# Patient Record
Sex: Female | Born: 1935 | Race: White | Hispanic: No | Smoking: Never smoker
Health system: Southern US, Community
[De-identification: ages and names within clinical notes are randomized; demographics above are authoritative.]

## PROBLEM LIST (undated history)

## (undated) DIAGNOSIS — I509 Heart failure, unspecified: Secondary | ICD-10-CM

## (undated) DIAGNOSIS — J45909 Unspecified asthma, uncomplicated: Secondary | ICD-10-CM

## (undated) DIAGNOSIS — M419 Scoliosis, unspecified: Secondary | ICD-10-CM

## (undated) DIAGNOSIS — R42 Dizziness and giddiness: Secondary | ICD-10-CM

## (undated) DIAGNOSIS — I4891 Unspecified atrial fibrillation: Secondary | ICD-10-CM

## (undated) DIAGNOSIS — I639 Cerebral infarction, unspecified: Secondary | ICD-10-CM

## (undated) DIAGNOSIS — I6529 Occlusion and stenosis of unspecified carotid artery: Secondary | ICD-10-CM

## (undated) DIAGNOSIS — D649 Anemia, unspecified: Secondary | ICD-10-CM

## (undated) DIAGNOSIS — F32A Depression, unspecified: Secondary | ICD-10-CM

## (undated) DIAGNOSIS — F0390 Unspecified dementia without behavioral disturbance: Secondary | ICD-10-CM

## (undated) DIAGNOSIS — R2689 Other abnormalities of gait and mobility: Secondary | ICD-10-CM

## (undated) DIAGNOSIS — I350 Nonrheumatic aortic (valve) stenosis: Secondary | ICD-10-CM

## (undated) DIAGNOSIS — I10 Essential (primary) hypertension: Secondary | ICD-10-CM

## (undated) DIAGNOSIS — F329 Major depressive disorder, single episode, unspecified: Secondary | ICD-10-CM

## (undated) DIAGNOSIS — N189 Chronic kidney disease, unspecified: Secondary | ICD-10-CM

## (undated) DIAGNOSIS — F419 Anxiety disorder, unspecified: Secondary | ICD-10-CM

## (undated) DIAGNOSIS — I219 Acute myocardial infarction, unspecified: Secondary | ICD-10-CM

## (undated) DIAGNOSIS — D509 Iron deficiency anemia, unspecified: Secondary | ICD-10-CM

## (undated) DIAGNOSIS — I2510 Atherosclerotic heart disease of native coronary artery without angina pectoris: Secondary | ICD-10-CM

## (undated) DIAGNOSIS — N289 Disorder of kidney and ureter, unspecified: Secondary | ICD-10-CM

## (undated) DIAGNOSIS — M1990 Unspecified osteoarthritis, unspecified site: Secondary | ICD-10-CM

## (undated) DIAGNOSIS — K219 Gastro-esophageal reflux disease without esophagitis: Secondary | ICD-10-CM

## (undated) DIAGNOSIS — H547 Unspecified visual loss: Secondary | ICD-10-CM

## (undated) HISTORY — PX: KNEE SURGERY: SHX244

## (undated) HISTORY — DX: Essential (primary) hypertension: I10

## (undated) HISTORY — DX: Atherosclerotic heart disease of native coronary artery without angina pectoris: I25.10

## (undated) HISTORY — PX: APPENDECTOMY: SHX54

## (undated) HISTORY — DX: Nonrheumatic aortic (valve) stenosis: I35.0

## (undated) HISTORY — DX: Dizziness and giddiness: R42

## (undated) HISTORY — DX: Occlusion and stenosis of unspecified carotid artery: I65.29

## (undated) HISTORY — DX: Chronic kidney disease, unspecified: N18.9

## (undated) HISTORY — PX: CORONARY ANGIOPLASTY: SHX604

## (undated) HISTORY — PX: PACEMAKER INSERTION: SHX728

## (undated) HISTORY — DX: Iron deficiency anemia, unspecified: D50.9

## (undated) HISTORY — DX: Disorder of kidney and ureter, unspecified: N28.9

## (undated) HISTORY — DX: Cerebral infarction, unspecified: I63.9

## (undated) HISTORY — PX: TOTAL ABDOMINAL HYSTERECTOMY: SHX209

## (undated) HISTORY — PX: CHOLECYSTECTOMY: SHX55

## (undated) HISTORY — DX: Anemia, unspecified: D64.9

## (undated) HISTORY — PX: CATARACT EXTRACTION: SUR2

---

## 1998-06-03 ENCOUNTER — Other Ambulatory Visit: Admission: RE | Admit: 1998-06-03 | Discharge: 1998-06-03 | Payer: Self-pay

## 1998-11-21 ENCOUNTER — Inpatient Hospital Stay (HOSPITAL_COMMUNITY): Admission: EM | Admit: 1998-11-21 | Discharge: 1998-11-23 | Payer: Self-pay | Admitting: Emergency Medicine

## 1998-11-21 ENCOUNTER — Encounter: Payer: Self-pay | Admitting: Cardiology

## 1998-12-09 ENCOUNTER — Encounter: Payer: Self-pay | Admitting: Emergency Medicine

## 1998-12-09 ENCOUNTER — Observation Stay (HOSPITAL_COMMUNITY): Admission: EM | Admit: 1998-12-09 | Discharge: 1998-12-10 | Payer: Self-pay | Admitting: Emergency Medicine

## 1998-12-10 ENCOUNTER — Encounter: Payer: Self-pay | Admitting: Cardiology

## 1999-01-10 ENCOUNTER — Encounter: Payer: Self-pay | Admitting: Emergency Medicine

## 1999-01-10 ENCOUNTER — Inpatient Hospital Stay (HOSPITAL_COMMUNITY): Admission: EM | Admit: 1999-01-10 | Discharge: 1999-01-15 | Payer: Self-pay | Admitting: Emergency Medicine

## 1999-01-11 ENCOUNTER — Encounter: Payer: Self-pay | Admitting: Pulmonary Disease

## 1999-06-01 ENCOUNTER — Encounter: Payer: Self-pay | Admitting: Emergency Medicine

## 1999-06-01 ENCOUNTER — Inpatient Hospital Stay (HOSPITAL_COMMUNITY): Admission: EM | Admit: 1999-06-01 | Discharge: 1999-06-06 | Payer: Self-pay | Admitting: Emergency Medicine

## 1999-06-08 ENCOUNTER — Encounter: Payer: Self-pay | Admitting: Emergency Medicine

## 1999-06-08 ENCOUNTER — Inpatient Hospital Stay (HOSPITAL_COMMUNITY): Admission: EM | Admit: 1999-06-08 | Discharge: 1999-06-09 | Payer: Self-pay | Admitting: Emergency Medicine

## 1999-06-09 ENCOUNTER — Encounter: Payer: Self-pay | Admitting: Cardiovascular Disease

## 1999-06-13 ENCOUNTER — Emergency Department (HOSPITAL_COMMUNITY): Admission: EM | Admit: 1999-06-13 | Discharge: 1999-06-13 | Payer: Self-pay | Admitting: Emergency Medicine

## 1999-06-13 ENCOUNTER — Encounter: Payer: Self-pay | Admitting: Emergency Medicine

## 1999-09-14 ENCOUNTER — Encounter: Payer: Self-pay | Admitting: Emergency Medicine

## 1999-09-14 ENCOUNTER — Inpatient Hospital Stay (HOSPITAL_COMMUNITY): Admission: EM | Admit: 1999-09-14 | Discharge: 1999-09-14 | Payer: Self-pay | Admitting: Emergency Medicine

## 2000-02-26 ENCOUNTER — Ambulatory Visit (HOSPITAL_COMMUNITY): Admission: RE | Admit: 2000-02-26 | Discharge: 2000-02-27 | Payer: Self-pay | Admitting: *Deleted

## 2000-02-29 ENCOUNTER — Inpatient Hospital Stay (HOSPITAL_COMMUNITY): Admission: EM | Admit: 2000-02-29 | Discharge: 2000-03-02 | Payer: Self-pay | Admitting: Emergency Medicine

## 2000-02-29 ENCOUNTER — Encounter: Payer: Self-pay | Admitting: Emergency Medicine

## 2000-03-26 ENCOUNTER — Encounter: Payer: Self-pay | Admitting: General Surgery

## 2000-03-30 ENCOUNTER — Encounter (INDEPENDENT_AMBULATORY_CARE_PROVIDER_SITE_OTHER): Payer: Self-pay | Admitting: Specialist

## 2000-03-30 ENCOUNTER — Encounter: Payer: Self-pay | Admitting: General Surgery

## 2000-03-30 ENCOUNTER — Ambulatory Visit (HOSPITAL_COMMUNITY): Admission: RE | Admit: 2000-03-30 | Discharge: 2000-03-31 | Payer: Self-pay | Admitting: General Surgery

## 2000-08-05 ENCOUNTER — Encounter: Payer: Self-pay | Admitting: *Deleted

## 2000-08-05 ENCOUNTER — Inpatient Hospital Stay (HOSPITAL_COMMUNITY): Admission: EM | Admit: 2000-08-05 | Discharge: 2000-08-07 | Payer: Self-pay | Admitting: Podiatry

## 2001-01-15 ENCOUNTER — Inpatient Hospital Stay (HOSPITAL_COMMUNITY): Admission: EM | Admit: 2001-01-15 | Discharge: 2001-01-18 | Payer: Self-pay | Admitting: Emergency Medicine

## 2001-01-15 ENCOUNTER — Encounter: Payer: Self-pay | Admitting: Emergency Medicine

## 2001-01-17 ENCOUNTER — Encounter: Payer: Self-pay | Admitting: *Deleted

## 2001-05-25 ENCOUNTER — Emergency Department (HOSPITAL_COMMUNITY): Admission: EM | Admit: 2001-05-25 | Discharge: 2001-05-25 | Payer: Self-pay | Admitting: *Deleted

## 2001-05-25 ENCOUNTER — Encounter: Payer: Self-pay | Admitting: *Deleted

## 2001-07-19 ENCOUNTER — Ambulatory Visit (HOSPITAL_COMMUNITY): Admission: RE | Admit: 2001-07-19 | Discharge: 2001-07-19 | Payer: Self-pay | Admitting: Gastroenterology

## 2001-07-19 ENCOUNTER — Encounter (INDEPENDENT_AMBULATORY_CARE_PROVIDER_SITE_OTHER): Payer: Self-pay | Admitting: Specialist

## 2001-09-17 ENCOUNTER — Emergency Department (HOSPITAL_COMMUNITY): Admission: EM | Admit: 2001-09-17 | Discharge: 2001-09-17 | Payer: Self-pay | Admitting: Emergency Medicine

## 2001-09-17 ENCOUNTER — Inpatient Hospital Stay (HOSPITAL_COMMUNITY): Admission: AD | Admit: 2001-09-17 | Discharge: 2001-09-23 | Payer: Self-pay | Admitting: Cardiovascular Disease

## 2001-09-17 ENCOUNTER — Encounter: Payer: Self-pay | Admitting: Emergency Medicine

## 2002-01-21 ENCOUNTER — Encounter: Payer: Self-pay | Admitting: *Deleted

## 2002-01-21 ENCOUNTER — Emergency Department (HOSPITAL_COMMUNITY): Admission: EM | Admit: 2002-01-21 | Discharge: 2002-01-21 | Payer: Self-pay | Admitting: *Deleted

## 2002-05-06 ENCOUNTER — Encounter: Payer: Self-pay | Admitting: Emergency Medicine

## 2002-05-06 ENCOUNTER — Inpatient Hospital Stay (HOSPITAL_COMMUNITY): Admission: EM | Admit: 2002-05-06 | Discharge: 2002-05-10 | Payer: Self-pay | Admitting: Emergency Medicine

## 2002-05-07 ENCOUNTER — Encounter: Payer: Self-pay | Admitting: Internal Medicine

## 2002-05-08 ENCOUNTER — Encounter: Payer: Self-pay | Admitting: Internal Medicine

## 2002-05-09 ENCOUNTER — Encounter: Payer: Self-pay | Admitting: Internal Medicine

## 2002-07-04 ENCOUNTER — Ambulatory Visit (HOSPITAL_COMMUNITY): Admission: RE | Admit: 2002-07-04 | Discharge: 2002-07-04 | Payer: Self-pay | Admitting: Internal Medicine

## 2002-07-04 ENCOUNTER — Encounter: Payer: Self-pay | Admitting: Internal Medicine

## 2002-10-14 ENCOUNTER — Encounter: Payer: Self-pay | Admitting: *Deleted

## 2002-10-14 ENCOUNTER — Inpatient Hospital Stay (HOSPITAL_COMMUNITY): Admission: EM | Admit: 2002-10-14 | Discharge: 2002-10-15 | Payer: Self-pay | Admitting: Emergency Medicine

## 2003-09-05 ENCOUNTER — Emergency Department (HOSPITAL_COMMUNITY): Admission: EM | Admit: 2003-09-05 | Discharge: 2003-09-06 | Payer: Self-pay | Admitting: Emergency Medicine

## 2003-09-06 ENCOUNTER — Encounter: Payer: Self-pay | Admitting: Emergency Medicine

## 2003-12-08 HISTORY — PX: COLONOSCOPY: SHX174

## 2004-07-28 ENCOUNTER — Ambulatory Visit (HOSPITAL_COMMUNITY): Admission: RE | Admit: 2004-07-28 | Discharge: 2004-07-28 | Payer: Self-pay | Admitting: Neurology

## 2004-09-12 ENCOUNTER — Ambulatory Visit (HOSPITAL_COMMUNITY): Admission: RE | Admit: 2004-09-12 | Discharge: 2004-09-12 | Payer: Self-pay | Admitting: Gastroenterology

## 2004-10-20 ENCOUNTER — Other Ambulatory Visit: Admission: RE | Admit: 2004-10-20 | Discharge: 2004-10-20 | Payer: Self-pay | Admitting: Family Medicine

## 2004-12-07 HISTORY — PX: CORONARY ARTERY BYPASS GRAFT: SHX141

## 2005-02-25 ENCOUNTER — Ambulatory Visit: Payer: Self-pay | Admitting: Internal Medicine

## 2005-02-25 ENCOUNTER — Inpatient Hospital Stay (HOSPITAL_COMMUNITY): Admission: EM | Admit: 2005-02-25 | Discharge: 2005-02-28 | Payer: Self-pay | Admitting: Emergency Medicine

## 2005-03-09 ENCOUNTER — Emergency Department (HOSPITAL_COMMUNITY): Admission: EM | Admit: 2005-03-09 | Discharge: 2005-03-09 | Payer: Self-pay | Admitting: Emergency Medicine

## 2005-03-13 ENCOUNTER — Inpatient Hospital Stay (HOSPITAL_COMMUNITY): Admission: RE | Admit: 2005-03-13 | Discharge: 2005-03-20 | Payer: Self-pay | Admitting: Cardiothoracic Surgery

## 2005-04-13 ENCOUNTER — Encounter: Admission: RE | Admit: 2005-04-13 | Discharge: 2005-05-05 | Payer: Self-pay | Admitting: Orthopedic Surgery

## 2005-04-13 ENCOUNTER — Ambulatory Visit: Payer: Self-pay | Admitting: *Deleted

## 2005-05-04 ENCOUNTER — Emergency Department (HOSPITAL_COMMUNITY): Admission: EM | Admit: 2005-05-04 | Discharge: 2005-05-04 | Payer: Self-pay | Admitting: Emergency Medicine

## 2005-05-21 ENCOUNTER — Encounter: Admission: RE | Admit: 2005-05-21 | Discharge: 2005-05-21 | Payer: Self-pay | Admitting: Cardiothoracic Surgery

## 2005-06-27 ENCOUNTER — Emergency Department (HOSPITAL_COMMUNITY): Admission: EM | Admit: 2005-06-27 | Discharge: 2005-06-27 | Payer: Self-pay | Admitting: Emergency Medicine

## 2005-08-17 ENCOUNTER — Ambulatory Visit: Payer: Self-pay | Admitting: Cardiology

## 2006-07-28 ENCOUNTER — Ambulatory Visit: Payer: Self-pay | Admitting: Cardiology

## 2006-08-30 ENCOUNTER — Ambulatory Visit: Payer: Self-pay

## 2007-09-21 ENCOUNTER — Ambulatory Visit: Payer: Self-pay | Admitting: Cardiology

## 2007-12-08 ENCOUNTER — Emergency Department (HOSPITAL_COMMUNITY): Admission: EM | Admit: 2007-12-08 | Discharge: 2007-12-08 | Payer: Self-pay | Admitting: Emergency Medicine

## 2008-04-19 ENCOUNTER — Inpatient Hospital Stay (HOSPITAL_COMMUNITY): Admission: EM | Admit: 2008-04-19 | Discharge: 2008-04-20 | Payer: Self-pay | Admitting: Emergency Medicine

## 2008-10-24 ENCOUNTER — Ambulatory Visit: Payer: Self-pay | Admitting: Cardiology

## 2008-11-08 ENCOUNTER — Ambulatory Visit: Payer: Self-pay

## 2008-12-20 ENCOUNTER — Ambulatory Visit (HOSPITAL_COMMUNITY): Admission: RE | Admit: 2008-12-20 | Discharge: 2008-12-20 | Payer: Self-pay | Admitting: Family Medicine

## 2009-01-14 ENCOUNTER — Ambulatory Visit (HOSPITAL_COMMUNITY): Admission: RE | Admit: 2009-01-14 | Discharge: 2009-01-14 | Payer: Self-pay | Admitting: Family Medicine

## 2009-08-14 ENCOUNTER — Encounter (INDEPENDENT_AMBULATORY_CARE_PROVIDER_SITE_OTHER): Payer: Self-pay | Admitting: *Deleted

## 2009-08-15 ENCOUNTER — Encounter
Admission: RE | Admit: 2009-08-15 | Discharge: 2009-08-27 | Payer: Self-pay | Admitting: Physical Medicine & Rehabilitation

## 2009-08-19 ENCOUNTER — Ambulatory Visit: Payer: Self-pay | Admitting: Physical Medicine & Rehabilitation

## 2009-08-22 ENCOUNTER — Ambulatory Visit: Payer: Self-pay | Admitting: Physical Medicine & Rehabilitation

## 2009-10-08 DIAGNOSIS — Z794 Long term (current) use of insulin: Secondary | ICD-10-CM

## 2009-10-08 DIAGNOSIS — I2510 Atherosclerotic heart disease of native coronary artery without angina pectoris: Secondary | ICD-10-CM | POA: Insufficient documentation

## 2009-10-08 DIAGNOSIS — I10 Essential (primary) hypertension: Secondary | ICD-10-CM

## 2009-10-08 DIAGNOSIS — E785 Hyperlipidemia, unspecified: Secondary | ICD-10-CM

## 2009-10-08 DIAGNOSIS — I6529 Occlusion and stenosis of unspecified carotid artery: Secondary | ICD-10-CM

## 2009-10-08 DIAGNOSIS — E119 Type 2 diabetes mellitus without complications: Secondary | ICD-10-CM

## 2009-10-09 ENCOUNTER — Ambulatory Visit: Payer: Self-pay | Admitting: Cardiology

## 2009-11-25 ENCOUNTER — Telehealth (INDEPENDENT_AMBULATORY_CARE_PROVIDER_SITE_OTHER): Payer: Self-pay | Admitting: *Deleted

## 2009-11-26 ENCOUNTER — Encounter: Payer: Self-pay | Admitting: Cardiology

## 2009-11-27 ENCOUNTER — Encounter: Payer: Self-pay | Admitting: Cardiovascular Disease

## 2009-11-27 ENCOUNTER — Ambulatory Visit: Payer: Self-pay

## 2010-01-24 ENCOUNTER — Ambulatory Visit: Payer: Self-pay | Admitting: Oncology

## 2010-01-28 LAB — CBC & DIFF AND RETIC
Basophils Absolute: 0 10*3/uL (ref 0.0–0.1)
Eosinophils Absolute: 0.1 10*3/uL (ref 0.0–0.5)
HGB: 8.6 g/dL — ABNORMAL LOW (ref 11.6–15.9)
Immature Retic Fract: 22.7 % — ABNORMAL HIGH (ref 0.00–10.70)
MCV: 86.3 fL (ref 79.5–101.0)
MONO%: 3.4 % (ref 0.0–14.0)
NEUT#: 7.4 10*3/uL — ABNORMAL HIGH (ref 1.5–6.5)
RBC: 3.14 10*6/uL — ABNORMAL LOW (ref 3.70–5.45)
RDW: 16.2 % — ABNORMAL HIGH (ref 11.2–14.5)
Retic %: 1.3 % (ref 0.50–1.50)
Retic Ct Abs: 40.82 10*3/uL (ref 18.30–72.70)
WBC: 8.8 10*3/uL (ref 3.9–10.3)
lymph#: 1 10*3/uL (ref 0.9–3.3)

## 2010-01-28 LAB — CHCC SMEAR

## 2010-01-28 LAB — MORPHOLOGY: PLT EST: INCREASED

## 2010-01-30 LAB — COMPREHENSIVE METABOLIC PANEL
ALT: 19 U/L (ref 0–35)
AST: 18 U/L (ref 0–37)
Albumin: 2.8 g/dL — ABNORMAL LOW (ref 3.5–5.2)
Alkaline Phosphatase: 57 U/L (ref 39–117)
BUN: 14 mg/dL (ref 6–23)
Creatinine, Ser: 1.01 mg/dL (ref 0.40–1.20)
Potassium: 4.2 mEq/L (ref 3.5–5.3)

## 2010-01-30 LAB — VITAMIN B12: Vitamin B-12: 1272 pg/mL — ABNORMAL HIGH (ref 211–911)

## 2010-01-30 LAB — IRON AND TIBC
TIBC: 212 ug/dL — ABNORMAL LOW (ref 250–470)
UIBC: 186 ug/dL

## 2010-01-30 LAB — FERRITIN: Ferritin: 195 ng/mL (ref 10–291)

## 2010-01-30 LAB — FOLATE RBC: RBC Folate: 1209 ng/mL — ABNORMAL HIGH (ref 180–600)

## 2010-01-30 LAB — IMMUNOFIXATION ELECTROPHORESIS: IgM, Serum: 123 mg/dL (ref 60–263)

## 2010-01-30 LAB — SEDIMENTATION RATE: Sed Rate: 106 mm/hr — ABNORMAL HIGH (ref 0–22)

## 2010-02-03 ENCOUNTER — Encounter (INDEPENDENT_AMBULATORY_CARE_PROVIDER_SITE_OTHER): Payer: Self-pay | Admitting: Internal Medicine

## 2010-02-03 ENCOUNTER — Inpatient Hospital Stay (HOSPITAL_COMMUNITY): Admission: EM | Admit: 2010-02-03 | Discharge: 2010-02-07 | Payer: Self-pay | Admitting: Emergency Medicine

## 2010-02-03 ENCOUNTER — Ambulatory Visit: Payer: Self-pay | Admitting: Cardiology

## 2010-02-04 ENCOUNTER — Ambulatory Visit: Payer: Self-pay | Admitting: Oncology

## 2010-02-21 ENCOUNTER — Telehealth: Payer: Self-pay | Admitting: Cardiology

## 2010-03-10 ENCOUNTER — Ambulatory Visit (HOSPITAL_COMMUNITY): Admission: RE | Admit: 2010-03-10 | Discharge: 2010-03-10 | Payer: Self-pay | Admitting: Ophthalmology

## 2010-03-11 ENCOUNTER — Ambulatory Visit (HOSPITAL_COMMUNITY): Payer: Self-pay | Admitting: Oncology

## 2010-03-11 ENCOUNTER — Encounter (HOSPITAL_COMMUNITY): Admission: RE | Admit: 2010-03-11 | Discharge: 2010-04-10 | Payer: Self-pay | Admitting: Oncology

## 2010-03-27 ENCOUNTER — Encounter: Payer: Self-pay | Admitting: Cardiology

## 2010-04-02 ENCOUNTER — Ambulatory Visit: Payer: Self-pay | Admitting: Cardiology

## 2010-04-02 DIAGNOSIS — I5032 Chronic diastolic (congestive) heart failure: Secondary | ICD-10-CM | POA: Insufficient documentation

## 2010-04-14 ENCOUNTER — Ambulatory Visit (HOSPITAL_COMMUNITY): Admission: RE | Admit: 2010-04-14 | Discharge: 2010-04-14 | Payer: Self-pay | Admitting: Nephrology

## 2010-05-13 ENCOUNTER — Encounter (HOSPITAL_COMMUNITY): Admission: RE | Admit: 2010-05-13 | Discharge: 2010-06-12 | Payer: Self-pay | Admitting: Oncology

## 2010-05-13 ENCOUNTER — Ambulatory Visit (HOSPITAL_COMMUNITY): Payer: Self-pay | Admitting: Oncology

## 2010-05-28 ENCOUNTER — Encounter: Payer: Self-pay | Admitting: Cardiology

## 2010-07-07 ENCOUNTER — Ambulatory Visit (HOSPITAL_COMMUNITY): Admission: RE | Admit: 2010-07-07 | Discharge: 2010-07-07 | Payer: Self-pay | Admitting: Ophthalmology

## 2010-10-15 ENCOUNTER — Ambulatory Visit: Payer: Self-pay | Admitting: Cardiology

## 2010-11-11 ENCOUNTER — Encounter (HOSPITAL_COMMUNITY)
Admission: RE | Admit: 2010-11-11 | Discharge: 2010-12-11 | Payer: Self-pay | Source: Home / Self Care | Attending: Oncology | Admitting: Oncology

## 2010-11-11 ENCOUNTER — Ambulatory Visit (HOSPITAL_COMMUNITY): Payer: Self-pay | Admitting: Oncology

## 2010-11-26 ENCOUNTER — Encounter: Payer: Self-pay | Admitting: Cardiology

## 2010-11-28 ENCOUNTER — Ambulatory Visit: Payer: Self-pay

## 2010-11-28 ENCOUNTER — Encounter: Payer: Self-pay | Admitting: Cardiology

## 2010-12-28 ENCOUNTER — Encounter (HOSPITAL_COMMUNITY): Payer: Self-pay | Admitting: Oncology

## 2011-01-06 NOTE — Miscellaneous (Signed)
  Clinical Lists Changes  Observations: Added new observation of US CAROTID: Essentially stable, mild carotid disease The bilateral mild to distal ICA's are torturous 40-59% RICA stenosis XX123456 LICA stenosis Atypical flow to the right vetebral artery.  F/U 1 year  (11/27/2009 11:22)      Carotid Doppler  Procedure date:  11/27/2009  Findings:      Essentially stable, mild carotid disease The bilateral mild to distal ICA's are torturous 40-59% RICA stenosis [id number] LICA stenosis Atypical flow to the right vetebral artery.  F/U 1 year

## 2011-01-06 NOTE — Assessment & Plan Note (Signed)
Summary: [name]  Medications Added FERROUS FUMARATE 325 (106 FE) MG TABS (FERROUS FUMARATE) 1 by mouth dialy LISINOPRIL 5 MG TABS (LISINOPRIL) 1 by mouth daily DULCOLAX 5 MG TBEC (BISACODYL) 1 by mouth dialy      Allergies Added:   Visit Type:  Follow-up Primary Provider:  [name], NP  CC:  CHF.  History of Present Illness: The patient presents for followup of a hospitalization for management of dyspnea. She was thought to have evidence of heart failure with diastolic dysfunction. She was sent home on diuretics but since has had this discontinued because of renal insufficiency. She is now weighing herself daily and avoiding salt. She reports that she is doing quite well. She is having no acute dyspnea and denies any PND or orthopnea. She is having no palpitations, presyncope or syncope. She has trace lower extremity edema.  Current Medications (verified): 1)  Amlodipine Besylate 5 Mg Tabs (Amlodipine Besylate) .Marland Kitchen.. 1 and 1/2 By Mouth Daily 2)  Humulin 70/30 70-30 % Susp (Insulin Isophane & Regular) .... As Directed 3)  Plavix 75 Mg Tabs (Clopidogrel Bisulfate) .Marland Kitchen.. 1 By Mouth Daily 4)  Aspirin 81 Mg  Tabs (Aspirin) .Marland Kitchen.. 1 By Mouth Daily 5)  Multivitamins   Tabs (Multiple Vitamin) .Marland Kitchen.. 1 By Mouth Daily 6)  Vytorin 10-40 Mg Tabs (Ezetimibe-Simvastatin) .Marland Kitchen.. 1 By Mouth Daily 7)  Metoprolol Tartrate 50 Mg Tabs (Metoprolol Tartrate) .Marland Kitchen.. 1 By Mouth Two Times A Day 8)  Paxil 10 Mg Tabs (Paroxetine Hcl) .Marland Kitchen.. 1 By Mouth Daily 9)  Nitrostat 0.4 Mg Subl (Nitroglycerin) .... As Needed 10)  Ferrous Fumarate 325 (106 Fe) Mg Tabs (Ferrous Fumarate) .Marland Kitchen.. 1 By Mouth Dialy 11)  Lisinopril 5 Mg Tabs (Lisinopril) .Marland Kitchen.. 1 By Mouth Daily 12)  Dulcolax 5 Mg Tbec (Bisacodyl) .Marland Kitchen.. 1 By Mouth Dialy  Allergies (verified): 1)  ! Darvocet  Past History:  Past Medical History: 1. Coronary artery disease status post CABG in April 2006 (LIMA to the       LAD, SVG to circumflex, SVG to  PDA).   2. Well-preserved ejection fraction.   3. Carotid stenosis (40-59% right, 0-39% left, last study in Dec 10)  4. Diabetes mellitus.   5. Hypertension.   6. Dyslipidemia.   7. Heart failure with a well-preserved ejection fraction  Past Surgical History:  Hysterectomy.   Cholecystectomy.   Cataract surgery  Review of Systems       As stated in the HPI and negative for all other systems.   Vital Signs:  Patient profile:   77 year old female Height:      66 inches Weight:      189 pounds BMI:     30.62 Pulse rate:   62 / minute Resp:     16 per minute BP sitting:   128 / 80  (right arm)  Vitals Entered By: Levora Angel, CNA (April 02, 2010 1:44 PM)  Physical Exam  General:  Well developed, well nourished, in no acute distress. Head:  normocephalic and atraumatic Eyes:  PERRLA/EOM intact; conjunctiva and lids normal. Mouth:  Edentulous gums and palate normal. Oral mucosa normal. Neck:  Neck supple, no JVD. No masses, thyromegaly or abnormal cervical nodes. Chest Wall:  well-healed sternotomy scar Heart:  Non-displaced PMI, chest non-tender; regular rate and rhythm, S1, S2 without murmurs, rubs or gallops. Carotid upstroke normal, no bruit. Normal abdominal aortic size, no bruits. Femorals normal pulses, diminished pedal pulses bilaterally. No  varicosities. Abdomen:  Bowel sounds  positive; abdomen soft and non-tender without masses, organomegaly, or hernias noted. No hepatosplenomegaly. Msk:  Back normal, normal gait. Muscle strength and tone normal. Extremities:  trace left pedal edema and trace right pedal edema.   Neurologic:  Alert and oriented x 3. Skin:  Intact without lesions or rashes. Psych:  Normal affect.   EKG  Procedure date:  04/02/2010  Findings:      sinus rhythm, rate 62, axis within normal limits, intervals within normal limits, inferolateral T-wave inversions present on previous.  Impression & Recommendations:  Problem # 1:  CAD  (ICD-414.00) The patient has had no new symptoms. She will continue with risk reduction. Orders: EKG w/ Interpretation (93000)  Problem # 2:  ACUTE DIASTOLIC HEART FAILURE (99991111) The patient seems to be euvolemic. She will continue on the meds as listed with careful daily weights and salt restriction.  Problem # 3:  HYPERTENSION (ICD-401.9) The patient's blood pressure is well controlled. She will continue on the meds as listed.  Patient Instructions: 1)  Your physician recommends that you schedule a follow-up appointment in: 6 MONTHS 2)  Your physician recommends that you continue on your current medications as directed. Please refer to the Current Medication list given to you today. 3)  You have been diagnosed with Congestive Heart Failure or CHF.  CHF is a condition in which a problem with the structure or function of the heart impairs its ability to supply sufficient blood flow to meet the body's needs.  For further information please visit www.cardiosmart.org for detailed information on CHF.

## 2011-01-06 NOTE — Miscellaneous (Signed)
  Clinical Lists Changes  Observations: Added new observation of ECHOINTERP:  - Left ventricle: The cavity size was normal. Wall thickness was     increased increased in a pattern of mild to moderate LVH. Systolic     function was hyperdynamic. The estimated ejection fraction was     75%. Wall motion was normal; there were no regional wall motion     abnormalities.   - Aortic valve: Mildly to moderately calcified annulus. Trileaflet;     mildly thickened leaflets.   - Mitral valve: Moderately calcified annulus.   - Left atrium: The atrium was mildly dilated. (02/03/2010 11:23)      Echocardiogram  Procedure date:  02/03/2010  Findings:       - Left ventricle: The cavity size was normal. Wall thickness was     increased increased in a pattern of mild to moderate LVH. Systolic     function was hyperdynamic. The estimated ejection fraction was     75%. Wall motion was normal; there were no regional wall motion     abnormalities.   - Aortic valve: Mildly to moderately calcified annulus. Trileaflet;     mildly thickened leaflets.   - Mitral valve: Moderately calcified annulus.   - Left atrium: The atrium was mildly dilated.

## 2011-01-06 NOTE — Assessment & Plan Note (Signed)
Summary: [facility]  Medications Added LIPITOR 20 MG TABS (ATORVASTATIN CALCIUM) 1 by mouth daily DICLOFENAC SODIUM 75 MG TBEC (DICLOFENAC SODIUM) as needed      Allergies Added:   Visit Type:  Follow-up Primary Adelheid Hoggard:  [name], NP  CC:  CAD.  History of Present Illness: The patient presents for a six-month followup after hospitalization for heart failure earlier in the year. Since then she has been much more attention to volume and salt. She has had no acute shortness of breath and she denies any PND or orthopnea. She has had no chest pressure, neck or arm discomfort. She had minimal lower extremity edema and her weights have been stable.  Current Medications (verified): 1)  Amlodipine Besylate 5 Mg Tabs (Amlodipine Besylate) .Marland Kitchen.. 1 and 1/2 By Mouth Daily 2)  Humulin 70/30 70-30 % Susp (Insulin Isophane & Regular) .... As Directed 3)  Plavix 75 Mg Tabs (Clopidogrel Bisulfate) .Marland Kitchen.. 1 By Mouth Daily 4)  Aspirin 81 Mg  Tabs (Aspirin) .Marland Kitchen.. 1 By Mouth Daily 5)  Multivitamins   Tabs (Multiple Vitamin) .Marland Kitchen.. 1 By Mouth Daily 6)  Metoprolol Tartrate 50 Mg Tabs (Metoprolol Tartrate) .Marland Kitchen.. 1 By Mouth Two Times A Day 7)  Paxil 10 Mg Tabs (Paroxetine Hcl) .Marland Kitchen.. 1 By Mouth Daily 8)  Nitrostat 0.4 Mg Subl (Nitroglycerin) .... As Needed 9)  Ferrous Fumarate 325 (106 Fe) Mg Tabs (Ferrous Fumarate) .Marland Kitchen.. 1 By Mouth Dialy 10)  Lisinopril 5 Mg Tabs (Lisinopril) .Marland Kitchen.. 1 By Mouth Daily 11)  Dulcolax 5 Mg Tbec (Bisacodyl) .Marland Kitchen.. 1 By Mouth Dialy 12)  Lipitor 20 Mg Tabs (Atorvastatin Calcium) .Marland Kitchen.. 1 By Mouth Daily 13)  Diclofenac Sodium 75 Mg Tbec (Diclofenac Sodium) .... As Needed  Allergies (verified): 1)  ! Darvocet  Past History:  Past Medical History: Reviewed history from 04/02/2010 and no changes required. 1. Coronary artery disease status post CABG in April 2006 (LIMA to the       LAD, SVG to circumflex, SVG to PDA).   2. Well-preserved ejection fraction.   3. Carotid stenosis  (40-59% right, 0-39% left, last study in Dec 10)  4. Diabetes mellitus.   5. Hypertension.   6. Dyslipidemia.   7. Heart failure with a well-preserved ejection fraction  Past Surgical History: Reviewed history from 04/02/2010 and no changes required.  Hysterectomy.   Cholecystectomy.   Cataract surgery  Review of Systems       As stated in the HPI and negative for all other systems.   Vital Signs:  Patient profile:   77 year old female Height:      66 inches Weight:      192 pounds BMI:     31.10 Pulse rate:   55 / minute Resp:     18 per minute BP sitting:   148 / 72  (right arm)  Vitals Entered By: Levora Angel, CNA (October 15, 2010 9:14 AM)  Physical Exam  General:  Well developed, well nourished, in no acute distress. Head:  normocephalic and atraumatic Mouth:  Edentulous gums and palate normal. Oral mucosa normal. Neck:  Neck supple, no JVD. No masses, thyromegaly or abnormal cervical nodes. Chest Wall:  well-healed sternotomy scar Lungs:  Clear bilaterally to auscultation and percussion. Heart:  Non-displaced PMI, chest non-tender; regular rate and rhythm, S1, S2, soft systolic murmur, rubs or gallops. Carotid upstroke normal, no bruit. Normal abdominal aortic size, no bruits. Femorals normal pulses, diminished pedal pulses bilaterally. No  varicosities. Abdomen:  Bowel  sounds positive; abdomen soft and non-tender without masses, organomegaly, or hernias noted. No hepatosplenomegaly. Msk:  Back normal, normal gait. Muscle strength and tone normal. Extremities:  No clubbing or cyanosis. Neurologic:  Alert and oriented x 3. Skin:  Intact without lesions or rashes. Cervical Nodes:  no significant adenopathy Inguinal Nodes:  no significant adenopathy Psych:  Normal affect.   EKG  Procedure date:  10/15/2010  Findings:      Sus rhythm, rate 55, axis within normal limits, intervals within normal limits, inferolateral inversions  Impression &  Recommendations:  Problem # 1:  ACUTE DIASTOLIC HEART FAILURE (99991111) She seems to be euvolemic. She seems to be dissipating better in health management. I will make no changes to her regimen.  Problem # 2:  HYPERTENSION (ICD-401.9) Her blood pressure is slightly elevated today but it has been better controlled at other office appts. I reviewed these readings.  I will not suggest any med changes.  Problem # 3:  CAD (ICD-414.00) I will make no change to her medical regimine.  She will participate in risk reduction per her primary Bijou Easler. Orders: EKG w/ Interpretation (93000)  Patient Instructions: 1)  Your physician recommends that you schedule a follow-up appointment in: 12 months with Dr Percival Spanish in [place] 2)  Your physician recommends that you continue on your current medications as directed. Please refer to the Current Medication list given to you today.

## 2011-01-06 NOTE — Progress Notes (Signed)
Summary: OK TO HOLD PLAVIX  LM TO CB  PFH,RN 11AM 3/23   Phone Note Call from Patient   Caller: Mom Summary of Call: PT HAVING SURGERY AND NEED TO STOP PLAVIX SURGERY IS 03/10/10 (EYE SURGERY) Initial call taken by: Delsa Sale,  February 21, 2010 11:12 AM  Follow-up for Phone Call        pts daughter aware Dr Percival Spanish will be in office on Mon 3/21 to review Kevan Rosebush, RN  February 21, 2010 11:59 AM   Additional Follow-up for Phone Call Additional follow up Details #1::        OK to hold Plavix.    Additional Follow-up for Phone Call Additional follow up Details #2::    pt calling again, request call back Darnell Level  February 25, 2010 4:41 PM   Additional Follow-up for Phone Call Additional follow up Details #3:: Details for Additional Follow-up Action Taken: Attempted to call patient back again...NA at home phone.  pt calling, Darnell Level  February 26, 2010 11:39 AM   Sister Alene aware OK to hold plavix and restart after surgery  Marcene Corning Additional Follow-up by: Georgetta Haber RN

## 2011-01-08 NOTE — Miscellaneous (Signed)
Summary: Orders Update  Clinical Lists Changes  Orders: Added new Test order of Carotid Duplex (Carotid Duplex) - Signed 

## 2011-01-09 NOTE — Cardiovascular Report (Signed)
Summary: Heart Failure Program Summary Report   Heart Failure Program Summary Report   Imported By: Sallee Provencal 06/18/2010 10:57:51  _____________________________________________________________________  External Attachment:    Type:   Image     Comment:   External Document

## 2011-02-16 LAB — IRON AND TIBC
Saturation Ratios: 27 % (ref 20–55)
TIBC: 305 ug/dL (ref 250–470)

## 2011-02-16 LAB — RETICULOCYTES
RBC.: 4.05 MIL/uL (ref 3.87–5.11)
Retic Ct Pct: 0.7 % (ref 0.4–3.1)

## 2011-02-16 LAB — CBC
HCT: 35 % — ABNORMAL LOW (ref 36.0–46.0)
Platelets: 167 10*3/uL (ref 150–400)
RDW: 13.5 % (ref 11.5–15.5)
WBC: 5.5 10*3/uL (ref 4.0–10.5)

## 2011-02-20 LAB — GLUCOSE, CAPILLARY: Glucose-Capillary: 112 mg/dL — ABNORMAL HIGH (ref 70–99)

## 2011-02-21 LAB — BASIC METABOLIC PANEL
BUN: 20 mg/dL (ref 6–23)
CO2: 27 mEq/L (ref 19–32)
Calcium: 9.2 mg/dL (ref 8.4–10.5)
Chloride: 104 mEq/L (ref 96–112)
Creatinine, Ser: 0.92 mg/dL (ref 0.4–1.2)

## 2011-02-23 LAB — CBC
HCT: 33.6 % — ABNORMAL LOW (ref 36.0–46.0)
Hemoglobin: 11.3 g/dL — ABNORMAL LOW (ref 12.0–15.0)
MCHC: 33.6 g/dL (ref 30.0–36.0)
Platelets: 159 10*3/uL (ref 150–400)
RDW: 14.3 % (ref 11.5–15.5)

## 2011-02-25 LAB — BASIC METABOLIC PANEL
BUN: 15 mg/dL (ref 6–23)
CO2: 30 mEq/L (ref 19–32)
CO2: 26 mEq/L (ref 19–32)
Chloride: 97 mEq/L (ref 96–112)
Chloride: 104 mEq/L (ref 96–112)
GFR calc non Af Amer: 53 mL/min — ABNORMAL LOW (ref >60)
Glucose, Bld: 370 mg/dL — ABNORMAL HIGH (ref 70–99)
Glucose, Bld: 92 mg/dL (ref 70–99)
Potassium: 5.3 mEq/L — ABNORMAL HIGH (ref 3.5–5.1)
Potassium: 4.5 mEq/L (ref 3.5–5.1)
Sodium: 137 mEq/L (ref 135–145)

## 2011-02-25 LAB — VITAMIN B12: Vitamin B-12: 1005 pg/mL — ABNORMAL HIGH (ref 211–911)

## 2011-02-25 LAB — CBC
HCT: 27.2 % — ABNORMAL LOW (ref 36.0–46.0)
HCT: 31.4 % — ABNORMAL LOW (ref 36.0–46.0)
Hemoglobin: 10.6 g/dL — ABNORMAL LOW (ref 12.0–15.0)
MCHC: 31.2 g/dL (ref 30.0–36.0)
MCHC: 33.9 g/dL (ref 30.0–36.0)
MCV: 80.2 fL (ref 78.0–100.0)
Platelets: 320 10*3/uL (ref 150–400)
RDW: 16.1 % — ABNORMAL HIGH (ref 11.5–15.5)
RDW: 15.3 % (ref 11.5–15.5)

## 2011-02-25 LAB — POCT CARDIAC MARKERS
CKMB, poc: <1 ng/mL — ABNORMAL LOW (ref 1.0–8.0)
Myoglobin, poc: 76 ng/mL (ref 12–200)

## 2011-02-25 LAB — URINALYSIS, ROUTINE W REFLEX MICROSCOPIC
Ketones, ur: NEGATIVE mg/dL
Nitrite: NEGATIVE
Protein, ur: NEGATIVE mg/dL
pH: 6 (ref 5.0–8.0)

## 2011-02-25 LAB — GLUCOSE, CAPILLARY
Glucose-Capillary: 322 mg/dL — ABNORMAL HIGH (ref 70–99)
Glucose-Capillary: 339 mg/dL — ABNORMAL HIGH (ref 70–99)
Glucose-Capillary: 342 mg/dL — ABNORMAL HIGH (ref 70–99)

## 2011-02-25 LAB — CARDIAC PANEL(CRET KIN+CKTOT+MB+TROPI)
Relative Index: INVALID (ref 0.0–2.5)
Relative Index: INVALID (ref 0.0–2.5)
Troponin I: 0.01 ng/mL (ref 0.00–0.06)
Troponin I: <0.01 ng/mL (ref 0.00–0.06)

## 2011-02-25 LAB — CROSSMATCH

## 2011-02-25 LAB — CK TOTAL AND CKMB (NOT AT ARMC): CK, MB: 1.3 ng/mL (ref 0.3–4.0)

## 2011-02-25 LAB — DIFFERENTIAL
Basophils Absolute: 0 10*3/uL (ref 0.0–0.1)
Basophils Relative: 0 % (ref 0–1)
Eosinophils Absolute: 0.1 10*3/uL (ref 0.0–0.7)
Eosinophils Relative: 0 % (ref 0–5)
Lymphs Abs: 0.9 10*3/uL (ref 0.7–4.0)

## 2011-02-25 LAB — CULTURE, BLOOD (ROUTINE X 2): Culture: NO GROWTH

## 2011-02-25 LAB — ABO/RH: ABO/RH(D): A POS

## 2011-02-25 LAB — IRON AND TIBC
Iron: 11 ug/dL — ABNORMAL LOW (ref 42–135)
UIBC: 212 ug/dL

## 2011-02-25 LAB — FERRITIN: Ferritin: 164 ng/mL (ref 10–291)

## 2011-02-25 LAB — BRAIN NATRIURETIC PEPTIDE: Pro B Natriuretic peptide (BNP): 408 pg/mL — ABNORMAL HIGH (ref 0.0–100.0)

## 2011-02-25 LAB — HEMOGLOBIN A1C
Hgb A1c MFr Bld: 9.8 % — ABNORMAL HIGH (ref 4.6–6.1)
Mean Plasma Glucose: 235 mg/dL

## 2011-02-25 LAB — MAGNESIUM: Magnesium: 2.3 mg/dL (ref 1.5–2.5)

## 2011-02-25 LAB — PHOSPHORUS: Phosphorus: 2.5 mg/dL (ref 2.3–4.6)

## 2011-02-25 LAB — RETICULOCYTES: Retic Count, Absolute: 57.8 10*3/uL (ref 19.0–186.0)

## 2011-02-25 LAB — LIPID PANEL
Total CHOL/HDL Ratio: 2.5 RATIO
VLDL: 25 mg/dL (ref 0–40)

## 2011-02-27 LAB — DIFFERENTIAL
Basophils Absolute: 0 10*3/uL (ref 0.0–0.1)
Basophils Absolute: 0 10*3/uL (ref 0.0–0.1)
Basophils Relative: 0 % (ref 0–1)
Basophils Relative: 1 % (ref 0–1)
Basophils Relative: 1 % (ref 0–1)
Eosinophils Absolute: 0.1 10*3/uL (ref 0.0–0.7)
Eosinophils Absolute: 0.2 10*3/uL (ref 0.0–0.7)
Lymphs Abs: 1.5 10*3/uL (ref 0.7–4.0)
Monocytes Absolute: 0.5 10*3/uL (ref 0.1–1.0)
Monocytes Relative: 6 % (ref 3–12)
Neutro Abs: 6.1 10*3/uL (ref 1.7–7.7)
Neutrophils Relative %: 74 % (ref 43–77)
Neutrophils Relative %: 78 % — ABNORMAL HIGH (ref 43–77)

## 2011-02-27 LAB — BASIC METABOLIC PANEL
BUN: 18 mg/dL (ref 6–23)
BUN: 19 mg/dL (ref 6–23)
BUN: 26 mg/dL — ABNORMAL HIGH (ref 6–23)
CO2: 31 mEq/L (ref 19–32)
Calcium: 9.7 mg/dL (ref 8.4–10.5)
Chloride: 96 mEq/L (ref 96–112)
Chloride: 97 mEq/L (ref 96–112)
Creatinine, Ser: 1.1 mg/dL (ref 0.4–1.2)
Creatinine, Ser: 1.27 mg/dL — ABNORMAL HIGH (ref 0.4–1.2)
GFR calc non Af Amer: 41 mL/min — ABNORMAL LOW (ref >60)
GFR calc non Af Amer: 48 mL/min — ABNORMAL LOW (ref >60)
Glucose, Bld: 166 mg/dL — ABNORMAL HIGH (ref 70–99)
Glucose, Bld: 88 mg/dL (ref 70–99)
Potassium: 4.5 mEq/L (ref 3.5–5.1)
Sodium: 136 mEq/L (ref 135–145)

## 2011-02-27 LAB — CBC
HCT: 26 % — ABNORMAL LOW (ref 36.0–46.0)
HCT: 27.6 % — ABNORMAL LOW (ref 36.0–46.0)
Hemoglobin: 8.8 g/dL — ABNORMAL LOW (ref 12.0–15.0)
Hemoglobin: 9.4 g/dL — ABNORMAL LOW (ref 12.0–15.0)
MCHC: 34.4 g/dL (ref 30.0–36.0)
MCV: 85.8 fL (ref 78.0–100.0)
MCV: 86.1 fL (ref 78.0–100.0)
Platelets: 311 10*3/uL (ref 150–400)
Platelets: 319 10*3/uL (ref 150–400)
RDW: 15.6 % — ABNORMAL HIGH (ref 11.5–15.5)
RDW: 15.8 % — ABNORMAL HIGH (ref 11.5–15.5)
RDW: 16.3 % — ABNORMAL HIGH (ref 11.5–15.5)
WBC: 9.1 10*3/uL (ref 4.0–10.5)
WBC: 9.6 10*3/uL (ref 4.0–10.5)

## 2011-02-27 LAB — IRON AND TIBC
Iron: 43 ug/dL (ref 42–135)
Saturation Ratios: 20 % (ref 20–55)
TIBC: 210 ug/dL — ABNORMAL LOW (ref 250–470)

## 2011-02-27 LAB — GLUCOSE, CAPILLARY
Glucose-Capillary: 114 mg/dL — ABNORMAL HIGH (ref 70–99)
Glucose-Capillary: 211 mg/dL — ABNORMAL HIGH (ref 70–99)
Glucose-Capillary: 291 mg/dL — ABNORMAL HIGH (ref 70–99)
Glucose-Capillary: 79 mg/dL (ref 70–99)

## 2011-02-27 LAB — COMPREHENSIVE METABOLIC PANEL
Alkaline Phosphatase: 50 U/L (ref 39–117)
BUN: 14 mg/dL (ref 6–23)
GFR calc non Af Amer: 38 mL/min — ABNORMAL LOW (ref >60)
Glucose, Bld: 213 mg/dL — ABNORMAL HIGH (ref 70–99)
Potassium: 5 mEq/L (ref 3.5–5.1)
Total Bilirubin: 0.3 mg/dL (ref 0.3–1.2)
Total Protein: 6.8 g/dL (ref 6.0–8.3)

## 2011-02-27 LAB — BRAIN NATRIURETIC PEPTIDE: Pro B Natriuretic peptide (BNP): <30 pg/mL (ref 0.0–100.0)

## 2011-04-21 NOTE — Assessment & Plan Note (Signed)
[facility] OFFICE NOTE   NAME:[name], [name]                      MRN:          [id number]  DATE:10/24/2008                            DOB:          [birth year]    PRIMARY CARE PHYSICIAN:  Ramond Marrow. Quita Skye, NP   REASON FOR PRESENTATION:  Evaluate the patient with coronary disease and  peripheral vascular disease and hypertension.   HISTORY OF PRESENT ILLNESS:  The patient returns for yearly followup.  She has done well from a cardiovascular standpoint since I last saw her.  She was hospitalized in May with urosepsis.  However, she has not had  any acute cardiac problems.  She gets along doing some little chores  surround her house.  She is not particularly active.  However, with this  level of activity she denies any chest discomfort, neck or arm  discomfort.  She has had no palpitation, presyncope, or syncope.  She  denies any PND or orthopnea.  She does have an aching bony-type  discomfort in the place where she actually broke her arm, but does not  sound like an anginal equivalent.   PAST MEDICAL HISTORY:  1. Coronary artery disease status post CABG in April 2006 (LIMA to the      LAD, SVG to circumflex, SVG to PDA).  2. Well-preserved ejection fraction.  3. Carotid stenosis (40-59% right, 0-39% left, last study in September      2007).  4. Diabetes mellitus.  5. Hypertension.  6. Dyslipidemia.  7. Hysterectomy.  8. Cholecystectomy.   ALLERGIES:  DARVOCET.   MEDICATIONS:  1. Humulin.  2. Aspirin 81 mg daily.  3. Multivitamin.  4. Plavix 75 mg daily.  5. Nexium 40 mg daily.  6. Vytorin 10/40 nightly.  7. Metoprolol 50 mg b.i.d.  8. Amlodipine 5 mg daily.  9. Paroxetine 10 mg daily.  10.Diclofenac 75 mg b.i.d.  11.Celebrex 200 mg daily.   REVIEW OF SYSTEMS:  As stated in the HPI and otherwise negative for  other systems.   PHYSICAL EXAMINATION:  VITAL SIGNS:  The patient is in no  distress.  GENERAL:  Blood pressure 148/78, heart rate 61 and regular, body mass  index 33.  HEENT:  Eyes unremarkable, pupils equal, round, and reactive to light,  fundi not visualized, oral mucosa unremarkable.  NECK:  No jugular venous distention at 45 degrees, carotid upstroke  brisk and symmetric, no bruits, no thyromegaly.  LYMPHATICS:  No cervical, axillary, or inguinal adenopathy.  LUNGS:  Clear to auscultation bilaterally.  BACK:  No costovertebral angle tenderness.  CHEST:  Well-healed sternotomy scar.  HEART:  PMI not displaced or sustained, S1 and S2 within normal, no S3,  no S4, no clicks, no rubs, no murmurs.  ABDOMEN:  Obese, positive bowel sounds normal in frequency and pitch, no  bruits, no rebound, no guarding, no midline pulsatile mass, no  organomegaly.  SKIN:  No rashes, no nodules.  EXTREMITIES:  Upper pulse 2+, 2+ posterior tibialis bilaterally, mild  bilateral lower extremity edema, no cyanosis, no clubbing.  NEUROLOGIC:  Oriented to person,  place, and time, cranial nerves II  through XII grossly intact, motor grossly intact.   EKG:  Sinus rhythm, rate 61, axis within normal limits, intervals within  normal limits, no acute ST-T wave changes.   ASSESSMENT AND PLAN:  1. Coronary artery disease.  The patient is having no new symptoms      related to this.  No further cardiovascular testing is suggested.      We will continue aggressive risk reduction.  2. Carotid stenosis.  She is overdue for a carotid Doppler and I will      arrange this.  3. Hypertension.  Her blood pressure is not controlled, and I reviewed      frequent office visits when it is typically above 140/90.  I am      taking the liberty of increasing her amlodipine to 7.5 mg daily.  4. Dyslipidemia, per Mrs. Steadman with a goal LDL less than 70 and      HDL greater than 40.  5. Diabetes.  This is not well controlled.  Dr. [name] works      aggressively on this.  I had a long  discussion with the patient and      her daughter about removing the concentrated sugar and the high      glycemic foods from their house.  Hopefully, they will comply.  6. Followup.  I will see her back in 1 year or sooner if needed.     Minus Breeding, MD, [facility]  Electronically Signed    JH/MedQ  DD: 10/24/2008  DT: 10/25/2008  Job #: [id number]   cc:   Ramond Marrow. Quita Skye, NP

## 2011-04-21 NOTE — Assessment & Plan Note (Signed)
[facility]                            CARDIOLOGY OFFICE NOTE   NAME:Severe, [name]                      MRN:          [id number]  DATE:09/21/2007                            DOB:          June 20, 1931    PRIMARY:  Olivia Mackie, nurse practitioner.   REASON FOR PRESENTATION:  Evaluate patient with coronary disease.   HISTORY OF PRESENT ILLNESS:  The patient returns for followup of the  above.  She has done well since I last saw her.  It has actually been 12  months.  This is the longest she has gone without hospital visit or add-  on visit for chest pain.  She is now 77 years old.  She gets around in  her house and does some yard work and carries some wood.  With this  level of activity she does not have any recurrent or reproducible chest  discomfort.  She says she rarely takes a nitroglycerin.  She has no  shortness of breath, denies any PND or orthopnea.  She has no  palpitations, presyncope or syncope.   PAST MEDICAL HISTORY:  1. Coronary artery disease status post CABG (March 13, 2005, with a      LIMA to the LAD, SVG to circumflex, SVG to PDA), well preserved      ejection fraction.  2. Diabetes.  3. Hypertension.  4. Hyperlipidemia.  5. Hysterectomy.  6. Cholecystectomy.  7. Questionable Parkinson's disease.  8. Bilateral carotid artery stenosis.   ALLERGIES:  DARVOCET.   MEDICATIONS:  1. Paroxetine 10 mg daily.  2. Nexium 40 mg daily.  3. Vytorin 10/40 daily.  4. Lopressor 50 mg b.i.d.  5. Plavix 75 mg daily.  6. Humulin.  7. Aspirin 325 mg daily.  8. Zyrtec.  9. Lisinopril 20 mg b.i.d.  10.Aspirin 81 mg daily.  11.Multivitamin.  12.Celebrex.  13.Amlodipine 5 mg daily.   REVIEW OF SYSTEMS:  As stated in the HPI and otherwise negative for  other systems.   PHYSICAL EXAMINATION:  The patient is in no distress.  Blood pressure  138/68, heart rate 60 and regular, weight 216 pounds, body mass index  37.  HEENT:  Eyelids  unremarkable, pupils equal, round, react to light, fundi  not visualized, oral mucosa unremarkable.  NECK:  No jugular venous distention at 45 degrees, carotid upstroke  brisk and symmetric, no bruits, no thyromegaly.  LYMPHATICS:  No nodules.  LUNGS:  Clear to auscultation bilaterally.  BACK:  No costovertebral angle tenderness.  CHEST:  A well healed sternotomy scar.  HEART:  PMI not displaced or sustained, S1 and S2 within normal limits,  no S3, no S4, no clicks, no rubs, no murmurs.  ABDOMEN:  Obese, positive bowel sounds normal in frequency and pitch, no  bruits, no rebound, no guarding, no midline pulses, no masses or  organomegaly.  SKIN:  No rashes, no nodules.  EXTREMITIES:  Upper pulses 2+, 2+ posterior tibialis bilaterally, mild  bilateral lower extremity edema.  NEURO:  Grossly intact.   EKG:  Sinus rhythm, rate 60, axis within normal limits, intervals within  normal  limits, no acute ST-T wave changes.   ASSESSMENT AND PLAN:  1. Coronary disease.  The patient is actually doing well status post      bypass.  She has had no active symptoms.  She is participating in      secondary risk reduction.  2. Dyslipidemia.  She has an excellent lipid profile and will continue      on the medications as listed.  We did talk about diet.  3. Diabetes.  Her hemoglobin A1c was 8.3.  [name] is not happy      with this and continues to follow it.  4. Hypertension.  Blood pressure is controlled and she will continue      the medications as listed.  5. Obesity.  She understands the need to lose weight with diet and      exercise.  6. Carotid stenosis.  The patient will be in the call-back list as she      had some moderate blockage in the right.  7. Followup.  I will see her back in 12 months or sooner if needed.     Minus Breeding, MD, [facility]  Electronically Signed    JH/MedQ  DD: 09/21/2007  DT: 09/22/2007  Job #: [id number]   cc:   Olivia Mackie

## 2011-04-21 NOTE — H&P (Signed)
NAMEMANDE, KOLASINSKI               ACCOUNT NO.:  [id number]   MEDICAL RECORD NO.:  [id number]          PATIENT TYPE:  OBV   LOCATION:  [id number]                          FACILITY:  [facility]   PHYSICIAN:  Bonnielee Haff, MD     DATE OF BIRTH:  [birth year]   DATE OF ADMISSION:  04/16/2008  DATE OF DISCHARGE:  LH                              HISTORY & PHYSICAL   PRIMARY CARE PHYSICIAN:  The patient's primary medical doctor is Chipper Herb, M.D. at [place].   CARDIOLOGIST:  Minus Breeding, M.D., [facility] with [facility] cardiology.   ADMISSION DIAGNOSES:  1. Abdominal pain likely constipation.  2. Hyperglycemia requiring better control.  3. Insulin-dependent diabetes.  4. Coronary artery disease.  5. Hypertension.  6. Depression.   CHIEF COMPLAINT:  Abdominal pain and dizziness.   HISTORY OF PRESENT ILLNESS:  The patient is a 77 year old Caucasian  female who was in her usual state of health until yesterday evening when  she felt dizzy and  experienced a lot of chills and abdominal pain.  This prompted her to go to a [employer] where her blood pressure was  found to be elevated and the EMS was called.  The patient was given 10  mg labetalol by the EMS and she was brought into the hospital.   In the ED her pressures have been within reasonable limits.  She states  her abdominal pain is located in the lower abdomen, which sometimes  radiates to the back.  She mentions that she has not had such pain in  the past.  The pain is a cramping kind of pain.  It is about a 6/10 in  intensity.  She denies any dysuria.  She mentions that she has problems  with constipation.  She goes without bowel movements for 4-5 days and  then suddenly experiences diarrhea.  She has had a colonoscopy about 4  years ago done at [facility], which revealed polyps and nothing else  beyond that.  She mentions that she had a piece of cake and a doughnut  last night.  She mentions that her blood  sugars are usually less than  200.   MEDICATIONS:  The patient's medications at home include:  1. Paroxetine 10 mg daily.  2. Norvasc 5 mg daily.  3. Nitrostat as needed for chest pain.  4. Aspirin 81 mg daily.  5. Voltaren 75 mg twice a day.  6. Celebrex 200 mg once a day.  7. Plavix 75 mg once a day.  8. Nexium 40 mg once a day.  9. Lisinopril 20 mg once a day.  10.Metoprolol to 50 mg twice a day.  11.Meclizine 25 mg as needed.  12.Humulin N 53 units in the morning and 20 units at nighttime.  13.Multivitamin plus iron daily.  14.Vytorin 10/40 one tablet daily.   ALLERGIES:  The patient is allergic to DARVOCET.  She denies any allergy  to Valium that is listed on her profile.   PAST MEDICAL AND SURGICAL HISTORY:  The past medical history is positive  for:  1. Coronary artery  disease status post CABG.  2. History of insulin-dependent diabetes.  3. Dyslipidemia.  4. Hypertension.  5. TIAs.  6. The patient has had a hysterectomy; and,  7. Cholecystectomy.   SOCIAL HISTORY:  The patient lives with her daughter in [place].  No  smoking, alcohol or illicit drug use.  She states she is able to  ambulate on her own.  She does have decreased vision.   FAMILY HISTORY:  The family history is noncontributory.   REVIEW OF SYSTEMS:  GENERAL:  Positive for weakness and malaise.  HEENT:  Positive for decreased vision that is chronic.  CARDIOVASCULAR:  Unremarkable.  RESPIRATORY:  Unremarkable.  GASTROINTESTINAL:  As in  HPI.  GENITOURINARY:  Unremarkable.  PSYCHIATRIC:  Unremarkable.  NEUROLOGICAL:  Unremarkable.  Other systems are unremarkable.   PHYSICAL EXAMINATION:  VITAL SIGNS: Temperature initially was 100.0 and  subsequently is 98.9, blood pressure 118/40, heart rate 88, respiratory  rate 16, and saturation 96% on room air.  GENERAL APPEARANCE:  On general exam this is an elderly white female in  no distress.  HEENT:  There is no pallor.  No icterus.  Oral mucous membranes  are  moist.  No oral lesions are noted.  NECK:  The neck is soft and supple.  No thyromegaly is appreciated.  LUNGS:  The lungs are clear to auscultation bilaterally.  ABDOMEN:  The abdomen is soft.  There is mild tenderness in the lower  quadrants bilaterally.  No masses appreciated.  Bowel sounds are  present.  No rebound or rigidity is appreciated.  EXTREMITIES:  The extremities show no edema.  Peripheral pulses are  palpable.  NEUROLOGIC:  The patient is alert and oriented x3.  No focal  neurological deficits are present.   LABORATORY DATA:  White count is 8.2, hemoglobin is 10.8 and platelet  count is 130,000 with 96% neutrophils noted.  Sodium 132, potassium 4.8,  chloride 101, bicarb 27, glucose 431, BUN  25, and creatinine 1.  Amylase and lipase are normal.  UA does not suggest infection.  She had  an acute abdominal series, which showed moderate stool throughout the  colon with mild gaseous distention.  No obstruction or free air was  noted.   ASSESSMENT:  1. This is a 77 year old Caucasian female with a past medical history      of diabetes and coronary artery disease who presents with abdominal      pain and was also found to have significant hyperglycemia.  The      etiology for abdominal pain is most likely constipation though we      need to rule out other reasons as well.  She could have      diverticulitis, although her white count is normal.   1. The patient is also hyperglycemic as she has poorly controlled      diabetes.  She mentions she had a piece of cake and a doughnut last      night, which could account for her high blood sugars.  Despite      intravenous insulin her blood sugars are coming down appropriately.      Her blood sugars are still more than 400.   So I think this requires at least observation in the hospital and  inpatient management for at least a few more hours.   PLAN:  1. Abdominal pain.  We will get a CAT scan to rule out other       etiologies for her abdominal pain.  We will start her on a stool      softener and we will also start her on MiraLax, which might help      regulate her bowels.  TSH will be checked.  Stool for occult blood      will be checked.  2. Hyperglycemia in the setting of insulin-dependent diabetes.  We      will continue her with a glucose stabilizer for now; and, once her      blood sugars are controlled we will switch her back to her Humulin      N.  We will also check hemoglobin A-1-C.  3. Her other medical problems including coronary artery disease and      hypertension are all stable.  I am anticipating if the CT is okay      and if the blood sugars come down to reasonable limits she might be      able to go home by this evening.      Bonnielee Haff, MD  Electronically Signed     GK/MEDQ  D:  04/17/2008  T:  04/17/2008  Job:  [id number]   cc:   Chipper Herb, M.D.  Fax: [phone]

## 2011-04-21 NOTE — Group Therapy Note (Signed)
[name], [name]               ACCOUNT NO.:  [id number]   MEDICAL RECORD NO.:  [id number]          PATIENT TYPE:  OBV   LOCATION:  [id number]                          FACILITY:  [facility]   PHYSICIAN:  Salem Caster, DO    DATE OF BIRTH:  1931/06/16   DATE OF PROCEDURE:  04/19/2008  DATE OF DISCHARGE:                                 PROGRESS NOTE   SUBJECTIVE:  Patient has no really major complaints today.  Patient  denies any chest pain, coughing, abdominal pain.   OBJECTIVE:  VITAL SIGNS:  Temperature is 98.0.  Pulse 65.  Respirations  20.  Blood pressure 154/59.  CARDIOVASCULAR:  S1, S2, is regular and normal.  LUNGS:  Clear to auscultation bilaterally.  No rales, rhonchi or wheeze.  ABDOMEN:  Soft, nontender, nondistended.  No rigidity or guarding.  Positive bowel sounds.  EXTREMITIES:  No cyanosis, clubbing or edema.   LABORATORIES:  White count is 4.8, hemoglobin 10.5, hematocrit 30.2,  platelet count 115, sodium 138, potassium 3.7, chloride 105, CO2 is 27,  glucose 117, BUN 13, creatinine 0.76.  Patient did have a positive blood  culture that is growing gram-negative rods.  She did also have a  positive urine culture of Klebsiella pneumonia.   ASSESSMENT AND PLAN:  1. Urosepsis with positive urine and blood cultures.  Patient will be      continued on IV Levaquin at this time.  Seems to be sensitive to      the Levaquin per her urine culture.  No sensitivities to her blood      culture at this time.  2. For her hyperglycemia, seems to be controlled.  Will continue her      current treatment at this time.  3. For abdominal pain, seems to be resolving.  We will continue      current treatment also.  4. Her hypertension seems to be stable.  5. She does have a history of coronary artery disease as well as      dyslipidemia.   Pending sensitivities to her blood culture, we will probably try to  discharge patient within the next 24-48 hours if she continues to be  afebrile and  has no elevation in white blood cell count.      Salem Caster, DO  Electronically Signed     SM/MEDQ  D:  04/19/2008  T:  04/19/2008  Job:  [id number]

## 2011-04-21 NOTE — Discharge Summary (Signed)
[name], ZERVAS               ACCOUNT NO.:  [id number]   MEDICAL RECORD NO.:  [id number]          PATIENT TYPE:  INP   LOCATION:  [id number]                          FACILITY:  [facility]   PHYSICIAN:  Salem Caster, DO    DATE OF BIRTH:  12/26/1930   DATE OF ADMISSION:  04/17/2008  DATE OF DISCHARGE:  05/15/2009LH                               DISCHARGE SUMMARY   DISCHARGE DIAGNOSES:  1. Urosepsis.  2. Type 2 diabetes.  3. Abdominal pain that has resolved.  4. Hypertension.  5. History of coronary artery disease.   BRIEF HOSPITAL COURSE:  This is a 77 year old Caucasian female who  presented with dizziness, chills, and abdominal pain.  The patient  states due to these symptoms, she went to a fire station where her blood  pressure was found to be elevated and EMS was called.  EMS gave her 10  of labetalol.  She was brought to the ER.  Her blood pressures were in  reasonable limits in the emergency room.  She was complaining of  abdominal pain in the lower abdomen that radiated to her back.  She  described it as 6/10 with cramping in nature.  She had no genitourinary  complaints.  The patient was having a bowel movement at that time, but  suddenly experienced diarrhea.  She had a colonoscopy 4 years prior at  [facility], had polyps and no other problems.  The patient was seen in  the emergency room.  Initial temperature was 100.0.  She was given  Tylenol, went down to 98.9.  Blood pressures were in reasonable range.   INITIAL LABS:  She had a white count of 8200, hemoglobin 10.8, platelet  count of 130,000 with 96 neutrophils.  Her sodium 132, potassium 4.8,  chloride 101, bicarb 27, glucose 431, BUN 25, and creatinine of 1.  She  had acute abdominal series, which showed moderate stool with mild  gaseous distention of the colon, no free air.  She subsequently had a CT  of her abdomen and pelvis, which showed no significant abnormalities.  The pelvis did show diverticulosis of the mid  sigmoid portion of the  colon without discreet diverticulitis.  It shows mild stenosis at L4-L5  due to broad-based disk herniation.  She did have a chest x-ray done on  Apr 18, 2008, that showed no acute findings.  The patient was placed on  stool softeners to regulate her bowel movements and stool for occult  bloods were checked.  Due to her hyperglycemia, the patient was placed  on glucose stabilizer and was subsequently switched to insulin sliding  scale.  The patient's sugars have come down to reasonable range.  The  patient had blood and urine cultures performed.  The urine cultures were  positive for Klebsiella pneumonia and she also had one set of positive  blood culture that showed Klebsiella pneumonia.  The patient has been  maintained on IV antibiotics and at this time, the patient remained to  be afebrile and does not have elevated white count.  So, we feel that  she is safe to  be discharged to home at this time.   MEDICATIONS ON DISCHARGE:  1. Paxil 10 mg daily.  2. Norvasc 5 mg.  3. Aspirin 81 mg daily.  4. Voltaren 75 mg twice daily.  5. Celebrex 200 mg daily.  6. Plavix 75 mg daily.  7. Nexium 40 mg daily.  8. Lisinopril 10 mg daily.  9. Metoprolol 50 mg twice a day.  10.Meclizine 25 mg as needed.  11.Humulin 53 units in the morning and 23 units in the p.m.  12.Nitrostat sublingual as needed.  13.Bactrim DS one tablet twice daily for 7 days.   VITALS ON DISCHARGE:  Temperature is 98.9, pulse 78, respirations 20,  and blood pressure 162/86.   LABORATORY DATA:  White count is 4500, hemoglobin 10.7, hematocrit 31.1,  and platelet count 126,000.  Sodium 140, potassium 4.0, chloride 103,  C02 is 30, glucose 121, BUN 11, and creatinine 0.84.   CONDITION ON DISCHARGE:  Stable.   DISPOSITION:  The patient to be discharged to home.   DISCHARGE INSTRUCTIONS:  The patient to maintain a 1800-2000 calorie ADA  diet.  She is to increase her activity slowly.  The patient is  to follow  up with her primary care physician within the next 7 days.  The patient  is to return to the emergency room if she has any severe abdominal pain,  shortness of breath, chest pain, and/or call 911.      Salem Caster, DO  Electronically Signed     SM/MEDQ  D:  04/20/2008  T:  04/21/2008  Job:  [id number]

## 2011-04-21 NOTE — Group Therapy Note (Signed)
[name], [name]               ACCOUNT NO.:  [id number]   MEDICAL RECORD NO.:  [id number]          PATIENT TYPE:  OBV   LOCATION:  [id number]                          FACILITY:  [facility]   PHYSICIAN:  Bonnielee Haff, MD     DATE OF BIRTH:  21-May-1931   DATE OF PROCEDURE:  04/18/2008  DATE OF DISCHARGE:                                 PROGRESS NOTE   SUBJECTIVE:  Patient complaining of a cough, which is dry.  She also  mentioned some coughing, especially with eating and drinking.  Otherwise, she denies any shortness of breath or chest pain.  Her  abdominal pain is almost resolved.  No other symptoms are present at  this time.   OBJECTIVE:  VITAL SIGNS:  The patient's temperature went up to 101.4  last night.  Currently, it is 98.8, heart rate 69, respiratory rate 20,  blood pressure 149/64, saturation 95% on room air.  Her CBG this morning  was 246.  LUNGS:  Actually are clear to auscultation bilaterally.  CARDIOVASCULAR:  S1, S2, is normal, regular.  ABDOMEN:  Soft, nontender, nondistended.  EXTREMITIES:  Do not show any edema.   No labs were ordered for this morning, but they will be ordered now.   ASSESSMENT AND PLAN:  1. Fever.  Etiology is unclear.  She does not really have any focal      symptoms except for a vague complaint of cough.  So, we will go      ahead and get a chest x-ray done and see what that shows.  I will      empirically put her on Levaquin for now.  It should be noted that      when she was admitted she had an acute abdominal series with one      view of the chest, and it did not show any acute findings.  2. Hyperglycemia.  The patient's blood sugars are better.  We have      started her back on her Humulin N regimen.  The dosage may require      some more adjustment.  3. Abdominal pain is thought to be secondary to constipation.  CT did      not show any significant other abnormalities, except for stool, and      some diverticulosis without any diverticulitis.   So, I have started      the patient on MiraLax, and the patient has been having bowel      movements daily.  4. Hypertension, stable.  Continue current medications.  5. History of coronary artery disease is stable.  She has a history of      stroke as well in the past.  6. History of dyslipidemia, also stable.   So, basically today we have followup on chest x-ray results, check on  CBC and her BMET results as well.  We will await a swallow evaluation  for possible aspiration based on her history.  We will ambulate her a  little bit, and we basically have to ensure that she remains afebrile.  If all of the above  tests are within reasonable limits, I think she may  be able to go home tomorrow.      Bonnielee Haff, MD  Electronically Signed     GK/MEDQ  D:  04/18/2008  T:  04/18/2008  Job:  [id number]

## 2011-04-24 NOTE — Op Note (Signed)
[place]  Patient:    [name], [name]                        MRN: [id number] Proc. Date: 03/30/00 Adm. Date:  [id number] Attending:  Erick Blinks CC:         Redge Gainer, M.D.             Minus Breeding, M.D. [facility]                           Operative Report  PREOPERATIVE DIAGNOSIS:  Chronic calculus cholecystitis.  POSTOPERATIVE DIAGNOSIS:  Chronic calculus cholecystitis.  PROCEDURE:  Laparoscopic cholecystectomy with intraoperative cholangiogram.  SURGEON:  Odis Hollingshead, M.D.  ASSISTANT:  Abbott Pao. March Rummage, M.D.  ANESTHESIA:  General, Dr. Ala Dach, M.D.  INDICATIONS:  This 77 year old female has been having recurrent bouts of biliary colic.  She underwent a percutaneous transluminal coronary angioplasty and stent placement back in March and now it is felt to be safe for her to have her laparoscopic cholecystectomy.  DESCRIPTION OF PROCEDURE:  She was placed supine on the operating room table, and general anesthesia was administered.  Her abdomen was sterilely prepped and draped.  A subumbilical incision was made, incising the skin sharply.  The fascia was identified and a 1.0 cm incision made in the midline fascia.  A pursestring suture of #0 Vicryl was placed around the fascial edges.  The peritoneal cavity was entered bluntly and under direct vision.  A Hassan trocar was introduced into the peritoneal cavity and a pneumoperitoneum created by the insufflation of CO2 gas. She subsequently was placed in an appropriate position for the procedure.  An 11.0 mm trocar was placed in the epigastrium through a similar-sized incision and two 5.0 mm trocars placed right abdomen through similar-sized incisions.  The fundus of the gallbladder was grasped.  The gallbladder did not have many adhesions to it. We were able to grasp the fundus and retract it toward her shoulder, and then grasp the infundibulum and retract  it laterally.  Using careful blunt dissection, I identified the cystic artery and the cystic duct.  The cystic artery was clipped twice proximally and once distally and divided.  I then skeletonized the cystic  duct and clipped it at its junction with the gallbladder.  A partial ductotomy as performed and bile was milked back.  Next, a cholangio-catheter was introduced through the anterior abdominal wall and placed into the cystic duct.  A cholangiogram was performed.  Under real time fluoroscopy there was prompt filling of the common bile duct and duodenum.  The common bile duct tapered off with a ong tapering into the duodenum, but there was no obvious obstruction noted during real time fluoroscopy.  I also examined the proximal right and left hepatic ducts which appeared to be nonobstructed.  Following the cholangiogram completion, the cholangio-catheter was removed.  The cystic duct was clipped three times proximally, and then divided sharply.  Using the electrocautery, the gallbladder was dissected free from the liver bed.  The bleeding points in the liver bed were controlled with the cautery.  The gallbladder fossa was then irrigated, and there was no evidence of bile leak or bleeding.  The gallbladder was removed through the subumbilical port and the fascial defect was then closed by tying down the pursestring suture.  The perihepatic area was  irrigated and once again no bleeding or  bile leakage was noted.  Next all trocars were removed and the pneumoperitoneum was released.  The skin incisions were closed with #4-0 monocryl subcuticular stitches, followed by Steri-Strips and sterile dressings.  She tolerated the procedure fairly well without any apparent complications. She was taken to the recovery room in satisfactory condition. DD:  03/30/00 TD:  03/29/00 Job: [id number]

## 2011-04-24 NOTE — Consult Note (Signed)
[place]  Patient:    [name], [name] Visit Number: [id number] MRN: [id number]          Service Type: MED Location: [phone] Attending Physician:  Bosie Clos Dictated by:   Alyson Locket. Love, M.D. Admit Date:  09/17/2001                            Consultation Report  DATE OF BIRTH:  1931/05/12  PATIENTS ADDRESS:  [address] [place], [place].  REASON FOR CONSULTATION:  This 77 year old, right-handed, white widowed female from [place], [place], was admitted on September 17, 2001, for evaluation of chest pain and has been complaining of visual disturbance for which we are asked to see her.  HISTORY OF PRESENT ILLNESS:  [name] has a five-year history of coronary artery disease and is status post multiple stents. She has risk factors of hypertension, diabetes mellitus, and dyslipidemia. She was admitted on September 17, 2001, for chest pain. Three weeks prior to that time, however, she noted "streaks" occurring in her left eye lasting seconds. Initially, she suspected that this may be related to migraine which she has had in the past. She was in her usual state of health until Saturday morning when she noted the onset of left shoulder and right shoulder pain associated at times with left eye blurring. She had bifrontal headache and loss of vision to her left noted that day or the next day with nausea and vomiting. She noted no double vision, slurred speech, falling episodes, etc. Her medications at the time of admission included Paxil 10 mg q.d., Celebrex 200 mg q.d., Nexium 40 mg q.d., Sinemet CR 50/100 b.i.d., lisinopril 20 mg b.i.d., metformin 500 mg in the morning and 1000 mg at night, Imdur 120 mg q.d., Valium 2 mg q.6h. p.r.n., Cardizem CD 240 mg q.d., and Humulin about 30 units q.a.m.  PAST MEDICAL HISTORY:  Significant for coronary artery disease, status post multiple stents,  hypertension, diabetes mellitus, dyslipidemia, and suspected Parkinsons disease. She indicates that she has had TM joint pain in the past which may be responsible for her buccal lingual movements. She has had an MRI study of the brain with MR angiography showing evidence of an acute right calcarine posterior cerebral artery distribution ischemic stroke and a relatively recent, but older than three weeks, right inferior cerebellar stroke in the right posteroinferior cerebellar artery distribution. MR angiography shows slow flow in the right posterior cerebral artery and also in the right vertebral raising the question of intrinsic right vertebral disease. Doppler studies in the hospital have shown evidence of normal flow without evidence of ICA disease, and vertebral flow was reported to be antegrade though the MRA as listed above.  PHYSICAL EXAMINATION:  GENERAL:  Well-developed, pleasant female in no acute distress who was limited in her educational abilities and abilities to respond to command. She was able to give an adequate history.  VITAL SIGNS:  Her blood pressure lying in the right and left arm was 160/80.  NECK:  There was a left carotid and a left supraclavicular bruits heard. No right-sided bruits were heard.  NEUROLOGICAL:  Mental status:  She was alert and oriented x 3. Her cranial nerve examination revealed at times she tended to squint her eyes but no definite blepharospasm or blepharoclonus was noted. She had involuntary movement involving the platysma, the tongue, the lips, and the lower facial  movements bilaterally. She had a left homonymous hemianopsia. The pupils were reactive. She had diabetic neuropathy. The hearing was decreased. Air conduction was greater than bone conduction. Tongue was midline. The uvula was midline. Gags were present. Motor examination revealed good strength in the upper and lower extremities without increased tone, without  cogwheeling, without resting tremor present. Sensory examination was intact to pinprick in the upper extremities and in the lower extremities. Two-point discrimination and graphesthesia were not evaluated today. Deep tendon reflexes were 2+, and plantar responses were downgoing.  LABORATORY DATA:  MRI and MRA are as listed above.  IMPRESSION: 1. Right posterior cerebral artery strokes, new, code 434.01. 2. Right posteroinferior cerebellar artery stroke, older than three weeks,    code 434.01. 3. Buccal lingual dyskinesia, possibly indicative of ______ syndrome, side    effects from Sinemet, or possible other medications, code 781.1. 4. Diabetes mellitus, code 250.60. 5. Hypertension, code 796.2. 6. Coronary artery disease, code 429.2.  PLAN:  Plan at this time is to recommend treatment with aspirin and Plavix therapy. At some point, I would consider the possibility of taking her off of Sinemet, but this should be done at a later date as an outpatient. I am not convinced at this time that she has true Parkinsons disease. Dictated by:   Alyson Locket. Love, M.D. Attending Physician:  Bosie Clos DD:  09/20/01 TD:  09/20/01 Job: [id number]

## 2011-04-24 NOTE — Discharge Summary (Signed)
[name], [name]               ACCOUNT NO.:  [id number]   MEDICAL RECORD NO.:  [id number]          PATIENT TYPE:  INP   LOCATION:  16                         FACILITY:  [facility]   PHYSICIAN:  Champ Mungo. Lovena Le, M.D.  DATE OF BIRTH:  [birth year]   DATE OF ADMISSION:  02/25/2005  DATE OF DISCHARGE:  02/28/2005                                 DISCHARGE SUMMARY   DISCHARGE DIAGNOSIS:  Chest pain with negative cardiac enzymes, status post  cardiac catheterization on February 27, 2005, by Dr. Elta Guadeloupe Pulsipher.   RESULTS:  Normal left ventricular systolic function, severe diffuse three  vessel coronary artery disease.   PAST MEDICAL HISTORY:  1.  Hypertension.  2.  Hyperlipidemia.  3.  Type 2 diabetes.  4.  Family history of coronary artery disease.  5.  CVA approximately three years ago with decreased vision.  6.  Positive history of Parkinson's although patient states that she was      recently told by her neurologist that she does not have Parkinson's but      over numerous years has been taking anti-Parkinson's medication.  7.  Total abdominal hysterectomy.  8.  Appendectomy.  9.  Cholecystectomy.  10. Longstanding coronary artery disease, status post angioplasty and      stenting in the 1990s.   CONSULTATION THIS ADMISSION:  Lanelle Bal, M.D., who recommends with  patient's symptoms and three vessel coronary artery disease, coronary artery  bypass graft is recommended.  Arrangements have been made for surgery early  next week.   DISPOSITION:  1.  Home with Toprol XL 150 mg daily.  2.  Lisinopril 40 mg daily.  3.  Imdur 30 mg daily.  4.  Aspirin 81 mg daily.  5.  Metformin 500 mg.  Patient has been instructed do not resume until      Sunday p.m.  6.  She has also been told to continue her previous medications including      Vytorin, insulin, Paxil, Nexium and Zyrtec.  7.  Patient has also been instructed not to take any Plavix or any      Metoprolol if she has these  medications at home.   PAIN MANAGEMENT:  Tylenol for general discomfort.  Nitroglycerin for chest  pain.   ACTIVITY:  No driving.  No lifting over 10 pounds x1 week.   DIET:  She is to follow a low fat, low salt, low cholesterol diet.   WOUND CARE:  No tub bathing x1 week.   She will call our office for any fever, any pain or swelling from her cath  site.   FOLLOW UP:  As instructed by Dr. Servando Snare for now.  I have given her his  office phone number and also will call Dr. Everrett Coombe office to remind them  patient is for a bypass next week but is being discharged home over the  weekend.   HOSPITAL COURSE:  [name] is a 77 year old Caucasian female with known  history of CAD, status post angioplasty and stent in the 1990s, other  medical history as stated above; who presented to [facility] Room on the date of admission complaining of substernal  chest pain radiating to her left arm and chest. Pain was quite severe.  She  states it was similar to the pain she experienced when she had her stent  many years ago.  Patient was admitted and worked up to rule out myocardial  infarction.  Cardiac enzymes were negative.  She was taken to the cardiac  catheterization lab with results as stated above.  Tolerated the procedure  without any complications.  Consultation with CVTS carried out.  Plan is for  three vessel bypass early next week.  Patient is being discharged home in  the meantime under the care of her family.  Will follow up with Dr. Servando Snare  as instructed.      MB/MEDQ  D:  02/28/2005  T:  03/01/2005  Job:  [id number]   cc:   Lanelle Bal, MD  [address]  [place] [address]  Email: [email or web address]   Chipper Herb, M.D.  [place]  [place] [address]  Fax: [phone]   Minus Breeding, M.D.

## 2011-04-24 NOTE — Discharge Summary (Signed)
**Note Morgan-Identified via Obfuscation** [place]  Patient:    DEMII, [name] Visit Number: [id number] MRN: [id number]          Service Type: MED Location: [phone] Attending Physician:  Bosie Clos Dictated by:   [name], P.A. Admit Date:  09/17/2001 Discharge Date: 09/23/2001   CC:         [name], M.D. [facility]  [name], N.P., [name].  [name], M.D.   Discharge Summary  DISCHARGE DIAGNOSES: 1. Coronary artery disease. 2. Gastroesophageal reflux disease. 3. Parkinsons disease. 4. Degenerative joint disease. 5. Hypotension. 6. Diabetes mellitus. 7. Diabetic retinopathy. 8. Cerebrovascular accident.  HISTORY OF PRESENT ILLNESS:  [name] is a 77 year old female with known coronary artery disease.  She initially presented to the [facility] Emergency Room for evaluation of exertional chest pain.  She subsequently transported to [facility] for further evaluation.  HOSPITAL COURSE:  She was seen and admitted by [name].  [name] noted that [name] had been admitted to [facility] in February 2002, with unstable angina and had declined catheterization at that time.  He also noted that she reported some left-sided facial pain as well as left-sided diplopia. He felt that continued medical therapy was indicated and planned to cycle cardiac enzymes.  He also ordered CT scan of the head to rule out a TIA.  He discontinued the patients Cardizem and started her on Toprol as well as Plavix.  The next day, the patient was again seen by [name].  She had had no further chest pain or shortness of breath and she had also had no further neurological symptoms.  He noticed, however, that her blood pressure was quite labile as high as [id number] systolic and ordered adjustment in her medications. That afternoon, it was noted that her blood pressure had decreased to 123/57. She also stated that overall she  felt much better and that the vision in her left eye was improving.  On October 14, the patient was seen by Dr. Minus Figueroa.  He noted the patient had some vague chest discomfort and a questionable visual field defect.  The patients blood pressure was moderately controlled with systolics between [id number] and 150/60-70.  He plan to taper the nitroglycerin and continue her medical therapy.  He also plan to obtain either a neurology or ophthalmology consult.  The following morning, the patient was seen by [name] again.  Blood pressure was 158/73 and the patient continued to complain of left eye visual disturbances.  Later that day, the patient was seen by [name].  He felt that the patient had had a right posterior cerebral artery stroke which was new.  The patient also had a right posteroinferior cerebellar stroke which he felt was over three weeks old.  He also noticed some buccal lingual dyskinesia which he felt was possibly a side effect of the Sinemet.  He recommended continued treatment with aspirin and Plavix.  In the future, he planned to consider the possibility of discontinuing the Sinemet.  He noted that he was not convinced that she has true Parkinsons disease.  The patient underwent bilateral carotid ultrasound evaluation.  There was no evidence of significant ICA stenosis.  Vertebral artery flow was noted to be antegrade.  On October 16, the patient was seen by [name].  Blood pressure began to creep up again at 160/90.  He recommended an OT/PT consult and ordered a  2D echocardiogram.  The echocardiogram revealed overall left ventricular systolic function to be normal with an ejection fraction estimated being between 55 and 65%.  There was no evidence of left ventricular wall motion abnormalities and left ventricular wall thickness was mildly to moderately increased.  The aortic valve was mild calcified and there was trivial aortic valvular regurgitation.   Left atrial size was at upper limits of normal and there is noted to be some mild right ventricular hypertrophy.  On October 17, the patient was seen by [name].  She had no further chest pain or shortness of breath and had no new complaints.  Blood pressures were ranging [id number] systolic.  On October 18, the patient was seen by [name].  He noted the patients blood pressure was still quite labile.  However, he felt this was to be somewhat expected given her a reason for stroke.  Otherwise, he felt she was stable for discharge.  DISCHARGE MEDICATIONS:  1. Toprol XL 50 mg q.d.  2. Sinemet 25/100 b.i.d.  3. Lisinopril 20 mg b.i.d.  4. Glucophage 500 mg in the morning, 1000 mg at night.  5. Imdur 120 mg q.d.  6. Paxil 10 mg q.d.  7. Celebrex 200 mg q.d.  8. Nexium 40 mg q.d.  9. Enteric coated aspirin 325 mg q.d. 10. Clonidine 0.2 mg q.d. 11. Plavix 75 mg q.d. 12. Humulin N 30 units in the morning. 13. Valium as previously taken.  ACTIVITY:  Resume normal level of activity with family assistance as necessary.  She is to receive home health both OT and PT.  She was not to take Cardizem any longer.  FOLLOWUP:  She is to follow up with [name], N.P. as needed or scheduled.  She is to contact [name] office for appointment in approximately six to eight weeks.  She is to continued [name] office for an appointment in approximately two months.  LABORATORY DATA AND X-RAY FINDINGS:  White count 6.6, hemoglobin 11.5, hematocrit 34.2, platelets 229.  Total cholesterol 215, triglycerides 373, HDL 51, HDL 4.2, LDL 89.  TSH 1.042.  On October 15, the patient had an MRI of the brain without contrast.  This showed an acute right occipital stroke in the territory of the right posterior cerebral artery.  It showed a late subacute stroke in the right cerebellum and  right posterior inferior cerebellar artery territory.  There was also noted to be some moderate  small vessel changes elsewhere in the white matter of the cerebral hemispheres.  The patient then underwent an intracranial MR angiography.  There was noted to be diminished flow in the right vertebral artery suggesting a proximal lesion.  There was also diminished visualization at the distal right posterior cerebral artery branches compared to the left. This is felt to be consistent with infarction in that territory.  Electrocardiogram revealed sinus rhythm at 71, PR interval 152, QRS 96, QTC 423, axis 21. Dictated by:   [name], P.A. Attending Physician:  Bosie Clos DD:  09/23/01 TD:  09/26/01 Job: [id number]

## 2011-04-24 NOTE — Discharge Summary (Signed)
[name], Figueroa               ACCOUNT NO.:  [id number]   MEDICAL RECORD NO.:  [id number]          PATIENT TYPE:  INP   LOCATION:  2039                         FACILITY:  [facility]   PHYSICIAN:  Lanelle Bal, MD    DATE OF BIRTH:  Jul 01, 1931   DATE OF ADMISSION:  03/13/2005  DATE OF DISCHARGE:                                 DISCHARGE SUMMARY   PRIMARY DIAGNOSIS:  Coronary artery disease.   SECONDARY DIAGNOSES:  1.  Impacted humeral head fracture.  Currently has a sling on.  2.  Diabetes mellitus.  3.  History of Parkinson's, although the patient notes that she was recently      told by her neurologist that she did not have Parkinson's, but over      numerous years had been taking anti-Parkinson's medications.  4.  History of cerebrovascular accident approximately three years ago with      decreased vision.  5.  Hypertension.  6.  Hyperlipidemia.   ALLERGIES:  Allergic to DARVOCET, which causes confusion.   IN-HOSPITAL OPERATIONS AND PROCEDURES:  Coronary artery bypass grafting x 3  with left internal mammary artery to left anterior descending coronary  artery, reverse saphenous vein graft to the circumflex coronary artery  distally and reverse saphenous vein graft to the posterior descending  coronary artery with endovein harvest done.   HISTORY OF PRESENT ILLNESS:  [name] is a 77 year old female who has had  off and on chest pain for at least the past six months.  She notes that over  the past month she has had worsening discomfort in her chest that radiates  into her left arm and down into her hand.  It is easily relieved with rest.  She does not take nitroglycerin.  It is associated with some diaphoresis and  some shortness of breath.  She has had five to six previous hospitalizations  for chest pain, including a history of stent placement in March of 2001 in  which she had a cutting balloon angioplasty and stent placement.  Troponins  were not elevated.  She does  have cardiac risk factors for hypertension,  hyperlipidemia and diabetes mellitus type 2 x 10 years.  The patient  underwent cardiac catheterization on February 27, 2005, by [name].  This  showed severe three-vessel coronary artery disease.  We were then consulted.  The patient was evaluated by [name].  He discussed the risks and  benefits of this procedure with the patient.  She acknowledge understanding.  She was scheduled for the following week.  [name] did discharge her  home following her catheterization and prior to her surgery.  During the  time prior to her surgery, the patient fell and suffered an impacted humeral  head fracture.  [name] discussed this with orthopedics and the patient  was placed in a slight for right now.  For details of the patient's past  medical history and physical exam, please see the dictated history and  physical.   HOSPITAL COURSE:  On March 13, 2005, [name] underwent coronary artery  bypass grafting x 3 with  the left internal mammary artery to the left  anterior descending coronary artery, reverse saphenous vein graft to the  circumflex coronary artery distally and reverse saphenous vein graft to the  posterior descending coronary artery with endovein harvest done.  The  patient tolerated the procedure well and was transferred up to the intensive  care unit in stable condition.  The patient was extubated shortly after  surgery.  On postoperative day #1, the patient was seen to be  hemodynamically stable.  She was out of bed and in a chair.  On  postoperative day #2, the patient's chest tube and lines were discontinued.  She was saturating 99% on 2 L.  The patient was out of bed and ambulating,  but slightly unsteady on her feet.  The patient does have a history of  diabetes mellitus, type 2.  Postoperatively, she was placed on Lantus.  On  postoperative day #3, the patient was transferred out to 2000.  She was  slightly  hypertensive with a systolic blood pressure in the 180s, so she was  placed back on her lisinopril.  Physical therapy was consulted on  postoperative day #3 and was able to get the patient out of bed and  ambulating well.  During that time, she did desaturate in the 70s, but  rebounded to 95% four minutes following sitting down.  On postoperative day  #4, the patient's blood glucose levels were not well controlled.  At that  time, diabetic care was consulted.  They evaluated the patient and  recommended placing the patient on Amaryl 2 mg daily.  This was done.  Ms.  Figueroa continued to progress well.  She was ambulating well with  assistance.  She did have slight confusion postoperatively.  The patient was  evaluated for discharge options.  She will be going to stay at her son's  house post discharge.  He will be able to stay with her for 24 hours during  the day.  [name] was discharged to home on postoperative day #7 in  stable condition.  She was saturating 95% O2 saturations.  She was  ambulating well with assistance.  Her incisions were dry, intact and healing  well.  Lungs were clear to auscultation.  She was tachycardic.  She had a  regular rate and rhythm.  A followup appointment was scheduled with Dr.  Roxan Figueroa for Apr 09, 2005, at 1:45 p.m.  The patient will see [name]  on April 02, 2005, at 10:45 a.m.  At that appointment, the patient will  obtain a PA and lateral chest x-ray, which she will bring with her to Dr.  Leonarda Figueroa appointment.  [name] will also need to contact her  primary care doctor to schedule an appointment for management of her  diabetic medications.  She is also to contact the orthopedic physician to  schedule a followup appointment for evaluation of her humeral head fracture.  [name] received instructions on diet, activity level and incisional care.  She was told to wash her incisions with soap and water and to contact  the office if  she develops any drainage or bleeding from any of her incision  sites.  She acknowledged understanding.  She was also instructed on  ambulating three to four times per day and continue doing her breathing  exercises.  Educated on diet to be low-fat, low-salt, carbohydrate-modified,  medium-calorie diet.  The patient acknowledged understanding.  She also  received instructions on use of the Lantus  insulin.  The patient  acknowledged understanding.   DISCHARGE MEDICATIONS:  1.  Aspirin 325 mg p.o. daily.  2.  Lopressor 50 mg p.o. b.i.d.  3.  Lisinopril 20 mg p.o. daily.  4.  Vytorin 10/40 mg daily.  5.  Pyrrolidine 10 mg daily.  6.  Nexium 40 mg daily.  7.  Sinemet 25/100 mg twice a day.  8.  Zyrtec 10 mg daily.  9.  Plavix 75 mg daily.  10. Lasix 40 mg p.o. daily x 7 days.  11. Potassium chloride 20 mEq p.o. daily x 7 days.  12. Tylox one tablet p.o. q.4h. p.r.n. pain.      KMD/MEDQ  D:  03/19/2005  T:  03/19/2005  Job:  [id number]

## 2011-04-24 NOTE — Procedures (Signed)
[facility]  Patient:    [name], [name]                        MRN: [id number] Proc. Date: 07/19/01 Adm. Date:  [id number] Attending:  Rafael Bihari CC:         Clois Dupes, M.D.   Procedure Report  PROCEDURE:  Colonoscopy.  INDICATION FOR PROCEDURE:  Right sided abdominal pain with negative workup to date also this procedure is to satisfy recommended screening for colon cancer.  DESCRIPTION OF PROCEDURE:  The patient was placed in the left lateral decubitus position then placed on the pulse monitor with continuous low flow oxygen delivered by nasal cannula. She was sedated with 50 mg IV Demerol and 6 mg IV Versed for the previous EGD and no further sedation was required for this procedure. The Olympus video colonoscope was inserted into the rectum and advanced to the cecum, confirmed by transillumination at McBurneys point and visualization of the ileocecal valve and appendiceal orifice. The prep was good. Within the cecum, there was seen an 8 mm sessile polyp which was fulgurated by hot biopsy. The ascending, transverse, and proximal descending and colon appeared normal with no further masses, polyps, diverticula or other mucosal abnormalities. In the distal descending and sigmoid colon, there were seen numerous scattered diverticula. The rectum appeared normal and retroflexed view of the anus revealed no obvious internal hemorrhoids. The colonoscope was then withdrawn and the patient returned to the recovery room in stable condition. The patient tolerated the procedure well and there were no immediate complications.  IMPRESSION: 1. Cecal polyp. 2. Sigmoid diverticulosis.  PLAN:  Await biopsy results to determine method and interval for future colon screening. DD:  07/19/01 TD:  07/19/01 Job: [id number]

## 2011-04-24 NOTE — H&P (Signed)
[place]. [facility]  Patient:    [name], [name]                        MRN: [id number] Adm. Date:  [id number] Attending:  Katy Apo CC:         Minus Breeding, M.D. [facility]                         History and Physical  CHIEF COMPLAINT:  Right upper quadrant abdominal pain.  HISTORY OF PRESENT ILLNESS:  Ms. Westenberger is a 77 year old female who has known cholelithiasis and biliary colic.  She is seen by my partner, Dr. Fenton Malling. Lucia Gaskins, and felt that she had an indication for a cholecystectomy; however, had some coronary artery disease.  She underwent an evaluation for that here recently and was found to have multiple lesions on that.  She underwent a PTCA and stent placements.  She has been seen by the cardiologist here in the emergency room and a cardiac source for this discomfort is ruled out, although it is fairly classic biliary colic.  They think that she is clear for surgery.  I have come down to ee her.  She has not had any nausea, although she has had previous episodes.  She denies fever or chills.  This occurred after she ate meatloaf and onions last night.  She has some relief of pain with parenteral narcotics.  PAST MEDICAL HISTORY: 1. Hypertension. 2. Hyperlipidemia. 3. Diabetes mellitus, insulin-dependent now. 4. Coronary artery disease. 5. Cholelithiasis. 6. Parkinsons disease.  PAST SURGICAL HISTORY: 1. Total abdominal hysterectomy. 2. Bilateral salpingo-oophorectomy. 3. Appendectomy, according to her.  ALLERGIES:  None reported.  CURRENT MEDICATIONS:  1. Metoprolol.  2. Lisinopril.  3. Premarin.  4. Carbidopa/levodopa.  5. Cardizem CD.  6. Prevacid.  7. Imdur.  8. Insulin.  9. Coated aspirin. 10. Plavix.  SOCIAL HISTORY:  She denies alcohol or tobacco use.  REVIEW OF SYSTEMS:  Cardiovascular:  As above.  Pulmonary:  She has had pneumonia in the past.  She denies emphysema, tuberculosis, or asthma.  GI:  She  denies peptic ulcer disease, hepatitis, diverticulitis, but does report a hiatal hernia. Hematologic:  She denies any bleeding disorders, transfusions, or deep vein thrombosis.  Neurologic:  She has Parkinsons disease.  Some question of whether  she may have had a stroke.  No seizure disorder.  PHYSICAL EXAMINATION:  GENERAL:  Obese female, who appears to be slightly uncomfortable, holding her right upper quadrant.  VITAL SIGNS:  Temperature 97.1 degrees, blood pressure 131/76, pulse 73.  SKIN:  Warm and dry without jaundice.  HEENT:  Eyes:  Extraocular motions intact.  Sclerae clear.  NECK:  Supple without palpable masses.  CARDIOVASCULAR:  Heart demonstrates a regular rate and rhythm.  CHEST/RESPIRATORY:  Breath sounds equal and clear.  Respirations unlabored.  ABDOMEN:  Soft, obese.  There is mild to moderate right upper quadrant tenderness, but no guarding.  She has a lower midline scar present.  LABORATORY DATA:  Her liver function tests, her white blood cell count, and lipase were within normal limits.  Cardiac enzymes negative.  Chest x-ray shows some mild cardiomegaly with mild vascular congestion.  IMPRESSION:  Recurrent and severe biliary colic in a diabetic with known cholelithiasis.  It is possible that this may be subacute cholecystitis.  She is only getting mild relief from parenteral narcotics.  PLAN:  Laparoscopic cholecystectomy.  I have explained  the procedure and the risks, including, but not limited to bleeding, infection, bile duct injury, bile leak, small intestinal injury, as well as the risks of general anesthetic.  She seems to understand and agrees to proceed. Postoperatively we will get a back on her cardiovascular medications and on a sliding scale insulin. DD:  02/29/00 TD:  02/29/00 Job: [id number]

## 2011-04-24 NOTE — Cardiovascular Report (Signed)
[place]. [facility]  Patient:    [name], [name]                        MRN: [id number] Proc. Date: 08/06/00 Adm. Date:  [id number] Attending:  Minus Breeding CC:         [name], N.P.   Cardiac Catheterization  DATE OF BIRTH:  Mar 06, 1931.  PRIMARY CARE Falen Lehrmann:  [name], N.P.  PROCEDURE:  Left heart catheterization/coronary arteriography.  INDICATION:  Evaluate patient with coronary artery disease and multiple percutaneous interventions including three stents placed in the RCA.  PROCEDURAL NOTE:  Left heart catheterization was performed via the right femoral artery.  The artery was cannulated using an anterior wall puncture.  A #6-French arterial sheath was inserted via the modified Seldinger technique. Preformed Judkins and a pigtail catheter were utilized.  Patient tolerated the procedure well and left the lab in stable condition.  RESULTS Hemodynamics:  LV 188/20, Ao 188/90.  Coronaries:  The left main had 25% midstenosis.  The LAD was a calcified vessel with diffuse proximal and mid 25% and 30% lesions.  It was somewhat narrow in caliber.  There was a mid focal 50% lesion.  A first diagonal was a long but narrow vessel with ostial 40% stenosis.  A second diagonal was small with diffuse disease.  The circumflex had a proximal 25% stenosis in the A-V groove.  An OM-1 had luminal irregularities at the previous PTCA site.  An OM-2 had mid 40% stenosis at a previous PTCA site.  The right coronary artery was a dominant vessel.  It had a mid 80%, followed by a 100% occlusion.  The RV branch had 99% ostial stenosis.  There were left-to-right collaterals predominantly from the circumflex.  Left ventriculogram:  The left ventriculogram was obtained in the RAO and LAO projections.  The EF was 60% with no significant wall motion abnormalities.  CONCLUSION:  Severe single-vessel coronary artery disease with occluded right coronary  artery.  Nonobstructive disease elsewhere.  This appears to be a chronic occlusion, judging by her symptom onset two months ago and the extent of collateralization.  There is good wall motion preserved by the collaterals. Given the length of the occlusion, its chronic appearance and the collaterals, the chance for successful percutaneous revascularization with long-term patency is slim; therefore, she should be managed medically with anti-anginals and aggressive secondary risk factor modification. DD:  08/06/00 TD:  08/06/00 Job: [id number]

## 2011-04-24 NOTE — Discharge Summary (Signed)
[facility]  Patient:    [name], [name] Visit Number: [id number] MRN: [id number]          Service Type: MED Location: 2A [id number] Attending Physician:  Beckey Rutter Dictated by:   Amalia Hailey, M.D. Admit Date:  05/06/2002 Discharge Date: 05/10/2002   CC:         Minus Breeding, M.D., [place], [facility], [place]. [phone]) [facility]   Discharge Summary  CONSULTATIONS THIS ADMISSION:  Jenell Milliner, M.D., cardiology.  DISCHARGE DIAGNOSES:  1. Chest pain felt not to be cardiac with a negative adenosine Cardiolite     during this hospitalization. She will follow up with Dr. Percival Spanish, her     cardiologist, as an outpatient.  2. Hypertension with systolic blood pressures ranging in the 170-180 at times     during this hospitalization. Her Cardizem was increased as were her     clonidine as noted below.  3. Hyperlipidemia with problems with Lipitor in the past and we started her     on 5 mg of Zocor per day during this hospitalization and this can followed     up by her primary care M.D. and Dr. Percival Spanish.  4. Insulin-dependent diabetes with blood sugars well controlled during this     hospitalization.  5. Anemia--started on iron supplements during this hospitalization.  6. A question of Parkinson disease. The patient is on low-dose Sinemet.     Question of how her Parkinson disease was diagnosed. Her Sinemet will be     continued as an outpatient for now.  7. Anxiety on p.r.n. Valium.  8. Depression on Paxil 10 mg per day and this was continued throughout this     hospitalization.  9. Degenerative joint disease on Celebrex and continued throughout this     hospitalization. 10. Left breast pain and questionable nodule but mammogram negative for any     abnormality during this hospitalization.  DISCHARGE MEDICATIONS:  1. Clonidine 0.2 mg one p.o. b.i.d. which is an increased dose.  2.  Cardizem 360 mg one p.o. q.d. which is an increased dose.  3. Zocor 5 mg per day which is a new medication.  4. Nu-Iron tabs 150 mg b.i.d.  5. Lisinopril 20 mg b.i.d.  6. Imdur 120 mg q.d.  7. Celebrex 200 mg q.d.  8. Valium 2 mg q.6 h. p.r.n.  9. Paxil 10 mg per day. 10. Sinemet 25/100 mg one b.i.d. 11. Zyrtec 10 mg each day. 12. Humulin insulin 60 units before breakfast each day. 13. Glucophage 500 mg in the a.m., 1000 mg in the p.m. 14. Antivert 25 mg every 8 hours as needed. 15. Coated Aspirin 325 mg per day.  DISCHARGE INSTRUCTIONS:  FOLLOWUP:  She will follow with Dr. Percival Spanish in two weeks. She will follow up with [name], the family nurse practitioner, on Monday, May 15, 2002.  DIET:  Low salt 1800-calorie ADA.  ACTIVITY:  No strenuous activity and no heavy lifting.  HISTORY AND PHYSICAL:  Please refer to the dictated History and Physical.  LABORATORY AND ACCESSORY DATA:  The patient ruled out for myocardial infarction by enzymes. Her adenosine Cardiolite was negative for any evidence of ischemia. Her EKG showed sinus rhythm with ST depression and T-wave inversion in the lateral leads. She also had some T-wave inversions in her inferior leads along with some very questionable ST depression, but again that is very questionable. She did have a mammogram done this hospitalization  which showed no evidence of any malignancy. Her chest x-ray showed cardiomegaly, no active chest disease. Her KUB showed no evidence of obstruction. She did have some very mild cardiomegaly.  Her CBC showed a white cell count of 4.8, hemoglobin low at 10.9, platelets 239,000 with an MCV of 81.6. Her PT and PTT were normal. Her chemistry panel was totally normal except for a blood sugar of 155 and albumin slightly decreased at 3.3. Her liver function tests were all normal. Her BUN was 14 with a creatinine of 0.9. Her glycosylated hemoglobin A1C was 7.6. Her lipid profile showed a total  cholesterol of 231 with triglycerides at 173, HDL 57, LDL elevated at 139. Her TSH was 2.1, normal. Her total iron was 38 which is low with a percent saturation of 10 which is low, and her ferratin low at 2.  HOSPITAL COURSE: #1 - CHEST PAIN:  On admission, it was unclear of the etiology for her chest pain. She does have a known history of coronary artery disease. She was admitted to telemetry, ruled out for myocardial infarction by enzymes. Cardiology consultation was obtained and they felt that she would be a candidate for nuclear medicine stress test instead of cardiac catheterization at this time. Nuclear medicine stress testing was done on May 09, 2002. Results were available on May 10, 2002 which was negative for any evidence of ischemia. Dr. Verl Blalock felt that she could safely followup with her cardiologist in [place] in two weeks. Beta blocker may be needed in the future but can always be added in the future per Dr. Yolanda Bonine notes.  #2 - HYPERTENSION:  Her blood pressure was elevated during this admission which was found in the 170s to 180s at times. Her Cardizem was increased from 240 mg per day to 360 mg per day and her clonidine was increased from 0.2 mg q.d. to b.i.d. dosing. Will have her follow up with her primary M.D. the first of next week, on Monday, for blood pressure checks.  #3 - DIABETES:  Her blood sugars were relatively stable mostly between the 100-200 range during this hospitalization. Her insulin was continued but her glycosylated hemoglobin was slighted elevated as noted above. She was continued on her Glucophage and these can be titrated as needed as an outpatient.  #4 - ANEMIA:  The patient was noted to have anemia during this hospitalization. She was started on iron supplements as her iron studies were consistent with iron deficiency anemia. She has had a workup for this very recently with an EGD and colonoscopy according to the notes approximately 1-2  years  ago. We did not feel that this needed to be repeated at this time. She can have her hemoglobin followed up by her primary care doctor as an outpatient.  #5 - LEFT BREAST PAIN:  This was noted on admission and Dr. Hillery Jacks felt a questionable nodule on exam. Mammogram was ordered but this mammogram was completely normal. There were no calcifications or masses noted.  #6 - QUESTIONABLE PARKINSON DISEASE:  The patient was on a small dose of Sinemet on admission. She does not seem to have any clinical evidence of Parkinson disease at this time but we decided to continue her Sinemet as an outpatient. I will leave further workup for Parkinson disease to her primary care M.D.  #7 - ANXIETY:  This seemed well controlled throughout this hospitalization.  #8 - HYPERLIPIDEMIA:  The patient did have high cholesterol and high LDL as noted above. She apparently has  had a problem with Lipitor in the past. We decided to initiate Zocor 5 mg per day. This can be followed up by her primary M.D. and cardiologist as an outpatient.  #9 - DEGENERATIVE JOINT DISEASE:  She was maintained on her Celebrex throughout this hospitalization. Dictated by:   Amalia Hailey, M.D. Attending Physician:  Beckey Rutter DD:  05/10/02 TD:  05/12/02 Job: [id number]

## 2011-04-24 NOTE — H&P (Signed)
NAMEJIAN, LEMARR               ACCOUNT NO.:  [id number]   MEDICAL RECORD NO.:  [id number]          PATIENT TYPE:  INP   LOCATION:  57                         FACILITY:  [facility]   PHYSICIAN:  Champ Mungo. Lovena Le, M.D.  DATE OF BIRTH:  18-Jan-1931   DATE OF ADMISSION:  02/25/2005  DATE OF DISCHARGE:                                HISTORY & PHYSICAL   Ms. Huaracha is admitted to the hospital for evaluation of chest pain.   CHIEF COMPLAINT:  I am hurting in my chest.   HISTORY OF PRESENT ILLNESS:  The patient is a 77 year old woman with known  coronary artery disease, status post angioplasty and stenting in the 1990's.  She has longstanding diabetes and hypertension of over 20 years.  She also  has a history of obesity and is status post hysterectomy and status post  cholecystectomy.  She has dyslipidemia.  The patient also has decreased  visual acuity and is status post CVA.  She was in her usual state of health  until this morning when she developed substernal chest pain radiating to the  left arm and chest.  The pain was quite severe.  The pain was similar to the  pain that she experienced when she had her stents placed many years ago.  The pain lasted 2 hours and was relieved with nitrates.  It was associated  with shortness of breath.  There was no nausea and vomiting.  She did note  diaphoresis with the pain.  The pain has now totally resolved.  The patient  also notes some arthritic type arm pain which is not related to her chest  pain.  She is admitted for additional evaluation.   PAST MEDICAL HISTORY:  As previously noted.  She has a history of a stroke  with decreased visual acuity.   SOCIAL HISTORY:  The patient denies tobacco or ethanol use.   FAMILY HISTORY:  Notable for her mother dying in childbirth and a father who  she never knew.  She has 11 siblings, all but three have deceased.  Some  with cancer, some with coronary artery disease.  She has one son who died in  his  35's.   REVIEW OF SYSTEMS:  Notable for decreased visual acuity.  She denies any  hearing problems.  She does have nerve problems.  She denies nausea,  vomiting, diarrhea, or constipation.  She denies polyuria, polydipsia, heat  or cold intolerance, recent skin changes.  She has chest pain as previously  noted.  The rest of her review of systems was negative.   PHYSICAL EXAMINATION:  GENERAL:  She is a pleasant, chronically ill-  appearing obese woman in no distress.  VITAL SIGNS:  Blood pressure was 174/93, pulse was 72 and regular,  respirations were 22.  HEENT:  Normocephalic and atraumatic.  Pupils equal, round, and reactive to  light.  Oropharynx was moist.  Sclerae anicteric.  NECK:  No jugular venous distention.  There was no thyromegaly.  Trachea was  midline.  LUNGS:  Clear bilaterally to auscultation.  HEART:  Regular rate and rhythm with an  S4 gallop.  ABDOMEN:  Obese, nontender, and nondistended.  There was no obvious  organomegaly.  EXTREMITIES:  Decreased pulses in her lower extremities.  NEUROLOGY:  Alert and oriented x3 with cranial nerves intact.  Strength was  4+/5 and symmetric.   EKG demonstrates sinus rhythm with nonspecific lateral T wave abnormality.   IMPRESSION:  1.  Chest pain with typical as well as atypical features for coronary artery      disease.  2.  Longstanding coronary artery disease status post angioplasty and      stenting in the 1990's.  I do not have details of these procedures at      the present time.  3.  Diabetes.  4.  Hypertension.  5.  Obesity.  6.  Cerebrovascular disease.   DISCUSSION:  Ms. Cardile has multiple risk factors for coronary artery  disease and her symptoms are fairly typical for coronary artery disease and  unstable angina.  Will plan to obtain old records.  Will obtain serial  cardiac enzymes and repeat her EKG and likely proceed with a catheterization  in the next day or two.      GWT/MEDQ  D:  02/25/2005  T:   02/26/2005  Job:  [id number]   cc:   Minus Breeding, M.D.

## 2011-04-24 NOTE — Discharge Summary (Signed)
[place]. [facility]  Patient:    [name], [name]                        MRN: [id number] Adm. Date:  [id number] Disc. Date: 02/27/00 Attending:  Allene Dillon Dictator:   Mannie Stabile, P.A. CC:         Olivia Mackie, N.P., of [place]                  Referring Physician Discharge [place]  PROCEDURES:  Multiple PCIs of RCA/OM - February 26, 2000.  REASON FOR ADMISSION:  Please refer to the dictated admission note.  LABORATORY DATA:  (Preadmission) Normal CBC.  Metabolic profile - Elevated glucose at 207.  Cardiac enzymes negative x 2.  HOSPITAL COURSE:  Patient was admitted for diagnostic coronary angiogram following abnormal Persantine Cardiolite, which had been performed March 19.  This was notable for moderate inferior ischemia; EF of 61%.  Patient did have prior cardiac history notable for multiple prior PCIs, December 1999 and June 2000.  Patient underwent elective coronary angiogram, by Dr. Jerilynn Mages. Pulsipher (see catheterization report for full details), and was noted to have three-vessel CAD with normal LVF (EF greater than 65%).  Dr. Vicenta Aly proceeded with multiple PCIs of the RCA, as well as PTCA of the OM-3.  Dr. Vicenta Aly indicated following results of the RCA:  Mid 60% lesion to 20% residual; distal 100% instent to 0% residual; distal 95% after stent to 0% residual; distal 75% at PDA to 25% residual.  Regarding the OM-3 lesion:  The 99% stenosis was reduced to less than 20% residual.  No complications were noted.  Integrilin was infused for 48 hours.  Plavix x 4 weeks.  Of note, Dr. Vicenta Aly recommended that patient proceed with elective cholecystectomy following completion of her Plavix.  DISCHARGE MEDICATIONS: 1. Metoprolol 50 mg b.i.d. 2. Lisinopril 20 mg b.i.d. 3. Premarin 0.625 mg q.d. 4. Carbidopa/levodopa 25/100 mg b.i.d. 5. Cardizem CD 180 mg q.d. 6. Prevacid 30 mg q.d. 7. Imdur 90 mg q.d. 8. Insulin  as previously directed. 9. Coated aspirin 325 mg q.d.  DISCHARGE DIAGNOSES: 1. Coronary artery disease progression.    a. Recent abnormal Persantine Cardiolite.    b. Elective cardiac catheterization February 26, 2000:  Status post multiple       percutaneous coronary interventions of right coronary artery; percutaneous       transluminal coronary angioplasty of third obtuse marginal.    c. Normal left ventricular function.    d. Status post prior percutaneous coronary interventions - December 1999 and  June 2000. 2. Cholelithiasis.    Awaiting elective cholecystectomy. 3. Parkinsons disease. 4. Hypertension. 5. Diabetes mellitus. DD:  02/27/00 TD:  02/27/00 Job: [id number]

## 2011-04-24 NOTE — Consult Note (Signed)
[name], [name] NO.:  [id number]   MEDICAL RECORD NO.:  [id number]          PATIENT TYPE:  INP   LOCATION:  59                         FACILITY:  [facility]   PHYSICIAN:  Lanelle Bal, MD    DATE OF BIRTH:  [birth year]   DATE OF CONSULTATION:  DATE OF DISCHARGE:                                   CONSULTATION   REQUESTING PHYSICIAN:  Junious Silk, M.D.   PRIMARY CARDIOLOGIST:  Minus Breeding, M.D.   PRIMARY CARE PHYSICIAN:  Chipper Herb, M.D., [place].   REASON FOR CONSULTATION:  Heart trouble.   HISTORY OF PRESENT ILLNESS:  Patient is a 77 year old female who has had off-  and-on chest pain for at least the past 5-6 months.  She notes that over the  past month, she has had worsening discomfort in her chest that radiates into  the arm and down into her hand.  Easily relieved with rest.  She does not  take nitroglycerin.  It is associated with some diaphoresis and some  shortness of breath.  She has had 5-6 previous hospitalizations for chest  pain, including a history of stent placement in March, 2001, in which she  had a cutting balloon angioplasty and stent placement.  Troponins were not  elevated on this admission.   Cardiac risk factors include hypertension, hyperlipidemia, type 2 diabetes  x10 years.  She is unsure of her hemoglobin A1C.  She is a nonsmoker.   FAMILY HISTORY:  She did not know her father.  Her mother died in  childbirth.  She has had one brother with coronary artery disease, and her  son recently died with myocardial infarction.  She has had a history of  previous stroke which left her with a residual of decreased vision.  She  denies claudication.  Denies renal insufficiency.   PAST MEDICAL HISTORY:  1.  Stroke approximately three years ago with decreased vision.  2.  Positive history of Parkinson's, although the patient notes that she was      recently told by her neurologist that she did  not have Parkinson's but      over numerous years had been taking antiparkinson's medications.   PAST SURGICAL HISTORY:  1.  Total abdominal hysterectomy.  2.  Appendectomy.  3.  Cholecystectomy.   SOCIAL HISTORY:  Patient is a widow.  Lives with her daughter.   MEDICATIONS ON ADMISSION:  The medicines she continues on include paroxetine  10 mg daily, lisinopril 20 mg b.i.d., Valium 2 mg q.6h. p.r.n., Nexium 40 mg  a day, carbidopa/levo 25/100 p.o. b.i.d., Zyrtec 10 mg a day, Humulin N 63  units daily, metformin 500 q.a.m. and 1000 q.p.m., aspirin 81 mg a day,  Plavix 75 mg daily, Vytorin 10/40 daily.   DRUG ALLERGIES:  She is intolerant of DARVOCET, which causes confusion.   REVIEW OF SYSTEMS:  Positive for chest pain, exertional shortness of breath,  palpitations, and lower extremity edema, left greater than right.  GENERAL:  Patient notes seasonal allergies.  Denies nosebleeds.  Denies hemoptysis.  Denies urinary frequency.  Does have a constant tremor.  Denies  claudication.  Has decreased vision.  Wears dentures.   PHYSICAL EXAMINATION:  VITAL SIGNS:  Patient's blood pressure is noted to be  elevated at 160/90.  Some recordings are as high as 200.  Temp 98, heart  rate 70, respiratory rate 20, O2 sat on room air is 92%.  GENERAL:  Patient is awake and alert.  Neurologically intact.  Able to relay  her history.  HEENT:  Pupils are equal, round and reactive to light.  NECK:  Without carotid bruits.  LUNGS:  Clear bilaterally.  HEART:  Regular rate and rhythm without murmur or gallop.  ABDOMEN:  Benign without palpable masses.  EXTREMITIES:  The lower extremities have mild edema bilaterally with  decreased sensation at the feet.  She has +1 posterior tibial pulses  bilaterally.  No dorsalis pedis pulses are palpable.  She has no palpable  lymph nodes.  There are no ischemic changes in the lower extremities.   Cardiac catheterization films are reviewed and reveal a total  occlusion of  the right coronary artery with collateral filling of the PDPL, which looked  like they are diffusely diseased.  There is an 80% stenosis and a large  first OM.  The distal circumflex are small branches.  She has diffuse  disease throughout the LAD with a long mid 80% lesion.  Overall ejection  fraction is preserved.   With the patient's symptoms and three-vessel coronary artery disease,  coronary artery bypass grafting is recommended.  Patient agrees with  surgery.  Description of the surgery, including risks of death, infection,  stroke, myocardial infarction, bleeding, blood transfusion, are all  discussed with the patient in detail, and she is agreeable with proceeding.  She has been on Plavix and will need to be off Plavix for seven days prior  to surgery.  We will make arrangements for surgery late next week or early  the following week, depending on the schedule.  Please notify us if she is  to be discharged before that time.      EG/MEDQ  D:  02/27/2005  T:  02/27/2005  Job:  [id number]   cc:   Junious Silk, M.D. [facility]   Minus Breeding, M.D.   Chipper Herb, M.D.  [address] [place]  [place] [address]  Fax: [phone]

## 2011-04-24 NOTE — H&P (Signed)
NAME:  [name], [name] NO.:  [id number]   MEDICAL RECORD NO.:  [id number]                   PATIENT TYPE:  INP   LOCATION:  A228                                 FACILITY:  [facility]   PHYSICIAN:  Naomie Dean, M.D.                  DATE OF BIRTH:  10-29-1931   DATE OF ADMISSION:  10/14/2002  DATE OF DISCHARGE:                                HISTORY & PHYSICAL   PRIMARY CARE PHYSICIAN:  Chipper Herb, M.D., [place].   CHIEF COMPLAINT:  My chest was hurting.   HISTORY OF PRESENT ILLNESS:  The patient is a 77 year old lady with a  history of hypertension, diabetes mellitus type 2, coronary artery disease,  status post angioplasty and stent placement.  She was well until last night  when she had an acute onset of left sided chest pain while lying in bed.  She described the pain as a throbbing pain which radiated to her left arm.  It initially was 5/10.  There was no associated nausea, shortness of breath,  palpitations, or dizziness.  She said her pain has since reduced since she  came to the emergency room.  She went to see her primary doctor that  afternoon who requested that she go the emergency room for evaluation.   REVIEW OF SYSTEMS:  GENERAL:  She denies any fever or weakness.  RESPIRATORY:  She says she had a flu-like illness recently and admitted to  shortness of breath on and off.  CARDIOVASCULAR:  She admits to chest pain, denies any palpitations.  GU:  She denies any dysuria.  GI:  She denies any nausea, vomiting, or diarrhea.  CNS:  She denies any weakness, denies any dizziness.   PAST MEDICAL HISTORY:  1. Hypertension.  2. Diabetes mellitus type 2.  3. CVA.  4. Coronary artery disease.  5. Parkinson's disease.  6. Allergic rhinitis.  7. Arthritis.  8. History of cholecystectomy.   MEDICATIONS:  1. Glucophage 500 mg q.12h.  2. Imdur 120 mg q.d.  3. Zyrtec 10 mg q.d.  4. Zestril 20 mg q.d.  5. Zocor 4  mg q.d.  6. Nexium 40 mg q.d.  7. Valium 2 mg q.6.h.  8. Celebrex 200 mg b.i.d.  9. Sinemet 25/100 mg q.d. for Parkinson's disease.   FAMILY HISTORY:  Family history is significant for heart disease and cancer  in her brother.   ALLERGIES:  She says she is allergic to PAIN MEDICATION, especially narcotic  pain medication; she gets confused when she takes these pain medications.   SOCIAL HISTORY:  She is a widow with four grownup children.  She does not  smoke and does not drink alcohol.   PHYSICAL EXAMINATION:  VITAL SIGNS:  Blood pressure 143/60 with heart rate  77.  The patient was afebrile.  GENERAL:  This was an elderly lady not in any apparent  distress.  She had  slow speech but speech was not slurred.  NECK:  Supple with no jugular venous distention.  HEAD, EARS, NOSE, AND THROAT:  She was not pale.  She was an anicteric.  She  had dentures.  Overall, mucosa was moist.  CHEST:  She did not have any tenderness on the anterior chest wall.  Air  entry was adequate bilaterally.  Breath sounds were heard.  There were no  wheezes.  There was no crepitation.  CARDIOVASCULAR:  Heart sounds 1 and 2 were heard with regular rate and  rhythm.  No murmurs or rubs were appreciated.  ABDOMEN:  Obese, soft, nontender.  Bowel sounds were present.  CNS:  She was alert and oriented x 3.  She had no gross focal deficits.  She  complained of poor vision in the left eye; however, the patient was not  tested at this time.  EXTREMITIES:  She has no pedal edema.   LABORATORY DATA:  WBC 6.0, hemoglobin 10.9, hematocrit 33, MCV 79, platelet  count 236, neutrophil count 59%, lymphocyte 5%, monocyte 4%.  Sodium 139,  potassium 4.9, chloride 103, CO2 28, BUN 13, creatinine 0.7, glucose 137,  calcium 9.5.  PT 12.3, PTT 21, INR 1.0.  Cardiac enzymes: CK 73.  Chest x-  ray was pending.  EKG was normal sinus rhythm of 67 beats per minute, normal  electrical axis, normal intervals.  She had some T wave  inversion in V5 and  V6.  She had ____________ on the EKG.   ASSESSMENT:  This is an elderly lady with hypertension, coronary artery  disease, diabetes mellitus, who presented with chest pain.  The patient will  be admitted and acute myocardial infarction will be ruled out with serial  cardiac enzymes and EKG.  She will also be put on telemetry for monitoring.  She will be started on aspirin 325 mg q.d.  Beta blocker will be given 25 mg  q.12h., to be held when heart rate is less than 60 beats per minute.  Her  Imdur will also be continued at 120 mg q.d.  Cardiology evaluation will be  called for further assessment to see whether angioplasty and stent are still  patent.  For the patient's hypertension, she will be continued on Zestril 10  mg q.d.  For her diabetes mellitus type 2, she will be continued on  Glucophage 500 mg q.12h.  She will be put on ___________ coverage.  For her  gastritis, she will be started on Nexium 40 mg q.d.  For her history of  Parkinson's disease, she will be continued on Sinemet 25/100 mg q.d.  For  the low MCV anemia, stool guaiac will be done and anemia workup will be done  at this time.  Further evaluation by GI for possible colonoscopy will also  be entertained.  The patient will be admitted to the telemetry service.  Further workup will depend on the patient's clinical course.                                               Naomie Dean, M.D.    DW/MEDQ  D:  10/14/2002  T:  10/14/2002  Job:  [id number]

## 2011-04-24 NOTE — Consult Note (Signed)
[facility]  Patient:    [name], [name] Visit Number: [id number] MRN: [id number]          Service Type: MED Location: 2A [id number] Attending Physician:  Prentiss Bells Dictated by:   Jenell Milliner, M.D. Proc. Date: 05/08/02 Admit Date:  05/06/2002   CC:         Minus Breeding, M.D. [facility] [place], [place]  Redge Gainer, M.D. - [facility], [place], [place]   Consultation Report  REASON FOR CONSULTATION:  We were asked by Dr. Joaquim Lai DeChurch to evaluate Ms. [name], a 77 year old white female admitted with chest pain.  HISTORY OF PRESENT ILLNESS:  [name] is a somewhat difficult historian. She has known coronary artery disease, and has had positive inferior ischemia noted on a Cardiolite prior to a cardiac catheterization in August 2001.  At that time she had a totally occluded right coronary artery with left to right collaterals.  She had nonobstructive disease otherwise.  The ejection fraction was 60%, and medical therapy was recommended.  She has had previous intervention of the obtuse marginal-I and obtuse marginal-II, and also the right coronary artery.  She is followed rather closely by Dr. Minus Breeding whom I talked with today. For the past two weeks she has had anterior wall chest tightness with exertion.  This is relieved with rest.  She has also had some increased shortness of breath.  She awoke on Friday evening at around midnight with some chest pain that went to her left arm.  She took three sublingual nitroglycerin with some improvement.  She dozed off to sleep, and woke up again the next morning.  It was still not completely gone.  She came to the emergency room that morning.  Her electrocardiogram shows diffuse ST-segment depression inferolaterally which is baseline.  Her cardiac enzymes have been negative.  PAST MEDICAL HISTORY 1. Remarkable for hypertension. 2. Anxiety. 3. History of  cerebrovascular disease. 4. Status post right posterior cerebral artery CVA. 5. Degenerative joint disease. 6. Diabetes mellitus type 2. 7. Hyperlipidemia, currently not on a statin because of a drug reaction,    according to her daughter. 8. Parkinsons disease. 9. Obesity.  She said that she was so anxious about the last cardiac catheterization that she almost had a stroke.  She would like to avoid this if at all possible.  CURRENT MEDICATIONS PRIOR TO ADMISSION  1. Lisinopril 20 mg p.o. b.i.d.  2. Clonidine 0.2 mg q.d.  3. Celebrex 200 mg q.d.  4. Imdur 120 mg q.d.  5. Valium 2 mg q.6h. p.r.n.  6. Glucophage 500 mg q.a.m., 1000 mg q. evening.  7. Paxil 10 mg q.d.  8. Zyrtec 10 mg q.d.  9. Antivert 25 mg p.r.n. 10. Nexium 40 mg q.d. 11. Cardizem CD 240 mg q.d. 12. Sinemet 25/100 mg b.i.d. 13. Humulin 63 units q.a.m. 14. Aspirin 325 mg q.d.  SOCIAL HISTORY:  She lives in [place].  She has been widowed for 15 years. She does not smoke or drink.  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  FAMILY HISTORY:  Noncontributory.  REVIEW OF SYSTEMS:  Noted in the evaluation form by Sharyl Nimrod, P.A.-C.  It is essentially unremarkable, as pertains to her history of present illness.  PHYSICAL EXAMINATION  VITAL SIGNS:  Blood pressure today 149/77, pulse 64 and regular.  She is in sinus rhythm.  Temperature 98.3 degrees, respirations 16 and unlabored.  She weighs 200.7 pounds.  GENERAL:  She is very pleasant.  NEUROLOGIC:  Grossly intact.  NECK:  No jugular venous distention.  Carotid upstrokes are equal bilaterally with a question of a subclavian bruit, versus a murmur in the left upper chest.  I think it is a murmur.  CARDIAC:  A 2/6 systolic murmur along the left sternal border.  She has an S4.  LUNGS:  Clear to auscultation and percussion.  ABDOMEN:  Soft, with good bowel sounds.  EXTREMITIES:  No clubbing, cyanosis, or edema.  Pulses were present.  A chest  x-ray shows cardiomegaly.  No acute cardiopulmonary disease.  Electrocardiogram shows diffuse ST-segment changes with LVH.  LABORATORY DATA:  Unremarkable except for noting that her CPK and troponins were negative x 3.  Her hemoglobin A1c was 7.6, creatinine 0.9.  Total cholesterol 231, HDL 57, LDL 139.  TSH is normal.  IMPRESSION 1. Probable angina by history.  Cardiac enzymes are negative.  The    electrocardiogram is unchanged.  She has known coronary artery disease    with a total right with left to right collaterals. 2. Normal left ventricular function. 3. Hypertension. 4. Diabetes mellitus type 2. 5. Hyperlipidemia, currently not on treatment.  Apparently there has been    a reaction to Lipitor in the past. 6. Obesity. 7. Anxiety disorder. 8. Parkinsons disease. 9. Cerebrovascular disease.  RECOMMENDATION:  I have spoken to Dr. Percival Spanish, her primary cardiologist. Dr. Percival Spanish has a high threshold to catheterize this lady, and she and her daughter do not want to have this done unless necessary.  I certainly agree with this.  PLAN 1. Adenosine Cardiolite tomorrow.  If she has anything outside of an    inferior wall ischemia, a repeat cardiac catheterization will be    recommended.  Otherwise, medical treatment will be continued. 2. Consider statin therapy with another agent.  She will follow up with Dr. Percival Spanish in two weeks in [place], if her Cardiolite is unchanged. Dictated by:   Jenell Milliner, M.D. Attending Physician:  Prentiss Bells DD:  05/08/02 TD:  05/09/02 Job: [id number]

## 2011-04-24 NOTE — H&P (Signed)
[facility]  Patient:    [name], [name]                        MRN: [id number] Adm. Date:  [id number] Attending:  Erick Blinks CC:         Minus Breeding, M.D. [facility]             Redge Gainer, M.D., [place], [place]                         History and Physical  REASON FOR ADMISSION:  Elective cholecystectomy.  HISTORY OF PRESENT ILLNESS:  This is a 77 year old female with known cholelithiasis and biliary colic.  She had been seen and it was felt that she needed cholecystectomy; however, she had a cardiac workup and ended up requiring a PTCA and stent placement.  She presented to the emergency department February 29, 2000, relatively soon after her PTCA and stent placement, and was having abdominal pain that was felt to be similar to biliary colic. She was admitted at that time and treated.  It was felt that she should not have an operation at that time, given that it was so close to her having her stent in.  She subsequently was released from the hospital, has recovered, and is now felt appropriate from a cardiac standpoint, as well as a general surgical standpoint, to proceed with cholecystectomy.  PAST MEDICAL HISTORY:  1. Parkinsons disease.  2. Coronary artery disease.  3. Insulin-dependent diabetes mellitus.  4. Hyperlipidemia.  5. Hypertension.  6. Cholelithiasis.  PAST SURGICAL HISTORY:  1. Total abdominal hysterectomy.  2. Bilateral salpingo-oophorectomy.  3. Appendectomy.  ALLERGIES:  None reported.  CURRENT MEDICATIONS:  1. Metoprolol.  2. Carbidopa/levodopa.  3. Restoril.  4. Plavix.  5. Prevacid.  6. Diltiazem.  7. Premarin.  8. Isosorbide.  9. Cyclobenzaprine p.r.n. 10. Aspirin. 11. Humulin N 60 units q.a.m. and 8 units q.p.m. 12. Nitroglycerin p.r.n.  SOCIAL HISTORY:  No tobacco or alcohol use.  REVIEW OF SYSTEMS:  Remarkable for a questionable stroke in the past.  No current cardiac problems or pulmonary  problems.  PHYSICAL EXAMINATION:  GENERAL:  She is an obese female but in no acute distress.  Very pleasant and cooperative.  VITAL SIGNS:  Temperature 97.6, blood pressure 180/96, pulse 80, respiratory rate 16, height 5 feet 6 inches, weight 210 pounds.  SKIN:  Warm and dry without rashes.  HEENT:  Eyes:  Extraocular motions intact.  Sclerae clear.  NECK:  Supple without masses.  CARDIOVASCULAR:  Heart demonstrates a regular rate and rhythm.  CHEST:  Breath sounds are equal and clear.  Respirations are unlabored.  ABDOMEN:  Soft, obese.  There is some mild right upper quadrant tenderness. There is a lower midline scar present.  LABORATORY DATA:  Normal liver function tests except for slightly low albumin at 3.2.  White blood cell count and hemoglobin within normal range.  INR normal.  Platelet count is 202,000.  IMPRESSION:  Recurrent biliary colic, most likely consistent with chronic calculus cholecystitis.  Ultrasound is remarkable for gallstones and normal common bile duct diameter.  PLAN:  Laparoscopic cholecystectomy with intraoperative cholangiogram.  The procedure and risks have been explained to her.  She seems to understand and agrees to proceed.  She has been offered aspirin and Plavix for 5-7 days. DD:  03/30/00 TD:  03/30/00 Job: [id number]

## 2011-04-24 NOTE — Procedures (Signed)
[facility]  Patient:    [name], [name]                        MRN: [id number] Proc. Date: 07/19/01 Adm. Date:  [id number] Attending:  Rafael Bihari CC:         Clois Dupes, M.D.   Procedure Report  PROCEDURE:  Esophagogastroduodenoscopy with polypectomy.  INDICATIONS FOR PROCEDURE:  Chronic right sided abdominal pain with history of cholecystectomy and no significant response to antipeptic medication.  DESCRIPTION OF PROCEDURE:  The patient was placed in the left lateral decubitus position then placed on the pulse monitor with continuous low flow oxygen delivered by nasal cannula. She was sedated with 50 mg IV Demerol and 6 mg IV Versed. The Olympus video endoscope was advanced under direct vision into the oropharynx and esophagus. The esophagus was straight and of normal caliber with the squamocolumnar line at 38 cm. There was on significant hiatal hernia, ring, stricture or other abnormality of the gastroesophageal junction. The stomach was entered and a small amount of liquid secretions were suctioned from the fundus. Retroflexed view of the cardia was unremarkable. Within the fundus of the stomach, there was seen an 8 mm sessile polyp which was fulgurated by hot biopsy. The remainder of the fundus, body, antrum and pylorus all appeared normal. The duodenum was entered and both the bulb and second portion were well inspected and appeared to be within normal limits. The scope was then withdrawn and the patient returned to the recovery room in stable condition. The patient tolerated the procedure well and there were no immediate complications.  IMPRESSION:  Small gastric polyp otherwise normal endoscopy.  PLAN:  Will proceed with screening colonoscopy as scheduled earlier. DD:  07/19/01 TD:  07/19/01 Job: [id number]

## 2011-04-24 NOTE — Op Note (Signed)
[name], [name]               ACCOUNT NO.:  [id number]   MEDICAL RECORD NO.:  [id number]          PATIENT TYPE:  AMB   LOCATION:  ENDO                         FACILITY:  [facility]   PHYSICIAN:  John C. Amedeo Plenty, M.D.    DATE OF BIRTH:  September 23, 1931   DATE OF PROCEDURE:  09/12/2004  DATE OF DISCHARGE:                                 OPERATIVE REPORT   PROCEDURE:  Colonoscopy.   INDICATION FOR PROCEDURE:  History of adenomatous colon polyps on initial  colonoscopy 3 years ago.   DESCRIPTION OF PROCEDURE:  The patient was placed in the left lateral  decubitus position and placed on the pulse monitor with continuous low-flow  oxygen delivered by nasal cannula.  She was sedated with 62.5 mcg IV  fentanyl and 6 mg IV Versed.  The Olympus video colonoscope was inserted  into the rectum and advanced to the cecum, confirmed by transillumination at  McBurney's point and visualization of the ileocecal valve and appendiceal  orifice.  The prep was good.  The cecum, ascending, transverse, and  descending colon all appeared normal with no masses, polyps, diverticula, or  other mucosal abnormalities.  Within the sigmoid colon, there were seen  several scattered diverticula, no other abnormalities.  The rectum appeared  normal, and retroflexed view of the anus revealed no obvious internal  hemorrhoids.  The scope was then withdrawn, and the patient returned to the  recovery room in stable condition.  She tolerated the procedure well, and  there were no immediate complications.   IMPRESSION:  1.  Diverticulosis.  2.  Otherwise, normal study.   PLAN:  Next colon screening in 5 years by colonoscopy.      [facility]/MEDQ  D:  09/12/2004  T:  09/12/2004  Job:  [id number]   cc:   Chipper Herb, M.D.  [place]  [place] [address]  Fax: [phone]

## 2011-04-24 NOTE — Discharge Summary (Signed)
[place]. [facility]  Patient:    TOMESHA, CORADO                        MRN: [id number] Adm. Date:  [id number] Disc. Date: [id number] Attending:  Allene Dillon Dictator:   Sharyl Nimrod, P.A.C. CC:         Allene Dillon, M.D. [facility]  Dr. Roseanna Rainbow, M.D. [facility])   Referring Physician Discharge [place]  DATE OF BIRTH:  04-12-2031  SUMMARY OF HISTORY:  Ms. Stenger is a 77 year old white female with known coronary artery disease, status post multiple stents to the RCA, angioplasty to the OM1 and OM2.  Her last catheterization was in August of 2001.  This showed a 25% left main, 25-30% LAD, 25% OM1, 40% RCA, 80-100% RCA at the prior stent with left to right collaterals, and EF of 60%.  Medical treatment was recommended.  Since her discharge, she has had two to three episodes of chest discomfort, one which awoke her at 1 a.m.  She described it as severe, radiating into her shoulder and associated with nausea, shortness of breath, lightheadedness, and diaphoresis.  EMS gave her some morphine and sublingual nitroglycerin.  In the ER, IV nitroglycerin relieved most of her discomfort and eventually became pain-free.  Other risk factors include diabetes, hypertension, and hyperlipidemia.  She also has a history of Parkinsons.  LABORATORY DATA:  Fasting lipids on January 16, 2001, showed a total cholesterol of 219, triglycerides 407, HDL 59, and LDL was not calculated. CKs and troponins were negative for myocardial infarction.  The admission sodium was 138, potassium 4.3, BUN 20, creatinine 0.8, glucose 213.  The albumin was slightly low at 2.8.  LFTs were within normal limits.  The hemoglobin was 11.2, hematocrit 33.9, normal indices, platelets 198, and WBC 8.7.  Prior to discharge, the hemoglobin was 11.8, hematocrit 34.5, normal indices, platelets 249, and WBC 6.0.  The chest x-ray did not show any abnormalities.  The abdominal  ultrasound showed status post cholecystectomy, otherwise normal.  The common bile duct was only 3.7 mm in diameter.  The right kidney was 10.5 cm and the left was 11.1 cm.  The EKG showed normal sinus rhythm and nonspecific ST-T wave changes.  HOSPITAL COURSE:  Ms. Winstead was admitted to 5100 and her home medications were continued.  It was felt that she would probably need heart catheterization.  However, enzymes and EKGs were negative for myocardial infarction.  By January 17, 2001, the patient stated that she did not want heart catheterization.  Her IV nitroglycerin was discontinue and Norvasc was added to her medical regimen on January 17, 2001, by Dorris Carnes, M.D., and her ambulation was increased.  It was noted that she had also complained of vague abdominal discomfort.  An ultrasound was performed with the previously mentioned results.  While in the hospital, sugars ranged from the 50s-160s. By January 18, 2001, after Carlena Bjornstad, M.D., reviewed the chart, it was felt that she could be discharged home.  DISCHARGE DIAGNOSES: 1. Unstable angina, continued medical treatment. 2. Hypertension. 3. Hyperlipidemia. 4. Insulin-dependent diabetes. 5. History of Parkinsons.  DISPOSITION:  She was discharged home and asked to continue her home medications.  DISCHARGE MEDICATIONS:  1. Lopressor 50 mg t.i.d.  2. Sinemet 25/100 mg b.i.d.  3. Zestril 20 mg b.i.d.  4. Plavix 75 mg q.d.  5. Coated aspirin 325 mg q.d.  6. Cardizem ER 180 mg q.d.  7. Prevacid 30 mg q.d.  8. Premarin 0.625 mg q.d.  9. Imdur 120 mg q.d. 10. Cyclobenzaprine 10 mg q.8h. as needed for mouth discomfort. 11. Humulin N Insulin 60 units q.a.m. and 8 units q.p.m. 12. She was given a new prescription for Norvasc 5 mg q.d. 13. Sublingual nitroglycerin 0.4 mg p.r.n.  DIET:  She was asked to maintain a low-salt, low-fat, low-cholesterol diet.  FOLLOW-UP:  To record her sugars at home and provide to Dr.  Laurance Flatten.  She was also asked to keep her appointment with a GI specialist that was already arranged in [place], [place], on January 27, 2001.  [place], [place], office will call her at home with a follow-up appointment with Minus Breeding, M.D.  At the time that she follows up with Dr. Percival Spanish, her hyperlipidemia should be addressed with consideration of adding a hypoglycemic agent to her medical regimen. DD:  01/18/01 TD:  01/18/01 Job: [id number]

## 2011-04-24 NOTE — Discharge Summary (Signed)
NAME:  [name], [name] NO.:  [id number]   MEDICAL RECORD NO.:  [id number]                   PATIENT TYPE:  INP   LOCATION:  [id number]                                 FACILITY:  [facility]   PHYSICIAN:  Naomie Dean, M.D.                  DATE OF BIRTH:  02-19-1931   DATE OF ADMISSION:  10/14/2002  DATE OF DISCHARGE:  10/15/2002                                 DISCHARGE SUMMARY   HISTORY OF PRESENT ILLNESS:  This patient is a 77 year old lady with a  history of hypertension, diabetes mellitus, coronary artery disease, status  post angioplasty with stent placement.  She was referred through the  emergency room for admission by the ER MD because of chest pain.   PHYSICAL EXAMINATION:  Physical examination on admission was significant for  an essentially unremarkable exam.   Her blood work done on admission showed a sodium of 159, potassium of 4.9,  chloride of 103, CO2 of 28, BUN of 13, creatinine of 0.7, glucose of 157,  calcium of 9.5.  She had a WBC of 6.0, hemoglobin of 10.9, MCV of 79,  hematocrit of 33, platelets of 236.  Her EKG on admission was significant  for a normal sinus rhythm, normal intervals, normal elliptical axis.  She  had some T-wave inversion in V5 and V6.   HOSPITAL COURSE:  The patient was admitted for the cause of her chest pain  for MI to be ruled out.  MI was ruled out with serial cardiac enzymes.  The  patient was seen on rounds today and she has no new complaints.  Denies any  shortness of breath or dizziness.  Denies any palpitations or chest pain.  At the time of the ________the patient saw cardiology for further  evaluation.  The patient said, that she cannot wait for cardiology  evaluation at this time and would like to go home.  Discussed with the  patient to get an appointment [facility] for further evaluation of  cardiac status.   PHYSICAL EXAMINATION:  Her physical examination today is essentially  unremarkable.   Blood pressure is 165/76, heart rate of 62.   CONDITION ON DISCHARGE:  The patient's discharge condition is satisfactory.  The patient will be discharged back to her home.   DISCHARGE DIAGNOSES:  1. Chest pain.  2. Acute myocardial infarction ruled out.  3. Hypertension.  4. Coronary artery disease, status post stent placement.  5. Diabetes mellitus type II.  6. Parkinsonism.  7. A low MCV, anemia, and also gastritis.   DISCHARGE MEDICATIONS:  1. Metoprolol 25 mg b.i.d.  2. Imdur 120 every day.  3. Zestril 20 mg every day.  4. Glucophage 500 p.o. q.12h.  5. Nexium 40 mg every day.  6. Sinemet 25/100 every day.  7. Nu-Iron 150 b.i.d.  8. Zocor 5 mg every day.  9. Celebrex 200 mg b.i.d.  10.  Clonidine 0.2 mg b.i.d.   DISCHARGE INSTRUCTIONS:  The patient is to follow up with her primary MD and  also to follow up with [facility] as soon as she can for cardiology  evaluation.  Primary MD also to evaluate for low MCV anemia.                                               Naomie Dean, M.D.    DW/MEDQ  D:  10/15/2002  T:  10/16/2002  Job:  [id number]   cc:   Chipper Herb, M.D.  [address] [place]  [place] [address]  Fax: [phone]

## 2011-04-24 NOTE — Cardiovascular Report (Signed)
[name], [name]               ACCOUNT NO.:  [id number]   MEDICAL RECORD NO.:  [id number]          PATIENT TYPE:  INP   LOCATION:  [id number]                         FACILITY:  [facility]   PHYSICIAN:  Junious Silk, M.D. LHCDATE OF BIRTH:  1930-12-24   DATE OF PROCEDURE:  02/27/2005  DATE OF DISCHARGE:                              CARDIAC CATHETERIZATION   PROCEDURE PERFORMED:  Left heart catheterization with coronary angiography  and left ventriculography.   ATTENDING:  Junious Silk, M.D.   INDICATIONS:  The patient is 77 year old woman with history of diabetes and  previous coronary disease.  She has had multiple stents placed in her right  coronary artery.  Last catheterization in 2001 revealed an occlusion of her  distal right coronary which appeared chronic.  She has been managed  medically since then.  She presented to the hospital this time with chest  pain worrisome for unstable angina.  She ruled out myocardial infarction and  was referred for cardiac catheterization.   PROCEDURAL NOTE:  A 6-French sheath was placed in the right femoral artery.  Coronary angiography was performed with standard Judkins 6-French catheters.  Left ventriculography was performed with an angled pigtail catheter.  Contrast was Omnipaque.  There were no complications.   RESULTS:   HEMODYNAMICS:  Left ventricular pressure 180/24.  Aortic pressure 200/88.  There is no aortic valve gradient.   LEFT VENTRICULOGRAM:  Wall motion is normal, ejection fraction estimated at  60%. There is no mitral regurgitation.   CORONARY ARTERIOGRAPHY:  1.  The left main has an ostial 30% stenosis.  2.  Left anterior descending artery is diffusely diseased with a diffuse 50%      stenosis in the proximal mid-vessel. 3.  In the mid-to-distal vessel,      there is a diffuse 80% stenosis.  Further down the distal vessel, there      is a 20%stenosis.  The LAD gives rise to small first and second diagonal  branches and a normal-sized third diagonal branch.  The first diagonal      branch has an 80% stenosis at its ostium.  The second diagonal branch      has a 90% stenosis at its ostium.  3.  Left circumflex is a fairly large vessel giving rise to a small first      obtuse marginal, a large branching second obtuse marginal,  a normal-      sized branching third obtuse marginal and a small fourth obtuse marginal      branch.  There is a 20% stenosis in the mid-circumflex.  The second      obtuse marginal, which is a large vessel, has a very long 80% stenosis      extending beyond its bifurcation point.  4.  The right coronary is a dominant vessel.  There are multiple stents      throughout the proximal, mid and distal vessel.  There is diffuse in-      stent restenosis with a 70% stenosis proximally, a long 90% stenosis in      the mid-vessel and 100%  occlusion of the distal vessel.  The distal      right coronary consisting of a posterior descending artery and      posterolateral branch fills via left-to-right collaterals.   IMPRESSION:  1.  Normal left ventricular systolic function.  2.  Severe diffuse three-vessel coronary artery disease.   PLAN:  The patient will be referred for evaluation for coronary bypass  surgery.      MWP/MEDQ  D:  02/27/2005  T:  02/28/2005  Job:  [id number]   cc:   Olivia Mackie NP   Minus Breeding, M.D.   [facility]

## 2011-04-24 NOTE — H&P (Signed)
[facility]  Patient:    ARBOR, [name] Visit Number: [id number] MRN: [id number]          Service Type: Attending:  Beckey Rutter, M.D. Dictated by:   Beckey Rutter, M.D. Adm. Date:  05/06/02                           History and Physical  INCOMPLETE  DATE OF BIRTH:  1931/05/16  HISTORY OF PRESENT ILLNESS:  The patient is a 77 year old white female, with multiple known cardiovascular risk factors, who has coronary artery disease with last catheterization August 2001 and a history of multiple stents and angioplasties, although full details are unavailable at this time, who presented to the emergency room complaining of left anterior chest pain radiating into her shoulder and arm starting last p.m. which has progressed but has been quite steady.  She had no associated dyspnea, nausea or vomiting, but has had increased diaphoretic episodes over the preceding week, particularly with any activity, although there was not necessarily pain associated with these.  She has had a history of this in the past.  She was last admitted in February 2002 to [place] for the same.  At that time she refused catheterization.  She subsequently was admitted in October 2002 complaining of dizziness and exertional chest pain, and found to have had a CVA.  In any event, she presented today complaining of symptoms as noted above.  In addition she now complains of right upper quadrant pain which, according to the records and her daughter, has been chronic and she has had multiple evaluations of this at least over the past 18 months with no specific etiology being observed.  She has noticed some worsening of abdominal bloating and abdominal complaints in perhaps the last year.  She is followed by Dr. Teena Irani in [place], [place].  The patient is being admitted to the hospital for further evaluation of her chest pain  symptoms.  REVIEW OF SYSTEMS:  Pertinent for no exertional component, no change in her activity level.  She is independent with ADLs.  She has had no falls.  She does note some unsteadiness of gait.  She has some visual field loss secondary to her CVA in October 2002.  Diabetes mellitus control has been unknown.  She was having hypoglycemia with p.m. insulin.  Again, she has been on Metformin for about a year possibly.  No reflux per se.  No nausea or vomiting.  She has been told she has a spot on her liver and has a follow-up ultrasound scheduled in June 2003.  History of colon polyps, colonoscopy and endoscopy in the recent past.  She denies any history of anemia, no transfusion history.  She notes pain in her left breast present for several days.  This is a new problem for her, although she has noted intermittent breast pain in the past.  She had previously been on Premarin.  This was discontinued about six months ago, she states because her tongue was swelling.  She has had hot flashes since that time which wax and wane.  She also notes pain on the top of he           Figueroa head which intermittent and transient.  No bowel complaints.  She uses milk of magnesia every other day.  PAST MEDICAL HISTORY:  1. Diabetes mellitus, control unknown.  2. Status post multiple CVAs with MRI in  October 2002 revealing right     posterior cerebral artery distribution as well as cerebellar infarct.  3. Hyperlipidemia, not on any therapy.  4. Hypertension, "labile."  5. Chronic abdominal complaints, status post ultrasound, CT, MRI,     endoscopy and colonoscopy all within the last one to two years.  6. History of colon polypectomy, no known malignancy.  7. Parkinsons disease, on Sinemet for several years, though no evidence     and there was mention of discontinuing this medication per neurology     in October 2002.  8. Anxiety, for which she takes Valium and Paxil.  9. Degenerative joint disease of the  shoulders, spine, and knees. 10. Gastroesophageal reflux by history.  SOCIAL HISTORY:  She lives with her son and daughter.  She has been a widow x15 years.  No history of alcohol or tobacco use.  The patient is illiterate.  FAMILY HISTORY:  Eleven siblings.  One died of suicide.  Positive for colon cancer and multiple coronary artery disease events.  There are only three children remaining.  She has four children of her own and they all have diabetes.  MEDICATIONS:  1. Lisinopril 20 mg b.i.d.  2. Clonidine 0.2 mg q.d.  3. Celebrex 200 mg q.d.  4. Imdur 120 mg q.d.  5. Valium 2 mg q.6h p.Figueroa.n.  6. Metformin 500 mg q.a.m., 1000 mg q.p.m.  7. Paxil 10 mg q.d.  8. Zyrtec 10 mg q.d.  9. Antivert 25 mg p.Figueroa.n. 10. Nexium 40 mg q.d. 11. Cardizem CD 240 mg q.d. 12. Sinemet 25/100 mg b.i.d. 13. Humulin N 63 units q.a.m. 14. Aspirin 325 mg q.d. 15. Discharge summaries note:     a. Plavix.  (The family is unable to recall though they feel she may be on        Plavix still).     b. Toprol.  ALLERGIES:  She has GI intolerance to:  1. DARVOCET.  2. LEVSIN.  PHYSICAL EXAMINATION:  GENERAL:  Examination reveals an alert and oriented elderly female in no distress, O2 in place.  VITAL SIGNS:  Blood pressure 190/90, pulse is in the 60s and regular, respirations are unlabored at rest.  O2 saturation 99%.  NECK:  Supple.  There is no JVD, adenopathy, thyromegaly, or bruits.  LUNGS:  Clear to auscultation anteriorly and posteriorly.  HEART:  Regular rate and rhythm.  No murmur, gallop, or rub.  ABDOMEN:  Obese, soft, nontender.  Quite tympanitic.  EXTREMITIES:  Without clubbing, cyanosis, or edema.  Dorsalis pedis pulses are present bilaterally though slightly diminished.  Femoral pulses are present.  NEUROLOGIC:  The patient is alert and oriented, moves all extremities x4.  No cogwheeling or tremor noted.  She is able to move about easily in the bed.  ASSESSMENT/PLAN:  1. Chest  pain with known coronary artery disease, with other multiple      complaint at this time it is hard to know if this is cardiac certainly.     She does need to be monitored.  We discussed the options regarding     invasive versus noninvasive assessment given the fact that her chest pain     is an ongoing issue.  She states she would proceed with catheterization if     necessary, although is fearful of complications.  Apparently she had an     episode of hypertension during her last catheterization and this was very     distressing to her.  She has known good  left ventricular function on the     last echocardiogram in October 2002.  No reason to suspect deterioration.     Continue her current medical regimen and will have cardiology evaluate.  2. Hypertension, which is noted to be labile.  Will continue her current     regimen, certainly some tweaking might be reasonable.  3. Diabetes mellitus.  Check hemoglobin A1C, Accu-Cheks, and insulin,     although watch for hypoglycemia given she certainly has some degree of     insulin resistance.  Unclear why she is not on glitazone therapy.  4. Hyperlipidemia, not on treatment.  Again, unclear why.  Information from     primary care will be helpful.  5. Questionable Parkinsons disease.  No evidence of this.  She has not had     any medication today and indeed if she had obvious parkinsonism we should     see some signs of that by now.  She is on such a low dose of Sinemet I     cannot believe it is making a big difference.  Certainly with her     polypharmacy it would be reasonable to hold it and if there is no     difference would discontinue it.  She is on Paxil, which certainly can     exacerbate parkinsonian symptoms although, again, I am not seeing this so     I therefore am not going to discontinue the Paxil.  6. Anxiety by history.  She also has some increased social stressors noted     recently in her Review Of Systems.  Increasing her  Paxil to a therapeutic     dose would be reasonable and using the Valium sparingly.  7. Degenerative joint disease.  She remains on Celebrex.  Unclear if she has     had any evidence of gastritis which may account for some of her upper     quadrant pain.  She is on Nexium.  This should be continued cautiously.  8. Anemia with microcytic indices.  Check iron studies, thyroid, and     certainly a multivitamin with iron would be reasonable given her multiple     chronic diseases.  9. History of chronic constipation, on milk of magnesia.  Question if this     patient has a degree of irritable bowel syndrome.  She certainly fits theDictated by:   Beckey Rutter, M.D. Attending:  Beckey Rutter, M.D. DD:  05/06/02 TD:  05/07/02 Job: [id number]

## 2011-04-24 NOTE — Procedures (Signed)
[name], STAMLER               ACCOUNT NO.:  [id number]   MEDICAL RECORD NO.:  [id number]          PATIENT TYPE:  REC   LOCATION:  [facility]PC                          FACILITY:  [facility]   PHYSICIAN:  Charlett Blake, M.D.DATE OF BIRTH:  1931/02/26   DATE OF PROCEDURE:  08/22/2009  DATE OF DISCHARGE:                               OPERATIVE REPORT   This is a L3-L4 translaminar lumbar epidural steroid injection, right  paramedian approach.   INDICATION:  Lumbar radiculitis as far as lateral disk at L4-5 with L4  distribution radicular symptoms.  Pain is only partially responsive to  medication management other conservative care and interferes with  mobility including ambulation.   Informed consent was obtained after describing risks and benefits of the  procedure with the patient.  These include bleeding, bruising,  infection.  She elects to proceed and has given written consent.  The  patient placed prone on fluoroscopy table.  Betadine prep, sterile  drape, 25-gauge inch and needle was used to anesthetize the skin and  subcutaneous tissue 1% lidocaine x2 mL.  Then, a 18-gauge Tuohy needle  was inserted under fluoroscopic guidance starting at the L3-4 trans-  interlaminar space.  AP and lateral images utilized.  Once the posterior  elements were approximated on lateral imaging, switched to loss-of-  resistance technique using a 5050 air saline mix, possible loss  resistance obtained, and confirmed using Omnipaque 180 x2 mL showing  good epidural spread under live fluoro.  This was followed by injection  of 2 mL of 40 mg/mL Depo-Medrol and 2 mL of 1% MPF lidocaine.  The  patient tolerated the procedure well.  Pre injection pain level 9/10.  Post injection pain level 0/10.  She will see Dr. Arnoldo Morale back.  I will  be happy to see her again should this injection wear off.      Charlett Blake, M.D.  Electronically Signed     AEK/MEDQ  D:  08/22/2009 13:51:53  T:  08/23/2009  02:45:35  Job:  [id number]   cc:   Ophelia Charter, M.D.  Fax: [phone]   Chipper Herb, M.D.  Fax: [phone]

## 2011-04-24 NOTE — Assessment & Plan Note (Signed)
[place] OFFICE NOTE   NAME:[name], [name]                      MRN:          [id number]  DATE:07/28/2006                            DOB:          1931-06-21    PRIMARY:  [name].   REASON FOR PRESENTATION:  Evaluate patient for coronary disease.   HISTORY OF PRESENT ILLNESS:  The patient returns for followup.  She is now  77 years old.  She has done relatively well since I last saw her.  She has  not taken any nitroglycerin, though she occasionally gets some fleeting  chest discomfort.  She does not describe classic substernal chest pressure,  neck discomfort or arm discomfort.  She does not report an palpitations,  presyncope or syncope.  She thinks her breathing is relatively good.  She  denies any PND or orthopnea.  She did have a fall, not a syncopal episode,  recently.  She is following closely with Butch Penny and the pharmacist over at  [facility], though she still does not have good control of her blood  sugar.  Unfortunately, she had gained about 15 pounds since I saw her,  though she claims not to be eating much.   PAST MEDICAL HISTORY:  1. Coronary artery disease status post CABG (March 13, 2005, LIMA to the      LAD, SVG to the circumflex, SVG to the PDA).  Well preserved ejection      fraction.  2. Diabetes.  3. Hypertension.  4. Hyperlipidemia.  5. Hysterectomy.  6. Cholecystectomy.  7. Questionable Parkinson's disease.  8. Bilateral carotid artery stenosis.   REVIEW OF SYSTEMS:  As stated in the HPI, and otherwise negative for other  systems.   PHYSICAL EXAMINATION:  GENERAL:  The patient is in no distress.  VITAL SIGNS:  Blood pressure 162/91, heart rate 60 and regular, weight 215  pounds.  HEENT:  Eyes - unremarkable.  Pupils equal, round and reactive to light.  Fundi not visualized.  Oral mucosa unremarkable.  NECK:  No jugular venous distention.  ________ within  normal limits.  Carotid upstrokes brisk and symmetric.  Bilateral carotid bruits.  No  thyromegaly.  LYMPHATICS:  No cervical, axillary or inguinal adenopathy.  LUNGS:  Clear to auscultation bilaterally.  BACK:  No costovertebral angle tenderness.  CHEST:  Unremarkable, except for a well healed sternotomy scar.  HEART:  PMI not displaced or sustained.  S1 and S2 within normal limits.  No  S3, no S4, no murmurs.  ABDOMEN:  Obese.  Positive bowel sounds.  Normal in frequency and pitch.  No  bruits, rebound, guarding or midline pulse.  No masses, no organomegaly.  SKIN:  No rashes.  No _________.  EXTREMITIES:  There were 2+ upper pulses, 2+ posterior tibialis bilaterally.  Mild bilateral lower extremity edema.  No clubbing, cyanosis, or edema.  NEUROLOGIC:  Grossly intact.   ELECTROCARDIOGRAM:  EKG revealed sinus rhythm, rate 60.  Axis within normal  limits.  Intervals within normal limits.  Lateral and inferior ST  depression, unchanged from previous EKGs.  ASSESSMENT AND PLAN:  1. Coronary disease.  The patient is having no ongoing symptoms.  No      further cardiovascular testing is suggested.  At this point, will      continue with aggressive secondary risk reduction.  2. Carotid bruits/obstruction.  The patient has not had a Doppler in over      a year, and she is due for this.  Will send her down to the office to      get a carotid Doppler.  3. Obesity.  We had a discussion about the need to lose weight with diet      and exercise.  She states she does not eat very much, but I explained      to her that whatever calories she is getting is more than she requires      for the amount she is burning.  She and her daughter will hopefully      work on this.  4. Hypertension.  Blood pressure is slightly elevated.  I will have [name] keep an eye on this.  My suggest would be to consider adding      Norvasc at a low dose if further readings are elevated.  The goal would       be 120/70, given her diabetes.  5. Dyslipidemia.  She has a reasonable lipid profile, though not yet at      goal.  She is having this followed by the lipid clinic at [place]:  I will see her back in 12 months, or sooner if needed.                               Minus Breeding, MD, [facility]    JH/MedQ  DD:  07/28/2006  DT:  07/28/2006  Job #:  [id number]   cc:   Olivia Mackie

## 2011-04-24 NOTE — Op Note (Signed)
NAMEEMMERSYN, ANCTIL NO.:  [id number]   MEDICAL RECORD NO.:  [id number]          PATIENT TYPE:  INP   LOCATION:  2039                         FACILITY:  [facility]   PHYSICIAN:  Lanelle Bal, MD    DATE OF BIRTH:  [birth year]   DATE OF PROCEDURE:  03/13/2005  DATE OF DISCHARGE:                                 OPERATIVE REPORT   REFERRING PHYSICIAN:  Minus Breeding, M.D.   PREOPERATIVE DIAGNOSIS:  Coronary occlusive disease.   POSTOPERATIVE DIAGNOSIS:  Coronary occlusive disease.   PROCEDURE:  Coronary artery bypass grafting x3 with left internal mammary  artery to left anterior descending coronary artery, reverse saphenous vein  graft to the circumflex coronary artery distally and reverse saphenous vein  graft to the posterior descending coronary artery with EndoVein harvest.   SURGEON:  Lanelle Bal, M.D.   FIRST ASSISTANT:  Darlin Coco, PA.   BRIEF HISTORY:  Patient is a 77 year old diabetic female who presents with  increasing anginal symptoms.  Cardiac catheterization revealed significant  three vessel disease with total occlusion of the right coronary artery and  significant disease in the LAD and circumflex system.  Coronary artery  bypass grafting was recommended.  The patient agreed and signed informed  consent.   DESCRIPTION OF PROCEDURE:  With Swan-Ganz and arterial line monitors in  place, the patient underwent general endotracheal anesthesia without  incident.  Skin of the chest and legs was prepped with Betadine and draped  in the usual sterile manner.  Using the [employer] EndoVein Harvesting System,  vein was harvested from the right thigh and was of adequate quality and  caliber.  Median sternotomy was performed and left internal mammary artery  was dissected down as a pedicle graft.  The distal artery was divided and  had good free flow.  Pericardium was opened.  Overall ventricular function  appeared preserved.  Patient was  systemically heparinized.  The ascending  aorta and right atrium were cannulated and aortic root vent cardioplegia  needle was introduced into the ascending aorta.  Patient was placed on  cardiopulmonary 2.4 liters/minute/sq meter.  Sites of anastomosis were  inspected and were dissected out of the epicardium.  Patient body  temperature was cooled to 30 degrees.  Aortic crossclamp was applied, 500 mL  of cold blood potassium cardioplegia was administered with rapid diastolic  arrest of the heart.  Attention was turned first to the circumflex coronary  artery.  Distally the vessel was opened and admitted a 1 mm probe.  Vessel  was probably 1.3 to 1.4 mm in size.  Using a running 7-0 Prolene, distal  anastomosis was performed.  Attention was then turned to the posterior  descending coronary artery which was diffusely diseased vessel admitted a 1  mm probe using running 7-0 Prolene, distal anastomosis was performed.  Attention was then turned to the left anterior descending coronary artery  which was opened and admitted a 1.5 mm probe distally.  Using a running 8-0  Prolene, the left internal mammary artery was anastomosed to the left  anterior descending coronary artery.  With release  of the [employer] on  the mammary the anastomosis was free of bleeding.  The bulldog was placed  back on the mammary.  With crossclamp still in place, two punch aortotomies  were performed in the ascending aorta and each of the two vein grafts were  anastomosed to the ascending aorta.  Air was evacuated from grafts in the  ascending aorta as crossclamp was removed with total crossclamp time of 67  minutes.  Patient spontaneously converted to a sinus rhythm on low dose  dopamine.  She was then ventilated and weaned from cardiopulmonary bypass  without difficulty.  She remained hemodynamically stable and was  decannulated.  Total pump time was 95 minutes.  Sites of anastomosis were  free of bleeding.  Patient  was decannulated in usual fashion.  A left  pleural tube, two mediastinal tubes were left in place.  Pericardium was  loosely reapproximated.  Sternum was closed with #6 stainless steel wires.  Fascia closed with interrupted 0 Vicryl, running 3-0 Vicryl in subcutaneous  tissue and 4-0 subcuticular stitch in the skin edges, dry dressings were  applied.  Sponge and needle counts were reported as correct at completion of  the procedure.  The patient tolerated the procedure without obvious  complications and was transferred to surgical intensive care unit for  further postoperative care.      EG/MEDQ  D:  03/17/2005  T:  03/17/2005  Job:  [id number]   cc:   Minus Breeding, M.D.   Quay Burow, M.D.  Fax: [phone]

## 2011-04-24 NOTE — Cardiovascular Report (Signed)
[place]  Patient:    [name], [name]                        MRN: [id number] Proc. Date: 02/26/00 Adm. Date:  [id number] Attending:  Allene Dillon CC:         Olivia Mackie, RN             Minus Breeding, M.D. [facility]             Cardiac Catheterization Laboratory                        Cardiac Catheterization  PROCEDURES PERFORMED: 1. Left heart catheterization, coronary angiography and left ventriculography. 2. Percutaneous transluminal coronary angioplasty with utilization of a cutting    balloon in the mid to distal right coronary artery followed by stent placement    in the distal right coronary artery with balloon angioplasty of the distal    vessel. 3. Percutaneous transluminal coronary angioplasty of third obtuse marginal branch.  INDICATIONS:  [name] is a 77 year old woman with Parkinsons disease and diabetes mellitus.  She has undergone previous coronary intervention including TCA and stent placement in the mid right coronary artery.  She then had two additional stents placed in her right coronary artery for restenosis.  She also underwent angioplasty and obtuse marginal branch.  She is in need of cholecystectomy. Preoperative Cardiolite scan showed inferior ischemia.  CATHETERIZATION PROCEDURE:  A 6 French sheath was placed in the right femoral artery.  Standard Judkins 6 French catheters were utilized.  Contrast was Omnipaque.  There were no complications.  CATHETERIZATION RESULTS:  HEMODYNAMICS:  Left ventricular pressure 160/22.  Aortic pressure 160/79.  No aortic valve gradient.  LEFT VENTRICULOGRAM:  Wall motion appears normal.  Ejection fraction is greater  than 65%.  No mitral regurgitation.  CORONARY ARTERIOGRAPHY:  (Right dominant).  Left main:  The left main has an ostial 25% stenosis.  Left anterior descending:  The LAD has a proximal 30% stenosis.  The mid vessel has a 40% stenosis and the distal vessel  has a 60% stenosis.  Further down in the apical, LAD has a 50% stenosis.  There is a normal sized first diagonal, small second diagonal which shows a 50% stenosis proximally and then an 80% stenosis id. The third diagonal is also small and diffusely diseased to approximately 80% stenosis throughout.  The fourth diagonal is normal in size.  Left circumflex:  The left circumflex coronary gives rise to a small OM-1, a large branching OM-2, a small OM-3 and a small OM-4.  There is a 40% stenosis in OM-2.  I believe this is at a previous angioplasty site.  OM-3 has a 99% stenosis with TIMI-2 flow into the distal vessel.  Right coronary artery:  The right coronary artery is a large dominant vessel. he very proximal stent has a 30% stenosis within it.  The mid stent has a diffuse 0% restenosis.  The distal stent is 100% occluded with just a very trace flow going beyond the stent.  The distal right coronary artery is filled by left to right collaterals.  After angioplasty of the right coronary artery was performed, there was also found to be a 95% stenosis in the distal vessel after the third stent.  Further down in the distal vessel was a 75% stenosis before the bifurcation of he PDA.  After the bifurcation of the PDA in the continuation  the right coronary artery was a 60% stenosis.  The PDA itself was diffusely diseased with a diffuse 80% stenosis in the mid vessel, 60% distally.  There was a small first posterolateral branch and a large second posterolateral branch which had a diffuse 50% stenosis.  IMPRESSION: 1. Preserved left ventricular systolic function. 2. Diffusely diseased coronary arteries with three-vessel coronary artery disease.    The distal right coronary artery is totally occluded at the distal stent    and fills by left to right collaterals.  The obtuse marginal #3 is    subtotally occluded.  Previous percutaneous transluminal coronary angioplasty    site at  obtuse marginal #2 is patent.  There is moderate disease in the left    anterior descending and significant disease in the small diagonal branches.   PLAN:  The patient is not a good surgical candidate.  After review with Dr. Olevia Perches we opted to proceed with percutaneous intervention.  See below.  PERCUTANEOUS TRANSLUMINAL CORONARY ANGIOPLASTY PROCEDURE:  The patient was enrolled in the Cruise trial and randomized to be treated with Integrilin and heparin. e initially treated the right coronary artery.  We used an 8 [place] guiding catheter with side holes.  We also placed a 6 French venous sheath.  The lesion was successfully crossed with a Hi-Torque Floppy wire.  We then performed initial PTCA with a 2.5 x 20 OpenSail balloon which was inflated to 8 atmospheres within the  distal stent.  After we achieved perfusion in the distal vessel there was a long 95% stenosis distal to the stent and that was dilated with this balloon to 12 atmospheres.  There was then seen to be a 75% stenosis at the bifurcation of the PDA and that was dilated with this balloon at a pressure of 8 atmospheres.  We hen performed intravascular ultrasound, four-vessel sizing.  This showed the reference media to media diameter.  In the proximal, mid and distal vessel was approximately 4 mm.  The lumen diameter in the distal vessel beyond the stent was approximately 2.5 mm.  We then used a 3.5 x 15 mm cutting balloon.  This was advanced into the distal stent and multiple sequential inflations were performed, each to 6 atmospheres.  This balloon was then pulled back to within the distal portion of the mid stent and inflated again sequential to 6, 6 and then 8 atmospheres.  We then pulled it back slightly more proximally and went up again to 8 and then 10 atmospheres.  We then pulled the cutting balloon back into the most proximal stent and inflated it to 10 atmospheres.  There was significant improvement  at that point.  However, there is still residual stenosis beyond the distal stent.  We  therefore placed an additional stent distal to the third stent.  This was a 3.0 x 18 mm NIR Royal which was deployed at 11 atmospheres.  This resulted in an excellent result at that site.  However, just distal to the stent was a long 60% stenosis.  We used the stent delivery balloon and advanced it across this area nd inflated this balloon to 8 atmospheres.  We then pulled this balloon back to within the previously placed distal and mid stents and inflated it to 20, 18 and 18 atmospheres sequentially.  Final angiographic images revealed patency of the right coronary artery.  The mid right coronary had a residual 20% stenosis.  The distal 100% in-stent re-stenosis was reduced to 0%.  The distal  95 beyond the stent was reduced to 0% and the distal 75 at the posterior descending artery takeoff was reduced to 25% stenosis.  There was TIMI-3 flow into the distal vessel.  We then turned our attention at the obtuse marginal branch.  We used a 7 [place]  Voda left 3.5 guiding catheter and a Hi-Torque Floppy wire.  The lesion was treated with a 2.5 x 20 mm Ranger balloon which was inflated to 4 then 8 atmospheres. Angiographic images following this revealed less than 20% residual stenosis and  TIMI-3 flow into the distal vessel.  COMPLICATIONS:  None.  RESULTS: 1. Successful complex angioplasty utilizing the cutting balloon with additional  stent placement in the distal right coronary artery.  The mid right coronary    in-stent re-stenosis was reduced from 60% to residual 20%.  The distal 100%    occlusion of the stent was reduced to 0% residual.  A distal 95% stenosis was    treated with a stent reduced to 0% residual.  A distal 75% stenosis at the    bifurcation of the posterior descending artery was treated with angioplasty    resulting in 25% residual. 2. Successful percutaneous transluminal  coronary angioplasty of obtuse marginal  #3 reducing a 99% stenosis with TIMI-2 flow to less than 20% residual TIMI-3    flow.  PLAN:  Integrilin will be continued for 18 hours.  Plavix will be administered or four weeks.  The patient will then be reevaluated and cleared for her surgery preferably after she completes her course of Plavix.  In the future should the patient have in-stent re-stenosis, would consider the use of intracoronary brachi-therapy. DD:  02/26/00 TD:  02/26/00 Job: [id number]

## 2011-04-24 NOTE — H&P (Signed)
[facility]  Patient:    THIENKIM, SJODIN Visit Number: [id number] MRN: [id number]          Service Type: Attending:  Beckey Rutter, M.D. Dictated by:   Beckey Rutter, M.D. Adm. Date:  05/06/02                           History and Physical  CONTINUATION  10. History of multiple cerebrovascular accidents.  The patient had been on     Plavix in the past as the family recalls the medication.  It is not clear     if she is taking it now.  They will assess. 11. Left breast pain.  I did not mention the breast examination.  I do not     feel any dominant masses or suspicious lumps.  She certainly has some soft     mobile nodularities to the left breast.  Certainly a mammogram would be     reasonable.  Will proceed during the hospital stay. Dictated by:   Beckey Rutter, M.D. Attending:  Beckey Rutter, M.D. DD:  05/06/02 TD:  05/07/02 Job: [id number]

## 2011-05-07 ENCOUNTER — Other Ambulatory Visit: Payer: Self-pay | Admitting: Cardiology

## 2011-05-20 ENCOUNTER — Encounter (HOSPITAL_COMMUNITY): Payer: Self-pay | Admitting: Oncology

## 2011-05-27 ENCOUNTER — Encounter (HOSPITAL_COMMUNITY): Payer: Self-pay | Admitting: Oncology

## 2011-05-27 ENCOUNTER — Other Ambulatory Visit (HOSPITAL_COMMUNITY): Payer: Self-pay | Admitting: Oncology

## 2011-05-27 DIAGNOSIS — D509 Iron deficiency anemia, unspecified: Secondary | ICD-10-CM

## 2011-05-27 HISTORY — DX: Iron deficiency anemia, unspecified: D50.9

## 2011-06-18 ENCOUNTER — Encounter (HOSPITAL_COMMUNITY): Payer: Medicare Other | Attending: Oncology

## 2011-06-18 ENCOUNTER — Other Ambulatory Visit (HOSPITAL_COMMUNITY): Payer: Self-pay | Admitting: Oncology

## 2011-06-18 DIAGNOSIS — D509 Iron deficiency anemia, unspecified: Secondary | ICD-10-CM

## 2011-06-18 LAB — CBC
HCT: 36.5 % (ref 36.0–46.0)
MCH: 29.6 pg (ref 26.0–34.0)
MCV: 88.6 fL (ref 78.0–100.0)
Platelets: 144 10*3/uL — ABNORMAL LOW (ref 150–400)
RDW: 13.8 % (ref 11.5–15.5)

## 2011-06-18 LAB — IRON AND TIBC
Iron: 54 ug/dL (ref 42–135)
Saturation Ratios: 18 % — ABNORMAL LOW (ref 20–55)
TIBC: 293 ug/dL (ref 250–470)

## 2011-06-24 ENCOUNTER — Encounter (HOSPITAL_BASED_OUTPATIENT_CLINIC_OR_DEPARTMENT_OTHER): Payer: Medicare Other | Admitting: Oncology

## 2011-06-24 VITALS — BP 158/70 | HR 91 | Temp 98.5°F | Wt 200.0 lb

## 2011-06-24 DIAGNOSIS — D509 Iron deficiency anemia, unspecified: Secondary | ICD-10-CM

## 2011-06-24 DIAGNOSIS — D696 Thrombocytopenia, unspecified: Secondary | ICD-10-CM

## 2011-06-24 DIAGNOSIS — N289 Disorder of kidney and ureter, unspecified: Secondary | ICD-10-CM

## 2011-06-24 DIAGNOSIS — D638 Anemia in other chronic diseases classified elsewhere: Secondary | ICD-10-CM

## 2011-06-24 NOTE — Patient Instructions (Signed)
[facility]  Discharge Instructions  RECOMMENDATIONS MADE BY THE CONSULTANT AND ANY TEST RESULTS WILL BE SENT TO YOUR REFERRING DOCTOR.   EXAM FINDINGS BY MD TODAY AND SIGNS AND SYMPTOMS TO REPORT TO CLINIC OR PRIMARY MD:  Exam good   SPECIAL INSTRUCTIONS/FOLLOW-UP: Lab work Needed  6 months then see dr.   I acknowledge that I have been informed and understand all the instructions given to me and received a copy. I do not have any more questions at this time, but understand that I may call the Specialty Clinic at [facility] at [phone] during business hours should I have any further questions or need assistance in obtaining follow-up care.    __________________________________________  _____________  __________ Signature of Patient or Authorized Representative            Date                   Time    __________________________________________ Nurse's Signature

## 2011-06-24 NOTE — Progress Notes (Signed)
CC:   [name], NP Chipper Herb, M.D.  DIAGNOSIS: 1. Anemia of chronic disease with a normal hemoglobin. 2. Minimal thrombocytopenia, unclear as to etiology, will watch that. 3. Diabetes mellitus much better controlled. 4. Obesity still weighing in excess of what she should.  Her weight is     up 6 pounds to 200 pounds on a 5 foot 6 inch frame. 5. History of a stroke with mild residual deficits, primarily of     speech. 6. Heart disease with a decreased ejection fraction and no shortness     of breath presently. 7. Hypertension. 8. Mild renal insufficiency. 9. Cholecystectomy. 10.Open heart surgery in 2006. 11.Stent placement in the heart in 2004.  Zarielle's hemoglobin is 12.2 g, white count is normal, platelets are minimally low at 144,000, they were 167,000.  It is not clear that she is on any new drugs.  She is not have any bleeding problems.  So we will see her back in 6 months and check her labs then, but right now she is stable.  Ferritin is also stable at 37; it was 38 six months ago.  They know to call if there are any problems.    ______________________________ Gaston Islam. Tressie Stalker, MD ESN/MEDQ  D:  06/24/2011  T:  06/24/2011  Job:  [id number]

## 2011-06-24 NOTE — Progress Notes (Signed)
This office note has been dictated.

## 2011-07-06 ENCOUNTER — Encounter (HOSPITAL_COMMUNITY): Payer: Self-pay | Admitting: *Deleted

## 2011-07-06 ENCOUNTER — Emergency Department (HOSPITAL_COMMUNITY)
Admission: EM | Admit: 2011-07-06 | Discharge: 2011-07-07 | Disposition: A | Discharge status: Home / Self Care | Payer: Medicare Other | Attending: Emergency Medicine | Admitting: Emergency Medicine

## 2011-07-06 ENCOUNTER — Emergency Department (HOSPITAL_COMMUNITY): Payer: Medicare Other

## 2011-07-06 DIAGNOSIS — R0602 Shortness of breath: Secondary | ICD-10-CM | POA: Insufficient documentation

## 2011-07-06 DIAGNOSIS — R509 Fever, unspecified: Secondary | ICD-10-CM | POA: Insufficient documentation

## 2011-07-06 DIAGNOSIS — R059 Cough, unspecified: Secondary | ICD-10-CM | POA: Insufficient documentation

## 2011-07-06 DIAGNOSIS — R062 Wheezing: Secondary | ICD-10-CM | POA: Insufficient documentation

## 2011-07-06 DIAGNOSIS — R350 Frequency of micturition: Secondary | ICD-10-CM | POA: Insufficient documentation

## 2011-07-06 DIAGNOSIS — R05 Cough: Secondary | ICD-10-CM | POA: Insufficient documentation

## 2011-07-06 DIAGNOSIS — R109 Unspecified abdominal pain: Secondary | ICD-10-CM | POA: Insufficient documentation

## 2011-07-06 DIAGNOSIS — J449 Chronic obstructive pulmonary disease, unspecified: Secondary | ICD-10-CM

## 2011-07-06 DIAGNOSIS — R300 Dysuria: Secondary | ICD-10-CM | POA: Insufficient documentation

## 2011-07-06 DIAGNOSIS — J4489 Other specified chronic obstructive pulmonary disease: Secondary | ICD-10-CM | POA: Insufficient documentation

## 2011-07-06 HISTORY — DX: Heart failure, unspecified: I50.9

## 2011-07-06 MED ORDER — ALBUTEROL SULFATE (5 MG/ML) 0.5% IN NEBU: 5.0000 mg | INHALATION_SOLUTION | Freq: Four times a day (QID) | RESPIRATORY_TRACT | Status: DC | PRN

## 2011-07-06 NOTE — ED Notes (Signed)
Pt also being treated for UTI, pt IDDM and reports she does not check blood sugar daily

## 2011-07-06 NOTE — ED Notes (Signed)
Pt reports increasing cough x 2 days and today has had increasing sob

## 2011-07-06 NOTE — ED Provider Notes (Signed)
History   Chart scribed for Maudry Diego, MD by Caryl Bis; the patient was seen in room APA07/APA07; this patient's care was started at 10:22 PM.    Chief Complaint  Patient presents with  . Shortness of Breath   HPI [name] is a 77 y.o. female who presents to the Emergency Department complaining of cough and wheezing. Pt reports dry cough onset 2 days ago with increasing sob and wheezing since then. + h/o CHF. No LE swelling. No congestion or runny nose. +subjective fever and mild lower abd pain with dysuria, pt is currently on macrodantin for UTI. No cp, diaphoresis, chills, or dizziness. Pt h/o CVA x 3 and has slurred speech that family states is unchanged from baseline.  Past Medical History  Diagnosis Date  . Anemia   . Diabetes mellitus   . Hypertension   . Iron deficiency anemia 05/27/2011  . CHF (congestive heart failure)     Past Surgical History  Procedure Date  . Cholecystectomy   . Coronary angioplasty with stent placement 2004    No family history on file.  History  Substance Use Topics  . Smoking status: Never Smoker   . Smokeless tobacco: Not on file  . Alcohol Use: No    OB History    Grav Para Term Preterm Abortions TAB SAB Ect Mult Living                  Review of Systems  Constitutional: Positive for fever. Negative for diaphoresis and fatigue.  HENT: Negative for congestion, sinus pressure and ear discharge.   Eyes: Negative for discharge.  Respiratory: Positive for cough, shortness of breath and wheezing.   Cardiovascular: Negative for chest pain.  Gastrointestinal: Positive for abdominal pain. Negative for nausea, vomiting and diarrhea.  Genitourinary: Positive for dysuria and frequency. Negative for hematuria.  Musculoskeletal: Negative for back pain.  Skin: Negative for rash.  Neurological: Negative for seizures and headaches.  Hematological: Negative.   Psychiatric/Behavioral: Negative for hallucinations.    Physical  Exam  BP 150/60  Pulse 56  Temp(Src) 98.2 F (36.8 C) (Oral)  Resp 20  Ht 5\' 6"  (1.676 m)  Wt 200 lb (90.719 kg)  BMI 32.28 kg/m2  SpO2 95%  Physical Exam  Constitutional: She is oriented to person, place, and time. She appears well-developed.  HENT:  Head: Normocephalic and atraumatic.  Eyes: Conjunctivae and EOM are normal. No scleral icterus.  Neck: Neck supple. No thyromegaly present.  Cardiovascular: Normal rate and regular rhythm.  Exam reveals no gallop and no friction rub.   No murmur heard. Pulmonary/Chest: No stridor. She has no wheezes. She has no rales. She exhibits no tenderness.  Abdominal: She exhibits no distension. There is tenderness. There is no rebound.       Mild suprapubic tenderness  Musculoskeletal: Normal range of motion. She exhibits no edema.  Lymphadenopathy:    She has no cervical adenopathy.  Neurological: She is oriented to person, place, and time. Coordination normal.       Slurred speech which is at baseline according to family  Skin: No rash noted. No erythema.  Psychiatric: She has a normal mood and affect. Her behavior is normal.    ED Course  Procedures  MDM Results for orders placed in visit on 06/18/11  FERRITIN      Component Value Range   Ferritin 37  10 - 291 (ng/mL)   Results for orders placed in visit on 06/18/11  FERRITIN  Component Value Range   Ferritin 37  10 - 291 (ng/mL)   X-ray copd.  I personally performed the services described in this documentation, which was scribed in my presence. The recorded information has been reviewed and considered. Maudry Diego, MD        Maudry Diego, MD 07/07/11 [phone]

## 2011-07-07 LAB — GLUCOSE, CAPILLARY: Glucose-Capillary: 154 mg/dL — ABNORMAL HIGH (ref 70–99)

## 2011-07-07 MED ORDER — ALBUTEROL SULFATE HFA 108 (90 BASE) MCG/ACT IN AERS: 1.0000 | INHALATION_SPRAY | Freq: Four times a day (QID) | RESPIRATORY_TRACT | Status: DC | PRN

## 2011-08-14 ENCOUNTER — Emergency Department (HOSPITAL_COMMUNITY)
Admission: EM | Admit: 2011-08-14 | Discharge: 2011-08-14 | Disposition: A | Discharge status: Home / Self Care | Payer: Medicare Other | Attending: Emergency Medicine | Admitting: Emergency Medicine

## 2011-08-14 ENCOUNTER — Emergency Department (HOSPITAL_COMMUNITY): Payer: Medicare Other

## 2011-08-14 ENCOUNTER — Encounter (HOSPITAL_COMMUNITY): Payer: Self-pay | Admitting: *Deleted

## 2011-08-14 DIAGNOSIS — I509 Heart failure, unspecified: Secondary | ICD-10-CM | POA: Insufficient documentation

## 2011-08-14 DIAGNOSIS — IMO0002 Reserved for concepts with insufficient information to code with codable children: Secondary | ICD-10-CM | POA: Insufficient documentation

## 2011-08-14 DIAGNOSIS — Z794 Long term (current) use of insulin: Secondary | ICD-10-CM | POA: Insufficient documentation

## 2011-08-14 DIAGNOSIS — S0003XA Contusion of scalp, initial encounter: Secondary | ICD-10-CM | POA: Insufficient documentation

## 2011-08-14 DIAGNOSIS — Z9861 Coronary angioplasty status: Secondary | ICD-10-CM | POA: Insufficient documentation

## 2011-08-14 DIAGNOSIS — I10 Essential (primary) hypertension: Secondary | ICD-10-CM | POA: Insufficient documentation

## 2011-08-14 DIAGNOSIS — E119 Type 2 diabetes mellitus without complications: Secondary | ICD-10-CM | POA: Insufficient documentation

## 2011-08-14 DIAGNOSIS — Z7982 Long term (current) use of aspirin: Secondary | ICD-10-CM | POA: Insufficient documentation

## 2011-08-14 DIAGNOSIS — Z79899 Other long term (current) drug therapy: Secondary | ICD-10-CM | POA: Insufficient documentation

## 2011-08-14 DIAGNOSIS — W1809XA Striking against other object with subsequent fall, initial encounter: Secondary | ICD-10-CM | POA: Insufficient documentation

## 2011-08-14 DIAGNOSIS — S0083XA Contusion of other part of head, initial encounter: Secondary | ICD-10-CM | POA: Insufficient documentation

## 2011-08-14 NOTE — ED Notes (Signed)
Pt walked to nurses station & back. Pt states still a little weak from low blood sugar. Pt given another drink & sandwich at this time.

## 2011-08-14 NOTE — ED Provider Notes (Signed)
History     CSN: [id number] Arrival date & time: 08/14/2011  7:05 PM Pt seen at [facility]  Patient presents with  . Shoulder Injury   HPI Comments: Pt fell earlier this evening while tripping on "roots in the yard" She hit her face, but no LOC but she takes plavix Reports neck pain and right shoulder pain No visual change No new weakness No cp/sob   Patient is a 77 y.o. female presenting with shoulder injury. The history is provided by the patient and a relative.  Shoulder Injury The current episode started 1 to 2 hours ago. Pertinent negatives include no chest pain, no headaches and no shortness of breath. Exacerbated by: movement/palpation. The symptoms are relieved by rest.    Past Medical History  Diagnosis Date  . Anemia   . Diabetes mellitus   . Hypertension   . Iron deficiency anemia 05/27/2011  . CHF (congestive heart failure)     Past Surgical History  Procedure Date  . Cholecystectomy   . Coronary angioplasty with stent placement 2004    History reviewed. No pertinent family history.  History  Substance Use Topics  . Smoking status: Never Smoker   . Smokeless tobacco: Not on file  . Alcohol Use: No    OB History    Grav Para Term Preterm Abortions TAB SAB Ect Mult Living                  Review of Systems  Respiratory: Negative for shortness of breath.   Cardiovascular: Negative for chest pain.  Neurological: Negative for headaches.  All other systems reviewed and are negative.    Physical Exam  BP 146/67  Pulse 52  Temp(Src) 98.6 F (37 C) (Oral)  Resp 20  Ht 5\' 6"  (1.676 m)  Wt 200 lb (90.719 kg)  BMI 32.28 kg/m2  SpO2 93%  Physical Exam  CONSTITUTIONAL: Well developed/well nourished HEAD AND FACE: Normocephalic/atraumatic EYES: EOMI/PERRL ENMT: Mucous membranes moist, no septal hematoma, small abrasion below nose, no nasal deformity, she has no mandibular teeth in place (baseline).  No malocclusion NECK: supple no  meningeal signs SPINE:C-spine tender, no TL tenderness, No bruising/crepitance/stepoffs noted to spine CV: S1/S2 noted, no murmurs/rubs/gallops noted LUNGS: Lungs are clear to auscultation bilaterally, no apparent distress Chest - nontender to palpation ABDOMEN: soft, nontender, no rebound or guarding NEURO: Pt is awake/alert, moves all extremitiesx4, distal neurovascular intact on right UE EXTREMITIES: tenderness to right anterior shoulder and with ROM of right shoulder.  No shoulder deformity She has full ROM of right elbow/wrist without tenderness All other extremities/joints palpated/ranged and nontender Pulses intact on right UE SKIN: warm, color normal PSYCH: no abnormalities of mood noted   ED Course  Procedures  MDM Nursing notes reviewed and considered in documentation xrays reviewed and considered   c-collar ordered prior to imaging  9:37 PM Pt improved Discussed imaging results Ambulatory, felt "jittery" but likely due to drop in glucose No other complaints She is able to abduct/adduct right shoulder, doubt occult dislocation Advised to use sling sparingly and has f/u with PCP next week       Sharyon Cable, MD 08/14/11 2139

## 2011-08-14 NOTE — ED Notes (Signed)
Pt given drink & crackers at this time.

## 2011-08-14 NOTE — ED Notes (Signed)
Pt states she fell on some rocks, complaining of pain to the right arm & shoulder. Abrasion noted under nose.

## 2011-08-25 ENCOUNTER — Ambulatory Visit: Payer: Medicare Other | Attending: Family Medicine | Admitting: Physical Therapy

## 2011-08-25 DIAGNOSIS — R5381 Other malaise: Secondary | ICD-10-CM | POA: Insufficient documentation

## 2011-08-25 DIAGNOSIS — M25519 Pain in unspecified shoulder: Secondary | ICD-10-CM | POA: Insufficient documentation

## 2011-08-25 DIAGNOSIS — M25619 Stiffness of unspecified shoulder, not elsewhere classified: Secondary | ICD-10-CM | POA: Insufficient documentation

## 2011-08-25 DIAGNOSIS — IMO0001 Reserved for inherently not codable concepts without codable children: Secondary | ICD-10-CM | POA: Insufficient documentation

## 2011-08-27 ENCOUNTER — Ambulatory Visit: Payer: Medicare Other | Admitting: Physical Therapy

## 2011-09-01 ENCOUNTER — Ambulatory Visit: Payer: Medicare Other | Admitting: Physical Therapy

## 2011-09-02 LAB — DIFFERENTIAL
Basophils Absolute: 0
Basophils Relative: 0
Eosinophils Absolute: 0.1
Eosinophils Relative: 0
Eosinophils Relative: 1
Eosinophils Relative: 1
Eosinophils Relative: 2
Lymphocytes Relative: 18
Lymphocytes Relative: 3 — ABNORMAL LOW
Lymphocytes Relative: 22
Lymphs Abs: 0.2 — ABNORMAL LOW
Lymphs Abs: 1.1
Monocytes Absolute: 0.1
Monocytes Absolute: 0.1
Monocytes Absolute: 0.1
Monocytes Relative: 1 — ABNORMAL LOW
Monocytes Relative: 1 — ABNORMAL LOW
Monocytes Relative: 3
Neutro Abs: 7.8 — ABNORMAL HIGH
Neutrophils Relative %: 80 — ABNORMAL HIGH

## 2011-09-02 LAB — CBC
HCT: 30.2 — ABNORMAL LOW
HCT: 31.5 — ABNORMAL LOW
HCT: 31.1 — ABNORMAL LOW
Hemoglobin: 10.5 — ABNORMAL LOW
Hemoglobin: 10.8 — ABNORMAL LOW
Hemoglobin: 10.7 — ABNORMAL LOW
MCHC: 34.8
MCV: 84.6
Platelets: 127 — ABNORMAL LOW
Platelets: 130 — ABNORMAL LOW
RBC: 3.57 — ABNORMAL LOW
RBC: 3.64 — ABNORMAL LOW
RBC: 3.73 — ABNORMAL LOW
RDW: 15.1
RDW: 14.5
WBC: 6.2

## 2011-09-02 LAB — URINE CULTURE: Colony Count: >=100000

## 2011-09-02 LAB — OVA AND PARASITE EXAMINATION: Ova and parasites: NONE SEEN

## 2011-09-02 LAB — CULTURE, BLOOD (ROUTINE X 2)
Culture: NO GROWTH
Report Status: 5182009

## 2011-09-02 LAB — BASIC METABOLIC PANEL
BUN: 13
CO2: 27
CO2: 30
Chloride: 103
Chloride: 105
Chloride: 103
GFR calc Af Amer: >60
GFR calc Af Amer: >60
GFR calc non Af Amer: >60
GFR calc non Af Amer: >60
Glucose, Bld: 117 — ABNORMAL HIGH
Glucose, Bld: 121 — ABNORMAL HIGH
Potassium: 4.1
Potassium: 4
Sodium: 138
Sodium: 140

## 2011-09-02 LAB — URINALYSIS, ROUTINE W REFLEX MICROSCOPIC
Bilirubin Urine: NEGATIVE
Glucose, UA: >1000 — AB
Hgb urine dipstick: NEGATIVE
Nitrite: NEGATIVE
Specific Gravity, Urine: 1.025
pH: 5

## 2011-09-02 LAB — HEMOGLOBIN A1C
Hgb A1c MFr Bld: 9.9 — ABNORMAL HIGH
Mean Plasma Glucose: 275

## 2011-09-02 LAB — COMPREHENSIVE METABOLIC PANEL
AST: 23
Albumin: 3 — ABNORMAL LOW
BUN: 25 — ABNORMAL HIGH
Creatinine, Ser: 1
GFR calc Af Amer: >60
Total Protein: 5.8 — ABNORMAL LOW

## 2011-09-02 LAB — URINE MICROSCOPIC-ADD ON

## 2011-09-02 LAB — AMYLASE: Amylase: 57

## 2011-09-02 LAB — TSH: TSH: 1.07

## 2011-09-03 ENCOUNTER — Ambulatory Visit: Payer: Medicare Other | Admitting: Physical Therapy

## 2011-09-08 ENCOUNTER — Ambulatory Visit: Payer: Medicare Other | Attending: Family Medicine | Admitting: Physical Therapy

## 2011-09-08 DIAGNOSIS — M25619 Stiffness of unspecified shoulder, not elsewhere classified: Secondary | ICD-10-CM | POA: Insufficient documentation

## 2011-09-08 DIAGNOSIS — M25519 Pain in unspecified shoulder: Secondary | ICD-10-CM | POA: Insufficient documentation

## 2011-09-08 DIAGNOSIS — R5381 Other malaise: Secondary | ICD-10-CM | POA: Insufficient documentation

## 2011-09-08 DIAGNOSIS — IMO0001 Reserved for inherently not codable concepts without codable children: Secondary | ICD-10-CM | POA: Insufficient documentation

## 2011-09-10 ENCOUNTER — Ambulatory Visit: Payer: Medicare Other | Admitting: Physical Therapy

## 2011-09-15 ENCOUNTER — Ambulatory Visit: Payer: Medicare Other | Admitting: Physical Therapy

## 2011-09-17 ENCOUNTER — Ambulatory Visit: Payer: Medicare Other | Admitting: Physical Therapy

## 2011-09-22 ENCOUNTER — Ambulatory Visit: Payer: Medicare Other | Admitting: Physical Therapy

## 2011-09-24 ENCOUNTER — Ambulatory Visit: Payer: Medicare Other | Admitting: Physical Therapy

## 2011-09-29 ENCOUNTER — Ambulatory Visit: Payer: Medicare Other | Admitting: Physical Therapy

## 2011-10-01 ENCOUNTER — Ambulatory Visit: Payer: Medicare Other | Admitting: Physical Therapy

## 2011-10-06 ENCOUNTER — Ambulatory Visit: Payer: Medicare Other | Admitting: Physical Therapy

## 2011-10-08 ENCOUNTER — Encounter (HOSPITAL_COMMUNITY): Payer: Self-pay | Admitting: *Deleted

## 2011-10-08 ENCOUNTER — Ambulatory Visit: Payer: Medicare Other | Attending: Family Medicine | Admitting: Physical Therapy

## 2011-10-08 ENCOUNTER — Emergency Department (HOSPITAL_COMMUNITY)
Admission: EM | Admit: 2011-10-08 | Discharge: 2011-10-08 | Disposition: A | Discharge status: Home / Self Care | Payer: Medicare Other | Attending: Emergency Medicine | Admitting: Emergency Medicine

## 2011-10-08 DIAGNOSIS — Z79899 Other long term (current) drug therapy: Secondary | ICD-10-CM | POA: Insufficient documentation

## 2011-10-08 DIAGNOSIS — Z9861 Coronary angioplasty status: Secondary | ICD-10-CM | POA: Insufficient documentation

## 2011-10-08 DIAGNOSIS — Z794 Long term (current) use of insulin: Secondary | ICD-10-CM | POA: Insufficient documentation

## 2011-10-08 DIAGNOSIS — I509 Heart failure, unspecified: Secondary | ICD-10-CM | POA: Insufficient documentation

## 2011-10-08 DIAGNOSIS — I10 Essential (primary) hypertension: Secondary | ICD-10-CM | POA: Insufficient documentation

## 2011-10-08 DIAGNOSIS — M25519 Pain in unspecified shoulder: Secondary | ICD-10-CM | POA: Insufficient documentation

## 2011-10-08 DIAGNOSIS — R5381 Other malaise: Secondary | ICD-10-CM | POA: Insufficient documentation

## 2011-10-08 DIAGNOSIS — Z9889 Other specified postprocedural states: Secondary | ICD-10-CM | POA: Insufficient documentation

## 2011-10-08 DIAGNOSIS — H8309 Labyrinthitis, unspecified ear: Secondary | ICD-10-CM | POA: Insufficient documentation

## 2011-10-08 DIAGNOSIS — M25619 Stiffness of unspecified shoulder, not elsewhere classified: Secondary | ICD-10-CM | POA: Insufficient documentation

## 2011-10-08 DIAGNOSIS — H6691 Otitis media, unspecified, right ear: Secondary | ICD-10-CM

## 2011-10-08 DIAGNOSIS — IMO0001 Reserved for inherently not codable concepts without codable children: Secondary | ICD-10-CM | POA: Insufficient documentation

## 2011-10-08 DIAGNOSIS — E119 Type 2 diabetes mellitus without complications: Secondary | ICD-10-CM | POA: Insufficient documentation

## 2011-10-08 DIAGNOSIS — Z7982 Long term (current) use of aspirin: Secondary | ICD-10-CM | POA: Insufficient documentation

## 2011-10-08 DIAGNOSIS — H6690 Otitis media, unspecified, unspecified ear: Secondary | ICD-10-CM | POA: Insufficient documentation

## 2011-10-08 MED ORDER — MECLIZINE HCL 12.5 MG PO TABS
25.0000 mg | ORAL_TABLET | Freq: Once | ORAL | Status: AC
  Administered 2011-10-08: 25 mg via ORAL
  Filled 2011-10-08: qty 2

## 2011-10-08 MED ORDER — AMOXICILLIN-POT CLAVULANATE 875-125 MG PO TABS
1.0000 | ORAL_TABLET | Freq: Once | ORAL | Status: AC
  Administered 2011-10-08: 1 via ORAL
  Filled 2011-10-08: qty 1

## 2011-10-08 MED ORDER — AMOXICILLIN-POT CLAVULANATE 875-125 MG PO TABS: 1.0000 | ORAL_TABLET | Freq: Two times a day (BID) | ORAL | Status: AC

## 2011-10-08 MED ORDER — LORAZEPAM 1 MG PO TABS: 0.5000 mg | ORAL_TABLET | Freq: Three times a day (TID) | ORAL | Status: AC | PRN

## 2011-10-08 MED ORDER — LORAZEPAM 1 MG PO TABS
0.5000 mg | ORAL_TABLET | Freq: Once | ORAL | Status: AC
  Administered 2011-10-08: 0.5 mg via ORAL
  Filled 2011-10-08: qty 1

## 2011-10-08 NOTE — ED Provider Notes (Signed)
History   This chart was scribed for Nat Christen, MD by Carolyne Littles. The patient was seen in room APA06/[facility]06 and the patient's care was started at 2:12PM.   CSN: [id number] Arrival date & time: 10/08/2011  1:31 PM   First MD Initiated Contact with Patient 10/08/11 1400      Chief Complaint  Patient presents with  . Otalgia    right ear  . Dizziness    (Consider location/radiation/quality/duration/timing/severity/associated sxs/prior treatment) HPI SYBEL KOSTIUK is a 77 y.o. female who presents to the Emergency Department complaining of constant moderate non-radiating right ear pain onset several days ago and persistent since with associated dizziness. Denies HA, numbness, tingling, blurred vision, fever. Reports symptoms are aggravated and relieved by nothing. Patient with a h/o anemia, diabetes, hypertension, CHF.   Past Medical History  Diagnosis Date  . Anemia   . Diabetes mellitus   . Hypertension   . Iron deficiency anemia 05/27/2011  . CHF (congestive heart failure)     Past Surgical History  Procedure Date  . Cholecystectomy   . Coronary angioplasty with stent placement 2004    No family history on file.  History  Substance Use Topics  . Smoking status: Never Smoker   . Smokeless tobacco: Not on file  . Alcohol Use: No    OB History    Grav Para Term Preterm Abortions TAB SAB Ect Mult Living                  Review of Systems 10 Systems reviewed and are negative for acute change except as noted in the HPI.  Allergies  Propoxyphene n-acetaminophen  Home Medications   Current Outpatient Rx  Name Route Sig Dispense Refill  . ALBUTEROL SULFATE HFA 108 (90 BASE) MCG/ACT IN AERS Inhalation Inhale 1-2 puffs into the lungs every 6 (six) hours as needed for wheezing. 1 Inhaler 0  . AMLODIPINE BESYLATE 5 MG PO TABS Oral Take 7.5 mg by mouth daily.     . DRY EYES OP Ophthalmic Apply 1 drop to eye daily as needed. For dry eye/ red eye     . ASPIRIN 81 MG  PO TBEC Oral Take 81 mg by mouth at bedtime.     . ATORVASTATIN CALCIUM 20 MG PO TABS Oral Take 20 mg by mouth at bedtime.     Marland Kitchen BUTALBITAL-APAP-CAFFEINE 50-325-40 MG PO TABS Oral Take 1 tablet by mouth 2 (two) times daily as needed. Pain      . CELECOXIB 200 MG PO CAPS Oral Take 200 mg by mouth daily.      Marland Kitchen CLOPIDOGREL BISULFATE 75 MG PO TABS Oral Take 75 mg by mouth daily.      Marland Kitchen ESOMEPRAZOLE MAGNESIUM 40 MG PO CPDR Oral Take 40 mg by mouth daily as needed. For acid reflux    . EZETIMIBE-SIMVASTATIN 10-40 MG PO TABS Oral Take 1 tablet by mouth at bedtime.      . FUROSEMIDE 20 MG PO TABS Oral Take 20 mg by mouth daily as needed. For fluid    . HYDROCHLOROTHIAZIDE 25 MG PO TABS Oral Take 25 mg by mouth daily.      Marland Kitchen HYDROCODONE-ACETAMINOPHEN 5-500 MG PO TABS Oral Take 1 tablet by mouth every 6 (six) hours as needed. For pain      . INSULIN ISOPHANE HUMAN 100 UNIT/ML Rienzi SUSP Subcutaneous Inject 95 Units into the skin daily after breakfast. Check blood sugar levels every day at bedtime. If levels are 200= 5  units, 250= 10 units, if 300= 20 units, If 350 or more= ER    . LISINOPRIL 20 MG PO TABS Oral Take 20 mg by mouth 2 (two) times daily.      Marland Kitchen LOPRESSOR 50 MG PO TABS  TAKE 1 TABLET TWICE DAILY 60 each 5  . MECLIZINE HCL 25 MG PO TABS Oral Take 25 mg by mouth 3 (three) times daily as needed. dizziness     . MULTIVITAMINS PO TABS Oral Take 1 tablet by mouth at bedtime.     Marland Kitchen PAROXETINE HCL 10 MG PO TABS Oral Take 10 mg by mouth every morning.      Marland Kitchen DICLOFENAC SODIUM 25 MG PO TBEC Oral Take 25 mg by mouth as needed. pain    . NITROGLYCERIN 0.4 MG SL SUBL Sublingual Place 0.4 mg under the tongue every 5 (five) minutes as needed.        BP 181/67  Pulse 64  Temp(Src) 98.4 F (36.9 C) (Oral)  Resp 22  Ht 5\' 6"  (1.676 m)  Wt 200 lb (90.719 kg)  BMI 32.28 kg/m2  SpO2 98%  Physical Exam  Nursing note and vitals reviewed. Constitutional: She is oriented to person, place, and time. She  appears well-developed and well-nourished. No distress.  HENT:  Head: Normocephalic and atraumatic.  Right Ear: Tympanic membrane, external ear and ear canal normal.  Left Ear: Tympanic membrane, external ear and ear canal normal.  Eyes: EOM are normal. Pupils are equal, round, and reactive to light.  Neck: Neck supple. No tracheal deviation present.  Cardiovascular: Normal rate.   Pulmonary/Chest: Effort normal. No respiratory distress.  Abdominal: She exhibits no distension.  Musculoskeletal: Normal range of motion. She exhibits no edema.       Limited ROM of LUE c/w patient reported injury to LUE.   Neurological: She is alert and oriented to person, place, and time. No sensory deficit.       Strength normal in bilateral lower extremities.   Skin: Skin is warm and dry.  Psychiatric: She has a normal mood and affect. Her behavior is normal.    ED Course  Procedures (including critical care time)  DIAGNOSTIC STUDIES: Oxygen Saturation is 96% on room air, normal by my interpretation.    COORDINATION OF CARE:    Labs Reviewed - No data to display No results found.   No diagnosis found.    MDM   History and physical most consistent with vertigo. Discussed CT scan with patient and daughter. They elected to defer scan. Will return if worse.  Patient is alert and oriented. Moving all extremities.   CT-Head without Contrast. Abx for possible otitis media. Antivert to treat dizziness.       Nat Christen, MD 10/08/11 1434

## 2011-10-08 NOTE — ED Notes (Signed)
C/o right earache x 1 week; c/o feeling dizzy, "like the room is spinning", onset last night

## 2011-10-20 ENCOUNTER — Ambulatory Visit: Payer: Medicare Other | Admitting: Physical Therapy

## 2011-10-22 ENCOUNTER — Ambulatory Visit: Payer: Medicare Other | Admitting: Physical Therapy

## 2011-10-27 ENCOUNTER — Ambulatory Visit: Payer: Medicare Other | Admitting: Physical Therapy

## 2011-11-03 ENCOUNTER — Ambulatory Visit: Payer: Medicare Other | Admitting: Physical Therapy

## 2011-11-06 ENCOUNTER — Ambulatory Visit: Payer: Medicare Other | Admitting: Physical Therapy

## 2011-11-10 ENCOUNTER — Ambulatory Visit: Payer: Medicare Other | Attending: Family Medicine | Admitting: Physical Therapy

## 2011-11-10 DIAGNOSIS — IMO0001 Reserved for inherently not codable concepts without codable children: Secondary | ICD-10-CM | POA: Insufficient documentation

## 2011-11-10 DIAGNOSIS — M25519 Pain in unspecified shoulder: Secondary | ICD-10-CM | POA: Insufficient documentation

## 2011-11-10 DIAGNOSIS — M25619 Stiffness of unspecified shoulder, not elsewhere classified: Secondary | ICD-10-CM | POA: Insufficient documentation

## 2011-11-10 DIAGNOSIS — R5381 Other malaise: Secondary | ICD-10-CM | POA: Insufficient documentation

## 2011-11-12 ENCOUNTER — Ambulatory Visit: Payer: Medicare Other | Admitting: Physical Therapy

## 2011-11-17 ENCOUNTER — Ambulatory Visit: Payer: Medicare Other | Admitting: Physical Therapy

## 2011-11-19 ENCOUNTER — Ambulatory Visit: Payer: Medicare Other | Admitting: Physical Therapy

## 2011-11-24 ENCOUNTER — Ambulatory Visit: Payer: Medicare Other | Admitting: Physical Therapy

## 2011-11-26 ENCOUNTER — Ambulatory Visit: Payer: Medicare Other | Admitting: *Deleted

## 2011-11-27 ENCOUNTER — Other Ambulatory Visit: Payer: Self-pay

## 2011-11-27 ENCOUNTER — Other Ambulatory Visit: Payer: Self-pay | Admitting: Cardiology

## 2011-11-27 MED ORDER — METOPROLOL TARTRATE 50 MG PO TABS: 50.0000 mg | ORAL_TABLET | Freq: Two times a day (BID) | ORAL | Status: DC

## 2011-12-03 ENCOUNTER — Ambulatory Visit: Payer: Medicare Other | Admitting: Physical Therapy

## 2011-12-07 ENCOUNTER — Other Ambulatory Visit: Payer: Self-pay | Admitting: *Deleted

## 2011-12-07 ENCOUNTER — Encounter (INDEPENDENT_AMBULATORY_CARE_PROVIDER_SITE_OTHER): Payer: Medicare Other | Admitting: *Deleted

## 2011-12-07 DIAGNOSIS — I6529 Occlusion and stenosis of unspecified carotid artery: Secondary | ICD-10-CM

## 2011-12-09 ENCOUNTER — Ambulatory Visit: Payer: Medicare Other | Attending: Family Medicine | Admitting: Physical Therapy

## 2011-12-09 DIAGNOSIS — M25619 Stiffness of unspecified shoulder, not elsewhere classified: Secondary | ICD-10-CM | POA: Insufficient documentation

## 2011-12-09 DIAGNOSIS — IMO0001 Reserved for inherently not codable concepts without codable children: Secondary | ICD-10-CM | POA: Insufficient documentation

## 2011-12-09 DIAGNOSIS — M25519 Pain in unspecified shoulder: Secondary | ICD-10-CM | POA: Insufficient documentation

## 2011-12-09 DIAGNOSIS — R5381 Other malaise: Secondary | ICD-10-CM | POA: Insufficient documentation

## 2011-12-15 ENCOUNTER — Ambulatory Visit: Payer: Medicare Other | Admitting: Physical Therapy

## 2011-12-17 ENCOUNTER — Ambulatory Visit: Payer: Medicare Other | Admitting: Physical Therapy

## 2011-12-17 ENCOUNTER — Telehealth: Payer: Self-pay | Admitting: Cardiology

## 2011-12-17 NOTE — Telephone Encounter (Signed)
New Problem:    Patient called in returning a call about the results of her mother's carotid test. Please call back.

## 2011-12-17 NOTE — Telephone Encounter (Signed)
Reviewed results.  Aware to repeat in 1  year

## 2011-12-17 NOTE — Patient Instructions (Signed)
Results reviewed with pt

## 2011-12-22 ENCOUNTER — Ambulatory Visit: Payer: Medicare Other | Admitting: Physical Therapy

## 2011-12-23 ENCOUNTER — Encounter (HOSPITAL_COMMUNITY): Payer: Medicare Other | Attending: Oncology

## 2011-12-23 DIAGNOSIS — D509 Iron deficiency anemia, unspecified: Secondary | ICD-10-CM | POA: Insufficient documentation

## 2011-12-23 LAB — CBC
HCT: 35.4 % — ABNORMAL LOW (ref 36.0–46.0)
Hemoglobin: 11.4 g/dL — ABNORMAL LOW (ref 12.0–15.0)
MCH: 28.5 pg (ref 26.0–34.0)
MCHC: 32.2 g/dL (ref 30.0–36.0)
MCV: 88.5 fL (ref 78.0–100.0)
RDW: 14.1 % (ref 11.5–15.5)
WBC: 5.1 10*3/uL (ref 4.0–10.5)

## 2011-12-23 NOTE — Progress Notes (Signed)
Labs drawn today for cbc,ferr 

## 2011-12-24 ENCOUNTER — Ambulatory Visit: Payer: Medicare Other | Admitting: Physical Therapy

## 2011-12-25 ENCOUNTER — Other Ambulatory Visit (HOSPITAL_COMMUNITY): Payer: Medicare Other

## 2011-12-25 ENCOUNTER — Encounter (HOSPITAL_BASED_OUTPATIENT_CLINIC_OR_DEPARTMENT_OTHER): Payer: Medicare Other | Admitting: Oncology

## 2011-12-25 DIAGNOSIS — E119 Type 2 diabetes mellitus without complications: Secondary | ICD-10-CM

## 2011-12-25 DIAGNOSIS — D638 Anemia in other chronic diseases classified elsewhere: Secondary | ICD-10-CM | POA: Insufficient documentation

## 2011-12-25 DIAGNOSIS — I519 Heart disease, unspecified: Secondary | ICD-10-CM

## 2011-12-25 DIAGNOSIS — N289 Disorder of kidney and ureter, unspecified: Secondary | ICD-10-CM

## 2011-12-25 NOTE — Progress Notes (Signed)
This office note has been dictated.

## 2011-12-25 NOTE — Progress Notes (Signed)
CC:   Chipper Herb, M.D.  DIAGNOSES: 1. Anemia of chronic disease much improved since controlling her     diabetes. 2. Diabetes mellitus. 3. Obesity, still weighing 196 pounds on a 5 foot 6 inch frame. 4. History of a stroke with residual deficits primarily of the speech. 5. Hypertension. 6. Heart disease with a decreased ejection fraction but denies     shortness of breath presently. 7. Mild renal insufficiency. 8. Cholecystectomy in the past. 9. Open heart surgery in 2006. 10.Stent to her heart in 2004.  [name] is still overweight of course for height but she, I do not think will change that much.  Her blood counts are essentially very, very stable.  Her platelets are rarely low at 143,000 two days ago, hemoglobin varies from 11.3 to 12 and is back down to 11.4.  White count is stable.  Ferritin was 40 the other day.  She did fall and hurt her right shoulder.  She had an x-ray in September.  She also had carotid Dopplers December 31 which showed mild to moderate stenosis only, not in need of therapeutic intervention.  [name]'s platelets were 144,000 back in July, 167,000 in December of 2011 so she does fluctuate mildly.  Her ferritin is very stable.  There is no evidence for iron deficiency as of July 12 labs either.  So we will see her back in 6 more months just with a CBC and ferritin and make sure we keep track on her iron studies but she is asymptomatic, needs to control her weight and sugar a little bit better than she is doing probably but I am not sure will change her eating habits.  I think she has to want to be better.  So will see her in the future.    ______________________________ Gaston Islam. Tressie Stalker, MD ESN/MEDQ  D:  12/25/2011  T:  12/25/2011  Job:  [id number]

## 2011-12-25 NOTE — Patient Instructions (Signed)
[name]  [id number] Dec 25, 1930   [facility]  Discharge Instructions  RECOMMENDATIONS MADE BY THE CONSULTANT AND ANY TEST RESULTS WILL BE SENT TO YOUR REFERRING DOCTOR.   EXAM FINDINGS BY MD TODAY AND SIGNS AND SYMPTOMS TO REPORT TO CLINIC OR PRIMARY MD: You are doing well, will not make any changes.  MEDICATIONS PRESCRIBED: none   INSTRUCTIONS GIVEN AND DISCUSSED: Other :  Report shortness of breath and increased fatique.  SPECIAL INSTRUCTIONS/FOLLOW-UP: Lab work Needed in 6 months and Return to Clinic to see PA a few days later.   I acknowledge that I have been informed and understand all the instructions given to me and received a copy. I do not have any more questions at this time, but understand that I may call the Specialty Clinic at [facility] at [phone] during business hours should I have any further questions or need assistance in obtaining follow-up care.    __________________________________________  _____________  __________ Signature of Patient or Authorized Representative            Date                   Time    __________________________________________ Nurse's Signature

## 2011-12-29 ENCOUNTER — Ambulatory Visit: Payer: Medicare Other | Admitting: Physical Therapy

## 2011-12-31 ENCOUNTER — Ambulatory Visit: Payer: Medicare Other | Admitting: Physical Therapy

## 2012-01-05 ENCOUNTER — Ambulatory Visit: Payer: Medicare Other | Admitting: Physical Therapy

## 2012-01-07 ENCOUNTER — Ambulatory Visit: Payer: Medicare Other | Admitting: Physical Therapy

## 2012-03-01 ENCOUNTER — Other Ambulatory Visit: Payer: Self-pay | Admitting: Cardiology

## 2012-03-04 ENCOUNTER — Other Ambulatory Visit: Payer: Self-pay | Admitting: Cardiology

## 2012-03-07 HISTORY — PX: OTHER SURGICAL HISTORY: SHX169

## 2012-03-07 NOTE — Telephone Encounter (Signed)
..   Requested Prescriptions   Pending Prescriptions Disp Refills  . LOPRESSOR 50 MG tablet [Pharmacy Med Name: LOPRESSOR 50MG  TABLET] 60 each 1    Sig: TAKE (1) TABLET TWICE DAILY.  Patient needs to call office to make an appointment

## 2012-05-04 ENCOUNTER — Encounter: Payer: Self-pay | Admitting: Cardiology

## 2012-05-04 ENCOUNTER — Ambulatory Visit (INDEPENDENT_AMBULATORY_CARE_PROVIDER_SITE_OTHER): Payer: Medicare Other | Admitting: Cardiology

## 2012-05-04 VITALS — BP 140/60 | HR 58 | Ht 66.0 in | Wt 195.0 lb

## 2012-05-04 DIAGNOSIS — I4891 Unspecified atrial fibrillation: Secondary | ICD-10-CM

## 2012-05-04 DIAGNOSIS — E785 Hyperlipidemia, unspecified: Secondary | ICD-10-CM

## 2012-05-04 DIAGNOSIS — I5031 Acute diastolic (congestive) heart failure: Secondary | ICD-10-CM

## 2012-05-04 DIAGNOSIS — E119 Type 2 diabetes mellitus without complications: Secondary | ICD-10-CM

## 2012-05-04 DIAGNOSIS — I2510 Atherosclerotic heart disease of native coronary artery without angina pectoris: Secondary | ICD-10-CM

## 2012-05-04 DIAGNOSIS — I6529 Occlusion and stenosis of unspecified carotid artery: Secondary | ICD-10-CM

## 2012-05-04 DIAGNOSIS — I10 Essential (primary) hypertension: Secondary | ICD-10-CM

## 2012-05-04 NOTE — Assessment & Plan Note (Signed)
Review these records. She has 40-59% bilateral stenosis and will have follow Doppler in December.

## 2012-05-04 NOTE — Assessment & Plan Note (Signed)
Her last LDL in March was 40 with an HDL of 41. She will continue medications as listed.

## 2012-05-04 NOTE — Assessment & Plan Note (Signed)
The patient has no new sypmtoms.  No further cardiovascular testing is indicated.  We will continue with aggressive risk reduction and meds as listed.  

## 2012-05-04 NOTE — Patient Instructions (Addendum)
The current medical regimen is effective;  continue present plan and medications.  Follow up in 1 year with Dr Hochrein.  You will receive a letter in the mail 2 months before you are due.  Please call us when you receive this letter to schedule your follow up appointment.  

## 2012-05-04 NOTE — Assessment & Plan Note (Signed)
Her blood pressure was slightly elevated today but she didn't take her medications. No change in therapy is indicated.

## 2012-05-04 NOTE — Assessment & Plan Note (Signed)
Her hemoglobin A1c was 6.6. She will continue medications as listed.

## 2012-05-04 NOTE — Assessment & Plan Note (Signed)
She seems to be euvolemic.  At this point, no change in therapy is indicated.  We have reviewed salt and fluid restrictions.  No further cardiovascular testing is indicated.   

## 2012-05-04 NOTE — Progress Notes (Signed)
HPI HPI the patient presents for one-year followup. Since I last saw her she has had no new cardiovascular complaints. She doesn't exercise but she remains active in her yard.  Planting a garden she does not get a cardiovascular symptoms. The patient denies any new symptoms such as chest discomfort, neck or arm discomfort. There has been no new shortness of breath, PND or orthopnea. There have been no reported palpitations, presyncope or syncope.   Allergies  Allergen Reactions  . Propoxyphene-Acetaminophen Other (See Comments)    Numbness all over. "floating" sensation.    Current Outpatient Prescriptions  Medication Sig Dispense Refill  . amLODipine (NORVASC) 5 MG tablet Take 7.5 mg by mouth daily.       . Artificial Tear Ointment (DRY EYES OP) Apply 1 drop to eye daily as needed. For dry eye/ red eye       . aspirin 81 MG EC tablet Take 81 mg by mouth at bedtime.       Marland Kitchen atorvastatin (LIPITOR) 20 MG tablet Take 20 mg by mouth at bedtime.       . cephALEXin (KEFLEX) 500 MG capsule Take 500 mg by mouth 3 (three) times daily.      . clopidogrel (PLAVIX) 75 MG tablet Take 75 mg by mouth daily.        Marland Kitchen esomeprazole (NEXIUM) 40 MG capsule Take 40 mg by mouth daily as needed. For acid reflux      . furosemide (LASIX) 20 MG tablet Take 20 mg by mouth daily as needed. For fluid      . HYDROcodone-acetaminophen (VICODIN) 5-500 MG per tablet Take 1 tablet by mouth every 6 (six) hours as needed. For pain        . insulin NPH (HUMULIN N,NOVOLIN N) 100 UNIT/ML injection Inject 95 Units into the skin daily after breakfast. Check blood sugar levels every day at bedtime. If levels are 200= 5 units, 250= 10 units, if 300= 20 units, If 350 or more= ER      . lisinopril (PRINIVIL,ZESTRIL) 20 MG tablet Take 20 mg by mouth 2 (two) times daily.        Marland Kitchen LOPRESSOR 50 MG tablet TAKE (1) TABLET TWICE DAILY.  60 each  1  . LORazepam (ATIVAN) 1 MG tablet Take 1 mg by mouth every 8 (eight) hours. 1/2 TAB      .  meclizine (ANTIVERT) 25 MG tablet Take 25 mg by mouth 3 (three) times daily as needed. dizziness       . multivitamin (THERAGRAN) per tablet Take 1 tablet by mouth at bedtime.       . nitroGLYCERIN (NITROSTAT) 0.4 MG SL tablet Place 0.4 mg under the tongue every 5 (five) minutes as needed.        Marland Kitchen PARoxetine (PAXIL) 10 MG tablet Take 10 mg by mouth every morning.          Past Medical History  Diagnosis Date  . Anemia   . Diabetes mellitus   . Hypertension   . Iron deficiency anemia 05/27/2011  . CHF (congestive heart failure)     EF preserved Echo 2012  . CVA (cerebral infarction)   . Carotid stenosis   . Renal insufficiency     Past Surgical History  Procedure Date  . Cholecystectomy   . Coronary artery bypass graft 2006    ROS:  She has right shoulder pain and leg pain.  She has a "boil" on her back.  Otherwise as stated in  the HPI and negative for all other systems.  PHYSICAL EXAM BP 140/60  Pulse 58  Ht 5\' 6"  (1.676 m)  Wt 195 lb (88.451 kg)  BMI 31.47 kg/m2 GENERAL:  Well appearing HEENT:  Pupils equal round and reactive, fundi not visualized, oral mucosa unremarkable, dentures NECK:  No jugular venous distention, waveform within normal limits, carotid upstroke brisk and symmetric, no bruits, no thyromegaly LYMPHATICS:  No cervical, inguinal adenopathy LUNGS:  Clear to auscultation bilaterally BACK:  No CVA tenderness CHEST:  Well healed sternotomy scar. HEART:  PMI not displaced or sustained,S1 and S2 within normal limits, no S3, no S4, no clicks, no rubs, no murmurs ABD:  Flat, positive bowel sounds normal in frequency in pitch, no bruits, no rebound, no guarding, no midline pulsatile mass, no hepatomegaly, no splenomegaly EXT:  2 plus pulses throughout, no edema, no cyanosis no clubbing  EKG:  Sinus rhythm, rate 58, axis within normal limits, intervals within normal limits, no acute ST-T wave changes, inferolateral ST depressions unchanged from previous  05/04/2012  ASSESSMENT AND PLAN

## 2012-06-07 ENCOUNTER — Other Ambulatory Visit: Payer: Self-pay | Admitting: Cardiology

## 2012-06-07 NOTE — Telephone Encounter (Signed)
..   Requested Prescriptions   Pending Prescriptions Disp Refills  . LOPRESSOR 50 MG tablet [Pharmacy Med Name: LOPRESSOR 50MG  TABLET] 60 each 10    Sig: TAKE (1) TABLET TWICE DAILY.

## 2012-06-23 ENCOUNTER — Encounter (HOSPITAL_COMMUNITY): Payer: Medicare Other | Attending: Oncology

## 2012-06-23 DIAGNOSIS — E119 Type 2 diabetes mellitus without complications: Secondary | ICD-10-CM | POA: Insufficient documentation

## 2012-06-23 DIAGNOSIS — D509 Iron deficiency anemia, unspecified: Secondary | ICD-10-CM | POA: Insufficient documentation

## 2012-06-23 DIAGNOSIS — I509 Heart failure, unspecified: Secondary | ICD-10-CM | POA: Insufficient documentation

## 2012-06-23 DIAGNOSIS — I10 Essential (primary) hypertension: Secondary | ICD-10-CM | POA: Insufficient documentation

## 2012-06-23 DIAGNOSIS — E669 Obesity, unspecified: Secondary | ICD-10-CM | POA: Insufficient documentation

## 2012-06-23 DIAGNOSIS — D638 Anemia in other chronic diseases classified elsewhere: Secondary | ICD-10-CM | POA: Insufficient documentation

## 2012-06-23 DIAGNOSIS — I69928 Other speech and language deficits following unspecified cerebrovascular disease: Secondary | ICD-10-CM | POA: Insufficient documentation

## 2012-06-23 LAB — CBC
HCT: 35.6 % — ABNORMAL LOW (ref 36.0–46.0)
Hemoglobin: 11.9 g/dL — ABNORMAL LOW (ref 12.0–15.0)
MCH: 29.7 pg (ref 26.0–34.0)
MCHC: 33.4 g/dL (ref 30.0–36.0)
MCV: 88.8 fL (ref 78.0–100.0)
Platelets: 174 10*3/uL (ref 150–400)
RBC: 4.01 MIL/uL (ref 3.87–5.11)
RDW: 13.5 % (ref 11.5–15.5)
WBC: 6.5 10*3/uL (ref 4.0–10.5)

## 2012-06-23 LAB — IRON AND TIBC
Iron: 71 ug/dL (ref 42–135)
Saturation Ratios: 24 % (ref 20–55)

## 2012-06-23 LAB — FERRITIN: Ferritin: 42 ng/mL (ref 10–291)

## 2012-06-23 NOTE — Progress Notes (Signed)
Labs drawn today for cbc,ferr,Iron and IBC 

## 2012-06-24 ENCOUNTER — Encounter (HOSPITAL_BASED_OUTPATIENT_CLINIC_OR_DEPARTMENT_OTHER): Payer: Medicare Other | Admitting: Oncology

## 2012-06-24 ENCOUNTER — Encounter (HOSPITAL_COMMUNITY): Payer: Self-pay | Admitting: Oncology

## 2012-06-24 VITALS — BP 115/60 | HR 59 | Temp 98.6°F | Wt 197.1 lb

## 2012-06-24 DIAGNOSIS — D638 Anemia in other chronic diseases classified elsewhere: Secondary | ICD-10-CM

## 2012-06-24 DIAGNOSIS — E119 Type 2 diabetes mellitus without complications: Secondary | ICD-10-CM

## 2012-06-24 NOTE — Patient Instructions (Addendum)
[name]  [id number] 08/09/1931 Dr. Everardo All   [facility]  Discharge Instructions  RECOMMENDATIONS MADE BY THE CONSULTANT AND ANY TEST RESULTS WILL BE SENT TO YOUR REFERRING DOCTOR.   EXAM FINDINGS BY MD TODAY AND SIGNS AND SYMPTOMS TO REPORT TO CLINIC OR PRIMARY MD: you are doing well.  Report increased ice intake, shortness of breath or fatigue.  MEDICATIONS PRESCRIBED: none    SPECIAL INSTRUCTIONS/FOLLOW-UP: Lab work Needed in 6 months and Return to Clinic in 6 months to see MD.   I acknowledge that I have been informed and understand all the instructions given to me and received a copy. I do not have any more questions at this time, but understand that I may call the Specialty Clinic at [facility] at [phone] during business hours should I have any further questions or need assistance in obtaining follow-up care.    __________________________________________  _____________  __________ Signature of Patient or Authorized Representative            Date                   Time    __________________________________________ Nurse's Signature

## 2012-06-24 NOTE — Progress Notes (Signed)
[name], [place] [address]  1. Anemia of chronic disease  CBC, Ferritin    CURRENT THERAPY: Observation  INTERVAL HISTORY: [name] 78 y.o. female returns for  regular  visit for followup of  Anemia of chronic disease much improved since controlling her diabetes.    She is doing very well.  She fell in the spring and has underwent physical therapy for her right shoulder injury from the fall.  She is doing well in that regard.  I personally reviewed and went over laboratory results with the patient.  Her ferritin is stable at 42 and her iron studies are unremarkable with a low normal TIBC indicative of anemia of chronic disease. Her hemoglobin is very good and stable at 11.9.    I provided the patient with some education regarding anemia of chronic disease.  She is understanding of this.  She is accompanied by her daughter.  Past Medical History  Diagnosis Date  . Anemia   . Diabetes mellitus   . Hypertension   . Iron deficiency anemia 05/27/2011  . CHF (congestive heart failure)     EF preserved Echo 2012  . CVA (cerebral infarction)   . Carotid stenosis   . Renal insufficiency   . Furuncle of back, except buttock     has DM; DYSLIPIDEMIA; HYPERTENSION; CAD; ACUTE DIASTOLIC HEART FAILURE; CAROTID STENOSIS; Iron deficiency anemia; Anemia of chronic disease; and Carotid stenosis on her problem list.     is allergic to propoxyphene-acetaminophen.  [name] had no medications administered during this visit.  Past Surgical History  Procedure Date  . Cholecystectomy   . Coronary artery bypass graft 2006  . I & d of furuncle April 2013    Denies any headaches, dizziness, double vision, fevers, chills, night sweats, nausea, vomiting, diarrhea, constipation, chest pain, heart palpitations, shortness of breath, blood in stool, black tarry stool, urinary pain, urinary burning, urinary frequency, hematuria.   PHYSICAL EXAMINATION  ECOG  PERFORMANCE STATUS: 0 - Asymptomatic  Filed Vitals:   06/24/12 0937  BP: 115/60  Pulse: 59  Temp: 98.6 F (37 C)    GENERAL:alert, no distress, well nourished, well developed, comfortable, cooperative and smiling SKIN: skin color, texture, turgor are normal, no rashes or significant lesions HEAD: Normocephalic, No masses, lesions, tenderness or abnormalities EYES: normal, Conjunctiva are pink and non-injected EARS: External ears normal OROPHARYNX:lips, buccal mucosa, and tongue normal and mucous membranes are moist  NECK: supple, trachea midline LYMPH:  no palpable lymphadenopathy BREAST:not examined LUNGS: clear to auscultation  HEART: regular rate & rhythm, no murmurs, no gallops, S1 normal and S2 normal ABDOMEN:abdomen soft, non-tender and normal bowel sounds, obese BACK: Back symmetric, no curvature. EXTREMITIES:less then 2 second capillary refill, no joint deformities, effusion, or inflammation, no skin discoloration, no clubbing, no cyanosis  NEURO: alert & oriented x 3 with fluent speech, no focal motor/sensory deficits, gait normal   LABORATORY DATA: CBC    Component Value Date/Time   WBC 6.5 06/23/2012 0855   WBC 8.8 01/28/2010 1311   RBC 4.01 06/23/2012 0855   RBC 3.14* 01/28/2010 1311   HGB 11.9* 06/23/2012 0855   HGB 8.6* 01/28/2010 1311   HCT 35.6* 06/23/2012 0855   HCT 27.1* 01/28/2010 1311   PLT 174 06/23/2012 0855   PLT 445* 01/28/2010 1311   MCV 88.8 06/23/2012 0855   MCV 86.3 01/28/2010 1311   MCH 29.7 06/23/2012 0855   MCH 27.4 01/28/2010 1311   MCHC  33.4 06/23/2012 0855   MCHC 31.7 01/28/2010 1311   RDW 13.5 06/23/2012 0855   RDW 16.2* 01/28/2010 1311   LYMPHSABS 1.5 02/07/2010 0500   LYMPHSABS 1.0 01/28/2010 1311   MONOABS 0.6 02/07/2010 0500   MONOABS 0.3 01/28/2010 1311   EOSABS 0.2 02/07/2010 0500   EOSABS 0.1 01/28/2010 1311   BASOSABS 0.0 02/07/2010 0500   BASOSABS 0.0 01/28/2010 1311    Lab Results  Component Value Date   IRON 71 06/23/2012   TIBC 298  06/23/2012   FERRITIN 42 06/23/2012       ASSESSMENT:  1. Anemia of chronic disease much improved since controlling her diabetes.  2. Diabetes mellitus.  3. Obesity, still weighing 196 pounds on a 5 foot 6 inch frame.  4. History of a stroke with residual deficits primarily of the speech.  5. Hypertension.  6. Heart disease with a decreased ejection fraction but denies shortness of breath presently.  7. Mild renal insufficiency.  8. Cholecystectomy in the past.  9. Open heart surgery in 2006.  10.Stent to her heart in 2004. 11. Carotid  Dopplers December 07, 2111 which showed mild to moderate stenosis only, not in need of therapeutic intervention.   PLAN:  1. I personally reviewed and went over laboratory results with the patient. 2. Lab work in 6 months: CBC, ferritin 3. Continue good diabetes control. 4. Return in 6 months for follow-up.   All questions were answered. The patient knows to call the clinic with any problems, questions or concerns. We can certainly see the patient much sooner if necessary.  KEFALAS,THOMAS

## 2012-11-28 ENCOUNTER — Encounter (INDEPENDENT_AMBULATORY_CARE_PROVIDER_SITE_OTHER): Payer: Medicaid Other

## 2012-11-28 DIAGNOSIS — I6529 Occlusion and stenosis of unspecified carotid artery: Secondary | ICD-10-CM

## 2012-11-29 ENCOUNTER — Other Ambulatory Visit: Payer: Self-pay | Admitting: Cardiology

## 2012-11-29 MED ORDER — METOPROLOL TARTRATE 50 MG PO TABS: 50.0000 mg | ORAL_TABLET | Freq: Two times a day (BID) | ORAL | Status: DC

## 2012-12-26 ENCOUNTER — Encounter (HOSPITAL_COMMUNITY): Payer: Medicare Other | Attending: Oncology

## 2012-12-26 DIAGNOSIS — D638 Anemia in other chronic diseases classified elsewhere: Secondary | ICD-10-CM | POA: Insufficient documentation

## 2012-12-26 DIAGNOSIS — Z8673 Personal history of transient ischemic attack (TIA), and cerebral infarction without residual deficits: Secondary | ICD-10-CM | POA: Insufficient documentation

## 2012-12-26 DIAGNOSIS — E669 Obesity, unspecified: Secondary | ICD-10-CM | POA: Insufficient documentation

## 2012-12-26 DIAGNOSIS — E119 Type 2 diabetes mellitus without complications: Secondary | ICD-10-CM | POA: Insufficient documentation

## 2012-12-26 DIAGNOSIS — I10 Essential (primary) hypertension: Secondary | ICD-10-CM | POA: Insufficient documentation

## 2012-12-26 LAB — CBC
MCH: 29.5 pg (ref 26.0–34.0)
MCV: 88.2 fL (ref 78.0–100.0)
Platelets: 187 10*3/uL (ref 150–400)
RBC: 4.14 MIL/uL (ref 3.87–5.11)
RDW: 13.2 % (ref 11.5–15.5)
WBC: 8.7 10*3/uL (ref 4.0–10.5)

## 2012-12-26 LAB — FERRITIN: Ferritin: 73 ng/mL (ref 10–291)

## 2012-12-26 NOTE — Progress Notes (Signed)
Labs drawn today for cbc,ferr 

## 2012-12-27 ENCOUNTER — Encounter (HOSPITAL_BASED_OUTPATIENT_CLINIC_OR_DEPARTMENT_OTHER): Payer: Medicare Other | Admitting: Oncology

## 2012-12-27 ENCOUNTER — Encounter (HOSPITAL_COMMUNITY): Payer: Self-pay | Admitting: Oncology

## 2012-12-27 VITALS — BP 129/65 | HR 63 | Temp 98.6°F | Resp 18 | Wt 196.7 lb

## 2012-12-27 DIAGNOSIS — E119 Type 2 diabetes mellitus without complications: Secondary | ICD-10-CM

## 2012-12-27 DIAGNOSIS — D638 Anemia in other chronic diseases classified elsewhere: Secondary | ICD-10-CM

## 2012-12-27 NOTE — Progress Notes (Signed)
Problem number 1 anemia of chronic disease with now normality of her hemoglobin. This was felt to be do to uncontrolled diabetes mellitus. Problem #2 diabetes mellitus still in need of good control by the patient. She does not follow a diabetic diet all the time. Problem #3 obesity still weighing 196 pounds Problem #4 hypertension with history of a stroke with residual deficits primarily of her speech Problem #5 mild renal insufficiency Problem #6 open heart surgery 2006 with a stent placement in 2004 Problem #7 decreased ejection fraction  Her blood counts have done so well since she is at least made an attempt to control her sugar. Nevertheless she has episodes of sugars over 200 on several occasions going to her and her daughter who accompanied her today.  Since her hemoglobin is now in the normal range I do not think we need to follow her again unless there is a problem. We will be happy to see her again in the future but I did talk to her quite extensively about follow a diabetic diet and keeping her sugar under control which makes a tremendous difference with her other medical issues as well as her anemia of chronic disease.

## 2012-12-27 NOTE — Patient Instructions (Addendum)
.  [facility] Discharge Instructions  RECOMMENDATIONS MADE BY THE CONSULTANT AND ANY TEST RESULTS WILL BE SENT TO YOUR REFERRING PHYSICIAN.  EXAM FINDINGS BY THE PHYSICIAN TODAY AND SIGNS OR SYMPTOMS TO REPORT TO CLINIC OR PRIMARY PHYSICIAN: hemoglobin is great Work on your diabetes control  SPECIAL INSTRUCTIONS/FOLLOW-UP: We will see you back only if needed.  Thank you for choosing [facility] to provide your oncology and hematology care.  To afford each patient quality time with our providers, please arrive at least 15 minutes before your scheduled appointment time.  With your help, our goal is to use those 15 minutes to complete the necessary work-up to ensure our physicians have the information they need to help with your evaluation and healthcare recommendations.    Effective January 1st, 2014, we ask that you re-schedule your appointment with our physicians should you arrive 10 or more minutes late for your appointment.  We strive to give you quality time with our providers, and arriving late affects you and other patients whose appointments are after yours.    Again, thank you for choosing [facility].  Our hope is that these requests will decrease the amount of time that you wait before being seen by our physicians.       _____________________________________________________________  Should you have questions after your visit to [facility], please contact our office at [phone] between the hours of 8:30 a.m. and 5:00 p.m.  Voicemails left after 4:30 p.m. will not be returned until the following business day.  For prescription refill requests, have your pharmacy contact our office with your prescription refill request.

## 2013-02-24 ENCOUNTER — Telehealth: Payer: Self-pay | Admitting: General Practice

## 2013-02-24 ENCOUNTER — Encounter: Payer: Self-pay | Admitting: General Practice

## 2013-02-24 ENCOUNTER — Ambulatory Visit (INDEPENDENT_AMBULATORY_CARE_PROVIDER_SITE_OTHER): Payer: Medicare Other | Admitting: General Practice

## 2013-02-24 VITALS — BP 164/65 | HR 55 | Temp 97.2°F | Ht 66.0 in | Wt 202.0 lb

## 2013-02-24 DIAGNOSIS — E559 Vitamin D deficiency, unspecified: Secondary | ICD-10-CM

## 2013-02-24 DIAGNOSIS — K219 Gastro-esophageal reflux disease without esophagitis: Secondary | ICD-10-CM

## 2013-02-24 DIAGNOSIS — E119 Type 2 diabetes mellitus without complications: Secondary | ICD-10-CM

## 2013-02-24 DIAGNOSIS — E785 Hyperlipidemia, unspecified: Secondary | ICD-10-CM

## 2013-02-24 DIAGNOSIS — I10 Essential (primary) hypertension: Secondary | ICD-10-CM

## 2013-02-24 DIAGNOSIS — M2550 Pain in unspecified joint: Secondary | ICD-10-CM

## 2013-02-24 DIAGNOSIS — F329 Major depressive disorder, single episode, unspecified: Secondary | ICD-10-CM

## 2013-02-24 DIAGNOSIS — D649 Anemia, unspecified: Secondary | ICD-10-CM

## 2013-02-24 DIAGNOSIS — R42 Dizziness and giddiness: Secondary | ICD-10-CM

## 2013-02-24 LAB — BASIC METABOLIC PANEL WITH GFR
BUN: 24 mg/dL — ABNORMAL HIGH (ref 6–23)
Chloride: 100 mEq/L (ref 96–112)
Creat: 1.05 mg/dL (ref 0.50–1.10)
GFR, Est African American: 58 mL/min — ABNORMAL LOW
Glucose, Bld: 169 mg/dL — ABNORMAL HIGH (ref 70–99)
Potassium: 4.8 mEq/L (ref 3.5–5.3)

## 2013-02-24 LAB — CBC WITH DIFFERENTIAL/PLATELET
Basophils Absolute: 0 10*3/uL (ref 0.0–0.1)
Basophils Relative: 1 % (ref 0–1)
Eosinophils Absolute: 0.2 10*3/uL (ref 0.0–0.7)
MCH: 28.5 pg (ref 26.0–34.0)
MCHC: 33.1 g/dL (ref 30.0–36.0)
Neutro Abs: 2.6 10*3/uL (ref 1.7–7.7)
Neutrophils Relative %: 56 % (ref 43–77)
Platelets: 165 10*3/uL (ref 150–400)

## 2013-02-24 LAB — POCT GLYCOSYLATED HEMOGLOBIN (HGB A1C): Hemoglobin A1C: 7.6

## 2013-02-24 LAB — HEPATIC FUNCTION PANEL
ALT: 10 U/L (ref 0–35)
AST: 13 U/L (ref 0–37)
Albumin: 3.8 g/dL (ref 3.5–5.2)
Alkaline Phosphatase: 48 U/L (ref 39–117)
Total Bilirubin: 0.5 mg/dL (ref 0.3–1.2)

## 2013-02-24 MED ORDER — LISINOPRIL 5 MG PO TABS: 5.0000 mg | ORAL_TABLET | Freq: Every day | ORAL | Status: DC

## 2013-02-24 MED ORDER — INSULIN NPH (HUMAN) (ISOPHANE) 100 UNIT/ML ~~LOC~~ SUSP: 95.0000 [IU] | Freq: Every day | SUBCUTANEOUS | Status: DC

## 2013-02-24 MED ORDER — AMLODIPINE BESYLATE 5 MG PO TABS: 7.5000 mg | ORAL_TABLET | Freq: Every day | ORAL | Status: DC

## 2013-02-24 MED ORDER — FUROSEMIDE 20 MG PO TABS: 20.0000 mg | ORAL_TABLET | Freq: Every day | ORAL | Status: DC | PRN

## 2013-02-24 MED ORDER — LORAZEPAM 1 MG PO TABS: 1.0000 mg | ORAL_TABLET | Freq: Three times a day (TID) | ORAL | Status: DC

## 2013-02-24 MED ORDER — PAROXETINE HCL 10 MG PO TABS: 10.0000 mg | ORAL_TABLET | ORAL | Status: DC

## 2013-02-24 MED ORDER — HYDROCODONE-ACETAMINOPHEN 5-325 MG PO TABS: 1.0000 | ORAL_TABLET | Freq: Three times a day (TID) | ORAL | Status: DC | PRN

## 2013-02-24 MED ORDER — ESOMEPRAZOLE MAGNESIUM 40 MG PO CPDR: 40.0000 mg | DELAYED_RELEASE_CAPSULE | Freq: Every day | ORAL | Status: DC | PRN

## 2013-02-24 MED ORDER — CLOPIDOGREL BISULFATE 75 MG PO TABS: 75.0000 mg | ORAL_TABLET | Freq: Every day | ORAL | Status: DC

## 2013-02-24 MED ORDER — METOPROLOL TARTRATE 50 MG PO TABS: 50.0000 mg | ORAL_TABLET | Freq: Two times a day (BID) | ORAL | Status: DC

## 2013-02-24 MED ORDER — MECLIZINE HCL 25 MG PO TABS: 25.0000 mg | ORAL_TABLET | Freq: Three times a day (TID) | ORAL | Status: DC | PRN

## 2013-02-24 MED ORDER — ATORVASTATIN CALCIUM 20 MG PO TABS: 20.0000 mg | ORAL_TABLET | Freq: Every day | ORAL | Status: DC

## 2013-02-24 NOTE — Progress Notes (Signed)
  Subjective:    Patient ID: [name], female    DOB: 20-Sep-1931, 79 y.o.   MRN: [id number]  Diabetes Hypoglycemia symptoms include speech difficulty. Pertinent negatives for diabetes include no polydipsia and no polyphagia.  Hyperlipidemia Pertinent negatives include no shortness of breath.   Patient presents today for three month follow up. Hypertension, Diabetes, hyperlipidemia, depression, gerd, and arthritis. Reports being a diabetic for 24-25 years. Patient reports monitoring blood sugars 3 to 4 times a week, range from 100 to 200. Reports 5 episodes of hypoglycemia blood sugar 57-70. Denies ulcerations to feet or broken skin. Reports last eye exam was in August of 2013.  Reports taking blood pressure if feeling dizzy, last blood pressure at home was 180/60.  Reports having CVA x 2. Reports right side weaker than left. Reports mild swelling in legs at times.    Review of Systems  Constitutional: Negative for fever and appetite change.  Eyes:       Reports vision in left eye is less than 20/20. Right eye 20/20 with glasses  Respiratory: Negative for chest tightness and shortness of breath.   Cardiovascular: Positive for leg swelling.       Lower leg swelling  Gastrointestinal: Negative for abdominal pain, diarrhea, blood in stool and abdominal distention.       Reports bowel movement every  1 to 3 days.   Endocrine: Negative for polydipsia and polyphagia.  Genitourinary: Negative for frequency, difficulty urinating and pelvic pain.       Frequency occurs at night.  Musculoskeletal: Positive for back pain.       Chronic back pain, due to arthritis  Skin: Negative for color change and rash.  Neurological: Positive for facial asymmetry and speech difficulty.       Mild Left facial asymmetry and slight expressive aphasia. CVA (one year ago and 10 years ago)  Psychiatric/Behavioral: Negative.         Objective:   Physical Exam  Constitutional: She appears well-developed and  well-nourished.  Eyes: Right eye exhibits no discharge.  Weak EOM  Neck: Normal range of motion.  Cardiovascular: Normal rate and regular rhythm.   Murmur heard. Grade 1 murmur. Non pitting edema noted to bilateral lower extremities.    Pulmonary/Chest: Effort normal and breath sounds normal. No respiratory distress. She exhibits no tenderness.  Abdominal: Soft. Bowel sounds are normal. She exhibits no distension. There is no tenderness.  Musculoskeletal: She exhibits edema and tenderness.  Knees tender and decreased range of motion due arthritis. Edema to right knee Left hand grip weaker than right  Lymphadenopathy:    She has no cervical adenopathy.  Neurological: She is alert. She has normal reflexes. No cranial nerve deficit.  EOM weak, slight left facial asymmetry, and slight expressive aphasia  Skin: Skin is warm and dry. No rash noted.  Psychiatric: She has a normal mood and affect.   Results for orders placed in visit on 02/24/13  POCT GLYCOSYLATED HEMOGLOBIN (HGB A1C)      Result Value Range   Hemoglobin A1C 7.6            Assessment & Plan:  Continue all current medications Labs pending, cmp, lipid panel F/u in 3 months Discussed exercise and diet     Almyra Free, FNP-C

## 2013-02-24 NOTE — Patient Instructions (Addendum)
Depression, Adult Depression refers to feeling sad, low, down in the dumps, blue, gloomy, or empty. In general, there are two kinds of depression: 1. Depression that we all experience from time to time because of upsetting life experiences, including the loss of a job or the ending of a relationship (normal sadness or normal grief). This kind of depression is considered normal, is short lived, and resolves within a few days to 2 weeks. (Depression experienced after the loss of a loved one is called bereavement. Bereavement often lasts longer than 2 weeks but normally gets better with time.) 2. Clinical depression, which lasts longer than normal sadness or normal grief or interferes with your ability to function at home, at work, and in school. It also interferes with your personal relationships. It affects almost every aspect of your life. Clinical depression is an illness. Symptoms of depression also can be caused by conditions other than normal sadness and grief or clinical depression. Examples of these conditions are listed as follows:  Physical illness Some physical illnesses, including underactive thyroid gland (hypothyroidism), severe anemia, specific types of cancer, diabetes, uncontrolled seizures, heart and lung problems, strokes, and chronic pain are commonly associated with symptoms of depression.  Side effects of some prescription medicine In some people, certain types of prescription medicine can cause symptoms of depression.  Substance abuse Abuse of alcohol and illicit drugs can cause symptoms of depression. SYMPTOMS Symptoms of normal sadness and normal grief include the following:  Feeling sad or crying for short periods of time.  Not caring about anything (apathy).  Difficulty sleeping or sleeping too much.  No longer able to enjoy the things you used to enjoy.  Desire to be by oneself all the time (social isolation).  Lack of energy or motivation.  Difficulty  concentrating or remembering.  Change in appetite or weight.  Restlessness or agitation. Symptoms of clinical depression include the same symptoms of normal sadness or normal grief and also the following symptoms:  Feeling sad or crying all the time.  Feelings of guilt or worthlessness.  Feelings of hopelessness or helplessness.  Thoughts of suicide or the desire to harm yourself (suicidal ideation).  Loss of touch with reality (psychotic symptoms). Seeing or hearing things that are not real (hallucinations) or having false beliefs about your life or the people around you (delusions and paranoia). DIAGNOSIS  The diagnosis of clinical depression usually is based on the severity and duration of the symptoms. Your caregiver also will ask you questions about your medical history and substance use to find out if physical illness, use of prescription medicine, or substance abuse is causing your depression. Your caregiver also may order blood tests. TREATMENT  Typically, normal sadness and normal grief do not require treatment. However, sometimes antidepressant medicine is prescribed for bereavement to ease the depressive symptoms until they resolve. The treatment for clinical depression depends on the severity of your symptoms but typically includes antidepressant medicine, counseling with a mental health professional, or a combination of both. Your caregiver will help to determine what treatment is best for you. Depression caused by physical illness usually goes away with appropriate medical treatment of the illness. If prescription medicine is causing depression, talk with your caregiver about stopping the medicine, decreasing the dose, or substituting another medicine. Depression caused by abuse of alcohol or illicit drugs abuse goes away with abstinence from these substances. Some adults need professional help in order to stop drinking or using drugs. SEEK IMMEDIATE CARE IF:  You have   thoughts  about hurting yourself or others.  You lose touch with reality (have psychotic symptoms).  You are taking medicine for depression and have a serious side effect. FOR MORE INFORMATION National Alliance on Mental Illness: www.nami.[employer]: https://carter.com/ Document Released: 11/20/2000 Document Revised: 05/24/2012 Document Reviewed: 02/22/2012 [facility] Patient Information 2013 [place].  Hypertension As your heart beats, it forces blood through your arteries. This force is your blood pressure. If the pressure is too high, it is called hypertension (HTN) or high blood pressure. HTN is dangerous because you may have it and not know it. High blood pressure may mean that your heart has to work harder to pump blood. Your arteries may be narrow or stiff. The extra work puts you at risk for heart disease, stroke, and other problems.  Blood pressure consists of two numbers, a higher number over a lower, 110/72, for example. It is stated as "110 over 72." The ideal is below 120 for the top number (systolic) and under 80 for the bottom (diastolic). Write down your blood pressure today. You should pay close attention to your blood pressure if you have certain conditions such as:  Heart failure.  Prior heart attack.  Diabetes  Chronic kidney disease.  Prior stroke.  Multiple risk factors for heart disease. To see if you have HTN, your blood pressure should be measured while you are seated with your arm held at the level of the heart. It should be measured at least twice. A one-time elevated blood pressure reading (especially in the Emergency Department) does not mean that you need treatment. There may be conditions in which the blood pressure is different between your right and left arms. It is important to see your caregiver soon for a recheck. Most people have essential hypertension which means that there is not a specific cause. This type of high blood  pressure may be lowered by changing lifestyle factors such as:  Stress.  Smoking.  Lack of exercise.  Excessive weight.  Drug/tobacco/alcohol use.  Eating less salt. Most people do not have symptoms from high blood pressure until it has caused damage to the body. Effective treatment can often prevent, delay or reduce that damage. TREATMENT  When a cause has been identified, treatment for high blood pressure is directed at the cause. There are a large number of medications to treat HTN. These fall into several categories, and your caregiver will help you select the medicines that are best for you. Medications may have side effects. You should review side effects with your caregiver. If your blood pressure stays high after you have made lifestyle changes or started on medicines,   Your medication(s) may need to be changed.  Other problems may need to be addressed.  Be certain you understand your prescriptions, and know how and when to take your medicine.  Be sure to follow up with your caregiver within the time frame advised (usually within two weeks) to have your blood pressure rechecked and to review your medications.  If you are taking more than one medicine to lower your blood pressure, make sure you know how and at what times they should be taken. Taking two medicines at the same time can result in blood pressure that is too low. SEEK IMMEDIATE MEDICAL CARE IF:  You develop a severe headache, blurred or changing vision, or confusion.  You have unusual weakness or numbness, or a faint feeling.  You have severe chest or abdominal pain, vomiting, or breathing  problems. MAKE SURE YOU:   Understand these instructions.  Will watch your condition.  Will get help right away if you are not doing well or get worse. Document Released: 11/23/2005 Document Revised: 02/15/2012 Document Reviewed: 07/13/2008 [facility] Patient Information 2013 [place].  Place gastroesophageal  reflux disease patient instructions here.

## 2013-02-24 NOTE — Telephone Encounter (Signed)
Discussed diet and exercise with patient

## 2013-03-01 LAB — NMR LIPOPROFILE WITH LIPIDS
HDL Size: 9.2 nm (ref >=9.2)
Large HDL-P: 7 umol/L (ref >=4.8)
Large VLDL-P: 1 nmol/L (ref <=2.7)
Small LDL Particle Number: 282 nmol/L (ref <=527)
Triglycerides: 127 mg/dL (ref <150)

## 2013-03-02 ENCOUNTER — Telehealth: Payer: Self-pay | Admitting: General Practice

## 2013-03-03 NOTE — Telephone Encounter (Signed)
Spoke with patient.

## 2013-03-08 ENCOUNTER — Other Ambulatory Visit: Payer: Self-pay | Admitting: *Deleted

## 2013-03-08 DIAGNOSIS — D509 Iron deficiency anemia, unspecified: Secondary | ICD-10-CM

## 2013-03-08 MED ORDER — NITROGLYCERIN 0.4 MG SL SUBL: 0.4000 mg | SUBLINGUAL_TABLET | SUBLINGUAL | Status: DC | PRN

## 2013-04-21 ENCOUNTER — Ambulatory Visit (INDEPENDENT_AMBULATORY_CARE_PROVIDER_SITE_OTHER): Payer: Medicare Other | Admitting: General Practice

## 2013-04-21 VITALS — BP 142/65 | HR 58 | Temp 97.2°F | Ht 66.0 in | Wt 201.0 lb

## 2013-04-21 DIAGNOSIS — T148XXA Other injury of unspecified body region, initial encounter: Secondary | ICD-10-CM

## 2013-04-21 NOTE — Progress Notes (Signed)
  Subjective:    [name], female    DOB: [birth year], 79 y.o.   MRN: [id number]  Chest Pain  This is a new problem. The current episode started yesterday. The onset quality is sudden. The problem occurs rarely. The problem has been unchanged. The pain is present in the lateral region. The pain is at a severity of 0/10 (denies pain currently, yesterday rated 3/10). The quality of the pain is described as burning. The pain does not radiate (left chest and side). Pertinent negatives include no abdominal pain, back pain, dizziness, exertional chest pressure, fever, leg pain, nausea, numbness, orthopnea, shortness of breath, syncope or weakness.  Patient reports she was digging holes yesterday to plant flowers and that is when chest/side pain began. Denies sweating, shortness of breath, dizziness upon pain occurring.    Review of Systems  Constitutional: Negative for fever and chills.  HENT: Negative for neck pain and neck stiffness.   Respiratory: Negative for choking, chest tightness and shortness of breath.   Cardiovascular: Positive for chest pain. Negative for orthopnea and syncope.       Currently not having  Gastrointestinal: Negative for nausea and abdominal pain.  Genitourinary: Negative for difficulty urinating.  Musculoskeletal: Negative for back pain.  Neurological: Negative for dizziness, weakness and numbness.  Psychiatric/Behavioral: Negative.        Objective:   Physical Exam  Constitutional: She appears well-developed and well-nourished.  Cardiovascular: Normal rate, regular rhythm and normal heart sounds.   Pulmonary/Chest: Effort normal and breath sounds normal. She exhibits no tenderness.  Musculoskeletal: She exhibits tenderness. She exhibits no edema.  Able to reproduce pain with palpation along left lateral upper side  Neurological: She is alert.  Expressive aphasia   Skin: Skin is warm and dry.  Psychiatric: She has a normal mood and affect.           Assessment & Plan:  Muscle strain EKG-normal sinus rhythm Rest Apply heat to affected area 3 times daily for 15 minutes Aleve as directed for mild discomfort Refrain from strenuous activity until resolved RTO if symptoms worsen Patient verbalized understanding Erby Pian, FNP-C

## 2013-04-21 NOTE — Patient Instructions (Signed)
Muscle Strain °Muscle strain occurs when a muscle is stretched beyond its normal length. A small number of muscle fibers generally are torn. This is especially common in athletes. This happens when a sudden, violent force placed on a muscle stretches it too far. Usually, recovery from muscle strain takes 1 to 2 weeks. Complete healing will take 5 to 6 weeks.  °HOME CARE INSTRUCTIONS  °· While awake, apply ice to the sore muscle for the first 2 days after the injury. °· Put ice in a plastic bag. °· Place a towel between your skin and the bag. °· Leave the ice on for 15 to 20 minutes each hour. °· Do not use the strained muscle for several days, until you no longer have pain. °· You may wrap the injured area with an elastic bandage for comfort. Be careful not to wrap it too tightly. This may interfere with blood circulation or increase swelling. °· Only take over-the-counter or prescription medicines for pain, discomfort, or fever as directed by your caregiver. °SEEK MEDICAL CARE IF:  °You have increasing pain or swelling in the injured area. °MAKE SURE YOU:  °· Understand these instructions. °· Will watch your condition. °· Will get help right away if you are not doing well or get worse. °Document Released: 11/23/2005 Document Revised: 02/15/2012 Document Reviewed: 12/05/2011 °[employer]® Patient Information ©2013 [employer]. ° °

## 2013-05-04 ENCOUNTER — Other Ambulatory Visit: Payer: Self-pay | Admitting: Family Medicine

## 2013-05-19 ENCOUNTER — Other Ambulatory Visit: Payer: Self-pay | Admitting: Urology

## 2013-05-19 ENCOUNTER — Ambulatory Visit (INDEPENDENT_AMBULATORY_CARE_PROVIDER_SITE_OTHER): Payer: Medicare Other | Admitting: Urology

## 2013-05-19 DIAGNOSIS — N200 Calculus of kidney: Secondary | ICD-10-CM

## 2013-05-19 DIAGNOSIS — N3946 Mixed incontinence: Secondary | ICD-10-CM

## 2013-05-19 DIAGNOSIS — N390 Urinary tract infection, site not specified: Secondary | ICD-10-CM

## 2013-05-19 DIAGNOSIS — N952 Postmenopausal atrophic vaginitis: Secondary | ICD-10-CM

## 2013-05-25 ENCOUNTER — Other Ambulatory Visit (HOSPITAL_COMMUNITY): Payer: Self-pay | Admitting: Urology

## 2013-05-25 ENCOUNTER — Ambulatory Visit (HOSPITAL_COMMUNITY)
Admission: RE | Admit: 2013-05-25 | Discharge: 2013-05-25 | Disposition: A | Discharge status: Home / Self Care | Payer: Medicare Other | Source: Ambulatory Visit | Attending: Urology | Admitting: Urology

## 2013-05-25 DIAGNOSIS — N200 Calculus of kidney: Secondary | ICD-10-CM | POA: Insufficient documentation

## 2013-05-26 ENCOUNTER — Ambulatory Visit (INDEPENDENT_AMBULATORY_CARE_PROVIDER_SITE_OTHER): Payer: Medicare Other | Admitting: Urology

## 2013-05-26 DIAGNOSIS — R339 Retention of urine, unspecified: Secondary | ICD-10-CM

## 2013-05-26 DIAGNOSIS — N3946 Mixed incontinence: Secondary | ICD-10-CM

## 2013-05-26 DIAGNOSIS — N318 Other neuromuscular dysfunction of bladder: Secondary | ICD-10-CM

## 2013-05-26 DIAGNOSIS — N390 Urinary tract infection, site not specified: Secondary | ICD-10-CM

## 2013-05-29 ENCOUNTER — Encounter: Payer: Self-pay | Admitting: General Practice

## 2013-05-29 ENCOUNTER — Ambulatory Visit (INDEPENDENT_AMBULATORY_CARE_PROVIDER_SITE_OTHER): Payer: Medicare Other | Admitting: General Practice

## 2013-05-29 ENCOUNTER — Encounter: Payer: Self-pay | Admitting: *Deleted

## 2013-05-29 ENCOUNTER — Ambulatory Visit: Payer: Medicare Other | Admitting: General Practice

## 2013-05-29 VITALS — BP 141/42 | HR 52 | Temp 98.4°F | Ht 64.5 in | Wt 200.0 lb

## 2013-05-29 DIAGNOSIS — E119 Type 2 diabetes mellitus without complications: Secondary | ICD-10-CM

## 2013-05-29 DIAGNOSIS — F411 Generalized anxiety disorder: Secondary | ICD-10-CM

## 2013-05-29 DIAGNOSIS — F329 Major depressive disorder, single episode, unspecified: Secondary | ICD-10-CM

## 2013-05-29 DIAGNOSIS — I10 Essential (primary) hypertension: Secondary | ICD-10-CM

## 2013-05-29 DIAGNOSIS — K219 Gastro-esophageal reflux disease without esophagitis: Secondary | ICD-10-CM

## 2013-05-29 DIAGNOSIS — E785 Hyperlipidemia, unspecified: Secondary | ICD-10-CM

## 2013-05-29 DIAGNOSIS — Z794 Long term (current) use of insulin: Secondary | ICD-10-CM

## 2013-05-29 LAB — POCT CBC
Hemoglobin: 12.4 g/dL (ref 12.2–16.2)
MCH, POC: 29.9 pg (ref 27–31.2)
MCV: 86.8 fL (ref 80–97)
MPV: 8.4 fL (ref 0–99.8)
RBC: 4.2 M/uL (ref 4.04–5.48)

## 2013-05-29 LAB — COMPLETE METABOLIC PANEL WITH GFR
AST: 16 U/L (ref 0–37)
Albumin: 3.7 g/dL (ref 3.5–5.2)
BUN: 19 mg/dL (ref 6–23)
Calcium: 9.5 mg/dL (ref 8.4–10.5)
Chloride: 104 mEq/L (ref 96–112)
Glucose, Bld: 159 mg/dL — ABNORMAL HIGH (ref 70–99)
Potassium: 5.1 mEq/L (ref 3.5–5.3)
Sodium: 139 mEq/L (ref 135–145)
Total Protein: 6.6 g/dL (ref 6.0–8.3)

## 2013-05-29 LAB — POCT URINALYSIS DIPSTICK
Bilirubin, UA: NEGATIVE
Ketones, UA: NEGATIVE
Spec Grav, UA: 1.025
pH, UA: 5

## 2013-05-29 LAB — POCT UA - MICROSCOPIC ONLY
Crystals, Ur, HPF, POC: NEGATIVE
WBC, Ur, HPF, POC: 1.3

## 2013-05-29 NOTE — Progress Notes (Signed)
[name], female   DOB: November 09, 1931, 79 y.o.   MRN: [id number] 02/24/13 signed RX (HYDROcodone-acetaminophen (NORCO/VICODIN) 5-325 MG per tablet)  left at front window not picked up. Rx destroyed.

## 2013-05-29 NOTE — Progress Notes (Signed)
[name], female   DOB: Apr 08, 1931, 79 y.o.   MRN: [id number] signed RX (Ativan) left at front window not picked up. Rx destroyed.

## 2013-05-29 NOTE — Patient Instructions (Signed)

## 2013-05-29 NOTE — Progress Notes (Signed)
  Subjective:    [name], female    DOB: Dec 03, 1931, 79 y.o.   MRN: [id number]  HPI Presents today for follow up of chronic health conditions. She has history of hypertension, hyperlipidemia, gerd, IDDM, GAD, and depression. She reports taking medications as prescribed. She reports checking blood sugars once to twice weekly. Reports checking only when suspected to be high or low. She reports checking blood pressure at home when feeling dizzy. She reports medication for anxiety and depression are effective. Denies mood swings. Reports she is scheduled for a mammogram on next  Monday. Reports eating a healthy diet.     Review of Systems  Constitutional: Negative for fever and chills.  HENT: Negative for neck pain and neck stiffness.   Respiratory: Negative for chest tightness and shortness of breath.   Cardiovascular: Negative for chest pain and palpitations.  Gastrointestinal: Negative for vomiting, abdominal pain, diarrhea and blood in stool.  Genitourinary: Negative for dysuria, hematuria and difficulty urinating.  Neurological: Negative for dizziness, weakness, numbness and headaches.       Objective:   Physical Exam  Constitutional: She is oriented to person, place, and time. She appears well-developed and well-nourished.  HENT:  Head: Normocephalic and atraumatic.  Right Ear: External ear normal.  Left Ear: External ear normal.  Eyes: EOM are normal.  Neck: Normal range of motion. Neck supple.  Cardiovascular: Normal rate, regular rhythm and normal heart sounds.   Pulmonary/Chest: Effort normal and breath sounds normal.  Abdominal: Soft. Bowel sounds are normal.  Neurological: She is alert and oriented to person, place, and time.  Skin: Skin is warm and dry.  Psychiatric: She has a normal mood and affect.          Assessment & Plan:  1. Other and unspecified hyperlipidemia - NMR Lipoprofile with Lipids  2. GERD (gastroesophageal reflux disease)  3.  Essential hypertension, benign - POCT CBC - COMPLETE METABOLIC PANEL WITH GFR - Vitamin D 25 hydroxy  4. IDDM (insulin dependent diabetes mellitus) - POCT glycosylated hemoglobin (Hb A1C)  5. GAD (generalized anxiety disorder)  6. Depression Continue all current medications Labs pending F/u in 3 months Discussed exercise and diet  Patient verbalized understanding Erby Pian, FNP-C

## 2013-05-30 LAB — NMR LIPOPROFILE WITH LIPIDS
HDL Particle Number: 33.7 umol/L (ref >=30.5)
HDL Size: 9.7 nm (ref >=9.2)
Large HDL-P: 8.7 umol/L (ref >=4.8)
Large VLDL-P: 3.6 nmol/L — ABNORMAL HIGH (ref <=2.7)
Triglycerides: 153 mg/dL — ABNORMAL HIGH (ref <150)

## 2013-05-30 LAB — VITAMIN D 25 HYDROXY (VIT D DEFICIENCY, FRACTURES): Vit D, 25-Hydroxy: 35 ng/mL (ref 30–89)

## 2013-06-02 ENCOUNTER — Other Ambulatory Visit: Payer: Self-pay | Admitting: General Practice

## 2013-06-05 ENCOUNTER — Other Ambulatory Visit: Payer: Self-pay | Admitting: *Deleted

## 2013-06-05 DIAGNOSIS — F329 Major depressive disorder, single episode, unspecified: Secondary | ICD-10-CM

## 2013-06-05 MED ORDER — LORAZEPAM 0.5 MG PO TABS: ORAL_TABLET | ORAL | Status: DC

## 2013-06-05 NOTE — Telephone Encounter (Signed)
LAST RF 05/04/13. CALL IN [place] [phone].

## 2013-07-04 ENCOUNTER — Other Ambulatory Visit: Payer: Self-pay | Admitting: General Practice

## 2013-07-07 ENCOUNTER — Other Ambulatory Visit: Payer: Self-pay

## 2013-07-07 MED ORDER — LORAZEPAM 0.5 MG PO TABS: ORAL_TABLET | ORAL | Status: DC

## 2013-07-07 NOTE — Telephone Encounter (Signed)
Please call into pharmacy. thx

## 2013-07-07 NOTE — Telephone Encounter (Signed)
Last seen Mae 05/29/13  Last filled 02/24/13   If approved phone in and route to nurse

## 2013-07-10 NOTE — Telephone Encounter (Signed)
Left refill authorization with pharmacist.

## 2013-07-19 ENCOUNTER — Ambulatory Visit (INDEPENDENT_AMBULATORY_CARE_PROVIDER_SITE_OTHER): Payer: Medicare Other | Admitting: Cardiology

## 2013-07-19 ENCOUNTER — Encounter: Payer: Self-pay | Admitting: Cardiology

## 2013-07-19 VITALS — BP 170/73 | HR 59 | Ht 64.5 in | Wt 204.0 lb

## 2013-07-19 DIAGNOSIS — I10 Essential (primary) hypertension: Secondary | ICD-10-CM

## 2013-07-19 DIAGNOSIS — I2510 Atherosclerotic heart disease of native coronary artery without angina pectoris: Secondary | ICD-10-CM

## 2013-07-19 DIAGNOSIS — I6529 Occlusion and stenosis of unspecified carotid artery: Secondary | ICD-10-CM

## 2013-07-19 DIAGNOSIS — Z951 Presence of aortocoronary bypass graft: Secondary | ICD-10-CM

## 2013-07-19 NOTE — Progress Notes (Signed)
HPI HPI the patient presents for one-year followup. Since I last saw her she has had no new cardiovascular complaints. She still does her yard work and housework. With this she does not get a cardiovascular symptoms. The patient denies any new symptoms such as chest discomfort, neck or arm discomfort. There has been no new shortness of breath, PND or orthopnea. There have been no reported palpitations, presyncope or syncope. He does have aches and pains associated she thinks is arthritis.   Allergies  Allergen Reactions  . Propoxyphene-Acetaminophen Other (See Comments)    Numbness all over. "floating" sensation.    Current Outpatient Prescriptions  Medication Sig Dispense Refill  . amLODipine (NORVASC) 5 MG tablet TAKE 1 & 1/2 TABLETS BY MOUTH ONCE DAILY.  45 tablet  5  . Artificial Tear Ointment (DRY EYES OP) Apply 1 drop to eye daily as needed. For dry eye/ red eye       . aspirin 81 MG EC tablet Take 81 mg by mouth at bedtime.       Marland Kitchen atorvastatin (LIPITOR) 20 MG tablet TAKE ONE TABLET DAILY AT BEDTIME.  30 tablet  5  . clopidogrel (PLAVIX) 75 MG tablet TAKE 1 TABLET ONCE DAILY.  30 tablet  2  . furosemide (LASIX) 20 MG tablet TAKE 1 TABLET ONCE DAILY AS NEEDED FOR FLUID.  30 tablet  5  . HUMULIN N 100 UNIT/ML injection INJECT 92 UNITS SUBCUTANEOUSLY IN THE MORNING AND 40 UNITS IN THE EVENING.  40 mL  0  . lisinopril (PRINIVIL,ZESTRIL) 5 MG tablet TAKE 1 TABLET ONCE DAILY.  30 tablet  4  . LORazepam (ATIVAN) 0.5 MG tablet TAKE 1 TABLET BY MOUTH 3 TIMES DAILY  90 tablet  0  . meclizine (ANTIVERT) 25 MG tablet TAKE (1) TABLET THREE TIMES DAILY AS NEEDED FOR DIZZINESS.  60 tablet  0  . metoprolol (LOPRESSOR) 50 MG tablet Take 1 tablet (50 mg total) by mouth 2 (two) times daily.  60 tablet  11  . Multiple Vitamins-Minerals (MULTIVITAMIN PO) Take 1 tablet by mouth daily. MVI with Iron      . NEXIUM 40 MG capsule TAKE 1 CAPSULE ONCE DAILY FOR ACID REFLUX.  30 capsule  5  . nitroGLYCERIN  (NITROSTAT) 0.4 MG SL tablet Place 1 tablet (0.4 mg total) under the tongue every 5 (five) minutes as needed.  25 tablet  0  . oxybutynin (DITROPAN-XL) 10 MG 24 hr tablet Take 10 mg by mouth daily.      Marland Kitchen PARoxetine (PAXIL) 10 MG tablet TAKE ONE TABLET DAILY IN THE MORNING.  30 tablet  2  . SURE COMFORT INSULIN SYRINGE 30G X 1/2" 1 ML MISC        No current facility-administered medications for this visit.    Past Medical History  Diagnosis Date  . Anemia   . Diabetes mellitus   . Hypertension   . Iron deficiency anemia 05/27/2011  . CHF (congestive heart failure)     EF preserved Echo 2012  . CVA (cerebral infarction)   . Carotid stenosis   . Renal insufficiency   . Furuncle of back, except buttock   . Vertigo     Past Surgical History  Procedure Laterality Date  . Cholecystectomy    . Coronary artery bypass graft  2006  . I & d of furuncle  April 2013    ROS:  As stated in the HPI and negative for all other systems.  PHYSICAL EXAM BP 170/73  Pulse 59  Ht 5' 4.5" (1.638 m)  Wt 204 lb (92.534 kg)  BMI 34.49 kg/m2 GENERAL:  Well appearing HEENT:  Pupils equal round and reactive, fundi not visualized, oral mucosa unremarkable, dentures NECK:  No jugular venous distention, waveform within normal limits, carotid upstroke brisk and symmetric, left bruits, no thyromegaly LYMPHATICS:  No cervical, inguinal adenopathy LUNGS:  Clear to auscultation bilaterally BACK:  No CVA tenderness CHEST:  Well healed sternotomy scar. HEART:  PMI not displaced or sustained,S1 and S2 within normal limits, no S3, no S4, no clicks, no rubs, no murmurs ABD:  Flat, positive bowel sounds normal in frequency in pitch, no bruits, no rebound, no guarding, no midline pulsatile mass, no hepatomegaly, no splenomegaly EXT:  2 plus pulses upper and diminished dorsalis pedis and posterior tibialis bilateral lower extremities, mild bilateral lower extremity ankle edema, no cyanosis no clubbing  EKG:  Sinus  rhythm, rate 61, axis within normal limits, intervals within normal limits, no acute ST-T wave changes, inferolateral ST depressions unchanged from previous 07/19/2013  ASSESSMENT AND PLAN  CAD:  It has been many years since she's had any screening testing done. She has some functional capacity probably around four METS. I would like to screen her with a stress test.  She would not be a walk on a treadmill so she will have aLexiscan Myoview.   CAROTID STENOSIS:  She has 40-59% bilateral stenosis and will have followup in December.  HTN:  She didn't take her blood pressure this morning. I reviewed recent readings and it is not typically this elevated. She will keep an eye on this at home. I will make no changes to her medicines.

## 2013-07-19 NOTE — Patient Instructions (Addendum)
Your physician has requested that you have a lexiscan myoview. For further information please visit [email or web address]. Please follow instruction sheet, as given. 07/31/13 @ 9:30 AT [place] OFFICE.Marland Kitchen

## 2013-07-25 ENCOUNTER — Ambulatory Visit (HOSPITAL_COMMUNITY): Payer: Medicare Other | Attending: Cardiology | Admitting: Radiology

## 2013-07-25 VITALS — BP 144/67 | Ht 66.0 in | Wt 201.0 lb

## 2013-07-25 DIAGNOSIS — R42 Dizziness and giddiness: Secondary | ICD-10-CM | POA: Insufficient documentation

## 2013-07-25 DIAGNOSIS — R0602 Shortness of breath: Secondary | ICD-10-CM

## 2013-07-25 DIAGNOSIS — I6529 Occlusion and stenosis of unspecified carotid artery: Secondary | ICD-10-CM | POA: Insufficient documentation

## 2013-07-25 DIAGNOSIS — R55 Syncope and collapse: Secondary | ICD-10-CM | POA: Insufficient documentation

## 2013-07-25 DIAGNOSIS — Z8673 Personal history of transient ischemic attack (TIA), and cerebral infarction without residual deficits: Secondary | ICD-10-CM | POA: Insufficient documentation

## 2013-07-25 DIAGNOSIS — Z8249 Family history of ischemic heart disease and other diseases of the circulatory system: Secondary | ICD-10-CM | POA: Insufficient documentation

## 2013-07-25 DIAGNOSIS — I10 Essential (primary) hypertension: Secondary | ICD-10-CM | POA: Insufficient documentation

## 2013-07-25 DIAGNOSIS — Z951 Presence of aortocoronary bypass graft: Secondary | ICD-10-CM

## 2013-07-25 DIAGNOSIS — R0989 Other specified symptoms and signs involving the circulatory and respiratory systems: Secondary | ICD-10-CM | POA: Insufficient documentation

## 2013-07-25 DIAGNOSIS — Z794 Long term (current) use of insulin: Secondary | ICD-10-CM | POA: Insufficient documentation

## 2013-07-25 DIAGNOSIS — R0609 Other forms of dyspnea: Secondary | ICD-10-CM | POA: Insufficient documentation

## 2013-07-25 DIAGNOSIS — E119 Type 2 diabetes mellitus without complications: Secondary | ICD-10-CM | POA: Insufficient documentation

## 2013-07-25 DIAGNOSIS — I2510 Atherosclerotic heart disease of native coronary artery without angina pectoris: Secondary | ICD-10-CM

## 2013-07-25 MED ORDER — TECHNETIUM TC 99M SESTAMIBI GENERIC - CARDIOLITE
33.0000 | Freq: Once | INTRAVENOUS | Status: AC | PRN
  Administered 2013-07-25: 33 via INTRAVENOUS

## 2013-07-25 MED ORDER — TECHNETIUM TC 99M SESTAMIBI GENERIC - CARDIOLITE
11.0000 | Freq: Once | INTRAVENOUS | Status: AC | PRN
  Administered 2013-07-25: 11 via INTRAVENOUS

## 2013-07-25 MED ORDER — REGADENOSON 0.4 MG/5ML IV SOLN
0.4000 mg | Freq: Once | INTRAVENOUS | Status: AC
  Administered 2013-07-25: 0.4 mg via INTRAVENOUS

## 2013-07-25 NOTE — Progress Notes (Signed)
[facility] SITE 3 NUCLEAR MED [place], [place] [address] [phone]    Cardiology Nuclear Med Study  [name] is a 79 y.o. female     MRN : [id number]     DOB: 12-09-1930  Procedure Date: 07/25/2013  Nuclear Med Background Indication for Stress Test:  Evaluation for Ischemia, Graft Patency and Stent Patency History:  '90's PTCA of OM/Stent of RCA;'03 MPS:EF=66%,normal;'06 Heart Catheterization: EF=60%,severe 3 vessel disease>CABG;'11 Echo:EF=75%,mild-moderate LVH Cardiac Risk Factors: Carotid Disease, CVA, Family History - CAD, Hypertension, IDDM Type 2, Lipids and TIA  Symptoms:  Dizziness, DOE and Syncope   Nuclear Pre-Procedure Caffeine/Decaff Intake:  None > 12hrs NPO After: 6:30pm   Lungs:  clear O2 Sat: 96% on room air. IV 0.9% NS with Angio Cath:  22g  IV Site: R Antecubital x 1, tolerated well IV Started by:  Irven Baltimore, RN  Chest Size (in):  42 Cup Size: DD  Height: 5\' 6"  (1.676 m)  Weight:  201 lb (91.173 kg)  BMI:  Body mass index is 32.46 kg/(m^2). Tech Comments:  No insulin last night or this am; fasting CBG  was 97 on arrival at 10:30 am. Irven Baltimore, RN    Nuclear Med Study 1 or 2 day study: 1 day  Stress Test Type:  Carlton Adam  Reading MD: Darlin Coco, MD  Order Authorizing Provider:  Minus Breeding, MD  Resting Radionuclide: Technetium 58m Sestamibi  Resting Radionuclide Dose: 11.0 mCi   Stress Radionuclide:  Technetium 61m Sestamibi  Stress Radionuclide Dose: 33.0 mCi           Stress Protocol Rest HR: 54 Stress HR: 71  Rest BP: 144/67 Stress BP: 174/74  Exercise Time (min): n/a METS: n/a   Predicted Max HR: 141 bpm % Max HR: 50.35 bpm Rate Pressure Product: 12354   Dose of Adenosine (mg):  n/a Dose of Lexiscan: 0.4 mg  Dose of Atropine (mg): n/a Dose of Dobutamine: n/a mcg/kg/min (at max HR)  Stress Test Technologist: Matilde Haymaker, RN  Nuclear Technologist:  Charlton Amor, CNMT     Rest  Procedure:  Myocardial perfusion imaging was performed at rest 45 minutes following the intravenous administration of Technetium 79m Sestamibi. Rest ECG: NSR with non-specific ST-T wave changes  Stress Procedure:  The patient received IV Lexiscan 0.4 mg over 15-seconds.  Technetium 4m Sestamibi injected at 30-seconds.  Patient had chest tightness 8/10 in recovery. Relieved late in recovery.Quantitative spect images were obtained after a 45 minute delay. Stress ECG: No significant change from baseline ECG  QPS Raw Data Images:  Normal; no motion artifact; normal heart/lung ratio. Stress Images:  Normal homogeneous uptake in all areas of the myocardium. Rest Images:  Normal homogeneous uptake in all areas of the myocardium. Subtraction (SDS):  No evidence of ischemia. Transient Ischemic Dilatation (Normal <1.22):  NA Lung/Heart Ratio (Normal <0.45):  0.32  Quantitative Gated Spect Images QGS EDV:  90 ml QGS ESV:  38 ml  Impression Exercise Capacity:  Lexiscan with no exercise. BP Response:  Normal blood pressure response. Clinical Symptoms:  Significant chest pain. ECG Impression:  No significant ECG changes with Lexiscan. Comparison with Prior Nuclear Study: No images to compare  Overall Impression:  Normal stress nuclear study.  LV Ejection Fraction: 58%.  LV Wall Motion:  NL LV Function; NL Wall Motion  [employer]

## 2013-07-28 ENCOUNTER — Ambulatory Visit: Payer: Medicare Other | Admitting: Urology

## 2013-07-31 ENCOUNTER — Encounter (HOSPITAL_COMMUNITY): Payer: Medicare Other

## 2013-08-09 ENCOUNTER — Other Ambulatory Visit: Payer: Self-pay | Admitting: *Deleted

## 2013-08-09 MED ORDER — LORAZEPAM 0.5 MG PO TABS: ORAL_TABLET | ORAL | Status: DC

## 2013-08-09 NOTE — Telephone Encounter (Signed)
Please inform script is ready. thx

## 2013-08-09 NOTE — Telephone Encounter (Signed)
LAST RF 07/10/13. CALL IN [place] [phone]. LAST OV 05/29/13.

## 2013-08-11 NOTE — Telephone Encounter (Signed)
Script called to [name]'s and left message for pt.

## 2013-08-29 ENCOUNTER — Encounter: Payer: Self-pay | Admitting: General Practice

## 2013-08-29 ENCOUNTER — Ambulatory Visit (INDEPENDENT_AMBULATORY_CARE_PROVIDER_SITE_OTHER): Payer: Medicare Other | Admitting: General Practice

## 2013-08-29 VITALS — BP 143/66 | HR 56 | Temp 97.0°F | Ht 66.0 in | Wt 203.0 lb

## 2013-08-29 DIAGNOSIS — E119 Type 2 diabetes mellitus without complications: Secondary | ICD-10-CM

## 2013-08-29 DIAGNOSIS — I10 Essential (primary) hypertension: Secondary | ICD-10-CM

## 2013-08-29 DIAGNOSIS — Z09 Encounter for follow-up examination after completed treatment for conditions other than malignant neoplasm: Secondary | ICD-10-CM

## 2013-08-29 LAB — POCT CBC
Granulocyte percent: 72.7 %G (ref 37–80)
HCT, POC: 36.2 % — AB (ref 37.7–47.9)
Hemoglobin: 12.2 g/dL (ref 12.2–16.2)
MCHC: 33.6 g/dL (ref 31.8–35.4)
MPV: 8.5 fL (ref 0–99.8)
POC Granulocyte: 4.7 (ref 2–6.9)
POC LYMPH PERCENT: 23.3 %L (ref 10–50)

## 2013-08-29 LAB — POCT GLYCOSYLATED HEMOGLOBIN (HGB A1C): Hemoglobin A1C: 7.9

## 2013-08-29 MED ORDER — CLOPIDOGREL BISULFATE 75 MG PO TABS: 75.0000 mg | ORAL_TABLET | Freq: Every day | ORAL | Status: DC

## 2013-08-29 MED ORDER — FUROSEMIDE 20 MG PO TABS: 20.0000 mg | ORAL_TABLET | Freq: Every day | ORAL | Status: DC

## 2013-08-29 MED ORDER — AMLODIPINE BESYLATE 5 MG PO TABS: 5.0000 mg | ORAL_TABLET | Freq: Every day | ORAL | Status: DC

## 2013-08-29 MED ORDER — INSULIN NPH (HUMAN) (ISOPHANE) 100 UNIT/ML ~~LOC~~ SUSP: 92.0000 [IU] | Freq: Every day | SUBCUTANEOUS | Status: DC

## 2013-08-29 MED ORDER — ESOMEPRAZOLE MAGNESIUM 40 MG PO CPDR: 40.0000 mg | DELAYED_RELEASE_CAPSULE | Freq: Every day | ORAL | Status: DC

## 2013-08-29 MED ORDER — LORAZEPAM 0.5 MG PO TABS: ORAL_TABLET | ORAL | Status: DC

## 2013-08-29 MED ORDER — PAROXETINE HCL 10 MG PO TABS: 10.0000 mg | ORAL_TABLET | Freq: Every day | ORAL | Status: DC

## 2013-08-29 MED ORDER — ATORVASTATIN CALCIUM 20 MG PO TABS: 20.0000 mg | ORAL_TABLET | Freq: Every day | ORAL | Status: DC

## 2013-08-29 MED ORDER — MECLIZINE HCL 25 MG PO TABS: 25.0000 mg | ORAL_TABLET | Freq: Two times a day (BID) | ORAL | Status: DC | PRN

## 2013-08-29 NOTE — Patient Instructions (Addendum)

## 2013-08-29 NOTE — Progress Notes (Signed)
  Subjective:    [name], female    DOB: 1931-03-31, 79 y.o.   MRN: [id number]  HPI Presents today for follow up of chronic health conditions. She has history of hypertension, hyperlipidemia, gerd, IDDM, GAD, and depression. She reports taking medications as prescribed. She reports checking blood sugars once to twice weekly. Reports checking only when suspected to be high or low. She reports medication for anxiety and depression are effective. Reports she is only taking antianxiety medication, ativan twice daily. Denies mood swings. Reports she is scheduled for a mammogram on next Monday. Reports eating a healthy diet.      Review of Systems  Constitutional: Negative for fever and chills.  HENT: Negative for neck pain and neck stiffness.   Respiratory: Negative for chest tightness and shortness of breath.   Cardiovascular: Negative for chest pain and palpitations.  Gastrointestinal: Negative for vomiting, abdominal pain, diarrhea and blood in stool.  Genitourinary: Negative for dysuria, hematuria and difficulty urinating.  Neurological: Negative for dizziness, weakness, numbness and headaches.       Objective:   Physical Exam  Constitutional: She is oriented to person, place, and time. She appears well-developed and well-nourished.  HENT:  Head: Normocephalic and atraumatic.  Right Ear: External ear normal.  Left Ear: External ear normal.  Eyes: EOM are normal.  Neck: Normal range of motion. Neck supple.  Cardiovascular: Normal rate, regular rhythm and normal heart sounds.   Pulmonary/Chest: Effort normal and breath sounds normal.  Abdominal: Soft. Bowel sounds are normal.  Neurological: She is alert and oriented to person, place, and time.  Skin: Skin is warm and dry.  Psychiatric: She has a normal mood and affect.          Assessment & Plan:  1. Diabetes - POCT glycosylated hemoglobin (Hb A1C)  2. Hypertension - CMP14+EGFR  3. Follow-up exam, 3-6  months since previous exam - POCT CBC  Continue all current medications Labs pending F/u in 3 months Discussed exercise and diet  Patient verbalized understanding Erby Pian, FNP-C

## 2013-08-30 LAB — CMP14+EGFR
ALT: 8 IU/L (ref 0–32)
AST: 13 IU/L (ref 0–40)
Albumin/Globulin Ratio: 1.8 (ref 1.1–2.5)
CO2: 27 mmol/L (ref 18–29)
Calcium: 10 mg/dL (ref 8.6–10.2)
Creatinine, Ser: 1.13 mg/dL — ABNORMAL HIGH (ref 0.57–1.00)
GFR calc non Af Amer: 46 mL/min/{1.73_m2} — ABNORMAL LOW (ref >59)
Globulin, Total: 2.4 g/dL (ref 1.5–4.5)
Glucose: 180 mg/dL — ABNORMAL HIGH (ref 65–99)
Potassium: 5 mmol/L (ref 3.5–5.2)
Sodium: 139 mmol/L (ref 134–144)
Total Protein: 6.6 g/dL (ref 6.0–8.5)

## 2013-09-05 ENCOUNTER — Other Ambulatory Visit: Payer: Self-pay | Admitting: General Practice

## 2013-09-06 ENCOUNTER — Other Ambulatory Visit: Payer: Self-pay | Admitting: Nurse Practitioner

## 2013-09-08 ENCOUNTER — Other Ambulatory Visit: Payer: Self-pay | Admitting: *Deleted

## 2013-09-08 MED ORDER — LORAZEPAM 0.5 MG PO TABS: ORAL_TABLET | ORAL | Status: DC

## 2013-09-08 NOTE — Telephone Encounter (Signed)
Please call into pharmacy

## 2013-09-08 NOTE — Telephone Encounter (Signed)
Last seen 08-29-13, notes say Rx was printed then, but pharmacy says last fill was 08/11/13. If approved route to pool B so it can be called into [employer]

## 2013-09-11 NOTE — Telephone Encounter (Signed)
Called into [facility]

## 2013-09-12 ENCOUNTER — Ambulatory Visit (INDEPENDENT_AMBULATORY_CARE_PROVIDER_SITE_OTHER): Payer: Medicare Other | Admitting: General Practice

## 2013-09-12 VITALS — BP 139/65 | HR 56 | Temp 98.1°F | Ht 66.0 in | Wt 204.0 lb

## 2013-09-12 DIAGNOSIS — L0291 Cutaneous abscess, unspecified: Secondary | ICD-10-CM

## 2013-09-12 MED ORDER — SULFAMETHOXAZOLE-TMP DS 800-160 MG PO TABS: 1.0000 | ORAL_TABLET | Freq: Two times a day (BID) | ORAL | Status: DC

## 2013-09-12 NOTE — Progress Notes (Signed)
  Subjective:    Patient ID: [name], female    DOB: June 05, 1931, 79 y.o.   MRN: [id number]  HPI Patient presents today with complaints of red, sore, bump on back. Her daughter reports a small amount of drainage noted.     Review of Systems  Constitutional: Negative for fever and chills.  Respiratory: Negative for chest tightness and shortness of breath.   Cardiovascular: Negative for chest pain and palpitations.  Skin:       Red, tender, bump to lower mid back  Neurological: Negative for dizziness, weakness and headaches.       Objective:   Physical Exam  Constitutional: She is oriented to person, place, and time. She appears well-developed and well-nourished.  Cardiovascular: Normal rate, regular rhythm and normal heart sounds.   Pulmonary/Chest: Effort normal and breath sounds normal.  Neurological: She is alert and oriented to person, place, and time.  Skin: Skin is warm and dry.  Abscess noted lower mid back, erythematous and warmth noted. 1/2 inch circumference.   Psychiatric: She has a normal mood and affect.          Assessment & Plan:  1. Cellulitis and abscess  - sulfamethoxazole-trimethoprim (BACTRIM DS) 800-160 MG per tablet; Take 1 tablet by mouth 2 (two) times daily.  Dispense: 20 tablet; Refill: 0 - Aerobic culture  2. Encounter for drainage of abscess -verbal consent obtained -cleansed area with betadine -topical anesthetic applied -local anesthetic, xylocaine 1% used, .5cc -cleansed again with sterile gauze and saline -small incision made with # 11 blade -expressed purulent drainage  -cleansed with normal saline -packed with 1/2 inch iodoform -covered with guaze and secured with tape -Monitor for sign of increased infection -RTO on Thursday -Patient verbalized understanding Erby Pian, FNP-C

## 2013-09-12 NOTE — Patient Instructions (Signed)
Abscess An abscess is an infected area that contains a collection of pus and debris.It can occur in almost any part of the body. An abscess is also known as a furuncle or boil. CAUSES  An abscess occurs when tissue gets infected. This can occur from blockage of oil or sweat glands, infection of hair follicles, or a minor injury to the skin. As the body tries to fight the infection, pus collects in the area and creates pressure under the skin. This pressure causes pain. People with weakened immune systems have difficulty fighting infections and get certain abscesses more often.  SYMPTOMS Usually an abscess develops on the skin and becomes a painful mass that is red, warm, and tender. If the abscess forms under the skin, you may feel a moveable soft area under the skin. Some abscesses break open (rupture) on their own, but most will continue to get worse without care. The infection can spread deeper into the body and eventually into the bloodstream, causing you to feel ill.  DIAGNOSIS  Your caregiver will take your medical history and perform a physical exam. A sample of fluid may also be taken from the abscess to determine what is causing your infection. TREATMENT  Your caregiver may prescribe antibiotic medicines to fight the infection. However, taking antibiotics alone usually does not cure an abscess. Your caregiver may need to make a small cut (incision) in the abscess to drain the pus. In some cases, gauze is packed into the abscess to reduce pain and to continue draining the area. HOME CARE INSTRUCTIONS   Only take over-the-counter or prescription medicines for pain, discomfort, or fever as directed by your caregiver.  If you were prescribed antibiotics, take them as directed. Finish them even if you start to feel better.  If gauze is used, follow your caregiver's directions for changing the gauze.  To avoid spreading the infection:  Keep your draining abscess covered with a  bandage.  Wash your hands well.  Do not share personal care items, towels, or whirlpools with others.  Avoid skin contact with others.  Keep your skin and clothes clean around the abscess.  Keep all follow-up appointments as directed by your caregiver. SEEK MEDICAL CARE IF:   You have increased pain, swelling, redness, fluid drainage, or bleeding.  You have muscle aches, chills, or a general ill feeling.  You have a fever. MAKE SURE YOU:   Understand these instructions.  Will watch your condition.  Will get help right away if you are not doing well or get worse. Document Released: 09/02/2005 Document Revised: 05/24/2012 Document Reviewed: 02/05/2012 [employer] Patient Information 2014 [employer].  

## 2013-09-14 ENCOUNTER — Encounter: Payer: Self-pay | Admitting: General Practice

## 2013-09-14 ENCOUNTER — Ambulatory Visit (INDEPENDENT_AMBULATORY_CARE_PROVIDER_SITE_OTHER): Payer: Medicare Other | Admitting: General Practice

## 2013-09-14 ENCOUNTER — Encounter (INDEPENDENT_AMBULATORY_CARE_PROVIDER_SITE_OTHER): Payer: Self-pay

## 2013-09-14 VITALS — BP 135/62 | HR 54 | Temp 97.4°F | Ht 66.0 in | Wt 202.0 lb

## 2013-09-14 DIAGNOSIS — L0291 Cutaneous abscess, unspecified: Secondary | ICD-10-CM

## 2013-09-14 DIAGNOSIS — Z4800 Encounter for change or removal of nonsurgical wound dressing: Secondary | ICD-10-CM

## 2013-09-14 LAB — AEROBIC CULTURE

## 2013-09-14 MED ORDER — CEPHALEXIN 500 MG PO CAPS: 500.0000 mg | ORAL_CAPSULE | Freq: Three times a day (TID) | ORAL | Status: DC

## 2013-09-14 NOTE — Progress Notes (Signed)
  Subjective:    [name], female    DOB: Jun 30, 1931, 79 y.o.   MRN: [id number]  HPI Patient presents today for dressing change to right mid back abscess. She reports taking antibiotics as prescribed and has two day left of septra. Denies drainage from site.     Review of Systems  Constitutional: Negative for fever and chills.  Respiratory: Negative for chest tightness and shortness of breath.   Cardiovascular: Negative for chest pain and palpitations.  Skin:       Red area to right mid back  Neurological: Negative for dizziness, weakness and headaches.       Objective:   Physical Exam  Constitutional: She is oriented to person, place, and time. She appears well-developed and well-nourished.  Cardiovascular: Normal rate, regular rhythm and normal heart sounds.   Pulmonary/Chest: Effort normal and breath sounds normal.  Neurological: She is alert and oriented to person, place, and time.  Skin: Skin is warm and dry.  Wound 1/4 inch deep, with mild erythema noted 1/4 inch diameter. Healthy red tissue. Negative drainage.   Psychiatric: She has a normal mood and affect.          Assessment & Plan:  1. Cellulitis and abscess  - cephALEXin (KEFLEX) 500 MG capsule; Take 1 capsule (500 mg total) by mouth 3 (three) times daily.  Dispense: 30 capsule; Refill: 0  2. Dressing change -cleansed with normal saline  -packed with 1/4 inch iodoform  -covered with large bandaid  -Monitor for sign of increased infection  -RTO on Saturday  -Patient verbalized understanding  Erby Pian, FNP-C

## 2013-09-16 ENCOUNTER — Encounter (INDEPENDENT_AMBULATORY_CARE_PROVIDER_SITE_OTHER): Payer: Self-pay

## 2013-09-16 ENCOUNTER — Telehealth: Payer: Self-pay | Admitting: General Practice

## 2013-09-16 ENCOUNTER — Ambulatory Visit (INDEPENDENT_AMBULATORY_CARE_PROVIDER_SITE_OTHER): Payer: Medicare Other | Admitting: General Practice

## 2013-09-16 VITALS — BP 138/64 | HR 59 | Temp 97.1°F | Wt 201.0 lb

## 2013-09-16 DIAGNOSIS — Z4800 Encounter for change or removal of nonsurgical wound dressing: Secondary | ICD-10-CM

## 2013-09-16 NOTE — Progress Notes (Signed)
  Subjective:    [name], female    DOB: 1931-04-28, 79 y.o.   MRN: [id number]  HPI Patient presents today for dressing change to right mid back. Reports taking antibiotics as prescribed. Denies problems or concerns.     Review of Systems  Constitutional: Negative for fever and chills.  Respiratory: Negative for chest tightness and shortness of breath.   Cardiovascular: Negative for chest pain and palpitations.  Skin:       Pink around area that was drained  Neurological: Negative for dizziness, weakness and headaches.       Objective:   Physical Exam  Constitutional: She is oriented to person, place, and time. She appears well-developed and well-nourished.  Cardiovascular: Normal rate, regular rhythm and normal heart sounds.   Pulmonary/Chest: Effort normal and breath sounds normal.  Neurological: She is alert and oriented to person, place, and time.  Skin: Skin is warm and dry.  Wound 1/8 inch deep, without mild erythema surrounding. Healthy red tissue. Negative drainage.   Psychiatric: She has a normal mood and affect.          Assessment & Plan:  1. Dressing change, 2. Change or removal of wound packing cleansed with normal saline  -packed with 1/8 inch iodoform   -covered with large bandaid  -Patient's daughter to change bandaid on Monday, also remove packing -Monitor for sign of increased infection, discussed  -RTO on Wednesday  -Patient and daughter verbalized understanding  Erby Pian, FNP-C

## 2013-09-20 ENCOUNTER — Ambulatory Visit (INDEPENDENT_AMBULATORY_CARE_PROVIDER_SITE_OTHER): Payer: Medicare Other | Admitting: General Practice

## 2013-09-20 ENCOUNTER — Encounter: Payer: Self-pay | Admitting: General Practice

## 2013-09-20 VITALS — BP 131/61 | HR 56 | Temp 97.9°F

## 2013-09-20 DIAGNOSIS — L0291 Cutaneous abscess, unspecified: Secondary | ICD-10-CM

## 2013-09-20 NOTE — Progress Notes (Signed)
  Subjective:    [name], female    DOB: 1931-04-01, 79 y.o.   MRN: [id number]  HPI Patient presents today for recheck of wound. Patient denies drainage, tenderness or redness. Reports taking medications as prescribed.     Review of Systems  Constitutional: Negative for fever and chills.  Respiratory: Negative for chest tightness and shortness of breath.   Cardiovascular: Negative for chest pain.  Neurological: Negative for dizziness, weakness and headaches.       Objective:   Physical Exam  Constitutional: She is oriented to person, place, and time. She appears well-developed and well-nourished.  Cardiovascular: Regular rhythm and normal heart sounds.   Pulmonary/Chest: Effort normal and breath sounds normal.  Neurological: She is alert and oriented to person, place, and time.  Skin: Skin is warm and dry.  Mid right back wound healed.   Psychiatric: She has a normal mood and affect.          Assessment & Plan:  1. Abscess -wound healed -RTO if symptoms develop -Patient and daughter verbalized understanding Erby Pian, FNP-C

## 2013-09-29 ENCOUNTER — Encounter (INDEPENDENT_AMBULATORY_CARE_PROVIDER_SITE_OTHER): Payer: Self-pay

## 2013-09-29 ENCOUNTER — Ambulatory Visit (INDEPENDENT_AMBULATORY_CARE_PROVIDER_SITE_OTHER): Payer: Medicare Other | Admitting: Urology

## 2013-09-29 DIAGNOSIS — N3946 Mixed incontinence: Secondary | ICD-10-CM

## 2013-09-29 DIAGNOSIS — R339 Retention of urine, unspecified: Secondary | ICD-10-CM

## 2013-09-29 DIAGNOSIS — Z87440 Personal history of urinary (tract) infections: Secondary | ICD-10-CM

## 2013-10-03 ENCOUNTER — Other Ambulatory Visit: Payer: Self-pay | Admitting: Nurse Practitioner

## 2013-10-10 ENCOUNTER — Other Ambulatory Visit: Payer: Self-pay

## 2013-10-10 NOTE — Telephone Encounter (Signed)
Last seen 09/20/13  [name]  If approved route to nurse to phone into [place]  [phone]

## 2013-10-11 ENCOUNTER — Other Ambulatory Visit: Payer: Self-pay

## 2013-10-11 NOTE — Telephone Encounter (Signed)
Last seen 09/20/13  Mae   If approved route to nurse to phone into [facility]  [phone]

## 2013-10-12 MED ORDER — LORAZEPAM 0.5 MG PO TABS: ORAL_TABLET | ORAL | Status: DC

## 2013-10-12 NOTE — Telephone Encounter (Signed)
Please inform script ready

## 2013-10-13 NOTE — Telephone Encounter (Signed)
Called in.

## 2013-11-09 ENCOUNTER — Other Ambulatory Visit: Payer: Self-pay | Admitting: *Deleted

## 2013-11-09 NOTE — Telephone Encounter (Signed)
Patient last seen in office on 10-15 for an acute visit. Last chronic follow up was on 9-23. Please advise. If approved please route to Pool B so nurse can phone in to [place]. [phone]

## 2013-11-10 MED ORDER — LORAZEPAM 0.5 MG PO TABS: ORAL_TABLET | ORAL | Status: DC

## 2013-11-28 ENCOUNTER — Ambulatory Visit: Payer: Medicare Other | Admitting: General Practice

## 2013-12-02 ENCOUNTER — Other Ambulatory Visit: Payer: Self-pay | Admitting: Nurse Practitioner

## 2013-12-02 ENCOUNTER — Other Ambulatory Visit: Payer: Self-pay | Admitting: General Practice

## 2013-12-04 NOTE — Telephone Encounter (Signed)
Last seen 09/20/13  [name]  Last lipid 05/29/13

## 2013-12-11 ENCOUNTER — Ambulatory Visit (INDEPENDENT_AMBULATORY_CARE_PROVIDER_SITE_OTHER): Payer: Medicare Other | Admitting: General Practice

## 2013-12-11 ENCOUNTER — Encounter: Payer: Self-pay | Admitting: General Practice

## 2013-12-11 VITALS — BP 141/71 | HR 58 | Temp 98.1°F | Wt 198.0 lb

## 2013-12-11 DIAGNOSIS — K219 Gastro-esophageal reflux disease without esophagitis: Secondary | ICD-10-CM

## 2013-12-11 DIAGNOSIS — R7989 Other specified abnormal findings of blood chemistry: Secondary | ICD-10-CM

## 2013-12-11 DIAGNOSIS — I10 Essential (primary) hypertension: Secondary | ICD-10-CM

## 2013-12-11 DIAGNOSIS — E785 Hyperlipidemia, unspecified: Secondary | ICD-10-CM

## 2013-12-11 DIAGNOSIS — F329 Major depressive disorder, single episode, unspecified: Secondary | ICD-10-CM

## 2013-12-11 DIAGNOSIS — I2510 Atherosclerotic heart disease of native coronary artery without angina pectoris: Secondary | ICD-10-CM

## 2013-12-11 DIAGNOSIS — F411 Generalized anxiety disorder: Secondary | ICD-10-CM

## 2013-12-11 DIAGNOSIS — F3289 Other specified depressive episodes: Secondary | ICD-10-CM

## 2013-12-11 DIAGNOSIS — F32A Depression, unspecified: Secondary | ICD-10-CM

## 2013-12-11 DIAGNOSIS — E119 Type 2 diabetes mellitus without complications: Secondary | ICD-10-CM

## 2013-12-11 LAB — POCT CBC
GRANULOCYTE PERCENT: 70 % (ref 37–80)
HCT, POC: 40.8 % (ref 37.7–47.9)
Hemoglobin: 12.6 g/dL (ref 12.2–16.2)
Lymph, poc: 1.7 (ref 0.6–3.4)
MCH: 27.2 pg (ref 27–31.2)
MCHC: 30.9 g/dL — AB (ref 31.8–35.4)
MCV: 88.1 fL (ref 80–97)
MPV: 9.5 fL (ref 0–99.8)
POC Granulocyte: 4.3 (ref 2–6.9)
POC LYMPH %: 27.7 % (ref 10–50)
Platelet Count, POC: 177 10*3/uL (ref 142–424)
RBC: 4.6 M/uL (ref 4.04–5.48)
RDW, POC: 14 %
WBC: 6.2 10*3/uL (ref 4.6–10.2)

## 2013-12-11 LAB — POCT GLYCOSYLATED HEMOGLOBIN (HGB A1C): Hemoglobin A1C: 8.8

## 2013-12-11 MED ORDER — FUROSEMIDE 20 MG PO TABS: 20.0000 mg | ORAL_TABLET | Freq: Every day | ORAL | Status: DC

## 2013-12-11 MED ORDER — PAROXETINE HCL 10 MG PO TABS: 10.0000 mg | ORAL_TABLET | Freq: Every day | ORAL | Status: DC

## 2013-12-11 MED ORDER — INSULIN NPH (HUMAN) (ISOPHANE) 100 UNIT/ML ~~LOC~~ SUSP: 92.0000 [IU] | Freq: Every day | SUBCUTANEOUS | Status: DC

## 2013-12-11 MED ORDER — ESOMEPRAZOLE MAGNESIUM 40 MG PO CPDR: DELAYED_RELEASE_CAPSULE | ORAL | Status: DC

## 2013-12-11 MED ORDER — ATORVASTATIN CALCIUM 20 MG PO TABS: 20.0000 mg | ORAL_TABLET | Freq: Every day | ORAL | Status: DC

## 2013-12-11 MED ORDER — AMLODIPINE BESYLATE 5 MG PO TABS: ORAL_TABLET | ORAL | Status: DC

## 2013-12-11 MED ORDER — LISINOPRIL 5 MG PO TABS: ORAL_TABLET | ORAL | Status: DC

## 2013-12-11 MED ORDER — CLOPIDOGREL BISULFATE 75 MG PO TABS: 75.0000 mg | ORAL_TABLET | Freq: Every day | ORAL | Status: DC

## 2013-12-11 MED ORDER — METOPROLOL TARTRATE 50 MG PO TABS: 50.0000 mg | ORAL_TABLET | Freq: Two times a day (BID) | ORAL | Status: DC

## 2013-12-11 MED ORDER — LORAZEPAM 0.5 MG PO TABS: ORAL_TABLET | ORAL | Status: DC

## 2013-12-11 NOTE — Progress Notes (Signed)
Subjective:    [name], female    DOB: 01-01-1931, 80 y.o.   MRN: [id number]  HPI Presents today for follow up of chronic health conditions. History of hypertension, hyperlipidemia, gerd, IDDM, GAD, and depression. She reports taking medications as prescribed. She reports checking blood sugars once to twice weekly. Reports medication for anxiety and depression are effective. Denies mood swings, thought of harming self or others. Eating a healthy diet.      Review of Systems  Constitutional: Negative for fever and chills.  Respiratory: Negative for chest tightness and shortness of breath.   Cardiovascular: Negative for chest pain and palpitations.  Gastrointestinal: Negative for vomiting, abdominal pain, diarrhea and blood in stool.  Genitourinary: Negative for dysuria, hematuria and difficulty urinating.  Musculoskeletal: Negative for neck pain and neck stiffness.  Neurological: Negative for dizziness, weakness, numbness and headaches.       Objective:   Physical Exam  Constitutional: She is oriented to person, place, and time. She appears well-developed and well-nourished.  HENT:  Head: Normocephalic and atraumatic.  Right Ear: External ear normal.  Left Ear: External ear normal.  Eyes: EOM are normal.  Neck: Normal range of motion. Neck supple.  Cardiovascular: Normal rate, regular rhythm and normal heart sounds.   Pulmonary/Chest: Effort normal and breath sounds normal.  Abdominal: Soft. Bowel sounds are normal.  Neurological: She is alert and oriented to person, place, and time.  Skin: Skin is warm and dry.  Psychiatric: She has a normal mood and affect.          Assessment & Plan:  1. Other abnormal blood chemistry  - POCT CBC  2. Type II or unspecified type diabetes mellitus without mention of complication, not stated as uncontrolled  - POCT glycosylated hemoglobin (Hb A1C) - insulin NPH Human (HUMULIN N) 100 UNIT/ML injection; Inject 92  Units into the skin daily before breakfast.  Dispense: 40 mL; Refill: 5  3. Essential hypertension, benign  - CMP14+EGFR - metoprolol (LOPRESSOR) 50 MG tablet; Take 1 tablet (50 mg total) by mouth 2 (two) times daily.  Dispense: 60 tablet; Refill: 11 - lisinopril (PRINIVIL,ZESTRIL) 5 MG tablet; TAKE 1 TABLET ONCE DAILY.  Dispense: 30 tablet; Refill: 3 - furosemide (LASIX) 20 MG tablet; Take 1 tablet (20 mg total) by mouth daily.  Dispense: 30 tablet; Refill: 3 - amLODipine (NORVASC) 5 MG tablet; TAKE 1 & 1/2 TABLETS BY MOUTH ONCE DAILY.  Dispense: 45 tablet; Refill: 3  4. Other and unspecified hyperlipidemia  - NMR, lipoprofile  5. Unspecified essential hypertension   6. Depression  - PARoxetine (PAXIL) 10 MG tablet; Take 1 tablet (10 mg total) by mouth daily.  Dispense: 30 tablet; Refill: 3  7. GERD (gastroesophageal reflux disease)  - esomeprazole (NEXIUM) 40 MG capsule; TAKE 1 CAPSULE ONCE DAILY FOR ACID REFLUX.  Dispense: 30 capsule; Refill: 3  8. GAD (generalized anxiety disorder)  - LORazepam (ATIVAN) 0.5 MG tablet; TAKE 1 TABLET BY MOUTH 2 TIMES DAILY, as needed  Dispense: 60 tablet; Refill: 0  9. Hyperlipidemia  - atorvastatin (LIPITOR) 20 MG tablet; Take 1 tablet (20 mg total) by mouth daily.  Dispense: 30 tablet; Refill: 3  10. CAD (coronary artery disease)  - clopidogrel (PLAVIX) 75 MG tablet; Take 1 tablet (75 mg total) by mouth daily.  Dispense: 30 tablet; Refill: 3 - atorvastatin (LIPITOR) 20 MG tablet; Take 1 tablet (20 mg total) by mouth daily.  Dispense: 30 tablet; Refill: 3 -Continue all current medications  Labs pending F/u in 3 months Discussed benefits of regular exercise and healthy eating Patient verbalized understanding Erby Pian, FNP-C

## 2013-12-11 NOTE — Patient Instructions (Signed)

## 2013-12-12 LAB — SPECIMEN STATUS REPORT

## 2013-12-12 LAB — CMP14+EGFR
ALBUMIN: 4.3 g/dL (ref 3.5–4.7)
ALT: 11 IU/L (ref 0–32)
AST: 15 IU/L (ref 0–40)
Albumin/Globulin Ratio: 1.9 (ref 1.1–2.5)
Alkaline Phosphatase: 60 IU/L (ref 39–117)
BUN/Creatinine Ratio: 18 (ref 11–26)
BUN: 25 mg/dL (ref 8–27)
CO2: 22 mmol/L (ref 18–29)
CREATININE: 1.38 mg/dL — AB (ref 0.57–1.00)
Calcium: 10.3 mg/dL — ABNORMAL HIGH (ref 8.6–10.2)
Chloride: 97 mmol/L (ref 97–108)
GFR calc Af Amer: 42 mL/min/{1.73_m2} — ABNORMAL LOW (ref >59)
GFR calc non Af Amer: 36 mL/min/{1.73_m2} — ABNORMAL LOW (ref >59)
GLOBULIN, TOTAL: 2.3 g/dL (ref 1.5–4.5)
GLUCOSE: 275 mg/dL — AB (ref 65–99)
Potassium: 5.4 mmol/L — ABNORMAL HIGH (ref 3.5–5.2)
Sodium: 137 mmol/L (ref 134–144)
TOTAL PROTEIN: 6.6 g/dL (ref 6.0–8.5)
Total Bilirubin: 0.3 mg/dL (ref 0.0–1.2)

## 2013-12-12 LAB — NMR, LIPOPROFILE
Cholesterol: 177 mg/dL (ref <200)
HDL Cholesterol by NMR: 53 mg/dL (ref >=40)
HDL Particle Number: 39.4 umol/L (ref >=30.5)
LDL PARTICLE NUMBER: 1278 nmol/L — AB (ref <1000)
LDL Size: 19.5 nm — ABNORMAL LOW (ref >20.5)
LDLC SERPL CALC-MCNC: 78 mg/dL (ref <100)
LP-IR SCORE: 56 — AB (ref <=45)
Small LDL Particle Number: 1059 nmol/L — ABNORMAL HIGH (ref <=527)
Triglycerides by NMR: 231 mg/dL — ABNORMAL HIGH (ref <150)

## 2013-12-28 NOTE — Telephone Encounter (Signed)
Patient's appointment was made.

## 2013-12-29 ENCOUNTER — Ambulatory Visit (INDEPENDENT_AMBULATORY_CARE_PROVIDER_SITE_OTHER): Payer: Medicare Other | Admitting: Urology

## 2013-12-29 DIAGNOSIS — N390 Urinary tract infection, site not specified: Secondary | ICD-10-CM

## 2013-12-29 DIAGNOSIS — N3946 Mixed incontinence: Secondary | ICD-10-CM

## 2014-01-01 DIAGNOSIS — Z0289 Encounter for other administrative examinations: Secondary | ICD-10-CM

## 2014-01-10 ENCOUNTER — Other Ambulatory Visit: Payer: Self-pay | Admitting: *Deleted

## 2014-01-10 DIAGNOSIS — F411 Generalized anxiety disorder: Secondary | ICD-10-CM

## 2014-01-10 NOTE — Telephone Encounter (Signed)
Patient last seen in office on 12-11-13. Rx last filled on 12-11-13 for #[id number]. Please advise. If approved please route to Pool B so nurse can phone in to [facility] [phone]

## 2014-01-11 ENCOUNTER — Other Ambulatory Visit: Payer: Self-pay | Admitting: *Deleted

## 2014-01-12 MED ORDER — LORAZEPAM 0.5 MG PO TABS: ORAL_TABLET | ORAL | Status: DC

## 2014-01-12 NOTE — Telephone Encounter (Signed)
Please phone in

## 2014-01-12 NOTE — Telephone Encounter (Signed)
Called to [facility]

## 2014-02-02 ENCOUNTER — Ambulatory Visit: Payer: Medicare Other | Admitting: Urology

## 2014-02-07 ENCOUNTER — Other Ambulatory Visit: Payer: Self-pay

## 2014-02-07 DIAGNOSIS — F411 Generalized anxiety disorder: Secondary | ICD-10-CM

## 2014-02-07 NOTE — Telephone Encounter (Signed)
Last seen 12/11/13  Mae  If approved route to nurse to call into [facility]  [phone]

## 2014-02-09 ENCOUNTER — Other Ambulatory Visit: Payer: Self-pay | Admitting: *Deleted

## 2014-02-09 ENCOUNTER — Other Ambulatory Visit: Payer: Self-pay | Admitting: General Practice

## 2014-02-09 DIAGNOSIS — F411 Generalized anxiety disorder: Secondary | ICD-10-CM

## 2014-02-09 MED ORDER — LORAZEPAM 0.5 MG PO TABS: ORAL_TABLET | ORAL | Status: DC

## 2014-02-09 NOTE — Telephone Encounter (Signed)
Called in.

## 2014-02-09 NOTE — Telephone Encounter (Signed)
Please call into pharmacy

## 2014-02-09 NOTE — Progress Notes (Signed)
Called in.

## 2014-02-16 ENCOUNTER — Encounter: Payer: Self-pay | Admitting: Physician Assistant

## 2014-02-16 ENCOUNTER — Ambulatory Visit (INDEPENDENT_AMBULATORY_CARE_PROVIDER_SITE_OTHER): Payer: Medicare Other

## 2014-02-16 ENCOUNTER — Ambulatory Visit (INDEPENDENT_AMBULATORY_CARE_PROVIDER_SITE_OTHER): Payer: Medicare Other | Admitting: Physician Assistant

## 2014-02-16 VITALS — BP 136/53 | HR 64 | Temp 98.8°F | Ht 66.0 in | Wt 194.2 lb

## 2014-02-16 DIAGNOSIS — R059 Cough, unspecified: Secondary | ICD-10-CM

## 2014-02-16 DIAGNOSIS — R05 Cough: Secondary | ICD-10-CM

## 2014-02-16 MED ORDER — ALBUTEROL SULFATE HFA 108 (90 BASE) MCG/ACT IN AERS: 2.0000 | INHALATION_SPRAY | Freq: Four times a day (QID) | RESPIRATORY_TRACT | Status: DC | PRN

## 2014-02-16 MED ORDER — AZITHROMYCIN 250 MG PO TABS: ORAL_TABLET | ORAL | Status: DC

## 2014-02-16 NOTE — Patient Instructions (Signed)
Use inhaler every 6 hours. Take PLAIN MUCINEX  As directed with plenty of non caffeinated beverages. RTC in 2 weeks or sooner if needed.

## 2014-02-16 NOTE — Progress Notes (Signed)
   Subjective:    [name], female    DOB: 02-May-1931, 80 y.o.   MRN: [id number]  HPI 80 y/o female with comorbid heart failure, CAD, HTN, DM presents with cough x 2 days. Has tried OTC Aleve with no relief. Worse with lying down.     Review of Systems  Constitutional: Negative for fever, chills, diaphoresis, fatigue and unexpected weight change.  HENT: Positive for congestion (nasal), postnasal drip (chronic seasonal allergy symptom), rhinorrhea, sinus pressure and sneezing. Negative for dental problem, ear discharge, ear pain, facial swelling, sore throat and trouble swallowing.   Respiratory: Positive for cough (nonproductive, worse with lying down) and shortness of breath (s/p coughing episodes). Negative for apnea, wheezing and stridor.   Cardiovascular: Positive for chest pain (pleuritic CP in LUL anteriorly) and leg swelling (chronic, non pitting edema). Negative for palpitations.  Neurological: Positive for light-headedness (with coughing episodes).       Objective:   Physical Exam  Nursing note and vitals reviewed. Constitutional: She is oriented to person, place, and time. She appears well-developed and well-nourished.  HENT:  Head: Normocephalic and atraumatic.  Patient has had past strokes which is evident in speech and facial movement. According to patient's daughter, this is baseline for patient.   Cardiovascular: Normal rate, regular rhythm and normal heart sounds.  Exam reveals no gallop and no friction rub.   No murmur heard. Pulmonary/Chest: No respiratory distress. She has no wheezes. She exhibits tenderness (anterior L upper lobe TTP).  Decreased BS bilaterally with mild decreased effort from patient. Cough with attempted deep inspiration.   Xray negative for abnormalities  Neurological: She is alert and oriented to person, place, and time.  Psychiatric: She has a normal mood and affect. Her behavior is normal.          Assessment & Plan:    1. Bronchitis: Tx w/ Albuterol inhaler q 6 hrs to prevent coughing episodes. Mucinex OTC for mucus. Empirically tx w/ azithromycin 250 mg , 2 tabs day 1, 1 tab day 2-5 due to patient's comorbidities and risk for increased infection.  2. Costochondritis: I feel that chest wall pain is secondary to coughing episodes and after discussion with patient and her daughter they agree. Rest, ice and heat are advised.   RTC in 2 wks for reassessment.

## 2014-03-02 ENCOUNTER — Ambulatory Visit: Payer: Medicare Other | Admitting: Physician Assistant

## 2014-03-13 ENCOUNTER — Telehealth: Payer: Self-pay | Admitting: *Deleted

## 2014-03-13 ENCOUNTER — Encounter: Payer: Self-pay | Admitting: General Practice

## 2014-03-13 ENCOUNTER — Ambulatory Visit (INDEPENDENT_AMBULATORY_CARE_PROVIDER_SITE_OTHER): Payer: Medicare Other | Admitting: General Practice

## 2014-03-13 VITALS — BP 178/75 | HR 64 | Temp 97.4°F | Ht 66.0 in | Wt 199.2 lb

## 2014-03-13 DIAGNOSIS — I2510 Atherosclerotic heart disease of native coronary artery without angina pectoris: Secondary | ICD-10-CM

## 2014-03-13 DIAGNOSIS — E109 Type 1 diabetes mellitus without complications: Secondary | ICD-10-CM

## 2014-03-13 DIAGNOSIS — I10 Essential (primary) hypertension: Secondary | ICD-10-CM

## 2014-03-13 DIAGNOSIS — F3289 Other specified depressive episodes: Secondary | ICD-10-CM

## 2014-03-13 DIAGNOSIS — F329 Major depressive disorder, single episode, unspecified: Secondary | ICD-10-CM

## 2014-03-13 DIAGNOSIS — E785 Hyperlipidemia, unspecified: Secondary | ICD-10-CM

## 2014-03-13 DIAGNOSIS — N390 Urinary tract infection, site not specified: Secondary | ICD-10-CM

## 2014-03-13 DIAGNOSIS — F411 Generalized anxiety disorder: Secondary | ICD-10-CM

## 2014-03-13 DIAGNOSIS — R309 Painful micturition, unspecified: Secondary | ICD-10-CM

## 2014-03-13 DIAGNOSIS — F32A Depression, unspecified: Secondary | ICD-10-CM

## 2014-03-13 DIAGNOSIS — K219 Gastro-esophageal reflux disease without esophagitis: Secondary | ICD-10-CM

## 2014-03-13 DIAGNOSIS — R300 Dysuria: Secondary | ICD-10-CM

## 2014-03-13 LAB — POCT UA - MICROSCOPIC ONLY
Bacteria, U Microscopic: NEGATIVE
CASTS, UR, LPF, POC: NEGATIVE
Crystals, Ur, HPF, POC: NEGATIVE
YEAST UA: NEGATIVE

## 2014-03-13 LAB — POCT URINALYSIS DIPSTICK
BILIRUBIN UA: NEGATIVE
Glucose, UA: NEGATIVE
KETONES UA: NEGATIVE
Nitrite, UA: NEGATIVE
Spec Grav, UA: 1.025
Urobilinogen, UA: NEGATIVE
pH, UA: 5

## 2014-03-13 MED ORDER — CLOPIDOGREL BISULFATE 75 MG PO TABS: 75.0000 mg | ORAL_TABLET | Freq: Every day | ORAL | Status: DC

## 2014-03-13 MED ORDER — LORAZEPAM 0.5 MG PO TABS: ORAL_TABLET | ORAL | Status: DC

## 2014-03-13 MED ORDER — ATORVASTATIN CALCIUM 20 MG PO TABS: 20.0000 mg | ORAL_TABLET | Freq: Every day | ORAL | Status: DC

## 2014-03-13 MED ORDER — ESOMEPRAZOLE MAGNESIUM 40 MG PO CPDR: DELAYED_RELEASE_CAPSULE | ORAL | Status: DC

## 2014-03-13 MED ORDER — INSULIN NPH (HUMAN) (ISOPHANE) 100 UNIT/ML ~~LOC~~ SUSP: 92.0000 [IU] | Freq: Every day | SUBCUTANEOUS | Status: DC

## 2014-03-13 MED ORDER — FUROSEMIDE 20 MG PO TABS: 20.0000 mg | ORAL_TABLET | Freq: Every day | ORAL | Status: DC

## 2014-03-13 MED ORDER — LISINOPRIL 5 MG PO TABS: ORAL_TABLET | ORAL | Status: DC

## 2014-03-13 MED ORDER — NITROFURANTOIN MONOHYD MACRO 100 MG PO CAPS: 100.0000 mg | ORAL_CAPSULE | Freq: Two times a day (BID) | ORAL | Status: DC

## 2014-03-13 MED ORDER — PAROXETINE HCL 10 MG PO TABS: 10.0000 mg | ORAL_TABLET | Freq: Every day | ORAL | Status: DC

## 2014-03-13 MED ORDER — METOPROLOL TARTRATE 50 MG PO TABS: 50.0000 mg | ORAL_TABLET | Freq: Two times a day (BID) | ORAL | Status: DC

## 2014-03-13 MED ORDER — AMLODIPINE BESYLATE 5 MG PO TABS: ORAL_TABLET | ORAL | Status: DC

## 2014-03-13 NOTE — Patient Instructions (Signed)

## 2014-03-13 NOTE — Progress Notes (Signed)
Subjective:    [name], female    DOB: [birth year], 80 y.o.   MRN: [id number]  HPI Presents today for follow up of chronic health conditions. History of hypertension, hyperlipidemia, gerd, IDDM, GAD, and depression. Taking medications as prescribed. Checking blood sugars 2-3 times weekly. Medication for anxiety and depression are effective. Denies mood swings, thought of harming self or others. Eating a healthy diet.      Review of Systems  Constitutional: Negative for fever and chills.  Respiratory: Negative for chest tightness and shortness of breath.   Cardiovascular: Negative for chest pain and palpitations.  Gastrointestinal: Negative for vomiting, abdominal pain, diarrhea and blood in stool.  Genitourinary: Negative for dysuria, hematuria and difficulty urinating.  Musculoskeletal: Negative for neck pain and neck stiffness.  Neurological: Negative for dizziness, weakness, numbness and headaches.       Objective:   Physical Exam  Constitutional: She is oriented to person, place, and time. She appears well-developed and well-nourished.  HENT:  Head: Normocephalic and atraumatic.  Right Ear: External ear normal.  Left Ear: External ear normal.  Eyes: EOM are normal.  Neck: Normal range of motion. Neck supple.  Cardiovascular: Normal rate, regular rhythm and normal heart sounds.   Pulmonary/Chest: Effort normal and breath sounds normal.  Abdominal: Soft. Bowel sounds are normal.  Neurological: She is alert and oriented to person, place, and time.  Skin: Skin is warm and dry.  Psychiatric: She has a normal mood and affect.          Assessment & Plan:  1. Pain emptying bladder  - POCT urinalysis dipstick - POCT UA - Microscopic Only  2. Essential hypertension, benign  - CMP14+EGFR - amLODipine (NORVASC) 5 MG tablet; TAKE 1 & 1/2 TABLETS BY MOUTH ONCE DAILY.  Dispense: 45 tablet; Refill: 3 - furosemide (LASIX) 20 MG tablet; Take 1 tablet (20 mg  total) by mouth daily.  Dispense: 30 tablet; Refill: 3 - lisinopril (PRINIVIL,ZESTRIL) 5 MG tablet; TAKE 1 TABLET ONCE DAILY.  Dispense: 30 tablet; Refill: 3 - metoprolol (LOPRESSOR) 50 MG tablet; Take 1 tablet (50 mg total) by mouth 2 (two) times daily.  Dispense: 60 tablet; Refill: 3  3. Hyperlipidemia  - Lipid panel - atorvastatin (LIPITOR) 20 MG tablet; Take 1 tablet (20 mg total) by mouth daily.  Dispense: 30 tablet; Refill: 3  4. CAD (coronary artery disease)  - atorvastatin (LIPITOR) 20 MG tablet; Take 1 tablet (20 mg total) by mouth daily.  Dispense: 30 tablet; Refill: 3 - clopidogrel (PLAVIX) 75 MG tablet; Take 1 tablet (75 mg total) by mouth daily.  Dispense: 30 tablet; Refill: 3  5. GERD (gastroesophageal reflux disease)  - esomeprazole (NEXIUM) 40 MG capsule; TAKE 1 CAPSULE ONCE DAILY FOR ACID REFLUX.  Dispense: 30 capsule; Refill: 3  6. GAD (generalized anxiety disorder)  - LORazepam (ATIVAN) 0.5 MG tablet; TAKE 1 TABLET BY MOUTH 2 TIMES DAILY, as needed  Dispense: 60 tablet; Refill: 1  7. Depression  - PARoxetine (PAXIL) 10 MG tablet; Take 1 tablet (10 mg total) by mouth daily.  Dispense: 30 tablet; Refill: 3  8. DM (diabetes mellitus), type 1  - POCT glycosylated hemoglobin (Hb A1C) - insulin NPH Human (HUMULIN N) 100 UNIT/ML injection; Inject 0.92 mLs (92 Units total) into the skin daily before breakfast.  Dispense: 40 mL; Refill: 5  9. UTI (urinary tract infection)  - nitrofurantoin, macrocrystal-monohydrate, (MACROBID) 100 MG capsule; Take 1 capsule (100 mg total) by mouth 2 (two)  times daily.  Dispense: 20 capsule; Refill: 0 Continue all current medications Labs pending F/u in 3 months Discussed benefits of eating healthy Patient verbalized understanding Erby Pian, FNP-C

## 2014-03-13 NOTE — Telephone Encounter (Signed)
Ativan rx called in (one with one refill).

## 2014-03-19 ENCOUNTER — Encounter: Payer: Self-pay | Admitting: *Deleted

## 2014-03-21 ENCOUNTER — Other Ambulatory Visit: Payer: Self-pay | Admitting: General Practice

## 2014-03-29 ENCOUNTER — Other Ambulatory Visit: Payer: Self-pay | Admitting: General Practice

## 2014-04-03 ENCOUNTER — Other Ambulatory Visit: Payer: Self-pay | Admitting: General Practice

## 2014-04-24 ENCOUNTER — Telehealth: Payer: Self-pay | Admitting: Nurse Practitioner

## 2014-04-24 NOTE — Telephone Encounter (Signed)
appt scheduled for thurs with [facility]

## 2014-04-26 ENCOUNTER — Ambulatory Visit (INDEPENDENT_AMBULATORY_CARE_PROVIDER_SITE_OTHER): Payer: Medicare Other | Admitting: Nurse Practitioner

## 2014-04-26 ENCOUNTER — Encounter: Payer: Self-pay | Admitting: Nurse Practitioner

## 2014-04-26 VITALS — BP 170/66 | HR 60 | Temp 97.0°F | Ht 66.0 in | Wt 197.0 lb

## 2014-04-26 DIAGNOSIS — F03A0 Unspecified dementia, mild, without behavioral disturbance, psychotic disturbance, mood disturbance, and anxiety: Secondary | ICD-10-CM

## 2014-04-26 DIAGNOSIS — F329 Major depressive disorder, single episode, unspecified: Secondary | ICD-10-CM

## 2014-04-26 DIAGNOSIS — N390 Urinary tract infection, site not specified: Secondary | ICD-10-CM

## 2014-04-26 DIAGNOSIS — F0390 Unspecified dementia without behavioral disturbance: Secondary | ICD-10-CM

## 2014-04-26 DIAGNOSIS — F3289 Other specified depressive episodes: Secondary | ICD-10-CM

## 2014-04-26 DIAGNOSIS — I2510 Atherosclerotic heart disease of native coronary artery without angina pectoris: Secondary | ICD-10-CM

## 2014-04-26 DIAGNOSIS — F32A Depression, unspecified: Secondary | ICD-10-CM

## 2014-04-26 LAB — POCT URINALYSIS DIPSTICK
Bilirubin, UA: NEGATIVE
GLUCOSE UA: NEGATIVE
KETONES UA: NEGATIVE
Nitrite, UA: NEGATIVE
SPEC GRAV UA: 1.02
Urobilinogen, UA: NEGATIVE
pH, UA: 5

## 2014-04-26 LAB — POCT UA - MICROSCOPIC ONLY
Casts, Ur, LPF, POC: NEGATIVE
Mucus, UA: NEGATIVE
Yeast, UA: NEGATIVE

## 2014-04-26 MED ORDER — PAROXETINE HCL 20 MG PO TABS: 20.0000 mg | ORAL_TABLET | Freq: Every day | ORAL | Status: DC

## 2014-04-26 MED ORDER — CIPROFLOXACIN HCL 500 MG PO TABS: 500.0000 mg | ORAL_TABLET | Freq: Two times a day (BID) | ORAL | Status: DC

## 2014-04-26 NOTE — Addendum Note (Signed)
Addended by: Pollyann Kennedy F on: 04/26/2014 04:11 PM   Modules accepted: Orders

## 2014-04-26 NOTE — Progress Notes (Signed)
   Subjective:    [name], female    DOB: August 22, 1931, 80 y.o.   MRN: [id number]  HPI Patient brought in by daughter with several complaints: * Urinary frequency and nocturia- Has some urinary incontinence. * Daughter says that she gets anxious a lot- ativan helps but nit sure paxil not helping very much. *daughter thinks she is having trouble with memory- wants her tested.    Review of Systems  Constitutional: Negative.   HENT: Negative.   Respiratory: Negative.   Cardiovascular: Negative.   Genitourinary: Positive for urgency and frequency.  Neurological: Negative.   Psychiatric/Behavioral: Positive for agitation.  All other systems reviewed and are negative.      Objective:   Physical Exam  Constitutional: She is oriented to person, place, and time. She appears well-developed and well-nourished.  Cardiovascular: Normal rate, regular rhythm and normal heart sounds.   Pulmonary/Chest: Effort normal and breath sounds normal.  Abdominal: Soft. Bowel sounds are normal.  Neurological: She is alert and oriented to person, place, and time.  Skin: Skin is warm and dry.  Psychiatric: She has a normal mood and affect. Her behavior is normal. Judgment and thought content normal.   BP 170/66  Pulse 60  Temp(Src) 97 F (36.1 C) (Oral)  Ht 5\' 6"  (1.676 m)  Wt 197 lb (89.359 kg)  BMI 31.81 kg/m2  Mini-mental exam- Score 24/30  Results for orders placed in visit on 04/26/14  POCT UA - MICROSCOPIC ONLY      Result Value Ref Range   WBC, Ur, HPF, POC tntc     RBC, urine, microscopic 10-12     Bacteria, U Microscopic moderate     Mucus, UA negative     Epithelial cells, urine per micros few     Crystals, Ur, HPF, POC nagative     Casts, Ur, LPF, POC negative     Yeast, UA negative    POCT URINALYSIS DIPSTICK      Result Value Ref Range   Color, UA gold     Clarity, UA cloudy     Glucose, UA negative     Bilirubin, UA negative     Ketones, UA negative     Spec Grav, UA 1.020     Blood, UA moderate     pH, UA 5.0     Protein, UA 4+     Urobilinogen, UA negative     Nitrite, UA negative     Leukocytes, UA large (3+)           Assessment & Plan:  1. UTI (urinary tract infection) Force fluids - POCT UA - Microscopic Only - POCT urinalysis dipstick - ciprofloxacin (CIPRO) 500 MG tablet; Take 1 tablet (500 mg total) by mouth 2 (two) times daily.  Dispense: 14 tablet; Refill: 0  2. Depression Stress management Increased paxil to 20 mg daily - PARoxetine (PAXIL) 20 MG tablet; Take 1 tablet (20 mg total) by mouth daily.  Dispense: 30 tablet; Refill: 5  3. Mild dementia Will watch No meds at this time  [place], FNP

## 2014-04-26 NOTE — Patient Instructions (Signed)
Urinary Tract Infection  Urinary tract infections (UTIs) can develop anywhere along your urinary tract. Your urinary tract is your body's drainage system for removing wastes and extra water. Your urinary tract includes two kidneys, two ureters, a bladder, and a urethra. Your kidneys are a pair of bean-shaped organs. Each kidney is about the size of your fist. They are located below your ribs, one on each side of your spine.  CAUSES  Infections are caused by microbes, which are microscopic organisms, including fungi, viruses, and bacteria. These organisms are so small that they can only be seen through a microscope. Bacteria are the microbes that most commonly cause UTIs.  SYMPTOMS   Symptoms of UTIs may vary by age and gender of the patient and by the location of the infection. Symptoms in young women typically include a frequent and intense urge to urinate and a painful, burning feeling in the bladder or urethra during urination. Older women and men are more likely to be tired, shaky, and weak and have muscle aches and abdominal pain. A fever may mean the infection is in your kidneys. Other symptoms of a kidney infection include pain in your back or sides below the ribs, nausea, and vomiting.  DIAGNOSIS  To diagnose a UTI, your caregiver will ask you about your symptoms. Your caregiver also will ask to provide a urine sample. The urine sample will be tested for bacteria and white blood cells. White blood cells are made by your body to help fight infection.  TREATMENT   Typically, UTIs can be treated with medication. Because most UTIs are caused by a bacterial infection, they usually can be treated with the use of antibiotics. The choice of antibiotic and length of treatment depend on your symptoms and the type of bacteria causing your infection.  HOME CARE INSTRUCTIONS   If you were prescribed antibiotics, take them exactly as your caregiver instructs you. Finish the medication even if you feel better after you  have only taken some of the medication.   Drink enough water and fluids to keep your urine clear or pale yellow.   Avoid caffeine, tea, and carbonated beverages. They tend to irritate your bladder.   Empty your bladder often. Avoid holding urine for long periods of time.   Empty your bladder before and after sexual intercourse.   After a bowel movement, women should cleanse from front to back. Use each tissue only once.  SEEK MEDICAL CARE IF:    You have back pain.   You develop a fever.   Your symptoms do not begin to resolve within 3 days.  SEEK IMMEDIATE MEDICAL CARE IF:    You have severe back pain or lower abdominal pain.   You develop chills.   You have nausea or vomiting.   You have continued burning or discomfort with urination.  MAKE SURE YOU:    Understand these instructions.   Will watch your condition.   Will get help right away if you are not doing well or get worse.  Document Released: 09/02/2005 Document Revised: 05/24/2012 Document Reviewed: 01/01/2012  [employer] Patient Information 2014 [employer]

## 2014-04-28 LAB — URINE CULTURE

## 2014-04-30 ENCOUNTER — Other Ambulatory Visit: Payer: Self-pay | Admitting: General Practice

## 2014-05-09 ENCOUNTER — Other Ambulatory Visit: Payer: Self-pay | Admitting: Nurse Practitioner

## 2014-05-10 NOTE — Telephone Encounter (Signed)
Last seen 04/26/14  MMM

## 2014-05-10 NOTE — Telephone Encounter (Signed)
Do not do refill on antibiotics

## 2014-05-15 ENCOUNTER — Ambulatory Visit (INDEPENDENT_AMBULATORY_CARE_PROVIDER_SITE_OTHER): Payer: Medicare Other | Admitting: Family Medicine

## 2014-05-15 VITALS — BP 124/66 | HR 64 | Temp 99.4°F | Wt 198.0 lb

## 2014-05-15 DIAGNOSIS — W57XXXA Bitten or stung by nonvenomous insect and other nonvenomous arthropods, initial encounter: Secondary | ICD-10-CM

## 2014-05-15 DIAGNOSIS — T148 Other injury of unspecified body region: Secondary | ICD-10-CM

## 2014-05-15 MED ORDER — HYDROXYZINE HCL 25 MG PO TABS: 25.0000 mg | ORAL_TABLET | Freq: Three times a day (TID) | ORAL | Status: DC | PRN

## 2014-05-15 MED ORDER — DOXYCYCLINE HYCLATE 100 MG PO TABS: 100.0000 mg | ORAL_TABLET | Freq: Two times a day (BID) | ORAL | Status: DC

## 2014-05-15 NOTE — Progress Notes (Signed)
   Subjective:    [name], female    DOB: 11-Dec-1930, 80 y.o.   MRN: [id number]  HPI  This 80 y.o. female presents for evaluation of tick bite and rash on right leg.  Review of Systems    No chest pain, SOB, HA, dizziness, vision change, N/V, diarrhea, constipation, dysuria, urinary urgency or frequency, myalgias, arthralgias or rash.  Objective:   Physical Exam  Vital signs noted  Well developed well nourished female.  HEENT - Head atraumatic Normocephalic Respiratory - Lungs CTA bilateral Cardiac - RRR S1 and S2 without murmur Skin - Erythema left shin approx 6cm      Assessment & Plan:  Tick bite - Plan: doxycycline (VIBRA-TABS) 100 MG tablet, hydrOXYzine (ATARAX/VISTARIL) 25 MG tablet  Morgan Penner FNP

## 2014-05-30 ENCOUNTER — Other Ambulatory Visit: Payer: Self-pay | Admitting: Family Medicine

## 2014-05-30 ENCOUNTER — Ambulatory Visit (INDEPENDENT_AMBULATORY_CARE_PROVIDER_SITE_OTHER): Payer: Medicare Other | Admitting: Family Medicine

## 2014-05-30 VITALS — BP 129/73 | HR 66 | Temp 97.1°F | Ht 66.0 in | Wt 195.0 lb

## 2014-05-30 DIAGNOSIS — L0293 Carbuncle, unspecified: Secondary | ICD-10-CM

## 2014-05-30 DIAGNOSIS — L0292 Furuncle, unspecified: Secondary | ICD-10-CM

## 2014-05-30 MED ORDER — DOXYCYCLINE HYCLATE 100 MG PO TABS: 100.0000 mg | ORAL_TABLET | Freq: Two times a day (BID) | ORAL | Status: DC

## 2014-05-30 NOTE — Progress Notes (Signed)
   Subjective:    Patient ID: [name], female    DOB: 1931-08-09, 80 y.o.   MRN: [id number]  HPI  C/o boil on back  Review of Systems    No chest pain, SOB, HA, dizziness, vision change, N/V, diarrhea, constipation, dysuria, urinary urgency or frequency, myalgias, arthralgias or rash.  Objective:   Physical Exam  Vital signs noted  Well developed well nourished female.  HEENT - Head atraumatic Normocephalic Respiratory - Lungs CTA bilateral Skin - Abscess on left back  Procedure- Furuncle prepped with betadine and anesthetized with 1% lido w/ epi 2 cc's and then horizontal incision is made and purulent drainage and serous sanguin, and then sebum removed and then sterile pick ups used to undermine any loculations and more serous sanguin drainage and  Sebum removed and then 4x4 dressing applied.     Assessment & Plan:  Furuncle - Plan: doxycycline (VIBRA-TABS) 100 MG tablet, Wound culture Explained to keep dressing on for 24 hours and if not healing or if returns then follow up. Lysbeth Penner FNP

## 2014-06-02 ENCOUNTER — Other Ambulatory Visit: Payer: Self-pay | Admitting: Family Medicine

## 2014-06-02 ENCOUNTER — Other Ambulatory Visit: Payer: Self-pay | Admitting: General Practice

## 2014-06-04 ENCOUNTER — Other Ambulatory Visit: Payer: Self-pay | Admitting: General Practice

## 2014-06-07 LAB — AEROBIC CULTURE

## 2014-06-15 ENCOUNTER — Ambulatory Visit (INDEPENDENT_AMBULATORY_CARE_PROVIDER_SITE_OTHER): Payer: Medicare Other | Admitting: Nurse Practitioner

## 2014-06-15 ENCOUNTER — Ambulatory Visit: Payer: Medicare Other | Admitting: General Practice

## 2014-06-15 ENCOUNTER — Encounter: Payer: Self-pay | Admitting: Nurse Practitioner

## 2014-06-15 VITALS — BP 136/74 | HR 65 | Temp 97.9°F | Ht 66.0 in | Wt 194.0 lb

## 2014-06-15 DIAGNOSIS — F411 Generalized anxiety disorder: Secondary | ICD-10-CM

## 2014-06-15 DIAGNOSIS — E785 Hyperlipidemia, unspecified: Secondary | ICD-10-CM

## 2014-06-15 DIAGNOSIS — E119 Type 2 diabetes mellitus without complications: Secondary | ICD-10-CM

## 2014-06-15 DIAGNOSIS — I2510 Atherosclerotic heart disease of native coronary artery without angina pectoris: Secondary | ICD-10-CM

## 2014-06-15 DIAGNOSIS — I10 Essential (primary) hypertension: Secondary | ICD-10-CM

## 2014-06-15 LAB — POCT UA - MICROALBUMIN: Microalbumin Ur, POC: 50 mg/L

## 2014-06-15 LAB — POCT GLYCOSYLATED HEMOGLOBIN (HGB A1C): Hemoglobin A1C: 9.7

## 2014-06-15 MED ORDER — PAROXETINE HCL 10 MG PO TABS: ORAL_TABLET | ORAL | Status: DC

## 2014-06-15 MED ORDER — CLOPIDOGREL BISULFATE 75 MG PO TABS: ORAL_TABLET | ORAL | Status: DC

## 2014-06-15 MED ORDER — LORAZEPAM 0.5 MG PO TABS: ORAL_TABLET | ORAL | Status: DC

## 2014-06-15 MED ORDER — NITROGLYCERIN 0.4 MG SL SUBL: SUBLINGUAL_TABLET | SUBLINGUAL | Status: DC

## 2014-06-15 MED ORDER — MECLIZINE HCL 25 MG PO TABS: ORAL_TABLET | ORAL | Status: DC

## 2014-06-15 NOTE — Progress Notes (Signed)
Subjective:    [name], female    DOB: [birth year], 80 y.o.   MRN: [id number]  Patient is here today for chronic disease follow up. No change since last visit. No complaint.   Diabetes She presents for her follow-up diabetic visit. Pertinent negatives for diabetes include no blurred vision and no chest pain. Symptoms are stable. Risk factors for coronary artery disease include post-menopausal, hypertension and dyslipidemia. Current diabetic treatment includes diet and insulin injections. She is compliant with treatment some of the time (not taking insulin like she is suppose to.). Her weight is stable. She participates in exercise intermittently. Her home blood glucose trend is fluctuating minimally. Her breakfast blood glucose range is generally 140-180 mg/dl. She sees a podiatrist. Hypertension This is a chronic problem. The current episode started more than 1 year ago. Pertinent negatives include no blurred vision, chest pain, peripheral edema or shortness of breath. Risk factors for coronary artery disease include diabetes mellitus and obesity. There are no compliance problems.   Hyperlipidemia This is a chronic problem. The current episode started more than 1 year ago. The problem is controlled. Recent lipid tests were reviewed and are normal. Pertinent negatives include no chest pain, focal sensory loss, focal weakness, leg pain or shortness of breath. There are no compliance problems.  Risk factors for coronary artery disease include diabetes mellitus, hypertension and post-menopausal.      Review of Systems  Eyes: Negative for blurred vision.  Respiratory: Negative for shortness of breath.   Cardiovascular: Negative for chest pain.  Neurological: Negative for focal weakness.  All other systems reviewed and are negative.      Objective:   Physical Exam  Constitutional: She is oriented to person, place, and time. She appears well-developed and well-nourished.    HENT:  Head: Normocephalic.  Right Ear: Hearing, tympanic membrane, external ear and ear canal normal.  Left Ear: Hearing, tympanic membrane, external ear and ear canal normal.  Nose: Nose normal.  Mouth/Throat: Uvula is midline, oropharynx is clear and moist and mucous membranes are normal.  Eyes: Conjunctivae and EOM are normal. Pupils are equal, round, and reactive to light.  Neck: Normal range of motion. Neck supple. No JVD present. No thyromegaly present.  Cardiovascular: Normal rate, normal heart sounds and intact distal pulses.   No murmur heard. Pulmonary/Chest: Effort normal and breath sounds normal. She has no wheezes. She has no rales.  Abdominal: Soft. Bowel sounds are normal. She exhibits no mass. There is no tenderness.  Musculoskeletal: Normal range of motion.  Neurological: She is alert and oriented to person, place, and time. She has normal reflexes.  Skin: Skin is warm and dry.  Psychiatric: She has a normal mood and affect. Her behavior is normal. Judgment and thought content normal.   BP 136/74  Pulse 65  Temp(Src) 97.9 F (36.6 C) (Oral)  Ht _0  (1.676 m)  Wt 194 lb (87.998 kg)  BMI 31.33 kg/m2  Results for orders placed in visit on 06/15/14  POCT GLYCOSYLATED HEMOGLOBIN (HGB A1C)      Result Value Ref Range   Hemoglobin A1C 9.7            Assessment & Plan:   1. CAD   2. DM   3. HYPERTENSION   4. DYSLIPIDEMIA   5. GAD (generalized anxiety disorder)    Orders Placed This Encounter  Procedures  . CMP14+EGFR  . NMR, lipoprofile  . POCT glycosylated hemoglobin (Hb A1C)   Meds  ordered this encounter  Medications  . clopidogrel (PLAVIX) 75 MG tablet    Sig: TAKE (1) TABLET BY MOUTH ONCE DAILY.    Dispense:  30 tablet    Refill:  5    Order Specific Question:  Supervising Provider    Answer:  Chipper Herb [[id number]]  . LORazepam (ATIVAN) 0.5 MG tablet    Sig: TAKE 1 TABLET BY MOUTH 2 TIMES DAILY, as needed    Dispense:  60 tablet     Refill:  1    Order Specific Question:  Supervising Provider    Answer:  Chipper Herb [[id number]]  . meclizine (ANTIVERT) 25 MG tablet    Sig: TAKE 1 TABLET 2 TIMES A DAY AS NEEDED FOR DIZZINESS.    Dispense:  60 tablet    Refill:  5    Order Specific Question:  Supervising Provider    Answer:  Chipper Herb [[id number]]  . nitroGLYCERIN (NITROSTAT) 0.4 MG SL tablet    Sig: PLACE ONE (1) TABLET UNDER TONGUE EVERY 5 MINUTES UP TO (3) DOSES AS NEEDED FOR CHEST PAIN.    Dispense:  30 tablet    Refill:  0    Order Specific Question:  Supervising Provider    Answer:  Chipper Herb [[id number]]  . PARoxetine (PAXIL) 10 MG tablet    Sig: TAKE (1) TABLET BY MOUTH ONCE DAILY.    Dispense:  30 tablet    Refill:  5    Order Specific Question:  Supervising Provider    Answer:  Chipper Herb [[id number]]   needs appointment with clinical pharmacist to discuss diabetes- may do better on lantus or levemir hemoccult cards given to patient with directions Labs pending Health maintenance reviewed Diet and exercise encouraged Continue all meds Follow up  In 3 months   [place], FNP

## 2014-06-15 NOTE — Patient Instructions (Signed)
Diabetes and Exercise Exercising regularly is important. It is not just about losing weight. It has many health benefits, such as:  Improving your overall fitness, flexibility, and endurance.  Increasing your bone density.  Helping with weight control.  Decreasing your body fat.  Increasing your muscle strength.  Reducing stress and tension.  Improving your overall health. People with diabetes who exercise gain additional benefits because exercise:  Reduces appetite.  Improves the body's use of blood sugar (glucose).  Helps lower or control blood glucose.  Decreases blood pressure.  Helps control blood lipids (such as cholesterol and triglycerides).  Improves the body's use of the hormone insulin by:  Increasing the body's insulin sensitivity.  Reducing the body's insulin needs.  Decreases the risk for heart disease because exercising:  Lowers cholesterol and triglycerides levels.  Increases the levels of good cholesterol (such as high-density lipoproteins [HDL]) in the body.  Lowers blood glucose levels. YOUR ACTIVITY PLAN  Choose an activity that you enjoy and set realistic goals. Your health care provider or diabetes educator can help you make an activity plan that works for you. You can break activities into 2 or 3 sessions throughout the day. Doing so is as good as one long session. Exercise ideas include:  Taking the dog for a walk.  Taking the stairs instead of the elevator.  Dancing to your favorite song.  Doing your favorite exercise with a friend. RECOMMENDATIONS FOR EXERCISING WITH TYPE 1 OR TYPE 2 DIABETES   Check your blood glucose before exercising. If blood glucose levels are greater than 240 mg/dL, check for urine ketones. Do not exercise if ketones are present.  Avoid injecting insulin into areas of the body that are going to be exercised. For example, avoid injecting insulin into:  The arms when playing tennis.  The legs when  jogging.  Keep a record of:  Food intake before and after you exercise.  Expected peak times of insulin action.  Blood glucose levels before and after you exercise.  The type and amount of exercise you have done.  Review your records with your health care provider. Your health care provider will help you to develop guidelines for adjusting food intake and insulin amounts before and after exercising.  If you take insulin or oral hypoglycemic agents, watch for signs and symptoms of hypoglycemia. They include:  Dizziness.  Shaking.  Sweating.  Chills.  Confusion.  Drink plenty of water while you exercise to prevent dehydration or heat stroke. Body water is lost during exercise and must be replaced.  Talk to your health care provider before starting an exercise program to make sure it is safe for you. Remember, almost any type of activity is better than none. Document Released: 02/13/2004 Document Revised: 07/26/2013 Document Reviewed: 05/02/2013 Exit[employer] Patient Information 2015 [employer]. This information is not intended to replace advice given to you by your health care provider. Make sure you discuss any questions you have with your health care provider.  

## 2014-06-15 NOTE — Addendum Note (Signed)
Addended by: Earlene Plater on: 06/15/2014 11:26 AM   Modules accepted: Orders

## 2014-06-16 LAB — NMR, LIPOPROFILE
Cholesterol: 143 mg/dL (ref 100–199)
HDL CHOLESTEROL BY NMR: 53 mg/dL (ref >39)
HDL PARTICLE NUMBER: 34.5 umol/L (ref >=30.5)
LDL Particle Number: 444 nmol/L (ref <1000)
LDL Size: 19.7 nm (ref >20.5)
LDLC SERPL CALC-MCNC: 42 mg/dL (ref 0–99)
LP-IR Score: 49 — ABNORMAL HIGH (ref <=45)
Small LDL Particle Number: 360 nmol/L (ref <=527)
TRIGLYCERIDES BY NMR: 239 mg/dL — AB (ref 0–149)

## 2014-06-16 LAB — CMP14+EGFR
A/G RATIO: 1.5 (ref 1.1–2.5)
ALT: 11 IU/L (ref 0–32)
AST: 14 IU/L (ref 0–40)
Albumin: 3.6 g/dL (ref 3.5–4.7)
Alkaline Phosphatase: 49 IU/L (ref 39–117)
BILIRUBIN TOTAL: 0.2 mg/dL (ref 0.0–1.2)
BUN/Creatinine Ratio: 22 (ref 11–26)
BUN: 22 mg/dL (ref 8–27)
CALCIUM: 9.8 mg/dL (ref 8.7–10.3)
CO2: 25 mmol/L (ref 18–29)
CREATININE: 1.02 mg/dL — AB (ref 0.57–1.00)
Chloride: 97 mmol/L (ref 97–108)
GFR, EST AFRICAN AMERICAN: 60 mL/min/{1.73_m2} (ref >59)
GFR, EST NON AFRICAN AMERICAN: 52 mL/min/{1.73_m2} — AB (ref >59)
Globulin, Total: 2.4 g/dL (ref 1.5–4.5)
Glucose: 175 mg/dL — ABNORMAL HIGH (ref 65–99)
POTASSIUM: 5.3 mmol/L — AB (ref 3.5–5.2)
SODIUM: 141 mmol/L (ref 134–144)
Total Protein: 6 g/dL (ref 6.0–8.5)

## 2014-06-16 LAB — MICROALBUMIN, URINE: Microalbumin, Urine: 103.7 ug/mL — ABNORMAL HIGH (ref 0.0–17.0)

## 2014-06-19 ENCOUNTER — Ambulatory Visit (INDEPENDENT_AMBULATORY_CARE_PROVIDER_SITE_OTHER): Payer: Medicare Other | Admitting: Pharmacist

## 2014-06-19 ENCOUNTER — Encounter: Payer: Self-pay | Admitting: Pharmacist

## 2014-06-19 VITALS — BP 126/62 | HR 68 | Ht 66.0 in | Wt 197.0 lb

## 2014-06-19 DIAGNOSIS — E669 Obesity, unspecified: Secondary | ICD-10-CM

## 2014-06-19 DIAGNOSIS — I2510 Atherosclerotic heart disease of native coronary artery without angina pectoris: Secondary | ICD-10-CM

## 2014-06-19 DIAGNOSIS — I10 Essential (primary) hypertension: Secondary | ICD-10-CM

## 2014-06-19 DIAGNOSIS — IMO0001 Reserved for inherently not codable concepts without codable children: Secondary | ICD-10-CM

## 2014-06-19 DIAGNOSIS — E785 Hyperlipidemia, unspecified: Secondary | ICD-10-CM

## 2014-06-19 DIAGNOSIS — E1165 Type 2 diabetes mellitus with hyperglycemia: Principal | ICD-10-CM

## 2014-06-19 MED ORDER — GLUCOSE BLOOD VI STRP: ORAL_STRIP | Status: DC

## 2014-06-19 MED ORDER — METFORMIN HCL ER 500 MG PO TB24: 500.0000 mg | ORAL_TABLET | Freq: Every day | ORAL | Status: DC

## 2014-06-19 NOTE — Progress Notes (Signed)
Diabetes Follow-Up Visit Chief Complaint:   Chief Complaint  Patient presents with  . Diabetes    uncontrolled     Filed Vitals:   06/19/14 1414  BP: 126/62  Pulse: 68    HPI:  Patient with uncontrolled type 2 DM for 13 years.  Her most recent A1C was 9.7%.  He daughter is with her and states that patient is not taking insulin at the same time every day.  Patient did not bring BG monitor so I only have her recollection that BG readings have been 150 to 250's.  She usually checks BG once daily in the evening.   She does report occasional lows and had a low BG that was not confirmed by BG reading but patient was sweating and felt "gittery" - she ate peanut butter to help increase BG  Current Diabetes Medications:  Insulin NPH 95 units qam and as needed at bedtime if BG elevated - given 10 units and then rechecks in 1 hour, if not below 200 then given 10 more units.  Low fat/carbohydrate diet?  No Nicotine Abuse?  No Medication Compliance?  No Exercise?  No Alcohol Abuse?  No   Exam BMI:  Body mass index is 31.81 kg/(m^2).   Weight changes:  stable General Appearance:  alert, oriented, no acute distress and obese Mood/Affect:  normal   Lab Results  Component Value Date   HGBA1C 9.7 06/15/2014    No results found for this basenameDerl Barrow    Lab Results  Component Value Date   CHOL 143 06/15/2014   HDL 53 06/15/2014   LDLCALC 42 06/15/2014   TRIG 239* 06/15/2014   CHOLHDL 2.5 02/03/2010      Assessment: 1.  Diabetes.  Uncontrolled  2.  Blood Pressure.  At goal today 3.  Lipids.  Tg elevated likely due to uncontrolled DM   Recommendations: 1.  Medication recommendations at this time are as follows:  Add metformn XR 500mg  1 tablet daily with breakfast  Change insluin NPH to 60 units qam with breakfast and 35 units qpm with supper 2.  Reviewed HBG goals:  Fasting 80-130 and 1-2 hour post prandial <180.  Patient is instructed to check BG 2 times per day.    Stressed that more regular checks needed and to bring glucometer to next appt.   3.  BP goal < 140/85. 4.  LDL goal of < 100, HDL > 40 and TG < 150. 5.  Reviewed how to treat hypoglycemia.  Handout given.  SPecifically review type of "quick sugar" she can use and how much to bring BG up when having a low - jelly (no sugar free) 4 oz of regular soda or juice, table sugar, honey 6.  Dietary recommendations:  Reviewd CHO counting and serving sizes   7.  Return to clinic in 3 weeks  Cherre Robins, PharmD, CPP, CDE    Time spent counseling patient:  45 minutes

## 2014-06-19 NOTE — Patient Instructions (Signed)
Start Metformin XR $RemoveBefo'500mg'zvuCOamZKhx$  - take 1 tablet daily with breakfast  Change NHP insulin to 60 units each morning with breakfast and 35 units each evening with supper   Diabetes and Standards of Medical Care  Diabetes is complicated. You may find that your diabetes team includes a dietitian, nurse, diabetes educator, eye doctor, and more. To help everyone know what is going on and to help you get the care you deserve, the following schedule of care was developed to help keep you on track. Below are the tests, exams, vaccines, medicines, education, and plans you will need.  Blood Glucose Goals - check blood glucose twice a day Prior to meals = 80 - 130 Within 2 hours of the start of a meal = less than 180  HbA1c test (goal is less than 7.0% - your last value was 9.7%) This test shows how well you have controlled your glucose over the past 2 3 months. It is used to see if your diabetes management plan needs to be adjusted.   It is performed at least 2 times a year if you are meeting treatment goals.  It is performed 4 times a year if therapy has changed or if you are not meeting treatment goals.   Blood pressure test  This test is performed at every routine medical visit. The goal is less than 140/90 mmHg for most people, but 130/80 mmHg in some cases. Ask your health care provider about your goal. Dental exam  Follow up with the dentist regularly. Eye exam  If you are diagnosed with type 1 diabetes as a child, get an exam upon reaching the age of 29 years or older and have had diabetes for 3 5 years. Yearly eye exams are recommended after that initial eye exam.  If you are diagnosed with type 1 diabetes as an adult, get an exam within 5 years of diagnosis and then yearly.  If you are diagnosed with type 2 diabetes, get an exam as soon as possible after the diagnosis and then yearly. Foot care exam  Visual foot exams are performed at every routine medical visit. The exams check for cuts,  injuries, or other problems with the feet.  A comprehensive foot exam should be done yearly. This includes visual inspection as well as assessing foot pulses and testing for loss of sensation.  Check your feet nightly for cuts, injuries, or other problems with your feet. Tell your health care provider if anything is not healing. Kidney function test (urine microalbumin)  This test is performed once a year.  Type 1 diabetes: The first test is performed 5 years after diagnosis.  Type 2 diabetes: The first test is performed at the time of diagnosis.  A serum creatinine and estimated glomerular filtration rate (eGFR) test is done once a year to assess the level of chronic kidney disease (CKD), if present. Lipid profile (cholesterol, HDL, LDL, triglycerides)  Performed every 5 years for most people.  The goal for LDL is less than 100 mg/dL. If you are at high risk, the goal is less than 70 mg/dL.  The goal for HDL is 40 mg/dL 50 mg/dL for men and 50 mg/dL 60 mg/dL for women. An HDL cholesterol of 60 mg/dL or higher gives some protection against heart disease.  The goal for triglycerides is less than 150 mg/dL. Influenza vaccine, pneumococcal vaccine, and hepatitis B vaccine  The influenza vaccine is recommended yearly.  The pneumococcal vaccine is generally given once in a lifetime. However,  there are some instances when another vaccination is recommended. Check with your health care provider.  The hepatitis B vaccine is also recommended for adults with diabetes. Diabetes self-management education  Education is recommended at diagnosis and ongoing as needed. Treatment plan  Your treatment plan is reviewed at every medical visit. Document Released: 09/20/2009 Document Revised: 07/26/2013 Document Reviewed: 04/25/2013 [facility] Patient Information 2014 [place].

## 2014-06-20 ENCOUNTER — Telehealth: Payer: Self-pay | Admitting: Nurse Practitioner

## 2014-06-20 NOTE — Telephone Encounter (Signed)
Pt aware of lab results 

## 2014-06-20 NOTE — Telephone Encounter (Signed)
Message copied by Cline Crock on Wed Jun 20, 2014 12:22 PM ------      Message from: Chevis Pretty      Created: Tue Jun 19, 2014  8:05 AM       microabbumin elevated- patien ton lisinopril ------

## 2014-06-30 ENCOUNTER — Other Ambulatory Visit: Payer: Self-pay | Admitting: Nurse Practitioner

## 2014-06-30 ENCOUNTER — Other Ambulatory Visit: Payer: Self-pay | Admitting: Family Medicine

## 2014-07-05 ENCOUNTER — Other Ambulatory Visit: Payer: Self-pay | Admitting: Family Medicine

## 2014-07-05 ENCOUNTER — Other Ambulatory Visit: Payer: Self-pay | Admitting: General Practice

## 2014-07-13 ENCOUNTER — Ambulatory Visit (INDEPENDENT_AMBULATORY_CARE_PROVIDER_SITE_OTHER): Payer: Medicare Other | Admitting: Pharmacist

## 2014-07-13 ENCOUNTER — Encounter: Payer: Self-pay | Admitting: Pharmacist

## 2014-07-13 VITALS — BP 126/64 | HR 67 | Ht 66.0 in | Wt 196.0 lb

## 2014-07-13 DIAGNOSIS — M8580 Other specified disorders of bone density and structure, unspecified site: Secondary | ICD-10-CM

## 2014-07-13 DIAGNOSIS — E1165 Type 2 diabetes mellitus with hyperglycemia: Secondary | ICD-10-CM

## 2014-07-13 DIAGNOSIS — Z0000 Encounter for general adult medical examination without abnormal findings: Secondary | ICD-10-CM

## 2014-07-13 NOTE — Progress Notes (Signed)
Subjective:    [name] is a 80 y.o. female who presents for Medicare Initial Wellness Visit and recheck type 2 DM - with insulin use to control.    HPI - patient was last seen 1 month ago for diabetes.  NPH insulin dose was adjusted (she was having high in day and low during sleep) to 60 units qam and 35 units qpm.  Also added metformin XR 519m 1 tablet daily.  HBG readings have improved some - pm 254. 170, 174      Am - 156, 124, 100, 76, 277 Patient reports 1 hypoglycemic episode that occurred around 3am that was in the 50's  Preventive Screening-Counseling & Management  Tobacco History  Smoking status  . Never Smoker   Smokeless tobacco  . Never Used    Current Problems (verified) Patient Active Problem List   Diagnosis Date Noted  . Obesity (BMI 30-39.9) 06/19/2014  . Carotid stenosis   . Anemia of chronic disease 12/25/2011  . Iron deficiency anemia 05/27/2011  . ACUTE DIASTOLIC HEART FAILURE 036/11/2448 . Type II or unspecified type diabetes mellitus without mention of complication, uncontrolled 10/08/2009  . DYSLIPIDEMIA 10/08/2009  . HYPERTENSION 10/08/2009  . CAD 10/08/2009  . CAROTID STENOSIS 10/08/2009    Medications Prior to Visit Current Outpatient Prescriptions on File Prior to Visit  Medication Sig Dispense Refill  . albuterol (PROVENTIL HFA;VENTOLIN HFA) 108 (90 BASE) MCG/ACT inhaler Inhale 2 puffs into the lungs every 6 (six) hours as needed for wheezing or shortness of breath.  1 Inhaler  2  . amLODipine (NORVASC) 5 MG tablet TAKE 1 & 1/2 TABLETS BY MOUTH ONCE DAILY.  45 tablet  3  . Artificial Tear Ointment (DRY EYES OP) Apply 1 drop to eye daily as needed. For dry eye/ red eye       . aspirin 81 MG EC tablet Take 81 mg by mouth at bedtime.       .Marland Kitchenatorvastatin (LIPITOR) 20 MG tablet Take 1 tablet (20 mg total) by mouth daily.  30 tablet  3  . clopidogrel (PLAVIX) 75 MG tablet Take 1 tablet (75 mg total) by mouth daily.  30 tablet  3  . EASY TOUCH  INSULIN SYRINGE 31G X 5/16" 1 ML MISC USE AS DIRECTED.  80 each  0  . esomeprazole (NEXIUM) 40 MG capsule TAKE 1 CAPSULE ONCE DAILY FOR ACID REFLUX.  30 capsule  3  . furosemide (LASIX) 20 MG tablet Take 20 mg by mouth daily as needed.      .Marland Kitchenglucose blood (ACCU-CHEK AVIVA) test strip Use to check blood glucose twice a day.  Dx: 250.03 uncontrolled type 2 dm treated with insulin  200 each  3  . insulin NPH Human (HUMULIN N) 100 UNIT/ML injection Inject 60 units each morning with breakfast and 35 units each evening with supper.  40 mL  5  . lisinopril (PRINIVIL,ZESTRIL) 5 MG tablet TAKE 1 TABLET ONCE DAILY.  30 tablet  3  . LORazepam (ATIVAN) 0.5 MG tablet TAKE 1 TABLET BY MOUTH 2 TIMES DAILY, as needed  60 tablet  1  . meclizine (ANTIVERT) 25 MG tablet TAKE 1 TABLET 2 TIMES A DAY AS NEEDED FOR DIZZINESS.  60 tablet  5  . metFORMIN (GLUCOPHAGE XR) 500 MG 24 hr tablet Take 1 tablet (500 mg total) by mouth daily with breakfast.  30 tablet  1  . metoprolol (LOPRESSOR) 50 MG tablet Take 1 tablet (50 mg total) by mouth  2 (two) times daily.  60 tablet  3  . Multiple Vitamins-Minerals (MULTIVITAMIN PO) Take 1 tablet by mouth daily. MVI with Iron      . nitroGLYCERIN (NITROSTAT) 0.4 MG SL tablet PLACE ONE (1) TABLET UNDER TONGUE EVERY 5 MINUTES UP TO (3) DOSES AS NEEDED FOR CHEST PAIN.  30 tablet  0  . PARoxetine (PAXIL) 20 MG tablet Take 20 mg by mouth daily.      . hydrOXYzine (ATARAX/VISTARIL) 25 MG tablet TAKE (1) TABLET BY MOUTH THREE TIMES DAILY AS NEEDED.  30 tablet  2   No current facility-administered medications on file prior to visit.    Current Medications (verified) Current Outpatient Prescriptions  Medication Sig Dispense Refill  . albuterol (PROVENTIL HFA;VENTOLIN HFA) 108 (90 BASE) MCG/ACT inhaler Inhale 2 puffs into the lungs every 6 (six) hours as needed for wheezing or shortness of breath.  1 Inhaler  2  . amLODipine (NORVASC) 5 MG tablet TAKE 1 & 1/2 TABLETS BY MOUTH ONCE DAILY.  45  tablet  3  . Artificial Tear Ointment (DRY EYES OP) Apply 1 drop to eye daily as needed. For dry eye/ red eye       . aspirin 81 MG EC tablet Take 81 mg by mouth at bedtime.       Marland Kitchen atorvastatin (LIPITOR) 20 MG tablet Take 1 tablet (20 mg total) by mouth daily.  30 tablet  3  . clopidogrel (PLAVIX) 75 MG tablet Take 1 tablet (75 mg total) by mouth daily.  30 tablet  3  . EASY TOUCH INSULIN SYRINGE 31G X 5/16" 1 ML MISC USE AS DIRECTED.  80 each  0  . esomeprazole (NEXIUM) 40 MG capsule TAKE 1 CAPSULE ONCE DAILY FOR ACID REFLUX.  30 capsule  3  . furosemide (LASIX) 20 MG tablet Take 20 mg by mouth daily as needed.      Marland Kitchen glucose blood (ACCU-CHEK AVIVA) test strip Use to check blood glucose twice a day.  Dx: 250.03 uncontrolled type 2 dm treated with insulin  200 each  3  . insulin NPH Human (HUMULIN N) 100 UNIT/ML injection Inject 60 units each morning with breakfast and 35 units each evening with supper.  40 mL  5  . lisinopril (PRINIVIL,ZESTRIL) 5 MG tablet TAKE 1 TABLET ONCE DAILY.  30 tablet  3  . LORazepam (ATIVAN) 0.5 MG tablet TAKE 1 TABLET BY MOUTH 2 TIMES DAILY, as needed  60 tablet  1  . meclizine (ANTIVERT) 25 MG tablet TAKE 1 TABLET 2 TIMES A DAY AS NEEDED FOR DIZZINESS.  60 tablet  5  . metFORMIN (GLUCOPHAGE XR) 500 MG 24 hr tablet Take 1 tablet (500 mg total) by mouth daily with breakfast.  30 tablet  1  . metoprolol (LOPRESSOR) 50 MG tablet Take 1 tablet (50 mg total) by mouth 2 (two) times daily.  60 tablet  3  . Multiple Vitamins-Minerals (MULTIVITAMIN PO) Take 1 tablet by mouth daily. MVI with Iron      . nitroGLYCERIN (NITROSTAT) 0.4 MG SL tablet PLACE ONE (1) TABLET UNDER TONGUE EVERY 5 MINUTES UP TO (3) DOSES AS NEEDED FOR CHEST PAIN.  30 tablet  0  . PARoxetine (PAXIL) 20 MG tablet Take 20 mg by mouth daily.      . hydrOXYzine (ATARAX/VISTARIL) 25 MG tablet TAKE (1) TABLET BY MOUTH THREE TIMES DAILY AS NEEDED.  30 tablet  2   No current facility-administered medications for  this visit.  Allergies (verified) Propoxyphene n-acetaminophen   PAST HISTORY  Family History Family History  Problem Relation Age of Onset  . Cancer Brother     porstate  . Early death Sister   . Heart disease Brother   . Heart disease Brother   . Heart attack Brother   . Heart disease Brother   . Heart attack Brother   . Diabetes Sister   . Heart attack Sister   . Diabetes Sister   . Osteoporosis Sister   . Hypertension Sister     Social History History  Substance Use Topics  . Smoking status: Never Smoker   . Smokeless tobacco: Never Used  . Alcohol Use: No     Are there smokers in your home (other than you)? No  Risk Factors Current exercise habits: The patient does not participate in regular exercise at present.  Dietary issues discussed: limiting CHO - patient has improved greatly in increasing vegetables, and no sodas   Cardiac risk factors: advanced age (older than 15 for men, 7 for women), diabetes mellitus, dyslipidemia, family history of premature cardiovascular disease, hypertension, obesity (BMI >= 30 kg/m2) and sedentary lifestyle.  Depression Screen - attending prayer group twice weekly and participating in local church ministry call "[employer]" (Note: if answer to either of the following is "Yes", a more complete depression screening is indicated)   Over the past 2 weeks, have you felt down, depressed or hopeless? Yes  Over the past 2 weeks, have you felt little interest or pleasure in doing things? No  Have you lost interest or pleasure in daily life? No  Do you often feel hopeless? No  Do you cry easily over simple problems? No  Activities of Daily Living In your present state of health, do you have any difficulty performing the following activities?:  Driving? Yes  Managing money?  No Feeding yourself? No Getting from bed to chair? No  Climbing a flight of stairs? Yes Preparing food and eating?: No Bathing or showering? No Getting  dressed: No Getting to the toilet? No Using the toilet:No Moving around from place to place: No In the past year have you fallen or had a near fall?:Yes   Are you sexually active?  No  Do you have more than one partner?  No  Hearing Difficulties: Yes Do you often ask people to speak up or repeat themselves? Yes Do you experience ringing or noises in your ears? No Do you have difficulty understanding soft or whispered voices? No   Do you feel that you have a problem with memory? No  Do you often misplace items? No  Do you feel safe at home?  Yes  Cognitive Testing  Alert? Yes  Normal Appearance?Yes  Oriented to person? Yes  Place? Yes   Time? Yes  Recall of three objects?  Yes  Can perform simple calculations? Yes  Displays appropriate judgment?Yes  Can read the correct time from a watch face?Yes   Advanced Directives have been discussed with the patient? Yes  List the Names of Other Physician/Practitioners you currently use: 1.  Hocherin - cardiologist 2.  Irving Shows - podiatrist 3.  Leary Roca - eye  Indicate any recent Medical Services you may have received from other than [facility] providers in the past year (date may be approximate).   There is no immunization history on file for this patient.  Screening Tests Health Maintenance  Topic Date Due  . Foot Exam  10/21/1941  . Ophthalmology Exam  10/21/1941  .  Influenza Vaccine  07/07/2014  . Mammogram  09/15/2014 (Originally 06/05/2014)  . Pneumococcal Polysaccharide Vaccine Age 17 And Over  12/16/2014 (Originally 10/20/1998)  . Zostavax  12/16/2014 (Originally 10/20/1993)  . Tetanus/tdap  12/16/2014 (Originally 10/20/1952)  . Hemoglobin A1c  12/16/2014  . Urine Microalbumin  06/16/2015  . Colonoscopy  12/07/2018    All answers were reviewed with the patient and necessary referrals were made:  Cherre Robins, [facility]   07/13/2014   History reviewed: allergies, current medications, past family history, past medical  history, past social history, past surgical history and problem list     Objective:    Body mass index is 31.65 kg/(m^2). BP 126/64  Pulse 67  Ht _0  (1.676 m)  Wt 196 lb (88.905 kg)  BMI 31.65 kg/m2   Assessment:    Initial Medicare Wellness Visit Type 2 DM - insulin treated improving but not at goals      Plan:     During the course of the visit the patient was educated and counseled about appropriate screening and preventive services including:    Pneumococcal vaccine - patient declined  Influenza vaccine  Hepatitis B vaccine  Td vaccine - patient declined  Zostavax - patient declined  Screening mammography -has appt  Screening Pap smear and pelvic exam   Bone densitometry screening - ordered  Colorectal cancer screening  Diabetic food exam performed today and educated about proper foot care  Glaucoma screening - has appt with Dr Iona Hansen in a few weeks  Nutrition counseling - continue to limit CHO serving sizes and increase non starchy vegetables.  Advanced directives: interested in advanced directives - caring connections packet given  Change NPH insulin to 65 units qam and 30 units qpm Orders Placed This Encounter  Procedures  . BMP8+EGFR    Diet review for nutrition referral? Pt declined_  Patient Instructions (the written plan) was given to the patient.  Medicare Attestation I have personally reviewed: The patient's medical and social history Their use of alcohol, tobacco or illicit drugs Their current medications and supplements The patient's functional ability including ADLs,fall risks, home safety risks, cognitive, and hearing and visual impairment Diet and physical activities Evidence for depression or mood disorders  The patient's weight, height, BMI, and BP/HR have been recorded in the chart.  I have made referrals, counseling, and provided education to the patient based on review of the above and I have provided the patient with a  written personalized care plan for preventive services.     Cherre Robins, [facility]   07/13/2014

## 2014-07-13 NOTE — Patient Instructions (Signed)
Health Maintenance Summary    FOOT EXAM        OPHTHALMOLOGY EXAM Overdue 10/21/1941  Has appt with Dr Iona Hansen - 08/2014    INFLUENZA VACCINE Next Due 07/07/2014  Due in Fall    MAMMOGRAM Postponed 09/15/2014 Has appt for 07/2014    PNEUMOCOCCAL POLYSACCHARIDE VACCINE AGE 78 AND OVER Due Now   Patient Declined - Covered 100% by medicare    ZOSTAVAX Due Now    Patient Declined - cost verified today $3.60    TETANUS/TDAP Due Now   Patient Declined - cost verified today $3.60    HEMOGLOBIN A1C Next Due 12/16/2014      URINE MICROALBUMIN Next Due 06/16/2015      COLONOSCOPY Next Due 12/07/2018  Last was 12/07/2008      Change NPH insulin to 65 units with breakfast and 30 units with supper.       Preventive Care for Adults A healthy lifestyle and preventive care can promote health and wellness. Preventive health guidelines for women include the following key practices.  A routine yearly physical is a good way to check with your health care provider about your health and preventive screening. It is a chance to share any concerns and updates on your health and to receive a thorough exam.  Visit your dentist for a routine exam and preventive care every 6 months. Brush your teeth twice a day and floss once a day. Good oral hygiene prevents tooth decay and gum disease.  The frequency of eye exams is based on your age, health, family medical history, use of contact lenses, and other factors. Follow your health care provider's recommendations for frequency of eye exams.  Eat a healthy diet. Foods like vegetables, fruits, whole grains, low-fat dairy products, and lean protein foods contain the nutrients you need without too many calories. Decrease your intake of foods high in solid fats, added sugars, and salt. Eat the right amount of calories for you.Get information about a proper diet from your health care provider, if necessary.  Regular physical exercise is one of the most important things you can do  for your health. Most adults should get at least 150 minutes of moderate-intensity exercise (any activity that increases your heart rate and causes you to sweat) each week. In addition, most adults need muscle-strengthening exercises on 2 or more days a week.  Maintain a healthy weight. The body mass index (BMI) is a screening tool to identify possible weight problems. It provides an estimate of body fat based on height and weight. Your health care provider can find your BMI and can help you achieve or maintain a healthy weight.For adults 20 years and older:  A BMI below 18.5 is considered underweight.  A BMI of 18.5 to 24.9 is normal.  A BMI of 25 to 29.9 is considered overweight.  A BMI of 30 and above is considered obese.  Maintain normal blood lipids and cholesterol levels by exercising and minimizing your intake of saturated fat. Eat a balanced diet with plenty of fruit and vegetables. Blood tests for lipids and cholesterol should begin at age 39 and be repeated every 5 years. If your lipid or cholesterol levels are high, you are over 50, or you are at high risk for heart disease, you may need your cholesterol levels checked more frequently.Ongoing high lipid and cholesterol levels should be treated with medicines if diet and exercise are not working.  If you smoke, find out from your health care provider how  to quit. If you do not use tobacco, do not start.  Lung cancer screening is recommended for adults aged 12-80 years who are at high risk for developing lung cancer because of a history of smoking. A yearly low-dose CT scan of the lungs is recommended for people who have at least a 30-pack-year history of smoking and are a current smoker or have quit within the past 15 years. A pack year of smoking is smoking an average of 1 pack of cigarettes a day for 1 year (for example: 1 pack a day for 30 years or 2 packs a day for 15 years). Yearly screening should continue until the smoker has  stopped smoking for at least 15 years. Yearly screening should be stopped for people who develop a health problem that would prevent them from having lung cancer treatment.  If you are pregnant, do not drink alcohol. If you are breastfeeding, be very cautious about drinking alcohol. If you are not pregnant and choose to drink alcohol, do not have more than 1 drink per day. One drink is considered to be 12 ounces (355 mL) of beer, 5 ounces (148 mL) of wine, or 1.5 ounces (44 mL) of liquor.  Avoid use of street drugs. Do not share needles with anyone. Ask for help if you need support or instructions about stopping the use of drugs.  High blood pressure causes heart disease and increases the risk of stroke. Your blood pressure should be checked at least every 1 to 2 years. Ongoing high blood pressure should be treated with medicines if weight loss and exercise do not work.  If you are 75-77 years old, ask your health care provider if you should take aspirin to prevent strokes.  Diabetes screening involves taking a blood sample to check your fasting blood sugar level. This should be done once every 3 years, after age 59, if you are within normal weight and without risk factors for diabetes. Testing should be considered at a younger age or be carried out more frequently if you are overweight and have at least 1 risk factor for diabetes.  Breast cancer screening is essential preventive care for women. You should practice "breast self-awareness." This means understanding the normal appearance and feel of your breasts and may include breast self-examination. Any changes detected, no matter how small, should be reported to a health care provider. Women in their 33s and 30s should have a clinical breast exam (CBE) by a health care provider as part of a regular health exam every 1 to 3 years. After age 79, women should have a CBE every year. Starting at age 49, women should consider having a mammogram (breast X-ray  test) every year. Women who have a family history of breast cancer should talk to their health care provider about genetic screening. Women at a high risk of breast cancer should talk to their health care providers about having an MRI and a mammogram every year.  Breast cancer gene (BRCA)-related cancer risk assessment is recommended for women who have family members with BRCA-related cancers. BRCA-related cancers include breast, ovarian, tubal, and peritoneal cancers. Having family members with these cancers may be associated with an increased risk for harmful changes (mutations) in the breast cancer genes BRCA1 and BRCA2. Results of the assessment will determine the need for genetic counseling and BRCA1 and BRCA2 testing.  Routine pelvic exams to screen for cancer are no longer recommended for nonpregnant women who are considered low risk for cancer of the  pelvic organs (ovaries, uterus, and vagina) and who do not have symptoms. Ask your health care provider if a screening pelvic exam is right for you.  If you have had past treatment for cervical cancer or a condition that could lead to cancer, you need Pap tests and screening for cancer for at least 20 years after your treatment. If Pap tests have been discontinued, your risk factors (such as having a new sexual partner) need to be reassessed to determine if screening should be resumed. Some women have medical problems that increase the chance of getting cervical cancer. In these cases, your health care provider may recommend more frequent screening and Pap tests.  The HPV test is an additional test that may be used for cervical cancer screening. The HPV test looks for the virus that can cause the cell changes on the cervix. The cells collected during the Pap test can be tested for HPV. The HPV test could be used to screen women aged 42 years and older, and should be used in women of any age who have unclear Pap test results. After the age of 51, women  should have HPV testing at the same frequency as a Pap test.  Colorectal cancer can be detected and often prevented. Most routine colorectal cancer screening begins at the age of 58 years and continues through age 27 years. However, your health care provider may recommend screening at an earlier age if you have risk factors for colon cancer. On a yearly basis, your health care provider may provide home test kits to check for hidden blood in the stool. Use of a small camera at the end of a tube, to directly examine the colon (sigmoidoscopy or colonoscopy), can detect the earliest forms of colorectal cancer. Talk to your health care provider about this at age 79, when routine screening begins. Direct exam of the colon should be repeated every 5-10 years through age 52 years, unless early forms of pre-cancerous polyps or small growths are found.  People who are at an increased risk for hepatitis B should be screened for this virus. You are considered at high risk for hepatitis B if:  You were born in a country where hepatitis B occurs often. Talk with your health care provider about which countries are considered high risk.  Your parents were born in a high-risk country and you have not received a shot to protect against hepatitis B (hepatitis B vaccine).  You have HIV or AIDS.  You use needles to inject street drugs.  You live with, or have sex with, someone who has hepatitis B.  You get hemodialysis treatment.  You take certain medicines for conditions like cancer, organ transplantation, and autoimmune conditions.  Hepatitis C blood testing is recommended for all people born from [birth year] through 1965 and any individual with known risks for hepatitis C.  Practice safe sex. Use condoms and avoid high-risk sexual practices to reduce the spread of sexually transmitted infections (STIs). STIs include gonorrhea, chlamydia, syphilis, trichomonas, herpes, HPV, and human immunodeficiency virus (HIV).  Herpes, HIV, and HPV are viral illnesses that have no cure. They can result in disability, cancer, and death.  You should be screened for sexually transmitted illnesses (STIs) including gonorrhea and chlamydia if:  You are sexually active and are younger than 24 years.  You are older than 24 years and your health care provider tells you that you are at risk for this type of infection.  Your sexual activity has changed since you  were last screened and you are at an increased risk for chlamydia or gonorrhea. Ask your health care provider if you are at risk.  If you are at risk of being infected with HIV, it is recommended that you take a prescription medicine daily to prevent HIV infection. This is called preexposure prophylaxis (PrEP). You are considered at risk if:  You are a heterosexual woman, are sexually active, and are at increased risk for HIV infection.  You take drugs by injection.  You are sexually active with a partner who has HIV.  Talk with your health care provider about whether you are at high risk of being infected with HIV. If you choose to begin PrEP, you should first be tested for HIV. You should then be tested every 3 months for as long as you are taking PrEP.  Osteoporosis is a disease in which the bones lose minerals and strength with aging. This can result in serious bone fractures or breaks. The risk of osteoporosis can be identified using a bone density scan. Women ages 39 years and over and women at risk for fractures or osteoporosis should discuss screening with their health care providers. Ask your health care provider whether you should take a calcium supplement or vitamin D to reduce the rate of osteoporosis.  Menopause can be associated with physical symptoms and risks. Hormone replacement therapy is available to decrease symptoms and risks. You should talk to your health care provider about whether hormone replacement therapy is right for you.  Use sunscreen.  Apply sunscreen liberally and repeatedly throughout the day. You should seek shade when your shadow is shorter than you. Protect yourself by wearing long sleeves, pants, a wide-brimmed hat, and sunglasses year round, whenever you are outdoors.  Once a month, do a whole body skin exam, using a mirror to look at the skin on your back. Tell your health care provider of new moles, moles that have irregular borders, moles that are larger than a pencil eraser, or moles that have changed in shape or color.  Stay current with required vaccines (immunizations).  Influenza vaccine. All adults should be immunized every year.  Tetanus, diphtheria, and acellular pertussis (Td, Tdap) vaccine. Pregnant women should receive 1 dose of Tdap vaccine during each pregnancy. The dose should be obtained regardless of the length of time since the last dose. Immunization is preferred during the 27th-36th week of gestation. An adult who has not previously received Tdap or who does not know her vaccine status should receive 1 dose of Tdap. This initial dose should be followed by tetanus and diphtheria toxoids (Td) booster doses every 10 years. Adults with an unknown or incomplete history of completing a 3-dose immunization series with Td-containing vaccines should begin or complete a primary immunization series including a Tdap dose. Adults should receive a Td booster every 10 years.  Varicella vaccine. An adult without evidence of immunity to varicella should receive 2 doses or a second dose if she has previously received 1 dose. Pregnant females who do not have evidence of immunity should receive the first dose after pregnancy. This first dose should be obtained before leaving the health care facility. The second dose should be obtained 4-8 weeks after the first dose.  Human papillomavirus (HPV) vaccine. Females aged 13-26 years who have not received the vaccine previously should obtain the 3-dose series. The vaccine is not  recommended for use in pregnant females. However, pregnancy testing is not needed before receiving a dose. If a female  is found to be pregnant after receiving a dose, no treatment is needed. In that case, the remaining doses should be delayed until after the pregnancy. Immunization is recommended for any person with an immunocompromised condition through the age of 87 years if she did not get any or all doses earlier. During the 3-dose series, the second dose should be obtained 4-8 weeks after the first dose. The third dose should be obtained 24 weeks after the first dose and 16 weeks after the second dose.  Zoster vaccine. One dose is recommended for adults aged 33 years or older unless certain conditions are present.  Measles, mumps, and rubella (MMR) vaccine. Adults born before [birth year] generally are considered immune to measles and mumps. Adults born in [birth year] or later should have 1 or more doses of MMR vaccine unless there is a contraindication to the vaccine or there is laboratory evidence of immunity to each of the three diseases. A routine second dose of MMR vaccine should be obtained at least 28 days after the first dose for students attending postsecondary schools, health care workers, or international travelers. People who received inactivated measles vaccine or an unknown type of measles vaccine during 1963-1967 should receive 2 doses of MMR vaccine. People who received inactivated mumps vaccine or an unknown type of mumps vaccine before 1979 and are at high risk for mumps infection should consider immunization with 2 doses of MMR vaccine. For females of childbearing age, rubella immunity should be determined. If there is no evidence of immunity, females who are not pregnant should be vaccinated. If there is no evidence of immunity, females who are pregnant should delay immunization until after pregnancy. Unvaccinated health care workers born before [birth year] who lack laboratory evidence of measles, mumps, or  rubella immunity or laboratory confirmation of disease should consider measles and mumps immunization with 2 doses of MMR vaccine or rubella immunization with 1 dose of MMR vaccine.  Pneumococcal 13-valent conjugate (PCV13) vaccine. When indicated, a person who is uncertain of her immunization history and has no record of immunization should receive the PCV13 vaccine. An adult aged 42 years or older who has certain medical conditions and has not been previously immunized should receive 1 dose of PCV13 vaccine. This PCV13 should be followed with a dose of pneumococcal polysaccharide (PPSV23) vaccine. The PPSV23 vaccine dose should be obtained at least 8 weeks after the dose of PCV13 vaccine. An adult aged 35 years or older who has certain medical conditions and previously received 1 or more doses of PPSV23 vaccine should receive 1 dose of PCV13. The PCV13 vaccine dose should be obtained 1 or more years after the last PPSV23 vaccine dose.  Pneumococcal polysaccharide (PPSV23) vaccine. When PCV13 is also indicated, PCV13 should be obtained first. All adults aged 62 years and older should be immunized. An adult younger than age 80 years who has certain medical conditions should be immunized. Any person who resides in a nursing home or long-term care facility should be immunized. An adult smoker should be immunized. People with an immunocompromised condition and certain other conditions should receive both PCV13 and PPSV23 vaccines. People with human immunodeficiency virus (HIV) infection should be immunized as soon as possible after diagnosis. Immunization during chemotherapy or radiation therapy should be avoided. Routine use of PPSV23 vaccine is not recommended for American Indians, [place] Natives, or people younger than 65 years unless there are medical conditions that require PPSV23 vaccine. When indicated, people who have unknown immunization and have no record of  immunization should receive PPSV23 vaccine.  One-time revaccination 5 years after the first dose of PPSV23 is recommended for people aged 19-64 years who have chronic kidney failure, nephrotic syndrome, asplenia, or immunocompromised conditions. People who received 1-2 doses of PPSV23 before age 34 years should receive another dose of PPSV23 vaccine at age 87 years or later if at least 5 years have passed since the previous dose. Doses of PPSV23 are not needed for people immunized with PPSV23 at or after age 1 years.  Meningococcal vaccine. Adults with asplenia or persistent complement component deficiencies should receive 2 doses of quadrivalent meningococcal conjugate (MenACWY-D) vaccine. The doses should be obtained at least 2 months apart. Microbiologists working with certain meningococcal bacteria, [employer] recruits, people at risk during an outbreak, and people who travel to or live in countries with a high rate of meningitis should be immunized. A first-year college student up through age 36 years who is living in a residence hall should receive a dose if she did not receive a dose on or after her 16th birthday. Adults who have certain high-risk conditions should receive one or more doses of vaccine.  Hepatitis A vaccine. Adults who wish to be protected from this disease, have certain high-risk conditions, work with hepatitis A-infected animals, work in hepatitis A research labs, or travel to or work in countries with a high rate of hepatitis A should be immunized. Adults who were previously unvaccinated and who anticipate close contact with an international adoptee during the first 60 days after arrival in the [place] from a country with a high rate of hepatitis A should be immunized.  Hepatitis B vaccine. Adults who wish to be protected from this disease, have certain high-risk conditions, may be exposed to blood or other infectious body fluids, are household contacts or sex partners of hepatitis B positive people, are clients or workers  in certain care facilities, or travel to or work in countries with a high rate of hepatitis B should be immunized.  Haemophilus influenzae type b (Hib) vaccine. A previously unvaccinated person with asplenia or sickle cell disease or having a scheduled splenectomy should receive 1 dose of Hib vaccine. Regardless of previous immunization, a recipient of a hematopoietic stem cell transplant should receive a 3-dose series 6-12 months after her successful transplant. Hib vaccine is not recommended for adults with HIV infection.  Age 2 and Older  Blood pressure check.** / Every 1 to 2 years.  Lipid and cholesterol check.** / Every 5 years beginning at age 71 years.  Lung cancer screening. / Every year if you are aged 84-80 years and have a 30-pack-year history of smoking and currently smoke or have quit within the past 15 years. Yearly screening is stopped once you have quit smoking for at least 15 years or develop a health problem that would prevent you from having lung cancer treatment.  Clinical breast exam.** / Every year after age 36 years.  Mammogram.** / Every year beginning at age 97 years and continuing for as long as you are in good health. Consult with your health care provider.  Pap test.** / Every 3 years starting at age 83 years through age 87 or 46 years with 3 consecutive normal Pap tests. Testing can be stopped between 65 and 70 years with 3 consecutive normal Pap tests and no abnormal Pap or HPV tests in the past 10 years.  HPV screening.** / Every 3 years from ages 15 years through ages 35 or 20 years with  a history of 3 consecutive normal Pap tests. Testing can be stopped between 65 and 70 years with 3 consecutive normal Pap tests and no abnormal Pap or HPV tests in the past 10 years.  Fecal occult blood test (FOBT) of stool. / Every year beginning at age 24 years and continuing until age 45 years. You may not need to do this test if you get a colonoscopy every 10  years.  Flexible sigmoidoscopy or colonoscopy.** / Every 5 years for a flexible sigmoidoscopy or every 10 years for a colonoscopy beginning at age 21 years and continuing until age 44 years.  Hepatitis C blood test.** / For all people born from [birth year] through 1963 and any individual with known risks for hepatitis C.  Osteoporosis screening.** / A one-time screening for women ages 39 years and over and women at risk for fractures or osteoporosis.  Skin self-exam. / Monthly.  Influenza vaccine. / Every year.  Tetanus, diphtheria, and acellular pertussis (Tdap/Td) vaccine.** / 1 dose of Td every 10 years.  Varicella vaccine.** / Consult your health care provider.  Zoster vaccine.** / 1 dose for adults aged 65 years or older.  Pneumococcal 13-valent conjugate (PCV13) vaccine.** / Consult your health care provider.  Pneumococcal polysaccharide (PPSV23) vaccine.** / 1 dose for all adults aged 85 years and older.  Meningococcal vaccine.** / Consult your health care provider.  Hepatitis A vaccine.** / Consult your health care provider.  Hepatitis B vaccine.** / Consult your health care provider.  Haemophilus influenzae type b (Hib) vaccine.** / Consult your health care provider. ** Family history and personal history of risk and conditions may change your health care provider's recommendations. Document Released: 01/19/2002 Document Revised: 04/09/2014 Document Reviewed: 04/20/2011 [facility] Patient Information 2015 [place], [place]. This information is not intended to replace advice given to you by your health care provider. Make sure you discuss any questions you have with your health care provider.

## 2014-07-14 LAB — BMP8+EGFR
BUN/Creatinine Ratio: 20 (ref 11–26)
BUN: 21 mg/dL (ref 8–27)
CALCIUM: 9.9 mg/dL (ref 8.7–10.3)
CO2: 24 mmol/L (ref 18–29)
CREATININE: 1.05 mg/dL — AB (ref 0.57–1.00)
Chloride: 102 mmol/L (ref 97–108)
GFR calc Af Amer: 58 mL/min/{1.73_m2} — ABNORMAL LOW (ref >59)
GFR calc non Af Amer: 50 mL/min/{1.73_m2} — ABNORMAL LOW (ref >59)
Glucose: 208 mg/dL — ABNORMAL HIGH (ref 65–99)
Potassium: 4.9 mmol/L (ref 3.5–5.2)
SODIUM: 139 mmol/L (ref 134–144)

## 2014-07-19 ENCOUNTER — Telehealth: Payer: Self-pay | Admitting: Pharmacist

## 2014-07-19 MED ORDER — METFORMIN HCL ER 500 MG PO TB24: ORAL_TABLET | ORAL | Status: DC

## 2014-07-19 NOTE — Telephone Encounter (Signed)
Serum creatinine stable.  Recommend increase metformin to 500mg  BID Recheck BMET 2 weeks.

## 2014-07-23 ENCOUNTER — Telehealth: Payer: Self-pay | Admitting: Nurse Practitioner

## 2014-07-23 MED ORDER — METFORMIN HCL ER 500 MG PO TB24: ORAL_TABLET | ORAL | Status: DC

## 2014-07-23 NOTE — Telephone Encounter (Signed)
[name] do i need to increase the xr to BID or change to plain metformin?

## 2014-07-23 NOTE — Telephone Encounter (Signed)
[facility]19/[facility] - see [facility] record.  I recommend continue XR as it is less likely to cause diarrhea.  I will resend - [name] aware.

## 2014-08-01 ENCOUNTER — Encounter: Payer: Self-pay | Admitting: Pharmacist

## 2014-08-01 ENCOUNTER — Ambulatory Visit (INDEPENDENT_AMBULATORY_CARE_PROVIDER_SITE_OTHER): Payer: Medicare Other

## 2014-08-01 ENCOUNTER — Ambulatory Visit (INDEPENDENT_AMBULATORY_CARE_PROVIDER_SITE_OTHER): Payer: Medicare Other | Admitting: Pharmacist

## 2014-08-01 VITALS — Ht 66.0 in | Wt 196.0 lb

## 2014-08-01 DIAGNOSIS — M949 Disorder of cartilage, unspecified: Secondary | ICD-10-CM

## 2014-08-01 DIAGNOSIS — M8580 Other specified disorders of bone density and structure, unspecified site: Secondary | ICD-10-CM

## 2014-08-01 DIAGNOSIS — M899 Disorder of bone, unspecified: Secondary | ICD-10-CM

## 2014-08-01 NOTE — Patient Instructions (Signed)

## 2014-08-01 NOTE — Progress Notes (Signed)
[name], female   DOB: 29-Apr-1931, 80 y.o.   MRN: [id number]  [facility] Current Height: Height: _0  (167.6 cm)      Max Lifetime Height:  5' 6 " Current Weight: Weight: 196 lb (88.905 kg)       Ethnicity:Caucasian   HPI: Does pt already have a diagnosis of:  Osteopenia?  Yes Osteoporosis?  No She also has history of stoke X3 with resulting dysarthria  Back Pain?  Yes       Kyphosis?  No Prior fracture?  Yes - leg, ankle, shoulder Med(s) for Osteoporosis/Osteopenia:  none Med(s) previously tried for Osteoporosis/Osteopenia:  none                                                             PMH: Age at menopause:  80yo Hysterectomy?  Yes Oophorectomy?  Yes HRT? Yes - Former.  Type/duration: premarin for about 2 years Steroid Use?  No Thyroid med?  No History of cancer?  Yes - ovarian ca, endometrial History of digestive disorders (ie Crohn's)?  Yes - GERD; per patient she has a history of GI ulcer but unable to find documentation of this.  Current or previous eating disorders?  No Last Vitamin D Result:  35 (05/2013) Last GFR Result:  50 (07/13/2014)   FH/SH: Family history of osteoporosis?  Yes - sister Parent with history of hip fracture?  No Family history of breast cancer?  No Exercise?  No Smoking?  No Alcohol?  No    Calcium Assessment Calcium Intake  # of servings/day  Calcium mg  Milk (8 oz) 0.5  x  300  = 129m  Yogurt (4 oz) 0 x  200 = 0  Cheese (1 oz) 1 x  200 = 2058m Other Calcium sources   25055mCa supplement MVI daily = 500m23mEstimated calcium intake per day 1100mg12mDEXA Results Date of Test T-Score for AP Spine L1-L4 T-Score for Total Left Hip T-Score for Total Right Hip  08/01/2014 -1.0 -0.8 -0.6  09/23/2011 -0.8 -0.6 -0.7  07/18/2008 -1.1 -0.3 -0.1  05/31/2006 -1.4 -0.2 0.0   Lowest T-Score = -1.6 at neck of left femur  FRAX 10 year estimate: Total FX risk:  19%  (consider medication if >/= 20%) Hip FX  risk:  4.2%  (consider medication if >/= 3%)  Assessment: Osteopenia - high fracture risk but unable to take oral bisphosphonates due to history of ulcer (per patient) or evista due to stroke history / risk  Recommendations: 1.  Start  Reclast - infused every other year - doing labs prior to referral to check PTH, renal function and Phosphorus 2.  recommend calcium 1200mg 5my through supplementation or diet.  3.  recommend weight bearing exercise - as able  4.  Counseled and educated about fall risk and prevention.  Orders Placed This Encounter  Procedures  . BMP8+EGFR  . Vit D  25 hydroxy (rtn osteoporosis monitoring)  . Phosphorus  . Magnesium  . Parathyroid hormone, intact (no Ca)    Recheck DEXA:  2 years  Time spent counseling patient:  30 minutes  Lamin Chandley Cherre RobinsmD, CPP

## 2014-08-02 LAB — BMP8+EGFR
BUN/Creatinine Ratio: 17 (ref 11–26)
BUN: 22 mg/dL (ref 8–27)
CALCIUM: 9.7 mg/dL (ref 8.7–10.3)
CHLORIDE: 101 mmol/L (ref 97–108)
CO2: 25 mmol/L (ref 18–29)
CREATININE: 1.27 mg/dL — AB (ref 0.57–1.00)
GFR calc Af Amer: 46 mL/min/{1.73_m2} — ABNORMAL LOW (ref >59)
GFR calc non Af Amer: 40 mL/min/{1.73_m2} — ABNORMAL LOW (ref >59)
Glucose: 187 mg/dL — ABNORMAL HIGH (ref 65–99)
Potassium: 5.3 mmol/L — ABNORMAL HIGH (ref 3.5–5.2)
SODIUM: 140 mmol/L (ref 134–144)

## 2014-08-02 LAB — MAGNESIUM: Magnesium: 1.5 mg/dL — ABNORMAL LOW (ref 1.6–2.6)

## 2014-08-02 LAB — VITAMIN D 25 HYDROXY (VIT D DEFICIENCY, FRACTURES): VIT D 25 HYDROXY: 31.7 ng/mL (ref 30.0–100.0)

## 2014-08-02 LAB — PHOSPHORUS: Phosphorus: 3.8 mg/dL (ref 2.5–4.5)

## 2014-08-02 LAB — PARATHYROID HORMONE, INTACT (NO CA): PTH: 32 pg/mL (ref 15–65)

## 2014-08-06 ENCOUNTER — Other Ambulatory Visit: Payer: Self-pay | Admitting: General Practice

## 2014-08-06 ENCOUNTER — Telehealth: Payer: Self-pay | Admitting: Pharmacist

## 2014-08-06 NOTE — Telephone Encounter (Signed)
Serum creatinine increasing, magnesium slightly low and potassium slightly elevated. Recheck in 2 weeks at next appt.  Patient called.

## 2014-08-20 ENCOUNTER — Ambulatory Visit (INDEPENDENT_AMBULATORY_CARE_PROVIDER_SITE_OTHER): Payer: Medicare Other | Admitting: Pharmacist

## 2014-08-20 ENCOUNTER — Encounter: Payer: Self-pay | Admitting: Pharmacist

## 2014-08-20 VITALS — BP 128/75 | HR 70 | Ht 66.0 in | Wt 197.0 lb

## 2014-08-20 DIAGNOSIS — I2510 Atherosclerotic heart disease of native coronary artery without angina pectoris: Secondary | ICD-10-CM

## 2014-08-20 DIAGNOSIS — Z794 Long term (current) use of insulin: Secondary | ICD-10-CM

## 2014-08-20 DIAGNOSIS — R790 Abnormal level of blood mineral: Secondary | ICD-10-CM

## 2014-08-20 DIAGNOSIS — M8580 Other specified disorders of bone density and structure, unspecified site: Secondary | ICD-10-CM

## 2014-08-20 DIAGNOSIS — M949 Disorder of cartilage, unspecified: Secondary | ICD-10-CM

## 2014-08-20 DIAGNOSIS — E785 Hyperlipidemia, unspecified: Secondary | ICD-10-CM

## 2014-08-20 DIAGNOSIS — M899 Disorder of bone, unspecified: Secondary | ICD-10-CM

## 2014-08-20 DIAGNOSIS — E669 Obesity, unspecified: Secondary | ICD-10-CM

## 2014-08-20 DIAGNOSIS — R635 Abnormal weight gain: Secondary | ICD-10-CM

## 2014-08-20 DIAGNOSIS — E119 Type 2 diabetes mellitus without complications: Secondary | ICD-10-CM

## 2014-08-20 NOTE — Progress Notes (Signed)
Diabetes Follow-Up Visit Chief Complaint:   Chief Complaint  Patient presents with    For follow up of type 2 DM and to recheck labs prior to Reclast for osteopenia     Filed Vitals:   08/20/14 0919  BP: 128/75  Pulse: 70    HPI:  Patient with uncontrolled type 2 DM for 13 years and osteopenia.   Her most recent A1C was 9.7%.  Patient did not bring BG monitor so I only have her recollection that BG readings have been 77 to 208.  She usually checks BG once daily at varying time.  She does reports that over the last 2 month she has had less hypoglycemia but did have 2 episodes over the last week which occurred during the night while she was sleeping  Current Diabetes Medications:  Insulin NPH 65 units qam and 30 units qpm and metformin XR 559m qam  Low fat/carbohydrate diet?  Yes - has stopped drinking soft drinks and is trying to limit portion sizes of high CHO foods. Nicotine Abuse?  No Medication Compliance?  Yes Exercise?  No - but has been more active with church and canning lately Alcohol Abuse?  No  At last visit discussed starting Reclast for osteopenia - every other year.  Referral for infusion was postponed due to low magnesium and patient's now has changed her mind and does not want to start Reclast.  Evista is contraindicated due to history of stroke and oral bisphosphonates are contraindicated due to history of GI ulcer.  Exam BMI:  Body mass index is 31.81 kg/(m^2).   Weight changes:  stable General Appearance:  alert, oriented, no acute distress and obese Mood/Affect:  normal   Lab Results  Component Value Date   HGBA1C 9.7 06/15/2014    No results found for this basename:Derl Barrow   Lab Results  Component Value Date   CHOL 143 06/15/2014   HDL 53 06/15/2014   LDLCALC 42 06/15/2014   TRIG 239* 06/15/2014   CHOLHDL 2.5 02/03/2010      Assessment: 1.  Diabetes.  Uncontrolled but much improved 2.  Blood Pressure.  At goal today 3.  Lipids.  Tg  elevated likely due to uncontrolled DM 4.  Osteopenia 5.  Low Magnesium   Recommendations: 1.  Medication recommendations at this time are as follows:    Increase metformn XR 5087mto 1 tablet BID with breakfast and supper  Change insluin NPH to 60 units qam with breakfast and 28 units qpm with supper 2.  Reviewed HBG goals:  Fasting 80-130 and 1-2 hour post prandial <180.  Patient is instructed to check BG 2 times per day.   Stressed to bring glucometer to next appt.   3.  BP goal < 140/85. 4.  LDL goal of < 100, HDL > 40 and TG < 150. 5. Discussed fracture risk - will hold off on starting Reclast at patient and her daughter's request 6.   Orders Placed This Encounter  Procedures  . BMP8+EGFR  . Magnesium    7.  Return to clinic in 4 weeks to see PCP - BiDietrich Patesnd RTC in to see CDE / clinical pharmacist in November  TaCherre RobinsPharmD, CPP, CDE    Time spent counseling patient:  30 minutes

## 2014-08-21 LAB — BMP8+EGFR
BUN/Creatinine Ratio: 20 (ref 11–26)
BUN: 22 mg/dL (ref 8–27)
CO2: 26 mmol/L (ref 18–29)
Calcium: 9.8 mg/dL (ref 8.7–10.3)
Chloride: 99 mmol/L (ref 97–108)
Creatinine, Ser: 1.11 mg/dL — ABNORMAL HIGH (ref 0.57–1.00)
GFR calc Af Amer: 54 mL/min/{1.73_m2} — ABNORMAL LOW (ref >59)
GFR calc non Af Amer: 47 mL/min/{1.73_m2} — ABNORMAL LOW (ref >59)
Glucose: 203 mg/dL — ABNORMAL HIGH (ref 65–99)
Potassium: 4.9 mmol/L (ref 3.5–5.2)
Sodium: 140 mmol/L (ref 134–144)

## 2014-08-21 LAB — MAGNESIUM: Magnesium: 1.7 mg/dL (ref 1.6–2.6)

## 2014-08-22 ENCOUNTER — Telehealth: Payer: Self-pay | Admitting: Pharmacist

## 2014-08-22 NOTE — Telephone Encounter (Signed)
Magnesium was WNL this time.  BG slightly elevated but serum creatinine improved and potassium WNL.

## 2014-08-27 ENCOUNTER — Emergency Department (HOSPITAL_COMMUNITY): Payer: Medicare Other

## 2014-08-27 ENCOUNTER — Encounter (HOSPITAL_COMMUNITY): Payer: Self-pay | Admitting: Emergency Medicine

## 2014-08-27 ENCOUNTER — Emergency Department (HOSPITAL_COMMUNITY)
Admission: EM | Admit: 2014-08-27 | Discharge: 2014-08-27 | Disposition: A | Discharge status: Home / Self Care | Payer: Medicare Other | Attending: Emergency Medicine | Admitting: Emergency Medicine

## 2014-08-27 DIAGNOSIS — Y9301 Activity, walking, marching and hiking: Secondary | ICD-10-CM | POA: Diagnosis not present

## 2014-08-27 DIAGNOSIS — I10 Essential (primary) hypertension: Secondary | ICD-10-CM | POA: Insufficient documentation

## 2014-08-27 DIAGNOSIS — Z79899 Other long term (current) drug therapy: Secondary | ICD-10-CM | POA: Insufficient documentation

## 2014-08-27 DIAGNOSIS — Z7902 Long term (current) use of antithrombotics/antiplatelets: Secondary | ICD-10-CM | POA: Insufficient documentation

## 2014-08-27 DIAGNOSIS — S3981XA Other specified injuries of abdomen, initial encounter: Secondary | ICD-10-CM | POA: Diagnosis not present

## 2014-08-27 DIAGNOSIS — I2510 Atherosclerotic heart disease of native coronary artery without angina pectoris: Secondary | ICD-10-CM | POA: Insufficient documentation

## 2014-08-27 DIAGNOSIS — Y929 Unspecified place or not applicable: Secondary | ICD-10-CM | POA: Diagnosis not present

## 2014-08-27 DIAGNOSIS — Z951 Presence of aortocoronary bypass graft: Secondary | ICD-10-CM | POA: Diagnosis not present

## 2014-08-27 DIAGNOSIS — S298XXA Other specified injuries of thorax, initial encounter: Secondary | ICD-10-CM | POA: Diagnosis present

## 2014-08-27 DIAGNOSIS — Z872 Personal history of diseases of the skin and subcutaneous tissue: Secondary | ICD-10-CM | POA: Insufficient documentation

## 2014-08-27 DIAGNOSIS — Z87448 Personal history of other diseases of urinary system: Secondary | ICD-10-CM | POA: Insufficient documentation

## 2014-08-27 DIAGNOSIS — Z90710 Acquired absence of both cervix and uterus: Secondary | ICD-10-CM | POA: Insufficient documentation

## 2014-08-27 DIAGNOSIS — W010XXA Fall on same level from slipping, tripping and stumbling without subsequent striking against object, initial encounter: Secondary | ICD-10-CM | POA: Diagnosis not present

## 2014-08-27 DIAGNOSIS — S0990XA Unspecified injury of head, initial encounter: Secondary | ICD-10-CM | POA: Diagnosis not present

## 2014-08-27 DIAGNOSIS — E119 Type 2 diabetes mellitus without complications: Secondary | ICD-10-CM | POA: Insufficient documentation

## 2014-08-27 DIAGNOSIS — Z862 Personal history of diseases of the blood and blood-forming organs and certain disorders involving the immune mechanism: Secondary | ICD-10-CM | POA: Insufficient documentation

## 2014-08-27 DIAGNOSIS — Z7982 Long term (current) use of aspirin: Secondary | ICD-10-CM | POA: Insufficient documentation

## 2014-08-27 DIAGNOSIS — Z794 Long term (current) use of insulin: Secondary | ICD-10-CM | POA: Insufficient documentation

## 2014-08-27 DIAGNOSIS — S2249XA Multiple fractures of ribs, unspecified side, initial encounter for closed fracture: Secondary | ICD-10-CM | POA: Diagnosis not present

## 2014-08-27 DIAGNOSIS — Z8673 Personal history of transient ischemic attack (TIA), and cerebral infarction without residual deficits: Secondary | ICD-10-CM | POA: Insufficient documentation

## 2014-08-27 DIAGNOSIS — I509 Heart failure, unspecified: Secondary | ICD-10-CM | POA: Diagnosis not present

## 2014-08-27 DIAGNOSIS — S2231XA Fracture of one rib, right side, initial encounter for closed fracture: Secondary | ICD-10-CM

## 2014-08-27 LAB — I-STAT CHEM 8, ED
BUN: 31 mg/dL — AB (ref 6–23)
CHLORIDE: 106 meq/L (ref 96–112)
Calcium, Ion: 1.22 mmol/L (ref 1.13–1.30)
Creatinine, Ser: 1.4 mg/dL — ABNORMAL HIGH (ref 0.50–1.10)
GLUCOSE: 184 mg/dL — AB (ref 70–99)
HEMATOCRIT: 37 % (ref 36.0–46.0)
Hemoglobin: 12.6 g/dL (ref 12.0–15.0)
POTASSIUM: 5.3 meq/L (ref 3.7–5.3)
Sodium: 136 mEq/L — ABNORMAL LOW (ref 137–147)
TCO2: 27 mmol/L (ref 0–100)

## 2014-08-27 LAB — TROPONIN I: Troponin I: <0.3 ng/mL (ref <0.30)

## 2014-08-27 MED ORDER — FENTANYL CITRATE 0.05 MG/ML IJ SOLN
50.0000 ug | Freq: Once | INTRAMUSCULAR | Status: AC
  Administered 2014-08-27: 50 ug via INTRAVENOUS
  Filled 2014-08-27: qty 2

## 2014-08-27 MED ORDER — HYDROCODONE-ACETAMINOPHEN 5-325 MG PO TABS: 1.0000 | ORAL_TABLET | ORAL | Status: DC | PRN

## 2014-08-27 NOTE — ED Notes (Signed)
Pt states she tripped and fell x 1 hour ago. C/o pain to right rib cage. nad noted.

## 2014-08-27 NOTE — ED Provider Notes (Signed)
CSN: [id number]     Arrival date & time 08/27/14  1747 History   First MD Initiated Contact with Patient 08/27/14 1814     Chief Complaint  Patient presents with  . Fall     (Consider location/radiation/quality/duration/timing/severity/associated sxs/prior Treatment) HPI Comments: Patient complains of right rib pain after mechanical fall one hour ago. She was walking in her trailer when she stumbled and fell onto her right side onto the floor. Denies hitting head or losing consciousness. She has pain in the right ribs it is worse with palpation deep breathing. Denies abdominal pain, head, neck or back pain. She takes Plavix. No Coumadin. History of previous strokes, CABG, diabetes. She walks with a walker at baseline. She also complains of pain to her right wrist that is worse with palpation and movement. No focal weakness, numbness or tingling.  The history is provided by the patient and a caregiver.    Past Medical History  Diagnosis Date  . Anemia   . Diabetes mellitus   . Hypertension   . Iron deficiency anemia 05/27/2011  . CHF (congestive heart failure)     EF preserved Echo 2012  . CVA (cerebral infarction)   . Carotid stenosis   . Renal insufficiency   . Furuncle of back, except buttock   . Vertigo   . CAD (coronary artery disease)    Past Surgical History  Procedure Laterality Date  . Cholecystectomy    . I & d of furuncle  April 2013  . Total abdominal hysterectomy      complete  . Coronary artery bypass graft  2006   Family History  Problem Relation Age of Onset  . Cancer Brother     porstate  . Early death Sister   . Heart disease Brother   . Heart disease Brother   . Heart attack Brother   . Heart disease Brother   . Heart attack Brother   . Diabetes Sister   . Heart attack Sister   . Diabetes Sister   . Osteoporosis Sister   . Hypertension Sister    History  Substance Use Topics  . Smoking status: Never Smoker   . Smokeless tobacco: Never Used  .  Alcohol Use: No   OB History   Grav Para Term Preterm Abortions TAB SAB Ect Mult Living                 Review of Systems  Constitutional: Negative for fever, activity change and appetite change.  HENT: Negative for congestion and rhinorrhea.   Respiratory: Negative for cough, chest tightness and shortness of breath.   Cardiovascular: Negative for chest pain.  Gastrointestinal: Negative for nausea, vomiting and abdominal pain.  Genitourinary: Negative for dysuria, hematuria, vaginal bleeding and vaginal discharge.  Musculoskeletal: Positive for arthralgias and myalgias. Negative for back pain and neck pain.  Skin: Negative for rash.  Neurological: Negative for dizziness, weakness and headaches.  A complete 10 system review of systems was obtained and all systems are negative except as noted in the HPI and PMH.      Allergies  Propoxyphene n-acetaminophen  Home Medications   Prior to Admission medications   Medication Sig Start Date End Date Taking? Authorizing Provider  albuterol (PROVENTIL HFA;VENTOLIN HFA) 108 (90 BASE) MCG/ACT inhaler Inhale 2 puffs into the lungs every 6 (six) hours as needed for wheezing or shortness of breath. 02/16/14  Yes Tiffany Gann, PA-C  amLODipine (NORVASC) 5 MG tablet Take 7.5 mg by mouth daily.  Yes Historical Provider, MD  Artificial Tear Ointment (DRY EYES OP) Apply 1 drop to eye daily as needed. For dry eye/ red eye    Yes Historical Provider, MD  aspirin 81 MG EC tablet Take 81 mg by mouth at bedtime.    Yes Historical Provider, MD  atorvastatin (LIPITOR) 20 MG tablet Take 20 mg by mouth at bedtime.   Yes Historical Provider, MD  clopidogrel (PLAVIX) 75 MG tablet Take 1 tablet (75 mg total) by mouth daily. 03/13/14  Yes Mae Loree Fee, FNP  esomeprazole (NEXIUM) 40 MG capsule Take 40 mg by mouth daily as needed (FO ACID REFLUX/GERD).   Yes Historical Provider, MD  furosemide (LASIX) 20 MG tablet Take 20 mg by mouth daily as needed for fluid or  edema.  03/13/14  Yes Mae E Haliburton, FNP  insulin NPH Human (HUMULIN N) 100 UNIT/ML injection Inject 65 units each morning with breakfast and 28 units each evening with supper. 08/20/14  Yes [name] Eckard, PHARMD  lisinopril (PRINIVIL,ZESTRIL) 5 MG tablet Take 5 mg by mouth daily.   Yes Historical Provider, MD  LORazepam (ATIVAN) 0.5 MG tablet Take 0.5 mg by mouth 2 (two) times daily.   Yes Historical Provider, MD  meclizine (ANTIVERT) 25 MG tablet Take 25 mg by mouth 2 (two) times daily.   Yes Historical Provider, MD  metFORMIN (GLUCOPHAGE XR) 500 MG 24 hr tablet Take 1 tablet with breakfast and 1 tablet with supper 07/23/14  Yes [name] Eckard, PHARMD  metoprolol (LOPRESSOR) 50 MG tablet Take 1 tablet (50 mg total) by mouth 2 (two) times daily. 03/13/14  Yes Mae Loree Fee, FNP  Multiple Vitamins-Iron (DAILY VITAMINS/IRON/BETA CAROT PO) Take 1 tablet by mouth at bedtime.   Yes Historical Provider, MD  nitroGLYCERIN (NITROSTAT) 0.4 MG SL tablet Place 0.4 mg under the tongue every 5 (five) minutes as needed for chest pain.   Yes Historical Provider, MD  PARoxetine (PAXIL) 20 MG tablet Take 20 mg by mouth daily.   Yes Historical Provider, MD  HYDROcodone-acetaminophen (NORCO/VICODIN) 5-325 MG per tablet Take 1 tablet by mouth every 4 (four) hours as needed. 08/27/14   Ezequiel Essex, MD   BP 144/60  Pulse 66  Temp(Src) 98.7 F (37.1 C)  Resp 18  Ht 5\' 6"  (1.676 m)  Wt 196 lb (88.905 kg)  BMI 31.65 kg/m2  SpO2 97% Physical Exam  Nursing note and vitals reviewed. Constitutional: She is oriented to person, place, and time. She appears well-developed and well-nourished. No distress.  Dysarthric speech, baseline per family  HENT:  Head: Normocephalic and atraumatic.  Mouth/Throat: Oropharynx is clear and moist. No oropharyngeal exudate.  Eyes: Conjunctivae and EOM are normal. Pupils are equal, round, and reactive to light.  Neck: Normal range of motion. Neck supple.  No C spine tenderness   Cardiovascular: Normal rate, regular rhythm, normal heart sounds and intact distal pulses.   No murmur heard. Pulmonary/Chest: Effort normal and breath sounds normal. No respiratory distress. She exhibits tenderness.  Tender right lower anterior lateral ribs below breast. No ecchymosis or crepitance  Abdominal: Soft. There is no tenderness. There is no rebound and no guarding.  Musculoskeletal: Normal range of motion. She exhibits no edema and no tenderness.  No T or L spine pain  Neurological: She is alert and oriented to person, place, and time. No cranial nerve deficit. She exhibits normal muscle tone. Coordination normal.  No ataxia on finger to nose bilaterally. No pronator drift. 5/5 strength throughout. CN 2-12 intact.  Negative Romberg. Equal grip strength. Sensation intact. Gait is normal.   Skin: Skin is warm.  Psychiatric: She has a normal mood and affect. Her behavior is normal.    ED Course  Procedures (including critical care time) Labs Review Labs Reviewed  I-STAT CHEM 8, ED - Abnormal; Notable for the following:    Sodium 136 (*)    BUN 31 (*)    Creatinine, Ser 1.40 (*)    Glucose, Bld 184 (*)    All other components within normal limits  TROPONIN I  URINALYSIS, ROUTINE W REFLEX MICROSCOPIC    Imaging Review Ct Abdomen Pelvis Wo Contrast  08/27/2014   CLINICAL DATA:  Tripped and fell now with right rib pain and known right sixth and seventh rib fractures  EXAM: CT CHEST, ABDOMEN AND PELVIS WITHOUT CONTRAST  TECHNIQUE: Multidetector CT imaging of the chest, abdomen and pelvis was performed following the standard protocol without IV contrast.  COMPARISON:  Chest x-ray and right rib series of today's date.  FINDINGS: CT CHEST FINDINGS  The lungs are well-expanded. There is no pneumothorax or pneumomediastinum. There is no pleural effusion. There is a patchy area of density in the posterior inferior aspect of the right upper lobe adjacent to the major fissure demonstrated  on image 28.  The cardiac chambers are normal in size. The patient has undergone previous CABG. There is mitral annular calcification. There is no pericardial effusion. The retrosternal soft tissues are normal. The thoracic esophagus is unremarkable. The caliber of the thoracic aorta is normal and no very aortic hematoma is demonstrated. There is no lymphadenopathy.  There is a mildly displaced fracture of the right seventh rib and nondisplaced fractures of the right fifth and sixth ribs laterally. No left rib fracture is demonstrated. The observed portions of the clavicles are intact. The patient has undergone previous median sternotomy. The thoracic vertebral bodies are preserved in height.  CT ABDOMEN AND PELVIS FINDINGS  The liver, spleen, nondistended stomach, pancreas, adrenal glands, and kidneys exhibit no acute abnormalities. The gallbladder is surgically absent. The caliber of the abdominal aorta is normal. The small and large bowel exhibit no evidence of obstruction or ileus or mural hematoma. The uterus is surgically absent. The urinary bladder is unremarkable. There is no free intra-abdominal or pelvic fluid.  There is partial compression of the body of L3 which is stable. There is mild retropulsion of bone which is also stable. There is no spondylolisthesis. There is degenerative facet joint change at multiple levels. New the bony pelvis exhibits no acute fracture. The hip joint spaces are mildly narrowed but there is no acute fracture. There is no significant subcutaneous or deeper soft tissue hematoma.  IMPRESSION: 1. There are fractures of the right fifth through seventh ribs laterally. There is no pneumothorax or pleural effusion or pulmonary laceration. There is minimal patchy density in the posterior inferior aspect of the right lung which is nonspecific not visible on today's plain films. This will merit follow-up with noncontrast chest CT scan in 3-6 months. 2. There is no evidence of a  mediastinal hematoma nor other acute cardiopulmonary abnormality. The patient has undergone previous CABG. 3. There is no acute intra-abdominal/pelvic posttraumatic injury.   Electronically Signed   By: David  Martinique   On: 08/27/2014 21:02   Dg Ribs Unilateral W/chest Right  08/27/2014   CLINICAL DATA:  History of trauma from a fall complaining of pain in the anterior aspect of the right ribs.  EXAM: RIGHT RIBS AND  CHEST - 3+ VIEW  COMPARISON:  Chest x-ray 02/16/2014.  FINDINGS: Lung volumes are normal. No consolidative airspace disease. No pleural effusions. No pneumothorax. No pulmonary nodule or mass noted. Pulmonary vasculature and the cardiomediastinal silhouette are within normal limits. Atherosclerosis in the thoracic aorta. Status post median sternotomy for CABG.  Dedicated views of the right ribs demonstrate acute nondisplaced fractures of the lateral aspects of the sixth and seventh ribs.  IMPRESSION: 1. Acute nondisplaced fractures of the lateral aspect of the right sixth and seventh ribs. 2. No associated pneumothorax. 3. Atherosclerosis.   Electronically Signed   By: Vinnie Langton M.D.   On: 08/27/2014 19:18   Dg Wrist Complete Right  08/27/2014   CLINICAL DATA:  [place] walking to bathroom today, RIGHT wrist pain  EXAM: RIGHT WRIST - COMPLETE 3+ VIEW  COMPARISON:  None  FINDINGS: Diffuse osseous demineralization.  Joint spaces preserved.  Soft tissue swelling at wrist and distal forearm.  No definite acute fracture, dislocation, or bone destruction.  Scattered vascular calcifications.  IMPRESSION: Osseous demineralization.  No definite acute bony abnormalities.   Electronically Signed   By: Lavonia Dana M.D.   On: 08/27/2014 19:19   Ct Head Wo Contrast  08/27/2014   CLINICAL DATA:  Pain post trauma  EXAM: CT HEAD WITHOUT CONTRAST  CT CERVICAL SPINE WITHOUT CONTRAST  TECHNIQUE: Multidetector CT imaging of the head and cervical spine was performed following the standard protocol without  intravenous contrast. Multiplanar CT image reconstructions of the cervical spine were also generated.  COMPARISON:  August 14, 2011  FINDINGS: CT HEAD FINDINGS  There is mild diffuse atrophy. There is no appreciable mass, hemorrhage, extra-axial fluid collection, or midline shift. There is patchy small vessel disease in the centra semiovale bilaterally. There is an old infarct in the medial right occipital lobe, stable. There is evidence of prior infarct in the posterior inferior right cerebellum, stable. No new gray-white compartment lesions are appreciable. No acute infarct apparent. The bony calvarium appears intact. The mastoid air cells are clear.  CT CERVICAL SPINE FINDINGS  There is no fracture or spondylolisthesis. Prevertebral soft tissues and predental space regions are normal. There is moderate disc space narrowing at C7-T1. There is facet osteoarthritic change at several levels bilaterally. No disc extrusion or stenosis. There is some bony overgrowth at the level of the odontoid which is stable. There is calcification in both carotid arteries.  IMPRESSION: CT head: Atrophy with prior infarcts and small vessel disease. No intracranial mass, hemorrhage, or extra-axial fluid. No new gray-white compartment lesion.  CT cervical spine: Areas of osteoarthritic change. No fracture or spondylolisthesis. There is carotid artery calcification bilaterally.   Electronically Signed   By: Lowella Grip M.D.   On: 08/27/2014 20:53   Ct Chest Wo Contrast  08/27/2014   CLINICAL DATA:  Tripped and fell now with right rib pain and known right sixth and seventh rib fractures  EXAM: CT CHEST, ABDOMEN AND PELVIS WITHOUT CONTRAST  TECHNIQUE: Multidetector CT imaging of the chest, abdomen and pelvis was performed following the standard protocol without IV contrast.  COMPARISON:  Chest x-ray and right rib series of today's date.  FINDINGS: CT CHEST FINDINGS  The lungs are well-expanded. There is no pneumothorax or  pneumomediastinum. There is no pleural effusion. There is a patchy area of density in the posterior inferior aspect of the right upper lobe adjacent to the major fissure demonstrated on image 28.  The cardiac chambers are normal in size. The patient has  undergone previous CABG. There is mitral annular calcification. There is no pericardial effusion. The retrosternal soft tissues are normal. The thoracic esophagus is unremarkable. The caliber of the thoracic aorta is normal and no very aortic hematoma is demonstrated. There is no lymphadenopathy.  There is a mildly displaced fracture of the right seventh rib and nondisplaced fractures of the right fifth and sixth ribs laterally. No left rib fracture is demonstrated. The observed portions of the clavicles are intact. The patient has undergone previous median sternotomy. The thoracic vertebral bodies are preserved in height.  CT ABDOMEN AND PELVIS FINDINGS  The liver, spleen, nondistended stomach, pancreas, adrenal glands, and kidneys exhibit no acute abnormalities. The gallbladder is surgically absent. The caliber of the abdominal aorta is normal. The small and large bowel exhibit no evidence of obstruction or ileus or mural hematoma. The uterus is surgically absent. The urinary bladder is unremarkable. There is no free intra-abdominal or pelvic fluid.  There is partial compression of the body of L3 which is stable. There is mild retropulsion of bone which is also stable. There is no spondylolisthesis. There is degenerative facet joint change at multiple levels. New the bony pelvis exhibits no acute fracture. The hip joint spaces are mildly narrowed but there is no acute fracture. There is no significant subcutaneous or deeper soft tissue hematoma.  IMPRESSION: 1. There are fractures of the right fifth through seventh ribs laterally. There is no pneumothorax or pleural effusion or pulmonary laceration. There is minimal patchy density in the posterior inferior aspect  of the right lung which is nonspecific not visible on today's plain films. This will merit follow-up with noncontrast chest CT scan in 3-6 months. 2. There is no evidence of a mediastinal hematoma nor other acute cardiopulmonary abnormality. The patient has undergone previous CABG. 3. There is no acute intra-abdominal/pelvic posttraumatic injury.   Electronically Signed   By: David  Martinique   On: 08/27/2014 21:02   Ct Cervical Spine Wo Contrast  08/27/2014   CLINICAL DATA:  Pain post trauma  EXAM: CT HEAD WITHOUT CONTRAST  CT CERVICAL SPINE WITHOUT CONTRAST  TECHNIQUE: Multidetector CT imaging of the head and cervical spine was performed following the standard protocol without intravenous contrast. Multiplanar CT image reconstructions of the cervical spine were also generated.  COMPARISON:  August 14, 2011  FINDINGS: CT HEAD FINDINGS  There is mild diffuse atrophy. There is no appreciable mass, hemorrhage, extra-axial fluid collection, or midline shift. There is patchy small vessel disease in the centra semiovale bilaterally. There is an old infarct in the medial right occipital lobe, stable. There is evidence of prior infarct in the posterior inferior right cerebellum, stable. No new gray-white compartment lesions are appreciable. No acute infarct apparent. The bony calvarium appears intact. The mastoid air cells are clear.  CT CERVICAL SPINE FINDINGS  There is no fracture or spondylolisthesis. Prevertebral soft tissues and predental space regions are normal. There is moderate disc space narrowing at C7-T1. There is facet osteoarthritic change at several levels bilaterally. No disc extrusion or stenosis. There is some bony overgrowth at the level of the odontoid which is stable. There is calcification in both carotid arteries.  IMPRESSION: CT head: Atrophy with prior infarcts and small vessel disease. No intracranial mass, hemorrhage, or extra-axial fluid. No new gray-white compartment lesion.  CT cervical  spine: Areas of osteoarthritic change. No fracture or spondylolisthesis. There is carotid artery calcification bilaterally.   Electronically Signed   By: Lowella Grip M.D.   On:  08/27/2014 20:53     EKG Interpretation   Date/Time:  Monday August 27 2014 18:42:52 EDT Ventricular Rate:  64 PR Interval:  139 QRS Duration: 97 QT Interval:  458 QTC Calculation: 473 R Axis:   49 Text Interpretation:  Sinus rhythm Nonspecific repol abnormality, diffuse  leads Nonspecific ST abnormality similar  to 2009 Confirmed by Melena Hayes   MD, [place] [phone]) on 08/27/2014 7:39:03 PM      MDM   Final diagnoses:  Rib fractures, right, closed, initial encounter   Mechanical fall with right rib pain. No loss of consciousness.  X-ray shows right-sided rib fractures. No pneumothorax.\  CT results discussed with patient and daughter. She has 3 rib fractures on the right side. No pneumothorax or intra-abdominal injury.  Patient feels well and wishes to go home. She feels her pain is well-controlled. She is not hypoxic. She has no increased work of breathing. Her daughter states she will be able to watch her. Admission offered for pain control and observation which was declined.  Patient will be treated at home with pain medication, incentive spirometry, follow with PCP. Return to the ED with worsening pain, difficulty breathing or any other concerns  BP 144/60  Pulse 66  Temp(Src) 98.7 F (37.1 C)  Resp 18  Ht 5\' 6"  (1.676 m)  Wt 196 lb (88.905 kg)  BMI 31.65 kg/m2  SpO2 97%   EMERGENCY DEPARTMENT [place] FAST EXAM  INDICATIONS:Blunt trauma to the Thorax and Blunt injury of abdomen  PERFORMED BY: Myself  IMAGES ARCHIVED?: Yes  FINDINGS: All views negative  LIMITATIONS:  Body habitus and Emergent procedure  INTERPRETATION:  No abdominal free fluid and No pericardial effusion  COMMENT:  Unable to save images due to full storage.     Ezequiel Essex, MD 08/27/14 [id number]

## 2014-08-27 NOTE — ED Notes (Signed)
Pt care and follow up instructions discussed.pt and family verbalized understanding.

## 2014-08-27 NOTE — Discharge Instructions (Signed)
Rib Fracture Take the pain medication as prescribed, take deep breaths and use the spirometer as instructed. Followup with your doctor this week. Return to the ED develop worsening pain, shortness of breath or any other concerns A rib fracture is a break or crack in one of the bones of the ribs. The ribs are a group of long, curved bones that wrap around your chest and attach to your spine. They protect your lungs and other organs in the chest cavity. A broken or cracked rib is often painful, but most do not cause other problems. Most rib fractures heal on their own over time. However, rib fractures can be more serious if multiple ribs are broken or if broken ribs move out of place and push against other structures. CAUSES   A direct blow to the chest. For example, this could happen during contact sports, a car accident, or a fall against a hard object.  Repetitive movements with high force, such as pitching a baseball or having severe coughing spells. SYMPTOMS   Pain when you breathe in or cough.  Pain when someone presses on the injured area. DIAGNOSIS  Your caregiver will perform a physical exam. Various imaging tests may be ordered to confirm the diagnosis and to look for related injuries. These tests may include a chest X-ray, computed tomography (CT), magnetic resonance imaging (MRI), or a bone scan. TREATMENT  Rib fractures usually heal on their own in 1-3 months. The longer healing period is often associated with a continued cough or other aggravating activities. During the healing period, pain control is very important. Medication is usually given to control pain. Hospitalization or surgery may be needed for more severe injuries, such as those in which multiple ribs are broken or the ribs have moved out of place.  HOME CARE INSTRUCTIONS   Avoid strenuous activity and any activities or movements that cause pain. Be careful during activities and avoid bumping the injured rib.  Gradually  increase activity as directed by your caregiver.  Only take over-the-counter or prescription medications as directed by your caregiver. Do not take other medications without asking your caregiver first.  Apply ice to the injured area for the first 1-2 days after you have been treated or as directed by your caregiver. Applying ice helps to reduce inflammation and pain.  Put ice in a plastic bag.  Place a towel between your skin and the bag.   Leave the ice on for 15-20 minutes at a time, every 2 hours while you are awake.  Perform deep breathing as directed by your caregiver. This will help prevent pneumonia, which is a common complication of a broken rib. Your caregiver may instruct you to:  Take deep breaths several times a day.  Try to cough several times a day, holding a pillow against the injured area.  Use a device called an incentive spirometer to practice deep breathing several times a day.  Drink enough fluids to keep your urine clear or pale yellow. This will help you avoid constipation.   Do not wear a rib belt or binder. These restrict breathing, which can lead to pneumonia.  SEEK IMMEDIATE MEDICAL CARE IF:   You have a fever.   You have difficulty breathing or shortness of breath.   You develop a continual cough, or you cough up thick or bloody sputum.  You feel sick to your stomach (nausea), throw up (vomit), or have abdominal pain.   You have worsening pain not controlled with medications.  MAKE SURE YOU:  Understand these instructions.  Will watch your condition.  Will get help right away if you are not doing well or get worse. Document Released: 11/23/2005 Document Revised: 07/26/2013 Document Reviewed: 01/25/2013 [facility] Patient Information 2015 [place], [place]. This information is not intended to replace advice given to you by your health care provider. Make sure you discuss any questions you have with your health care provider.

## 2014-08-31 ENCOUNTER — Encounter: Payer: Self-pay | Admitting: Family Medicine

## 2014-08-31 ENCOUNTER — Ambulatory Visit (INDEPENDENT_AMBULATORY_CARE_PROVIDER_SITE_OTHER): Payer: Medicare Other | Admitting: Family Medicine

## 2014-08-31 VITALS — BP 195/84 | HR 76 | Temp 97.4°F | Ht 66.0 in | Wt 198.0 lb

## 2014-08-31 DIAGNOSIS — I2510 Atherosclerotic heart disease of native coronary artery without angina pectoris: Secondary | ICD-10-CM

## 2014-08-31 DIAGNOSIS — IMO0001 Reserved for inherently not codable concepts without codable children: Secondary | ICD-10-CM

## 2014-08-31 DIAGNOSIS — S2231XD Fracture of one rib, right side, subsequent encounter for fracture with routine healing: Secondary | ICD-10-CM

## 2014-08-31 MED ORDER — KETOROLAC TROMETHAMINE 30 MG/ML IJ SOLN
30.0000 mg | Freq: Once | INTRAMUSCULAR | Status: AC
  Administered 2014-08-31: 30 mg via INTRAVENOUS

## 2014-08-31 MED ORDER — HYDROCODONE-ACETAMINOPHEN 5-325 MG PO TABS: 1.0000 | ORAL_TABLET | ORAL | Status: DC | PRN

## 2014-08-31 NOTE — Progress Notes (Signed)
   Subjective:    [name], female    DOB: Jul 19, 1931, 80 y.o.   MRN: [id number]  HPI C/o fall and fx 3 ribs on the right side.  She fell 4 days ago and was seen in [facility] ED.   Review of Systems C/o right rib pain. No chest pain, SOB, HA, dizziness, vision change, N/V, diarrhea, constipation, dysuria, urinary urgency or frequency or rash.     Objective:   Physical Exam Vital signs noted  Well developed well nourished female.  HEENT - Head atraumatic Normocephalic Respiratory - Lungs CTA bilateral Cardiac - RRR S1 and S2 without murmur GI - Abdomen soft Nontender and bowel sounds active x 4 Neuro - Grossly intact. MS - TTP right chest wall.      Assessment & Plan:  Rib fracture, right, with routine healing, subsequent encounter - Plan: ketorolac (TORADOL) 30 MG/ML injection 30 mg norco 5mg  one po qid prn pain #45 Incentive spirometer  Lysbeth Penner FNP

## 2014-09-04 ENCOUNTER — Other Ambulatory Visit: Payer: Self-pay | Admitting: *Deleted

## 2014-09-04 NOTE — Telephone Encounter (Signed)
Last filled 08/06/14, last seen 08/31/14. Route to pool A so nurse can call into [facility] [phone]

## 2014-09-06 MED ORDER — LORAZEPAM 0.5 MG PO TABS: 0.5000 mg | ORAL_TABLET | Freq: Two times a day (BID) | ORAL | Status: DC

## 2014-09-20 ENCOUNTER — Encounter: Payer: Self-pay | Admitting: Family Medicine

## 2014-09-20 ENCOUNTER — Ambulatory Visit (INDEPENDENT_AMBULATORY_CARE_PROVIDER_SITE_OTHER): Payer: Medicare Other | Admitting: Family Medicine

## 2014-09-20 VITALS — BP 181/78 | HR 62 | Temp 97.4°F | Ht 66.0 in | Wt 195.6 lb

## 2014-09-20 DIAGNOSIS — Z794 Long term (current) use of insulin: Secondary | ICD-10-CM

## 2014-09-20 DIAGNOSIS — F32A Depression, unspecified: Secondary | ICD-10-CM

## 2014-09-20 DIAGNOSIS — R5383 Other fatigue: Secondary | ICD-10-CM

## 2014-09-20 DIAGNOSIS — IMO0002 Reserved for concepts with insufficient information to code with codable children: Secondary | ICD-10-CM

## 2014-09-20 DIAGNOSIS — I10 Essential (primary) hypertension: Secondary | ICD-10-CM

## 2014-09-20 DIAGNOSIS — E1165 Type 2 diabetes mellitus with hyperglycemia: Secondary | ICD-10-CM

## 2014-09-20 DIAGNOSIS — E119 Type 2 diabetes mellitus without complications: Secondary | ICD-10-CM

## 2014-09-20 DIAGNOSIS — K2100 Gastro-esophageal reflux disease with esophagitis, without bleeding: Secondary | ICD-10-CM

## 2014-09-20 DIAGNOSIS — F329 Major depressive disorder, single episode, unspecified: Secondary | ICD-10-CM

## 2014-09-20 DIAGNOSIS — I2584 Coronary atherosclerosis due to calcified coronary lesion: Secondary | ICD-10-CM

## 2014-09-20 DIAGNOSIS — I2510 Atherosclerotic heart disease of native coronary artery without angina pectoris: Secondary | ICD-10-CM

## 2014-09-20 LAB — POCT CBC
Granulocyte percent: 76.2 %G (ref 37–80)
HCT, POC: 36.1 % — AB (ref 37.7–47.9)
Hemoglobin: 11.7 g/dL — AB (ref 12.2–16.2)
Lymph, poc: 1.3 (ref 0.6–3.4)
MCH, POC: 28.5 pg (ref 27–31.2)
MCHC: 32.4 g/dL (ref 31.8–35.4)
MCV: 87.9 fL (ref 80–97)
MPV: 8.8 fL (ref 0–99.8)
POC Granulocyte: 4.8 (ref 2–6.9)
POC LYMPH PERCENT: 20.4 %L (ref 10–50)
Platelet Count, POC: 191 10*3/uL (ref 142–424)
RBC: 4.1 M/uL (ref 4.04–5.48)
RDW, POC: 13.7 %
WBC: 6.3 10*3/uL (ref 4.6–10.2)

## 2014-09-20 LAB — POCT GLYCOSYLATED HEMOGLOBIN (HGB A1C): Hemoglobin A1C: 7.9

## 2014-09-20 MED ORDER — ATORVASTATIN CALCIUM 20 MG PO TABS: 20.0000 mg | ORAL_TABLET | Freq: Every day | ORAL | Status: DC

## 2014-09-20 MED ORDER — LORAZEPAM 0.5 MG PO TABS: 0.5000 mg | ORAL_TABLET | Freq: Two times a day (BID) | ORAL | Status: DC

## 2014-09-20 MED ORDER — FUROSEMIDE 20 MG PO TABS: 20.0000 mg | ORAL_TABLET | Freq: Every day | ORAL | Status: DC | PRN

## 2014-09-20 MED ORDER — METOPROLOL TARTRATE 50 MG PO TABS: 50.0000 mg | ORAL_TABLET | Freq: Two times a day (BID) | ORAL | Status: DC

## 2014-09-20 MED ORDER — PAROXETINE HCL 20 MG PO TABS: 20.0000 mg | ORAL_TABLET | Freq: Every day | ORAL | Status: DC

## 2014-09-20 MED ORDER — INSULIN NPH (HUMAN) (ISOPHANE) 100 UNIT/ML ~~LOC~~ SUSP: SUBCUTANEOUS | Status: DC

## 2014-09-20 MED ORDER — NITROGLYCERIN 0.4 MG SL SUBL: 0.4000 mg | SUBLINGUAL_TABLET | SUBLINGUAL | Status: DC | PRN

## 2014-09-20 MED ORDER — CLOPIDOGREL BISULFATE 75 MG PO TABS: 75.0000 mg | ORAL_TABLET | Freq: Every day | ORAL | Status: DC

## 2014-09-20 MED ORDER — LISINOPRIL 5 MG PO TABS: 5.0000 mg | ORAL_TABLET | Freq: Every day | ORAL | Status: DC

## 2014-09-20 MED ORDER — ESOMEPRAZOLE MAGNESIUM 40 MG PO CPDR: 40.0000 mg | DELAYED_RELEASE_CAPSULE | Freq: Every day | ORAL | Status: DC | PRN

## 2014-09-20 MED ORDER — AMLODIPINE BESYLATE 10 MG PO TABS: 10.0000 mg | ORAL_TABLET | Freq: Every day | ORAL | Status: DC

## 2014-09-20 MED ORDER — METFORMIN HCL ER 500 MG PO TB24: ORAL_TABLET | ORAL | Status: DC

## 2014-09-20 NOTE — Progress Notes (Signed)
   Subjective:    [name], female    DOB: 1931-10-15, 80 y.o.   MRN: [id number]  HPI  Patient is here for follow up.  She has hx of hyperlipidemia, hypertension, diabetes, GERD, and CAD. She has hx of CVA and she has some mild dysarthria.  She fx some ribs a few months ago and had left rib pain which has resolved.  She is accompanied by her daughter. She needs labs and refills.  Review of Systems    No chest pain, SOB, HA, dizziness, vision change, N/V, diarrhea, constipation, dysuria, urinary urgency or frequency, myalgias, arthralgias or rash.  Objective:   Physical Exam Vital signs noted  Well developed well nourished female.  HEENT - Head atraumatic Normocephalic                Eyes - PERRLA, Conjuctiva - clear Sclera- Clear EOMI                Ears - EAC's Wnl TM's Wnl Gross Hearing WNL                Nose - Nares patent                 Throat - oropharanx wnl Respiratory - Lungs CTA bilateral Cardiac - RRR S1 and S2 without murmur GI - Abdomen soft Nontender and bowel sounds active x 4 Extremities - No edema. Neuro - Grossly intact.       Assessment & Plan:  Diabetes mellitus type 2, uncontrolled - Plan: POCT glycosylated hemoglobin (Hb A1C), Lipid panel, CMP14+EGFR, metoprolol (LOPRESSOR) 50 MG tablet  Other fatigue - Plan: POCT CBC, Thyroid Panel With TSH  Type 2 diabetes mellitus with insulin therapy - Plan: insulin NPH Human (HUMULIN N) 100 UNIT/ML injection, metFORMIN (GLUCOPHAGE XR) 500 MG 24 hr tablet  Essential hypertension, benign - Plan: metoprolol (LOPRESSOR) 50 MG tablet, lisinopril (PRINIVIL,ZESTRIL) 5 MG tablet, furosemide (LASIX) 20 MG tablet, amLODipine (NORVASC) 10 MG tablet  Coronary artery disease due to calcified coronary lesion - Plan: atorvastatin (LIPITOR) 20 MG tablet, nitroGLYCERIN (NITROSTAT) 0.4 MG SL tablet, clopidogrel (PLAVIX) 75 MG tablet  Depression - Plan: LORazepam (ATIVAN) 0.5 MG tablet, PARoxetine (PAXIL) 20 MG  tablet  Depression (emotion)  Gastroesophageal reflux disease with esophagitis - Plan: esomeprazole (NEXIUM) 40 MG capsule  Follow up in 3 months  Lysbeth Penner FNP

## 2014-09-21 LAB — CMP14+EGFR
ALT: 12 IU/L (ref 0–32)
AST: 16 IU/L (ref 0–40)
Albumin/Globulin Ratio: 1.6 (ref 1.1–2.5)
Albumin: 3.9 g/dL (ref 3.5–4.7)
Alkaline Phosphatase: 70 IU/L (ref 39–117)
BUN/Creatinine Ratio: 16 (ref 11–26)
BUN: 18 mg/dL (ref 8–27)
CO2: 26 mmol/L (ref 18–29)
Calcium: 9.6 mg/dL (ref 8.7–10.3)
Chloride: 99 mmol/L (ref 97–108)
Creatinine, Ser: 1.12 mg/dL — ABNORMAL HIGH (ref 0.57–1.00)
GFR calc Af Amer: 54 mL/min/{1.73_m2} — ABNORMAL LOW (ref >59)
GFR calc non Af Amer: 47 mL/min/{1.73_m2} — ABNORMAL LOW (ref >59)
Globulin, Total: 2.5 g/dL (ref 1.5–4.5)
Glucose: 215 mg/dL — ABNORMAL HIGH (ref 65–99)
Potassium: 4.9 mmol/L (ref 3.5–5.2)
Sodium: 139 mmol/L (ref 134–144)
Total Bilirubin: 0.2 mg/dL (ref 0.0–1.2)
Total Protein: 6.4 g/dL (ref 6.0–8.5)

## 2014-09-21 LAB — LIPID PANEL
Chol/HDL Ratio: 2 ratio units (ref 0.0–4.4)
Cholesterol, Total: 119 mg/dL (ref 100–199)
HDL: 60 mg/dL (ref >39)
LDL Calculated: 36 mg/dL (ref 0–99)
Triglycerides: 116 mg/dL (ref 0–149)
VLDL Cholesterol Cal: 23 mg/dL (ref 5–40)

## 2014-09-21 LAB — THYROID PANEL WITH TSH
Free Thyroxine Index: 1.9 (ref 1.2–4.9)
T3 Uptake Ratio: 35 % (ref 24–39)
T4, Total: 5.3 ug/dL (ref 4.5–12.0)
TSH: 1.5 u[IU]/mL (ref 0.450–4.500)

## 2014-09-24 ENCOUNTER — Other Ambulatory Visit: Payer: Self-pay | Admitting: Family Medicine

## 2014-10-08 ENCOUNTER — Other Ambulatory Visit: Payer: Self-pay | Admitting: General Practice

## 2014-10-08 ENCOUNTER — Other Ambulatory Visit: Payer: Self-pay | Admitting: Family Medicine

## 2014-10-10 NOTE — Telephone Encounter (Signed)
She was given 25 on 09/20/14

## 2014-10-29 ENCOUNTER — Encounter: Payer: Self-pay | Admitting: *Deleted

## 2014-10-29 ENCOUNTER — Ambulatory Visit: Payer: Self-pay

## 2014-11-08 ENCOUNTER — Other Ambulatory Visit: Payer: Self-pay | Admitting: Family Medicine

## 2014-11-09 NOTE — Telephone Encounter (Signed)
Patient last seen in office on 09-20-14. Rx last filled on 10-08-14 for #60. Please advise. If approved please route to Pool A so nurse can call in to pharmacy

## 2014-12-03 ENCOUNTER — Other Ambulatory Visit: Payer: Self-pay | Admitting: Family Medicine

## 2014-12-03 NOTE — Telephone Encounter (Signed)
Please advise on refill, last seen 09/20/14.  Has follow up with Evelina Dun 01/11/15.

## 2014-12-04 ENCOUNTER — Telehealth: Payer: Self-pay | Admitting: *Deleted

## 2014-12-04 ENCOUNTER — Other Ambulatory Visit: Payer: Self-pay | Admitting: General Practice

## 2014-12-04 NOTE — Telephone Encounter (Signed)
Left message for pt that RX for Lorazepam is ready for pick up RX to front

## 2014-12-14 ENCOUNTER — Telehealth: Payer: Self-pay | Admitting: Cardiology

## 2014-12-19 ENCOUNTER — Encounter: Payer: Self-pay | Admitting: Cardiology

## 2014-12-19 ENCOUNTER — Ambulatory Visit (INDEPENDENT_AMBULATORY_CARE_PROVIDER_SITE_OTHER): Payer: Medicare Other | Admitting: Cardiology

## 2014-12-19 VITALS — BP 142/70 | HR 72 | Ht 66.0 in | Wt 196.0 lb

## 2014-12-19 DIAGNOSIS — I6523 Occlusion and stenosis of bilateral carotid arteries: Secondary | ICD-10-CM

## 2014-12-19 DIAGNOSIS — I10 Essential (primary) hypertension: Secondary | ICD-10-CM

## 2014-12-19 NOTE — Patient Instructions (Addendum)
The current medical regimen is effective;  continue present plan and medications.  Your physician has requested that you have a carotid duplex. This test is an ultrasound of the carotid arteries in your neck. It looks at blood flow through these arteries that supply the brain with blood. Allow one hour for this exam. There are no restrictions or special instructions.  Follow up in 1 year with Dr. Percival Spanish in [place].  You will receive a letter in the mail 2 months before you are due.  Please call us when you receive this letter to schedule your follow up appointment.

## 2014-12-19 NOTE — Progress Notes (Signed)
HPI The patient presents for one-year followup. Since I last saw her she has had no new cardiovascular complaints.  She has lots of abdominal and back pain. She is being considered for colonoscopy and I was asked about stopping her Plavix temporarily. The patient doesn't have any new significant cardiovascular complaints since her stress test in 2014. Her daughter said she's not particularly active though she can walk 30 yards to the mailbox. She does some light housework. With this she does not get a cardiovascular symptoms. The patient denies any new symptoms such as chest discomfort, neck or arm discomfort. There has been no new shortness of breath, PND or orthopnea. There have been no reported palpitations, presyncope or syncope.    Allergies  Allergen Reactions  . Propoxyphene N-Acetaminophen Other (See Comments)    Numbness all over. "floating" sensation.    Current Outpatient Prescriptions  Medication Sig Dispense Refill  . amLODipine (NORVASC) 10 MG tablet Take 1 tablet (10 mg total) by mouth daily. 30 tablet 11  . Artificial Tear Ointment (DRY EYES OP) Apply 1 drop to eye daily as needed. For dry eye/ red eye     . atorvastatin (LIPITOR) 20 MG tablet Take 1 tablet (20 mg total) by mouth at bedtime. 30 tablet 11  . clopidogrel (PLAVIX) 75 MG tablet Take 1 tablet (75 mg total) by mouth daily. 30 tablet 11  . esomeprazole (NEXIUM) 40 MG capsule Take 1 capsule (40 mg total) by mouth daily as needed (FO ACID REFLUX/GERD). 30 capsule 11  . furosemide (LASIX) 20 MG tablet Take 1 tablet (20 mg total) by mouth daily as needed for fluid or edema. 30 tablet 5  . insulin NPH Human (HUMULIN N) 100 UNIT/ML injection Inject 65 units each morning with breakfast and 28 units each evening with supper. 40 mL 11  . lisinopril (PRINIVIL,ZESTRIL) 5 MG tablet Take 1 tablet (5 mg total) by mouth daily. 30 tablet 11  . LORazepam (ATIVAN) 0.5 MG tablet TAKE (1) TABLET TWICE DAILY. 60 tablet 0  . meclizine  (ANTIVERT) 25 MG tablet TAKE 1 TABLET 2 TIMES A DAY AS NEEDED FOR DIZZINESS. 60 tablet 3  . metFORMIN (GLUCOPHAGE XR) 500 MG 24 hr tablet Take 1 tablet with breakfast and 1 tablet with supper 60 tablet 11  . metoprolol (LOPRESSOR) 50 MG tablet Take 1 tablet (50 mg total) by mouth 2 (two) times daily. 60 tablet 11  . Multiple Vitamins-Iron (DAILY VITAMINS/IRON/BETA CAROT PO) Take 1 tablet by mouth at bedtime.    Marland Kitchen NITROSTAT 0.4 MG SL tablet PLACE ONE (1) TABLET UNDER TONGUE EVERY 5 MINUTES UP TO (3) DOSES AS NEEDED FOR CHEST PAIN. 25 tablet 2  . PARoxetine (PAXIL) 20 MG tablet Take 1 tablet (20 mg total) by mouth daily. 30 tablet 11  . aspirin 81 MG EC tablet Take 81 mg by mouth at bedtime.     Marland Kitchen EASY TOUCH INSULIN SYRINGE 31G X 5/16" 1 ML MISC USE AS DIRECTED. 80 each 1   No current facility-administered medications for this visit.    Past Medical History  Diagnosis Date  . Anemia   . Diabetes mellitus   . Hypertension   . Iron deficiency anemia 05/27/2011  . CHF (congestive heart failure)     EF preserved Echo 2012  . CVA (cerebral infarction)   . Carotid stenosis   . Renal insufficiency   . Furuncle of back, except buttock   . Vertigo   . CAD (coronary artery disease)  Past Surgical History  Procedure Laterality Date  . Cholecystectomy    . I & d of furuncle  April 2013  . Total abdominal hysterectomy      complete  . Coronary artery bypass graft  2006    ROS:  Back pain, balance difficulties. Otherwise as stated in the HPI and negative for all other systems.  PHYSICAL EXAM BP 142/70 mmHg  Pulse 72  Ht 5\' 6"  (1.676 m)  Wt 196 lb (88.905 kg)  BMI 31.65 kg/m2 GENERAL:  Well appearing HEENT:  Pupils equal round and reactive, fundi not visualized, oral mucosa unremarkable, dentures NECK:  No jugular venous distention, waveform within normal limits, carotid upstroke brisk and symmetric, left bruits, no thyromegaly LYMPHATICS:  No cervical, inguinal adenopathy LUNGS:   Clear to auscultation bilaterally BACK:  No CVA tenderness CHEST:  Well healed sternotomy scar. HEART:  PMI not displaced or sustained,S1 and S2 within normal limits, no S3, no S4, no clicks, no rubs, no murmurs ABD:  Flat, positive bowel sounds normal in frequency in pitch, no bruits, no rebound, no guarding, no midline pulsatile mass, no hepatomegaly, no splenomegaly EXT:  2 plus pulses upper and diminished dorsalis pedis and posterior tibialis bilateral lower extremities, mild bilateral lower extremity ankle edema, no cyanosis no clubbing NEURO:  Nonfocal  EKG:  Sinus rhythm, rate 72, axis within normal limits, intervals within normal limits, no acute ST-T wave changes, inferolateral ST depressions unchanged from previous 12/19/2014  ASSESSMENT AND PLAN  CAD:  The patient has had no new symptoms since her stress test in 2014. No further cardiovascular testing is suggested. She should have continued risk reduction. She would be a come off the Plavixfor colonoscopy as needed.  I want her to exercise more and so I will refer her   CAROTID STENOSIS:  She has 40-59% bilateral stenosis and she is overdue for followup. I will arrange carotid Dopplers.  HTN:  The blood pressure is at target. No change in medications is indicated. We will continue with therapeutic lifestyle changes (TLC).   DYSLIPIDEMIA:  Her LDL was 36.  She will continue the meds as listed.

## 2014-12-24 ENCOUNTER — Ambulatory Visit (HOSPITAL_COMMUNITY): Payer: Medicare Other | Attending: Internal Medicine | Admitting: *Deleted

## 2014-12-24 DIAGNOSIS — I6523 Occlusion and stenosis of bilateral carotid arteries: Secondary | ICD-10-CM | POA: Diagnosis present

## 2014-12-24 NOTE — Progress Notes (Signed)
Carotid Duplex Performed 

## 2015-01-07 ENCOUNTER — Other Ambulatory Visit: Payer: Self-pay | Admitting: Family Medicine

## 2015-01-07 ENCOUNTER — Other Ambulatory Visit: Payer: Self-pay | Admitting: General Practice

## 2015-01-08 NOTE — Telephone Encounter (Signed)
Last seen 09/20/14 [facility] [place]  IF approved route to nurse to call into [facility]  [phone]

## 2015-01-08 NOTE — Telephone Encounter (Signed)
Called to [facility]. 

## 2015-01-10 ENCOUNTER — Ambulatory Visit: Payer: Medicare Other | Admitting: Family Medicine

## 2015-01-11 ENCOUNTER — Ambulatory Visit (INDEPENDENT_AMBULATORY_CARE_PROVIDER_SITE_OTHER): Payer: Medicare Other | Admitting: Family

## 2015-01-11 ENCOUNTER — Encounter: Payer: Self-pay | Admitting: Family

## 2015-01-11 VITALS — BP 162/74 | HR 62 | Temp 97.9°F | Ht 66.0 in | Wt 197.7 lb

## 2015-01-11 DIAGNOSIS — Z794 Long term (current) use of insulin: Secondary | ICD-10-CM

## 2015-01-11 DIAGNOSIS — E785 Hyperlipidemia, unspecified: Secondary | ICD-10-CM

## 2015-01-11 DIAGNOSIS — E119 Type 2 diabetes mellitus without complications: Secondary | ICD-10-CM

## 2015-01-11 DIAGNOSIS — I6523 Occlusion and stenosis of bilateral carotid arteries: Secondary | ICD-10-CM

## 2015-01-11 DIAGNOSIS — E782 Mixed hyperlipidemia: Secondary | ICD-10-CM | POA: Insufficient documentation

## 2015-01-11 DIAGNOSIS — I25709 Atherosclerosis of coronary artery bypass graft(s), unspecified, with unspecified angina pectoris: Secondary | ICD-10-CM

## 2015-01-11 DIAGNOSIS — F32A Depression, unspecified: Secondary | ICD-10-CM

## 2015-01-11 DIAGNOSIS — F329 Major depressive disorder, single episode, unspecified: Secondary | ICD-10-CM

## 2015-01-11 DIAGNOSIS — I10 Essential (primary) hypertension: Secondary | ICD-10-CM

## 2015-01-11 DIAGNOSIS — F411 Generalized anxiety disorder: Secondary | ICD-10-CM | POA: Insufficient documentation

## 2015-01-11 DIAGNOSIS — Z1321 Encounter for screening for nutritional disorder: Secondary | ICD-10-CM

## 2015-01-11 DIAGNOSIS — K219 Gastro-esophageal reflux disease without esophagitis: Secondary | ICD-10-CM | POA: Insufficient documentation

## 2015-01-11 LAB — POCT GLYCOSYLATED HEMOGLOBIN (HGB A1C): Hemoglobin A1C: 8.6

## 2015-01-11 MED ORDER — LISINOPRIL 10 MG PO TABS: 10.0000 mg | ORAL_TABLET | Freq: Every day | ORAL | Status: DC

## 2015-01-11 NOTE — Patient Instructions (Signed)

## 2015-01-11 NOTE — Progress Notes (Signed)
Subjective:    [name], female    DOB: July 20, 1931, 81 y.o.   MRN: [id number]  Diabetes She presents for her follow-up diabetic visit. She has type 2 diabetes mellitus. Her disease course has been stable. Hypoglycemia symptoms include nervousness/anxiousness. Pertinent negatives for hypoglycemia include no confusion, dizziness, headaches, mood changes or sleepiness. Associated symptoms include blurred vision. Pertinent negatives for diabetes include no foot paresthesias, no foot ulcerations and no visual change. Pertinent negatives for hypoglycemia complications include no blackouts and no hospitalization. Symptoms are stable. Diabetic complications include a CVA, heart disease and peripheral neuropathy. Pertinent negatives for diabetic complications include no nephropathy. Risk factors for coronary artery disease include dyslipidemia, diabetes mellitus, hypertension, obesity, sedentary lifestyle and post-menopausal. Current diabetic treatment includes insulin injections, diet and oral agent (monotherapy). She is compliant with treatment all of the time. She is following a generally unhealthy diet. (Pt does not take blood sugaras ) An ACE inhibitor/angiotensin II receptor blocker is being taken. Eye exam is current.  Hypertension This is a chronic problem. The current episode started more than 1 year ago. The problem is uncontrolled. Associated symptoms include anxiety, blurred vision, peripheral edema (At times) and shortness of breath. Pertinent negatives include no headaches or palpitations. Risk factors for coronary artery disease include diabetes mellitus, dyslipidemia, obesity, post-menopausal state and family history. Past treatments include ACE inhibitors and beta blockers. 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 There is no history of kidney disease, heart failure or a thyroid problem. There is no history of sleep apnea.  Hyperlipidemia This is a chronic  problem. The current episode started more than 1 year ago. The problem is uncontrolled. Recent lipid tests were reviewed and are high. Exacerbating diseases include diabetes and obesity. She has no history of hypothyroidism. Associated symptoms include leg pain and shortness of breath. Pertinent negatives include no myalgias. Current antihyperlipidemic treatment includes statins. The current treatment provides significant improvement of lipids. Risk factors for coronary artery disease include diabetes mellitus, dyslipidemia, family history, hypertension, obesity and post-menopausal.  Gastrophageal Reflux She reports no belching, no coughing, no heartburn, no hoarse voice or no sore throat. This is a chronic problem. The current episode started more than 1 year ago. The problem occurs rarely. The problem has been resolved. The symptoms are aggravated by certain foods. She has tried a PPI for the symptoms. The treatment provided significant relief.  Anxiety Presents for follow-up visit. Symptoms include depressed mood, nervous/anxious behavior and shortness of breath. Patient reports no confusion, dizziness or palpitations. Symptoms occur occasionally.   Past treatments include SSRIs and benzodiazephines.      Review of Systems  Constitutional: Negative.   HENT: Negative.  Negative for hoarse voice and sore throat.   Eyes: Positive for blurred vision.  Respiratory: Positive for shortness of breath. Negative for cough.   Cardiovascular: Negative.  Negative for palpitations.  Gastrointestinal: Negative.  Negative for heartburn.  Endocrine: Negative.   Genitourinary: Negative.   Musculoskeletal: Negative.  Negative for myalgias.  Neurological: Negative.  Negative for dizziness and headaches.  Hematological: Negative.   Psychiatric/Behavioral: Negative for confusion. The patient is nervous/anxious.   All other systems reviewed and are negative.      Objective:   Physical Exam  Constitutional:  She is oriented to person, place, and time. She appears well-developed and well-nourished. No distress.  HENT:  Head: Normocephalic and atraumatic.  Right Ear: External ear normal.  Left Ear: External ear normal.  Nose: Nose normal.  Mouth/Throat:  Oropharynx is clear and moist.  Eyes: Pupils are equal, round, and reactive to light.  Neck: Normal range of motion. Neck supple. No thyromegaly present.  Cardiovascular: Normal rate, regular rhythm, normal heart sounds and intact distal pulses.   No murmur heard. Pulmonary/Chest: Effort normal and breath sounds normal. No respiratory distress. She has no wheezes.  Abdominal: Soft. Bowel sounds are normal. She exhibits no distension. There is no tenderness.  Musculoskeletal: Normal range of motion. She exhibits edema (1+ edema in BLE). She exhibits no tenderness.  Neurological: She is alert and oriented to person, place, and time. She has normal reflexes. No cranial nerve deficit.  Skin: Skin is warm and dry.  Psychiatric: She has a normal mood and affect. Her behavior is normal. Judgment and thought content normal.  Vitals reviewed.     BP 196/78 mmHg  Pulse 69  Temp(Src) 97.9 F (36.6 C) (Oral)  Ht 5' 6"  (1.676 m)  Wt 197 lb 11.2 oz (89.676 kg)  BMI 31.92 kg/m2     Assessment & Plan:  1. Essential hypertension -Dash diet information given lisinopril increased to 10 mg from 46m on today's visit -Exercise encouraged - Stress Management  -Continue current meds -RTO in 2 weeks  CMP14+EGFR  2. 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 Type 2 diabetes mellitus with insulin therapy - POCT glycosylated hemoglobin (Hb A1C) - CMP14+EGFR  4. Hyperlipidemia - CMP14+EGFR - Lipid panel  5. GAD (generalized anxiety disorder) - CMP14+EGFR  6. Depression - CMP14+EGFR  7. Gastroesophageal reflux disease, esophagitis presence not  specified - CMP14+EGFR  8. Encounter for vitamin deficiency screening - Vit D  25 hydroxy (rtn osteoporosis monitoring)   Continue all meds Labs pending Health Maintenance reviewed Diet and exercise encouraged RTO 2 weeks for blood pressure  CEvelina Dun FNP

## 2015-01-12 LAB — CMP14+EGFR
ALK PHOS: 49 IU/L (ref 39–117)
ALT: 8 IU/L (ref 0–32)
AST: 14 IU/L (ref 0–40)
Albumin/Globulin Ratio: 1.5 (ref 1.1–2.5)
Albumin: 3.8 g/dL (ref 3.5–4.7)
BUN/Creatinine Ratio: 20 (ref 11–26)
BUN: 18 mg/dL (ref 8–27)
CALCIUM: 9.5 mg/dL (ref 8.7–10.3)
CO2: 26 mmol/L (ref 18–29)
CREATININE: 0.91 mg/dL (ref 0.57–1.00)
Chloride: 103 mmol/L (ref 97–108)
GFR calc non Af Amer: 59 mL/min/{1.73_m2} — ABNORMAL LOW (ref >59)
GFR, EST AFRICAN AMERICAN: 68 mL/min/{1.73_m2} (ref >59)
GLOBULIN, TOTAL: 2.6 g/dL (ref 1.5–4.5)
Glucose: 48 mg/dL — ABNORMAL LOW (ref 65–99)
Potassium: 4.4 mmol/L (ref 3.5–5.2)
Sodium: 142 mmol/L (ref 134–144)
Total Bilirubin: 0.2 mg/dL (ref 0.0–1.2)
Total Protein: 6.4 g/dL (ref 6.0–8.5)

## 2015-01-12 LAB — LIPID PANEL
CHOLESTEROL TOTAL: 142 mg/dL (ref 100–199)
Chol/HDL Ratio: 2.2 ratio units (ref 0.0–4.4)
HDL: 64 mg/dL (ref >39)
LDL Calculated: 57 mg/dL (ref 0–99)
Triglycerides: 107 mg/dL (ref 0–149)
VLDL Cholesterol Cal: 21 mg/dL (ref 5–40)

## 2015-01-12 LAB — VITAMIN D 25 HYDROXY (VIT D DEFICIENCY, FRACTURES): Vit D, 25-Hydroxy: 39.9 ng/mL (ref 30.0–100.0)

## 2015-01-14 ENCOUNTER — Other Ambulatory Visit: Payer: Self-pay | Admitting: Family

## 2015-01-14 ENCOUNTER — Telehealth: Payer: Self-pay | Admitting: Family

## 2015-01-14 MED ORDER — CANAGLIFLOZIN 100 MG PO TABS: 100.0000 mg | ORAL_TABLET | Freq: Every day | ORAL | Status: DC

## 2015-01-16 NOTE — Telephone Encounter (Signed)
Pt needs to take both medications!! Pt's blood glucose have been very high! She also needs to be on low carb diet.

## 2015-01-16 NOTE — Telephone Encounter (Signed)
Spoke to [employer] advised [name] needs to take both invokana and metformin.

## 2015-01-28 ENCOUNTER — Encounter: Payer: Self-pay | Admitting: Family

## 2015-01-28 ENCOUNTER — Ambulatory Visit (INDEPENDENT_AMBULATORY_CARE_PROVIDER_SITE_OTHER): Payer: Medicare Other | Admitting: Family

## 2015-01-28 VITALS — BP 140/69 | HR 63 | Temp 96.7°F | Ht 66.0 in | Wt 196.6 lb

## 2015-01-28 DIAGNOSIS — I6523 Occlusion and stenosis of bilateral carotid arteries: Secondary | ICD-10-CM

## 2015-01-28 DIAGNOSIS — I10 Essential (primary) hypertension: Secondary | ICD-10-CM | POA: Diagnosis not present

## 2015-01-28 MED ORDER — LISINOPRIL 20 MG PO TABS: 20.0000 mg | ORAL_TABLET | Freq: Every day | ORAL | Status: DC

## 2015-01-28 NOTE — Patient Instructions (Signed)
DASH Eating Plan DASH stands for "Dietary Approaches to Stop Hypertension." The DASH eating plan is a healthy eating plan that has been shown to reduce high blood pressure (hypertension). Additional health benefits may include reducing the risk of type 2 diabetes mellitus, heart disease, and stroke. The DASH eating plan may also help with weight loss. WHAT DO I NEED TO KNOW ABOUT THE DASH EATING PLAN? For the DASH eating plan, you will follow these general guidelines:  Choose foods with a percent daily value for sodium of less than 5% (as listed on the food label).  Use salt-free seasonings or herbs instead of table salt or sea salt.  Check with your health care provider or pharmacist before using salt substitutes.  Eat lower-sodium products, often labeled as "lower sodium" or "no salt added."  Eat fresh foods.  Eat more vegetables, fruits, and low-fat dairy products.  Choose whole grains. Look for the word "whole" as the first word in the ingredient list.  Choose fish and skinless chicken or turkey more often than red meat. Limit fish, poultry, and meat to 6 oz (170 g) each day.  Limit sweets, desserts, sugars, and sugary drinks.  Choose heart-healthy fats.  Limit cheese to 1 oz (28 g) per day.  Eat more home-cooked food and less restaurant, buffet, and fast food.  Limit fried foods.  Cook foods using methods other than frying.  Limit canned vegetables. If you do use them, rinse them well to decrease the sodium.  When eating at a restaurant, ask that your food be prepared with less salt, or no salt if possible. WHAT FOODS CAN I EAT? Seek help from a dietitian for individual calorie needs. Grains Whole grain or whole wheat bread. Brown rice. Whole grain or whole wheat pasta. Quinoa, bulgur, and whole grain cereals. Low-sodium cereals. Corn or whole wheat flour tortillas. Whole grain cornbread. Whole grain crackers. Low-sodium crackers. Vegetables Fresh or frozen vegetables  (raw, steamed, roasted, or grilled). Low-sodium or reduced-sodium tomato and vegetable juices. Low-sodium or reduced-sodium tomato sauce and paste. Low-sodium or reduced-sodium canned vegetables.  Fruits All fresh, canned (in natural juice), or frozen fruits. Meat and Other Protein Products Ground beef (85% or leaner), grass-fed beef, or beef trimmed of fat. Skinless chicken or turkey. Ground chicken or turkey. Pork trimmed of fat. All fish and seafood. Eggs. Dried beans, peas, or lentils. Unsalted nuts and seeds. Unsalted canned beans. Dairy Low-fat dairy products, such as skim or 1% milk, 2% or reduced-fat cheeses, low-fat ricotta or cottage cheese, or plain low-fat yogurt. Low-sodium or reduced-sodium cheeses. Fats and Oils Tub margarines without trans fats. Light or reduced-fat mayonnaise and salad dressings (reduced sodium). Avocado. Safflower, olive, or canola oils. Natural peanut or almond butter. Other Unsalted popcorn and pretzels. The items listed above may not be a complete list of recommended foods or beverages. Contact your dietitian for more options. WHAT FOODS ARE NOT RECOMMENDED? Grains White bread. White pasta. White rice. Refined cornbread. Bagels and croissants. Crackers that contain trans fat. Vegetables Creamed or fried vegetables. Vegetables in a cheese sauce. Regular canned vegetables. Regular canned tomato sauce and paste. Regular tomato and vegetable juices. Fruits Dried fruits. Canned fruit in light or heavy syrup. Fruit juice. Meat and Other Protein Products Fatty cuts of meat. Ribs, chicken wings, bacon, sausage, bologna, salami, chitterlings, fatback, hot dogs, bratwurst, and packaged luncheon meats. Salted nuts and seeds. Canned beans with salt. Dairy Whole or 2% milk, cream, half-and-half, and cream cheese. Whole-fat or sweetened yogurt. Full-fat   cheeses or blue cheese. Nondairy creamers and whipped toppings. Processed cheese, cheese spreads, or cheese  curds. Condiments Onion and garlic salt, seasoned salt, table salt, and sea salt. Canned and packaged gravies. Worcestershire sauce. Tartar sauce. Barbecue sauce. Teriyaki sauce. Soy sauce, including reduced sodium. Steak sauce. Fish sauce. Oyster sauce. Cocktail sauce. Horseradish. Ketchup and mustard. Meat flavorings and tenderizers. Bouillon cubes. Hot sauce. Tabasco sauce. Marinades. Taco seasonings. Relishes. Fats and Oils Butter, stick margarine, lard, shortening, ghee, and bacon fat. Coconut, palm kernel, or palm oils. Regular salad dressings. Other Pickles and olives. Salted popcorn and pretzels. The items listed above may not be a complete list of foods and beverages to avoid. Contact your dietitian for more information. WHERE CAN I FIND MORE INFORMATION? National [employer], [employer], [employer]: www.nhlbi.nih.gov/health/health-topics/topics/dash/ Document Released: 11/12/2011 Document Revised: 04/09/2014 Document Reviewed: 09/27/2013 ExitCare Patient Information 2015 [employer]. This information is not intended to replace advice given to you by your health care provider. Make sure you discuss any questions you have with your health care provider. Hypertension Hypertension, commonly called high blood pressure, is when the force of blood pumping through your arteries is too strong. Your arteries are the blood vessels that carry blood from your heart throughout your body. A blood pressure reading consists of a higher number over a lower number, such as 110/72. The higher number (systolic) is the pressure inside your arteries when your heart pumps. The lower number (diastolic) is the pressure inside your arteries when your heart relaxes. Ideally you want your blood pressure below 120/80. Hypertension forces your heart to work harder to pump blood. Your arteries may become narrow or stiff. Having hypertension puts you at risk for heart disease, stroke, and other problems.  RISK  FACTORS Some risk factors for high blood pressure are controllable. Others are not.  Risk factors you cannot control include:   Race. You may be at higher risk if you are African American.  Age. Risk increases with age.  Gender. Men are at higher risk than women before age 45 years. After age 65, women are at higher risk than men. Risk factors you can control include:  Not getting enough exercise or physical activity.  Being overweight.  Getting too much fat, sugar, calories, or salt in your diet.  Drinking too much alcohol. SIGNS AND SYMPTOMS Hypertension does not usually cause signs or symptoms. Extremely high blood pressure (hypertensive crisis) may cause headache, anxiety, shortness of breath, and nosebleed. DIAGNOSIS  To check if you have hypertension, your health care provider will measure your blood pressure while you are seated, with your arm held at the level of your heart. It should be measured at least twice using the same arm. Certain conditions can cause a difference in blood pressure between your right and left arms. A blood pressure reading that is higher than normal on one occasion does not mean that you need treatment. If one blood pressure reading is high, ask your health care provider about having it checked again. TREATMENT  Treating high blood pressure includes making lifestyle changes and possibly taking medicine. Living a healthy lifestyle can help lower high blood pressure. You may need to change some of your habits. Lifestyle changes may include:  Following the DASH diet. This diet is high in fruits, vegetables, and whole grains. It is low in salt, red meat, and added sugars.  Getting at least 2 hours of brisk physical activity every week.  Losing weight if necessary.  Not smoking.  Limiting   alcoholic beverages.  Learning ways to reduce stress. If lifestyle changes are not enough to get your blood pressure under control, your health care provider may  prescribe medicine. You may need to take more than one. Work closely with your health care provider to understand the risks and benefits. HOME CARE INSTRUCTIONS  Have your blood pressure rechecked as directed by your health care provider.   Take medicines only as directed by your health care provider. Follow the directions carefully. Blood pressure medicines must be taken as prescribed. The medicine does not work as well when you skip doses. Skipping doses also puts you at risk for problems.   Do not smoke.   Monitor your blood pressure at home as directed by your health care provider. SEEK MEDICAL CARE IF:   You think you are having a reaction to medicines taken.  You have recurrent headaches or feel dizzy.  You have swelling in your ankles.  You have trouble with your vision. SEEK IMMEDIATE MEDICAL CARE IF:  You develop a severe headache or confusion.  You have unusual weakness, numbness, or feel faint.  You have severe chest or abdominal pain.  You vomit repeatedly.  You have trouble breathing. MAKE SURE YOU:   Understand these instructions.  Will watch your condition.  Will get help right away if you are not doing well or get worse. Document Released: 11/23/2005 Document Revised: 04/09/2014 Document Reviewed: 09/15/2013 ExitCare Patient Information 2015 [employer]. This information is not intended to replace advice given to you by your health care provider. Make sure you discuss any questions you have with your health care provider.  

## 2015-01-28 NOTE — Progress Notes (Signed)
   Subjective:    [name], female    DOB: 1931/10/19, 81 y.o.   MRN: [id number]  Pt presents to the office for recheck of BP. Pt's BP is not at goal today. Hypertension This is a chronic problem. The current episode started more than 1 year ago. The problem has been waxing and waning since onset. The problem is uncontrolled. Associated symptoms include anxiety and peripheral edema. Pertinent negatives include no headaches, palpitations or shortness of breath. Risk factors for coronary artery disease include dyslipidemia, family history, obesity, post-menopausal state and sedentary lifestyle. Past treatments include beta blockers, calcium channel blockers and ACE inhibitors. The current treatment provides mild improvement. 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 There is no history of kidney disease, heart failure or a thyroid problem. There is no history of sleep apnea.      Review of Systems  Constitutional: Negative.   HENT: Negative.   Eyes: Negative.   Respiratory: Negative.  Negative for shortness of breath.   Cardiovascular: Negative.  Negative for palpitations.  Gastrointestinal: Negative.   Endocrine: Negative.   Genitourinary: Negative.   Musculoskeletal: Negative.   Neurological: Negative.  Negative for headaches.  Hematological: Negative.   Psychiatric/Behavioral: Negative.   All other systems reviewed and are negative.      Objective:   Physical Exam  Constitutional: She is oriented to person, place, and time. She appears well-developed and well-nourished. No distress.  HENT:  Head: Normocephalic and atraumatic.  Right Ear: External ear normal.  Left Ear: External ear normal.  Mouth/Throat: Oropharynx is clear and moist.  Eyes: Pupils are equal, round, and reactive to light.  Neck: Normal range of motion. Neck supple. No thyromegaly present.  Cardiovascular: Normal rate, regular rhythm, normal heart sounds and intact distal pulses.    No murmur heard. Pulmonary/Chest: Effort normal and breath sounds normal. No respiratory distress. She has no wheezes.  Abdominal: Soft. Bowel sounds are normal. She exhibits no distension. There is no tenderness.  Musculoskeletal: Normal range of motion. She exhibits edema (Mild edema in BLE). She exhibits no tenderness.  Neurological: She is alert and oriented to person, place, and time. She has normal reflexes. No cranial nerve deficit.  Skin: Skin is warm and dry.  Psychiatric: She has a normal mood and affect. Her behavior is normal. Judgment and thought content normal.  Vitals reviewed.     BP 140/69 mmHg  Pulse 63  Temp(Src) 96.7 F (35.9 C) (Oral)  Ht 5\' 6"  (1.676 m)  Wt 196 lb 9.6 oz (89.177 kg)  BMI 31.75 kg/m2     Assessment & Plan:  1. Essential hypertension -Pt's lisinopril increased to 20 mg daily Daily blood pressure log given with instructions on how to fill out and told to bring to next visit -Dash diet information given -Exercise encouraged - Stress Management  -Continue current meds -RTO in 2 weeks - lisinopril (PRINIVIL,ZESTRIL) 20 MG tablet; Take 1 tablet (20 mg total) by mouth daily.  Dispense: 90 tablet; Refill: [place], FNP

## 2015-02-04 ENCOUNTER — Other Ambulatory Visit: Payer: Self-pay | Admitting: *Deleted

## 2015-02-04 MED ORDER — LORAZEPAM 0.5 MG PO TABS: ORAL_TABLET | ORAL | Status: DC

## 2015-02-04 NOTE — Telephone Encounter (Signed)
Prescription refilled per pt's request

## 2015-02-04 NOTE — Telephone Encounter (Signed)
Last filled 01/08/15, last seen 01/28/15. Route to pool A, nurse to call into [facility]

## 2015-02-04 NOTE — Telephone Encounter (Signed)
Ativan called to [facility].

## 2015-02-06 ENCOUNTER — Other Ambulatory Visit: Payer: Self-pay | Admitting: Family Medicine

## 2015-02-07 ENCOUNTER — Other Ambulatory Visit: Payer: Self-pay | Admitting: Family Medicine

## 2015-02-11 ENCOUNTER — Encounter: Payer: Self-pay | Admitting: Family

## 2015-02-11 ENCOUNTER — Ambulatory Visit (INDEPENDENT_AMBULATORY_CARE_PROVIDER_SITE_OTHER): Payer: Medicare Other | Admitting: Family

## 2015-02-11 VITALS — BP 158/70 | HR 62 | Temp 97.2°F | Ht 66.0 in | Wt 192.8 lb

## 2015-02-11 DIAGNOSIS — I10 Essential (primary) hypertension: Secondary | ICD-10-CM

## 2015-02-11 NOTE — Progress Notes (Signed)
   Subjective:    [name], female    DOB: 10-03-1931, 81 y.o.   MRN: [id number]  Pt presents to the office to recheck BP. Pt's BP is not at goal, but considering her age better. Hypertension This is a new problem. The current episode started more than 1 year ago. The problem has been waxing and waning since onset. The problem is uncontrolled. Associated symptoms include shortness of breath (At times). Pertinent negatives include no anxiety, headaches, malaise/fatigue, palpitations or peripheral edema. Risk factors for coronary artery disease include dyslipidemia, family history, obesity, post-menopausal state and sedentary lifestyle. Past treatments include ACE inhibitors, calcium channel blockers, beta blockers and diuretics. The current treatment provides moderate improvement. There is no history of a thyroid problem. There is no history of sleep apnea.   *Daughter states she takes her BP at home and her avg is 130's/70's  Review of Systems  Constitutional: Negative.  Negative for malaise/fatigue.  HENT: Negative.   Eyes: Negative.   Respiratory: Positive for shortness of breath (At times).   Cardiovascular: Negative.  Negative for palpitations.  Gastrointestinal: Negative.   Endocrine: Negative.   Genitourinary: Negative.   Musculoskeletal: Negative.   Neurological: Negative.  Negative for headaches.  Hematological: Negative.   Psychiatric/Behavioral: Negative.   All other systems reviewed and are negative.      Objective:   Physical Exam  Constitutional: She is oriented to person, place, and time. She appears well-developed and well-nourished. No distress.  HENT:  Head: Normocephalic.  Eyes: Pupils are equal, round, and reactive to light.  Cardiovascular: Normal rate, regular rhythm and intact distal pulses.   Murmur heard. Pulmonary/Chest: Effort normal and breath sounds normal. No respiratory distress. She has no wheezes.  Abdominal: Soft. Bowel sounds  are normal. She exhibits no distension. There is no tenderness.  Musculoskeletal: Normal range of motion. She exhibits edema (mild edema). She exhibits no tenderness.  Neurological: She is alert and oriented to person, place, and time. No cranial nerve deficit.  Skin: Skin is warm and dry.  Psychiatric: She has a normal mood and affect. Her behavior is normal. Judgment and thought content normal.  Vitals reviewed.     BP 158/70 mmHg  Pulse 62  Temp(Src) 97.2 F (36.2 C) (Oral)  Ht 5\' 6"  (1.676 m)  Wt 192 lb 12.8 oz (87.454 kg)  BMI 31.13 kg/m2     Assessment & Plan:  1. Essential hypertension --Daily blood pressure log given with instructions on how to fill out and told to bring to next visit -Dash diet information given -Exercise encouraged - Stress Management  -Continue current meds -RTO in 3 months for chronic follow up  Evelina Dun, FNP

## 2015-02-11 NOTE — Patient Instructions (Signed)
DASH Eating Plan DASH stands for "Dietary Approaches to Stop Hypertension." The DASH eating plan is a healthy eating plan that has been shown to reduce high blood pressure (hypertension). Additional health benefits may include reducing the risk of type 2 diabetes mellitus, heart disease, and stroke. The DASH eating plan may also help with weight loss. WHAT DO I NEED TO KNOW ABOUT THE DASH EATING PLAN? For the DASH eating plan, you will follow these general guidelines:  Choose foods with a percent daily value for sodium of less than 5% (as listed on the food label).  Use salt-free seasonings or herbs instead of table salt or sea salt.  Check with your health care provider or pharmacist before using salt substitutes.  Eat lower-sodium products, often labeled as "lower sodium" or "no salt added."  Eat fresh foods.  Eat more vegetables, fruits, and low-fat dairy products.  Choose whole grains. Look for the word "whole" as the first word in the ingredient list.  Choose fish and skinless chicken or turkey more often than red meat. Limit fish, poultry, and meat to 6 oz (170 g) each day.  Limit sweets, desserts, sugars, and sugary drinks.  Choose heart-healthy fats.  Limit cheese to 1 oz (28 g) per day.  Eat more home-cooked food and less restaurant, buffet, and fast food.  Limit fried foods.  Cook foods using methods other than frying.  Limit canned vegetables. If you do use them, rinse them well to decrease the sodium.  When eating at a restaurant, ask that your food be prepared with less salt, or no salt if possible. WHAT FOODS CAN I EAT? Seek help from a dietitian for individual calorie needs. Grains Whole grain or whole wheat bread. Brown rice. Whole grain or whole wheat pasta. Quinoa, bulgur, and whole grain cereals. Low-sodium cereals. Corn or whole wheat flour tortillas. Whole grain cornbread. Whole grain crackers. Low-sodium crackers. Vegetables Fresh or frozen vegetables  (raw, steamed, roasted, or grilled). Low-sodium or reduced-sodium tomato and vegetable juices. Low-sodium or reduced-sodium tomato sauce and paste. Low-sodium or reduced-sodium canned vegetables.  Fruits All fresh, canned (in natural juice), or frozen fruits. Meat and Other Protein Products Ground beef (85% or leaner), grass-fed beef, or beef trimmed of fat. Skinless chicken or turkey. Ground chicken or turkey. Pork trimmed of fat. All fish and seafood. Eggs. Dried beans, peas, or lentils. Unsalted nuts and seeds. Unsalted canned beans. Dairy Low-fat dairy products, such as skim or 1% milk, 2% or reduced-fat cheeses, low-fat ricotta or cottage cheese, or plain low-fat yogurt. Low-sodium or reduced-sodium cheeses. Fats and Oils Tub margarines without trans fats. Light or reduced-fat mayonnaise and salad dressings (reduced sodium). Avocado. Safflower, olive, or canola oils. Natural peanut or almond butter. Other Unsalted popcorn and pretzels. The items listed above may not be a complete list of recommended foods or beverages. Contact your dietitian for more options. WHAT FOODS ARE NOT RECOMMENDED? Grains White bread. White pasta. White rice. Refined cornbread. Bagels and croissants. Crackers that contain trans fat. Vegetables Creamed or fried vegetables. Vegetables in a cheese sauce. Regular canned vegetables. Regular canned tomato sauce and paste. Regular tomato and vegetable juices. Fruits Dried fruits. Canned fruit in light or heavy syrup. Fruit juice. Meat and Other Protein Products Fatty cuts of meat. Ribs, chicken wings, bacon, sausage, bologna, salami, chitterlings, fatback, hot dogs, bratwurst, and packaged luncheon meats. Salted nuts and seeds. Canned beans with salt. Dairy Whole or 2% milk, cream, half-and-half, and cream cheese. Whole-fat or sweetened yogurt. Full-fat   cheeses or blue cheese. Nondairy creamers and whipped toppings. Processed cheese, cheese spreads, or cheese  curds. Condiments Onion and garlic salt, seasoned salt, table salt, and sea salt. Canned and packaged gravies. Worcestershire sauce. Tartar sauce. Barbecue sauce. Teriyaki sauce. Soy sauce, including reduced sodium. Steak sauce. Fish sauce. Oyster sauce. Cocktail sauce. Horseradish. Ketchup and mustard. Meat flavorings and tenderizers. Bouillon cubes. Hot sauce. Tabasco sauce. Marinades. Taco seasonings. Relishes. Fats and Oils Butter, stick margarine, lard, shortening, ghee, and bacon fat. Coconut, palm kernel, or palm oils. Regular salad dressings. Other Pickles and olives. Salted popcorn and pretzels. The items listed above may not be a complete list of foods and beverages to avoid. Contact your dietitian for more information. WHERE CAN I FIND MORE INFORMATION? National [employer], [employer], [employer]: www.nhlbi.nih.gov/health/health-topics/topics/dash/ Document Released: 11/12/2011 Document Revised: 04/09/2014 Document Reviewed: 09/27/2013 ExitCare Patient Information 2015 [employer]. This information is not intended to replace advice given to you by your health care provider. Make sure you discuss any questions you have with your health care provider. Hypertension Hypertension, commonly called high blood pressure, is when the force of blood pumping through your arteries is too strong. Your arteries are the blood vessels that carry blood from your heart throughout your body. A blood pressure reading consists of a higher number over a lower number, such as 110/72. The higher number (systolic) is the pressure inside your arteries when your heart pumps. The lower number (diastolic) is the pressure inside your arteries when your heart relaxes. Ideally you want your blood pressure below 120/80. Hypertension forces your heart to work harder to pump blood. Your arteries may become narrow or stiff. Having hypertension puts you at risk for heart disease, stroke, and other problems.  RISK  FACTORS Some risk factors for high blood pressure are controllable. Others are not.  Risk factors you cannot control include:   Race. You may be at higher risk if you are African American.  Age. Risk increases with age.  Gender. Men are at higher risk than women before age 45 years. After age 65, women are at higher risk than men. Risk factors you can control include:  Not getting enough exercise or physical activity.  Being overweight.  Getting too much fat, sugar, calories, or salt in your diet.  Drinking too much alcohol. SIGNS AND SYMPTOMS Hypertension does not usually cause signs or symptoms. Extremely high blood pressure (hypertensive crisis) may cause headache, anxiety, shortness of breath, and nosebleed. DIAGNOSIS  To check if you have hypertension, your health care provider will measure your blood pressure while you are seated, with your arm held at the level of your heart. It should be measured at least twice using the same arm. Certain conditions can cause a difference in blood pressure between your right and left arms. A blood pressure reading that is higher than normal on one occasion does not mean that you need treatment. If one blood pressure reading is high, ask your health care provider about having it checked again. TREATMENT  Treating high blood pressure includes making lifestyle changes and possibly taking medicine. Living a healthy lifestyle can help lower high blood pressure. You may need to change some of your habits. Lifestyle changes may include:  Following the DASH diet. This diet is high in fruits, vegetables, and whole grains. It is low in salt, red meat, and added sugars.  Getting at least 2 hours of brisk physical activity every week.  Losing weight if necessary.  Not smoking.  Limiting   alcoholic beverages.  Learning ways to reduce stress. If lifestyle changes are not enough to get your blood pressure under control, your health care provider may  prescribe medicine. You may need to take more than one. Work closely with your health care provider to understand the risks and benefits. HOME CARE INSTRUCTIONS  Have your blood pressure rechecked as directed by your health care provider.   Take medicines only as directed by your health care provider. Follow the directions carefully. Blood pressure medicines must be taken as prescribed. The medicine does not work as well when you skip doses. Skipping doses also puts you at risk for problems.   Do not smoke.   Monitor your blood pressure at home as directed by your health care provider. SEEK MEDICAL CARE IF:   You think you are having a reaction to medicines taken.  You have recurrent headaches or feel dizzy.  You have swelling in your ankles.  You have trouble with your vision. SEEK IMMEDIATE MEDICAL CARE IF:  You develop a severe headache or confusion.  You have unusual weakness, numbness, or feel faint.  You have severe chest or abdominal pain.  You vomit repeatedly.  You have trouble breathing. MAKE SURE YOU:   Understand these instructions.  Will watch your condition.  Will get help right away if you are not doing well or get worse. Document Released: 11/23/2005 Document Revised: 04/09/2014 Document Reviewed: 09/15/2013 ExitCare Patient Information 2015 [employer]. This information is not intended to replace advice given to you by your health care provider. Make sure you discuss any questions you have with your health care provider.  

## 2015-03-05 ENCOUNTER — Other Ambulatory Visit: Payer: Self-pay | Admitting: Family Medicine

## 2015-03-05 DIAGNOSIS — K2970 Gastritis, unspecified, without bleeding: Secondary | ICD-10-CM | POA: Diagnosis not present

## 2015-03-05 DIAGNOSIS — R112 Nausea with vomiting, unspecified: Secondary | ICD-10-CM | POA: Diagnosis not present

## 2015-03-06 ENCOUNTER — Inpatient Hospital Stay (HOSPITAL_COMMUNITY): Payer: Medicare Other

## 2015-03-06 ENCOUNTER — Inpatient Hospital Stay (HOSPITAL_COMMUNITY)
Admission: EM | Admit: 2015-03-06 | Discharge: 2015-03-11 | DRG: 871 | Disposition: A | Discharge status: Home / Self Care | Payer: Medicare Other | Attending: Internal Medicine | Admitting: Internal Medicine

## 2015-03-06 ENCOUNTER — Emergency Department (HOSPITAL_COMMUNITY): Payer: Medicare Other

## 2015-03-06 ENCOUNTER — Encounter (HOSPITAL_COMMUNITY): Payer: Self-pay | Admitting: Emergency Medicine

## 2015-03-06 DIAGNOSIS — N390 Urinary tract infection, site not specified: Secondary | ICD-10-CM | POA: Diagnosis present

## 2015-03-06 DIAGNOSIS — E119 Type 2 diabetes mellitus without complications: Secondary | ICD-10-CM

## 2015-03-06 DIAGNOSIS — K5730 Diverticulosis of large intestine without perforation or abscess without bleeding: Secondary | ICD-10-CM | POA: Diagnosis not present

## 2015-03-06 DIAGNOSIS — I69320 Aphasia following cerebral infarction: Secondary | ICD-10-CM | POA: Diagnosis not present

## 2015-03-06 DIAGNOSIS — Z951 Presence of aortocoronary bypass graft: Secondary | ICD-10-CM

## 2015-03-06 DIAGNOSIS — K219 Gastro-esophageal reflux disease without esophagitis: Secondary | ICD-10-CM | POA: Diagnosis present

## 2015-03-06 DIAGNOSIS — D696 Thrombocytopenia, unspecified: Secondary | ICD-10-CM | POA: Diagnosis not present

## 2015-03-06 DIAGNOSIS — R112 Nausea with vomiting, unspecified: Secondary | ICD-10-CM

## 2015-03-06 DIAGNOSIS — I2510 Atherosclerotic heart disease of native coronary artery without angina pectoris: Secondary | ICD-10-CM | POA: Diagnosis present

## 2015-03-06 DIAGNOSIS — T17908A Unspecified foreign body in respiratory tract, part unspecified causing other injury, initial encounter: Secondary | ICD-10-CM

## 2015-03-06 DIAGNOSIS — R509 Fever, unspecified: Secondary | ICD-10-CM | POA: Diagnosis not present

## 2015-03-06 DIAGNOSIS — R410 Disorientation, unspecified: Secondary | ICD-10-CM

## 2015-03-06 DIAGNOSIS — I5032 Chronic diastolic (congestive) heart failure: Secondary | ICD-10-CM | POA: Diagnosis not present

## 2015-03-06 DIAGNOSIS — A4151 Sepsis due to Escherichia coli [E. coli]: Secondary | ICD-10-CM | POA: Diagnosis not present

## 2015-03-06 DIAGNOSIS — A4150 Gram-negative sepsis, unspecified: Secondary | ICD-10-CM | POA: Diagnosis not present

## 2015-03-06 DIAGNOSIS — E871 Hypo-osmolality and hyponatremia: Secondary | ICD-10-CM | POA: Diagnosis not present

## 2015-03-06 DIAGNOSIS — E785 Hyperlipidemia, unspecified: Secondary | ICD-10-CM | POA: Diagnosis present

## 2015-03-06 DIAGNOSIS — R0902 Hypoxemia: Secondary | ICD-10-CM | POA: Diagnosis not present

## 2015-03-06 DIAGNOSIS — I129 Hypertensive chronic kidney disease with stage 1 through stage 4 chronic kidney disease, or unspecified chronic kidney disease: Secondary | ICD-10-CM | POA: Diagnosis not present

## 2015-03-06 DIAGNOSIS — G9341 Metabolic encephalopathy: Secondary | ICD-10-CM | POA: Diagnosis present

## 2015-03-06 DIAGNOSIS — I248 Other forms of acute ischemic heart disease: Secondary | ICD-10-CM | POA: Diagnosis not present

## 2015-03-06 DIAGNOSIS — I10 Essential (primary) hypertension: Secondary | ICD-10-CM | POA: Diagnosis not present

## 2015-03-06 DIAGNOSIS — M791 Myalgia: Secondary | ICD-10-CM | POA: Diagnosis not present

## 2015-03-06 DIAGNOSIS — R7881 Bacteremia: Secondary | ICD-10-CM | POA: Diagnosis not present

## 2015-03-06 DIAGNOSIS — R0602 Shortness of breath: Secondary | ICD-10-CM | POA: Diagnosis not present

## 2015-03-06 DIAGNOSIS — I639 Cerebral infarction, unspecified: Secondary | ICD-10-CM | POA: Diagnosis not present

## 2015-03-06 DIAGNOSIS — J9690 Respiratory failure, unspecified, unspecified whether with hypoxia or hypercapnia: Secondary | ICD-10-CM

## 2015-03-06 DIAGNOSIS — Z7902 Long term (current) use of antithrombotics/antiplatelets: Secondary | ICD-10-CM | POA: Diagnosis not present

## 2015-03-06 DIAGNOSIS — R1110 Vomiting, unspecified: Secondary | ICD-10-CM | POA: Diagnosis not present

## 2015-03-06 DIAGNOSIS — Z8639 Personal history of other endocrine, nutritional and metabolic disease: Secondary | ICD-10-CM | POA: Diagnosis not present

## 2015-03-06 DIAGNOSIS — Z955 Presence of coronary angioplasty implant and graft: Secondary | ICD-10-CM | POA: Diagnosis not present

## 2015-03-06 DIAGNOSIS — I5031 Acute diastolic (congestive) heart failure: Secondary | ICD-10-CM

## 2015-03-06 DIAGNOSIS — A419 Sepsis, unspecified organism: Secondary | ICD-10-CM | POA: Diagnosis not present

## 2015-03-06 DIAGNOSIS — Z794 Long term (current) use of insulin: Secondary | ICD-10-CM | POA: Diagnosis not present

## 2015-03-06 DIAGNOSIS — R6520 Severe sepsis without septic shock: Secondary | ICD-10-CM | POA: Diagnosis not present

## 2015-03-06 DIAGNOSIS — R7989 Other specified abnormal findings of blood chemistry: Secondary | ICD-10-CM | POA: Diagnosis not present

## 2015-03-06 DIAGNOSIS — M7918 Myalgia, other site: Secondary | ICD-10-CM

## 2015-03-06 DIAGNOSIS — R6883 Chills (without fever): Secondary | ICD-10-CM | POA: Diagnosis not present

## 2015-03-06 DIAGNOSIS — R778 Other specified abnormalities of plasma proteins: Secondary | ICD-10-CM | POA: Diagnosis present

## 2015-03-06 HISTORY — DX: Unspecified osteoarthritis, unspecified site: M19.90

## 2015-03-06 LAB — URINALYSIS, ROUTINE W REFLEX MICROSCOPIC
Bilirubin Urine: NEGATIVE
KETONES UR: NEGATIVE mg/dL
NITRITE: POSITIVE — AB
PROTEIN: 30 mg/dL — AB
Specific Gravity, Urine: 1.037 — ABNORMAL HIGH (ref 1.005–1.030)
UROBILINOGEN UA: 1 mg/dL (ref 0.0–1.0)
pH: 5 (ref 5.0–8.0)

## 2015-03-06 LAB — COMPREHENSIVE METABOLIC PANEL
ALK PHOS: 54 U/L (ref 39–117)
ALT: 14 U/L (ref 0–35)
ALT: 14 U/L (ref 0–35)
AST: 21 U/L (ref 0–37)
AST: 21 U/L (ref 0–37)
Albumin: 3.4 g/dL — ABNORMAL LOW (ref 3.5–5.2)
Albumin: 3.2 g/dL — ABNORMAL LOW (ref 3.5–5.2)
Alkaline Phosphatase: 48 U/L (ref 39–117)
Anion gap: 8 (ref 5–15)
Anion gap: 9 (ref 5–15)
BUN: 23 mg/dL (ref 6–23)
BUN: 24 mg/dL — ABNORMAL HIGH (ref 6–23)
CALCIUM: 9.5 mg/dL (ref 8.4–10.5)
CALCIUM: 9.2 mg/dL (ref 8.4–10.5)
CO2: 27 mmol/L (ref 19–32)
CO2: 26 mmol/L (ref 19–32)
CREATININE: 1.21 mg/dL — AB (ref 0.50–1.10)
CREATININE: 1.22 mg/dL — AB (ref 0.50–1.10)
Chloride: 101 mmol/L (ref 96–112)
Chloride: 99 mmol/L (ref 96–112)
GFR calc Af Amer: 47 mL/min — ABNORMAL LOW (ref >90)
GFR calc non Af Amer: 41 mL/min — ABNORMAL LOW (ref >90)
GFR calc non Af Amer: 40 mL/min — ABNORMAL LOW (ref >90)
GFR, EST AFRICAN AMERICAN: 47 mL/min — AB (ref >90)
GLUCOSE: 305 mg/dL — AB (ref 70–99)
Glucose, Bld: 285 mg/dL — ABNORMAL HIGH (ref 70–99)
POTASSIUM: 4 mmol/L (ref 3.5–5.1)
POTASSIUM: 4.1 mmol/L (ref 3.5–5.1)
SODIUM: 136 mmol/L (ref 135–145)
Sodium: 134 mmol/L — ABNORMAL LOW (ref 135–145)
Total Bilirubin: 0.6 mg/dL (ref 0.3–1.2)
Total Bilirubin: 0.5 mg/dL (ref 0.3–1.2)
Total Protein: 7 g/dL (ref 6.0–8.3)
Total Protein: 6.6 g/dL (ref 6.0–8.3)

## 2015-03-06 LAB — CBC WITH DIFFERENTIAL/PLATELET
BASOS PCT: 0 % (ref 0–1)
Basophils Absolute: 0 10*3/uL (ref 0.0–0.1)
Basophils Absolute: 0 10*3/uL (ref 0.0–0.1)
Basophils Relative: 0 % (ref 0–1)
EOS ABS: 0 10*3/uL (ref 0.0–0.7)
EOS PCT: 0 % (ref 0–5)
Eosinophils Absolute: 0 10*3/uL (ref 0.0–0.7)
Eosinophils Relative: 0 % (ref 0–5)
HCT: 35.5 % — ABNORMAL LOW (ref 36.0–46.0)
HEMATOCRIT: 35.2 % — AB (ref 36.0–46.0)
Hemoglobin: 11.7 g/dL — ABNORMAL LOW (ref 12.0–15.0)
Hemoglobin: 11.5 g/dL — ABNORMAL LOW (ref 12.0–15.0)
LYMPHS PCT: 2 % — AB (ref 12–46)
Lymphocytes Relative: 3 % — ABNORMAL LOW (ref 12–46)
Lymphs Abs: 0.2 10*3/uL — ABNORMAL LOW (ref 0.7–4.0)
Lymphs Abs: 0.3 10*3/uL — ABNORMAL LOW (ref 0.7–4.0)
MCH: 28.9 pg (ref 26.0–34.0)
MCH: 28.8 pg (ref 26.0–34.0)
MCHC: 33 g/dL (ref 30.0–36.0)
MCHC: 32.7 g/dL (ref 30.0–36.0)
MCV: 87.7 fL (ref 78.0–100.0)
MCV: 88 fL (ref 78.0–100.0)
MONOS PCT: 1 % — AB (ref 3–12)
Monocytes Absolute: 0.1 10*3/uL (ref 0.1–1.0)
Monocytes Absolute: 0.3 10*3/uL (ref 0.1–1.0)
Monocytes Relative: 3 % (ref 3–12)
NEUTROS ABS: 7.4 10*3/uL (ref 1.7–7.7)
NEUTROS ABS: 11.9 10*3/uL — AB (ref 1.7–7.7)
NEUTROS PCT: 95 % — AB (ref 43–77)
Neutrophils Relative %: 96 % — ABNORMAL HIGH (ref 43–77)
Platelets: 132 10*3/uL — ABNORMAL LOW (ref 150–400)
Platelets: 128 10*3/uL — ABNORMAL LOW (ref 150–400)
RBC: 4.05 MIL/uL (ref 3.87–5.11)
RBC: 4 MIL/uL (ref 3.87–5.11)
RDW: 14 % (ref 11.5–15.5)
RDW: 13.8 % (ref 11.5–15.5)
WBC: 7.7 10*3/uL (ref 4.0–10.5)
WBC: 12.5 10*3/uL — ABNORMAL HIGH (ref 4.0–10.5)

## 2015-03-06 LAB — TROPONIN I
Troponin I: 0.44 ng/mL — ABNORMAL HIGH (ref <0.031)
Troponin I: 0.45 ng/mL — ABNORMAL HIGH (ref <0.031)
Troponin I: 0.35 ng/mL — ABNORMAL HIGH (ref <0.031)

## 2015-03-06 LAB — GLUCOSE, CAPILLARY
GLUCOSE-CAPILLARY: 133 mg/dL — AB (ref 70–99)
GLUCOSE-CAPILLARY: 188 mg/dL — AB (ref 70–99)
GLUCOSE-CAPILLARY: 86 mg/dL (ref 70–99)
Glucose-Capillary: 307 mg/dL — ABNORMAL HIGH (ref 70–99)
Glucose-Capillary: 275 mg/dL — ABNORMAL HIGH (ref 70–99)

## 2015-03-06 LAB — LIPID PANEL
CHOLESTEROL: 131 mg/dL (ref 0–200)
HDL: 48 mg/dL (ref >39)
LDL CALC: 55 mg/dL (ref 0–99)
TRIGLYCERIDES: 141 mg/dL (ref <150)
Total CHOL/HDL Ratio: 2.7 RATIO
VLDL: 28 mg/dL (ref 0–40)

## 2015-03-06 LAB — TSH: TSH: 1.167 u[IU]/mL (ref 0.350–4.500)

## 2015-03-06 LAB — URINE MICROSCOPIC-ADD ON

## 2015-03-06 LAB — LIPASE, BLOOD: Lipase: 23 U/L (ref 11–59)

## 2015-03-06 MED ORDER — PAROXETINE HCL 20 MG PO TABS
20.0000 mg | ORAL_TABLET | Freq: Every day | ORAL | Status: DC
  Administered 2015-03-06 – 2015-03-07 (×2): 20 mg via ORAL
  Filled 2015-03-06 (×2): qty 1

## 2015-03-06 MED ORDER — LISINOPRIL 20 MG PO TABS
20.0000 mg | ORAL_TABLET | Freq: Every day | ORAL | Status: DC
  Administered 2015-03-06: 20 mg via ORAL
  Filled 2015-03-06 (×2): qty 1

## 2015-03-06 MED ORDER — PANTOPRAZOLE SODIUM 40 MG PO TBEC
80.0000 mg | DELAYED_RELEASE_TABLET | Freq: Every day | ORAL | Status: DC
  Administered 2015-03-06 – 2015-03-11 (×6): 80 mg via ORAL
  Filled 2015-03-06 (×7): qty 2

## 2015-03-06 MED ORDER — METOPROLOL TARTRATE 50 MG PO TABS
50.0000 mg | ORAL_TABLET | Freq: Two times a day (BID) | ORAL | Status: DC
  Administered 2015-03-06 – 2015-03-07 (×3): 50 mg via ORAL
  Filled 2015-03-06 (×4): qty 1

## 2015-03-06 MED ORDER — HEPARIN SODIUM (PORCINE) 5000 UNIT/ML IJ SOLN
5000.0000 [IU] | Freq: Three times a day (TID) | INTRAMUSCULAR | Status: DC
  Administered 2015-03-06 (×2): 5000 [IU] via SUBCUTANEOUS
  Filled 2015-03-06 (×2): qty 1

## 2015-03-06 MED ORDER — DEXTROSE 5 % IV SOLN
1.0000 g | INTRAVENOUS | Status: DC
  Administered 2015-03-06 – 2015-03-07 (×2): 1 g via INTRAVENOUS
  Filled 2015-03-06 (×2): qty 10

## 2015-03-06 MED ORDER — ASPIRIN EC 325 MG PO TBEC
325.0000 mg | DELAYED_RELEASE_TABLET | Freq: Every day | ORAL | Status: DC
  Administered 2015-03-06 – 2015-03-11 (×6): 325 mg via ORAL
  Filled 2015-03-06 (×6): qty 1

## 2015-03-06 MED ORDER — FUROSEMIDE 20 MG PO TABS
20.0000 mg | ORAL_TABLET | Freq: Every day | ORAL | Status: DC | PRN
  Filled 2015-03-06: qty 1

## 2015-03-06 MED ORDER — LORAZEPAM 0.5 MG PO TABS
0.5000 mg | ORAL_TABLET | Freq: Two times a day (BID) | ORAL | Status: DC
  Filled 2015-03-06: qty 1

## 2015-03-06 MED ORDER — INSULIN NPH (HUMAN) (ISOPHANE) 100 UNIT/ML ~~LOC~~ SUSP
60.0000 [IU] | Freq: Every day | SUBCUTANEOUS | Status: DC
  Administered 2015-03-06: 60 [IU] via SUBCUTANEOUS
  Filled 2015-03-06: qty 10

## 2015-03-06 MED ORDER — NITROGLYCERIN 2 % TD OINT
1.0000 [in_us] | TOPICAL_OINTMENT | Freq: Four times a day (QID) | TRANSDERMAL | Status: DC
  Administered 2015-03-06 (×2): 1 [in_us] via TOPICAL
  Filled 2015-03-06: qty 30
  Filled 2015-03-06: qty 1

## 2015-03-06 MED ORDER — IOHEXOL 300 MG/ML  SOLN
50.0000 mL | Freq: Once | INTRAMUSCULAR | Status: AC | PRN
  Administered 2015-03-06: 50 mL via ORAL

## 2015-03-06 MED ORDER — INSULIN ASPART 100 UNIT/ML ~~LOC~~ SOLN
0.0000 [IU] | SUBCUTANEOUS | Status: DC
  Administered 2015-03-06: 1 [IU] via SUBCUTANEOUS
  Administered 2015-03-06 (×2): 5 [IU] via SUBCUTANEOUS
  Administered 2015-03-06: 2 [IU] via SUBCUTANEOUS
  Administered 2015-03-07: 1 [IU] via SUBCUTANEOUS
  Administered 2015-03-08: 5 [IU] via SUBCUTANEOUS
  Administered 2015-03-08: 7 [IU] via SUBCUTANEOUS
  Administered 2015-03-08: 2 [IU] via SUBCUTANEOUS
  Administered 2015-03-09: 3 [IU] via SUBCUTANEOUS
  Administered 2015-03-09: 1 [IU] via SUBCUTANEOUS
  Administered 2015-03-09: 5 [IU] via SUBCUTANEOUS
  Administered 2015-03-09 (×3): 2 [IU] via SUBCUTANEOUS
  Administered 2015-03-10 (×4): 3 [IU] via SUBCUTANEOUS
  Administered 2015-03-10: 1 [IU] via SUBCUTANEOUS
  Administered 2015-03-10: 3 [IU] via SUBCUTANEOUS
  Administered 2015-03-11 (×4): 2 [IU] via SUBCUTANEOUS
  Filled 2015-03-06: qty 1

## 2015-03-06 MED ORDER — SODIUM CHLORIDE 0.9 % IV BOLUS (SEPSIS)
500.0000 mL | Freq: Once | INTRAVENOUS | Status: AC
  Administered 2015-03-06: 500 mL via INTRAVENOUS

## 2015-03-06 MED ORDER — CLOPIDOGREL BISULFATE 75 MG PO TABS
75.0000 mg | ORAL_TABLET | Freq: Every day | ORAL | Status: DC
  Administered 2015-03-06 – 2015-03-11 (×6): 75 mg via ORAL
  Filled 2015-03-06 (×7): qty 1

## 2015-03-06 MED ORDER — IOHEXOL 300 MG/ML  SOLN
100.0000 mL | Freq: Once | INTRAMUSCULAR | Status: AC | PRN
  Administered 2015-03-06: 80 mL via INTRAVENOUS

## 2015-03-06 MED ORDER — SODIUM CHLORIDE 0.9 % IV SOLN
INTRAVENOUS | Status: DC
  Administered 2015-03-06 (×3): via INTRAVENOUS

## 2015-03-06 MED ORDER — SODIUM CHLORIDE 0.9 % IJ SOLN
3.0000 mL | Freq: Two times a day (BID) | INTRAMUSCULAR | Status: DC
Start: 2015-03-06 — End: 2015-03-11
  Administered 2015-03-06 – 2015-03-11 (×10): 3 mL via INTRAVENOUS

## 2015-03-06 MED ORDER — INSULIN NPH (HUMAN) (ISOPHANE) 100 UNIT/ML ~~LOC~~ SUSP
20.0000 [IU] | Freq: Every day | SUBCUTANEOUS | Status: DC
  Filled 2015-03-06: qty 10

## 2015-03-06 MED ORDER — NITROGLYCERIN 0.4 MG SL SUBL: 0.4000 mg | SUBLINGUAL_TABLET | SUBLINGUAL | Status: DC | PRN

## 2015-03-06 MED ORDER — NITROGLYCERIN 2 % TD OINT
1.0000 [in_us] | TOPICAL_OINTMENT | Freq: Four times a day (QID) | TRANSDERMAL | Status: DC
  Filled 2015-03-06: qty 30

## 2015-03-06 NOTE — Progress Notes (Signed)
Patient not administered AM 60un of novolog due to absence of food.  Will administer for lunch time with food. Colletta Maryland RN

## 2015-03-06 NOTE — Progress Notes (Signed)
RN called to room by patient's daughter.  Daughter stated that patient was "acting funny," RN assessed new onset confusion.  RN obtained Vital Signs (BP 125/47 mmHg  Pulse 84  Temp(Src) 98.2 F (36.8 C) (Oral)  Resp 20  Ht 5\' 6"  (1.676 m)  Wt 85.9 kg (189 lb 6 oz)  BMI 30.58 kg/m2  SpO2 98%.  RN also paged MD Erlinda Hong, reported vital signs to MD.  MD stated to obtain urine culture by in and out cath.  RN obtained and sent to laboratory.  Blood sugar 188.  Patient's daughter stated that patient "acts like this after heparin every time."  RN relayed information to MD who ordered CT scan.  RN added Heparin to allergy list.  Will continue to monitor/wait for additional orders.

## 2015-03-06 NOTE — ED Notes (Signed)
Spoke with patient daughter to confirm pt has has several CVA's in past with deficits to leaving slowed speech, weakness to right eye and facial droop, witch was noted on initial nursing exam. Pt also has weakness to left arm that was clarified by daughter as pt has previous injury to that arm.

## 2015-03-06 NOTE — H&P (Signed)
Hospitalist Admission History and Physical  Patient name: [name] Medical record number: [id number] Date of birth: 1931-09-05 Age: 81 y.o. Gender: female  Primary Care Provider: Sharion Balloon, FNP  Chief Complaint: elevated trop, likely UTI   History of Present Illness:This is a 81 y.o. year old female with significant past medical history of dementia, multiple CVAs, IDDM, chronic diastolic heart failure, CAD s/p stents presenting with elevated trop, likely UTI. Family report patient with vomiting over the course of the day. Also reports of lower abdominal pain. Patient fairly poorly functioning at baseline. No other significant complaints per family. Patient did report having some mild intermittent chest pain about one week ago. Otherwise history not equivocal. Family and patient deny any diarrhea, fever or chills. No headache. Presented to the [facility] ER temperature 97.8, heart rate in the 90s, respirations 20s, blood pressure in the 140s to 170s, satting 100% on supplemental oxygen. White blood cell count 7.7, hemoglobin 11.7, creatinine 1.21, troponin 0.44. EKG with nonspecific T-wave changes per ER physician report (formal EKG not available currently). Glucose 285. CT of abdomen and pelvis obtained showing perinephric stranding in both kidneys with greater enhancement on the right concerning for possible UTI. Nonspecific colonic wall thickening.  EDP discussed case w/ cards fellow at [facility]. Recommend admission to hospitalist and transfer to [facility].  Assessment and Plan: [name] is a 81 y.o. year old female presenting with elevated trop, likely UTI    Active Problems:   Elevated troponin   1- Elevated trop  -No active CP, though baseline CAD -noted myoview 07/2013 w/ no evidence of ischemia/infarct -full dose ASA -nitropaste -cycle CEs -risk stratification labs -tele bed -transfer to [facility]  -f/u cards recs   2-Likely UTI  -noted suprapubic pain with CT imaging  concerning for perinephric stranding -UA pending -start IV rocephin empirically  -urine culture  -follow   3-CAD  -no active CP in setting of above -full dose ASA -cont home regimen  -tele bed   4-Chronic diastolic heart failure -no records of ECHO in epic -2D ECHO  -euvolemic to dry on exam  -strict Is and Os, daily weights   5-IDDM -SSI  -NPH  -A1C -hold orals  FEN/GI: heart healthy-carb modified diet  Prophylaxis: sub q heparin  Disposition: pending further evaluation  Code Status:Full Code    Patient Active Problem List   Diagnosis Date Noted  . Elevated troponin 03/06/2015  . Hyperlipidemia 01/11/2015  . GAD (generalized anxiety disorder) 01/11/2015  . Depression 01/11/2015  . GERD (gastroesophageal reflux disease) 01/11/2015  . Osteopenia 07/13/2014  . Obesity (BMI 30-39.9) 06/19/2014  . Carotid stenosis   . Anemia of chronic disease 12/25/2011  . Iron deficiency anemia 05/27/2011  . ACUTE DIASTOLIC HEART FAILURE 123456  . Type 2 diabetes mellitus with insulin therapy 10/08/2009  . DYSLIPIDEMIA 10/08/2009  . Essential hypertension 10/08/2009  . Coronary atherosclerosis 10/08/2009  . CAROTID STENOSIS 10/08/2009   Past Medical History: Past Medical History  Diagnosis Date  . Anemia   . Diabetes mellitus   . Hypertension   . Iron deficiency anemia 05/27/2011  . CHF (congestive heart failure)     EF preserved Echo 2012  . CVA (cerebral infarction)   . Carotid stenosis   . Renal insufficiency   . Furuncle of back, except buttock   . Vertigo   . CAD (coronary artery disease)     Past Surgical History: Past Surgical History  Procedure Laterality Date  . Cholecystectomy    .  I & d of furuncle  April 2013  . Total abdominal hysterectomy      complete  . Coronary artery bypass graft  2006    Social History: History   Social History  . Marital Status: Widowed    Spouse Name: N/A  . Number of Children: N/A  . Years of Education: N/A    Social History Main Topics  . Smoking status: Never Smoker   . Smokeless tobacco: Never Used  . Alcohol Use: No  . Drug Use: No  . Sexual Activity: Not Currently   Other Topics Concern  . None   Social History Narrative    Family History: Family History  Problem Relation Age of Onset  . Cancer Brother     porstate  . Early death Sister   . Heart disease Brother   . Heart disease Brother   . Heart attack Brother   . Heart disease Brother   . Heart attack Brother   . Diabetes Sister   . Heart attack Sister   . Diabetes Sister   . Osteoporosis Sister   . Hypertension Sister     Allergies: Allergies  Allergen Reactions  . Propoxyphene N-Acetaminophen Other (See Comments)    Numbness all over. "floating" sensation.    Current Facility-Administered Medications  Medication Dose Route Frequency Provider Last Rate Last Dose  . 0.9 %  sodium chloride infusion   Intravenous Continuous Deneise Lever, MD      . aspirin EC tablet 325 mg  325 mg Oral Daily Deneise Lever, MD      . clopidogrel (PLAVIX) tablet 75 mg  75 mg Oral Daily Deneise Lever, MD      . furosemide (LASIX) tablet 20 mg  20 mg Oral Daily PRN Deneise Lever, MD      . heparin injection 5,000 Units  5,000 Units Subcutaneous 3 times per day Deneise Lever, MD      . insulin aspart (novoLOG) injection 0-9 Units  0-9 Units Subcutaneous 6 times per day Deneise Lever, MD      . insulin NPH Human (HUMULIN N,NOVOLIN N) injection 20 Units  20 Units Subcutaneous QAC supper Deneise Lever, MD      . insulin NPH Human (HUMULIN N,NOVOLIN N) injection 60 Units  60 Units Subcutaneous QAC breakfast Deneise Lever, MD      . lisinopril (PRINIVIL,ZESTRIL) tablet 20 mg  20 mg Oral Daily Deneise Lever, MD      . LORazepam (ATIVAN) tablet 0.5 mg  0.5 mg Oral BID Deneise Lever, MD      . metoprolol (LOPRESSOR) tablet 50 mg  50 mg Oral BID Deneise Lever, MD      . nitroGLYCERIN (NITROSTAT) SL tablet 0.4 mg  0.4 mg  Sublingual Q5 min PRN Deneise Lever, MD      . pantoprazole (PROTONIX) EC tablet 80 mg  80 mg Oral Q1200 Deneise Lever, MD      . PARoxetine (PAXIL) tablet 20 mg  20 mg Oral Daily Deneise Lever, MD      . sodium chloride 0.9 % injection 3 mL  3 mL Intravenous Q12H Deneise Lever, MD       Current Outpatient Prescriptions  Medication Sig Dispense Refill  . amLODipine (NORVASC) 10 MG tablet Take 1 tablet (10 mg total) by mouth daily. 30 tablet 11  . atorvastatin (LIPITOR) 20 MG tablet Take 1 tablet (20 mg total) by mouth  at bedtime. 30 tablet 11  . canagliflozin (INVOKANA) 100 MG TABS tablet Take 1 tablet (100 mg total) by mouth daily. 90 tablet 3  . clopidogrel (PLAVIX) 75 MG tablet Take 1 tablet (75 mg total) by mouth daily. 30 tablet 11  . EASY TOUCH INSULIN SYRINGE 31G X 5/16" 1 ML MISC USE AS DIRECTED. 80 each 2  . insulin NPH Human (HUMULIN N) 100 UNIT/ML injection Inject 65 units each morning with breakfast and 28 units each evening with supper. 40 mL 11  . lisinopril (PRINIVIL,ZESTRIL) 20 MG tablet Take 1 tablet (20 mg total) by mouth daily. 90 tablet 3  . LORazepam (ATIVAN) 0.5 MG tablet TAKE (1) TABLET TWICE DAILY. 60 tablet 1  . meclizine (ANTIVERT) 25 MG tablet TAKE 1 TABLET 2 TIMES A DAY AS NEEDED FOR DIZZINESS. 60 tablet 3  . metFORMIN (GLUCOPHAGE XR) 500 MG 24 hr tablet Take 1 tablet with breakfast and 1 tablet with supper 60 tablet 11  . metoprolol (LOPRESSOR) 50 MG tablet Take 1 tablet (50 mg total) by mouth 2 (two) times daily. 60 tablet 11  . NITROSTAT 0.4 MG SL tablet PLACE ONE (1) TABLET UNDER TONGUE EVERY 5 MINUTES UP TO (3) DOSES AS NEEDED FOR CHEST PAIN. 25 tablet 2  . PARoxetine (PAXIL) 20 MG tablet Take 1 tablet (20 mg total) by mouth daily. 30 tablet 11  . Artificial Tear Ointment (DRY EYES OP) Apply 1 drop to eye daily as needed. For dry eye/ red eye     . aspirin 81 MG EC tablet Take 81 mg by mouth at bedtime.     Marland Kitchen esomeprazole (NEXIUM) 40 MG capsule Take 1  capsule (40 mg total) by mouth daily as needed (FO ACID REFLUX/GERD). 30 capsule 11  . furosemide (LASIX) 20 MG tablet Take 1 tablet (20 mg total) by mouth daily as needed for fluid or edema. 30 tablet 5  . Multiple Vitamins-Iron (DAILY VITAMINS/IRON/BETA CAROT PO) Take 1 tablet by mouth at bedtime.     Review Of Systems: 12 point ROS negative except as noted above in HPI.  Physical Exam: Filed Vitals:   03/06/15 0330  BP: 153/67  Pulse:   Temp:   Resp:     General: confused, appears older than stated age  83: PERRLA and extra ocular movement intact Heart: S1, S2 normal, no murmur, rub or gallop, regular rate and rhythm Lungs: clear to auscultation, no wheezes or rales and unlabored breathing Abdomen: + bowel sounds, + mild suprapubic tenderness Extremities: extremities normal, atraumatic, no cyanosis or edema Skin:no rashes Neurology: normal without focal findings  Labs and Imaging: Lab Results  Component Value Date/Time   NA 136 03/06/2015 01:20 AM   NA 142 01/11/2015 09:20 AM   K 4.0 03/06/2015 01:20 AM   CL 101 03/06/2015 01:20 AM   CO2 27 03/06/2015 01:20 AM   BUN 23 03/06/2015 01:20 AM   BUN 18 01/11/2015 09:20 AM   CREATININE 1.21* 03/06/2015 01:20 AM   CREATININE 1.30* 05/29/2013 09:21 AM   GLUCOSE 285* 03/06/2015 01:20 AM   GLUCOSE 48* 01/11/2015 09:20 AM   Lab Results  Component Value Date   WBC 7.7 03/06/2015   HGB 11.7* 03/06/2015   HCT 35.5* 03/06/2015   MCV 87.7 03/06/2015   PLT 132* 03/06/2015   Urinalysis      Ct Abdomen Pelvis W Contrast  03/06/2015   CLINICAL DATA:  Sudden onset of vomiting 3 hours prior.  Chills.  EXAM: CT ABDOMEN AND PELVIS  WITH CONTRAST  TECHNIQUE: Multidetector CT imaging of the abdomen and pelvis was performed using the standard protocol following bolus administration of intravenous contrast.  CONTRAST:  35mL OMNIPAQUE IOHEXOL 300 MG/ML SOLN, 62mL OMNIPAQUE IOHEXOL 300 MG/ML SOLN  COMPARISON:  08/27/2014  FINDINGS: The  included lung bases are clear. There are old right-sided rib fractures. The heart is mildly enlarged. Mitral annulus and coronary artery calcifications are seen.  Clips in the gallbladder fossa from cholecystectomy. Focal calcification the falciform ligament is unchanged. There is no focal hepatic lesion. No biliary dilatation. Spleen, adrenal glands, and pancreas are normal.  There is perinephric stranding about both kidneys, heterogeneous enhancement, right greater than left. No hydronephrosis, there is symmetric renal excretion. Small bilateral renal hypodensities bilaterally, too small to characterize, suspect cysts.  Stomach is minimally distended. There are no dilated or thickened bowel loops. The appendix not visualized, no pericecal inflammatory change. Equivocal colonic wall thickening involving the transverse colon versus nondistention, nondistention is favored given lack of surrounding inflammatory change. Small volume of stool throughout the ascending and descending colon. There distal colonic diverticular without diverticulitis. Scattered mesenteric calcifications may reflect granulomas in sequela of prior granulomatous disease versus phleboliths.  Bladder is physiologically distended. The uterus is surgically absent. The ovaries are not seen, no adnexal mass.  Abdominal aorta is normal in caliber moderate atherosclerosis. No retroperitoneal adenopathy. No free air, free fluid, or intra-abdominal fluid collection.  There are no acute or suspicious osseous abnormalities. Compression deformity of L3 is again seen, not significantly changed. There is multilevel degenerative change throughout spine.  IMPRESSION: 1. Perinephric stranding about both kidneys with question of heterogeneous enhancement on the right. Correlation with urinalysis recommended to exclude urinary tract infection. 2. Equivocal colonic wall thickening of the transverse colon versus nondistention, nondistention is favored given lack of  surrounding inflammatory change.   Electronically Signed   By: Jeb Levering M.D.   On: 03/06/2015 02:57           Shanda Howells MD  Pager: [phone]

## 2015-03-06 NOTE — ED Notes (Signed)
Repeat blood sugar 205

## 2015-03-06 NOTE — ED Provider Notes (Signed)
CSN: [id number]     Arrival date & time 03/06/15  0037 History  This chart was scribed for Veryl Speak, MD by Randa Evens, ED Scribe. This patient was seen in room APA02/APA02 and the patient's care was started at 12:53 AM.    Chief Complaint  Patient presents with  . Emesis   The history is provided by the patient and a relative. No language interpreter was used.   HPI Comments: [name] is a 81 y.o. female brought in by ambulance, who presents to the Emergency Department complaining of sudden new vomiting onset 3 hours PTA. Daughter reports chills. Daughter states she has been around sick contacts. Pt has had Zofran in route that has provided some relief. Pt presents with slurred speech from previous CVA. Daughter states she thinks that pt is confused. PT reports left hip pain. Pt has a HX of hysterectomy.    Past Medical History  Diagnosis Date  . Anemia   . Diabetes mellitus   . Hypertension   . Iron deficiency anemia 05/27/2011  . CHF (congestive heart failure)     EF preserved Echo 2012  . CVA (cerebral infarction)   . Carotid stenosis   . Renal insufficiency   . Furuncle of back, except buttock   . Vertigo   . CAD (coronary artery disease)    Past Surgical History  Procedure Laterality Date  . Cholecystectomy    . I & d of furuncle  April 2013  . Total abdominal hysterectomy      complete  . Coronary artery bypass graft  2006   Family History  Problem Relation Age of Onset  . Cancer Brother     porstate  . Early death Sister   . Heart disease Brother   . Heart disease Brother   . Heart attack Brother   . Heart disease Brother   . Heart attack Brother   . Diabetes Sister   . Heart attack Sister   . Diabetes Sister   . Osteoporosis Sister   . Hypertension Sister    History  Substance Use Topics  . Smoking status: Never Smoker   . Smokeless tobacco: Never Used  . Alcohol Use: No   OB History    No data available     Review of Systems   Constitutional: Positive for chills. Negative for fever.  Gastrointestinal: Positive for nausea and vomiting. Negative for diarrhea.  All other systems reviewed and are negative.     Allergies  Propoxyphene n-acetaminophen  Home Medications   Prior to Admission medications   Medication Sig Start Date End Date Taking? Authorizing Provider  amLODipine (NORVASC) 10 MG tablet Take 1 tablet (10 mg total) by mouth daily. 09/20/14  Yes Lysbeth Penner, FNP  atorvastatin (LIPITOR) 20 MG tablet Take 1 tablet (20 mg total) by mouth at bedtime. 09/20/14  Yes Lysbeth Penner, FNP  canagliflozin (INVOKANA) 100 MG TABS tablet Take 1 tablet (100 mg total) by mouth daily. 01/14/15  Yes Sharion Balloon, FNP  clopidogrel (PLAVIX) 75 MG tablet Take 1 tablet (75 mg total) by mouth daily. 09/20/14  Yes Lysbeth Penner, FNP  EASY TOUCH INSULIN SYRINGE 31G X 5/16" 1 ML MISC USE AS DIRECTED. 02/07/15  Yes Chipper Herb, MD  insulin NPH Human (HUMULIN N) 100 UNIT/ML injection Inject 65 units each morning with breakfast and 28 units each evening with supper. 09/20/14  Yes Lysbeth Penner, FNP  lisinopril (PRINIVIL,ZESTRIL) 20 MG tablet Take 1  tablet (20 mg total) by mouth daily. 01/28/15  Yes Sharion Balloon, FNP  LORazepam (ATIVAN) 0.5 MG tablet TAKE (1) TABLET TWICE DAILY. 02/04/15  Yes Sharion Balloon, FNP  meclizine (ANTIVERT) 25 MG tablet TAKE 1 TABLET 2 TIMES A DAY AS NEEDED FOR DIZZINESS. 11/10/14  Yes Lysbeth Penner, FNP  metFORMIN (GLUCOPHAGE XR) 500 MG 24 hr tablet Take 1 tablet with breakfast and 1 tablet with supper 09/20/14  Yes Lysbeth Penner, FNP  metoprolol (LOPRESSOR) 50 MG tablet Take 1 tablet (50 mg total) by mouth 2 (two) times daily. 09/20/14  Yes Orson Ape Oxford, FNP  NITROSTAT 0.4 MG SL tablet PLACE ONE (1) TABLET UNDER TONGUE EVERY 5 MINUTES UP TO (3) DOSES AS NEEDED FOR CHEST PAIN. 10/10/14  Yes Lysbeth Penner, FNP  PARoxetine (PAXIL) 20 MG tablet Take 1 tablet (20 mg total) by mouth  daily. 09/20/14  Yes Lysbeth Penner, FNP  Artificial Tear Ointment (DRY EYES OP) Apply 1 drop to eye daily as needed. For dry eye/ red eye     Historical Provider, MD  aspirin 81 MG EC tablet Take 81 mg by mouth at bedtime.     Historical Provider, MD  esomeprazole (NEXIUM) 40 MG capsule Take 1 capsule (40 mg total) by mouth daily as needed (FO ACID REFLUX/GERD). 09/20/14   Lysbeth Penner, FNP  furosemide (LASIX) 20 MG tablet Take 1 tablet (20 mg total) by mouth daily as needed for fluid or edema. 09/20/14   Lysbeth Penner, FNP  Multiple Vitamins-Iron (DAILY VITAMINS/IRON/BETA CAROT PO) Take 1 tablet by mouth at bedtime.    Historical Provider, MD   BP 171/73 mmHg  Pulse 91  Temp(Src) 97.8 F (36.6 C) (Oral)  Resp 26  Wt 190 lb (86.183 kg)  SpO2 90%   Physical Exam  Constitutional: She is oriented to person, place, and time. She appears well-developed and well-nourished. No distress.  HENT:  Head: Normocephalic and atraumatic.  Eyes: Conjunctivae and EOM are normal.  Neck: Neck supple. No tracheal deviation present.  Cardiovascular: Normal rate.   Pulmonary/Chest: Effort normal. No respiratory distress.  Abdominal: Soft.  Musculoskeletal: Normal range of motion.  Neurological: She is alert and oriented to person, place, and time.  Skin: Skin is warm and dry.  Psychiatric: She has a normal mood and affect. Her behavior is normal.  Nursing note and vitals reviewed.   ED Course  Procedures (including critical care time) DIAGNOSTIC STUDIES: Oxygen Saturation is 90% on RA, low by my interpretation.    COORDINATION OF CARE: 1:02 AM-Discussed treatment plan with pt at bedside and pt agreed to plan.     Labs Review Labs Reviewed - No data to display  Imaging Review No results found.  ED ECG REPORT   Date: 03/06/2015  Rate: 93  Rhythm: normal sinus rhythm  QRS Axis: normal  Intervals: normal  ST/T Wave abnormalities: nonspecific T wave changes  Conduction  Disutrbances:none  Narrative Interpretation:   Old EKG Reviewed: unchanged  I have personally reviewed the EKG tracing and agree with the computerized printout as noted.   MDM   Final diagnoses:  None     Patient with significant history of coronary artery disease with bypass surgery 10 years ago, CVA, and diabetes. She presents with vomiting that started several hours prior to arrival. She denies any chest discomfort, but is somewhat of a difficult historian due to speech difficulties arising from her prior stroke. Workup reveals no elevation of white  count, however her laboratory studies do reveal an elevated troponin. I have not seen this troponin elevated in the past and her renal function is in the upper limit of normal. I've discussed this with Dr. Saunders Revel who is on-call for cardiology who is recommending admission to medicine and observation. I have also spoken with Dr. Ernestina Patches from the hospitalist service who will evaluate patient and likely transfer her to [facility] for cardiology consultation.   I personally performed the services described in this documentation, which was scribed in my presence. The recorded information has been reviewed and is accurate.      Veryl Speak, MD 03/06/15 [phone]

## 2015-03-06 NOTE — Progress Notes (Addendum)
PROGRESS NOTE  [name] [id number] DOB: June 05, 1931 DOA: 03/06/2015 PCP: Sharion Balloon, FNP  HPI/Recap of past 24 hours: Feeling warm, c/o left buttock pain, tow daughters in room, also reported patient has problem swallowing  Assessment/Plan: Active Problems:   Elevated troponin  1- Elevated trop , likely demand ischemia, EKg inferolateral St depression, possible chronic  -No active CP, though baseline CAD -noted myoview 07/2013 w/ no evidence of ischemia/infarct -full dose ASA -nitropaste -cycle CEs. Echo pending -risk stratification labs -tele bed -f/u cards recs   2-fever /leukocytosis: uti? Aspiration? Also reported left buttock pain, though Ct ab/pelvic no correlated findings to left buttock -noted suprapubic pain with CT imaging concerning for perinephric stranding -UA pending -start IV rocephin empirically  -urine culture, blood culture, cxr -swallow eval/start soft diet for now/aspiration precaution  3-CAD s/p CABG, last stress test 2014, no acute findings -no active CP in setting of above -full dose ASA -cont home regimen  -tele bed  -cards consulted  4-Chronic diastolic heart failure -2D ECHO pending -euvolemic to dry on exam  -strict Is and Os, daily weights , on gentle hydration currently  5-IDDM -SSI  -NPH  -A1C -hold orals  6. H/o cva/carotid stenosis, at baseline with right sided facial droop/slurred speech/partical visual field loss. Will get PT/speech  FEN/GI: heart healthy-carb modified diet/soft diet  Prophylaxis: sub q heparin  Disposition: pending further evaluation  Code Status:Full Code   Family Communication: patient and two daughters     Consultants:  cardiology  Procedures:  none  Antibiotics:  rocephin   Objective: BP 117/45 mmHg  Pulse 78  Temp(Src) 99.4 F (37.4 C) (Oral)  Resp 20  Ht 5\' 6"  (1.676 m)  Wt 85.9 kg (189 lb 6 oz)  BMI 30.58 kg/m2  SpO2 97%  Intake/Output Summary (Last  24 hours) at 03/06/15 1227 Last data filed at 03/06/15 0848  Gross per 24 hour  Intake      0 ml  Output      0 ml  Net      0 ml   Filed Weights   03/06/15 0051 03/06/15 0739  Weight: 86.183 kg (190 lb) 85.9 kg (189 lb 6 oz)    Exam:   General:  NAD, frail  Cardiovascular: RRR  Respiratory: CTABL  Abdomen: Soft/ND/NT, positive BS  Musculoskeletal: No Edema  Neuro: chronic deficit from prior cva 34yrs ago, no new findings  Skin, left buttock unremarkable, no induration, no erythema, no significant tender.  Data Reviewed: Basic Metabolic Panel:  Recent Labs Lab 03/06/15 0120 03/06/15 0406  NA 136 134*  K 4.0 4.1  CL 101 99  CO2 27 26  GLUCOSE 285* 305*  BUN 23 24*  CREATININE 1.21* 1.22*  CALCIUM 9.5 9.2   Liver Function Tests:  Recent Labs Lab 03/06/15 0120 03/06/15 0406  AST 21 21  ALT 14 14  ALKPHOS 54 48  BILITOT 0.6 0.5  PROT 7.0 6.6  ALBUMIN 3.4* 3.2*    Recent Labs Lab 03/06/15 0120  LIPASE 23   No results for input(s): AMMONIA in the last 168 hours. CBC:  Recent Labs Lab 03/06/15 0120 03/06/15 0406  WBC 7.7 12.5*  NEUTROABS 7.4 11.9*  HGB 11.7* 11.5*  HCT 35.5* 35.2*  MCV 87.7 88.0  PLT 132* 128*   Cardiac Enzymes:    Recent Labs Lab 03/06/15 0120 03/06/15 0406  TROPONINI 0.44* 0.45*   BNP (last 3 results) No results for input(s): BNP in the last 8760 hours.  ProBNP (last 3 results) No results for input(s): PROBNP in the last 8760 hours.  CBG:  Recent Labs Lab 03/06/15 0808 03/06/15 1219  GLUCAP 307* 275*    No results found for this or any previous visit (from the past 240 hour(s)).   Studies: No results found.  Scheduled Meds: . aspirin EC  325 mg Oral Daily  . cefTRIAXone (ROCEPHIN)  IV  1 g Intravenous Q24H  . clopidogrel  75 mg Oral Daily  . heparin  5,000 Units Subcutaneous 3 times per day  . insulin aspart  0-9 Units Subcutaneous 6 times per day  . insulin NPH Human  20 Units Subcutaneous QAC  supper  . insulin NPH Human  60 Units Subcutaneous QAC breakfast  . lisinopril  20 mg Oral Daily  . LORazepam  0.5 mg Oral BID  . metoprolol  50 mg Oral BID  . nitroGLYCERIN  1 inch Topical 4 times per day  . pantoprazole  80 mg Oral Q1200  . PARoxetine  20 mg Oral Daily  . sodium chloride  3 mL Intravenous Q12H    Continuous Infusions: . sodium chloride 75 mL/hr at 03/06/15 1120     Time spent: 71mins From 11:30 am  Tynasia Mccaul MD, PhD  [facility] Pager [phone]. If 7PM-7AM, please contact night-coverage at [email or web address], password [facility] 03/06/2015, 12:27 PM  LOS: 0 days     Prolonged service provided including direct patient cares/talking to family members/coordination of cares. Start: 11:30 to 12:30

## 2015-03-06 NOTE — Evaluation (Addendum)
Clinical/Bedside Swallow Evaluation Patient Details  Name: [name] MRN: [id number] Date of Birth: 1931/01/02  Today's Date: 03/06/2015 Time: SLP Start Time (ACUTE ONLY): 53 SLP Stop Time (ACUTE ONLY): 1440 SLP Time Calculation (min) (ACUTE ONLY): 30 min  Past Medical History:  Past Medical History  Diagnosis Date  . Anemia   . Diabetes mellitus   . Hypertension   . Iron deficiency anemia 05/27/2011  . CHF (congestive heart failure)     EF preserved Echo 2012  . CVA (cerebral infarction)   . Carotid stenosis   . Renal insufficiency   . Furuncle of back, except buttock   . Vertigo   . CAD (coronary artery disease)   . Arthritis    Past Surgical History:  Past Surgical History  Procedure Laterality Date  . Cholecystectomy    . I & d of furuncle  April 2013  . Total abdominal hysterectomy      complete  . Coronary artery bypass graft  2006   HPI:  Pt is an 81 y.o. female with PMH of dementia, multiple CVAs, IDDM, chronic diastolic heart failure, CAD s/p stents presenting with elevated trop, likely UTI. Pt vomiting over course of the day on 3/29. Lower abdominal pain. Poorly functioning at baseline. CXR unremarkable on 3/30. Pt with fever/ leukocytosis. Daughter reported problems swallowing. Bedside swallow eval ordered to aide in ruling out aspiration.   Assessment / Plan / Recommendation Clinical Impression  Pt demonstrated no overt s/s of aspiration at bedside. Suspect a mildly delayed swallow with adequate hyolaryngeal excursion. Prolonged mastication with cracker. Pt describes globus sensation in throat with dry solids and reports that it happens occasionally with liquids. Reported that difficulties began with stroke 10 years ago. Given that CXR is clear and no overt s/s of aspiration during evaluation, aspiration risk appears mild at this time with a modified diet. Recommend initiating a dysphagia 3 diet, thin liquids, meds whole with liquid, intermittent supervision  to cue for strategies- small bites/ sips, sit upright 30-60 minutes after meal, alternate food/ liquids. Educated pt and daughter on recommendations- all in agreement and verbalized understanding. SLP will sign off at this time- please re-consult if needs arise.    Aspiration Risk  Mild    Diet Recommendation Dysphagia 3 (Mechanical Soft);Thin liquid   Liquid Administration via: Cup;Straw Medication Administration: Whole meds with liquid Supervision: Patient able to self feed;Intermittent supervision to cue for compensatory strategies Compensations: Slow rate;Small sips/bites Postural Changes and/or Swallow Maneuvers: Seated upright 90 degrees;Upright 30-60 min after meal    Other  Recommendations Oral Care Recommendations: Oral care BID Other Recommendations: Clarify dietary restrictions   Follow Up Recommendations  None    Frequency and Duration        Pertinent Vitals/Pain None     SLP Swallow Goals     Swallow Study Prior Functional Status       General HPI: Pt is an 81 y.o. female with PMH of dementia, multiple CVAs, IDDM, chronic diastolic heart failure, CAD s/p stents presenting with elevated trop, likely UTI. Pt vomiting over course of the day on 3/29. Lower abdominal pain. Poorly functioning at baseline. CXR unremarkable on 3/30. Pt with fever/ leukocytosis. Daughter reported problems swallowing. Bedside swallow eval ordered to aide in ruling out aspiration. Type of Study: Bedside swallow evaluation Diet Prior to this Study: Thin liquids;Other (Comment) (soft) Temperature Spikes Noted: No Respiratory Status: Nasal cannula History of Recent Intubation: No Behavior/Cognition: Alert;Cooperative;Pleasant mood Oral Cavity - Dentition: Dentures, top Self-Feeding  Abilities: Able to feed self Patient Positioning: Upright in bed Baseline Vocal Quality: Clear Volitional Cough: Strong Volitional Swallow: Able to elicit    Oral/Motor/Sensory Function Overall Oral  Motor/Sensory Function: Impaired at baseline Labial ROM: Within Functional Limits Labial Symmetry: Within Functional Limits Labial Strength: Within Functional Limits Lingual ROM: Within Functional Limits Lingual Symmetry: Other (Comment) (deviates to right side) Lingual Strength: Within Functional Limits   Ice Chips Ice chips: Not tested   Thin Liquid Thin Liquid: Impaired Presentation: Cup;Straw Pharyngeal  Phase Impairments: Suspected delayed Swallow;Multiple swallows    Nectar Thick Nectar Thick Liquid: Not tested   Honey Thick Honey Thick Liquid: Not tested   Puree Puree: Within functional limits Presentation: Self Fed;Spoon   Solid   GO    Solid: Impaired Presentation: Self Fed Oral Phase Impairments: Impaired mastication       Oleksiak, Amy K, MA, CCC-SLP 03/06/2015,2:46 PM  [phone]

## 2015-03-06 NOTE — Progress Notes (Signed)
RN called to reported that patient has new onset of confusion, vital stable, family reported patient has h/o confusion after subQ heparin, will d/c heparin/ start SCD, ua/urine culture/third Cardiac enzyme still  Pending, Ct head ordered. Ct head on acute findings, will continue monitor.

## 2015-03-06 NOTE — ED Notes (Signed)
Patient presents to ER via [facility] for abdominal pain, nausea and vomiting.  Patient has slurred speech from previous CVA.  Daughter states patient is confused.

## 2015-03-06 NOTE — Telephone Encounter (Signed)
Last seen 02/11/15 [name]  This med not on EPIC

## 2015-03-06 NOTE — Progress Notes (Signed)
UR COMPLETED  

## 2015-03-07 ENCOUNTER — Inpatient Hospital Stay (HOSPITAL_COMMUNITY): Payer: Medicare Other

## 2015-03-07 DIAGNOSIS — I2510 Atherosclerotic heart disease of native coronary artery without angina pectoris: Secondary | ICD-10-CM

## 2015-03-07 DIAGNOSIS — R6520 Severe sepsis without septic shock: Secondary | ICD-10-CM

## 2015-03-07 DIAGNOSIS — R0902 Hypoxemia: Secondary | ICD-10-CM

## 2015-03-07 DIAGNOSIS — R509 Fever, unspecified: Secondary | ICD-10-CM

## 2015-03-07 DIAGNOSIS — R6521 Severe sepsis with septic shock: Secondary | ICD-10-CM

## 2015-03-07 DIAGNOSIS — M791 Myalgia: Secondary | ICD-10-CM

## 2015-03-07 DIAGNOSIS — A419 Sepsis, unspecified organism: Secondary | ICD-10-CM

## 2015-03-07 DIAGNOSIS — R7881 Bacteremia: Secondary | ICD-10-CM

## 2015-03-07 DIAGNOSIS — N390 Urinary tract infection, site not specified: Secondary | ICD-10-CM

## 2015-03-07 LAB — BLOOD GAS, ARTERIAL
Acid-Base Excess: 1.3 mmol/L (ref 0.0–2.0)
Bicarbonate: 25.7 mEq/L — ABNORMAL HIGH (ref 20.0–24.0)
O2 CONTENT: 2 L/min
O2 Saturation: 95.7 %
PATIENT TEMPERATURE: 101.6
PO2 ART: 85.8 mmHg (ref 80.0–100.0)
TCO2: 27 mmol/L (ref 0–100)
pCO2 arterial: 47.2 mmHg — ABNORMAL HIGH (ref 35.0–45.0)
pH, Arterial: 7.364 (ref 7.350–7.450)

## 2015-03-07 LAB — CBC WITH DIFFERENTIAL/PLATELET
BASOS ABS: 0 10*3/uL (ref 0.0–0.1)
BASOS PCT: 0 % (ref 0–1)
Eosinophils Absolute: 0 10*3/uL (ref 0.0–0.7)
Eosinophils Relative: 0 % (ref 0–5)
HCT: 30.9 % — ABNORMAL LOW (ref 36.0–46.0)
Hemoglobin: 9.8 g/dL — ABNORMAL LOW (ref 12.0–15.0)
Lymphocytes Relative: 11 % — ABNORMAL LOW (ref 12–46)
Lymphs Abs: 1.1 10*3/uL (ref 0.7–4.0)
MCH: 28 pg (ref 26.0–34.0)
MCHC: 31.7 g/dL (ref 30.0–36.0)
MCV: 88.3 fL (ref 78.0–100.0)
MONO ABS: 0.7 10*3/uL (ref 0.1–1.0)
MONOS PCT: 7 % (ref 3–12)
NEUTROS ABS: 8.6 10*3/uL — AB (ref 1.7–7.7)
NEUTROS PCT: 83 % — AB (ref 43–77)
Platelets: 109 10*3/uL — ABNORMAL LOW (ref 150–400)
RBC: 3.5 MIL/uL — ABNORMAL LOW (ref 3.87–5.11)
RDW: 14.5 % (ref 11.5–15.5)
WBC: 10.4 10*3/uL (ref 4.0–10.5)

## 2015-03-07 LAB — HEMOGLOBIN A1C
Hgb A1c MFr Bld: 8.7 % — ABNORMAL HIGH (ref 4.8–5.6)
Mean Plasma Glucose: 203 mg/dL

## 2015-03-07 LAB — LACTIC ACID, PLASMA
LACTIC ACID, VENOUS: 1.2 mmol/L (ref 0.5–2.0)
Lactic Acid, Venous: 1.2 mmol/L (ref 0.5–2.0)

## 2015-03-07 LAB — COMPREHENSIVE METABOLIC PANEL
ALK PHOS: 39 U/L (ref 39–117)
ALT: 13 U/L (ref 0–35)
AST: 21 U/L (ref 0–37)
Albumin: 2.6 g/dL — ABNORMAL LOW (ref 3.5–5.2)
Anion gap: 7 (ref 5–15)
BUN: 23 mg/dL (ref 6–23)
CALCIUM: 8.8 mg/dL (ref 8.4–10.5)
CHLORIDE: 101 mmol/L (ref 96–112)
CO2: 26 mmol/L (ref 19–32)
Creatinine, Ser: 1.28 mg/dL — ABNORMAL HIGH (ref 0.50–1.10)
GFR calc non Af Amer: 38 mL/min — ABNORMAL LOW (ref >90)
GFR, EST AFRICAN AMERICAN: 44 mL/min — AB (ref >90)
GLUCOSE: 92 mg/dL (ref 70–99)
Potassium: 3.8 mmol/L (ref 3.5–5.1)
SODIUM: 134 mmol/L — AB (ref 135–145)
Total Bilirubin: 0.5 mg/dL (ref 0.3–1.2)
Total Protein: 5.8 g/dL — ABNORMAL LOW (ref 6.0–8.3)

## 2015-03-07 LAB — TROPONIN I: Troponin I: 0.63 ng/mL (ref <0.031)

## 2015-03-07 LAB — GLUCOSE, CAPILLARY
Glucose-Capillary: 102 mg/dL — ABNORMAL HIGH (ref 70–99)
Glucose-Capillary: 116 mg/dL — ABNORMAL HIGH (ref 70–99)
Glucose-Capillary: 121 mg/dL — ABNORMAL HIGH (ref 70–99)
Glucose-Capillary: 122 mg/dL — ABNORMAL HIGH (ref 70–99)
Glucose-Capillary: 147 mg/dL — ABNORMAL HIGH (ref 70–99)
Glucose-Capillary: 152 mg/dL — ABNORMAL HIGH (ref 70–99)
Glucose-Capillary: 91 mg/dL (ref 70–99)

## 2015-03-07 LAB — MRSA PCR SCREENING: MRSA BY PCR: NEGATIVE

## 2015-03-07 MED ORDER — SODIUM CHLORIDE 0.9 % IV SOLN
INTRAVENOUS | Status: DC
  Administered 2015-03-07: 21:00:00 via INTRAVENOUS
  Administered 2015-03-09: 50 mL/h via INTRAVENOUS

## 2015-03-07 MED ORDER — SODIUM CHLORIDE 0.9 % IJ SOLN: 10.0000 mL | INTRAMUSCULAR | Status: DC | PRN

## 2015-03-07 MED ORDER — RISAQUAD PO CAPS
1.0000 | ORAL_CAPSULE | Freq: Every day | ORAL | Status: DC
  Administered 2015-03-08 – 2015-03-11 (×4): 1 via ORAL
  Filled 2015-03-07 (×7): qty 1

## 2015-03-07 MED ORDER — PIPERACILLIN-TAZOBACTAM 3.375 G IVPB
3.3750 g | Freq: Three times a day (TID) | INTRAVENOUS | Status: DC
  Administered 2015-03-07 – 2015-03-11 (×13): 3.375 g via INTRAVENOUS
  Filled 2015-03-07 (×17): qty 50

## 2015-03-07 MED ORDER — VANCOMYCIN HCL 10 G IV SOLR
1250.0000 mg | INTRAVENOUS | Status: DC
  Administered 2015-03-08: 1250 mg via INTRAVENOUS
  Filled 2015-03-07: qty 1250

## 2015-03-07 MED ORDER — VANCOMYCIN HCL 10 G IV SOLR
1750.0000 mg | Freq: Once | INTRAVENOUS | Status: AC
  Administered 2015-03-07: 1750 mg via INTRAVENOUS
  Filled 2015-03-07: qty 1750

## 2015-03-07 MED ORDER — METOPROLOL TARTRATE 1 MG/ML IV SOLN
2.5000 mg | INTRAVENOUS | Status: DC | PRN
  Administered 2015-03-09 – 2015-03-10 (×2): 5 mg via INTRAVENOUS
  Administered 2015-03-10: 2.5 mg via INTRAVENOUS
  Administered 2015-03-11: 5 mg via INTRAVENOUS
  Filled 2015-03-07 (×4): qty 5

## 2015-03-07 MED ORDER — ACETAMINOPHEN 325 MG PO TABS: 650.0000 mg | ORAL_TABLET | Freq: Four times a day (QID) | ORAL | Status: DC | PRN

## 2015-03-07 MED ORDER — SODIUM CHLORIDE 0.9 % IJ SOLN
10.0000 mL | Freq: Two times a day (BID) | INTRAMUSCULAR | Status: DC
  Administered 2015-03-07 – 2015-03-11 (×8): 10 mL

## 2015-03-07 MED ORDER — IBUPROFEN 200 MG PO TABS
200.0000 mg | ORAL_TABLET | Freq: Once | ORAL | Status: AC
  Administered 2015-03-07: 200 mg via ORAL
  Filled 2015-03-07: qty 1

## 2015-03-07 NOTE — Progress Notes (Signed)
  Echocardiogram 2D Echocardiogram has been performed.  [name] 03/07/2015, 3:57 PM

## 2015-03-07 NOTE — Progress Notes (Signed)
Peripherally Inserted Central Catheter/Midline Placement  The IV Nurse has discussed with the patient and/or persons authorized to consent for the patient, the purpose of this procedure and the potential benefits and risks involved with this procedure.  The benefits include less needle sticks, lab draws from the catheter and patient may be discharged home with the catheter.  Risks include, but not limited to, infection, bleeding, blood clot (thrombus formation), and puncture of an artery; nerve damage and irregular heat beat.  Alternatives to this procedure were also discussed.  PICC/Midline Placement Documentation  PICC / Midline Double Lumen [id number] PICC Right Basilic 44 cm 1 cm (Active)  Indication for Insertion or Continuance of Line Limited venous access - need for IV therapy >5 days (PICC only) 03/07/2015  8:20 PM  Exposed Catheter (cm) 1 cm 03/07/2015  8:20 PM  Site Assessment Clean;Dry;Intact 03/07/2015  8:20 PM  Lumen #1 Status Flushed;Saline locked 03/07/2015  8:20 PM  Lumen #2 Status Flushed;Saline locked 03/07/2015  8:20 PM  Dressing Type Transparent 03/07/2015  8:20 PM  Dressing Status Clean;Dry;Intact;Antimicrobial disc in place 03/07/2015  8:20 PM  Dressing Intervention New dressing 03/07/2015  8:20 PM  Dressing Change Due 03/14/15 03/07/2015  8:20 PM       Aldona Lento L 03/07/2015, 8:37 PM

## 2015-03-07 NOTE — Progress Notes (Signed)
IV RN at bedside placing PICC line.

## 2015-03-07 NOTE — Consult Note (Signed)
PULMONARY / CRITICAL CARE MEDICINE   Name: [name] MRN: [id number] DOB: Aug 24, 1931    ADMISSION DATE:  03/06/2015 CONSULTATION DATE:  03/07/2015  REFERRING MD :  Erlinda Hong  CHIEF COMPLAINT:  Fevers  INITIAL PRESENTATION:  81 y.o. F brought to [facility] 3/30 with UTI and troponin bump.  Spiked fever 3/31, PCCM consulted.  BCx's positive for GNR's.    STUDIES:  CXR 3/31 >>> CHF, interstitial edema. CT Head 3/30 >>> stable chronic small vessel ischemic changes and old infarcts in right occipital lobe and cerebellum.  No acute findings. CT A/P 3/30 >>> perinephric stranding about both kidneys with ? Enhancement on right.  Equivocal colonic wall thickening on transverse colon versus non-distention.  SIGNIFICANT EVENTS: 3/30 - admit.  Became confused, head CT neg 3/31 - PCCM consulted for fevers.  BCx's pos for GNR's.   HISTORY OF PRESENT ILLNESS:   [name] is a 81 y.o. F with PMH as outlined below who presented to [facility] 3/30 for vomiting, "the shakes", and suprapubic pain.  One week prior, pt had chest pain that resolved on it's own.  Denied any lightheadedness, SOB, diaphoresis, syncope.  No fevers/chills/sweats. In ED, found to have UTI and mild troponin bump (0.44).  CT abd / pelv with perinephric stranding in both kidneys with greater enhancement on the right and non-specific colonic wall thickening. She was transferred to [facility] and admitted by [facility].  Later that day, pt had increasing confusion and "not acting right".  CT head obtained and negative for acute findings.  On 3/31, pt spiked fever to 103.  She was also very somnolent; however, per daughters, she is always sleepy when she gets sick.  PCCM consulted for high fevers.  [facility] progress note mentioned concern for aspiration during breakfast; however, per daughter, pt never choked or coughed and had no difficulty swallowing at all.  Daughter reports that pt completed breakfast and felt fine afterwards.  In addition, [facility] progress notes  state that pt is limited code with no intubation.  After mentioning this to pt and her family (both daughters); they stated that that was not their wish.  They asked for code status to be changed back to full code and were clear that they would want intubation if needed.  They did however state that pt would not want prolonged ventilatory support; short term fine.  Of note, blood cultures returned at 2/2 positive for GNR.   PAST MEDICAL HISTORY :   has a past medical history of Anemia; Diabetes mellitus; Hypertension; Iron deficiency anemia (05/27/2011); CHF (congestive heart failure); CVA (cerebral infarction); Carotid stenosis; Renal insufficiency; Furuncle of back, except buttock; Vertigo; CAD (coronary artery disease); and Arthritis.  has past surgical history that includes Cholecystectomy; I & D of Furuncle (April 2013); Total abdominal hysterectomy; and Coronary artery bypass graft (2006). Prior to Admission medications   Medication Sig Start Date End Date Taking? Authorizing Provider  amLODipine (NORVASC) 10 MG tablet Take 1 tablet (10 mg total) by mouth daily. 09/20/14  Yes Lysbeth Penner, FNP  Artificial Tear Ointment (DRY EYES OP) Apply 1 drop to eye daily as needed. For dry eye/ red eye    Yes Historical Provider, MD  aspirin 81 MG EC tablet Take 81 mg by mouth at bedtime.    Yes Historical Provider, MD  atorvastatin (LIPITOR) 20 MG tablet Take 1 tablet (20 mg total) by mouth at bedtime. 09/20/14  Yes Lysbeth Penner, FNP  canagliflozin (INVOKANA) 100 MG TABS tablet  Take 1 tablet (100 mg total) by mouth daily. 01/14/15  Yes Sharion Balloon, FNP  clopidogrel (PLAVIX) 75 MG tablet Take 1 tablet (75 mg total) by mouth daily. 09/20/14  Yes Lysbeth Penner, FNP  EASY TOUCH INSULIN SYRINGE 31G X 5/16" 1 ML MISC USE AS DIRECTED. 02/07/15  Yes Chipper Herb, MD  esomeprazole (NEXIUM) 40 MG capsule Take 1 capsule (40 mg total) by mouth daily as needed (FO ACID REFLUX/GERD). 09/20/14  Yes Lysbeth Penner, FNP  furosemide (LASIX) 20 MG tablet Take 1 tablet (20 mg total) by mouth daily as needed for fluid or edema. 09/20/14  Yes Lysbeth Penner, FNP  insulin NPH Human (HUMULIN N) 100 UNIT/ML injection Inject 65 units each morning with breakfast and 28 units each evening with supper. 09/20/14  Yes Lysbeth Penner, FNP  lisinopril (PRINIVIL,ZESTRIL) 20 MG tablet Take 1 tablet (20 mg total) by mouth daily. 01/28/15  Yes Sharion Balloon, FNP  LORazepam (ATIVAN) 0.5 MG tablet TAKE (1) TABLET TWICE DAILY. 02/04/15  Yes Sharion Balloon, FNP  metFORMIN (GLUCOPHAGE XR) 500 MG 24 hr tablet Take 1 tablet with breakfast and 1 tablet with supper 09/20/14  Yes Lysbeth Penner, FNP  metoprolol (LOPRESSOR) 50 MG tablet Take 1 tablet (50 mg total) by mouth 2 (two) times daily. 09/20/14  Yes Lysbeth Penner, FNP  Multiple Vitamins-Iron (DAILY VITAMINS/IRON/BETA CAROT PO) Take 1 tablet by mouth at bedtime.   Yes Historical Provider, MD  NITROSTAT 0.4 MG SL tablet PLACE ONE (1) TABLET UNDER TONGUE EVERY 5 MINUTES UP TO (3) DOSES AS NEEDED FOR CHEST PAIN. 10/10/14  Yes Lysbeth Penner, FNP  PARoxetine (PAXIL) 20 MG tablet Take 1 tablet (20 mg total) by mouth daily. 09/20/14  Yes Lysbeth Penner, FNP  meclizine (ANTIVERT) 25 MG tablet TAKE 1 TABLET TWICE DAILY AS NEEDED FOR DIZZINESS. 03/06/15   Sharion Balloon, FNP   Allergies  Allergen Reactions  . Heparin Other (See Comments)    Confusion  . Propoxyphene N-Acetaminophen Other (See Comments)    Numbness all over. "floating" sensation.    FAMILY HISTORY:  Family History  Problem Relation Age of Onset  . Cancer Brother     porstate  . Early death Sister   . Heart disease Brother   . Heart disease Brother   . Heart attack Brother   . Heart disease Brother   . Heart attack Brother   . Diabetes Sister   . Heart attack Sister   . Diabetes Sister   . Osteoporosis Sister   . Hypertension Sister     SOCIAL HISTORY:  reports that she has never  smoked. She has never used smokeless tobacco. She reports that she does not drink alcohol or use illicit drugs.  REVIEW OF SYSTEMS:  All negative; except for those that are bolded, which indicate positives.  Constitutional: weight loss, weight gain, night sweats, fevers, chills, fatigue, weakness.  HEENT: headaches, sore throat, sneezing, nasal congestion, post nasal drip, difficulty swallowing, tooth/dental problems, visual complaints, visual changes, ear aches. Neuro: difficulty with speech, weakness, numbness, ataxia. CV:  chest pain, orthopnea, PND, swelling in lower extremities, dizziness, palpitations, syncope.  Resp: cough, hemoptysis, dyspnea, wheezing. GI  heartburn, indigestion, abdominal pain, nausea, vomiting, diarrhea, constipation, change in bowel habits, loss of appetite, hematemesis, melena, hematochezia.  GU: dysuria, change in color of urine, urgency or frequency, flank pain, hematuria. MSK: joint pain or swelling, decreased range of motion. Psych: change in mood  or affect, depression, anxiety, suicidal ideations, homicidal ideations. Skin: rash, itching, bruising.   SUBJECTIVE:   VITAL SIGNS: Temp:  [98.2 F (36.8 C)-103.1 F (39.5 C)] 101.6 F (38.7 C) (03/31 1454) Pulse Rate:  [79-90] 80 (03/31 1454) Resp:  [18-25] 23 (03/31 1344) BP: (122-155)/(46-60) 122/56 mmHg (03/31 1454) SpO2:  [95 %-99 %] 96 % (03/31 1454) Weight:  [87.3 kg (192 lb 7.4 oz)] 87.3 kg (192 lb 7.4 oz) (03/31 0534) HEMODYNAMICS:   VENTILATOR SETTINGS:   INTAKE / OUTPUT: Intake/Output      03/30 0701 - 03/31 0700 03/31 0701 - 04/01 0700   P.O. 300 120   Total Intake(mL/kg) 300 (3.4) 120 (1.4)   Net +300 +120        Urine Occurrence 4 x 2 x     PHYSICAL EXAMINATION: General: Chronically ill appearing elderly female, in NAD. Neuro: Somnolent but awakens to voice.  Once awakened, A&O x 3, non-focal.  HEENT: Scalp Level/AT. PERRL, sclerae anicteric. Cardiovascular: RRR, no M/R/G.  Lungs:  Respirations shallow and unlabored.  CTA bilaterally, No W/R/R. Abdomen: BS x 4, soft, NT/ND.  Musculoskeletal: No gross deformities, no edema.  Skin: Intact, warm, no rashes.  LABS:  CBC  Recent Labs Lab 03/06/15 0120 03/06/15 0406 03/07/15 0400  WBC 7.7 12.5* 10.4  HGB 11.7* 11.5* 9.8*  HCT 35.5* 35.2* 30.9*  PLT 132* 128* 109*   Coag's No results for input(s): APTT, INR in the last 168 hours. BMET  Recent Labs Lab 03/06/15 0120 03/06/15 0406 03/07/15 0400  NA 136 134* 134*  K 4.0 4.1 3.8  CL 101 99 101  CO2 27 26 26   BUN 23 24* 23  CREATININE 1.21* 1.22* 1.28*  GLUCOSE 285* 305* 92   Electrolytes  Recent Labs Lab 03/06/15 0120 03/06/15 0406 03/07/15 0400  CALCIUM 9.5 9.2 8.8   Sepsis Markers  Recent Labs Lab 03/07/15 1336  LATICACIDVEN 1.2   ABG No results for input(s): PHART, PCO2ART, PO2ART in the last 168 hours. Liver Enzymes  Recent Labs Lab 03/06/15 0120 03/06/15 0406 03/07/15 0400  AST 21 21 21   ALT 14 14 13   ALKPHOS 54 48 39  BILITOT 0.6 0.5 0.5  ALBUMIN 3.4* 3.2* 2.6*   Cardiac Enzymes  Recent Labs Lab 03/06/15 0120 03/06/15 0406 03/06/15 1555  TROPONINI 0.44* 0.45* 0.35*   Glucose  Recent Labs Lab 03/06/15 2040 03/06/15 2338 03/07/15 0413 03/07/15 0649 03/07/15 0808 03/07/15 1147  GLUCAP 133* 86 91 102* 147* 152*    Imaging Ct Head Wo Contrast  03/06/2015   CLINICAL DATA:  81 year old with confusion, nausea and vomiting today. Recent CVA. History of anemia and stroke. Initial encounter.  EXAM: CT HEAD WITHOUT CONTRAST  TECHNIQUE: Contiguous axial images were obtained from the base of the skull through the vertex without intravenous contrast.  COMPARISON:  Head CT 08/27/2014.  FINDINGS: There is no evidence of acute intracranial hemorrhage, mass lesion, brain edema or extra-axial fluid collection. The ventricles and subarachnoid spaces are mildly prominent but stable. There are stable old infarcts in the right  occipital lobe and inferiorly in the right cerebellum. No acute infarct identified. There is confluent periventricular white matter disease which is similar to the prior study. Extensive intracranial vascular calcifications noted.  Left maxillary sinus mucosal thickening noted. The additional visualized paranasal sinuses, mastoid air cells and middle ears are clear. Calvarial demineralization and scattered lucencies are stable.  IMPRESSION: 1. Stable chronic small vessel ischemic changes and old infarcts in the right occipital lobe  and cerebellum. 2. No acute intracranial findings demonstrated.   Electronically Signed   By: Richardean Sale M.D.   On: 03/06/2015 18:59   Ct Abdomen Pelvis W Contrast  03/06/2015   CLINICAL DATA:  Sudden onset of vomiting 3 hours prior.  Chills.  EXAM: CT ABDOMEN AND PELVIS WITH CONTRAST  TECHNIQUE: Multidetector CT imaging of the abdomen and pelvis was performed using the standard protocol following bolus administration of intravenous contrast.  CONTRAST:  33mL OMNIPAQUE IOHEXOL 300 MG/ML SOLN, 56mL OMNIPAQUE IOHEXOL 300 MG/ML SOLN  COMPARISON:  08/27/2014  FINDINGS: The included lung bases are clear. There are old right-sided rib fractures. The heart is mildly enlarged. Mitral annulus and coronary artery calcifications are seen.  Clips in the gallbladder fossa from cholecystectomy. Focal calcification the falciform ligament is unchanged. There is no focal hepatic lesion. No biliary dilatation. Spleen, adrenal glands, and pancreas are normal.  There is perinephric stranding about both kidneys, heterogeneous enhancement, right greater than left. No hydronephrosis, there is symmetric renal excretion. Small bilateral renal hypodensities bilaterally, too small to characterize, suspect cysts.  Stomach is minimally distended. There are no dilated or thickened bowel loops. The appendix not visualized, no pericecal inflammatory change. Equivocal colonic wall thickening involving the  transverse colon versus nondistention, nondistention is favored given lack of surrounding inflammatory change. Small volume of stool throughout the ascending and descending colon. There distal colonic diverticular without diverticulitis. Scattered mesenteric calcifications may reflect granulomas in sequela of prior granulomatous disease versus phleboliths.  Bladder is physiologically distended. The uterus is surgically absent. The ovaries are not seen, no adnexal mass.  Abdominal aorta is normal in caliber moderate atherosclerosis. No retroperitoneal adenopathy. No free air, free fluid, or intra-abdominal fluid collection.  There are no acute or suspicious osseous abnormalities. Compression deformity of L3 is again seen, not significantly changed. There is multilevel degenerative change throughout spine.  IMPRESSION: 1. Perinephric stranding about both kidneys with question of heterogeneous enhancement on the right. Correlation with urinalysis recommended to exclude urinary tract infection. 2. Equivocal colonic wall thickening of the transverse colon versus nondistention, nondistention is favored given lack of surrounding inflammatory change.   Electronically Signed   By: Jeb Levering M.D.   On: 03/06/2015 02:57   Dg Chest Port 1 View  03/06/2015   CLINICAL DATA:  Fever.  EXAM: PORTABLE CHEST - 1 VIEW  COMPARISON:  Chest radiograph and chest CT, 08/27/2014.  FINDINGS: Changes from CABG surgery are stable. Cardiac silhouette is normal in size. No mediastinal or hilar masses. No lung consolidation or edema. No pleural effusion or pneumothorax.  Bony thorax is demineralized. Old healed left proximal humeral fracture is stable.  IMPRESSION: No acute cardiopulmonary disease.   Electronically Signed   By: Lajean Manes M.D.   On: 03/06/2015 13:11    ASSESSMENT / PLAN:  PULMONARY A: Acute hypoxic respiratory failure - ABG reassuring (7.36 / 47 / 85) Interstitial edema Atelectasis P:   Continue  supplemental O2 as needed to maintain SpO2 > 92%. Lasix restricted due to sepsis. Pulmonary hygiene. CXR in AM.  CARDIOVASCULAR A:  Bacteremia - BCx's pos for GNR's.  Lactate reassuring. Troponin leak - downtrending (0.44 -> 0.45 -> 0.35) Hx HTN, CAD, CHF (last EF 75% in 2011) P:  Monitor hemodynamics. Maintain MAP > 65. Repeat troponin. Lopressor PRN for SBP > 160. Hold outpatient amlodipine, atorvastatin, lasix, lisinopril, metoprolol, nitro.  RENAL A:   Chronic renal insufficiency Hyponatremia P:   NS @ 75. BMP in AM.  GASTROINTESTINAL A:   GERD Nutrition P:   Pantoprazole. Dysphagia 3 diet.  HEMATOLOGIC A:   Anemia Thrombocytopenia VTE Prophylaxis P:  Transfuse for Hgb < 7. Monitor platelets. SCD's only (Heparin allergy). CBC in AM.  INFECTIOUS A:   Bacteremia - 2/2 BCx's positive for GNR's UTI P:   BCx2 3/30 > GNR's > UCx 3/30 > Abx: Vanc, start date 3/31, day 1/x. Abx: Zosyn, start date 3/31, day 1/x.  ENDOCRINE A:   DM   P:   CBG's q4hr. SSI. Hold outpatient metformin, invokana, insulin.  NEUROLOGIC A:   Acute metabolic encephalopathy Hx ? Depression / anxiety, CVA's P:   Hold outpatient lorazepam, paroxetine.   Family updated: 2 daughters at bedside.  Interdisciplinary Family Meeting v Palliative Care Meeting:  Due by: 03/13/15.   Montey Hora, [place] [facility] Pager: [phone]  or [phone]  Patient is easily arousable but clearly lethargic and falls back asleep.  ABG is normal.  Mental status is likely due to sepsis.  BP responded nicely to fluid.  She is growing gram negative rods in 2/2 bottles.  I believe she is safe to transfer to the SDU.  BP currently [id number] systolic.  ABG is ok and patient is clearly protecting her airway.  If deteriorates further then will transfer to the ICU.  The patient is critically ill with multiple organ systems failure and requires high complexity  decision making for assessment and support, frequent evaluation and titration of therapies, application of advanced monitoring technologies and extensive interpretation of multiple databases.   Critical Care Time devoted to patient care services described in this note is  35  Minutes. This time reflects time of care of this signee Dr Jennet Maduro. This critical care time does not reflect procedure time, or teaching time or supervisory time of PA/NP/Med student/Med Resident etc but could involve care discussion time.  Rush Farmer, M.D. [facility] Pulmonary/Critical Care Medicine. Pager: [phone]. After hours pager: [phone].  03/07/2015, 3:29 PM

## 2015-03-07 NOTE — Progress Notes (Addendum)
Physician notified: Fredirick Maudlin At: [facility]  Regarding:  Critical trop at 0.63    TMAX 101.3, no PRN. ?aleve?  Awaiting return response.   CRITICAL VALUE ALERT  Critical value received:  Trop 0.63  Date of notification:  03/07/15  Time of notification: 1907   Critical value read back:Yes.    Nurse who received alert:  Pricilla Holm, RN  MD notified (1st page):  Fredirick Maudlin  Time of first page:  1930  MD notified (2nd page):  Time of second page:  Responding MD:    Time MD responded:    [place] with Fredirick Maudlin in regards to Trop 0.63 and Temp 101.2, will order Advil as patient cannot have Tylenol.

## 2015-03-07 NOTE — Progress Notes (Signed)
PT Cancellation Note  Patient Details Name: [name] MRN: [id number] DOB: [birth year]   Cancelled Treatment:    Reason Eval/Treat Not Completed: Patient not medically ready;Other (comment) (pt. has orders to transfer to stepdown)   Ladona Ridgel 03/07/2015, 11:50 AM

## 2015-03-07 NOTE — Progress Notes (Signed)
Lab called with critical lab result: Areorobic bottle collected on 03/05/14 positive for gram - rods. Dr. Erlinda Hong notified. Thanks Vicente Males RN 03/07/15

## 2015-03-07 NOTE — Progress Notes (Addendum)
PROGRESS NOTE  [name] [id number] DOB: 1931-08-10 DOA: 03/06/2015 PCP: Sharion Balloon, FNP  HPI/Recap of past 24 hours: Daughter reported patient was awake and had breakfast, however, after breakfast patient Spiked fever, more confused, daughter did noticed patient seems to e chocked during breakfast Daughter reported patient is allergic to advil with tongue swelling, but takes aleve all the time with no problem.  Assessment/Plan: Active Problems:   Elevated troponin   Fever   History of diabetes mellitus   Left buttock pain  1- Elevated trop , likely demand ischemia, EKg inferolateral St depression, possible chronic  -No active CP, though baseline CAD -noted myoview 07/2013 w/ no evidence of ischemia/infarct -full dose ASA -nitropaste -cycle CEs. Echo pending -risk stratification labs -tele bed -f/u cards recs   2-fever /leukocytosis: uti? Aspiration? Also reported left buttock pain, though Ct ab/pelvic no correlated findings to left buttock -noted suprapubic pain with CT imaging concerning for perinephric stranding -start IV rocephin empirically, broaden abx to vanc/zosyn -urine culture, blood culture, cxr -swallow eval/start soft diet for now/aspiration precaution -3/31 am spike fever, more confused, keep npo, broaden abx, transfer to step down, plan of care explained to daughter who agrees.  3-CAD s/p CABG, last stress test 2014, no acute findings -no active CP in setting of above -full dose ASA -cont home regimen  -tele bed    4-Chronic diastolic heart failure -2D ECHO pending -euvolemic to dry on exam  -strict Is and Os, daily weights , on gentle hydration currently  5-IDDM -SSI  -NPH  -A1C -hold orals  6. H/o cva/carotid stenosis, at baseline with right sided facial droop/slurred speech/partical visual field loss.  PT/speech  FEN/GI: heart healthy-carb modified diet/soft diet  Prophylaxis: sub q heparin  Disposition: pending  further evaluation  Code Status:discussed with family, reported code status is partial,  No intubation.   Family Communication: patient and two daughters   Consultants:  Cardiology  PCCM on 3/31  Procedures:  none  Antibiotics:  Rocephin from admission to 3/31  Vanc/zosyn from 3/31   Objective: BP 127/46 mmHg  Pulse 79  Temp(Src) 100 F (37.8 C) (Oral)  Resp 18  Ht 5\' 6"  (1.676 m)  Wt 87.3 kg (192 lb 7.4 oz)  BMI 31.08 kg/m2  SpO2 99%  Intake/Output Summary (Last 24 hours) at 03/07/15 1018 Last data filed at 03/06/15 2326  Gross per 24 hour  Intake    300 ml  Output      0 ml  Net    300 ml   Filed Weights   03/06/15 0051 03/06/15 0739 03/07/15 0534  Weight: 86.183 kg (190 lb) 85.9 kg (189 lb 6 oz) 87.3 kg (192 lb 7.4 oz)    Exam:   General:  NAD, frail, confused, somnolent  Cardiovascular: RRR  Respiratory: CTABL  Abdomen: Soft/ND/NT, positive BS  Musculoskeletal: No Edema  Neuro: chronic deficit from prior cva 40yrs ago, no new findings  Skin, left buttock unremarkable, no induration, no erythema, no significant tender.  Data Reviewed: Basic Metabolic Panel:  Recent Labs Lab 03/06/15 0120 03/06/15 0406 03/07/15 0400  NA 136 134* 134*  K 4.0 4.1 3.8  CL 101 99 101  CO2 27 26 26   GLUCOSE 285* 305* 92  BUN 23 24* 23  CREATININE 1.21* 1.22* 1.28*  CALCIUM 9.5 9.2 8.8   Liver Function Tests:  Recent Labs Lab 03/06/15 0120 03/06/15 0406 03/07/15 0400  AST 21 21 21   ALT 14 14 13   ALKPHOS 54  48 39  BILITOT 0.6 0.5 0.5  PROT 7.0 6.6 5.8*  ALBUMIN 3.4* 3.2* 2.6*    Recent Labs Lab 03/06/15 0120  LIPASE 23   No results for input(s): AMMONIA in the last 168 hours. CBC:  Recent Labs Lab 03/06/15 0120 03/06/15 0406 03/07/15 0400  WBC 7.7 12.5* 10.4  NEUTROABS 7.4 11.9* 8.6*  HGB 11.7* 11.5* 9.8*  HCT 35.5* 35.2* 30.9*  MCV 87.7 88.0 88.3  PLT 132* 128* 109*   Cardiac Enzymes:    Recent Labs Lab 03/06/15 0120  03/06/15 0406 03/06/15 1555  TROPONINI 0.44* 0.45* 0.35*   BNP (last 3 results) No results for input(s): BNP in the last 8760 hours.  ProBNP (last 3 results) No results for input(s): PROBNP in the last 8760 hours.  CBG:  Recent Labs Lab 03/06/15 2040 03/06/15 2338 03/07/15 0413 03/07/15 0649 03/07/15 0808  GLUCAP 133* 86 91 102* 147*    No results found for this or any previous visit (from the past 240 hour(s)).   Studies: Ct Head Wo Contrast  03/06/2015   CLINICAL DATA:  81 year old with confusion, nausea and vomiting today. Recent CVA. History of anemia and stroke. Initial encounter.  EXAM: CT HEAD WITHOUT CONTRAST  TECHNIQUE: Contiguous axial images were obtained from the base of the skull through the vertex without intravenous contrast.  COMPARISON:  Head CT 08/27/2014.  FINDINGS: There is no evidence of acute intracranial hemorrhage, mass lesion, brain edema or extra-axial fluid collection. The ventricles and subarachnoid spaces are mildly prominent but stable. There are stable old infarcts in the right occipital lobe and inferiorly in the right cerebellum. No acute infarct identified. There is confluent periventricular white matter disease which is similar to the prior study. Extensive intracranial vascular calcifications noted.  Left maxillary sinus mucosal thickening noted. The additional visualized paranasal sinuses, mastoid air cells and middle ears are clear. Calvarial demineralization and scattered lucencies are stable.  IMPRESSION: 1. Stable chronic small vessel ischemic changes and old infarcts in the right occipital lobe and cerebellum. 2. No acute intracranial findings demonstrated.   Electronically Signed   By: Richardean Sale M.D.   On: 03/06/2015 18:59   Ct Abdomen Pelvis W Contrast  03/06/2015   CLINICAL DATA:  Sudden onset of vomiting 3 hours prior.  Chills.  EXAM: CT ABDOMEN AND PELVIS WITH CONTRAST  TECHNIQUE: Multidetector CT imaging of the abdomen and pelvis was  performed using the standard protocol following bolus administration of intravenous contrast.  CONTRAST:  27mL OMNIPAQUE IOHEXOL 300 MG/ML SOLN, 69mL OMNIPAQUE IOHEXOL 300 MG/ML SOLN  COMPARISON:  08/27/2014  FINDINGS: The included lung bases are clear. There are old right-sided rib fractures. The heart is mildly enlarged. Mitral annulus and coronary artery calcifications are seen.  Clips in the gallbladder fossa from cholecystectomy. Focal calcification the falciform ligament is unchanged. There is no focal hepatic lesion. No biliary dilatation. Spleen, adrenal glands, and pancreas are normal.  There is perinephric stranding about both kidneys, heterogeneous enhancement, right greater than left. No hydronephrosis, there is symmetric renal excretion. Small bilateral renal hypodensities bilaterally, too small to characterize, suspect cysts.  Stomach is minimally distended. There are no dilated or thickened bowel loops. The appendix not visualized, no pericecal inflammatory change. Equivocal colonic wall thickening involving the transverse colon versus nondistention, nondistention is favored given lack of surrounding inflammatory change. Small volume of stool throughout the ascending and descending colon. There distal colonic diverticular without diverticulitis. Scattered mesenteric calcifications may reflect granulomas in sequela of prior granulomatous  disease versus phleboliths.  Bladder is physiologically distended. The uterus is surgically absent. The ovaries are not seen, no adnexal mass.  Abdominal aorta is normal in caliber moderate atherosclerosis. No retroperitoneal adenopathy. No free air, free fluid, or intra-abdominal fluid collection.  There are no acute or suspicious osseous abnormalities. Compression deformity of L3 is again seen, not significantly changed. There is multilevel degenerative change throughout spine.  IMPRESSION: 1. Perinephric stranding about both kidneys with question of heterogeneous  enhancement on the right. Correlation with urinalysis recommended to exclude urinary tract infection. 2. Equivocal colonic wall thickening of the transverse colon versus nondistention, nondistention is favored given lack of surrounding inflammatory change.   Electronically Signed   By: Jeb Levering M.D.   On: 03/06/2015 02:57   Dg Chest Port 1 View  03/06/2015   CLINICAL DATA:  Fever.  EXAM: PORTABLE CHEST - 1 VIEW  COMPARISON:  Chest radiograph and chest CT, 08/27/2014.  FINDINGS: Changes from CABG surgery are stable. Cardiac silhouette is normal in size. No mediastinal or hilar masses. No lung consolidation or edema. No pleural effusion or pneumothorax.  Bony thorax is demineralized. Old healed left proximal humeral fracture is stable.  IMPRESSION: No acute cardiopulmonary disease.   Electronically Signed   By: Lajean Manes M.D.   On: 03/06/2015 13:11    Scheduled Meds: . acidophilus  1 capsule Oral Daily  . aspirin EC  325 mg Oral Daily  . clopidogrel  75 mg Oral Daily  . insulin aspart  0-9 Units Subcutaneous 6 times per day  . insulin NPH Human  20 Units Subcutaneous QAC supper  . insulin NPH Human  60 Units Subcutaneous QAC breakfast  . lisinopril  20 mg Oral Daily  . LORazepam  0.5 mg Oral BID  . metoprolol  50 mg Oral BID  . nitroGLYCERIN  1 inch Topical 4 times per day  . pantoprazole  80 mg Oral Q1200  . PARoxetine  20 mg Oral Daily  . piperacillin-tazobactam (ZOSYN)  IV  3.375 g Intravenous Q8H  . sodium chloride  3 mL Intravenous Q12H  . [START ON 03/08/2015] vancomycin  1,250 mg Intravenous Q24H  . vancomycin  1,750 mg Intravenous Once    Continuous Infusions: . sodium chloride 75 mL/hr at 03/06/15 2327     Time spent: 69mins   Francheska Villeda MD, PhD  [facility] Pager [phone]. If 7PM-7AM, please contact night-coverage at [email or web address], password [facility] 03/07/2015, 10:18 AM  LOS: 1 day     picc line placed due to poor iv access.

## 2015-03-07 NOTE — Progress Notes (Signed)
ANTIBIOTIC CONSULT NOTE - INITIAL  Pharmacy Consult for vancomycin and zosyn Indication: rule out sepsis  Allergies  Allergen Reactions  . Heparin Other (See Comments)    Confusion  . Propoxyphene N-Acetaminophen Other (See Comments)    Numbness all over. "floating" sensation.    Patient Measurements: Height: 5\' 6"  (167.6 cm) Weight: 192 lb 7.4 oz (87.3 kg) IBW/kg (Calculated) : 59.3   Vital Signs: Temp: 100 F (37.8 C) (03/31 0534) Temp Source: Oral (03/31 0534) BP: 127/46 mmHg (03/31 0534) Pulse Rate: 79 (03/31 0534) Intake/Output from previous day: 03/30 0701 - 03/31 0700 In: 300 [P.O.:300] Out: -  Intake/Output from this shift:    Labs:  Recent Labs  03/06/15 0120 03/06/15 0406 03/07/15 0400  WBC 7.7 12.5* 10.4  HGB 11.7* 11.5* 9.8*  PLT 132* 128* 109*  CREATININE 1.21* 1.22* 1.28*   Estimated Creatinine Clearance: 38.4 mL/min (by C-G formula based on Cr of 1.28). No results for input(s): VANCOTROUGH, VANCOPEAK, VANCORANDOM, GENTTROUGH, GENTPEAK, GENTRANDOM, TOBRATROUGH, TOBRAPEAK, TOBRARND, AMIKACINPEAK, AMIKACINTROU, AMIKACIN in the last 72 hours.   Microbiology: No results found for this or any previous visit (from the past 720 hour(s)).  Medical History: Past Medical History  Diagnosis Date  . Anemia   . Diabetes mellitus   . Hypertension   . Iron deficiency anemia 05/27/2011  . CHF (congestive heart failure)     EF preserved Echo 2012  . CVA (cerebral infarction)   . Carotid stenosis   . Renal insufficiency   . Furuncle of back, except buttock   . Vertigo   . CAD (coronary artery disease)   . Arthritis     Medications:  Prescriptions prior to admission  Medication Sig Dispense Refill Last Dose  . amLODipine (NORVASC) 10 MG tablet Take 1 tablet (10 mg total) by mouth daily. 30 tablet 11 03/05/2015 at Unknown time  . Artificial Tear Ointment (DRY EYES OP) Apply 1 drop to eye daily as needed. For dry eye/ red eye    unknown  . aspirin 81 MG  EC tablet Take 81 mg by mouth at bedtime.    03/05/2015 at Unknown time  . atorvastatin (LIPITOR) 20 MG tablet Take 1 tablet (20 mg total) by mouth at bedtime. 30 tablet 11 03/05/2015 at Unknown time  . canagliflozin (INVOKANA) 100 MG TABS tablet Take 1 tablet (100 mg total) by mouth daily. 90 tablet 3 03/05/2015 at Unknown time  . clopidogrel (PLAVIX) 75 MG tablet Take 1 tablet (75 mg total) by mouth daily. 30 tablet 11 03/05/2015 at Unknown time  . EASY TOUCH INSULIN SYRINGE 31G X 5/16" 1 ML MISC USE AS DIRECTED. 80 each 2 unknown  . esomeprazole (NEXIUM) 40 MG capsule Take 1 capsule (40 mg total) by mouth daily as needed (FO ACID REFLUX/GERD). 30 capsule 11 03/04/2015  . furosemide (LASIX) 20 MG tablet Take 1 tablet (20 mg total) by mouth daily as needed for fluid or edema. 30 tablet 5 02/28/2015  . insulin NPH Human (HUMULIN N) 100 UNIT/ML injection Inject 65 units each morning with breakfast and 28 units each evening with supper. 40 mL 11 03/05/2015 at Unknown time  . lisinopril (PRINIVIL,ZESTRIL) 20 MG tablet Take 1 tablet (20 mg total) by mouth daily. 90 tablet 3 03/05/2015 at Unknown time  . LORazepam (ATIVAN) 0.5 MG tablet TAKE (1) TABLET TWICE DAILY. 60 tablet 1 03/05/2015 at Unknown time  . metFORMIN (GLUCOPHAGE XR) 500 MG 24 hr tablet Take 1 tablet with breakfast and 1 tablet with supper  60 tablet 11 03/05/2015 at Unknown time  . metoprolol (LOPRESSOR) 50 MG tablet Take 1 tablet (50 mg total) by mouth 2 (two) times daily. 60 tablet 11 03/05/2015 at 2000  . Multiple Vitamins-Iron (DAILY VITAMINS/IRON/BETA CAROT PO) Take 1 tablet by mouth at bedtime.   03/05/2015 at Unknown time  . NITROSTAT 0.4 MG SL tablet PLACE ONE (1) TABLET UNDER TONGUE EVERY 5 MINUTES UP TO (3) DOSES AS NEEDED FOR CHEST PAIN. 25 tablet 2 unknown  . PARoxetine (PAXIL) 20 MG tablet Take 1 tablet (20 mg total) by mouth daily. 30 tablet 11 03/05/2015 at Unknown time   Assessment: 81 yo lady to start broad spectrum antibiotics.  T max  100, WBC 10.4, UA dirty. Her CrCl ~38 ml/min.  Goal of Therapy:  Vancomycin trough level 15-20 mcg/ml  Plan:  Vancomycin 1750 mg IV X 1 then 1250 mg IV q24 hours Zosyn 3.375 gm IV q8 hours IE Will f/u renal function, cultures and clinical course  Thanks for allowing pharmacy to be a part of this patient's care.  Excell Seltzer, PharmD Clinical Pharmacist, [phone] 03/07/2015,9:32 AM

## 2015-03-08 ENCOUNTER — Inpatient Hospital Stay (HOSPITAL_COMMUNITY): Payer: Medicare Other

## 2015-03-08 DIAGNOSIS — A4150 Gram-negative sepsis, unspecified: Secondary | ICD-10-CM

## 2015-03-08 LAB — GLUCOSE, CAPILLARY
GLUCOSE-CAPILLARY: 111 mg/dL — AB (ref 70–99)
GLUCOSE-CAPILLARY: 271 mg/dL — AB (ref 70–99)
Glucose-Capillary: 102 mg/dL — ABNORMAL HIGH (ref 70–99)
Glucose-Capillary: 187 mg/dL — ABNORMAL HIGH (ref 70–99)
Glucose-Capillary: 328 mg/dL — ABNORMAL HIGH (ref 70–99)

## 2015-03-08 LAB — COMPREHENSIVE METABOLIC PANEL
ALT: 13 U/L (ref 0–35)
AST: 21 U/L (ref 0–37)
Albumin: 2.2 g/dL — ABNORMAL LOW (ref 3.5–5.2)
Alkaline Phosphatase: 39 U/L (ref 39–117)
Anion gap: 4 — ABNORMAL LOW (ref 5–15)
BUN: 27 mg/dL — ABNORMAL HIGH (ref 6–23)
CALCIUM: 8.8 mg/dL (ref 8.4–10.5)
CO2: 29 mmol/L (ref 19–32)
Chloride: 106 mmol/L (ref 96–112)
Creatinine, Ser: 1.38 mg/dL — ABNORMAL HIGH (ref 0.50–1.10)
GFR calc non Af Amer: 35 mL/min — ABNORMAL LOW (ref >90)
GFR, EST AFRICAN AMERICAN: 40 mL/min — AB (ref >90)
Glucose, Bld: 108 mg/dL — ABNORMAL HIGH (ref 70–99)
POTASSIUM: 3.4 mmol/L — AB (ref 3.5–5.1)
Sodium: 139 mmol/L (ref 135–145)
TOTAL PROTEIN: 5.5 g/dL — AB (ref 6.0–8.3)
Total Bilirubin: 0.6 mg/dL (ref 0.3–1.2)

## 2015-03-08 LAB — MAGNESIUM: Magnesium: 2 mg/dL (ref 1.5–2.5)

## 2015-03-08 LAB — URINE CULTURE: Colony Count: >=100000

## 2015-03-08 LAB — CBC WITH DIFFERENTIAL/PLATELET
BASOS ABS: 0 10*3/uL (ref 0.0–0.1)
Basophils Relative: 0 % (ref 0–1)
EOS ABS: 0 10*3/uL (ref 0.0–0.7)
Eosinophils Relative: 0 % (ref 0–5)
HEMATOCRIT: 29.2 % — AB (ref 36.0–46.0)
Hemoglobin: 9.5 g/dL — ABNORMAL LOW (ref 12.0–15.0)
LYMPHS ABS: 0.6 10*3/uL — AB (ref 0.7–4.0)
LYMPHS PCT: 7 % — AB (ref 12–46)
MCH: 28.4 pg (ref 26.0–34.0)
MCHC: 32.5 g/dL (ref 30.0–36.0)
MCV: 87.4 fL (ref 78.0–100.0)
Monocytes Absolute: 0.2 10*3/uL (ref 0.1–1.0)
Monocytes Relative: 2 % — ABNORMAL LOW (ref 3–12)
Neutro Abs: 7 10*3/uL (ref 1.7–7.7)
Neutrophils Relative %: 90 % — ABNORMAL HIGH (ref 43–77)
PLATELETS: 75 10*3/uL — AB (ref 150–400)
RBC: 3.34 MIL/uL — ABNORMAL LOW (ref 3.87–5.11)
RDW: 14.8 % (ref 11.5–15.5)
WBC: 7.8 10*3/uL (ref 4.0–10.5)

## 2015-03-08 NOTE — Progress Notes (Signed)
PROGRESS NOTE  [name] [id number] DOB: 02/22/1931 DOA: 03/06/2015 PCP: Sharion Balloon, FNP  HPI/Recap of past 24 hours: Feeling better, no fever, no sob, daughter at bedside.  Assessment/Plan: Active Problems:   Elevated troponin   Fever   History of diabetes mellitus   Left buttock pain  1- Elevated trop , likely demand ischemia, EKg inferolateral St depression, possible chronic  -No active CP, though baseline CAD -noted myoview 07/2013 w/ no evidence of ischemia/infarct -full dose ASA -nitropaste -cycle CEs. Echo with normal LVEF, with grade II diastolic dysfuntion -risk stratification labs -tele bed   2-fever /leukocytosis: uti? Aspiration? Also reported left buttock pain, though Ct ab/pelvic no correlated findings to left buttock -noted suprapubic pain with CT imaging concerning for perinephric stranding -start IV rocephin empirically, broaden abx to vanc/zosynon 3/31 due to spiking fever and confusion, pccm consulted on 3/31 and patient transferred to stepdown unit -urine culture + ecoli, blood culture 2/2 g-rods, repeat cxrimproving on 4/1, d/c vanc, continue zosyn on 4/1. -swallow eval/start soft diet for now/aspiration precaution   3-CAD s/p CABG, last stress test 2014, no acute findings -no active CP in setting of above -full dose ASA -cont home regimen  -tele bed    4-Chronic diastolic heart failure -2D ECHO pending -euvolemic to dry on exam  -strict Is and Os, daily weights , on gentle hydration currently  5-IDDM -SSI  -NPH  -A1C -hold orals  6. H/o cva/carotid stenosis, at baseline with right sided facial droop/slurred speech/partical visual field loss.  PT/speech  FEN/GI: heart healthy-carb modified diet/soft diet  Prophylaxis: sub q heparin  Disposition: likely home with home health Code Status:discussed with family, initially states partial code, then changed to full code.   Family Communication: patient and  daughter   Consultants:  PCCM on 3/31  Procedures:  PICC line 3/31  Antibiotics:  Rocephin from admission to 3/31  Vanc 3/31 and 4/1  zosyn from 3/31   Objective: BP 123/49 mmHg  Pulse 65  Temp(Src) 97.8 F (36.6 C) (Axillary)  Resp 18  Ht 5\' 6"  (1.676 m)  Wt 85.73 kg (189 lb)  BMI 30.52 kg/m2  SpO2 100%  Intake/Output Summary (Last 24 hours) at 03/08/15 1909 Last data filed at 03/08/15 1101  Gross per 24 hour  Intake     13 ml  Output    985 ml  Net   -972 ml   Filed Weights   03/06/15 0739 03/07/15 0534 03/08/15 0400  Weight: 85.9 kg (189 lb 6 oz) 87.3 kg (192 lb 7.4 oz) 85.73 kg (189 lb)    Exam:   General:  NAD, frail, alert  Cardiovascular: RRR  Respiratory: CTABL  Abdomen: Soft/ND/NT, positive BS  Musculoskeletal: No Edema  Neuro: chronic deficit ( aphasia) from prior cva 4yrs ago, no new findings  Skin, left buttock unremarkable, no induration, no erythema, no significant tender.  Data Reviewed: Basic Metabolic Panel:  Recent Labs Lab 03/06/15 0120 03/06/15 0406 03/07/15 0400 03/08/15 0505  NA 136 134* 134* 139  K 4.0 4.1 3.8 3.4*  CL 101 99 101 106  CO2 27 26 26 29   GLUCOSE 285* 305* 92 108*  BUN 23 24* 23 27*  CREATININE 1.21* 1.22* 1.28* 1.38*  CALCIUM 9.5 9.2 8.8 8.8  MG  --   --   --  2.0   Liver Function Tests:  Recent Labs Lab 03/06/15 0120 03/06/15 0406 03/07/15 0400 03/08/15 0505  AST 21 21 21 21   ALT 14  14 13 13   ALKPHOS 54 48 39 39  BILITOT 0.6 0.5 0.5 0.6  PROT 7.0 6.6 5.8* 5.5*  ALBUMIN 3.4* 3.2* 2.6* 2.2*    Recent Labs Lab 03/06/15 0120  LIPASE 23   No results for input(s): AMMONIA in the last 168 hours. CBC:  Recent Labs Lab 03/06/15 0120 03/06/15 0406 03/07/15 0400 03/08/15 0505  WBC 7.7 12.5* 10.4 7.8  NEUTROABS 7.4 11.9* 8.6* 7.0  HGB 11.7* 11.5* 9.8* 9.5*  HCT 35.5* 35.2* 30.9* 29.2*  MCV 87.7 88.0 88.3 87.4  PLT 132* 128* 109* 75*   Cardiac Enzymes:    Recent Labs Lab  03/06/15 0120 03/06/15 0406 03/06/15 1555 03/07/15 1735  TROPONINI 0.44* 0.45* 0.35* 0.63*   BNP (last 3 results) No results for input(s): BNP in the last 8760 hours.  ProBNP (last 3 results) No results for input(s): PROBNP in the last 8760 hours.  CBG:  Recent Labs Lab 03/07/15 2351 03/08/15 0309 03/08/15 0743 03/08/15 1138 03/08/15 1643  GLUCAP 116* 111* 102* 187* 328*    Recent Results (from the past 240 hour(s))  Culture, blood (routine x 2)     Status: None (Preliminary result)   Collection Time: 03/06/15  1:45 PM  Result Value Ref Range Status   Specimen Description BLOOD LEFT ARM  Final   Special Requests BOTTLES DRAWN AEROBIC ONLY [place]  Final   Culture   Final    ESCHERICHIA COLI Note: Gram Stain Report Called to,Read Back By and Verified With: DEREK D 03/07/15 1355 BY SMITHERSJ Performed at [facility]    Report Status PENDING  Incomplete  Culture, blood (routine x 2)     Status: None (Preliminary result)   Collection Time: 03/06/15  1:51 PM  Result Value Ref Range Status   Specimen Description BLOOD LEFT HAND  Final   Special Requests BOTTLES DRAWN AEROBIC ONLY 3.5CC  Final   Culture   Final    ESCHERICHIA COLI Note: Gram Stain Report Called to,Read Back By and Verified With: DERRICK RN ON 2W AT 1030 [id number] BY CASTC Performed at [facility]    Report Status PENDING  Incomplete  Culture, Urine     Status: None   Collection Time: 03/06/15  4:55 PM  Result Value Ref Range Status   Specimen Description URINE, CATHETERIZED  Final   Special Requests NONE  Final   Colony Count   Final    >=100,000 COLONIES/ML Performed at [facility]    Culture   Final    ESCHERICHIA COLI Performed at [facility]    Report Status 03/08/2015 FINAL  Final   Organism ID, Bacteria ESCHERICHIA COLI  Final      Susceptibility   Escherichia coli - MIC*    AMPICILLIN >=32 RESISTANT Resistant     CEFAZOLIN <=4 SENSITIVE Sensitive      CEFTRIAXONE <=1 SENSITIVE Sensitive     CIPROFLOXACIN <=0.25 SENSITIVE Sensitive     GENTAMICIN <=1 SENSITIVE Sensitive     LEVOFLOXACIN <=0.12 SENSITIVE Sensitive     NITROFURANTOIN <=16 SENSITIVE Sensitive     TOBRAMYCIN <=1 SENSITIVE Sensitive     TRIMETH/SULFA <=20 SENSITIVE Sensitive     PIP/TAZO <=4 SENSITIVE Sensitive     * ESCHERICHIA COLI  Culture, blood (routine x 2)     Status: None (Preliminary result)   Collection Time: 03/07/15 11:30 AM  Result Value Ref Range Status   Specimen Description BLOOD LEFT ARM  Final   Special Requests BOTTLES DRAWN  AEROBIC ONLY 5CC  Final   Culture   Final           BLOOD CULTURE RECEIVED NO GROWTH TO DATE CULTURE WILL BE HELD FOR 5 DAYS BEFORE ISSUING A FINAL NEGATIVE REPORT Performed at [facility]    Report Status PENDING  Incomplete  MRSA PCR Screening     Status: None   Collection Time: 03/07/15  4:50 PM  Result Value Ref Range Status   MRSA by PCR NEGATIVE NEGATIVE Final    Comment:        The GeneXpert MRSA Assay (FDA approved for NASAL specimens only), is one component of a comprehensive MRSA colonization surveillance program. It is not intended to diagnose MRSA infection nor to guide or monitor treatment for MRSA infections.      Studies: Dg Chest Port 1 View  03/07/2015   CLINICAL DATA:  Subsequent evaluation fever shortness of breath aspiration  EXAM: PORTABLE CHEST - 1 VIEW  COMPARISON:  03/06/2015  FINDINGS: Mild to moderate cardiac enlargement stable. Vascular congestion. Mild to moderate interstitial prominence increased in conspicuity. Mild more focal infiltrate left lower lobe new from prior study as well. Mild blunting of the left costophrenic angle.  IMPRESSION: Congestive heart failure with interstitial pulmonary edema.  Mild asymmetric infiltrate left lower lobe could potentially represent developing pneumonitis. Radiographic followup recommended.   Electronically Signed   By: Skipper Cliche M.D.   On:  03/07/2015 11:14    Scheduled Meds: . acidophilus  1 capsule Oral Daily  . aspirin EC  325 mg Oral Daily  . clopidogrel  75 mg Oral Daily  . insulin aspart  0-9 Units Subcutaneous 6 times per day  . pantoprazole  80 mg Oral Q1200  . piperacillin-tazobactam (ZOSYN)  IV  3.375 g Intravenous Q8H  . sodium chloride  10-40 mL Intracatheter Q12H  . sodium chloride  3 mL Intravenous Q12H    Continuous Infusions: . sodium chloride 75 mL/hr at 03/06/15 2327  . sodium chloride 50 mL/hr at 03/07/15 2044     Time spent: 31mins   Kahmari Koller MD, PhD  [facility] Pager [phone]. If 7PM-7AM, please contact night-coverage at [email or web address], password [facility] 03/08/2015, 7:09 PM  LOS: 2 days

## 2015-03-08 NOTE — Evaluation (Signed)
Physical Therapy Evaluation Patient Details Name: [name] MRN: [id number] DOB: 1931/09/15 Today's Date: 03/08/2015   History of Present Illness    81 y.o. F brought to [facility] 3/30 with UTI and troponin bump. Spiked fever 3/31, PCCM consulted. BCx's positive for GNR's.    Clinical Impression  Pt admitted with above diagnosis. Pt currently with functional limitations due to the deficits listed below (see PT Problem List). Pts daughter states she can assist pt at home.  Does not want HHPT.  Has equipment.  Will follow acutely.  Pt will benefit from skilled PT to increase their independence and safety with mobility to allow discharge to the venue listed below.      Follow Up Recommendations No PT follow up (daughter refuses saying she can do pts therapy)    Equipment Recommendations  None recommended by PT    Recommendations for Other Services       Precautions / Restrictions Precautions Precautions: Fall Restrictions Weight Bearing Restrictions: No      Mobility  Bed Mobility Overal bed mobility: Needs Assistance Bed Mobility: Supine to Sit     Supine to sit: Min assist;Mod assist     General bed mobility comments: incr time needed and assist for elevation of trunk.   Transfers Overall transfer level: Needs assistance Equipment used: Rolling walker (2 wheeled) Transfers: Sit to/from Stand Sit to Stand: Mod assist         General transfer comment: Pt needed cues for hand placement and assist to power up.   Ambulation/Gait Ambulation/Gait assistance: Min assist;Min guard Ambulation Distance (Feet): 18 Feet Assistive device: Rolling walker (2 wheeled) Gait Pattern/deviations: Decreased stride length;Antalgic;Trunk flexed;Wide base of support;Shuffle;Step-to pattern   Gait velocity interpretation: Below normal speed for age/gender General Gait Details: Pt needed cues for walker safety to stay close to RW.  Pt with flexed posture overall.  Pt needed constant cues  to stand tall. Pt fatigues quickly.  Took several standing rest breaks just to make it 18 feet.    Stairs            Wheelchair Mobility    Modified Rankin (Stroke Patients Only)       Balance Overall balance assessment: Needs assistance;History of Falls         Standing balance support: Bilateral upper extremity supported;During functional activity Standing balance-Leahy Scale: Poor Standing balance comment: Requires UE support for balance.                              Pertinent Vitals/Pain Pain Assessment: No/denies pain  VSS with O2 on RA 93-95% - nursing agreed for PT to leave O2 off and she will recheck.  140/53.  85-90 bpm.      Home Living Family/patient expects to be discharged to:: Private residence Living Arrangements: Children Available Help at Discharge: Family;Available 24 hours/day Type of Home: Mobile home Home Access: Ramped entrance     Home Layout: Multi-level Home Equipment: Cane - single point;Other (comment);Toilet riser;Hospital bed (tripod RW)      Prior Function Level of Independence: Needs assistance   Gait / Transfers Assistance Needed: ambulate in house without device independently; ambulated outdoors with cane  ADL's / Homemaking Assistance Needed: sponge bathes but sets up herself; Depends; dressing independent        Hand Dominance   Dominant Hand: Right    Extremity/Trunk Assessment   Upper Extremity Assessment: Defer to OT evaluation  Lower Extremity Assessment: Generalized weakness      Cervical / Trunk Assessment: Kyphotic  Communication   Communication: No difficulties  Cognition Arousal/Alertness: Awake/alert Behavior During Therapy: WFL for tasks assessed/performed Overall Cognitive Status: Within Functional Limits for tasks assessed                      General Comments      Exercises        Assessment/Plan    PT Assessment Patient needs continued PT services  PT  Diagnosis Generalized weakness   PT Problem List Decreased activity tolerance;Decreased balance;Decreased mobility;Decreased knowledge of use of DME;Decreased safety awareness;Decreased knowledge of precautions  PT Treatment Interventions DME instruction;Gait training;Functional mobility training;Therapeutic activities;Therapeutic exercise;Balance training;Patient/family education   PT Goals (Current goals can be found in the Care Plan section) Acute Rehab PT Goals Patient Stated Goal: to go home PT Goal Formulation: With patient Time For Goal Achievement: 03/15/15 Potential to Achieve Goals: Good    Frequency Min 3X/week   Barriers to discharge        Co-evaluation               End of Session Equipment Utilized During Treatment: Gait belt Activity Tolerance: Patient limited by fatigue Patient left: in chair;with call bell/phone within reach;with family/visitor present Nurse Communication: Mobility status         Time: [id number] PT Time Calculation (min) (ACUTE ONLY): 24 min   Charges:   PT Evaluation $Initial PT Evaluation Tier I: 1 Procedure PT Treatments $Gait Training: 8-22 mins   PT G CodesDenice Paradise 03-25-2015, 4:16 PM  Rainbow Hazen Brumett,PT Acute Rehabilitation [phone] (pager)

## 2015-03-08 NOTE — Progress Notes (Signed)
PHARMACIST - PHYSICIAN COMMUNICATION  CONCERNING:  Vancomycin   RECOMMENDATION: BC X 2 with E Coli -- Consider stopping Vancomycin and continuing Zosyn until sensitivities return?     Thank you. Anette Guarneri, PharmD [phone]

## 2015-03-09 LAB — CULTURE, BLOOD (ROUTINE X 2)

## 2015-03-09 LAB — CBC WITH DIFFERENTIAL/PLATELET
BASOS ABS: 0 10*3/uL (ref 0.0–0.1)
Basophils Relative: 0 % (ref 0–1)
EOS ABS: 0 10*3/uL (ref 0.0–0.7)
Eosinophils Relative: 0 % (ref 0–5)
HCT: 30.3 % — ABNORMAL LOW (ref 36.0–46.0)
Hemoglobin: 9.8 g/dL — ABNORMAL LOW (ref 12.0–15.0)
LYMPHS ABS: 0.7 10*3/uL (ref 0.7–4.0)
Lymphocytes Relative: 8 % — ABNORMAL LOW (ref 12–46)
MCH: 27.8 pg (ref 26.0–34.0)
MCHC: 32.3 g/dL (ref 30.0–36.0)
MCV: 85.8 fL (ref 78.0–100.0)
Monocytes Absolute: 0.7 10*3/uL (ref 0.1–1.0)
Monocytes Relative: 8 % (ref 3–12)
Neutro Abs: 7.3 10*3/uL (ref 1.7–7.7)
Neutrophils Relative %: 83 % — ABNORMAL HIGH (ref 43–77)
PLATELETS: 74 10*3/uL — AB (ref 150–400)
RBC: 3.53 MIL/uL — ABNORMAL LOW (ref 3.87–5.11)
RDW: 15 % (ref 11.5–15.5)
WBC: 8.7 10*3/uL (ref 4.0–10.5)

## 2015-03-09 LAB — COMPREHENSIVE METABOLIC PANEL
ALT: 13 U/L (ref 0–35)
AST: 21 U/L (ref 0–37)
Albumin: 2.2 g/dL — ABNORMAL LOW (ref 3.5–5.2)
Alkaline Phosphatase: 44 U/L (ref 39–117)
Anion gap: 4 — ABNORMAL LOW (ref 5–15)
BUN: 30 mg/dL — ABNORMAL HIGH (ref 6–23)
CALCIUM: 9.1 mg/dL (ref 8.4–10.5)
CHLORIDE: 105 mmol/L (ref 96–112)
CO2: 28 mmol/L (ref 19–32)
Creatinine, Ser: 1.42 mg/dL — ABNORMAL HIGH (ref 0.50–1.10)
GFR calc Af Amer: 39 mL/min — ABNORMAL LOW (ref >90)
GFR calc non Af Amer: 34 mL/min — ABNORMAL LOW (ref >90)
Glucose, Bld: 176 mg/dL — ABNORMAL HIGH (ref 70–99)
Potassium: 3.7 mmol/L (ref 3.5–5.1)
SODIUM: 137 mmol/L (ref 135–145)
TOTAL PROTEIN: 5.9 g/dL — AB (ref 6.0–8.3)
Total Bilirubin: 0.5 mg/dL (ref 0.3–1.2)

## 2015-03-09 LAB — GLUCOSE, CAPILLARY
GLUCOSE-CAPILLARY: 140 mg/dL — AB (ref 70–99)
GLUCOSE-CAPILLARY: 182 mg/dL — AB (ref 70–99)
GLUCOSE-CAPILLARY: 200 mg/dL — AB (ref 70–99)
GLUCOSE-CAPILLARY: 274 mg/dL — AB (ref 70–99)
Glucose-Capillary: 165 mg/dL — ABNORMAL HIGH (ref 70–99)
Glucose-Capillary: 224 mg/dL — ABNORMAL HIGH (ref 70–99)

## 2015-03-09 MED ORDER — SIMETHICONE 80 MG PO CHEW
80.0000 mg | CHEWABLE_TABLET | Freq: Once | ORAL | Status: AC
  Administered 2015-03-09: 80 mg via ORAL
  Filled 2015-03-09: qty 1

## 2015-03-09 NOTE — Progress Notes (Signed)
PROGRESS NOTE  [name] [id number] DOB: 11-Jan-1931 DOA: 03/06/2015 PCP: Sharion Balloon, FNP  HPI/Recap of past 24 hours: Feeling better, no fever, no sob, daughters at bedside.  Assessment/Plan: Active Problems:   Elevated troponin   Fever   History of diabetes mellitus   Left buttock pain  1- Elevated trop , likely demand ischemia, EKg inferolateral St depression, possible chronic  -No active CP, though baseline CAD -noted myoview 07/2013 w/ no evidence of ischemia/infarct -full dose ASA -nitropaste -cycle CEs. Echo with normal LVEF, with grade II diastolic dysfuntion -risk stratification labs -tele bed   2-fever /leukocytosis: uti? Aspiration? Also reported left buttock pain, though Ct ab/pelvic no correlated findings to left buttock -noted suprapubic pain with CT imaging concerning for perinephric stranding -start IV rocephin empirically, broaden abx to vanc/zosynon 3/31 due to spiking fever and confusion, pccm consulted on 3/31 and patient transferred to stepdown unit -urine culture + ecoli, blood culture 2/2 g-rods, repeat cxrimproving on 4/1, d/c vanc, continue zosyn on 4/1. -swallow eval/start soft diet for now/aspiration precaution -feeling better, no fever on 4/2, awaiting final culture   3-CAD s/p CABG, last stress test 2014, no acute findings -no active CP in setting of above -full dose ASA -cont home regimen  -tele bed    4-Chronic diastolic heart failure -2D ECHO pending -euvolemic to dry on exam  -strict Is and Os, daily weights , on gentle hydration currently  5-IDDM -SSI  -NPH  -A1C -hold orals  6. H/o cva/carotid stenosis, at baseline with right sided facial droop/slurred speech/partical visual field loss.  PT/speech  FEN/GI: heart healthy-carb modified diet/soft diet  Prophylaxis: sub q heparin  Disposition: likely home with home health Code Status:discussed with family, initially states partial code, then changed to  full code.   Family Communication: patient and daughter   Consultants:  PCCM on 3/31  Procedures:  PICC line 3/31  Antibiotics:  Rocephin from admission to 3/31  Vanc 3/31 and 4/1  zosyn from 3/31   Objective: BP 171/76 mmHg  Pulse 87  Temp(Src) 99.4 F (37.4 C) (Oral)  Resp 18  Ht 5\' 6"  (1.676 m)  Wt 85.276 kg (188 lb)  BMI 30.36 kg/m2  SpO2 97%  Intake/Output Summary (Last 24 hours) at 03/09/15 1919 Last data filed at 03/09/15 1303  Gross per 24 hour  Intake     13 ml  Output   1875 ml  Net  -1862 ml   Filed Weights   03/07/15 0534 03/08/15 0400 03/09/15 0400  Weight: 87.3 kg (192 lb 7.4 oz) 85.73 kg (189 lb) 85.276 kg (188 lb)    Exam:   General:  NAD, frail, alert  Cardiovascular: RRR  Respiratory: CTABL  Abdomen: Soft/ND/NT, positive BS  Musculoskeletal: No Edema  Neuro: chronic deficit ( aphasia) from prior cva 28yrs ago, no new findings  Skin, left buttock unremarkable, no induration, no erythema, no significant tender.  Data Reviewed: Basic Metabolic Panel:  Recent Labs Lab 03/06/15 0120 03/06/15 0406 03/07/15 0400 03/08/15 0505 03/09/15 0412  NA 136 134* 134* 139 137  K 4.0 4.1 3.8 3.4* 3.7  CL 101 99 101 106 105  CO2 27 26 26 29 28   GLUCOSE 285* 305* 92 108* 176*  BUN 23 24* 23 27* 30*  CREATININE 1.21* 1.22* 1.28* 1.38* 1.42*  CALCIUM 9.5 9.2 8.8 8.8 9.1  MG  --   --   --  2.0  --    Liver Function Tests:  Recent Labs  Lab 03/06/15 0120 03/06/15 0406 03/07/15 0400 03/08/15 0505 03/09/15 0412  AST 21 21 21 21 21   ALT 14 14 13 13 13   ALKPHOS 54 48 39 39 44  BILITOT 0.6 0.5 0.5 0.6 0.5  PROT 7.0 6.6 5.8* 5.5* 5.9*  ALBUMIN 3.4* 3.2* 2.6* 2.2* 2.2*    Recent Labs Lab 03/06/15 0120  LIPASE 23   No results for input(s): AMMONIA in the last 168 hours. CBC:  Recent Labs Lab 03/06/15 0120 03/06/15 0406 03/07/15 0400 03/08/15 0505 03/09/15 0412  WBC 7.7 12.5* 10.4 7.8 8.7  NEUTROABS 7.4 11.9* 8.6* 7.0  7.3  HGB 11.7* 11.5* 9.8* 9.5* 9.8*  HCT 35.5* 35.2* 30.9* 29.2* 30.3*  MCV 87.7 88.0 88.3 87.4 85.8  PLT 132* 128* 109* 75* 74*   Cardiac Enzymes:    Recent Labs Lab 03/06/15 0120 03/06/15 0406 03/06/15 1555 03/07/15 1735  TROPONINI 0.44* 0.45* 0.35* 0.63*   BNP (last 3 results) No results for input(s): BNP in the last 8760 hours.  ProBNP (last 3 results) No results for input(s): PROBNP in the last 8760 hours.  CBG:  Recent Labs Lab 03/09/15 0004 03/09/15 0406 03/09/15 0741 03/09/15 1142 03/09/15 1641  GLUCAP 165* 140* 182* 224* 200*    Recent Results (from the past 240 hour(s))  Culture, blood (routine x 2)     Status: None   Collection Time: 03/06/15  1:45 PM  Result Value Ref Range Status   Specimen Description BLOOD LEFT ARM  Final   Special Requests BOTTLES DRAWN AEROBIC ONLY [name]  Final   Culture   Final    ESCHERICHIA COLI Note: SUSCEPTIBILITIES PERFORMED ON PREVIOUS CULTURE WITHIN THE LAST 5 DAYS. Note: Gram Stain Report Called to,Read Back By and Verified With: DEREK D 03/07/15 1355 BY SMITHERSJ Performed at [facility]    Report Status 03/09/2015 FINAL  Final  Culture, blood (routine x 2)     Status: None   Collection Time: 03/06/15  1:51 PM  Result Value Ref Range Status   Specimen Description BLOOD LEFT HAND  Final   Special Requests BOTTLES DRAWN AEROBIC ONLY 3.5CC  Final   Culture   Final    ESCHERICHIA COLI Note: Gram Stain Report Called to,Read Back By and Verified With: DERRICK RN ON 2W AT [id number] BY CASTC Performed at [facility]    Report Status 03/09/2015 FINAL  Final   Organism ID, Bacteria ESCHERICHIA COLI  Final      Susceptibility   Escherichia coli - MIC*    AMPICILLIN >=32 RESISTANT Resistant     AMPICILLIN/SULBACTAM 8 SENSITIVE Sensitive     CEFAZOLIN >=64 RESISTANT Resistant     CEFEPIME <=1 SENSITIVE Sensitive     CEFTAZIDIME <=1 SENSITIVE Sensitive     CEFTRIAXONE <=1 SENSITIVE Sensitive      CIPROFLOXACIN <=0.25 SENSITIVE Sensitive     GENTAMICIN <=1 SENSITIVE Sensitive     IMIPENEM <=0.25 SENSITIVE Sensitive     PIP/TAZO <=4 SENSITIVE Sensitive     TOBRAMYCIN <=1 SENSITIVE Sensitive     TRIMETH/SULFA <=20 SENSITIVE Sensitive     * ESCHERICHIA COLI  Culture, Urine     Status: None   Collection Time: 03/06/15  4:55 PM  Result Value Ref Range Status   Specimen Description URINE, CATHETERIZED  Final   Special Requests NONE  Final   Colony Count   Final    >=100,000 COLONIES/ML Performed at [facility]    Culture   Final  ESCHERICHIA COLI Performed at [facility]    Report Status 03/08/2015 FINAL  Final   Organism ID, Bacteria ESCHERICHIA COLI  Final      Susceptibility   Escherichia coli - MIC*    AMPICILLIN >=32 RESISTANT Resistant     CEFAZOLIN <=4 SENSITIVE Sensitive     CEFTRIAXONE <=1 SENSITIVE Sensitive     CIPROFLOXACIN <=0.25 SENSITIVE Sensitive     GENTAMICIN <=1 SENSITIVE Sensitive     LEVOFLOXACIN <=0.12 SENSITIVE Sensitive     NITROFURANTOIN <=16 SENSITIVE Sensitive     TOBRAMYCIN <=1 SENSITIVE Sensitive     TRIMETH/SULFA <=20 SENSITIVE Sensitive     PIP/TAZO <=4 SENSITIVE Sensitive     * ESCHERICHIA COLI  Culture, blood (routine x 2)     Status: None (Preliminary result)   Collection Time: 03/07/15 11:30 AM  Result Value Ref Range Status   Specimen Description BLOOD LEFT ARM  Final   Special Requests BOTTLES DRAWN AEROBIC ONLY 5CC  Final   Culture   Final           BLOOD CULTURE RECEIVED NO GROWTH TO DATE CULTURE WILL BE HELD FOR 5 DAYS BEFORE ISSUING A FINAL NEGATIVE REPORT Performed at [facility]    Report Status PENDING  Incomplete  MRSA PCR Screening     Status: None   Collection Time: 03/07/15  4:50 PM  Result Value Ref Range Status   MRSA by PCR NEGATIVE NEGATIVE Final    Comment:        The GeneXpert MRSA Assay (FDA approved for NASAL specimens only), is one component of a comprehensive MRSA  colonization surveillance program. It is not intended to diagnose MRSA infection nor to guide or monitor treatment for MRSA infections.      Studies: Dg Chest Port 1 View  03/08/2015   CLINICAL DATA:  Respiratory failure .  EXAM: PORTABLE CHEST - 1 VIEW  COMPARISON:  03/07/2015 .  FINDINGS: Right PICC line noted at cavoatrial junction. Prior CABG. Cardiomegaly with pulmonary vascular prominence and interstitial prominence consistent with congestive heart failure. Interim slight clearing of pulmonary interstitial edema. No pleural effusion or pneumothorax.  IMPRESSION: 1. PICC line in stable position. 2. Prior CABG. Cardiomegaly. Interim slight clearing of pulmonary interstitial edema.   Electronically Signed   By: Marcello Moores  Register   On: 03/08/2015 07:53    Scheduled Meds: . acidophilus  1 capsule Oral Daily  . aspirin EC  325 mg Oral Daily  . clopidogrel  75 mg Oral Daily  . insulin aspart  0-9 Units Subcutaneous 6 times per day  . pantoprazole  80 mg Oral Q1200  . piperacillin-tazobactam (ZOSYN)  IV  3.375 g Intravenous Q8H  . sodium chloride  10-40 mL Intracatheter Q12H  . sodium chloride  3 mL Intravenous Q12H    Continuous Infusions: . sodium chloride 75 mL/hr at 03/06/15 2327  . sodium chloride 50 mL/hr (03/09/15 1721)     Time spent: 30mins   Suzy Kugel MD, PhD  [facility] Pager [phone]. If 7PM-7AM, please contact night-coverage at [email or web address], password [facility] 03/09/2015, 7:19 PM  LOS: 3 days

## 2015-03-09 NOTE — Progress Notes (Signed)
Pt complaining of gas and bloating. Called on-call, meds ordered

## 2015-03-10 DIAGNOSIS — A4151 Sepsis due to Escherichia coli [E. coli]: Principal | ICD-10-CM

## 2015-03-10 LAB — CBC WITH DIFFERENTIAL/PLATELET
BASOS ABS: 0 10*3/uL (ref 0.0–0.1)
BASOS PCT: 0 % (ref 0–1)
EOS ABS: 0 10*3/uL (ref 0.0–0.7)
EOS PCT: 0 % (ref 0–5)
HCT: 29.1 % — ABNORMAL LOW (ref 36.0–46.0)
Hemoglobin: 9.7 g/dL — ABNORMAL LOW (ref 12.0–15.0)
Lymphocytes Relative: 9 % — ABNORMAL LOW (ref 12–46)
Lymphs Abs: 0.9 10*3/uL (ref 0.7–4.0)
MCH: 28.6 pg (ref 26.0–34.0)
MCHC: 33.3 g/dL (ref 30.0–36.0)
MCV: 85.8 fL (ref 78.0–100.0)
Monocytes Absolute: 1 10*3/uL (ref 0.1–1.0)
Monocytes Relative: 11 % (ref 3–12)
Neutro Abs: 7.8 10*3/uL — ABNORMAL HIGH (ref 1.7–7.7)
Neutrophils Relative %: 80 % — ABNORMAL HIGH (ref 43–77)
PLATELETS: 100 10*3/uL — AB (ref 150–400)
RBC: 3.39 MIL/uL — ABNORMAL LOW (ref 3.87–5.11)
RDW: 14.9 % (ref 11.5–15.5)
WBC: 9.8 10*3/uL (ref 4.0–10.5)

## 2015-03-10 LAB — COMPREHENSIVE METABOLIC PANEL
ALBUMIN: 2 g/dL — AB (ref 3.5–5.2)
ALK PHOS: 42 U/L (ref 39–117)
ALT: 12 U/L (ref 0–35)
ANION GAP: 9 (ref 5–15)
AST: 19 U/L (ref 0–37)
BUN: 22 mg/dL (ref 6–23)
CHLORIDE: 101 mmol/L (ref 96–112)
CO2: 24 mmol/L (ref 19–32)
CREATININE: 1.19 mg/dL — AB (ref 0.50–1.10)
Calcium: 8.9 mg/dL (ref 8.4–10.5)
GFR calc Af Amer: 48 mL/min — ABNORMAL LOW (ref >90)
GFR calc non Af Amer: 42 mL/min — ABNORMAL LOW (ref >90)
Glucose, Bld: 179 mg/dL — ABNORMAL HIGH (ref 70–99)
POTASSIUM: 3.4 mmol/L — AB (ref 3.5–5.1)
Sodium: 134 mmol/L — ABNORMAL LOW (ref 135–145)
Total Bilirubin: 0.6 mg/dL (ref 0.3–1.2)
Total Protein: 5.5 g/dL — ABNORMAL LOW (ref 6.0–8.3)

## 2015-03-10 LAB — GLUCOSE, CAPILLARY
GLUCOSE-CAPILLARY: 148 mg/dL — AB (ref 70–99)
GLUCOSE-CAPILLARY: 183 mg/dL — AB (ref 70–99)
Glucose-Capillary: 214 mg/dL — ABNORMAL HIGH (ref 70–99)
Glucose-Capillary: 216 mg/dL — ABNORMAL HIGH (ref 70–99)
Glucose-Capillary: 225 mg/dL — ABNORMAL HIGH (ref 70–99)
Glucose-Capillary: 238 mg/dL — ABNORMAL HIGH (ref 70–99)
Glucose-Capillary: 250 mg/dL — ABNORMAL HIGH (ref 70–99)

## 2015-03-10 MED ORDER — METOPROLOL TARTRATE 50 MG PO TABS
50.0000 mg | ORAL_TABLET | Freq: Two times a day (BID) | ORAL | Status: DC
  Administered 2015-03-10 – 2015-03-11 (×3): 50 mg via ORAL
  Filled 2015-03-10 (×3): qty 1

## 2015-03-10 NOTE — Clinical Documentation Improvement (Signed)
Presents with elevated troponin's - likely demand ischemia, vomiting, uti, fever, sepsis mentioned by CCM in consult note along with respiratory failure and metabolic encephalopathy. No mention of these last 3 conditions documented in any other notes; problem list is not updated with these 3 conditions as well..  After study, please clarify the likely cause of admission and document findings in next progress note and include in discharge summary if applicable:   Sepsis  UTI  Demand Ischemia  Other Condition  Unable to Determine  Supporting Information: WBC is ranging from 7.8 to 10.4, Neutrophils elevated, Lactic Acid is 1.2 x 2 results, Temp 101.3 orally, 101.6 rectally  Thank You, Zoila Shutter ,RN Clinical Documentation Specialist:  [place] [employer] [facility]

## 2015-03-10 NOTE — Progress Notes (Addendum)
PROGRESS NOTE  [name] [id number] DOB: 22-Dec-1930 DOA: 03/06/2015 PCP: Sharion Balloon, FNP  HPI/Recap of past 24 hours: Feeling better, no fever, no sob, daughters at bedside.  Assessment/Plan: Active Problems:   Elevated troponin   Fever   History of diabetes mellitus   Left buttock pain  1- Elevated trop , likely demand ischemia, EKg inferolateral St depression, possible chronic  -No active CP, though baseline CAD -noted myoview 07/2013 w/ no evidence of ischemia/infarct -full dose ASA -Echo 3/31 with normal LVEF, no wall motion abnormality, with grade II diastolic dysfuntion   2-fever /leukocytosis/sepsis: uti? Aspiration?  -meet sepsis criteria by fever/tachypnea/leukocytosis/approved infection -noted suprapubic pain with CT imaging concerning for perinephric stranding -started IV rocephin empirically on admission,  -broaden abx to vanc/zosynon 3/31 due to spiking fever and confusion, pccm consulted on 3/31 and patient transferred to stepdown unit -urine culture + ecoli, blood culture +ecoli 2/2 , repeat cxrimproving on 4/1, d/c vanc on 4/1. -swallow eval/start soft diet for now/aspiration precaution -improving, continue zosyn,( since patient condition worsened on rocephin initially)   3-CAD s/p CABG, last stress test 2014, no acute findings -no active CP in setting of above -full dose ASA -cont home regimen  -stable, d/c tele bed    4-Chronic diastolic heart failure -2D ECHO no acute findings -euvolemic to dry on exam  -strict Is and Os, daily weights ,  -received gentle hydration, ivf stopped on 4/2 with improvement  5-IDDM -SSI  -NPH at home -A1C 8.7 -hold oral meds -will need to adjust insulin dose prior to discharge,   6. H/o cva/carotid stenosis, at baseline with right sided facial droop/slurred speech/partical visual field loss.  PT/speech  FEN/GI: heart healthy-carb modified diet/soft diet  Prophylaxis: sub q heparin   Disposition: likely home with home healthon Monday, family declined home health Code Status:discussed with family, initially states partial code, then changed to full code.   Family Communication: patient and two daughters   Consultants:  PCCM on 3/31  Procedures:  PICC line 3/31  Antibiotics:  Rocephin from admission to 3/31  Vanc 3/31 and 4/1  zosyn from 3/31   Objective: BP 159/70 mmHg  Pulse 70  Temp(Src) 98 F (36.7 C) (Oral)  Resp 26  Ht 5\' 6"  (1.676 m)  Wt 85.345 kg (188 lb 2.4 oz)  BMI 30.38 kg/m2  SpO2 98%  Intake/Output Summary (Last 24 hours) at 03/10/15 1356 Last data filed at 03/10/15 0956  Gross per 24 hour  Intake     13 ml  Output   1550 ml  Net  -1537 ml   Filed Weights   03/08/15 0400 03/09/15 0400 03/10/15 0400  Weight: 85.73 kg (189 lb) 85.276 kg (188 lb) 85.345 kg (188 lb 2.4 oz)    Exam:   General:  NAD, frail, alert  Cardiovascular: RRR  Respiratory: CTABL  Abdomen: Soft/ND/NT, positive BS  Musculoskeletal: No Edema  Neuro: chronic deficit ( aphasia) from prior cva 37yrs ago, no new findings  Skin, left buttock unremarkable, no induration, no erythema, no significant tender.  Data Reviewed: Basic Metabolic Panel:  Recent Labs Lab 03/06/15 0406 03/07/15 0400 03/08/15 0505 03/09/15 0412 03/10/15 0500  NA 134* 134* 139 137 134*  K 4.1 3.8 3.4* 3.7 3.4*  CL 99 101 106 105 101  CO2 26 26 29 28 24   GLUCOSE 305* 92 108* 176* 179*  BUN 24* 23 27* 30* 22  CREATININE 1.22* 1.28* 1.38* 1.42* 1.19*  CALCIUM 9.2 8.8 8.8 9.1  8.9  MG  --   --  2.0  --   --    Liver Function Tests:  Recent Labs Lab 03/06/15 0406 03/07/15 0400 03/08/15 0505 03/09/15 0412 03/10/15 0500  AST 21 21 21 21 19   ALT 14 13 13 13 12   ALKPHOS 48 39 39 44 42  BILITOT 0.5 0.5 0.6 0.5 0.6  PROT 6.6 5.8* 5.5* 5.9* 5.5*  ALBUMIN 3.2* 2.6* 2.2* 2.2* 2.0*    Recent Labs Lab 03/06/15 0120  LIPASE 23   No results for input(s): AMMONIA in  the last 168 hours. CBC:  Recent Labs Lab 03/06/15 0406 03/07/15 0400 03/08/15 0505 03/09/15 0412 03/10/15 0500  WBC 12.5* 10.4 7.8 8.7 9.8  NEUTROABS 11.9* 8.6* 7.0 7.3 7.8*  HGB 11.5* 9.8* 9.5* 9.8* 9.7*  HCT 35.2* 30.9* 29.2* 30.3* 29.1*  MCV 88.0 88.3 87.4 85.8 85.8  PLT 128* 109* 75* 74* 100*   Cardiac Enzymes:    Recent Labs Lab 03/06/15 0120 03/06/15 0406 03/06/15 1555 03/07/15 1735  TROPONINI 0.44* 0.45* 0.35* 0.63*   BNP (last 3 results) No results for input(s): BNP in the last 8760 hours.  ProBNP (last 3 results) No results for input(s): PROBNP in the last 8760 hours.  CBG:  Recent Labs Lab 03/09/15 2007 03/10/15 0002 03/10/15 0420 03/10/15 0751 03/10/15 1132  GLUCAP 274* 216* 225* 148* 214*    Recent Results (from the past 240 hour(s))  Culture, blood (routine x 2)     Status: None   Collection Time: 03/06/15  1:45 PM  Result Value Ref Range Status   Specimen Description BLOOD LEFT ARM  Final   Special Requests BOTTLES DRAWN AEROBIC ONLY [place]  Final   Culture   Final    ESCHERICHIA COLI Note: SUSCEPTIBILITIES PERFORMED ON PREVIOUS CULTURE WITHIN THE LAST 5 DAYS. Note: Gram Stain Report Called to,Read Back By and Verified With: DEREK D 03/07/15 1355 BY SMITHERSJ Performed at [facility]    Report Status 03/09/2015 FINAL  Final  Culture, blood (routine x 2)     Status: None   Collection Time: 03/06/15  1:51 PM  Result Value Ref Range Status   Specimen Description BLOOD LEFT HAND  Final   Special Requests BOTTLES DRAWN AEROBIC ONLY 3.5CC  Final   Culture   Final    ESCHERICHIA COLI Note: Gram Stain Report Called to,Read Back By and Verified With: DERRICK RN ON 2W AT [id number] BY CASTC Performed at [facility]    Report Status 03/09/2015 FINAL  Final   Organism ID, Bacteria ESCHERICHIA COLI  Final      Susceptibility   Escherichia coli - MIC*    AMPICILLIN >=32 RESISTANT Resistant     AMPICILLIN/SULBACTAM 8 SENSITIVE  Sensitive     CEFAZOLIN >=64 RESISTANT Resistant     CEFEPIME <=1 SENSITIVE Sensitive     CEFTAZIDIME <=1 SENSITIVE Sensitive     CEFTRIAXONE <=1 SENSITIVE Sensitive     CIPROFLOXACIN <=0.25 SENSITIVE Sensitive     GENTAMICIN <=1 SENSITIVE Sensitive     IMIPENEM <=0.25 SENSITIVE Sensitive     PIP/TAZO <=4 SENSITIVE Sensitive     TOBRAMYCIN <=1 SENSITIVE Sensitive     TRIMETH/SULFA <=20 SENSITIVE Sensitive     * ESCHERICHIA COLI  Culture, Urine     Status: None   Collection Time: 03/06/15  4:55 PM  Result Value Ref Range Status   Specimen Description URINE, CATHETERIZED  Final   Special Requests NONE  Final  Colony Count   Final    >=100,000 COLONIES/ML Performed at [facility]    Culture   Final    ESCHERICHIA COLI Performed at [facility]    Report Status 03/08/2015 FINAL  Final   Organism ID, Bacteria ESCHERICHIA COLI  Final      Susceptibility   Escherichia coli - MIC*    AMPICILLIN >=32 RESISTANT Resistant     CEFAZOLIN <=4 SENSITIVE Sensitive     CEFTRIAXONE <=1 SENSITIVE Sensitive     CIPROFLOXACIN <=0.25 SENSITIVE Sensitive     GENTAMICIN <=1 SENSITIVE Sensitive     LEVOFLOXACIN <=0.12 SENSITIVE Sensitive     NITROFURANTOIN <=16 SENSITIVE Sensitive     TOBRAMYCIN <=1 SENSITIVE Sensitive     TRIMETH/SULFA <=20 SENSITIVE Sensitive     PIP/TAZO <=4 SENSITIVE Sensitive     * ESCHERICHIA COLI  Culture, blood (routine x 2)     Status: None (Preliminary result)   Collection Time: 03/07/15 11:30 AM  Result Value Ref Range Status   Specimen Description BLOOD LEFT ARM  Final   Special Requests BOTTLES DRAWN AEROBIC ONLY 5CC  Final   Culture   Final           BLOOD CULTURE RECEIVED NO GROWTH TO DATE CULTURE WILL BE HELD FOR 5 DAYS BEFORE ISSUING A FINAL NEGATIVE REPORT Performed at [facility]    Report Status PENDING  Incomplete  MRSA PCR Screening     Status: None   Collection Time: 03/07/15  4:50 PM  Result Value Ref Range Status   MRSA  by PCR NEGATIVE NEGATIVE Final    Comment:        The GeneXpert MRSA Assay (FDA approved for NASAL specimens only), is one component of a comprehensive MRSA colonization surveillance program. It is not intended to diagnose MRSA infection nor to guide or monitor treatment for MRSA infections.      Studies: No results found.  Scheduled Meds: . acidophilus  1 capsule Oral Daily  . aspirin EC  325 mg Oral Daily  . clopidogrel  75 mg Oral Daily  . insulin aspart  0-9 Units Subcutaneous 6 times per day  . metoprolol  50 mg Oral BID  . pantoprazole  80 mg Oral Q1200  . piperacillin-tazobactam (ZOSYN)  IV  3.375 g Intravenous Q8H  . sodium chloride  10-40 mL Intracatheter Q12H  . sodium chloride  3 mL Intravenous Q12H    Continuous Infusions:     Time spent: 72mins   Wendi Lastra MD, PhD  [facility] Pager [phone]. If 7PM-7AM, please contact night-coverage at [email or web address], password [facility] 03/10/2015, 1:56 PM  LOS: 4 days

## 2015-03-11 DIAGNOSIS — R748 Abnormal levels of other serum enzymes: Secondary | ICD-10-CM

## 2015-03-11 DIAGNOSIS — I10 Essential (primary) hypertension: Secondary | ICD-10-CM

## 2015-03-11 DIAGNOSIS — Z8639 Personal history of other endocrine, nutritional and metabolic disease: Secondary | ICD-10-CM

## 2015-03-11 LAB — BASIC METABOLIC PANEL
ANION GAP: 5 (ref 5–15)
BUN: 16 mg/dL (ref 6–23)
CHLORIDE: 103 mmol/L (ref 96–112)
CO2: 28 mmol/L (ref 19–32)
CREATININE: 1.02 mg/dL (ref 0.50–1.10)
Calcium: 8.9 mg/dL (ref 8.4–10.5)
GFR calc Af Amer: 58 mL/min — ABNORMAL LOW (ref >90)
GFR calc non Af Amer: 50 mL/min — ABNORMAL LOW (ref >90)
Glucose, Bld: 184 mg/dL — ABNORMAL HIGH (ref 70–99)
POTASSIUM: 3.4 mmol/L — AB (ref 3.5–5.1)
Sodium: 136 mmol/L (ref 135–145)

## 2015-03-11 LAB — CBC WITH DIFFERENTIAL/PLATELET
BASOS ABS: 0 10*3/uL (ref 0.0–0.1)
BASOS PCT: 0 % (ref 0–1)
EOS PCT: 1 % (ref 0–5)
Eosinophils Absolute: 0.1 10*3/uL (ref 0.0–0.7)
HEMATOCRIT: 29.9 % — AB (ref 36.0–46.0)
HEMOGLOBIN: 9.8 g/dL — AB (ref 12.0–15.0)
Lymphocytes Relative: 13 % (ref 12–46)
Lymphs Abs: 1.2 10*3/uL (ref 0.7–4.0)
MCH: 27.8 pg (ref 26.0–34.0)
MCHC: 32.8 g/dL (ref 30.0–36.0)
MCV: 84.7 fL (ref 78.0–100.0)
MONO ABS: 1 10*3/uL (ref 0.1–1.0)
MONOS PCT: 11 % (ref 3–12)
Neutro Abs: 6.9 10*3/uL (ref 1.7–7.7)
Neutrophils Relative %: 75 % (ref 43–77)
Platelets: 107 10*3/uL — ABNORMAL LOW (ref 150–400)
RBC: 3.53 MIL/uL — ABNORMAL LOW (ref 3.87–5.11)
RDW: 15 % (ref 11.5–15.5)
WBC: 9.2 10*3/uL (ref 4.0–10.5)

## 2015-03-11 LAB — GLUCOSE, CAPILLARY
GLUCOSE-CAPILLARY: 166 mg/dL — AB (ref 70–99)
Glucose-Capillary: 181 mg/dL — ABNORMAL HIGH (ref 70–99)
Glucose-Capillary: 187 mg/dL — ABNORMAL HIGH (ref 70–99)

## 2015-03-11 LAB — MAGNESIUM: Magnesium: 1.7 mg/dL (ref 1.5–2.5)

## 2015-03-11 LAB — TROPONIN I: TROPONIN I: 0.42 ng/mL — AB (ref <0.031)

## 2015-03-11 MED ORDER — INSULIN REGULAR HUMAN 100 UNIT/ML IJ SOLN: INTRAMUSCULAR | Status: DC

## 2015-03-11 MED ORDER — SULFAMETHOXAZOLE-TRIMETHOPRIM 800-160 MG PO TABS: 1.0000 | ORAL_TABLET | Freq: Two times a day (BID) | ORAL | Status: DC

## 2015-03-11 MED ORDER — LISINOPRIL 20 MG PO TABS
20.0000 mg | ORAL_TABLET | Freq: Every day | ORAL | Status: DC
  Administered 2015-03-11: 20 mg via ORAL
  Filled 2015-03-11: qty 1

## 2015-03-11 MED ORDER — RISAQUAD PO CAPS: 1.0000 | ORAL_CAPSULE | Freq: Every day | ORAL | Status: DC

## 2015-03-11 MED ORDER — INSULIN NPH (HUMAN) (ISOPHANE) 100 UNIT/ML ~~LOC~~ SUSP: SUBCUTANEOUS | Status: DC

## 2015-03-11 MED ORDER — ISOSORBIDE MONONITRATE ER 30 MG PO TB24: 30.0000 mg | ORAL_TABLET | Freq: Every day | ORAL | Status: DC

## 2015-03-11 NOTE — Care Management Note (Signed)
    Page 1 of 1   03/11/2015     2:45:17 PM CARE MANAGEMENT NOTE 03/11/2015  Patient:  [name], [name]   Account Number:  [id number]  Date Initiated:  03/11/2015  Documentation initiated by:  GRAVES-BIGELOW,Maria Coin  Subjective/Objective Assessment:   Pt admitted for Positive troponins- N/V. Pt is from home with family support.     Action/Plan:   CM did speak with family in regards to [facility] services. Pt declines services at this time. IV abx to be changed to po. No further needs at this time.   Anticipated DC Date:  03/11/2015   Anticipated DC Plan:  HOME/SELF CARE         Choice offered to / List presented to:  C-1 Patient           Status of service:  Completed, signed off Medicare Important Message given?  YES (If response is "NO", the following Medicare IM given date fields will be blank) Date Medicare IM given:  03/11/2015 Medicare IM given by:  GRAVES-BIGELOW,Merle Cirelli Date Additional Medicare IM given:   Additional Medicare IM given by:    Discharge Disposition:  HOME/SELF CARE  Per UR Regulation:  Reviewed for med. necessity/level of care/duration of stay  If discussed at [place] of Stay Meetings, dates discussed:    Comments:

## 2015-03-11 NOTE — Discharge Summary (Addendum)
Discharge Summary  [name] [id number] DOB: [birth year]  PCP: Sharion Balloon, FNP  Admit date: 03/06/2015 Discharge date: 03/11/2015  Time spent: >90mins  Recommendations for Outpatient Follow-up:  1. F/u with PMD in one week, pmd to repeat bmp in one week, pmd to continue adjust blood pressure and blood sugar meds.  2. New meds: bactrim/imdur/insulin sliding scale  Discharge Diagnoses:  Active Hospital Problems   Diagnosis Date Noted  . Elevated troponin 03/06/2015  . Fever   . History of diabetes mellitus   . Left buttock pain     Resolved Hospital Problems   Diagnosis Date Noted Date Resolved  No resolved problems to display.    Discharge Condition: stable  Diet recommendation: heart healthy/carb modified  Filed Weights   03/09/15 0400 03/10/15 0400 03/11/15 0400  Weight: 85.276 kg (188 lb) 85.345 kg (188 lb 2.4 oz) 86.002 kg (189 lb 9.6 oz)    History of present illness:  History of Present Illness:This is a 81 y.o. year old female with significant past medical history of dementia, multiple CVAs, IDDM, chronic diastolic heart failure, CAD s/p stents presenting with elevated trop, likely UTI. Family report patient with vomiting over the course of the day. Also reports of lower abdominal pain. Patient fairly poorly functioning at baseline. No other significant complaints per family. Patient did report having some mild intermittent chest pain about one week ago. Otherwise history not equivocal. Family and patient deny any diarrhea, fever or chills. No headache. Presented to the [facility] ER temperature 97.8, heart rate in the 90s, respirations 20s, blood pressure in the 140s to 170s, satting 100% on supplemental oxygen. White blood cell count 7.7, hemoglobin 11.7, creatinine 1.21, troponin 0.44. EKG with nonspecific T-wave changes per ER physician report (formal EKG not available currently). Glucose 285. CT of abdomen and pelvis obtained showing perinephric  stranding in both kidneys with greater enhancement on the right concerning for possible UTI. Nonspecific colonic wall thickening.  EDP discussed case w/ cards fellow at [facility]. Recommend admission to hospitalist and transfer to [facility].  Assessment and Plan: [name] is a 81 y.o. year old female presenting with elevated trop, likely UTI   Hospital Course:  Active Problems:   Elevated troponin   Fever   History of diabetes mellitus   Left buttock pain  fever /leukocytosis/sepsis/ecoli UTI/Ecoli bacteremia -sepsis: by fever/tachypnea/leukocytosis/approved infection -noted suprapubic pain with CT imaging concerning for perinephric stranding -started IV rocephin empirically on admission,  -broaden abx to vanc/zosynon 3/31 due to spiking fever and confusion, pccm consulted on 3/31 and patient transferred to stepdown unit -urine culture + ecoli, blood culture +ecoli 2/2 , repeat cxrimproving on 4/1, d/c vanc on 4/1. -swallow eval/start soft diet for now/aspiration precaution -improving, continue zosyn,( since patient condition worsened on rocephin initially) Continue to improved, repeat blood culture no growth, discharge on bactrim DS.  Elevated trop , likely demand ischemia, EKg inferolateral St depression, possible chronic  -No active CP, though baseline CAD -noted myoview 07/2013 w/ no evidence of ischemia/infarct -full dose ASA -repeat Echo 3/31 with normal LVEF, no wall motion abnormality, with grade II diastolic dysfuntion -outpatient cardiology follow up.  HTN:  Not well controlled. acei has to be on hold due to elevation of cr initially, acei restarted at discharge, imdur added to bp meds regimen, metoprolol continued. pmd to continue titrate bp meds, monitor cr level.  CAD s/p CABG, last stress test 2014, no acute findings -no active CP in setting of above -full  dose ASA -cont home regimen  -stable, d/c tele bed   Chronic diastolic heart failure -2D ECHO no acute  findings -euvolemic to dry on exam  -strict Is and Os, daily weights ,  -received gentle hydration, ivf stopped on 4/2. -euvolemic at discharge, no edema, no sob, on room air, lung clear.  IDDM, insulin dependent -SSI  -NPH at home -A1C 8.7 -oral meds held during hospitalization. Metformin restarted at discharge, pmd to monitor creatinine level.  -patient has not been needing any scheduled dose insulin, blood sugar well controlled with ssi with only 2-3 units a few times a day. Further investigation, patient has been instructed on taking scheduled insulin 65units daily, discussed with family that will reduce scheduled insulin to 15units daily, add sliding scale insulin, blood sugar control discrepancy between hospital and at home, possible explanations including incorrect injection technique vs diet noncompliance, PMD to continue monitor blood sugar and insulin dose. Discussed this with daughter at length.    H/o cva/carotid stenosis, at baseline with right sided facial droop/slurred speech/partical visual field loss. PT/speech  FEN/GI: heart healthy-carb modified diet/soft diet   Disposition: d/c home, family declined home health Code Status:discussed with family, confirmed full code.   Family Communication: patient and two daughters   Consultants:  PCCM on 3/31  Procedures:  PICC line 3/31  Antibiotics:  Rocephin from admission to 3/31  Vanc 3/31 and 4/1 zosyn from 3/31   Discharge Exam: BP 173/64 mmHg  Pulse 71  Temp(Src) 99.7 F (37.6 C) (Oral)  Resp 18  Ht 5\' 6"  (1.676 m)  Wt 86.002 kg (189 lb 9.6 oz)  BMI 30.62 kg/m2  SpO2 95%   General: NAD, frail, alert  Cardiovascular: RRR  Respiratory: CTABL  Abdomen: Soft/ND/NT, positive BS  Musculoskeletal: No Edema  Neuro: chronic deficit ( aphasia) from prior cva 51yrs ago, no new findings  Skin, left buttock unremarkable, no induration, no erythema, no significant tender.   Discharge  Instructions You were cared for by a hospitalist during your hospital stay. If you have any questions about your discharge medications or the care you received while you were in the hospital after you are discharged, you can call the unit and asked to speak with the hospitalist on call if the hospitalist that took care of you is not available. Once you are discharged, your primary care physician will handle any further medical issues. Please note that NO REFILLS for any discharge medications will be authorized once you are discharged, as it is imperative that you return to your primary care physician (or establish a relationship with a primary care physician if you do not have one) for your aftercare needs so that they can reassess your need for medications and monitor your lab values.  Discharge Instructions    Diet - low sodium heart healthy    Complete by:  As directed      Increase activity slowly    Complete by:  As directed             Medication List    STOP taking these medications        canagliflozin 100 MG Tabs tablet  Commonly known as:  INVOKANA      TAKE these medications        acidophilus Caps capsule  Take 1 capsule by mouth daily.     amLODipine 10 MG tablet  Commonly known as:  NORVASC  Take 1 tablet (10 mg total) by mouth daily.     aspirin  81 MG EC tablet  Take 81 mg by mouth at bedtime.     atorvastatin 20 MG tablet  Commonly known as:  LIPITOR  Take 1 tablet (20 mg total) by mouth at bedtime.     clopidogrel 75 MG tablet  Commonly known as:  PLAVIX  Take 1 tablet (75 mg total) by mouth daily.     DAILY VITAMINS/IRON/BETA CAROT PO  Take 1 tablet by mouth at bedtime.     DRY EYES OP  Apply 1 drop to eye daily as needed. For dry eye/ red eye     EASY TOUCH INSULIN SYRINGE 31G X 5/16" 1 ML Misc  Generic drug:  Insulin Syringe-Needle U-100  USE AS DIRECTED.     esomeprazole 40 MG capsule  Commonly known as:  NEXIUM  Take 1 capsule (40 mg total) by  mouth daily as needed (FO ACID REFLUX/GERD).     furosemide 20 MG tablet  Commonly known as:  LASIX  Take 1 tablet (20 mg total) by mouth daily as needed for fluid or edema.     insulin NPH Human 100 UNIT/ML injection  Commonly known as:  HUMULIN N  Inject 65 units each morning with breakfast and 28 units each evening with supper.     isosorbide mononitrate 30 MG 24 hr tablet  Commonly known as:  IMDUR  Take 1 tablet (30 mg total) by mouth daily.     lisinopril 20 MG tablet  Commonly known as:  PRINIVIL,ZESTRIL  Take 1 tablet (20 mg total) by mouth daily.     LORazepam 0.5 MG tablet  Commonly known as:  ATIVAN  TAKE (1) TABLET TWICE DAILY.     meclizine 25 MG tablet  Commonly known as:  ANTIVERT  TAKE 1 TABLET TWICE DAILY AS NEEDED FOR DIZZINESS.     metFORMIN 500 MG 24 hr tablet  Commonly known as:  GLUCOPHAGE XR  Take 1 tablet with breakfast and 1 tablet with supper     metoprolol 50 MG tablet  Commonly known as:  LOPRESSOR  Take 1 tablet (50 mg total) by mouth 2 (two) times daily.     NITROSTAT 0.4 MG SL tablet  Generic drug:  nitroGLYCERIN  PLACE ONE (1) TABLET UNDER TONGUE EVERY 5 MINUTES UP TO (3) DOSES AS NEEDED FOR CHEST PAIN.     PARoxetine 20 MG tablet  Commonly known as:  PAXIL  Take 1 tablet (20 mg total) by mouth daily.     sulfamethoxazole-trimethoprim 800-160 MG per tablet  Commonly known as:  BACTRIM DS,SEPTRA DS  Take 1 tablet by mouth 2 (two) times daily.       Allergies  Allergen Reactions  . Heparin Other (See Comments)    Confusion  . Propoxyphene N-Acetaminophen Other (See Comments)    Numbness all over. "floating" sensation.       Follow-up Information    Follow up with Sharion Balloon, FNP In 1 week.   Specialty:  Nurse Practitioner   Contact information:   [place] [address] [phone]       Please follow up.   Why:  pmd to monitor blood pressure, repeat bmp in one week.       The results of  significant diagnostics from this hospitalization (including imaging, microbiology, ancillary and laboratory) are listed below for reference.    Significant Diagnostic Studies: Ct Head Wo Contrast  03/06/2015   CLINICAL DATA:  81 year old with confusion, nausea and vomiting today. Recent CVA.  History of anemia and stroke. Initial encounter.  EXAM: CT HEAD WITHOUT CONTRAST  TECHNIQUE: Contiguous axial images were obtained from the base of the skull through the vertex without intravenous contrast.  COMPARISON:  Head CT 08/27/2014.  FINDINGS: There is no evidence of acute intracranial hemorrhage, mass lesion, brain edema or extra-axial fluid collection. The ventricles and subarachnoid spaces are mildly prominent but stable. There are stable old infarcts in the right occipital lobe and inferiorly in the right cerebellum. No acute infarct identified. There is confluent periventricular white matter disease which is similar to the prior study. Extensive intracranial vascular calcifications noted.  Left maxillary sinus mucosal thickening noted. The additional visualized paranasal sinuses, mastoid air cells and middle ears are clear. Calvarial demineralization and scattered lucencies are stable.  IMPRESSION: 1. Stable chronic small vessel ischemic changes and old infarcts in the right occipital lobe and cerebellum. 2. No acute intracranial findings demonstrated.   Electronically Signed   By: Richardean Sale M.D.   On: 03/06/2015 18:59   Ct Abdomen Pelvis W Contrast  03/06/2015   CLINICAL DATA:  Sudden onset of vomiting 3 hours prior.  Chills.  EXAM: CT ABDOMEN AND PELVIS WITH CONTRAST  TECHNIQUE: Multidetector CT imaging of the abdomen and pelvis was performed using the standard protocol following bolus administration of intravenous contrast.  CONTRAST:  20mL OMNIPAQUE IOHEXOL 300 MG/ML SOLN, 67mL OMNIPAQUE IOHEXOL 300 MG/ML SOLN  COMPARISON:  08/27/2014  FINDINGS: The included lung bases are clear. There are old  right-sided rib fractures. The heart is mildly enlarged. Mitral annulus and coronary artery calcifications are seen.  Clips in the gallbladder fossa from cholecystectomy. Focal calcification the falciform ligament is unchanged. There is no focal hepatic lesion. No biliary dilatation. Spleen, adrenal glands, and pancreas are normal.  There is perinephric stranding about both kidneys, heterogeneous enhancement, right greater than left. No hydronephrosis, there is symmetric renal excretion. Small bilateral renal hypodensities bilaterally, too small to characterize, suspect cysts.  Stomach is minimally distended. There are no dilated or thickened bowel loops. The appendix not visualized, no pericecal inflammatory change. Equivocal colonic wall thickening involving the transverse colon versus nondistention, nondistention is favored given lack of surrounding inflammatory change. Small volume of stool throughout the ascending and descending colon. There distal colonic diverticular without diverticulitis. Scattered mesenteric calcifications may reflect granulomas in sequela of prior granulomatous disease versus phleboliths.  Bladder is physiologically distended. The uterus is surgically absent. The ovaries are not seen, no adnexal mass.  Abdominal aorta is normal in caliber moderate atherosclerosis. No retroperitoneal adenopathy. No free air, free fluid, or intra-abdominal fluid collection.  There are no acute or suspicious osseous abnormalities. Compression deformity of L3 is again seen, not significantly changed. There is multilevel degenerative change throughout spine.  IMPRESSION: 1. Perinephric stranding about both kidneys with question of heterogeneous enhancement on the right. Correlation with urinalysis recommended to exclude urinary tract infection. 2. Equivocal colonic wall thickening of the transverse colon versus nondistention, nondistention is favored given lack of surrounding inflammatory change.    Electronically Signed   By: Jeb Levering M.D.   On: 03/06/2015 02:57   Dg Chest Port 1 View  03/08/2015   CLINICAL DATA:  Respiratory failure .  EXAM: PORTABLE CHEST - 1 VIEW  COMPARISON:  03/07/2015 .  FINDINGS: Right PICC line noted at cavoatrial junction. Prior CABG. Cardiomegaly with pulmonary vascular prominence and interstitial prominence consistent with congestive heart failure. Interim slight clearing of pulmonary interstitial edema. No pleural effusion or pneumothorax.  IMPRESSION:  1. PICC line in stable position. 2. Prior CABG. Cardiomegaly. Interim slight clearing of pulmonary interstitial edema.   Electronically Signed   By: Marcello Moores  Register   On: 03/08/2015 07:53   Dg Chest Port 1 View  03/07/2015   CLINICAL DATA:  Subsequent evaluation fever shortness of breath aspiration  EXAM: PORTABLE CHEST - 1 VIEW  COMPARISON:  03/06/2015  FINDINGS: Mild to moderate cardiac enlargement stable. Vascular congestion. Mild to moderate interstitial prominence increased in conspicuity. Mild more focal infiltrate left lower lobe new from prior study as well. Mild blunting of the left costophrenic angle.  IMPRESSION: Congestive heart failure with interstitial pulmonary edema.  Mild asymmetric infiltrate left lower lobe could potentially represent developing pneumonitis. Radiographic followup recommended.   Electronically Signed   By: Skipper Cliche M.D.   On: 03/07/2015 11:14   Dg Chest Port 1 View  03/06/2015   CLINICAL DATA:  Fever.  EXAM: PORTABLE CHEST - 1 VIEW  COMPARISON:  Chest radiograph and chest CT, 08/27/2014.  FINDINGS: Changes from CABG surgery are stable. Cardiac silhouette is normal in size. No mediastinal or hilar masses. No lung consolidation or edema. No pleural effusion or pneumothorax.  Bony thorax is demineralized. Old healed left proximal humeral fracture is stable.  IMPRESSION: No acute cardiopulmonary disease.   Electronically Signed   By: Lajean Manes M.D.   On: 03/06/2015 13:11     Microbiology: Recent Results (from the past 240 hour(s))  Culture, blood (routine x 2)     Status: None   Collection Time: 03/06/15  1:45 PM  Result Value Ref Range Status   Specimen Description BLOOD LEFT ARM  Final   Special Requests BOTTLES DRAWN AEROBIC ONLY [place]  Final   Culture   Final    ESCHERICHIA COLI Note: SUSCEPTIBILITIES PERFORMED ON PREVIOUS CULTURE WITHIN THE LAST 5 DAYS. Note: Gram Stain Report Called to,Read Back By and Verified With: DEREK D 03/07/15 1355 BY SMITHERSJ Performed at [facility]    Report Status 03/09/2015 FINAL  Final  Culture, blood (routine x 2)     Status: None   Collection Time: 03/06/15  1:51 PM  Result Value Ref Range Status   Specimen Description BLOOD LEFT HAND  Final   Special Requests BOTTLES DRAWN AEROBIC ONLY 3.5CC  Final   Culture   Final    ESCHERICHIA COLI Note: Gram Stain Report Called to,Read Back By and Verified With: DERRICK RN ON 2W AT [id number] BY CASTC Performed at [facility]    Report Status 03/09/2015 FINAL  Final   Organism ID, Bacteria ESCHERICHIA COLI  Final      Susceptibility   Escherichia coli - MIC*    AMPICILLIN >=32 RESISTANT Resistant     AMPICILLIN/SULBACTAM 8 SENSITIVE Sensitive     CEFAZOLIN >=64 RESISTANT Resistant     CEFEPIME <=1 SENSITIVE Sensitive     CEFTAZIDIME <=1 SENSITIVE Sensitive     CEFTRIAXONE <=1 SENSITIVE Sensitive     CIPROFLOXACIN <=0.25 SENSITIVE Sensitive     GENTAMICIN <=1 SENSITIVE Sensitive     IMIPENEM <=0.25 SENSITIVE Sensitive     PIP/TAZO <=4 SENSITIVE Sensitive     TOBRAMYCIN <=1 SENSITIVE Sensitive     TRIMETH/SULFA <=20 SENSITIVE Sensitive     * ESCHERICHIA COLI  Culture, Urine     Status: None   Collection Time: 03/06/15  4:55 PM  Result Value Ref Range Status   Specimen Description URINE, CATHETERIZED  Final   Special Requests NONE  Final  Colony Count   Final    >=100,000 COLONIES/ML Performed at [facility]    Culture   Final     ESCHERICHIA COLI Performed at [facility]    Report Status 03/08/2015 FINAL  Final   Organism ID, Bacteria ESCHERICHIA COLI  Final      Susceptibility   Escherichia coli - MIC*    AMPICILLIN >=32 RESISTANT Resistant     CEFAZOLIN <=4 SENSITIVE Sensitive     CEFTRIAXONE <=1 SENSITIVE Sensitive     CIPROFLOXACIN <=0.25 SENSITIVE Sensitive     GENTAMICIN <=1 SENSITIVE Sensitive     LEVOFLOXACIN <=0.12 SENSITIVE Sensitive     NITROFURANTOIN <=16 SENSITIVE Sensitive     TOBRAMYCIN <=1 SENSITIVE Sensitive     TRIMETH/SULFA <=20 SENSITIVE Sensitive     PIP/TAZO <=4 SENSITIVE Sensitive     * ESCHERICHIA COLI  Culture, blood (routine x 2)     Status: None (Preliminary result)   Collection Time: 03/07/15 11:30 AM  Result Value Ref Range Status   Specimen Description BLOOD LEFT ARM  Final   Special Requests BOTTLES DRAWN AEROBIC ONLY 5CC  Final   Culture   Final           BLOOD CULTURE RECEIVED NO GROWTH TO DATE CULTURE WILL BE HELD FOR 5 DAYS BEFORE ISSUING A FINAL NEGATIVE REPORT Performed at [facility]    Report Status PENDING  Incomplete  MRSA PCR Screening     Status: None   Collection Time: 03/07/15  4:50 PM  Result Value Ref Range Status   MRSA by PCR NEGATIVE NEGATIVE Final    Comment:        The GeneXpert MRSA Assay (FDA approved for NASAL specimens only), is one component of a comprehensive MRSA colonization surveillance program. It is not intended to diagnose MRSA infection nor to guide or monitor treatment for MRSA infections.      Labs: Basic Metabolic Panel:  Recent Labs Lab 03/07/15 0400 03/08/15 0505 03/09/15 0412 03/10/15 0500 03/11/15 0507  NA 134* 139 137 134* 136  K 3.8 3.4* 3.7 3.4* 3.4*  CL 101 106 105 101 103  CO2 26 29 28 24 28   GLUCOSE 92 108* 176* 179* 184*  BUN 23 27* 30* 22 16  CREATININE 1.28* 1.38* 1.42* 1.19* 1.02  CALCIUM 8.8 8.8 9.1 8.9 8.9  MG  --  2.0  --   --  1.7   Liver Function Tests:  Recent Labs Lab  03/06/15 0406 03/07/15 0400 03/08/15 0505 03/09/15 0412 03/10/15 0500  AST 21 21 21 21 19   ALT 14 13 13 13 12   ALKPHOS 48 39 39 44 42  BILITOT 0.5 0.5 0.6 0.5 0.6  PROT 6.6 5.8* 5.5* 5.9* 5.5*  ALBUMIN 3.2* 2.6* 2.2* 2.2* 2.0*    Recent Labs Lab 03/06/15 0120  LIPASE 23   No results for input(s): AMMONIA in the last 168 hours. CBC:  Recent Labs Lab 03/07/15 0400 03/08/15 0505 03/09/15 0412 03/10/15 0500 03/11/15 0507  WBC 10.4 7.8 8.7 9.8 9.2  NEUTROABS 8.6* 7.0 7.3 7.8* 6.9  HGB 9.8* 9.5* 9.8* 9.7* 9.8*  HCT 30.9* 29.2* 30.3* 29.1* 29.9*  MCV 88.3 87.4 85.8 85.8 84.7  PLT 109* 75* 74* 100* 107*   Cardiac Enzymes:  Recent Labs Lab 03/06/15 0120 03/06/15 0406 03/06/15 1555 03/07/15 1735 03/11/15 0507  TROPONINI 0.44* 0.45* 0.35* 0.63* 0.42*   BNP: BNP (last 3 results) No results for input(s): BNP in the last 8760  hours.  ProBNP (last 3 results) No results for input(s): PROBNP in the last 8760 hours.  CBG:  Recent Labs Lab 03/10/15 1556 03/10/15 2012 03/10/15 2335 03/11/15 0505 03/11/15 0736  GLUCAP 250* 238* 183* 181* 166*       SignedFlorencia Reasons MD, PhD  [facility] 03/11/2015, 12:10 PM

## 2015-03-11 NOTE — Progress Notes (Signed)
Physical Therapy Treatment Patient Details Name: RAECHELLE FEURTADO MRN: [id number] DOB: 09/22/1931 Today's Date: 03-14-15    History of Present Illness Pt adm with elevated trop , likely demand ischemia. PMH - DM, CAD, chf    PT Comments    Pt making steady progress.  Follow Up Recommendations  No PT follow up (pt/family decline HHPT)     Equipment Recommendations  None recommended by PT    Recommendations for Other Services       Precautions / Restrictions Precautions Precautions: Fall Restrictions Weight Bearing Restrictions: No    Mobility  Bed Mobility                  Transfers Overall transfer level: Needs assistance Equipment used: Rolling walker (2 wheeled) Transfers: Sit to/from Stand Sit to Stand: Min assist         General transfer comment: Assist to bring hips up.  Ambulation/Gait Ambulation/Gait assistance: Min guard Ambulation Distance (Feet): 50 Feet Assistive device: Rolling walker (2 wheeled) Gait Pattern/deviations: Step-through pattern;Decreased step length - right;Decreased step length - left;Shuffle;Trunk flexed Gait velocity: decr   General Gait Details: Verbal cues to stay closer to walker and stay closer to walker.   Stairs            Wheelchair Mobility    Modified Rankin (Stroke Patients Only)       Balance Overall balance assessment: Needs assistance Sitting-balance support: No upper extremity supported;Feet supported Sitting balance-Leahy Scale: Good     Standing balance support: Bilateral upper extremity supported Standing balance-Leahy Scale: Poor Standing balance comment: support of walker                     Cognition Arousal/Alertness: Awake/alert Behavior During Therapy: WFL for tasks assessed/performed Overall Cognitive Status: Within Functional Limits for tasks assessed                      Exercises      General Comments        Pertinent Vitals/Pain Pain Assessment:  No/denies pain    Home Living                      Prior Function            PT Goals (current goals can now be found in the care plan section) Progress towards PT goals: Progressing toward goals    Frequency  Min 3X/week    PT Plan Current plan remains appropriate    Co-evaluation             End of Session Equipment Utilized During Treatment: Gait belt Activity Tolerance: Patient tolerated treatment well Patient left: in bed;with call bell/phone within reach;with family/visitor present (sitting EOB)     Time: [id number] PT Time Calculation (min) (ACUTE ONLY): 19 min  Charges:  $Gait Training: 8-22 mins                    G Codes:      Alletta Mattos Mar 14, 2015, 11:52 AM  Suanne Marker PT [phone]

## 2015-03-11 NOTE — Progress Notes (Signed)
Pt PICC line removed and discharge instructions were reviewed and given to caregiver. Pt is non tele and belongings were sent home with family.

## 2015-03-12 ENCOUNTER — Telehealth: Payer: Self-pay | Admitting: Family Medicine

## 2015-03-13 ENCOUNTER — Telehealth: Payer: Self-pay | Admitting: Family

## 2015-03-13 LAB — CULTURE, BLOOD (ROUTINE X 2): CULTURE: NO GROWTH

## 2015-03-13 NOTE — Telephone Encounter (Signed)
Ok. Pt to make follow up appt as needed

## 2015-03-13 NOTE — Telephone Encounter (Signed)
Appointment given for 4/14 with Sabra Heck

## 2015-03-19 DIAGNOSIS — E1142 Type 2 diabetes mellitus with diabetic polyneuropathy: Secondary | ICD-10-CM | POA: Diagnosis not present

## 2015-03-19 DIAGNOSIS — L84 Corns and callosities: Secondary | ICD-10-CM | POA: Diagnosis not present

## 2015-03-19 DIAGNOSIS — B351 Tinea unguium: Secondary | ICD-10-CM | POA: Diagnosis not present

## 2015-03-21 ENCOUNTER — Encounter: Payer: Self-pay | Admitting: Family Medicine

## 2015-03-21 ENCOUNTER — Ambulatory Visit (INDEPENDENT_AMBULATORY_CARE_PROVIDER_SITE_OTHER): Payer: Medicare Other | Admitting: Family Medicine

## 2015-03-21 VITALS — BP 131/60 | HR 66 | Temp 96.2°F | Ht 66.0 in | Wt 197.0 lb

## 2015-03-21 DIAGNOSIS — Z794 Long term (current) use of insulin: Secondary | ICD-10-CM

## 2015-03-21 DIAGNOSIS — I5031 Acute diastolic (congestive) heart failure: Secondary | ICD-10-CM | POA: Diagnosis not present

## 2015-03-21 DIAGNOSIS — Z09 Encounter for follow-up examination after completed treatment for conditions other than malignant neoplasm: Secondary | ICD-10-CM | POA: Diagnosis not present

## 2015-03-21 DIAGNOSIS — I10 Essential (primary) hypertension: Secondary | ICD-10-CM

## 2015-03-21 DIAGNOSIS — E119 Type 2 diabetes mellitus without complications: Secondary | ICD-10-CM

## 2015-03-21 LAB — POCT URINALYSIS DIPSTICK
Bilirubin, UA: NEGATIVE
Glucose, UA: NEGATIVE
KETONES UA: NEGATIVE
Leukocytes, UA: NEGATIVE
Nitrite, UA: NEGATIVE
PH UA: 5
PROTEIN UA: NEGATIVE
Spec Grav, UA: 1.025
Urobilinogen, UA: NEGATIVE

## 2015-03-21 LAB — POCT CBC
Granulocyte percent: 69.7 %G (ref 37–80)
HCT, POC: 28.6 % — AB (ref 37.7–47.9)
Hemoglobin: 9.2 g/dL — AB (ref 12.2–16.2)
Lymph, poc: 1.5 (ref 0.6–3.4)
MCH: 27.7 pg (ref 27–31.2)
MCHC: 32.1 g/dL (ref 31.8–35.4)
MCV: 86.1 fL (ref 80–97)
MPV: 8.1 fL (ref 0–99.8)
POC Granulocyte: 4.3 (ref 2–6.9)
POC LYMPH PERCENT: 23.6 %L (ref 10–50)
Platelet Count, POC: 418 10*3/uL (ref 142–424)
RBC: 3.31 M/uL — AB (ref 4.04–5.48)
RDW, POC: 15 %
WBC: 6.2 10*3/uL (ref 4.6–10.2)

## 2015-03-21 LAB — POCT UA - MICROSCOPIC ONLY
BACTERIA, U MICROSCOPIC: NEGATIVE
CASTS, UR, LPF, POC: NEGATIVE
CRYSTALS, UR, HPF, POC: NEGATIVE
Mucus, UA: NEGATIVE
WBC, Ur, HPF, POC: NEGATIVE

## 2015-03-21 NOTE — Progress Notes (Signed)
Subjective:    [name], female    DOB: Nov 07, 1931, 81 y.o.   MRN: [id number]  HPI Patient here today for a hospital follow up from [place]. She went for a kinney infection that went to her blood. She is accompanied today by her daughter.  Apparently treated for 5 days for urosepsis. Patient does not recall going to hospital and was probably delirious at the time of her admission. They had wanted to discharge her to nursing home for strengthening but daughter is working with her at home. She does get around with walker and wheelchair at home. Appetite has not picked up.     Patient Active Problem List   Diagnosis Date Noted  . Elevated troponin 03/06/2015  . Fever   . History of diabetes mellitus   . Left buttock pain   . Hyperlipidemia 01/11/2015  . GAD (generalized anxiety disorder) 01/11/2015  . Depression 01/11/2015  . GERD (gastroesophageal reflux disease) 01/11/2015  . Osteopenia 07/13/2014  . Obesity (BMI 30-39.9) 06/19/2014  . Carotid stenosis   . Anemia of chronic disease 12/25/2011  . Iron deficiency anemia 05/27/2011  . ACUTE DIASTOLIC HEART FAILURE 08/67/6195  . Type 2 diabetes mellitus with insulin therapy 10/08/2009  . DYSLIPIDEMIA 10/08/2009  . Essential hypertension 10/08/2009  . Coronary atherosclerosis 10/08/2009  . CAROTID STENOSIS 10/08/2009   Outpatient Encounter Prescriptions as of 03/21/2015  Medication Sig  . acidophilus (RISAQUAD) CAPS capsule Take 1 capsule by mouth daily.  Marland Kitchen amLODipine (NORVASC) 10 MG tablet Take 1 tablet (10 mg total) by mouth daily.  . Artificial Tear Ointment (DRY EYES OP) Apply 1 drop to eye daily as needed. For dry eye/ red eye   . aspirin 81 MG EC tablet Take 81 mg by mouth at bedtime.   Marland Kitchen atorvastatin (LIPITOR) 20 MG tablet Take 1 tablet (20 mg total) by mouth at bedtime.  . clopidogrel (PLAVIX) 75 MG tablet Take 1 tablet (75 mg total) by mouth daily.  Marland Kitchen EASY TOUCH INSULIN SYRINGE 31G X 5/16" 1 ML MISC USE  AS DIRECTED.  Marland Kitchen esomeprazole (NEXIUM) 40 MG capsule Take 1 capsule (40 mg total) by mouth daily as needed (FO ACID REFLUX/GERD).  . furosemide (LASIX) 20 MG tablet Take 1 tablet (20 mg total) by mouth daily as needed for fluid or edema.  . insulin NPH Human (HUMULIN N) 100 UNIT/ML injection Inject 15 units each morning with breakfast  . insulin regular (HUMULIN R) 100 units/mL injection Humulin R sliding scale: Blood sugar 120-150 3units                       151-200 4units                      201-250 7units                     251- 300 11units                     301- 350 15units                      351-400  20units                      > 400 call MD immdediately  . isosorbide mononitrate (IMDUR) 30 MG 24 hr tablet Take 1 tablet (30 mg total) by mouth daily.  Marland Kitchen lisinopril (PRINIVIL,ZESTRIL)  20 MG tablet Take 1 tablet (20 mg total) by mouth daily.  Marland Kitchen LORazepam (ATIVAN) 0.5 MG tablet TAKE (1) TABLET TWICE DAILY.  . meclizine (ANTIVERT) 25 MG tablet TAKE 1 TABLET TWICE DAILY AS NEEDED FOR DIZZINESS.  . metFORMIN (GLUCOPHAGE XR) 500 MG 24 hr tablet Take 1 tablet with breakfast and 1 tablet with supper  . metoprolol (LOPRESSOR) 50 MG tablet Take 1 tablet (50 mg total) by mouth 2 (two) times daily.  . Multiple Vitamins-Iron (DAILY VITAMINS/IRON/BETA CAROT PO) Take 1 tablet by mouth at bedtime.  Marland Kitchen NITROSTAT 0.4 MG SL tablet PLACE ONE (1) TABLET UNDER TONGUE EVERY 5 MINUTES UP TO (3) DOSES AS NEEDED FOR CHEST PAIN.  Marland Kitchen PARoxetine (PAXIL) 20 MG tablet Take 1 tablet (20 mg total) by mouth daily.  . [DISCONTINUED] sulfamethoxazole-trimethoprim (BACTRIM DS,SEPTRA DS) 800-160 MG per tablet Take 1 tablet by mouth 2 (two) times daily.    Review of Systems  HENT: Negative.   Eyes: Negative.   Respiratory: Negative.   Cardiovascular: Negative.   Gastrointestinal: Negative.   Endocrine: Negative.   Genitourinary: Negative.   Musculoskeletal: Positive for back pain (low back pain) and arthralgias  (bilateral knee arthritis. ).  Skin: Negative.   Allergic/Immunologic: Negative.   Neurological: Positive for weakness.  Hematological: Negative.   Psychiatric/Behavioral: Negative.        Objective:   Physical Exam  Constitutional: She appears well-developed and well-nourished.  Cardiovascular: Normal rate and regular rhythm.   Pulmonary/Chest: Effort normal and breath sounds normal.  Abdominal: Soft.  Psychiatric: She has a normal mood and affect. Her behavior is normal. Thought content normal.   BP 131/60 mmHg  Pulse 66  Temp(Src) 96.2 F (35.7 C) (Oral)  Ht _0  (1.676 m)  Wt 197 lb (89.359 kg)  BMI 31.81 kg/m2       Assessment & Plan:  1. Hospital discharge follow-up This was apparently the second episode of urosepsis. Daughter feels this is related to her incontinence and wearing a depends at night. Patient says she is continent in the daytime. - POCT CBC - BMP8+EGFR - POCT UA - Microscopic Only - POCT urinalysis dipstick  2. Type 2 diabetes mellitus with insulin therapy Even though she is not eating well yet, she is getting her regular doses of insulin. Cautioned daughter about hypoglycemia and monitoring her sugar at home  3. Essential hypertension Blood pressure within normal limits on lisinopril  4. Acute diastolic heart failure Symptomatic today. Problem followed and treated by cardiology  Wardell Honour MD

## 2015-03-22 LAB — BMP8+EGFR
BUN / CREAT RATIO: 11 (ref 11–26)
BUN: 15 mg/dL (ref 8–27)
CO2: 22 mmol/L (ref 18–29)
CREATININE: 1.39 mg/dL — AB (ref 0.57–1.00)
Calcium: 9.4 mg/dL (ref 8.7–10.3)
Chloride: 102 mmol/L (ref 97–108)
GFR calc non Af Amer: 36 mL/min/{1.73_m2} — ABNORMAL LOW (ref >59)
GFR, EST AFRICAN AMERICAN: 41 mL/min/{1.73_m2} — AB (ref >59)
Glucose: 278 mg/dL — ABNORMAL HIGH (ref 65–99)
Potassium: 5 mmol/L (ref 3.5–5.2)
SODIUM: 139 mmol/L (ref 134–144)

## 2015-03-25 ENCOUNTER — Encounter (HOSPITAL_COMMUNITY): Payer: Self-pay | Admitting: Emergency Medicine

## 2015-03-25 ENCOUNTER — Emergency Department (HOSPITAL_COMMUNITY)
Admission: EM | Admit: 2015-03-25 | Discharge: 2015-03-25 | Disposition: A | Discharge status: Home / Self Care | Payer: Medicare Other | Attending: Emergency Medicine | Admitting: Emergency Medicine

## 2015-03-25 ENCOUNTER — Emergency Department (HOSPITAL_COMMUNITY): Payer: Medicare Other

## 2015-03-25 DIAGNOSIS — I509 Heart failure, unspecified: Secondary | ICD-10-CM | POA: Insufficient documentation

## 2015-03-25 DIAGNOSIS — Z792 Long term (current) use of antibiotics: Secondary | ICD-10-CM | POA: Diagnosis not present

## 2015-03-25 DIAGNOSIS — Y9289 Other specified places as the place of occurrence of the external cause: Secondary | ICD-10-CM | POA: Diagnosis not present

## 2015-03-25 DIAGNOSIS — I2510 Atherosclerotic heart disease of native coronary artery without angina pectoris: Secondary | ICD-10-CM | POA: Insufficient documentation

## 2015-03-25 DIAGNOSIS — Y9389 Activity, other specified: Secondary | ICD-10-CM | POA: Diagnosis not present

## 2015-03-25 DIAGNOSIS — W1839XA Other fall on same level, initial encounter: Secondary | ICD-10-CM | POA: Insufficient documentation

## 2015-03-25 DIAGNOSIS — I10 Essential (primary) hypertension: Secondary | ICD-10-CM | POA: Insufficient documentation

## 2015-03-25 DIAGNOSIS — Y998 Other external cause status: Secondary | ICD-10-CM | POA: Insufficient documentation

## 2015-03-25 DIAGNOSIS — M1990 Unspecified osteoarthritis, unspecified site: Secondary | ICD-10-CM | POA: Insufficient documentation

## 2015-03-25 DIAGNOSIS — E11649 Type 2 diabetes mellitus with hypoglycemia without coma: Secondary | ICD-10-CM | POA: Insufficient documentation

## 2015-03-25 DIAGNOSIS — Z872 Personal history of diseases of the skin and subcutaneous tissue: Secondary | ICD-10-CM | POA: Insufficient documentation

## 2015-03-25 DIAGNOSIS — Z87448 Personal history of other diseases of urinary system: Secondary | ICD-10-CM | POA: Insufficient documentation

## 2015-03-25 DIAGNOSIS — Z951 Presence of aortocoronary bypass graft: Secondary | ICD-10-CM | POA: Insufficient documentation

## 2015-03-25 DIAGNOSIS — Z794 Long term (current) use of insulin: Secondary | ICD-10-CM | POA: Diagnosis not present

## 2015-03-25 DIAGNOSIS — W19XXXA Unspecified fall, initial encounter: Secondary | ICD-10-CM

## 2015-03-25 DIAGNOSIS — E162 Hypoglycemia, unspecified: Secondary | ICD-10-CM

## 2015-03-25 DIAGNOSIS — S8002XA Contusion of left knee, initial encounter: Secondary | ICD-10-CM | POA: Insufficient documentation

## 2015-03-25 DIAGNOSIS — Z862 Personal history of diseases of the blood and blood-forming organs and certain disorders involving the immune mechanism: Secondary | ICD-10-CM | POA: Diagnosis not present

## 2015-03-25 DIAGNOSIS — S8992XA Unspecified injury of left lower leg, initial encounter: Secondary | ICD-10-CM | POA: Diagnosis not present

## 2015-03-25 DIAGNOSIS — Z7902 Long term (current) use of antithrombotics/antiplatelets: Secondary | ICD-10-CM | POA: Insufficient documentation

## 2015-03-25 DIAGNOSIS — Z79899 Other long term (current) drug therapy: Secondary | ICD-10-CM | POA: Diagnosis not present

## 2015-03-25 DIAGNOSIS — Z7982 Long term (current) use of aspirin: Secondary | ICD-10-CM | POA: Insufficient documentation

## 2015-03-25 DIAGNOSIS — M25462 Effusion, left knee: Secondary | ICD-10-CM | POA: Diagnosis not present

## 2015-03-25 DIAGNOSIS — E876 Hypokalemia: Secondary | ICD-10-CM | POA: Diagnosis not present

## 2015-03-25 LAB — CBC WITH DIFFERENTIAL/PLATELET
BASOS PCT: 1 % (ref 0–1)
Basophils Absolute: 0 10*3/uL (ref 0.0–0.1)
EOS PCT: 1 % (ref 0–5)
Eosinophils Absolute: 0.1 10*3/uL (ref 0.0–0.7)
HCT: 29.3 % — ABNORMAL LOW (ref 36.0–46.0)
Hemoglobin: 9.4 g/dL — ABNORMAL LOW (ref 12.0–15.0)
LYMPHS PCT: 23 % (ref 12–46)
Lymphs Abs: 1.5 10*3/uL (ref 0.7–4.0)
MCH: 28.6 pg (ref 26.0–34.0)
MCHC: 32.1 g/dL (ref 30.0–36.0)
MCV: 89.1 fL (ref 78.0–100.0)
Monocytes Absolute: 0.6 10*3/uL (ref 0.1–1.0)
Monocytes Relative: 10 % (ref 3–12)
NEUTROS PCT: 65 % (ref 43–77)
Neutro Abs: 4.2 10*3/uL (ref 1.7–7.7)
PLATELETS: 251 10*3/uL (ref 150–400)
RBC: 3.29 MIL/uL — ABNORMAL LOW (ref 3.87–5.11)
RDW: 15.1 % (ref 11.5–15.5)
WBC: 6.5 10*3/uL (ref 4.0–10.5)

## 2015-03-25 LAB — CBG MONITORING, ED
GLUCOSE-CAPILLARY: 68 mg/dL — AB (ref 70–99)
GLUCOSE-CAPILLARY: 88 mg/dL (ref 70–99)
Glucose-Capillary: 45 mg/dL — ABNORMAL LOW (ref 70–99)

## 2015-03-25 LAB — BASIC METABOLIC PANEL
Anion gap: 8 (ref 5–15)
BUN: 20 mg/dL (ref 6–23)
CO2: 26 mmol/L (ref 19–32)
Calcium: 9.3 mg/dL (ref 8.4–10.5)
Chloride: 108 mmol/L (ref 96–112)
Creatinine, Ser: 1.3 mg/dL — ABNORMAL HIGH (ref 0.50–1.10)
GFR calc Af Amer: 43 mL/min — ABNORMAL LOW (ref >90)
GFR, EST NON AFRICAN AMERICAN: 37 mL/min — AB (ref >90)
GLUCOSE: 94 mg/dL (ref 70–99)
POTASSIUM: 4.1 mmol/L (ref 3.5–5.1)
Sodium: 142 mmol/L (ref 135–145)

## 2015-03-25 LAB — URINALYSIS, ROUTINE W REFLEX MICROSCOPIC
Bilirubin Urine: NEGATIVE
GLUCOSE, UA: NEGATIVE mg/dL
Leukocytes, UA: NEGATIVE
NITRITE: NEGATIVE
Protein, ur: 30 mg/dL — AB
SPECIFIC GRAVITY, URINE: 1.025 (ref 1.005–1.030)
Urobilinogen, UA: 0.2 mg/dL (ref 0.0–1.0)
pH: 5 (ref 5.0–8.0)

## 2015-03-25 LAB — URINE MICROSCOPIC-ADD ON

## 2015-03-25 NOTE — ED Notes (Signed)
Pt states that her knee gave out earlier today causing her to fall.  C/o injured right index finger and bilateral knee pain.  States she did not pass out or hit head.  Has elastic braces on both knees that have rolled up cutting off circulation to lower extremities.  These removed and 2+ pitting edema noted to lower legs and feet.  Pt states that she noticed they had been swelling more than usual since applying the bands.

## 2015-03-25 NOTE — Discharge Instructions (Signed)
Hypoglycemia °Hypoglycemia occurs when the glucose in your blood is too low. Glucose is a type of sugar that is your body's main energy source. Hormones, such as insulin and glucagon, control the level of glucose in the blood. Insulin lowers blood glucose and glucagon increases blood glucose. Having too much insulin in your blood stream, or not eating enough food containing sugar, can result in hypoglycemia. Hypoglycemia can happen to people with or without diabetes. It can develop quickly and can be a medical emergency.  °CAUSES  °· Missing or delaying meals. °· Not eating enough carbohydrates at meals. °· Taking too much diabetes medicine. °· Not timing your oral diabetes medicine or insulin doses with meals, snacks, and exercise. °· Nausea and vomiting. °· Certain medicines. °· Severe illnesses, such as hepatitis, kidney disorders, and certain eating disorders. °· Increased activity or exercise without eating something extra or adjusting medicines. °· Drinking too much alcohol. °· A nerve disorder that affects body functions like your heart rate, blood pressure, and digestion (autonomic neuropathy). °· A condition where the stomach muscles do not function properly (gastroparesis). Therefore, medicines and food may not absorb properly. °· Rarely, a tumor of the pancreas can produce too much insulin. °SYMPTOMS  °· Hunger. °· Sweating (diaphoresis). °· Change in body temperature. °· Shakiness. °· Headache. °· Anxiety. °· Lightheadedness. °· Irritability. °· Difficulty concentrating. °· Dry mouth. °· Tingling or numbness in the hands or feet. °· Restless sleep or sleep disturbances. °· Altered speech and coordination. °· Change in mental status. °· Seizures or prolonged convulsions. °· Combativeness. °· Drowsiness (lethargic). °· Weakness. °· Increased heart rate or palpitations. °· Confusion. °· Pale, gray skin color. °· Blurred or double vision. °· Fainting. °DIAGNOSIS  °A physical exam and medical history will be  performed. Your caregiver may make a diagnosis based on your symptoms. Blood tests and other lab tests may be performed to confirm a diagnosis. Once the diagnosis is made, your caregiver will see if your signs and symptoms go away once your blood glucose is raised.  °TREATMENT  °Usually, you can easily treat your hypoglycemia when you notice symptoms. °· Check your blood glucose. If it is less than 70 mg/dl, take one of the following:   °¨ 3-4 glucose tablets.   °¨ ½ cup juice.   °¨ ½ cup regular soda.   °¨ 1 cup skim milk.   °¨ ½-1 tube of glucose gel.   °¨ 5-6 hard candies.   °· Avoid high-fat drinks or food that may delay a rise in blood glucose levels. °· Do not take more than the recommended amount of sugary foods, drinks, gel, or tablets. Doing so will cause your blood glucose to go too high.   °· Wait 10-15 minutes and recheck your blood glucose. If it is still less than 70 mg/dl or below your target range, repeat treatment.   °· Eat a snack if it is more than 1 hour until your next meal.   °There may be a time when your blood glucose may go so low that you are unable to treat yourself at home when you start to notice symptoms. You may need someone to help you. You may even faint or be unable to swallow. If you cannot treat yourself, someone will need to bring you to the hospital.  °HOME CARE INSTRUCTIONS °· If you have diabetes, follow your diabetes management plan by: °¨ Taking your medicines as directed. °¨ Following your exercise plan. °¨ Following your meal plan. Do not skip meals. Eat on time. °¨ Testing your blood   glucose regularly. Check your blood glucose before and after exercise. If you exercise longer or different than usual, be sure to check blood glucose more frequently. °¨ Wearing your medical alert jewelry that says you have diabetes. °· Identify the cause of your hypoglycemia. Then, develop ways to prevent the recurrence of hypoglycemia. °· Do not take a hot bath or shower right after an  insulin shot. °· Always carry treatment with you. Glucose tablets are the easiest to carry. °· If you are going to drink alcohol, drink it only with meals. °· Tell friends or family members ways to keep you safe during a seizure. This may include removing hard or sharp objects from the area or turning you on your side. °· Maintain a healthy weight. °SEEK MEDICAL CARE IF:  °· You are having problems keeping your blood glucose in your target range. °· You are having frequent episodes of hypoglycemia. °· You feel you might be having side effects from your medicines. °· You are not sure why your blood glucose is dropping so low. °· You notice a change in vision or a new problem with your vision. °SEEK IMMEDIATE MEDICAL CARE IF:  °· Confusion develops. °· A change in mental status occurs. °· The inability to swallow develops. °· Fainting occurs. °Document Released: 11/23/2005 Document Revised: 11/28/2013 Document Reviewed: 03/21/2012 °[employer]® Patient Information ©2015 [employer]. This information is not intended to replace advice given to you by your health care provider. Make sure you discuss any questions you have with your health care provider. ° °

## 2015-03-25 NOTE — ED Notes (Signed)
Family reports 2 falls today. Pt with injury to her R small finger, laceration. Bleeding controlled at present. Pt c/o knees being weak. Pt in hospital 2 wekks ago for Urinary sepsis.

## 2015-03-25 NOTE — ED Provider Notes (Signed)
CSN: [id number]     Arrival date & time 03/25/15  1640 History   First MD Initiated Contact with Patient 03/25/15 1807     Chief Complaint  Patient presents with  . Fall     (Consider location/radiation/quality/duration/timing/severity/associated sxs/prior Treatment) Patient is a 81 y.o. female presenting with fall. The history is provided by the patient.  Fall Pertinent negatives include no chest pain and no abdominal pain.   patient had her legs gave out and she fell. Complaining of some pain in both her knees and her right finger. Laceration to her finger. No headache. States she did not pass out. No chest pain. No Abdominal pain. She is somewhat unsteady. She is usually with a walker but had been walking with a cane. No confusion. No fevers.  Past Medical History  Diagnosis Date  . Anemia   . Diabetes mellitus   . Hypertension   . Iron deficiency anemia 05/27/2011  . CHF (congestive heart failure)     EF preserved Echo 2012  . CVA (cerebral infarction)   . Carotid stenosis   . Renal insufficiency   . Furuncle of back, except buttock   . Vertigo   . CAD (coronary artery disease)   . Arthritis    Past Surgical History  Procedure Laterality Date  . Cholecystectomy    . I & d of furuncle  April 2013  . Total abdominal hysterectomy      complete  . Coronary artery bypass graft  2006   Family History  Problem Relation Age of Onset  . Cancer Brother     porstate  . Early death Sister   . Heart disease Brother   . Heart disease Brother   . Heart attack Brother   . Heart disease Brother   . Heart attack Brother   . Diabetes Sister   . Heart attack Sister   . Diabetes Sister   . Osteoporosis Sister   . Hypertension Sister    History  Substance Use Topics  . Smoking status: Never Smoker   . Smokeless tobacco: Never Used  . Alcohol Use: No   OB History    Gravida Para Term Preterm AB TAB SAB Ectopic Multiple Living   4 4 4       3      Review of Systems   Constitutional: Negative for activity change and appetite change.  Respiratory: Negative for chest tightness.   Cardiovascular: Negative for chest pain.  Gastrointestinal: Negative for abdominal pain.  Musculoskeletal:       Bilateral hip and knee pain  Skin: Positive for wound.      Allergies  Heparin and Propoxyphene n-acetaminophen  Home Medications   Prior to Admission medications   Medication Sig Start Date End Date Taking? Authorizing Provider  amLODipine (NORVASC) 10 MG tablet Take 1 tablet (10 mg total) by mouth daily. 09/20/14  Yes Lysbeth Penner, FNP  Artificial Tear Ointment (DRY EYES OP) Apply 1 drop to eye daily as needed. For dry eye/ red eye    Yes Historical Provider, MD  aspirin 81 MG EC tablet Take 81 mg by mouth at bedtime.    Yes Historical Provider, MD  atorvastatin (LIPITOR) 20 MG tablet Take 1 tablet (20 mg total) by mouth at bedtime. 09/20/14  Yes Lysbeth Penner, FNP  canagliflozin (INVOKANA) 300 MG TABS tablet Take 300 mg by mouth daily before breakfast.   Yes Historical Provider, MD  clopidogrel (PLAVIX) 75 MG tablet Take 1 tablet (75  mg total) by mouth daily. 09/20/14  Yes Lysbeth Penner, FNP  esomeprazole (NEXIUM) 40 MG capsule Take 1 capsule (40 mg total) by mouth daily as needed (FO ACID REFLUX/GERD). 09/20/14  Yes Lysbeth Penner, FNP  furosemide (LASIX) 20 MG tablet Take 1 tablet (20 mg total) by mouth daily as needed for fluid or edema. 09/20/14  Yes Lysbeth Penner, FNP  insulin NPH Human (HUMULIN N) 100 UNIT/ML injection Inject 15 units each morning with breakfast Patient taking differently: Inject 63 Units into the skin daily with breakfast.  03/11/15  Yes Florencia Reasons, MD  insulin regular (HUMULIN R) 100 units/mL injection Humulin R sliding scale: Blood sugar 120-150 3units                       151-200 4units                      201-250 7units                     251- 300 11units                     301- 350 15units                       351-400  20units                      > 400 call MD immdediately Patient taking differently: Inject 3-20 Units into the skin 3 (three) times daily as needed (SLIDING SCALE LISTED). Humulin R sliding scale: Blood sugar 120-150 3units                       151-200 4units                      201-250 7units                     251- 300 11units                     301- 350 15units                      351-400  20units                      > 400 call MD immdediately 03/11/15  Yes Florencia Reasons, MD  isosorbide mononitrate (IMDUR) 30 MG 24 hr tablet Take 1 tablet (30 mg total) by mouth daily. 03/11/15  Yes Florencia Reasons, MD  lisinopril (PRINIVIL,ZESTRIL) 20 MG tablet Take 1 tablet (20 mg total) by mouth daily. 01/28/15  Yes Sharion Balloon, FNP  LORazepam (ATIVAN) 0.5 MG tablet TAKE (1) TABLET TWICE DAILY. Patient taking differently: Take 0.5 mg by mouth 2 (two) times daily. TAKE (1) TABLET TWICE DAILY. 02/04/15  Yes Sharion Balloon, FNP  meclizine (ANTIVERT) 25 MG tablet TAKE 1 TABLET TWICE DAILY AS NEEDED FOR DIZZINESS. Patient taking differently: TAKE 1 TABLET ONCE EVERY DAY IN THE MORNING 03/06/15  Yes Sharion Balloon, FNP  metFORMIN (GLUCOPHAGE XR) 500 MG 24 hr tablet Take 1 tablet with breakfast and 1 tablet with supper 09/20/14  Yes Lysbeth Penner, FNP  metoprolol (LOPRESSOR) 50 MG tablet Take 1 tablet (50 mg total) by mouth 2 (two) times daily. 09/20/14  Yes Gwyndolyn Saxon  Donette Larry, FNP  Multiple Vitamins-Iron (DAILY VITAMINS/IRON/BETA CAROT PO) Take 1 tablet by mouth at bedtime.   Yes Historical Provider, MD  NITROSTAT 0.4 MG SL tablet PLACE ONE (1) TABLET UNDER TONGUE EVERY 5 MINUTES UP TO (3) DOSES AS NEEDED FOR CHEST PAIN. 10/10/14  Yes Lysbeth Penner, FNP  PARoxetine (PAXIL) 20 MG tablet Take 1 tablet (20 mg total) by mouth daily. 09/20/14  Yes Lysbeth Penner, FNP  acidophilus (RISAQUAD) CAPS capsule Take 1 capsule by mouth daily. 03/11/15   Florencia Reasons, MD  cephALEXin (KEFLEX) 500 MG capsule Take 1 capsule (500 mg  total) by mouth 3 (three) times daily. 03/27/15   Mary-Margaret Hassell Done, FNP  EASY TOUCH INSULIN SYRINGE 31G X 5/16" 1 ML MISC USE AS DIRECTED. 02/07/15   Chipper Herb, MD   BP 140/61 mmHg  Pulse 70  Temp(Src) 98.2 F (36.8 C) (Oral)  Resp 18  Ht 5\' 6"  (1.676 m)  Wt 197 lb (89.359 kg)  BMI 31.81 kg/m2  SpO2 96% Physical Exam  Constitutional: She appears well-developed and well-nourished.  HENT:  Head: Normocephalic and atraumatic.  Eyes: Pupils are equal, round, and reactive to light.  Cardiovascular: Normal rate.   Pulmonary/Chest: Effort normal and breath sounds normal.  Abdominal: Soft. There is no tenderness.  Musculoskeletal:  Ecchymosis and tenderness to left knee medially and inferiorly. Good range of motion in bilateral knees and hips. Neurovascular intact over both feet. There is a flap laceration on the palmar aspect of the  middle phalanx of the right little finger. Approximately 1.5 cm. Tendon is not visualized. It is overall somewhat superficial. There is also a smaller laceration over the proximal phalanx.   Skin:  Has a slightly erythematous fluctuant 1.5 cm red area on the right mid back.    ED Course  Procedures (including critical care time) Labs Review Labs Reviewed  CBC WITH DIFFERENTIAL/PLATELET - Abnormal; Notable for the following:    RBC 3.29 (*)    Hemoglobin 9.4 (*)    HCT 29.3 (*)    All other components within normal limits  BASIC METABOLIC PANEL - Abnormal; Notable for the following:    Creatinine, Ser 1.30 (*)    GFR calc non Af Amer 37 (*)    GFR calc Af Amer 43 (*)    All other components within normal limits  URINALYSIS, ROUTINE W REFLEX MICROSCOPIC - Abnormal; Notable for the following:    Hgb urine dipstick TRACE (*)    Ketones, ur TRACE (*)    Protein, ur 30 (*)    All other components within normal limits  URINE MICROSCOPIC-ADD ON - Abnormal; Notable for the following:    Squamous Epithelial / LPF MANY (*)    Bacteria, UA FEW (*)     All other components within normal limits  CBG MONITORING, ED - Abnormal; Notable for the following:    Glucose-Capillary 45 (*)    All other components within normal limits  CBG MONITORING, ED - Abnormal; Notable for the following:    Glucose-Capillary 68 (*)    All other components within normal limits  CBG MONITORING, ED    Imaging Review Dg Knee Complete 4 Views Left  03/25/2015   CLINICAL DATA:  Fall  EXAM: LEFT KNEE - COMPLETE 4+ VIEW  COMPARISON:  None.  FINDINGS: Severe tricompartment osteoarthritic change. Moderate joint effusion. Osteopenia. No acute fracture. No dislocation.  IMPRESSION: No acute bony pathology.  Chronic change.  Joint effusion.   Electronically Signed  By: Marybelle Killings M.D.   On: 03/25/2015 20:25     EKG Interpretation None      MDM   Final diagnoses:  Fall, initial encounter  Knee contusion, left, initial encounter  Hypoglycemia    Patient with fall. Appears be mechanical. Finger avulsion does not appear to be suturable. Wound care done. X-ray of knee is stable. Did have hypoglycemia while in ER. Patient reportedly not eaten breakfast. Sugar had maintained a higher level after eating without other intervention. Discharge home.    Davonna Belling, MD 03/27/15 [id number]

## 2015-03-25 NOTE — ED Notes (Signed)
When splint applied to finger, noted pt was sweating and pale.  Glucose checked and found to be low.  Pt given peanut butter crackers and coke and states she feels much better.  Food tray being obtained for pt.

## 2015-03-27 ENCOUNTER — Telehealth: Payer: Self-pay | Admitting: Nurse Practitioner

## 2015-03-27 ENCOUNTER — Ambulatory Visit (INDEPENDENT_AMBULATORY_CARE_PROVIDER_SITE_OTHER): Payer: Medicare Other | Admitting: Nurse Practitioner

## 2015-03-27 ENCOUNTER — Encounter: Payer: Self-pay | Admitting: Nurse Practitioner

## 2015-03-27 VITALS — BP 130/67 | HR 63 | Temp 97.0°F | Ht 66.0 in

## 2015-03-27 DIAGNOSIS — L723 Sebaceous cyst: Secondary | ICD-10-CM

## 2015-03-27 DIAGNOSIS — M25562 Pain in left knee: Secondary | ICD-10-CM | POA: Diagnosis not present

## 2015-03-27 DIAGNOSIS — L089 Local infection of the skin and subcutaneous tissue, unspecified: Secondary | ICD-10-CM

## 2015-03-27 DIAGNOSIS — S61209D Unspecified open wound of unspecified finger without damage to nail, subsequent encounter: Secondary | ICD-10-CM

## 2015-03-27 DIAGNOSIS — S61219D Laceration without foreign body of unspecified finger without damage to nail, subsequent encounter: Secondary | ICD-10-CM

## 2015-03-27 MED ORDER — CEPHALEXIN 500 MG PO CAPS: 500.0000 mg | ORAL_CAPSULE | Freq: Three times a day (TID) | ORAL | Status: DC

## 2015-03-27 NOTE — Progress Notes (Signed)
   Subjective:    [name], female    DOB: Jun 02, 1931, 81 y.o.   MRN: [id number]  HPI  Patient fell Monday at home and her daughter took her to the hospital- found no fracture. Since then her knee has been sore to walk on with slight swelling. Has a bruise on it where she hit it when she fell.  * Patient has a sebaceous cyst on back that needs to be lanced.- Pops up every year.   Review of Systems  Constitutional: Negative.   HENT: Negative.   Respiratory: Negative.   Cardiovascular: Negative.   Genitourinary: Negative.   Neurological: Negative.   Psychiatric/Behavioral: Negative.   All other systems reviewed and are negative.      Objective:   Physical Exam  Constitutional: She is oriented to person, place, and time. She appears well-developed and well-nourished.  Cardiovascular: Normal rate, regular rhythm and normal heart sounds.   Musculoskeletal:  Left knee effusion with pain on palpation. Pain with flexion and extension.  Neurological: She is alert and oriented to person, place, and time. She has normal reflexes.  Skin: Skin is warm and dry.  Erythematous sebaceous cyst mid  Lower back  Psychiatric: She has a normal mood and affect. Her behavior is normal. Judgment and thought content normal.   BP 130/67 mmHg  Pulse 63  Temp(Src) 97 F (36.1 C) (Oral)  Ht 5\' 6"  (1.676 m)  Wt    Procedure: Lidocain 2% with epi 1cc local in cystic area  Cleaned with betadine  #15 bldae- small amount of yellowish exudate exsanguinated from area  Dressing applied  Procedure- wound debridement right 5th finger     Assessment & Plan:  1. Left knee pain Ice and elevate until sees specialist - Ambulatory referral to Orthopedic Surgery  2. Infected sebaceous cyst Keep clean and dry - cephALEXin (KEFLEX) 500 MG capsule; Take 1 capsule (500 mg total) by mouth 3 (three) times daily.  Dispense: 30 capsule; Refill: 0  3. Laceration of skin of finger, subsequent  encounter Keep clean and dry RTO prn  Mary-Margaret Hassell Done, FNP

## 2015-03-27 NOTE — Patient Instructions (Signed)
Dressing Change °A dressing is a material placed over wounds. It keeps the wound clean, dry, and protected from further injury. This provides an environment that favors wound healing.  °BEFORE YOU BEGIN °· Get your supplies together. Things you may need include: °¨ Saline solution. °¨ Flexible gauze dressing. °¨ Medicated cream. °¨ Tape. °¨ Gloves. °¨ Abdominal dressing pads. °¨ Gauze squares. °¨ Plastic bags. °· Take pain medicine 30 minutes before the dressing change if you need it. °· Take a shower before you do the first dressing change of the day. Use plastic wrap or a plastic bag to prevent the dressing from getting wet. °REMOVING YOUR OLD DRESSING  °· Wash your hands with soap and water. Dry your hands with a clean towel. °· Put on your gloves. °· Remove any tape. °· Carefully remove the old dressing. If the dressing sticks, you may dampen it with warm water to loosen it, or follow your caregiver's specific directions. °· Remove any gauze or packing tape that is in your wound. °· Take off your gloves. °· Put the gloves, tape, gauze, or any packing tape into a plastic bag. °CHANGING YOUR DRESSING °· Open the supplies. °· Take the cap off the saline solution. °· Open the gauze package so that the gauze remains on the inside of the package. °· Put on your gloves. °· Clean your wound as told by your caregiver. °· If you have been told to keep your wound dry, follow those instructions. °· Your caregiver may tell you to do one or more of the following: °¨ Pick up the gauze. Pour the saline solution over the gauze. Squeeze out the extra saline solution. °¨ Put medicated cream or other medicine on your wound if you have been told to do so. °¨ Put the solution soaked gauze only in your wound, not on the skin around it. °¨ Pack your wound loosely or as told by your caregiver. °¨ Put dry gauze on your wound. °¨ Put abdominal dressing pads over the dry gauze if your wet gauze soaks through. °· Tape the abdominal dressing  pads in place so they will not fall off. Do not wrap the tape completely around the affected part (arm, leg, abdomen). °· Wrap the dressing pads with a flexible gauze dressing to secure it in place. °· Take off your gloves. Put them in the plastic bag with the old dressing. Tie the bag shut and throw it away. °· Keep the dressing clean and dry until your next dressing change. °· Wash your hands. °SEEK MEDICAL CARE IF: °· Your skin around the wound looks red. °· Your wound feels more tender or sore. °· You see pus in the wound. °· Your wound smells bad. °· You have a fever. °· Your skin around the wound has a rash that itches and burns. °· You see black or yellow skin in your wound that was not there before. °· You feel nauseous, throw up, and feel very tired. °Document Released: 12/31/2004 Document Revised: 02/15/2012 Document Reviewed: 10/05/2011 °Exit[employer]® Patient Information ©2015 [employer]. This information is not intended to replace advice given to you by your health care provider. Make sure you discuss any questions you have with your health care provider. ° °

## 2015-04-04 ENCOUNTER — Other Ambulatory Visit: Payer: Self-pay | Admitting: Family Medicine

## 2015-04-08 ENCOUNTER — Other Ambulatory Visit: Payer: Self-pay

## 2015-04-08 MED ORDER — LORAZEPAM 0.5 MG PO TABS: ORAL_TABLET | ORAL | Status: DC

## 2015-04-08 NOTE — Telephone Encounter (Signed)
rx called into pharmacy

## 2015-04-08 NOTE — Telephone Encounter (Signed)
Last seen 03/27/15 MMM If approved route to nurse to call into [facility]

## 2015-04-08 NOTE — Telephone Encounter (Signed)
Please call in ativan with 1 refills 

## 2015-04-11 DIAGNOSIS — Z6827 Body mass index (BMI) 27.0-27.9, adult: Secondary | ICD-10-CM | POA: Diagnosis not present

## 2015-04-11 DIAGNOSIS — M1712 Unilateral primary osteoarthritis, left knee: Secondary | ICD-10-CM | POA: Diagnosis not present

## 2015-04-11 DIAGNOSIS — M25562 Pain in left knee: Secondary | ICD-10-CM | POA: Diagnosis not present

## 2015-04-11 DIAGNOSIS — I10 Essential (primary) hypertension: Secondary | ICD-10-CM | POA: Diagnosis not present

## 2015-04-12 ENCOUNTER — Ambulatory Visit: Payer: Medicare Other | Admitting: Family

## 2015-04-16 ENCOUNTER — Ambulatory Visit (INDEPENDENT_AMBULATORY_CARE_PROVIDER_SITE_OTHER): Payer: Medicare Other | Admitting: Family Medicine

## 2015-04-16 ENCOUNTER — Encounter: Payer: Self-pay | Admitting: Family Medicine

## 2015-04-16 ENCOUNTER — Other Ambulatory Visit: Payer: Self-pay | Admitting: *Deleted

## 2015-04-16 VITALS — BP 134/74 | HR 64 | Temp 97.1°F | Ht 66.0 in | Wt 185.8 lb

## 2015-04-16 DIAGNOSIS — R32 Unspecified urinary incontinence: Secondary | ICD-10-CM

## 2015-04-16 DIAGNOSIS — E785 Hyperlipidemia, unspecified: Secondary | ICD-10-CM | POA: Diagnosis not present

## 2015-04-16 DIAGNOSIS — E119 Type 2 diabetes mellitus without complications: Secondary | ICD-10-CM

## 2015-04-16 DIAGNOSIS — Z794 Long term (current) use of insulin: Secondary | ICD-10-CM

## 2015-04-16 LAB — POCT UA - MICROSCOPIC ONLY
CRYSTALS, UR, HPF, POC: NEGATIVE
Casts, Ur, LPF, POC: NEGATIVE
Epithelial cells, urine per micros: NEGATIVE
Yeast, UA: NEGATIVE

## 2015-04-16 LAB — POCT URINALYSIS DIPSTICK
Glucose, UA: NEGATIVE
Ketones, UA: NEGATIVE
NITRITE UA: NEGATIVE
PH UA: 8.5
Spec Grav, UA: <=1.005
Urobilinogen, UA: NEGATIVE

## 2015-04-16 NOTE — Progress Notes (Signed)
Subjective:    Patient ID: [name], female    DOB: 1930/12/09, 81 y.o.   MRN: [id number]  HPI 81 year old female here to follow-up in diabetes, urinary incontinence with impaction, hypertension, and degenerative arthritis. Since I saw her last she had a fall which exacerbated her left knee pain. She was [place] orthopedist in [place] who is sending her to orthopedist in [place] for consideration of possible total knee replacement. I think this could be problematic given her age. She also has some dementia.  Her daughter helps manage her diabetes. Currently she is on insulin and metformin but does not use Invokana since she already has issues with frequent urination  Patient Active Problem List   Diagnosis Date Noted  . Elevated troponin 03/06/2015  . Fever   . History of diabetes mellitus   . Left buttock pain   . Hyperlipidemia 01/11/2015  . GAD (generalized anxiety disorder) 01/11/2015  . Depression 01/11/2015  . GERD (gastroesophageal reflux disease) 01/11/2015  . Osteopenia 07/13/2014  . Obesity (BMI 30-39.9) 06/19/2014  . Carotid stenosis   . Anemia of chronic disease 12/25/2011  . Iron deficiency anemia 05/27/2011  . ACUTE DIASTOLIC HEART FAILURE 123456  . Type 2 diabetes mellitus with insulin therapy 10/08/2009  . DYSLIPIDEMIA 10/08/2009  . Essential hypertension 10/08/2009  . Coronary atherosclerosis 10/08/2009  . CAROTID STENOSIS 10/08/2009   Outpatient Encounter Prescriptions as of 04/16/2015  Medication Sig  . acidophilus (RISAQUAD) CAPS capsule Take 1 capsule by mouth daily.  Marland Kitchen amLODipine (NORVASC) 10 MG tablet Take 1 tablet (10 mg total) by mouth daily.  . Artificial Tear Ointment (DRY EYES OP) Apply 1 drop to eye daily as needed. For dry eye/ red eye   . aspirin 81 MG EC tablet Take 81 mg by mouth at bedtime.   Marland Kitchen atorvastatin (LIPITOR) 20 MG tablet Take 1 tablet (20 mg total) by mouth at bedtime.  Marland Kitchen atorvastatin (LIPITOR) 20 MG tablet TAKE (1)  TABLET BY MOUTH ONCE DAILY.  . canagliflozin (INVOKANA) 300 MG TABS tablet Take 300 mg by mouth daily before breakfast.  . cephALEXin (KEFLEX) 250 MG capsule Take 1 capsule by mouth 3 (three) times daily.  . cephALEXin (KEFLEX) 500 MG capsule Take 1 capsule (500 mg total) by mouth 3 (three) times daily.  . clopidogrel (PLAVIX) 75 MG tablet Take 1 tablet (75 mg total) by mouth daily.  Marland Kitchen EASY TOUCH INSULIN SYRINGE 31G X 5/16" 1 ML MISC USE AS DIRECTED.  Marland Kitchen esomeprazole (NEXIUM) 40 MG capsule Take 1 capsule (40 mg total) by mouth daily as needed (FO ACID REFLUX/GERD).  . furosemide (LASIX) 20 MG tablet Take 1 tablet (20 mg total) by mouth daily as needed for fluid or edema.  . Ibuprofen 200 MG CAPS Take 2 capsules by mouth 2 (two) times daily.  . insulin NPH Human (HUMULIN N) 100 UNIT/ML injection Inject 15 units each morning with breakfast (Patient taking differently: Inject 63 Units into the skin daily with breakfast. )  . insulin regular (HUMULIN R) 100 units/mL injection Humulin R sliding scale: Blood sugar 120-150 3units                       151-200 4units                      201-250 7units                     251- 300 11units  301- 350 15units                      351-400  20units                      > 400 call MD immdediately (Patient taking differently: Inject 3-20 Units into the skin 3 (three) times daily as needed (SLIDING SCALE LISTED). Humulin R sliding scale: Blood sugar 120-150 3units                       151-200 4units                      201-250 7units                     251- 300 11units                     301- 350 15units                      351-400  20units                      > 400 call MD immdediately)  . isosorbide dinitrate (ISORDIL) 30 MG tablet Take 30 mg by mouth daily.  . isosorbide mononitrate (IMDUR) 30 MG 24 hr tablet Take 1 tablet (30 mg total) by mouth daily.  Marland Kitchen lisinopril (PRINIVIL,ZESTRIL) 20 MG tablet Take 1 tablet (20 mg total) by  mouth daily.  Marland Kitchen LORazepam (ATIVAN) 0.5 MG tablet TAKE (1) TABLET TWICE DAILY.  . meclizine (ANTIVERT) 25 MG tablet TAKE 1 TABLET TWICE DAILY AS NEEDED FOR DIZZINESS. (Patient taking differently: TAKE 1 TABLET ONCE EVERY DAY IN THE MORNING)  . metFORMIN (GLUCOPHAGE XR) 500 MG 24 hr tablet Take 1 tablet with breakfast and 1 tablet with supper  . metoprolol (LOPRESSOR) 50 MG tablet Take 1 tablet (50 mg total) by mouth 2 (two) times daily.  . Multiple Vitamins-Iron (DAILY VITAMINS/IRON/BETA CAROT PO) Take 1 tablet by mouth at bedtime.  Marland Kitchen NITROSTAT 0.4 MG SL tablet PLACE ONE (1) TABLET UNDER TONGUE EVERY 5 MINUTES UP TO (3) DOSES AS NEEDED FOR CHEST PAIN.  Marland Kitchen PARoxetine (PAXIL) 20 MG tablet Take 1 tablet (20 mg total) by mouth daily.   No facility-administered encounter medications on file as of 04/16/2015.      Review of Systems  Genitourinary: Positive for urgency.  Musculoskeletal: Positive for arthralgias.  Psychiatric/Behavioral: Positive for decreased concentration.       Objective:   Physical Exam  Constitutional: She is oriented to person, place, and time. She appears well-developed and well-nourished.  Cardiovascular: Normal rate and regular rhythm.   Pulmonary/Chest: Effort normal and breath sounds normal.  Musculoskeletal:  Left knee with crepitance. There is no swelling. As noted above, she is to be seen for consideration of joint replacement  Neurological: She is alert and oriented to person, place, and time.  Brief evaluation of her memory today she remembers 2 of 3 items at 5 minutes but failed clock drawing test. This raises the suspicion of some dementia which the daughter seems to blame on depression. She is however being treated with Paxil for depression  Psychiatric: She has a normal mood and affect.          Assessment & Plan:  1. Hyperlipidemia Lipids are at goal. Continue on same dose of atorvastatin  2. Type 2 diabetes mellitus with insulin therapy Diet has  been skimpy. Last A1c was still 8.7. She will need better nutrition if she is considering total knee replacement  3. Urinary incontinence, unspecified incontinence type History of urosepsis and incontinence and urinalysis showed will check urine and culture - POCT UA - Microscopic Only - POCT urinalysis dipstick - Urine   Wardell Honour MD

## 2015-04-18 LAB — URINE CULTURE

## 2015-04-19 ENCOUNTER — Telehealth: Payer: Self-pay | Admitting: *Deleted

## 2015-04-19 MED ORDER — SULFAMETHOXAZOLE-TRIMETHOPRIM 800-160 MG PO TABS: 1.0000 | ORAL_TABLET | Freq: Two times a day (BID) | ORAL | Status: DC

## 2015-04-19 NOTE — Telephone Encounter (Signed)
Medication sent to pharmacy. Family aware

## 2015-04-19 NOTE — Telephone Encounter (Signed)
-----   Message from Chipper Herb, MD sent at 04/19/2015 11:21 AM EDT ----- Have the patient take Septra DS 1 twice daily for 7 days with food

## 2015-04-22 ENCOUNTER — Telehealth: Payer: Self-pay | Admitting: *Deleted

## 2015-04-22 NOTE — Telephone Encounter (Signed)
-----   Message from Wardell Honour, MD sent at 04/22/2015  8:00 AM EDT ----- UTI needs treatment: Cipro 500 mg BID x 7 days

## 2015-04-22 NOTE — Telephone Encounter (Signed)
lmtcb regarding test results. 

## 2015-04-25 DIAGNOSIS — M1712 Unilateral primary osteoarthritis, left knee: Secondary | ICD-10-CM | POA: Diagnosis not present

## 2015-04-25 DIAGNOSIS — Z6829 Body mass index (BMI) 29.0-29.9, adult: Secondary | ICD-10-CM | POA: Diagnosis not present

## 2015-04-25 DIAGNOSIS — I10 Essential (primary) hypertension: Secondary | ICD-10-CM | POA: Diagnosis not present

## 2015-05-16 DIAGNOSIS — M1712 Unilateral primary osteoarthritis, left knee: Secondary | ICD-10-CM | POA: Diagnosis not present

## 2015-05-16 DIAGNOSIS — M25462 Effusion, left knee: Secondary | ICD-10-CM | POA: Diagnosis not present

## 2015-05-17 ENCOUNTER — Encounter: Payer: Self-pay | Admitting: Family Medicine

## 2015-05-17 ENCOUNTER — Ambulatory Visit (INDEPENDENT_AMBULATORY_CARE_PROVIDER_SITE_OTHER): Payer: Medicare Other | Admitting: Family Medicine

## 2015-05-17 VITALS — BP 127/67 | HR 61 | Temp 98.6°F | Ht 66.0 in | Wt 183.0 lb

## 2015-05-17 DIAGNOSIS — I10 Essential (primary) hypertension: Secondary | ICD-10-CM

## 2015-05-17 DIAGNOSIS — I25709 Atherosclerosis of coronary artery bypass graft(s), unspecified, with unspecified angina pectoris: Secondary | ICD-10-CM

## 2015-05-17 DIAGNOSIS — E119 Type 2 diabetes mellitus without complications: Secondary | ICD-10-CM

## 2015-05-17 DIAGNOSIS — Z794 Long term (current) use of insulin: Secondary | ICD-10-CM

## 2015-05-17 NOTE — Progress Notes (Signed)
Subjective:    [name], female    DOB: 03-Apr-1931, 81 y.o.   MRN: [id number]  HPI  Seen by ortho yesterday and offered TKR if cleared by medical DR's.  At her age I questioned her will to undergo surgery and rehab and she seems determined.  i think medical issues can be managed if she has the desire and willing to work at rehab.  Patient Active Problem List   Diagnosis Date Noted  . Elevated troponin 03/06/2015  . Fever   . History of diabetes mellitus   . Left buttock pain   . Hyperlipidemia 01/11/2015  . GAD (generalized anxiety disorder) 01/11/2015  . Depression 01/11/2015  . GERD (gastroesophageal reflux disease) 01/11/2015  . Osteopenia 07/13/2014  . Obesity (BMI 30-39.9) 06/19/2014  . Carotid stenosis   . Anemia of chronic disease 12/25/2011  . Iron deficiency anemia 05/27/2011  . ACUTE DIASTOLIC HEART FAILURE 123456  . Type 2 diabetes mellitus with insulin therapy 10/08/2009  . DYSLIPIDEMIA 10/08/2009  . Essential hypertension 10/08/2009  . Coronary atherosclerosis 10/08/2009  . CAROTID STENOSIS 10/08/2009   Outpatient Encounter Prescriptions as of 05/17/2015  Medication Sig  . acidophilus (RISAQUAD) CAPS capsule Take 1 capsule by mouth daily.  Marland Kitchen amLODipine (NORVASC) 10 MG tablet Take 1 tablet (10 mg total) by mouth daily.  . Artificial Tear Ointment (DRY EYES OP) Apply 1 drop to eye daily as needed. For dry eye/ red eye   . aspirin 81 MG EC tablet Take 81 mg by mouth at bedtime.   Marland Kitchen atorvastatin (LIPITOR) 20 MG tablet Take 1 tablet (20 mg total) by mouth at bedtime.  . clopidogrel (PLAVIX) 75 MG tablet Take 1 tablet (75 mg total) by mouth daily.  Marland Kitchen EASY TOUCH INSULIN SYRINGE 31G X 5/16" 1 ML MISC USE AS DIRECTED.  Marland Kitchen esomeprazole (NEXIUM) 40 MG capsule Take 1 capsule (40 mg total) by mouth daily as needed (FO ACID REFLUX/GERD).  . furosemide (LASIX) 20 MG tablet Take 1 tablet (20 mg total) by mouth daily as needed for fluid or edema.  .  Ibuprofen 200 MG CAPS Take 2 capsules by mouth 2 (two) times daily.  . insulin NPH Human (HUMULIN N) 100 UNIT/ML injection Inject 15 units each morning with breakfast (Patient taking differently: Inject 63 Units into the skin daily with breakfast. )  . insulin regular (HUMULIN R) 100 units/mL injection Humulin R sliding scale: Blood sugar 120-150 3units                       151-200 4units                      201-250 7units                     251- 300 11units                     301- 350 15units                      351-400  20units                      > 400 call MD immdediately (Patient taking differently: Inject 3-20 Units into the skin 3 (three) times daily as needed (SLIDING SCALE LISTED). Humulin R sliding scale: Blood sugar 120-150 3units  151-200 4units                      201-250 7units                     251- 300 11units                     301- 350 15units                      351-400  20units                      > 400 call MD immdediately)  . isosorbide mononitrate (IMDUR) 30 MG 24 hr tablet Take 1 tablet (30 mg total) by mouth daily.  Marland Kitchen lisinopril (PRINIVIL,ZESTRIL) 20 MG tablet Take 1 tablet (20 mg total) by mouth daily.  Marland Kitchen LORazepam (ATIVAN) 0.5 MG tablet TAKE (1) TABLET TWICE DAILY.  . meclizine (ANTIVERT) 25 MG tablet TAKE 1 TABLET TWICE DAILY AS NEEDED FOR DIZZINESS. (Patient taking differently: TAKE 1 TABLET ONCE EVERY DAY IN THE MORNING)  . metFORMIN (GLUCOPHAGE XR) 500 MG 24 hr tablet Take 1 tablet with breakfast and 1 tablet with supper  . metoprolol (LOPRESSOR) 50 MG tablet Take 1 tablet (50 mg total) by mouth 2 (two) times daily.  . Multiple Vitamins-Iron (DAILY VITAMINS/IRON/BETA CAROT PO) Take 1 tablet by mouth at bedtime.  Marland Kitchen NITROSTAT 0.4 MG SL tablet PLACE ONE (1) TABLET UNDER TONGUE EVERY 5 MINUTES UP TO (3) DOSES AS NEEDED FOR CHEST PAIN.  Marland Kitchen PARoxetine (PAXIL) 20 MG tablet Take 1 tablet (20 mg total) by mouth daily.  . [DISCONTINUED]  isosorbide dinitrate (ISORDIL) 30 MG tablet Take 30 mg by mouth daily.  . canagliflozin (INVOKANA) 300 MG TABS tablet Take 300 mg by mouth daily before breakfast.  . [DISCONTINUED] atorvastatin (LIPITOR) 20 MG tablet TAKE (1) TABLET BY MOUTH ONCE DAILY.  . [DISCONTINUED] cephALEXin (KEFLEX) 250 MG capsule Take 1 capsule by mouth 3 (three) times daily.  . [DISCONTINUED] cephALEXin (KEFLEX) 500 MG capsule Take 1 capsule (500 mg total) by mouth 3 (three) times daily.  . [DISCONTINUED] sulfamethoxazole-trimethoprim (BACTRIM DS,SEPTRA DS) 800-160 MG per tablet Take 1 tablet by mouth 2 (two) times daily with a meal.   No facility-administered encounter medications on file as of 05/17/2015.      Review of Systems  Constitutional: Negative.   Respiratory: Negative.   Cardiovascular: Negative.   Musculoskeletal: Positive for arthralgias.  Neurological: Negative.   Psychiatric/Behavioral: Negative.        Objective:   Physical Exam  Constitutional: She is oriented to person, place, and time. She appears well-developed and well-nourished.  Cardiovascular: Normal rate and regular rhythm.   Pulmonary/Chest: Effort normal and breath sounds normal.  Neurological: She is alert and oriented to person, place, and time.    BP 127/67 mmHg  Pulse 61  Temp(Src) 98.6 F (37 C) (Oral)  Ht 5\' 6"  (1.676 m)  Wt 183 lb (83.008 kg)  BMI 29.55 kg/m2       Assessment & Plan:  1. Essential hypertension BO controllled on current regimen  2. 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 Type 2 diabetes mellitus with insulin therapy Will need monitoring in hospital with SSI  I think medical issues can be managed if pt is will ing and recognizes work required post  op  Wardell Honour MD

## 2015-05-21 DIAGNOSIS — L84 Corns and callosities: Secondary | ICD-10-CM | POA: Diagnosis not present

## 2015-05-21 DIAGNOSIS — E1142 Type 2 diabetes mellitus with diabetic polyneuropathy: Secondary | ICD-10-CM | POA: Diagnosis not present

## 2015-05-21 DIAGNOSIS — B351 Tinea unguium: Secondary | ICD-10-CM | POA: Diagnosis not present

## 2015-05-22 ENCOUNTER — Telehealth: Payer: Self-pay | Admitting: *Deleted

## 2015-05-22 NOTE — Telephone Encounter (Signed)
Left message for pt to call.

## 2015-05-22 NOTE — Telephone Encounter (Signed)
She needs to be seen in the office before she can be cleared.

## 2015-05-22 NOTE — Telephone Encounter (Signed)
Patient is needing clearance for LEFT TKA-MEDIAL & LATERAL W/WO PATELLA RESURFACING. Will forward for dr hochrein's review

## 2015-05-28 NOTE — Telephone Encounter (Signed)
Spoke with pt dtr,aware of appt to see dr hochrein 06-12-15 @ 1pm in the [place] office.

## 2015-05-28 NOTE — Telephone Encounter (Signed)
Spoke with pt dtr, they would like to see dr hochrein in [place]. His next time there is 06-12-15. Message sent to dr hochrein to help with placement of pt

## 2015-06-06 ENCOUNTER — Other Ambulatory Visit: Payer: Self-pay

## 2015-06-06 ENCOUNTER — Other Ambulatory Visit: Payer: Self-pay | Admitting: Family Medicine

## 2015-06-06 ENCOUNTER — Other Ambulatory Visit: Payer: Self-pay | Admitting: Family

## 2015-06-06 MED ORDER — NITROGLYCERIN 0.4 MG SL SUBL: SUBLINGUAL_TABLET | SUBLINGUAL | Status: DC

## 2015-06-06 NOTE — Telephone Encounter (Signed)
Last seen 05/17/15  Dr Sabra Heck  If approved route to nurse to call into [facility]  [phone]

## 2015-06-11 MED ORDER — LORAZEPAM 0.5 MG PO TABS: ORAL_TABLET | ORAL | Status: DC

## 2015-06-12 ENCOUNTER — Telehealth (HOSPITAL_COMMUNITY): Payer: Self-pay

## 2015-06-12 ENCOUNTER — Ambulatory Visit (INDEPENDENT_AMBULATORY_CARE_PROVIDER_SITE_OTHER): Payer: Medicare Other | Admitting: Cardiology

## 2015-06-12 ENCOUNTER — Encounter: Payer: Self-pay | Admitting: Cardiology

## 2015-06-12 VITALS — BP 100/60 | HR 59 | Ht 66.0 in | Wt 181.0 lb

## 2015-06-12 DIAGNOSIS — I25709 Atherosclerosis of coronary artery bypass graft(s), unspecified, with unspecified angina pectoris: Secondary | ICD-10-CM

## 2015-06-12 DIAGNOSIS — I10 Essential (primary) hypertension: Secondary | ICD-10-CM

## 2015-06-12 DIAGNOSIS — I6523 Occlusion and stenosis of bilateral carotid arteries: Secondary | ICD-10-CM

## 2015-06-12 DIAGNOSIS — Z01818 Encounter for other preprocedural examination: Secondary | ICD-10-CM

## 2015-06-12 NOTE — Telephone Encounter (Signed)
Left message on voicemail in reference to upcoming appointment scheduled for 06-14-2015. Phone number given for a call back so details instructions can be given. Oletta Lamas, Sarae Nicholes A

## 2015-06-12 NOTE — Progress Notes (Signed)
HPI The patient presents for one-year followup and preoperative evaluation. She is going to have left knee replacement. Since I last saw her she was in the hospital and I reviewed these records. She was seen in late March with nausea found to be hyperglycemic with possible sepsis. This might of any urinary source. She did have elevated cardiac enzymes with a peak of 0.63. No ischemia workup was undertaken. An echocardiogram was done which demonstrated calcific aortic and mitral valves but no other significant abnormalities are well preserved ejection fraction. She is limited by knee pain. However, she can do some mild chores. With this level of activity she denies any cardiovascular symptoms such as chest pressure, neck or arm discomfort. She has some chronic dyspnea but no new shortness of breath, PND or orthopnea. She has no weight gain or edema.   Allergies  Allergen Reactions  . Heparin Other (See Comments)    Confusion  . Propoxyphene N-Acetaminophen Other (See Comments)    Numbness all over. "floating" sensation.    Current Outpatient Prescriptions  Medication Sig Dispense Refill  . amLODipine (NORVASC) 10 MG tablet Take 1 tablet (10 mg total) by mouth daily. 30 tablet 11  . Artificial Tear Ointment (DRY EYES OP) Apply 1 drop to eye daily as needed. For dry eye/ red eye     . aspirin 81 MG EC tablet Take 81 mg by mouth at bedtime.     Marland Kitchen atorvastatin (LIPITOR) 20 MG tablet Take 1 tablet (20 mg total) by mouth at bedtime. 30 tablet 11  . clopidogrel (PLAVIX) 75 MG tablet Take 1 tablet (75 mg total) by mouth daily. 30 tablet 11  . esomeprazole (NEXIUM) 40 MG capsule Take 1 capsule (40 mg total) by mouth daily as needed (FO ACID REFLUX/GERD). 30 capsule 11  . furosemide (LASIX) 20 MG tablet Take 1 tablet (20 mg total) by mouth daily as needed for fluid or edema. 30 tablet 5  . Ibuprofen 200 MG CAPS Take 2 capsules by mouth 2 (two) times daily.    . insulin NPH Human (HUMULIN N) 100  UNIT/ML injection Inject 15 units each morning with breakfast (Patient taking differently: Inject 63 Units into the skin daily with breakfast. ) 40 mL 11  . insulin regular (HUMULIN R) 100 units/mL injection Humulin R sliding scale: Blood sugar 120-150 3units                       151-200 4units                      201-250 7units                     251- 300 11units                     301- 350 15units                      351-400  20units                      > 400 call MD immdediately (Patient taking differently: Inject 3-20 Units into the skin 3 (three) times daily as needed (SLIDING SCALE LISTED). Humulin R sliding scale: Blood sugar 120-150 3units                       151-200 4units  201-250 7units                     251- 300 11units                     301- 350 15units                      351-400  20units                      > 400 call MD immdediately) 10 mL 11  . isosorbide mononitrate (IMDUR) 30 MG 24 hr tablet Take 1 tablet (30 mg total) by mouth daily. 30 tablet 3  . lisinopril (PRINIVIL,ZESTRIL) 20 MG tablet TAKE (1) TABLET BY MOUTH ONCE DAILY. 30 tablet 5  . LORazepam (ATIVAN) 0.5 MG tablet TAKE (1) TABLET TWICE DAILY. 60 tablet 0  . metFORMIN (GLUCOPHAGE XR) 500 MG 24 hr tablet Take 1 tablet with breakfast and 1 tablet with supper 60 tablet 11  . metoprolol (LOPRESSOR) 50 MG tablet Take 1 tablet (50 mg total) by mouth 2 (two) times daily. 60 tablet 11  . Multiple Vitamins-Iron (DAILY VITAMINS/IRON/BETA CAROT PO) Take 1 tablet by mouth at bedtime.    . nitroGLYCERIN (NITROSTAT) 0.4 MG SL tablet PLACE ONE (1) TABLET UNDER TONGUE EVERY 5 MINUTES UP TO (3) DOSES AS NEEDED FOR CHEST PAIN. 25 tablet 2  . PARoxetine (PAXIL) 20 MG tablet Take 1 tablet (20 mg total) by mouth daily. 30 tablet 11  . canagliflozin (INVOKANA) 300 MG TABS tablet Take 300 mg by mouth daily before breakfast.    . EASY TOUCH INSULIN SYRINGE 31G X 5/16" 1 ML MISC USE AS DIRECTED. 80 each  2  . meclizine (ANTIVERT) 25 MG tablet TAKE 1 TABLET TWICE DAILY AS NEEDED FOR DIZZINESS. (Patient not taking: Reported on 06/12/2015) 60 tablet 2   No current facility-administered medications for this visit.    Past Medical History  Diagnosis Date  . Anemia   . Diabetes mellitus   . Hypertension   . Iron deficiency anemia 05/27/2011  . CHF (congestive heart failure)     EF preserved Echo 2012  . CVA (cerebral infarction)   . Carotid stenosis   . Renal insufficiency   . Furuncle of back, except buttock   . Vertigo   . CAD (coronary artery disease)   . Arthritis     Past Surgical History  Procedure Laterality Date  . Cholecystectomy    . I & d of furuncle  April 2013  . Total abdominal hysterectomy      complete  . Coronary artery bypass graft  2006    ROS:  Back pain, balance difficulties. Otherwise as stated in the HPI and negative for all other systems.  PHYSICAL EXAM BP 100/60 mmHg  Pulse 59  Ht 5\' 6"  (1.676 m)  Wt 181 lb (82.101 kg)  BMI 29.23 kg/m2 GENERAL:  Well appearing HEENT:  Pupils equal round and reactive, fundi not visualized, oral mucosa unremarkable, dentures NECK:  No jugular venous distention, waveform within normal limits, carotid upstroke brisk and symmetric, left bruits, no thyromegaly LYMPHATICS:  No cervical, inguinal adenopathy LUNGS:  Clear to auscultation bilaterally BACK:  No CVA tenderness CHEST:  Well healed sternotomy scar. HEART:  PMI not displaced or sustained,S1 and S2 within normal limits, no S3, no S4, no clicks, no rubs, no murmurs ABD:  Flat, positive bowel sounds normal in frequency in pitch,  no bruits, no rebound, no guarding, no midline pulsatile mass, no hepatomegaly, no splenomegaly EXT:  2 plus pulses upper and diminished dorsalis pedis and posterior tibialis bilateral lower extremities, mild bilateral lower extremity ankle edema, no cyanosis no clubbing NEURO:  Nonfocal  EKG:  Sinus rhythm, rate 59, axis within normal  limits, intervals within normal limits, no acute ST-T wave changes, inferolateral ST depressions unchanged from previous 06/12/2015  ASSESSMENT AND PLAN  PREOP:  The patient will need stress testing prior to surgery given her recent elevated enzymes. However, she would not be a walk on a treadmill. Therefore, she will have a Lexiscan Myoview to look for high risk findings.  CAD:  This will be evaluated as above. She will continue with risk reduction.  CAROTID STENOSIS:  She has 40-59% bilateral stenosis which has been stable.  HTN:  The blood pressure is at target. No change in medications is indicated. We will continue with therapeutic lifestyle changes (TLC)

## 2015-06-12 NOTE — Patient Instructions (Addendum)
Medication Instructions:  Your physician recommends that you continue on your current medications as directed. Please refer to the Current Medication list given to you today.  Testing/Procedures: Your physician has requested that you have a lexiscan myoview. For further information please visit [email or web address]. Please follow instruction sheet, as given.  Follow-Up: Follow up in 1 year with Dr. Percival Spanish in [place].  You will receive a letter in the mail 2 months before you are due.  Please call us when you receive this letter to schedule your follow up appointment.  Thank you for choosing [facility]!!

## 2015-06-13 ENCOUNTER — Telehealth (HOSPITAL_COMMUNITY): Payer: Self-pay

## 2015-06-13 NOTE — Telephone Encounter (Signed)
Patient given permission for me to speak with her daughter, [name]. Patient's daughter given detailed instructions per Myocardial Perfusion Study Information Sheet for test on 06-14-2015 at 9:15am. Patient's daughter notified to arrive 15 minutes early, and that it is imperative to arrive on time for appointment to keep from having the test rescheduled. Patient verbalized understanding. Oletta Lamas, Jayvier Burgher A

## 2015-06-14 ENCOUNTER — Ambulatory Visit (HOSPITAL_COMMUNITY): Payer: Medicare Other | Attending: Cardiology

## 2015-06-14 DIAGNOSIS — Z01818 Encounter for other preprocedural examination: Secondary | ICD-10-CM

## 2015-06-14 DIAGNOSIS — E119 Type 2 diabetes mellitus without complications: Secondary | ICD-10-CM | POA: Insufficient documentation

## 2015-06-14 DIAGNOSIS — I25709 Atherosclerosis of coronary artery bypass graft(s), unspecified, with unspecified angina pectoris: Secondary | ICD-10-CM | POA: Diagnosis not present

## 2015-06-14 DIAGNOSIS — I779 Disorder of arteries and arterioles, unspecified: Secondary | ICD-10-CM | POA: Insufficient documentation

## 2015-06-14 DIAGNOSIS — I2510 Atherosclerotic heart disease of native coronary artery without angina pectoris: Secondary | ICD-10-CM | POA: Diagnosis not present

## 2015-06-14 DIAGNOSIS — Z951 Presence of aortocoronary bypass graft: Secondary | ICD-10-CM | POA: Insufficient documentation

## 2015-06-14 LAB — MYOCARDIAL PERFUSION IMAGING
CHL CUP NUCLEAR SSS: 7
CSEPPHR: 69 {beats}/min
LV dias vol: 103 mL
LV sys vol: 41 mL
RATE: 0.2
Rest HR: 59 {beats}/min
SDS: 4
SRS: 3
TID: 1.08

## 2015-06-14 MED ORDER — REGADENOSON 0.4 MG/5ML IV SOLN
0.4000 mg | Freq: Once | INTRAVENOUS | Status: AC
  Administered 2015-06-14: 0.4 mg via INTRAVENOUS

## 2015-06-14 MED ORDER — TECHNETIUM TC 99M SESTAMIBI GENERIC - CARDIOLITE
32.3000 | Freq: Once | INTRAVENOUS | Status: AC | PRN
  Administered 2015-06-14: 32.3 via INTRAVENOUS

## 2015-06-14 MED ORDER — TECHNETIUM TC 99M SESTAMIBI GENERIC - CARDIOLITE
10.5000 | Freq: Once | INTRAVENOUS | Status: AC | PRN
  Administered 2015-06-14: 11 via INTRAVENOUS

## 2015-06-18 ENCOUNTER — Telehealth: Payer: Self-pay | Admitting: *Deleted

## 2015-06-18 NOTE — Telephone Encounter (Signed)
Does she have to hold both?  Plavix x 5 days is OK.  Hold ASA x 5 days only if surgeon requests this.

## 2015-06-18 NOTE — Telephone Encounter (Signed)
Requesting surgical clearance:   1. Type of surgery: Left Knee Replacement  2. Surgeon: Dr Paralee Cancel  3. Surgical date:   4. Medications that need to be help: Aspirin and Plavix  I saw you cleared this patient in your result note can you please specify how long can patient hold ASA and Plavix.

## 2015-07-01 ENCOUNTER — Other Ambulatory Visit: Payer: Self-pay | Admitting: Family Medicine

## 2015-07-03 ENCOUNTER — Other Ambulatory Visit: Payer: Self-pay | Admitting: *Deleted

## 2015-07-03 MED ORDER — ISOSORBIDE MONONITRATE ER 30 MG PO TB24: 30.0000 mg | ORAL_TABLET | Freq: Every day | ORAL | Status: DC

## 2015-07-09 ENCOUNTER — Other Ambulatory Visit: Payer: Self-pay | Admitting: *Deleted

## 2015-07-09 MED ORDER — LORAZEPAM 0.5 MG PO TABS: ORAL_TABLET | ORAL | Status: DC

## 2015-07-09 NOTE — Telephone Encounter (Signed)
Last filled 06/11/15, last seen 05/17/15 by Sabra Heck. Route to pool, call into [facility]

## 2015-07-10 ENCOUNTER — Other Ambulatory Visit: Payer: Self-pay | Admitting: *Deleted

## 2015-07-10 NOTE — Telephone Encounter (Signed)
Lorazepam script called to [facility].

## 2015-07-17 ENCOUNTER — Other Ambulatory Visit: Payer: Self-pay | Admitting: Nurse Practitioner

## 2015-07-18 ENCOUNTER — Other Ambulatory Visit: Payer: Self-pay

## 2015-07-23 ENCOUNTER — Encounter: Payer: Self-pay | Admitting: Family Medicine

## 2015-07-23 ENCOUNTER — Ambulatory Visit (INDEPENDENT_AMBULATORY_CARE_PROVIDER_SITE_OTHER): Payer: Medicare Other | Admitting: Family Medicine

## 2015-07-23 VITALS — BP 156/69 | HR 63 | Temp 97.6°F | Ht 66.0 in | Wt 182.0 lb

## 2015-07-23 DIAGNOSIS — E119 Type 2 diabetes mellitus without complications: Secondary | ICD-10-CM

## 2015-07-23 DIAGNOSIS — I10 Essential (primary) hypertension: Secondary | ICD-10-CM | POA: Diagnosis not present

## 2015-07-23 DIAGNOSIS — Z794 Long term (current) use of insulin: Secondary | ICD-10-CM

## 2015-07-23 DIAGNOSIS — E785 Hyperlipidemia, unspecified: Secondary | ICD-10-CM | POA: Diagnosis not present

## 2015-07-23 DIAGNOSIS — R300 Dysuria: Secondary | ICD-10-CM | POA: Diagnosis not present

## 2015-07-23 LAB — POCT URINALYSIS DIPSTICK
Bilirubin, UA: NEGATIVE
Glucose, UA: NEGATIVE
Ketones, UA: NEGATIVE
NITRITE UA: POSITIVE
Spec Grav, UA: 1.02
UROBILINOGEN UA: NEGATIVE
pH, UA: 7.5

## 2015-07-23 LAB — POCT GLYCOSYLATED HEMOGLOBIN (HGB A1C): HEMOGLOBIN A1C: 7.9

## 2015-07-23 LAB — POCT UA - MICROSCOPIC ONLY
Casts, Ur, LPF, POC: NEGATIVE
Crystals, Ur, HPF, POC: NEGATIVE
Yeast, UA: NEGATIVE

## 2015-07-23 MED ORDER — NITROFURANTOIN MONOHYD MACRO 100 MG PO CAPS: 100.0000 mg | ORAL_CAPSULE | Freq: Two times a day (BID) | ORAL | Status: DC

## 2015-07-23 NOTE — Addendum Note (Signed)
Addended by: Ilean China on: 07/23/2015 09:21 AM   Modules accepted: Orders

## 2015-07-23 NOTE — Progress Notes (Signed)
Subjective:    [name], female    DOB: 1931/08/31, 81 y.o.   MRN: [id number]  HPI 81-year-old female here to follow-up diabetes hypertension and lipids. Since she was seen here she was seen for a stress test by cardiology who by their reports cleared her for total knee replacement. It is not yet scheduled but I've asked patient's daughter to call schedule her and go ahead and get that done. I still have reservations about surgery given 81 her age and morbidities but they are determined. She is getting around better using her walker rather than a wheelchair today. Her care is managed primarily by her daughter whom she lives with think she does a pretty good job.   Patient Active Problem List   Diagnosis Date Noted  . Elevated troponin 03/06/2015  . Fever   . History of diabetes mellitus   . Left buttock pain   . Hyperlipidemia 01/11/2015  . GAD (generalized anxiety disorder) 01/11/2015  . Depression 01/11/2015  . GERD (gastroesophageal reflux disease) 01/11/2015  . Osteopenia 07/13/2014  . Obesity (BMI 30-39.9) 06/19/2014  . Carotid stenosis   . Anemia of chronic disease 12/25/2011  . Iron deficiency anemia 05/27/2011  . ACUTE DIASTOLIC HEART FAILURE 123456  . Type 2 diabetes mellitus with insulin therapy 10/08/2009  . DYSLIPIDEMIA 10/08/2009  . Essential hypertension 10/08/2009  . Coronary atherosclerosis 10/08/2009  . CAROTID STENOSIS 10/08/2009   Outpatient Encounter Prescriptions as of 07/23/2015  Medication Sig  . amLODipine (NORVASC) 10 MG tablet Take 1 tablet (10 mg total) by mouth daily.  . Artificial Tear Ointment (DRY EYES OP) Apply 1 drop to eye daily as needed. For dry eye/ red eye   . aspirin 81 MG EC tablet Take 81 mg by mouth at bedtime.   Marland Kitchen atorvastatin (LIPITOR) 20 MG tablet Take 1 tablet (20 mg total) by mouth at bedtime.  . canagliflozin (INVOKANA) 300 MG TABS tablet Take 300 mg by mouth daily before breakfast.  . clopidogrel (PLAVIX) 75 MG  tablet Take 1 tablet (75 mg total) by mouth daily.  Marland Kitchen EASY TOUCH INSULIN SYRINGE 31G X 5/16" 1 ML MISC USE AS DIRECTED.  Marland Kitchen esomeprazole (NEXIUM) 40 MG capsule Take 1 capsule (40 mg total) by mouth daily as needed (FO ACID REFLUX/GERD).  Marland Kitchen esomeprazole (NEXIUM) 40 MG capsule TAKE 1 CAPSULE ONCE DAILY FOR ACID REFLUX.  . furosemide (LASIX) 20 MG tablet Take 1 tablet (20 mg total) by mouth daily as needed for fluid or edema.  . Ibuprofen 200 MG CAPS Take 2 capsules by mouth 2 (two) times daily.  . insulin NPH Human (HUMULIN N) 100 UNIT/ML injection Inject 15 units each morning with breakfast (Patient taking differently: Inject 63 Units into the skin daily with breakfast. )  . insulin regular (HUMULIN R) 100 units/mL injection Humulin R sliding scale: Blood sugar 120-150 3units                       151-200 4units                      201-250 7units                     251- 300 11units                     301- 350 15units  351-400  20units                      > 400 call MD immdediately (Patient taking differently: Inject 3-20 Units into the skin 3 (three) times daily as needed (SLIDING SCALE LISTED). Humulin R sliding scale: Blood sugar 120-150 3units                       151-200 4units                      201-250 7units                     251- 300 11units                     301- 350 15units                      351-400  20units                      > 400 call MD immdediately)  . isosorbide mononitrate (IMDUR) 30 MG 24 hr tablet Take 1 tablet (30 mg total) by mouth daily.  Marland Kitchen lisinopril (PRINIVIL,ZESTRIL) 20 MG tablet TAKE (1) TABLET BY MOUTH ONCE DAILY.  Marland Kitchen LORazepam (ATIVAN) 0.5 MG tablet TAKE (1) TABLET TWICE DAILY.  . meclizine (ANTIVERT) 25 MG tablet TAKE 1 TABLET TWICE DAILY AS NEEDED FOR DIZZINESS.  . metFORMIN (GLUCOPHAGE XR) 500 MG 24 hr tablet Take 1 tablet with breakfast and 1 tablet with supper  . metoprolol (LOPRESSOR) 50 MG tablet Take 1 tablet (50 mg  total) by mouth 2 (two) times daily.  . Multiple Vitamins-Iron (DAILY VITAMINS/IRON/BETA CAROT PO) Take 1 tablet by mouth at bedtime.  . nitroGLYCERIN (NITROSTAT) 0.4 MG SL tablet PLACE ONE (1) TABLET UNDER TONGUE EVERY 5 MINUTES UP TO (3) DOSES AS NEEDED FOR CHEST PAIN.  Marland Kitchen NITROSTAT 0.4 MG SL tablet PLACE 1 TABLET UNDER TONGUE EVERY FIVE MINUTES AS NEEDED FOR CHEST PAIN.  Marland Kitchen PARoxetine (PAXIL) 20 MG tablet Take 1 tablet (20 mg total) by mouth daily.   No facility-administered encounter medications on file as of 07/23/2015.     Review of Systems  Constitutional: Negative.   Eyes: Positive for redness.  Cardiovascular: Negative.   Gastrointestinal: Positive for abdominal pain and constipation.  Psychiatric/Behavioral: Negative.        Objective:   Physical Exam  Constitutional: She is oriented to person, place, and time. She appears well-developed and well-nourished.  Cardiovascular: Normal rate, regular rhythm and normal heart sounds.   Pulmonary/Chest: Effort normal and breath sounds normal.  Abdominal: Soft. Bowel sounds are normal.  Neurological: She is alert and oriented to person, place, and time.  Psychiatric: She has a normal mood and affect.    BP 156/69 mmHg  Pulse 63  Temp(Src) 97.6 F (36.4 C) (Oral)  Ht 5\' 6"  (1.676 m)  Wt 182 lb (82.555 kg)  BMI 29.39 kg/m2      Assessment & Plan:  1. Essential hypertension Blood pressure seems to vary is much lower at last visit today is a little elevated but she continues with lisinopril  2. Type 2 diabetes mellitus with insulin therapy Last A1c was not at goal. I think compliance with diabetes big part of elevated A1c's and I have encouraged carb restrictions but I think she probably will eat what she wants in the  final analysis - POCT glycosylated hemoglobin (Hb A1C)  3. Hyperlipidemia Lipids are at goal with LDL of 55  4. Dysuria Urinalysis is pending  Apparently she does have some pain with urination but no real    - POCT UA - Microscopic Only  Wardell Honour MD - POCT urinalysis dipstick

## 2015-07-23 NOTE — Patient Instructions (Addendum)
You may receive a survey either by mail or email. Please take time to complete this as it helps [place] to serve you better.  Fall Prevention and Home Safety Falls cause injuries and can affect all age groups. It is possible to use preventive measures to significantly decrease the likelihood of falls. There are many simple measures which can make your home safer and prevent falls. OUTDOORS  Repair cracks and edges of walkways and driveways.  Remove high doorway thresholds.  Trim shrubbery on the main path into your home.  Have good outside lighting.  Clear walkways of tools, rocks, debris, and clutter.  Check that handrails are not broken and are securely fastened. Both sides of steps should have handrails.  Have leaves, snow, and ice cleared regularly.  Use sand or salt on walkways during winter months.  In the garage, clean up grease or oil spills. BATHROOM  Install night lights.  Install grab bars by the toilet and in the tub and shower.  Use non-skid mats or decals in the tub or shower.  Place a plastic non-slip stool in the shower to sit on, if needed.  Keep floors dry and clean up all water on the floor immediately.  Remove soap buildup in the tub or shower on a regular basis.  Secure bath mats with non-slip, double-sided rug tape.  Remove throw rugs and tripping hazards from the floors. BEDROOMS  Install night lights.  Make sure a bedside light is easy to reach.  Do not use oversized bedding.  Keep a telephone by your bedside.  Have a firm chair with side arms to use for getting dressed.  Remove throw rugs and tripping hazards from the floor. KITCHEN  Keep handles on pots and pans turned toward the center of the stove. Use back burners when possible.  Clean up spills quickly and allow time for drying.  Avoid walking on wet floors.  Avoid hot utensils and knives.  Position shelves so they are not too high or low.  Place commonly used objects within  easy reach.  If necessary, use a sturdy step stool with a grab bar when reaching.  Keep electrical cables out of the way.  Do not use floor polish or wax that makes floors slippery. If you must use wax, use non-skid floor wax.  Remove throw rugs and tripping hazards from the floor. STAIRWAYS  Never leave objects on stairs.  Place handrails on both sides of stairways and use them. Fix any loose handrails. Make sure handrails on both sides of the stairways are as long as the stairs.  Check carpeting to make sure it is firmly attached along stairs. Make repairs to worn or loose carpet promptly.  Avoid placing throw rugs at the top or bottom of stairways, or properly secure the rug with carpet tape to prevent slippage. Get rid of throw rugs, if possible.  Have an electrician put in a light switch at the top and bottom of the stairs. OTHER FALL PREVENTION TIPS  Wear low-heel or rubber-soled shoes that are supportive and fit well. Wear closed toe shoes.  When using a stepladder, make sure it is fully opened and both spreaders are firmly locked. Do not climb a closed stepladder.  Add color or contrast paint or tape to grab bars and handrails in your home. Place contrasting color strips on first and last steps.  Learn and use mobility aids as needed. Install an electrical emergency response system.  Turn on lights to avoid dark areas.  Replace light bulbs that burn out immediately. Get light switches that glow.  Arrange furniture to create clear pathways. Keep furniture in the same place.  Firmly attach carpet with non-skid or double-sided tape.  Eliminate uneven floor surfaces.  Select a carpet pattern that does not visually hide the edge of steps.  Be aware of all pets. OTHER HOME SAFETY TIPS  Set the water temperature for 120 F (48.8 C).  Keep emergency numbers on or near the telephone.  Keep smoke detectors on every level of the home and near sleeping areas. Document  Released: 11/13/2002 Document Revised: 05/24/2012 Document Reviewed: 02/12/2012 [facility] Patient Information 2015 [place], [place]. This information is not intended to replace advice given to you by your health care provider. Make sure you discuss any questions you have with your health care provider.

## 2015-07-23 NOTE — Addendum Note (Signed)
Addended by: Earlene Plater on: 07/23/2015 09:11 AM   Modules accepted: Orders

## 2015-07-24 NOTE — Telephone Encounter (Signed)
Another surgical clearance fax received from [facility].   Called Orson Slick, scheduler for Dr. Alvan Dame. They have not received clearance fax that was sent via [employer] on 06/18/15 Faxed stress test report with clearance notation and encounter note below to [phone] Attn: Judeen Hammans

## 2015-07-25 LAB — URINE CULTURE

## 2015-07-30 DIAGNOSIS — B351 Tinea unguium: Secondary | ICD-10-CM | POA: Diagnosis not present

## 2015-07-30 DIAGNOSIS — E1142 Type 2 diabetes mellitus with diabetic polyneuropathy: Secondary | ICD-10-CM | POA: Diagnosis not present

## 2015-07-30 DIAGNOSIS — L84 Corns and callosities: Secondary | ICD-10-CM | POA: Diagnosis not present

## 2015-08-02 DIAGNOSIS — M25562 Pain in left knee: Secondary | ICD-10-CM | POA: Diagnosis not present

## 2015-08-02 DIAGNOSIS — M25561 Pain in right knee: Secondary | ICD-10-CM | POA: Diagnosis not present

## 2015-08-02 DIAGNOSIS — M1712 Unilateral primary osteoarthritis, left knee: Secondary | ICD-10-CM | POA: Diagnosis not present

## 2015-08-05 ENCOUNTER — Other Ambulatory Visit: Payer: Self-pay | Admitting: Family Medicine

## 2015-08-05 NOTE — Patient Instructions (Addendum)
[name]  08/05/2015   Your procedure is scheduled on:    08/13/2015    Report to [facility] Main  Entrance take [place]  elevators to 3rd floor to  [place] at    1055 AM.  Call this number if you have problems the morning of surgery [phone]   Remember: ONLY 1 PERSON MAY GO WITH YOU TO SHORT STAY TO GET  READY MORNING OF [place].  Do not eat food or drink after midnite.  Eat a good healthy snack prior to bedtime.       Take these medicines the morning of surgery with A SIP OF WATER: NO diabetic medications am of surgery.               Amlodipine ( Norvasc), Artificial tear ointment if needed, Nexium, Isosorbid Mononitrate ( Imdur), Lorazepam ( Ativan), Meclizine, Metoprolol ( Lopressor), Paxil                                You may not have any metal on your body including hair pins and              piercings  Do not wear jewelry, make-up, lotions, powders or perfumes, deodorant             Do not wear nail polish.  Do not shave  48 hours prior to surgery.              Men may shave face and neck.  Do not bring valuables to the hospital. [place].  Contacts, dentures or bridgework may not be worn into surgery.  Leave suitcase in the car. After surgery it may be brought to your room.               Please read over the following fact sheets you were given: MRSA information _____________________________________________________________________           [facility] - Preparing for Surgery Before surgery, you can play an important role.  Because skin is not sterile, your skin needs to be as free of germs as possible.  You can reduce the number of germs on your skin by washing with CHG (chlorahexidine gluconate) soap before surgery.  CHG is an antiseptic cleaner which kills germs and bonds with the skin to continue killing germs even after washing. Please DO NOT use if you have an allergy to CHG or  antibacterial soaps.  If your skin becomes reddened/irritated stop using the CHG and inform your nurse when you arrive at Short Stay. Do not shave (including legs and underarms) for at least 48 hours prior to the first CHG shower.  You may shave your face/neck. Please follow these instructions carefully:  1.  Shower with CHG Soap the night before surgery and the  morning of Surgery.  2.  If you choose to wash your hair, wash your hair first as usual with your  normal  shampoo.  3.  After you shampoo, rinse your hair and body thoroughly to remove the  shampoo.                            4.  Use CHG as you  would any other liquid soap.  You can apply chg directly  to the skin and wash                       Gently with a scrungie or clean washcloth.  5.  Apply the CHG Soap to your body ONLY FROM THE NECK DOWN.   Do not use on face/ open                           Wound or open sores. Avoid contact with eyes, ears mouth and genitals (private parts).                       Wash face,  Genitals (private parts) with your normal soap.             6.  Wash thoroughly, paying special attention to the area where your surgery  will be performed.  7.  Thoroughly rinse your body with warm water from the neck down.  8.  DO NOT shower/wash with your normal soap after using and rinsing off  the CHG Soap.                9.  Pat yourself dry with a clean towel.            10.  Wear clean pajamas.            11.  Place clean sheets on your bed the night of your first shower and do not  sleep with pets. Day of Surgery : Do not apply any lotions/deodorants the morning of surgery.  Please wear clean clothes to the hospital/surgery center.  FAILURE TO FOLLOW THESE INSTRUCTIONS MAY RESULT IN THE CANCELLATION OF YOUR SURGERY PATIENT SIGNATURE_________________________________  NURSE SIGNATURE__________________________________  ________________________________________________________________________   WHAT IS A BLOOD  TRANSFUSION? Blood Transfusion Information  A transfusion is the replacement of blood or some of its parts. Blood is made up of multiple cells which provide different functions.  Red blood cells carry oxygen and are used for blood loss replacement.  White blood cells fight against infection.  Platelets control bleeding.  Plasma helps clot blood.  Other blood products are available for specialized needs, such as hemophilia or other clotting disorders. BEFORE THE TRANSFUSION  Who gives blood for transfusions?   Healthy volunteers who are fully evaluated to make sure their blood is safe. This is blood bank blood. Transfusion therapy is the safest it has ever been in the practice of medicine. Before blood is taken from a donor, a complete history is taken to make sure that person has no history of diseases nor engages in risky social behavior (examples are intravenous drug use or sexual activity with multiple partners). The donor's travel history is screened to minimize risk of transmitting infections, such as malaria. The donated blood is tested for signs of infectious diseases, such as HIV and hepatitis. The blood is then tested to be sure it is compatible with you in order to minimize the chance of a transfusion reaction. If you or a relative donates blood, this is often done in anticipation of surgery and is not appropriate for emergency situations. It takes many days to process the donated blood. RISKS AND COMPLICATIONS Although transfusion therapy is very safe and saves many lives, the main dangers of transfusion include:  1. Getting an infectious disease. 2. Developing a transfusion reaction. This is an allergic reaction to  something in the blood you were given. Every precaution is taken to prevent this. The decision to have a blood transfusion has been considered carefully by your caregiver before blood is given. Blood is not given unless the benefits outweigh the risks. AFTER THE  TRANSFUSION  Right after receiving a blood transfusion, you will usually feel much better and more energetic. This is especially true if your red blood cells have gotten low (anemic). The transfusion raises the level of the red blood cells which carry oxygen, and this usually causes an energy increase.  The nurse administering the transfusion will monitor you carefully for complications. HOME CARE INSTRUCTIONS  No special instructions are needed after a transfusion. You may find your energy is better. Speak with your caregiver about any limitations on activity for underlying diseases you may have. SEEK MEDICAL CARE IF:   Your condition is not improving after your transfusion.  You develop redness or irritation at the intravenous (IV) site. SEEK IMMEDIATE MEDICAL CARE IF:  Any of the following symptoms occur over the next 12 hours:  Shaking chills.  You have a temperature by mouth above 102 F (38.9 C), not controlled by medicine.  Chest, back, or muscle pain.  People around you feel you are not acting correctly or are confused.  Shortness of breath or difficulty breathing.  Dizziness and fainting.  You get a rash or develop hives.  You have a decrease in urine output.  Your urine turns a dark color or changes to pink, red, or brown. Any of the following symptoms occur over the next 10 days:  You have a temperature by mouth above 102 F (38.9 C), not controlled by medicine.  Shortness of breath.  Weakness after normal activity.  The white part of the eye turns yellow (jaundice).  You have a decrease in the amount of urine or are urinating less often.  Your urine turns a dark color or changes to pink, red, or brown. Document Released: 11/20/2000 Document Revised: 02/15/2012 Document Reviewed: 07/09/2008 ExitCare Patient Information 2014 [place].  _______________________________________________________________________  Incentive Spirometer  An incentive  spirometer is a tool that can help keep your lungs clear and active. This tool measures how well you are filling your lungs with each breath. Taking long deep breaths may help reverse or decrease the chance of developing breathing (pulmonary) problems (especially infection) following:  A long period of time when you are unable to move or be active. BEFORE THE PROCEDURE   If the spirometer includes an indicator to show your best effort, your nurse or respiratory therapist will set it to a desired goal.  If possible, sit up straight or lean slightly forward. Try not to slouch.  Hold the incentive spirometer in an upright position. INSTRUCTIONS FOR USE  3. Sit on the edge of your bed if possible, or sit up as far as you can in bed or on a chair. 4. Hold the incentive spirometer in an upright position. 5. Breathe out normally. 6. Place the mouthpiece in your mouth and seal your lips tightly around it. 7. Breathe in slowly and as deeply as possible, raising the piston or the ball toward the top of the column. 8. Hold your breath for 3-5 seconds or for as long as possible. Allow the piston or ball to fall to the bottom of the column. 9. Remove the mouthpiece from your mouth and breathe out normally. 10. Rest for a few seconds and repeat Steps 1 through 7 at least 10  times every 1-2 hours when you are awake. Take your time and take a few normal breaths between deep breaths. 11. The spirometer may include an indicator to show your best effort. Use the indicator as a goal to work toward during each repetition. 12. After each set of 10 deep breaths, practice coughing to be sure your lungs are clear. If you have an incision (the cut made at the time of surgery), support your incision when coughing by placing a pillow or rolled up towels firmly against it. Once you are able to get out of bed, walk around indoors and cough well. You may stop using the incentive spirometer when instructed by your caregiver.   RISKS AND COMPLICATIONS  Take your time so you do not get dizzy or light-headed.  If you are in pain, you may need to take or ask for pain medication before doing incentive spirometry. It is harder to take a deep breath if you are having pain. AFTER USE  Rest and breathe slowly and easily.  It can be helpful to keep track of a log of your progress. Your caregiver can provide you with a simple table to help with this. If you are using the spirometer at home, follow these instructions: [place] IF:   You are having difficultly using the spirometer.  You have trouble using the spirometer as often as instructed.  Your pain medication is not giving enough relief while using the spirometer.  You develop fever of 100.5 F (38.1 C) or higher. SEEK IMMEDIATE MEDICAL CARE IF:   You cough up bloody sputum that had not been present before.  You develop fever of 102 F (38.9 C) or greater.  You develop worsening pain at or near the incision site. MAKE SURE YOU:   Understand these instructions.  Will watch your condition.  Will get help right away if you are not doing well or get worse. Document Released: 04/05/2007 Document Revised: 02/15/2012 Document Reviewed: 06/06/2007 [facility] Patient Information 2014 [place], [place].   ________________________________________________________________________

## 2015-08-06 ENCOUNTER — Encounter (HOSPITAL_COMMUNITY)
Admission: RE | Admit: 2015-08-06 | Discharge: 2015-08-06 | Disposition: A | Discharge status: Home / Self Care | Payer: Medicare Other | Source: Ambulatory Visit | Attending: Orthopedic Surgery | Admitting: Orthopedic Surgery

## 2015-08-06 ENCOUNTER — Encounter (HOSPITAL_COMMUNITY): Payer: Self-pay

## 2015-08-06 DIAGNOSIS — M179 Osteoarthritis of knee, unspecified: Secondary | ICD-10-CM | POA: Diagnosis not present

## 2015-08-06 DIAGNOSIS — Z01818 Encounter for other preprocedural examination: Secondary | ICD-10-CM | POA: Diagnosis not present

## 2015-08-06 HISTORY — DX: Unspecified visual loss: H54.7

## 2015-08-06 HISTORY — DX: Unspecified dementia, unspecified severity, without behavioral disturbance, psychotic disturbance, mood disturbance, and anxiety: F03.90

## 2015-08-06 HISTORY — DX: Anxiety disorder, unspecified: F41.9

## 2015-08-06 HISTORY — DX: Other abnormalities of gait and mobility: R26.89

## 2015-08-06 HISTORY — DX: Major depressive disorder, single episode, unspecified: F32.9

## 2015-08-06 HISTORY — DX: Depression, unspecified: F32.A

## 2015-08-06 HISTORY — DX: Gastro-esophageal reflux disease without esophagitis: K21.9

## 2015-08-06 HISTORY — DX: Acute myocardial infarction, unspecified: I21.9

## 2015-08-06 HISTORY — DX: Unspecified asthma, uncomplicated: J45.909

## 2015-08-06 LAB — CBC
HEMATOCRIT: 33.7 % — AB (ref 36.0–46.0)
Hemoglobin: 11 g/dL — ABNORMAL LOW (ref 12.0–15.0)
MCH: 28.8 pg (ref 26.0–34.0)
MCHC: 32.6 g/dL (ref 30.0–36.0)
MCV: 88.2 fL (ref 78.0–100.0)
PLATELETS: 224 10*3/uL (ref 150–400)
RBC: 3.82 MIL/uL — ABNORMAL LOW (ref 3.87–5.11)
RDW: 14.6 % (ref 11.5–15.5)
WBC: 5.9 10*3/uL (ref 4.0–10.5)

## 2015-08-06 LAB — PROTIME-INR
INR: 1.09 (ref 0.00–1.49)
Prothrombin Time: 14.3 seconds (ref 11.6–15.2)

## 2015-08-06 LAB — URINALYSIS, ROUTINE W REFLEX MICROSCOPIC
Glucose, UA: NEGATIVE mg/dL
Ketones, ur: NEGATIVE mg/dL
NITRITE: NEGATIVE
PROTEIN: 100 mg/dL — AB
SPECIFIC GRAVITY, URINE: 1.02 (ref 1.005–1.030)
UROBILINOGEN UA: 1 mg/dL (ref 0.0–1.0)
pH: 7.5 (ref 5.0–8.0)

## 2015-08-06 LAB — URINE MICROSCOPIC-ADD ON

## 2015-08-06 LAB — BASIC METABOLIC PANEL
Anion gap: 11 (ref 5–15)
BUN: 27 mg/dL — AB (ref 6–20)
CHLORIDE: 106 mmol/L (ref 101–111)
CO2: 25 mmol/L (ref 22–32)
CREATININE: 1.41 mg/dL — AB (ref 0.44–1.00)
Calcium: 9.8 mg/dL (ref 8.9–10.3)
GFR calc Af Amer: 39 mL/min — ABNORMAL LOW (ref >60)
GFR calc non Af Amer: 34 mL/min — ABNORMAL LOW (ref >60)
GLUCOSE: 58 mg/dL — AB (ref 65–99)
POTASSIUM: 5.2 mmol/L — AB (ref 3.5–5.1)
SODIUM: 142 mmol/L (ref 135–145)

## 2015-08-06 LAB — APTT: APTT: 28 s (ref 24–37)

## 2015-08-06 LAB — ABO/RH: ABO/RH(D): A POS

## 2015-08-06 LAB — SURGICAL PCR SCREEN
MRSA, PCR: NEGATIVE
Staphylococcus aureus: NEGATIVE

## 2015-08-06 NOTE — Progress Notes (Signed)
EKG 06/12/15 on EPIC, stress test with cardiac clearance 06/14/15 on EPIC, chest x-ray 03/08/15 on EPIC, ECHO 03/07/15 on EPIC, LOV note Dr. Percival Spanish 06/12/15 on EPIC

## 2015-08-06 NOTE — Progress Notes (Signed)
   08/06/15 1507  OBSTRUCTIVE SLEEP APNEA  Have you ever been diagnosed with sleep apnea through a sleep study? No  Do you snore loudly (loud enough to be heard through closed doors)?  1  Do you often feel tired, fatigued, or sleepy during the daytime? 1  Has anyone observed you stop breathing during your sleep? 1 (not often-with allergies)  Do you have, or are you being treated for high blood pressure? 1  BMI more than 35 kg/m2? 0  Age over 79 years old? 1  Neck circumference greater than 40 cm/16 inches? 0  Gender: 0

## 2015-08-06 NOTE — Progress Notes (Signed)
Pt signed consent for left knee. When measuring for teds, pt stated "measure the knee that they will do surgery on-the right knee." When asked again what knee is going to have surgery the patient stated right knee.  Left message with Orson Slick at Dr. Aurea Graff office to clarify/ change consent.

## 2015-08-07 ENCOUNTER — Other Ambulatory Visit: Payer: Self-pay

## 2015-08-07 NOTE — Telephone Encounter (Signed)
Last seen 07/23/15  Dr Sabra Heck  If approved route to nurse to call into [place]   [phone]

## 2015-08-07 NOTE — H&P (Signed)
TOTAL KNEE ADMISSION H&P  Patient is being admitted for right total knee arthroplasty.  Subjective:  Chief Complaint:    Bilaterally knee primary OA / pain.  HPI: [name], 81 y.o. female, has a history of pain and functional disability in the bilateral knees due to arthritis and has failed non-surgical conservative treatments for greater than 12 weeks to include NSAID's and/or analgesics, corticosteriod injections, use of assistive devices and activity modification.  Onset of symptoms was gradual, starting 2+ years ago with gradually worsening course since that time. The patient noted no past surgery on the right knee(s).  Patient currently rates pain in the right knee(s) at 9 out of 10 with activity. Patient has night pain, worsening of pain with activity and weight bearing, pain that interferes with activities of daily living, pain with passive range of motion, crepitus and joint swelling.  Patient has evidence of periarticular osteophytes and joint space narrowing by imaging studies. There is no active infection.  Risks, benefits and expectations were discussed with the patient.  Risks including but not limited to the risk of anesthesia, blood clots, nerve damage, blood vessel damage, failure of the prosthesis, infection and up to and including death.  Patient understand the risks, benefits and expectations and wishes to proceed with surgery.   PCP: Sharion Balloon, FNP  D/C Plans:      SNF  Post-op Meds:       No Rx given  Tranexamic Acid:      To be given - topically  (CAD)  Decadron:      Is to be given  FYI:     ASA post-op  Norco post-op    Patient Active Problem List   Diagnosis Date Noted  . Elevated troponin 03/06/2015  . Fever   . History of diabetes mellitus   . Left buttock pain   . Hyperlipidemia 01/11/2015  . GAD (generalized anxiety disorder) 01/11/2015  . Depression 01/11/2015  . GERD (gastroesophageal reflux disease) 01/11/2015  . Osteopenia 07/13/2014   . Obesity (BMI 30-39.9) 06/19/2014  . Carotid stenosis   . Anemia of chronic disease 12/25/2011  . Iron deficiency anemia 05/27/2011  . ACUTE DIASTOLIC HEART FAILURE 123456  . Type 2 diabetes mellitus with insulin therapy 10/08/2009  . DYSLIPIDEMIA 10/08/2009  . Essential hypertension 10/08/2009  . Coronary atherosclerosis 10/08/2009  . CAROTID STENOSIS 10/08/2009   Past Medical History  Diagnosis Date  . Anemia   . Hypertension   . Iron deficiency anemia 05/27/2011  . CHF (congestive heart failure)     EF preserved Echo 2012  . 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 Carotid stenosis   . Renal insufficiency   . Furuncle of back, except buttock   . Vertigo     "when sugar gets low"  . CAD (coronary artery disease)   . Chronic kidney infection   . Arthritis     back, knees, and hips  . Myocardial infarction   . Balance problems   . Speech problem     from stroke  . Vision loss     left eye-"half blind"  . Hearing loss   . Shortness of breath dyspnea     with activity  . H/O seasonal allergies   . Pneumonia     hx of  . Asthma     with allergies  . Swallowing difficulty   . Diabetes mellitus  type 2  . Anxiety   . Depression   . GERD (gastroesophageal reflux disease)   . Dementia     "a little"    Past Surgical History  Procedure Laterality Date  . I & d of furuncle  April 2013  . Coronary artery bypass graft  2006  . Coronary angioplasty      prior to 2006 5 stents  . Total abdominal hysterectomy  ~81 years old    complete, with tumor removal  . Cholecystectomy    . Appendectomy      with hysterectomy  . Cataract extraction Bilateral 5 years ago    No prescriptions prior to admission   Allergies  Allergen Reactions  . Heparin Other (See Comments)    Confusion  . Propoxyphene N-Acetaminophen Other (See Comments)    Numbness all over. "floating" sensation.     Social History  Substance Use Topics  . Smoking status: Never Smoker   . Smokeless tobacco: Never Used  . Alcohol Use: No    Family History  Problem Relation Age of Onset  . Cancer Brother     porstate  . Early death Sister   . Heart disease Brother   . Heart disease Brother   . Heart attack Brother   . Heart disease Brother   . Heart attack Brother   . Diabetes Sister   . Heart attack Sister   . Diabetes Sister   . Osteoporosis Sister   . Hypertension Sister      Review of Systems  Constitutional: Positive for malaise/fatigue.  HENT: Positive for hearing loss and tinnitus.   Eyes: Negative.   Respiratory: Negative.   Cardiovascular: Negative.   Gastrointestinal: Positive for heartburn, diarrhea and constipation.  Genitourinary: Positive for urgency and frequency.  Musculoskeletal: Positive for back pain and joint pain.  Skin: Negative.   Neurological: Positive for headaches.  Endo/Heme/Allergies: Negative.   Psychiatric/Behavioral: Positive for depression. The patient is nervous/anxious.     Objective:  Physical Exam  Constitutional: She appears well-developed.  HENT:  Head: Normocephalic.  Mouth/Throat: She has dentures.  Eyes: Pupils are equal, round, and reactive to light.  Neck: Neck supple. No JVD present. No tracheal deviation present. No thyromegaly present.  Cardiovascular: Normal rate, regular rhythm, normal heart sounds and intact distal pulses.   Respiratory: Effort normal and breath sounds normal. No stridor. No respiratory distress. She has no wheezes.  GI: Soft. There is no tenderness. There is no guarding.  Musculoskeletal:       Right knee: She exhibits decreased range of motion, swelling and bony tenderness. She exhibits no ecchymosis, no deformity, no laceration and no erythema. Tenderness found.       Left knee: She exhibits decreased range of motion, swelling and bony tenderness. She exhibits no ecchymosis, no deformity, no laceration and  no erythema. Tenderness found.  Lymphadenopathy:    She has no cervical adenopathy.  Neurological: She is alert. A sensory deficit (bilateral LE neuropathy) is present.  Skin: Skin is warm and dry.  Psychiatric: She has a normal mood and affect.      Labs:  Estimated body mass index is 29.39 kg/(m^2) as calculated from the following:   Height as of 07/23/15: 5\' 6"  (1.676 m).   Weight as of 07/23/15: 82.555 kg (182 lb).   Imaging Review Plain radiographs demonstrate severe degenerative joint disease of the bilaterally knee(s). The overall alignment is  neutral. The bone quality appears to be good for age and reported activity  level.  Assessment/Plan:  End stage arthritis, bilaterally knee (right worse than left)  The patient history, physical examination, clinical judgment of the provider and imaging studies are consistent with end stage degenerative joint disease of the right knee(s) and total knee arthroplasty is deemed medically necessary. The treatment options including medical management, injection therapy arthroscopy and arthroplasty were discussed at length. The risks and benefits of total knee arthroplasty were presented and reviewed. The risks due to aseptic loosening, infection, stiffness, patella tracking problems, thromboembolic complications and other imponderables were discussed. The patient acknowledged the explanation, agreed to proceed with the plan and consent was signed. Patient is being admitted for inpatient treatment for surgery, pain control, PT, OT, prophylactic antibiotics, VTE prophylaxis, progressive ambulation and ADL's and discharge planning. The patient is planning to be discharged to skilled nursing facility.    [place] Alitza Cowman   PA-C  08/07/2015, 2:24 PM

## 2015-08-08 MED ORDER — LORAZEPAM 0.5 MG PO TABS: ORAL_TABLET | ORAL | Status: DC

## 2015-08-08 NOTE — Telephone Encounter (Signed)
Medication called into pharmacy, patient notified.

## 2015-08-12 LAB — TYPE AND SCREEN
ABO/RH(D): A POS
Antibody Screen: NEGATIVE

## 2015-08-13 ENCOUNTER — Encounter (HOSPITAL_COMMUNITY)
Admission: RE | Disposition: A | Discharge status: Skilled Nursing Facility | Payer: Self-pay | Source: Ambulatory Visit | Attending: Orthopedic Surgery

## 2015-08-13 ENCOUNTER — Inpatient Hospital Stay (HOSPITAL_COMMUNITY): Payer: Medicare Other | Admitting: Certified Registered Nurse Anesthetist

## 2015-08-13 ENCOUNTER — Inpatient Hospital Stay (HOSPITAL_COMMUNITY)
Admission: RE | Admit: 2015-08-13 | Discharge: 2015-08-15 | DRG: 470 | Disposition: A | Discharge status: Skilled Nursing Facility | Payer: Medicare Other | Source: Ambulatory Visit | Attending: Orthopedic Surgery | Admitting: Orthopedic Surgery

## 2015-08-13 ENCOUNTER — Encounter (HOSPITAL_COMMUNITY): Payer: Self-pay | Admitting: Certified Registered Nurse Anesthetist

## 2015-08-13 DIAGNOSIS — Z6829 Body mass index (BMI) 29.0-29.9, adult: Secondary | ICD-10-CM | POA: Diagnosis not present

## 2015-08-13 DIAGNOSIS — E785 Hyperlipidemia, unspecified: Secondary | ICD-10-CM | POA: Diagnosis not present

## 2015-08-13 DIAGNOSIS — F411 Generalized anxiety disorder: Secondary | ICD-10-CM | POA: Diagnosis not present

## 2015-08-13 DIAGNOSIS — R278 Other lack of coordination: Secondary | ICD-10-CM | POA: Diagnosis not present

## 2015-08-13 DIAGNOSIS — D649 Anemia, unspecified: Secondary | ICD-10-CM | POA: Diagnosis not present

## 2015-08-13 DIAGNOSIS — M170 Bilateral primary osteoarthritis of knee: Secondary | ICD-10-CM | POA: Diagnosis not present

## 2015-08-13 DIAGNOSIS — Z90710 Acquired absence of both cervix and uterus: Secondary | ICD-10-CM

## 2015-08-13 DIAGNOSIS — M659 Synovitis and tenosynovitis, unspecified: Secondary | ICD-10-CM | POA: Diagnosis not present

## 2015-08-13 DIAGNOSIS — I69398 Other sequelae of cerebral infarction: Secondary | ICD-10-CM | POA: Diagnosis not present

## 2015-08-13 DIAGNOSIS — M8580 Other specified disorders of bone density and structure, unspecified site: Secondary | ICD-10-CM | POA: Diagnosis not present

## 2015-08-13 DIAGNOSIS — Z471 Aftercare following joint replacement surgery: Secondary | ICD-10-CM | POA: Diagnosis not present

## 2015-08-13 DIAGNOSIS — I5032 Chronic diastolic (congestive) heart failure: Secondary | ICD-10-CM | POA: Diagnosis not present

## 2015-08-13 DIAGNOSIS — H538 Other visual disturbances: Secondary | ICD-10-CM | POA: Diagnosis not present

## 2015-08-13 DIAGNOSIS — F329 Major depressive disorder, single episode, unspecified: Secondary | ICD-10-CM | POA: Diagnosis not present

## 2015-08-13 DIAGNOSIS — Z96651 Presence of right artificial knee joint: Secondary | ICD-10-CM

## 2015-08-13 DIAGNOSIS — Z9981 Dependence on supplemental oxygen: Secondary | ICD-10-CM | POA: Diagnosis not present

## 2015-08-13 DIAGNOSIS — E669 Obesity, unspecified: Secondary | ICD-10-CM | POA: Diagnosis not present

## 2015-08-13 DIAGNOSIS — M179 Osteoarthritis of knee, unspecified: Secondary | ICD-10-CM | POA: Diagnosis not present

## 2015-08-13 DIAGNOSIS — F0390 Unspecified dementia without behavioral disturbance: Secondary | ICD-10-CM | POA: Diagnosis present

## 2015-08-13 DIAGNOSIS — K219 Gastro-esophageal reflux disease without esophagitis: Secondary | ICD-10-CM | POA: Diagnosis not present

## 2015-08-13 DIAGNOSIS — I252 Old myocardial infarction: Secondary | ICD-10-CM | POA: Diagnosis not present

## 2015-08-13 DIAGNOSIS — Z01812 Encounter for preprocedural laboratory examination: Secondary | ICD-10-CM

## 2015-08-13 DIAGNOSIS — Z96659 Presence of unspecified artificial knee joint: Secondary | ICD-10-CM

## 2015-08-13 DIAGNOSIS — M25561 Pain in right knee: Secondary | ICD-10-CM | POA: Diagnosis present

## 2015-08-13 DIAGNOSIS — H9190 Unspecified hearing loss, unspecified ear: Secondary | ICD-10-CM | POA: Diagnosis present

## 2015-08-13 DIAGNOSIS — I10 Essential (primary) hypertension: Secondary | ICD-10-CM | POA: Diagnosis not present

## 2015-08-13 DIAGNOSIS — Z794 Long term (current) use of insulin: Secondary | ICD-10-CM

## 2015-08-13 DIAGNOSIS — M6281 Muscle weakness (generalized): Secondary | ICD-10-CM | POA: Diagnosis not present

## 2015-08-13 DIAGNOSIS — R262 Difficulty in walking, not elsewhere classified: Secondary | ICD-10-CM | POA: Diagnosis not present

## 2015-08-13 DIAGNOSIS — E1369 Other specified diabetes mellitus with other specified complication: Secondary | ICD-10-CM | POA: Diagnosis not present

## 2015-08-13 DIAGNOSIS — Z951 Presence of aortocoronary bypass graft: Secondary | ICD-10-CM | POA: Diagnosis not present

## 2015-08-13 DIAGNOSIS — I2510 Atherosclerotic heart disease of native coronary artery without angina pectoris: Secondary | ICD-10-CM | POA: Diagnosis not present

## 2015-08-13 DIAGNOSIS — M1711 Unilateral primary osteoarthritis, right knee: Secondary | ICD-10-CM | POA: Diagnosis not present

## 2015-08-13 DIAGNOSIS — E119 Type 2 diabetes mellitus without complications: Secondary | ICD-10-CM | POA: Diagnosis not present

## 2015-08-13 HISTORY — PX: TOTAL KNEE ARTHROPLASTY: SHX125

## 2015-08-13 LAB — GLUCOSE, CAPILLARY
GLUCOSE-CAPILLARY: 186 mg/dL — AB (ref 65–99)
GLUCOSE-CAPILLARY: 206 mg/dL — AB (ref 65–99)
GLUCOSE-CAPILLARY: 360 mg/dL — AB (ref 65–99)
Glucose-Capillary: 197 mg/dL — ABNORMAL HIGH (ref 65–99)
Glucose-Capillary: 192 mg/dL — ABNORMAL HIGH (ref 65–99)
Glucose-Capillary: 365 mg/dL — ABNORMAL HIGH (ref 65–99)

## 2015-08-13 SURGERY — ARTHROPLASTY, KNEE, TOTAL
Anesthesia: Spinal | Site: Knee | Laterality: Right

## 2015-08-13 MED ORDER — METHOCARBAMOL 500 MG PO TABS
500.0000 mg | ORAL_TABLET | Freq: Four times a day (QID) | ORAL | Status: DC | PRN
  Administered 2015-08-13 – 2015-08-14 (×2): 500 mg via ORAL
  Filled 2015-08-13 (×3): qty 1

## 2015-08-13 MED ORDER — ISOSORBIDE MONONITRATE ER 30 MG PO TB24
30.0000 mg | ORAL_TABLET | Freq: Every day | ORAL | Status: DC
  Administered 2015-08-14 – 2015-08-15 (×2): 30 mg via ORAL
  Filled 2015-08-13 (×2): qty 1

## 2015-08-13 MED ORDER — METOPROLOL TARTRATE 50 MG PO TABS
50.0000 mg | ORAL_TABLET | Freq: Two times a day (BID) | ORAL | Status: DC
  Administered 2015-08-13 – 2015-08-15 (×4): 50 mg via ORAL
  Filled 2015-08-13 (×5): qty 1

## 2015-08-13 MED ORDER — METHOCARBAMOL 1000 MG/10ML IJ SOLN
500.0000 mg | Freq: Four times a day (QID) | INTRAVENOUS | Status: DC | PRN
  Filled 2015-08-13: qty 5

## 2015-08-13 MED ORDER — ALUM & MAG HYDROXIDE-SIMETH 200-200-20 MG/5ML PO SUSP: 30.0000 mL | ORAL | Status: DC | PRN

## 2015-08-13 MED ORDER — DEXAMETHASONE SODIUM PHOSPHATE 10 MG/ML IJ SOLN
10.0000 mg | Freq: Once | INTRAMUSCULAR | Status: DC
  Filled 2015-08-13: qty 1

## 2015-08-13 MED ORDER — DOCUSATE SODIUM 100 MG PO CAPS
100.0000 mg | ORAL_CAPSULE | Freq: Two times a day (BID) | ORAL | Status: DC
  Administered 2015-08-13 – 2015-08-15 (×4): 100 mg via ORAL
  Filled 2015-08-13 (×5): qty 1

## 2015-08-13 MED ORDER — FENTANYL CITRATE (PF) 100 MCG/2ML IJ SOLN
INTRAMUSCULAR | Status: DC | PRN
  Administered 2015-08-13 (×2): 50 ug via INTRAVENOUS

## 2015-08-13 MED ORDER — PAROXETINE HCL 20 MG PO TABS
20.0000 mg | ORAL_TABLET | Freq: Every day | ORAL | Status: DC
  Administered 2015-08-14 – 2015-08-15 (×2): 20 mg via ORAL
  Filled 2015-08-13 (×2): qty 1

## 2015-08-13 MED ORDER — ONDANSETRON HCL 4 MG PO TABS: 4.0000 mg | ORAL_TABLET | Freq: Four times a day (QID) | ORAL | Status: DC | PRN

## 2015-08-13 MED ORDER — ONDANSETRON HCL 4 MG/2ML IJ SOLN
INTRAMUSCULAR | Status: DC | PRN
  Administered 2015-08-13: 4 mg via INTRAVENOUS

## 2015-08-13 MED ORDER — FERROUS SULFATE 325 (65 FE) MG PO TABS
325.0000 mg | ORAL_TABLET | Freq: Three times a day (TID) | ORAL | Status: DC
  Administered 2015-08-14 – 2015-08-15 (×4): 325 mg via ORAL
  Filled 2015-08-13 (×7): qty 1

## 2015-08-13 MED ORDER — KETOROLAC TROMETHAMINE 30 MG/ML IJ SOLN
INTRAMUSCULAR | Status: AC
  Filled 2015-08-13: qty 1

## 2015-08-13 MED ORDER — POLYETHYLENE GLYCOL 3350 17 G PO PACK
17.0000 g | PACK | Freq: Two times a day (BID) | ORAL | Status: DC
  Administered 2015-08-14 – 2015-08-15 (×2): 17 g via ORAL
  Filled 2015-08-13 (×5): qty 1

## 2015-08-13 MED ORDER — BISACODYL 10 MG RE SUPP: 10.0000 mg | Freq: Every day | RECTAL | Status: DC | PRN

## 2015-08-13 MED ORDER — TRANEXAMIC ACID 1000 MG/10ML IV SOLN
2000.0000 mg | Freq: Once | INTRAVENOUS | Status: DC
  Filled 2015-08-13: qty 20

## 2015-08-13 MED ORDER — METFORMIN HCL ER 500 MG PO TB24
500.0000 mg | ORAL_TABLET | Freq: Two times a day (BID) | ORAL | Status: DC
  Administered 2015-08-13 – 2015-08-14 (×2): 500 mg via ORAL
  Filled 2015-08-13 (×4): qty 1

## 2015-08-13 MED ORDER — LIDOCAINE HCL (CARDIAC) 20 MG/ML IV SOLN
INTRAVENOUS | Status: DC | PRN
  Administered 2015-08-13: 25 mg via INTRAVENOUS

## 2015-08-13 MED ORDER — DIPHENHYDRAMINE HCL 25 MG PO CAPS: 25.0000 mg | ORAL_CAPSULE | Freq: Four times a day (QID) | ORAL | Status: DC | PRN

## 2015-08-13 MED ORDER — SODIUM CHLORIDE 0.9 % IJ SOLN
INTRAMUSCULAR | Status: DC | PRN
  Administered 2015-08-13: 30 mL

## 2015-08-13 MED ORDER — CEFAZOLIN SODIUM-DEXTROSE 2-3 GM-% IV SOLR
2.0000 g | INTRAVENOUS | Status: AC
  Administered 2015-08-13: 2 g via INTRAVENOUS

## 2015-08-13 MED ORDER — BUPIVACAINE-EPINEPHRINE 0.25% -1:200000 IJ SOLN
INTRAMUSCULAR | Status: AC
  Filled 2015-08-13: qty 1

## 2015-08-13 MED ORDER — ASPIRIN EC 325 MG PO TBEC
325.0000 mg | DELAYED_RELEASE_TABLET | Freq: Every day | ORAL | Status: DC
  Administered 2015-08-14 – 2015-08-15 (×2): 325 mg via ORAL
  Filled 2015-08-13 (×3): qty 1

## 2015-08-13 MED ORDER — INSULIN NPH (HUMAN) (ISOPHANE) 100 UNIT/ML ~~LOC~~ SUSP
63.0000 [IU] | Freq: Every day | SUBCUTANEOUS | Status: DC
Start: 2015-08-14 — End: 2015-08-15
  Administered 2015-08-14 – 2015-08-15 (×2): 63 [IU] via SUBCUTANEOUS
  Filled 2015-08-13: qty 10

## 2015-08-13 MED ORDER — LIDOCAINE HCL (CARDIAC) 20 MG/ML IV SOLN
INTRAVENOUS | Status: AC
  Filled 2015-08-13: qty 5

## 2015-08-13 MED ORDER — ATORVASTATIN CALCIUM 20 MG PO TABS
20.0000 mg | ORAL_TABLET | Freq: Every day | ORAL | Status: DC
  Administered 2015-08-13 – 2015-08-14 (×2): 20 mg via ORAL
  Filled 2015-08-13 (×3): qty 1

## 2015-08-13 MED ORDER — SODIUM CHLORIDE 0.9 % IJ SOLN
INTRAMUSCULAR | Status: AC
  Filled 2015-08-13: qty 50

## 2015-08-13 MED ORDER — 0.9 % SODIUM CHLORIDE (POUR BTL) OPTIME
TOPICAL | Status: DC | PRN
  Administered 2015-08-13: 1000 mL

## 2015-08-13 MED ORDER — INSULIN ASPART 100 UNIT/ML ~~LOC~~ SOLN
0.0000 [IU] | Freq: Three times a day (TID) | SUBCUTANEOUS | Status: DC
  Administered 2015-08-13 – 2015-08-14 (×2): 5 [IU] via SUBCUTANEOUS
  Administered 2015-08-14: 8 [IU] via SUBCUTANEOUS
  Administered 2015-08-14: 2 [IU] via SUBCUTANEOUS
  Administered 2015-08-15: 3 [IU] via SUBCUTANEOUS

## 2015-08-13 MED ORDER — SODIUM CHLORIDE 0.9 % IR SOLN
Status: DC | PRN
  Administered 2015-08-13: 1000 mL

## 2015-08-13 MED ORDER — MECLIZINE HCL 25 MG PO TABS
25.0000 mg | ORAL_TABLET | Freq: Two times a day (BID) | ORAL | Status: DC
  Administered 2015-08-13 – 2015-08-15 (×4): 25 mg via ORAL
  Filled 2015-08-13 (×5): qty 1

## 2015-08-13 MED ORDER — CEFAZOLIN SODIUM-DEXTROSE 2-3 GM-% IV SOLR
2.0000 g | Freq: Four times a day (QID) | INTRAVENOUS | Status: AC
  Administered 2015-08-13 – 2015-08-14 (×2): 2 g via INTRAVENOUS
  Filled 2015-08-13 (×2): qty 50

## 2015-08-13 MED ORDER — ONDANSETRON HCL 4 MG/2ML IJ SOLN
INTRAMUSCULAR | Status: AC
  Filled 2015-08-13: qty 2

## 2015-08-13 MED ORDER — CEFAZOLIN SODIUM-DEXTROSE 2-3 GM-% IV SOLR
INTRAVENOUS | Status: AC
  Filled 2015-08-13: qty 50

## 2015-08-13 MED ORDER — METOCLOPRAMIDE HCL 5 MG/ML IJ SOLN: 5.0000 mg | Freq: Three times a day (TID) | INTRAMUSCULAR | Status: DC | PRN

## 2015-08-13 MED ORDER — AMLODIPINE BESYLATE 10 MG PO TABS
10.0000 mg | ORAL_TABLET | Freq: Every day | ORAL | Status: DC
  Administered 2015-08-14 – 2015-08-15 (×2): 10 mg via ORAL
  Filled 2015-08-13 (×2): qty 1

## 2015-08-13 MED ORDER — MAGNESIUM CITRATE PO SOLN: 1.0000 | Freq: Once | ORAL | Status: DC | PRN

## 2015-08-13 MED ORDER — CLOPIDOGREL BISULFATE 75 MG PO TABS
75.0000 mg | ORAL_TABLET | Freq: Every day | ORAL | Status: DC
  Administered 2015-08-14 – 2015-08-15 (×2): 75 mg via ORAL
  Filled 2015-08-13 (×3): qty 1

## 2015-08-13 MED ORDER — BUPIVACAINE IN DEXTROSE 0.75-8.25 % IT SOLN
INTRATHECAL | Status: DC | PRN
  Administered 2015-08-13: 11.25 mg via INTRATHECAL

## 2015-08-13 MED ORDER — MENTHOL 3 MG MT LOZG
1.0000 | LOZENGE | OROMUCOSAL | Status: DC | PRN
  Filled 2015-08-13: qty 9

## 2015-08-13 MED ORDER — PHENOL 1.4 % MT LIQD: 1.0000 | OROMUCOSAL | Status: DC | PRN

## 2015-08-13 MED ORDER — HYDROMORPHONE HCL 1 MG/ML IJ SOLN: 0.5000 mg | INTRAMUSCULAR | Status: DC | PRN

## 2015-08-13 MED ORDER — PROPOFOL INFUSION 10 MG/ML OPTIME
INTRAVENOUS | Status: DC | PRN
  Administered 2015-08-13: 50 ug/kg/min via INTRAVENOUS

## 2015-08-13 MED ORDER — DEXAMETHASONE SODIUM PHOSPHATE 10 MG/ML IJ SOLN
INTRAMUSCULAR | Status: AC
  Filled 2015-08-13: qty 1

## 2015-08-13 MED ORDER — BUPIVACAINE-EPINEPHRINE (PF) 0.25% -1:200000 IJ SOLN
INTRAMUSCULAR | Status: DC | PRN
  Administered 2015-08-13: 30 mL

## 2015-08-13 MED ORDER — PROPOFOL 10 MG/ML IV BOLUS
INTRAVENOUS | Status: AC
  Filled 2015-08-13: qty 20

## 2015-08-13 MED ORDER — SODIUM CHLORIDE 0.9 % IV SOLN
INTRAVENOUS | Status: DC
  Administered 2015-08-13: 19:00:00 via INTRAVENOUS
  Filled 2015-08-13 (×3): qty 1000

## 2015-08-13 MED ORDER — PANTOPRAZOLE SODIUM 40 MG PO TBEC
80.0000 mg | DELAYED_RELEASE_TABLET | Freq: Every day | ORAL | Status: DC
  Administered 2015-08-14 – 2015-08-15 (×2): 80 mg via ORAL
  Filled 2015-08-13 (×4): qty 2

## 2015-08-13 MED ORDER — KETOROLAC TROMETHAMINE 30 MG/ML IJ SOLN
INTRAMUSCULAR | Status: DC | PRN
  Administered 2015-08-13: 30 mg via INTRAVENOUS

## 2015-08-13 MED ORDER — HYDRALAZINE HCL 20 MG/ML IJ SOLN
3.0000 mg | Freq: Once | INTRAMUSCULAR | Status: AC
  Administered 2015-08-13: 3 mg via INTRAVENOUS

## 2015-08-13 MED ORDER — HYDRALAZINE HCL 20 MG/ML IJ SOLN
INTRAMUSCULAR | Status: AC
  Administered 2015-08-13: 3 mg via INTRAVENOUS
  Filled 2015-08-13: qty 1

## 2015-08-13 MED ORDER — ONDANSETRON HCL 4 MG/2ML IJ SOLN: 4.0000 mg | Freq: Four times a day (QID) | INTRAMUSCULAR | Status: DC | PRN

## 2015-08-13 MED ORDER — HYDROCODONE-ACETAMINOPHEN 7.5-325 MG PO TABS
1.0000 | ORAL_TABLET | ORAL | Status: DC
  Administered 2015-08-13 – 2015-08-15 (×8): 1 via ORAL
  Filled 2015-08-13 (×8): qty 1
  Filled 2015-08-13: qty 2

## 2015-08-13 MED ORDER — NITROGLYCERIN 0.4 MG SL SUBL: 0.4000 mg | SUBLINGUAL_TABLET | SUBLINGUAL | Status: DC | PRN

## 2015-08-13 MED ORDER — TRANEXAMIC ACID 1000 MG/10ML IV SOLN
2000.0000 mg | INTRAVENOUS | Status: DC | PRN
  Administered 2015-08-13: 2000 mg via TOPICAL

## 2015-08-13 MED ORDER — FUROSEMIDE 20 MG PO TABS
20.0000 mg | ORAL_TABLET | Freq: Every day | ORAL | Status: DC | PRN
  Filled 2015-08-13: qty 1

## 2015-08-13 MED ORDER — LORAZEPAM 0.5 MG PO TABS
0.5000 mg | ORAL_TABLET | Freq: Two times a day (BID) | ORAL | Status: DC
  Administered 2015-08-13 – 2015-08-15 (×4): 0.5 mg via ORAL
  Filled 2015-08-13 (×4): qty 1

## 2015-08-13 MED ORDER — CELECOXIB 200 MG PO CAPS
200.0000 mg | ORAL_CAPSULE | Freq: Two times a day (BID) | ORAL | Status: DC
  Administered 2015-08-13 – 2015-08-15 (×4): 200 mg via ORAL
  Filled 2015-08-13 (×5): qty 1

## 2015-08-13 MED ORDER — DEXAMETHASONE SODIUM PHOSPHATE 10 MG/ML IJ SOLN
10.0000 mg | Freq: Once | INTRAMUSCULAR | Status: AC
  Administered 2015-08-13: 10 mg via INTRAVENOUS

## 2015-08-13 MED ORDER — FENTANYL CITRATE (PF) 100 MCG/2ML IJ SOLN
INTRAMUSCULAR | Status: AC
  Filled 2015-08-13: qty 4

## 2015-08-13 MED ORDER — INSULIN ASPART 100 UNIT/ML ~~LOC~~ SOLN
SUBCUTANEOUS | Status: AC
  Administered 2015-08-14: 8 [IU] via SUBCUTANEOUS
  Filled 2015-08-13: qty 1

## 2015-08-13 MED ORDER — METOCLOPRAMIDE HCL 10 MG PO TABS: 5.0000 mg | ORAL_TABLET | Freq: Three times a day (TID) | ORAL | Status: DC | PRN

## 2015-08-13 MED ORDER — LACTATED RINGERS IV SOLN
INTRAVENOUS | Status: DC
  Administered 2015-08-13: 1000 mL via INTRAVENOUS
  Administered 2015-08-13: 15:00:00 via INTRAVENOUS

## 2015-08-13 SURGICAL SUPPLY — 56 items
BAG DECANTER FOR FLEXI CONT (MISCELLANEOUS) IMPLANT
BAG SPEC THK2 15X12 ZIP CLS (MISCELLANEOUS)
BAG ZIPLOCK 12X15 (MISCELLANEOUS) IMPLANT
BANDAGE ELASTIC 6 VELCRO ST LF (GAUZE/BANDAGES/DRESSINGS) ×3 IMPLANT
BANDAGE ESMARK 6X9 LF (GAUZE/BANDAGES/DRESSINGS) ×1 IMPLANT
BLADE SAW SGTL 13.0X1.19X90.0M (BLADE) ×3 IMPLANT
BNDG CMPR 9X6 STRL LF SNTH (GAUZE/BANDAGES/DRESSINGS) ×1
BNDG ESMARK 6X9 LF (GAUZE/BANDAGES/DRESSINGS) ×3
BONE CEMENT GENTAMICIN (Cement) ×6 IMPLANT
BOWL SMART MIX CTS (DISPOSABLE) ×3 IMPLANT
CAP KNEE TOTAL 3 SIGMA ×2 IMPLANT
CEMENT BONE GENTAMICIN 40 (Cement) IMPLANT
CUFF TOURN SGL QUICK 34 (TOURNIQUET CUFF) ×3
CUFF TRNQT CYL 34X4X40X1 (TOURNIQUET CUFF) ×1 IMPLANT
DECANTER SPIKE VIAL GLASS SM (MISCELLANEOUS) ×3 IMPLANT
DRAPE EXTREMITY T 121X128X90 (DRAPE) ×3 IMPLANT
DRAPE POUCH INSTRU U-SHP 10X18 (DRAPES) ×3 IMPLANT
DRAPE U-SHAPE 47X51 STRL (DRAPES) ×3 IMPLANT
DRSG AQUACEL AG ADV 3.5X10 (GAUZE/BANDAGES/DRESSINGS) ×3 IMPLANT
DURAPREP 26ML APPLICATOR (WOUND CARE) ×6 IMPLANT
ELECT REM PT RETURN 9FT ADLT (ELECTROSURGICAL) ×3
ELECTRODE REM PT RTRN 9FT ADLT (ELECTROSURGICAL) ×1 IMPLANT
FACESHIELD WRAPAROUND (MASK) ×15 IMPLANT
FACESHIELD WRAPAROUND OR TEAM (MASK) ×5 IMPLANT
GLOVE BIOGEL PI IND STRL 7.5 (GLOVE) ×1 IMPLANT
GLOVE BIOGEL PI IND STRL 8.5 (GLOVE) ×1 IMPLANT
GLOVE BIOGEL PI INDICATOR 7.5 (GLOVE) ×2
GLOVE BIOGEL PI INDICATOR 8.5 (GLOVE) ×2
GLOVE ECLIPSE 8.0 STRL XLNG CF (GLOVE) ×3 IMPLANT
GLOVE ORTHO TXT STRL SZ7.5 (GLOVE) ×6 IMPLANT
GOWN SPEC L3 XXLG W/TWL (GOWN DISPOSABLE) ×3 IMPLANT
GOWN STRL REUS W/TWL LRG LVL3 (GOWN DISPOSABLE) ×3 IMPLANT
HANDPIECE INTERPULSE COAX TIP (DISPOSABLE) ×3
KIT BASIN OR (CUSTOM PROCEDURE TRAY) ×3 IMPLANT
LIQUID BAND (GAUZE/BANDAGES/DRESSINGS) ×3 IMPLANT
MANIFOLD NEPTUNE II (INSTRUMENTS) ×3 IMPLANT
NDL SAFETY ECLIPSE 18X1.5 (NEEDLE) ×1 IMPLANT
NEEDLE HYPO 18GX1.5 SHARP (NEEDLE) ×6
PACK TOTAL JOINT (CUSTOM PROCEDURE TRAY) ×3 IMPLANT
PEN SKIN MARKING BROAD (MISCELLANEOUS) ×3 IMPLANT
POSITIONER SURGICAL ARM (MISCELLANEOUS) ×3 IMPLANT
SET HNDPC FAN SPRY TIP SCT (DISPOSABLE) ×1 IMPLANT
SET PAD KNEE POSITIONER (MISCELLANEOUS) ×3 IMPLANT
SUCTION FRAZIER 12FR DISP (SUCTIONS) ×3 IMPLANT
SUT MNCRL AB 4-0 PS2 18 (SUTURE) ×3 IMPLANT
SUT VIC AB 1 CT1 36 (SUTURE) ×3 IMPLANT
SUT VIC AB 2-0 CT1 27 (SUTURE) ×9
SUT VIC AB 2-0 CT1 TAPERPNT 27 (SUTURE) ×3 IMPLANT
SUT VLOC 180 0 24IN GS25 (SUTURE) ×3 IMPLANT
SYR 50ML LL SCALE MARK (SYRINGE) ×5 IMPLANT
TOWEL OR 17X26 10 PK STRL BLUE (TOWEL DISPOSABLE) ×3 IMPLANT
TOWEL OR NON WOVEN STRL DISP B (DISPOSABLE) IMPLANT
TRAY FOLEY W/METER SILVER 14FR (SET/KITS/TRAYS/PACK) ×3 IMPLANT
WATER STERILE IRR 1500ML POUR (IV SOLUTION) ×3 IMPLANT
WRAP KNEE MAXI GEL POST OP (GAUZE/BANDAGES/DRESSINGS) ×3 IMPLANT
YANKAUER SUCT BULB TIP 10FT TU (MISCELLANEOUS) ×3 IMPLANT

## 2015-08-13 NOTE — Transfer of Care (Signed)
Immediate Anesthesia Transfer of Care Note  Patient: [name]  Procedure(s) Performed: Procedure(s): RIGHT  TOTAL KNEE ARTHROPLASTY (Right)  Patient Location: PACU  Anesthesia Type:Spinal  Level of Consciousness: awake, alert  and oriented  Airway & Oxygen Therapy: Patient Spontanous Breathing and Patient connected to face mask oxygen  Post-op Assessment: Report given to RN and Post -op Vital signs reviewed and stable  Post vital signs: Reviewed and stable  Last Vitals:  Filed Vitals:   08/13/15 1013  BP: 161/72  Pulse: 59  Temp: 36.9 C  Resp: 16    Complications: No apparent anesthesia complications

## 2015-08-13 NOTE — Anesthesia Procedure Notes (Signed)
Spinal Patient location during procedure: OR Staffing Anesthesiologist: Quandra Fedorchak Performed by: anesthesiologist  Preanesthetic Checklist Completed: patient identified, site marked, surgical consent, pre-op evaluation, timeout performed, IV checked, risks and benefits discussed and monitors and equipment checked Spinal Block Patient position: sitting Prep: Betadine Patient monitoring: heart rate, continuous pulse ox and blood pressure Approach: right paramedian Location: L3-4 Injection technique: single-shot Needle Needle type: Spinocan  Needle gauge: 22 G Needle length: 9 cm Additional Notes Expiration date of kit checked and confirmed. Patient tolerated procedure well, without complications.     

## 2015-08-13 NOTE — Plan of Care (Signed)
Problem: Consults Goal: Diagnosis- Total Joint Replacement Primary Total Knee     

## 2015-08-13 NOTE — Op Note (Signed)
NAME:  [name]                      MEDICAL RECORD NO.:  [id number]                             FACILITY:  [facility]      PHYSICIAN:  Pietro Cassis. Alvan Dame, M.D.  DATE OF BIRTH:  02/20/1931      DATE OF PROCEDURE:  08/13/2015                                     OPERATIVE REPORT         PREOPERATIVE DIAGNOSIS:  Right knee osteoarthritis.      POSTOPERATIVE DIAGNOSIS:  Right knee osteoarthritis.      FINDINGS:  The patient was noted to have complete loss of cartilage and   bone-on-bone arthritis with associated osteophytes in all three compartments of   the knee worse medially with a significant synovitis and associated effusion.      PROCEDURE:  Right total knee replacement.      COMPONENTS USED:  De[employer] Sigma rotating platform posterior stabilized knee   system, a size 2.5 femur, 2.5 tibia, 10 mm PS insert, and 38 patellar   button.      SURGEON:  Pietro Cassis. Alvan Dame, M.D.      ASSISTANT:  Danae Orleans, PA-C.      ANESTHESIA:  Spinal.      SPECIMENS:  None.      COMPLICATION:  None.      DRAINS:  None.  EBL: <50cc      TOURNIQUET TIME:   Total Tourniquet Time Documented: Thigh (Right) - 36 minutes Total: Thigh (Right) - 36 minutes  .      The patient was stable to the recovery room.      INDICATION FOR PROCEDURE:  [name] is a 81 y.o. female patient of   mine.  The patient had been seen, evaluated, and treated conservatively in the   office with medication, activity modification, and injections.  The patient had   radiographic changes of bone-on-bone arthritis with endplate sclerosis and osteophytes noted.      The patient failed conservative measures including medication, injections, and activity modification, and at this point was ready for more definitive measures.   Based on the radiographic changes and failed conservative measures, the patient   decided to proceed with total knee replacement.  Risks of infection,   DVT, component failure, need for  revision surgery, postop course, and   expectations were all   discussed and reviewed.  Consent was obtained for benefit of pain   relief.      PROCEDURE IN DETAIL:  The patient was brought to the operative theater.   Once adequate anesthesia, preoperative antibiotics, 2 gm of Ancef and 10 mg of Decadron administered, the patient was positioned supine with the right thigh tourniquet placed.  The  right lower extremity was prepped and draped in sterile fashion.  A time-   out was performed identifying the patient, planned procedure, and   extremity.      The right lower extremity was placed in the [facility] leg holder.  The leg was   exsanguinated, tourniquet elevated to 250 mmHg.  A midline incision was   made followed by median parapatellar arthrotomy.  Following initial  exposure, attention was first directed to the patella.  Precut   measurement was noted to be 25 mm.  I resected down to 14-15 mm and used a   38 patellar button to restore patellar height as well as cover the cut   surface.      The lug holes were drilled and a metal shim was placed to protect the   patella from retractors and saw blades.      At this point, attention was now directed to the femur.  The femoral   canal was opened with a drill, irrigated to try to prevent fat emboli.  An   intramedullary rod was passed at 3 degrees valgus, 9 mm of bone was   resected off the distal femur.  Following this resection, the tibia was   subluxated anteriorly.  Using the extramedullary guide, 8 mm of bone was resected off   the proximal lateral tibia.  We confirmed the gap would be   stable medially and laterally with a 10 mm insert as well as confirmed   the cut was perpendicular in the coronal plane, checking with an alignment rod.      Once this was done, I sized the femur to be a size 2.5 in the anterior-   posterior dimension, chose a standard component based on medial and   lateral dimension.  The size 2.5 rotation  block was then pinned in   position anterior referenced using the C-clamp to set rotation.  The   anterior, posterior, and  chamfer cuts were made without difficulty nor   notching making certain that I was along the anterior cortex to help   with flexion gap stability.      The final box cut was made off the lateral aspect of distal femur.      At this point, the tibia was sized to be a size 2.5, the size 2.5 tray was   then pinned in position through the medial third of the tubercle,   drilled, and keel punched.  Significant medial osteophytes were re-sected off the proximal medial tibia.  Trial reduction was now carried with a 2.5 femur,  2.5 tibia, a size 10 mm PS insert, and the 38 patella botton.  The knee was brought to   extension, full extension with good flexion stability with the patella   tracking through the trochlea without application of pressure.  Given   all these findings, the trial components removed.  Final components were   opened and cement was mixed.  The knee was irrigated with normal saline   solution and pulse lavage.  The synovial lining was   then injected with 30cc of 0.25% Marcaine with epinephrine and 1 cc of Toradol plus 30cc of NS for a   total of 61 cc.      The knee was irrigated.  Final implants were then cemented onto clean and   dried cut surfaces of bone with the knee brought to extension with a 10 mm trial insert.      Once the cement had fully cured, the excess cement was removed   throughout the knee.  I confirmed I was satisfied with the range of   motion and stability, and the final 10 mm PS insert was chosen.  It was   placed into the knee.      The tourniquet had been let down at 36 minutes.  No significant   hemostasis required.  The   extensor mechanism  was then reapproximated using #1 Vicryl and #0 V-lock sutures with the knee   in flexion.  The   remaining wound was closed with 2-0 Vicryl and running 4-0 Monocryl.   The knee was  cleaned, dried, dressed sterilely using Dermabond and   Aquacel dressing.  The patient was then   brought to recovery room in stable condition, tolerating the procedure   well.   Please note that Physician Assistant, Danae Orleans, PA-C, was present for the entirety of the case, and was utilized for pre-operative positioning, peri-operative retractor management, general facilitation of the procedure.  He was also utilized for primary wound closure at the end of the case.              Pietro Cassis Alvan Dame, M.D.    08/13/2015 2:05 PM

## 2015-08-13 NOTE — Progress Notes (Signed)
Patient finished taking Cipro for UTI

## 2015-08-13 NOTE — Interval H&P Note (Signed)
History and Physical Interval Note:  08/13/2015 11:11 AM  [name]  has presented today for surgery, with the diagnosis of RIGHT T KNEE OA  The various methods of treatment have been discussed with the patient and family. After consideration of risks, benefits and other options for treatment, the patient has consented to  Procedure(s): RIGHT  TOTAL KNEE ARTHROPLASTY (Right) as a surgical intervention .  The patient's history has been reviewed, patient examined, no change in status, stable for surgery.  I have reviewed the patient's chart and labs.  Questions were answered to the patient's satisfaction.     Mauri Pole

## 2015-08-13 NOTE — Anesthesia Postprocedure Evaluation (Signed)
  Anesthesia Post-op Note  Patient: [name]  Procedure(s) Performed: Procedure(s) (LRB): RIGHT  TOTAL KNEE ARTHROPLASTY (Right)  Patient Location: PACU  Anesthesia Type: Spinal  Level of Consciousness: awake and alert   Airway and Oxygen Therapy: Patient Spontanous Breathing  Post-op Pain: mild  Post-op Assessment: Post-op Vital signs reviewed, Patient's Cardiovascular Status Stable, Respiratory Function Stable, Patent Airway and No signs of Nausea or vomiting  Last Vitals:  Filed Vitals:   08/13/15 1704  BP: 160/68  Pulse: 66  Temp: 36.8 C  Resp: 15    Post-op Vital Signs: stable   Complications: No apparent anesthesia complications

## 2015-08-13 NOTE — Anesthesia Preprocedure Evaluation (Signed)
Anesthesia Evaluation  Patient identified by MRN, date of birth, ID band Patient awake    Reviewed: Allergy & Precautions, NPO status , Patient's Chart, lab work & pertinent test results  Airway Mallampati: II  TM Distance: >3 FB Neck ROM: Full    Dental no notable dental hx. (+) Edentulous Upper, Edentulous Lower   Pulmonary asthma ,  breath sounds clear to auscultation  Pulmonary exam normal       Cardiovascular hypertension, + CAD and + CABG (2006) Normal cardiovascular examRhythm:Regular Rate:Normal     Neuro/Psych dementia CVA, Residual Symptoms negative neurological ROS  negative psych ROS   GI/Hepatic negative GI ROS, Neg liver ROS,   Endo/Other  diabetes, Type 2, Insulin Dependent  Renal/GU negative Renal ROS  negative genitourinary   Musculoskeletal negative musculoskeletal ROS (+)   Abdominal   Peds negative pediatric ROS (+)  Hematology negative hematology ROS (+)   Anesthesia Other Findings   Reproductive/Obstetrics negative OB ROS                             Anesthesia Physical Anesthesia Plan  ASA: III  Anesthesia Plan: Spinal   Post-op Pain Management:    Induction:   Airway Management Planned: Simple Face Mask  Additional Equipment:   Intra-op Plan:   Post-operative Plan:   Informed Consent: I have reviewed the patients History and Physical, chart, labs and discussed the procedure including the risks, benefits and alternatives for the proposed anesthesia with the patient or authorized representative who has indicated his/her understanding and acceptance.   Dental advisory given  Plan Discussed with: CRNA  Anesthesia Plan Comments: (Off plavix x 7 days. SAB)        Anesthesia Quick Evaluation

## 2015-08-14 LAB — BASIC METABOLIC PANEL
Anion gap: 7 (ref 5–15)
BUN: 33 mg/dL — AB (ref 6–20)
CO2: 25 mmol/L (ref 22–32)
Calcium: 9.3 mg/dL (ref 8.9–10.3)
Chloride: 105 mmol/L (ref 101–111)
Creatinine, Ser: 1.21 mg/dL — ABNORMAL HIGH (ref 0.44–1.00)
GFR calc Af Amer: 47 mL/min — ABNORMAL LOW (ref >60)
GFR calc non Af Amer: 41 mL/min — ABNORMAL LOW (ref >60)
GLUCOSE: 320 mg/dL — AB (ref 65–99)
POTASSIUM: 5.3 mmol/L — AB (ref 3.5–5.1)
Sodium: 137 mmol/L (ref 135–145)

## 2015-08-14 LAB — GLUCOSE, CAPILLARY
GLUCOSE-CAPILLARY: 94 mg/dL (ref 65–99)
Glucose-Capillary: 268 mg/dL — ABNORMAL HIGH (ref 65–99)
Glucose-Capillary: 236 mg/dL — ABNORMAL HIGH (ref 65–99)
Glucose-Capillary: 136 mg/dL — ABNORMAL HIGH (ref 65–99)

## 2015-08-14 LAB — CBC
HCT: 31.4 % — ABNORMAL LOW (ref 36.0–46.0)
Hemoglobin: 10.3 g/dL — ABNORMAL LOW (ref 12.0–15.0)
MCH: 29.2 pg (ref 26.0–34.0)
MCHC: 32.8 g/dL (ref 30.0–36.0)
MCV: 89 fL (ref 78.0–100.0)
PLATELETS: 162 10*3/uL (ref 150–400)
RBC: 3.53 MIL/uL — AB (ref 3.87–5.11)
RDW: 14.4 % (ref 11.5–15.5)
WBC: 13.2 10*3/uL — ABNORMAL HIGH (ref 4.0–10.5)

## 2015-08-14 NOTE — Progress Notes (Signed)
Physical Therapy Treatment Patient Details Name: [name] MRN: [id number] DOB: 1931-03-24 Today's Date: 08/14/2015    History of Present Illness 81 yo female s/p R TKA 08/13/15    PT Comments    ROM exercises-pt tolerated well  Follow Up Recommendations  SNF     Equipment Recommendations  None recommended by PT    Recommendations for Other Services       Precautions / Restrictions Precautions Precautions: Fall;Knee Restrictions Weight Bearing Restrictions: No RLE Weight Bearing: Weight bearing as tolerated    Mobility  Bed Mobility  Transfers        Ambulation/Gait       Stairs            Wheelchair Mobility    Modified Rankin (Stroke Patients Only)       Balance           Standing balance support: During functional activity;Bilateral upper extremity supported Standing balance-Leahy Scale: Poor                      Cognition Arousal/Alertness: Awake/alert Behavior During Therapy: WFL for tasks assessed/performed Overall Cognitive Status: Within Functional Limits for tasks assessed                      Exercises Total Joint Exercises Ankle Circles/Pumps: AROM;Both;10 reps;Supine Quad Sets: AROM;Both;5 reps;Supine Heel Slides: AAROM;Right;Supine Hip ABduction/ADduction: AAROM;Right;10 reps;Supine Straight Leg Raises: AAROM;Right;10 reps;Supine Goniometric ROM: ~10-45 degrees    General Comments        Pertinent Vitals/Pain Pain Assessment: Faces Faces Pain Scale: Hurts little more Pain Location: R knee Pain Descriptors / Indicators: Sore;Discomfort Pain Intervention(s): Ice applied    Home Living                      Prior Function            PT Goals (current goals can now be found in the care plan section) Progress towards PT goals: Progressing toward goals    Frequency  7X/week    PT Plan Current plan remains appropriate    Co-evaluation             End of Session  Equipment Utilized During Treatment: Gait belt Activity Tolerance: Patient limited by fatigue (drowsy) Patient left: in bed;with bed alarm set;with family/visitor present     Time: [id number] PT Time Calculation (min) (ACUTE ONLY): 8 min  Charges:  $Gait Training: 8-22 mins $Therapeutic Exercise: 8-22 mins $Therapeutic Activity: 8-22 mins                    G Codes:      Weston Anna, MPT Pager: [phone]

## 2015-08-14 NOTE — Care Management Note (Signed)
Case Management Note  Patient Details  Name: [name] MRN: [id number] Date of Birth: [birth year]  Subjective/Objective:      Right total knee replacement.              Action/Plan: Discharge planning per CSW  Expected Discharge Date:  08/13/15               Expected Discharge Plan:  [place]  In-House Referral:  Clinical Social Work  Discharge planning Services  CM Consult  Post Acute Care Choice:  NA Choice offered to:  NA  DME Arranged:  N/A DME Agency:  NA  HH Arranged:  NA HH Agency:  NA  Status of Service:  Completed, signed off  Medicare Important Message Given:    Date Medicare IM Given:    Medicare IM give by:    Date Additional Medicare IM Given:    Additional Medicare Important Message give by:     If discussed at [place] of Stay Meetings, dates discussed:    Additional Comments:  Guadalupe Maple, RN 08/14/2015, 11:07 AM

## 2015-08-14 NOTE — Clinical Social Work Note (Signed)
Clinical Social Work Assessment  Patient Details  Name: [name] MRN: [id number] Date of Birth: [birth year]  Date of referral:  08/14/15               Reason for consult:  Facility Placement, Discharge Planning                Permission sought to share information with:  Chartered certified accountant granted to share information::  Yes, Verbal Permission Granted  Name::        Agency::     Relationship::     Contact Information:     Housing/Transportation Living arrangements for the past 2 months:  Single Family Home Source of Information:  Patient, Adult Children Patient Interpreter Needed:  None Criminal Activity/Legal Involvement Pertinent to Current Situation/Hospitalization:  No - Comment as needed Significant Relationships:  Adult Children Lives with:  Self Do you feel safe going back to the place where you live?   (ST Rehab needed.) Need for family participation in patient care:  Yes (Comment)  Care giving concerns:  Pt's care cannot be managed at home following hospital d/c.   Social Worker assessment / plan:  Pt hospitalized on 08/13/15 for pre planned right total knee arthroplasty. CSW met with pt / daughter to assist with d/c planning. PT has recommended ST Rehab at d/c. Pt / daughter agree with this plan and have requested [facility] for rehab. SNF has been contacted and clinicals sent for review. [facility] is able to offer placement at d/c. CSW will continue to follow to assist with d/c planning to SNF.  Employment status:  Retired Nurse, adult PT Recommendations:  [place] / Referral to community resources:  [place]  Patient/Family's Response to care:  Pt / daughter agree that [employer] is needed.  Patient/Family's Understanding of and Emotional Response to Diagnosis, Current Treatment, and Prognosis:  Pt / daughter are aware of pt's medical status. They are both pleased that [facility] will have  a rehab opening at d/c.  Emotional Assessment Appearance:  Appears stated age Attitude/Demeanor/Rapport:  Other (Cooperative) Affect (typically observed):  Calm, Pleasant, Appropriate Orientation:  Oriented to Self, Oriented to Place, Oriented to  Time, Oriented to Situation Alcohol / Substance use:  Not Applicable Psych involvement (Current and /or in the community):  No (Comment)  Discharge Needs  Concerns to be addressed:  Discharge Planning Concerns Readmission within the last 30 days:  No Current discharge risk:  None Barriers to Discharge:  No Barriers Identified   Luretha Rued, [place] 08/14/2015, 2:06 PM

## 2015-08-14 NOTE — Evaluation (Signed)
Physical Therapy Evaluation Patient Details Name: [name] MRN: [id number] DOB: 05/15/1931 Today's Date: 08/14/2015   History of Present Illness  81 yo female s/p R TKA 08/13/15  Clinical Impression  On eval, pt required Min-Mod assist for mobility-walked ~15 feet with RW. Pt fatigues fairly quickly. Reported some lightheadedness with OOB activity. Recommend ST rehab at SNF to maximize independence and safety with functional mobility.     Follow Up Recommendations SNF    Equipment Recommendations  None recommended by PT    Recommendations for Other Services       Precautions / Restrictions Precautions Precautions: Fall Restrictions Weight Bearing Restrictions: No RLE Weight Bearing: Weight bearing as tolerated      Mobility  Bed Mobility Overal bed mobility: Needs Assistance Bed Mobility: Supine to Sit     Supine to sit: Min assist     General bed mobility comments: Assist for R LE. Increased time. Sat at EOB for a few extra minutes due to lightheadedness  Transfers Overall transfer level: Needs assistance Equipment used: Rolling walker (2 wheeled) Transfers: Sit to/from Stand Sit to Stand: From elevated surface;Mod assist         General transfer comment: Lilght Mod assist to rise. Increased time. Multimodal cues for safety, technique.   Ambulation/Gait Ambulation/Gait assistance: Min assist Ambulation Distance (Feet): 15 Feet Assistive device: Rolling walker (2 wheeled) Gait Pattern/deviations: Step-to pattern;Trunk flexed;Antalgic     General Gait Details: Assist to stabilize and maneuver with RW. Mod VCs for safety, posture, sequence, step length, distance from walker. Fatigues quickly. Followed with recliner  Stairs            Wheelchair Mobility    Modified Rankin (Stroke Patients Only)       Balance Overall balance assessment: Needs assistance         Standing balance support: Bilateral upper extremity supported;During  functional activity Standing balance-Leahy Scale: Poor                               Pertinent Vitals/Pain Pain Assessment: Faces Faces Pain Scale: Hurts little more Pain Location: R knee with activity Pain Descriptors / Indicators: Aching;Sore Pain Intervention(s): Limited activity within patient's tolerance;Repositioned;Monitored during session;Ice applied    Home Living Family/patient expects to be discharged to:: Skilled nursing facility Living Arrangements: Children                    Prior Function Level of Independence: Needs assistance   Gait / Transfers Assistance Needed: uses walker           Hand Dominance        Extremity/Trunk Assessment   Upper Extremity Assessment: Defer to OT evaluation           Lower Extremity Assessment: RLE deficits/detail RLE Deficits / Details: moves ankle well.     Cervical / Trunk Assessment: Normal  Communication   Communication: No difficulties  Cognition Arousal/Alertness: Awake/alert Behavior During Therapy: WFL for tasks assessed/performed Overall Cognitive Status: Within Functional Limits for tasks assessed                      General Comments      Exercises        Assessment/Plan    PT Assessment Patient needs continued PT services  PT Diagnosis Difficulty walking;Acute pain;Generalized weakness   PT Problem List Decreased strength;Decreased range of motion;Decreased activity tolerance;Decreased balance;Decreased mobility;Decreased knowledge of  use of DME;Pain  PT Treatment Interventions DME instruction;Gait training;Functional mobility training;Therapeutic activities;Patient/family education;Balance training;Therapeutic exercise   PT Goals (Current goals can be found in the Care Plan section) Acute Rehab PT Goals Patient Stated Goal: Get better, stronger.  PT Goal Formulation: With patient/family Time For Goal Achievement: 08/21/15 Potential to Achieve Goals: Good     Frequency 7X/week   Barriers to discharge        Co-evaluation               End of Session Equipment Utilized During Treatment: Gait belt Activity Tolerance: Patient limited by fatigue Patient left: in chair;with call bell/phone within reach;with family/visitor present           Time: 1021-1034 PT Time Calculation (min) (ACUTE ONLY): 13 min   Charges:   PT Evaluation $Initial PT Evaluation Tier I: 1 Procedure     PT G Codes:        Weston Anna, MPT Pager: [phone]

## 2015-08-14 NOTE — Progress Notes (Addendum)
     Subjective: 1 Day Post-Op Procedure(s) (LRB): RIGHT  TOTAL KNEE ARTHROPLASTY (Right)   Patient reports pain as mild, pain well controlled. No events throughout the night otherwise.   Objective:   VITALS:   Filed Vitals:   08/14/15 0452  BP: 160/63  Pulse: 67  Temp: 98.4 F (36.9 C)  Resp: 16    Dorsiflexion/Plantar flexion intact Incision: dressing C/D/I No cellulitis present Compartment soft  LABS  Recent Labs  08/14/15 0450  HGB 10.3*  HCT 31.4*  WBC 13.2*  PLT 162     Recent Labs  08/14/15 0450  NA 137  K 5.3*  BUN 33*  CREATININE 1.21*  GLUCOSE 320*     Assessment/Plan: 1 Day Post-Op Procedure(s) (LRB): RIGHT  TOTAL KNEE ARTHROPLASTY (Right) Advance diet Up with therapy D/C IV fluids Discharge to SNF eventually, when ready   [place]. Yan Pankratz   PAC  08/14/2015, 8:29 AM

## 2015-08-14 NOTE — Progress Notes (Signed)
Physical Therapy Treatment Patient Details Name: CEYDA MACMULLEN MRN: [id number] DOB: 1931/02/17 Today's Date: 08/14/2015    History of Present Illness 81 yo female s/p R TKA 08/13/15    PT Comments    Progressing slowly with mobility. At end of session pt became clammy and sweaty. Assisted back to bed. Assessed vitals: BP 129/52, HR 59 bpm, O2 94% on RA. Made RN aware.   Follow Up Recommendations  SNF     Equipment Recommendations  None recommended by PT    Recommendations for Other Services       Precautions / Restrictions Precautions Precautions: Fall;Knee Restrictions Weight Bearing Restrictions: No RLE Weight Bearing: Weight bearing as tolerated    Mobility  Bed Mobility Overal bed mobility: Needs Assistance Bed Mobility: Sit to Supine      Sit to supine: Min assist   General bed mobility comments: Assist for LEs onto bed. Pt abruptly fell back onto bed after sitting down. Assist needed to reposition trunk and LEs.   Transfers Overall transfer level: Needs assistance Equipment used: Rolling walker (2 wheeled) Transfers: Sit to/from Stand Sit to Stand: Min assist/Mod assist         General transfer comment: assist to rise, stabilize, control descent. VCs safety, technique, hand placement.   Ambulation/Gait Ambulation/Gait assistance: Min assist Ambulation Distance (Feet): 15 Feet (x2) Assistive device: Rolling walker (2 wheeled) Gait Pattern/deviations: Step-to pattern;Trunk flexed     General Gait Details: Assist to stabilize and maneuver with RW. Mod VCs for safety, posture, sequence, step length, distance from walker. Fatigues quickly. walked into bathroom then sat to rest for a bit before walking back to bed.    Stairs            Wheelchair Mobility    Modified Rankin (Stroke Patients Only)       Balance Overall balance assessment: Needs assistance Sitting-balance support: No upper extremity supported;Feet supported Sitting  balance-Leahy Scale: Fair     Standing balance support: During functional activity;Bilateral upper extremity supported Standing balance-Leahy Scale: Poor                      Cognition Arousal/Alertness: Awake/alert Behavior During Therapy: WFL for tasks assessed/performed Overall Cognitive Status: Within Functional Limits for tasks assessed                      Exercises      General Comments        Pertinent Vitals/Pain Pain Assessment: Faces Faces Pain Scale: Hurts little more Pain Location: R knee with activity Pain Descriptors / Indicators: Discomfort;Sore Pain Intervention(s): Monitored during session;Repositioned    Home Living Family/patient expects to be discharged to:: [place]: Kasandra Knudsen - single point;Toilet riser      Prior Function Level of Independence: Needs assistance  Gait / Transfers Assistance Needed: uses walker ADL's / Homemaking Assistance Needed: sponge bathes but sets up herself, dressing independent     PT Goals (current goals can now be found in the care plan section) Acute Rehab PT Goals Patient Stated Goal: Get better, stronger.  PT Goal Formulation: With patient/family Time For Goal Achievement: 08/21/15 Potential to Achieve Goals: Good Progress towards PT goals: Progressing toward goals    Frequency  7X/week    PT Plan Current plan remains appropriate    Co-evaluation  End of Session Equipment Utilized During Treatment: Gait belt Activity Tolerance: Patient limited by fatigue (clammy and sweaty once back in bed. assessed bp and made Rn aware of session) Patient left: with call bell/phone within reach;with family/visitor present;with bed alarm set     Time: [id number] PT Time Calculation (min) (ACUTE ONLY): 25 min  Charges:  $Gait Training: 8-22 mins $Therapeutic Activity: 8-22 mins                    G Codes:       Weston Anna, MPT Pager: [phone]

## 2015-08-14 NOTE — Clinical Social Work Placement (Signed)
   CLINICAL SOCIAL WORK PLACEMENT  NOTE  Date:  08/14/2015  Patient Details  Name: HONORA GWILT MRN: [id number] Date of Birth: 03/05/1931  Clinical Social Work is seeking post-discharge placement for this patient at the [place] level of care (*CSW will initial, date and re-position this form in  chart as items are completed):  No   Patient/family provided with [facility] Work Department's list of facilities offering this level of care within the geographic area requested by the patient (or if unable, by the patient's family).  Yes   Patient/family informed of their freedom to choose among providers that offer the needed level of care, that participate in Medicare, Medicaid or managed care program needed by the patient, have an available bed and are willing to accept the patient.  Yes   Patient/family informed of [facility]'s ownership interest in [facility] and [facility], as well as of the fact that they are under no obligation to receive care at these facilities.  PASRR submitted to EDS on 08/13/15     PASRR number received on 08/13/15     Existing PASRR number confirmed on       FL2 transmitted to all facilities in geographic area requested by pt/family on 08/14/15     FL2 transmitted to all facilities within larger geographic area on       Patient informed that his/her managed care company has contracts with or will negotiate with certain facilities, including the following:        Yes   Patient/family informed of bed offers received.  Patient chooses bed at [facility]     Physician recommends and patient chooses bed at      Patient to be transferred to [facility] on  .  Patient to be transferred to facility by       Patient family notified on   of transfer.  Name of family member notified:        PHYSICIAN       Additional Comment:    _______________________________________________ Luretha Rued, [place] 08/14/2015, 2:14 PM

## 2015-08-14 NOTE — Evaluation (Signed)
Occupational Therapy Evaluation Patient Details Name: [name] MRN: [id number] DOB: August 28, 1931 Today's Date: 08/14/2015    History of Present Illness 81 yo female s/p R TKA 08/13/15   Clinical Impression   Patient presenting with decreased ADL, IADL, functional mobility independence secondary to above. Patient independent and living with daughter PTA. Patient currently requires up to mod assist for sit<>stands and functional mobility & up to min assist for ADLs. Patient will benefit from acute OT to increase overall independence in the areas of ADLs, functional mobility, and overall safety in order to safely discharge to venue listed below.     Follow Up Recommendations  SNF;Supervision/Assistance - 24 hour    Equipment Recommendations  Tub/shower bench;Other (comment) (TBD)    Recommendations for Other Services  None at this time    Precautions / Restrictions Precautions Precautions: Fall;Knee Restrictions Weight Bearing Restrictions: Yes RLE Weight Bearing: Weight bearing as tolerated    Mobility Bed Mobility - Per PT evaluation  Overal bed mobility: Needs Assistance Bed Mobility: Supine to Sit     Supine to sit: Min assist     General bed mobility comments: Assist for R LE. Increased time. Sat at EOB for a few extra minutes due to lightheadedness  Transfers Overall transfer level: Needs assistance Equipment used: Rolling walker (2 wheeled) Transfers: Sit to/from Stand Sit to Stand: Mod assist (from recliner)         General transfer comment: Lilght Mod assist to rise. Increased time. Multimodal cues for safety, technique. Took 2 attempts to come to upright standing.     Balance Overall balance assessment: Needs assistance Sitting-balance support: No upper extremity supported;Feet supported Sitting balance-Leahy Scale: Fair     Standing balance support: Bilateral upper extremity supported;During functional activity Standing balance-Leahy Scale: Poor     ADL Overall ADL's : Needs assistance/impaired General ADL Comments: With extra time, pt able to reach BLEs for LB ADLs. Pt able to perform sit<>stand with mod assist using RW, took 2 attempts to stand upright. Pt stated she normally performs sponge baths secondary to not being able to step over tub. Therapist educated pt and daughter on use of TTB to increase independence and safety with tub/showers. Discussed that insurance will not cover and that they will have to purchase this on their own.      Pertinent Vitals/Pain Pain Assessment: Faces Faces Pain Scale: Hurts a little bit Pain Location: R knee with activity Pain Descriptors / Indicators: Discomfort;Tightness Pain Intervention(s): Monitored during session;Ice applied     Hand Dominance Right   Extremity/Trunk Assessment Upper Extremity Assessment Upper Extremity Assessment: Overall WFL for tasks assessed   Lower Extremity Assessment Lower Extremity Assessment: Defer to PT evaluation RLE Deficits / Details: moves ankle well.    Cervical / Trunk Assessment Cervical / Trunk Assessment: Normal   Communication Communication Communication: No difficulties   Cognition Arousal/Alertness: Awake/alert Behavior During Therapy: WFL for tasks assessed/performed Overall Cognitive Status: Within Functional Limits for tasks assessed              Home Living Family/patient expects to be discharged to:: Skilled nursing facility Living Arrangements: Children Bathroom Shower/Tub: Tub/shower unit;Door   [employer] Toilet: Standard     Home Equipment: Kasandra Knudsen - single point;Toilet riser   Prior Functioning/Environment Level of Independence: Needs assistance  Gait / Transfers Assistance Needed: uses walker ADL's / Homemaking Assistance Needed: sponge bathes but sets up herself, dressing independent    OT Diagnosis: Generalized weakness;Acute pain   OT Problem List:  Decreased strength;Decreased range of motion;Decreased activity  tolerance;Impaired balance (sitting and/or standing);Decreased safety awareness;Decreased knowledge of use of DME or AE;Pain;Decreased knowledge of precautions   OT Treatment/Interventions: Self-care/ADL training;Therapeutic exercise;Energy conservation;DME and/or AE instruction;Therapeutic activities;Patient/family education;Balance training    OT Goals(Current goals can be found in the care plan section) Acute Rehab OT Goals Patient Stated Goal: Get better, stronger.  OT Goal Formulation: With patient/family Time For Goal Achievement: 08/28/15 Potential to Achieve Goals: Good ADL Goals Pt Will Perform Grooming: with supervision;standing Pt Will Perform Lower Body Bathing: with modified independence;sit to/from stand Pt Will Perform Lower Body Dressing: with modified independence;sit to/from stand Pt Will Transfer to Toilet: with supervision;ambulating;bedside commode Additional ADL Goal #1: Pt will perform sit<>stands and functional mobility using RW with supervision  OT Frequency: Min 2X/week   Barriers to D/C: None known at this time   End of Session Equipment Utilized During Treatment: Rolling walker  Activity Tolerance: Patient tolerated treatment well Patient left: in chair;with call bell/phone within reach;with family/visitor present   Time: [id number] OT Time Calculation (min): 15 min Charges:  OT General Charges $OT Visit: 1 Procedure OT Evaluation $Initial OT Evaluation Tier I: 1 Procedure  [name] , MS, OTR/L, CLT Pager: [phone]  08/14/2015, 11:10 AM

## 2015-08-15 ENCOUNTER — Encounter (HOSPITAL_COMMUNITY): Payer: Self-pay | Admitting: Orthopedic Surgery

## 2015-08-15 ENCOUNTER — Inpatient Hospital Stay
Admission: RE | Admit: 2015-08-15 | Discharge: 2015-09-25 | Disposition: A | Discharge status: Home / Self Care | Payer: Medicare Other | Source: Ambulatory Visit | Attending: Internal Medicine | Admitting: Internal Medicine

## 2015-08-15 DIAGNOSIS — R410 Disorientation, unspecified: Secondary | ICD-10-CM | POA: Diagnosis not present

## 2015-08-15 DIAGNOSIS — R278 Other lack of coordination: Secondary | ICD-10-CM | POA: Diagnosis not present

## 2015-08-15 DIAGNOSIS — I82401 Acute embolism and thrombosis of unspecified deep veins of right lower extremity: Secondary | ICD-10-CM

## 2015-08-15 DIAGNOSIS — I10 Essential (primary) hypertension: Secondary | ICD-10-CM | POA: Diagnosis not present

## 2015-08-15 DIAGNOSIS — E669 Obesity, unspecified: Secondary | ICD-10-CM | POA: Diagnosis not present

## 2015-08-15 DIAGNOSIS — I2510 Atherosclerotic heart disease of native coronary artery without angina pectoris: Secondary | ICD-10-CM | POA: Diagnosis not present

## 2015-08-15 DIAGNOSIS — E785 Hyperlipidemia, unspecified: Secondary | ICD-10-CM | POA: Diagnosis not present

## 2015-08-15 DIAGNOSIS — I6990 Unspecified sequelae of unspecified cerebrovascular disease: Secondary | ICD-10-CM | POA: Diagnosis not present

## 2015-08-15 DIAGNOSIS — N189 Chronic kidney disease, unspecified: Secondary | ICD-10-CM | POA: Diagnosis not present

## 2015-08-15 DIAGNOSIS — G8929 Other chronic pain: Secondary | ICD-10-CM | POA: Diagnosis not present

## 2015-08-15 DIAGNOSIS — R600 Localized edema: Secondary | ICD-10-CM | POA: Diagnosis not present

## 2015-08-15 DIAGNOSIS — I5031 Acute diastolic (congestive) heart failure: Secondary | ICD-10-CM | POA: Diagnosis not present

## 2015-08-15 DIAGNOSIS — D649 Anemia, unspecified: Secondary | ICD-10-CM | POA: Diagnosis not present

## 2015-08-15 DIAGNOSIS — M79604 Pain in right leg: Secondary | ICD-10-CM | POA: Diagnosis not present

## 2015-08-15 DIAGNOSIS — K219 Gastro-esophageal reflux disease without esophagitis: Secondary | ICD-10-CM | POA: Diagnosis not present

## 2015-08-15 DIAGNOSIS — N179 Acute kidney failure, unspecified: Secondary | ICD-10-CM | POA: Diagnosis not present

## 2015-08-15 DIAGNOSIS — F0390 Unspecified dementia without behavioral disturbance: Secondary | ICD-10-CM | POA: Diagnosis not present

## 2015-08-15 DIAGNOSIS — M25561 Pain in right knee: Secondary | ICD-10-CM

## 2015-08-15 DIAGNOSIS — M6281 Muscle weakness (generalized): Secondary | ICD-10-CM | POA: Diagnosis not present

## 2015-08-15 DIAGNOSIS — N289 Disorder of kidney and ureter, unspecified: Secondary | ICD-10-CM | POA: Diagnosis not present

## 2015-08-15 DIAGNOSIS — R262 Difficulty in walking, not elsewhere classified: Secondary | ICD-10-CM | POA: Diagnosis not present

## 2015-08-15 DIAGNOSIS — Z471 Aftercare following joint replacement surgery: Secondary | ICD-10-CM | POA: Diagnosis not present

## 2015-08-15 DIAGNOSIS — F329 Major depressive disorder, single episode, unspecified: Secondary | ICD-10-CM | POA: Diagnosis not present

## 2015-08-15 DIAGNOSIS — F411 Generalized anxiety disorder: Secondary | ICD-10-CM | POA: Diagnosis not present

## 2015-08-15 DIAGNOSIS — I5032 Chronic diastolic (congestive) heart failure: Secondary | ICD-10-CM | POA: Diagnosis not present

## 2015-08-15 DIAGNOSIS — E119 Type 2 diabetes mellitus without complications: Secondary | ICD-10-CM | POA: Diagnosis not present

## 2015-08-15 DIAGNOSIS — R4182 Altered mental status, unspecified: Secondary | ICD-10-CM | POA: Diagnosis not present

## 2015-08-15 DIAGNOSIS — D638 Anemia in other chronic diseases classified elsewhere: Secondary | ICD-10-CM | POA: Diagnosis not present

## 2015-08-15 DIAGNOSIS — M25461 Effusion, right knee: Secondary | ICD-10-CM | POA: Diagnosis not present

## 2015-08-15 DIAGNOSIS — Z9981 Dependence on supplemental oxygen: Secondary | ICD-10-CM | POA: Diagnosis not present

## 2015-08-15 DIAGNOSIS — R1010 Upper abdominal pain, unspecified: Secondary | ICD-10-CM | POA: Diagnosis not present

## 2015-08-15 DIAGNOSIS — Z96651 Presence of right artificial knee joint: Secondary | ICD-10-CM | POA: Diagnosis not present

## 2015-08-15 LAB — CBC
HCT: 28.5 % — ABNORMAL LOW (ref 36.0–46.0)
HEMOGLOBIN: 9.3 g/dL — AB (ref 12.0–15.0)
MCH: 28.9 pg (ref 26.0–34.0)
MCHC: 32.6 g/dL (ref 30.0–36.0)
MCV: 88.5 fL (ref 78.0–100.0)
Platelets: 152 10*3/uL (ref 150–400)
RBC: 3.22 MIL/uL — AB (ref 3.87–5.11)
RDW: 14.5 % (ref 11.5–15.5)
WBC: 9.2 10*3/uL (ref 4.0–10.5)

## 2015-08-15 LAB — BASIC METABOLIC PANEL
ANION GAP: 7 (ref 5–15)
BUN: 33 mg/dL — ABNORMAL HIGH (ref 6–20)
CHLORIDE: 104 mmol/L (ref 101–111)
CO2: 26 mmol/L (ref 22–32)
CREATININE: 0.98 mg/dL (ref 0.44–1.00)
Calcium: 9.4 mg/dL (ref 8.9–10.3)
GFR calc non Af Amer: 53 mL/min — ABNORMAL LOW (ref >60)
Glucose, Bld: 103 mg/dL — ABNORMAL HIGH (ref 65–99)
Potassium: 4.5 mmol/L (ref 3.5–5.1)
SODIUM: 137 mmol/L (ref 135–145)

## 2015-08-15 LAB — GLUCOSE, CAPILLARY
GLUCOSE-CAPILLARY: 104 mg/dL — AB (ref 65–99)
Glucose-Capillary: 186 mg/dL — ABNORMAL HIGH (ref 65–99)

## 2015-08-15 MED ORDER — TIZANIDINE HCL 4 MG PO TABS: 4.0000 mg | ORAL_TABLET | Freq: Four times a day (QID) | ORAL | Status: DC | PRN

## 2015-08-15 MED ORDER — FERROUS SULFATE 325 (65 FE) MG PO TABS: 325.0000 mg | ORAL_TABLET | Freq: Three times a day (TID) | ORAL | Status: DC

## 2015-08-15 MED ORDER — POLYETHYLENE GLYCOL 3350 17 G PO PACK: 17.0000 g | PACK | Freq: Two times a day (BID) | ORAL | Status: DC

## 2015-08-15 MED ORDER — LORAZEPAM 0.5 MG PO TABS: ORAL_TABLET | ORAL | Status: DC

## 2015-08-15 MED ORDER — HYDROCODONE-ACETAMINOPHEN 7.5-325 MG PO TABS: 1.0000 | ORAL_TABLET | ORAL | Status: DC | PRN

## 2015-08-15 MED ORDER — ASPIRIN 325 MG PO TBEC: 325.0000 mg | DELAYED_RELEASE_TABLET | Freq: Every day | ORAL | Status: AC

## 2015-08-15 MED ORDER — DOCUSATE SODIUM 100 MG PO CAPS: 100.0000 mg | ORAL_CAPSULE | Freq: Two times a day (BID) | ORAL | Status: DC

## 2015-08-15 NOTE — Progress Notes (Addendum)
     Subjective: 2 Days Post-Op Procedure(s) (LRB): RIGHT  TOTAL KNEE ARTHROPLASTY (Right)   Patient reports pain as mild, pain controlled. No events throughout the night. Had a short discussion on possibly doing the left TKA in 6 months.  Ready to be discharged to skilled nursing facility.   Objective:   VITALS:   Filed Vitals:   08/15/15 0646  BP: 171/68  Pulse: 72  Temp: 98.3 F (36.8 C)  Resp: 16    Incision: dressing C/D/I No cellulitis present Compartment soft  LABS  Recent Labs  08/14/15 0450 08/15/15 0510  HGB 10.3* 9.3*  HCT 31.4* 28.5*  WBC 13.2* 9.2  PLT 162 152     Recent Labs  08/14/15 0450 08/15/15 0510  NA 137 137  K 5.3* 4.5  BUN 33* 33*  CREATININE 1.21* 0.98  GLUCOSE 320* 103*     Assessment/Plan: 2 Days Post-Op Procedure(s) (LRB): RIGHT  TOTAL KNEE ARTHROPLASTY (Right)  Dressing changed to gauze and tape Up with therapy Discharge to SNF  Follow up in 2 weeks at [facility]. Follow up with OLIN,Avanna Sowder D in 2 weeks.  Contact information:  [facility] [address] [place] Stephnie Parlier   PAC  08/15/2015, 8:19 AM

## 2015-08-15 NOTE — Discharge Summary (Signed)
Physician Discharge Summary  [name] MRN: [id number] DOB/AGE: [birth year] 79 y.o.  Admit date: 08/13/2015 Discharge date:  08/15/2015  Procedures:  Procedure(s) (LRB): RIGHT  TOTAL KNEE ARTHROPLASTY (Right)  Attending Physician:  Dr. Paralee Cancel   Admission Diagnoses:   Right knee primary OA / pain  Discharge Diagnoses:  Principal Problem:   S/P right TKA Active Problems:   S/P knee replacement  Past Medical History  Diagnosis Date  . Anemia   . Hypertension   . Iron deficiency anemia 05/27/2011  . CHF (congestive heart failure)     EF preserved Echo 2012  . 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 Carotid stenosis   . Renal insufficiency   . Furuncle of back, except buttock   . Vertigo     "when sugar gets low"  . CAD (coronary artery disease)   . Chronic kidney infection   . Arthritis     back, knees, and hips  . Myocardial infarction   . Balance problems   . Speech problem     from stroke  . Vision loss     left eye-"half blind"  . Hearing loss   . Shortness of breath dyspnea     with activity  . H/O seasonal allergies   . Pneumonia     hx of  . Asthma     with allergies  . Swallowing difficulty   . Diabetes mellitus     type 2  . Anxiety   . Depression   . GERD (gastroesophageal reflux disease)   . Dementia     "a little"    HPI:    [name], 81 y.o. female, has a history of pain and functional disability in the bilateral knees due to arthritis and has failed non-surgical conservative treatments for greater than 12 weeks to include NSAID's and/or analgesics, corticosteriod injections, use of assistive devices and activity modification. Onset of symptoms was gradual, starting 2+ years ago with gradually worsening course since that time. The patient noted no past surgery on the right knee(s). Patient currently rates pain in the right knee(s)  at 9 out of 10 with activity. Patient has night pain, worsening of pain with activity and weight bearing, pain that interferes with activities of daily living, pain with passive range of motion, crepitus and joint swelling. Patient has evidence of periarticular osteophytes and joint space narrowing by imaging studies. There is no active infection. Risks, benefits and expectations were discussed with the patient. Risks including but not limited to the risk of anesthesia, blood clots, nerve damage, blood vessel damage, failure of the prosthesis, infection and up to and including death. Patient understand the risks, benefits and expectations and wishes to proceed with surgery.   PCP: Sharion Balloon, FNP   Discharged Condition: good  Hospital Course:  Patient underwent the above stated procedure on 08/13/2015. Patient tolerated the procedure well and brought to the recovery room in good condition and subsequently to the floor.  POD #1 BP: 160/63 ; Pulse: 67 ; Temp: 98.4 F (36.9 C) ; Resp: 16 Patient reports pain as mild, pain well controlled. No events throughout the night otherwise.  Dorsiflexion/plantar flexion intact, incision: dressing C/D/I, no cellulitis present and compartment soft.   LABS  Basename    HGB  10.3  HCT  31.4   POD #2  BP: 171/68 ; Pulse: 72 ;  Temp: 98.3 F (36.8 C) ; Resp: 16 Patient reports pain as mild, pain controlled. No events throughout the night. Had a short discussion on possibly doing the left TKA in 6 months. Ready to be discharged to skilled nursing facility.  Dorsiflexion/plantar flexion intact, incision: dressing C/D/I, no cellulitis present and compartment soft.   LABS  Basename    HGB  9.3  HCT  28.5    Discharge Exam: General appearance: alert, cooperative and no distress Extremities: Homans sign is negative, no sign of DVT, no edema, redness or tenderness in the calves or thighs and no ulcers, gangrene or trophic changes  Disposition:      Skilled nursing facility with follow up in 2 weeks   Follow-up Information    Follow up with Mauri Pole, MD. Schedule an appointment as soon as possible for a visit in 2 weeks.   Specialty:  Orthopedic Surgery   Contact information:   [address] [place] [address] [id number]       Discharge Instructions    Call MD / Call 911    Complete by:  As directed   If you experience chest pain or shortness of breath, CALL 911 and be transported to the hospital emergency room.  If you develope a fever above 101 F, pus (white drainage) or increased drainage or redness at the wound, or calf pain, call your surgeon's office.     Change dressing    Complete by:  As directed   Maintain surgical dressing until follow up in the clinic. If the edges start to pull up, may reinforce with tape. If the dressing is no longer working, may remove and cover with gauze and tape, but must keep the area dry and clean.  Call with any questions or concerns.     Constipation Prevention    Complete by:  As directed   Drink plenty of fluids.  Prune juice may be helpful.  You may use a stool softener, such as Colace (over the counter) 100 mg twice a day.  Use MiraLax (over the counter) for constipation as needed.     Diet - low sodium heart healthy    Complete by:  As directed      Discharge instructions    Complete by:  As directed   Maintain surgical dressing until follow up in the clinic. If the edges start to pull up, may reinforce with tape. If the dressing is no longer working, may remove and cover with gauze and tape, but must keep the area dry and clean.  Follow up in 2 weeks at [facility]. Call with any questions or concerns.     Increase activity slowly as tolerated    Complete by:  As directed   Weight bearing as tolerated with assist device (walker, cane, etc) as directed, use it as long as suggested by your surgeon or therapist, typically at least 4-6 weeks.     TED  hose    Complete by:  As directed   Use stockings (TED hose) for 2 weeks on both leg(s).  You may remove them at night for sleeping.             Medication List    STOP taking these medications        Ibuprofen 200 MG Caps     nitrofurantoin (macrocrystal-monohydrate) 100 MG capsule  Commonly known as:  MACROBID      TAKE these medications  amLODipine 10 MG tablet  Commonly known as:  NORVASC  Take 1 tablet (10 mg total) by mouth daily.     aspirin 325 MG EC tablet  Take 1 tablet (325 mg total) by mouth daily.     atorvastatin 20 MG tablet  Commonly known as:  LIPITOR  Take 1 tablet (20 mg total) by mouth at bedtime.     clopidogrel 75 MG tablet  Commonly known as:  PLAVIX  Take 1 tablet (75 mg total) by mouth daily.     DAILY VITAMINS/IRON/BETA CAROT PO  Take 1 tablet by mouth at bedtime.     docusate sodium 100 MG capsule  Commonly known as:  COLACE  Take 1 capsule (100 mg total) by mouth 2 (two) times daily.     DRY EYES OP  Apply 1 drop to eye daily as needed. For dry eye/ red eye     EASY TOUCH INSULIN SYRINGE 31G X 5/16" 1 ML Misc  Generic drug:  Insulin Syringe-Needle U-100  USE AS DIRECTED.     esomeprazole 40 MG capsule  Commonly known as:  NEXIUM  Take 1 capsule (40 mg total) by mouth daily as needed (FO ACID REFLUX/GERD).     ferrous sulfate 325 (65 FE) MG tablet  Take 1 tablet (325 mg total) by mouth 3 (three) times daily after meals.     furosemide 20 MG tablet  Commonly known as:  LASIX  Take 1 tablet (20 mg total) by mouth daily as needed for fluid or edema.     HYDROcodone-acetaminophen 7.5-325 MG per tablet  Commonly known as:  NORCO  Take 1-2 tablets by mouth every 4 (four) hours as needed for moderate pain.     insulin NPH Human 100 UNIT/ML injection  Commonly known as:  HUMULIN N  Inject 15 units each morning with breakfast     insulin regular 100 units/mL injection  Commonly known as:  HUMULIN R  Humulin R sliding scale:  Blood sugar 120-150 3units                       151-200 4units                      201-250 7units                     251- 300 11units                     301- 350 15units                      351-400  20units                      > 400 call MD immdediately     isosorbide mononitrate 30 MG 24 hr tablet  Commonly known as:  IMDUR  Take 1 tablet (30 mg total) by mouth daily.     lisinopril 20 MG tablet  Commonly known as:  PRINIVIL,ZESTRIL  TAKE (1) TABLET BY MOUTH ONCE DAILY.     LORazepam 0.5 MG tablet  Commonly known as:  ATIVAN  TAKE (1) TABLET TWICE DAILY.     meclizine 25 MG tablet  Commonly known as:  ANTIVERT  TAKE 1 TABLET TWICE DAILY AS NEEDED FOR DIZZINESS.     metFORMIN 500 MG 24 hr tablet  Commonly known as:  GLUCOPHAGE XR  Take 1  tablet with breakfast and 1 tablet with supper     metoprolol 50 MG tablet  Commonly known as:  LOPRESSOR  Take 1 tablet (50 mg total) by mouth 2 (two) times daily.     nitroGLYCERIN 0.4 MG SL tablet  Commonly known as:  NITROSTAT  PLACE ONE (1) TABLET UNDER TONGUE EVERY 5 MINUTES UP TO (3) DOSES AS NEEDED FOR CHEST PAIN.     PARoxetine 20 MG tablet  Commonly known as:  PAXIL  Take 1 tablet (20 mg total) by mouth daily.     polyethylene glycol packet  Commonly known as:  MIRALAX / GLYCOLAX  Take 17 g by mouth 2 (two) times daily.     tiZANidine 4 MG tablet  Commonly known as:  ZANAFLEX  Take 1 tablet (4 mg total) by mouth every 6 (six) hours as needed for muscle spasms.         Signed: [place]. Demitrios Molyneux   PA-C  08/15/2015, 8:30 AM

## 2015-08-15 NOTE — Progress Notes (Signed)
Physical Therapy Treatment Patient Details Name: [name] MRN: [id number] DOB: 08/31/1931 Today's Date: 08/15/2015    History of Present Illness 81 yo female s/p R TKA 08/13/15    PT Comments    Progressing slowly with mobility. Plan is for d/c to snf on today.   Follow Up Recommendations  SNF     Equipment Recommendations  None recommended by PT    Recommendations for Other Services       Precautions / Restrictions Precautions Precautions: Fall;Knee Restrictions Weight Bearing Restrictions: No RLE Weight Bearing: Weight bearing as tolerated    Mobility  Bed Mobility Overal bed mobility: Needs Assistance Bed Mobility: Supine to Sit     Supine to sit: Min assist     General bed mobility comments: small amount of assist for R LE. Increased time.   Transfers Overall transfer level: Needs assistance Equipment used: Rolling walker (2 wheeled)   Sit to Stand: Min assist;From elevated surface         General transfer comment: assist to rise, stabilize, control descent. VCs safety, technique, hand placement.   Ambulation/Gait Ambulation/Gait assistance: Min assist Ambulation Distance (Feet): 18 Feet Assistive device: Rolling walker (2 wheeled) Gait Pattern/deviations: Step-to pattern;Trunk flexed;Antalgic     General Gait Details: Assist to stabilize and maneuver with RW. Mod VCs for safety, posture, sequence, step length, distance from walker. Fatigues quickly. Followed with recliner   Stairs            Wheelchair Mobility    Modified Rankin (Stroke Patients Only)       Balance                                    Cognition Arousal/Alertness: Awake/alert Behavior During Therapy: WFL for tasks assessed/performed Overall Cognitive Status: Within Functional Limits for tasks assessed                      Exercises Total Joint Exercises Ankle Circles/Pumps: AROM;Both;10 reps;Supine Quad Sets: AROM;Both;5  reps;Supine Hip ABduction/ADduction: AAROM;Right;10 reps;Supine Straight Leg Raises: AAROM;Right;10 reps;Supine Knee Flexion: AAROM;Right;10 reps;Seated Goniometric ROM: ~10-90 degrees    General Comments        Pertinent Vitals/Pain Pain Assessment: Faces Faces Pain Scale: Hurts little more Pain Location: R knee with activity Pain Descriptors / Indicators: Sore;Discomfort Pain Intervention(s): Ice applied;Monitored during session;Limited activity within patient's tolerance;Repositioned    Home Living                      Prior Function            PT Goals (current goals can now be found in the care plan section) Progress towards PT goals: Progressing toward goals    Frequency  7X/week    PT Plan Current plan remains appropriate    Co-evaluation             End of Session Equipment Utilized During Treatment: Gait belt Activity Tolerance: Patient limited by fatigue;Patient limited by pain Patient left: in chair;with call bell/phone within reach;with family/visitor present     Time: [id number] PT Time Calculation (min) (ACUTE ONLY): 22 min  Charges:  $Gait Training: 8-22 mins                    G Codes:      Weston Anna, MPT Pager: [phone]

## 2015-08-15 NOTE — Discharge Instructions (Signed)

## 2015-08-15 NOTE — Care Management Important Message (Signed)
Important Message  Patient Details  Name: [name] MRN: [id number] Date of Birth: [birth year]   Medicare Important Message Given:  [facility] notification given    Camillo Flaming 08/15/2015, 11:18 AMImportant Message  Patient Details  Name: [name] MRN: [id number] Date of Birth: 09-May-1931   Medicare Important Message Given:  Yes-second notification given    Camillo Flaming 08/15/2015, 11:18 AM

## 2015-08-15 NOTE — Clinical Social Work Placement (Signed)
   CLINICAL SOCIAL WORK PLACEMENT  NOTE  Date:  08/15/2015  Patient Details  Name: [name] MRN: [id number] Date of Birth: 28-Oct-1931  Clinical Social Work is seeking post-discharge placement for this patient at the [place] level of care (*CSW will initial, date and re-position this form in  chart as items are completed):  No   Patient/family provided with [place] Work Department's list of facilities offering this level of care within the geographic area requested by the patient (or if unable, by the patient's family).  Yes   Patient/family informed of their freedom to choose among providers that offer the needed level of care, that participate in Medicare, Medicaid or managed care program needed by the patient, have an available bed and are willing to accept the patient.  Yes   Patient/family informed of [facility]'s ownership interest in [facility] and [facility], as well as of the fact that they are under no obligation to receive care at these facilities.  PASRR submitted to EDS on 08/13/15     PASRR number received on 08/13/15     Existing PASRR number confirmed on       FL2 transmitted to all facilities in geographic area requested by pt/family on 08/14/15     FL2 transmitted to all facilities within larger geographic area on       Patient informed that his/her managed care company has contracts with or will negotiate with certain facilities, including the following:        Yes   Patient/family informed of bed offers received.  Patient chooses bed at [facility]     Physician recommends and patient chooses bed at      Patient to be transferred to [facility] on 08/15/15.  Patient to be transferred to facility by [facility]     Patient family notified on 08/15/15 of transfer.  Name of family member notified:  DAUGHTER     PHYSICIAN       Additional Comment: Pt / daughter are in agreement with d/c to  [facility] today. PT approved d/c by car. NSG reviewed d/c summary, scripts, avs. Scripts included in d/c packet. D/C Summary sent to SNF for review prior to d/c. D/C packet provided to pt prior to d/c.   _______________________________________________ Luretha Rued, LCSW 08/15/2015, 1:24 PM

## 2015-08-16 ENCOUNTER — Encounter: Payer: Self-pay | Admitting: Internal Medicine

## 2015-08-16 ENCOUNTER — Non-Acute Institutional Stay (SKILLED_NURSING_FACILITY): Payer: Medicare Other | Admitting: Internal Medicine

## 2015-08-16 DIAGNOSIS — I10 Essential (primary) hypertension: Secondary | ICD-10-CM

## 2015-08-16 DIAGNOSIS — E119 Type 2 diabetes mellitus without complications: Secondary | ICD-10-CM

## 2015-08-16 DIAGNOSIS — I5031 Acute diastolic (congestive) heart failure: Secondary | ICD-10-CM | POA: Diagnosis not present

## 2015-08-16 DIAGNOSIS — Z96651 Presence of right artificial knee joint: Secondary | ICD-10-CM | POA: Diagnosis not present

## 2015-08-16 DIAGNOSIS — Z794 Long term (current) use of insulin: Secondary | ICD-10-CM

## 2015-08-16 NOTE — Progress Notes (Signed)
[name], female   DOB: August 21, 1931, 81 y.o.   MRN: [id number]   This is an acute visit.  Level care skilled.  Facility [facility].  Chief complaint-acute visit status post hospitalization for right total knee replacement secondary to severe endstage all 30 arthritis.  History of present illness.  Patient is a pleasant 81 year old female with a history of severe right knee pain and functional disability secondary to arthritis.  Conservative measures pain management were not successful in controlling her pain and discomfort and improving her function significantly-and thus underwent a total knee replacement.  Apparently she tolerated the procedure well.  .  Her other medical medical conditions include. HF with a preserved ejection fraction per echo done in 2012  Also has a history CVA with some partial blindness in her left eye and some dysarthric speech.   historShe also has a history of type 2 diabetes he continues on Glucophage as well as Humulin R with meals and Humulin N 15 units in the morning.  Currently vital signs are stable she has no acute complaints-her family is concerned thinking the Norco does make her somewhat more confused and was wondering if there is another medication.  Family indicates she does not do well with NSAIDs-from what they tell me she had an anaphylactic reaction to [place] it appears she has been on ibuprofen in the past as well as Celebrex  Previous medical history.  Status post right TKA secondary to end-stage also arthritis.  Anemia.  Hypertension.  CHF with preserved ejection fraction per echo 2012.  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  Iron deficiency anemia.  Carotid stenosis.  Renal insufficiency.  For knuckle of back.  Vertigo when sugar gets low.  Coronary artery disease.  Myocardial infarction  history.  History see note allergies.  Diabetes type 2.  Anxiety.  Depression.  GERD.  Mild dementia.  Surgical history.  I history of incision and drainage of furuncle.  Status post CABG 2006.  Total abdominal hysterectomy when she was 81 years old with a tumor removal.  Cholecystectomy.  Appendectomy with hysterectomy.  Bilateral cataract extraction 5 years ago.  Medications.  Norvasc 10 mg daily.  Aspirin 325 mg daily.  Lipitor 20 mg daily at bedtime.  Plavix 75 mg daily.  Multivitamin daily.  Colace 100 mg twice a day.   dry eyes when necessary.  Nexium 40 mg daily when necessary.  Ferrous sulfate 325 mg 3 times a day.  Lasix 20 mg when necessary fluid or edema.  Norco 7.5-325 mg 1 or 2 tabs every 4 hours when necessary pain.  Humulin N insulin 15 units every morning with breakfast.  Humulin R sliding scale insulin.  Imdur 30 mg daily.  Lisinopril 20 mg daily.  Ativan 0.5 mg twice a day.  Antivert 25 mg twice a day when necessary dizziness.  Glucophage ex RR 500 mg 1 tablet with breakfast and 1 tablet with supper.  Lopressor 50 mg twice a day.  Nitroglycerin when necessary.  Paxil 20 mg daily.  MiraLAX twice a day.  Zanaflex 4 mg every 6 hours when necessary muscle spasms  Social history-no significant history of tobacco use alcohol or illicit drug use-I believe she lives with family who is quite supportive and they're in the room with her today   Family history  prostate cancer in a brother-brothers also with heart disease including MI.  Sister.  significant for diabetes-MI-osteoporosis and hypertension.As well as early death  Family history significant for  Review of systems.  In general does not complain of any fever or chills.  Skin does not complain of rashes or itching.  Head ears eyes nose mouth and throat does have partial left eye blindness has prescription lenses does not complain of pain or visual changes from  baseline.   Does not complaining of sore throat.  Respiratory does not complain of shortness breath or cough.  Cardiac no chest pain does have some mild lower extremity edema more so on the right.  GI does not complain of abdominal discomfort does complain of some constipation since she has not had a bowel movement in several days.  GU does not complain of dysuria.  Muscle skeletal is status post right knee replacement at this point does not really complain of pain family hasfaint some crease confusion when she takes the [place].  Neurologic does not complain of dizziness headache school-type feelings does have a history of CVA with some slight speech impairment balance issues also appears to have a mouth drooped.  Psych does have a history of anxiety and depression per family does not appear overtly anxious or depressed at bedside today.  Physical exam.  Temp is 90.1 pulse 72 respirations 18 blood pressure 132/64.  General this is a pleasant elderly resident in no distress sitting comfortably in her wheelchair.  Her skin is warm and dry areas a covering over her right knee surgical site I do not see any surrounding erythema or sign of infection there is some slight warmth which one would expect postop over the right knee.  Eyes she does have partial left eye blindness does not appear to have gross visual deficits otherwise she has prescription lenses.  Oropharynx clear mucous membranes moist.  Chest is clear to auscultation there is no labored breathing.  Heart is regular rate and rhythm without murmur gallop or rub she has mild right lower extremity edema there is a positive pedal pulse also has some edema postop of her right knee.  Abdomen soft obese soft nontender there are positive sounds.  Muscle skeletal she is sitting in a wheelchair upper extremity strength appears to relatively well-preserved lower extremity limited exam since patient is wheelchair is able to move her left  leg with antigravity strength it appears again limited exam of her right leg secondary to the recent knee surgery she is able to wiggle her toes capillary refill appears to be intact again pedal pulse is intact on the right as well.  Neurologic she does have slightly dysarthric speech with a mouth drooped otherwise could not really appreciate lateralizing findings of significance.  Psych she appears grossly alert and oriented to date year-and place she is pleasant and appropriate although per family she does have periods of confusion they suspect mild dementia.  Labs.  08/15/2015.  WBC 9.2 hemoglobin 9.3 platelets 152.  Sodium 137 potassium 4.5 BUN 33 creatinine 0.98  Assessment and plan.  1 history of right total knee replacement-at this point appears stable she is followed by orthopedics she is on aspirin for anticoagulation-family feels she has increased confusion she takes the Norco will discontinue this and start tramadol and monitor for any similar symptoms--she also has an order for Zanaflex as needed.  History CHF she does have a preserved ejection fraction per most recent echo per discharge report-suspect there is an element of diastolic dysfunction she has Lasix when necessary at this point appears stable  clinically-her weights will have to be monitored carefully notify provider of gain greater than 3 pounds.  #3 history of CVA again she is on anticoagulation with aspirin appears to have some efforts it's including speech visual changes and balance issues-at this point will monitor she appears to be at her baseline today.  #4 history of coronary artery disease again she does continue on aspirin she is also on Plavix and a statin at this point appears to be stable. She is on a beta blocker as well as an ACE inhibitor--she also has Imdur daily and nitroglycerin when necessary  #5-history of diabetes type 2 so far CBGs are minimal I see 128 and 160 this appears to be relatively stable  she is on Humulin N 15 units every morning as well as sliding scale insulin and Glucophage twice a day at this point will monitor.  #6-history depression at this point appears to be stable she is on Paxil.  #7 history of anxiety she does have Ativan when necessary this will have to be watched as well.  History of GERD she does continue on Nexium as needed.  #9 history hypertension she does continue on metoprolol lisinopril as well as Norvasc recent blood pressures 132/64 I do see 160/68 as well will continue to monitor for now.  #10 history of constipation she is on MiraLAX as well as Colace-she will have to be checked for impaction suspect she may need an enema clinically does not appear to be unstable here for an acute abdominal distress certainly.  11 history of vertigo balance issues she does have Antivert as needed when necessary.  #12 history of anemia listed as iron deficiency she is on iron Will update a CBC for updated values.  Of note also will write an order to add NSAIDs as an allergy I suspect we may need more clarification from family since it appears she has tolerated Celebrex and ibuprofen in the past although they state some history of an anaphylactic reaction here     [id number]-of note greater than 45 minutes spent assessing patient-reviewing her chart-discussing her status with her family at bedside-and coordinating and formulating a plan of care for numerous diagnoses-of note greater than 50% of time spent coordinating plan of care .

## 2015-08-17 ENCOUNTER — Other Ambulatory Visit (HOSPITAL_COMMUNITY)
Admission: RE | Admit: 2015-08-17 | Discharge: 2015-08-17 | Disposition: A | Discharge status: Home / Self Care | Payer: Medicare Other | Source: Skilled Nursing Facility | Attending: Internal Medicine | Admitting: Internal Medicine

## 2015-08-17 DIAGNOSIS — I5032 Chronic diastolic (congestive) heart failure: Secondary | ICD-10-CM | POA: Insufficient documentation

## 2015-08-17 LAB — CBC WITH DIFFERENTIAL/PLATELET
Basophils Absolute: 0 10*3/uL (ref 0.0–0.1)
Basophils Relative: 0 % (ref 0–1)
EOS ABS: 0.3 10*3/uL (ref 0.0–0.7)
Eosinophils Relative: 4 % (ref 0–5)
HCT: 25.7 % — ABNORMAL LOW (ref 36.0–46.0)
HEMOGLOBIN: 8.8 g/dL — AB (ref 12.0–15.0)
LYMPHS ABS: 1.7 10*3/uL (ref 0.7–4.0)
Lymphocytes Relative: 25 % (ref 12–46)
MCH: 30.3 pg (ref 26.0–34.0)
MCHC: 34.2 g/dL (ref 30.0–36.0)
MCV: 88.6 fL (ref 78.0–100.0)
MONOS PCT: 9 % (ref 3–12)
Monocytes Absolute: 0.6 10*3/uL (ref 0.1–1.0)
NEUTROS PCT: 62 % (ref 43–77)
Neutro Abs: 4.3 10*3/uL (ref 1.7–7.7)
Platelets: 168 10*3/uL (ref 150–400)
RBC: 2.9 MIL/uL — ABNORMAL LOW (ref 3.87–5.11)
RDW: 14.4 % (ref 11.5–15.5)
WBC: 6.9 10*3/uL (ref 4.0–10.5)

## 2015-08-17 LAB — BASIC METABOLIC PANEL
Anion gap: 6 (ref 5–15)
BUN: 24 mg/dL — AB (ref 6–20)
CHLORIDE: 105 mmol/L (ref 101–111)
CO2: 27 mmol/L (ref 22–32)
CREATININE: 0.87 mg/dL (ref 0.44–1.00)
Calcium: 8.9 mg/dL (ref 8.9–10.3)
GFR calc non Af Amer: >60 mL/min (ref >60)
Glucose, Bld: 94 mg/dL (ref 65–99)
POTASSIUM: 3.9 mmol/L (ref 3.5–5.1)
Sodium: 138 mmol/L (ref 135–145)

## 2015-08-18 ENCOUNTER — Non-Acute Institutional Stay (SKILLED_NURSING_FACILITY): Payer: Medicare Other | Admitting: Internal Medicine

## 2015-08-18 DIAGNOSIS — Z96651 Presence of right artificial knee joint: Secondary | ICD-10-CM

## 2015-08-18 DIAGNOSIS — E119 Type 2 diabetes mellitus without complications: Secondary | ICD-10-CM | POA: Diagnosis not present

## 2015-08-18 DIAGNOSIS — I6990 Unspecified sequelae of unspecified cerebrovascular disease: Secondary | ICD-10-CM | POA: Diagnosis not present

## 2015-08-18 DIAGNOSIS — Z794 Long term (current) use of insulin: Secondary | ICD-10-CM

## 2015-08-18 DIAGNOSIS — I10 Essential (primary) hypertension: Secondary | ICD-10-CM | POA: Diagnosis not present

## 2015-08-18 DIAGNOSIS — I6930 Unspecified sequelae of cerebral infarction: Secondary | ICD-10-CM

## 2015-08-18 NOTE — Progress Notes (Signed)
[name], female   DOB: 1931/08/25, 81 y.o.   MRN: [id number]     Facility; [facility] Chief complaint; admission to SNF post admit to [facility] from 9/6 to 08/15/2015  History; this is an 81 year old woman who lives with her daughter and [place]. Normally uses a walker at home somewhat unsteady and ambulating but nevertheless reasonably independent with ADLs. She was admitted electively for a right total knee replacement due to refractory pain in the right knee. It appears that in spite of a multitude of medical issues she seems to have come through the surgery quite well. She has a history of coronary artery disease, congestive heart failure and has had the series of strokes according to her daughter although all of this appears to be stable. She is here for rehabilitation before returning home. The patient is a type II diabetic on insulin at home. Hemoglobin A1c was 7.9 in mid August.  CBC Latest Ref Rng 08/17/2015 08/15/2015 08/14/2015  WBC 4.0 - 10.5 K/uL 6.9 9.2 13.2(H)  Hemoglobin 12.0 - 15.0 g/dL 8.8(L) 9.3(L) 10.3(L)  Hematocrit 36.0 - 46.0 % 25.7(L) 28.5(L) 31.4(L)  Platelets 150 - 400 K/uL 168 152 162    BMP Latest Ref Rng 08/17/2015 08/15/2015 08/14/2015  Glucose 65 - 99 mg/dL 94 103(H) 320(H)  BUN 6 - 20 mg/dL 24(H) 33(H) 33(H)  Creatinine 0.44 - 1.00 mg/dL 0.87 0.98 1.21(H)  BUN/Creat Ratio 11 - 26 - - -  Sodium 135 - 145 mmol/L 138 137 137  Potassium 3.5 - 5.1 mmol/L 3.9 4.5 5.3(H)  Chloride 101 - 111 mmol/L 105 104 105  CO2 22 - 32 mmol/L 27 26 25   Calcium 8.9 - 10.3 mg/dL 8.9 9.4 9.3   Past Medical History  Diagnosis Date  . Anemia   . Hypertension   . Iron deficiency anemia 05/27/2011  . CHF (congestive heart failure)     EF preserved Echo 2012  . 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 Carotid stenosis   . Renal insufficiency   . Furuncle of back, except buttock   .  Vertigo     "when sugar gets low"  . CAD (coronary artery disease)   . Chronic kidney infection   . Arthritis     back, knees, and hips  . Myocardial infarction   . Balance problems   . Speech problem     from stroke  . Vision loss     left eye-"half blind"  . Hearing loss   . Shortness of breath dyspnea     with activity  . H/O seasonal allergies   . Pneumonia     hx of  . Asthma     with allergies  . Swallowing difficulty   . Diabetes mellitus     type 2  . Anxiety   . Depression   . GERD (gastroesophageal reflux disease)   . Dementia     "a little"   Past Surgical History  Procedure Laterality Date  . I & d of furuncle  April 2013  . Coronary artery bypass graft  2006  . Coronary angioplasty      prior to 2006 5 stents  . Total abdominal hysterectomy  ~81 years old    complete, with tumor removal  . Cholecystectomy    . Appendectomy      with hysterectomy  . Cataract extraction Bilateral 5 years  ago  . Total knee arthroplasty Right 08/13/2015    Procedure: RIGHT  TOTAL KNEE ARTHROPLASTY;  Surgeon: Paralee Cancel, MD;  Location: [facility];  Service: Orthopedics;  Laterality: Right;    Current Outpatient Prescriptions on File Prior to Visit  Medication Sig Dispense Refill  . amLODipine (NORVASC) 10 MG tablet Take 1 tablet (10 mg total) by mouth daily. 30 tablet 11  . Artificial Tear Ointment (DRY EYES OP) Apply 1 drop to eye daily as needed. For dry eye/ red eye     . aspirin EC 325 MG EC tablet Take 1 tablet (325 mg total) by mouth daily. 30 tablet 0  . atorvastatin (LIPITOR) 20 MG tablet Take 1 tablet (20 mg total) by mouth at bedtime. 30 tablet 11  . clopidogrel (PLAVIX) 75 MG tablet Take 1 tablet (75 mg total) by mouth daily. 30 tablet 11  . docusate sodium (COLACE) 100 MG capsule Take 1 capsule (100 mg total) by mouth 2 (two) times daily. 10 capsule 0  . EASY TOUCH INSULIN SYRINGE 31G X 5/16" 1 ML MISC USE AS DIRECTED. (Patient not taking: Reported on 08/02/2015) 80  each 2  . esomeprazole (NEXIUM) 40 MG capsule Take 1 capsule (40 mg total) by mouth daily as needed (FO ACID REFLUX/GERD). 30 capsule 11  . ferrous sulfate 325 (65 FE) MG tablet Take 1 tablet (325 mg total) by mouth 3 (three) times daily after meals.  3  . furosemide (LASIX) 20 MG tablet Take 1 tablet (20 mg total) by mouth daily as needed for fluid or edema. 30 tablet 5  . HYDROcodone-acetaminophen (NORCO) 7.5-325 MG per tablet Take 1-2 tablets by mouth every 4 (four) hours as needed for moderate pain. 100 tablet 0  . insulin NPH Human (HUMULIN N) 100 UNIT/ML injection Inject 15 units each morning with breakfast (Patient taking differently: Inject 63 Units into the skin daily with breakfast. ) 40 mL 11  . insulin regular (HUMULIN R) 100 units/mL injection Humulin R sliding scale: Blood sugar 120-150 3units                       151-200 4units                      201-250 7units                     251- 300 11units                     301- 350 15units                      351-400  20units                      > 400 call MD immdediately (Patient taking differently: Inject 3-20 Units into the skin 3 (three) times daily as needed (SLIDING SCALE LISTED). Humulin R sliding scale: Blood sugar 120-150 3units                       151-200 4units                      201-250 7units                     251- 300 11units  301- 350 15units                      351-400  20units                      > 400 call MD immdediately) 10 mL 11  . isosorbide mononitrate (IMDUR) 30 MG 24 hr tablet Take 1 tablet (30 mg total) by mouth daily. 30 tablet 5  . lisinopril (PRINIVIL,ZESTRIL) 20 MG tablet TAKE (1) TABLET BY MOUTH ONCE DAILY. 30 tablet 5  . LORazepam (ATIVAN) 0.5 MG tablet TAKE (1) TABLET TWICE DAILY. 40 tablet 0  . meclizine (ANTIVERT) 25 MG tablet TAKE 1 TABLET TWICE DAILY AS NEEDED FOR DIZZINESS. (Patient taking differently: TAKE 1 TABLET TWICE DAILY FOR DIZZINESS.) 60 tablet 2  .  metFORMIN (GLUCOPHAGE XR) 500 MG 24 hr tablet Take 1 tablet with breakfast and 1 tablet with supper 60 tablet 11  . metoprolol (LOPRESSOR) 50 MG tablet Take 1 tablet (50 mg total) by mouth 2 (two) times daily. 60 tablet 11  . Multiple Vitamins-Iron (DAILY VITAMINS/IRON/BETA CAROT PO) Take 1 tablet by mouth at bedtime.    . nitroGLYCERIN (NITROSTAT) 0.4 MG SL tablet PLACE ONE (1) TABLET UNDER TONGUE EVERY 5 MINUTES UP TO (3) DOSES AS NEEDED FOR CHEST PAIN. 25 tablet 2  . PARoxetine (PAXIL) 20 MG tablet Take 1 tablet (20 mg total) by mouth daily. 30 tablet 11  . polyethylene glycol (MIRALAX / GLYCOLAX) packet Take 17 g by mouth 2 (two) times daily. 14 each 0  . tiZANidine (ZANAFLEX) 4 MG tablet Take 1 tablet (4 mg total) by mouth every 6 (six) hours as needed for muscle spasms. 40 tablet 0    Social history; she lives with her daughter in [place]. Uses a walker. There are 2 stairs. Balance is described as being very poor but no fall history. Daughter manages her insulin Humulin N. That I am not sure whether they were using short acting insulin at home as well or just adjusting the Humulin N  on some form of sliding scale  reports that she has never smoked. She has never used smokeless tobacco. She reports that she does not drink alcohol or use illicit drugs.   fam hx indicated that her mother is deceased. She indicated that only one of her five sisters is alive. She indicated that all of her five brothers are deceased.    Review of systems; Gen; no weight loss HEENT; no lower teeth Respiratory; no shortness of breath, no cough, Cardiac; no exertional chest pain, no palpitations GI; no abnormal pain, no change in bowel habits. No swallowing difficulties GU; no dysuria, no voiding difficulties Musculoskeletal; minor postoperative pain in the right knee. Left knee and both shoulders seem functional Neurologic; gait imbalance as noted secondary to a history of strokes. She also has  vision loss in the left eye Mental status; no history of any issues here   Physical examination; Vitals; O2 sat is 95% on room air. Respiratory rate 18 and unlabored, pulse 64 Gen.; the patient does not appear to be in any distress HEENT; no oral lesions were seen Respiratory; clear air entry bilaterally, no wheezing,  Cardiac; S1-S2 normal, no gallops, no murmurs, jugular venous pressure is not elevated. There are no carotid bruits. CABG scar noted. Abdomen; no liver no spleen, no tenderness, no masses GU; bladder is not distended there is no CVA tenderness Extremities; some edema noted in the  right leg however I had there is no real evidence of a DVT Neurologic; she is able to move all her limbs, good strength bilaterally. Speech is somewhat dysarthric but easily understandable. Mental status; I see no abnormalities here patient is able to give her own history. No overt depression or delirium  Impression/plan #1 status post right total knee replacement which was done electively. She seems to come through this nicely. Used a walker previously at home and was apparently independent. #2 history of coronary artery disease status post CABG and diastolic heart failure. I see none of this at the bedside she is on Lasix 20 mg. 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 She was on Humulin N 63 units in the morning and some form of sliding scale like couldn't really follow. She comes out on 15 units. I'll need to follow-up on this tomorrow she has an aggressive Humulin R sliding scale here. She was on metformin 500 a day, she is on the XRT-here 500 twice a day #4 history of cerebrovascular disease with gait ataxia; she is on both Plavix and adult strength aspirin #5 hyperlipidemia on Lipitor. She should not be here long enough for this to be a concern #6 hypertension on amlodipine, lisinopril, Imdur line #7 history of depression on Paxil I see none of this here  currently #7 on ferrous sulfate 325 3 times a day I am uncertain of her anemia history here whether this was just started postoperatively. #8 history of depression this seems stable currently on Paxil  Major issue will be following up on her blood sugars on a markedly reduced amount of Humulin N postoperatively. Check a CBC in 3 or 4 days.

## 2015-08-19 ENCOUNTER — Other Ambulatory Visit: Payer: Self-pay | Admitting: *Deleted

## 2015-08-19 MED ORDER — TRAMADOL HCL 50 MG PO TABS: ORAL_TABLET | ORAL | Status: DC

## 2015-08-19 NOTE — Telephone Encounter (Signed)
[facility] 

## 2015-08-21 ENCOUNTER — Non-Acute Institutional Stay (SKILLED_NURSING_FACILITY): Payer: Medicare Other | Admitting: Internal Medicine

## 2015-08-21 ENCOUNTER — Encounter (HOSPITAL_COMMUNITY)
Admission: AD | Admit: 2015-08-21 | Discharge: 2015-08-21 | Disposition: A | Discharge status: Home / Self Care | Payer: Medicare Other | Source: Skilled Nursing Facility | Attending: Internal Medicine | Admitting: Internal Medicine

## 2015-08-21 DIAGNOSIS — E119 Type 2 diabetes mellitus without complications: Secondary | ICD-10-CM | POA: Diagnosis not present

## 2015-08-21 DIAGNOSIS — Z794 Long term (current) use of insulin: Principal | ICD-10-CM

## 2015-08-21 LAB — CBC
HCT: 26.7 % — ABNORMAL LOW (ref 36.0–46.0)
Hemoglobin: 9.1 g/dL — ABNORMAL LOW (ref 12.0–15.0)
MCH: 29.8 pg (ref 26.0–34.0)
MCHC: 34.1 g/dL (ref 30.0–36.0)
MCV: 87.5 fL (ref 78.0–100.0)
PLATELETS: 253 10*3/uL (ref 150–400)
RBC: 3.05 MIL/uL — AB (ref 3.87–5.11)
RDW: 13.7 % (ref 11.5–15.5)
WBC: 7.9 10*3/uL (ref 4.0–10.5)

## 2015-08-26 NOTE — Progress Notes (Signed)
[name], female   DOB: Aug 28, 1931, 81 y.o.   MRN: [id number]                PROGRESS NOTE  DATE:  08/21/2015           FACILITY: [facility]                        LEVEL OF CARE:   SNF   Acute Visit               CHIEF COMPLAINT:  Follow up anemia, diabetes.     HISTORY OF PRESENT ILLNESS:  This is an 81 year-old who underwent an elective right total knee replacement.      When I admitted her two days ago, I noted a hemoglobin fall from 10.3 on 08/14/2015 to 8.8 on 08/17/2015.  Her repeat CBC shows a hemoglobin of 9.1.  This is stable.    She also was on a complicated NPH regimen at home, including 63 U of NPH in the morning (Humulin N) and then a Humulin N sliding scale at night.  She comes out on 15 U of Humulin N and a Humulin R aggressive sliding scale starting at 120.  Looking at her blood sugars, yesterday they were 191/319/185.  She was 198 this morning.    REVIEW OF SYSTEMS:    GENERAL:  The patient is not running a fever.   CHEST/RESPIRATORY:  No cough.  No sputum.    CARDIAC:  No chest pain.   GI:  No nausea, vomiting, or diarrhea.      MUSCULOSKELETAL:  Extremities:  States her knee is still painful but, otherwise, she is stable.      Physical Exam General: the patient is not in any distress.  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  I am going to increase her a.m. Humulin N to 25 U.  She is still on an aggressive sliding scale.  However, I will maintain this for now.  I am thinking she will probably need some a.c. dinner Humulin N, as well.      CPT CODE: 40347

## 2015-09-04 ENCOUNTER — Other Ambulatory Visit: Payer: Self-pay | Admitting: Family Medicine

## 2015-09-12 ENCOUNTER — Encounter (HOSPITAL_COMMUNITY)
Admission: AD | Admit: 2015-09-12 | Discharge: 2015-09-12 | Disposition: A | Discharge status: Home / Self Care | Payer: Medicare Other | Source: Skilled Nursing Facility | Attending: Internal Medicine | Admitting: Internal Medicine

## 2015-09-12 DIAGNOSIS — R1030 Lower abdominal pain, unspecified: Secondary | ICD-10-CM | POA: Insufficient documentation

## 2015-09-12 DIAGNOSIS — N390 Urinary tract infection, site not specified: Secondary | ICD-10-CM | POA: Insufficient documentation

## 2015-09-12 DIAGNOSIS — I10 Essential (primary) hypertension: Secondary | ICD-10-CM | POA: Insufficient documentation

## 2015-09-12 DIAGNOSIS — E119 Type 2 diabetes mellitus without complications: Secondary | ICD-10-CM | POA: Insufficient documentation

## 2015-09-12 DIAGNOSIS — I5032 Chronic diastolic (congestive) heart failure: Secondary | ICD-10-CM | POA: Insufficient documentation

## 2015-09-12 LAB — URINALYSIS, ROUTINE W REFLEX MICROSCOPIC
BILIRUBIN URINE: NEGATIVE
GLUCOSE, UA: NEGATIVE mg/dL
HGB URINE DIPSTICK: NEGATIVE
KETONES UR: NEGATIVE mg/dL
Leukocytes, UA: NEGATIVE
Nitrite: NEGATIVE
PROTEIN: NEGATIVE mg/dL
Specific Gravity, Urine: 1.02 (ref 1.005–1.030)
UROBILINOGEN UA: 0.2 mg/dL (ref 0.0–1.0)
pH: 5.5 (ref 5.0–8.0)

## 2015-09-13 LAB — URINE CULTURE

## 2015-09-16 ENCOUNTER — Non-Acute Institutional Stay (SKILLED_NURSING_FACILITY): Payer: Medicare Other | Admitting: Internal Medicine

## 2015-09-16 ENCOUNTER — Encounter (HOSPITAL_COMMUNITY)
Admission: RE | Admit: 2015-09-16 | Discharge: 2015-09-16 | Disposition: A | Discharge status: Home / Self Care | Payer: Medicare Other | Source: Skilled Nursing Facility | Attending: Internal Medicine | Admitting: Internal Medicine

## 2015-09-16 ENCOUNTER — Other Ambulatory Visit (HOSPITAL_COMMUNITY)
Admission: AD | Admit: 2015-09-16 | Discharge: 2015-09-16 | Disposition: A | Discharge status: Home / Self Care | Payer: Medicare Other | Source: Skilled Nursing Facility | Attending: Internal Medicine | Admitting: Internal Medicine

## 2015-09-16 DIAGNOSIS — N179 Acute kidney failure, unspecified: Secondary | ICD-10-CM

## 2015-09-16 DIAGNOSIS — R410 Disorientation, unspecified: Secondary | ICD-10-CM

## 2015-09-16 DIAGNOSIS — G8929 Other chronic pain: Secondary | ICD-10-CM | POA: Diagnosis not present

## 2015-09-16 DIAGNOSIS — R1010 Upper abdominal pain, unspecified: Secondary | ICD-10-CM

## 2015-09-16 DIAGNOSIS — I10 Essential (primary) hypertension: Secondary | ICD-10-CM | POA: Insufficient documentation

## 2015-09-16 DIAGNOSIS — R1011 Right upper quadrant pain: Secondary | ICD-10-CM

## 2015-09-16 LAB — BASIC METABOLIC PANEL
ANION GAP: 10 (ref 5–15)
BUN: 44 mg/dL — ABNORMAL HIGH (ref 6–20)
CALCIUM: 10.1 mg/dL (ref 8.9–10.3)
CO2: 23 mmol/L (ref 22–32)
Chloride: 103 mmol/L (ref 101–111)
Creatinine, Ser: 1.99 mg/dL — ABNORMAL HIGH (ref 0.44–1.00)
GFR calc Af Amer: 26 mL/min — ABNORMAL LOW (ref >60)
GFR calc non Af Amer: 22 mL/min — ABNORMAL LOW (ref >60)
GLUCOSE: 156 mg/dL — AB (ref 65–99)
Potassium: 4.6 mmol/L (ref 3.5–5.1)
Sodium: 136 mmol/L (ref 135–145)

## 2015-09-16 LAB — CBC
HEMATOCRIT: 31.6 % — AB (ref 36.0–46.0)
HEMOGLOBIN: 10.2 g/dL — AB (ref 12.0–15.0)
MCH: 28.7 pg (ref 26.0–34.0)
MCHC: 32.3 g/dL (ref 30.0–36.0)
MCV: 88.8 fL (ref 78.0–100.0)
Platelets: 184 10*3/uL (ref 150–400)
RBC: 3.56 MIL/uL — ABNORMAL LOW (ref 3.87–5.11)
RDW: 13.6 % (ref 11.5–15.5)
WBC: 8.3 10*3/uL (ref 4.0–10.5)

## 2015-09-16 LAB — URINALYSIS, ROUTINE W REFLEX MICROSCOPIC
Bilirubin Urine: NEGATIVE
GLUCOSE, UA: NEGATIVE mg/dL
HGB URINE DIPSTICK: NEGATIVE
Ketones, ur: NEGATIVE mg/dL
Leukocytes, UA: NEGATIVE
Nitrite: NEGATIVE
Protein, ur: NEGATIVE mg/dL
SPECIFIC GRAVITY, URINE: 1.025 (ref 1.005–1.030)
Urobilinogen, UA: 0.2 mg/dL (ref 0.0–1.0)
pH: 5.5 (ref 5.0–8.0)

## 2015-09-17 ENCOUNTER — Ambulatory Visit (HOSPITAL_COMMUNITY): Payer: Medicare Other

## 2015-09-17 ENCOUNTER — Encounter (HOSPITAL_COMMUNITY)
Admission: RE | Admit: 2015-09-17 | Discharge: 2015-09-17 | Disposition: A | Discharge status: Home / Self Care | Payer: Medicare Other | Source: Skilled Nursing Facility | Attending: Internal Medicine | Admitting: Internal Medicine

## 2015-09-17 ENCOUNTER — Non-Acute Institutional Stay (SKILLED_NURSING_FACILITY): Payer: Medicare Other | Admitting: Internal Medicine

## 2015-09-17 ENCOUNTER — Encounter: Payer: Self-pay | Admitting: Internal Medicine

## 2015-09-17 ENCOUNTER — Ambulatory Visit (HOSPITAL_COMMUNITY)
Admit: 2015-09-17 | Discharge: 2015-09-17 | Disposition: A | Discharge status: Home / Self Care | Payer: Medicare Other | Attending: Internal Medicine | Admitting: Internal Medicine

## 2015-09-17 DIAGNOSIS — N289 Disorder of kidney and ureter, unspecified: Secondary | ICD-10-CM

## 2015-09-17 DIAGNOSIS — R600 Localized edema: Secondary | ICD-10-CM | POA: Diagnosis not present

## 2015-09-17 DIAGNOSIS — M79604 Pain in right leg: Secondary | ICD-10-CM | POA: Diagnosis not present

## 2015-09-17 DIAGNOSIS — N189 Chronic kidney disease, unspecified: Secondary | ICD-10-CM | POA: Diagnosis not present

## 2015-09-17 DIAGNOSIS — G9341 Metabolic encephalopathy: Secondary | ICD-10-CM | POA: Insufficient documentation

## 2015-09-17 DIAGNOSIS — R4182 Altered mental status, unspecified: Secondary | ICD-10-CM | POA: Diagnosis not present

## 2015-09-17 DIAGNOSIS — Z96651 Presence of right artificial knee joint: Secondary | ICD-10-CM | POA: Diagnosis not present

## 2015-09-17 DIAGNOSIS — M25461 Effusion, right knee: Secondary | ICD-10-CM | POA: Diagnosis not present

## 2015-09-17 LAB — CBC WITH DIFFERENTIAL/PLATELET
BASOS ABS: 0 10*3/uL (ref 0.0–0.1)
Basophils Relative: 1 %
Eosinophils Absolute: 0.1 10*3/uL (ref 0.0–0.7)
Eosinophils Relative: 2 %
HEMATOCRIT: 33 % — AB (ref 36.0–46.0)
HEMOGLOBIN: 10.8 g/dL — AB (ref 12.0–15.0)
LYMPHS PCT: 26 %
Lymphs Abs: 1.7 10*3/uL (ref 0.7–4.0)
MCH: 29 pg (ref 26.0–34.0)
MCHC: 32.7 g/dL (ref 30.0–36.0)
MCV: 88.7 fL (ref 78.0–100.0)
MONO ABS: 0.5 10*3/uL (ref 0.1–1.0)
MONOS PCT: 7 %
NEUTROS ABS: 4.2 10*3/uL (ref 1.7–7.7)
Neutrophils Relative %: 64 %
Platelets: 197 10*3/uL (ref 150–400)
RBC: 3.72 MIL/uL — ABNORMAL LOW (ref 3.87–5.11)
RDW: 13.6 % (ref 11.5–15.5)
WBC: 6.5 10*3/uL (ref 4.0–10.5)

## 2015-09-17 LAB — COMPREHENSIVE METABOLIC PANEL WITH GFR
ALT: 11 U/L — ABNORMAL LOW (ref 14–54)
AST: 15 U/L (ref 15–41)
Albumin: 3.5 g/dL (ref 3.5–5.0)
Alkaline Phosphatase: 51 U/L (ref 38–126)
Anion gap: 6 (ref 5–15)
BUN: 32 mg/dL — ABNORMAL HIGH (ref 6–20)
CO2: 27 mmol/L (ref 22–32)
Calcium: 10.2 mg/dL (ref 8.9–10.3)
Chloride: 103 mmol/L (ref 101–111)
Creatinine, Ser: 1.34 mg/dL — ABNORMAL HIGH (ref 0.44–1.00)
GFR calc Af Amer: 42 mL/min — ABNORMAL LOW
GFR calc non Af Amer: 36 mL/min — ABNORMAL LOW
Glucose, Bld: 296 mg/dL — ABNORMAL HIGH (ref 65–99)
Potassium: 5.7 mmol/L — ABNORMAL HIGH (ref 3.5–5.1)
Sodium: 136 mmol/L (ref 135–145)
Total Bilirubin: 0.4 mg/dL (ref 0.3–1.2)
Total Protein: 6.7 g/dL (ref 6.5–8.1)

## 2015-09-17 LAB — AMMONIA: Ammonia: <9 umol/L — ABNORMAL LOW (ref 9–35)

## 2015-09-17 LAB — URINE CULTURE: Culture: NO GROWTH

## 2015-09-17 LAB — BASIC METABOLIC PANEL
Anion gap: 6 (ref 5–15)
BUN: 34 mg/dL — AB (ref 6–20)
CALCIUM: 10.1 mg/dL (ref 8.9–10.3)
CHLORIDE: 102 mmol/L (ref 101–111)
CO2: 26 mmol/L (ref 22–32)
CREATININE: 1.53 mg/dL — AB (ref 0.44–1.00)
GFR calc non Af Amer: 31 mL/min — ABNORMAL LOW (ref >60)
GFR, EST AFRICAN AMERICAN: 36 mL/min — AB (ref >60)
GLUCOSE: 332 mg/dL — AB (ref 65–99)
Potassium: 5.2 mmol/L — ABNORMAL HIGH (ref 3.5–5.1)
Sodium: 134 mmol/L — ABNORMAL LOW (ref 135–145)

## 2015-09-17 LAB — TSH: TSH: 1.707 u[IU]/mL (ref 0.350–4.500)

## 2015-09-17 NOTE — Progress Notes (Signed)
[name], female   DOB: 06/25/1931, 81 y.o.   MRN: [id number]     This is an acute visit.  Level care skilled.  Facility [facility].  Chief complaint-acute visit secondary to right knee concerns-altered mental status   History of present illness.  Patient is a pleasant 81 year old female with a history of severe right knee pain and functional disability secondary to arthritis.  Conservative measures pain management were not successful in controlling her pain and discomfort and improving her function significantly-and thus underwent a total knee replacement.  Apparently she tolerated the procedure well. Other issues include CHF with preserved ejection fraction history CVA with left eye blindness and chronic renal insufficiency.  Dr. Dellia Nims saw her yesterday for a creatinine which had risen to 1.99 baseline appears to be around 1-he did make changes including holding her Lasix lisinopril and Glucophage.  Family feels that over the past 4 or 5 days she has had some altered mental status increased agitation-there may be some social issues here with patient apparently believing family wants her to stay in the facility permanently and this leads to agitation.  Dr. Dellia Nims did order a UA CNS yesterday so far results appear to be unremarkable-her vital signs are stable she is afebrile.  Nursing staff has noted some edema to her right knee there is some tenderness and warmth to the area as well-.  Currently patient is agitated somewhat more so with family members in the room-.  She is cooperative with exam but appears agitated which is unlike her presentation previously when I saw her.    .    Previous medical history.  Status post right TKA secondary to end-stage also arthritis.  Anemia.  Hypertension.  CHF with preserved ejection fraction per echo 2012.  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  Iron deficiency anemia.  Carotid stenosis.  Renal insufficiency.  Vertigo when sugar gets low.  Coronary artery disease.  Myocardial infarction history.  History see note allergies.  Diabetes type 2.  Anxiety.  Depression.  GERD.  Mild dementia.  Surgical history.  I history of incision and drainage of furuncle.  Status post CABG 2006.  Total abdominal hysterectomy when she was 81 years old with a tumor removal.  Cholecystectomy.  Appendectomy with hysterectomy.  Bilateral cataract extraction 5 years ago.  Medications.  Norvasc 10 mg daily.  Aspirin 325 mg daily.  Lipitor 20 mg daily at bedtime.  Plavix 75 mg daily.  Multivitamin daily.  Colace 100 mg twice a day.   dry eyes when necessary.  Nexium 40 mg daily when necessary.  Ferrous sulfate 325 mg 3 times a day.  Lasix 20 mg when necessary fluid or edema--currently this is being held.  Norco 7.5-325 mg 1 or 2 tabs every 4 hours when necessary pain.  Humulin N insulin 15 units every morning with breakfast.  Humulin R sliding scale insulin.  Imdur 30 mg daily.  Lisinopril 20 mg daily--this is being held currently.  Ativan 0.5 mg twice a day.  Antivert 25 mg twice a day when necessary dizziness.  Glucophage ex RR 500 mg 1 tablet with breakfast and 1 tablet with supper.--- This is currently being held  Lopressor 50 mg twice a day.  Nitroglycerin when necessary.  Paxil 20 mg daily.  MiraLAX twice a day.  Zanaflex 4 mg every 6 hours when necessary muscle spasms  Social history-no  significant history of tobacco use alcohol or illicit drug use-I believe she lives with family who is quite supportive and they're in the room with her today   Family history  prostate cancer in a brother-brothers also with heart disease including MI.  Sister. significant for diabetes-MI-osteoporosis and hypertension.As well as early death  Family history significant  for  Review of systems.--Limited secondary patient being somewhat agitated  In general does not complain of any fever or chills.  Skin does not complain of rashes or itching.  Head ears eyes nose mouth and throat does have partial left eye blindness has prescription lenses does not complain of pain or visual changes from baseline.   Does not complaining of sore throat.  Respiratory does not complain of shortness breath or cough.  Cardiac no chest pain does have some mild lower extremity edema.  GI does not complain of abdominal discomfort    GU does not complain of  overt dysuria.--Urine culture is pending  Muscle skeletal is status post right knee replacement  Again has has some increased edema and warmth tenderness to area  Neurologic does not complain of dizziness headache school-type feelings does have a history of CVA with some slight speech impairment balance issues also appears to have a mouth drooped.--This is baseline with previous exams  Psych does have a history of anxiety and depression per family--apparently anxiety agitation has increased recently  Physical exam.  Temperature 97.6 pulse 73 respirations 26 blood pressure 153/63 previous blood pressures 135/58-138/71.  General this is a pleasant elderly resident in no distress but anxious,agitated- sitting comfortably in her wheelchair.  Her skin is warm and dry .  Eyes she does have partial left eye blindness does not appear to have gross visual deficits otherwise she has prescription lenses.  Oropharynx clear mucous membranes moist.  Chest is clear to auscultation there is no labored breathing.  Heart is regular rate and rhythm without murmur gallop or rub she has mild right lower extremity edema t or so on the right here is a positive pedal pulse  Hdoes have some edema of her right knee this is warm to touch surgical scar appears to be well-healed I do not really see any erythema or drainage here there is  some tenderness to palpation.  Abdomen soft obese soft nontender there are positive sounds.  Muscle skeletal she is sitting in a wheelchair upper extremity strength appears to relatively well-preserved Is able to move her lower extremities again is status post right knee replacement with changes as noted above.  Neurologic she does have slightly dysarthric speech with a mouth drooped otherwise could not really appreciate lateralizing findings of significance.  Psych she appears   agitated and anxious difficult to fully do a cognition exam secondary to agitation she is occasionally tearful appears to be agitated with family.--She is cooperative with exam  Labs.  09/16/2015.  Sodium 136 potassium 4.6 BUN 44 creatinine 1.99.  WBC 8.3 hemoglobin 10.2-platelets 184.    08/15/2015.  WBC 9.2 hemoglobin 9.3 platelets 152.  Sodium 137 potassium 4.5 BUN 33 creatinine 0.98  Assessment and plan.  1 history of right total knee replacement- Appears to have some increased edema today warm and tenderness although I do not really appreciate significant erythema-will order a venous Doppler to rule out DVT as well as an x-ray-also attempt to obtain an orthopedic consult as soon as possible  #2 altered mental status-he is more agitated and anxious than I saw previously apparently this is been  going on for several days and per family possibly worsening gradualy-Dr. Dellia Nims has order a urinalysis and culture so far this appears to be fairly benign appearing although results are not final-I do not see any neurologic changes today-will update lab work including a CBC with differential CMP TSH and ammonia level.  #3-renal insufficiency again creatinine had risen significantly up to 1.99 as of yesterday were baseline from the hospital per record review 0.87-1.41--again lisinopril Lasix and Glucophage are currently on hold will update again a metabolic panel  #4 History CHF she does have a preserved  ejection fraction per most recent echo per discharge report-suspect there is an element of diastolic dysfunction she had Lasix when necessary at this point appears stable clinically- Lasix currently on hold secondary to renal insufficiency-edema appears to be at baseline today but this will have to be watched.  #5 history of CVA again she is on anticoagulation with aspirin appears to have some efforts it's including speech visual changes and balance issues-at this point will monitor she appears to be at her baseline today although she is quite agitated.  #6 history of coronary artery disease again she does continue on aspirin she is also on Plavix and a statin at this point appears to be stable. She is on a beta blocker was on an ACE inhibitor--currently on hold secondary to renal insufficiency--she also has Imdur daily and nitroglycerin when necessary     #7-history depression she is on Paxil.  #8 history of anxiety she does have Ativan when necessary.  Update.  We have obtain results of the venous Doppler which is negative for DVT of the right lower extremity-x-ray also does not show any acute changes status post knee replacement.  Blood work at this point appears fairly unremarkable renal function has improved with a creatinine of 1.34 BUN of 32-I do note a potassium of 5.7 per chart review she has at times has had potassiums over 5 in the hospital but this appears to be intermittent-this was discussed with Dr. Dellia Nims via phone and we will update a stat BMP tomorrow to see where this is trending or if it is a true reading.  Her hemoglobin is 10.8 which appears stable white count is within normal range at 6.5.  Ammonia level was less than 9.  Liver function tests within normal limits except ALT of 11        [id number]-of note greater than 40 minutes spent assessing patient-reviewing her chart-discussing her status with her family at bedside  as well as with nursing-and coordinating  and formulating a plan of care for numerous diagnoses-of note greater than 50% of time spent coordinating plan of care .

## 2015-09-17 NOTE — Progress Notes (Signed)
[name], female   DOB: May 16, 1931, 81 y.o.   MRN: [id number]                PROGRESS NOTE  DATE:  09/16/2015            FACILITY: [facility]              LEVEL OF CARE:   SNF   Acute Visit            CHIEF COMPLAINT:  Increasing confusion.       HISTORY OF PRESENT ILLNESS:  This is a patient who came to [place] after a right total knee replacement.  She is essentially here for rehabilitation.    She is a type 2 diabetic, on insulin, with chronic renal failure.    Apparently, the staff noted increasing confusion almost a week ago.    A urine culture was done on 09/12/2015 that showed a completely normal urinalysis and the culture showing multiple species, suggesting recollection.    Apparently, this has been progressive over the weekend.  She was delusional, thinking there was somebody in her closet.  This persisted even though the nurse showed her that there was nobody in the closet.    CURRENT MEDICATIONS:  Medication list is reviewed.     Imdur 30 mg q.d.      Lasix 20 a day.    Lipitor 20 q.d.     Lisinopril 20 q.d.      Lopressor 50 b.i.d.      Meclizine 25 b.i.d. p.r.n.      Metformin 500 twice a day.     Norvasc 10 q.d.     Paxil 20 q.d.      Plavix 75 q.d.    Humulin N 25 U a.c. breakfast.     LABORATORY DATA:   Lab work was done today:    Sodium 136, potassium 4.6, CO2 of 23.  BUN 44, creatinine 1.99.  This is up from her baseline of 24 and 0.87, and 33 and 0.98, both from a month ago.    White count normal at 8.3, hemoglobin 10.2 (increased), platelet count normal.    REVIEW OF SYSTEMS:   Very difficult to follow from the patient.  Her speech is somewhat rambling.   HEENT:   No clear headache.     CHEST/RESPIRATORY:  No shortness of breath.   CARDIAC:  No chest pain.   GI:  She rubs her lower abdomen/pelvis, complaining of discomfort.  There is no clear nausea or vomiting.      GU:  No clear dysuria, although her  daughter who is present states that she has frequent urinary tract infections.     PSYCHIATRIC:  Mental status:  Daughter also states that she has a history of depression and she feels her mother is depressed currently.    PHYSICAL EXAMINATION:   VITAL SIGNS:     TEMPERATURE:  She is afebrile.    PULSE:  69 and regular.   RESPIRATIONS:  20 and unlabored.   BLOOD PRESSURE:  153/63.    02 SATURATIONS:  98% on room air.    CHEST/RESPIRATORY:  Clear air entry bilaterally.    CARDIOVASCULAR:   CARDIAC:  Heart sounds are normal.   GASTROINTESTINAL:   ABDOMEN:  Soft.  Bowel sounds are positive.  There is some right upper quadrant tenderness, but no guarding or rebound.   I note cholecystectomy scars.     GENITOURINARY:  BLADDER:  Not clearly distended.   There is no CVA tenderness.   CIRCULATION:   EDEMA/VARICOSITIES:  Extremities:  Minimal degrees of edema.    NEUROLOGICAL:   I cannot really see any lateralizing issues.   PSYCHIATRIC:   MENTAL STATUS:  Rambling, incoherent speech.     ASSESSMENT/PLAN:            Acute delirium.  The cause of this is not clear.  The lab work suggests acute renal failure, but not even the cause of this is clear.  She is complaining of lower abdominal pain.  I think it is reasonable to go ahead and do a renal ultrasound.  Repeat urine collection for C&S, although the urinalysis looked completely clear.  Any of these issues could cause confusion in an older person.  She does not appear to be dehydrated.    Acute renal failure.  See discussion above.    ?Recurrent UTIs.  Enough to repeat her culture.    Type 2 diabetes with nephropathy.   At baseline.  This seems to be relatively mild.   Her blood sugars are not under that bad control, currently between 125 and 160 at all times of the day.     CPT CODE: 60454

## 2015-09-18 ENCOUNTER — Other Ambulatory Visit (HOSPITAL_COMMUNITY): Payer: Medicare Other

## 2015-09-18 ENCOUNTER — Encounter (HOSPITAL_COMMUNITY)
Admission: RE | Admit: 2015-09-18 | Discharge: 2015-09-18 | Disposition: A | Discharge status: Home / Self Care | Payer: Medicare Other | Source: Skilled Nursing Facility | Attending: Internal Medicine | Admitting: Internal Medicine

## 2015-09-18 LAB — BASIC METABOLIC PANEL
Anion gap: 4 — ABNORMAL LOW (ref 5–15)
BUN: 31 mg/dL — ABNORMAL HIGH (ref 6–20)
CHLORIDE: 105 mmol/L (ref 101–111)
CO2: 27 mmol/L (ref 22–32)
CREATININE: 1.32 mg/dL — AB (ref 0.44–1.00)
Calcium: 9.8 mg/dL (ref 8.9–10.3)
GFR, EST AFRICAN AMERICAN: 43 mL/min — AB (ref >60)
GFR, EST NON AFRICAN AMERICAN: 37 mL/min — AB (ref >60)
Glucose, Bld: 173 mg/dL — ABNORMAL HIGH (ref 65–99)
Potassium: 4.4 mmol/L (ref 3.5–5.1)
SODIUM: 136 mmol/L (ref 135–145)

## 2015-09-19 ENCOUNTER — Encounter (HOSPITAL_COMMUNITY)
Admission: AD | Admit: 2015-09-19 | Discharge: 2015-09-19 | Disposition: A | Discharge status: Home / Self Care | Payer: Medicare Other | Source: Skilled Nursing Facility | Attending: Internal Medicine | Admitting: Internal Medicine

## 2015-09-19 LAB — BASIC METABOLIC PANEL
ANION GAP: 7 (ref 5–15)
BUN: 32 mg/dL — AB (ref 6–20)
CALCIUM: 9.9 mg/dL (ref 8.9–10.3)
CO2: 27 mmol/L (ref 22–32)
Chloride: 106 mmol/L (ref 101–111)
Creatinine, Ser: 1.28 mg/dL — ABNORMAL HIGH (ref 0.44–1.00)
GFR calc Af Amer: 44 mL/min — ABNORMAL LOW (ref >60)
GFR, EST NON AFRICAN AMERICAN: 38 mL/min — AB (ref >60)
GLUCOSE: 107 mg/dL — AB (ref 65–99)
POTASSIUM: 4.4 mmol/L (ref 3.5–5.1)
SODIUM: 140 mmol/L (ref 135–145)

## 2015-09-25 ENCOUNTER — Non-Acute Institutional Stay (SKILLED_NURSING_FACILITY): Payer: Medicare Other | Admitting: Internal Medicine

## 2015-09-25 ENCOUNTER — Encounter: Payer: Self-pay | Admitting: Internal Medicine

## 2015-09-25 DIAGNOSIS — I10 Essential (primary) hypertension: Secondary | ICD-10-CM | POA: Diagnosis not present

## 2015-09-25 DIAGNOSIS — E119 Type 2 diabetes mellitus without complications: Secondary | ICD-10-CM

## 2015-09-25 DIAGNOSIS — D638 Anemia in other chronic diseases classified elsewhere: Secondary | ICD-10-CM | POA: Diagnosis not present

## 2015-09-25 DIAGNOSIS — Z794 Long term (current) use of insulin: Secondary | ICD-10-CM | POA: Diagnosis not present

## 2015-09-25 DIAGNOSIS — Z96651 Presence of right artificial knee joint: Secondary | ICD-10-CM | POA: Diagnosis not present

## 2015-09-25 DIAGNOSIS — R4182 Altered mental status, unspecified: Secondary | ICD-10-CM

## 2015-09-25 NOTE — Progress Notes (Signed)
[name], female   DOB: [birth year], 81 y.o.   MRN: [id number]      This is a discharge note  Level care skilled.  Facility [facility].  Chief complaint-discharge note    History of present illness.  Patient is a pleasant 81 year old female with a history of severe right knee pain and functional disability secondary to arthritis.  Conservative measures pain management were not successful in controlling her pain and discomfort and improving her function significantly-and thus underwent a total knee replacement.  Apparently she tolerated the procedure well. Other issues include CHF with preserved ejection fraction history CVA with left eye blindness and chronic renal insufficiency.  Dr. Dellia Nims saw her previously  for a creatinine which had risen to 1.99 baseline appears to be around 1-he did make changes including holding her Lasix lisinopril and Glucophage  Creatinine had gotten closer to her baseline at 1.28 BUN of 32 on October 13 this appears to have stabilized I do not note that she has any increased edema from baseline.  Saw her recently for family concerns about altered mental status increased confusion and agitation-however this has resolved-there is some suspicion this was more of a social issue and difficulty dealing with her roommate now that she is in a private room apparently her symptoms essentially resolved   Patient will be going home with her family who is very supportive she will need continued PT and OT for strengthening she has done well but still has significant weakness is able to stand without assistance but again ambulating is a fall risk.  She is followed by orthopedics.    Previous medical history.  Status post right TKA secondary to end-stage also arthritis.  Anemia.  Hypertension.  CHF with preserved ejection fraction per echo 2012.  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  Iron deficiency anemia.  Carotid stenosis.  Renal insufficiency.  Vertigo when sugar gets low.  Coronary artery disease.  Myocardial infarction history.  History see note allergies.  Diabetes type 2.  Anxiety.  Depression.  GERD.  Mild dementia.  Surgical history.  I history of incision and drainage of furuncle.  Status post CABG 2006.  Total abdominal hysterectomy when she was 81 years old with a tumor removal.  Cholecystectomy.  Appendectomy with hysterectomy.  Bilateral cataract extraction 5 years ago.  Medications.  Norvasc 10 mg daily.  Aspirin 325 mg daily.  Lipitor 20 mg daily at bedtime.  Plavix 75 mg daily.  Multivitamin daily.  Colace 100 mg twice a day.   dry eyes when necessary.  Nexium 40 mg daily when necessary.  Ferrous sulfate 325 mg 3 times a day.  Lasix 20 mg when necessary fluid or edema--currently this is being held.  Norco 7.5-325 mg 1 or 2 tabs every 4 hours when necessary pain.  Humulin N insulin 15 units every morning with breakfast.  Humulin R sliding scale insulin.  Imdur 30 mg daily.  Lisinopril 20 mg daily--this is being held currently.  Ativan 0.5 mg twice a day.  Antivert 25 mg twice a day when necessary dizziness.  Glucophage ex RR 500 mg 1 tablet with breakfast and 1 tablet with supper.--- This is currently being held  Lopressor 50 mg twice a day.  Nitroglycerin when necessary.  Paxil 20 mg daily.  MiraLAX twice a day.  Zanaflex 4 mg every 6 hours when necessary muscle spasms  Social  history-no significant history of tobacco use alcohol or illicit drug use-I she lives with family who is quite supportive and they're in the room with her today   Family history  prostate cancer in a brother-brothers also with heart disease including MI.  Sister. significant for diabetes-MI-osteoporosis and hypertension.As well as early death  Family history significant  for  Review of systems.--  In general does not complain of any fever or chills.  Skin does not complain of rashes or itching.  Head ears eyes nose mouth and throat does have partial left eye blindness has prescription lenses does not complain of pain or visual changes from baseline.   Does not complaining of sore throat.  Respiratory does not complain of shortness breath or cough.  Cardiac no chest pain does have some mild lower extremity edema.  GI does not complain of abdominal discomfort    GU does not complain of  overt dysuria.--  Muscle skeletal is status post right knee replacement  She has progressed  with therapy  Neurologic does not complain of dizziness headache school-type feelings does have a history of CVA with some slight speech impairment balance issues also appears to have a mouth drooped.--This is baseline with previous exams  Psych does have a history of anxiety and depression per family--this appears significantly improved from my last exam  Physical exam. Temperature 98.0 pulse 56 respirations 20 blood pressure 130/60  General this is a pleasant elderly resident in no distress but anxious,agitated- sitting comfortably in her wheelchair.  Her skin is warm and dry .  Eyes she does have partial left eye blindness does not appear to have gross visual deficits otherwise she has prescription lenses.  Oropharynx clear mucous membranes moist.  Chest is clear to auscultation there is no labored breathing.  Heart is regular rate and rhythm slightly bradycardic without murmur gallop or rub she has mild right lower extremity edema t or so on the right here is a positive pedal pulse  surgical scar appears to be well-healed   Abdomen soft obese soft nontender there are positive sounds.  Muscle skeletal she is sitting in a wheelchair upper extremity strength appears to relatively well-preserved Is able to move her lower extremities again is status post right knee  replacement  She is able to stand without assistance but is quite weak with any attempt at ambulation without an assistance device.  Neurologic she does have slightly dysarthric speech with a mouth drooped otherwise could not really appreciate lateralizing findings of significance.  Psych  She is pleasant and cooperative more in tune with what I saw originally her agitation appears to be resolved  Labs  09/19/2015.  Sodium 140 potassium 4.4 BUN 32 creatinine 1.28.  09/17/2015.  ALT 11 otherwise liver function tests within normal limits.  TSH-1.707  .  09/16/2015.  Sodium 136 potassium 4.6 BUN 44 creatinine 1.99.  WBC 8.3 hemoglobin 10.2-platelets 184.    08/15/2015.  WBC 9.2 hemoglobin 9.3 platelets 152.  Sodium 137 potassium 4.5 BUN 33 creatinine 0.98  Assessment and plan.  1 history of right total knee replacement-she will need continued PT and OT-we did do a venous Doppler recently secondary some increased edema this was negative x-ray did not show any significant acute process  appears to be doing relatively well here better than when I saw her previously    #2 altered mental status- She now appears to be back at her baseline pleasant and appropriate  #3-renal insufficiency  Again creatinine has approached  near her baseline at 1.2 had been as high as 1.99 or Lasix is being held I do not see any increased edema from baseline #4 History CHF she does have a preserved ejection fraction per most recent echo per discharge report-suspect there is an element of diastolic dysfunction she had Lasix when necessary at this point appears stable clinically- Lasix currently on hold secondary to renal insufficiency-edema appears to be at baseline today but this will have to be watched.  #5 history of CVA again she is on anticoagulation with aspirin appears to have some efforts it's including speech visual changes and balance issues-at this point will monitor she appears to be  at her baseline today .  #6 history of coronary artery disease again she does continue on aspirin she is also on Plavix and a statin at this point appears to be stable. She is on a beta blocker was on an ACE inhibitor--currently on hold secondary to renal insufficiency--she also has Imdur daily and nitroglycerin when necessary     #7-history depression she is on Paxil.  #8 history of anxiety she does have Ativan when necessary. #9 Hypertension this appears stable she is on Lopressor as well as Norvasc-recent blood pressures 130/60-132/52. #10 Anemia with some suspicion of postop blood loss I suspect she is on iron as appears to be stable per most recent lab.  #11 history of diabetes type 2 she is on Humulin N 25 units subcutaneous a.m.-this has not really been an issue during her stay here     [id number] note greater than 30 minutes spent on this discharge summary-greater than 50% of time spent coordinating plan of care for numerous diagnoses          .

## 2015-09-26 ENCOUNTER — Ambulatory Visit: Payer: Medicare Other | Attending: Orthopedic Surgery | Admitting: Physical Therapy

## 2015-09-26 DIAGNOSIS — M25561 Pain in right knee: Secondary | ICD-10-CM | POA: Diagnosis not present

## 2015-09-26 DIAGNOSIS — M25661 Stiffness of right knee, not elsewhere classified: Secondary | ICD-10-CM | POA: Insufficient documentation

## 2015-09-26 NOTE — Therapy (Signed)
[facility] [place], [place], [address] Phone: [phone]   Fax:  [phone]  Physical Therapy Treatment  Patient Details  Name: [name] MRN: [id number] Date of Birth: 18-Oct-1931 Referring Provider: Paralee Cancel MD.  Encounter Date: 09/26/2015      PT End of Session - 09/26/15 1411    Visit Number 1   Number of Visits 12   Date for PT Re-Evaluation 11/14/15   PT Start Time 0150   PT Stop Time 0239   PT Time Calculation (min) 49 min   Activity Tolerance Patient limited by fatigue;Patient limited by pain   Behavior During Therapy [facility] for tasks assessed/performed      Past Medical History  Diagnosis Date  . Anemia   . Hypertension   . Iron deficiency anemia 05/27/2011  . CHF (congestive heart failure) ([place])     EF preserved Echo 2012  . 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 Carotid stenosis   . Renal insufficiency   . Furuncle of back, except buttock   . Vertigo     "when sugar gets low"  . CAD (coronary artery disease)   . Chronic kidney infection   . Arthritis     back, knees, and hips  . Myocardial infarction ([place])   . Balance problems   . Speech problem     from stroke  . Vision loss     left eye-"half blind"  . Hearing loss   . Shortness of breath dyspnea     with activity  . H/O seasonal allergies   . Pneumonia     hx of  . Asthma     with allergies  . Swallowing difficulty   . Diabetes mellitus     type 2  . Anxiety   . Depression   . GERD (gastroesophageal reflux disease)   . Dementia     "a little"    Past Surgical History  Procedure Laterality Date  . I & d of furuncle  April 2013  . Coronary artery bypass graft  2006  . Coronary angioplasty      prior to 2006 5 stents  . Total abdominal hysterectomy  ~81 years old    complete, with tumor removal  . Cholecystectomy    .  Appendectomy      with hysterectomy  . Cataract extraction Bilateral 5 years ago  . Total knee arthroplasty Right 08/13/2015    Procedure: RIGHT  TOTAL KNEE ARTHROPLASTY;  Surgeon: Paralee Cancel, MD;  Location: [facility];  Service: Orthopedics;  Laterality: Right;    There were no vitals filed for this visit.  Visit Diagnosis:  Right knee pain - Plan: PT plan of care cert/re-cert  Knee stiffness, right - Plan: PT plan of care cert/re-cert      Subjective Assessment - 09/26/15 1413    Subjective I'm glad to be coing here.   Limitations Walking   Patient Stated Goals Get out of pain.   Pain Score 4    Pain Location Knee   Pain Orientation Right   Pain Descriptors / Indicators Sore   Pain Type Surgical pain   Pain Frequency Intermittent            [facility] PT Assessment - 09/26/15 0001    Assessment   Medical Diagnosis Right total knee replacement.   Referring Provider Paralee Cancel MD.   Onset  Date/Surgical Date --  08/13/15 (surgery date).   Precautions   Precaution Comments No ultrasound.  Be with patient at all times for safety.   Restrictions   Weight Bearing Restrictions No   Balance Screen   Has the patient fallen in the past 6 months Yes   How many times? --  1   Has the patient had a decrease in activity level because of a fear of falling?  No   Is the patient reluctant to leave their home because of a fear of falling?  No   Home Environment   Living Environment Private residence   Prior Function   Level of Independence Needs assistance with ADLs   Observation/Other Assessments-Edema    Edema Circumferential   Circumferential Edema   Circumferential - Right 4 cms > right than left.   ROM / Strength   AROM / PROM / Strength AROM;Strength   AROM   Overall AROM Comments -20 degrees to -15 degrees passive to active right knee flexion= 120 degrees.   Strength   Overall Strength Comments Right hip strength= 4-/5 and right knee strength= 4/5.   Palpation   Palpation  comment Diffuse anterior right knee pain.   Ambulation/Gait   Gait Comments CGA with a FWW.                     [facility] Adult PT Treatment/Exercise - 09/26/15 0001    Modalities   Modalities Electrical Stimulation;Vasopneumatic   Electrical Stimulation   Electrical Stimulation Location right knee.   Electrical Stimulation Action IFC @ 1-10 HZ X 15 minutes   Electrical Stimulation Goals Edema;Pain   Vasopneumatic   Number Minutes Vasopneumatic  15 minutes   Vasopnuematic Location  --  Right knee.   Vasopneumatic Pressure Medium                  PT Short Term Goals - 09/26/15 1420    PT SHORT TERM GOAL #1   Title Ind with an initial HEP.   Time 3   Period Weeks   Status New   PT SHORT TERM GOAL #2   Title Full active right knee extension.   Time 3   Period Weeks   Status New           PT Long Term Goals - 09/26/15 1420    PT LONG TERM GOAL #1   Title Ind with an advanced HEP.   Time 6   Period Weeks   Status New   PT LONG TERM GOAL #2   Title Right hip and knee strength= 5/5.   Time 6   Period Weeks   Status New   PT LONG TERM GOAL #3   Title Perform ADL's with pain not > 3/10.               Plan - 09/26/15 1418    Clinical Impression Statement The patient underwent a right total knee replacement on 08/13/15.  She had a stay in a SNF.  Her pain-level today is a 4/10.     Pt will benefit from skilled therapeutic intervention in order to improve on the following deficits Pain;Decreased activity tolerance;Increased edema;Decreased strength;Decreased range of motion   Rehab Potential Good   PT Frequency 3x / week   PT Duration 4 weeks   PT Treatment/Interventions ADLs/Self Care Home Management;Electrical Stimulation;Cryotherapy;Therapeutic exercise;Therapeutic activities;Patient/family education;Manual techniques;Vasopneumatic Device   PT Next Visit Plan Total knee replacement protocol.  No ultrasound.  G-Codes - 09/26/15  1422    Functional Assessment Tool Used FOTO.   Functional Limitation Mobility: Walking and moving around   Mobility: Walking and Moving Around Current Status [phone]) At least 60 percent but less than 80 percent impaired, limited or restricted   Mobility: Walking and Moving Around Goal Status [phone]) At least 20 percent but less than 40 percent impaired, limited or restricted      Problem List Patient Active Problem List   Diagnosis Date Noted  . Altered mental status 09/17/2015  . S/P right TKA 08/13/2015  . S/P knee replacement 08/13/2015  . Elevated troponin 03/06/2015  . Fever   . History of diabetes mellitus   . Left buttock pain   . Hyperlipidemia 01/11/2015  . GAD (generalized anxiety disorder) 01/11/2015  . Depression 01/11/2015  . GERD (gastroesophageal reflux disease) 01/11/2015  . Osteopenia 07/13/2014  . Obesity (BMI 30-39.9) 06/19/2014  . Carotid stenosis   . Anemia of chronic disease 12/25/2011  . Iron deficiency anemia 05/27/2011  . ACUTE DIASTOLIC HEART FAILURE 123456  . Type 2 diabetes mellitus with insulin therapy ([place]) 10/08/2009  . DYSLIPIDEMIA 10/08/2009  . Essential hypertension 10/08/2009  . Coronary atherosclerosis 10/08/2009  . CAROTID STENOSIS 10/08/2009    Roger Fasnacht, [place] MPT 09/26/2015, 3:10 PM  [facility] [place], [place], [address] Phone: [phone]   Fax:  [phone]  Name: [name] MRN: [id number] Date of Birth: Oct 26, 1931

## 2015-09-30 ENCOUNTER — Ambulatory Visit: Payer: Medicare Other | Admitting: Physical Therapy

## 2015-09-30 ENCOUNTER — Encounter: Payer: Self-pay | Admitting: Physical Therapy

## 2015-09-30 DIAGNOSIS — M25561 Pain in right knee: Secondary | ICD-10-CM | POA: Diagnosis not present

## 2015-09-30 DIAGNOSIS — E109 Type 1 diabetes mellitus without complications: Secondary | ICD-10-CM | POA: Diagnosis not present

## 2015-09-30 DIAGNOSIS — L84 Corns and callosities: Secondary | ICD-10-CM | POA: Diagnosis not present

## 2015-09-30 DIAGNOSIS — M25661 Stiffness of right knee, not elsewhere classified: Secondary | ICD-10-CM | POA: Diagnosis not present

## 2015-09-30 NOTE — Therapy (Signed)
[facility] [place], [place], [address] Phone: [phone]   Fax:  [phone]  Physical Therapy Treatment  Patient Details  Name: [name] MRN: [id number] Date of Birth: [birth year] Referring Provider: Paralee Cancel MD.  Encounter Date: 09/30/2015      PT End of Session - 09/30/15 1647    Visit Number 2   Number of Visits 12   Date for PT Re-Evaluation 11/14/15   PT Start Time 1645   PT Stop Time 1740   PT Time Calculation (min) 55 min   Activity Tolerance Patient tolerated treatment well;Patient limited by fatigue   Behavior During Therapy [facility] for tasks assessed/performed      Past Medical History  Diagnosis Date  . Anemia   . Hypertension   . Iron deficiency anemia 05/27/2011  . CHF (congestive heart failure) ([place])     EF preserved Echo 2012  . 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 Carotid stenosis   . Renal insufficiency   . Furuncle of back, except buttock   . Vertigo     "when sugar gets low"  . CAD (coronary artery disease)   . Chronic kidney infection   . Arthritis     back, knees, and hips  . Myocardial infarction ([place])   . Balance problems   . Speech problem     from stroke  . Vision loss     left eye-"half blind"  . Hearing loss   . Shortness of breath dyspnea     with activity  . H/O seasonal allergies   . Pneumonia     hx of  . Asthma     with allergies  . Swallowing difficulty   . Diabetes mellitus     type 2  . Anxiety   . Depression   . GERD (gastroesophageal reflux disease)   . Dementia     "a little"    Past Surgical History  Procedure Laterality Date  . I & d of furuncle  April 2013  . Coronary artery bypass graft  2006  . Coronary angioplasty      prior to 2006 5 stents  . Total abdominal hysterectomy  ~81 years old    complete, with tumor removal  . Cholecystectomy    .  Appendectomy      with hysterectomy  . Cataract extraction Bilateral 5 years ago  . Total knee arthroplasty Right 08/13/2015    Procedure: RIGHT  TOTAL KNEE ARTHROPLASTY;  Surgeon: Paralee Cancel, MD;  Location: [facility];  Service: Orthopedics;  Laterality: Right;    There were no vitals filed for this visit.  Visit Diagnosis:  Right knee pain  Knee stiffness, right      Subjective Assessment - 09/30/15 1645    Subjective States that her knee feels better than it did the other day when she was here. Reports R knee not feeling as achey.   Limitations Walking   Patient Stated Goals Get out of pain.   Currently in Pain? Yes   Pain Score 2    Pain Location Knee   Pain Orientation Right   Pain Descriptors / Indicators Sore   Pain Type Surgical pain            [facility] PT Assessment - 09/30/15 0001    Assessment   Medical Diagnosis Right total knee replacement.   Onset Date/Surgical Date 08/13/15  Precautions   Precaution Comments No ultrasound.  Be with patient at all times for safety.                     [facility] Adult PT Treatment/Exercise - 09/30/15 0001    Exercises   Exercises Knee/Hip   Knee/Hip Exercises: Aerobic   Nustep L4 x15 min   Knee/Hip Exercises: Standing   Forward Step Up Right;1 set;15 reps;Hand Hold: 2;Step Height: 6"  Reported fatigue   Rocker Board 3 minutes  Began reporting fatigue in RLE   Knee/Hip Exercises: Seated   Long Arc Quad Strengthening;Right;1 set;15 reps;Weights  Reported fatigue   Long Arc Quad Weight 3 lbs.   Modalities   Modalities Psychologist, sport and exercise Action IFC   Electrical Stimulation Parameters 1-10 Hz x15 min   Electrical Stimulation Goals Edema;Pain   Vasopneumatic   Number Minutes Vasopneumatic  15 minutes   Vasopnuematic Location  Knee   Vasopneumatic Pressure Medium   Vasopneumatic Temperature  34    Manual Therapy   Manual Therapy Passive ROM;Soft tissue mobilization   Soft tissue mobilization R patellar mobilizations in L/R, sup/inf; R incision mobilizations throughout incision to decrease adhesions   Passive ROM PROM of R knee into flex/ext with gentle holds at end range                  PT Short Term Goals - 09/26/15 1420    PT SHORT TERM GOAL #1   Title Ind with an initial HEP.   Time 3   Period Weeks   Status New   PT SHORT TERM GOAL #2   Title Full active right knee extension.   Time 3   Period Weeks   Status New           PT Long Term Goals - 09/26/15 1420    PT LONG TERM GOAL #1   Title Ind with an advanced HEP.   Time 6   Period Weeks   Status New   PT LONG TERM GOAL #2   Title Right hip and knee strength= 5/5.   Time 6   Period Weeks   Status New   PT LONG TERM GOAL #3   Title Perform ADL's with pain not > 3/10.               Plan - 09/30/15 1728    Clinical Impression Statement Patient tolerated today's treatment well with no complaints of increased pain only reporting fatigue with standing exercises. Completed all exercises well with moderate multimodal cueing for correct exercise technique. Demonstrates minimal R knee incision bunching mostly in the superior incision. Demonstrates normal R patellar mobility in L/R and slightly diminished mobility in sup/inf direction. Patient experienced tenderness during R incison mobilization.R knee PROM noted as smooth wthout complaint of pain from patient. AROM of the R knee measured as 2-121 deg in supine. Continues to ambulate with FWW and ambulates with L toe out which patient reports that LLE is weak. Patient was encouraged to be careful with ambulation. Normal modaliities response noted following removal of the modalities. Denied R knee pain following today's treatment.   Pt will benefit from skilled therapeutic intervention in order to improve on the following deficits Pain;Decreased activity  tolerance;Increased edema;Decreased strength;Decreased range of motion   Rehab Potential Good   PT Frequency 3x / week   PT Duration 4 weeks   PT  Treatment/Interventions ADLs/Self Care Home Management;Electrical Stimulation;Cryotherapy;Therapeutic exercise;Therapeutic activities;Patient/family education;Manual techniques;Vasopneumatic Device   PT Next Visit Plan Total knee replacement protocol.  No ultrasound.   Consulted and Agree with Plan of Care Patient        Problem List Patient Active Problem List   Diagnosis Date Noted  . Altered mental status 09/17/2015  . S/P right TKA 08/13/2015  . S/P knee replacement 08/13/2015  . Elevated troponin 03/06/2015  . Fever   . History of diabetes mellitus   . Left buttock pain   . Hyperlipidemia 01/11/2015  . GAD (generalized anxiety disorder) 01/11/2015  . Depression 01/11/2015  . GERD (gastroesophageal reflux disease) 01/11/2015  . Osteopenia 07/13/2014  . Obesity (BMI 30-39.9) 06/19/2014  . Carotid stenosis   . Anemia of chronic disease 12/25/2011  . Iron deficiency anemia 05/27/2011  . ACUTE DIASTOLIC HEART FAILURE 123456  . Type 2 diabetes mellitus with insulin therapy ([place]) 10/08/2009  . DYSLIPIDEMIA 10/08/2009  . Essential hypertension 10/08/2009  . Coronary atherosclerosis 10/08/2009  . CAROTID STENOSIS 10/08/2009    Wynelle Fanny, PTA 09/30/2015, 5:56 PM  [facility] [address] [place], [place], [address] Phone: [phone]   Fax:  [phone]  Name: [name] MRN: [id number] Date of Birth: August 13, 1931

## 2015-10-03 ENCOUNTER — Other Ambulatory Visit: Payer: Self-pay | Admitting: Family Medicine

## 2015-10-04 ENCOUNTER — Encounter: Payer: Self-pay | Admitting: Physical Therapy

## 2015-10-04 ENCOUNTER — Ambulatory Visit: Payer: Medicare Other | Admitting: Physical Therapy

## 2015-10-04 DIAGNOSIS — M25661 Stiffness of right knee, not elsewhere classified: Secondary | ICD-10-CM | POA: Diagnosis not present

## 2015-10-04 DIAGNOSIS — M25561 Pain in right knee: Secondary | ICD-10-CM | POA: Diagnosis not present

## 2015-10-04 NOTE — Therapy (Signed)
[facility] [place], [place], [address] Phone: [phone]   Fax:  [phone]  Physical Therapy Treatment  Patient Details  Name: [name] MRN: [id number] Date of Birth: [birth year] Referring Provider: Paralee Cancel MD.  Encounter Date: 10/04/2015      PT End of Session - 10/04/15 0950    Visit Number 3   Number of Visits 12   Date for PT Re-Evaluation 11/14/15   PT Start Time 0948   PT Stop Time 1050   PT Time Calculation (min) 62 min   Activity Tolerance Patient tolerated treatment well;Patient limited by fatigue   Behavior During Therapy [facility] for tasks assessed/performed      Past Medical History  Diagnosis Date  . Anemia   . Hypertension   . Iron deficiency anemia 05/27/2011  . CHF (congestive heart failure) ([place])     EF preserved Echo 2012  . 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 Carotid stenosis   . Renal insufficiency   . Furuncle of back, except buttock   . Vertigo     "when sugar gets low"  . CAD (coronary artery disease)   . Chronic kidney infection   . Arthritis     back, knees, and hips  . Myocardial infarction ([place])   . Balance problems   . Speech problem     from stroke  . Vision loss     left eye-"half blind"  . Hearing loss   . Shortness of breath dyspnea     with activity  . H/O seasonal allergies   . Pneumonia     hx of  . Asthma     with allergies  . Swallowing difficulty   . Diabetes mellitus     type 2  . Anxiety   . Depression   . GERD (gastroesophageal reflux disease)   . Dementia     "a little"    Past Surgical History  Procedure Laterality Date  . I & d of furuncle  April 2013  . Coronary artery bypass graft  2006  . Coronary angioplasty      prior to 2006 5 stents  . Total abdominal hysterectomy  ~81 years old    complete, with tumor removal  . Cholecystectomy    .  Appendectomy      with hysterectomy  . Cataract extraction Bilateral 81 years ago  . Total knee arthroplasty Right 79/05/2015    Procedure: RIGHT  TOTAL KNEE ARTHROPLASTY;  Surgeon: Paralee Cancel, MD;  Location: [facility];  Service: Orthopedics;  Laterality: Right;    There were no vitals filed for this visit.  Visit Diagnosis:  Right knee pain  Knee stiffness, right      Subjective Assessment - 10/04/15 0950    Subjective States that she has had a little cramping in R thigh and that her R knee is swollen some today.   Limitations Walking   Patient Stated Goals Get out of pain.   Currently in Pain? No/denies            [facility] PT Assessment - 10/04/15 0001    Assessment   Medical Diagnosis Right total knee replacement.   Onset Date/Surgical Date 08/13/15                     [facility] Adult PT Treatment/Exercise - 10/04/15 0001    Knee/Hip Exercises:  Aerobic   Nustep L4 x21 min   Knee/Hip Exercises: Standing   Forward Step Up Right;2 sets;10 reps;Hand Hold: 2;Step Height: 6"   Rocker Board 3 minutes   Knee/Hip Exercises: Seated   Long Arc Quad Strengthening;Right;Weights;2 sets;10 reps   Long Arc Quad Weight 4 lbs.   Modalities   Modalities Psychologist, sport and exercise Action IFC   Electrical Stimulation Parameters 1-10 Hz x15 min   Electrical Stimulation Goals Edema;Pain   Vasopneumatic   Number Minutes Vasopneumatic  15 minutes   Vasopnuematic Location  Knee   Vasopneumatic Pressure Medium   Vasopneumatic Temperature  34   Manual Therapy   Manual Therapy Passive ROM   Passive ROM PROM of R knee into flex/ext with gentle holds at end range                  PT Short Term Goals - 09/26/15 1420    PT SHORT TERM GOAL #1   Title Ind with an initial HEP.   Time 3   Period Weeks   Status New   PT SHORT TERM GOAL #2   Title Full active right knee  extension.   Time 3   Period Weeks   Status New           PT Long Term Goals - 09/26/15 1420    PT LONG TERM GOAL #1   Title Ind with an advanced HEP.   Time 6   Period Weeks   Status New   PT LONG TERM GOAL #2   Title Right hip and knee strength= 5/5.   Time 6   Period Weeks   Status New   PT LONG TERM GOAL #3   Title Perform ADL's with pain not > 3/10.               Plan - 10/04/15 1036    Clinical Impression Statement Patient tolerated today's treatment well and had no complaints of increased pain only reporting fatigue again with forward step ups and LAQs. Completed all exercises with moderate multimodal cueing for correct exercise techniqnue. Required increased cueing with forward step for sequencing. PROM of R knee observed as smooth and patient had no complaints of pain during PROM. Normal modalities response noted following removal of the modalities. Continues to ambulate with FWW in therapy clinic.   Pt will benefit from skilled therapeutic intervention in order to improve on the following deficits Pain;Decreased activity tolerance;Increased edema;Decreased strength;Decreased range of motion   Rehab Potential Good   PT Frequency 3x / week   PT Duration 4 weeks   PT Treatment/Interventions ADLs/Self Care Home Management;Electrical Stimulation;Cryotherapy;Therapeutic exercise;Therapeutic activities;Patient/family education;Manual techniques;Vasopneumatic Device   PT Next Visit Plan Total knee replacement protocol.  No ultrasound.   Consulted and Agree with Plan of Care Patient        Problem List Patient Active Problem List   Diagnosis Date Noted  . Altered mental status 09/17/2015  . S/P right TKA 08/13/2015  . S/P knee replacement 08/13/2015  . Elevated troponin 03/06/2015  . Fever   . History of diabetes mellitus   . Left buttock pain   . Hyperlipidemia 01/11/2015  . GAD (generalized anxiety disorder) 01/11/2015  . Depression 01/11/2015  . GERD  (gastroesophageal reflux disease) 01/11/2015  . Osteopenia 07/13/2014  . Obesity (BMI 30-39.9) 06/19/2014  . Carotid stenosis   . Anemia of chronic disease 12/25/2011  . Iron  deficiency anemia 05/27/2011  . ACUTE DIASTOLIC HEART FAILURE 123456  . Type 2 diabetes mellitus with insulin therapy ([place]) 10/08/2009  . DYSLIPIDEMIA 10/08/2009  . Essential hypertension 10/08/2009  . Coronary atherosclerosis 10/08/2009  . CAROTID STENOSIS 10/08/2009    Wynelle Fanny, PTA 10/04/2015, 11:14 AM  [facility] [address] [place], [place], [address] Phone: [phone]   Fax:  [phone]  Name: [name] MRN: [id number] Date of Birth: [birth year]

## 2015-10-07 ENCOUNTER — Other Ambulatory Visit: Payer: Self-pay | Admitting: *Deleted

## 2015-10-07 ENCOUNTER — Ambulatory Visit: Payer: Medicare Other | Admitting: Physical Therapy

## 2015-10-07 DIAGNOSIS — M25661 Stiffness of right knee, not elsewhere classified: Secondary | ICD-10-CM | POA: Diagnosis not present

## 2015-10-07 DIAGNOSIS — M25561 Pain in right knee: Secondary | ICD-10-CM

## 2015-10-07 NOTE — Therapy (Signed)
[facility] [place], [place], [address] Phone: [phone]   Fax:  [phone]  Physical Therapy Treatment  Patient Details  Name: [name] MRN: [id number] Date of Birth: June 30, 1931 Referring Provider: Paralee Cancel MD.  Encounter Date: 10/07/2015      PT End of Session - 10/07/15 1024    Visit Number 4   Number of Visits 12   Date for PT Re-Evaluation 11/14/15   PT Start Time 1022   PT Stop Time 1125   PT Time Calculation (min) 63 min   Activity Tolerance Patient tolerated treatment well   Behavior During Therapy [facility] for tasks assessed/performed      Past Medical History  Diagnosis Date  . Anemia   . Hypertension   . Iron deficiency anemia 05/27/2011  . CHF (congestive heart failure) ([place])     EF preserved Echo 2012  . 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 Carotid stenosis   . Renal insufficiency   . Furuncle of back, except buttock   . Vertigo     "when sugar gets low"  . CAD (coronary artery disease)   . Chronic kidney infection   . Arthritis     back, knees, and hips  . Myocardial infarction ([place])   . Balance problems   . Speech problem     from stroke  . Vision loss     left eye-"half blind"  . Hearing loss   . Shortness of breath dyspnea     with activity  . H/O seasonal allergies   . Pneumonia     hx of  . Asthma     with allergies  . Swallowing difficulty   . Diabetes mellitus     type 2  . Anxiety   . Depression   . GERD (gastroesophageal reflux disease)   . Dementia     "a little"    Past Surgical History  Procedure Laterality Date  . I & d of furuncle  April 2013  . Coronary artery bypass graft  2006  . Coronary angioplasty      prior to 2006 5 stents  . Total abdominal hysterectomy  ~81 years old    complete, with tumor removal  . Cholecystectomy    . Appendectomy      with  hysterectomy  . Cataract extraction Bilateral 5 years ago  . Total knee arthroplasty Right 08/13/2015    Procedure: RIGHT  TOTAL KNEE ARTHROPLASTY;  Surgeon: Paralee Cancel, MD;  Location[facility];  Service: Orthopedics;  Laterality: Right;    There were no vitals filed for this visit.  Visit Diagnosis:  Knee stiffness, right  Right knee pain      Subjective Assessment - 10/07/15 1025    Subjective Patient states she continues to have intermittent cramping in R hip in the morning.   Patient Stated Goals Get out of pain.   Currently in Pain? Yes   Pain Score 3    Pain Location Knee   Pain Orientation Right   Pain Descriptors / Indicators Sore   Pain Type Surgical pain            [facility] PT Assessment - 10/07/15 0001    Assessment   Medical Diagnosis Right total knee replacement.   Onset Date/Surgical Date 08/13/15   ROM / Strength   AROM / PROM / Strength AROM;Strength   AROM  Overall AROM Comments ext -6/-3 degrees   Strength   Overall Strength Comments R quad lag                     [facility] Adult PT Treatment/Exercise - 10/07/15 0001    Knee/Hip Exercises: Aerobic   Stationary Bike L1 x 4   Nustep L4 x10 min adjusting seat forward for ROM   Knee/Hip Exercises: Supine   Quad Sets 10 reps;Strengthening;Right   Short Arc Quad Sets Strengthening;Right;10 reps;3 sets   Bridges with Clamshell Strengthening;Both;10 reps;2 sets  Regular bridge   Straight Leg Raises Limitations unable to perform without quad lag   Patellar Mobs sup/inf   Modalities   Modalities Designer, multimedia Location R knee    Electrical Stimulation Action IFC   Electrical Stimulation Parameters 80-150 HZ to tolerance x 15 min   Electrical Stimulation Goals Edema;Pain   Vasopneumatic   Number Minutes Vasopneumatic  15 minutes   Vasopnuematic Location  Knee   Vasopneumatic Pressure Medium   Vasopneumatic Temperature  34    Manual Therapy   Manual Therapy Passive ROM   Passive ROM passive stretch R knee extension                PT Education - 10/07/15 1117    Education provided Yes   Education Details hep   Person(s) Educated Patient   Methods Explanation;Demonstration;Handout   Comprehension Verbalized understanding;Returned demonstration          PT Short Term Goals - 10/07/15 1552    PT SHORT TERM GOAL #1   Title Ind with an initial HEP.   Time 3   Period Weeks   Status On-going   PT SHORT TERM GOAL #2   Title Full active right knee extension.   Time 3   Period Weeks   Status On-going           PT Long Term Goals - 09/26/15 1420    PT LONG TERM GOAL #1   Title Ind with an advanced HEP.   Time 6   Period Weeks   Status New   PT LONG TERM GOAL #2   Title Right hip and knee strength= 5/5.   Time 6   Period Weeks   Status New   PT LONG TERM GOAL #3   Title Perform ADL's with pain not > 3/10.               Plan - 10/07/15 1124    Clinical Impression Statement Patient is progressing with knee extension but still demos quad lag with SLR. Issued QS and SAQ to patient and daugher   Pt will benefit from skilled therapeutic intervention in order to improve on the following deficits Pain;Decreased activity tolerance;Increased edema;Decreased strength;Decreased range of motion   Rehab Potential Good   PT Frequency 3x / week   PT Duration 4 weeks   PT Treatment/Interventions ADLs/Self Care Home Management;Electrical Stimulation;Cryotherapy;Therapeutic exercise;Therapeutic activities;Patient/family education;Manual techniques;Vasopneumatic Device   PT Next Visit Plan Work on [employer]; SLR, Total knee replacement protocol.  No ultrasound.   PT Home Exercise Plan QS, SAQ, ext stretch   Consulted and Agree with Plan of Care Patient        Problem List Patient Active Problem List   Diagnosis Date Noted  . Altered mental status 09/17/2015  . S/P right TKA 08/13/2015  .  S/P knee replacement 08/13/2015  . Elevated troponin 03/06/2015  . Fever   .  History of diabetes mellitus   . Left buttock pain   . Hyperlipidemia 01/11/2015  . GAD (generalized anxiety disorder) 01/11/2015  . Depression 01/11/2015  . GERD (gastroesophageal reflux disease) 01/11/2015  . Osteopenia 07/13/2014  . Obesity (BMI 30-39.9) 06/19/2014  . Carotid stenosis   . Anemia of chronic disease 12/25/2011  . Iron deficiency anemia 05/27/2011  . ACUTE DIASTOLIC HEART FAILURE 123456  . Type 2 diabetes mellitus with insulin therapy ([place]) 10/08/2009  . DYSLIPIDEMIA 10/08/2009  . Essential hypertension 10/08/2009  . Coronary atherosclerosis 10/08/2009  . CAROTID STENOSIS 10/08/2009    Madelyn Flavors PT  10/07/2015, 3:56 PM  [facility] [address] [place], [place], [address] Phone: [phone]   Fax:  [phone]  Name: LONNISHA ALOISE MRN: [id number] Date of Birth: 1930-12-22

## 2015-10-07 NOTE — Telephone Encounter (Signed)
Last filled 09/04/15, last seen 07/23/15. Call in at [facility]. Ok to wait till 10/08/15

## 2015-10-08 ENCOUNTER — Encounter: Payer: Medicare Other | Admitting: Physical Therapy

## 2015-10-08 DIAGNOSIS — E1142 Type 2 diabetes mellitus with diabetic polyneuropathy: Secondary | ICD-10-CM | POA: Diagnosis not present

## 2015-10-08 DIAGNOSIS — L84 Corns and callosities: Secondary | ICD-10-CM | POA: Diagnosis not present

## 2015-10-08 DIAGNOSIS — B351 Tinea unguium: Secondary | ICD-10-CM | POA: Diagnosis not present

## 2015-10-08 MED ORDER — LORAZEPAM 0.5 MG PO TABS: ORAL_TABLET | ORAL | Status: DC

## 2015-10-08 NOTE — Telephone Encounter (Signed)
rx called to pharmacy 

## 2015-10-11 ENCOUNTER — Ambulatory Visit: Payer: Medicare Other | Attending: Orthopedic Surgery | Admitting: Physical Therapy

## 2015-10-11 ENCOUNTER — Encounter: Payer: Self-pay | Admitting: Physical Therapy

## 2015-10-11 DIAGNOSIS — M25661 Stiffness of right knee, not elsewhere classified: Secondary | ICD-10-CM | POA: Diagnosis not present

## 2015-10-11 DIAGNOSIS — M25561 Pain in right knee: Secondary | ICD-10-CM | POA: Diagnosis not present

## 2015-10-11 NOTE — Therapy (Signed)
[facility] [place], [place], [address] Phone: [phone]   Fax:  [phone]  Physical Therapy Treatment  Patient Details  Name: [name] MRN: [id number] Date of Birth: [birth year] Referring Provider: Paralee Cancel MD.  Encounter Date: 10/11/2015      PT End of Session - 10/11/15 0937    Visit Number 5   Number of Visits 12   Date for PT Re-Evaluation 11/14/15   PT Start Time 0936   PT Stop Time 1035   PT Time Calculation (min) 59 min   Activity Tolerance Patient tolerated treatment well;Patient limited by fatigue   Behavior During Therapy [facility] for tasks assessed/performed      Past Medical History  Diagnosis Date  . Anemia   . Hypertension   . Iron deficiency anemia 05/27/2011  . CHF (congestive heart failure) ([place])     EF preserved Echo 2012  . 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 Carotid stenosis   . Renal insufficiency   . Furuncle of back, except buttock   . Vertigo     "when sugar gets low"  . CAD (coronary artery disease)   . Chronic kidney infection   . Arthritis     back, knees, and hips  . Myocardial infarction ([place])   . Balance problems   . Speech problem     from stroke  . Vision loss     left eye-"half blind"  . Hearing loss   . Shortness of breath dyspnea     with activity  . H/O seasonal allergies   . Pneumonia     hx of  . Asthma     with allergies  . Swallowing difficulty   . Diabetes mellitus     type 2  . Anxiety   . Depression   . GERD (gastroesophageal reflux disease)   . Dementia     "a little"    Past Surgical History  Procedure Laterality Date  . I & d of furuncle  April 2013  . Coronary artery bypass graft  2006  . Coronary angioplasty      prior to 2006 5 stents  . Total abdominal hysterectomy  ~81 years old    complete, with tumor removal  . Cholecystectomy    .  Appendectomy      with hysterectomy  . Cataract extraction Bilateral 5 years ago  . Total knee arthroplasty Right 08/13/2015    Procedure: RIGHT  TOTAL KNEE ARTHROPLASTY;  Surgeon: Paralee Cancel, MD;  Location: [facility];  Service: Orthopedics;  Laterality: Right;    There were no vitals filed for this visit.  Visit Diagnosis:  Right knee pain  Knee stiffness, right      Subjective Assessment - 10/11/15 0937    Subjective Reports some lateral R knee soreness today.   Limitations Walking   Patient Stated Goals Get out of pain.   Currently in Pain? Yes   Pain Score 2    Pain Location Leg   Pain Orientation Right;Lateral   Pain Descriptors / Indicators Sore   Pain Type Surgical pain            [facility] PT Assessment - 10/11/15 0001    Assessment   Medical Diagnosis Right total knee replacement.   Onset Date/Surgical Date 08/13/15   Precautions   Precaution Comments No ultrasound.  Be with patient at all times  for safety.                     [place] Adult PT Treatment/Exercise - 10/11/15 0001    Knee/Hip Exercises: Aerobic   Stationary Bike L1 x6 min   Nustep L5 x10 min   Knee/Hip Exercises: Standing   Lateral Step Up Right;1 set;15 reps;Hand Hold: 2;Step Height: 6"   Forward Step Up Right;2 sets;10 reps;Hand Hold: 2;Step Height: 6"   Rocker Board 3 minutes   Knee/Hip Exercises: Supine   Short Arc Quad Sets Strengthening;Right;10 reps;3 sets  3#   Straight Leg Raises Limitations Attempted but unable to perform correctly due to R extensor lag   Modalities   Modalities Designer, multimedia Location R knee    Electrical Stimulation Action IFC   Electrical Stimulation Parameters 1-10 Hz x15 min   Electrical Stimulation Goals Pain   Vasopneumatic   Number Minutes Vasopneumatic  15 minutes   Vasopnuematic Location  Knee   Vasopneumatic Pressure Medium   Vasopneumatic Temperature  34   Manual  Therapy   Manual Therapy Passive ROM   Passive ROM PROM of R knee into flex/ext with gentle holds at end range                  PT Short Term Goals - 10/07/15 1552    PT SHORT TERM GOAL #1   Title Ind with an initial HEP.   Time 3   Period Weeks   Status On-going   PT SHORT TERM GOAL #2   Title Full active right knee extension.   Time 3   Period Weeks   Status On-going           PT Long Term Goals - 09/26/15 1420    PT LONG TERM GOAL #1   Title Ind with an advanced HEP.   Time 6   Period Weeks   Status New   PT LONG TERM GOAL #2   Title Right hip and knee strength= 5/5.   Time 6   Period Weeks   Status New   PT LONG TERM GOAL #3   Title Perform ADL's with pain not > 3/10.               Plan - 10/11/15 1030    Clinical Impression Statement Patient tolerated today's treatment fairly well although she demonstrated fatigue with standing exercises. Continues to demonstrate R extensor lag with SLR and continues to demonstrate R Quad weakness with R SAQ. PROM of R knee continues to be smooth wtih no pain reported by patient. Normal modalities response were noted following removal of the modalities. Continues to present in clinic with FWW for ambulation. Denied any pain or soreness following today's treatment.   Pt will benefit from skilled therapeutic intervention in order to improve on the following deficits Pain;Decreased activity tolerance;Increased edema;Decreased strength;Decreased range of motion   Rehab Potential Good   PT Frequency 3x / week   PT Duration 4 weeks   PT Treatment/Interventions ADLs/Self Care Home Management;Electrical Stimulation;Cryotherapy;Therapeutic exercise;Therapeutic activities;Patient/family education;Manual techniques;Vasopneumatic Device   PT Next Visit Plan Work on [employer]; SLR, Total knee replacement protocol.  No ultrasound.   Consulted and Agree with Plan of Care Patient        Problem List Patient Active Problem List    Diagnosis Date Noted  . Altered mental status 09/17/2015  . S/P right TKA 08/13/2015  . S/P knee replacement 08/13/2015  .  Elevated troponin 03/06/2015  . Fever   . History of diabetes mellitus   . Left buttock pain   . Hyperlipidemia 01/11/2015  . GAD (generalized anxiety disorder) 01/11/2015  . Depression 01/11/2015  . GERD (gastroesophageal reflux disease) 01/11/2015  . Osteopenia 07/13/2014  . Obesity (BMI 30-39.9) 06/19/2014  . Carotid stenosis   . Anemia of chronic disease 12/25/2011  . Iron deficiency anemia 05/27/2011  . ACUTE DIASTOLIC HEART FAILURE 123456  . Type 2 diabetes mellitus with insulin therapy ([place]) 10/08/2009  . DYSLIPIDEMIA 10/08/2009  . Essential hypertension 10/08/2009  . Coronary atherosclerosis 10/08/2009  . CAROTID STENOSIS 10/08/2009    Wynelle Fanny, PTA 10/11/2015, 10:40 AM  [facility] [address] [place], [place], [address] Phone: [phone]   Fax:  [phone]  Name: [name] MRN: [id number] Date of Birth: February 13, 1931

## 2015-10-14 ENCOUNTER — Encounter: Payer: Self-pay | Admitting: Physical Therapy

## 2015-10-14 ENCOUNTER — Ambulatory Visit: Payer: Medicare Other | Admitting: Physical Therapy

## 2015-10-14 DIAGNOSIS — M25561 Pain in right knee: Secondary | ICD-10-CM

## 2015-10-14 DIAGNOSIS — M25661 Stiffness of right knee, not elsewhere classified: Secondary | ICD-10-CM

## 2015-10-14 NOTE — Therapy (Signed)
[facility] [place], [place], [address] Phone: [phone]   Fax:  [phone]  Physical Therapy Treatment  Patient Details  Name: [name] MRN: [id number] Date of Birth: 06-24-1931 Referring Provider: Paralee Cancel MD.  Encounter Date: 10/14/2015      PT End of Session - 10/14/15 1040    Visit Number 6   Number of Visits 12   Date for PT Re-Evaluation 11/14/15   PT Start Time 1034   PT Stop Time 1131   PT Time Calculation (min) 57 min   Activity Tolerance Patient tolerated treatment well;Patient limited by fatigue   Behavior During Therapy [facility] for tasks assessed/performed      Past Medical History  Diagnosis Date  . Anemia   . Hypertension   . Iron deficiency anemia 05/27/2011  . CHF (congestive heart failure) ([place])     EF preserved Echo 2012  . 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 Carotid stenosis   . Renal insufficiency   . Furuncle of back, except buttock   . Vertigo     "when sugar gets low"  . CAD (coronary artery disease)   . Chronic kidney infection   . Arthritis     back, knees, and hips  . Myocardial infarction ([place])   . Balance problems   . Speech problem     from stroke  . Vision loss     left eye-"half blind"  . Hearing loss   . Shortness of breath dyspnea     with activity  . H/O seasonal allergies   . Pneumonia     hx of  . Asthma     with allergies  . Swallowing difficulty   . Diabetes mellitus     type 2  . Anxiety   . Depression   . GERD (gastroesophageal reflux disease)   . Dementia     "a little"    Past Surgical History  Procedure Laterality Date  . I & d of furuncle  April 2013  . Coronary artery bypass graft  2006  . Coronary angioplasty      prior to 2006 5 stents  . Total abdominal hysterectomy  ~81 years old    complete, with tumor removal  . Cholecystectomy    .  Appendectomy      with hysterectomy  . Cataract extraction Bilateral 5 years ago  . Total knee arthroplasty Right 08/13/2015    Procedure: RIGHT  TOTAL KNEE ARTHROPLASTY;  Surgeon: Paralee Cancel, MD;  Location: [facility];  Service: Orthopedics;  Laterality: Right;    There were no vitals filed for this visit.  Visit Diagnosis:  Right knee pain  Knee stiffness, right      Subjective Assessment - 10/14/15 1040    Subjective Reports her R hip was hurting her when she woke up this morning. States that she put heat on her hip this morning.   Limitations Walking   Patient Stated Goals Get out of pain.   Currently in Pain? Yes   Pain Score 3    Pain Location Hip   Pain Orientation Right;Lateral            [facility] PT Assessment - 10/14/15 0001    Assessment   Medical Diagnosis Right total knee replacement.   Onset Date/Surgical Date 08/13/15   Precautions   Precaution Comments No ultrasound.  Be with patient  at all times for safety.   ROM / Strength   AROM / PROM / Strength AROM   AROM   Overall AROM  Within functional limits for tasks performed   AROM Assessment Site Knee   Right/Left Knee Right   Right Knee Extension 0                     [facility] Adult PT Treatment/Exercise - 10/14/15 0001    Knee/Hip Exercises: Aerobic   Nustep L5 x20 min   Knee/Hip Exercises: Standing   Forward Step Up Right;2 sets;10 reps;Hand Hold: 2;Step Height: 6"   Rocker Board 3 minutes   Knee/Hip Exercises: Seated   Long Arc Quad Strengthening;Right;3 sets;10 reps;Weights   Long Arc Quad Weight 4 lbs.   Modalities   Modalities Retail buyer Location R knee    Electrical Stimulation Action IFC   Electrical Stimulation Parameters 1-10 Hz x15 min   Electrical Stimulation Goals Pain   Manual Therapy   Manual Therapy Passive ROM   Passive ROM PROM of R knee into ext with gentle holds at end range                  PT  Short Term Goals - 10/14/15 1121    PT SHORT TERM GOAL #1   Title Ind with an initial HEP.   Time 3   Period Weeks   Status Achieved   PT SHORT TERM GOAL #2   Title Full active right knee extension.   Time 3   Period Weeks   Status Achieved  R knee ext 0 deg 10/14/2015           PT Long Term Goals - 09/26/15 1420    PT LONG TERM GOAL #1   Title Ind with an advanced HEP.   Time 6   Period Weeks   Status New   PT LONG TERM GOAL #2   Title Right hip and knee strength= 5/5.   Time 6   Period Weeks   Status New   PT LONG TERM GOAL #3   Title Perform ADL's with pain not > 3/10.               Plan - 10/14/15 1119    Clinical Impression Statement Patient tolerated today's treatment well although she continues to have the fatigue with standing activities. Required multimodal cueing to avoid trunk extension with LAQ with 4# today.  AROM of the R knee extension measured as 0 deg today in supine. Achieved R knee ext ST goal today in clinic. Remaining goals are on-going secondary to strength deficits. Continues to ambulate with FWW in therapy clinic. Presents with decreased R knee swelling and with a brace on L knee.  Denied R knee pain today following treatment.   Pt will benefit from skilled therapeutic intervention in order to improve on the following deficits Pain;Decreased activity tolerance;Increased edema;Decreased strength;Decreased range of motion   Rehab Potential Good   PT Frequency 3x / week   PT Duration 4 weeks   PT Treatment/Interventions ADLs/Self Care Home Management;Electrical Stimulation;Cryotherapy;Therapeutic exercise;Therapeutic activities;Patient/family education;Manual techniques;Vasopneumatic Device   PT Next Visit Plan Work on [employer]; SLR, Total knee replacement protocol.  No ultrasound.   PT Home Exercise Plan QS, SAQ, ext stretch   Consulted and Agree with Plan of Care Patient        Problem List Patient Active Problem List   Diagnosis Date Noted   .  Altered mental status 09/17/2015  . S/P right TKA 08/13/2015  . S/P knee replacement 08/13/2015  . Elevated troponin 03/06/2015  . Fever   . History of diabetes mellitus   . Left buttock pain   . Hyperlipidemia 01/11/2015  . GAD (generalized anxiety disorder) 01/11/2015  . Depression 01/11/2015  . GERD (gastroesophageal reflux disease) 01/11/2015  . Osteopenia 07/13/2014  . Obesity (BMI 30-39.9) 06/19/2014  . Carotid stenosis   . Anemia of chronic disease 12/25/2011  . Iron deficiency anemia 05/27/2011  . ACUTE DIASTOLIC HEART FAILURE 123456  . Type 2 diabetes mellitus with insulin therapy ([place]) 10/08/2009  . DYSLIPIDEMIA 10/08/2009  . Essential hypertension 10/08/2009  . Coronary atherosclerosis 10/08/2009  . CAROTID STENOSIS 10/08/2009    Wynelle Fanny, PTA 10/14/2015, 11:51 AM  [facility] [address] [place], [place], [address] Phone: [phone]   Fax:  [phone]  Name: [name] MRN: [id number] Date of Birth: 27-Mar-1931

## 2015-10-17 ENCOUNTER — Emergency Department (HOSPITAL_COMMUNITY): Payer: Medicare Other

## 2015-10-17 ENCOUNTER — Encounter (HOSPITAL_COMMUNITY): Payer: Self-pay | Admitting: Emergency Medicine

## 2015-10-17 ENCOUNTER — Emergency Department (HOSPITAL_COMMUNITY)
Admission: EM | Admit: 2015-10-17 | Discharge: 2015-10-17 | Disposition: A | Discharge status: Home / Self Care | Payer: Medicare Other | Attending: Emergency Medicine | Admitting: Emergency Medicine

## 2015-10-17 DIAGNOSIS — Z8673 Personal history of transient ischemic attack (TIA), and cerebral infarction without residual deficits: Secondary | ICD-10-CM | POA: Insufficient documentation

## 2015-10-17 DIAGNOSIS — J45909 Unspecified asthma, uncomplicated: Secondary | ICD-10-CM | POA: Insufficient documentation

## 2015-10-17 DIAGNOSIS — Y9389 Activity, other specified: Secondary | ICD-10-CM | POA: Insufficient documentation

## 2015-10-17 DIAGNOSIS — Z9861 Coronary angioplasty status: Secondary | ICD-10-CM | POA: Insufficient documentation

## 2015-10-17 DIAGNOSIS — Z79899 Other long term (current) drug therapy: Secondary | ICD-10-CM | POA: Diagnosis not present

## 2015-10-17 DIAGNOSIS — K219 Gastro-esophageal reflux disease without esophagitis: Secondary | ICD-10-CM | POA: Insufficient documentation

## 2015-10-17 DIAGNOSIS — Y9289 Other specified places as the place of occurrence of the external cause: Secondary | ICD-10-CM | POA: Insufficient documentation

## 2015-10-17 DIAGNOSIS — I2510 Atherosclerotic heart disease of native coronary artery without angina pectoris: Secondary | ICD-10-CM | POA: Insufficient documentation

## 2015-10-17 DIAGNOSIS — I509 Heart failure, unspecified: Secondary | ICD-10-CM | POA: Diagnosis not present

## 2015-10-17 DIAGNOSIS — H9190 Unspecified hearing loss, unspecified ear: Secondary | ICD-10-CM | POA: Insufficient documentation

## 2015-10-17 DIAGNOSIS — Z872 Personal history of diseases of the skin and subcutaneous tissue: Secondary | ICD-10-CM | POA: Diagnosis not present

## 2015-10-17 DIAGNOSIS — Y998 Other external cause status: Secondary | ICD-10-CM | POA: Insufficient documentation

## 2015-10-17 DIAGNOSIS — F419 Anxiety disorder, unspecified: Secondary | ICD-10-CM | POA: Insufficient documentation

## 2015-10-17 DIAGNOSIS — Z794 Long term (current) use of insulin: Secondary | ICD-10-CM | POA: Diagnosis not present

## 2015-10-17 DIAGNOSIS — F0390 Unspecified dementia without behavioral disturbance: Secondary | ICD-10-CM | POA: Diagnosis not present

## 2015-10-17 DIAGNOSIS — Z951 Presence of aortocoronary bypass graft: Secondary | ICD-10-CM | POA: Insufficient documentation

## 2015-10-17 DIAGNOSIS — H5442 Blindness, left eye, normal vision right eye: Secondary | ICD-10-CM | POA: Insufficient documentation

## 2015-10-17 DIAGNOSIS — W1839XA Other fall on same level, initial encounter: Secondary | ICD-10-CM | POA: Diagnosis not present

## 2015-10-17 DIAGNOSIS — E119 Type 2 diabetes mellitus without complications: Secondary | ICD-10-CM | POA: Insufficient documentation

## 2015-10-17 DIAGNOSIS — I252 Old myocardial infarction: Secondary | ICD-10-CM | POA: Insufficient documentation

## 2015-10-17 DIAGNOSIS — Z8701 Personal history of pneumonia (recurrent): Secondary | ICD-10-CM | POA: Insufficient documentation

## 2015-10-17 DIAGNOSIS — N390 Urinary tract infection, site not specified: Secondary | ICD-10-CM | POA: Diagnosis not present

## 2015-10-17 DIAGNOSIS — F329 Major depressive disorder, single episode, unspecified: Secondary | ICD-10-CM | POA: Diagnosis not present

## 2015-10-17 DIAGNOSIS — I10 Essential (primary) hypertension: Secondary | ICD-10-CM | POA: Insufficient documentation

## 2015-10-17 DIAGNOSIS — S62625A Displaced fracture of medial phalanx of left ring finger, initial encounter for closed fracture: Secondary | ICD-10-CM | POA: Insufficient documentation

## 2015-10-17 DIAGNOSIS — R300 Dysuria: Secondary | ICD-10-CM | POA: Diagnosis present

## 2015-10-17 DIAGNOSIS — S62609A Fracture of unspecified phalanx of unspecified finger, initial encounter for closed fracture: Secondary | ICD-10-CM

## 2015-10-17 DIAGNOSIS — Z7902 Long term (current) use of antithrombotics/antiplatelets: Secondary | ICD-10-CM | POA: Insufficient documentation

## 2015-10-17 DIAGNOSIS — B964 Proteus (mirabilis) (morganii) as the cause of diseases classified elsewhere: Secondary | ICD-10-CM | POA: Diagnosis not present

## 2015-10-17 LAB — URINALYSIS, ROUTINE W REFLEX MICROSCOPIC
Bilirubin Urine: NEGATIVE
GLUCOSE, UA: 250 mg/dL — AB
Ketones, ur: NEGATIVE mg/dL
Nitrite: NEGATIVE
Protein, ur: 30 mg/dL — AB
SPECIFIC GRAVITY, URINE: 1.025 (ref 1.005–1.030)
Urobilinogen, UA: 0.2 mg/dL (ref 0.0–1.0)
pH: 5.5 (ref 5.0–8.0)

## 2015-10-17 LAB — URINE MICROSCOPIC-ADD ON

## 2015-10-17 LAB — CBG MONITORING, ED: GLUCOSE-CAPILLARY: 351 mg/dL — AB (ref 65–99)

## 2015-10-17 MED ORDER — CEPHALEXIN 500 MG PO CAPS
500.0000 mg | ORAL_CAPSULE | Freq: Once | ORAL | Status: AC
  Administered 2015-10-17: 500 mg via ORAL
  Filled 2015-10-17: qty 1

## 2015-10-17 MED ORDER — CEPHALEXIN 500 MG PO CAPS: 500.0000 mg | ORAL_CAPSULE | Freq: Four times a day (QID) | ORAL | Status: DC

## 2015-10-17 NOTE — ED Notes (Signed)
Dr Roderic Palau in prior to RN, see edp assessment for further,

## 2015-10-17 NOTE — Discharge Instructions (Signed)
Follow up with dr. [name] Brochure next week.

## 2015-10-17 NOTE — ED Provider Notes (Signed)
CSN: [id number]     Arrival date & time 10/17/15  1632 History   First MD Initiated Contact with Patient 10/17/15 1916     Chief Complaint  Patient presents with  . Fall  . Hand Injury  . Dysuria     (Consider location/radiation/quality/duration/timing/severity/associated sxs/prior Treatment) Patient is a 81 y.o. female presenting with fall. The history is provided by a relative (The patient fell and hurt her left hand. She also complains of burning on urination).  Fall This is a new problem. The current episode started 12 to 24 hours ago. The problem occurs constantly. The problem has not changed since onset.Pertinent negatives include no chest pain, no abdominal pain and no headaches. Exacerbated by: Moving left hand. Nothing relieves the symptoms. She has tried nothing for the symptoms.    Past Medical History  Diagnosis Date  . Anemia   . Hypertension   . Iron deficiency anemia 05/27/2011  . CHF (congestive heart failure) ([place])     EF preserved Echo 2012  . 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 Carotid stenosis   . Renal insufficiency   . Furuncle of back, except buttock   . Vertigo     "when sugar gets low"  . CAD (coronary artery disease)   . Chronic kidney infection   . Arthritis     back, knees, and hips  . Myocardial infarction ([place])   . Balance problems   . Speech problem     from stroke  . Vision loss     left eye-"half blind"  . Hearing loss   . Shortness of breath dyspnea     with activity  . H/O seasonal allergies   . Pneumonia     hx of  . Asthma     with allergies  . Swallowing difficulty   . Diabetes mellitus     type 2  . Anxiety   . Depression   . GERD (gastroesophageal reflux disease)   . Dementia     "a little"   Past Surgical History  Procedure Laterality Date  . I & d of furuncle  April 2013  . Coronary artery bypass graft  2006  . Coronary  angioplasty      prior to 2006 5 stents  . Total abdominal hysterectomy  ~81 years old    complete, with tumor removal  . Cholecystectomy    . Appendectomy      with hysterectomy  . Cataract extraction Bilateral 5 years ago  . Total knee arthroplasty Right 08/13/2015    Procedure: RIGHT  TOTAL KNEE ARTHROPLASTY;  Surgeon: Paralee Cancel, MD;  Location: [facility];  Service: Orthopedics;  Laterality: Right;   Family History  Problem Relation Age of Onset  . Cancer Brother     porstate  . Early death Sister   . Heart disease Brother   . Heart disease Brother   . Heart attack Brother   . Heart disease Brother   . Heart attack Brother   . Diabetes Sister   . Heart attack Sister   . Diabetes Sister   . Osteoporosis Sister   . Hypertension Sister    Social History  Substance Use Topics  . Smoking status: Never Smoker   . Smokeless tobacco: Never Used  . Alcohol Use: No   OB History    Gravida Para Term Preterm AB TAB SAB Ectopic Multiple Living  4 4 4       3      Review of Systems  Constitutional: Negative for appetite change and fatigue.  HENT: Negative for congestion, ear discharge and sinus pressure.   Eyes: Negative for discharge.  Respiratory: Negative for cough.   Cardiovascular: Negative for chest pain.  Gastrointestinal: Negative for abdominal pain and diarrhea.  Genitourinary: Positive for dysuria. Negative for frequency and hematuria.  Musculoskeletal: Negative for back pain.       Left ring finger pain  Skin: Negative for rash.  Neurological: Negative for seizures and headaches.  Psychiatric/Behavioral: Negative for hallucinations.      Allergies  Advil; Heparin; and Propoxyphene n-acetaminophen  Home Medications   Prior to Admission medications   Medication Sig Start Date End Date Taking? Authorizing Provider  amLODipine (NORVASC) 10 MG tablet TAKE (1) TABLET BY MOUTH ONCE DAILY. 09/05/15  Yes Wardell Honour, MD  atorvastatin (LIPITOR) 20 MG tablet Take  1 tablet (20 mg total) by mouth at bedtime. 09/20/14  Yes Lysbeth Penner, FNP  clopidogrel (PLAVIX) 75 MG tablet TAKE (1) TABLET BY MOUTH ONCE DAILY. 10/04/15  Yes Wardell Honour, MD  docusate sodium (COLACE) 100 MG capsule Take 1 capsule (100 mg total) by mouth 2 (two) times daily. 08/15/15  Yes Matthew Babish, PA-C  esomeprazole (NEXIUM) 40 MG capsule TAKE 1 CAPSULE BY MOUTH ONCE A DAY. 09/05/15  Yes Wardell Honour, MD  ferrous sulfate 325 (65 FE) MG tablet Take 1 tablet (325 mg total) by mouth 3 (three) times daily after meals. Patient taking differently: Take 325 mg by mouth 2 (two) times daily with a meal.  08/15/15  Yes Danae Orleans, PA-C  insulin NPH Human (HUMULIN N) 100 UNIT/ML injection Inject 15 units each morning with breakfast Patient taking differently: Inject 25 Units into the skin daily with breakfast.  03/11/15  Yes Florencia Reasons, MD  insulin regular (HUMULIN R) 100 units/mL injection Humulin R sliding scale: Blood sugar 120-150 3units                       151-200 4units                      201-250 7units                     251- 300 11units                     301- 350 15units                      351-400  20units                      > 400 call MD immdediately Patient taking differently: Inject 3-20 Units into the skin 3 (three) times daily as needed (SLIDING SCALE LISTED). Humulin R sliding scale: Blood sugar 120-150 3units                       151-200 4units                      201-250 7units                     251- 300 11units  301- 350 15units                      351-400  20units                      > 400 call MD immdediately 03/11/15  Yes Florencia Reasons, MD  isosorbide mononitrate (IMDUR) 30 MG 24 hr tablet Take 1 tablet (30 mg total) by mouth daily. 07/03/15  Yes Wardell Honour, MD  lisinopril (PRINIVIL,ZESTRIL) 20 MG tablet TAKE (1) TABLET BY MOUTH ONCE DAILY. 06/06/15  Yes Wardell Honour, MD  LORazepam (ATIVAN) 0.5 MG tablet TAKE (1) TABLET TWICE  DAILY. 10/08/15  Yes Wardell Honour, MD  metFORMIN (GLUCOPHAGE-XR) 500 MG 24 hr tablet TAKE 1 TABLET WITH BREAKFAST AND 1 TABLET WITH SUPPER. 10/07/15  Yes Wardell Honour, MD  metoprolol (LOPRESSOR) 50 MG tablet Take 1 tablet (50 mg total) by mouth 2 (two) times daily. 09/20/14  Yes Lysbeth Penner, FNP  Multiple Vitamins-Iron (DAILY VITAMINS/IRON/BETA CAROT PO) Take 1 tablet by mouth at bedtime.   Yes Historical Provider, MD  PARoxetine (PAXIL) 20 MG tablet Take 1 tablet (20 mg total) by mouth daily. 09/20/14  Yes Lysbeth Penner, FNP  traMADol (ULTRAM) 50 MG tablet Take one tablet by mouth every 6 hours as needed for pain. Monitor for increased confusion, sedation, allergic reaction Patient taking differently: Take 50 mg by mouth every 6 (six) hours as needed for moderate pain. Take one tablet by mouth every 6 hours as needed for pain. Monitor for increased confusion, sedation, allergic reaction 08/19/15  Yes Tiffany L Reed, DO  Artificial Tear Ointment (DRY EYES OP) Apply 1 drop to eye daily as needed. For dry eye/ red eye     Historical Provider, MD  cephALEXin (KEFLEX) 500 MG capsule Take 1 capsule (500 mg total) by mouth 4 (four) times daily. 10/17/15   [name], MD  EASY TOUCH INSULIN SYRINGE 31G X 5/16" 1 ML MISC USE AS DIRECTED. Patient not taking: Reported on 08/02/2015 02/07/15   Chipper Herb, MD  furosemide (LASIX) 20 MG tablet Take 1 tablet (20 mg total) by mouth daily as needed for fluid or edema. 09/20/14   Lysbeth Penner, FNP  HYDROcodone-acetaminophen (NORCO) 7.5-325 MG per tablet Take 1-2 tablets by mouth every 4 (four) hours as needed for moderate pain. Patient not taking: Reported on 10/17/2015 08/15/15   Danae Orleans, PA-C  meclizine (ANTIVERT) 25 MG tablet TAKE 1 TABLET TWICE DAILY AS NEEDED FOR DIZZINESS. Patient taking differently: TAKE 1 TABLET TWICE DAILY FOR DIZZINESS. 08/05/15   Chipper Herb, MD  nitroGLYCERIN (NITROSTAT) 0.4 MG SL tablet PLACE ONE (1) TABLET  UNDER TONGUE EVERY 5 MINUTES UP TO (3) DOSES AS NEEDED FOR CHEST PAIN. 06/06/15   Wardell Honour, MD  NITROSTAT 0.4 MG SL tablet PLACE 1 TABLET UNDER TONGUE EVERY FIVE MINUTES AS NEEDED FOR CHEST PAIN. Patient not taking: Reported on 10/17/2015 09/05/15   Wardell Honour, MD  polyethylene glycol [facility] / Floria Raveling) packet Take 17 g by mouth 2 (two) times daily. Patient taking differently: Take 17 g by mouth daily as needed for mild constipation.  08/15/15   Danae Orleans, PA-C  tiZANidine (ZANAFLEX) 4 MG tablet Take 1 tablet (4 mg total) by mouth every 6 (six) hours as needed for muscle spasms. 08/15/15   Danae Orleans, PA-C   BP 142/67 mmHg  Pulse 65  Temp(Src) 98 F (36.7 C) (Oral)  Resp 20  SpO2 96% Physical Exam  Constitutional: She is oriented to person, place, and time. She appears well-developed.  HENT:  Head: Normocephalic.  Eyes: Conjunctivae and EOM are normal. No scleral icterus.  Neck: Neck supple. No thyromegaly present.  Cardiovascular: Normal rate and regular rhythm.  Exam reveals no gallop and no friction rub.   No murmur heard. Pulmonary/Chest: No stridor. She has no wheezes. She has no rales. She exhibits no tenderness.  Abdominal: She exhibits no distension. There is no tenderness. There is no rebound.  Musculoskeletal: Normal range of motion. She exhibits no edema.  Swollen left ring finger with tenderness  Lymphadenopathy:    She has no cervical adenopathy.  Neurological: She is oriented to person, place, and time. She exhibits normal muscle tone. Coordination normal.  Skin: No rash noted. No erythema.  Psychiatric: She has a normal mood and affect. Her behavior is normal.    ED Course  Procedures (including critical care time) Labs Review Labs Reviewed  URINALYSIS, ROUTINE W REFLEX MICROSCOPIC (NOT AT [facility]) - Abnormal; Notable for the following:    APPearance CLOUDY (*)    Glucose, UA 250 (*)    Hgb urine dipstick TRACE (*)    Protein, ur 30 (*)     Leukocytes, UA SMALL (*)    All other components within normal limits  URINE MICROSCOPIC-ADD ON - Abnormal; Notable for the following:    Squamous Epithelial / LPF MANY (*)    Bacteria, UA MANY (*)    All other components within normal limits  CBG MONITORING, ED - Abnormal; Notable for the following:    Glucose-Capillary 351 (*)    All other components within normal limits  URINE CULTURE    Imaging Review Dg Hand Complete Left  10/17/2015  CLINICAL DATA:  Post fall onto outstretched left hand now with left middle finger and ring pain. EXAM: LEFT HAND - COMPLETE 3+ VIEW COMPARISON:  None. FINDINGS: There is a slightly comminuted and very minimally displaced fracture involving the mid aspect of the middle phalanx of the fourth digit. No definite intra-articular extension. Joint spaces are preserved. No erosions. No evidence of chondrocalcinosis. Regional soft tissues appear normal. No radiopaque foreign body. IMPRESSION: Slightly comminuted and minimally displaced fracture involving the midshaft of the middle phalanx of the fourth digit without definitive intra-articular extension. No radiopaque foreign body. Electronically Signed   By: Sandi Mariscal M.D.   On: 10/17/2015 17:31   I have personally reviewed and evaluated these images and lab results as part of my medical decision-making.   EKG Interpretation None      MDM   Final diagnoses:  UTI (lower urinary tract infection)  Finger fracture, left, closed, initial encounter   patient has urinary tract infection will be treated with Keflex. And a fractured left ring finger splinted and referred to orthopedic    [name], MD 10/17/15 2041

## 2015-10-17 NOTE — ED Notes (Addendum)
Family member states patient fell today injuring her left hand. Patient complaining of pain to left hand. Also complaining of burning with urination.

## 2015-10-18 ENCOUNTER — Encounter: Payer: Self-pay | Admitting: Physical Therapy

## 2015-10-18 ENCOUNTER — Ambulatory Visit: Payer: Medicare Other | Admitting: Physical Therapy

## 2015-10-18 DIAGNOSIS — M25661 Stiffness of right knee, not elsewhere classified: Secondary | ICD-10-CM

## 2015-10-18 DIAGNOSIS — M25561 Pain in right knee: Secondary | ICD-10-CM | POA: Diagnosis not present

## 2015-10-18 NOTE — Therapy (Signed)
[facility] [place], [place], [address] Phone: [phone]   Fax:  [phone]  Physical Therapy Treatment  Patient Details  Name: [name] MRN: [id number] Date of Birth: 10-21-1931 Referring Provider: Paralee Cancel MD.  Encounter Date: 10/18/2015      PT End of Session - 10/18/15 1042    Visit Number 7   Number of Visits 12   Date for PT Re-Evaluation 11/14/15   PT Start Time [id number]   PT Stop Time 1126   PT Time Calculation (min) 51 min   Activity Tolerance Patient tolerated treatment well;Patient limited by fatigue   Behavior During Therapy [facility] for tasks assessed/performed      Past Medical History  Diagnosis Date  . Anemia   . Hypertension   . Iron deficiency anemia 05/27/2011  . CHF (congestive heart failure) ([place])     EF preserved Echo 2012  . 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 Carotid stenosis   . Renal insufficiency   . Furuncle of back, except buttock   . Vertigo     "when sugar gets low"  . CAD (coronary artery disease)   . Chronic kidney infection   . Arthritis     back, knees, and hips  . Myocardial infarction ([place])   . Balance problems   . Speech problem     from stroke  . Vision loss     left eye-"half blind"  . Hearing loss   . Shortness of breath dyspnea     with activity  . H/O seasonal allergies   . Pneumonia     hx of  . Asthma     with allergies  . Swallowing difficulty   . Diabetes mellitus     type 2  . Anxiety   . Depression   . GERD (gastroesophageal reflux disease)   . Dementia     "a little"    Past Surgical History  Procedure Laterality Date  . I & d of furuncle  April 2013  . Coronary artery bypass graft  2006  . Coronary angioplasty      prior to 2006 5 stents  . Total abdominal hysterectomy  ~81 years old    complete, with tumor removal  . Cholecystectomy    .  Appendectomy      with hysterectomy  . Cataract extraction Bilateral 5 years ago  . Total knee arthroplasty Right 08/13/2015    Procedure: RIGHT  TOTAL KNEE ARTHROPLASTY;  Surgeon: Paralee Cancel, MD;  Location: [facility];  Service: Orthopedics;  Laterality: Right;    There were no vitals filed for this visit.  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 Patient reports that she did not fall towards her R knee.   Limitations Walking   Patient Stated Goals Get out of pain.   Currently in Pain? Yes  Gave no numerical rating   Pain Location Knee   Pain Orientation Right   Pain Descriptors / Indicators Sore   Pain Type Acute pain            [facility] PT Assessment - 10/18/15 0001    Assessment   Medical Diagnosis  Right total knee replacement.   Onset Date/Surgical Date 08/13/15   Precautions   Precaution Comments No ultrasound.  Be with patient at all times for safety.                     [facility] Adult PT Treatment/Exercise - 10/18/15 0001    Knee/Hip Exercises: Aerobic   Nustep L5 x18 min   Knee/Hip Exercises: Standing   Hip Abduction AROM;Right;2 sets;10 reps;Knee straight   Forward Step Up Right;1 set;15 reps;Hand Hold: 2;Step Height: 6"   Rocker Board 3 minutes   Knee/Hip Exercises: Supine   Short Arc Quad Sets Strengthening;Right;2 sets;10 reps   Short Arc Quad Sets Limitations 3#   Modalities   Modalities Print production planner R knee    Printmaker Action IFC   Electrical Stimulation Parameters 1-10 Hz x15 min   Electrical Stimulation Goals Pain   Vasopneumatic   Number Minutes Vasopneumatic  15 minutes   Vasopnuematic Location  Knee   Vasopneumatic Pressure Medium    Vasopneumatic Temperature  34                  PT Short Term Goals - 10/14/15 1121    PT SHORT TERM GOAL #1   Title Ind with an initial HEP.   Time 3   Period Weeks   Status Achieved   PT SHORT TERM GOAL #2   Title Full active right knee extension.   Time 3   Period Weeks   Status Achieved  R knee ext 0 deg 10/14/2015           PT Long Term Goals - 09/26/15 1420    PT LONG TERM GOAL #1   Title Ind with an advanced HEP.   Time 6   Period Weeks   Status New   PT LONG TERM GOAL #2   Title Right hip and knee strength= 5/5.   Time 6   Period Weeks   Status New   PT LONG TERM GOAL #3   Title Perform ADL's with pain not > 3/10.               Plan - 10/18/15 1114    Clinical Impression Statement Patient tolerated today's treatment fairly well although she had increased soreness in R hip and from L side from her fall yesterday. Reported increased fatigue today as well. Continues to demonstrate R Quad weakness with SAQ amd 3#. Presented in clinic with rollator and splint on the L 4th phalange. Normal modalities response noted following removal of the modalities. Continued to experience R hip soreness following today's treatment.   Pt will benefit from skilled therapeutic intervention in order to improve on the following deficits Pain;Decreased activity tolerance;Increased edema;Decreased strength;Decreased range of motion   Rehab Potential Good   PT Frequency 3x / week   PT Duration 4 weeks   PT Treatment/Interventions ADLs/Self Care Home Management;Electrical Stimulation;Cryotherapy;Therapeutic exercise;Therapeutic activities;Patient/family education;Manual techniques;Vasopneumatic Device   PT Next Visit Plan Work on [employer]; SLR, Total knee replacement protocol.  No ultrasound.   PT Home Exercise Plan QS, SAQ, ext stretch   Consulted and Agree with Plan of Care Patient        Problem List Patient Active Problem List   Diagnosis Date Noted  . Altered mental  status 09/17/2015  . S/P right TKA 08/13/2015  . S/P knee replacement 08/13/2015  . Elevated troponin 03/06/2015  . Fever   .  History of diabetes mellitus   . Left buttock pain   . Hyperlipidemia 01/11/2015  . GAD (generalized anxiety disorder) 01/11/2015  . Depression 01/11/2015  . GERD (gastroesophageal reflux disease) 01/11/2015  . Osteopenia 07/13/2014  . Obesity (BMI 30-39.9) 06/19/2014  . Carotid stenosis   . Anemia of chronic disease 12/25/2011  . Iron deficiency anemia 05/27/2011  . ACUTE DIASTOLIC HEART FAILURE [id number]  . Type 2 diabetes mellitus with insulin therapy ([place]) 10/08/2009  . DYSLIPIDEMIA 10/08/2009  . Essential hypertension 10/08/2009  . Coronary atherosclerosis 10/08/2009  . CAROTID STENOSIS 10/08/2009    Wynelle Fanny, PTA 10/18/2015, 11:34 AM  [facility] [address] [place], [place], [address] Phone: [phone]   Fax:  [phone]  Name: [name] MRN: [id number] Date of Birth: [birth year]

## 2015-10-21 ENCOUNTER — Ambulatory Visit: Payer: Medicare Other | Admitting: Physical Therapy

## 2015-10-21 DIAGNOSIS — M25561 Pain in right knee: Secondary | ICD-10-CM | POA: Diagnosis not present

## 2015-10-21 DIAGNOSIS — M25661 Stiffness of right knee, not elsewhere classified: Secondary | ICD-10-CM | POA: Diagnosis not present

## 2015-10-21 LAB — URINE CULTURE: Special Requests: NORMAL

## 2015-10-21 NOTE — Therapy (Signed)
[facility] [place], [place], [address] Phone: [phone]   Fax:  [phone]  Physical Therapy Treatment  Patient Details  Name: [name] MRN: [id number] Date of Birth: 10/29/1931 Referring Provider: Paralee Cancel MD.  Encounter Date: 10/21/2015      PT End of Session - 10/21/15 0949    Visit Number 8   Number of Visits 12   Date for PT Re-Evaluation 11/14/15   PT Start Time 0949   PT Stop Time 1049   PT Time Calculation (min) 60 min   Activity Tolerance Patient tolerated treatment well   Behavior During Therapy [facility] for tasks assessed/performed      Past Medical History  Diagnosis Date  . Anemia   . Hypertension   . Iron deficiency anemia 05/27/2011  . CHF (congestive heart failure) ([place])     EF preserved Echo 2012  . 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 Carotid stenosis   . Renal insufficiency   . Furuncle of back, except buttock   . Vertigo     "when sugar gets low"  . CAD (coronary artery disease)   . Chronic kidney infection   . Arthritis     back, knees, and hips  . Myocardial infarction ([place])   . Balance problems   . Speech problem     from stroke  . Vision loss     left eye-"half blind"  . Hearing loss   . Shortness of breath dyspnea     with activity  . H/O seasonal allergies   . Pneumonia     hx of  . Asthma     with allergies  . Swallowing difficulty   . Diabetes mellitus     type 2  . Anxiety   . Depression   . GERD (gastroesophageal reflux disease)   . Dementia     "a little"    Past Surgical History  Procedure Laterality Date  . I & d of furuncle  April 2013  . Coronary artery bypass graft  2006  . Coronary angioplasty      prior to 2006 5 stents  . Total abdominal hysterectomy  ~81 years old    complete, with tumor removal  . Cholecystectomy    . Appendectomy      with  hysterectomy  . Cataract extraction Bilateral 5 years ago  . Total knee arthroplasty Right 08/13/2015    Procedure: RIGHT  TOTAL KNEE ARTHROPLASTY;  Surgeon: Paralee Cancel, MD;  Location: [facility];  Service: Orthopedics;  Laterality: Right;    There were no vitals filed for this visit.  Visit Diagnosis:  Right knee pain      Subjective Assessment - 10/21/15 0951    Subjective Patient presents today with c/o pain in R hip and along R ITB to knee.   Patient Stated Goals Get out of pain.   Currently in Pain? Yes   Pain Score 6    Pain Location Knee   Pain Orientation Right   Pain Descriptors / Indicators Sore   Pain Type Acute pain   Pain Radiating Towards R hip   Aggravating Factors  standing and walking   Pain Relieving Factors sitting   Effect of Pain on Daily Activities limited            [facility] PT Assessment - 10/21/15 0001    Assessment   Medical  Diagnosis Right total knee replacement.   Onset Date/Surgical Date 08/13/15   ROM / Strength   AROM / PROM / Strength Strength   Strength   Strength Assessment Site Hip;Knee   Right/Left Hip Right   Right Hip Flexion 4/5   Right Hip Extension 4+/5   Right Hip ABduction 4/5   Right/Left Knee Right   Right Knee Flexion 4+/5   Right Knee Extension 4+/5                     [facility] Adult PT Treatment/Exercise - 10/21/15 0001    Knee/Hip Exercises: Aerobic   Nustep L5 x10 min   Knee/Hip Exercises: Supine   Short Arc Quad Sets Strengthening;Right;2 sets;10 reps   Short Arc Quad Sets Limitations 3   Bridges Limitations 4 reps c/o pain in R hip flexor/abdominal region   Straight Leg Raises Strengthening;Right;10 reps  then 1x5   Other Supine Knee/Hip Exercises ABD 2 x 6;   Other Supine Knee/Hip Exercises Clams with red TB x 20   Modalities   Modalities Electrical Stimulation;Vasopneumatic   Electrical Stimulation   Electrical Stimulation Location R knee/hip   Electrical Stimulation Action premod   Electrical  Stimulation Parameters 80-150 hz to tolerance x 15 to tolerance   Electrical Stimulation Goals Pain                  PT Short Term Goals - 10/14/15 1121    PT SHORT TERM GOAL #1   Title Ind with an initial HEP.   Time 3   Period Weeks   Status Achieved   PT SHORT TERM GOAL #2   Title Full active right knee extension.   Time 3   Period Weeks   Status Achieved  R knee ext 0 deg 10/14/2015           PT Long Term Goals - 10/21/15 0957    PT LONG TERM GOAL #1   Title Ind with an advanced HEP.   Time 6   Period Weeks   Status On-going   PT LONG TERM GOAL #2   Title Right hip and knee strength= 5/5.   Time 6   Period Weeks   Status On-going   PT LONG TERM GOAL #3   Title Perform ADL's with pain not > 3/10.   Time 6   Period Weeks   Status On-going               Plan - 10/21/15 1040    Clinical Impression Statement Patient tolerated therex today with some c/o of pain in R hip (SI area) and also in the R hip flexor, particularly with bridging. She was able to perform SLR with intermittent quad lag and no c/o hip flexor pain. She had an adhesive heat pad on her right SIJ for pain  She still has significant weakness in R hip ABD. Goals are ongoing.   Rehab Potential Good   PT Frequency 3x / week   PT Duration 4 weeks   PT Treatment/Interventions ADLs/Self Care Home Management;Electrical Stimulation;Cryotherapy;Therapeutic exercise;Therapeutic activities;Patient/family education;Manual techniques;Vasopneumatic Device   PT Next Visit Plan Continue TKE, SLR, hip flexion, ABD and bridging as tolerated. Monitor R SIJ pain.   Consulted and Agree with Plan of Care Patient        Problem List Patient Active Problem List   Diagnosis Date Noted  . Altered mental status 09/17/2015  . S/P right TKA 08/13/2015  . S/P knee replacement 08/13/2015  .  Elevated troponin 03/06/2015  . Fever   . History of diabetes mellitus   . Left buttock pain   . Hyperlipidemia  01/11/2015  . GAD (generalized anxiety disorder) 01/11/2015  . Depression 01/11/2015  . GERD (gastroesophageal reflux disease) 01/11/2015  . Osteopenia 07/13/2014  . Obesity (BMI 30-39.9) 06/19/2014  . Carotid stenosis   . Anemia of chronic disease 12/25/2011  . Iron deficiency anemia 05/27/2011  . ACUTE DIASTOLIC HEART FAILURE 123456  . Type 2 diabetes mellitus with insulin therapy ([place]) 10/08/2009  . DYSLIPIDEMIA 10/08/2009  . Essential hypertension 10/08/2009  . Coronary atherosclerosis 10/08/2009  . CAROTID STENOSIS 10/08/2009    Madelyn Flavors PT  10/21/2015, 10:56 AM  [facility] [address] [place], [place], [address] Phone: [phone]   Fax:  [phone]  Name: [name] MRN: [id number] Date of Birth: 1931/05/15

## 2015-10-22 ENCOUNTER — Telehealth (HOSPITAL_COMMUNITY): Payer: Self-pay

## 2015-10-22 NOTE — Telephone Encounter (Signed)
Post ED Visit - Positive Culture Follow-up  Culture report reviewed by antimicrobial stewardship pharmacist:  []  Elenor Quinones, Pharm.D. []  Heide Guile, Pharm.D., BCPS [x]  Parks Neptune, Pharm.D. []  Alycia Rossetti, Pharm.D., BCPS []  [place], Pharm.D., BCPS, AAHIVP []  Legrand Como, Pharm.D., BCPS, AAHIVP []  Milus Glazier, Pharm.D. []  Stephens November, Pharm.D.  Positive urine culture Treated with *cephalexin , organism sensitive to the same and no further patient follow-up is required at this time.  Ileene Musa 10/22/2015, 1:19 PM

## 2015-10-25 ENCOUNTER — Encounter: Payer: Self-pay | Admitting: Physical Therapy

## 2015-10-25 ENCOUNTER — Ambulatory Visit: Payer: Medicare Other | Admitting: Physical Therapy

## 2015-10-25 DIAGNOSIS — M25661 Stiffness of right knee, not elsewhere classified: Secondary | ICD-10-CM

## 2015-10-25 DIAGNOSIS — M25561 Pain in right knee: Secondary | ICD-10-CM | POA: Diagnosis not present

## 2015-10-25 NOTE — Therapy (Signed)
[facility] [place], [place], [address] Phone: [phone]   Fax:  [phone]  Physical Therapy Treatment  Patient Details  Name: [name] MRN: [id number] Date of Birth: 04/08/1931 Referring Provider: Paralee Cancel MD.  Encounter Date: 10/25/2015      PT End of Session - 10/25/15 0948    Visit Number 9   Number of Visits 12   Date for PT Re-Evaluation 11/14/15   PT Start Time 0947   PT Stop Time (p) 1040   PT Time Calculation (min) (p) 53 min   Activity Tolerance Patient tolerated treatment well   Behavior During Therapy [facility] for tasks assessed/performed      Past Medical History  Diagnosis Date  . Anemia   . Hypertension   . Iron deficiency anemia 05/27/2011  . CHF (congestive heart failure) ([place])     EF preserved Echo 2012  . 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 Carotid stenosis   . Renal insufficiency   . Furuncle of back, except buttock   . Vertigo     "when sugar gets low"  . CAD (coronary artery disease)   . Chronic kidney infection   . Arthritis     back, knees, and hips  . Myocardial infarction ([place])   . Balance problems   . Speech problem     from stroke  . Vision loss     left eye-"half blind"  . Hearing loss   . Shortness of breath dyspnea     with activity  . H/O seasonal allergies   . Pneumonia     hx of  . Asthma     with allergies  . Swallowing difficulty   . Diabetes mellitus     type 2  . Anxiety   . Depression   . GERD (gastroesophageal reflux disease)   . Dementia     "a little"    Past Surgical History  Procedure Laterality Date  . I & d of furuncle  April 2013  . Coronary artery bypass graft  2006  . Coronary angioplasty      prior to 2006 5 stents  . Total abdominal hysterectomy  ~81 years old    complete, with tumor removal  . Cholecystectomy    . Appendectomy     with hysterectomy  . Cataract extraction Bilateral 5 years ago  . Total knee arthroplasty Right 08/13/2015    Procedure: RIGHT  TOTAL KNEE ARTHROPLASTY;  Surgeon: Paralee Cancel, MD;  Location: [facility];  Service: Orthopedics;  Laterality: Right;    There were no vitals filed for this visit.  Visit Diagnosis:  Right knee pain  Knee stiffness, right      Subjective Assessment - 10/25/15 0948    Subjective Reports that her R hip hurts a little bit.   Limitations Walking   Patient Stated Goals Get out of pain.   Currently in Pain? Yes   Pain Score 7    Pain Location Hip   Pain Orientation Right;Posterior   Pain Descriptors / Indicators Sore   Pain Type Acute pain   Pain Frequency Intermittent            [facility] PT Assessment - 10/25/15 0001    Assessment   Medical Diagnosis Right total knee replacement.   Onset Date/Surgical Date 08/13/15  [facility] Adult PT Treatment/Exercise - 10/25/15 0001    Knee/Hip Exercises: Aerobic   Nustep L5 x17 min   Knee/Hip Exercises: Supine   Short Arc Quad Sets Strengthening;Right;2 sets;10 reps   Short Arc Quad Sets Limitations 4   Bridges Limitations 3 sets of 5 reps  Reports posterior R hip pain and some anterior pelvic pain   Straight Leg Raises Strengthening;Right;4 sets;5 reps   Other Supine Knee/Hip Exercises Supine BLE clams with red theraband x20  reps   Modalities   Modalities Designer, multimedia Location R knee   Electrical Stimulation Action IFC   Electrical Stimulation Parameters 80-150 Hz x15 min   Electrical Stimulation Goals Pain   Vasopneumatic   Number Minutes Vasopneumatic  15 minutes   Vasopnuematic Location  Knee   Vasopneumatic Pressure Medium   Vasopneumatic Temperature  49                  PT Short Term Goals - 10/14/15 1121    PT SHORT TERM GOAL #1   Title Ind with an initial HEP.   Time 3   Period  Weeks   Status Achieved   PT SHORT TERM GOAL #2   Title Full active right knee extension.   Time 3   Period Weeks   Status Achieved  R knee ext 0 deg 10/14/2015           PT Long Term Goals - 10/21/15 0957    PT LONG TERM GOAL #1   Title Ind with an advanced HEP.   Time 6   Period Weeks   Status On-going   PT LONG TERM GOAL #2   Title Right hip and knee strength= 5/5.   Time 6   Period Weeks   Status On-going   PT LONG TERM GOAL #3   Title Perform ADL's with pain not > 3/10.   Time 6   Period Weeks   Status On-going               Plan - 10/25/15 1026    Clinical Impression Statement Patient tolerated today's exercise fairly well although she continues to demonstrate R knee and hip weakness. Required shorter repititions and rest breaks between exericses today. Only complained of anteior pelvic discomfort during bridging and at other times only R posterior hip soreness. Patient reported that soreness in R posterior hip was just below where a boil is in her R low back that she reports comes back every year. Normal modalities response noted following removal of the modalities. Patient experienced feeling "a little sore" folllowing today's treatment.   Pt will benefit from skilled therapeutic intervention in order to improve on the following deficits Pain;Decreased activity tolerance;Increased edema;Decreased strength;Decreased range of motion   Rehab Potential Good   PT Frequency 3x / week   PT Duration 4 weeks   PT Treatment/Interventions ADLs/Self Care Home Management;Electrical Stimulation;Cryotherapy;Therapeutic exercise;Therapeutic activities;Patient/family education;Manual techniques;Vasopneumatic Device   PT Next Visit Plan Continue TKE, SLR, hip flexion, ABD and bridging as tolerated. Monitor R SIJ pain.   PT Home Exercise Plan QS, SAQ, ext stretch   Consulted and Agree with Plan of Care Patient        Problem List Patient Active Problem List   Diagnosis  Date Noted  . Altered mental status 09/17/2015  . S/P right TKA 08/13/2015  . S/P knee replacement 08/13/2015  . Elevated troponin 03/06/2015  . Fever   . History of diabetes  mellitus   . Left buttock pain   . Hyperlipidemia 01/11/2015  . GAD (generalized anxiety disorder) 01/11/2015  . Depression 01/11/2015  . GERD (gastroesophageal reflux disease) 01/11/2015  . Osteopenia 07/13/2014  . Obesity (BMI 30-39.9) 06/19/2014  . Carotid stenosis   . Anemia of chronic disease 12/25/2011  . Iron deficiency anemia 05/27/2011  . ACUTE DIASTOLIC HEART FAILURE 123456  . Type 2 diabetes mellitus with insulin therapy ([place]) 10/08/2009  . DYSLIPIDEMIA 10/08/2009  . Essential hypertension 10/08/2009  . Coronary atherosclerosis 10/08/2009  . CAROTID STENOSIS 10/08/2009    Wynelle Fanny, PTA 10/25/2015, 12:24 PM  [facility] [place], [place], [address] Phone: [phone]   Fax:  [phone]  Name: [name] MRN: [id number] Date of Birth: 05-Jan-1931

## 2015-10-28 ENCOUNTER — Ambulatory Visit: Payer: Medicare Other | Admitting: Physical Therapy

## 2015-10-28 ENCOUNTER — Encounter: Payer: Self-pay | Admitting: Physical Therapy

## 2015-10-28 DIAGNOSIS — M25561 Pain in right knee: Secondary | ICD-10-CM

## 2015-10-28 DIAGNOSIS — M25661 Stiffness of right knee, not elsewhere classified: Secondary | ICD-10-CM | POA: Diagnosis not present

## 2015-10-28 NOTE — Therapy (Signed)
[name], [place], [address] Phone: [phone]   Fax:  [phone]  Physical Therapy Treatment  Patient Details  Name: [name] MRN: [id number] Date of Birth: 1931-11-29 Referring Provider: Paralee Cancel MD.  Encounter Date: 10/28/2015      PT End of Session - 10/28/15 0950    Visit Number 10   Number of Visits 12   Date for PT Re-Evaluation 11/14/15   PT Start Time 0950   PT Stop Time 1041   PT Time Calculation (min) 51 min   Activity Tolerance Patient tolerated treatment well;Patient limited by fatigue   Behavior During Therapy [facility] for tasks assessed/performed      Past Medical History  Diagnosis Date  . Anemia   . Hypertension   . Iron deficiency anemia 05/27/2011  . CHF (congestive heart failure) ([place])     EF preserved Echo 2012  . 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 Carotid stenosis   . Renal insufficiency   . Furuncle of back, except buttock   . Vertigo     "when sugar gets low"  . CAD (coronary artery disease)   . Chronic kidney infection   . Arthritis     back, knees, and hips  . Myocardial infarction ([place])   . Balance problems   . Speech problem     from stroke  . Vision loss     left eye-"half blind"  . Hearing loss   . Shortness of breath dyspnea     with activity  . H/O seasonal allergies   . Pneumonia     hx of  . Asthma     with allergies  . Swallowing difficulty   . Diabetes mellitus     type 2  . Anxiety   . Depression   . GERD (gastroesophageal reflux disease)   . Dementia     "a little"    Past Surgical History  Procedure Laterality Date  . I & d of furuncle  April 2013  . Coronary artery bypass graft  2006  . Coronary angioplasty      prior to 2006 5 stents  . Total abdominal hysterectomy  ~81 years old    complete, with tumor removal  . Cholecystectomy    .  Appendectomy      with hysterectomy  . Cataract extraction Bilateral 5 years ago  . Total knee arthroplasty Right 08/13/2015    Procedure: RIGHT  TOTAL KNEE ARTHROPLASTY;  Surgeon: Paralee Cancel, MD;  Location: [facility];  Service: Orthopedics;  Laterality: Right;    There were no vitals filed for this visit.  Visit Diagnosis:  Right knee pain  Knee stiffness, right      Subjective Assessment - 10/28/15 0950    Subjective Reports that her legs feel weak today and reported pain when she woke this morning. Reports that she fell Saturday while stepping into doorway and lost her footing and states that she twisted.   Limitations Walking   Patient Stated Goals Get out of pain.   Currently in Pain? Yes   Pain Score 6    Pain Location Hip   Pain Orientation Right;Lateral   Pain Descriptors / Indicators Sore   Pain Type Acute pain   Pain Frequency Intermittent            [facility] PT Assessment - 10/28/15 0001  Assessment   Medical Diagnosis Right total knee replacement.   Onset Date/Surgical Date 08/13/15   Next MD Visit 11/06/2015                     [facility] Adult PT Treatment/Exercise - 10/28/15 0001    Knee/Hip Exercises: Aerobic   Nustep L6 x15 min   Knee/Hip Exercises: Supine   Short Arc Quad Sets Strengthening;Right;2 sets;10 reps   Short Arc Quad Sets Limitations 4   Bridges Limitations 4 sets of 5 reps   Straight Leg Raises Strengthening;Right;4 sets;5 reps   Other Supine Knee/Hip Exercises BLE marching 2x10 reps    Other Supine Knee/Hip Exercises Supine BLE clams with red theraband x25  reps   Modalities   Modalities Designer, multimedia Location R knee   Electrical Stimulation Action IFC   Electrical Stimulation Parameters 1-10 Hz x15 mn   Electrical Stimulation Goals Pain   Vasopneumatic   Number Minutes Vasopneumatic  15 minutes   Vasopnuematic Location  Knee   Vasopneumatic Pressure  Medium                  PT Short Term Goals - 10/14/15 1121    PT SHORT TERM GOAL #1   Title Ind with an initial HEP.   Time 3   Period Weeks   Status Achieved   PT SHORT TERM GOAL #2   Title Full active right knee extension.   Time 3   Period Weeks   Status Achieved  R knee ext 0 deg 10/14/2015           PT Long Term Goals - 10/21/15 0957    PT LONG TERM GOAL #1   Title Ind with an advanced HEP.   Time 6   Period Weeks   Status On-going   PT LONG TERM GOAL #2   Title Right hip and knee strength= 5/5.   Time 6   Period Weeks   Status On-going   PT LONG TERM GOAL #3   Title Perform ADL's with pain not > 3/10.   Time 6   Period Weeks   Status On-going               Plan - 10/28/15 1227    Clinical Impression Statement Patient tolerated today's treatment fairly well although she continues to have fatigue with exercises. Only complained of anterior pelvis soreness after bridging exercise. Continues to require rest breaks but not as many rest breaks were taken today. Demonstrates decreased R knee extension as SAQ exercise progressed and patient fatigued. Presented with bruise on R side of chin possibly from most recent fall and L 4th digit splint as well as bruise over the superiolateral area of R knee. Normal modaliites response noted following removal of the modaliites. Denied pain following today's treatment.   Pt will benefit from skilled therapeutic intervention in order to improve on the following deficits Pain;Decreased activity tolerance;Increased edema;Decreased strength;Decreased range of motion   Rehab Potential Good   PT Frequency 3x / week   PT Duration 4 weeks   PT Treatment/Interventions ADLs/Self Care Home Management;Electrical Stimulation;Cryotherapy;Therapeutic exercise;Therapeutic activities;Patient/family education;Manual techniques;Vasopneumatic Device   PT Next Visit Plan Continue TKE, SLR, hip flexion, ABD and bridging as tolerated.  Monitor R SIJ pain.   PT Home Exercise Plan QS, SAQ, ext stretch   Consulted and Agree with Plan of Care Patient        Problem List Patient  Active Problem List   Diagnosis Date Noted  . Altered mental status 09/17/2015  . S/P right TKA 08/13/2015  . S/P knee replacement 08/13/2015  . Elevated troponin 03/06/2015  . Fever   . History of diabetes mellitus   . Left buttock pain   . Hyperlipidemia 01/11/2015  . GAD (generalized anxiety disorder) 01/11/2015  . Depression 01/11/2015  . GERD (gastroesophageal reflux disease) 01/11/2015  . Osteopenia 07/13/2014  . Obesity (BMI 30-39.9) 06/19/2014  . Carotid stenosis   . Anemia of chronic disease 12/25/2011  . Iron deficiency anemia 05/27/2011  . ACUTE DIASTOLIC HEART FAILURE 123456  . Type 2 diabetes mellitus with insulin therapy ([place]) 10/08/2009  . DYSLIPIDEMIA 10/08/2009  . Essential hypertension 10/08/2009  . Coronary atherosclerosis 10/08/2009  . CAROTID STENOSIS 10/08/2009    Wynelle Fanny, PTA 10/28/2015, 12:32 PM  [facility] [address] [place], [place], [address] Phone: [phone]   Fax:  [phone]  Name: CRISTAN TASCH MRN: [id number] Date of Birth: 1931/01/10

## 2015-10-29 ENCOUNTER — Emergency Department (HOSPITAL_COMMUNITY)
Admission: EM | Admit: 2015-10-29 | Discharge: 2015-10-30 | Disposition: A | Discharge status: Home / Self Care | Payer: Medicare Other | Attending: Emergency Medicine | Admitting: Emergency Medicine

## 2015-10-29 ENCOUNTER — Emergency Department (HOSPITAL_COMMUNITY): Payer: Medicare Other

## 2015-10-29 ENCOUNTER — Encounter (HOSPITAL_COMMUNITY): Payer: Self-pay | Admitting: *Deleted

## 2015-10-29 DIAGNOSIS — E119 Type 2 diabetes mellitus without complications: Secondary | ICD-10-CM | POA: Diagnosis not present

## 2015-10-29 DIAGNOSIS — M1990 Unspecified osteoarthritis, unspecified site: Secondary | ICD-10-CM | POA: Diagnosis not present

## 2015-10-29 DIAGNOSIS — Z792 Long term (current) use of antibiotics: Secondary | ICD-10-CM | POA: Diagnosis not present

## 2015-10-29 DIAGNOSIS — H9190 Unspecified hearing loss, unspecified ear: Secondary | ICD-10-CM | POA: Insufficient documentation

## 2015-10-29 DIAGNOSIS — I10 Essential (primary) hypertension: Secondary | ICD-10-CM | POA: Insufficient documentation

## 2015-10-29 DIAGNOSIS — D509 Iron deficiency anemia, unspecified: Secondary | ICD-10-CM | POA: Insufficient documentation

## 2015-10-29 DIAGNOSIS — S0083XA Contusion of other part of head, initial encounter: Secondary | ICD-10-CM | POA: Insufficient documentation

## 2015-10-29 DIAGNOSIS — I252 Old myocardial infarction: Secondary | ICD-10-CM | POA: Insufficient documentation

## 2015-10-29 DIAGNOSIS — F0390 Unspecified dementia without behavioral disturbance: Secondary | ICD-10-CM | POA: Diagnosis not present

## 2015-10-29 DIAGNOSIS — Y998 Other external cause status: Secondary | ICD-10-CM | POA: Insufficient documentation

## 2015-10-29 DIAGNOSIS — R220 Localized swelling, mass and lump, head: Secondary | ICD-10-CM | POA: Diagnosis not present

## 2015-10-29 DIAGNOSIS — I2510 Atherosclerotic heart disease of native coronary artery without angina pectoris: Secondary | ICD-10-CM | POA: Diagnosis not present

## 2015-10-29 DIAGNOSIS — F419 Anxiety disorder, unspecified: Secondary | ICD-10-CM | POA: Diagnosis not present

## 2015-10-29 DIAGNOSIS — K219 Gastro-esophageal reflux disease without esophagitis: Secondary | ICD-10-CM | POA: Insufficient documentation

## 2015-10-29 DIAGNOSIS — Z794 Long term (current) use of insulin: Secondary | ICD-10-CM | POA: Insufficient documentation

## 2015-10-29 DIAGNOSIS — W01198A Fall on same level from slipping, tripping and stumbling with subsequent striking against other object, initial encounter: Secondary | ICD-10-CM | POA: Diagnosis not present

## 2015-10-29 DIAGNOSIS — R011 Cardiac murmur, unspecified: Secondary | ICD-10-CM | POA: Diagnosis not present

## 2015-10-29 DIAGNOSIS — Z872 Personal history of diseases of the skin and subcutaneous tissue: Secondary | ICD-10-CM | POA: Insufficient documentation

## 2015-10-29 DIAGNOSIS — J45909 Unspecified asthma, uncomplicated: Secondary | ICD-10-CM | POA: Insufficient documentation

## 2015-10-29 DIAGNOSIS — S79912A Unspecified injury of left hip, initial encounter: Secondary | ICD-10-CM | POA: Diagnosis not present

## 2015-10-29 DIAGNOSIS — F329 Major depressive disorder, single episode, unspecified: Secondary | ICD-10-CM | POA: Diagnosis not present

## 2015-10-29 DIAGNOSIS — Z951 Presence of aortocoronary bypass graft: Secondary | ICD-10-CM | POA: Diagnosis not present

## 2015-10-29 DIAGNOSIS — Y92009 Unspecified place in unspecified non-institutional (private) residence as the place of occurrence of the external cause: Secondary | ICD-10-CM | POA: Diagnosis not present

## 2015-10-29 DIAGNOSIS — M25552 Pain in left hip: Secondary | ICD-10-CM | POA: Diagnosis not present

## 2015-10-29 DIAGNOSIS — Z7902 Long term (current) use of antithrombotics/antiplatelets: Secondary | ICD-10-CM | POA: Diagnosis not present

## 2015-10-29 DIAGNOSIS — Z8701 Personal history of pneumonia (recurrent): Secondary | ICD-10-CM | POA: Diagnosis not present

## 2015-10-29 DIAGNOSIS — Z8673 Personal history of transient ischemic attack (TIA), and cerebral infarction without residual deficits: Secondary | ICD-10-CM | POA: Insufficient documentation

## 2015-10-29 DIAGNOSIS — Z79899 Other long term (current) drug therapy: Secondary | ICD-10-CM | POA: Diagnosis not present

## 2015-10-29 DIAGNOSIS — S0990XA Unspecified injury of head, initial encounter: Secondary | ICD-10-CM | POA: Diagnosis present

## 2015-10-29 DIAGNOSIS — Z87448 Personal history of other diseases of urinary system: Secondary | ICD-10-CM | POA: Diagnosis not present

## 2015-10-29 DIAGNOSIS — R221 Localized swelling, mass and lump, neck: Secondary | ICD-10-CM | POA: Diagnosis not present

## 2015-10-29 DIAGNOSIS — I509 Heart failure, unspecified: Secondary | ICD-10-CM | POA: Insufficient documentation

## 2015-10-29 DIAGNOSIS — Z9861 Coronary angioplasty status: Secondary | ICD-10-CM | POA: Insufficient documentation

## 2015-10-29 DIAGNOSIS — S0093XA Contusion of unspecified part of head, initial encounter: Secondary | ICD-10-CM

## 2015-10-29 DIAGNOSIS — Y9389 Activity, other specified: Secondary | ICD-10-CM | POA: Insufficient documentation

## 2015-10-29 DIAGNOSIS — W19XXXA Unspecified fall, initial encounter: Secondary | ICD-10-CM

## 2015-10-29 NOTE — ED Notes (Addendum)
Family reports pt has vertigo due to allergies. Family reports that pt fell and struck back of head on metal heater.  Denies LOC.  Daughter does report that "she just seems a little off".

## 2015-10-30 NOTE — ED Notes (Signed)
Ambulated patient in room she was able to only take a few steps but able to bare weight on both legs.

## 2015-10-30 NOTE — ED Provider Notes (Signed)
CSN: [id number]     Arrival date & time 10/29/15  2142 History   First MD Initiated Contact with Patient 10/29/15 2305    Chief Complaint  Patient presents with  . Fall     (Consider location/radiation/quality/duration/timing/severity/associated sxs/prior Treatment) HPI patient is here with  daughter who states patient has vertigo and has balance problems. This evening she was going to the bathroom and had to step up to go into the bathroom and she fell backwards and hit the back of her head on a heater. She did not have loss of consciousness. Daughter states that patient had swelling to the back of her head and she became concerned. Patient also states she's having pain in the back of her head and in the back part of her left hip. Daughter states in the fall and in the spring the patient has allergies which makes her balance even worse.  Patient is status post right knee replacement 10 weeks ago.   PCP Dr Alphonse Guild  Past Medical History  Diagnosis Date  . Anemia   . Hypertension   . Iron deficiency anemia 05/27/2011  . CHF (congestive heart failure) ([place])     EF preserved Echo 2012  . 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 Carotid stenosis   . Renal insufficiency   . Furuncle of back, except buttock   . Vertigo     "when sugar gets low"  . CAD (coronary artery disease)   . Chronic kidney infection   . Arthritis     back, knees, and hips  . Myocardial infarction ([place])   . Balance problems   . Speech problem     from stroke  . Vision loss     left eye-"half blind"  . Hearing loss   . Shortness of breath dyspnea     with activity  . H/O seasonal allergies   . Pneumonia     hx of  . Asthma     with allergies  . Swallowing difficulty   . Diabetes mellitus     type 2  . Anxiety   . Depression   . GERD (gastroesophageal reflux disease)   . Dementia     "a little"   Past Surgical  History  Procedure Laterality Date  . I & d of furuncle  April 2013  . Coronary artery bypass graft  2006  . Coronary angioplasty      prior to 2006 5 stents  . Total abdominal hysterectomy  ~81 years old    complete, with tumor removal  . Cholecystectomy    . Appendectomy      with hysterectomy  . Cataract extraction Bilateral 5 years ago  . Total knee arthroplasty Right 08/13/2015    Procedure: RIGHT  TOTAL KNEE ARTHROPLASTY;  Surgeon: Paralee Cancel, MD;  Location: [facility];  Service: Orthopedics;  Laterality: Right;   Family History  Problem Relation Age of Onset  . Cancer Brother     porstate  . Early death Sister   . Heart disease Brother   . Heart disease Brother   . Heart attack Brother   . Heart disease Brother   . Heart attack Brother   . Diabetes Sister   . Heart attack Sister   . Diabetes Sister   . Osteoporosis Sister   . Hypertension Sister    Social History  Substance Use Topics  . Smoking  status: Never Smoker   . Smokeless tobacco: Never Used  . Alcohol Use: No   Daughter lives with patient Uses a walker, but not all the time  OB History    Gravida Para Term Preterm AB TAB SAB Ectopic Multiple Living   4 4 4       3      Review of Systems  All other systems reviewed and are negative.     Allergies  Advil; Heparin; and Propoxyphene n-acetaminophen  Home Medications   Prior to Admission medications   Medication Sig Start Date End Date Taking? Authorizing Provider  amLODipine (NORVASC) 10 MG tablet TAKE (1) TABLET BY MOUTH ONCE DAILY. 09/05/15  Yes Wardell Honour, MD  Artificial Tear Ointment (DRY EYES OP) Apply 1 drop to eye daily as needed. For dry eye/ red eye    Yes Historical Provider, MD  atorvastatin (LIPITOR) 20 MG tablet Take 1 tablet (20 mg total) by mouth at bedtime. 09/20/14  Yes Lysbeth Penner, FNP  cephALEXin (KEFLEX) 500 MG capsule Take 1 capsule (500 mg total) by mouth 4 (four) times daily. 10/17/15  Yes [name], MD    clopidogrel (PLAVIX) 75 MG tablet TAKE (1) TABLET BY MOUTH ONCE DAILY. 10/04/15  Yes Wardell Honour, MD  docusate sodium (COLACE) 100 MG capsule Take 1 capsule (100 mg total) by mouth 2 (two) times daily. 08/15/15  Yes Matthew Babish, PA-C  EASY TOUCH INSULIN SYRINGE 31G X 5/16" 1 ML MISC USE AS DIRECTED. 02/07/15  Yes Chipper Herb, MD  esomeprazole (NEXIUM) 40 MG capsule TAKE 1 CAPSULE BY MOUTH ONCE A DAY. 09/05/15  Yes Wardell Honour, MD  ferrous sulfate 325 (65 FE) MG tablet Take 1 tablet (325 mg total) by mouth 3 (three) times daily after meals. Patient taking differently: Take 325 mg by mouth 2 (two) times daily with a meal.  08/15/15  Yes Danae Orleans, PA-C  furosemide (LASIX) 20 MG tablet Take 1 tablet (20 mg total) by mouth daily as needed for fluid or edema. 09/20/14  Yes Lysbeth Penner, FNP  HYDROcodone-acetaminophen (NORCO) 7.5-325 MG per tablet Take 1-2 tablets by mouth every 4 (four) hours as needed for moderate pain. 08/15/15  Yes Danae Orleans, PA-C  insulin NPH Human (HUMULIN N) 100 UNIT/ML injection Inject 15 units each morning with breakfast Patient taking differently: Inject 25 Units into the skin daily with breakfast.  03/11/15  Yes Florencia Reasons, MD  insulin regular (HUMULIN R) 100 units/mL injection Humulin R sliding scale: Blood sugar 120-150 3units                       151-200 4units                      201-250 7units                     251- 300 11units                     301- 350 15units                      351-400  20units                      > 400 call MD immdediately Patient taking differently: Inject 3-20 Units into the skin 3 (three) times daily as needed (SLIDING SCALE LISTED). Humulin R sliding  scale: Blood sugar 120-150 3units                       151-200 4units                      201-250 7units                     251- 300 11units                     301- 350 15units                      351-400  20units                      > 400 call MD immdediately  03/11/15  Yes Florencia Reasons, MD  isosorbide mononitrate (IMDUR) 30 MG 24 hr tablet Take 1 tablet (30 mg total) by mouth daily. 07/03/15  Yes Wardell Honour, MD  lisinopril (PRINIVIL,ZESTRIL) 20 MG tablet TAKE (1) TABLET BY MOUTH ONCE DAILY. 06/06/15  Yes Wardell Honour, MD  LORazepam (ATIVAN) 0.5 MG tablet TAKE (1) TABLET TWICE DAILY. 10/08/15  Yes Wardell Honour, MD  meclizine (ANTIVERT) 25 MG tablet TAKE 1 TABLET TWICE DAILY AS NEEDED FOR DIZZINESS. Patient taking differently: TAKE 1 TABLET TWICE DAILY FOR DIZZINESS. 08/05/15  Yes Chipper Herb, MD  metFORMIN (GLUCOPHAGE-XR) 500 MG 24 hr tablet TAKE 1 TABLET WITH BREAKFAST AND 1 TABLET WITH SUPPER. 10/07/15  Yes Wardell Honour, MD  metoprolol (LOPRESSOR) 50 MG tablet Take 1 tablet (50 mg total) by mouth 2 (two) times daily. 09/20/14  Yes Lysbeth Penner, FNP  Multiple Vitamins-Iron (DAILY VITAMINS/IRON/BETA CAROT PO) Take 1 tablet by mouth at bedtime.   Yes Historical Provider, MD  nitroGLYCERIN (NITROSTAT) 0.4 MG SL tablet PLACE ONE (1) TABLET UNDER TONGUE EVERY 5 MINUTES UP TO (3) DOSES AS NEEDED FOR CHEST PAIN. 06/06/15  Yes Wardell Honour, MD  NITROSTAT 0.4 MG SL tablet PLACE 1 TABLET UNDER TONGUE EVERY FIVE MINUTES AS NEEDED FOR CHEST PAIN. 09/05/15  Yes Wardell Honour, MD  PARoxetine (PAXIL) 20 MG tablet Take 1 tablet (20 mg total) by mouth daily. 09/20/14  Yes Lysbeth Penner, FNP  polyethylene glycol (MIRALAX / GLYCOLAX) packet Take 17 g by mouth 2 (two) times daily. Patient taking differently: Take 17 g by mouth daily as needed for mild constipation.  08/15/15  Yes Danae Orleans, PA-C  tiZANidine (ZANAFLEX) 4 MG tablet Take 1 tablet (4 mg total) by mouth every 6 (six) hours as needed for muscle spasms. 08/15/15  Yes Danae Orleans, PA-C  traMADol (ULTRAM) 50 MG tablet Take one tablet by mouth every 6 hours as needed for pain. Monitor for increased confusion, sedation, allergic reaction Patient taking differently: Take 50 mg by mouth every 6  (six) hours as needed for moderate pain. Take one tablet by mouth every 6 hours as needed for pain. Monitor for increased confusion, sedation, allergic reaction 08/19/15  Yes Tiffany L Reed, DO   BP 140/54 mmHg  Pulse 57  Temp(Src) 97.8 F (36.6 C) (Oral)  Resp 18  Ht 5\' 6"  (1.676 m)  Wt 187 lb (84.823 kg)  BMI 30.20 kg/m2  SpO2 99%  Vital signs normal except bradycardia  Physical Exam  Constitutional: She is oriented to person, place, and time. She appears well-developed and well-nourished.  Non-toxic appearance. She  does not appear ill. No distress.  HENT:  Head: Normocephalic.    Right Ear: External ear normal.  Left Ear: External ear normal.  Nose: Nose normal. No mucosal edema or rhinorrhea.  Mouth/Throat: Oropharynx is clear and moist and mucous membranes are normal. No dental abscesses or uvula swelling.  Patient has a contusion with swelling of her posterior scalp.  Eyes: Conjunctivae and EOM are normal. Pupils are equal, round, and reactive to light.  Neck: Normal range of motion and full passive range of motion without pain. Neck supple.  Cardiovascular: Normal rate and regular rhythm.  Exam reveals no gallop and no friction rub.   Murmur heard. Harsh systolic murmer heard best over the RUSB  Pulmonary/Chest: Effort normal and breath sounds normal. No respiratory distress. She has no wheezes. She has no rhonchi. She has no rales. She exhibits no tenderness and no crepitus.  Abdominal: Soft. Normal appearance and bowel sounds are normal. She exhibits no distension. There is no tenderness. There is no rebound and no guarding.  Musculoskeletal: Normal range of motion. She exhibits no edema or tenderness.       Legs: Moves all extremities well. Has some pain in the posterior left hip on ROM. (Daughter states she has c/o that pain before her fall).  Neurological: She is alert and oriented to person, place, and time. She has normal strength. No cranial nerve deficit.  Skin:  Skin is warm, dry and intact. No rash noted. No erythema. No pallor.  Psychiatric: She has a normal mood and affect. Her speech is normal and behavior is normal. Her mood appears not anxious.  Nursing note and vitals reviewed.   ED Course  Procedures (including critical care time)  After reviewing patient's x-ray of her hip nursing staff was able to have patient stand and they state she ambulated with assistance and was able to bear weight on both her legs without complaints of pain.   Imaging Review Ct Head Wo Contrast Ct Cervical Spine Wo Contrast  10/29/2015  CLINICAL DATA:  Status post fall. Hit back of head on metal heater. Altered mental status. Vertigo. Concern for cervical spine injury. Initial encounter. EXAM: CT HEAD WITHOUT CONTRAST CT CERVICAL SPINE WITHOUT CONTRAST TECHNIQUE: Multidetector CT imaging of the head and cervical spine was performed following the standard protocol without intravenous contrast. Multiplanar CT image reconstructions of the cervical spine were also generated. COMPARISON:  CT of the head performed 03/06/2015, and CT of the cervical spine performed 08/27/2014 FINDINGS: CT HEAD FINDINGS There is no evidence of acute infarction, mass lesion, or intra- or extra-axial hemorrhage on CT. Prominence of the ventricles and sulci reflects mild to moderate cortical volume loss. Mild cerebellar atrophy is noted. Scattered periventricular and subcortical white matter change likely reflects small vessel ischemic microangiopathy. A chronic infarct is noted at the right occipital lobe, with associated encephalomalacia. A small chronic infarct is also noted at the right cerebellar hemisphere. The brainstem and fourth ventricle are within normal limits. The basal ganglia are unremarkable in appearance. No mass effect or midline shift is seen. There is no evidence of fracture; visualized osseous structures are unremarkable in appearance. The orbits are within normal limits. Mild  mucosal thickening is noted at the left maxillary sinus. The remaining paranasal sinuses and mastoid air cells are well-aerated. Soft tissue swelling is noted at the posterior vertex. CT CERVICAL SPINE FINDINGS There is no evidence of fracture or subluxation. Vertebral bodies demonstrate normal height and alignment. Intervertebral disc spaces are preserved.  Scattered anterior disc osteophyte complexes are seen along the lower cervical and upper thoracic spine. Prevertebral soft tissues are within normal limits. Mild facet disease is noted at the mid cervical spine. The visualized portions of the thyroid gland are unremarkable in appearance. The visualized lung apices are clear. Calcification is noted at the carotid bifurcations bilaterally. IMPRESSION: 1. No evidence of traumatic intracranial injury or fracture. 2. No evidence of fracture or subluxation along the cervical spine. 3. Mild to moderate cortical volume loss and scattered small vessel ischemic microangiopathy. 4. Chronic infarct at the right occipital lobe, with associated encephalomalacia. Small chronic infarct at the right cerebellar hemisphere. 5. Minimal degenerative change along the lower cervical spine. 6. Soft tissue swelling at the posterior vertex. 7. Mild mucosal thickening at the left maxillary sinus. 8. Calcification at the carotid bifurcations bilaterally. Carotid ultrasound could be considered for further evaluation, when and as deemed clinically appropriate. Electronically Signed   By: Garald Balding M.D.   On: 10/29/2015 23:03   Dg Hip Unilat With Pelvis 2-3 Views Left  10/29/2015  CLINICAL DATA:  Posterior left hip pain for 2-3 days. Recent fall. Initial encounter. EXAM: DG HIP (WITH OR WITHOUT PELVIS) 2-3V LEFT COMPARISON:  None. FINDINGS: There is no evidence of fracture or dislocation. Both femoral heads are seated normally within their respective acetabula. The proximal left femur appears intact. Mild degenerative change is noted  at the lower lumbar spine. The sacroiliac joints are unremarkable in appearance. The visualized bowel gas pattern is grossly unremarkable in appearance. Scattered phleboliths are noted within the pelvis. IMPRESSION: No evidence of fracture or dislocation. Electronically Signed   By: Garald Balding M.D.   On: 10/29/2015 23:38   I have personally reviewed and evaluated these images and lab results as part of my medical decision-making.   EKG Interpretation None      MDM   Final diagnoses:  Fall at home, initial encounter  Contusion of head, initial encounter  Left hip pain    Plan discharge  Rolland Porter, MD, Barbette Or, MD 10/30/15 [phone]

## 2015-10-30 NOTE — Discharge Instructions (Signed)
Ice packs to the swollen area on the back of her head. Return to the ED for any problems listed on the head injury sheet. Try giving her the meclizine to see if that will help with her dizziness.    Cryotherapy Cryotherapy is when you put ice on your injury. Ice helps lessen pain and puffiness (swelling) after an injury. Ice works the best when you start using it in the first 24 to 48 hours after an injury. HOME CARE  Put a dry or damp towel between the ice pack and your skin.  You may press gently on the ice pack.  Leave the ice on for no more than 10 to 20 minutes at a time.  Check your skin after 5 minutes to make sure your skin is okay.  Rest at least 20 minutes between ice pack uses.  Stop using ice when your skin loses feeling (numbness).  Do not use ice on someone who cannot tell you when it hurts. This includes small children and people with memory problems (dementia). GET HELP RIGHT AWAY IF:  You have white spots on your skin.  Your skin turns blue or pale.  Your skin feels waxy or hard.  Your puffiness gets worse. MAKE SURE YOU:   Understand these instructions.  Will watch your condition.  Will get help right away if you are not doing well or get worse.   This information is not intended to replace advice given to you by your health care provider. Make sure you discuss any questions you have with your health care provider.   Document Released: 05/11/2008 Document Revised: 02/15/2012 Document Reviewed: 07/16/2011 Elsevier Interactive Patient Education 2016 [place] A contusion is a deep bruise. Contusions happen when an injury causes bleeding under the skin. Symptoms of bruising include pain, swelling, and discolored skin. The skin may turn blue, purple, or yellow. HOME CARE   Rest the injured area.  If told, put ice on the injured area.  Put ice in a plastic bag.  Place a towel between your skin and the bag.  Leave the ice on for 20  minutes, 2-3 times per day.  If told, put light pressure (compression) on the injured area using an elastic bandage. Make sure the bandage is not too tight. Remove it and put it back on as told by your doctor.  If possible, raise (elevate) the injured area above the level of your heart while you are sitting or lying down.  Take over-the-counter and prescription medicines only as told by your doctor. GET HELP IF:  Your symptoms do not get better after several days of treatment.  Your symptoms get worse.  You have trouble moving the injured area. GET HELP RIGHT AWAY IF:   You have very bad pain.  You have a loss of feeling (numbness) in a hand or foot.  Your hand or foot turns pale or cold.   This information is not intended to replace advice given to you by your health care provider. Make sure you discuss any questions you have with your health care provider.   Document Released: 05/11/2008 Document Revised: 08/14/2015 Document Reviewed: 04/10/2015 Elsevier Interactive Patient Education 2016 [place] Injury, Adult You have a head injury. Headaches and throwing up (vomiting) are common after a head injury. It should be easy to wake up from sleeping. Sometimes you must stay in the hospital. Most problems happen within the first 24 hours. Side effects may occur up to  7-10 days after the injury.  WHAT ARE THE TYPES OF HEAD INJURIES? Head injuries can be as minor as a bump. Some head injuries can be more severe. More severe head injuries include:  A jarring injury to the brain (concussion).  A bruise of the brain (contusion). This mean there is bleeding in the brain that can cause swelling.  A cracked skull (skull fracture).  Bleeding in the brain that collects, clots, and forms a bump (hematoma). WHEN SHOULD I GET HELP RIGHT AWAY?   You are confused or sleepy.  You cannot be woken up.  You feel sick to your stomach (nauseous) or keep throwing up (vomiting).  Your  dizziness or unsteadiness is getting worse.  You have very bad, lasting headaches that are not helped by medicine. Take medicines only as told by your doctor.  You cannot use your arms or legs like normal.  You cannot walk.  You notice changes in the black spots in the center of the colored part of your eye (pupil).  You have clear or bloody fluid coming from your nose or ears.  You have trouble seeing. During the next 24 hours after the injury, you must stay with someone who can watch you. This person should get help right away (call 911 in the [place] if you start to shake and are not able to control it (have seizures), you pass out, or you are unable to wake up. HOW CAN I PREVENT A HEAD INJURY IN THE FUTURE?  Wear seat belts.  Wear a helmet while bike riding and playing sports like football.  Stay away from dangerous activities around the house. WHEN CAN I RETURN TO NORMAL ACTIVITIES AND ATHLETICS? See your doctor before doing these activities. You should not do normal activities or play contact sports until 1 week after the following symptoms have stopped:  Headache that does not go away.  Dizziness.  Poor attention.  Confusion.  Memory problems.  Sickness to your stomach or throwing up.  Tiredness.  Fussiness.  Bothered by bright lights or loud noises.  Anxiousness or depression.  Restless sleep. MAKE SURE YOU:   Understand these instructions.  Will watch your condition.  Will get help right away if you are not doing well or get worse.   This information is not intended to replace advice given to you by your health care provider. Make sure you discuss any questions you have with your health care provider.   Document Released: 11/05/2008 Document Revised: 12/14/2014 Document Reviewed: 07/31/2013 Elsevier Interactive Patient Education [employer].

## 2015-11-04 ENCOUNTER — Encounter: Payer: Self-pay | Admitting: Physical Therapy

## 2015-11-04 ENCOUNTER — Ambulatory Visit: Payer: Medicare Other | Admitting: Physical Therapy

## 2015-11-04 DIAGNOSIS — M25561 Pain in right knee: Secondary | ICD-10-CM | POA: Diagnosis not present

## 2015-11-04 DIAGNOSIS — M25661 Stiffness of right knee, not elsewhere classified: Secondary | ICD-10-CM | POA: Diagnosis not present

## 2015-11-04 NOTE — Therapy (Signed)
[facility] [place], [place], [address] Phone: [phone]   Fax:  [phone]  Physical Therapy Treatment  Patient Details  Name: [name] MRN: [id number] Date of Birth: 11/05/1931 Referring Provider: Paralee Cancel MD.  Encounter Date: Oct 23, 202016      PT End of Session - 11/04/15 1036    Visit Number 11   Number of Visits 12   Date for PT Re-Evaluation 11/14/15   PT Start Time 1034   PT Stop Time 1123   PT Time Calculation (min) 49 min   Activity Tolerance Patient tolerated treatment well;Patient limited by pain   Behavior During Therapy [facility] for tasks assessed/performed      Past Medical History  Diagnosis Date  . Anemia   . Hypertension   . Iron deficiency anemia 05/27/2011  . CHF (congestive heart failure) ([place])     EF preserved Echo 2012  . 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 Carotid stenosis   . Renal insufficiency   . Furuncle of back, except buttock   . Vertigo     "when sugar gets low"  . CAD (coronary artery disease)   . Chronic kidney infection   . Arthritis     back, knees, and hips  . Myocardial infarction ([place])   . Balance problems   . Speech problem     from stroke  . Vision loss     left eye-"half blind"  . Hearing loss   . Shortness of breath dyspnea     with activity  . H/O seasonal allergies   . Pneumonia     hx of  . Asthma     with allergies  . Swallowing difficulty   . Diabetes mellitus     type 2  . Anxiety   . Depression   . GERD (gastroesophageal reflux disease)   . Dementia     "a little"    Past Surgical History  Procedure Laterality Date  . I & d of furuncle  April 2013  . Coronary artery bypass graft  2006  . Coronary angioplasty      prior to 2006 5 stents  . Total abdominal hysterectomy  ~81 years old    complete, with tumor removal  . Cholecystectomy    .  Appendectomy      with hysterectomy  . Cataract extraction Bilateral 5 years ago  . Total knee arthroplasty Right 08/13/2015    Procedure: RIGHT  TOTAL KNEE ARTHROPLASTY;  Surgeon: Paralee Cancel, MD;  Location: [facility];  Service: Orthopedics;  Laterality: Right;    There were no vitals filed for this visit.  Visit Diagnosis:  Right knee pain  Knee stiffness, right      Subjective Assessment - 11/04/15 1035    Subjective Reports that R hip is sore today.   Limitations Walking   Patient Stated Goals Get out of pain.   Currently in Pain? Yes   Pain Score 6    Pain Location Hip   Pain Orientation Right;Left;Posterior   Pain Descriptors / Indicators Sore   Pain Frequency Intermittent   Aggravating Factors  Standing, sitting            [facility] PT Assessment - 11/04/15 0001    Assessment   Medical Diagnosis Right total knee replacement.   Onset Date/Surgical Date 08/13/15   Next MD Visit 11/06/2015   ROM /  Strength   AROM / PROM / Strength AROM;Strength   AROM   Overall AROM  Within functional limits for tasks performed   AROM Assessment Site Knee   Right/Left Knee Right   Right Knee Extension 0   Right Knee Flexion 120   Strength   Overall Strength Within functional limits for tasks performed   Strength Assessment Site Hip;Knee   Right/Left Hip Right   Right Hip Flexion 4+/5   Right/Left Knee Right   Right Knee Flexion 4+/5   Right Knee Extension 5/5                     [facility] Adult PT Treatment/Exercise - 11/04/15 0001    Knee/Hip Exercises: Aerobic   Nustep L6 x15 min   Knee/Hip Exercises: Supine   Short Arc Quad Sets Strengthening;Right  x25 reps   Short Arc Quad Sets Limitations 4   Bridges Limitations x20 reps   Modalities   Modalities Print production planner R knee   Printmaker Action IFC   Electrical Stimulation Parameters 1-10 Hz x15 min   Electrical  Stimulation Goals Pain   Vasopneumatic   Number Minutes Vasopneumatic  15 minutes   Vasopnuematic Location  Knee   Vasopneumatic Pressure Medium   Vasopneumatic Temperature  44                  PT Short Term Goals - 10/14/15 1121    PT SHORT TERM GOAL #1   Title Ind with an initial HEP.   Time 3   Period Weeks   Status Achieved   PT SHORT TERM GOAL #2   Title Full active right knee extension.   Time 3   Period Weeks   Status Achieved  R knee ext 0 deg 10/14/2015           PT Long Term Goals - 11/04/15 1114    PT LONG TERM GOAL #1   Title Ind with an advanced HEP.   Time 6   Period Weeks   Status On-going   PT LONG TERM GOAL #2   Title Right hip and knee strength= 5/5.   Time 6   Period Weeks   Status Partially Met   PT LONG TERM GOAL #3   Title Perform ADL's with pain not > 3/10.   Time 6   Period Weeks   Status On-going               Plan - 11/04/15 1107    Clinical Impression Statement Patient tolerated today's treatment fairly well although she continues to report B hip pain. Supine B hip abduction was attempted today but patient reported increased hip pain. Bridging was completed today with facial grimacing although patient was told she could discontinue exercise due to pain but she reported she could handle the exercise. AROM of R knee measured as 0-120 deg in supine. MMT R knee extension measured as 5/5, R knee flexion 4+/5, R hip flexion in sitting 4+/5. Normal modaliites response noted following removal of the modalities. Ambulated into therapy today without R finger splint, and with walker that could fold in. Goals remain on-going secondary to increased pain and R knee and hip strength goal has partially been achieved at this time. Experienced B hip pain as "not as bad" following today's treatment.   Pt will benefit from skilled therapeutic intervention in order to improve on the following deficits Pain;Decreased activity tolerance;Increased  edema;Decreased strength;Decreased range of motion   Rehab Potential Good   PT Frequency 3x / week   PT Duration 4 weeks   PT Treatment/Interventions ADLs/Self Care Home Management;Electrical Stimulation;Cryotherapy;Therapeutic exercise;Therapeutic activities;Patient/family education;Manual techniques;Vasopneumatic Device   PT Next Visit Plan Continue TKE, SLR, hip flexion, ABD and bridging as tolerated. Monitor R SIJ pain.   PT Home Exercise Plan QS, SAQ, ext stretch   Consulted and Agree with Plan of Care Patient        Problem List Patient Active Problem List   Diagnosis Date Noted  . Altered mental status 09/17/2015  . S/P right TKA 08/13/2015  . S/P knee replacement 08/13/2015  . Elevated troponin 03/06/2015  . Fever   . History of diabetes mellitus   . Left buttock pain   . Hyperlipidemia 01/11/2015  . GAD (generalized anxiety disorder) 01/11/2015  . Depression 01/11/2015  . GERD (gastroesophageal reflux disease) 01/11/2015  . Osteopenia 07/13/2014  . Obesity (BMI 30-39.9) 06/19/2014  . Carotid stenosis   . Anemia of chronic disease 12/25/2011  . Iron deficiency anemia 05/27/2011  . ACUTE DIASTOLIC HEART FAILURE 82/50/0370  . Type 2 diabetes mellitus with insulin therapy ([place]) 10/08/2009  . DYSLIPIDEMIA 10/08/2009  . Essential hypertension 10/08/2009  . Coronary atherosclerosis 10/08/2009  . CAROTID STENOSIS 10/08/2009   Ahmed Prima, PTA 12/23/2014 1:46 PM Mali Applegate MPT [facility] [address] [place], [place], [address] Phone: [phone]   Fax:  [phone]  Name: EARNESTINE SHIPP MRN: [id number] Date of Birth: 02-Nov-1931

## 2015-11-05 ENCOUNTER — Ambulatory Visit: Payer: Medicare Other | Admitting: Family Medicine

## 2015-11-06 ENCOUNTER — Other Ambulatory Visit: Payer: Self-pay | Admitting: Family Medicine

## 2015-11-06 DIAGNOSIS — Z96651 Presence of right artificial knee joint: Secondary | ICD-10-CM | POA: Diagnosis not present

## 2015-11-06 DIAGNOSIS — Z471 Aftercare following joint replacement surgery: Secondary | ICD-10-CM | POA: Diagnosis not present

## 2015-11-06 DIAGNOSIS — M545 Low back pain: Secondary | ICD-10-CM | POA: Diagnosis not present

## 2015-11-07 NOTE — Telephone Encounter (Signed)
I sent in 30 days of refills on above but pt needs to be seen for any further refills, has been seen twice in ED for falls in the last month and hasnt seen [place] since prior to a hospital/SNF admission following joint replacement. Also due for diabetes f/u, needs 30 min visit for hospital f/u and DM2 f/u, falls evaluation, medication review. Currently is in PT for joint replacement per chart review.

## 2015-11-07 NOTE — Telephone Encounter (Signed)
Patient has appointment 12/9 with Sabra Heck

## 2015-11-07 NOTE — Telephone Encounter (Signed)
Last seen 07/23/15  Dr Sabra Heck  Last lipid 03/06/15

## 2015-11-08 ENCOUNTER — Ambulatory Visit: Payer: Medicare Other | Attending: Orthopedic Surgery | Admitting: *Deleted

## 2015-11-08 DIAGNOSIS — M25661 Stiffness of right knee, not elsewhere classified: Secondary | ICD-10-CM | POA: Insufficient documentation

## 2015-11-08 DIAGNOSIS — M25561 Pain in right knee: Secondary | ICD-10-CM | POA: Diagnosis not present

## 2015-11-08 NOTE — Therapy (Signed)
[facility] [place], [place], [address] Phone: [phone]   Fax:  [phone]  Physical Therapy Treatment  Patient Details  Name: [name] MRN: [id number] Date of Birth: [birth year] Referring Provider: Paralee Cancel MD.  Encounter Date: 11/08/2015      PT End of Session - 11/08/15 1234    Visit Number 12   Date for PT Re-Evaluation 11/14/15   PT Start Time 1030   PT Stop Time 1121   PT Time Calculation (min) 51 min   Activity Tolerance Patient tolerated treatment well;Patient limited by pain   Behavior During Therapy [facility] for tasks assessed/performed      Past Medical History  Diagnosis Date  . Anemia   . Hypertension   . Iron deficiency anemia 05/27/2011  . CHF (congestive heart failure) ([place])     EF preserved Echo 2012  . 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 Carotid stenosis   . Renal insufficiency   . Furuncle of back, except buttock   . Vertigo     "when sugar gets low"  . CAD (coronary artery disease)   . Chronic kidney infection   . Arthritis     back, knees, and hips  . Myocardial infarction ([place])   . Balance problems   . Speech problem     from stroke  . Vision loss     left eye-"half blind"  . Hearing loss   . Shortness of breath dyspnea     with activity  . H/O seasonal allergies   . Pneumonia     hx of  . Asthma     with allergies  . Swallowing difficulty   . Diabetes mellitus     type 2  . Anxiety   . Depression   . GERD (gastroesophageal reflux disease)   . Dementia     "a little"    Past Surgical History  Procedure Laterality Date  . I & d of furuncle  April 2013  . Coronary artery bypass graft  2006  . Coronary angioplasty      prior to 2006 5 stents  . Total abdominal hysterectomy  ~81 years old    complete, with tumor removal  . Cholecystectomy    . Appendectomy      with  hysterectomy  . Cataract extraction Bilateral 5 years ago  . Total knee arthroplasty Right 08/13/2015    Procedure: RIGHT  TOTAL KNEE ARTHROPLASTY;  Surgeon: Paralee Cancel, MD;  [facility];  Service: Orthopedics;  Laterality: Right;    There were no vitals filed for this visit.  Visit Diagnosis:  Right knee pain  Knee stiffness, right                       [facility] Adult PT Treatment/Exercise - 11/08/15 0001    Knee/Hip Exercises: Aerobic   Nustep L5 x18 min  Monitor Drifting of RT hip IR on Nustep. Cues to correct   Knee/Hip Exercises: Standing   Forward Step Up 10 reps;20 reps;Step Height: 4";Right  3x10 with Bil UE assist   Knee/Hip Exercises: Seated   Long Arc Quad Strengthening;Right;3 sets;10 reps;Weights   Long Arc Quad Weight 4 lbs.   Knee/Hip Exercises: Supine   Short Arc [employer] --   Short Arc [employer] Limitations --   Bridges Limitations x20 reps   Other Supine Knee/Hip  Exercises Clam shell in Hooklying with red band   Modalities   Modalities Electrical Stimulation;Vasopneumatic   Electrical Stimulation   Electrical Stimulation Location R knee IFC 1-10hz  x 15 mins   Electrical Stimulation Goals Pain   Vasopneumatic   Number Minutes Vasopneumatic  15 minutes   Vasopnuematic Location  Knee   Vasopneumatic Pressure Medium   Vasopneumatic Temperature  44                  PT Short Term Goals - 10/14/15 1121    PT SHORT TERM GOAL #1   Title Ind with an initial HEP.   Time 3   Period Weeks   Status Achieved   PT SHORT TERM GOAL #2   Title Full active right knee extension.   Time 3   Period Weeks   Status Achieved  R knee ext 0 deg 10/14/2015           PT Long Term Goals - 11/04/15 1114    PT LONG TERM GOAL #1   Title Ind with an advanced HEP.   Time 6   Period Weeks   Status On-going   PT LONG TERM GOAL #2   Title Right hip and knee strength= 5/5.   Time 6   Period Weeks   Status Partially Met   PT LONG TERM GOAL  #3   Title Perform ADL's with pain not > 3/10.   Time 6   Period Weeks   Status On-going               Plan - 11/08/15 1237    Clinical Impression Statement Pt did fairly well with todays Rx and was able to complete all exs with mimal pain in RT knee. Pt lets RT hip IR when she is on the Nustep and needs cues to correct alignment. She did have some pain in her RT hip during exs, but subsided by the end of the Rx. Strength LTG is ongoing    Pt will benefit from skilled therapeutic intervention in order to improve on the following deficits Pain;Decreased activity tolerance;Increased edema;Decreased strength;Decreased range of motion   PT Frequency 3x / week   PT Duration 4 weeks   PT Treatment/Interventions ADLs/Self Care Home Management;Electrical Stimulation;Cryotherapy;Therapeutic exercise;Therapeutic activities;Patient/family education;Manual techniques;Vasopneumatic Device   PT Next Visit Plan Continue TKE, SLR, hip flexion, ABD and bridging as tolerated. Monitor R SIJ pain. and RT hip control      MC RECERT   PT Home Exercise Plan QS, SAQ, ext stretch   Consulted and Agree with Plan of Care Patient        Problem List Patient Active Problem List   Diagnosis Date Noted  . Altered mental status 09/17/2015  . S/P right TKA 08/13/2015  . S/P knee replacement 08/13/2015  . Elevated troponin 03/06/2015  . Fever   . History of diabetes mellitus   . Left buttock pain   . Hyperlipidemia 01/11/2015  . GAD (generalized anxiety disorder) 01/11/2015  . Depression 01/11/2015  . GERD (gastroesophageal reflux disease) 01/11/2015  . Osteopenia 07/13/2014  . Obesity (BMI 30-39.9) 06/19/2014  . Carotid stenosis   . Anemia of chronic disease 12/25/2011  . Iron deficiency anemia 05/27/2011  . ACUTE DIASTOLIC HEART FAILURE 60/09/9322  . Type 2 diabetes mellitus with insulin therapy ([place]) 10/08/2009  . DYSLIPIDEMIA 10/08/2009  . Essential hypertension 10/08/2009  . Coronary  atherosclerosis 10/08/2009  . CAROTID STENOSIS 10/08/2009    Jelan Batterton,CHRIS, PTA 11/08/2015, 12:58 PM  [facility] [place], [place], [address] Phone: [phone]   Fax:  [phone]  Name: [name] MRN: [id number] Date of Birth: [birth year]

## 2015-11-11 ENCOUNTER — Encounter: Payer: Self-pay | Admitting: Physical Therapy

## 2015-11-11 ENCOUNTER — Ambulatory Visit: Payer: Medicare Other | Admitting: Physical Therapy

## 2015-11-11 DIAGNOSIS — M25561 Pain in right knee: Secondary | ICD-10-CM | POA: Diagnosis not present

## 2015-11-11 DIAGNOSIS — M25661 Stiffness of right knee, not elsewhere classified: Secondary | ICD-10-CM

## 2015-11-11 NOTE — Therapy (Signed)
[facility] [place], [place], [address] Phone: [phone]   Fax:  [phone]  Physical Therapy Treatment  Patient Details  Name: [name] MRN: [id number] Date of Birth: 10/30/1931 Referring Provider: Paralee Cancel MD.  Encounter Date: 11/11/2015      PT End of Session - 11/11/15 0958    Visit Number 13   Number of Visits 12   Date for PT Re-Evaluation 11/14/15   PT Start Time 0950   PT Stop Time 1040   PT Time Calculation (min) 50 min   Activity Tolerance Patient tolerated treatment well   Behavior During Therapy [facility] for tasks assessed/performed      Past Medical History  Diagnosis Date  . Anemia   . Hypertension   . Iron deficiency anemia 05/27/2011  . CHF (congestive heart failure) ([place])     EF preserved Echo 2012  . 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 Carotid stenosis   . Renal insufficiency   . Furuncle of back, except buttock   . Vertigo     "when sugar gets low"  . CAD (coronary artery disease)   . Chronic kidney infection   . Arthritis     back, knees, and hips  . Myocardial infarction ([place])   . Balance problems   . Speech problem     from stroke  . Vision loss     left eye-"half blind"  . Hearing loss   . Shortness of breath dyspnea     with activity  . H/O seasonal allergies   . Pneumonia     hx of  . Asthma     with allergies  . Swallowing difficulty   . Diabetes mellitus     type 2  . Anxiety   . Depression   . GERD (gastroesophageal reflux disease)   . Dementia     "a little"    Past Surgical History  Procedure Laterality Date  . I & d of furuncle  April 2013  . Coronary artery bypass graft  2006  . Coronary angioplasty      prior to 2006 5 stents  . Total abdominal hysterectomy  ~81 years old    complete, with tumor removal  . Cholecystectomy    . Appendectomy      with  hysterectomy  . Cataract extraction Bilateral 5 years ago  . Total knee arthroplasty Right 08/13/2015    Procedure: RIGHT  TOTAL KNEE ARTHROPLASTY;  Surgeon: Paralee Cancel, MD;  Location: [facility];  Service: Orthopedics;  Laterality: Right;    There were no vitals filed for this visit.  Visit Diagnosis:  Right knee pain  Knee stiffness, right      Subjective Assessment - 11/11/15 0957    Subjective Reports R hip is sore today and states that she hurt a lot yesterday. Stated that MD said that hip pain was due to arthritis.   Limitations Walking   Patient Stated Goals Get out of pain.   Currently in Pain? Yes   Pain Score 6    Pain Location Hip   Pain Orientation Right;Posterior   Pain Descriptors / Indicators Sore   Pain Type Acute pain            [facility] PT Assessment - 11/11/15 0001    Assessment   Medical Diagnosis Right total knee replacement.   Onset Date/Surgical Date 08/13/15  [place] Adult PT Treatment/Exercise - 11/11/15 0001    Knee/Hip Exercises: Aerobic   Nustep L6 x15 min   Knee/Hip Exercises: Standing   Hip Extension Stengthening;Right;2 sets;10 reps;Knee straight   Extension Limitations 4#   Other Standing Knee Exercises R HS curl 4# 1x15 reps   Knee/Hip Exercises: Seated   Long Arc Quad Strengthening;Right;3 sets;10 reps;Weights   Long Arc Quad Weight 4 lbs.   Knee/Hip Exercises: Supine   Straight Leg Raises Strengthening;Right;2 sets;10 reps   Other Supine Knee/Hip Exercises Clam shell in Hooklying with red band x20 reps   Modalities   Modalities Electrical Stimulation;Vasopneumatic   Electrical Stimulation   Electrical Stimulation Location R knee    Electrical Stimulation Action IFC   Electrical Stimulation Parameters 1-10 Hz x15 min   Electrical Stimulation Goals Pain   Vasopneumatic   Number Minutes Vasopneumatic  15 minutes   Vasopnuematic Location  Knee   Vasopneumatic Pressure Medium   Vasopneumatic Temperature   57                  PT Short Term Goals - 10/14/15 1121    PT SHORT TERM GOAL #1   Title Ind with an initial HEP.   Time 3   Period Weeks   Status Achieved   PT SHORT TERM GOAL #2   Title Full active right knee extension.   Time 3   Period Weeks   Status Achieved  R knee ext 0 deg 10/14/2015           PT Long Term Goals - 11/04/15 1114    PT LONG TERM GOAL #1   Title Ind with an advanced HEP.   Time 6   Period Weeks   Status On-going   PT LONG TERM GOAL #2   Title Right hip and knee strength= 5/5.   Time 6   Period Weeks   Status Partially Met   PT LONG TERM GOAL #3   Title Perform ADL's with pain not > 3/10.   Time 6   Period Weeks   Status On-going               Plan - 11/11/15 1026    Clinical Impression Statement Patient tolerated today's treatment fairly well with focus on R hip and knee strengthening. Required increased multimodal cueing today for SLR, hip extension and HS curls for proper exercise technique and corrections. Did not verbalize any hip pain during any exercises although by the end of the exercises she reports inferior R knee pain. Demonstrated R knee extensor lag during R SLR today and demostrated fatigue with exercises. Normal modalities response noted following removal of the modallities. Experienced hip tightness per patient report following today's treatment.   Pt will benefit from skilled therapeutic intervention in order to improve on the following deficits Pain;Decreased activity tolerance;Increased edema;Decreased strength;Decreased range of motion   Rehab Potential Good   PT Frequency 3x / week   PT Duration 4 weeks   PT Treatment/Interventions ADLs/Self Care Home Management;Electrical Stimulation;Cryotherapy;Therapeutic exercise;Therapeutic activities;Patient/family education;Manual techniques;Vasopneumatic Device   PT Next Visit Plan Continue TKE, SLR, hip flexion, ABD and bridging as tolerated. Monitor R SIJ pain. and RT  hip control        PT Home Exercise Plan QS, SAQ, ext stretch   Consulted and Agree with Plan of Care Patient        Problem List Patient Active Problem List   Diagnosis Date Noted  . Altered mental status 09/17/2015  .  S/P right TKA 08/13/2015  . S/P knee replacement 08/13/2015  . Elevated troponin 03/06/2015  . Fever   . History of diabetes mellitus   . Left buttock pain   . Hyperlipidemia 01/11/2015  . GAD (generalized anxiety disorder) 01/11/2015  . Depression 01/11/2015  . GERD (gastroesophageal reflux disease) 01/11/2015  . Osteopenia 07/13/2014  . Obesity (BMI 30-39.9) 06/19/2014  . Carotid stenosis   . Anemia of chronic disease 12/25/2011  . Iron deficiency anemia 05/27/2011  . ACUTE DIASTOLIC HEART FAILURE 09/79/4997  . Type 2 diabetes mellitus with insulin therapy ([place]) 10/08/2009  . DYSLIPIDEMIA 10/08/2009  . Essential hypertension 10/08/2009  . Coronary atherosclerosis 10/08/2009  . CAROTID STENOSIS 10/08/2009    Wynelle Fanny, PTA 11/11/2015, 11:29 AM  [facility] [address] [place], [place], [address] Phone: [phone]   Fax:  [phone]  Name: [name] MRN: [id number] Date of Birth: 1931-07-06

## 2015-11-12 ENCOUNTER — Encounter (HOSPITAL_COMMUNITY): Payer: Self-pay

## 2015-11-12 ENCOUNTER — Observation Stay (HOSPITAL_COMMUNITY)
Admission: EM | Admit: 2015-11-12 | Discharge: 2015-11-14 | Disposition: A | Discharge status: Home / Self Care | Payer: Medicare Other | Attending: Internal Medicine | Admitting: Internal Medicine

## 2015-11-12 DIAGNOSIS — D638 Anemia in other chronic diseases classified elsewhere: Secondary | ICD-10-CM | POA: Diagnosis not present

## 2015-11-12 DIAGNOSIS — G9341 Metabolic encephalopathy: Secondary | ICD-10-CM | POA: Diagnosis present

## 2015-11-12 DIAGNOSIS — J45909 Unspecified asthma, uncomplicated: Secondary | ICD-10-CM | POA: Insufficient documentation

## 2015-11-12 DIAGNOSIS — N390 Urinary tract infection, site not specified: Secondary | ICD-10-CM | POA: Diagnosis not present

## 2015-11-12 DIAGNOSIS — I2510 Atherosclerotic heart disease of native coronary artery without angina pectoris: Secondary | ICD-10-CM | POA: Diagnosis not present

## 2015-11-12 DIAGNOSIS — I6529 Occlusion and stenosis of unspecified carotid artery: Secondary | ICD-10-CM | POA: Diagnosis present

## 2015-11-12 DIAGNOSIS — Z8673 Personal history of transient ischemic attack (TIA), and cerebral infarction without residual deficits: Secondary | ICD-10-CM | POA: Diagnosis not present

## 2015-11-12 DIAGNOSIS — I252 Old myocardial infarction: Secondary | ICD-10-CM | POA: Insufficient documentation

## 2015-11-12 DIAGNOSIS — H547 Unspecified visual loss: Secondary | ICD-10-CM | POA: Insufficient documentation

## 2015-11-12 DIAGNOSIS — D509 Iron deficiency anemia, unspecified: Secondary | ICD-10-CM | POA: Diagnosis not present

## 2015-11-12 DIAGNOSIS — Z794 Long term (current) use of insulin: Secondary | ICD-10-CM | POA: Insufficient documentation

## 2015-11-12 DIAGNOSIS — K219 Gastro-esophageal reflux disease without esophagitis: Secondary | ICD-10-CM | POA: Diagnosis not present

## 2015-11-12 DIAGNOSIS — E119 Type 2 diabetes mellitus without complications: Secondary | ICD-10-CM | POA: Diagnosis not present

## 2015-11-12 DIAGNOSIS — Z7984 Long term (current) use of oral hypoglycemic drugs: Secondary | ICD-10-CM | POA: Diagnosis not present

## 2015-11-12 DIAGNOSIS — I10 Essential (primary) hypertension: Secondary | ICD-10-CM | POA: Diagnosis not present

## 2015-11-12 DIAGNOSIS — Z8701 Personal history of pneumonia (recurrent): Secondary | ICD-10-CM | POA: Insufficient documentation

## 2015-11-12 DIAGNOSIS — F411 Generalized anxiety disorder: Secondary | ICD-10-CM | POA: Diagnosis present

## 2015-11-12 DIAGNOSIS — D649 Anemia, unspecified: Secondary | ICD-10-CM | POA: Diagnosis not present

## 2015-11-12 DIAGNOSIS — F419 Anxiety disorder, unspecified: Secondary | ICD-10-CM | POA: Diagnosis not present

## 2015-11-12 DIAGNOSIS — F0390 Unspecified dementia without behavioral disturbance: Secondary | ICD-10-CM | POA: Diagnosis not present

## 2015-11-12 DIAGNOSIS — Z872 Personal history of diseases of the skin and subcutaneous tissue: Secondary | ICD-10-CM | POA: Insufficient documentation

## 2015-11-12 DIAGNOSIS — F329 Major depressive disorder, single episode, unspecified: Secondary | ICD-10-CM | POA: Insufficient documentation

## 2015-11-12 DIAGNOSIS — N1832 Chronic kidney disease, stage 3b: Secondary | ICD-10-CM | POA: Diagnosis present

## 2015-11-12 DIAGNOSIS — I509 Heart failure, unspecified: Secondary | ICD-10-CM | POA: Insufficient documentation

## 2015-11-12 DIAGNOSIS — M1990 Unspecified osteoarthritis, unspecified site: Secondary | ICD-10-CM | POA: Diagnosis not present

## 2015-11-12 DIAGNOSIS — Z79899 Other long term (current) drug therapy: Secondary | ICD-10-CM | POA: Diagnosis not present

## 2015-11-12 DIAGNOSIS — H9190 Unspecified hearing loss, unspecified ear: Secondary | ICD-10-CM | POA: Diagnosis not present

## 2015-11-12 DIAGNOSIS — I5032 Chronic diastolic (congestive) heart failure: Secondary | ICD-10-CM | POA: Diagnosis not present

## 2015-11-12 DIAGNOSIS — E1100 Type 2 diabetes mellitus with hyperosmolarity without nonketotic hyperglycemic-hyperosmolar coma (NKHHC): Secondary | ICD-10-CM

## 2015-11-12 DIAGNOSIS — E1165 Type 2 diabetes mellitus with hyperglycemia: Secondary | ICD-10-CM

## 2015-11-12 DIAGNOSIS — E1122 Type 2 diabetes mellitus with diabetic chronic kidney disease: Secondary | ICD-10-CM | POA: Diagnosis present

## 2015-11-12 DIAGNOSIS — R739 Hyperglycemia, unspecified: Secondary | ICD-10-CM | POA: Diagnosis present

## 2015-11-12 LAB — HEPATIC FUNCTION PANEL
ALBUMIN: 3.6 g/dL (ref 3.5–5.0)
ALK PHOS: 90 U/L (ref 38–126)
ALT: 14 U/L (ref 14–54)
AST: 10 U/L — AB (ref 15–41)
Bilirubin, Direct: 0.1 mg/dL (ref 0.1–0.5)
Indirect Bilirubin: 0.6 mg/dL (ref 0.3–0.9)
TOTAL PROTEIN: 6.8 g/dL (ref 6.5–8.1)
Total Bilirubin: 0.7 mg/dL (ref 0.3–1.2)

## 2015-11-12 LAB — CBC
HEMATOCRIT: 34.2 % — AB (ref 36.0–46.0)
Hemoglobin: 11.5 g/dL — ABNORMAL LOW (ref 12.0–15.0)
MCH: 28.3 pg (ref 26.0–34.0)
MCHC: 33.6 g/dL (ref 30.0–36.0)
MCV: 84 fL (ref 78.0–100.0)
PLATELETS: 206 10*3/uL (ref 150–400)
RBC: 4.07 MIL/uL (ref 3.87–5.11)
RDW: 14.2 % (ref 11.5–15.5)
WBC: 8.4 10*3/uL (ref 4.0–10.5)

## 2015-11-12 LAB — LACTIC ACID, PLASMA: LACTIC ACID, VENOUS: 1 mmol/L (ref 0.5–2.0)

## 2015-11-12 LAB — URINALYSIS, ROUTINE W REFLEX MICROSCOPIC
Bilirubin Urine: NEGATIVE
Ketones, ur: NEGATIVE mg/dL
NITRITE: POSITIVE — AB
PH: 5.5 (ref 5.0–8.0)
Specific Gravity, Urine: 1.015 (ref 1.005–1.030)

## 2015-11-12 LAB — BASIC METABOLIC PANEL
Anion gap: 6 (ref 5–15)
BUN: 32 mg/dL — ABNORMAL HIGH (ref 6–20)
CHLORIDE: 97 mmol/L — AB (ref 101–111)
CO2: 26 mmol/L (ref 22–32)
CREATININE: 1.39 mg/dL — AB (ref 0.44–1.00)
Calcium: 9.6 mg/dL (ref 8.9–10.3)
GFR calc Af Amer: 40 mL/min — ABNORMAL LOW (ref >60)
GFR calc non Af Amer: 34 mL/min — ABNORMAL LOW (ref >60)
Glucose, Bld: 561 mg/dL (ref 65–99)
Potassium: 5.3 mmol/L — ABNORMAL HIGH (ref 3.5–5.1)
Sodium: 129 mmol/L — ABNORMAL LOW (ref 135–145)

## 2015-11-12 LAB — URINE MICROSCOPIC-ADD ON

## 2015-11-12 LAB — CBG MONITORING, ED
GLUCOSE-CAPILLARY: 431 mg/dL — AB (ref 65–99)
Glucose-Capillary: 536 mg/dL — ABNORMAL HIGH (ref 65–99)
Glucose-Capillary: 376 mg/dL — ABNORMAL HIGH (ref 65–99)

## 2015-11-12 LAB — GLUCOSE, CAPILLARY: Glucose-Capillary: 292 mg/dL — ABNORMAL HIGH (ref 65–99)

## 2015-11-12 MED ORDER — ONDANSETRON HCL 4 MG PO TABS: 4.0000 mg | ORAL_TABLET | Freq: Four times a day (QID) | ORAL | Status: DC | PRN

## 2015-11-12 MED ORDER — INSULIN ASPART 100 UNIT/ML ~~LOC~~ SOLN
0.0000 [IU] | Freq: Three times a day (TID) | SUBCUTANEOUS | Status: DC
  Administered 2015-11-13: 5 [IU] via SUBCUTANEOUS
  Administered 2015-11-13: 3 [IU] via SUBCUTANEOUS

## 2015-11-12 MED ORDER — INSULIN ASPART 100 UNIT/ML ~~LOC~~ SOLN: 510.0000 [IU] | Freq: Once | SUBCUTANEOUS | Status: DC

## 2015-11-12 MED ORDER — INSULIN ASPART 100 UNIT/ML ~~LOC~~ SOLN
0.0000 [IU] | Freq: Every day | SUBCUTANEOUS | Status: DC
  Administered 2015-11-12 – 2015-11-13 (×2): 3 [IU] via SUBCUTANEOUS

## 2015-11-12 MED ORDER — DEXTROSE 5 % IV SOLN
1.0000 g | INTRAVENOUS | Status: DC
  Administered 2015-11-13 – 2015-11-14 (×2): 1 g via INTRAVENOUS
  Filled 2015-11-12 (×2): qty 10

## 2015-11-12 MED ORDER — INSULIN ASPART 100 UNIT/ML ~~LOC~~ SOLN
10.0000 [IU] | Freq: Once | SUBCUTANEOUS | Status: AC
  Administered 2015-11-12: 10 [IU] via SUBCUTANEOUS
  Filled 2015-11-12: qty 1

## 2015-11-12 MED ORDER — ONDANSETRON HCL 4 MG/2ML IJ SOLN: 4.0000 mg | Freq: Four times a day (QID) | INTRAMUSCULAR | Status: DC | PRN

## 2015-11-12 MED ORDER — DEXTROSE 5 % IV SOLN
1.0000 g | Freq: Once | INTRAVENOUS | Status: AC
  Administered 2015-11-12: 1 g via INTRAVENOUS
  Filled 2015-11-12: qty 10

## 2015-11-12 MED ORDER — SODIUM CHLORIDE 0.9 % IV BOLUS (SEPSIS)
1000.0000 mL | Freq: Once | INTRAVENOUS | Status: AC
  Administered 2015-11-12: 1000 mL via INTRAVENOUS

## 2015-11-12 MED ORDER — SODIUM CHLORIDE 0.9 % IV SOLN
INTRAVENOUS | Status: DC
  Administered 2015-11-12: via INTRAVENOUS

## 2015-11-12 NOTE — ED Notes (Signed)
Her blood sugar has been high this afternoon, so high that it would not read on the meter. She has not been acting right. Her urine smells strong. She has been constipated for the past 5 days and has been taking medications for the constipation. She had a stool all the way down the hall and into the bathroom around the commode.

## 2015-11-12 NOTE — H&P (Signed)
PCP:   Evelina Dun, FNP   Chief Complaint:  [name], ams  HPI: 81 yo female h/o dementia, chf, htn, IDDM brought in by daughter with whom she lives for generalized weakness, glucose high unreadable at home, dysuria for several days.  More confused than her usual state.  All history obtained from ed staff. Pt pleasantly confused.  Says she feels much better since getting here.  No family at bedside.  Review of Systems:  Unobtainable from patient due to ams  Past Medical History: Past Medical History  Diagnosis Date  . Anemia   . Hypertension   . Iron deficiency anemia 05/27/2011  . CHF (congestive heart failure) ([place])     EF preserved Echo 2012  . 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 Carotid stenosis   . Renal insufficiency   . Furuncle of back, except buttock   . Vertigo     "when sugar gets low"  . CAD (coronary artery disease)   . Chronic kidney infection   . Arthritis     back, knees, and hips  . Myocardial infarction ([place])   . Balance problems   . Speech problem     from stroke  . Vision loss     left eye-"half blind"  . Hearing loss   . Shortness of breath dyspnea     with activity  . H/O seasonal allergies   . Pneumonia     hx of  . Asthma     with allergies  . Swallowing difficulty   . Diabetes mellitus     type 2  . Anxiety   . Depression   . GERD (gastroesophageal reflux disease)   . Dementia     "a little"   Past Surgical History  Procedure Laterality Date  . I & d of furuncle  April 2013  . Coronary artery bypass graft  2006  . Coronary angioplasty      prior to 2006 5 stents  . Total abdominal hysterectomy  ~81 years old    complete, with tumor removal  . Cholecystectomy    . Appendectomy      with hysterectomy  . Cataract extraction Bilateral 5 years ago  . Total knee arthroplasty Right 08/13/2015    Procedure: RIGHT  TOTAL KNEE ARTHROPLASTY;  Surgeon:  Paralee Cancel, MD;  Location: [facility];  Service: Orthopedics;  Laterality: Right;    Medications: Prior to Admission medications   Medication Sig Start Date End Date Taking? Authorizing Provider  amLODipine (NORVASC) 10 MG tablet TAKE (1) TABLET BY MOUTH ONCE DAILY. 11/07/15  Yes Eustaquio Maize, MD  Artificial Tear Ointment (DRY EYES OP) Apply 1 drop to eye daily as needed. For dry eye/ red eye    Yes Historical Provider, MD  atorvastatin (LIPITOR) 20 MG tablet TAKE (1) TABLET DAILY AT BEDTIME. 11/07/15  Yes Eustaquio Maize, MD  clopidogrel (PLAVIX) 75 MG tablet TAKE (1) TABLET BY MOUTH ONCE DAILY. 10/04/15  Yes Wardell Honour, MD  docusate sodium (COLACE) 100 MG capsule Take 1 capsule (100 mg total) by mouth 2 (two) times daily. 08/15/15  Yes Matthew Babish, PA-C  esomeprazole (NEXIUM) 40 MG capsule TAKE 1 CAPSULE BY MOUTH ONCE A DAY. Patient taking differently: TAKE 1 CAPSULE BY MOUTH ONCE A DAILY AS NEEDED FOR ACID REFLUX 09/05/15  Yes Wardell Honour, MD  ferrous sulfate 325 (65 FE) MG tablet Take  1 tablet (325 mg total) by mouth 3 (three) times daily after meals. Patient taking differently: Take 325 mg by mouth 2 (two) times daily with a meal.  08/15/15  Yes Danae Orleans, PA-C  furosemide (LASIX) 20 MG tablet Take 1 tablet (20 mg total) by mouth daily as needed for fluid or edema. 09/20/14  Yes Lysbeth Penner, FNP  HYDROcodone-acetaminophen (NORCO) 7.5-325 MG per tablet Take 1-2 tablets by mouth every 4 (four) hours as needed for moderate pain. 08/15/15  Yes Matthew Babish, PA-C  insulin lispro (HUMALOG KWIKPEN) 100 UNIT/ML KiwkPen Inject 3-20 Units into the skin 3 (three) times daily as needed. sliding scale: Blood sugar 120-150 3units                       151-200 4units                      201-250 7units                     251- 300 11units                     301- 350 15units                      351-400  20units                      > 400 call MD immdediately   Yes Historical  Provider, MD  insulin NPH Human (HUMULIN N) 100 UNIT/ML injection Inject 15 units each morning with breakfast Patient taking differently: Inject 40 Units into the skin daily with breakfast.  03/11/15  Yes Florencia Reasons, MD  isosorbide mononitrate (IMDUR) 30 MG 24 hr tablet Take 1 tablet (30 mg total) by mouth daily. 07/03/15  Yes Wardell Honour, MD  lisinopril (PRINIVIL,ZESTRIL) 20 MG tablet TAKE (1) TABLET BY MOUTH ONCE DAILY. 06/06/15  Yes Wardell Honour, MD  LORazepam (ATIVAN) 0.5 MG tablet TAKE (1) TABLET TWICE DAILY. Patient taking differently: Take 0.5 mg by mouth 2 (two) times daily.  10/08/15  Yes Wardell Honour, MD  meclizine (ANTIVERT) 25 MG tablet TAKE 1 TABLET TWICE DAILY AS NEEDED FOR DIZZINESS. Patient taking differently: TAKE 1 TABLET TWICE DAILY FOR DIZZINESS. 08/05/15  Yes Chipper Herb, MD  metFORMIN (GLUCOPHAGE-XR) 500 MG 24 hr tablet TAKE 1 TABLET WITH BREAKFAST AND 1 TABLET WITH SUPPER. 11/07/15  Yes Wardell Honour, MD  metoprolol (LOPRESSOR) 50 MG tablet TAKE (1) TABLET TWICE DAILY. 11/07/15  Yes Eustaquio Maize, MD  Multiple Vitamins-Iron (DAILY VITAMINS/IRON/BETA CAROT PO) Take 1 tablet by mouth at bedtime.   Yes Historical Provider, MD  nitroGLYCERIN (NITROSTAT) 0.4 MG SL tablet PLACE ONE (1) TABLET UNDER TONGUE EVERY 5 MINUTES UP TO (3) DOSES AS NEEDED FOR CHEST PAIN. 06/06/15  Yes Wardell Honour, MD  PARoxetine (PAXIL) 20 MG tablet TAKE (1) TABLET BY MOUTH ONCE DAILY. 11/07/15  Yes Eustaquio Maize, MD  polyethylene glycol [facility] / GLYCOLAX) packet Take 17 g by mouth 2 (two) times daily. Patient taking differently: Take 17 g by mouth daily as needed for mild constipation.  08/15/15  Yes Danae Orleans, PA-C  traMADol (ULTRAM) 50 MG tablet Take one tablet by mouth every 6 hours as needed for pain. Monitor for increased confusion, sedation, allergic reaction Patient taking differently: Take 50 mg by mouth every 6 (six) hours as needed  for moderate pain. Take one tablet by mouth  every 6 hours as needed for pain. Monitor for increased confusion, sedation, allergic reaction 08/19/15  Yes Tiffany L Reed, DO  cephALEXin (KEFLEX) 500 MG capsule Take 1 capsule (500 mg total) by mouth 4 (four) times daily. Patient not taking: Reported on 11/12/2015 10/17/15   [name], MD  EASY [facility] INSULIN SYRINGE 31G X 5/16" 1 ML MISC USE AS DIRECTED. 02/07/15   Chipper Herb, MD  tiZANidine (ZANAFLEX) 4 MG tablet Take 1 tablet (4 mg total) by mouth every 6 (six) hours as needed for muscle spasms. Patient not taking: Reported on 11/12/2015 08/15/15   Danae Orleans, PA-C    Allergies:   Allergies  Allergen Reactions  . Advil [Ibuprofen] Swelling  . Heparin Other (See Comments)    Confusion  . Propoxyphene N-Acetaminophen Other (See Comments)    Numbness all over. "floating" sensation.    Social History:  reports that she has never smoked. She has never used smokeless tobacco. She reports that she does not drink alcohol or use illicit drugs.  Family History: Family History  Problem Relation Age of Onset  . Cancer Brother     porstate  . Early death Sister   . Heart disease Brother   . Heart disease Brother   . Heart attack Brother   . Heart disease Brother   . Heart attack Brother   . Diabetes Sister   . Heart attack Sister   . Diabetes Sister   . Osteoporosis Sister   . Hypertension Sister     Physical Exam: Filed Vitals:   11/12/15 1902  BP: 123/67  Pulse: 60  Temp: 97.8 F (36.6 C)  TempSrc: Oral  Resp: 14  Height: 5\' 6"  (1.676 m)  Weight: 83.008 kg (183 lb)  SpO2: 97%   General appearance: alert, cooperative and no distress Head: Normocephalic, without obvious abnormality, atraumatic Eyes: negative Nose: Nares normal. Septum midline. Mucosa normal. No drainage or sinus tenderness. Neck: no JVD and supple, symmetrical, trachea midline Lungs: clear to auscultation bilaterally Heart: regular rate and rhythm, S1, S2 normal, no murmur, click, rub or  gallop Abdomen: soft, non-tender; bowel sounds normal; no masses,  no organomegaly Extremities: extremities normal, atraumatic, no cyanosis or edema Pulses: 2+ and symmetric Skin: Skin color, texture, turgor normal. No rashes or lesions Neurologic: Grossly normal    Labs on Admission:   Recent Labs  11/12/15 2000  NA 129*  K 5.3*  CL 97*  CO2 26  GLUCOSE 561*  BUN 32*  CREATININE 1.39*  CALCIUM 9.6    Recent Labs  11/12/15 2000  AST 10*  ALT 14  ALKPHOS 90  BILITOT 0.7  PROT 6.8  ALBUMIN 3.6    Recent Labs  11/12/15 2000  WBC 8.4  HGB 11.5*  HCT 34.2*  MCV 84.0  PLT 206   Radiological Exams on Admission: None   Case discussed with dr zammit Old records reviewed  Assessment/Plan  81 yo female with metabolic encephalopathy from UTI and uncontrolled diabetes  Principal Problem:   UTI (lower urinary tract infection)-  Urine cx.  Iv rocephin.  Active Problems:   Type 2 diabetes mellitus with insulin therapy ([place])-  Recheck glucose now.  Given 10 units in ED already.  Give another 10 units if not improved.  Ssi.  Hold metformin.  Clarify home dosing.   Essential hypertension- stable, noted   Diastolic CHF, chronic ([place])- compensated at this time.  Ns @75cc  hour overnight.  Holding lasix for now.   Anemia of chronic disease- stable   Carotid stenosis- noted   GAD (generalized anxiety disorder)- noted   Encephalopathy, metabolic- due to uti and hyperglycemia, check q 4 hour neuro checks overnight   DM hyperosmolarity type II, uncontrolled ([place])-  Ssi, recheck glucose now   Dementia- noted  obs on medical floor.  Presumptive full code.    Nazly Digilio A 11/12/2015, 9:50 PM

## 2015-11-12 NOTE — ED Provider Notes (Signed)
CSN: [id number]     Arrival date & time 11/12/15  1853 History   First MD Initiated Contact with Patient 11/12/15 1932     Chief Complaint  Patient presents with  . Hyperglycemia     (Consider location/radiation/quality/duration/timing/severity/associated sxs/prior Treatment) Patient is a 81 y.o. female presenting with hyperglycemia. The history is provided by a relative (The patient has been more confused and weak for the last couple days according to her daughter).  Hyperglycemia Severity:  Mild Onset quality:  Sudden Timing:  Constant Progression:  Worsening Chronicity:  Recurrent Diabetes status:  Controlled with insulin Associated symptoms: no abdominal pain, no chest pain and no fatigue     Past Medical History  Diagnosis Date  . Anemia   . Hypertension   . Iron deficiency anemia 05/27/2011  . CHF (congestive heart failure) ([place])     EF preserved Echo 2012  . 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 Carotid stenosis   . Renal insufficiency   . Furuncle of back, except buttock   . Vertigo     "when sugar gets low"  . CAD (coronary artery disease)   . Chronic kidney infection   . Arthritis     back, knees, and hips  . Myocardial infarction ([place])   . Balance problems   . Speech problem     from stroke  . Vision loss     left eye-"half blind"  . Hearing loss   . Shortness of breath dyspnea     with activity  . H/O seasonal allergies   . Pneumonia     hx of  . Asthma     with allergies  . Swallowing difficulty   . Diabetes mellitus     type 2  . Anxiety   . Depression   . GERD (gastroesophageal reflux disease)   . Dementia     "a little"   Past Surgical History  Procedure Laterality Date  . I & d of furuncle  April 2013  . Coronary artery bypass graft  2006  . Coronary angioplasty      prior to 2006 5 stents  . Total abdominal hysterectomy  ~81 years old    complete,  with tumor removal  . Cholecystectomy    . Appendectomy      with hysterectomy  . Cataract extraction Bilateral 5 years ago  . Total knee arthroplasty Right 08/13/2015    Procedure: RIGHT  TOTAL KNEE ARTHROPLASTY;  Surgeon: Paralee Cancel, MD;  Location: [facility];  Service: Orthopedics;  Laterality: Right;   Family History  Problem Relation Age of Onset  . Cancer Brother     porstate  . Early death Sister   . Heart disease Brother   . Heart disease Brother   . Heart attack Brother   . Heart disease Brother   . Heart attack Brother   . Diabetes Sister   . Heart attack Sister   . Diabetes Sister   . Osteoporosis Sister   . Hypertension Sister    Social History  Substance Use Topics  . Smoking status: Never Smoker   . Smokeless tobacco: Never Used  . Alcohol Use: No   OB History    Gravida Para Term Preterm AB TAB SAB Ectopic Multiple Living   4 4 4       3      Review of Systems  Constitutional: Negative for appetite  change and fatigue.  HENT: Negative for congestion, ear discharge and sinus pressure.   Eyes: Negative for discharge.  Respiratory: Negative for cough.   Cardiovascular: Negative for chest pain.  Gastrointestinal: Negative for abdominal pain and diarrhea.  Genitourinary: Negative for frequency and hematuria.  Musculoskeletal: Negative for back pain.  Skin: Negative for rash.  Neurological: Negative for seizures and headaches.  Psychiatric/Behavioral: Negative for hallucinations.      Allergies  Advil; Heparin; and Propoxyphene n-acetaminophen  Home Medications   Prior to Admission medications   Medication Sig Start Date End Date Taking? Authorizing Provider  amLODipine (NORVASC) 10 MG tablet TAKE (1) TABLET BY MOUTH ONCE DAILY. 11/07/15  Yes Eustaquio Maize, MD  Artificial Tear Ointment (DRY EYES OP) Apply 1 drop to eye daily as needed. For dry eye/ red eye    Yes Historical Provider, MD  atorvastatin (LIPITOR) 20 MG tablet TAKE (1) TABLET DAILY AT  BEDTIME. 11/07/15  Yes Eustaquio Maize, MD  clopidogrel (PLAVIX) 75 MG tablet TAKE (1) TABLET BY MOUTH ONCE DAILY. 10/04/15  Yes Wardell Honour, MD  docusate sodium (COLACE) 100 MG capsule Take 1 capsule (100 mg total) by mouth 2 (two) times daily. 08/15/15  Yes Matthew Babish, PA-C  esomeprazole (NEXIUM) 40 MG capsule TAKE 1 CAPSULE BY MOUTH ONCE A DAY. Patient taking differently: TAKE 1 CAPSULE BY MOUTH ONCE A DAILY AS NEEDED FOR ACID REFLUX 09/05/15  Yes Wardell Honour, MD  ferrous sulfate 325 (65 FE) MG tablet Take 1 tablet (325 mg total) by mouth 3 (three) times daily after meals. Patient taking differently: Take 325 mg by mouth 2 (two) times daily with a meal.  08/15/15  Yes Danae Orleans, PA-C  furosemide (LASIX) 20 MG tablet Take 1 tablet (20 mg total) by mouth daily as needed for fluid or edema. 09/20/14  Yes Lysbeth Penner, FNP  HYDROcodone-acetaminophen (NORCO) 7.5-325 MG per tablet Take 1-2 tablets by mouth every 4 (four) hours as needed for moderate pain. 08/15/15  Yes Matthew Babish, PA-C  insulin lispro (HUMALOG KWIKPEN) 100 UNIT/ML KiwkPen Inject 3-20 Units into the skin 3 (three) times daily as needed. sliding scale: Blood sugar 120-150 3units                       151-200 4units                      201-250 7units                     251- 300 11units                     301- 350 15units                      351-400  20units                      > 400 call MD immdediately   Yes Historical Provider, MD  insulin NPH Human (HUMULIN N) 100 UNIT/ML injection Inject 15 units each morning with breakfast Patient taking differently: Inject 40 Units into the skin daily with breakfast.  03/11/15  Yes Florencia Reasons, MD  isosorbide mononitrate (IMDUR) 30 MG 24 hr tablet Take 1 tablet (30 mg total) by mouth daily. 07/03/15  Yes Wardell Honour, MD  lisinopril (PRINIVIL,ZESTRIL) 20 MG tablet TAKE (1) TABLET BY MOUTH ONCE DAILY. 06/06/15  Yes  Wardell Honour, MD  LORazepam (ATIVAN) 0.5 MG tablet TAKE  (1) TABLET TWICE DAILY. Patient taking differently: Take 0.5 mg by mouth 2 (two) times daily.  10/08/15  Yes Wardell Honour, MD  meclizine (ANTIVERT) 25 MG tablet TAKE 1 TABLET TWICE DAILY AS NEEDED FOR DIZZINESS. Patient taking differently: TAKE 1 TABLET TWICE DAILY FOR DIZZINESS. 08/05/15  Yes Chipper Herb, MD  metFORMIN (GLUCOPHAGE-XR) 500 MG 24 hr tablet TAKE 1 TABLET WITH BREAKFAST AND 1 TABLET WITH SUPPER. 11/07/15  Yes Wardell Honour, MD  metoprolol (LOPRESSOR) 50 MG tablet TAKE (1) TABLET TWICE DAILY. 11/07/15  Yes Eustaquio Maize, MD  Multiple Vitamins-Iron (DAILY VITAMINS/IRON/BETA CAROT PO) Take 1 tablet by mouth at bedtime.   Yes Historical Provider, MD  nitroGLYCERIN (NITROSTAT) 0.4 MG SL tablet PLACE ONE (1) TABLET UNDER TONGUE EVERY 5 MINUTES UP TO (3) DOSES AS NEEDED FOR CHEST PAIN. 06/06/15  Yes Wardell Honour, MD  PARoxetine (PAXIL) 20 MG tablet TAKE (1) TABLET BY MOUTH ONCE DAILY. 11/07/15  Yes Eustaquio Maize, MD  polyethylene glycol [facility] / GLYCOLAX) packet Take 17 g by mouth 2 (two) times daily. Patient taking differently: Take 17 g by mouth daily as needed for mild constipation.  08/15/15  Yes Danae Orleans, PA-C  traMADol (ULTRAM) 50 MG tablet Take one tablet by mouth every 6 hours as needed for pain. Monitor for increased confusion, sedation, allergic reaction Patient taking differently: Take 50 mg by mouth every 6 (six) hours as needed for moderate pain. Take one tablet by mouth every 6 hours as needed for pain. Monitor for increased confusion, sedation, allergic reaction 08/19/15  Yes Tiffany L Reed, DO  cephALEXin (KEFLEX) 500 MG capsule Take 1 capsule (500 mg total) by mouth 4 (four) times daily. Patient not taking: Reported on 11/12/2015 10/17/15   [name], MD  EASY [facility] INSULIN SYRINGE 31G X 5/16" 1 ML MISC USE AS DIRECTED. 02/07/15   Chipper Herb, MD  tiZANidine (ZANAFLEX) 4 MG tablet Take 1 tablet (4 mg total) by mouth every 6 (six) hours as needed for muscle  spasms. Patient not taking: Reported on 11/12/2015 08/15/15   Danae Orleans, PA-C   BP 123/67 mmHg  Pulse 60  Temp(Src) 97.8 F (36.6 C) (Oral)  Resp 14  Ht 5\' 6"  (1.676 m)  Wt 183 lb (83.008 kg)  BMI 29.55 kg/m2  SpO2 97% Physical Exam  Constitutional: She appears well-developed.  HENT:  Head: Normocephalic.  Mucous membranes dry  Eyes: Conjunctivae and EOM are normal. No scleral icterus.  Neck: Neck supple. No thyromegaly present.  Cardiovascular: Normal rate and regular rhythm.  Exam reveals no gallop and no friction rub.   No murmur heard. Pulmonary/Chest: No stridor. She has no wheezes. She has no rales. She exhibits no tenderness.  Abdominal: She exhibits no distension. There is no tenderness. There is no rebound.  Musculoskeletal: Normal range of motion. She exhibits no edema.  Lymphadenopathy:    She has no cervical adenopathy.  Neurological: She is alert. She exhibits normal muscle tone. Coordination normal.  Oriented to person only  Skin: No rash noted. No erythema.  Psychiatric: She has a normal mood and affect. Her behavior is normal.    ED Course  Procedures (including critical care time) Labs Review Labs Reviewed  BASIC METABOLIC PANEL - Abnormal; Notable for the following:    Sodium 129 (*)    Potassium 5.3 (*)    Chloride 97 (*)    Glucose, Bld  561 (*)    BUN 32 (*)    Creatinine, Ser 1.39 (*)    GFR calc non Af Amer 34 (*)    GFR calc Af Amer 40 (*)    All other components within normal limits  CBC - Abnormal; Notable for the following:    Hemoglobin 11.5 (*)    HCT 34.2 (*)    All other components within normal limits  URINALYSIS, ROUTINE W REFLEX MICROSCOPIC (NOT AT [facility]) - Abnormal; Notable for the following:    APPearance HAZY (*)    Glucose, UA >1000 (*)    Hgb urine dipstick MODERATE (*)    Protein, ur TRACE (*)    Nitrite POSITIVE (*)    Leukocytes, UA SMALL (*)    All other components within normal limits  HEPATIC FUNCTION PANEL -  Abnormal; Notable for the following:    AST 10 (*)    All other components within normal limits  URINE MICROSCOPIC-ADD ON - Abnormal; Notable for the following:    Squamous Epithelial / LPF 0-5 (*)    Bacteria, UA MANY (*)    All other components within normal limits  CBG MONITORING, ED - Abnormal; Notable for the following:    Glucose-Capillary 536 (*)    All other components within normal limits  CBG MONITORING, ED - Abnormal; Notable for the following:    Glucose-Capillary 431 (*)    All other components within normal limits  URINE CULTURE  LACTIC ACID, PLASMA  LACTIC ACID, PLASMA    Imaging Review No results found. I have personally reviewed and evaluated these images and lab results as part of my medical decision-making.   EKG Interpretation None      MDM   Final diagnoses:  UTI (lower urinary tract infection)    Patient is being admitted for urinary tract infection and dehydration    [name], MD 11/12/15 2151

## 2015-11-12 NOTE — ED Notes (Signed)
Pericare peformed on pt, skin intact but small purple bruise note upper Rt buttock --Pt stated this was from when she had a fall.

## 2015-11-13 ENCOUNTER — Encounter (HOSPITAL_COMMUNITY): Payer: Self-pay | Admitting: *Deleted

## 2015-11-13 DIAGNOSIS — E119 Type 2 diabetes mellitus without complications: Secondary | ICD-10-CM

## 2015-11-13 DIAGNOSIS — I5032 Chronic diastolic (congestive) heart failure: Secondary | ICD-10-CM | POA: Diagnosis not present

## 2015-11-13 DIAGNOSIS — Z794 Long term (current) use of insulin: Secondary | ICD-10-CM | POA: Diagnosis not present

## 2015-11-13 DIAGNOSIS — N390 Urinary tract infection, site not specified: Principal | ICD-10-CM

## 2015-11-13 LAB — BASIC METABOLIC PANEL
ANION GAP: 6 (ref 5–15)
BUN: 25 mg/dL — ABNORMAL HIGH (ref 6–20)
CO2: 26 mmol/L (ref 22–32)
Calcium: 9.2 mg/dL (ref 8.9–10.3)
Chloride: 103 mmol/L (ref 101–111)
Creatinine, Ser: 0.96 mg/dL (ref 0.44–1.00)
GFR calc Af Amer: >60 mL/min (ref >60)
GFR, EST NON AFRICAN AMERICAN: 54 mL/min — AB (ref >60)
GLUCOSE: 232 mg/dL — AB (ref 65–99)
POTASSIUM: 4.2 mmol/L (ref 3.5–5.1)
Sodium: 135 mmol/L (ref 135–145)

## 2015-11-13 LAB — GLUCOSE, CAPILLARY
GLUCOSE-CAPILLARY: 216 mg/dL — AB (ref 65–99)
GLUCOSE-CAPILLARY: 297 mg/dL — AB (ref 65–99)
GLUCOSE-CAPILLARY: 322 mg/dL — AB (ref 65–99)
GLUCOSE-CAPILLARY: 299 mg/dL — AB (ref 65–99)

## 2015-11-13 LAB — CBC
HEMATOCRIT: 32.9 % — AB (ref 36.0–46.0)
HEMOGLOBIN: 11.1 g/dL — AB (ref 12.0–15.0)
MCH: 28.4 pg (ref 26.0–34.0)
MCHC: 33.7 g/dL (ref 30.0–36.0)
MCV: 84.1 fL (ref 78.0–100.0)
Platelets: 202 10*3/uL (ref 150–400)
RBC: 3.91 MIL/uL (ref 3.87–5.11)
RDW: 14.4 % (ref 11.5–15.5)
WBC: 6.2 10*3/uL (ref 4.0–10.5)

## 2015-11-13 MED ORDER — LISINOPRIL 10 MG PO TABS
20.0000 mg | ORAL_TABLET | Freq: Every day | ORAL | Status: DC
  Administered 2015-11-13 – 2015-11-14 (×2): 20 mg via ORAL
  Filled 2015-11-13 (×2): qty 2

## 2015-11-13 MED ORDER — ATORVASTATIN CALCIUM 20 MG PO TABS
20.0000 mg | ORAL_TABLET | Freq: Every day | ORAL | Status: DC
  Administered 2015-11-13: 20 mg via ORAL
  Filled 2015-11-13: qty 1

## 2015-11-13 MED ORDER — DOCUSATE SODIUM 100 MG PO CAPS
100.0000 mg | ORAL_CAPSULE | Freq: Two times a day (BID) | ORAL | Status: DC
  Administered 2015-11-13 – 2015-11-14 (×3): 100 mg via ORAL
  Filled 2015-11-13 (×3): qty 1

## 2015-11-13 MED ORDER — ISOSORBIDE MONONITRATE ER 60 MG PO TB24
30.0000 mg | ORAL_TABLET | Freq: Every day | ORAL | Status: DC
  Administered 2015-11-13 – 2015-11-14 (×2): 30 mg via ORAL
  Filled 2015-11-13 (×2): qty 1

## 2015-11-13 MED ORDER — INSULIN DETEMIR 100 UNIT/ML ~~LOC~~ SOLN
5.0000 [IU] | SUBCUTANEOUS | Status: AC
  Administered 2015-11-13: 5 [IU] via SUBCUTANEOUS
  Filled 2015-11-13: qty 0.05

## 2015-11-13 MED ORDER — PAROXETINE HCL 20 MG PO TABS
20.0000 mg | ORAL_TABLET | Freq: Every day | ORAL | Status: DC
  Administered 2015-11-13 – 2015-11-14 (×2): 20 mg via ORAL
  Filled 2015-11-13 (×2): qty 1

## 2015-11-13 MED ORDER — SODIUM CHLORIDE 0.9 % IV SOLN
INTRAVENOUS | Status: AC
  Administered 2015-11-13: 17:00:00 via INTRAVENOUS

## 2015-11-13 MED ORDER — AMLODIPINE BESYLATE 5 MG PO TABS
10.0000 mg | ORAL_TABLET | Freq: Every day | ORAL | Status: DC
  Administered 2015-11-13 – 2015-11-14 (×2): 10 mg via ORAL
  Filled 2015-11-13 (×2): qty 2

## 2015-11-13 MED ORDER — METFORMIN HCL ER 500 MG PO TB24: 500.0000 mg | ORAL_TABLET | Freq: Two times a day (BID) | ORAL | Status: DC

## 2015-11-13 MED ORDER — TRAMADOL HCL 50 MG PO TABS
50.0000 mg | ORAL_TABLET | Freq: Four times a day (QID) | ORAL | Status: DC | PRN
  Administered 2015-11-13: 50 mg via ORAL
  Filled 2015-11-13: qty 1

## 2015-11-13 MED ORDER — METFORMIN HCL ER 500 MG PO TB24
500.0000 mg | ORAL_TABLET | Freq: Two times a day (BID) | ORAL | Status: DC
  Administered 2015-11-13 – 2015-11-14 (×2): 500 mg via ORAL
  Filled 2015-11-13 (×2): qty 1

## 2015-11-13 MED ORDER — INSULIN DETEMIR 100 UNIT/ML ~~LOC~~ SOLN
10.0000 [IU] | Freq: Every day | SUBCUTANEOUS | Status: DC
  Administered 2015-11-13: 10 [IU] via SUBCUTANEOUS
  Filled 2015-11-13 (×2): qty 0.1

## 2015-11-13 MED ORDER — METOPROLOL TARTRATE 25 MG PO TABS
25.0000 mg | ORAL_TABLET | Freq: Two times a day (BID) | ORAL | Status: DC
  Administered 2015-11-13 – 2015-11-14 (×3): 25 mg via ORAL
  Filled 2015-11-13 (×3): qty 1

## 2015-11-13 MED ORDER — TRAMADOL HCL 50 MG PO TABS
50.0000 mg | ORAL_TABLET | Freq: Four times a day (QID) | ORAL | Status: DC
  Filled 2015-11-13: qty 1

## 2015-11-13 MED ORDER — FERROUS SULFATE 325 (65 FE) MG PO TABS
325.0000 mg | ORAL_TABLET | Freq: Three times a day (TID) | ORAL | Status: DC
  Administered 2015-11-13 – 2015-11-14 (×4): 325 mg via ORAL
  Filled 2015-11-13 (×4): qty 1

## 2015-11-13 MED ORDER — LORAZEPAM 0.5 MG PO TABS
0.5000 mg | ORAL_TABLET | Freq: Two times a day (BID) | ORAL | Status: DC
  Administered 2015-11-13 – 2015-11-14 (×3): 0.5 mg via ORAL
  Filled 2015-11-13 (×3): qty 1

## 2015-11-13 MED ORDER — PANTOPRAZOLE SODIUM 40 MG PO TBEC
40.0000 mg | DELAYED_RELEASE_TABLET | Freq: Every day | ORAL | Status: DC
  Administered 2015-11-13 – 2015-11-14 (×2): 40 mg via ORAL
  Filled 2015-11-13 (×2): qty 1

## 2015-11-13 MED ORDER — CLOPIDOGREL BISULFATE 75 MG PO TABS
75.0000 mg | ORAL_TABLET | Freq: Every day | ORAL | Status: DC
  Administered 2015-11-13 – 2015-11-14 (×2): 75 mg via ORAL
  Filled 2015-11-13 (×2): qty 1

## 2015-11-13 MED ORDER — INSULIN ASPART 100 UNIT/ML ~~LOC~~ SOLN
0.0000 [IU] | Freq: Three times a day (TID) | SUBCUTANEOUS | Status: DC
  Administered 2015-11-13: 11 [IU] via SUBCUTANEOUS
  Administered 2015-11-14: 5 [IU] via SUBCUTANEOUS
  Administered 2015-11-14: 8 [IU] via SUBCUTANEOUS
  Administered 2015-11-14: 5 [IU] via SUBCUTANEOUS

## 2015-11-13 NOTE — Progress Notes (Signed)
Inpatient Diabetes Program Recommendations  AACE/ADA: New Consensus Statement on Inpatient Glycemic Control (2015)  Target Ranges:  Prepandial:   less than 140 mg/dL      Peak postprandial:   less than 180 mg/dL (1-2 hours)      Critically ill patients:  140 - 180 mg/dL  Results for [name], [name] (MRN [id number]) as of 11/13/2015 08:43  Ref. Range 11/12/2015 19:06 11/12/2015 21:37 11/12/2015 22:52 11/12/2015 23:21 11/13/2015 07:37  Glucose-Capillary Latest Ref Range: 65-99 mg/dL 536 (H) 431 (H) 376 (H) 292 (H) 216 (H)   Review of Glycemic Control  Diabetes history: DM2 Outpatient Diabetes medications: NPH 40 units QAM, Humalog 3-20 units TID with meals, Metformin 500 mg BID Current orders for Inpatient glycemic control: Novolog 0-9 units TID with meals, Novolog 0-5 units HS  Inpatient Diabetes Program Recommendations: Insulin - Basal: Please consider ordering Levemir 10 units daily starting now. Correction (SSI): Please consider increasing Novolog correction to moderate scale.  Thanks, Barnie Alderman, RN, MSN, CDE Diabetes Coordinator Inpatient Diabetes Program [phone] (Team Pager from [place] to [place]) [phone] ([facility] office) [phone] [facility] office) [phone] [facility] office)

## 2015-11-13 NOTE — Evaluation (Signed)
Physical Therapy Evaluation Patient Details Name: [name] MRN: [id number] DOB: [birth year] Today's Date: 11/13/2015   History of Present Illness  81 yo female h/o dementia, chf, htn, IDDM brought in by daughter with whom she lives for generalized weakness, glucose high unreadable at home, dysuria for several days. More confused than her usual state. All history obtained from ed staff. Pt pleasantly confused. Says she feels much better since getting here. No family at bedside.  Clinical Impression   Pt was seen for evaluation.  She has a hx of dementia but is able to answer all questions appropriately today.  She is tired but very pleasant and cooperative.  She recently had a R TKR done for which she had begun OP PT.  Today, functional status is close to prior functional level.  She is mildly deconditioned.  She was able to ambulate 100' with a walker and stable gait.  She is independent with tranfers.  All further PT can be managed at next venue.    Follow Up Recommendations Outpatient PT (resume OP PT in [place] per pt preference)    Equipment Recommendations  None recommended by PT    Recommendations for Other Services   none    Precautions / Restrictions Precautions Precautions: Fall Restrictions Weight Bearing Restrictions: No      Mobility  Bed Mobility Overal bed mobility: Modified Independent                Transfers Overall transfer level: Modified independent                  Ambulation/Gait Ambulation/Gait assistance: Modified independent (Device/Increase time) Ambulation Distance (Feet): 100 Feet Assistive device: Rolling walker (2 wheeled) Gait Pattern/deviations: WFL(Within Functional Limits)   Gait velocity interpretation: <1.8 ft/sec, indicative of risk for recurrent falls    Stairs            Wheelchair Mobility    Modified Rankin (Stroke Patients Only)       Balance Overall balance assessment: Modified Independent                                           Pertinent Vitals/Pain Pain Assessment: No/denies pain    Home Living Family/patient expects to be discharged to:: Private residence Living Arrangements: Children Available Help at Discharge: Family;Available 24 hours/day Type of Home: Mobile home Home Access: Ramped entrance     Home Layout: One level Home Equipment: [place] - 2 wheels;Cane - quad;Toilet riser      Prior Function Level of Independence: Needs assistance   Gait / Transfers Assistance Needed: ambulates with a walker independently  ADL's / Homemaking Assistance Needed: sponge bathes but sets up herself, dressing independent...assist needed with household tasks        Hand Dominance   Dominant Hand: Right    Extremity/Trunk Assessment               Lower Extremity Assessment: RLE deficits/detail;Generalized weakness RLE Deficits / Details: TKR  09-16 with knee flexion to 95*, strength of knee is WNL    Cervical / Trunk Assessment: Kyphotic  Communication   Communication: Expressive difficulties (appears to be missing lower teeth which makes speech slurred)  Cognition Arousal/Alertness: Awake/alert Behavior During Therapy: WFL for tasks assessed/performed Overall Cognitive Status: History of cognitive impairments - at baseline  General Comments      Exercises        Assessment/Plan    PT Assessment All further PT needs can be met in the next venue of care  PT Diagnosis Generalized weakness   PT Problem List Decreased activity tolerance;Decreased mobility  PT Treatment Interventions     PT Goals (Current goals can be found in the Care Plan section) Acute Rehab PT Goals PT Goal Formulation: All assessment and education complete, DC therapy    Frequency     Barriers to discharge        Co-evaluation               End of Session Equipment Utilized During Treatment: Gait belt Activity  Tolerance: Patient tolerated treatment well Patient left: in bed;with call bell/phone within reach;with bed alarm set Nurse Communication: Mobility status    Functional Assessment Tool Used: clinical judgement Functional Limitation: Mobility: Walking and moving around Mobility: Walking and Moving Around Current Status (H6861): At least 20 percent but less than 40 percent impaired, limited or restricted Mobility: Walking and Moving Around Goal Status [phone]): At least 20 percent but less than 40 percent impaired, limited or restricted Mobility: Walking and Moving Around Discharge Status [phone]): At least 20 percent but less than 40 percent impaired, limited or restricted    Time: 1330-1415 PT Time Calculation (min) (ACUTE ONLY): 45 min   Charges:   PT Evaluation $Initial PT Evaluation Tier I: 1 Procedure     PT G Codes:   PT G-Codes **NOT FOR INPATIENT CLASS** Functional Assessment Tool Used: clinical judgement Functional Limitation: Mobility: Walking and moving around Mobility: Walking and Moving Around Current Status (D5520): At least 20 percent but less than 40 percent impaired, limited or restricted Mobility: Walking and Moving Around Goal Status [phone]): At least 20 percent but less than 40 percent impaired, limited or restricted Mobility: Walking and Moving Around Discharge Status [phone]): At least 20 percent but less than 40 percent impaired, limited or restricted    Sable Feil  PT 11/13/2015, 2:24 PM [phone]

## 2015-11-13 NOTE — Care Management Obs Status (Signed)
[facility] NOTIFICATION   Patient Details  Name: [name] MRN: [id number] Date of Birth: [birth year]   Medicare Observation Status Notification Given:  Yes    Sherald Barge, RN 11/13/2015, 2:10 PM

## 2015-11-13 NOTE — Progress Notes (Signed)
Patient Demographics  [name], is a 81 y.o. female, DOB - 1931-10-18, [id number]  Admit date - 11/12/2015   Admitting Physician Phillips Grout, MD  Outpatient Primary MD for the patient is Evelina Dun, FNP  LOS - 1   Chief Complaint  Patient presents with  . Hyperglycemia       Admission HPI/Brief narrative:  Subjective:   Babbette Greenbaum today has, No headache, No chest pain, No abdominal pain - No Nausea,C/O generalized weakness, No Cough - SOB.  Assessment & Plan    Principal Problem:   UTI (lower urinary tract infection) Active Problems:   Type 2 diabetes mellitus with insulin therapy ([place])   Essential hypertension   Diastolic CHF, chronic (HCC)   Anemia of chronic disease   Carotid stenosis   GAD (generalized anxiety disorder)   Encephalopathy, metabolic   DM hyperosmolarity type II, uncontrolled (HCC)   Dementia  Generalized weakness/acute encephalopathy - This is metabolic encephalopathy secondary to UTI and uncontrolled diabetes mellitus - No acute finding in CT head - PT consult  UTI - Continue with IV Rocephin, follow on urine culture   Diabetes mellitus - Uncontrolled, continue with insulin sliding scale, will be started on Levemir, continue to hold metformin. - Check glycohemoglobin A1c  Essential hypertension - Blood pressure acceptable, resume home medication.  Diastolic CHF - Compensated, no evidence of volume overload, monitor closely as on IV fluids.  Anemia of chronic disease - Stable, continue with iron supplement  History of coronary artery disease - Continue with Plavix, statin, beta blockers and lisinopril - Denies any chest pain or shortness of breath    Code Status: Full  Family Communication: none at bedside  Disposition Plan: home when stable   Procedures  none   Consults   none   Medications  Scheduled Meds: .  atorvastatin  20 mg Oral q1800  . cefTRIAXone (ROCEPHIN)  IV  1 g Intravenous Q24H  . clopidogrel  75 mg Oral Daily  . docusate sodium  100 mg Oral BID  . ferrous sulfate  325 mg Oral TID PC  . insulin aspart  0-5 Units Subcutaneous QHS  . insulin aspart  0-9 Units Subcutaneous TID WC  . metFORMIN  500 mg Oral BID WC  . pantoprazole  40 mg Oral Daily  . PARoxetine  20 mg Oral Daily   Continuous Infusions: . sodium chloride 75 mL/hr at 11/12/15 2345   PRN Meds:.ondansetron **OR** ondansetron (ZOFRAN) IV, traMADol  DVT Prophylaxis  SCDs(heparin allergy ??)  Lab Results  Component Value Date   PLT 202 11/13/2015    Antibiotics    Anti-infectives    Start     Dose/Rate Route Frequency Ordered Stop   11/13/15 2200  cefTRIAXone (ROCEPHIN) 1 g in dextrose 5 % 50 mL IVPB     1 g 100 mL/hr over 30 Minutes Intravenous Every 24 hours 11/12/15 2334     11/12/15 2130  cefTRIAXone (ROCEPHIN) 1 g in dextrose 5 % 50 mL IVPB     1 g Intravenous  Once 11/12/15 2129 11/12/15 2215          Objective:   Filed Vitals:   11/12/15 1902 11/12/15 2300 11/12/15 2317 11/13/15 0547  BP: 123/67 139/72  152/66 133/76  Pulse: 60 70 64 66  Temp: 97.8 F (36.6 C) 97.8 F (36.6 C) 98.2 F (36.8 C) 98.2 F (36.8 C)  TempSrc: Oral Oral Oral Oral  Resp: 14 18 18 18   Height: 5\' 6"  (1.676 m)  5\' 6"  (1.676 m)   Weight: 83.008 kg (183 lb)  74.5 kg (164 lb 3.9 oz)   SpO2: 97% 96% 98% 98%    Wt Readings from Last 3 Encounters:  11/12/15 74.5 kg (164 lb 3.9 oz)  10/29/15 84.823 kg (187 lb)  08/13/15 83.008 kg (183 lb)     Intake/Output Summary (Last 24 hours) at 11/13/15 [id number] Last data filed at 11/12/15 2318  Gross per 24 hour  Intake      0 ml  Output    100 ml  Net   -100 ml     Physical Exam  Awake Alert,  [place].AT,PERRAL Supple Neck,No JVD, Symmetrical Chest wall movement, Good air movement bilaterally, CTAB RRR,No Gallops,Rubs or new Murmurs, No Parasternal Heave +ve B.Sounds, Abd  Soft, No tenderness, No organomegaly appriciated, No rebound - guarding or rigidity. No Cyanosis, Clubbing or edema, No new Rash or bruise     Data Review   Micro Results No results found for this or any previous visit (from the past 240 hour(s)).  Radiology Reports Ct Head Wo Contrast  10/29/2015  CLINICAL DATA:  Status post fall. Hit back of head on metal heater. Altered mental status. Vertigo. Concern for cervical spine injury. Initial encounter. EXAM: CT HEAD WITHOUT CONTRAST CT CERVICAL SPINE WITHOUT CONTRAST TECHNIQUE: Multidetector CT imaging of the head and cervical spine was performed following the standard protocol without intravenous contrast. Multiplanar CT image reconstructions of the cervical spine were also generated. COMPARISON:  CT of the head performed 03/06/2015, and CT of the cervical spine performed 08/27/2014 FINDINGS: CT HEAD FINDINGS There is no evidence of acute infarction, mass lesion, or intra- or extra-axial hemorrhage on CT. Prominence of the ventricles and sulci reflects mild to moderate cortical volume loss. Mild cerebellar atrophy is noted. Scattered periventricular and subcortical white matter change likely reflects small vessel ischemic microangiopathy. A chronic infarct is noted at the right occipital lobe, with associated encephalomalacia. A small chronic infarct is also noted at the right cerebellar hemisphere. The brainstem and fourth ventricle are within normal limits. The basal ganglia are unremarkable in appearance. No mass effect or midline shift is seen. There is no evidence of fracture; visualized osseous structures are unremarkable in appearance. The orbits are within normal limits. Mild mucosal thickening is noted at the left maxillary sinus. The remaining paranasal sinuses and mastoid air cells are well-aerated. Soft tissue swelling is noted at the posterior vertex. CT CERVICAL SPINE FINDINGS There is no evidence of fracture or subluxation. Vertebral bodies  demonstrate normal height and alignment. Intervertebral disc spaces are preserved. Scattered anterior disc osteophyte complexes are seen along the lower cervical and upper thoracic spine. Prevertebral soft tissues are within normal limits. Mild facet disease is noted at the mid cervical spine. The visualized portions of the thyroid gland are unremarkable in appearance. The visualized lung apices are clear. Calcification is noted at the carotid bifurcations bilaterally. IMPRESSION: 1. No evidence of traumatic intracranial injury or fracture. 2. No evidence of fracture or subluxation along the cervical spine. 3. Mild to moderate cortical volume loss and scattered small vessel ischemic microangiopathy. 4. Chronic infarct at the right occipital lobe, with associated encephalomalacia. Small chronic infarct at the right cerebellar hemisphere. 5. Minimal  degenerative change along the lower cervical spine. 6. Soft tissue swelling at the posterior vertex. 7. Mild mucosal thickening at the left maxillary sinus. 8. Calcification at the carotid bifurcations bilaterally. Carotid ultrasound could be considered for further evaluation, when and as deemed clinically appropriate. Electronically Signed   By: Garald Balding M.D.   On: 10/29/2015 23:03   Ct Cervical Spine Wo Contrast  10/29/2015  CLINICAL DATA:  Status post fall. Hit back of head on metal heater. Altered mental status. Vertigo. Concern for cervical spine injury. Initial encounter. EXAM: CT HEAD WITHOUT CONTRAST CT CERVICAL SPINE WITHOUT CONTRAST TECHNIQUE: Multidetector CT imaging of the head and cervical spine was performed following the standard protocol without intravenous contrast. Multiplanar CT image reconstructions of the cervical spine were also generated. COMPARISON:  CT of the head performed 03/06/2015, and CT of the cervical spine performed 08/27/2014 FINDINGS: CT HEAD FINDINGS There is no evidence of acute infarction, mass lesion, or intra- or  extra-axial hemorrhage on CT. Prominence of the ventricles and sulci reflects mild to moderate cortical volume loss. Mild cerebellar atrophy is noted. Scattered periventricular and subcortical white matter change likely reflects small vessel ischemic microangiopathy. A chronic infarct is noted at the right occipital lobe, with associated encephalomalacia. A small chronic infarct is also noted at the right cerebellar hemisphere. The brainstem and fourth ventricle are within normal limits. The basal ganglia are unremarkable in appearance. No mass effect or midline shift is seen. There is no evidence of fracture; visualized osseous structures are unremarkable in appearance. The orbits are within normal limits. Mild mucosal thickening is noted at the left maxillary sinus. The remaining paranasal sinuses and mastoid air cells are well-aerated. Soft tissue swelling is noted at the posterior vertex. CT CERVICAL SPINE FINDINGS There is no evidence of fracture or subluxation. Vertebral bodies demonstrate normal height and alignment. Intervertebral disc spaces are preserved. Scattered anterior disc osteophyte complexes are seen along the lower cervical and upper thoracic spine. Prevertebral soft tissues are within normal limits. Mild facet disease is noted at the mid cervical spine. The visualized portions of the thyroid gland are unremarkable in appearance. The visualized lung apices are clear. Calcification is noted at the carotid bifurcations bilaterally. IMPRESSION: 1. No evidence of traumatic intracranial injury or fracture. 2. No evidence of fracture or subluxation along the cervical spine. 3. Mild to moderate cortical volume loss and scattered small vessel ischemic microangiopathy. 4. Chronic infarct at the right occipital lobe, with associated encephalomalacia. Small chronic infarct at the right cerebellar hemisphere. 5. Minimal degenerative change along the lower cervical spine. 6. Soft tissue swelling at the  posterior vertex. 7. Mild mucosal thickening at the left maxillary sinus. 8. Calcification at the carotid bifurcations bilaterally. Carotid ultrasound could be considered for further evaluation, when and as deemed clinically appropriate. Electronically Signed   By: Garald Balding M.D.   On: 10/29/2015 23:03   Dg Hand Complete Left  10/17/2015  CLINICAL DATA:  Post fall onto outstretched left hand now with left middle finger and ring pain. EXAM: LEFT HAND - COMPLETE 3+ VIEW COMPARISON:  None. FINDINGS: There is a slightly comminuted and very minimally displaced fracture involving the mid aspect of the middle phalanx of the fourth digit. No definite intra-articular extension. Joint spaces are preserved. No erosions. No evidence of chondrocalcinosis. Regional soft tissues appear normal. No radiopaque foreign body. IMPRESSION: Slightly comminuted and minimally displaced fracture involving the midshaft of the middle phalanx of the fourth digit without definitive intra-articular extension. No radiopaque  foreign body. Electronically Signed   By: Sandi Mariscal M.D.   On: 10/17/2015 17:31   Dg Hip Unilat With Pelvis 2-3 Views Left  10/29/2015  CLINICAL DATA:  Posterior left hip pain for 2-3 days. Recent fall. Initial encounter. EXAM: DG HIP (WITH OR WITHOUT PELVIS) 2-3V LEFT COMPARISON:  None. FINDINGS: There is no evidence of fracture or dislocation. Both femoral heads are seated normally within their respective acetabula. The proximal left femur appears intact. Mild degenerative change is noted at the lower lumbar spine. The sacroiliac joints are unremarkable in appearance. The visualized bowel gas pattern is grossly unremarkable in appearance. Scattered phleboliths are noted within the pelvis. IMPRESSION: No evidence of fracture or dislocation. Electronically Signed   By: Garald Balding M.D.   On: 10/29/2015 23:38     CBC  Recent Labs Lab 11/12/15 2000 11/13/15 0548  WBC 8.4 6.2  HGB 11.5* 11.1*  HCT  34.2* 32.9*  PLT 206 202  MCV 84.0 84.1  MCH 28.3 28.4  MCHC 33.6 33.7  RDW 14.2 14.4    Chemistries   Recent Labs Lab 11/12/15 2000 11/13/15 0548  NA 129* 135  K 5.3* 4.2  CL 97* 103  CO2 26 26  GLUCOSE 561* 232*  BUN 32* 25*  CREATININE 1.39* 0.96  CALCIUM 9.6 9.2  AST 10*  --   ALT 14  --   ALKPHOS 90  --   BILITOT 0.7  --    ------------------------------------------------------------------------------------------------------------------ estimated creatinine clearance is 46.6 mL/min (by C-G formula based on Cr of 0.96). ------------------------------------------------------------------------------------------------------------------ No results for input(s): HGBA1C in the last 72 hours. ------------------------------------------------------------------------------------------------------------------ No results for input(s): CHOL, HDL, LDLCALC, TRIG, CHOLHDL, LDLDIRECT in the last 72 hours. ------------------------------------------------------------------------------------------------------------------ No results for input(s): TSH, T4TOTAL, T3FREE, THYROIDAB in the last 72 hours.  Invalid input(s): FREET3 ------------------------------------------------------------------------------------------------------------------ No results for input(s): VITAMINB12, FOLATE, FERRITIN, TIBC, IRON, RETICCTPCT in the last 72 hours.  Coagulation profile No results for input(s): INR, PROTIME in the last 168 hours.  No results for input(s): DDIMER in the last 72 hours.  Cardiac Enzymes No results for input(s): CKMB, TROPONINI, MYOGLOBIN in the last 168 hours.  Invalid input(s): CK ------------------------------------------------------------------------------------------------------------------ Invalid input(s): POCBNP     Time Spent in minutes   30 minutes   Evert Wenrich M.D on 11/13/2015 at 9:27 AM  Between 7am to 7pm - Pager - [phone]  After 7pm go to  [email or web address] - password [email or web address]  [facility]   Office  [phone]

## 2015-11-13 NOTE — Care Management Note (Signed)
Case Management Note  Patient Details  Name: [name] MRN: [id number] Date of Birth: 07-22-1931  Subjective/Objective:                  Pt is from home, lives with her daughter and is ind with ADL's. Pt uses a walker as needed. Pt has cane, walker, wheelchair, BSC, shower chair at home. Pt goes to OP PT in [place], her next appointment is on Monday. Daughter anticipates no needs at DC.   Action/Plan: Anticipate DC home with self care and resumption of OP therapy.   Expected Discharge Date:    11/13/2015              Expected Discharge Plan:  Home/Self Care  In-House Referral:  NA  Discharge planning Services  CM Consult  Post Acute Care Choice:  NA Choice offered to:  NA  DME Arranged:    DME Agency:     HH Arranged:    HH Agency:     Status of Service:  Completed, signed off  Medicare Important Message Given:    Date Medicare IM Given:    Medicare IM give by:    Date Additional Medicare IM Given:    Additional Medicare Important Message give by:     If discussed at [place] of Stay Meetings, dates discussed:    Additional Comments:  Sherald Barge, RN 11/13/2015, 2:10 PM

## 2015-11-14 ENCOUNTER — Encounter: Payer: Medicare Other | Admitting: Physical Therapy

## 2015-11-14 DIAGNOSIS — N390 Urinary tract infection, site not specified: Secondary | ICD-10-CM | POA: Diagnosis not present

## 2015-11-14 LAB — BASIC METABOLIC PANEL
ANION GAP: 8 (ref 5–15)
BUN: 18 mg/dL (ref 6–20)
CHLORIDE: 104 mmol/L (ref 101–111)
CO2: 26 mmol/L (ref 22–32)
CREATININE: 0.93 mg/dL (ref 0.44–1.00)
Calcium: 9.6 mg/dL (ref 8.9–10.3)
GFR calc non Af Amer: 56 mL/min — ABNORMAL LOW (ref >60)
Glucose, Bld: 207 mg/dL — ABNORMAL HIGH (ref 65–99)
Potassium: 4.3 mmol/L (ref 3.5–5.1)
SODIUM: 138 mmol/L (ref 135–145)

## 2015-11-14 LAB — HEMOGLOBIN A1C
Hgb A1c MFr Bld: 11.2 % — ABNORMAL HIGH (ref 4.8–5.6)
Mean Plasma Glucose: 275 mg/dL

## 2015-11-14 LAB — GLUCOSE, CAPILLARY
GLUCOSE-CAPILLARY: 233 mg/dL — AB (ref 65–99)
GLUCOSE-CAPILLARY: 259 mg/dL — AB (ref 65–99)
Glucose-Capillary: 237 mg/dL — ABNORMAL HIGH (ref 65–99)

## 2015-11-14 MED ORDER — CIPROFLOXACIN HCL 500 MG PO TABS
500.0000 mg | ORAL_TABLET | Freq: Two times a day (BID) | ORAL | Status: AC
Start: 2015-11-14 — End: 2015-11-18

## 2015-11-14 MED ORDER — INSULIN PEN NEEDLE 31G X 5 MM MISC: Status: DC

## 2015-11-14 MED ORDER — INSULIN DETEMIR 100 UNIT/ML FLEXPEN: 28.0000 [IU] | PEN_INJECTOR | Freq: Every morning | SUBCUTANEOUS | Status: DC

## 2015-11-14 MED ORDER — INSULIN DETEMIR 100 UNIT/ML ~~LOC~~ SOLN
25.0000 [IU] | Freq: Every day | SUBCUTANEOUS | Status: DC
  Administered 2015-11-14: 25 [IU] via SUBCUTANEOUS
  Filled 2015-11-14 (×2): qty 0.25

## 2015-11-14 NOTE — Progress Notes (Signed)
Pt has been somewhat more forgetful today with signs of paranoia and irritability.  Pt stated "I know my daughter wants to put me in a home.  What are y'all doing to me?"  This is different from how patient presented yesterday.  Dr. Harlin Heys paged and made aware.  No new orders received at this time. Will continue to monitor.

## 2015-11-14 NOTE — Discharge Summary (Signed)
[name], is a 81 y.o. female  DOB [birth year]  MRN [id number].  Admission date:  11/12/2015  Admitting Physician  Phillips Grout, MD  Discharge Date:  11/14/2015   Primary MD  Evelina Dun, FNP  Recommendations for primary care physician for things to follow:  - Patient will need further monitoring and adjustment of her insulin regimen regarding diabetes, to continue on her home insulin sliding scale, insulin  NPH was changed to levemir on discharge 28 units subcutaneous daily, dose may need to be titrated up depends on her future CBG, patient was asked to keep her follow-up appointment with PCP in a.m. - Follow on the final results of urine culture, growing gram-negative rods on discharge   Admission Diagnosis  UTI (lower urinary tract infection) [N39.0] Dementia, without behavioral disturbance [F03.90]   Discharge Diagnosis  UTI (lower urinary tract infection) [N39.0] Dementia, without behavioral disturbance [F03.90]   Principal Problem:   UTI (lower urinary tract infection) Active Problems:   Type 2 diabetes mellitus with insulin therapy ([place])   Essential hypertension   Diastolic CHF, chronic (HCC)   Anemia of chronic disease   Carotid stenosis   GAD (generalized anxiety disorder)   Encephalopathy, metabolic   DM hyperosmolarity type II, uncontrolled ([place])   Dementia      Past Medical History  Diagnosis Date  . Anemia   . Hypertension   . Iron deficiency anemia 05/27/2011  . CHF (congestive heart failure) ([place])     EF preserved Echo 2012  . 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 Carotid stenosis   . Renal insufficiency   . Furuncle of back, except buttock   . Vertigo     "when sugar gets low"  . CAD (coronary artery disease)   . Chronic kidney infection   . Arthritis     back, knees, and  hips  . Myocardial infarction ([place])   . Balance problems   . Speech problem     from stroke  . Vision loss     left eye-"half blind"  . Hearing loss   . Shortness of breath dyspnea     with activity  . H/O seasonal allergies   . Pneumonia     hx of  . Asthma     with allergies  . Swallowing difficulty   . Diabetes mellitus     type 2  . Anxiety   . Depression   . GERD (gastroesophageal reflux disease)   . Dementia     "a little"    Past Surgical History  Procedure Laterality Date  . I & d of furuncle  April 2013  . Coronary artery bypass graft  2006  . Coronary angioplasty      prior to 2006 5 stents  . Total abdominal hysterectomy  ~81 years old    complete, with tumor removal  . Cholecystectomy    . Appendectomy  with hysterectomy  . Cataract extraction Bilateral 5 years ago  . Total knee arthroplasty Right 08/13/2015    Procedure: RIGHT  TOTAL KNEE ARTHROPLASTY;  Surgeon: Paralee Cancel, MD;  Location: [facility];  Service: Orthopedics;  Laterality: Right;       History of present illness and  Hospital Course:     Kindly see H&P for history of present illness and admission details, please review complete Labs, Consult reports and Test reports for all details in brief  HPI  from the history and physical done on the day of admission 11/12/2015 81 yo female h/o dementia, chf, htn, IDDM brought in by daughter with whom she lives for generalized weakness, glucose high unreadable at home, dysuria for several days. More confused than her usual state. All history obtained from ed staff. Pt pleasantly confused. Says she feels much better since getting here. No family at bedside.   Hospital Course    Generalized weakness/acute encephalopathy - This is metabolic encephalopathy secondary to UTI and uncontrolled diabetes mellitus - No acute finding in CT head - Continue outpatient PT  UTI - Treated with IV Rocephin during hospital stay, urine cultures growing  gram-negative rods, continue another 5 days on oral ciprofloxacin as an outpatient.   Diabetes mellitus - Uncontrolled,hemoglobin A1c is 11.2 , was 7.93 month ago , this is significant increase , her home insulin regimen was changed , she was instructed to continue with insulin sliding scale , started on Levemir 28 units subcutaneous daily , and he likely will need to be titrated up depends on her future CBGs , this to be followed by PCP , to continue with the results of her oral hypoglycemic agents .  Essential hypertenscontinue home medication on discharge  Chronic diastolic CHF - Compensated, no evidence of volume overload,   Anemia of chronic disease - Stable, continue with iron supplement  History of coronary artery disease - Continue with Plavix, statin, beta blockers and lisinopril - Denies any chest pain or shortness of breath    Discharge Condition:  Stable    Follow UP      Discharge Instructions  and  Discharge Medications     Discharge Instructions    Discharge instructions    Complete by:  As directed   Follow with Primary MD Evelina Dun, FNP in 7 days   Get CBC, CMP, 2 view Chest X ray checked  by Primary MD next visit.    Activity: As tolerated with Full fall precautions use walker/cane & assistance as needed   Disposition Home    Diet: Heart Healthy , carbohydrate modified , with feeding assistance and aspiration precautions.  For Heart failure patients - Check your Weight same time everyday, if you gain over 2 pounds, or you develop in leg swelling, experience more shortness of breath or chest pain, call your Primary MD immediately. Follow Cardiac Low Salt Diet and 1.5 lit/day fluid restriction.   On your next visit with your primary care physician please Get Medicines reviewed and adjusted.   Please request your Prim.MD to go over all Hospital Tests and Procedure/Radiological results at the follow up, please get all Hospital records sent to  your Prim MD by signing hospital release before you go home.   If you experience worsening of your admission symptoms, develop shortness of breath, life threatening emergency, suicidal or homicidal thoughts you must seek medical attention immediately by calling 911 or calling your MD immediately  if symptoms less severe.  You Must read complete  instructions/literature along with all the possible adverse reactions/side effects for all the Medicines you take and that have been prescribed to you. Take any new Medicines after you have completely understood and accpet all the possible adverse reactions/side effects.   Do not drive, operating heavy machinery, perform activities at heights, swimming or participation in water activities or provide baby sitting services if your were admitted for syncope or siezures until you have seen by Primary MD or a Neurologist and advised to do so again.  Do not drive when taking Pain medications.    Do not take more than prescribed Pain, Sleep and Anxiety Medications  Special Instructions: If you have smoked or chewed Tobacco  in the last 2 yrs please stop smoking, stop any regular Alcohol  and or any Recreational drug use.  Wear Seat belts while driving.   Please note  You were cared for by a hospitalist during your hospital stay. If you have any questions about your discharge medications or the care you received while you were in the hospital after you are discharged, you can call the unit and asked to speak with the hospitalist on call if the hospitalist that took care of you is not available. Once you are discharged, your primary care physician will handle any further medical issues. Please note that NO REFILLS for any discharge medications will be authorized once you are discharged, as it is imperative that you return to your primary care physician (or establish a relationship with a primary care physician if you do not have one) for your aftercare needs so  that they can reassess your need for medications and monitor your lab values.     Increase activity slowly    Complete by:  As directed             Medication List    STOP taking these medications        cephALEXin 500 MG capsule  Commonly known as:  KEFLEX     insulin NPH Human 100 UNIT/ML injection  Commonly known as:  HUMULIN N     tiZANidine 4 MG tablet  Commonly known as:  ZANAFLEX      TAKE these medications        amLODipine 10 MG tablet  Commonly known as:  NORVASC  TAKE (1) TABLET BY MOUTH ONCE DAILY.     atorvastatin 20 MG tablet  Commonly known as:  LIPITOR  TAKE (1) TABLET DAILY AT BEDTIME.     ciprofloxacin 500 MG tablet  Commonly known as:  CIPRO  Take 1 tablet (500 mg total) by mouth 2 (two) times daily.     clopidogrel 75 MG tablet  Commonly known as:  PLAVIX  TAKE (1) TABLET BY MOUTH ONCE DAILY.     DAILY VITAMINS/IRON/BETA CAROT PO  Take 1 tablet by mouth at bedtime.     docusate sodium 100 MG capsule  Commonly known as:  COLACE  Take 1 capsule (100 mg total) by mouth 2 (two) times daily.     DRY EYES OP  Apply 1 drop to eye daily as needed. For dry eye/ red eye     EASY TOUCH INSULIN SYRINGE 31G X 5/16" 1 ML Misc  Generic drug:  Insulin Syringe-Needle U-100  USE AS DIRECTED.     esomeprazole 40 MG capsule  Commonly known as:  NEXIUM  TAKE 1 CAPSULE BY MOUTH ONCE A DAY.     ferrous sulfate 325 (65 FE) MG tablet  Take 1 tablet (  325 mg total) by mouth 3 (three) times daily after meals.     furosemide 20 MG tablet  Commonly known as:  LASIX  Take 1 tablet (20 mg total) by mouth daily as needed for fluid or edema.     HUMALOG KWIKPEN 100 UNIT/ML KiwkPen  Generic drug:  insulin lispro  Inject 3-20 Units into the skin 3 (three) times daily as needed. sliding scale: Blood sugar 120-150 3units                       151-200 4units                      201-250 7units                     251- 300 11units                     301- 350 15units                       351-400  20units                      > 400 call MD immdediately     HYDROcodone-acetaminophen 7.5-325 MG tablet  Commonly known as:  NORCO  Take 1-2 tablets by mouth every 4 (four) hours as needed for moderate pain.     Insulin Detemir 100 UNIT/ML Pen  Commonly known as:  LEVEMIR  Inject 28 Units into the skin every morning.     Insulin Pen Needle 31G X 5 MM Misc  Use with  Levemir injection     isosorbide mononitrate 30 MG 24 hr tablet  Commonly known as:  IMDUR  Take 1 tablet (30 mg total) by mouth daily.     lisinopril 20 MG tablet  Commonly known as:  PRINIVIL,ZESTRIL  TAKE (1) TABLET BY MOUTH ONCE DAILY.     LORazepam 0.5 MG tablet  Commonly known as:  ATIVAN  TAKE (1) TABLET TWICE DAILY.     meclizine 25 MG tablet  Commonly known as:  ANTIVERT  TAKE 1 TABLET TWICE DAILY AS NEEDED FOR DIZZINESS.     metFORMIN 500 MG 24 hr tablet  Commonly known as:  GLUCOPHAGE-XR  TAKE 1 TABLET WITH BREAKFAST AND 1 TABLET WITH SUPPER.     metoprolol 50 MG tablet  Commonly known as:  LOPRESSOR  TAKE (1) TABLET TWICE DAILY.     nitroGLYCERIN 0.4 MG SL tablet  Commonly known as:  NITROSTAT  PLACE ONE (1) TABLET UNDER TONGUE EVERY 5 MINUTES UP TO (3) DOSES AS NEEDED FOR CHEST PAIN.     PARoxetine 20 MG tablet  Commonly known as:  PAXIL  TAKE (1) TABLET BY MOUTH ONCE DAILY.     polyethylene glycol packet  Commonly known as:  MIRALAX / GLYCOLAX  Take 17 g by mouth 2 (two) times daily.     traMADol 50 MG tablet  Commonly known as:  ULTRAM  Take one tablet by mouth every 6 hours as needed for pain. Monitor for increased confusion, sedation, allergic reaction          Diet and Activity recommendation: See Discharge Instructions above   Consults obtained - None    Major procedures and Radiology Reports - PLEASE review detailed and final reports for all details, in brief -      Ct Head Wo Contrast  10/29/2015  CLINICAL DATA:  Status post fall. Hit  back of head on metal heater. Altered mental status. Vertigo. Concern for cervical spine injury. Initial encounter. EXAM: CT HEAD WITHOUT CONTRAST CT CERVICAL SPINE WITHOUT CONTRAST TECHNIQUE: Multidetector CT imaging of the head and cervical spine was performed following the standard protocol without intravenous contrast. Multiplanar CT image reconstructions of the cervical spine were also generated. COMPARISON:  CT of the head performed 03/06/2015, and CT of the cervical spine performed 08/27/2014 FINDINGS: CT HEAD FINDINGS There is no evidence of acute infarction, mass lesion, or intra- or extra-axial hemorrhage on CT. Prominence of the ventricles and sulci reflects mild to moderate cortical volume loss. Mild cerebellar atrophy is noted. Scattered periventricular and subcortical white matter change likely reflects small vessel ischemic microangiopathy. A chronic infarct is noted at the right occipital lobe, with associated encephalomalacia. A small chronic infarct is also noted at the right cerebellar hemisphere. The brainstem and fourth ventricle are within normal limits. The basal ganglia are unremarkable in appearance. No mass effect or midline shift is seen. There is no evidence of fracture; visualized osseous structures are unremarkable in appearance. The orbits are within normal limits. Mild mucosal thickening is noted at the left maxillary sinus. The remaining paranasal sinuses and mastoid air cells are well-aerated. Soft tissue swelling is noted at the posterior vertex. CT CERVICAL SPINE FINDINGS There is no evidence of fracture or subluxation. Vertebral bodies demonstrate normal height and alignment. Intervertebral disc spaces are preserved. Scattered anterior disc osteophyte complexes are seen along the lower cervical and upper thoracic spine. Prevertebral soft tissues are within normal limits. Mild facet disease is noted at the mid cervical spine. The visualized portions of the thyroid gland are  unremarkable in appearance. The visualized lung apices are clear. Calcification is noted at the carotid bifurcations bilaterally. IMPRESSION: 1. No evidence of traumatic intracranial injury or fracture. 2. No evidence of fracture or subluxation along the cervical spine. 3. Mild to moderate cortical volume loss and scattered small vessel ischemic microangiopathy. 4. Chronic infarct at the right occipital lobe, with associated encephalomalacia. Small chronic infarct at the right cerebellar hemisphere. 5. Minimal degenerative change along the lower cervical spine. 6. Soft tissue swelling at the posterior vertex. 7. Mild mucosal thickening at the left maxillary sinus. 8. Calcification at the carotid bifurcations bilaterally. Carotid ultrasound could be considered for further evaluation, when and as deemed clinically appropriate. Electronically Signed   By: Garald Balding M.D.   On: 10/29/2015 23:03   Ct Cervical Spine Wo Contrast  10/29/2015  CLINICAL DATA:  Status post fall. Hit back of head on metal heater. Altered mental status. Vertigo. Concern for cervical spine injury. Initial encounter. EXAM: CT HEAD WITHOUT CONTRAST CT CERVICAL SPINE WITHOUT CONTRAST TECHNIQUE: Multidetector CT imaging of the head and cervical spine was performed following the standard protocol without intravenous contrast. Multiplanar CT image reconstructions of the cervical spine were also generated. COMPARISON:  CT of the head performed 03/06/2015, and CT of the cervical spine performed 08/27/2014 FINDINGS: CT HEAD FINDINGS There is no evidence of acute infarction, mass lesion, or intra- or extra-axial hemorrhage on CT. Prominence of the ventricles and sulci reflects mild to moderate cortical volume loss. Mild cerebellar atrophy is noted. Scattered periventricular and subcortical white matter change likely reflects small vessel ischemic microangiopathy. A chronic infarct is noted at the right occipital lobe, with associated  encephalomalacia. A small chronic infarct is also noted at the right cerebellar hemisphere. The brainstem and fourth ventricle are within normal  limits. The basal ganglia are unremarkable in appearance. No mass effect or midline shift is seen. There is no evidence of fracture; visualized osseous structures are unremarkable in appearance. The orbits are within normal limits. Mild mucosal thickening is noted at the left maxillary sinus. The remaining paranasal sinuses and mastoid air cells are well-aerated. Soft tissue swelling is noted at the posterior vertex. CT CERVICAL SPINE FINDINGS There is no evidence of fracture or subluxation. Vertebral bodies demonstrate normal height and alignment. Intervertebral disc spaces are preserved. Scattered anterior disc osteophyte complexes are seen along the lower cervical and upper thoracic spine. Prevertebral soft tissues are within normal limits. Mild facet disease is noted at the mid cervical spine. The visualized portions of the thyroid gland are unremarkable in appearance. The visualized lung apices are clear. Calcification is noted at the carotid bifurcations bilaterally. IMPRESSION: 1. No evidence of traumatic intracranial injury or fracture. 2. No evidence of fracture or subluxation along the cervical spine. 3. Mild to moderate cortical volume loss and scattered small vessel ischemic microangiopathy. 4. Chronic infarct at the right occipital lobe, with associated encephalomalacia. Small chronic infarct at the right cerebellar hemisphere. 5. Minimal degenerative change along the lower cervical spine. 6. Soft tissue swelling at the posterior vertex. 7. Mild mucosal thickening at the left maxillary sinus. 8. Calcification at the carotid bifurcations bilaterally. Carotid ultrasound could be considered for further evaluation, when and as deemed clinically appropriate. Electronically Signed   By: Garald Balding M.D.   On: 10/29/2015 23:03   Dg Hand Complete  Left  10/17/2015  CLINICAL DATA:  Post fall onto outstretched left hand now with left middle finger and ring pain. EXAM: LEFT HAND - COMPLETE 3+ VIEW COMPARISON:  None. FINDINGS: There is a slightly comminuted and very minimally displaced fracture involving the mid aspect of the middle phalanx of the fourth digit. No definite intra-articular extension. Joint spaces are preserved. No erosions. No evidence of chondrocalcinosis. Regional soft tissues appear normal. No radiopaque foreign body. IMPRESSION: Slightly comminuted and minimally displaced fracture involving the midshaft of the middle phalanx of the fourth digit without definitive intra-articular extension. No radiopaque foreign body. Electronically Signed   By: Sandi Mariscal M.D.   On: 10/17/2015 17:31   Dg Hip Unilat With Pelvis 2-3 Views Left  10/29/2015  CLINICAL DATA:  Posterior left hip pain for 2-3 days. Recent fall. Initial encounter. EXAM: DG HIP (WITH OR WITHOUT PELVIS) 2-3V LEFT COMPARISON:  None. FINDINGS: There is no evidence of fracture or dislocation. Both femoral heads are seated normally within their respective acetabula. The proximal left femur appears intact. Mild degenerative change is noted at the lower lumbar spine. The sacroiliac joints are unremarkable in appearance. The visualized bowel gas pattern is grossly unremarkable in appearance. Scattered phleboliths are noted within the pelvis. IMPRESSION: No evidence of fracture or dislocation. Electronically Signed   By: Garald Balding M.D.   On: 10/29/2015 23:38    Micro Results     Recent Results (from the past 240 hour(s))  Urine culture     Status: None (Preliminary result)   Collection Time: 11/12/15  8:51 PM  Result Value Ref Range Status   Specimen Description URINE, CATHETERIZED  Final   Special Requests NONE  Final   Culture   Final    >=100,000 COLONIES/mL GRAM NEGATIVE RODS Performed at [facility]    Report Status PENDING  Incomplete       Today    Subjective:   [name] today  has no headache,no chest abdominal pain,no new weakness tingling or numbness, feels much better  today.   Objective:   Blood pressure 135/45, pulse 66, temperature 98.1 F (36.7 C), temperature source Oral, resp. rate 20, height 5\' 6"  (1.676 m), weight 75.116 kg (165 lb 9.6 oz), SpO2 96 %.   Intake/Output Summary (Last 24 hours) at 11/14/15 1536 Last data filed at 11/14/15 1230  Gross per 24 hour  Intake 1073.33 ml  Output    100 ml  Net 973.33 ml    Exam Awake Alert [place].AT,PERRAL Supple Neck,No JVD, No cervical lymphadenopathy appriciated.  Symmetrical Chest wall movement, Good air movement bilaterally, CTAB RRR,No Gallops,Rubs or new Murmurs, No Parasternal Heave +ve B.Sounds, Abd Soft, Non tender, No organomegaly appriciated, No rebound -guarding or rigidity. No Cyanosis, Clubbing or edema, No new Rash or bruise  Data Review   CBC w Diff: Lab Results  Component Value Date   WBC 6.2 11/13/2015   WBC 6.2 03/21/2015   WBC 8.8 01/28/2010   HGB 11.1* 11/13/2015   HGB 9.2* 03/21/2015   HGB 8.6* 01/28/2010   HCT 32.9* 11/13/2015   HCT 28.6* 03/21/2015   HCT 27.1* 01/28/2010   PLT 202 11/13/2015   PLT 445* 01/28/2010   LYMPHOPCT 26 09/17/2015   LYMPHOPCT 11.6* 01/28/2010   MONOPCT 7 09/17/2015   MONOPCT 3.4 01/28/2010   EOSPCT 2 09/17/2015   EOSPCT 0.8 01/28/2010   BASOPCT 1 09/17/2015   BASOPCT 0.3 01/28/2010    CMP: Lab Results  Component Value Date   NA 138 11/14/2015   NA 139 03/21/2015   K 4.3 11/14/2015   CL 104 11/14/2015   CO2 26 11/14/2015   BUN 18 11/14/2015   BUN 15 03/21/2015   CREATININE 0.93 11/14/2015   CREATININE 1.30* 05/29/2013   PROT 6.8 11/12/2015   PROT 6.4 01/11/2015   ALBUMIN 3.6 11/12/2015   ALBUMIN 3.8 01/11/2015   BILITOT 0.7 11/12/2015   ALKPHOS 90 11/12/2015   AST 10* 11/12/2015   ALT 14 11/12/2015  .   Total Time in preparing paper work, data evaluation and todays exam - 35  minutes  Jais Demir M.D on 11/14/2015 at 3:36 PM  [facility]   Office  [phone]

## 2015-11-14 NOTE — Progress Notes (Signed)
Pt discharged home today per Dr. Waldron Labs.  Pt's IV site D/C'd and WDL.  Pt's VSS.  Pt's daughter provided with home medication list, discharge instructions and prescriptions.  Verbalized understanding.  Pt left floor via WC in stable condition accompanied by NT.

## 2015-11-14 NOTE — Care Management Note (Signed)
Case Management Note  Patient Details  Name: [name] MRN: [id number] Date of Birth: November 07, 1931  Pt will DC home today with self/family care and OP PT. Per pt's daughter no needs a the time of DC.   Expected Discharge Date:                  Expected Discharge Plan:  Home/Self Care  In-House Referral:  NA  Discharge planning Services  CM Consult  Post Acute Care Choice:  NA Choice offered to:  NA  DME Arranged:    DME Agency:     HH Arranged:    HH Agency:     Status of Service:  Completed, signed off  Medicare Important Message Given:    Date Medicare IM Given:    Medicare IM give by:    Date Additional Medicare IM Given:    Additional Medicare Important Message give by:     If discussed at [place] of Stay Meetings, dates discussed:    Additional Comments:  Sherald Barge, RN 11/14/2015, 1:50 PM

## 2015-11-14 NOTE — Discharge Instructions (Signed)
Follow with Primary MD Evelina Dun, FNP in 7 days   Get CBC, CMP, 2 view Chest X ray checked  by Primary MD next visit.    Activity: As tolerated with Full fall precautions use walker/cane & assistance as needed   Disposition Home    Diet: Heart Healthy , carbohydrate modified , with feeding assistance and aspiration precautions.  For Heart failure patients - Check your Weight same time everyday, if you gain over 2 pounds, or you develop in leg swelling, experience more shortness of breath or chest pain, call your Primary MD immediately. Follow Cardiac Low Salt Diet and 1.5 lit/day fluid restriction.   On your next visit with your primary care physician please Get Medicines reviewed and adjusted.   Please request your Prim.MD to go over all Hospital Tests and Procedure/Radiological results at the follow up, please get all Hospital records sent to your Prim MD by signing hospital release before you go home.   If you experience worsening of your admission symptoms, develop shortness of breath, life threatening emergency, suicidal or homicidal thoughts you must seek medical attention immediately by calling 911 or calling your MD immediately  if symptoms less severe.  You Must read complete instructions/literature along with all the possible adverse reactions/side effects for all the Medicines you take and that have been prescribed to you. Take any new Medicines after you have completely understood and accpet all the possible adverse reactions/side effects.   Do not drive, operating heavy machinery, perform activities at heights, swimming or participation in water activities or provide baby sitting services if your were admitted for syncope or siezures until you have seen by Primary MD or a Neurologist and advised to do so again.  Do not drive when taking Pain medications.    Do not take more than prescribed Pain, Sleep and Anxiety Medications  Special Instructions: If you have smoked  or chewed Tobacco  in the last 2 yrs please stop smoking, stop any regular Alcohol  and or any Recreational drug use.  Wear Seat belts while driving.   Please note  You were cared for by a hospitalist during your hospital stay. If you have any questions about your discharge medications or the care you received while you were in the hospital after you are discharged, you can call the unit and asked to speak with the hospitalist on call if the hospitalist that took care of you is not available. Once you are discharged, your primary care physician will handle any further medical issues. Please note that NO REFILLS for any discharge medications will be authorized once you are discharged, as it is imperative that you return to your primary care physician (or establish a relationship with a primary care physician if you do not have one) for your aftercare needs so that they can reassess your need for medications and monitor your lab values.

## 2015-11-15 ENCOUNTER — Ambulatory Visit (INDEPENDENT_AMBULATORY_CARE_PROVIDER_SITE_OTHER): Payer: Medicare Other | Admitting: Family Medicine

## 2015-11-15 ENCOUNTER — Encounter (INDEPENDENT_AMBULATORY_CARE_PROVIDER_SITE_OTHER): Payer: Self-pay

## 2015-11-15 ENCOUNTER — Encounter: Payer: Self-pay | Admitting: Family Medicine

## 2015-11-15 VITALS — BP 125/63 | HR 58 | Temp 97.4°F | Ht 66.0 in | Wt 169.4 lb

## 2015-11-15 DIAGNOSIS — I5032 Chronic diastolic (congestive) heart failure: Secondary | ICD-10-CM | POA: Diagnosis not present

## 2015-11-15 DIAGNOSIS — F0390 Unspecified dementia without behavioral disturbance: Secondary | ICD-10-CM

## 2015-11-15 DIAGNOSIS — E119 Type 2 diabetes mellitus without complications: Secondary | ICD-10-CM

## 2015-11-15 DIAGNOSIS — Z794 Long term (current) use of insulin: Secondary | ICD-10-CM | POA: Diagnosis not present

## 2015-11-15 LAB — URINE CULTURE

## 2015-11-15 NOTE — Progress Notes (Signed)
Subjective:    [name], female    DOB: [birth year], 81 y.o.   MRN: [id number]  HPI  81 year old female who is here to follow-up her diabetes and hypertension. Since I saw her last she's had a right knee replacement. She spent about a month and in skilled nursing. She still gets around with a walker. She's had several falls and has some residual hip pain but I'm not sure that's not related to some arthritis and knee issues. Her memory has stable. When she was in the nursing home she had some issues with what sounds like a delirium. She has had several other ER visits in the past few months for falls and broken finger in an urinary tract infection. Her last A1c tested in the hospital was 11.2 which is significantly higher than the 7.9 we observed in August 4 months ago.  Patient Active Problem List   Diagnosis Date Noted  . UTI (lower urinary tract infection) 11/12/2015  . DM hyperosmolarity type II, uncontrolled ([place]) 11/12/2015  . Dementia   . Encephalopathy, metabolic [id number]  . S/P right TKA 08/13/2015  . S/P knee replacement 08/13/2015  . Elevated troponin 03/06/2015  . Fever   . History of diabetes mellitus   . Left buttock pain   . Hyperlipidemia 01/11/2015  . GAD (generalized anxiety disorder) 01/11/2015  . Depression 01/11/2015  . GERD (gastroesophageal reflux disease) 01/11/2015  . Osteopenia 07/13/2014  . Obesity (BMI 30-39.9) 06/19/2014  . Carotid stenosis   . Anemia of chronic disease 12/25/2011  . Iron deficiency anemia 05/27/2011  . Diastolic CHF, chronic ([place]) 04/02/2010  . Type 2 diabetes mellitus with insulin therapy ([place]) 10/08/2009  . DYSLIPIDEMIA 10/08/2009  . Essential hypertension 10/08/2009  . Coronary atherosclerosis 10/08/2009  . CAROTID STENOSIS 10/08/2009   Outpatient Encounter Prescriptions as of 11/15/2015  Medication Sig  . amLODipine (NORVASC) 10 MG tablet TAKE (1) TABLET BY MOUTH ONCE DAILY.  Marland Kitchen Artificial Tear Ointment (DRY  EYES OP) Apply 1 drop to eye daily as needed. For dry eye/ red eye   . atorvastatin (LIPITOR) 20 MG tablet TAKE (1) TABLET DAILY AT BEDTIME.  . cephALEXin (KEFLEX) 500 MG capsule Take 1 capsule by mouth 2 (two) times daily.  . ciprofloxacin (CIPRO) 500 MG tablet Take 1 tablet (500 mg total) by mouth 2 (two) times daily.  . clopidogrel (PLAVIX) 75 MG tablet TAKE (1) TABLET BY MOUTH ONCE DAILY.  Marland Kitchen docusate sodium (COLACE) 100 MG capsule Take 1 capsule (100 mg total) by mouth 2 (two) times daily.  Marland Kitchen EASY TOUCH INSULIN SYRINGE 31G X 5/16" 1 ML MISC USE AS DIRECTED.  Marland Kitchen esomeprazole (NEXIUM) 40 MG capsule TAKE 1 CAPSULE BY MOUTH ONCE A DAY. (Patient taking differently: TAKE 1 CAPSULE BY MOUTH ONCE A DAILY AS NEEDED FOR ACID REFLUX)  . ferrous sulfate 325 (65 FE) MG tablet Take 1 tablet (325 mg total) by mouth 3 (three) times daily after meals. (Patient taking differently: Take 325 mg by mouth 2 (two) times daily with a meal. )  . furosemide (LASIX) 20 MG tablet Take 1 tablet (20 mg total) by mouth daily as needed for fluid or edema.  Marland Kitchen HUMULIN N 100 UNIT/ML injection Inject 28 Units into the skin every morning.  Marland Kitchen HYDROcodone-acetaminophen (NORCO) 7.5-325 MG per tablet Take 1-2 tablets by mouth every 4 (four) hours as needed for moderate pain.  . Insulin Detemir (LEVEMIR) 100 UNIT/ML Pen Inject 28 Units into the skin every morning.  Marland Kitchen  insulin lispro (HUMALOG KWIKPEN) 100 UNIT/ML KiwkPen Inject 3-20 Units into the skin 3 (three) times daily as needed. sliding scale: Blood sugar 120-150 3units                       151-200 4units                      201-250 7units                     251- 300 11units                     301- 350 15units                      351-400  20units                      > 400 call MD immdediately  . Insulin Pen Needle 31G X 5 MM MISC Use with  Levemir injection  . isosorbide mononitrate (IMDUR) 30 MG 24 hr tablet Take 1 tablet (30 mg total) by mouth daily.  Marland Kitchen lisinopril  (PRINIVIL,ZESTRIL) 20 MG tablet TAKE (1) TABLET BY MOUTH ONCE DAILY.  Marland Kitchen LORazepam (ATIVAN) 0.5 MG tablet TAKE (1) TABLET TWICE DAILY. (Patient taking differently: Take 0.5 mg by mouth 2 (two) times daily. )  . meclizine (ANTIVERT) 25 MG tablet TAKE 1 TABLET TWICE DAILY AS NEEDED FOR DIZZINESS. (Patient taking differently: TAKE 1 TABLET TWICE DAILY FOR DIZZINESS.)  . metFORMIN (GLUCOPHAGE-XR) 500 MG 24 hr tablet TAKE 1 TABLET WITH BREAKFAST AND 1 TABLET WITH SUPPER.  . metoprolol (LOPRESSOR) 50 MG tablet TAKE (1) TABLET TWICE DAILY.  . Multiple Vitamins-Iron (DAILY VITAMINS/IRON/BETA CAROT PO) Take 1 tablet by mouth at bedtime.  . nitroGLYCERIN (NITROSTAT) 0.4 MG SL tablet PLACE ONE (1) TABLET UNDER TONGUE EVERY 5 MINUTES UP TO (3) DOSES AS NEEDED FOR CHEST PAIN.  Marland Kitchen PARoxetine (PAXIL) 20 MG tablet TAKE (1) TABLET BY MOUTH ONCE DAILY.  Marland Kitchen polyethylene glycol (MIRALAX / GLYCOLAX) packet Take 17 g by mouth 2 (two) times daily. (Patient taking differently: Take 17 g by mouth daily as needed for mild constipation. )  . traMADol (ULTRAM) 50 MG tablet Take one tablet by mouth every 6 hours as needed for pain. Monitor for increased confusion, sedation, allergic reaction (Patient taking differently: Take 50 mg by mouth every 6 (six) hours as needed for moderate pain. Take one tablet by mouth every 6 hours as needed for pain. Monitor for increased confusion, sedation, allergic reaction)   No facility-administered encounter medications on file as of 11/15/2015.      Review of Systems  Constitutional: Negative.   HENT: Negative.   Respiratory: Negative.   Cardiovascular: Negative.   Musculoskeletal: Positive for arthralgias.  Neurological: Negative.        Objective:   Physical Exam  Constitutional: She is oriented to person, place, and time. She appears well-developed and well-nourished.  Cardiovascular: Normal rate and regular rhythm.   Pulmonary/Chest: Effort normal and breath sounds normal.    Musculoskeletal: She exhibits no edema.  Neurological: She is alert and oriented to person, place, and time.  In checking her memory, she could recall what she ate for supper last night.          Assessment & Plan:  1. Dementia, without behavioral disturbance His appears stable. As noted above recent memory is seems intact but there  is certainly some dementia. I would say probably more like a vascular dementia than Alzheimer's  2. Type 2 diabetes mellitus with insulin therapy ([place]) Diabetes has been neglected given her recent joint replacement falls in her urinary tract infection etc. stressed importance of monitoring her sugar watching her diet and using the insulin appropriately  3. Diastolic CHF, chronic (HCC) Eyes chest pain shortness of breath. She does have some dependent edema sporadically for which daughter gives her Lasix  Wardell Honour MD

## 2015-11-18 ENCOUNTER — Ambulatory Visit: Payer: Medicare Other | Admitting: Physical Therapy

## 2015-11-18 ENCOUNTER — Encounter: Payer: Self-pay | Admitting: Physical Therapy

## 2015-11-18 DIAGNOSIS — M25561 Pain in right knee: Secondary | ICD-10-CM

## 2015-11-18 DIAGNOSIS — M25661 Stiffness of right knee, not elsewhere classified: Secondary | ICD-10-CM | POA: Diagnosis not present

## 2015-11-18 NOTE — Therapy (Addendum)
[facility] [place], [place], [address] Phone: [phone]   Fax:  [phone]  Physical Therapy Treatment  Patient Details  Name: [name] MRN: [id number] Date of Birth: 1931/10/07 Referring Provider: Paralee Cancel MD.  Encounter Date: 11/18/2015      PT End of Session - 11/18/15 1048    Visit Number 14   Number of Visits 24  NO signed previously by MD    Date for PT Re-Evaluation 01/12/16   PT Start Time 6962   PT Stop Time 1135   PT Time Calculation (min) 48 min   Activity Tolerance Patient tolerated treatment well   Behavior During Therapy [facility] for tasks assessed/performed      Past Medical History  Diagnosis Date  . Anemia   . Hypertension   . Iron deficiency anemia 05/27/2011  . CHF (congestive heart failure) ([place])     EF preserved Echo 2012  . 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 Carotid stenosis   . Renal insufficiency   . Furuncle of back, except buttock   . Vertigo     "when sugar gets low"  . CAD (coronary artery disease)   . Chronic kidney infection   . Arthritis     back, knees, and hips  . Myocardial infarction ([place])   . Balance problems   . Speech problem     from stroke  . Vision loss     left eye-"half blind"  . Hearing loss   . Shortness of breath dyspnea     with activity  . H/O seasonal allergies   . Pneumonia     hx of  . Asthma     with allergies  . Swallowing difficulty   . Diabetes mellitus     type 2  . Anxiety   . Depression   . GERD (gastroesophageal reflux disease)   . Dementia     "a little"    Past Surgical History  Procedure Laterality Date  . I & d of furuncle  April 2013  . Coronary artery bypass graft  2006  . Coronary angioplasty      prior to 2006 5 stents  . Total abdominal hysterectomy  ~81 years old    complete, with tumor removal  . Cholecystectomy     . Appendectomy      with hysterectomy  . Cataract extraction Bilateral 5 years ago  . Total knee arthroplasty Right 08/13/2015    Procedure: RIGHT  TOTAL KNEE ARTHROPLASTY;  Surgeon: Paralee Cancel, MD;  Location: [facility];  Service: Orthopedics;  Laterality: Right;    There were no vitals filed for this visit.  Visit Diagnosis:  Right knee pain  Knee stiffness, right      Subjective Assessment - 11/18/15 1048    Subjective Reports that low back feels weak.   Limitations Walking   Patient Stated Goals Get out of pain.   Currently in Pain? Other (Comment)  Only reported low back weakness but gave no rating            [facility] PT Assessment - 11/18/15 0001    Assessment   Medical Diagnosis Right total knee replacement.   Onset Date/Surgical Date 08/13/15                     [facility] Adult PT Treatment/Exercise - 11/18/15 0001    Knee/Hip  Exercises: Aerobic   Nustep L6 x15 min   Knee/Hip Exercises: Seated   Long Arc Quad Strengthening;Right;3 sets;10 reps;Weights   Long Arc Quad Weight 4 lbs.   Knee/Hip Exercises: Supine   Bridges Limitations 2x10 reps   Straight Leg Raises Strengthening;Right;2 sets;10 reps   Other Supine Knee/Hip Exercises Clam shell in Hooklying with red band x20 reps   Modalities   Modalities Electrical Stimulation;Vasopneumatic   Electrical Stimulation   Electrical Stimulation Location R knee    Electrical Stimulation Action IFC   Electrical Stimulation Parameters 1-10 Hz x15 min   Electrical Stimulation Goals Edema   Vasopneumatic   Number Minutes Vasopneumatic  15 minutes   Vasopnuematic Location  Knee   Vasopneumatic Pressure Medium   Vasopneumatic Temperature  34                PT Education - 11/18/15 1126    Education provided Yes   Education Details HEP- bridging, SLR   Person(s) Educated Patient;Child(ren)   Methods Explanation;Verbal cues;Handout   Comprehension Verbalized understanding;Verbal cues required           PT Short Term Goals - 10/14/15 1121    PT SHORT TERM GOAL #1   Title Ind with an initial HEP.   Time 3   Period Weeks   Status Achieved   PT SHORT TERM GOAL #2   Title Full active right knee extension.   Time 3   Period Weeks   Status Achieved  R knee ext 0 deg 10/14/2015           PT Long Term Goals - 11/04/15 1114    PT LONG TERM GOAL #1   Title Ind with an advanced HEP.   Time 6   Period Weeks   Status On-going   PT LONG TERM GOAL #2   Title Right hip and knee strength= 5/5.   Time 6   Period Weeks   Status Partially Met   PT LONG TERM GOAL #3   Title Perform ADL's with pain not > 3/10.   Time 6   Period Weeks   Status On-going               Plan - 11/18/15 1132    Clinical Impression Statement Patient tolerated today's treatment fairly well although she demostrated fatigue with exercise and required rest breaks during exercise. Continues to experience low back symptoms such as low back faitgue today per patient report but gave no numerical rating. Required moderate multimodal cueing for correct exercise technique and corrections. Continues to demonstrate R knee extensor lag with R SLR. HEP directions were provided for both patient and her daughter to emphasize importance of completing exericse. Minimal increased edema noted in R knee predominately around R patella. Normal modalities response noted following removal of the modalities. Experienced low back feeling better upon end of treatment today.   Pt will benefit from skilled therapeutic intervention in order to improve on the following deficits Pain;Decreased activity tolerance;Increased edema;Decreased strength;Decreased range of motion   Rehab Potential Good   PT Frequency 3x / week   PT Duration 4 weeks   PT Treatment/Interventions ADLs/Self Care Home Management;Electrical Stimulation;Cryotherapy;Therapeutic exercise;Therapeutic activities;Patient/family education;Manual techniques;Vasopneumatic Device    PT Next Visit Plan Continue TKE, SLR, hip flexion, ABD and bridging as tolerated. Monitor R SIJ pain. and RT hip control        PT Home Exercise Plan bridging, SLR   Consulted and Agree with Plan of Care Patient;Family member/caregiver  Family Member Consulted Daughter        Problem List Patient Active Problem List   Diagnosis Date Noted  . UTI (lower urinary tract infection) 11/12/2015  . DM hyperosmolarity type II, uncontrolled ([place]) 11/12/2015  . Dementia   . Encephalopathy, metabolic 92/42/6834  . S/P right TKA 08/13/2015  . S/P knee replacement 08/13/2015  . Elevated troponin 03/06/2015  . Fever   . History of diabetes mellitus   . Left buttock pain   . Hyperlipidemia 01/11/2015  . GAD (generalized anxiety disorder) 01/11/2015  . Depression 01/11/2015  . GERD (gastroesophageal reflux disease) 01/11/2015  . Osteopenia 07/13/2014  . Obesity (BMI 30-39.9) 06/19/2014  . Carotid stenosis   . Anemia of chronic disease 12/25/2011  . Iron deficiency anemia 05/27/2011  . Diastolic CHF, chronic ([place]) 04/02/2010  . Type 2 diabetes mellitus with insulin therapy ([place]) 10/08/2009  . DYSLIPIDEMIA 10/08/2009  . Essential hypertension 10/08/2009  . Coronary atherosclerosis 10/08/2009  . CAROTID STENOSIS 10/08/2009    Wynelle Fanny, PTA 11/18/2015, 12:06 PM  [facility] [place], [place], [address] Phone: [phone]   Fax:  [phone]  Name: [name] MRN: [id number] Date of Birth: 1931-03-26

## 2015-11-18 NOTE — Patient Instructions (Signed)
Strengthening: Straight Leg Raise (Phase 1)    Tighten muscles on front of right thigh, then lift leg from surface, keeping knee locked. Don't hold your breath during exercise. Repeat __10__ times per set. Do _2___ sets per session. Do _2-3___ sessions per day.  http://orth.exer.us/614   Copyright  VHI. All rights reserved.  Bridging    Slowly raise buttocks from floor, keeping stomach tight. Don't hold your breath during exercise. Repeat __10__ times per set. Do __2__ sets per session. Do __2-3__ sessions per day.  http://orth.exer.us/1096   Copyright  VHI. All rights reserved.

## 2015-11-22 ENCOUNTER — Ambulatory Visit (INDEPENDENT_AMBULATORY_CARE_PROVIDER_SITE_OTHER): Payer: Medicare Other | Admitting: Family Medicine

## 2015-11-22 ENCOUNTER — Encounter: Payer: Self-pay | Admitting: Physical Therapy

## 2015-11-22 ENCOUNTER — Ambulatory Visit: Payer: Medicare Other | Admitting: Physical Therapy

## 2015-11-22 VITALS — BP 132/65 | HR 60 | Temp 97.1°F | Ht 66.0 in | Wt 173.4 lb

## 2015-11-22 DIAGNOSIS — M25561 Pain in right knee: Secondary | ICD-10-CM | POA: Diagnosis not present

## 2015-11-22 DIAGNOSIS — M25661 Stiffness of right knee, not elsewhere classified: Secondary | ICD-10-CM

## 2015-11-22 DIAGNOSIS — Z1212 Encounter for screening for malignant neoplasm of rectum: Secondary | ICD-10-CM

## 2015-11-22 DIAGNOSIS — K645 Perianal venous thrombosis: Secondary | ICD-10-CM

## 2015-11-22 DIAGNOSIS — R300 Dysuria: Secondary | ICD-10-CM | POA: Diagnosis not present

## 2015-11-22 LAB — POCT URINALYSIS DIPSTICK
Bilirubin, UA: NEGATIVE
GLUCOSE UA: 250
KETONES UA: NEGATIVE
Leukocytes, UA: NEGATIVE
Nitrite, UA: NEGATIVE
PROTEIN UA: NEGATIVE
RBC UA: NEGATIVE
SPEC GRAV UA: 1.025
UROBILINOGEN UA: NEGATIVE
pH, UA: 6

## 2015-11-22 LAB — POCT UA - MICROSCOPIC ONLY
Bacteria, U Microscopic: NEGATIVE
CASTS, UR, LPF, POC: NEGATIVE
Crystals, Ur, HPF, POC: NEGATIVE
MUCUS UA: NEGATIVE
RBC, urine, microscopic: NEGATIVE
YEAST UA: NEGATIVE

## 2015-11-22 MED ORDER — LINACLOTIDE 145 MCG PO CAPS: 145.0000 ug | ORAL_CAPSULE | Freq: Every day | ORAL | Status: DC

## 2015-11-22 MED ORDER — FLUOCINOLONE ACETONIDE 0.01 % EX CREA: TOPICAL_CREAM | Freq: Two times a day (BID) | CUTANEOUS | Status: DC

## 2015-11-22 NOTE — Progress Notes (Signed)
Subjective:  [name], female    DOB: 28-Dec-1930  Age: 81 y.o. MRN: [id number]  CC: Constipation and Urinary Frequency   HPI [name] presents for several days of increasing frequency of urination. She has noted some blood in her stool and tenesmus. Denies melena. Thinks she felt a hemorrhoid. Has had chronic constipation as well.  Pain has been increasing since onset. Some lower abd discomfort as well. Minimal relief with Preparation H.  History [name] has a past medical history of Anemia; Hypertension; Iron deficiency anemia (05/27/2011); CHF (congestive heart failure) ([place]); CVA (cerebral infarction); Carotid stenosis; Renal insufficiency; Furuncle of back, except buttock; Vertigo; CAD (coronary artery disease); Chronic kidney infection; Arthritis; Myocardial infarction ([place]); Balance problems; Speech problem; Vision loss; Hearing loss; Shortness of breath dyspnea; H/O seasonal allergies; Pneumonia; Asthma; Swallowing difficulty; Diabetes mellitus; Anxiety; Depression; GERD (gastroesophageal reflux disease); and Dementia.   She has past surgical history that includes I & D of Furuncle (April 2013); Coronary artery bypass graft (2006); Coronary angioplasty; Total abdominal hysterectomy (~81 years old); Cholecystectomy; Appendectomy; Cataract extraction (Bilateral, 5 years ago); and Total knee arthroplasty (Right, 08/13/2015).   Her family history includes Cancer in her brother; Diabetes in her sister and sister; Early death in her sister; Heart attack in her brother, brother, and sister; Heart disease in her brother, brother, and brother; Hypertension in her sister; Osteoporosis in her sister.She reports that she has never smoked. She has never used smokeless tobacco. She reports that she does not drink alcohol or use illicit drugs.  Outpatient Prescriptions Prior to Visit  Medication Sig Dispense Refill  . amLODipine (NORVASC) 10 MG tablet TAKE (1) TABLET BY MOUTH ONCE  DAILY. 30 tablet 0  . Artificial Tear Ointment (DRY EYES OP) Apply 1 drop to eye daily as needed. For dry eye/ red eye     . atorvastatin (LIPITOR) 20 MG tablet TAKE (1) TABLET DAILY AT BEDTIME. 30 tablet 0  . clopidogrel (PLAVIX) 75 MG tablet TAKE (1) TABLET BY MOUTH ONCE DAILY. 30 tablet 3  . docusate sodium (COLACE) 100 MG capsule Take 1 capsule (100 mg total) by mouth 2 (two) times daily. 10 capsule 0  . EASY TOUCH INSULIN SYRINGE 31G X 5/16" 1 ML MISC USE AS DIRECTED. 80 each 2  . esomeprazole (NEXIUM) 40 MG capsule TAKE 1 CAPSULE BY MOUTH ONCE A DAY. (Patient taking differently: TAKE 1 CAPSULE BY MOUTH ONCE A DAILY AS NEEDED FOR ACID REFLUX) 30 capsule 4  . ferrous sulfate 325 (65 FE) MG tablet Take 1 tablet (325 mg total) by mouth 3 (three) times daily after meals. (Patient taking differently: Take 325 mg by mouth 2 (two) times daily with a meal. )  3  . furosemide (LASIX) 20 MG tablet Take 1 tablet (20 mg total) by mouth daily as needed for fluid or edema. 30 tablet 5  . HUMULIN N 100 UNIT/ML injection Inject 28 Units into the skin every morning.    Marland Kitchen HYDROcodone-acetaminophen (NORCO) 7.5-325 MG per tablet Take 1-2 tablets by mouth every 4 (four) hours as needed for moderate pain. 100 tablet 0  . Insulin Detemir (LEVEMIR) 100 UNIT/ML Pen Inject 28 Units into the skin every morning. 15 mL 3  . insulin lispro (HUMALOG KWIKPEN) 100 UNIT/ML KiwkPen Inject 3-20 Units into the skin 3 (three) times daily as needed. sliding scale: Blood sugar 120-150 3units  151-200 4units                      201-250 7units                     251- 300 11units                     301- 350 15units                      351-400  20units                      > 400 call MD immdediately    . Insulin Pen Needle 31G X 5 MM MISC Use with  Levemir injection 100 each 0  . isosorbide mononitrate (IMDUR) 30 MG 24 hr tablet Take 1 tablet (30 mg total) by mouth daily. 30 tablet 5  . lisinopril  (PRINIVIL,ZESTRIL) 20 MG tablet TAKE (1) TABLET BY MOUTH ONCE DAILY. 30 tablet 5  . LORazepam (ATIVAN) 0.5 MG tablet TAKE (1) TABLET TWICE DAILY. (Patient taking differently: Take 0.5 mg by mouth 2 (two) times daily. ) 40 tablet 0  . meclizine (ANTIVERT) 25 MG tablet TAKE 1 TABLET TWICE DAILY AS NEEDED FOR DIZZINESS. (Patient taking differently: TAKE 1 TABLET TWICE DAILY FOR DIZZINESS.) 60 tablet 2  . metFORMIN (GLUCOPHAGE-XR) 500 MG 24 hr tablet TAKE 1 TABLET WITH BREAKFAST AND 1 TABLET WITH SUPPER. 60 tablet 0  . metoprolol (LOPRESSOR) 50 MG tablet TAKE (1) TABLET TWICE DAILY. 60 tablet 0  . Multiple Vitamins-Iron (DAILY VITAMINS/IRON/BETA CAROT PO) Take 1 tablet by mouth at bedtime.    . nitroGLYCERIN (NITROSTAT) 0.4 MG SL tablet PLACE ONE (1) TABLET UNDER TONGUE EVERY 5 MINUTES UP TO (3) DOSES AS NEEDED FOR CHEST PAIN. 25 tablet 2  . PARoxetine (PAXIL) 20 MG tablet TAKE (1) TABLET BY MOUTH ONCE DAILY. 30 tablet 0  . polyethylene glycol (MIRALAX / GLYCOLAX) packet Take 17 g by mouth 2 (two) times daily. (Patient taking differently: Take 17 g by mouth daily as needed for mild constipation. ) 14 each 0  . traMADol (ULTRAM) 50 MG tablet Take one tablet by mouth every 6 hours as needed for pain. Monitor for increased confusion, sedation, allergic reaction (Patient taking differently: Take 50 mg by mouth every 6 (six) hours as needed for moderate pain. Take one tablet by mouth every 6 hours as needed for pain. Monitor for increased confusion, sedation, allergic reaction) 120 tablet 5  . cephALEXin (KEFLEX) 500 MG capsule Take 1 capsule by mouth 2 (two) times daily.     No facility-administered medications prior to visit.    ROS Review of Systems  Constitutional: Negative for fever, activity change and appetite change.  HENT: Negative for congestion, rhinorrhea and sore throat.   Eyes: Negative for visual disturbance.  Respiratory: Negative for cough and shortness of breath.   Cardiovascular:  Negative for chest pain and palpitations.  Gastrointestinal: Negative for nausea, abdominal pain and diarrhea.  Genitourinary: Negative for urgency, decreased urine volume and pelvic pain.  Musculoskeletal: Negative for myalgias and arthralgias.    Objective:  BP 132/65 mmHg  Pulse 60  Temp(Src) 97.1 F (36.2 C) (Oral)  Ht 5\' 6"  (1.676 m)  Wt 173 lb 6.4 oz (78.654 kg)  BMI 28.00 kg/m2  SpO2 98%  BP Readings from Last 3 Encounters:  11/22/15 132/65  11/15/15 125/63  11/14/15 135/45  Wt Readings from Last 3 Encounters:  11/22/15 173 lb 6.4 oz (78.654 kg)  11/15/15 169 lb 6.4 oz (76.839 kg)  11/14/15 165 lb 9.6 oz (75.116 kg)     Physical Exam  Constitutional: She is oriented to person, place, and time. She appears well-developed and well-nourished. No distress.  HENT:  Head: Normocephalic and atraumatic.  Right Ear: External ear normal.  Left Ear: External ear normal.  Nose: Nose normal.  Mouth/Throat: Oropharynx is clear and moist.  Eyes: Conjunctivae and EOM are normal. Pupils are equal, round, and reactive to light.  Neck: Normal range of motion. Neck supple. No thyromegaly present.  Cardiovascular: Normal rate, regular rhythm and normal heart sounds.   No murmur heard. Pulmonary/Chest: Effort normal and breath sounds normal. No respiratory distress. She has no wheezes. She has no rales.  Abdominal: Soft. Bowel sounds are normal. She exhibits mass (has a ring of distended blue hemorrhoids around anal verge from 3:00 position  to 10:00. Tender. Not thrombosed.). She exhibits no distension. There is no tenderness. There is no rebound and no guarding.  Genitourinary: No vaginal discharge found.  Lymphadenopathy:    She has no cervical adenopathy.  Neurological: She is alert and oriented to person, place, and time. She has normal reflexes.  Skin: Skin is warm and dry.  Psychiatric: Her mood appears anxious. Her speech is slurred. She is agitated. Cognition and memory  are impaired. She expresses impulsivity.     Lab Results  Component Value Date   WBC 6.2 11/13/2015   HGB 11.1* 11/13/2015   HCT 32.9* 11/13/2015   PLT 202 11/13/2015   GLUCOSE 207* 11/14/2015   CHOL 131 03/06/2015   TRIG 141 03/06/2015   HDL 48 03/06/2015   LDLCALC 55 03/06/2015   ALT 14 11/12/2015   AST 10* 11/12/2015   NA 138 11/14/2015   K 4.3 11/14/2015   CL 104 11/14/2015   CREATININE 0.93 11/14/2015   BUN 18 11/14/2015   CO2 26 11/14/2015   TSH 1.707 09/17/2015   INR 1.09 08/06/2015   HGBA1C 11.2* 11/13/2015    No results found.  Assessment & Plan:   [name] was seen today for constipation and urinary frequency.  Diagnoses and all orders for this visit:  Dysuria -     POCT urinalysis dipstick -     POCT UA - Microscopic Only -     Urine culture  External hemorrhoid, thrombosed -     Urine culture  Screening for malignant neoplasm of the rectum -     Fecal occult blood, imunochemical  Other orders -     Linaclotide (LINZESS) 145 MCG CAPS capsule; Take 1 capsule (145 mcg total) by mouth daily. To regulate bowel movements -     fluocinolone (VANOS) 0.01 % cream; Apply topically 2 (two) times daily.   I have discontinued [name]'s cephALEXin. I am also having her start on Linaclotide and fluocinolone. Additionally, I am having her maintain her Artificial Tear Ointment (DRY EYES OP), Multiple Vitamins-Iron (DAILY VITAMINS/IRON/BETA CAROT PO), furosemide, EASY TOUCH INSULIN SYRINGE, lisinopril, nitroGLYCERIN, isosorbide mononitrate, meclizine, docusate sodium, ferrous sulfate, HYDROcodone-acetaminophen, polyethylene glycol, traMADol, esomeprazole, clopidogrel, LORazepam, atorvastatin, amLODipine, metoprolol, metFORMIN, PARoxetine, insulin lispro, Insulin Detemir, Insulin Pen Needle, and HUMULIN N.  Meds ordered this encounter  Medications  . Linaclotide (LINZESS) 145 MCG CAPS capsule    Sig: Take 1 capsule (145 mcg total) by mouth daily. To regulate bowel  movements    Dispense:  30 capsule  Refill:  5  . fluocinolone (VANOS) 0.01 % cream    Sig: Apply topically 2 (two) times daily.    Dispense:  30 g    Refill:  0   Due to advanced age and the position and size of the lesions, her rsik from lancing would be greater than appropriate. Will refer for removal if they can not be shrunken by softening stool and decreasing swelling and inflammation topically.  Follow-up: Return if symptoms worsen or fail to improve.  [name], M.D.

## 2015-11-22 NOTE — Therapy (Signed)
[facility] [place], [place], [address] Phone: [phone]   Fax:  [phone]  Physical Therapy Treatment  Patient Details  Name: [name] MRN: [id number] Date of Birth: [birth year] Referring Provider: Paralee Cancel MD.  Encounter Date: 11/22/2015      PT End of Session - 11/22/15 0929    Visit Number 15   Number of Visits 24   Date for PT Re-Evaluation 01/12/16   PT Start Time 0928   PT Stop Time 1007   PT Time Calculation (min) 39 min   Activity Tolerance Patient tolerated treatment well   Behavior During Therapy [facility] for tasks assessed/performed      Past Medical History  Diagnosis Date  . Anemia   . Hypertension   . Iron deficiency anemia 05/27/2011  . CHF (congestive heart failure) ([place])     EF preserved Echo 2012  . 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 Carotid stenosis   . Renal insufficiency   . Furuncle of back, except buttock   . Vertigo     "when sugar gets low"  . CAD (coronary artery disease)   . Chronic kidney infection   . Arthritis     back, knees, and hips  . Myocardial infarction ([place])   . Balance problems   . Speech problem     from stroke  . Vision loss     left eye-"half blind"  . Hearing loss   . Shortness of breath dyspnea     with activity  . H/O seasonal allergies   . Pneumonia     hx of  . Asthma     with allergies  . Swallowing difficulty   . Diabetes mellitus     type 2  . Anxiety   . Depression   . GERD (gastroesophageal reflux disease)   . Dementia     "a little"    Past Surgical History  Procedure Laterality Date  . I & d of furuncle  April 2013  . Coronary artery bypass graft  2006  . Coronary angioplasty      prior to 2006 5 stents  . Total abdominal hysterectomy  ~81 years old    complete, with tumor removal  . Cholecystectomy    . Appendectomy      with  hysterectomy  . Cataract extraction Bilateral 5 years ago  . Total knee arthroplasty Right 08/13/2015    Procedure: RIGHT  TOTAL KNEE ARTHROPLASTY;  Surgeon: Paralee Cancel, MD;  Location: [facility];  Service: Orthopedics;  Laterality: Right;    There were no vitals filed for this visit.  Visit Diagnosis:  Right knee pain  Knee stiffness, right      Subjective Assessment - 11/22/15 0928    Subjective Reports that she is sore in both hips today.   Limitations Walking   Patient Stated Goals Get out of pain.            [facility] PT Assessment - 11/22/15 0001    Assessment   Medical Diagnosis Right total knee replacement.   Onset Date/Surgical Date 08/13/15                     [facility] Adult PT Treatment/Exercise - 11/22/15 0001    Knee/Hip Exercises: Seated   Long Arc Quad Strengthening;Right;2 sets;10 reps;Weights   Long Arc Quad Weight 4 lbs.   Knee/Hip  Exercises: Supine   Bridges Limitations 2x10 reps   Modalities   Modalities Designer, multimedia Location R knee    Electrical Stimulation Action IFC   Electrical Stimulation Parameters 1-10 hz x15 min   Electrical Stimulation Goals Edema   Vasopneumatic   Number Minutes Vasopneumatic  15 minutes   Vasopnuematic Location  Knee   Vasopneumatic Pressure Medium   Vasopneumatic Temperature  34                  PT Short Term Goals - 10/14/15 1121    PT SHORT TERM GOAL #1   Title Ind with an initial HEP.   Time 3   Period Weeks   Status Achieved   PT SHORT TERM GOAL #2   Title Full active right knee extension.   Time 3   Period Weeks   Status Achieved  R knee ext 0 deg 10/14/2015           PT Long Term Goals - 11/04/15 1114    PT LONG TERM GOAL #1   Title Ind with an advanced HEP.   Time 6   Period Weeks   Status On-going   PT LONG TERM GOAL #2   Title Right hip and knee strength= 5/5.   Time 6   Period Weeks   Status  Partially Met   PT LONG TERM GOAL #3   Title Perform ADL's with pain not > 3/10.   Time 6   Period Weeks   Status On-going               Plan - 11/22/15 0955    Clinical Impression Statement Patient tolerated today's treatment fairly well today although she completed the exercises slower today. Patient demonstrated fatigue today during bridging exercise and required a short rest break. Continues to report B hip soreness at therapy treatments. Minimal pocket of edema observed in the inferiolateral aspect of the R knee today in supine. Normal modalities response noted following removal of the modalites. Many exercises were not completed today secondary to patient's late arrival. Patient was encouraged again to complete latest HEP to improve strength. Educated patient to ambulate closer to her assistive device to ensure safety. Patient experienced hips feeling "better" following today's treatment.   Pt will benefit from skilled therapeutic intervention in order to improve on the following deficits Pain;Decreased activity tolerance;Increased edema;Decreased strength;Decreased range of motion   Rehab Potential Good   PT Frequency 3x / week   PT Duration 4 weeks   PT Treatment/Interventions ADLs/Self Care Home Management;Electrical Stimulation;Cryotherapy;Therapeutic exercise;Therapeutic activities;Patient/family education;Manual techniques;Vasopneumatic Device   PT Next Visit Plan Continue TKE, SLR, hip flexion, ABD and bridging as tolerated. Monitor R SIJ pain. and RT hip control        PT Home Exercise Plan bridging, SLR   Consulted and Agree with Plan of Care Patient        Problem List Patient Active Problem List   Diagnosis Date Noted  . UTI (lower urinary tract infection) 11/12/2015  . DM hyperosmolarity type II, uncontrolled ([place]) 11/12/2015  . Dementia   . Encephalopathy, metabolic 39/02/91  . S/P right TKA 08/13/2015  . S/P knee replacement 08/13/2015  . Elevated  troponin 03/06/2015  . Fever   . History of diabetes mellitus   . Left buttock pain   . Hyperlipidemia 01/11/2015  . GAD (generalized anxiety disorder) 01/11/2015  . Depression 01/11/2015  . GERD (gastroesophageal reflux disease) 01/11/2015  .  Osteopenia 07/13/2014  . Obesity (BMI 30-39.9) 06/19/2014  . Carotid stenosis   . Anemia of chronic disease 12/25/2011  . Iron deficiency anemia 05/27/2011  . Diastolic CHF, chronic ([place]) 04/02/2010  . Type 2 diabetes mellitus with insulin therapy ([place]) 10/08/2009  . DYSLIPIDEMIA 10/08/2009  . Essential hypertension 10/08/2009  . Coronary atherosclerosis 10/08/2009  . CAROTID STENOSIS 10/08/2009    Wynelle Fanny, PTA 11/22/2015, 10:18 AM  [facility] [address] [place], [place], [address] Phone: [phone]   Fax:  [phone]  Name: [name] MRN: [id number] Date of Birth: 01/23/1931

## 2015-11-23 LAB — FECAL OCCULT BLOOD, IMMUNOCHEMICAL: Fecal Occult Bld: POSITIVE — AB

## 2015-11-24 LAB — URINE CULTURE

## 2015-11-25 ENCOUNTER — Encounter: Payer: Medicare Other | Admitting: Physical Therapy

## 2015-11-26 ENCOUNTER — Encounter: Payer: Self-pay | Admitting: Family Medicine

## 2015-11-28 ENCOUNTER — Ambulatory Visit (INDEPENDENT_AMBULATORY_CARE_PROVIDER_SITE_OTHER): Payer: Medicare Other | Admitting: Family Medicine

## 2015-11-28 ENCOUNTER — Encounter: Payer: Self-pay | Admitting: Family Medicine

## 2015-11-28 VITALS — BP 141/70 | HR 64 | Temp 98.3°F | Ht 66.0 in | Wt 172.8 lb

## 2015-11-28 DIAGNOSIS — J209 Acute bronchitis, unspecified: Secondary | ICD-10-CM

## 2015-11-28 MED ORDER — BENZONATATE 100 MG PO CAPS: 100.0000 mg | ORAL_CAPSULE | Freq: Two times a day (BID) | ORAL | Status: DC | PRN

## 2015-11-28 MED ORDER — AZITHROMYCIN 250 MG PO TABS: ORAL_TABLET | ORAL | Status: DC

## 2015-11-28 NOTE — Progress Notes (Signed)
   HPI  Patient presents today with cough and congestion.  Patient explains that she's had about 5 days of cough that seems to be worsening. She also has nasal congestion and sneezing for the first 2 or 3 days. She has normal oral intake intolerance. She denies dyspnea or chest pain. Has a history of diabetes, CAD status post multiple stents, and stroke.  She is not a smoker  PMH: Smoking status noted ROS: Per HPI  Objective: BP 141/70 mmHg  Pulse 64  Temp(Src) 98.3 F (36.8 C) (Oral)  Ht 5\' 6"  (1.676 m)  Wt 172 lb 12.8 oz (78.382 kg)  BMI 27.90 kg/m2 Gen: NAD, alert, cooperative with exam HEENT: NCAT, TMs normal bilaterally, nares clear, oropharynx clear Neck: No tender lymphadenopathy CV: RRR, good S1/S2, no murmur Resp: CTABL, no wheezes, non-labored Abd: SNTND, BS present, no guarding or organomegaly Ext: No edema, warm Neuro: Alert and oriented, No gross deficits  Assessment and plan:  # Acute bronchitis Considering age and comorbidities I think it's smartest to go ahead and cover her with azithromycin Return to clinic if worsening or does not get better as expected Tessalon for cough Supportive care    Meds ordered this encounter  Medications  . Fluocinonide 0.1 % CREA    Sig:   . azithromycin (ZITHROMAX) 250 MG tablet    Sig: Take 2 tablets on day 1 and 1 tablet daily after that    Dispense:  6 tablet    Refill:  0  . benzonatate (TESSALON) 100 MG capsule    Sig: Take 1 capsule (100 mg total) by mouth 2 (two) times daily as needed for cough.    Dispense:  20 capsule    Refill:  [place], MD [facility] 11/28/2015, 12:07 PM

## 2015-11-28 NOTE — Patient Instructions (Signed)
Great to meet you!  Come back if you get worse or don't get better as expected.   Acute Bronchitis Bronchitis is when the airways that extend from the windpipe into the lungs get red, puffy, and painful (inflamed). Bronchitis often causes thick spit (mucus) to develop. This leads to a cough. A cough is the most common symptom of bronchitis. In acute bronchitis, the condition usually begins suddenly and goes away over time (usually in 2 weeks). Smoking, allergies, and asthma can make bronchitis worse. Repeated episodes of bronchitis may cause more lung problems. HOME CARE  Rest.  Drink enough fluids to keep your pee (urine) clear or pale yellow (unless you need to limit fluids as told by your doctor).  Only take over-the-counter or prescription medicines as told by your doctor.  Avoid smoking and secondhand smoke. These can make bronchitis worse. If you are a smoker, think about using nicotine gum or skin patches. Quitting smoking will help your lungs heal faster.  Reduce the chance of getting bronchitis again by:  Washing your hands often.  Avoiding people with cold symptoms.  Trying not to touch your hands to your mouth, nose, or eyes.  Follow up with your doctor as told. GET HELP IF: Your symptoms do not improve after 1 week of treatment. Symptoms include:  Cough.  Fever.  Coughing up thick spit.  Body aches.  Chest congestion.  Chills.  Shortness of breath.  Sore throat. GET HELP RIGHT AWAY IF:   You have an increased fever.  You have chills.  You have severe shortness of breath.  You have bloody thick spit (sputum).  You throw up (vomit) often.  You lose too much body fluid (dehydration).  You have a severe headache.  You faint. MAKE SURE YOU:   Understand these instructions.  Will watch your condition.  Will get help right away if you are not doing well or get worse.   This information is not intended to replace advice given to you by your  health care provider. Make sure you discuss any questions you have with your health care provider.   Document Released: 05/11/2008 Document Revised: 07/26/2013 Document Reviewed: 05/16/2013 Elsevier Interactive Patient Education [employer].

## 2015-11-29 ENCOUNTER — Encounter: Payer: Medicare Other | Admitting: Physical Therapy

## 2015-12-03 ENCOUNTER — Encounter: Payer: Self-pay | Admitting: Physical Therapy

## 2015-12-03 ENCOUNTER — Ambulatory Visit: Payer: Medicare Other | Admitting: Physical Therapy

## 2015-12-03 DIAGNOSIS — M25561 Pain in right knee: Secondary | ICD-10-CM

## 2015-12-03 DIAGNOSIS — M25661 Stiffness of right knee, not elsewhere classified: Secondary | ICD-10-CM

## 2015-12-03 NOTE — Therapy (Signed)
[facility] [place], [place], [address] Phone: [phone]   Fax:  [phone]  Physical Therapy Treatment  Patient Details  Name: [name] MRN: [id number] Date of Birth: May 09, 1931 Referring Provider: Paralee Cancel MD.  Encounter Date: 12/03/2015      PT End of Session - 12/03/15 1038    Visit Number 16   Number of Visits 24   Date for PT Re-Evaluation 01/12/16   PT Start Time 1034   PT Stop Time 1113   PT Time Calculation (min) 39 min   Activity Tolerance Patient tolerated treatment well   Behavior During Therapy [facility] for tasks assessed/performed      Past Medical History  Diagnosis Date  . Anemia   . Hypertension   . Iron deficiency anemia 05/27/2011  . CHF (congestive heart failure) ([place])     EF preserved Echo 2012  . 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 Carotid stenosis   . Renal insufficiency   . Furuncle of back, except buttock   . Vertigo     "when sugar gets low"  . CAD (coronary artery disease)   . Chronic kidney infection   . Arthritis     back, knees, and hips  . Myocardial infarction ([place])   . Balance problems   . Speech problem     from stroke  . Vision loss     left eye-"half blind"  . Hearing loss   . Shortness of breath dyspnea     with activity  . H/O seasonal allergies   . Pneumonia     hx of  . Asthma     with allergies  . Swallowing difficulty   . Diabetes mellitus     type 2  . Anxiety   . Depression   . GERD (gastroesophageal reflux disease)   . Dementia     "a little"    Past Surgical History  Procedure Laterality Date  . I & d of furuncle  April 2013  . Coronary artery bypass graft  2006  . Coronary angioplasty      prior to 2006 5 stents  . Total abdominal hysterectomy  ~81 years old    complete, with tumor removal  . Cholecystectomy    . Appendectomy      with  hysterectomy  . Cataract extraction Bilateral 5 years ago  . Total knee arthroplasty Right 08/13/2015    Procedure: RIGHT  TOTAL KNEE ARTHROPLASTY;  Surgeon: Paralee Cancel, MD;  Location: [facility];  Service: Orthopedics;  Laterality: Right;  . Knee surgery Right     There were no vitals filed for this visit.  Visit Diagnosis:  Right knee pain  Knee stiffness, right      Subjective Assessment - 12/03/15 1037    Subjective Reports that she had increased pain in R hip region Friday. States that today her back pain is a little better.   Limitations Walking   Patient Stated Goals Get out of pain.   Currently in Pain? Other (Comment)  Gave no numerical rating for low back or R knee.            [facility] PT Assessment - 12/03/15 0001    Assessment   Medical Diagnosis Right total knee replacement.   Onset Date/Surgical Date 08/13/15  [facility] Adult PT Treatment/Exercise - 12/03/15 0001    Knee/Hip Exercises: Aerobic   Nustep L5 x 17 min   Knee/Hip Exercises: Seated   Long Arc Quad Strengthening;Right;3 sets;10 reps;Weights   Long Arc Quad Weight 4 lbs.   Hamstring Curl Strengthening;Right;3 sets;10 reps;Weights   Hamstring Limitations 4   Knee/Hip Exercises: Supine   Bridges Limitations 2x10 reps   Straight Leg Raises Strengthening;Right;3 sets;10 reps   Other Supine Knee/Hip Exercises Clam shell in [place] with red band x30 reps   Knee/Hip Exercises: Sidelying   Hip ABduction Strengthening;Right;3 sets;10 reps                  PT Short Term Goals - 10/14/15 1121    PT SHORT TERM GOAL #1   Title Ind with an initial HEP.   Time 3   Period Weeks   Status Achieved   PT SHORT TERM GOAL #2   Title Full active right knee extension.   Time 3   Period Weeks   Status Achieved  R knee ext 0 deg 10/14/2015           PT Long Term Goals - 12/03/15 1114    PT LONG TERM GOAL #1   Title Ind with an advanced HEP.   Time 6   Period Weeks    Status Achieved   PT LONG TERM GOAL #2   Title Right hip and knee strength= 5/5.   Time 6   Period Weeks   Status Partially Met   PT LONG TERM GOAL #3   Title Perform ADL's with pain not > 3/10.   Time 6   Period Weeks   Status Achieved               Plan - 12/03/15 1121    Clinical Impression Statement Patient was able to tolerate more repititions of exercises today with no R knee pain reported. Continues to report B hip soreness and discomfort during exercises. Displayed pelvic instabiltiy with bridges completed today. Required rest breaks between sets of exercises but was able to complete more. Has achieved LT HEP goal per patient report and ADLs goal although she reports hip pain with ADLs. Displayed L hip weakness with supine clamshell with red theraband. Experienced R hip soreness following sidelying R hip abduction following today's treatment but no R knee pain.   Pt will benefit from skilled therapeutic intervention in order to improve on the following deficits Pain;Decreased activity tolerance;Increased edema;Decreased strength;Decreased range of motion   Rehab Potential Good   PT Frequency 3x / week   PT Duration 4 weeks   PT Treatment/Interventions ADLs/Self Care Home Management;Electrical Stimulation;Cryotherapy;Therapeutic exercise;Therapeutic activities;Patient/family education;Manual techniques;Vasopneumatic Device   PT Next Visit Plan Continue TKE, SLR, hip flexion, ABD and bridging as tolerated. Monitor R SIJ pain. and RT hip control        PT Home Exercise Plan bridging, SLR   Consulted and Agree with Plan of Care Patient        Problem List Patient Active Problem List   Diagnosis Date Noted  . UTI (lower urinary tract infection) 11/12/2015  . DM hyperosmolarity type II, uncontrolled ([place]) 11/12/2015  . Dementia   . Encephalopathy, metabolic 32/67/1245  . S/P right TKA 08/13/2015  . S/P knee replacement 08/13/2015  . Elevated troponin 03/06/2015  .  Fever   . History of diabetes mellitus   . Left buttock pain   . Hyperlipidemia 01/11/2015  . GAD (generalized anxiety disorder) 01/11/2015  .  Depression 01/11/2015  . GERD (gastroesophageal reflux disease) 01/11/2015  . Osteopenia 07/13/2014  . Obesity (BMI 30-39.9) 06/19/2014  . Carotid stenosis   . Anemia of chronic disease 12/25/2011  . Iron deficiency anemia 05/27/2011  . Diastolic CHF, chronic ([place]) 04/02/2010  . Type 2 diabetes mellitus with insulin therapy ([place]) 10/08/2009  . DYSLIPIDEMIA 10/08/2009  . Essential hypertension 10/08/2009  . Coronary atherosclerosis 10/08/2009  . CAROTID STENOSIS 10/08/2009    Wynelle Fanny, PTA 12/03/2015, 11:30 AM  [facility] [address] [place], [place], [address] Phone: [phone]   Fax:  [phone]  Name: [name] MRN: [id number] Date of Birth: 1931-04-16

## 2015-12-04 ENCOUNTER — Other Ambulatory Visit: Payer: Self-pay | Admitting: Family Medicine

## 2015-12-10 ENCOUNTER — Ambulatory Visit: Payer: Medicare Other | Attending: Orthopedic Surgery | Admitting: Physical Therapy

## 2015-12-10 ENCOUNTER — Encounter: Payer: Self-pay | Admitting: Physical Therapy

## 2015-12-10 ENCOUNTER — Other Ambulatory Visit: Payer: Self-pay

## 2015-12-10 DIAGNOSIS — Z1231 Encounter for screening mammogram for malignant neoplasm of breast: Secondary | ICD-10-CM | POA: Diagnosis not present

## 2015-12-10 DIAGNOSIS — M25661 Stiffness of right knee, not elsewhere classified: Secondary | ICD-10-CM | POA: Insufficient documentation

## 2015-12-10 DIAGNOSIS — M25561 Pain in right knee: Secondary | ICD-10-CM | POA: Insufficient documentation

## 2015-12-10 LAB — HM MAMMOGRAPHY

## 2015-12-10 NOTE — Therapy (Signed)
[facility] [place], [place], [address] Phone: [phone]   Fax:  [phone]  Physical Therapy Treatment  Patient Details  Name: [name] MRN: [id number] Date of Birth: January 19, 1931 Referring Provider: Paralee Cancel MD.  Encounter Date: 12/10/2015      PT End of Session - 12/10/15 0949    Visit Number 17   Number of Visits 24   Date for PT Re-Evaluation 01/12/16   PT Start Time 0947   PT Stop Time 1027   PT Time Calculation (min) 40 min   Activity Tolerance Patient tolerated treatment well   Behavior During Therapy [facility] for tasks assessed/performed      Past Medical History  Diagnosis Date  . Anemia   . Hypertension   . Iron deficiency anemia 05/27/2011  . CHF (congestive heart failure) ([place])     EF preserved Echo 2012  . 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 Carotid stenosis   . Renal insufficiency   . Furuncle of back, except buttock   . Vertigo     "when sugar gets low"  . CAD (coronary artery disease)   . Chronic kidney infection   . Arthritis     back, knees, and hips  . Myocardial infarction ([place])   . Balance problems   . Speech problem     from stroke  . Vision loss     left eye-"half blind"  . Hearing loss   . Shortness of breath dyspnea     with activity  . H/O seasonal allergies   . Pneumonia     hx of  . Asthma     with allergies  . Swallowing difficulty   . Diabetes mellitus     type 2  . Anxiety   . Depression   . GERD (gastroesophageal reflux disease)   . Dementia     "a little"    Past Surgical History  Procedure Laterality Date  . I & d of furuncle  April 2013  . Coronary artery bypass graft  2006  . Coronary angioplasty      prior to 2006 5 stents  . Total abdominal hysterectomy  ~82 years old    complete, with tumor removal  . Cholecystectomy    . Appendectomy      with  hysterectomy  . Cataract extraction Bilateral 5 years ago  . Total knee arthroplasty Right 08/13/2015    Procedure: RIGHT  TOTAL KNEE ARTHROPLASTY;  Surgeon: Paralee Cancel, MD;  Location: [facility];  Service: Orthopedics;  Laterality: Right;  . Knee surgery Right     There were no vitals filed for this visit.  Visit Diagnosis:  Right knee pain  Knee stiffness, right      Subjective Assessment - 12/10/15 0948    Subjective Reports that RLE is sore from hip downwards.   Limitations Walking   Patient Stated Goals Get out of pain.   Currently in Pain? Other (Comment)  Reported soreness but gave no numerical rating            [facility] PT Assessment - 12/10/15 0001    Assessment   Medical Diagnosis Right total knee replacement.   Onset Date/Surgical Date 08/13/15                     [facility] Adult PT Treatment/Exercise - 12/10/15 0001    Knee/Hip Exercises:  Aerobic   Stationary Bike L1 x12 min   Knee/Hip Exercises: Standing   Lateral Step Up Right;3 sets;10 reps;Hand Hold: 2;Step Height: 4"   Forward Step Up Right;3 sets;10 reps;Hand Hold: 2;Step Height: 4"   Knee/Hip Exercises: Seated   Long Arc Quad Strengthening;Right;3 sets;10 reps;Weights   Long Arc Quad Weight 4 lbs.   Knee/Hip Exercises: Supine   Bridges Limitations 2x10 reps   Bridges with [employer] Strengthening;Both;2 sets;10 reps   Straight Leg Raises Strengthening;Right;3 sets;10 reps   Knee/Hip Exercises: Sidelying   Hip ABduction Strengthening;Right;2 sets;10 reps   Clams R clamshell red theraband 2x10 reps                  PT Short Term Goals - 10/14/15 1121    PT SHORT TERM GOAL #1   Title Ind with an initial HEP.   Time 3   Period Weeks   Status Achieved   PT SHORT TERM GOAL #2   Title Full active right knee extension.   Time 3   Period Weeks   Status Achieved  R knee ext 0 deg 10/14/2015           PT Long Term Goals - 12/03/15 1114    PT LONG TERM GOAL #1   Title Ind  with an advanced HEP.   Time 6   Period Weeks   Status Achieved   PT LONG TERM GOAL #2   Title Right hip and knee strength= 5/5.   Time 6   Period Weeks   Status Partially Met   PT LONG TERM GOAL #3   Title Perform ADL's with pain not > 3/10.   Time 6   Period Weeks   Status Achieved               Plan - 12/10/15 1029    Clinical Impression Statement Patient tolerated today's treatment fairly well although she required extra time to complete exercises and for rest between sets. Tolerated step activities better using 4" step although lateral step was much more difficult for patient secondary to weakness and fatigue. Continues to display pelvic instabiltiy with bridging actvities today. Has weakness with R clamshell with red theraband. Experienced low back discomfort following treatment today.    Pt will benefit from skilled therapeutic intervention in order to improve on the following deficits Pain;Decreased activity tolerance;Increased edema;Decreased strength;Decreased range of motion   Rehab Potential Good   PT Frequency 3x / week   PT Duration 4 weeks   PT Treatment/Interventions ADLs/Self Care Home Management;Electrical Stimulation;Cryotherapy;Therapeutic exercise;Therapeutic activities;Patient/family education;Manual techniques;Vasopneumatic Device   PT Next Visit Plan Continue TKE, SLR, hip flexion, ABD and bridging as tolerated. Monitor R SIJ pain. and RT hip control        PT Home Exercise Plan bridging, SLR   Consulted and Agree with Plan of Care Patient        Problem List Patient Active Problem List   Diagnosis Date Noted  . UTI (lower urinary tract infection) 11/12/2015  . DM hyperosmolarity type II, uncontrolled ([place]) 11/12/2015  . Dementia   . Encephalopathy, metabolic 25/36/6440  . S/P right TKA 08/13/2015  . S/P knee replacement 08/13/2015  . Elevated troponin 03/06/2015  . Fever   . History of diabetes mellitus   . Left buttock pain   .  Hyperlipidemia 01/11/2015  . GAD (generalized anxiety disorder) 01/11/2015  . Depression 01/11/2015  . GERD (gastroesophageal reflux disease) 01/11/2015  . Osteopenia 07/13/2014  . Obesity (BMI 30-39.9)  06/19/2014  . Carotid stenosis   . Anemia of chronic disease 12/25/2011  . Iron deficiency anemia 05/27/2011  . Diastolic CHF, chronic ([place]) 04/02/2010  . Type 2 diabetes mellitus with insulin therapy ([place]) 10/08/2009  . DYSLIPIDEMIA 10/08/2009  . Essential hypertension 10/08/2009  . Coronary atherosclerosis 10/08/2009  . CAROTID STENOSIS 10/08/2009    Wynelle Fanny, PTA 12/10/2015, 11:19 AM  [facility] [address] [place], [place], [address] Phone: [phone]   Fax:  [phone]  Name: [name] MRN: [id number] Date of Birth: 06/11/1931

## 2015-12-10 NOTE — Telephone Encounter (Signed)
Patient last seen in office on 11-28-15. Rx last filled on 11-06-15 for #[phone]. Please advise. If approved please route to pool A so nurse can phone in to [place]. Patient of Dr Sabra Heck

## 2015-12-10 NOTE — Telephone Encounter (Signed)
Please give enough to last until patient can see Dr. Sabra Heck again

## 2015-12-12 ENCOUNTER — Other Ambulatory Visit: Payer: Self-pay

## 2015-12-12 MED ORDER — LORAZEPAM 0.5 MG PO TABS: ORAL_TABLET | ORAL | Status: DC

## 2015-12-12 NOTE — Telephone Encounter (Signed)
Last seen 11/28/15  Dr Wendi Snipes   If approved route to nurse to call into [place]   [phone]

## 2015-12-12 NOTE — Telephone Encounter (Signed)
rx called into pharmacy and detailed message left for patient that she will need a follow up with her pcp.

## 2015-12-13 ENCOUNTER — Encounter: Payer: Self-pay | Admitting: *Deleted

## 2015-12-16 ENCOUNTER — Encounter: Payer: Medicare Other | Admitting: Physical Therapy

## 2015-12-16 ENCOUNTER — Other Ambulatory Visit: Payer: Self-pay | Admitting: Cardiology

## 2015-12-16 DIAGNOSIS — I6523 Occlusion and stenosis of bilateral carotid arteries: Secondary | ICD-10-CM

## 2015-12-18 ENCOUNTER — Ambulatory Visit (INDEPENDENT_AMBULATORY_CARE_PROVIDER_SITE_OTHER): Payer: Medicare Other | Admitting: Family Medicine

## 2015-12-18 ENCOUNTER — Encounter: Payer: Self-pay | Admitting: Family Medicine

## 2015-12-18 VITALS — BP 132/64 | HR 68 | Temp 98.2°F | Ht 66.0 in | Wt 168.0 lb

## 2015-12-18 DIAGNOSIS — N3944 Nocturnal enuresis: Secondary | ICD-10-CM | POA: Diagnosis not present

## 2015-12-18 DIAGNOSIS — R159 Full incontinence of feces: Secondary | ICD-10-CM

## 2015-12-18 NOTE — Progress Notes (Signed)
Subjective:    [name], female    DOB: [birth year], 82 y.o.   MRN: [id number]  HPI 82 year old female with incontinence of urine and stool. In reviewing her medicines are several medicines which could be related to her stool problem and I suggested that we stop these medicines: Linzess Colace and metformin. Regarding the metformin she is on both long-acting and short-acting insulin and I believe the sugars could be satisfactorily controlled with use of these insulins since metformin can cause some diarrhea. The urinary incontinence is primarily of the time nighttime issue. In the daytime there is no similar problem. She does take Lasix on an as-needed basis. There is no history of urinary tract infection. She does tend to drink fluids at nighttime and I have suggested that may be cut back on the fluids after supper except for taking medication and for dry mouth use other means such as biotin.  She had knee replacement done in the past year and now daughter is concerned that her foot on that same side is everting. This is more noticeable with walking.    Review of Systems  Constitutional: Negative.   Cardiovascular: Negative.   Gastrointestinal: Positive for diarrhea.  Genitourinary: Positive for enuresis.  Musculoskeletal: Positive for gait problem.  Psychiatric/Behavioral: Positive for confusion.   Patient Active Problem List   Diagnosis Date Noted  . UTI (lower urinary tract infection) 11/12/2015  . DM hyperosmolarity type II, uncontrolled ([place]) 11/12/2015  . Dementia   . Encephalopathy, metabolic [id number]  . S/P right TKA 08/13/2015  . S/P knee replacement 08/13/2015  . Elevated troponin 03/06/2015  . Fever   . History of diabetes mellitus   . Left buttock pain   . Hyperlipidemia 01/11/2015  . GAD (generalized anxiety disorder) 01/11/2015  . Depression 01/11/2015  . GERD (gastroesophageal reflux disease) 01/11/2015  . Osteopenia 07/13/2014  . Obesity  (BMI 30-39.9) 06/19/2014  . Carotid stenosis   . Anemia of chronic disease 12/25/2011  . Iron deficiency anemia 05/27/2011  . Diastolic CHF, chronic ([place]) 04/02/2010  . Type 2 diabetes mellitus with insulin therapy ([place]) 10/08/2009  . DYSLIPIDEMIA 10/08/2009  . Essential hypertension 10/08/2009  . Coronary atherosclerosis 10/08/2009  . CAROTID STENOSIS 10/08/2009   Outpatient Encounter Prescriptions as of 12/18/2015  Medication Sig  . amLODipine (NORVASC) 10 MG tablet TAKE (1) TABLET BY MOUTH ONCE DAILY.  Marland Kitchen Artificial Tear Ointment (DRY EYES OP) Apply 1 drop to eye daily as needed. For dry eye/ red eye   . atorvastatin (LIPITOR) 20 MG tablet TAKE (1) TABLET DAILY AT BEDTIME.  Marland Kitchen azithromycin (ZITHROMAX) 250 MG tablet Take 2 tablets on day 1 and 1 tablet daily after that  . clopidogrel (PLAVIX) 75 MG tablet TAKE (1) TABLET BY MOUTH ONCE DAILY.  Marland Kitchen docusate sodium (COLACE) 100 MG capsule Take 1 capsule (100 mg total) by mouth 2 (two) times daily.  Marland Kitchen EASY TOUCH INSULIN SYRINGE 31G X 5/16" 1 ML MISC USE AS DIRECTED.  Marland Kitchen esomeprazole (NEXIUM) 40 MG capsule TAKE 1 CAPSULE BY MOUTH ONCE A DAY. (Patient taking differently: TAKE 1 CAPSULE BY MOUTH ONCE A DAILY AS NEEDED FOR ACID REFLUX)  . ferrous sulfate 325 (65 FE) MG tablet Take 1 tablet (325 mg total) by mouth 3 (three) times daily after meals. (Patient taking differently: Take 325 mg by mouth 2 (two) times daily with a meal. )  . fluocinolone (VANOS) 0.01 % cream Apply topically 2 (two) times daily.  . Fluocinonide 0.1 %  CREA   . furosemide (LASIX) 20 MG tablet Take 1 tablet (20 mg total) by mouth daily as needed for fluid or edema.  Marland Kitchen HUMULIN N 100 UNIT/ML injection Inject 28 Units into the skin every morning.  Marland Kitchen HYDROcodone-acetaminophen (NORCO) 7.5-325 MG per tablet Take 1-2 tablets by mouth every 4 (four) hours as needed for moderate pain.  . Insulin Detemir (LEVEMIR) 100 UNIT/ML Pen Inject 28 Units into the skin every morning.  . insulin  lispro (HUMALOG KWIKPEN) 100 UNIT/ML KiwkPen Inject 3-20 Units into the skin 3 (three) times daily as needed. sliding scale: Blood sugar 120-150 3units                       151-200 4units                      201-250 7units                     251- 300 11units                     301- 350 15units                      351-400  20units                      > 400 call MD immdediately  . Insulin Pen Needle 31G X 5 MM MISC Use with  Levemir injection  . isosorbide mononitrate (IMDUR) 30 MG 24 hr tablet Take 1 tablet (30 mg total) by mouth daily.  . Linaclotide (LINZESS) 145 MCG CAPS capsule Take 1 capsule (145 mcg total) by mouth daily. To regulate bowel movements  . lisinopril (PRINIVIL,ZESTRIL) 20 MG tablet TAKE (1) TABLET BY MOUTH ONCE DAILY.  Marland Kitchen LORazepam (ATIVAN) 0.5 MG tablet TAKE (1) TABLET TWICE DAILY as needed for anxiety  . meclizine (ANTIVERT) 25 MG tablet TAKE 1 TABLET TWICE DAILY AS NEEDED FOR DIZZINESS. (Patient taking differently: TAKE 1 TABLET TWICE DAILY FOR DIZZINESS.)  . metFORMIN (GLUCOPHAGE-XR) 500 MG 24 hr tablet TAKE 1 TABLET WITH BREAKFAST AND 1 TABLET WITH SUPPER.  . metoprolol (LOPRESSOR) 50 MG tablet TAKE (1) TABLET TWICE DAILY.  . Multiple Vitamins-Iron (DAILY VITAMINS/IRON/BETA CAROT PO) Take 1 tablet by mouth at bedtime.  . nitroGLYCERIN (NITROSTAT) 0.4 MG SL tablet PLACE ONE (1) TABLET UNDER TONGUE EVERY 5 MINUTES UP TO (3) DOSES AS NEEDED FOR CHEST PAIN.  Marland Kitchen PARoxetine (PAXIL) 20 MG tablet TAKE (1) TABLET BY MOUTH ONCE DAILY.  Marland Kitchen polyethylene glycol (MIRALAX / GLYCOLAX) packet Take 17 g by mouth 2 (two) times daily. (Patient taking differently: Take 17 g by mouth daily as needed for mild constipation. )  . traMADol (ULTRAM) 50 MG tablet Take one tablet by mouth every 6 hours as needed for pain. Monitor for increased confusion, sedation, allergic reaction (Patient taking differently: Take 50 mg by mouth every 6 (six) hours as needed for moderate pain. Take one tablet by  mouth every 6 hours as needed for pain. Monitor for increased confusion, sedation, allergic reaction)  . benzonatate (TESSALON) 100 MG capsule Take 1 capsule (100 mg total) by mouth 2 (two) times daily as needed for cough. (Patient not taking: Reported on 12/18/2015)  . [DISCONTINUED] metoprolol (LOPRESSOR) 50 MG tablet TAKE (1) TABLET TWICE DAILY. (Patient not taking: Reported on 12/18/2015)  . [DISCONTINUED] NITROSTAT 0.4 MG SL tablet PLACE 1 TAB UNDER  TONGUE AS DIRECTED FOR CHEST PAIN. (Patient not taking: Reported on 12/18/2015)  . [DISCONTINUED] PARoxetine (PAXIL) 20 MG tablet TAKE (1) TABLET BY MOUTH ONCE DAILY. (Patient not taking: Reported on 12/18/2015)   No facility-administered encounter medications on file as of 12/18/2015.       Objective:   Physical Exam  Constitutional: She appears well-developed and well-nourished.  Cardiovascular: Normal rate.   Abdominal: Soft. Bowel sounds are normal.  Musculoskeletal:  Eversion a right fluid noted when she walks with her walker. In testing inversion and eversion muscles, they are approximately equal.          Assessment & Plan:  1. Incontinence of bowel Continue all medicines which could influence loose stools and try to get her on a bowel regimen with MiraLAX  2. Nocturnal enuresis Using diarrhetic sparingly as needed. No fluids after supper. Wear depends. Wake up at times when caregiver is awake at night to empty bladder. Check for infection but patient could not provide specimen today  Wardell Honour MD

## 2015-12-20 ENCOUNTER — Ambulatory Visit: Payer: Medicare Other | Admitting: Physical Therapy

## 2015-12-20 ENCOUNTER — Encounter: Payer: Self-pay | Admitting: Physical Therapy

## 2015-12-20 ENCOUNTER — Other Ambulatory Visit (INDEPENDENT_AMBULATORY_CARE_PROVIDER_SITE_OTHER): Payer: Medicare Other

## 2015-12-20 DIAGNOSIS — M25561 Pain in right knee: Secondary | ICD-10-CM | POA: Diagnosis not present

## 2015-12-20 DIAGNOSIS — M25661 Stiffness of right knee, not elsewhere classified: Secondary | ICD-10-CM

## 2015-12-20 DIAGNOSIS — R300 Dysuria: Secondary | ICD-10-CM

## 2015-12-20 LAB — POCT URINALYSIS DIPSTICK
BILIRUBIN UA: NEGATIVE
Glucose, UA: NEGATIVE
Ketones, UA: NEGATIVE
NITRITE UA: POSITIVE
PH UA: 5
PROTEIN UA: NEGATIVE
Spec Grav, UA: 1.015
Urobilinogen, UA: NEGATIVE

## 2015-12-20 LAB — POCT UA - MICROSCOPIC ONLY
Casts, Ur, LPF, POC: NEGATIVE
Crystals, Ur, HPF, POC: NEGATIVE
MUCUS UA: NEGATIVE
YEAST UA: NEGATIVE

## 2015-12-20 NOTE — Therapy (Signed)
[facility] [place], [place], [address] Phone: [phone]   Fax:  [phone]  Physical Therapy Treatment  Patient Details  Name: [name] MRN: [id number] Date of Birth: [birth year] Referring Provider: Paralee Cancel MD.  Encounter Date: 12/20/2015      PT End of Session - 12/20/15 0914    Visit Number 18   Number of Visits 24   Date for PT Re-Evaluation 01/12/16   PT Start Time 0901   PT Stop Time 0946   PT Time Calculation (min) 45 min   Activity Tolerance Patient tolerated treatment well   Behavior During Therapy [facility] for tasks assessed/performed      Past Medical History  Diagnosis Date  . Anemia   . Hypertension   . Iron deficiency anemia 05/27/2011  . CHF (congestive heart failure) ([place])     EF preserved Echo 2012  . 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 Carotid stenosis   . Renal insufficiency   . Furuncle of back, except buttock   . Vertigo     "when sugar gets low"  . CAD (coronary artery disease)   . Chronic kidney infection   . Arthritis     back, knees, and hips  . Myocardial infarction ([place])   . Balance problems   . Speech problem     from stroke  . Vision loss     left eye-"half blind"  . Hearing loss   . Shortness of breath dyspnea     with activity  . H/O seasonal allergies   . Pneumonia     hx of  . Asthma     with allergies  . Swallowing difficulty   . Diabetes mellitus     type 2  . Anxiety   . Depression   . GERD (gastroesophageal reflux disease)   . Dementia     "a little"    Past Surgical History  Procedure Laterality Date  . I & d of furuncle  April 2013  . Coronary artery bypass graft  2006  . Coronary angioplasty      prior to 2006 5 stents  . Total abdominal hysterectomy  ~82 years old    complete, with tumor removal  . Cholecystectomy    . Appendectomy      with  hysterectomy  . Cataract extraction Bilateral 5 years ago  . Total knee arthroplasty Right 08/13/2015    Procedure: RIGHT  TOTAL KNEE ARTHROPLASTY;  Surgeon: Paralee Cancel, MD;  Location: [facility];  Service: Orthopedics;  Laterality: Right;  . Knee surgery Right     There were no vitals filed for this visit.  Visit Diagnosis:  Right knee pain  Knee stiffness, right      Subjective Assessment - 12/20/15 0911    Subjective Daughter asked if we could watch patient walk and that ankle on same side as surgery was turning out. Patient states that she thinks she has always walked with her toes out. States that hips are sore but knee isn't too bad today and states that sometimes she staggers when she walks.   Limitations Walking   Patient Stated Goals Get out of pain.   Currently in Pain? No/denies            [facility] PT Assessment - 12/20/15 0001    Assessment   Medical Diagnosis Right total knee replacement.   Onset  Date/Surgical Date 08/13/15                     [facility] Adult PT Treatment/Exercise - 12/20/15 0001    Ambulation/Gait   Ambulation/Gait Yes   Ambulation/Gait Assistance 6: Modified independent (Device/Increase time)   Ambulation Distance (Feet) 165 Feet   Assistive device Rolling walker   Gait Pattern Step-through pattern;Decreased weight shift to right;Trunk flexed;Narrow base of support;Poor foot clearance - left;Poor foot clearance - right   Ambulation Surface Level;Indoor   Gait Comments Supervision required with mulitmodal cueing for foot clearance, B toe straight d/t toe out ambulation although L foot worse than R.   Knee/Hip Exercises: Aerobic   Nustep L6 x18 min   Knee/Hip Exercises: Seated   Long Arc Quad Strengthening;Right;3 sets;10 reps;Weights   Long Arc Quad Weight 5 lbs.   Knee/Hip Exercises: Supine   Bridges Limitations 2x10 reps   Bridges with [name] Squeeze Strengthening;Both;2 sets;10 reps  2# ball   Straight Leg Raises  Strengthening;Right;2 sets;10 reps  2#   Knee/Hip Exercises: Sidelying   Hip ABduction Strengthening;Right;2 sets;10 reps                  PT Short Term Goals - 10/14/15 1121    PT SHORT TERM GOAL #1   Title Ind with an initial HEP.   Time 3   Period Weeks   Status Achieved   PT SHORT TERM GOAL #2   Title Full active right knee extension.   Time 3   Period Weeks   Status Achieved  R knee ext 0 deg 10/14/2015           PT Long Term Goals - 12/03/15 1114    PT LONG TERM GOAL #1   Title Ind with an advanced HEP.   Time 6   Period Weeks   Status Achieved   PT LONG TERM GOAL #2   Title Right hip and knee strength= 5/5.   Time 6   Period Weeks   Status Partially Met   PT LONG TERM GOAL #3   Title Perform ADL's with pain not > 3/10.   Time 6   Period Weeks   Status Achieved               Plan - 12/20/15 1258    Clinical Impression Statement Patient tolerated today's treatment fairly well with only complaint of hip discomfort following NuStep. Gait was assessed with rolling walker at daughter's comment about foot moving out while patient was ambulating. Patient required multimodal cues to keep toes neutral during ambulation and to increase foot clearance to avoid staggering that patient reported. Patient would correct toe positioning momentarily but would revert back to toe out with L>R. Completed exercises fairly well although fatigue was observed at the end of the exercises. Patient reported "a little bit" of R knee stiffness following today's treatment.   Pt will benefit from skilled therapeutic intervention in order to improve on the following deficits Pain;Decreased activity tolerance;Increased edema;Decreased strength;Decreased range of motion   Rehab Potential Good   PT Frequency 3x / week   PT Duration 4 weeks   PT Treatment/Interventions ADLs/Self Care Home Management;Electrical Stimulation;Cryotherapy;Therapeutic exercise;Therapeutic  activities;Patient/family education;Manual techniques;Vasopneumatic Device   PT Next Visit Plan Continue TKE, SLR, hip flexion, ABD and bridging as tolerated. Monitor R SIJ pain. and RT hip control        PT Home Exercise Plan bridging, SLR   Consulted and Agree with Plan of Care  Patient        Problem List Patient Active Problem List   Diagnosis Date Noted  . UTI (lower urinary tract infection) 11/12/2015  . DM hyperosmolarity type II, uncontrolled ([place]) 11/12/2015  . Dementia   . Encephalopathy, metabolic 58/34/6219  . S/P right TKA 08/13/2015  . S/P knee replacement 08/13/2015  . Elevated troponin 03/06/2015  . Fever   . History of diabetes mellitus   . Left buttock pain   . Hyperlipidemia 01/11/2015  . GAD (generalized anxiety disorder) 01/11/2015  . Depression 01/11/2015  . GERD (gastroesophageal reflux disease) 01/11/2015  . Osteopenia 07/13/2014  . Obesity (BMI 30-39.9) 06/19/2014  . Carotid stenosis   . Anemia of chronic disease 12/25/2011  . Iron deficiency anemia 05/27/2011  . Diastolic CHF, chronic ([place]) 04/02/2010  . Type 2 diabetes mellitus with insulin therapy ([place]) 10/08/2009  . DYSLIPIDEMIA 10/08/2009  . Essential hypertension 10/08/2009  . Coronary atherosclerosis 10/08/2009  . CAROTID STENOSIS 10/08/2009    Wynelle Fanny, PTA 12/20/2015, 1:05 PM  [facility] [address] [place], [place], [address] Phone: [phone]   Fax:  [phone]  Name: [name] MRN: [id number] Date of Birth: 1931/09/26

## 2015-12-23 ENCOUNTER — Ambulatory Visit: Payer: Medicare Other | Admitting: Physical Therapy

## 2015-12-23 ENCOUNTER — Encounter: Payer: Self-pay | Admitting: Physical Therapy

## 2015-12-23 ENCOUNTER — Telehealth: Payer: Self-pay | Admitting: *Deleted

## 2015-12-23 DIAGNOSIS — M25561 Pain in right knee: Secondary | ICD-10-CM

## 2015-12-23 DIAGNOSIS — M25661 Stiffness of right knee, not elsewhere classified: Secondary | ICD-10-CM | POA: Diagnosis not present

## 2015-12-23 MED ORDER — SULFAMETHOXAZOLE-TRIMETHOPRIM 800-160 MG PO TABS: 1.0000 | ORAL_TABLET | Freq: Two times a day (BID) | ORAL | Status: DC

## 2015-12-23 NOTE — Therapy (Addendum)
[facility] [place], [place], [address] Phone: [phone]   Fax:  [phone]  Physical Therapy Treatment  Patient Details  Name: [name] MRN: [id number] Date of Birth: Jul 14, 1931 Referring Provider: Paralee Cancel MD.  Encounter Date: 12/23/2015      PT End of Session - 12/23/15 0825    Visit Number 19   Number of Visits 24   Date for PT Re-Evaluation 01/12/16   PT Start Time 0817   PT Stop Time 0859   PT Time Calculation (min) 42 min   Activity Tolerance Patient tolerated treatment well   Behavior During Therapy [facility] for tasks assessed/performed      Past Medical History  Diagnosis Date  . Anemia   . Hypertension   . Iron deficiency anemia 05/27/2011  . CHF (congestive heart failure) ([place])     EF preserved Echo 2012  . 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 Carotid stenosis   . Renal insufficiency   . Furuncle of back, except buttock   . Vertigo     "when sugar gets low"  . CAD (coronary artery disease)   . Chronic kidney infection   . Arthritis     back, knees, and hips  . Myocardial infarction ([place])   . Balance problems   . Speech problem     from stroke  . Vision loss     left eye-"half blind"  . Hearing loss   . Shortness of breath dyspnea     with activity  . H/O seasonal allergies   . Pneumonia     hx of  . Asthma     with allergies  . Swallowing difficulty   . Diabetes mellitus     type 2  . Anxiety   . Depression   . GERD (gastroesophageal reflux disease)   . Dementia     "a little"    Past Surgical History  Procedure Laterality Date  . I & d of furuncle  April 2013  . Coronary artery bypass graft  2006  . Coronary angioplasty      prior to 2006 5 stents  . Total abdominal hysterectomy  ~82 years old    complete, with tumor removal  . Cholecystectomy    . Appendectomy      with  hysterectomy  . Cataract extraction Bilateral 5 years ago  . Total knee arthroplasty Right 08/13/2015    Procedure: RIGHT  TOTAL KNEE ARTHROPLASTY;  Surgeon: Paralee Cancel, MD;  Location: [facility];  Service: Orthopedics;  Laterality: Right;  . Knee surgery Right     There were no vitals filed for this visit.  Visit Diagnosis:  Right knee pain  Knee stiffness, right      Subjective Assessment - 12/23/15 0824    Subjective "I'm dragging today." Reports that her hips are bothering her but knee is not hurting today. Stated that she thought she had run all night in her sleep due to LE fatigue.   Limitations Walking   Patient Stated Goals Get out of pain.   Currently in Pain? No/denies            [facility] PT Assessment - 12/23/15 0001    Assessment   Medical Diagnosis Right total knee replacement.   Onset Date/Surgical Date 08/13/15   ROM / Strength   AROM / PROM / Strength Strength   Strength  Overall Strength Deficits   Strength Assessment Site Knee;Hip   Right/Left Hip Right   Right Hip Flexion 4/5   Right Hip ABduction 4+/5   Right/Left Knee Right   Right Knee Flexion 4/5   Right Knee Extension 4+/5                     [facility] Adult PT Treatment/Exercise - 12/23/15 0001    Knee/Hip Exercises: Aerobic   Nustep L6 x16 min   Knee/Hip Exercises: Standing   Lateral Step Up Right;2 sets;10 reps;Hand Hold: 2;Step Height: 4"   Forward Step Up Right;2 sets;10 reps;Hand Hold: 2;Step Height: 4"   Knee/Hip Exercises: Seated   Long Arc Quad Strengthening;Right;3 sets;10 reps;Weights   Long Arc Quad Weight 5 lbs.   Knee/Hip Exercises: Supine   Bridges Limitations 2x10 reps   Bridges with Greig Right Strengthening;Both;2 sets;10 reps  4# ball   Straight Leg Raises Strengthening;Right;3 sets;10 reps;Other (comment)   Straight Leg Raises Limitations Unable to complete with weights today secondary to RLE fatigue   Knee/Hip Exercises: Sidelying   Hip ABduction  Strengthening;Right;2 sets;10 reps                  PT Short Term Goals - 10/14/15 1121    PT SHORT TERM GOAL #1   Title Ind with an initial HEP.   Time 3   Period Weeks   Status Achieved   PT SHORT TERM GOAL #2   Title Full active right knee extension.   Time 3   Period Weeks   Status Achieved  R knee ext 0 deg 10/14/2015           PT Long Term Goals - 12/03/15 1114    PT LONG TERM GOAL #1   Title Ind with an advanced HEP.   Time 6   Period Weeks   Status Achieved   PT LONG TERM GOAL #2   Title Right hip and knee strength= 5/5.   Time 6   Period Weeks   Status Partially Met   PT LONG TERM GOAL #3   Title Perform ADL's with pain not > 3/10.   Time 6   Period Weeks   Status Achieved               Plan - 12/23/15 0906    Clinical Impression Statement Patient tolerated today's treatment fairly well with only complaint of hip and low back discomfort following today's treatment. Continues to use rolling walker for ambulation at this time. Patient was able to complete exercises with varying amounts of multimodal cueing for exercise technique or corrections to form. Patient required small rest breaks between sets of exercise due to fatigue. Unable to complete SLR with weights secondary to RLE fatigue and patient unable to raise RLE from table with 2# weight. R hip and knee MMT assessed today ranging from 4+/5 to 4/5.    Pt will benefit from skilled therapeutic intervention in order to improve on the following deficits Pain;Decreased activity tolerance;Increased edema;Decreased strength;Decreased range of motion   Rehab Potential Good   PT Frequency 3x / week   PT Duration 4 weeks   PT Treatment/Interventions ADLs/Self Care Home Management;Electrical Stimulation;Cryotherapy;Therapeutic exercise;Therapeutic activities;Patient/family education;Manual techniques;Vasopneumatic Device   PT Next Visit Plan Continue TKE, SLR, hip flexion, ABD and bridging as  tolerated. Monitor R SIJ pain. and RT hip control        PT Home Exercise Plan bridging, SLR   Consulted and Agree with  Plan of Care Patient        Problem List Patient Active Problem List   Diagnosis Date Noted  . UTI (lower urinary tract infection) 11/12/2015  . DM hyperosmolarity type II, uncontrolled ([place]) 11/12/2015  . Dementia   . Encephalopathy, metabolic 28/76/8115  . S/P right TKA 08/13/2015  . S/P knee replacement 08/13/2015  . Elevated troponin 03/06/2015  . Fever   . History of diabetes mellitus   . Left buttock pain   . Hyperlipidemia 01/11/2015  . GAD (generalized anxiety disorder) 01/11/2015  . Depression 01/11/2015  . GERD (gastroesophageal reflux disease) 01/11/2015  . Osteopenia 07/13/2014  . Obesity (BMI 30-39.9) 06/19/2014  . Carotid stenosis   . Anemia of chronic disease 12/25/2011  . Iron deficiency anemia 05/27/2011  . Diastolic CHF, chronic ([place]) 04/02/2010  . Type 2 diabetes mellitus with insulin therapy ([place]) 10/08/2009  . DYSLIPIDEMIA 10/08/2009  . Essential hypertension 10/08/2009  . Coronary atherosclerosis 10/08/2009  . CAROTID STENOSIS 10/08/2009    Wynelle Fanny, PTA 12/23/2015, 9:14 AM  [facility] [address] [place], [place], [address] Phone: [phone]   Fax:  [phone]  Name: KMYA PLACIDE MRN: [id number] Date of Birth: [birth year]

## 2015-12-23 NOTE — Telephone Encounter (Signed)
Patient was seen on 1/11 by Sabra Heck and he ordered urine. I spoke with Dr. Warrick Parisian and he states to start her on Bactrim twice a day for 7 days. Patients daughter Ailene aware of result and rx sent to pharmacy.

## 2015-12-24 ENCOUNTER — Ambulatory Visit: Payer: Medicare Other | Admitting: Physical Therapy

## 2015-12-24 ENCOUNTER — Encounter: Payer: Self-pay | Admitting: Physical Therapy

## 2015-12-24 DIAGNOSIS — M25561 Pain in right knee: Secondary | ICD-10-CM

## 2015-12-24 DIAGNOSIS — M25661 Stiffness of right knee, not elsewhere classified: Secondary | ICD-10-CM

## 2015-12-24 NOTE — Therapy (Signed)
[facility] [place], [place], [address] Phone: [phone]   Fax:  [phone]  Physical Therapy Treatment  Patient Details  Name: [name] MRN: [id number] Date of Birth: 1931/03/30 Referring Provider: Paralee Cancel MD.  Encounter Date: 12/24/2015      PT End of Session - 12/24/15 1457    Visit Number 20   Number of Visits 24   Date for PT Re-Evaluation 01/12/16   PT Start Time 6962   PT Stop Time 1517   PT Time Calculation (min) 40 min   Activity Tolerance Patient tolerated treatment well   Behavior During Therapy [facility] for tasks assessed/performed      Past Medical History  Diagnosis Date  . Anemia   . Hypertension   . Iron deficiency anemia 05/27/2011  . CHF (congestive heart failure) ([place])     EF preserved Echo 2012  . 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 Carotid stenosis   . Renal insufficiency   . Furuncle of back, except buttock   . Vertigo     "when sugar gets low"  . CAD (coronary artery disease)   . Chronic kidney infection   . Arthritis     back, knees, and hips  . Myocardial infarction ([name])   . Balance problems   . Speech problem     from stroke  . Vision loss     left eye-"half blind"  . Hearing loss   . Shortness of breath dyspnea     with activity  . H/O seasonal allergies   . Pneumonia     hx of  . Asthma     with allergies  . Swallowing difficulty   . Diabetes mellitus     type 2  . Anxiety   . Depression   . GERD (gastroesophageal reflux disease)   . Dementia     "a little"    Past Surgical History  Procedure Laterality Date  . I & d of furuncle  April 2013  . Coronary artery bypass graft  2006  . Coronary angioplasty      prior to 2006 5 stents  . Total abdominal hysterectomy  ~82 years old    complete, with tumor removal  . Cholecystectomy    . Appendectomy      with  hysterectomy  . Cataract extraction Bilateral 5 years ago  . Total knee arthroplasty Right 08/13/2015    Procedure: RIGHT  TOTAL KNEE ARTHROPLASTY;  Surgeon: Paralee Cancel, MD;  Location: [facility];  Service: Orthopedics;  Laterality: Right;  . Knee surgery Right     There were no vitals filed for this visit.  Visit Diagnosis:  Right knee pain  Knee stiffness, right      Subjective Assessment - 12/24/15 1456    Subjective Patient reports that she fell at home on her L side but wasn't hurt per patient report. States that she fell because she "got swimmy headed."   Limitations Walking   Patient Stated Goals Get out of pain.   Currently in Pain? No/denies            [facility] PT Assessment - 12/24/15 0001    Assessment   Medical Diagnosis Right total knee replacement.   Onset Date/Surgical Date 08/13/15                     [facility] Adult PT  Treatment/Exercise - 12/24/15 0001    Knee/Hip Exercises: Aerobic   Nustep L6 x16 min   Knee/Hip Exercises: Standing   Hip Flexion AROM;Both;1 set;10 reps;Knee bent   Forward Step Up Right;2 sets;10 reps;Hand Hold: 2;Step Height: 6"   Knee/Hip Exercises: Seated   Long Arc Quad Strengthening;Right;3 sets;10 reps;Weights   Long Arc Quad Weight 5 lbs.   Knee/Hip Exercises: Supine   Straight Leg Raises Strengthening;Right;3 sets;10 reps   Knee/Hip Exercises: Sidelying   Hip ABduction Strengthening;Right;2 sets;10 reps                  PT Short Term Goals - 10/14/15 1121    PT SHORT TERM GOAL #1   Title Ind with an initial HEP.   Time 3   Period Weeks   Status Achieved   PT SHORT TERM GOAL #2   Title Full active right knee extension.   Time 3   Period Weeks   Status Achieved  R knee ext 0 deg 10/14/2015           PT Long Term Goals - 12/03/15 1114    PT LONG TERM GOAL #1   Title Ind with an advanced HEP.   Time 6   Period Weeks   Status Achieved   PT LONG TERM GOAL #2   Title Right hip and knee strength=  5/5.   Time 6   Period Weeks   Status Partially Met   PT LONG TERM GOAL #3   Title Perform ADL's with pain not > 3/10.   Time 6   Period Weeks   Status Achieved               Plan - 12/24/15 1523    Clinical Impression Statement Patient tolerated today's treatment fairly well today although she fatigued quickly with exercises today. Continues to require rest breaks with the exercises secondary to fatigue which may be from patient's other comorbidities. Required increased multimodal cueing with standing marching for increased B hip and knee flexion and could only tolerate 10 repititions for each LE due to fatigue. 6" step for forward step was utilized today and patient fatigued with that exercise as well but able to complete the 20 repititions.    Pt will benefit from skilled therapeutic intervention in order to improve on the following deficits Pain;Decreased activity tolerance;Increased edema;Decreased strength;Decreased range of motion   Rehab Potential Good   PT Frequency 3x / week   PT Duration 4 weeks   PT Treatment/Interventions ADLs/Self Care Home Management;Electrical Stimulation;Cryotherapy;Therapeutic exercise;Therapeutic activities;Patient/family education;Manual techniques;Vasopneumatic Device   PT Next Visit Plan Continue with R hip and knee strengthening per MPT POC.   PT Home Exercise Plan bridging, SLR   Consulted and Agree with Plan of Care Patient        Problem List Patient Active Problem List   Diagnosis Date Noted  . UTI (lower urinary tract infection) 11/12/2015  . DM hyperosmolarity type II, uncontrolled ([name]) 11/12/2015  . Dementia   . Encephalopathy, metabolic 38/25/0539  . S/P right TKA 08/13/2015  . S/P knee replacement 08/13/2015  . Elevated troponin 03/06/2015  . Fever   . History of diabetes mellitus   . Left buttock pain   . Hyperlipidemia 01/11/2015  . GAD (generalized anxiety disorder) 01/11/2015  . Depression 01/11/2015  . GERD  (gastroesophageal reflux disease) 01/11/2015  . Osteopenia 07/13/2014  . Obesity (BMI 30-39.9) 06/19/2014  . Carotid stenosis   . Anemia of chronic disease 12/25/2011  . Iron  deficiency anemia 05/27/2011  . Diastolic CHF, chronic ([name]) 04/02/2010  . Type 2 diabetes mellitus with insulin therapy ([place]) 10/08/2009  . DYSLIPIDEMIA 10/08/2009  . Essential hypertension 10/08/2009  . Coronary atherosclerosis 10/08/2009  . CAROTID STENOSIS 10/08/2009    Wynelle Fanny, PTA 12/24/2015, 3:29 PM  [facility] [place], [place], [address] Phone: [phone]   Fax:  [phone]  Name: TIEGAN JAMBOR MRN: [id number] Date of Birth: 02-12-1931

## 2015-12-25 LAB — URINE CULTURE

## 2015-12-26 ENCOUNTER — Telehealth: Payer: Self-pay | Admitting: *Deleted

## 2015-12-26 MED ORDER — NITROFURANTOIN MONOHYD MACRO 100 MG PO CAPS: 100.0000 mg | ORAL_CAPSULE | Freq: Two times a day (BID) | ORAL | Status: DC

## 2015-12-26 NOTE — Telephone Encounter (Signed)
Per Dr. Sabra Heck urine cx was resistant to bactrim abx which was rx'd the other day. lmovm needing to change abx, which was for macrobid & has already been sent in to [facility]

## 2015-12-26 NOTE — Telephone Encounter (Signed)
Gave patient's daughter the message. She verbalized understanding.

## 2015-12-26 NOTE — Telephone Encounter (Signed)
Ms. Grandpre aware of  new antibiotic.  Left message on vm of her daughter also.

## 2015-12-27 ENCOUNTER — Ambulatory Visit (HOSPITAL_COMMUNITY)
Admission: RE | Admit: 2015-12-27 | Discharge: 2015-12-27 | Disposition: A | Discharge status: Home / Self Care | Payer: Medicare Other | Source: Ambulatory Visit | Attending: Urology | Admitting: Urology

## 2015-12-27 DIAGNOSIS — I10 Essential (primary) hypertension: Secondary | ICD-10-CM | POA: Insufficient documentation

## 2015-12-27 DIAGNOSIS — I6523 Occlusion and stenosis of bilateral carotid arteries: Secondary | ICD-10-CM | POA: Diagnosis not present

## 2015-12-27 DIAGNOSIS — E119 Type 2 diabetes mellitus without complications: Secondary | ICD-10-CM | POA: Diagnosis not present

## 2015-12-30 ENCOUNTER — Ambulatory Visit: Payer: Medicare Other | Admitting: Physical Therapy

## 2015-12-30 ENCOUNTER — Encounter: Payer: Self-pay | Admitting: Physical Therapy

## 2015-12-30 DIAGNOSIS — M25561 Pain in right knee: Secondary | ICD-10-CM

## 2015-12-30 DIAGNOSIS — M25661 Stiffness of right knee, not elsewhere classified: Secondary | ICD-10-CM | POA: Diagnosis not present

## 2015-12-30 NOTE — Therapy (Signed)
[facility] [place], [place], [address] Phone: [phone]   Fax:  [phone]  Physical Therapy Treatment  Patient Details  Name: [name] MRN: [id number] Date of Birth: 05/25/1931 Referring Provider: Paralee Cancel MD.  Encounter Date: 12/30/2015      PT End of Session - 12/30/15 0914    Visit Number 21   Number of Visits 24   Date for PT Re-Evaluation 01/12/16   PT Start Time 0903   PT Stop Time 0941   PT Time Calculation (min) 38 min   Activity Tolerance Patient tolerated treatment well   Behavior During Therapy [facility] for tasks assessed/performed      Past Medical History  Diagnosis Date  . Anemia   . Hypertension   . Iron deficiency anemia 05/27/2011  . CHF (congestive heart failure) ([place])     EF preserved Echo 2012  . 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 Carotid stenosis   . Renal insufficiency   . Furuncle of back, except buttock   . Vertigo     "when sugar gets low"  . CAD (coronary artery disease)   . Chronic kidney infection   . Arthritis     back, knees, and hips  . Myocardial infarction ([place])   . Balance problems   . Speech problem     from stroke  . Vision loss     left eye-"half blind"  . Hearing loss   . Shortness of breath dyspnea     with activity  . H/O seasonal allergies   . Pneumonia     hx of  . Asthma     with allergies  . Swallowing difficulty   . Diabetes mellitus     type 2  . Anxiety   . Depression   . GERD (gastroesophageal reflux disease)   . Dementia     "a little"    Past Surgical History  Procedure Laterality Date  . I & d of furuncle  April 2013  . Coronary artery bypass graft  2006  . Coronary angioplasty      prior to 2006 5 stents  . Total abdominal hysterectomy  ~82 years old    complete, with tumor removal  . Cholecystectomy    . Appendectomy      with  hysterectomy  . Cataract extraction Bilateral 5 years ago  . Total knee arthroplasty Right 08/13/2015    Procedure: RIGHT  TOTAL KNEE ARTHROPLASTY;  Surgeon: Paralee Cancel, MD;  Location: [facility];  Service: Orthopedics;  Laterality: Right;  . Knee surgery Right     There were no vitals filed for this visit.  Visit Diagnosis:  Right knee pain  Knee stiffness, right      Subjective Assessment - 12/30/15 0914    Subjective Reports that her hips are hurting today.   Limitations Walking   Patient Stated Goals Get out of pain.   Currently in Pain? No/denies            [facility] PT Assessment - 12/30/15 0001    Assessment   Medical Diagnosis Right total knee replacement.   Onset Date/Surgical Date 08/13/15                     [facility] Adult PT Treatment/Exercise - 12/30/15 0001    Knee/Hip Exercises: Aerobic   Nustep L6 x16 min   Knee/Hip  Exercises: Standing   Lateral Step Up Right;2 sets;10 reps;Hand Hold: 2;Step Height: 4"   Forward Step Up Right;2 sets;10 reps;Hand Hold: 2;Step Height: 6"   Other Standing Knee Exercises R standing HS curl 4# 2x10 reps   Knee/Hip Exercises: Seated   Long Arc Quad Strengthening;Right;3 sets;10 reps;Weights   Long Arc Quad Weight 5 lbs.   Knee/Hip Exercises: Supine   Bridges with [name] Squeeze Strengthening;Both;2 sets;10 reps  4# ball   Straight Leg Raises Strengthening;Right;3 sets;10 reps   Knee/Hip Exercises: Sidelying   Hip ABduction Strengthening;Right;3 sets;10 reps                  PT Short Term Goals - 10/14/15 1121    PT SHORT TERM GOAL #1   Title Ind with an initial HEP.   Time 3   Period Weeks   Status Achieved   PT SHORT TERM GOAL #2   Title Full active right knee extension.   Time 3   Period Weeks   Status Achieved  R knee ext 0 deg 10/14/2015           PT Long Term Goals - 12/03/15 1114    PT LONG TERM GOAL #1   Title Ind with an advanced HEP.   Time 6   Period Weeks   Status Achieved   PT  LONG TERM GOAL #2   Title Right hip and knee strength= 5/5.   Time 6   Period Weeks   Status Partially Met   PT LONG TERM GOAL #3   Title Perform ADL's with pain not > 3/10.   Time 6   Period Weeks   Status Achieved               Plan - 12/30/15 0941    Clinical Impression Statement Patient tolerated today's treatment fairly well although she reported fatigue following each exercise today. Continues to require short rest breaks following each set of exercises. Fatigue may be related to patient's other comorbidities. Patient continues to ambulate with rolling walker at this time. Patient denied R knee pain following today's treatment only soreness in B hips.   Pt will benefit from skilled therapeutic intervention in order to improve on the following deficits Pain;Decreased activity tolerance;Increased edema;Decreased strength;Decreased range of motion   Rehab Potential Good   PT Frequency 3x / week   PT Duration 4 weeks   PT Treatment/Interventions ADLs/Self Care Home Management;Electrical Stimulation;Cryotherapy;Therapeutic exercise;Therapeutic activities;Patient/family education;Manual techniques;Vasopneumatic Device   PT Next Visit Plan Continue with R hip and knee strengthening per MPT POC.   PT Home Exercise Plan bridging, SLR   Consulted and Agree with Plan of Care Patient        Problem List Patient Active Problem List   Diagnosis Date Noted  . UTI (lower urinary tract infection) 11/12/2015  . DM hyperosmolarity type II, uncontrolled ([place]) 11/12/2015  . Dementia   . Encephalopathy, metabolic 62/22/9798  . S/P right TKA 08/13/2015  . S/P knee replacement 08/13/2015  . Elevated troponin 03/06/2015  . Fever   . History of diabetes mellitus   . Left buttock pain   . Hyperlipidemia 01/11/2015  . GAD (generalized anxiety disorder) 01/11/2015  . Depression 01/11/2015  . GERD (gastroesophageal reflux disease) 01/11/2015  . Osteopenia 07/13/2014  . Obesity (BMI  30-39.9) 06/19/2014  . Carotid stenosis   . Anemia of chronic disease 12/25/2011  . Iron deficiency anemia 05/27/2011  . Diastolic CHF, chronic ([place]) 04/02/2010  . Type 2 diabetes mellitus  with insulin therapy ([place]) 10/08/2009  . DYSLIPIDEMIA 10/08/2009  . Essential hypertension 10/08/2009  . Coronary atherosclerosis 10/08/2009  . CAROTID STENOSIS 10/08/2009    Wynelle Fanny, PTA 12/30/2015, 9:43 AM  [facility] [address] [place], [place], [address] Phone: [phone]   Fax:  [phone]  Name: [name] MRN: [id number] Date of Birth: 01-10-1931

## 2016-01-03 ENCOUNTER — Ambulatory Visit: Payer: Medicare Other | Admitting: *Deleted

## 2016-01-03 DIAGNOSIS — M25561 Pain in right knee: Secondary | ICD-10-CM | POA: Diagnosis not present

## 2016-01-03 DIAGNOSIS — M25661 Stiffness of right knee, not elsewhere classified: Secondary | ICD-10-CM | POA: Diagnosis not present

## 2016-01-03 NOTE — Therapy (Signed)
[facility] [place], [place], [address] Phone: [phone]   Fax:  [phone]  Physical Therapy Treatment  Patient Details  Name: [name] MRN: [id number] Date of Birth: [birth year] Referring Provider: Paralee Cancel MD.  Encounter Date: 01/03/2016      PT End of Session - 01/03/16 0942    Visit Number 22   Number of Visits 24   Date for PT Re-Evaluation 01/12/16   PT Start Time 0900   PT Stop Time 0949   PT Time Calculation (min) 49 min      Past Medical History  Diagnosis Date  . Anemia   . Hypertension   . Iron deficiency anemia 05/27/2011  . CHF (congestive heart failure) ([place])     EF preserved Echo 2012  . 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 Carotid stenosis   . Renal insufficiency   . Furuncle of back, except buttock   . Vertigo     "when sugar gets low"  . CAD (coronary artery disease)   . Chronic kidney infection   . Arthritis     back, knees, and hips  . Myocardial infarction ([place])   . Balance problems   . Speech problem     from stroke  . Vision loss     left eye-"half blind"  . Hearing loss   . Shortness of breath dyspnea     with activity  . H/O seasonal allergies   . Pneumonia     hx of  . Asthma     with allergies  . Swallowing difficulty   . Diabetes mellitus     type 2  . Anxiety   . Depression   . GERD (gastroesophageal reflux disease)   . Dementia     "a little"    Past Surgical History  Procedure Laterality Date  . I & d of furuncle  April 2013  . Coronary artery bypass graft  2006  . Coronary angioplasty      prior to 2006 5 stents  . Total abdominal hysterectomy  ~82 years old    complete, with tumor removal  . Cholecystectomy    . Appendectomy      with hysterectomy  . Cataract extraction Bilateral 5 years ago  . Total knee arthroplasty Right 08/13/2015    Procedure:  RIGHT  TOTAL KNEE ARTHROPLASTY;  Surgeon: Paralee Cancel, MD;  Location: [facility];  Service: Orthopedics;  Laterality: Right;  . Knee surgery Right     There were no vitals filed for this visit.  Visit Diagnosis:  Right knee pain  Knee stiffness, right                       [facility] Adult PT Treatment/Exercise - 01/03/16 0001    Knee/Hip Exercises: Aerobic   Nustep L6 x16 min   Knee/Hip Exercises: Standing   Lateral Step Up Right;2 sets;10 reps;Hand Hold: 2;Step Height: 4"  SBA   Forward Step Up Right;2 sets;10 reps;Hand Hold: 2;Step Height: 6"  SBA   Other Standing Knee Exercises R standing HS curl 4# 3x10 reps   Knee/Hip Exercises: Seated   Long Arc Quad Strengthening;Right;3 sets;10 reps;Weights   Long Arc Quad Weight 5 lbs.   Knee/Hip Exercises: Supine   Bridges with Greig Right Strengthening;Both;2 sets;10 reps  4# ball   Straight Leg Raises Strengthening;Right;3 sets;10 reps  PT Short Term Goals - 10/14/15 1121    PT SHORT TERM GOAL #1   Title Ind with an initial HEP.   Time 3   Period Weeks   Status Achieved   PT SHORT TERM GOAL #2   Title Full active right knee extension.   Time 3   Period Weeks   Status Achieved  R knee ext 0 deg 10/14/2015           PT Long Term Goals - 12/03/15 1114    PT LONG TERM GOAL #1   Title Ind with an advanced HEP.   Time 6   Period Weeks   Status Achieved   PT LONG TERM GOAL #2   Title Right hip and knee strength= 5/5.   Time 6   Period Weeks   Status Partially Met   PT LONG TERM GOAL #3   Title Perform ADL's with pain not > 3/10.   Time 6   Period Weeks   Status Achieved               Plan - 01/03/16 0943    Clinical Impression Statement Pt did fairly well with Rx today and was able to complete all exs and act.'s with no complaints of pain. Pt's knee strength is good, but she still has a strength deficit for RT hip. Abduction was 3+/5 and was unable to meet strength     Pt will benefit from skilled therapeutic intervention in order to improve on the following deficits Pain;Decreased activity tolerance;Increased edema;Decreased strength;Decreased range of motion   Rehab Potential Good   PT Frequency 3x / week   PT Duration 4 weeks   PT Treatment/Interventions ADLs/Self Care Home Management;Electrical Stimulation;Cryotherapy;Therapeutic exercise;Therapeutic activities;Patient/family education;Manual techniques;Vasopneumatic Device   PT Next Visit Plan Continue with R hip and knee strengthening per MPT POC.   PT Home Exercise Plan bridging, SLR   Consulted and Agree with Plan of Care Patient        Problem List Patient Active Problem List   Diagnosis Date Noted  . UTI (lower urinary tract infection) 11/12/2015  . DM hyperosmolarity type II, uncontrolled ([place]) 11/12/2015  . Dementia   . Encephalopathy, metabolic 34/35/6861  . S/P right TKA 08/13/2015  . S/P knee replacement 08/13/2015  . Elevated troponin 03/06/2015  . Fever   . History of diabetes mellitus   . Left buttock pain   . Hyperlipidemia 01/11/2015  . GAD (generalized anxiety disorder) 01/11/2015  . Depression 01/11/2015  . GERD (gastroesophageal reflux disease) 01/11/2015  . Osteopenia 07/13/2014  . Obesity (BMI 30-39.9) 06/19/2014  . Carotid stenosis   . Anemia of chronic disease 12/25/2011  . Iron deficiency anemia 05/27/2011  . Diastolic CHF, chronic ([place]) 04/02/2010  . Type 2 diabetes mellitus with insulin therapy ([place]) 10/08/2009  . DYSLIPIDEMIA 10/08/2009  . Essential hypertension 10/08/2009  . Coronary atherosclerosis 10/08/2009  . CAROTID STENOSIS 10/08/2009    RAMSEUR,CHRIS, PTA 01/03/2016, 10:12 AM  [facility] [place], [place], [address] Phone: [phone]   Fax:  [phone]  Name: [name] MRN: [id number] Date of Birth: [birth year]

## 2016-01-06 ENCOUNTER — Encounter: Payer: Self-pay | Admitting: Pediatrics

## 2016-01-06 ENCOUNTER — Ambulatory Visit (INDEPENDENT_AMBULATORY_CARE_PROVIDER_SITE_OTHER): Payer: Medicare Other | Admitting: Pediatrics

## 2016-01-06 ENCOUNTER — Ambulatory Visit: Payer: Medicare Other | Admitting: Physical Therapy

## 2016-01-06 VITALS — BP 150/68 | HR 58 | Temp 97.0°F | Ht 66.0 in | Wt 169.0 lb

## 2016-01-06 DIAGNOSIS — L723 Sebaceous cyst: Secondary | ICD-10-CM | POA: Diagnosis not present

## 2016-01-06 DIAGNOSIS — I10 Essential (primary) hypertension: Secondary | ICD-10-CM | POA: Diagnosis not present

## 2016-01-06 DIAGNOSIS — K5900 Constipation, unspecified: Secondary | ICD-10-CM | POA: Diagnosis not present

## 2016-01-06 DIAGNOSIS — M25561 Pain in right knee: Secondary | ICD-10-CM | POA: Diagnosis not present

## 2016-01-06 DIAGNOSIS — L089 Local infection of the skin and subcutaneous tissue, unspecified: Secondary | ICD-10-CM

## 2016-01-06 DIAGNOSIS — M25661 Stiffness of right knee, not elsewhere classified: Secondary | ICD-10-CM

## 2016-01-06 MED ORDER — MUPIROCIN 2 % EX OINT: 1.0000 "application " | TOPICAL_OINTMENT | Freq: Two times a day (BID) | CUTANEOUS | Status: DC

## 2016-01-06 NOTE — Patient Instructions (Addendum)
Constipation: Docusate 100mg  twice a day Metamucil or psyillium fiber twice a day, at breakfast and after lunch Miralax 1/2 to 1 capful as needed for constipation. Take again after lunch as needed if no stool Goal of 1-2 soft stools every day  Skin: Antibiotic ointment twice a day on cyst until improved Hold warm compresses to back for 5-10 minutes 3 times a day

## 2016-01-06 NOTE — Progress Notes (Signed)
Subjective:    [name], female    DOB: 02/05/1931, 82 y.o.   MRN: [id number]  CC: Recurrent Skin Infections   HPI: [name] is a 82 y.o. female presenting for Recurrent Skin Infections   Yesterday noticed she had a sore spot on her back when she leaned against a chair Daughter lives with her, noted that she had a draining red area on back No tenderness now Has had this same area drain twice before Once it was red and puffy and required antibiotics  Also with constipation Has been several days since last stool Daughter hesitant to give her miralax because causes diarrhea No fevers, no abdominal pain, just feels "full" Straining when she does pass stool  Depression screen [facility] 2/9 01/06/2016 11/28/2015 11/22/2015 04/16/2015 03/21/2015  Decreased Interest 1 1 0 0 0  Down, Depressed, Hopeless 2 3 1  0 0  PHQ - 2 Score 3 4 1  0 0  Altered sleeping 1 1 - - -  Tired, decreased energy 1 3 - - -  Change in appetite 3 0 - - -  Feeling bad or failure about yourself  0 3 - - -  Trouble concentrating 3 0 - - -  Moving slowly or fidgety/restless 0 2 - - -  Suicidal thoughts 0 0 - - -  PHQ-9 Score 11 13 - - -     Relevant past medical, surgical, family and social history reviewed and updated as indicated. Interim medical history since our last visit reviewed. Allergies and medications reviewed and updated.    ROS: Per HPI unless specifically indicated above  History  Smoking status  . Never Smoker   Smokeless tobacco  . Never Used    Past Medical History Patient Active Problem List   Diagnosis Date Noted  . UTI (lower urinary tract infection) 11/12/2015  . DM hyperosmolarity type II, uncontrolled ([place]) 11/12/2015  . Dementia   . Encephalopathy, metabolic [id number]  . S/P right TKA 08/13/2015  . S/P knee replacement 08/13/2015  . Elevated troponin 03/06/2015  . Fever   . History of diabetes mellitus   . Left buttock pain   . Hyperlipidemia  01/11/2015  . GAD (generalized anxiety disorder) 01/11/2015  . Depression 01/11/2015  . GERD (gastroesophageal reflux disease) 01/11/2015  . Osteopenia 07/13/2014  . Obesity (BMI 30-39.9) 06/19/2014  . Carotid stenosis   . Anemia of chronic disease 12/25/2011  . Iron deficiency anemia 05/27/2011  . Diastolic CHF, chronic ([place]) 04/02/2010  . Type 2 diabetes mellitus with insulin therapy ([place]) 10/08/2009  . DYSLIPIDEMIA 10/08/2009  . Essential hypertension 10/08/2009  . Coronary atherosclerosis 10/08/2009  . CAROTID STENOSIS 10/08/2009        Objective:    BP 150/68 mmHg  Pulse 58  Temp(Src) 97 F (36.1 C) (Oral)  Ht 5\' 6"  (1.676 m)  Wt 169 lb (76.658 kg)  BMI 27.29 kg/m2  Wt Readings from Last 3 Encounters:  01/06/16 169 lb (76.658 kg)  12/18/15 168 lb (76.204 kg)  11/28/15 172 lb 12.8 oz (78.382 kg)     Gen: NAD, alert, cooperative with exam, NCAT EYES: EOMI, no scleral injection or icterus ENT:  TMs pearly gray b/l, OP without erythema LYMPH: no cervical LAD CV: NRRR, normal S1/S2, no murmur, distal pulses 2+ b/l Resp: CTABL, no wheezes, normal WOB Abd: +BS, soft, NTND. no guarding or organomegaly Ext: No edema, warm Neuro: Alert and oriented, strength equal b/l UE and LE, coordination grossly normal  MSK: normal muscle bulk Skin: sebaceous cyst middle back, just to R of spine with scab, minimal erythema surrounding area. No current discharge, no fluctuance, no tenderness     Assessment & Plan:    [name] was seen today for recurrent infection of sebaceous cyst. Has happened apprx once a year. Offered to refer for removal of cyst but pt does not want to go through removal now. Area draining now. Rec warm compresses, topical mupirocin. Does not have surrounding cellulitis. Will do topical abx, discussed care as below, if recurs again should consider removal of cyst.   Diagnoses and all orders for this visit:  Infected sebaceous cyst of skin -     mupirocin ointment  (BACTROBAN) 2 %; Place 1 application into the nose 2 (two) times daily.  Constipation, unspecified constipation type See below for constipation directions.   Essential HTN Slightly elevated from baseline today. Continue current meds. Recheck next visit.  Patient Instructions Constipation: Docusate 100mg  twice a day Metamucil or psyillium fiber twice a day, at breakfast and after lunch Miralax 1/2 to 1 capful as needed for constipation. Take again after lunch as needed if no stool Goal of 1-2 soft stools every day  Infected sebaceous cyst: Antibiotic ointment twice a day on cyst until improved Hold warm compresses to back for 5-10 minutes 3 times a day   Follow up plan: Return if symptoms worsen or fail to improve.  Assunta Found, MD [facility] 01/06/2016, 11:43 AM

## 2016-01-06 NOTE — Therapy (Signed)
[facility] [place], [place], [address] Phone: [phone]   Fax:  [phone]  Physical Therapy Treatment  Patient Details  Name: [name] MRN: [id number] Date of Birth: [birth year] Referring Provider: Paralee Cancel MD.  Encounter Date: 01/06/2016      PT End of Session - 01/06/16 0947    Visit Number 23   Number of Visits 24   Date for PT Re-Evaluation 01/12/16   PT Start Time 0945   PT Stop Time 1030   PT Time Calculation (min) 45 min   Activity Tolerance Patient tolerated treatment well   Behavior During Therapy [facility] for tasks assessed/performed      Past Medical History  Diagnosis Date  . Anemia   . Hypertension   . Iron deficiency anemia 05/27/2011  . CHF (congestive heart failure) ([place])     EF preserved Echo 2012  . 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 Carotid stenosis   . Renal insufficiency   . Furuncle of back, except buttock   . Vertigo     "when sugar gets low"  . CAD (coronary artery disease)   . Chronic kidney infection   . Arthritis     back, knees, and hips  . Myocardial infarction ([place])   . Balance problems   . Speech problem     from stroke  . Vision loss     left eye-"half blind"  . Hearing loss   . Shortness of breath dyspnea     with activity  . H/O seasonal allergies   . Pneumonia     hx of  . Asthma     with allergies  . Swallowing difficulty   . Diabetes mellitus     type 2  . Anxiety   . Depression   . GERD (gastroesophageal reflux disease)   . Dementia     "a little"    Past Surgical History  Procedure Laterality Date  . I & d of furuncle  April 2013  . Coronary artery bypass graft  2006  . Coronary angioplasty      prior to 2006 5 stents  . Total abdominal hysterectomy  ~82 years old    complete, with tumor removal  . Cholecystectomy    . Appendectomy      with  hysterectomy  . Cataract extraction Bilateral 5 years ago  . Total knee arthroplasty Right 08/13/2015    Procedure: RIGHT  TOTAL KNEE ARTHROPLASTY;  Surgeon: Paralee Cancel, MD;  Location: [facility];  Service: Orthopedics;  Laterality: Right;  . Knee surgery Right     There were no vitals filed for this visit.  Visit Diagnosis:  Knee stiffness, right      Subjective Assessment - 01/06/16 0947    Subjective Patient states she has no pain in her knees today but her right hip hurts.   Patient Stated Goals Get out of pain.   Pain Score 4    Pain Location Hip   Pain Orientation Right   Pain Descriptors / Indicators Sore   Pain Type Chronic pain                         [facility] Adult PT Treatment/Exercise - 01/06/16 0001    Knee/Hip Exercises: Aerobic   Nustep L6 x48mn   Knee/Hip Exercises: Standing   Lateral Step Up Right;2 sets;10  reps;Hand Hold: 2;Step Height: 4";Step Height: 6"   Forward Step Up 3 sets;10 reps;Hand Hold: 2;Step Height: 4";Step Height: 6";Step Height: 8";Right  10 each   Other Standing Knee Exercises R standing HS curl 4# 3x10 reps   Knee/Hip Exercises: Seated   Long Arc Quad Strengthening;Right;3 sets;10 reps;Weights   Long Arc Quad Weight 5 lbs.   Knee/Hip Exercises: Supine   Bridges with [name] Squeeze Strengthening;Both;2 sets;10 reps  4# ball   Straight Leg Raises Strengthening;Right;10 reps;2 sets   Knee/Hip Exercises: Sidelying   Hip ABduction Strengthening;Right;10 reps;2 sets                  PT Short Term Goals - 10/14/15 1121    PT SHORT TERM GOAL #1   Title Ind with an initial HEP.   Time 3   Period Weeks   Status Achieved   PT SHORT TERM GOAL #2   Title Full active right knee extension.   Time 3   Period Weeks   Status Achieved  R knee ext 0 deg 10/14/2015           PT Long Term Goals - 12/03/15 1114    PT LONG TERM GOAL #1   Title Ind with an advanced HEP.   Time 6   Period Weeks   Status Achieved   PT LONG  TERM GOAL #2   Title Right hip and knee strength= 5/5.   Time 6   Period Weeks   Status Partially Met   PT LONG TERM GOAL #3   Title Perform ADL's with pain not > 3/10.   Time 6   Period Weeks   Status Achieved               Plan - 01/06/16 1038    Clinical Impression Statement Patietn did well today with therex. She advanced to higher step for forward step ups which is comparable to her steps into trailer.   PT Next Visit Plan D/C visit; complete FOTO; assess strength. Finalize HEP.        Problem List Patient Active Problem List   Diagnosis Date Noted  . UTI (lower urinary tract infection) 11/12/2015  . DM hyperosmolarity type II, uncontrolled ([place]) 11/12/2015  . Dementia   . Encephalopathy, metabolic 54/08/8118  . S/P right TKA 08/13/2015  . S/P knee replacement 08/13/2015  . Elevated troponin 03/06/2015  . Fever   . History of diabetes mellitus   . Left buttock pain   . Hyperlipidemia 01/11/2015  . GAD (generalized anxiety disorder) 01/11/2015  . Depression 01/11/2015  . GERD (gastroesophageal reflux disease) 01/11/2015  . Osteopenia 07/13/2014  . Obesity (BMI 30-39.9) 06/19/2014  . Carotid stenosis   . Anemia of chronic disease 12/25/2011  . Iron deficiency anemia 05/27/2011  . Diastolic CHF, chronic ([place]) 04/02/2010  . Type 2 diabetes mellitus with insulin therapy ([place]) 10/08/2009  . DYSLIPIDEMIA 10/08/2009  . Essential hypertension 10/08/2009  . Coronary atherosclerosis 10/08/2009  . CAROTID STENOSIS 10/08/2009   Madelyn Flavors PT  01/06/2016, 10:41 AM  [facility] [address] [place], [place], [address] Phone: [phone]   Fax:  [phone]  Name: [name] MRN: [id number] Date of Birth: Feb 19, 1931

## 2016-01-07 ENCOUNTER — Encounter: Payer: Self-pay | Admitting: Physical Therapy

## 2016-01-07 ENCOUNTER — Ambulatory Visit: Payer: Medicare Other | Admitting: Physical Therapy

## 2016-01-07 DIAGNOSIS — M25561 Pain in right knee: Secondary | ICD-10-CM | POA: Diagnosis not present

## 2016-01-07 DIAGNOSIS — M25661 Stiffness of right knee, not elsewhere classified: Secondary | ICD-10-CM | POA: Diagnosis not present

## 2016-01-07 NOTE — Therapy (Signed)
[facility] [place], [place], [address] Phone: [phone]   Fax:  [phone]  Physical Therapy Treatment  Patient Details  Name: [name] MRN: [id number] Date of Birth: Jan 23, 1931 Referring Provider: Paralee Cancel MD.  Encounter Date: 01/07/2016      PT End of Session - 01/07/16 1120    Visit Number 24   Number of Visits 24   Date for PT Re-Evaluation 01/12/16   PT Start Time 1119   PT Stop Time 1202   PT Time Calculation (min) 43 min   Activity Tolerance Patient tolerated treatment well;Patient limited by fatigue   Behavior During Therapy [facility] for tasks assessed/performed      Past Medical History  Diagnosis Date  . Anemia   . Hypertension   . Iron deficiency anemia 05/27/2011  . CHF (congestive heart failure) ([place])     EF preserved Echo 2012  . 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 Carotid stenosis   . Renal insufficiency   . Furuncle of back, except buttock   . Vertigo     "when sugar gets low"  . CAD (coronary artery disease)   . Chronic kidney infection   . Arthritis     back, knees, and hips  . Myocardial infarction ([place])   . Balance problems   . Speech problem     from stroke  . Vision loss     left eye-"half blind"  . Hearing loss   . Shortness of breath dyspnea     with activity  . H/O seasonal allergies   . Pneumonia     hx of  . Asthma     with allergies  . Swallowing difficulty   . Diabetes mellitus     type 2  . Anxiety   . Depression   . GERD (gastroesophageal reflux disease)   . Dementia     "a little"    Past Surgical History  Procedure Laterality Date  . I & d of furuncle  April 2013  . Coronary artery bypass graft  2006  . Coronary angioplasty      prior to 2006 5 stents  . Total abdominal hysterectomy  ~82 years old    complete, with tumor removal  . Cholecystectomy    .  Appendectomy      with hysterectomy  . Cataract extraction Bilateral 5 years ago  . Total knee arthroplasty Right 08/13/2015    Procedure: RIGHT  TOTAL KNEE ARTHROPLASTY;  Surgeon: Paralee Cancel, MD;  Location: [facility];  Service: Orthopedics;  Laterality: Right;  . Knee surgery Right     There were no vitals filed for this visit.  Visit Diagnosis:  Knee stiffness, right  Right knee pain      Subjective Assessment - 01/07/16 1120    Subjective Reports that R knee is a little sore today but no pain rating given and patient attributes that to cold weather.   Limitations Walking   Patient Stated Goals Get out of pain.   Currently in Pain? Other (Comment)  Reported soreness in knee but gave no numerical pain rating only saying it was a small amount            [facility] PT Assessment - 01/07/16 0001    Assessment   Medical Diagnosis Right total knee replacement.   Onset Date/Surgical Date 08/13/15   ROM / Strength  AROM / PROM / Strength Strength   Strength   Overall Strength Within functional limits for tasks performed;Deficits   Strength Assessment Site Knee;Hip   Right/Left Hip Right   Right Hip Flexion 4+/5   Right Hip ABduction 4+/5   Right/Left Knee Right   Right Knee Flexion 4+/5   Right Knee Extension 5/5                     [facility] Adult PT Treatment/Exercise - 01/07/16 0001    Knee/Hip Exercises: Aerobic   Nustep L6 x55mn   Knee/Hip Exercises: Standing   Lateral Step Up Right;2 sets;10 reps;Hand Hold: 2;Step Height: 6"   Forward Step Up Right;2 sets;10 reps;Hand Hold: 2;Step Height: 8"   Other Standing Knee Exercises R standing HS curl 4# 3x10 reps   Knee/Hip Exercises: Seated   Long Arc Quad Strengthening;Right;3 sets;10 reps;Weights   Long Arc Quad Weight 5 lbs.   Knee/Hip Exercises: Supine   Bridges with BDiona FoleySqueeze Strengthening;Both;2 sets;10 reps  4# ball   Straight Leg Raises Strengthening;Right;10 reps;3 sets   Knee/Hip Exercises: Sidelying    Hip ABduction Strengthening;Right;3 sets;10 reps                  PT Short Term Goals - 10/14/15 1121    PT SHORT TERM GOAL #1   Title Ind with an initial HEP.   Time 3   Period Weeks   Status Achieved   PT SHORT TERM GOAL #2   Title Full active right knee extension.   Time 3   Period Weeks   Status Achieved  R knee ext 0 deg 10/14/2015           PT Long Term Goals - 01/07/16 1212    PT LONG TERM GOAL #1   Title Ind with an advanced HEP.   Time 6   Period Weeks   Status Achieved   PT LONG TERM GOAL #2   Title Right hip and knee strength= 5/5.   Time 6   Period Weeks   Status Partially Met  R hip flex/abduct 4+/5, R knee flex 4+/5, R knee ext 5/5 01/07/2016   PT LONG TERM GOAL #3   Title Perform ADL's with pain not > 3/10.   Time 6   Period Weeks   Status Achieved               Plan - 01/07/16 1206    Clinical Impression Statement Patient progressed fairly well during PT with fatigue limiting her but may have been from her other comorbidities. Patient continued to do well with 8" forward step today but by the end of all exercises today. Patient was able to tolerate more repitiitions of exercises today but required rest breaks due to fatigue. Achieved all goals set at evaluation except for R knee and hip strength to be 5/5. R hip flex, abduct and R knee flexors MMT 4+/5, R knee extensors 5/5. Patient continues to ambulate with rollator at this time and at times in spinal flexion and walking with rollator too far in front of her. Patient has been educated previously to keep rollator close to her during ambulation and to stand tall but goes back to former ambulation posture after a period of time.   Pt will benefit from skilled therapeutic intervention in order to improve on the following deficits Pain;Decreased activity tolerance;Increased edema;Decreased strength;Decreased range of motion   Rehab Potential Good   PT Frequency 3x / week  PT Duration 4  weeks   PT Treatment/Interventions ADLs/Self Care Home Management;Electrical Stimulation;Cryotherapy;Therapeutic exercise;Therapeutic activities;Patient/family education;Manual techniques;Vasopneumatic Device   PT Next Visit Plan Communicate to MPT of D/C summary requirement   PT Home Exercise Plan bridging, SLR   Consulted and Agree with Plan of Care Patient        Problem List Patient Active Problem List   Diagnosis Date Noted  . UTI (lower urinary tract infection) 11/12/2015  . DM hyperosmolarity type II, uncontrolled ([place]) 11/12/2015  . Dementia   . Encephalopathy, metabolic 59/53/9672  . S/P right TKA 08/13/2015  . S/P knee replacement 08/13/2015  . Elevated troponin 03/06/2015  . Fever   . History of diabetes mellitus   . Left buttock pain   . Hyperlipidemia 01/11/2015  . GAD (generalized anxiety disorder) 01/11/2015  . Depression 01/11/2015  . GERD (gastroesophageal reflux disease) 01/11/2015  . Osteopenia 07/13/2014  . Obesity (BMI 30-39.9) 06/19/2014  . Carotid stenosis   . Anemia of chronic disease 12/25/2011  . Iron deficiency anemia 05/27/2011  . Diastolic CHF, chronic ([place]) 04/02/2010  . Type 2 diabetes mellitus with insulin therapy ([place]) 10/08/2009  . DYSLIPIDEMIA 10/08/2009  . Essential hypertension 10/08/2009  . Coronary atherosclerosis 10/08/2009  . CAROTID STENOSIS 10/08/2009    Ahmed Prima, PTA 01/07/2016 12:17 PM  [facility] [place], [place], [address] Phone: [phone]   Fax:  [phone]  Name: [name] MRN: [id number] Date of Birth: 10-11-1931

## 2016-01-08 DIAGNOSIS — M25561 Pain in right knee: Secondary | ICD-10-CM | POA: Diagnosis not present

## 2016-01-08 DIAGNOSIS — M25661 Stiffness of right knee, not elsewhere classified: Secondary | ICD-10-CM | POA: Diagnosis not present

## 2016-01-08 NOTE — Therapy (Signed)
[facility] [place], [place], [address] Phone: [phone]   Fax:  [phone]  Physical Therapy Treatment  Patient Details  Name: [name] MRN: [id number] Date of Birth: 12-11-1930 Referring Provider: Paralee Cancel MD.  Encounter Date: 01/07/2016      PT End of Session - 01/07/16 1120    Visit Number 24   Number of Visits 24   Date for PT Re-Evaluation 01/12/16   PT Start Time 1119   PT Stop Time 1202   PT Time Calculation (min) 43 min   Activity Tolerance Patient tolerated treatment well;Patient limited by fatigue   Behavior During Therapy [facility] for tasks assessed/performed      Past Medical History  Diagnosis Date  . Anemia   . Hypertension   . Iron deficiency anemia 05/27/2011  . CHF (congestive heart failure) ([place])     EF preserved Echo 2012  . 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 Carotid stenosis   . Renal insufficiency   . Furuncle of back, except buttock   . Vertigo     "when sugar gets low"  . CAD (coronary artery disease)   . Chronic kidney infection   . Arthritis     back, knees, and hips  . Myocardial infarction ([place])   . Balance problems   . Speech problem     from stroke  . Vision loss     left eye-"half blind"  . Hearing loss   . Shortness of breath dyspnea     with activity  . H/O seasonal allergies   . Pneumonia     hx of  . Asthma     with allergies  . Swallowing difficulty   . Diabetes mellitus     type 2  . Anxiety   . Depression   . GERD (gastroesophageal reflux disease)   . Dementia     "a little"    Past Surgical History  Procedure Laterality Date  . I & d of furuncle  April 2013  . Coronary artery bypass graft  2006  . Coronary angioplasty      prior to 2006 5 stents  . Total abdominal hysterectomy  ~82 years old    complete, with tumor removal  . Cholecystectomy    .  Appendectomy      with hysterectomy  . Cataract extraction Bilateral 5 years ago  . Total knee arthroplasty Right 08/13/2015    Procedure: RIGHT  TOTAL KNEE ARTHROPLASTY;  Surgeon: Paralee Cancel, MD;  Location: [facility];  Service: Orthopedics;  Laterality: Right;  . Knee surgery Right     There were no vitals filed for this visit.  Visit Diagnosis:  Knee stiffness, right  Right knee pain      Subjective Assessment - 01/07/16 1120    Subjective Reports that R knee is a little sore today but no pain rating given and patient attributes that to cold weather.   Limitations Walking   Patient Stated Goals Get out of pain.   Currently in Pain? Other (Comment)  Reported soreness in knee but gave no numerical pain rating only saying it was a small amount                                   PT Short Term Goals - 10/14/15 1121  PT SHORT TERM GOAL #1   Title Ind with an initial HEP.   Time 3   Period Weeks   Status Achieved   PT SHORT TERM GOAL #2   Title Full active right knee extension.   Time 3   Period Weeks   Status Achieved  R knee ext 0 deg 10/14/2015           PT Long Term Goals - 01/07/16 1212    PT LONG TERM GOAL #1   Title Ind with an advanced HEP.   Time 6   Period Weeks   Status Achieved   PT LONG TERM GOAL #2   Title Right hip and knee strength= 5/5.   Time 6   Period Weeks   Status Partially Met  R hip flex/abduct 4+/5, R knee flex 4+/5, R knee ext 5/5 01/07/2016   PT LONG TERM GOAL #3   Title Perform ADL's with pain not > 3/10.   Time 6   Period Weeks   Status Achieved               Plan - 01/07/16 1206    Clinical Impression Statement Patient progressed fairly well during PT with fatigue limiting her but may have been from her other comorbidities. Patient continued to do well with 8" forward step today but by the end of all exercises today. Patient was able to tolerate more repitiitions of exercises today but required rest  breaks due to fatigue. Achieved all goals set at evaluation except for R knee and hip strength to be 5/5. R hip flex, abduct and R knee flexors MMT 4+/5, R knee extensors 5/5. Patient continues to ambulate with rollator at this time and at times in spinal flexion and walking with rollator too far in front of her. Patient has been educated previously to keep rollator close to her during ambulation and to stand tall but goes back to former ambulation posture after a period of time.   Pt will benefit from skilled therapeutic intervention in order to improve on the following deficits Pain;Decreased activity tolerance;Increased edema;Decreased strength;Decreased range of motion   Rehab Potential Good   PT Frequency 3x / week   PT Duration 4 weeks   PT Treatment/Interventions ADLs/Self Care Home Management;Electrical Stimulation;Cryotherapy;Therapeutic exercise;Therapeutic activities;Patient/family education;Manual techniques;Vasopneumatic Device   PT Next Visit Plan Communicate to MPT of D/C summary requirement   PT Home Exercise Plan bridging, SLR   Consulted and Agree with Plan of Care Patient          G-Codes - 01/27/2016 1716    Functional Assessment Tool Used FOTO.Marland Kitchen..44%.   Functional Limitation Mobility: Walking and moving around   Mobility: Walking and Moving Around Current Status [phone]) At least 40 percent but less than 60 percent impaired, limited or restricted   Mobility: Walking and Moving Around Goal Status [phone]) At least 20 percent but less than 40 percent impaired, limited or restricted   Mobility: Walking and Moving Around Discharge Status [phone]) At least 40 percent but less than 60 percent impaired, limited or restricted      Problem List Patient Active Problem List   Diagnosis Date Noted  . UTI (lower urinary tract infection) 11/12/2015  . DM hyperosmolarity type II, uncontrolled ([place]) 11/12/2015  . Dementia   . Encephalopathy, metabolic 00/17/4944  . S/P right TKA  08/13/2015  . S/P knee replacement 08/13/2015  . Elevated troponin 03/06/2015  . Fever   . History of diabetes mellitus   . Left buttock  pain   . Hyperlipidemia 01/11/2015  . GAD (generalized anxiety disorder) 01/11/2015  . Depression 01/11/2015  . GERD (gastroesophageal reflux disease) 01/11/2015  . Osteopenia 07/13/2014  . Obesity (BMI 30-39.9) 06/19/2014  . Carotid stenosis   . Anemia of chronic disease 12/25/2011  . Iron deficiency anemia 05/27/2011  . Diastolic CHF, chronic ([place]) 04/02/2010  . Type 2 diabetes mellitus with insulin therapy ([place]) 10/08/2009  . DYSLIPIDEMIA 10/08/2009  . Essential hypertension 10/08/2009  . Coronary atherosclerosis 10/08/2009  . CAROTID STENOSIS 10/08/2009   PHYSICAL THERAPY DISCHARGE SUMMARY  Visits from Start of Care: 24  Current functional level related to goals / functional outcomes: Please see above.   Remaining deficits: Goal #2 nearly met.   Education / Equipment: HEP. Plan: Patient agrees to discharge.  Patient goals were partially met. Patient is being discharged due to meeting the stated rehab goals.  ?????      Randilyn Foisy, [place] MPT 01/08/2016, 5:16 PM  [facility] [address] [place], [place], [address] Phone: [phone]   Fax:  [phone]  Name: [name] MRN: [id number] Date of Birth: 1931/08/15

## 2016-01-09 ENCOUNTER — Other Ambulatory Visit: Payer: Self-pay | Admitting: Family Medicine

## 2016-01-09 ENCOUNTER — Other Ambulatory Visit: Payer: Self-pay | Admitting: Pediatrics

## 2016-01-13 ENCOUNTER — Other Ambulatory Visit: Payer: Self-pay

## 2016-01-13 NOTE — Telephone Encounter (Signed)
Last seen 01/06/16 Dr Evette Doffing   If approved route to nurse to call into [facility]  [phone]

## 2016-01-14 MED ORDER — LORAZEPAM 0.5 MG PO TABS: ORAL_TABLET | ORAL | Status: DC

## 2016-01-14 NOTE — Telephone Encounter (Signed)
rx called into pharmacy

## 2016-01-14 NOTE — Telephone Encounter (Signed)
Forwarding to Dr. Sabra Heck, he has been her previous regular prescriber for this medication

## 2016-02-03 ENCOUNTER — Ambulatory Visit (INDEPENDENT_AMBULATORY_CARE_PROVIDER_SITE_OTHER): Payer: Medicare Other | Admitting: Family Medicine

## 2016-02-03 ENCOUNTER — Encounter: Payer: Self-pay | Admitting: Family Medicine

## 2016-02-03 VITALS — BP 139/57 | HR 60 | Temp 97.0°F | Ht 66.0 in | Wt 171.0 lb

## 2016-02-03 DIAGNOSIS — N3090 Cystitis, unspecified without hematuria: Secondary | ICD-10-CM

## 2016-02-03 DIAGNOSIS — R1030 Lower abdominal pain, unspecified: Secondary | ICD-10-CM

## 2016-02-03 LAB — POCT UA - MICROSCOPIC ONLY
CASTS, UR, LPF, POC: NEGATIVE
CRYSTALS, UR, HPF, POC: NEGATIVE
MUCUS UA: NEGATIVE
RBC, urine, microscopic: NEGATIVE
YEAST UA: NEGATIVE

## 2016-02-03 LAB — POCT URINALYSIS DIPSTICK
BILIRUBIN UA: NEGATIVE
GLUCOSE UA: NEGATIVE
KETONES UA: NEGATIVE
Nitrite, UA: NEGATIVE
SPEC GRAV UA: 1.025
UROBILINOGEN UA: NEGATIVE
pH, UA: 5

## 2016-02-03 MED ORDER — NITROFURANTOIN MONOHYD MACRO 100 MG PO CAPS: 100.0000 mg | ORAL_CAPSULE | Freq: Two times a day (BID) | ORAL | Status: DC

## 2016-02-03 NOTE — Addendum Note (Signed)
Addended by: Selmer Dominion on: 02/03/2016 05:41 PM   Modules accepted: Orders

## 2016-02-03 NOTE — Progress Notes (Addendum)
Subjective:  [name], female    DOB: [birth year]  Age: 82 y.o. MRN: [id number]  CC: Abdominal Pain   HPI [name] presents for burning with urination and frequency for several days. Denies fever . Moderate bilateral flank pain with urination. No nausea, vomiting. Daughter states pt. Has been dxed with chronic UTI   History [name] has a past medical history of Anemia; Hypertension; Iron deficiency anemia (05/27/2011); CHF (congestive heart failure) ([place]); CVA (cerebral infarction); Carotid stenosis; Renal insufficiency; Furuncle of back, except buttock; Vertigo; CAD (coronary artery disease); Chronic kidney infection; Arthritis; Myocardial infarction ([place]); Balance problems; Speech problem; Vision loss; Hearing loss; Shortness of breath dyspnea; H/O seasonal allergies; Pneumonia; Asthma; Swallowing difficulty; Diabetes mellitus; Anxiety; Depression; GERD (gastroesophageal reflux disease); and Dementia.   She has past surgical history that includes I & D of Furuncle (April 2013); Coronary artery bypass graft (2006); Coronary angioplasty; Total abdominal hysterectomy (~82 years old); Cholecystectomy; Appendectomy; Cataract extraction (Bilateral, 5 years ago); Total knee arthroplasty (Right, 08/13/2015); and Knee surgery (Right).   Her family history includes Cancer in her brother; Diabetes in her sister and sister; Early death in her sister; Heart attack in her brother, brother, and sister; Heart disease in her brother, brother, and brother; Hypertension in her sister; Osteoporosis in her sister.She reports that she has never smoked. She has never used smokeless tobacco. She reports that she does not drink alcohol or use illicit drugs.    ROS Review of Systems  Constitutional: Negative for fever, chills and diaphoresis.  HENT: Negative for congestion.   Eyes: Negative for visual disturbance.  Respiratory: Negative for cough and shortness of breath.   Cardiovascular:  Negative for chest pain and palpitations.  Gastrointestinal: Negative for nausea, diarrhea and constipation.  Genitourinary: Positive for dysuria, urgency and frequency. Negative for hematuria, flank pain, decreased urine volume, menstrual problem and pelvic pain.  Musculoskeletal: Negative for joint swelling and arthralgias.  Skin: Negative for rash.  Neurological: Negative for dizziness and numbness.    Objective:  BP 139/57 mmHg  Pulse 60  Temp(Src) 97 F (36.1 C) (Oral)  Ht 5\' 6"  (1.676 m)  Wt 171 lb (77.565 kg)  BMI 27.61 kg/m2  SpO2 97%  BP Readings from Last 3 Encounters:  02/03/16 139/57  01/06/16 150/68  12/18/15 132/64    Wt Readings from Last 3 Encounters:  02/03/16 171 lb (77.565 kg)  01/06/16 169 lb (76.658 kg)  12/18/15 168 lb (76.204 kg)     Physical Exam  Constitutional: She is oriented to person, place, and time. She appears well-developed and well-nourished.  HENT:  Head: Normocephalic and atraumatic.  Cardiovascular: Normal rate and regular rhythm.   No murmur heard. Pulmonary/Chest: Effort normal and breath sounds normal.  Abdominal: Soft. Bowel sounds are normal. She exhibits no mass. There is no tenderness. There is no rebound and no guarding.  Musculoskeletal: She exhibits no tenderness.  Neurological: She is alert and oriented to person, place, and time.  Skin: Skin is warm and dry.  Psychiatric: She has a normal mood and affect. Her behavior is normal.     Lab Results  Component Value Date   WBC 6.2 11/13/2015   HGB 11.1* 11/13/2015   HCT 32.9* 11/13/2015   PLT 202 11/13/2015   GLUCOSE 207* 11/14/2015   CHOL 131 03/06/2015   TRIG 141 03/06/2015   HDL 48 03/06/2015   LDLCALC 55 03/06/2015   ALT 14 11/12/2015   AST 10* 11/12/2015   NA  138 11/14/2015   K 4.3 11/14/2015   CL 104 11/14/2015   CREATININE 0.93 11/14/2015   BUN 18 11/14/2015   CO2 26 11/14/2015   TSH 1.707 09/17/2015   INR 1.09 08/06/2015   HGBA1C 11.2* 11/13/2015     No results found.  Assessment & Plan:   [name] was seen today for abdominal pain.  Diagnoses and all orders for this visit:  Cystitis -     Urine culture  Lower abdominal pain -     POCT urinalysis dipstick -     POCT UA - Microscopic Only -     Urine culture  Other orders -     nitrofurantoin, macrocrystal-monohydrate, (MACROBID) 100 MG capsule; Take 1 capsule (100 mg total) by mouth 2 (two) times daily.      I am having [name] start on nitrofurantoin (macrocrystal-monohydrate). I am also having her maintain her Artificial Tear Ointment (DRY EYES OP), Multiple Vitamins-Iron (DAILY VITAMINS/IRON/BETA CAROT PO), furosemide, EASY TOUCH INSULIN SYRINGE, nitroGLYCERIN, meclizine, docusate sodium, ferrous sulfate, HYDROcodone-acetaminophen, polyethylene glycol, traMADol, esomeprazole, clopidogrel, Insulin Detemir, Insulin Pen Needle, HUMULIN N, Linaclotide, fluocinolone, Fluocinonide, metoprolol, lisinopril, mupirocin ointment, isosorbide mononitrate, amLODipine, metFORMIN, and LORazepam.  Meds ordered this encounter  Medications  . nitrofurantoin, macrocrystal-monohydrate, (MACROBID) 100 MG capsule    Sig: Take 1 capsule (100 mg total) by mouth 2 (two) times daily.    Dispense:  14 capsule    Refill:  0     Follow-up: Return if symptoms worsen or fail to improve.  [name], M.D.

## 2016-02-04 ENCOUNTER — Other Ambulatory Visit: Payer: Self-pay | Admitting: Family Medicine

## 2016-02-04 ENCOUNTER — Other Ambulatory Visit: Payer: Self-pay | Admitting: Pediatrics

## 2016-02-04 NOTE — Telephone Encounter (Signed)
Last seen 02/03/16 Dr Livia Snellen  Last lipid 03/06/15   [name] is PCP

## 2016-02-05 ENCOUNTER — Other Ambulatory Visit: Payer: Self-pay

## 2016-02-05 ENCOUNTER — Other Ambulatory Visit: Payer: Self-pay | Admitting: Family

## 2016-02-05 LAB — URINE CULTURE

## 2016-02-05 MED ORDER — INSULIN LISPRO 100 UNIT/ML (KWIKPEN): 3.0000 [IU] | PEN_INJECTOR | Freq: Three times a day (TID) | SUBCUTANEOUS | Status: DC | PRN

## 2016-02-05 NOTE — Telephone Encounter (Signed)
Last seen 02/03/16  Dr Currie Paris is PCP  If approved route to nurse to call into [facility]   [phone]

## 2016-02-06 MED ORDER — LORAZEPAM 0.5 MG PO TABS: ORAL_TABLET | ORAL | Status: DC

## 2016-02-06 NOTE — Telephone Encounter (Signed)
Refill called to pharmacy.

## 2016-02-20 ENCOUNTER — Ambulatory Visit (INDEPENDENT_AMBULATORY_CARE_PROVIDER_SITE_OTHER): Payer: Medicare Other | Admitting: Family Medicine

## 2016-02-20 ENCOUNTER — Encounter: Payer: Self-pay | Admitting: Family Medicine

## 2016-02-20 VITALS — BP 158/67 | HR 55 | Temp 97.5°F | Ht 66.0 in | Wt 170.0 lb

## 2016-02-20 DIAGNOSIS — E1100 Type 2 diabetes mellitus with hyperosmolarity without nonketotic hyperglycemic-hyperosmolar coma (NKHHC): Secondary | ICD-10-CM | POA: Diagnosis not present

## 2016-02-20 DIAGNOSIS — E785 Hyperlipidemia, unspecified: Secondary | ICD-10-CM

## 2016-02-20 DIAGNOSIS — D509 Iron deficiency anemia, unspecified: Secondary | ICD-10-CM

## 2016-02-20 LAB — BAYER DCA HB A1C WAIVED: HB A1C: 8.6 % — AB (ref <7.0)

## 2016-02-20 NOTE — Progress Notes (Signed)
[name], female   DOB: 06-02-1931, 82 y.o.   MRN: [id number]  Primary Physician: Evelina Dun, FNP  Chief Complaint: 82 year old female here to follow-up hypertension, congestive heart failure, old CVA, diabetes. Even though she lives with her daughters she eats lots of carbs and salt. Her weight is stable. Last A1c was 11.2. She gets around with a walker outside the house. Has had no recent falls even though her gait is really unstable. She is status post total knee replacement within the last year.     Past Medical History  Diagnosis Date  . Anemia   . Hypertension   . Iron deficiency anemia 05/27/2011  . CHF (congestive heart failure) ([place])     EF preserved Echo 2012  . 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 Carotid stenosis   . Renal insufficiency   . Furuncle of back, except buttock   . Vertigo     "when sugar gets low"  . CAD (coronary artery disease)   . Chronic kidney infection   . Arthritis     back, knees, and hips  . Myocardial infarction ([place])   . Balance problems   . Speech problem     from stroke  . Vision loss     left eye-"half blind"  . Hearing loss   . Shortness of breath dyspnea     with activity  . H/O seasonal allergies   . Pneumonia     hx of  . Asthma     with allergies  . Swallowing difficulty   . Diabetes mellitus     type 2  . Anxiety   . Depression   . GERD (gastroesophageal reflux disease)   . Dementia     "a little"     Home Meds: Prior to Admission medications   Medication Sig Start Date End Date Taking? Authorizing Provider  amLODipine (NORVASC) 10 MG tablet TAKE 1 TABLET ONCE DAILY. 01/10/16   Eustaquio Maize, MD  Artificial Tear Ointment (DRY EYES OP) Apply 1 drop to eye daily as needed. For dry eye/ red eye     Historical Provider, MD  atorvastatin (LIPITOR) 20 MG tablet TAKE 1 TABLET BY MOUTH AT BEDTIME. 02/04/16   Sharion Balloon, FNP  clopidogrel (PLAVIX) 75 MG tablet TAKE (1) TABLET BY MOUTH ONCE DAILY. 10/04/15   Wardell Honour, MD  docusate sodium (COLACE) 100 MG capsule Take 1 capsule (100 mg total) by mouth 2 (two) times daily. 08/15/15   Danae Orleans, PA-C  EASY TOUCH INSULIN SYRINGE 31G X 5/16" 1 ML MISC USE AS DIRECTED. 02/07/15   Chipper Herb, MD  esomeprazole (NEXIUM) 40 MG capsule TAKE 1 CAPSULE BY MOUTH ONCE A DAY. Patient taking differently: TAKE 1 CAPSULE BY MOUTH ONCE A DAILY AS NEEDED FOR ACID REFLUX 09/05/15   Wardell Honour, MD  ferrous sulfate 325 (65 FE) MG tablet Take 1 tablet (325 mg total) by mouth 3 (three) times daily after meals. Patient taking differently: Take 325 mg by mouth 2 (two) times daily with a meal.  08/15/15   Danae Orleans, PA-C  fluocinolone (VANOS) 0.01 % cream Apply topically 2 (two) times daily. 11/22/15   [name], MD  Fluocinonide 0.1 % CREA  11/22/15   Historical Provider, MD  furosemide (LASIX) 20 MG tablet Take 1 tablet (20 mg total) by mouth daily as needed for  fluid or edema. 09/20/14   Lysbeth Penner, FNP  HUMALOG KWIKPEN 100 UNIT/ML KiwkPen USE PER SLIDING SCALE NOTIFY MD OF RESULTS UNDER 60 OR OVER 300. 02/05/16   Sharion Balloon, FNP  HUMULIN N 100 UNIT/ML injection Inject 28 Units into the skin every morning. 09/04/15   Historical Provider, MD  HYDROcodone-acetaminophen (NORCO) 7.5-325 MG per tablet Take 1-2 tablets by mouth every 4 (four) hours as needed for moderate pain. 08/15/15   Danae Orleans, PA-C  Insulin Detemir (LEVEMIR) 100 UNIT/ML Pen Inject 28 Units into the skin every morning. 11/14/15   Silver Huguenin Elgergawy, MD  Insulin Pen Needle 31G X 5 MM MISC Use with  Levemir injection 11/14/15   Silver Huguenin Elgergawy, MD  isosorbide mononitrate (IMDUR) 30 MG 24 hr tablet TAKE 1 TABLET ONCE DAILY. 01/10/16   Wardell Honour, MD  Linaclotide [facility]) 145 MCG CAPS capsule Take 1 capsule (145 mcg total) by mouth daily. To regulate bowel movements 11/22/15   [name], MD  lisinopril (PRINIVIL,ZESTRIL) 20 MG tablet TAKE (1) TABLET BY MOUTH ONCE DAILY. 12/04/15   Timmothy Euler, MD  LORazepam (ATIVAN) 0.5 MG tablet TAKE (1) TABLET TWICE DAILY as needed for anxiety 02/06/16   Sharion Balloon, FNP  meclizine (ANTIVERT) 25 MG tablet TAKE 1 TABLET TWICE DAILY AS NEEDED FOR DIZZINESS. Patient taking differently: TAKE 1 TABLET TWICE DAILY FOR DIZZINESS. 08/05/15   Chipper Herb, MD  metFORMIN (GLUCOPHAGE-XR) 500 MG 24 hr tablet TAKE 1 TABLET WITH BREAKFAST AND 1 TABLET WITH SUPPER. 01/10/16   Wardell Honour, MD  metoprolol (LOPRESSOR) 50 MG tablet TAKE (1) TABLET TWICE DAILY. 12/04/15   Timmothy Euler, MD  Multiple Vitamins-Iron (DAILY VITAMINS/IRON/BETA CAROT PO) Take 1 tablet by mouth at bedtime.    Historical Provider, MD  mupirocin ointment (BACTROBAN) 2 % Place 1 application into the nose 2 (two) times daily. 01/06/16   Eustaquio Maize, MD  nitroGLYCERIN (NITROSTAT) 0.4 MG SL tablet PLACE ONE (1) TABLET UNDER TONGUE EVERY 5 MINUTES UP TO (3) DOSES AS NEEDED FOR CHEST PAIN. 06/06/15   Wardell Honour, MD  PARoxetine (PAXIL) 20 MG tablet TAKE (1) TABLET BY MOUTH ONCE DAILY. 02/04/16   [name], MD  polyethylene glycol [facility]) packet Take 17 g by mouth 2 (two) times daily. Patient taking differently: Take 17 g by mouth daily as needed for mild constipation.  08/15/15   Danae Orleans, PA-C  traMADol (ULTRAM) 50 MG tablet Take one tablet by mouth every 6 hours as needed for pain. Monitor for increased confusion, sedation, allergic reaction Patient taking differently: Take 50 mg by mouth every 6 (six) hours as needed for moderate pain. Take one tablet by mouth every 6 hours as needed for pain. Monitor for increased confusion, sedation, allergic reaction 08/19/15   Gayland Curry, DO    Allergies:  Allergies  Allergen Reactions  . Advil [Ibuprofen] Swelling  . Heparin Other (See Comments)    Confusion  . Propoxyphene N-Acetaminophen Other  (See Comments)    Numbness all over. "floating" sensation.    Social History   Social History  . Marital Status: Widowed    Spouse Name: N/A  . Number of Children: N/A  . Years of Education: N/A   Occupational History  . Not on file.   Social History Main Topics  . Smoking status: Never Smoker   . Smokeless tobacco: Never Used  . Alcohol Use: No  . Drug Use: No  .  Sexual Activity: Not Currently   Other Topics Concern  . Not on file   Social History Narrative     Review of Systems: Constitutional: negative for chills, fever, night sweats, weight changes, or fatigue  HEENT: negative for vision changes, hearing loss, congestion, rhinorrhea, ST, epistaxis, or sinus pressure Cardiovascular: negative for chest pain or palpitations; does have some dependent edema by the end of the day Respiratory: negative for hemoptysis, wheezing, shortness of breath, or cough Abdominal: negative for abdominal pain, nausea, vomiting, diarrhea, frequent constipation as well as incontinence of stool Dermatological: negative for rash Neurologic: negative for headache, has dizziness or syncope All other systems reviewed and are otherwise negative with the exception to those above and in the HPI.   Physical Exam Blood pressure 158/67, pulse 55, temperature 97.5 F (36.4 C), temperature source Oral, height 5' 6"  (1.676 m), weight 170 lb (77.111 kg)., Body mass index is 27.45 kg/(m^2). General: Well developed, well nourished, in no acute distress. Head: Normocephalic, atraumatic, eyes without discharge, sclera non-icteric, nares are without discharge. Bilateral auditory canals clear, TM's are without perforation, pearly grey and translucent with reflective cone of light bilaterally. Oral cavity moist, posterior pharynx without exudate, erythema, peritonsillar abscess, or post nasal drip.  Neck: Supple. No thyromegaly. Full ROM. No lymphadenopathy. Lungs: Clear bilaterally to auscultation without  wheezes, rales, or rhonchi. Breathing is unlabored. Heart: RRR with S1 S2. No murmurs, rubs, or gallops appreciated. Abdomen: Soft, non-tender, non-distended with normoactive bowel sounds. No hepatomegaly. No rebound/guarding. No obvious abdominal masses. Msk:  Strength and tone normal for age. Extremities/Skin: Warm and dry. No clubbing or cyanosis. No edema. No rashes or suspicious lesions. Neuro: Alert and oriented X 2. Moves all extremities spontaneously. Gait is abnormal. CNII-XII grossly in tact. Psych:  Responds to questions appropriately with a normal affect.   Labs:   ASSESSMENT AND PLAN:  1. Uncontrolled type 2 diabetes mellitus with hyperosmolarity without coma, without long-term current use of insulin (HCC) Sugars are not well controlled largely because her diet is not optimal. - Bayer DCA Hb A1c Waived  2. Iron deficiency anemia Check hemoglobin today  3. Hyperlipidemia Lipids were last assessed one year ago LDL was at goal. - CMP14+EGFR - Lipid panel  Wardell Honour MD  02/20/2016 8:22 AM

## 2016-02-20 NOTE — Patient Instructions (Signed)
Thank you for allowing us to care for you today. We strive to provide exceptional quality and compassionate care. Please let us know how we are doing and how we can help serve you better by filling out the survey that you receive from [employer].     

## 2016-02-21 LAB — CMP14+EGFR
ALT: 13 IU/L (ref 0–32)
AST: 14 IU/L (ref 0–40)
Albumin/Globulin Ratio: 1.6 (ref 1.2–2.2)
Albumin: 4.1 g/dL (ref 3.5–4.7)
Alkaline Phosphatase: 48 IU/L (ref 39–117)
BUN / CREAT RATIO: 24 (ref 11–26)
BUN: 26 mg/dL (ref 8–27)
Bilirubin Total: 0.3 mg/dL (ref 0.0–1.2)
CALCIUM: 9.7 mg/dL (ref 8.7–10.3)
CO2: 26 mmol/L (ref 18–29)
Chloride: 101 mmol/L (ref 96–106)
Creatinine, Ser: 1.09 mg/dL — ABNORMAL HIGH (ref 0.57–1.00)
GFR, EST AFRICAN AMERICAN: 55 mL/min/{1.73_m2} — AB (ref >59)
GFR, EST NON AFRICAN AMERICAN: 47 mL/min/{1.73_m2} — AB (ref >59)
GLOBULIN, TOTAL: 2.5 g/dL (ref 1.5–4.5)
GLUCOSE: 120 mg/dL — AB (ref 65–99)
POTASSIUM: 4.7 mmol/L (ref 3.5–5.2)
SODIUM: 141 mmol/L (ref 134–144)
Total Protein: 6.6 g/dL (ref 6.0–8.5)

## 2016-02-21 LAB — LIPID PANEL
CHOL/HDL RATIO: 2.5 ratio (ref 0.0–4.4)
Cholesterol, Total: 150 mg/dL (ref 100–199)
HDL: 61 mg/dL (ref >39)
LDL Calculated: 54 mg/dL (ref 0–99)
TRIGLYCERIDES: 175 mg/dL — AB (ref 0–149)
VLDL Cholesterol Cal: 35 mg/dL (ref 5–40)

## 2016-03-09 ENCOUNTER — Other Ambulatory Visit: Payer: Self-pay | Admitting: Family

## 2016-03-09 ENCOUNTER — Other Ambulatory Visit: Payer: Self-pay | Admitting: Family Medicine

## 2016-03-11 ENCOUNTER — Other Ambulatory Visit: Payer: Self-pay

## 2016-03-11 MED ORDER — LORAZEPAM 0.5 MG PO TABS: ORAL_TABLET | ORAL | Status: DC

## 2016-03-11 NOTE — Telephone Encounter (Signed)
Patient last seen in office on 02-20-16. Rx last filled on 11-06-2015 for #40. Please advise. If approved please route to pool A so nurse can phone in to [facility]

## 2016-04-03 ENCOUNTER — Ambulatory Visit (INDEPENDENT_AMBULATORY_CARE_PROVIDER_SITE_OTHER): Payer: Medicare Other

## 2016-04-03 ENCOUNTER — Encounter: Payer: Self-pay | Admitting: Family Medicine

## 2016-04-03 ENCOUNTER — Ambulatory Visit (INDEPENDENT_AMBULATORY_CARE_PROVIDER_SITE_OTHER): Payer: Medicare Other | Admitting: Family Medicine

## 2016-04-03 VITALS — BP 157/66 | HR 60 | Temp 97.2°F | Ht 66.0 in | Wt 172.0 lb

## 2016-04-03 DIAGNOSIS — R350 Frequency of micturition: Secondary | ICD-10-CM

## 2016-04-03 DIAGNOSIS — M545 Low back pain, unspecified: Secondary | ICD-10-CM

## 2016-04-03 DIAGNOSIS — M47817 Spondylosis without myelopathy or radiculopathy, lumbosacral region: Secondary | ICD-10-CM | POA: Diagnosis not present

## 2016-04-03 DIAGNOSIS — M4120 Other idiopathic scoliosis, site unspecified: Secondary | ICD-10-CM | POA: Diagnosis not present

## 2016-04-03 LAB — URINALYSIS, COMPLETE
BILIRUBIN UA: NEGATIVE
GLUCOSE, UA: NEGATIVE
KETONES UA: NEGATIVE
NITRITE UA: POSITIVE — AB
PH UA: 5.5 (ref 5.0–7.5)
SPEC GRAV UA: 1.02 (ref 1.005–1.030)
UUROB: 0.2 mg/dL (ref 0.2–1.0)

## 2016-04-03 LAB — MICROSCOPIC EXAMINATION: WBC, UA: >30 /hpf — AB (ref 0–?)

## 2016-04-03 MED ORDER — NITROFURANTOIN MONOHYD MACRO 100 MG PO CAPS: 100.0000 mg | ORAL_CAPSULE | Freq: Two times a day (BID) | ORAL | Status: DC

## 2016-04-03 MED ORDER — CELECOXIB 200 MG PO CAPS: 200.0000 mg | ORAL_CAPSULE | Freq: Every day | ORAL | Status: DC

## 2016-04-03 NOTE — Addendum Note (Signed)
Addended by: Marin Olp on: 04/03/2016 04:58 PM   Modules accepted: Orders

## 2016-04-03 NOTE — Progress Notes (Signed)
Subjective:  [name], female    DOB: [birth year]  Age: 82 y.o. MRN: [id number]  CC: Back Pain; GAD; and Abdominal Pain   HPI Tarissa Bucknam Rieth presents for Patient in today for acute care clinic visit. Daughter is concerned about her losing cognition and memory. The main concern today that is she is having lower back pain. The patient points to the right SI area as the point of maximal pain. She is unable to quantify the pain and unable to describe the sensation. She does mention some lower abdominal/suprapubic pain for a few days. The daughter says that she has that kind of arthritis that makes her bend over. Additionally she has a condition of her spine that it would kill her if she got hit in the spine with a baseball bat or similar blow.  MMSE - Mini Mental State Exam 04/03/2016  Orientation to time 5  Orientation to Place 5  Registration 3  Attention/ Calculation 5  Recall 3  Language- name 2 objects 2  Language- repeat 1  Language- follow 3 step command (No Data)  Language- follow 3 step command-comments pt is unable to read     History Oluwanifemi has a past medical history of Anemia; Hypertension; Iron deficiency anemia (05/27/2011); CHF (congestive heart failure) ([place]); CVA (cerebral infarction); Carotid stenosis; Renal insufficiency; Furuncle of back, except buttock; Vertigo; CAD (coronary artery disease); Chronic kidney infection; Arthritis; Myocardial infarction ([place]); Balance problems; Speech problem; Vision loss; Hearing loss; Shortness of breath dyspnea; H/O seasonal allergies; Pneumonia; Asthma; Swallowing difficulty; Diabetes mellitus; Anxiety; Depression; GERD (gastroesophageal reflux disease); and Dementia.   She has past surgical history that includes I & D of Furuncle (April 2013); Coronary artery bypass graft (2006); Coronary angioplasty; Total abdominal hysterectomy (~82 years old); Cholecystectomy; Appendectomy; Cataract extraction (Bilateral, 5 years  ago); Total knee arthroplasty (Right, 08/13/2015); and Knee surgery (Right).   Her family history includes Cancer in her brother; Diabetes in her sister and sister; Early death in her sister; Heart attack in her brother, brother, and sister; Heart disease in her brother, brother, and brother; Hypertension in her sister; Osteoporosis in her sister.She reports that she has never smoked. She has never used smokeless tobacco. She reports that she does not drink alcohol or use illicit drugs.    ROS Review of Systems  Constitutional: Positive for activity change. Negative for fever and appetite change.  HENT: Negative for congestion, rhinorrhea and sore throat.   Eyes: Negative for visual disturbance.  Respiratory: Negative for cough and shortness of breath.   Cardiovascular: Negative for chest pain and palpitations.  Gastrointestinal: Positive for abdominal pain (see HPI). Negative for nausea and diarrhea.  Genitourinary: Negative for dysuria.  Musculoskeletal: Positive for back pain and arthralgias. Negative for myalgias.  Psychiatric/Behavioral: Positive for confusion and decreased concentration. The patient is nervous/anxious.     Objective:  BP 157/66 mmHg  Pulse 60  Temp(Src) 97.2 F (36.2 C) (Oral)  Ht 5\' 6"  (1.676 m)  Wt 172 lb (78.019 kg)  BMI 27.77 kg/m2  SpO2 99%  BP Readings from Last 3 Encounters:  04/03/16 157/66  02/20/16 158/67  02/03/16 139/57    Wt Readings from Last 3 Encounters:  04/03/16 172 lb (78.019 kg)  02/20/16 170 lb (77.111 kg)  02/03/16 171 lb (77.565 kg)     Physical Exam  Constitutional: She is oriented to person, place, and time. She appears well-nourished. No distress.  Frail   HENT:  Head: Normocephalic and atraumatic.  Eyes: Conjunctivae are normal. Pupils are equal, round, and reactive to light.  Neck: Normal range of motion. Neck supple. No thyromegaly present.  Cardiovascular: Normal rate, regular rhythm and normal heart sounds.   No  murmur heard. Pulmonary/Chest: Effort normal and breath sounds normal. No respiratory distress. She has no wheezes. She has no rales.  Abdominal: Soft. Bowel sounds are normal. She exhibits no distension. There is no tenderness.  Musculoskeletal: Normal range of motion.  Lymphadenopathy:    She has no cervical adenopathy.  Neurological: She is alert and oriented to person, place, and time. Coordination abnormal.  Weak, unsteady gait - using walker - stooped posture   Skin: Skin is warm and dry. She is not diaphoretic.     Lab Results  Component Value Date   WBC 6.2 11/13/2015   HGB 11.1* 11/13/2015   HCT 32.9* 11/13/2015   PLT 202 11/13/2015   GLUCOSE 120* 02/20/2016   CHOL 150 02/20/2016   TRIG 175* 02/20/2016   HDL 61 02/20/2016   LDLCALC 54 02/20/2016   ALT 13 02/20/2016   AST 14 02/20/2016   NA 141 02/20/2016   K 4.7 02/20/2016   CL 101 02/20/2016   CREATININE 1.09* 02/20/2016   BUN 26 02/20/2016   CO2 26 02/20/2016   TSH 1.707 09/17/2015   INR 1.09 08/06/2015   HGBA1C 11.2* 11/13/2015    No results found.  Assessment & Plan:   Julieonna was seen today for back pain, gad and abdominal pain.  Diagnoses and all orders for this visit:  Urinary frequency -     Urinalysis, Complete -     DG Lumbar Spine 2-3 Views; Future -     DG Pelvis 1-2 Views; Future  Right-sided low back pain without sciatica  Scoliosis (and kyphoscoliosis), idiopathic  Lumbosacral spondylosis without myelopathy  Other orders -     celecoxib (CELEBREX) 200 MG capsule; Take 1 capsule (200 mg total) by mouth daily. With food -     nitrofurantoin, macrocrystal-monohydrate, (MACROBID) 100 MG capsule; Take 1 capsule (100 mg total) by mouth 2 (two) times daily.    . Urine shows >30 WBC, Many bacteria   I am having Ms. Doukas start on celecoxib and nitrofurantoin (macrocrystal-monohydrate). I am also having her maintain her Artificial Tear Ointment (DRY EYES OP), Multiple Vitamins-Iron  (DAILY VITAMINS/IRON/BETA CAROT PO), furosemide, EASY TOUCH INSULIN SYRINGE, nitroGLYCERIN, meclizine, docusate sodium, ferrous sulfate, HYDROcodone-acetaminophen, polyethylene glycol, traMADol, esomeprazole, clopidogrel, Insulin Detemir, Insulin Pen Needle, HUMULIN N, linaclotide, fluocinolone, Fluocinonide, metoprolol, lisinopril, mupirocin ointment, isosorbide mononitrate, amLODipine, PARoxetine, HUMALOG KWIKPEN, NITROSTAT, atorvastatin, metFORMIN, and LORazepam.  Meds ordered this encounter  Medications  . celecoxib (CELEBREX) 200 MG capsule    Sig: Take 1 capsule (200 mg total) by mouth daily. With food    Dispense:  30 capsule    Refill:  5  . nitrofurantoin, macrocrystal-monohydrate, (MACROBID) 100 MG capsule    Sig: Take 1 capsule (100 mg total) by mouth 2 (two) times daily.    Dispense:  14 capsule    Refill:  0     Follow-up: Return if symptoms worsen or fail to improve.  [name], M.D.

## 2016-04-05 ENCOUNTER — Other Ambulatory Visit: Payer: Self-pay | Admitting: Family Medicine

## 2016-04-05 LAB — URINE CULTURE

## 2016-04-05 MED ORDER — DOXYCYCLINE HYCLATE 100 MG PO CAPS: 100.0000 mg | ORAL_CAPSULE | Freq: Two times a day (BID) | ORAL | Status: DC

## 2016-04-06 ENCOUNTER — Other Ambulatory Visit: Payer: Self-pay | Admitting: Pediatrics

## 2016-04-08 ENCOUNTER — Other Ambulatory Visit: Payer: Self-pay | Admitting: *Deleted

## 2016-04-08 MED ORDER — INSULIN DETEMIR 100 UNIT/ML FLEXPEN: 28.0000 [IU] | PEN_INJECTOR | Freq: Every morning | SUBCUTANEOUS | Status: DC

## 2016-04-10 ENCOUNTER — Other Ambulatory Visit: Payer: Self-pay | Admitting: Family Medicine

## 2016-04-16 ENCOUNTER — Telehealth: Payer: Self-pay | Admitting: Family

## 2016-05-05 ENCOUNTER — Other Ambulatory Visit: Payer: Self-pay | Admitting: Family Medicine

## 2016-05-06 ENCOUNTER — Other Ambulatory Visit: Payer: Self-pay

## 2016-05-06 NOTE — Telephone Encounter (Signed)
Patient last seen in office on 04-03-16. Rx last filled on 11-06-15 for #40. Please advise and route to pool A so nurse can phone in to pharmacy

## 2016-05-07 MED ORDER — LORAZEPAM 0.5 MG PO TABS: ORAL_TABLET | ORAL | Status: DC

## 2016-05-08 ENCOUNTER — Ambulatory Visit (INDEPENDENT_AMBULATORY_CARE_PROVIDER_SITE_OTHER): Payer: Medicare Other | Admitting: Family

## 2016-05-08 ENCOUNTER — Encounter: Payer: Self-pay | Admitting: Family

## 2016-05-08 VITALS — BP 150/72 | HR 52 | Temp 96.9°F | Ht 66.0 in | Wt 168.2 lb

## 2016-05-08 DIAGNOSIS — S80862A Insect bite (nonvenomous), left lower leg, initial encounter: Secondary | ICD-10-CM | POA: Diagnosis not present

## 2016-05-08 DIAGNOSIS — E663 Overweight: Secondary | ICD-10-CM | POA: Diagnosis not present

## 2016-05-08 DIAGNOSIS — W57XXXA Bitten or stung by nonvenomous insect and other nonvenomous arthropods, initial encounter: Secondary | ICD-10-CM | POA: Diagnosis not present

## 2016-05-08 MED ORDER — DOXYCYCLINE HYCLATE 100 MG PO TABS: 200.0000 mg | ORAL_TABLET | Freq: Once | ORAL | Status: DC

## 2016-05-08 NOTE — Progress Notes (Addendum)
   Subjective:    [name], female    DOB: Sep 09, 1931, 82 y.o.   MRN: [id number]  HPI PT presents to the office today for a tick bite. PT states she has a tick on her left lower leg that she noticed Wednesday night (05/06/16). Pt states she now has a red rash where she had the tick. PT denies drainage, new joint pain, or pain.    Review of Systems  HENT: Negative.   Musculoskeletal: Positive for arthralgias.  Skin: Positive for rash.       Objective:   Physical Exam  Constitutional: She is oriented to person, place, and time. She appears well-developed and well-nourished. No distress.  HENT:  Head: Normocephalic and atraumatic.  Eyes: Pupils are equal, round, and reactive to light.  Neck: Normal range of motion. Neck supple. No thyromegaly present.  Cardiovascular: Normal rate, regular rhythm and intact distal pulses.   Murmur heard. Pulmonary/Chest: Effort normal and breath sounds normal. No respiratory distress. She has no wheezes.  Abdominal: Soft. Bowel sounds are normal. She exhibits no distension. There is no tenderness.  Musculoskeletal: Normal range of motion. She exhibits no edema or tenderness.  Neurological: She is alert and oriented to person, place, and time. No cranial nerve deficit.  Skin: Skin is warm and dry. Rash noted. There is erythema.  Generalized erythemas rash on left lower leg, 7cmX3cm. NO warmth, swelling, or drainage present. Area marked   Psychiatric: She has a normal mood and affect. Her behavior is normal. Judgment and thought content normal.  Vitals reviewed.    BP 150/72 mmHg  Pulse 52  Temp(Src) 96.9 F (36.1 C) (Oral)  Ht 5\' 6"  (1.676 m)  Wt 168 lb 3.2 oz (76.295 kg)  BMI 27.16 kg/m2      Assessment & Plan:  1. Tick bite of lower leg, left, initial encounter --Pt to report any new fever, joint pain, or rash -Wear protective clothing while outside- Long sleeves and long pants -Put insect repellent on all exposed  skin and along clothing -Take a shower as soon as possible after being outside -Area marked, if erythemas increases RTO  - doxycycline (VIBRA-TABS) 100 MG tablet; Take 2 tablets (200 mg total) by mouth once.  Dispense: 2 tablet; Refill: 0  2. Overweight (BMI 25.0-29.9)  Evelina Dun, FNP

## 2016-05-08 NOTE — Patient Instructions (Signed)
Tick Bite Information Ticks are insects that attach themselves to the skin and draw blood for food. There are various types of ticks. Common types include wood ticks and deer ticks. Most ticks live in shrubs and grassy areas. Ticks can climb onto your body when you make contact with leaves or grass where the tick is waiting. The most common places on the body for ticks to attach themselves are the scalp, neck, armpits, waist, and groin. Most tick bites are harmless, but sometimes ticks carry germs that cause diseases. These germs can be spread to a person during the tick's feeding process. The chance of a disease spreading through a tick bite depends on:   The type of tick.  Time of year.   How long the tick is attached.   Geographic location.  HOW CAN YOU PREVENT TICK BITES? Take these steps to help prevent tick bites when you are outdoors:  Wear protective clothing. Long sleeves and long pants are best.   Wear white clothes so you can see ticks more easily.  Tuck your pant legs into your socks.   If walking on a trail, stay in the middle of the trail to avoid brushing against bushes.  Avoid walking through areas with long grass.  Put insect repellent on all exposed skin and along boot tops, pant legs, and sleeve cuffs.   Check clothing, hair, and skin repeatedly and before going inside.   Brush off any ticks that are not attached.  Take a shower or bath as soon as possible after being outdoors.  WHAT IS THE PROPER WAY TO REMOVE A TICK? Ticks should be removed as soon as possible to help prevent diseases caused by tick bites. 1. If latex gloves are available, put them on before trying to remove a tick.  2. Using fine-point tweezers, grasp the tick as close to the skin as possible. You may also use curved forceps or a tick removal tool. Grasp the tick as close to its head as possible. Avoid grasping the tick on its body. 3. Pull gently with steady upward pressure until  the tick lets go. Do not twist the tick or jerk it suddenly. This may break off the tick's head or mouth parts. 4. Do not squeeze or crush the tick's body. This could force disease-carrying fluids from the tick into your body.  5. After the tick is removed, wash the bite area and your hands with soap and water or other disinfectant such as alcohol. 6. Apply a small amount of antiseptic cream or ointment to the bite site.  7. Wash and disinfect any instruments that were used.  Do not try to remove a tick by applying a hot match, petroleum jelly, or fingernail polish to the tick. These methods do not work and may increase the chances of disease being spread from the tick bite.  WHEN SHOULD YOU SEEK MEDICAL CARE? Contact your health care provider if you are unable to remove a tick from your skin or if a part of the tick breaks off and is stuck in the skin.  After a tick bite, you need to be aware of signs and symptoms that could be related to diseases spread by ticks. Contact your health care provider if you develop any of the following in the days or weeks after the tick bite:  Unexplained fever.  Rash. A circular rash that appears days or weeks after the tick bite may indicate the possibility of Lyme disease. The rash may resemble   a target with a bull's-eye and may occur at a different part of your body than the tick bite.  Redness and swelling in the area of the tick bite.   Tender, swollen lymph glands.   Diarrhea.   Weight loss.   Cough.   Fatigue.   Muscle, joint, or bone pain.   Abdominal pain.   Headache.   Lethargy or a change in your level of consciousness.  Difficulty walking or moving your legs.   Numbness in the legs.   Paralysis.  Shortness of breath.   Confusion.   Repeated vomiting.    This information is not intended to replace advice given to you by your health care provider. Make sure you discuss any questions you have with your health  care provider.   Document Released: 11/20/2000 Document Revised: 12/14/2014 Document Reviewed: 05/03/2013 Elsevier Interactive Patient Education 2016 [employer]  

## 2016-05-19 DIAGNOSIS — Z961 Presence of intraocular lens: Secondary | ICD-10-CM | POA: Diagnosis not present

## 2016-05-19 DIAGNOSIS — H53462 Homonymous bilateral field defects, left side: Secondary | ICD-10-CM | POA: Diagnosis not present

## 2016-05-19 DIAGNOSIS — E119 Type 2 diabetes mellitus without complications: Secondary | ICD-10-CM | POA: Diagnosis not present

## 2016-05-19 LAB — HM DIABETES EYE EXAM

## 2016-06-01 ENCOUNTER — Other Ambulatory Visit: Payer: Self-pay | Admitting: Family Medicine

## 2016-06-02 ENCOUNTER — Encounter: Payer: Self-pay | Admitting: Family Medicine

## 2016-06-02 ENCOUNTER — Ambulatory Visit (INDEPENDENT_AMBULATORY_CARE_PROVIDER_SITE_OTHER): Payer: Medicare Other | Admitting: Family Medicine

## 2016-06-02 VITALS — BP 129/62 | HR 50 | Temp 97.3°F | Ht 66.0 in | Wt 168.0 lb

## 2016-06-02 DIAGNOSIS — E785 Hyperlipidemia, unspecified: Secondary | ICD-10-CM

## 2016-06-02 DIAGNOSIS — F0390 Unspecified dementia without behavioral disturbance: Secondary | ICD-10-CM | POA: Diagnosis not present

## 2016-06-02 DIAGNOSIS — E1100 Type 2 diabetes mellitus with hyperosmolarity without nonketotic hyperglycemic-hyperosmolar coma (NKHHC): Secondary | ICD-10-CM | POA: Diagnosis not present

## 2016-06-02 DIAGNOSIS — I10 Essential (primary) hypertension: Secondary | ICD-10-CM

## 2016-06-02 DIAGNOSIS — D509 Iron deficiency anemia, unspecified: Secondary | ICD-10-CM

## 2016-06-02 LAB — BAYER DCA HB A1C WAIVED: HB A1C (BAYER DCA - WAIVED): 9.1 % — ABNORMAL HIGH (ref <7.0)

## 2016-06-02 MED ORDER — CLOPIDOGREL BISULFATE 75 MG PO TABS: ORAL_TABLET | ORAL | Status: DC

## 2016-06-02 MED ORDER — PAROXETINE HCL 20 MG PO TABS: ORAL_TABLET | ORAL | Status: DC

## 2016-06-02 MED ORDER — ATORVASTATIN CALCIUM 20 MG PO TABS: ORAL_TABLET | ORAL | Status: DC

## 2016-06-02 MED ORDER — LINACLOTIDE 145 MCG PO CAPS: 145.0000 ug | ORAL_CAPSULE | Freq: Every day | ORAL | Status: DC

## 2016-06-02 MED ORDER — METOPROLOL TARTRATE 50 MG PO TABS: ORAL_TABLET | ORAL | Status: DC

## 2016-06-02 MED ORDER — FERROUS SULFATE 325 (65 FE) MG PO TABS: 325.0000 mg | ORAL_TABLET | Freq: Three times a day (TID) | ORAL | Status: DC

## 2016-06-02 MED ORDER — FUROSEMIDE 20 MG PO TABS: 20.0000 mg | ORAL_TABLET | Freq: Every day | ORAL | Status: DC | PRN

## 2016-06-02 MED ORDER — METFORMIN HCL ER 500 MG PO TB24: ORAL_TABLET | ORAL | Status: DC

## 2016-06-02 MED ORDER — LISINOPRIL 20 MG PO TABS: ORAL_TABLET | ORAL | Status: DC

## 2016-06-02 MED ORDER — AMLODIPINE BESYLATE 10 MG PO TABS: 10.0000 mg | ORAL_TABLET | Freq: Every day | ORAL | Status: DC

## 2016-06-02 MED ORDER — ESOMEPRAZOLE MAGNESIUM 40 MG PO CPDR: DELAYED_RELEASE_CAPSULE | ORAL | Status: DC

## 2016-06-02 NOTE — Patient Instructions (Signed)
Medicare Annual Wellness Visit  [place] and the medical providers at [facility] strive to bring you the best medical care.  In doing so we not only want to address your current medical conditions and concerns but also to detect new conditions early and prevent illness, disease and health-related problems.    Medicare offers a yearly Wellness Visit which allows our clinical staff to assess your need for preventative services including immunizations, lifestyle education, counseling to decrease risk of preventable diseases and screening for fall risk and other medical concerns.    This visit is provided free of charge (no copay) for all Medicare recipients. The clinical pharmacists at [facility] have begun to conduct these Wellness Visits which will also include a thorough review of all your medications.    As you primary medical provider recommend that you make an appointment for your Annual Wellness Visit if you have not done so already this year.  You may set up this appointment before you leave today or you may call back ([phone]) and schedule an appointment.  Please make sure when you call that you mention that you are scheduling your Annual Wellness Visit with the clinical pharmacist so that the appointment may be made for the proper length of time.     Continue current medications. Continue good therapeutic lifestyle changes which include good diet and exercise. Fall precautions discussed with patient. If an FOBT was given today- please return it to our front desk. If you are over 50 years old - you may need Prevnar 13 or the adult Pneumonia vaccine.  **Flu shots are available--- please call and schedule a FLU-CLINIC appointment**  After your visit with us today you will receive a survey in the mail or online from [employer] regarding your care with us. Please take a moment to fill this out. Your feedback is very  important to us as you can help us better understand your patient needs as well as improve your experience and satisfaction. WE CARE ABOUT YOU!!!    

## 2016-06-02 NOTE — Progress Notes (Signed)
Subjective:    [name], female    DOB: 18-Sep-1931, 82 y.o.   MRN: [id number]  HPI Pt here for follow up and management of chronic medical problems which includes diabetes and hyperlipidemia. She is taking medications regularly. Patient's chronic issues continue. She is cared for by her daughters. Her weight is stable since last visit but she appears to me to have lost some weight. Her complaint today is abdominal pain. I think she has issues with constipation and have tried in the past and again today to get her on the bulk fiber supplement. I suspect she also has some diverticular disease with her constipation. She also has a lesion on her nose that she is concerned about. Her last A1c 3 months ago was 8.6 I suspect we will not get much better than that given her eating habits. Did notices she is on Celebrex and given her diabetes and hypertension have asked her to stop that and use Tylenol in its place. Also we discussed possibility of stopping atorvastatin given her age in an effort to try and stop nonessential medicines at her age. She will approach this issue with her cardiologist     Patient Active Problem List   Diagnosis Date Noted  . Overweight (BMI 25.0-29.9) 05/08/2016  . UTI (lower urinary tract infection) 11/12/2015  . DM hyperosmolarity type II, uncontrolled ([place]) 11/12/2015  . Dementia   . Encephalopathy, metabolic A999333  . S/P right TKA 08/13/2015  . S/P knee replacement 08/13/2015  . Elevated troponin 03/06/2015  . Fever   . History of diabetes mellitus   . Left buttock pain   . Hyperlipidemia 01/11/2015  . GAD (generalized anxiety disorder) 01/11/2015  . Depression 01/11/2015  . GERD (gastroesophageal reflux disease) 01/11/2015  . Osteopenia 07/13/2014  . Obesity (BMI 30-39.9) 06/19/2014  . Carotid stenosis   . Anemia of chronic disease 12/25/2011  . Iron deficiency anemia 05/27/2011  . Diastolic CHF, chronic ([place]) 04/02/2010  . Type  2 diabetes mellitus with insulin therapy ([place]) 10/08/2009  . DYSLIPIDEMIA 10/08/2009  . Essential hypertension 10/08/2009  . Coronary atherosclerosis 10/08/2009  . CAROTID STENOSIS 10/08/2009   Outpatient Encounter Prescriptions as of 06/02/2016  Medication Sig  . amLODipine (NORVASC) 10 MG tablet Take 1 tablet (10 mg total) by mouth daily.  . Artificial Tear Ointment (DRY EYES OP) Apply 1 drop to eye daily as needed. For dry eye/ red eye   . atorvastatin (LIPITOR) 20 MG tablet TAKE (1) TABLET DAILY AT BEDTIME.  . celecoxib (CELEBREX) 200 MG capsule Take 1 capsule (200 mg total) by mouth daily. With food  . clopidogrel (PLAVIX) 75 MG tablet TAKE (1) TABLET BY MOUTH ONCE DAILY.  Marland Kitchen docusate sodium (COLACE) 100 MG capsule Take 1 capsule (100 mg total) by mouth 2 (two) times daily.  Marland Kitchen doxycycline (VIBRA-TABS) 100 MG tablet Take 2 tablets (200 mg total) by mouth once.  Marland Kitchen EASY TOUCH INSULIN SYRINGE 31G X 5/16" 1 ML MISC USE AS DIRECTED.  Marland Kitchen EASY TOUCH PEN NEEDLES 31G X 8 MM MISC AS DIRECTED  . esomeprazole (NEXIUM) 40 MG capsule TAKE 1 CAPSULE BY MOUTH ONCE A DAY.  . ferrous sulfate 325 (65 FE) MG tablet Take 1 tablet (325 mg total) by mouth 3 (three) times daily after meals.  . fluocinolone (VANOS) 0.01 % cream Apply topically 2 (two) times daily.  . Fluocinonide 0.1 % CREA   . furosemide (LASIX) 20 MG tablet Take 1 tablet (20 mg total) by  mouth daily as needed for fluid or edema.  Marland Kitchen HUMALOG KWIKPEN 100 UNIT/ML KiwkPen USE PER SLIDING SCALE NOTIFY MD OF RESULTS UNDER 60 OR OVER 300.  Marland Kitchen HUMULIN N 100 UNIT/ML injection Inject 28 Units into the skin every morning.  Marland Kitchen HYDROcodone-acetaminophen (NORCO) 7.5-325 MG per tablet Take 1-2 tablets by mouth every 4 (four) hours as needed for moderate pain.  . Insulin Detemir (LEVEMIR) 100 UNIT/ML Pen Inject 28 Units into the skin every morning.  . Insulin Pen Needle 31G X 5 MM MISC Use with  Levemir injection  . isosorbide mononitrate (IMDUR) 30 MG 24 hr  tablet TAKE 1 TABLET ONCE DAILY.  Marland Kitchen linaclotide (LINZESS) 145 MCG CAPS capsule Take 1 capsule (145 mcg total) by mouth daily. To regulate bowel movements  . lisinopril (PRINIVIL,ZESTRIL) 20 MG tablet TAKE (1) TABLET BY MOUTH ONCE DAILY.  Marland Kitchen LORazepam (ATIVAN) 0.5 MG tablet TAKE (1) TABLET TWICE DAILY as needed for anxiety  . meclizine (ANTIVERT) 25 MG tablet TAKE 1 TABLET TWICE DAILY AS NEEDED FOR DIZZINESS. (Patient taking differently: TAKE 1 TABLET TWICE DAILY FOR DIZZINESS.)  . metFORMIN (GLUCOPHAGE-XR) 500 MG 24 hr tablet TAKE 1 TABLET WITH BREAKFAST AND 1 TABLET WITH SUPPER.  . metoprolol (LOPRESSOR) 50 MG tablet TAKE (1) TABLET TWICE DAILY.  . Multiple Vitamins-Iron (DAILY VITAMINS/IRON/BETA CAROT PO) Take 1 tablet by mouth at bedtime.  . mupirocin ointment (BACTROBAN) 2 % Place 1 application into the nose 2 (two) times daily.  . nitroGLYCERIN (NITROSTAT) 0.4 MG SL tablet PLACE ONE (1) TABLET UNDER TONGUE EVERY 5 MINUTES UP TO (3) DOSES AS NEEDED FOR CHEST PAIN.  Marland Kitchen NITROSTAT 0.4 MG SL tablet PLACE 1 TABLET UNDER THE TONGUE AS DIRECTED FOR CHEST PAIN  . PARoxetine (PAXIL) 20 MG tablet TAKE (1) TABLET BY MOUTH ONCE DAILY.  Marland Kitchen polyethylene glycol (MIRALAX / GLYCOLAX) packet Take 17 g by mouth 2 (two) times daily. (Patient taking differently: Take 17 g by mouth daily as needed for mild constipation. )  . traMADol (ULTRAM) 50 MG tablet Take one tablet by mouth every 6 hours as needed for pain. Monitor for increased confusion, sedation, allergic reaction (Patient taking differently: Take 50 mg by mouth every 6 (six) hours as needed for moderate pain. Take one tablet by mouth every 6 hours as needed for pain. Monitor for increased confusion, sedation, allergic reaction)  . [DISCONTINUED] amLODipine (NORVASC) 10 MG tablet TAKE 1 TABLET ONCE DAILY.  . [DISCONTINUED] atorvastatin (LIPITOR) 20 MG tablet TAKE (1) TABLET DAILY AT BEDTIME.  . [DISCONTINUED] clopidogrel (PLAVIX) 75 MG tablet TAKE (1) TABLET BY  MOUTH ONCE DAILY.  . [DISCONTINUED] esomeprazole (NEXIUM) 40 MG capsule TAKE 1 CAPSULE BY MOUTH ONCE A DAY. (Patient taking differently: TAKE 1 CAPSULE BY MOUTH ONCE A DAILY AS NEEDED FOR ACID REFLUX)  . [DISCONTINUED] ferrous sulfate 325 (65 FE) MG tablet Take 1 tablet (325 mg total) by mouth 3 (three) times daily after meals. (Patient taking differently: Take 325 mg by mouth 2 (two) times daily with a meal. )  . [DISCONTINUED] furosemide (LASIX) 20 MG tablet Take 1 tablet (20 mg total) by mouth daily as needed for fluid or edema.  . [DISCONTINUED] Linaclotide (LINZESS) 145 MCG CAPS capsule Take 1 capsule (145 mcg total) by mouth daily. To regulate bowel movements  . [DISCONTINUED] lisinopril (PRINIVIL,ZESTRIL) 20 MG tablet TAKE (1) TABLET BY MOUTH ONCE DAILY.  . [DISCONTINUED] metFORMIN (GLUCOPHAGE-XR) 500 MG 24 hr tablet TAKE 1 TABLET WITH BREAKFAST AND 1 TABLET WITH SUPPER.  . [  DISCONTINUED] metoprolol (LOPRESSOR) 50 MG tablet TAKE (1) TABLET TWICE DAILY.  . [DISCONTINUED] PARoxetine (PAXIL) 20 MG tablet TAKE (1) TABLET BY MOUTH ONCE DAILY.   No facility-administered encounter medications on file as of 06/02/2016.      Review of Systems  Constitutional: Negative.   HENT: Negative.   Eyes: Negative.   Respiratory: Negative.   Cardiovascular: Negative.   Gastrointestinal: Positive for abdominal pain (lower with abnormal bowel habits).  Endocrine: Negative.   Genitourinary: Negative.   Musculoskeletal: Negative.   Skin: Negative.        Lesion on nose  Allergic/Immunologic: Negative.   Neurological: Negative.   Hematological: Negative.   Psychiatric/Behavioral: Negative.        Objective:   Physical Exam  Constitutional: She is oriented to person, place, and time. She appears well-developed and well-nourished.  Cardiovascular: Normal rate, regular rhythm and normal heart sounds.   Pulmonary/Chest: Effort normal and breath sounds normal.  Abdominal: Soft. Bowel sounds are normal.  She exhibits distension.  Neurological: She is alert and oriented to person, place, and time.  Psychiatric: She has a normal mood and affect. Her behavior is normal.   BP 129/62 mmHg  Pulse 50  Temp(Src) 97.3 F (36.3 C) (Oral)  Ht 5\' 6"  (1.676 m)  Wt 168 lb (76.204 kg)  BMI 27.13 kg/m2        Assessment & Plan:  1. Uncontrolled type 2 diabetes mellitus with hyperosmolarity without coma, without long-term current use of insulin ([place]) Continue with metformin and Levemir - Bayer DCA Hb A1c Waived  2. Hyperlipidemia Patient is status post coronary event and CABG but I think she could potentially stop the atorvastatin depending on cardiology's preference  3. Iron deficiency anemia No symptoms here  4. Essential hypertension Blood pressure is well controlled on current regimen of amlodipine.  5. Essential hypertension, benign See above - furosemide (LASIX) 20 MG tablet; Take 1 tablet (20 mg total) by mouth daily as needed for fluid or edema.  Dispense: 30 tablet; Refill: 5  6. Dementia, without behavioral disturbance Thinks he does okay in her current environment she is showing some signs of dysphagia incontinence. Routines are important.  Wardell Honour MD

## 2016-06-11 ENCOUNTER — Ambulatory Visit (INDEPENDENT_AMBULATORY_CARE_PROVIDER_SITE_OTHER): Payer: Medicare Other | Admitting: Family Medicine

## 2016-06-11 ENCOUNTER — Encounter: Payer: Self-pay | Admitting: Family Medicine

## 2016-06-11 VITALS — BP 153/58 | HR 60 | Temp 97.4°F | Ht 66.0 in | Wt 167.6 lb

## 2016-06-11 DIAGNOSIS — C44311 Basal cell carcinoma of skin of nose: Secondary | ICD-10-CM | POA: Diagnosis not present

## 2016-06-11 NOTE — Addendum Note (Signed)
Addended by: Jamelle Haring on: 06/11/2016 02:19 PM   Modules accepted: Orders

## 2016-06-11 NOTE — Progress Notes (Signed)
Subjective:    [name], female    DOB: [birth year], 82 y.o.   MRN: [id number]  HPI at last visit patient was noted to have a lesion on her left side of her nose that looks like a basal cell skin cancer and it was suggested that she make appointment for removal.  Patient Active Problem List   Diagnosis Date Noted  . Overweight (BMI 25.0-29.9) 05/08/2016  . UTI (lower urinary tract infection) 11/12/2015  . DM hyperosmolarity type II, uncontrolled ([place]) 11/12/2015  . Dementia   . Encephalopathy, metabolic A999333  . S/P right TKA 08/13/2015  . S/P knee replacement 08/13/2015  . Elevated troponin 03/06/2015  . Fever   . History of diabetes mellitus   . Left buttock pain   . Hyperlipidemia 01/11/2015  . GAD (generalized anxiety disorder) 01/11/2015  . Depression 01/11/2015  . GERD (gastroesophageal reflux disease) 01/11/2015  . Osteopenia 07/13/2014  . Obesity (BMI 30-39.9) 06/19/2014  . Carotid stenosis   . Anemia of chronic disease 12/25/2011  . Iron deficiency anemia 05/27/2011  . Diastolic CHF, chronic ([place]) 04/02/2010  . Type 2 diabetes mellitus with insulin therapy ([place]) 10/08/2009  . DYSLIPIDEMIA 10/08/2009  . Essential hypertension 10/08/2009  . Coronary atherosclerosis 10/08/2009  . CAROTID STENOSIS 10/08/2009   Outpatient Encounter Prescriptions as of 06/11/2016  Medication Sig  . amLODipine (NORVASC) 10 MG tablet Take 1 tablet (10 mg total) by mouth daily.  . Artificial Tear Ointment (DRY EYES OP) Apply 1 drop to eye daily as needed. For dry eye/ red eye   . atorvastatin (LIPITOR) 20 MG tablet TAKE (1) TABLET DAILY AT BEDTIME.  . celecoxib (CELEBREX) 200 MG capsule Take 1 capsule (200 mg total) by mouth daily. With food  . clopidogrel (PLAVIX) 75 MG tablet TAKE (1) TABLET BY MOUTH ONCE DAILY.  Marland Kitchen docusate sodium (COLACE) 100 MG capsule Take 1 capsule (100 mg total) by mouth 2 (two) times daily.  Marland Kitchen EASY TOUCH INSULIN SYRINGE 31G X 5/16" 1 ML MISC  USE AS DIRECTED.  Marland Kitchen EASY TOUCH PEN NEEDLES 31G X 8 MM MISC AS DIRECTED  . esomeprazole (NEXIUM) 40 MG capsule TAKE 1 CAPSULE BY MOUTH ONCE A DAY.  . ferrous sulfate 325 (65 FE) MG tablet Take 1 tablet (325 mg total) by mouth 3 (three) times daily after meals.  . fluocinolone (VANOS) 0.01 % cream Apply topically 2 (two) times daily.  . Fluocinonide 0.1 % CREA   . furosemide (LASIX) 20 MG tablet Take 1 tablet (20 mg total) by mouth daily as needed for fluid or edema.  Marland Kitchen HUMALOG KWIKPEN 100 UNIT/ML KiwkPen USE PER SLIDING SCALE NOTIFY MD OF RESULTS UNDER 60 OR OVER 300.  Marland Kitchen HUMULIN N 100 UNIT/ML injection Inject 28 Units into the skin every morning.  Marland Kitchen HYDROcodone-acetaminophen (NORCO) 7.5-325 MG per tablet Take 1-2 tablets by mouth every 4 (four) hours as needed for moderate pain.  . Insulin Detemir (LEVEMIR) 100 UNIT/ML Pen Inject 28 Units into the skin every morning.  . Insulin Pen Needle 31G X 5 MM MISC Use with  Levemir injection  . isosorbide mononitrate (IMDUR) 30 MG 24 hr tablet TAKE 1 TABLET ONCE DAILY.  Marland Kitchen linaclotide (LINZESS) 145 MCG CAPS capsule Take 1 capsule (145 mcg total) by mouth daily. To regulate bowel movements  . lisinopril (PRINIVIL,ZESTRIL) 20 MG tablet TAKE (1) TABLET BY MOUTH ONCE DAILY.  Marland Kitchen LORazepam (ATIVAN) 0.5 MG tablet TAKE (1) TABLET TWICE DAILY as needed for anxiety  .  meclizine (ANTIVERT) 25 MG tablet TAKE 1 TABLET TWICE DAILY AS NEEDED FOR DIZZINESS. (Patient taking differently: TAKE 1 TABLET TWICE DAILY FOR DIZZINESS.)  . metFORMIN (GLUCOPHAGE-XR) 500 MG 24 hr tablet TAKE 1 TABLET WITH BREAKFAST AND 1 TABLET WITH SUPPER.  . metoprolol (LOPRESSOR) 50 MG tablet TAKE (1) TABLET TWICE DAILY.  . Multiple Vitamins-Iron (DAILY VITAMINS/IRON/BETA CAROT PO) Take 1 tablet by mouth at bedtime.  . mupirocin ointment (BACTROBAN) 2 % Place 1 application into the nose 2 (two) times daily.  . nitroGLYCERIN (NITROSTAT) 0.4 MG SL tablet PLACE ONE (1) TABLET UNDER TONGUE EVERY 5  MINUTES UP TO (3) DOSES AS NEEDED FOR CHEST PAIN.  Marland Kitchen NITROSTAT 0.4 MG SL tablet PLACE 1 TABLET UNDER THE TONGUE AS DIRECTED FOR CHEST PAIN  . PARoxetine (PAXIL) 20 MG tablet TAKE (1) TABLET BY MOUTH ONCE DAILY.  Marland Kitchen polyethylene glycol (MIRALAX / GLYCOLAX) packet Take 17 g by mouth 2 (two) times daily. (Patient taking differently: Take 17 g by mouth daily as needed for mild constipation. )  . traMADol (ULTRAM) 50 MG tablet Take one tablet by mouth every 6 hours as needed for pain. Monitor for increased confusion, sedation, allergic reaction (Patient taking differently: Take 50 mg by mouth every 6 (six) hours as needed for moderate pain. Take one tablet by mouth every 6 hours as needed for pain. Monitor for increased confusion, sedation, allergic reaction)   No facility-administered encounter medications on file as of 06/11/2016.      Review of Systems     Objective:   Physical Exam BP 153/58 mmHg  Pulse 60  Temp(Src) 97.4 F (36.3 C) (Oral)  Ht 5\' 6"  (1.676 m)  Wt 167 lb 9.6 oz (76.023 kg)  BMI 27.06 kg/m2  Procedure note: Local anesthetic with epinephrine was injected under the lesion and then it was excised with a #15 blade scalpel. Base was curetted and then Hyfrecator applied for hemostasis. Specimen will be sent to pathology for analysis      Assessment & Plan:  Probable skin cancer, removed  Wardell Honour MD

## 2016-06-15 LAB — PATHOLOGY

## 2016-06-18 DIAGNOSIS — E1142 Type 2 diabetes mellitus with diabetic polyneuropathy: Secondary | ICD-10-CM | POA: Diagnosis not present

## 2016-06-18 DIAGNOSIS — B351 Tinea unguium: Secondary | ICD-10-CM | POA: Diagnosis not present

## 2016-06-18 DIAGNOSIS — L84 Corns and callosities: Secondary | ICD-10-CM | POA: Diagnosis not present

## 2016-06-23 ENCOUNTER — Encounter: Payer: Self-pay | Admitting: Family Medicine

## 2016-07-01 ENCOUNTER — Other Ambulatory Visit: Payer: Self-pay | Admitting: Family Medicine

## 2016-07-20 ENCOUNTER — Other Ambulatory Visit: Payer: Self-pay | Admitting: *Deleted

## 2016-07-20 MED ORDER — LORAZEPAM 0.5 MG PO TABS: ORAL_TABLET | ORAL | 1 refills | Status: DC

## 2016-07-20 NOTE — Telephone Encounter (Signed)
Last filled 07/01/16. 

## 2016-07-20 NOTE — Telephone Encounter (Signed)
Refill called to [facility] 

## 2016-07-24 ENCOUNTER — Ambulatory Visit (INDEPENDENT_AMBULATORY_CARE_PROVIDER_SITE_OTHER): Payer: Medicare Other | Admitting: Family Medicine

## 2016-07-24 ENCOUNTER — Encounter: Payer: Self-pay | Admitting: Family Medicine

## 2016-07-24 VITALS — BP 140/73 | HR 58 | Temp 98.2°F | Ht 66.0 in | Wt 168.0 lb

## 2016-07-24 DIAGNOSIS — R300 Dysuria: Secondary | ICD-10-CM | POA: Diagnosis not present

## 2016-07-24 LAB — URINALYSIS, COMPLETE
Bilirubin, UA: NEGATIVE
NITRITE UA: POSITIVE — AB
Specific Gravity, UA: 1.025 (ref 1.005–1.030)
Urobilinogen, Ur: 0.2 mg/dL (ref 0.2–1.0)
pH, UA: 5 (ref 5.0–7.5)

## 2016-07-24 LAB — MICROSCOPIC EXAMINATION

## 2016-07-24 MED ORDER — CIPROFLOXACIN HCL 250 MG PO TABS
250.0000 mg | ORAL_TABLET | Freq: Two times a day (BID) | ORAL | 0 refills | Status: DC
Start: 2016-07-24 — End: 2016-08-17

## 2016-07-24 NOTE — Progress Notes (Signed)
Subjective:    [name], female    DOB: 1931/02/10, 82 y.o.   MRN: [id number]  HPI patient has several day history of malodorous urine and some confusion with hallucinations. She also has chronic constipation as well as incontinence of both bowel and bladder. I think there is some underlying dementia since these complaints which would be trivial for most folks is causing more confusion and hallucinations.  Patient Active Problem List   Diagnosis Date Noted  . Overweight (BMI 25.0-29.9) 05/08/2016  . UTI (lower urinary tract infection) 11/12/2015  . DM hyperosmolarity type II, uncontrolled ([place]) 11/12/2015  . Dementia   . Encephalopathy, metabolic A999333  . S/P right TKA 08/13/2015  . S/P knee replacement 08/13/2015  . Elevated troponin 03/06/2015  . Fever   . History of diabetes mellitus   . Left buttock pain   . Hyperlipidemia 01/11/2015  . GAD (generalized anxiety disorder) 01/11/2015  . Depression 01/11/2015  . GERD (gastroesophageal reflux disease) 01/11/2015  . Osteopenia 07/13/2014  . Obesity (BMI 30-39.9) 06/19/2014  . Carotid stenosis   . Anemia of chronic disease 12/25/2011  . Iron deficiency anemia 05/27/2011  . Diastolic CHF, chronic ([place]) 04/02/2010  . Type 2 diabetes mellitus with insulin therapy ([place]) 10/08/2009  . DYSLIPIDEMIA 10/08/2009  . Essential hypertension 10/08/2009  . Coronary atherosclerosis 10/08/2009  . CAROTID STENOSIS 10/08/2009   Outpatient Encounter Prescriptions as of 07/24/2016  Medication Sig  . amLODipine (NORVASC) 10 MG tablet Take 1 tablet (10 mg total) by mouth daily.  . Artificial Tear Ointment (DRY EYES OP) Apply 1 drop to eye daily as needed. For dry eye/ red eye   . atorvastatin (LIPITOR) 20 MG tablet TAKE (1) TABLET DAILY AT BEDTIME.  . celecoxib (CELEBREX) 200 MG capsule Take 1 capsule (200 mg total) by mouth daily. With food  . clopidogrel (PLAVIX) 75 MG tablet TAKE (1) TABLET BY MOUTH ONCE DAILY.  Marland Kitchen  docusate sodium (COLACE) 100 MG capsule Take 1 capsule (100 mg total) by mouth 2 (two) times daily.  Marland Kitchen EASY TOUCH INSULIN SYRINGE 31G X 5/16" 1 ML MISC USE AS DIRECTED.  Marland Kitchen EASY TOUCH PEN NEEDLES 31G X 8 MM MISC AS DIRECTED  . esomeprazole (NEXIUM) 40 MG capsule TAKE 1 CAPSULE BY MOUTH ONCE A DAY.  . ferrous sulfate 325 (65 FE) MG tablet Take 1 tablet (325 mg total) by mouth 3 (three) times daily after meals.  . fluocinolone (VANOS) 0.01 % cream Apply topically 2 (two) times daily.  . Fluocinonide 0.1 % CREA   . furosemide (LASIX) 20 MG tablet Take 1 tablet (20 mg total) by mouth daily as needed for fluid or edema.  Marland Kitchen HUMALOG KWIKPEN 100 UNIT/ML KiwkPen USE PER SLIDING SCALE NOTIFY MD OF RESULTS UNDER 60 OR OVER 300.  Marland Kitchen HUMULIN N 100 UNIT/ML injection Inject 28 Units into the skin every morning.  Marland Kitchen HYDROcodone-acetaminophen (NORCO) 7.5-325 MG per tablet Take 1-2 tablets by mouth every 4 (four) hours as needed for moderate pain.  . Insulin Detemir (LEVEMIR) 100 UNIT/ML Pen Inject 28 Units into the skin every morning.  . Insulin Pen Needle 31G X 5 MM MISC Use with  Levemir injection  . isosorbide mononitrate (IMDUR) 30 MG 24 hr tablet TAKE 1 TABLET ONCE DAILY.  Marland Kitchen linaclotide (LINZESS) 145 MCG CAPS capsule Take 1 capsule (145 mcg total) by mouth daily. To regulate bowel movements  . lisinopril (PRINIVIL,ZESTRIL) 20 MG tablet TAKE (1) TABLET BY MOUTH ONCE DAILY.  Marland Kitchen  LORazepam (ATIVAN) 0.5 MG tablet TAKE (1) TABLET TWICE DAILY as needed for anxiety  . meclizine (ANTIVERT) 25 MG tablet TAKE 1 TABLET TWICE DAILY AS NEEDED FOR DIZZINESS. (Patient taking differently: TAKE 1 TABLET TWICE DAILY FOR DIZZINESS.)  . metFORMIN (GLUCOPHAGE-XR) 500 MG 24 hr tablet TAKE 1 TABLET WITH BREAKFAST AND 1 TABLET WITH SUPPER.  . metoprolol (LOPRESSOR) 50 MG tablet TAKE (1) TABLET TWICE DAILY.  . Multiple Vitamins-Iron (DAILY VITAMINS/IRON/BETA CAROT PO) Take 1 tablet by mouth at bedtime.  . mupirocin ointment (BACTROBAN)  2 % Place 1 application into the nose 2 (two) times daily.  . nitroGLYCERIN (NITROSTAT) 0.4 MG SL tablet PLACE ONE (1) TABLET UNDER TONGUE EVERY 5 MINUTES UP TO (3) DOSES AS NEEDED FOR CHEST PAIN.  Marland Kitchen NITROSTAT 0.4 MG SL tablet PLACE 1 TABLET UNDER THE TONGUE AS DIRECTED FOR CHEST PAIN  . PARoxetine (PAXIL) 20 MG tablet TAKE (1) TABLET BY MOUTH ONCE DAILY.  Marland Kitchen PARoxetine (PAXIL) 20 MG tablet TAKE 1 TABLET ONCE DAILY.  Marland Kitchen polyethylene glycol (MIRALAX / GLYCOLAX) packet Take 17 g by mouth 2 (two) times daily. (Patient taking differently: Take 17 g by mouth daily as needed for mild constipation. )  . traMADol (ULTRAM) 50 MG tablet Take one tablet by mouth every 6 hours as needed for pain. Monitor for increased confusion, sedation, allergic reaction (Patient taking differently: Take 50 mg by mouth every 6 (six) hours as needed for moderate pain. Take one tablet by mouth every 6 hours as needed for pain. Monitor for increased confusion, sedation, allergic reaction)   No facility-administered encounter medications on file as of 07/24/2016.       Review of Systems  Constitutional: Negative.   Respiratory: Negative.   Cardiovascular: Negative.   Musculoskeletal: Positive for back pain.       Objective:   Physical Exam  Constitutional: She appears well-developed and well-nourished.  Abdominal: Soft. There is tenderness.  Neurological: She is alert.  Psychiatric: She has a normal mood and affect.  As noted above her hearing and seeing things that aren't real suggest some underlying dementia with superimposed infection   BP (!) 152/64 (BP Location: Left Arm, Patient Position: Sitting, Cuff Size: Normal)   Pulse (!) 59   Temp 98.2 F (36.8 C) (Oral)   Ht 5\' 6"  (1.676 m)   Wt 168 lb (76.2 kg)   BMI 27.12 kg/m         Assessment & Plan:  1. Dysuria Urine has lots of white cells and red cells and positive nitrite. We will culture this but begin Cipro 250 mg twice a day pending result of  culture. Encourage lots of fluids. Also gave her some samples of Linzess for constipation to take every 3-4 days  Wardell Honour MD - Urinalysis, Complete

## 2016-07-24 NOTE — Addendum Note (Signed)
Addended by: Jamelle Haring on: 07/24/2016 03:17 PM   Modules accepted: Orders

## 2016-07-27 ENCOUNTER — Encounter: Payer: Self-pay | Admitting: Nurse Practitioner

## 2016-07-27 ENCOUNTER — Ambulatory Visit (INDEPENDENT_AMBULATORY_CARE_PROVIDER_SITE_OTHER): Payer: Medicare Other | Admitting: Nurse Practitioner

## 2016-07-27 VITALS — BP 150/78 | HR 77 | Temp 99.8°F | Ht 66.0 in | Wt 168.0 lb

## 2016-07-27 DIAGNOSIS — L02232 Carbuncle of back [any part, except buttock]: Secondary | ICD-10-CM | POA: Diagnosis not present

## 2016-07-27 LAB — URINE CULTURE

## 2016-07-27 MED ORDER — SULFAMETHOXAZOLE-TRIMETHOPRIM 800-160 MG PO TABS: 1.0000 | ORAL_TABLET | Freq: Two times a day (BID) | ORAL | 0 refills | Status: DC

## 2016-07-27 NOTE — Progress Notes (Signed)
   Subjective:    [name], female    DOB: 12-19-1930, 82 y.o.   MRN: [id number]  HPI Patient in c/o pf boil on back- sore to touch- developed on Saturday- no drainage as of yet.  * patient on cipro for UTI for 3 days  Review of Systems  Constitutional: Negative.   Respiratory: Negative.   Cardiovascular: Negative.   Gastrointestinal: Negative.   Genitourinary: Negative.   Neurological: Negative.   Psychiatric/Behavioral: Negative.   All other systems reviewed and are negative.      Objective:   Physical Exam  Constitutional: She is oriented to person, place, and time. She appears well-developed and well-nourished. No distress.  Cardiovascular: Normal rate, regular rhythm and normal heart sounds.   Pulmonary/Chest: Effort normal and breath sounds normal.  Neurological: She is alert and oriented to person, place, and time.  Skin: Skin is warm.  3cm erythematous tender lesion right lower back    BP (!) 150/78 (BP Location: Right Arm, Cuff Size: Normal)   Pulse 77   Temp 99.8 F (37.7 C)   Ht 5\' 6"  (1.676 m)   Wt 168 lb (76.2 kg)   BMI 27.12 kg/m   Procedure  Lidocaine 1% with epi- 41ml local  Betadine prep  #11 blade to incise  scant amount of yellow excudte exsanguinated out  Dressing applied      Assessment & Plan:   1. Carbuncle of back    Stop cipro Meds ordered this encounter  Medications  . sulfamethoxazole-trimethoprim (BACTRIM DS) 800-160 MG tablet    Sig: Take 1 tablet by mouth 2 (two) times daily.    Dispense:  14 tablet    Refill:  0    Order Specific Question:   Supervising Provider    Answer:   Eustaquio Maize [[id number]]   Change dressing daily RTO prn  Mary-Margaret Hassell Done, FNP

## 2016-07-27 NOTE — Patient Instructions (Signed)

## 2016-07-31 ENCOUNTER — Encounter: Payer: Self-pay | Admitting: Nurse Practitioner

## 2016-07-31 ENCOUNTER — Ambulatory Visit (INDEPENDENT_AMBULATORY_CARE_PROVIDER_SITE_OTHER): Payer: Medicare Other | Admitting: Nurse Practitioner

## 2016-07-31 VITALS — BP 131/70 | HR 78 | Ht 66.0 in

## 2016-07-31 DIAGNOSIS — R42 Dizziness and giddiness: Secondary | ICD-10-CM

## 2016-07-31 LAB — GLUCOSE HEMOCUE WAIVED: GLU HEMOCUE WAIVED: 166 mg/dL — AB (ref 65–99)

## 2016-07-31 NOTE — Progress Notes (Signed)
   Subjective:    [name], female    DOB: 13-Apr-1931, 82 y.o.   MRN: [id number]  HPI Patient brought in by daughter stating that she is dizzy this morning- her blood sugar was 161. She was given some crackers and a drink and she is feeling better. Started yesterday.    Review of Systems  Constitutional: Negative.   HENT: Negative.   Respiratory: Negative.   Cardiovascular: Negative.   Gastrointestinal: Negative.   Genitourinary: Negative.   Neurological: Positive for dizziness.  Psychiatric/Behavioral: Negative.   All other systems reviewed and are negative.      Objective:   Physical Exam  Constitutional: She is oriented to person, place, and time. She appears well-developed and well-nourished.  Cardiovascular: Normal rate, regular rhythm and normal heart sounds.   Pulmonary/Chest: Effort normal and breath sounds normal.  Abdominal: Soft. Bowel sounds are normal.  Neurological: She is alert and oriented to person, place, and time. She has normal reflexes. No cranial nerve deficit.  Skin: Skin is warm.  Psychiatric: She has a normal mood and affect. Her behavior is normal. Judgment and thought content normal.   BP 131/70   Pulse 78   Ht 5\' 6"  (1.676 m)        Assessment & Plan:   1. Dizzy    antivert as previously rx Force fluids REST Rto prn  Mary-Margaret Hassell Done, FNP

## 2016-07-31 NOTE — Patient Instructions (Signed)
Fall Prevention in the Home  Falls can cause injuries and can affect people from all age groups. There are many simple things that you can do to make your home safe and to help prevent falls. WHAT CAN I DO ON THE OUTSIDE OF MY HOME?  Regularly repair the edges of walkways and driveways and fix any cracks.  Remove high doorway thresholds.  Trim any shrubbery on the main path into your home.  Use bright outdoor lighting.  Clear walkways of debris and clutter, including tools and rocks.  Regularly check that handrails are securely fastened and in good repair. Both sides of any steps should have handrails.  Install guardrails along the edges of any raised decks or porches.  Have leaves, snow, and ice cleared regularly.  Use sand or salt on walkways during winter months.  In the garage, clean up any spills right away, including grease or oil spills. WHAT CAN I DO IN THE BATHROOM?  Use night lights.  Install grab bars by the toilet and in the tub and shower. Do not use towel bars as grab bars.  Use non-skid mats or decals on the floor of the tub or shower.  If you need to sit down while you are in the shower, use a plastic, non-slip stool..  Keep the floor dry. Immediately clean up any water that spills on the floor.  Remove soap buildup in the tub or shower on a regular basis.  Attach bath mats securely with double-sided non-slip rug tape.  Remove throw rugs and other tripping hazards from the floor. WHAT CAN I DO IN THE BEDROOM?  Use night lights.  Make sure that a bedside light is easy to reach.  Do not use oversized bedding that drapes onto the floor.  Have a firm chair that has side arms to use for getting dressed.  Remove throw rugs and other tripping hazards from the floor. WHAT CAN I DO IN THE KITCHEN?   Clean up any spills right away.  Avoid walking on wet floors.  Place frequently used items in easy-to-reach places.  If you need to reach for something  above you, use a sturdy step stool that has a grab bar.  Keep electrical cables out of the way.  Do not use floor polish or wax that makes floors slippery. If you have to use wax, make sure that it is non-skid floor wax.  Remove throw rugs and other tripping hazards from the floor. WHAT CAN I DO IN THE STAIRWAYS?  Do not leave any items on the stairs.  Make sure that there are handrails on both sides of the stairs. Fix handrails that are broken or loose. Make sure that handrails are as long as the stairways.  Check any carpeting to make sure that it is firmly attached to the stairs. Fix any carpet that is loose or worn.  Avoid having throw rugs at the top or bottom of stairways, or secure the rugs with carpet tape to prevent them from moving.  Make sure that you have a light switch at the top of the stairs and the bottom of the stairs. If you do not have them, have them installed. WHAT ARE SOME OTHER FALL PREVENTION TIPS?  Wear closed-toe shoes that fit well and support your feet. Wear shoes that have rubber soles or low heels.  When you use a stepladder, make sure that it is completely opened and that the sides are firmly locked. Have someone hold the ladder while you   are using it. Do not climb a closed stepladder.  Add color or contrast paint or tape to grab bars and handrails in your home. Place contrasting color strips on the first and last steps.  Use mobility aids as needed, such as canes, walkers, scooters, and crutches.  Turn on lights if it is dark. Replace any light bulbs that burn out.  Set up furniture so that there are clear paths. Keep the furniture in the same spot.  Fix any uneven floor surfaces.  Choose a carpet design that does not hide the edge of steps of a stairway.  Be aware of any and all pets.  Review your medicines with your healthcare provider. Some medicines can cause dizziness or changes in blood pressure, which increase your risk of falling. Talk  with your health care provider about other ways that you can decrease your risk of falls. This may include working with a physical therapist or trainer to improve your strength, balance, and endurance.   This information is not intended to replace advice given to you by your health care provider. Make sure you discuss any questions you have with your health care provider.   Document Released: 11/13/2002 Document Revised: 04/09/2015 Document Reviewed: 12/28/2014 Elsevier Interactive Patient Education 2016 [employer] Inc[employer]  

## 2016-08-03 ENCOUNTER — Other Ambulatory Visit: Payer: Self-pay | Admitting: Family

## 2016-08-14 ENCOUNTER — Other Ambulatory Visit: Payer: Self-pay | Admitting: Family Medicine

## 2016-08-17 ENCOUNTER — Encounter: Payer: Self-pay | Admitting: Pediatrics

## 2016-08-17 ENCOUNTER — Ambulatory Visit (INDEPENDENT_AMBULATORY_CARE_PROVIDER_SITE_OTHER): Payer: Medicare Other | Admitting: Pediatrics

## 2016-08-17 VITALS — BP 143/75 | HR 77 | Temp 97.1°F | Ht 66.0 in | Wt 167.6 lb

## 2016-08-17 DIAGNOSIS — Z794 Long term (current) use of insulin: Secondary | ICD-10-CM | POA: Diagnosis not present

## 2016-08-17 DIAGNOSIS — E119 Type 2 diabetes mellitus without complications: Secondary | ICD-10-CM

## 2016-08-17 DIAGNOSIS — I10 Essential (primary) hypertension: Secondary | ICD-10-CM | POA: Diagnosis not present

## 2016-08-17 DIAGNOSIS — E663 Overweight: Secondary | ICD-10-CM | POA: Diagnosis not present

## 2016-08-17 DIAGNOSIS — M5442 Lumbago with sciatica, left side: Secondary | ICD-10-CM

## 2016-08-17 DIAGNOSIS — I25709 Atherosclerosis of coronary artery bypass graft(s), unspecified, with unspecified angina pectoris: Secondary | ICD-10-CM | POA: Diagnosis not present

## 2016-08-17 DIAGNOSIS — K219 Gastro-esophageal reflux disease without esophagitis: Secondary | ICD-10-CM

## 2016-08-17 MED ORDER — ISOSORBIDE MONONITRATE ER 30 MG PO TB24: 30.0000 mg | ORAL_TABLET | Freq: Every day | ORAL | 0 refills | Status: DC

## 2016-08-17 NOTE — Progress Notes (Signed)
  Subjective:   [name], female    DOB: 1931-09-06, 82 y.o.   MRN: [id number] CC: Back Pain  HPI: [name] is a 82 y.o. female presenting for Back Pain  Has been present for a couple of weeks Started getting worse 3 days ago Present L side of lower back Pain goes down into L buttock Has been using walker regularly Bends over walker at an angle with walking Did have a fall 3 weeks ago, mechanical Denies lightheadedness Has taken aleve and celebrex for pain with some improvement Does take ativan for anxiety regularly  No HA, no CP Has appt next week for DM2 follow up, due for A1c then Pt denies any low BGLs at home recently  Relevant past medical, surgical, family and social history reviewed. Allergies and medications reviewed and updated. History  Smoking Status  . Never Smoker  Smokeless Tobacco  . Never Used   ROS: Per HPI   Objective:    BP (!) 143/75   Pulse 77   Temp 97.1 F (36.2 C) (Oral)   Ht 5\' 6"  (1.676 m)   Wt 167 lb 9.6 oz (76 kg)   BMI 27.05 kg/m   Wt Readings from Last 3 Encounters:  08/17/16 167 lb 9.6 oz (76 kg)  07/27/16 168 lb (76.2 kg)  07/24/16 168 lb (76.2 kg)    Gen: NAD, alert, cooperative with exam, NCAT EYES: EOMI, no conjunctival injection, or no icterus CV: NRRR, normal Q0/G8, III/VI systolic murmur Resp: CTABL, no wheezes, normal WOB Ext: No edema, warm Neuro: Alert and oriented, sensation intact b/l LE MSK: TTP L lumbar paraspinal muscles. No point tenderness over spine  Assessment & Plan:  [name] was seen today for back pain.  Diagnoses and all orders for this visit:  Left-sided low back pain with left-sided sciatica Ongoing past three days Try heating pad Avoid use of both celebrex and aleve in a day If not improvement over next couple of weeks will refer for PT Pt with recent mechanical fall Discussed sending for PT now for gait traning Pt declines but is open to it in future if back pain not  improving  Type 2 diabetes mellitus with insulin therapy ([place]) Takes insulin regularly Most recent BGLs that she can remember in 100s Doesn't check every day Has f/u DM2 appt with A1c check in a couple of weeks  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  Essential hypertension Elevated today, asymptomatic Took medicines right before office visit Review of recent BPs have been better controlled Given recent falls, hesitant to change medicines without more information re BPs Pt also has not been taking imdur Restart imdur Check at home, bring numbers to next clinic visit Try to take meds daily same time in morning  Gastroesophageal reflux disease, esophagitis presence not specified Well controlled on nexium  Overweight (BMI 25.0-29.9) Cont avoiding sugary foods, eat lots of fruit/veg  Follow up plan: As scheduled, 2 weeks Assunta Found, MD [place]

## 2016-08-17 NOTE — Patient Instructions (Addendum)
Continue to stay active If an activity hurts your back, stop Use lidocaine cream and patches as needed  Do not take both celebrex and aleve in the same day OK to take tylenol for pain Try heating pads

## 2016-08-25 DIAGNOSIS — B351 Tinea unguium: Secondary | ICD-10-CM | POA: Diagnosis not present

## 2016-08-25 DIAGNOSIS — E1142 Type 2 diabetes mellitus with diabetic polyneuropathy: Secondary | ICD-10-CM | POA: Diagnosis not present

## 2016-08-25 DIAGNOSIS — L84 Corns and callosities: Secondary | ICD-10-CM | POA: Diagnosis not present

## 2016-09-04 ENCOUNTER — Other Ambulatory Visit: Payer: Self-pay | Admitting: Family Medicine

## 2016-09-04 ENCOUNTER — Encounter: Payer: Self-pay | Admitting: Family Medicine

## 2016-09-04 ENCOUNTER — Other Ambulatory Visit: Payer: Self-pay | Admitting: Pediatrics

## 2016-09-04 ENCOUNTER — Ambulatory Visit (INDEPENDENT_AMBULATORY_CARE_PROVIDER_SITE_OTHER): Payer: Medicare Other | Admitting: Family Medicine

## 2016-09-04 ENCOUNTER — Other Ambulatory Visit: Payer: Self-pay | Admitting: Family

## 2016-09-04 VITALS — BP 173/71 | HR 59 | Temp 98.8°F | Ht 66.0 in | Wt 161.0 lb

## 2016-09-04 DIAGNOSIS — E785 Hyperlipidemia, unspecified: Secondary | ICD-10-CM | POA: Diagnosis not present

## 2016-09-04 DIAGNOSIS — E119 Type 2 diabetes mellitus without complications: Secondary | ICD-10-CM

## 2016-09-04 DIAGNOSIS — Z794 Long term (current) use of insulin: Secondary | ICD-10-CM | POA: Diagnosis not present

## 2016-09-04 DIAGNOSIS — D509 Iron deficiency anemia, unspecified: Secondary | ICD-10-CM

## 2016-09-04 DIAGNOSIS — R2681 Unsteadiness on feet: Secondary | ICD-10-CM | POA: Diagnosis not present

## 2016-09-04 DIAGNOSIS — I10 Essential (primary) hypertension: Secondary | ICD-10-CM | POA: Diagnosis not present

## 2016-09-04 DIAGNOSIS — R109 Unspecified abdominal pain: Secondary | ICD-10-CM

## 2016-09-04 DIAGNOSIS — R531 Weakness: Secondary | ICD-10-CM

## 2016-09-04 DIAGNOSIS — I25709 Atherosclerosis of coronary artery bypass graft(s), unspecified, with unspecified angina pectoris: Secondary | ICD-10-CM | POA: Diagnosis not present

## 2016-09-04 DIAGNOSIS — K219 Gastro-esophageal reflux disease without esophagitis: Secondary | ICD-10-CM

## 2016-09-04 DIAGNOSIS — M25562 Pain in left knee: Secondary | ICD-10-CM

## 2016-09-04 LAB — URINALYSIS, COMPLETE
Bilirubin, UA: NEGATIVE
Glucose, UA: NEGATIVE
Nitrite, UA: POSITIVE — AB
SPEC GRAV UA: 1.02 (ref 1.005–1.030)
Urobilinogen, Ur: 0.2 mg/dL (ref 0.2–1.0)
pH, UA: 5.5 (ref 5.0–7.5)

## 2016-09-04 LAB — MICROSCOPIC EXAMINATION: WBC, UA: >30 /hpf — AB (ref 0–?)

## 2016-09-04 LAB — BAYER DCA HB A1C WAIVED: HB A1C: 8.2 % — AB (ref <7.0)

## 2016-09-04 MED ORDER — METFORMIN HCL ER 500 MG PO TB24: ORAL_TABLET | ORAL | 5 refills | Status: DC

## 2016-09-04 MED ORDER — LINACLOTIDE 145 MCG PO CAPS: 145.0000 ug | ORAL_CAPSULE | Freq: Every day | ORAL | 5 refills | Status: DC

## 2016-09-04 MED ORDER — ISOSORBIDE MONONITRATE ER 30 MG PO TB24: 30.0000 mg | ORAL_TABLET | Freq: Every day | ORAL | 0 refills | Status: DC

## 2016-09-04 MED ORDER — METOPROLOL TARTRATE 50 MG PO TABS: ORAL_TABLET | ORAL | 6 refills | Status: DC

## 2016-09-04 MED ORDER — TRAMADOL HCL 50 MG PO TABS: ORAL_TABLET | ORAL | 0 refills | Status: DC

## 2016-09-04 MED ORDER — PAROXETINE HCL 20 MG PO TABS: ORAL_TABLET | ORAL | 5 refills | Status: DC

## 2016-09-04 MED ORDER — AMLODIPINE BESYLATE 10 MG PO TABS: 10.0000 mg | ORAL_TABLET | Freq: Every day | ORAL | 6 refills | Status: DC

## 2016-09-04 MED ORDER — LISINOPRIL 20 MG PO TABS: ORAL_TABLET | ORAL | 5 refills | Status: DC

## 2016-09-04 MED ORDER — FUROSEMIDE 20 MG PO TABS: 20.0000 mg | ORAL_TABLET | Freq: Every day | ORAL | 5 refills | Status: DC | PRN

## 2016-09-04 NOTE — Progress Notes (Signed)
Subjective:    Patient ID: [name], female    DOB: Jul 28, 1931, 81 y.o.   MRN: [id number]  HPI Pt here for follow up and management of chronic medical problems which includes diabetes, hypertension and hyperlipidemia. She is taking medications regularly. She continues to have some left-sided low back hip pain with some radiation to her leg. She was seen for this several weeks ago. She has been taking Celebrex and Aleve I would prefer she not take anti-inflammatories given her diabetes and borderline renal function. Her weight is down some. Sounds like she may have some early anorexia.     Patient Active Problem List   Diagnosis Date Noted  . Overweight (BMI 25.0-29.9) 05/08/2016  . UTI (lower urinary tract infection) 11/12/2015  . DM hyperosmolarity type II, uncontrolled ([place]) 11/12/2015  . Dementia   . Encephalopathy, metabolic 09/98/3382  . S/P right TKA 08/13/2015  . S/P knee replacement 08/13/2015  . Elevated troponin 03/06/2015  . Fever   . History of diabetes mellitus   . Left buttock pain   . Hyperlipidemia 01/11/2015  . GAD (generalized anxiety disorder) 01/11/2015  . Depression 01/11/2015  . GERD (gastroesophageal reflux disease) 01/11/2015  . Osteopenia 07/13/2014  . Obesity (BMI 30-39.9) 06/19/2014  . Carotid stenosis   . Anemia of chronic disease 12/25/2011  . Iron deficiency anemia 05/27/2011  . Diastolic CHF, chronic ([place]) 04/02/2010  . Type 2 diabetes mellitus with insulin therapy ([place]) 10/08/2009  . DYSLIPIDEMIA 10/08/2009  . Essential hypertension 10/08/2009  . Coronary atherosclerosis 10/08/2009  . CAROTID STENOSIS 10/08/2009   Outpatient Encounter Prescriptions as of 09/04/2016  Medication Sig  . amLODipine (NORVASC) 10 MG tablet Take 1 tablet (10 mg total) by mouth daily.  . Artificial Tear Ointment (DRY EYES OP) Apply 1 drop to eye daily as needed. For dry eye/ red eye   . atorvastatin (LIPITOR) 20 MG tablet TAKE (1) TABLET DAILY AT BEDTIME.    . celecoxib (CELEBREX) 200 MG capsule Take 1 capsule (200 mg total) by mouth daily. With food  . clopidogrel (PLAVIX) 75 MG tablet TAKE 1 TABLET ONCE DAILY.  Marland Kitchen docusate sodium (COLACE) 100 MG capsule Take 1 capsule (100 mg total) by mouth 2 (two) times daily.  Marland Kitchen EASY TOUCH INSULIN SYRINGE 31G X 5/16" 1 ML MISC USE AS DIRECTED.  Marland Kitchen EASY TOUCH PEN NEEDLES 31G X 8 MM MISC AS DIRECTED  . esomeprazole (NEXIUM) 40 MG capsule TAKE 1 CAPSULE BY MOUTH ONCE A DAY.  . ferrous sulfate 325 (65 FE) MG tablet Take 1 tablet (325 mg total) by mouth 3 (three) times daily after meals.  . fluocinolone (VANOS) 0.01 % cream Apply topically 2 (two) times daily.  . Fluocinonide 0.1 % CREA   . furosemide (LASIX) 20 MG tablet Take 1 tablet (20 mg total) by mouth daily as needed for fluid or edema.  Marland Kitchen HUMALOG KWIKPEN 100 UNIT/ML KiwkPen USE PER SLIDING SCALE NOTIFY MD OF RESULTS UNDER 60 OR OVER 300.  Marland Kitchen HUMULIN N 100 UNIT/ML injection Inject 28 Units into the skin every morning.  . Insulin Detemir (LEVEMIR) 100 UNIT/ML Pen Inject 28 Units into the skin every morning.  . Insulin Pen Needle 31G X 5 MM MISC Use with  Levemir injection  . isosorbide mononitrate (IMDUR) 30 MG 24 hr tablet Take 1 tablet (30 mg total) by mouth daily.  Marland Kitchen linaclotide (LINZESS) 145 MCG CAPS capsule Take 1 capsule (145 mcg total) by mouth daily. To regulate bowel movements  .  lisinopril (PRINIVIL,ZESTRIL) 20 MG tablet TAKE (1) TABLET BY MOUTH ONCE DAILY.  Marland Kitchen LORazepam (ATIVAN) 0.5 MG tablet TAKE (1) TABLET TWICE DAILY as needed for anxiety  . meclizine (ANTIVERT) 25 MG tablet TAKE 1 TABLET TWICE DAILY AS NEEDED FOR DIZZINESS. (Patient taking differently: TAKE 1 TABLET TWICE DAILY FOR DIZZINESS.)  . metFORMIN (GLUCOPHAGE-XR) 500 MG 24 hr tablet TAKE 1 TABLET WITH BREAKFAST AND 1 TABLET WITH SUPPER.  . metoprolol (LOPRESSOR) 50 MG tablet TAKE (1) TABLET TWICE DAILY.  . Multiple Vitamins-Iron (DAILY VITAMINS/IRON/BETA CAROT PO) Take 1 tablet by mouth  at bedtime.  . mupirocin ointment (BACTROBAN) 2 % Place 1 application into the nose 2 (two) times daily.  . nitroGLYCERIN (NITROSTAT) 0.4 MG SL tablet PLACE ONE (1) TABLET UNDER TONGUE EVERY 5 MINUTES UP TO (3) DOSES AS NEEDED FOR CHEST PAIN.  Marland Kitchen PARoxetine (PAXIL) 20 MG tablet TAKE (1) TABLET BY MOUTH ONCE DAILY.  Marland Kitchen polyethylene glycol (MIRALAX / GLYCOLAX) packet Take 17 g by mouth 2 (two) times daily. (Patient taking differently: Take 17 g by mouth daily as needed for mild constipation. )   No facility-administered encounter medications on file as of 09/04/2016.       Review of Systems  Constitutional: Negative.   HENT: Negative.   Eyes: Negative.   Respiratory: Negative.   Cardiovascular: Negative.   Gastrointestinal: Negative.   Endocrine: Negative.   Genitourinary: Positive for flank pain (left side ).  Musculoskeletal: Positive for arthralgias (left hip pain).  Skin: Negative.   Allergic/Immunologic: Negative.   Neurological: Negative.   Hematological: Negative.   Psychiatric/Behavioral: Negative.        Objective:   Physical Exam  Constitutional: She appears well-developed and well-nourished.  Cardiovascular: Normal rate and regular rhythm.   Pulmonary/Chest: Effort normal and breath sounds normal.  Musculoskeletal: Tenderness: tenderness with palpation of left lower back and hip straight leg raising is negative. Reflexes are symmetric.  Psychiatric: She has a normal mood and affect.   BP (!) 173/71 (BP Location: Right Arm)   Pulse (!) 59   Temp 98.8 F (37.1 C) (Oral)   Ht 5' 6"  (1.676 m)   Wt 161 lb (73 kg)   BMI 25.99 kg/m         Assessment & Plan:  1. Type 2 diabetes mellitus with insulin therapy (HCC) Last A1c was 9.1. She continues on the regimen of metformin. - Bayer DCA Hb A1c Waived - DME Other see comment  2. 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  Dispense: 30 tablet; Refill: 0  3. Essential hypertension Blood pressure is elevated today at 173/71 but it was at 143/75 last visit. I'm not inclined to change things around on the basis of 1 reading - CMP14+EGFR  4. Gastroesophageal reflux disease, esophagitis presence not specified Continue with Nexium  5. Hyperlipidemia Lipids are at goal on atorvastatin  6. Iron deficiency anemia   7. Left flank pain Urine is pending at time of dictation I think her pain isn't likely some arthritis and degenerative disc disease. Will provide tramadol to take as needed but also Tylenol rather than NSAIDs - Urinalysis, Complete - Urine culture  8. Unsteady gait She is status post right total knee replacement - Ambulatory referral  to Physical Therapy  9. Weakness She is less active than before and that contributes to her weakness. We'll see if PT can help in terms of her back pain and gait. She has had a fall in the past - Ambulatory referral to Physical Therapy  10. Left knee pain Right knee has been replaced. Likely has some arthritis similarly in the left knee - Ambulatory referral to Physical Therapy  11. Essential hypertension, benign - furosemide (LASIX) 20 MG tablet; Take 1 tablet (20 mg total) by mouth daily as needed for fluid or edema.  Dispense: 30 tablet; Refill: 5 Wardell Honour MD

## 2016-09-04 NOTE — Patient Instructions (Signed)
Medicare Annual Wellness Visit  [place] and the medical providers at [facility] strive to bring you the best medical care.  In doing so we not only want to address your current medical conditions and concerns but also to detect new conditions early and prevent illness, disease and health-related problems.    Medicare offers a yearly Wellness Visit which allows our clinical staff to assess your need for preventative services including immunizations, lifestyle education, counseling to decrease risk of preventable diseases and screening for fall risk and other medical concerns.    This visit is provided free of charge (no copay) for all Medicare recipients. The clinical pharmacists at [facility] have begun to conduct these Wellness Visits which will also include a thorough review of all your medications.    As you primary medical provider recommend that you make an appointment for your Annual Wellness Visit if you have not done so already this year.  You may set up this appointment before you leave today or you may call back ([phone]) and schedule an appointment.  Please make sure when you call that you mention that you are scheduling your Annual Wellness Visit with the clinical pharmacist so that the appointment may be made for the proper length of time.     Continue current medications. Continue good therapeutic lifestyle changes which include good diet and exercise. Fall precautions discussed with patient. If an FOBT was given today- please return it to our front desk. If you are over 50 years old - you may need Prevnar 13 or the adult Pneumonia vaccine.  **Flu shots are available--- please call and schedule a FLU-CLINIC appointment**  After your visit with us today you will receive a survey in the mail or online from [employer] regarding your care with us. Please take a moment to fill this out. Your feedback is very  important to us as you can help us better understand your patient needs as well as improve your experience and satisfaction. WE CARE ABOUT YOU!!!    

## 2016-09-05 LAB — CMP14+EGFR
A/G RATIO: 1.4 (ref 1.2–2.2)
ALT: 8 IU/L (ref 0–32)
AST: 11 IU/L (ref 0–40)
Albumin: 4.1 g/dL (ref 3.5–4.7)
Alkaline Phosphatase: 45 IU/L (ref 39–117)
BILIRUBIN TOTAL: 0.3 mg/dL (ref 0.0–1.2)
BUN/Creatinine Ratio: 20 (ref 12–28)
BUN: 26 mg/dL (ref 8–27)
CALCIUM: 10 mg/dL (ref 8.7–10.3)
CHLORIDE: 98 mmol/L (ref 96–106)
CO2: 25 mmol/L (ref 18–29)
Creatinine, Ser: 1.3 mg/dL — ABNORMAL HIGH (ref 0.57–1.00)
GFR calc Af Amer: 44 mL/min/{1.73_m2} — ABNORMAL LOW (ref >59)
GFR calc non Af Amer: 38 mL/min/{1.73_m2} — ABNORMAL LOW (ref >59)
GLUCOSE: 202 mg/dL — AB (ref 65–99)
Globulin, Total: 2.9 g/dL (ref 1.5–4.5)
POTASSIUM: 4.7 mmol/L (ref 3.5–5.2)
Sodium: 139 mmol/L (ref 134–144)
Total Protein: 7 g/dL (ref 6.0–8.5)

## 2016-09-07 LAB — URINE CULTURE

## 2016-09-08 ENCOUNTER — Other Ambulatory Visit: Payer: Self-pay | Admitting: *Deleted

## 2016-09-08 MED ORDER — NITROFURANTOIN MONOHYD MACRO 100 MG PO CAPS: 100.0000 mg | ORAL_CAPSULE | Freq: Two times a day (BID) | ORAL | 0 refills | Status: DC

## 2016-09-08 NOTE — Progress Notes (Signed)
Macrobid sent to pharmacy per message on lab results

## 2016-09-09 ENCOUNTER — Ambulatory Visit: Payer: Medicare Other | Attending: Family Medicine | Admitting: Physical Therapy

## 2016-09-09 DIAGNOSIS — R260 Ataxic gait: Secondary | ICD-10-CM | POA: Insufficient documentation

## 2016-09-09 DIAGNOSIS — M6281 Muscle weakness (generalized): Secondary | ICD-10-CM | POA: Insufficient documentation

## 2016-09-09 DIAGNOSIS — R293 Abnormal posture: Secondary | ICD-10-CM | POA: Diagnosis not present

## 2016-09-09 DIAGNOSIS — R2689 Other abnormalities of gait and mobility: Secondary | ICD-10-CM

## 2016-09-09 NOTE — Therapy (Signed)
[facility] [place], [place], [address] Phone: [phone]   Fax:  [phone]  Physical Therapy Evaluation  Patient Details  Name: [name] MRN: [id number] Date of Birth: Oct 10, 1931 Referring Provider: Alain Honey MD.  Encounter Date: 09/09/2016      PT End of Session - 09/09/16 1236    Visit Number 1   Number of Visits 16   Date for PT Re-Evaluation 11/08/16   Authorization - Visit Number --   PT Start Time 5462   PT Stop Time 1249   PT Time Calculation (min) 39 min      Past Medical History:  Diagnosis Date  . Anemia   . Anxiety   . Arthritis    back, knees, and hips  . Asthma    with allergies  . Balance problems   . CAD (coronary artery disease)   . Carotid stenosis   . CHF (congestive heart failure) ([place])    EF preserved Echo 2012  . Chronic kidney infection   . 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 Dementia    "a little"  . Depression   . Diabetes mellitus    type 2  . Furuncle of back, except buttock   . GERD (gastroesophageal reflux disease)   . H/O seasonal allergies   . Hearing loss   . Hypertension   . Iron deficiency anemia 05/27/2011  . Myocardial infarction   . Pneumonia    hx of  . Renal insufficiency   . Shortness of breath dyspnea    with activity  . Speech problem    from stroke  . Swallowing difficulty   . Vertigo    "when sugar gets low"  . Vision loss    left eye-"half blind"    Past Surgical History:  Procedure Laterality Date  . APPENDECTOMY     with hysterectomy  . CATARACT EXTRACTION Bilateral 5 years ago  . CHOLECYSTECTOMY    . CORONARY ANGIOPLASTY     prior to 2006 5 stents  . CORONARY ARTERY BYPASS GRAFT  2006  . I & D of Furuncle  April 2013  . KNEE SURGERY Right   . TOTAL ABDOMINAL HYSTERECTOMY  ~82 years old   complete, with tumor removal  . TOTAL KNEE  ARTHROPLASTY Right 08/13/2015   Procedure: RIGHT  TOTAL KNEE ARTHROPLASTY;  Surgeon: Paralee Cancel, MD;  Location: [facility];  Service: Orthopedics;  Laterality: Right;    There were no vitals filed for this visit.       Subjective Assessment - 09/09/16 1239    Subjective The patient reports increased unsteadiness on feet over the last several months.  She reports on fall. Morever, she has been having quite intense left sided low back and left hip pain with muscle spams that impair her mobility.  She also states she has been feeling weak and feels like therapy will be helpful to improve her walking.     Patient is accompained by: Family member   Limitations Walking   Patient Stated Goals Walk better.   Pain Score 8    Pain Location Back   Pain Orientation Left   Pain Descriptors / Indicators Aching;Cramping;Spasm   Pain Type Acute pain   Pain Radiating Towards Left hip.   Pain Frequency Constant   Aggravating Factors  Walking and turning.   Pain Relieving Factors Pain medication.  [facility] PT Assessment - 09/09/16 0001      Assessment   Medical Diagnosis Unsteady gait.   Referring Provider Alain Honey MD.   Onset Date/Surgical Date --  Ongoing.     Precautions   Precautions Fall     Restrictions   Weight Bearing Restrictions No     Balance Screen   Has the patient fallen in the past 6 months Yes   How many times? 1   Has the patient had a decrease in activity level because of a fear of falling?  Yes   Is the patient reluctant to leave their home because of a fear of falling?  No     Prior Function   Level of Independence Requires assistive device for independence     Posture/Postural Control   Posture/Postural Control Postural limitations   Postural Limitations Rounded Shoulders;Forward head;Decreased lumbar lordosis;Flexed trunk     AROM   Overall AROM Comments WFL for bilateral LE's.  the patient stands in 20 degrees of lumbar flexion but can achieve the  upright neutral spine posture.     Strength   Overall Strength Comments Left hip abduction is 4-/5.     Palpation   Palpation comment Very tender to palpation over left lumbar muscul;ature and hip abductor musculature which goes into spasm.     Ambulation/Gait   Gait Pattern Decreased step length - right;Decreased step length - left;Decreased stance time - right;Decreased stance time - left;Trunk flexed;Poor foot clearance - left;Poor foot clearance - right     Standardized Balance Assessment   Standardized Balance Assessment Berg Balance Test     Berg Balance Test   Sit to Stand Able to stand  independently using hands   Standing Unsupported Able to stand 30 seconds unsupported   Sitting with Back Unsupported but Feet Supported on Floor or Stool Able to sit safely and securely 2 minutes   Stand to Sit Uses backs of legs against chair to control descent   Transfers Able to transfer safely, definite need of hands   Standing Unsupported with Eyes Closed Unable to keep eyes closed 3 seconds but stays steady   Standing Ubsupported with Feet Together Needs help to attain position and unable to hold for 15 seconds   From Standing, Reach Forward with Outstretched Arm Can reach forward >5 cm safely (2")   From Standing Position, Pick up Object from Floor Unable to pick up shoe, but reaches 2-5 cm (1-2") from shoe and balances independently   From Standing Position, Turn to Look Behind Over each Shoulder Looks behind one side only/other side shows less weight shift   Turn 360 Degrees Needs close supervision or verbal cueing   Standing Unsupported, Alternately Place Feet on Step/Stool Needs assistance to keep from falling or unable to try   Standing Unsupported, One Foot in Front Needs help to step but can hold 15 seconds   Standing on One Leg Unable to try or needs assist to prevent fall   Total Score 24                             PT Short Term Goals - 09/09/16 1246       PT SHORT TERM GOAL #1   Title Berg score to 30/56.   Time 4   Period Weeks   Status New           PT Long Term Goals - 09/09/16 1247  PT LONG TERM GOAL #1   Title Ind with an advanced HEP.   Time 8   Period Weeks   Status New     PT LONG TERM GOAL #2   Title Left hip strength to a solid 4+/5.   Time 8   Period Weeks   Status New     PT LONG TERM GOAL #3   Title Patient walk in clinic 500 feet with a FWW in an upright posture.   Time 8   Period Weeks   Status New               Plan - 09/09/16 1239    PT Next Visit Plan Gait training with a FWW.  The patient is currently using a three wheeled walker but may do better with a FWW.  LE strength exercises especially left hip abduction.  Gait and balance activites.      Patient will benefit from skilled therapeutic intervention in order to improve the following deficits and impairments:     Visit Diagnosis: Ataxic gait - Plan: PT plan of care cert/re-cert  Other abnormalities of gait and mobility - Plan: PT plan of care cert/re-cert  Muscle weakness (generalized) - Plan: PT plan of care cert/re-cert  Abnormal posture - Plan: PT plan of care cert/re-cert      G-Codes - 17/61/60 1258    Functional Assessment Tool Used Clinical judgement.   Functional Limitation Mobility: Walking and moving around   Mobility: Walking and Moving Around Current Status [phone]) At least 60 percent but less than 80 percent impaired, limited or restricted   Mobility: Walking and Moving Around Goal Status [phone]) At least 20 percent but less than 40 percent impaired, limited or restricted       Problem List Patient Active Problem List   Diagnosis Date Noted  . Overweight (BMI 25.0-29.9) 05/08/2016  . UTI (lower urinary tract infection) 11/12/2015  . DM hyperosmolarity type II, uncontrolled ([place]) 11/12/2015  . Dementia   . Encephalopathy, metabolic 85/46/2703  . S/P right TKA 08/13/2015  . S/P knee replacement  08/13/2015  . Elevated troponin 03/06/2015  . Fever   . History of diabetes mellitus   . Left buttock pain   . Hyperlipidemia 01/11/2015  . GAD (generalized anxiety disorder) 01/11/2015  . Depression 01/11/2015  . GERD (gastroesophageal reflux disease) 01/11/2015  . Osteopenia 07/13/2014  . Obesity (BMI 30-39.9) 06/19/2014  . Carotid stenosis   . Anemia of chronic disease 12/25/2011  . Iron deficiency anemia 05/27/2011  . Diastolic CHF, chronic ([place]) 04/02/2010  . Type 2 diabetes mellitus with insulin therapy ([place]) 10/08/2009  . DYSLIPIDEMIA 10/08/2009  . Essential hypertension 10/08/2009  . Coronary atherosclerosis 10/08/2009  . CAROTID STENOSIS 10/08/2009    APPLEGATE, [place] MPT 09/09/2016, 1:18 PM  [facility] [place], [place], [address] Phone: [phone]   Fax:  [phone]  Name: [name] MRN: [id number] Date of Birth: September 01, 1931

## 2016-09-15 ENCOUNTER — Encounter: Payer: Self-pay | Admitting: Physical Therapy

## 2016-09-15 ENCOUNTER — Ambulatory Visit: Payer: Medicare Other | Admitting: Physical Therapy

## 2016-09-15 DIAGNOSIS — R260 Ataxic gait: Secondary | ICD-10-CM

## 2016-09-15 DIAGNOSIS — R2689 Other abnormalities of gait and mobility: Secondary | ICD-10-CM | POA: Diagnosis not present

## 2016-09-15 DIAGNOSIS — M6281 Muscle weakness (generalized): Secondary | ICD-10-CM

## 2016-09-15 DIAGNOSIS — R293 Abnormal posture: Secondary | ICD-10-CM

## 2016-09-15 NOTE — Therapy (Signed)
[facility] [place], [place], [address] Phone: [phone]   Fax:  [phone]  Physical Therapy Treatment  Patient Details  Name: [name] MRN: [id number] Date of Birth: [birth year] Referring Provider: Alain Honey MD.  Encounter Date: 09/15/2016      PT End of Session - 09/15/16 1116    Visit Number 2   Number of Visits 16   Date for PT Re-Evaluation 11/08/16   PT Start Time 1119   PT Stop Time 1200   PT Time Calculation (min) 41 min   Activity Tolerance Patient tolerated treatment well   Behavior During Therapy [facility] for tasks assessed/performed      Past Medical History:  Diagnosis Date  . Anemia   . Anxiety   . Arthritis    back, knees, and hips  . Asthma    with allergies  . Balance problems   . CAD (coronary artery disease)   . Carotid stenosis   . CHF (congestive heart failure) ([place])    EF preserved Echo 2012  . Chronic kidney infection   . 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 Dementia    "a little"  . Depression   . Diabetes mellitus    type 2  . Furuncle of back, except buttock   . GERD (gastroesophageal reflux disease)   . H/O seasonal allergies   . Hearing loss   . Hypertension   . Iron deficiency anemia 05/27/2011  . Myocardial infarction   . Pneumonia    hx of  . Renal insufficiency   . Shortness of breath dyspnea    with activity  . Speech problem    from stroke  . Swallowing difficulty   . Vertigo    "when sugar gets low"  . Vision loss    left eye-"half blind"    Past Surgical History:  Procedure Laterality Date  . APPENDECTOMY     with hysterectomy  . CATARACT EXTRACTION Bilateral 5 years ago  . CHOLECYSTECTOMY    . CORONARY ANGIOPLASTY     prior to 2006 5 stents  . CORONARY ARTERY BYPASS GRAFT  2006  . I & D of Furuncle  April 2013  . KNEE SURGERY Right   . TOTAL  ABDOMINAL HYSTERECTOMY  ~82 years old   complete, with tumor removal  . TOTAL KNEE ARTHROPLASTY Right 08/13/2015   Procedure: RIGHT  TOTAL KNEE ARTHROPLASTY;  Surgeon: Paralee Cancel, MD;  Location: [facility];  Service: Orthopedics;  Laterality: Right;    There were no vitals filed for this visit.      Subjective Assessment - 09/15/16 1116    Subjective Reports that she thinks she has a kidney infection and is sore.   Limitations Walking   Patient Stated Goals Walk better.   Currently in Pain? Yes   Pain Score 3    Pain Location Back   Pain Descriptors / Indicators Sore            [facility] PT Assessment - 09/15/16 0001      Assessment   Medical Diagnosis Unsteady gait.     Precautions   Precautions Fall     Restrictions   Weight Bearing Restrictions No                     [facility] Adult PT Treatment/Exercise - 09/15/16 0001      Exercises  Exercises Knee/Hip     Knee/Hip Exercises: Aerobic   Nustep L3 x21 min     Knee/Hip Exercises: Seated   Long Arc Quad Strengthening;Both;2 sets;10 reps;Weights   Long Arc Quad Weight 3 lbs.   Clamshell with TheraBand Red  x20 reps   Sit to Sand 2 sets;10 reps;with UE support             Balance Exercises - 09/15/16 1204      Balance Exercises: Standing   Standing Eyes Opened Wide (BOA);Foam/compliant surface  intermittant UE support x3 min   Step Ups Forward;6 inch;UE support 2  x15 reps each   Other Standing Exercises DLS airex across midline reaching x2 min; B toe taps x2 min             PT Short Term Goals - 09/09/16 1246      PT SHORT TERM GOAL #1   Title Berg score to 30/56.   Time 4   Period Weeks   Status New           PT Long Term Goals - 09/09/16 1247      PT LONG TERM GOAL #1   Title Ind with an advanced HEP.   Time 8   Period Weeks   Status New     PT LONG TERM GOAL #2   Title Left hip strength to a solid 4+/5.   Time 8   Period Weeks   Status New     PT LONG TERM GOAL  #3   Title Patient walk in clinic 500 feet with a FWW in an upright posture.   Time 8   Period Weeks   Status New               Plan - 09/15/16 1207    Clinical Impression Statement Patient arrived to treatment with continued reports of back soreness to which she contributes to a possible kidney infection. Patient tolerated seated LE strengthening well and required moderate multimodal cueing for proper technique especially demonstration. Patient experienced L knee popping per patient report that was audibly observed intermittantly at exercise. Patient required intermittant to 2 hand assist with parallel bars with balance exercises as well as close supervision. Patient weightbears more through heels with posterior COG to which patient reports that she often falls backwards when she loses her balance.   PT Next Visit Plan Continue LE strengthening as well gait and balance training per MPT POC.   Consulted and Agree with Plan of Care Patient      Patient will benefit from skilled therapeutic intervention in order to improve the following deficits and impairments:     Visit Diagnosis: Ataxic gait  Other abnormalities of gait and mobility  Muscle weakness (generalized)  Abnormal posture     Problem List Patient Active Problem List   Diagnosis Date Noted  . Overweight (BMI 25.0-29.9) 05/08/2016  . UTI (lower urinary tract infection) 11/12/2015  . DM hyperosmolarity type II, uncontrolled ([place]) 11/12/2015  . Dementia   . Encephalopathy, metabolic 00/92/3300  . S/P right TKA 08/13/2015  . S/P knee replacement 08/13/2015  . Elevated troponin 03/06/2015  . Fever   . History of diabetes mellitus   . Left buttock pain   . Hyperlipidemia 01/11/2015  . GAD (generalized anxiety disorder) 01/11/2015  . Depression 01/11/2015  . GERD (gastroesophageal reflux disease) 01/11/2015  . Osteopenia 07/13/2014  . Obesity (BMI 30-39.9) 06/19/2014  . Carotid stenosis   . Anemia of chronic  disease 12/25/2011  .  Iron deficiency anemia 05/27/2011  . Diastolic CHF, chronic ([place]) 04/02/2010  . Type 2 diabetes mellitus with insulin therapy ([place]) 10/08/2009  . DYSLIPIDEMIA 10/08/2009  . Essential hypertension 10/08/2009  . Coronary atherosclerosis 10/08/2009  . CAROTID STENOSIS 10/08/2009    Ahmed Prima, PTA 09/15/16 12:15 PM  [facility] [place], [place], [address] Phone: [phone]   Fax:  [phone]  Name: [name] MRN: [id number] Date of Birth: [birth year]

## 2016-09-17 ENCOUNTER — Encounter: Payer: Self-pay | Admitting: Physical Therapy

## 2016-09-17 ENCOUNTER — Other Ambulatory Visit: Payer: Self-pay

## 2016-09-17 ENCOUNTER — Ambulatory Visit: Payer: Medicare Other | Admitting: Physical Therapy

## 2016-09-17 DIAGNOSIS — R2689 Other abnormalities of gait and mobility: Secondary | ICD-10-CM

## 2016-09-17 DIAGNOSIS — M6281 Muscle weakness (generalized): Secondary | ICD-10-CM

## 2016-09-17 DIAGNOSIS — R293 Abnormal posture: Secondary | ICD-10-CM | POA: Diagnosis not present

## 2016-09-17 DIAGNOSIS — R260 Ataxic gait: Secondary | ICD-10-CM

## 2016-09-17 MED ORDER — LORAZEPAM 0.5 MG PO TABS: ORAL_TABLET | ORAL | 1 refills | Status: DC

## 2016-09-17 NOTE — Telephone Encounter (Signed)
Go ahead and phone it in, at such a small dose.

## 2016-09-17 NOTE — Therapy (Signed)
[facility] [place], [place], [address] Phone: [phone]   Fax:  [phone]  Physical Therapy Treatment  Patient Details  Name: [name] MRN: [id number] Date of Birth: 29-Mar-1931 Referring Provider: Alain Honey MD.  Encounter Date: 09/17/2016      PT End of Session - 09/17/16 1301    Visit Number 3   Number of Visits 16   Date for PT Re-Evaluation 11/08/16   PT Start Time 1230   PT Stop Time 1311   PT Time Calculation (min) 41 min   Activity Tolerance Patient tolerated treatment well   Behavior During Therapy [facility] for tasks assessed/performed      Past Medical History:  Diagnosis Date  . Anemia   . Anxiety   . Arthritis    back, knees, and hips  . Asthma    with allergies  . Balance problems   . CAD (coronary artery disease)   . Carotid stenosis   . CHF (congestive heart failure) ([place])    EF preserved Echo 2012  . Chronic kidney infection   . 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 Dementia    "a little"  . Depression   . Diabetes mellitus    type 2  . Furuncle of back, except buttock   . GERD (gastroesophageal reflux disease)   . H/O seasonal allergies   . Hearing loss   . Hypertension   . Iron deficiency anemia 05/27/2011  . Myocardial infarction   . Pneumonia    hx of  . Renal insufficiency   . Shortness of breath dyspnea    with activity  . Speech problem    from stroke  . Swallowing difficulty   . Vertigo    "when sugar gets low"  . Vision loss    left eye-"half blind"    Past Surgical History:  Procedure Laterality Date  . APPENDECTOMY     with hysterectomy  . CATARACT EXTRACTION Bilateral 5 years ago  . CHOLECYSTECTOMY    . CORONARY ANGIOPLASTY     prior to 2006 5 stents  . CORONARY ARTERY BYPASS GRAFT  2006  . I & D of Furuncle  April 2013  . KNEE SURGERY Right   . TOTAL  ABDOMINAL HYSTERECTOMY  ~82 years old   complete, with tumor removal  . TOTAL KNEE ARTHROPLASTY Right 08/13/2015   Procedure: RIGHT  TOTAL KNEE ARTHROPLASTY;  Surgeon: Paralee Cancel, MD;  Location: [facility];  Service: Orthopedics;  Laterality: Right;    There were no vitals filed for this visit.      Subjective Assessment - 09/17/16 1233    Subjective Patient reported falling down onto knees last wednesday at home, was ok but has some bruising   Patient is accompained by: Family member   Limitations Walking   Patient Stated Goals Walk better.   Currently in Pain? Yes   Pain Score 4    Pain Location Back   Pain Orientation Left   Pain Descriptors / Indicators Sore   Pain Type Acute pain   Pain Radiating Towards left hip   Pain Frequency Constant   Aggravating Factors  prolong walking or activity   Pain Relieving Factors meds                         [facility] Adult PT Treatment/Exercise - 09/17/16 0001  Knee/Hip Exercises: Aerobic   Nustep L3 x58min UE/LE monitored for progresion     Knee/Hip Exercises: Seated   Long Arc Quad Strengthening;Both;2 sets;10 reps;Weights   Long Arc Quad Weight 3 lbs.   Clamshell with TheraBand Red  x30   Marching Limitations 2x10   Marching Weights 3 lbs.             Balance Exercises - 09/17/16 1248      Balance Exercises: Standing   Standing Eyes Opened Wide (BOA);Foam/compliant surface  CGA/SBA x20min   Step Ups Forward;6 inch;UE support 2  2x10 each, then toe taps 4"step 2x10   Sidestepping Upper extremity support  in parallel bars x6   Heel Raises Limitations 2x10   Sit to Stand Time x10 with uni UE support             PT Short Term Goals - 09/09/16 1246      PT SHORT TERM GOAL #1   Title Berg score to 30/56.   Time 4   Period Weeks   Status New           PT Long Term Goals - 09/09/16 1247      PT LONG TERM GOAL #1   Title Ind with an advanced HEP.   Time 8   Period Weeks   Status New      PT LONG TERM GOAL #2   Title Left hip strength to a solid 4+/5.   Time 8   Period Weeks   Status New     PT LONG TERM GOAL #3   Title Patient walk in clinic 500 feet with a FWW in an upright posture.   Time 8   Period Weeks   Status New               Plan - 09/17/16 1307    Clinical Impression Statement Patient progressing well today with good tolerance to treatment and no reported pain. Patient had no LOB during treatment and was SBA/CGA throughout for safety. Patient progressing toward current goals yet ongoing due to balance and strength deficits.   PT Treatment/Interventions ADLs/Self Care Home Management;Gait training;Functional mobility training;Therapeutic activities;Therapeutic exercise;Balance training;Neuromuscular re-education;Patient/family education   PT Next Visit Plan Continue LE strengthening as well gait and balance training per MPT POC.   Consulted and Agree with Plan of Care Patient      Patient will benefit from skilled therapeutic intervention in order to improve the following deficits and impairments:     Visit Diagnosis: Ataxic gait  Other abnormalities of gait and mobility  Muscle weakness (generalized)     Problem List Patient Active Problem List   Diagnosis Date Noted  . Overweight (BMI 25.0-29.9) 05/08/2016  . UTI (lower urinary tract infection) 11/12/2015  . DM hyperosmolarity type II, uncontrolled ([place]) 11/12/2015  . Dementia   . Encephalopathy, metabolic 41/28/7867  . S/P right TKA 08/13/2015  . S/P knee replacement 08/13/2015  . Elevated troponin 03/06/2015  . Fever   . History of diabetes mellitus   . Left buttock pain   . Hyperlipidemia 01/11/2015  . GAD (generalized anxiety disorder) 01/11/2015  . Depression 01/11/2015  . GERD (gastroesophageal reflux disease) 01/11/2015  . Osteopenia 07/13/2014  . Obesity (BMI 30-39.9) 06/19/2014  . Carotid stenosis   . Anemia of chronic disease 12/25/2011  . Iron deficiency anemia  05/27/2011  . Diastolic CHF, chronic ([place]) 04/02/2010  . Type 2 diabetes mellitus with insulin therapy ([place]) 10/08/2009  . DYSLIPIDEMIA 10/08/2009  .  Essential hypertension 10/08/2009  . Coronary atherosclerosis 10/08/2009  . CAROTID STENOSIS 10/08/2009    Adonay Scheier P, PTA 09/17/2016, 1:12 PM  [facility] [place], [place], [address] Phone: [phone]   Fax:  [phone]  Name: [name] MRN: [id number] Date of Birth: 1931/03/17

## 2016-09-18 NOTE — Telephone Encounter (Signed)
Rx called in 

## 2016-09-22 ENCOUNTER — Ambulatory Visit: Payer: Medicare Other | Admitting: *Deleted

## 2016-09-22 DIAGNOSIS — R2689 Other abnormalities of gait and mobility: Secondary | ICD-10-CM | POA: Diagnosis not present

## 2016-09-22 DIAGNOSIS — R260 Ataxic gait: Secondary | ICD-10-CM

## 2016-09-22 DIAGNOSIS — R293 Abnormal posture: Secondary | ICD-10-CM | POA: Diagnosis not present

## 2016-09-22 DIAGNOSIS — M6281 Muscle weakness (generalized): Secondary | ICD-10-CM | POA: Diagnosis not present

## 2016-09-22 NOTE — Therapy (Signed)
[facility] [place], [place], [address] Phone: [phone]   Fax:  [phone]  Physical Therapy Treatment  Patient Details  Name: [name] MRN: [id number] Date of Birth: [birth year] Referring Provider: Alain Honey MD.  Encounter Date: 09/22/2016      PT End of Session - 09/22/16 1202    Visit Number 4   Number of Visits 16   Date for PT Re-Evaluation 11/08/16   PT Start Time 1115   PT Stop Time 1203   PT Time Calculation (min) 48 min      Past Medical History:  Diagnosis Date  . Anemia   . Anxiety   . Arthritis    back, knees, and hips  . Asthma    with allergies  . Balance problems   . CAD (coronary artery disease)   . Carotid stenosis   . CHF (congestive heart failure) ([place])    EF preserved Echo 2012  . Chronic kidney infection   . 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 Dementia    "a little"  . Depression   . Diabetes mellitus    type 2  . Furuncle of back, except buttock   . GERD (gastroesophageal reflux disease)   . H/O seasonal allergies   . Hearing loss   . Hypertension   . Iron deficiency anemia 05/27/2011  . Myocardial infarction   . Pneumonia    hx of  . Renal insufficiency   . Shortness of breath dyspnea    with activity  . Speech problem    from stroke  . Swallowing difficulty   . Vertigo    "when sugar gets low"  . Vision loss    left eye-"half blind"    Past Surgical History:  Procedure Laterality Date  . APPENDECTOMY     with hysterectomy  . CATARACT EXTRACTION Bilateral 5 years ago  . CHOLECYSTECTOMY    . CORONARY ANGIOPLASTY     prior to 2006 5 stents  . CORONARY ARTERY BYPASS GRAFT  2006  . I & D of Furuncle  April 2013  . KNEE SURGERY Right   . TOTAL ABDOMINAL HYSTERECTOMY  ~82 years old   complete, with tumor removal  . TOTAL KNEE ARTHROPLASTY Right 08/13/2015   Procedure: RIGHT  TOTAL KNEE ARTHROPLASTY;  Surgeon: Paralee Cancel, MD;  Location: [facility];  Service: Orthopedics;  Laterality: Right;    There were no vitals filed for this visit.      Subjective Assessment - 09/22/16 1132    Subjective Doing ok today, but still having mm spasms in my back   Patient is accompained by: Family member   Limitations Walking   Patient Stated Goals Walk better.   Currently in Pain? Yes   Pain Score 4    Pain Location Back   Pain Orientation Left   Pain Descriptors / Indicators Sore   Pain Type Acute pain   Pain Frequency Constant                         [facility] Adult PT Treatment/Exercise - 09/22/16 0001      Exercises   Exercises Knee/Hip     Knee/Hip Exercises: Aerobic   Nustep L5  x 16min UE/LE monitored for progresion     Knee/Hip Exercises: Seated   Long Arc Quad Strengthening;Both;10 reps;Weights;3 sets   [employer]  Quad Weight 3 lbs.   Clamshell with TheraBand Red  x30             Balance Exercises - 09/22/16 1135      Balance Exercises: Standing   Standing Eyes Opened Wide (BOA);Foam/compliant surface  UE raching diagonals   Step Ups Forward;6 inch;UE support 2  TOE TAPS   Sidestepping Upper extremity support  in parallel bars x6   Heel Raises Limitations 2x10   Sit to Stand Time x10 with uni UE support  SBA             PT Short Term Goals - 09/09/16 1246      PT SHORT TERM GOAL #1   Title Berg score to 30/56.   Time 4   Period Weeks   Status New           PT Long Term Goals - 09/09/16 1247      PT LONG TERM GOAL #1   Title Ind with an advanced HEP.   Time 8   Period Weeks   Status New     PT LONG TERM GOAL #2   Title Left hip strength to a solid 4+/5.   Time 8   Period Weeks   Status New     PT LONG TERM GOAL #3   Title Patient walk in clinic 500 feet with a FWW in an upright posture.   Time 8   Period Weeks   Status New               Plan - 09/22/16 1203     Clinical Impression Statement Pt was feeling fairly well today and was able to erform all her balance act.'s and strengthening exs without increased back pain or spasms.  She was SBA and CGA during balance act.'s for safety, but had no LOB today.   PT Frequency 2x / week   PT Duration 8 weeks   PT Treatment/Interventions ADLs/Self Care Home Management;Gait training;Functional mobility training;Therapeutic activities;Therapeutic exercise;Balance training;Neuromuscular re-education;Patient/family education   PT Next Visit Plan Continue LE strengthening as well gait and balance training per MPT POC.   Consulted and Agree with Plan of Care Patient      Patient will benefit from skilled therapeutic intervention in order to improve the following deficits and impairments:  Decreased balance, Decreased strength  Visit Diagnosis: Ataxic gait  Other abnormalities of gait and mobility  Muscle weakness (generalized)     Problem List Patient Active Problem List   Diagnosis Date Noted  . Overweight (BMI 25.0-29.9) 05/08/2016  . UTI (lower urinary tract infection) 11/12/2015  . DM hyperosmolarity type II, uncontrolled ([place]) 11/12/2015  . Dementia   . Encephalopathy, metabolic 16/09/9603  . S/P right TKA 08/13/2015  . S/P knee replacement 08/13/2015  . Elevated troponin 03/06/2015  . Fever   . History of diabetes mellitus   . Left buttock pain   . Hyperlipidemia 01/11/2015  . GAD (generalized anxiety disorder) 01/11/2015  . Depression 01/11/2015  . GERD (gastroesophageal reflux disease) 01/11/2015  . Osteopenia 07/13/2014  . Obesity (BMI 30-39.9) 06/19/2014  . Carotid stenosis   . Anemia of chronic disease 12/25/2011  . Iron deficiency anemia 05/27/2011  . Diastolic CHF, chronic ([place]) 04/02/2010  . Type 2 diabetes mellitus with insulin therapy ([place]) 10/08/2009  . DYSLIPIDEMIA 10/08/2009  . Essential hypertension 10/08/2009  . Coronary atherosclerosis 10/08/2009  . CAROTID STENOSIS  10/08/2009    Kairi Harshbarger,CHRIS , PTA 09/22/2016, 12:14 PM  [facility] [place], [place], [address] Phone: [phone]   Fax:  [phone]  Name: TELESHA DEGUZMAN MRN: [id number] Date of Birth: 02-Nov-1931

## 2016-09-24 ENCOUNTER — Ambulatory Visit: Payer: Medicare Other | Admitting: Physical Therapy

## 2016-09-24 ENCOUNTER — Encounter: Payer: Self-pay | Admitting: Physical Therapy

## 2016-09-24 DIAGNOSIS — R260 Ataxic gait: Secondary | ICD-10-CM | POA: Diagnosis not present

## 2016-09-24 DIAGNOSIS — R2689 Other abnormalities of gait and mobility: Secondary | ICD-10-CM | POA: Diagnosis not present

## 2016-09-24 DIAGNOSIS — M6281 Muscle weakness (generalized): Secondary | ICD-10-CM

## 2016-09-24 DIAGNOSIS — R293 Abnormal posture: Secondary | ICD-10-CM | POA: Diagnosis not present

## 2016-09-24 NOTE — Therapy (Signed)
[facility] [place], [place], [address] Phone: [phone]   Fax:  [phone]  Physical Therapy Treatment  Patient Details  Name: [name] MRN: [id number] Date of Birth: September 13, 1931 Referring Provider: Alain Honey MD.  Encounter Date: 09/24/2016      PT End of Session - 09/24/16 1155    Visit Number 5   Number of Visits 16   Date for PT Re-Evaluation 11/08/16   PT Start Time 1116   PT Stop Time 1157   PT Time Calculation (min) 41 min   Activity Tolerance Patient tolerated treatment well   Behavior During Therapy [facility] for tasks assessed/performed      Past Medical History:  Diagnosis Date  . Anemia   . Anxiety   . Arthritis    back, knees, and hips  . Asthma    with allergies  . Balance problems   . CAD (coronary artery disease)   . Carotid stenosis   . CHF (congestive heart failure) ([place])    EF preserved Echo 2012  . Chronic kidney infection   . 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 Dementia    "a little"  . Depression   . Diabetes mellitus    type 2  . Furuncle of back, except buttock   . GERD (gastroesophageal reflux disease)   . H/O seasonal allergies   . Hearing loss   . Hypertension   . Iron deficiency anemia 05/27/2011  . Myocardial infarction   . Pneumonia    hx of  . Renal insufficiency   . Shortness of breath dyspnea    with activity  . Speech problem    from stroke  . Swallowing difficulty   . Vertigo    "when sugar gets low"  . Vision loss    left eye-"half blind"    Past Surgical History:  Procedure Laterality Date  . APPENDECTOMY     with hysterectomy  . CATARACT EXTRACTION Bilateral 5 years ago  . CHOLECYSTECTOMY    . CORONARY ANGIOPLASTY     prior to 2006 5 stents  . CORONARY ARTERY BYPASS GRAFT  2006  . I & D of Furuncle  April 2013  . KNEE SURGERY Right   . TOTAL  ABDOMINAL HYSTERECTOMY  ~82 years old   complete, with tumor removal  . TOTAL KNEE ARTHROPLASTY Right 08/13/2015   Procedure: RIGHT  TOTAL KNEE ARTHROPLASTY;  Surgeon: Paralee Cancel, MD;  Location: [facility];  Service: Orthopedics;  Laterality: Right;    There were no vitals filed for this visit.      Subjective Assessment - 09/24/16 1125    Subjective Patient reported feeling ok after last treatment   Patient is accompained by: Family member   Limitations Walking   Patient Stated Goals Walk better.   Currently in Pain? Yes   Pain Score 4    Pain Location Back   Pain Orientation Left   Pain Descriptors / Indicators Sore   Pain Type Acute pain   Pain Radiating Towards left hip   Pain Frequency Constant   Aggravating Factors  prolong walking or activity   Pain Relieving Factors medication            [facility] PT Assessment - 09/24/16 0001      Berg Balance Test   Sit to Stand Able to stand  independently using hands   Standing Unsupported Able  to stand 2 minutes with supervision   Sitting with Back Unsupported but Feet Supported on Floor or Stool Able to sit safely and securely 2 minutes   Stand to Sit Uses backs of legs against chair to control descent   Transfers Able to transfer safely, definite need of hands   Standing Unsupported with Eyes Closed Able to stand 10 seconds with supervision   Standing Ubsupported with Feet Together Needs help to attain position but able to stand for 30 seconds with feet together   From Standing, Reach Forward with Outstretched Arm Can reach forward >5 cm safely (2")   From Standing Position, Pick up Object from Floor Unable to pick up shoe, but reaches 2-5 cm (1-2") from shoe and balances independently   From Standing Position, Turn to Look Behind Over each Shoulder Looks behind one side only/other side shows less weight shift   Turn 360 Degrees Able to turn 360 degrees safely but slowly   Standing Unsupported, Alternately Place Feet on Step/Stool  Able to complete >2 steps/needs minimal assist   Standing Unsupported, One Foot in Front Needs help to step but can hold 15 seconds   Standing on One Leg Unable to try or needs assist to prevent fall   Total Score 30                     [facility] Adult PT Treatment/Exercise - 09/24/16 0001      Knee/Hip Exercises: Aerobic   Nustep L5  x 40min UE/LE monitored for progresion     Knee/Hip Exercises: Standing   Hip ADduction Strengthening;Left;2 sets;10 reps     Knee/Hip Exercises: Seated   Long Arc Quad Strengthening;Both;10 reps;Weights;3 sets   [employer] Weight 3 lbs.   Clamshell with TheraBand Red  x30   Marching Limitations 2x10   Marching Weights 3 lbs.             Balance Exercises - 09/24/16 1145      Balance Exercises: Standing   Standing Eyes Opened Wide (BOA);Narrow base of support (BOS);Foam/compliant surface;Solid surface;4 reps   Standing Eyes Closed Wide (BOA);10 secs   Tandem Stance Upper extremity support 2;Eyes open;4 reps   SLS Eyes open;Upper extremity support 2;Limitations  unable   Step Ups Forward;6 inch;UE support 2  then toe taps 2x10   Sit to Stand Time x10 with uni UE support             PT Short Term Goals - 09/24/16 1150      PT SHORT TERM GOAL #1   Title Berg score to 30/56.   Time 4   Period Weeks   Status On-going  BERG 30/56 09/24/16           PT Long Term Goals - 09/24/16 1152      PT LONG TERM GOAL #1   Title Ind with an advanced HEP.   Time 8   Period Weeks   Status On-going     PT LONG TERM GOAL #2   Title Left hip strength to a solid 4+/5.   Time 8   Period Weeks   Status On-going     PT LONG TERM GOAL #3   Title Patient walk in clinic 500 feet with a FWW in an upright posture.   Time 8   Period Weeks   Status On-going               Plan - 09/24/16 1152    Clinical  Impression Statement Patient progressing overall with improved balance and endurance tolerance. Patient improved  BERG score 30/56 today. Patient has reported no falls at this time. Patient current goals progressing yet ongoing due to balance and strength deficits.   PT Frequency 2x / week   PT Duration 8 weeks   PT Treatment/Interventions ADLs/Self Care Home Management;Gait training;Functional mobility training;Therapeutic activities;Therapeutic exercise;Balance training;Neuromuscular re-education;Patient/family education   PT Next Visit Plan Continue LE strengthening as well gait and balance training per MPT POC.   Consulted and Agree with Plan of Care Patient      Patient will benefit from skilled therapeutic intervention in order to improve the following deficits and impairments:  Decreased balance, Decreased strength  Visit Diagnosis: Ataxic gait  Other abnormalities of gait and mobility  Muscle weakness (generalized)     Problem List Patient Active Problem List   Diagnosis Date Noted  . Overweight (BMI 25.0-29.9) 05/08/2016  . UTI (lower urinary tract infection) 11/12/2015  . DM hyperosmolarity type II, uncontrolled ([place]) 11/12/2015  . Dementia   . Encephalopathy, metabolic 88/32/5498  . S/P right TKA 08/13/2015  . S/P knee replacement 08/13/2015  . Elevated troponin 03/06/2015  . Fever   . History of diabetes mellitus   . Left buttock pain   . Hyperlipidemia 01/11/2015  . GAD (generalized anxiety disorder) 01/11/2015  . Depression 01/11/2015  . GERD (gastroesophageal reflux disease) 01/11/2015  . Osteopenia 07/13/2014  . Obesity (BMI 30-39.9) 06/19/2014  . Carotid stenosis   . Anemia of chronic disease 12/25/2011  . Iron deficiency anemia 05/27/2011  . Diastolic CHF, chronic ([place]) 04/02/2010  . Type 2 diabetes mellitus with insulin therapy ([place]) 10/08/2009  . DYSLIPIDEMIA 10/08/2009  . Essential hypertension 10/08/2009  . Coronary atherosclerosis 10/08/2009  . CAROTID STENOSIS 10/08/2009    Phillips Climes, PTA 09/24/2016, 11:57 AM  [facility] [place], [place], [address] Phone: [phone]   Fax:  [phone]  Name: [name] MRN: [id number] Date of Birth: 05/11/1931

## 2016-09-29 ENCOUNTER — Ambulatory Visit: Payer: Medicare Other | Admitting: *Deleted

## 2016-09-29 DIAGNOSIS — M6281 Muscle weakness (generalized): Secondary | ICD-10-CM

## 2016-09-29 DIAGNOSIS — R260 Ataxic gait: Secondary | ICD-10-CM | POA: Diagnosis not present

## 2016-09-29 DIAGNOSIS — R293 Abnormal posture: Secondary | ICD-10-CM | POA: Diagnosis not present

## 2016-09-29 DIAGNOSIS — R2689 Other abnormalities of gait and mobility: Secondary | ICD-10-CM | POA: Diagnosis not present

## 2016-09-29 NOTE — Therapy (Signed)
[facility] [place], [place], [address] Phone: [phone]   Fax:  [phone]  Physical Therapy Treatment  Patient Details  Name: [name] MRN: [id number] Date of Birth: 01/01/1931 Referring Provider: Alain Honey MD.  Encounter Date: 09/29/2016      PT End of Session - 09/29/16 1203    Visit Number 6   Number of Visits 16   Date for PT Re-Evaluation 11/08/16   PT Start Time 1110   PT Stop Time 1200   PT Time Calculation (min) 50 min      Past Medical History:  Diagnosis Date  . Anemia   . Anxiety   . Arthritis    back, knees, and hips  . Asthma    with allergies  . Balance problems   . CAD (coronary artery disease)   . Carotid stenosis   . CHF (congestive heart failure) ([place])    EF preserved Echo 2012  . Chronic kidney infection   . 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 Dementia    "a little"  . Depression   . Diabetes mellitus    type 2  . Furuncle of back, except buttock   . GERD (gastroesophageal reflux disease)   . H/O seasonal allergies   . Hearing loss   . Hypertension   . Iron deficiency anemia 05/27/2011  . Myocardial infarction   . Pneumonia    hx of  . Renal insufficiency   . Shortness of breath dyspnea    with activity  . Speech problem    from stroke  . Swallowing difficulty   . Vertigo    "when sugar gets low"  . Vision loss    left eye-"half blind"    Past Surgical History:  Procedure Laterality Date  . APPENDECTOMY     with hysterectomy  . CATARACT EXTRACTION Bilateral 5 years ago  . CHOLECYSTECTOMY    . CORONARY ANGIOPLASTY     prior to 2006 5 stents  . CORONARY ARTERY BYPASS GRAFT  2006  . I & D of Furuncle  April 2013  . KNEE SURGERY Right   . TOTAL ABDOMINAL HYSTERECTOMY  ~82 years old   complete, with tumor removal  . TOTAL KNEE ARTHROPLASTY Right 08/13/2015    Procedure: RIGHT  TOTAL KNEE ARTHROPLASTY;  Surgeon: Paralee Cancel, MD;  Location: [facility];  Service: Orthopedics;  Laterality: Right;    There were no vitals filed for this visit.      Subjective Assessment - 09/29/16 1132    Subjective Patient reported feeling ok after last treatment   Patient is accompained by: Family member   Limitations Walking   Patient Stated Goals Walk better.   Currently in Pain? Yes   Pain Score 4    Pain Location Back   Pain Orientation Left   Pain Descriptors / Indicators Sore                         [facility] Adult PT Treatment/Exercise - 09/29/16 0001      Exercises   Exercises Knee/Hip     Knee/Hip Exercises: Aerobic   Nustep L5  x 20 min UE/LE monitored for progresion     Knee/Hip Exercises: Seated   Long Arc Quad Strengthening;Both;10 reps;Weights;3 sets   [employer] Weight 4 lbs.   Clamshell with TheraBand Red  x30  Marching Limitations 2x10             Balance Exercises - 09/29/16 1148      Balance Exercises: Standing   Standing Eyes Opened Wide (BOA);Foam/compliant surface  UE raching diagonals   Standing Eyes Closed Wide (BOA);10 secs   Step Ups Forward;6 inch;Intermittent UE support  then toe taps 4x10   ( 38 secs to do 10)   Sidestepping Upper extremity support  in parallel bars x10 1 hand today             PT Short Term Goals - 09/24/16 1150      PT SHORT TERM GOAL #1   Title Berg score to 30/56.   Time 4   Period Weeks   Status On-going  BERG 30/56 09/24/16           PT Long Term Goals - 09/24/16 1152      PT LONG TERM GOAL #1   Title Ind with an advanced HEP.   Time 8   Period Weeks   Status On-going     PT LONG TERM GOAL #2   Title Left hip strength to a solid 4+/5.   Time 8   Period Weeks   Status On-going     PT LONG TERM GOAL #3   Title Patient walk in clinic 500 feet with a FWW in an upright posture.   Time 8   Period Weeks   Status On-going                Plan - 09/29/16 1203    Clinical Impression Statement Pt did better today with Rx and was able to progress with Wts for LE strengthening. She also did better with balance act's ,but is still very challenged with toe taps ( x10 in 38 secs).  No new LTGs met today.   PT Frequency 2x / week   PT Duration 8 weeks   PT Treatment/Interventions ADLs/Self Care Home Management;Gait training;Functional mobility training;Therapeutic activities;Therapeutic exercise;Balance training;Neuromuscular re-education;Patient/family education   PT Next Visit Plan Continue LE strengthening as well gait and balance training per MPT POC.   Consulted and Agree with Plan of Care Patient      Patient will benefit from skilled therapeutic intervention in order to improve the following deficits and impairments:  Decreased balance, Decreased strength  Visit Diagnosis: Ataxic gait  Other abnormalities of gait and mobility  Muscle weakness (generalized)     Problem List Patient Active Problem List   Diagnosis Date Noted  . Overweight (BMI 25.0-29.9) 05/08/2016  . UTI (lower urinary tract infection) 11/12/2015  . DM hyperosmolarity type II, uncontrolled ([place]) 11/12/2015  . Dementia   . Encephalopathy, metabolic 67/20/9470  . S/P right TKA 08/13/2015  . S/P knee replacement 08/13/2015  . Elevated troponin 03/06/2015  . Fever   . History of diabetes mellitus   . Left buttock pain   . Hyperlipidemia 01/11/2015  . GAD (generalized anxiety disorder) 01/11/2015  . Depression 01/11/2015  . GERD (gastroesophageal reflux disease) 01/11/2015  . Osteopenia 07/13/2014  . Obesity (BMI 30-39.9) 06/19/2014  . Carotid stenosis   . Anemia of chronic disease 12/25/2011  . Iron deficiency anemia 05/27/2011  . Diastolic CHF, chronic ([place]) 04/02/2010  . Type 2 diabetes mellitus with insulin therapy ([place]) 10/08/2009  . DYSLIPIDEMIA 10/08/2009  . Essential hypertension 10/08/2009  . Coronary atherosclerosis 10/08/2009  .  CAROTID STENOSIS 10/08/2009    Juliani Laduke,CHRIS, PTA 09/29/2016, 12:08 PM  [facility] [address]  [place], [place], [address] Phone: [phone]   Fax:  [phone]  Name: [name] MRN: [id number] Date of Birth: 1931-11-22

## 2016-10-01 ENCOUNTER — Ambulatory Visit: Payer: Medicare Other | Admitting: *Deleted

## 2016-10-01 DIAGNOSIS — R260 Ataxic gait: Secondary | ICD-10-CM | POA: Diagnosis not present

## 2016-10-01 DIAGNOSIS — R2689 Other abnormalities of gait and mobility: Secondary | ICD-10-CM

## 2016-10-01 DIAGNOSIS — R293 Abnormal posture: Secondary | ICD-10-CM | POA: Diagnosis not present

## 2016-10-01 DIAGNOSIS — M6281 Muscle weakness (generalized): Secondary | ICD-10-CM

## 2016-10-01 NOTE — Therapy (Signed)
[facility] [place], [place], [address] Phone: [phone]   Fax:  [phone]  Physical Therapy Treatment  Patient Details  Name: [name] MRN: [id number] Date of Birth: Jul 26, 1931 Referring Provider: Alain Honey MD.  Encounter Date: 10/01/2016      PT End of Session - 10/01/16 1135    Visit Number 7   Number of Visits 16   Date for PT Re-Evaluation 11/08/16   Authorization Type KX     PT Start Time 1115   PT Stop Time 1203   PT Time Calculation (min) 48 min      Past Medical History:  Diagnosis Date  . Anemia   . Anxiety   . Arthritis    back, knees, and hips  . Asthma    with allergies  . Balance problems   . CAD (coronary artery disease)   . Carotid stenosis   . CHF (congestive heart failure) ([place])    EF preserved Echo 2012  . Chronic kidney infection   . 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 Dementia    "a little"  . Depression   . Diabetes mellitus    type 2  . Furuncle of back, except buttock   . GERD (gastroesophageal reflux disease)   . H/O seasonal allergies   . Hearing loss   . Hypertension   . Iron deficiency anemia 05/27/2011  . Myocardial infarction   . Pneumonia    hx of  . Renal insufficiency   . Shortness of breath dyspnea    with activity  . Speech problem    from stroke  . Swallowing difficulty   . Vertigo    "when sugar gets low"  . Vision loss    left eye-"half blind"    Past Surgical History:  Procedure Laterality Date  . APPENDECTOMY     with hysterectomy  . CATARACT EXTRACTION Bilateral 5 years ago  . CHOLECYSTECTOMY    . CORONARY ANGIOPLASTY     prior to 2006 5 stents  . CORONARY ARTERY BYPASS GRAFT  2006  . I & D of Furuncle  April 2013  . KNEE SURGERY Right   . TOTAL ABDOMINAL HYSTERECTOMY  ~82 years old   complete, with tumor removal  . TOTAL KNEE  ARTHROPLASTY Right 08/13/2015   Procedure: RIGHT  TOTAL KNEE ARTHROPLASTY;  Surgeon: Paralee Cancel, MD;  Location: [facility];  Service: Orthopedics;  Laterality: Right;    There were no vitals filed for this visit.      Subjective Assessment - 10/01/16 1134    Subjective Patient reported feeling ok after last treatment, but pain still comes in LB and comes around Ribs.   Patient is accompained by: Family member   Limitations Walking   Patient Stated Goals Walk better.   Currently in Pain? Yes   Pain Score 4    Pain Location Back   Pain Orientation Left   Pain Descriptors / Indicators Sore   Pain Type Acute pain                         [facility] Adult PT Treatment/Exercise - 10/01/16 0001      Knee/Hip Exercises: Aerobic   Nustep L5  x 20 min UE/LE monitored for progresion seat 10, staying around 60 steps per minute today and      Knee/Hip Exercises: Seated  Long [employer] --   Long Arc Quad [employer] --   Sit to [employer] 2 sets;10 reps;with UE support             Balance Exercises - 10/01/16 1143      Balance Exercises: Standing   Standing Eyes Opened Wide (BOA);Foam/compliant surface  UE raching diagonals   Standing Eyes Closed Wide (BOA)   Tandem Stance Upper extremity support 2;Eyes open;4 reps   Step Ups Forward;6 inch;Intermittent UE support  then toe taps 6x10   ( 38 secs to do 10)   Sidestepping Upper extremity support  in parallel bars x10 1 hand today             PT Short Term Goals - 09/24/16 1150      PT SHORT TERM GOAL #1   Title Berg score to 30/56.   Time 4   Period Weeks   Status On-going  BERG 30/56 09/24/16           PT Long Term Goals - 09/24/16 1152      PT LONG TERM GOAL #1   Title Ind with an advanced HEP.   Time 8   Period Weeks   Status On-going     PT LONG TERM GOAL #2   Title Left hip strength to a solid 4+/5.   Time 8   Period Weeks   Status On-going     PT LONG TERM GOAL #3   Title Patient walk in clinic  500 feet with a FWW in an upright posture.   Time 8   Period Weeks   Status On-going               Plan - 10/01/16 1248    Clinical Impression Statement Pt did fairly well toay and was able to meet all STGs at this time and is progressing towards LTGs for strength and ambultion. She also did very well with eyes closed balance Act's today. Goals are ongoing   PT Frequency 2x / week   PT Duration 8 weeks   PT Treatment/Interventions ADLs/Self Care Home Management;Gait training;Functional mobility training;Therapeutic activities;Therapeutic exercise;Balance training;Neuromuscular re-education;Patient/family education   PT Next Visit Plan Continue LE strengthening as well gait and balance training per MPT POC.   Consulted and Agree with Plan of Care Patient      Patient will benefit from skilled therapeutic intervention in order to improve the following deficits and impairments:  Decreased balance, Decreased strength  Visit Diagnosis: Ataxic gait  Other abnormalities of gait and mobility  Muscle weakness (generalized)     Problem List Patient Active Problem List   Diagnosis Date Noted  . Overweight (BMI 25.0-29.9) 05/08/2016  . UTI (lower urinary tract infection) 11/12/2015  . DM hyperosmolarity type II, uncontrolled ([place]) 11/12/2015  . Dementia   . Encephalopathy, metabolic 57/32/2025  . S/P right TKA 08/13/2015  . S/P knee replacement 08/13/2015  . Elevated troponin 03/06/2015  . Fever   . History of diabetes mellitus   . Left buttock pain   . Hyperlipidemia 01/11/2015  . GAD (generalized anxiety disorder) 01/11/2015  . Depression 01/11/2015  . GERD (gastroesophageal reflux disease) 01/11/2015  . Osteopenia 07/13/2014  . Obesity (BMI 30-39.9) 06/19/2014  . Carotid stenosis   . Anemia of chronic disease 12/25/2011  . Iron deficiency anemia 05/27/2011  . Diastolic CHF, chronic ([place]) 04/02/2010  . Type 2 diabetes mellitus with insulin therapy ([place]) 10/08/2009   . DYSLIPIDEMIA 10/08/2009  . Essential hypertension 10/08/2009  .  Coronary atherosclerosis 10/08/2009  . CAROTID STENOSIS 10/08/2009    Vontrell Pullman,CHRIS, PTA 10/01/2016, 12:57 PM  [facility] [address] [place], [place], [address] Phone: [phone]   Fax:  [phone]  Name: BRAYDEE SHIMKUS MRN: [id number] Date of Birth: 02/25/1931

## 2016-10-05 ENCOUNTER — Other Ambulatory Visit: Payer: Self-pay | Admitting: Family Medicine

## 2016-10-06 ENCOUNTER — Ambulatory Visit: Payer: Medicare Other | Admitting: *Deleted

## 2016-10-06 DIAGNOSIS — R2689 Other abnormalities of gait and mobility: Secondary | ICD-10-CM | POA: Diagnosis not present

## 2016-10-06 DIAGNOSIS — R293 Abnormal posture: Secondary | ICD-10-CM | POA: Diagnosis not present

## 2016-10-06 DIAGNOSIS — M6281 Muscle weakness (generalized): Secondary | ICD-10-CM

## 2016-10-06 DIAGNOSIS — R260 Ataxic gait: Secondary | ICD-10-CM | POA: Diagnosis not present

## 2016-10-06 NOTE — Therapy (Signed)
[facility] [place], [place], [address] Phone: [phone]   Fax:  [phone]  Physical Therapy Treatment  Patient Details  Name: [name] MRN: [id number] Date of Birth: 31-May-1931 Referring Provider: Alain Honey MD.  Encounter Date: 10/06/2016      PT End of Session - 10/06/16 1309    Visit Number 8   Number of Visits 16   Date for PT Re-Evaluation 11/08/16   Authorization Type KX     PT Start Time 1115   PT Stop Time 1205   PT Time Calculation (min) 50 min      Past Medical History:  Diagnosis Date  . Anemia   . Anxiety   . Arthritis    back, knees, and hips  . Asthma    with allergies  . Balance problems   . CAD (coronary artery disease)   . Carotid stenosis   . CHF (congestive heart failure) ([place])    EF preserved Echo 2012  . Chronic kidney infection   . 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 Dementia    "a little"  . Depression   . Diabetes mellitus    type 2  . Furuncle of back, except buttock   . GERD (gastroesophageal reflux disease)   . H/O seasonal allergies   . Hearing loss   . Hypertension   . Iron deficiency anemia 05/27/2011  . Myocardial infarction   . Pneumonia    hx of  . Renal insufficiency   . Shortness of breath dyspnea    with activity  . Speech problem    from stroke  . Swallowing difficulty   . Vertigo    "when sugar gets low"  . Vision loss    left eye-"half blind"    Past Surgical History:  Procedure Laterality Date  . APPENDECTOMY     with hysterectomy  . CATARACT EXTRACTION Bilateral 5 years ago  . CHOLECYSTECTOMY    . CORONARY ANGIOPLASTY     prior to 2006 5 stents  . CORONARY ARTERY BYPASS GRAFT  2006  . I & D of Furuncle  April 2013  . KNEE SURGERY Right   . TOTAL ABDOMINAL HYSTERECTOMY  82 years old   complete, with tumor removal  . TOTAL KNEE  ARTHROPLASTY Right 08/13/2015   Procedure: RIGHT  TOTAL KNEE ARTHROPLASTY;  Surgeon: Paralee Cancel, MD;  Location: [facility];  Service: Orthopedics;  Laterality: Right;    There were no vitals filed for this visit.      Subjective Assessment - 10/06/16 1148    Subjective Patient reported feeling ok after last treatment, but pain still comes in LB and comes around Ribs mainly at night   Patient is accompained by: Family member   Limitations Walking   Patient Stated Goals Walk better.   Currently in Pain? No/denies                         [facility] Adult PT Treatment/Exercise - 10/06/16 0001      Exercises   Exercises Knee/Hip     Knee/Hip Exercises: Aerobic   Nustep L5  x 20 min UE/LE monitored for progresion seat 10, staying around 60 steps per minute today and      Knee/Hip Exercises: Seated   Long Arc Quad Strengthening;Both;10 reps;Weights;2 sets   [employer] Weight 4 lbs.  Clamshell with Thera[employer] Red  x30   Marching Limitations 2x10   Sit to Sand 2 sets;10 reps;with UE support             Balance Exercises - 10/06/16 1321      Balance Exercises: Standing   Standing Eyes Opened Wide (BOA);Foam/compliant surface  UE raching diagonals             PT Short Term Goals - 09/24/16 1150      PT SHORT TERM GOAL #1   Title Berg score to 30/56.   Time 4   Period Weeks   Status On-going  BERG 30/56 09/24/16           PT Long Term Goals - 09/24/16 1152      PT LONG TERM GOAL #1   Title Ind with an advanced HEP.   Time 8   Period Weeks   Status On-going     PT LONG TERM GOAL #2   Title Left hip strength to a solid 4+/5.   Time 8   Period Weeks   Status On-going     PT LONG TERM GOAL #3   Title Patient walk in clinic 500 feet with a FWW in an upright posture.   Time 8   Period Weeks   Status On-going               Plan - 10/06/16 1313    Clinical Impression Statement Pt did great today and was able to rest less B/W sets  and performed all exs. Less UE assist with sit to stand today and was more stable on Airex.   PT Frequency 2x / week   PT Duration 8 weeks   PT Treatment/Interventions ADLs/Self Care Home Management;Gait training;Functional mobility training;Therapeutic activities;Therapeutic exercise;Balance training;Neuromuscular re-education;Patient/family education   PT Next Visit Plan Continue LE strengthening as well gait and balance training per MPT POC.   Consulted and Agree with Plan of Care Patient      Patient will benefit from skilled therapeutic intervention in order to improve the following deficits and impairments:  Decreased balance, Decreased strength  Visit Diagnosis: Ataxic gait  Other abnormalities of gait and mobility  Muscle weakness (generalized)     Problem List Patient Active Problem List   Diagnosis Date Noted  . Overweight (BMI 25.0-29.9) 05/08/2016  . UTI (lower urinary tract infection) 11/12/2015  . DM hyperosmolarity type II, uncontrolled ([place]) 11/12/2015  . Dementia   . Encephalopathy, metabolic 40/09/2724  . S/P right TKA 08/13/2015  . S/P knee replacement 08/13/2015  . Elevated troponin 03/06/2015  . Fever   . History of diabetes mellitus   . Left buttock pain   . Hyperlipidemia 01/11/2015  . GAD (generalized anxiety disorder) 01/11/2015  . Depression 01/11/2015  . GERD (gastroesophageal reflux disease) 01/11/2015  . Osteopenia 07/13/2014  . Obesity (BMI 30-39.9) 06/19/2014  . Carotid stenosis   . Anemia of chronic disease 12/25/2011  . Iron deficiency anemia 05/27/2011  . Diastolic CHF, chronic ([place]) 04/02/2010  . Type 2 diabetes mellitus with insulin therapy ([place]) 10/08/2009  . DYSLIPIDEMIA 10/08/2009  . Essential hypertension 10/08/2009  . Coronary atherosclerosis 10/08/2009  . CAROTID STENOSIS 10/08/2009    Lashawnta Burgert,CHRIS , PTA 10/06/2016, 1:22 PM  [facility] [place], [place],  [address] Phone: [phone]   Fax:  [phone]  Name: [name] MRN: [id number] Date of Birth: Oct 04, 1931

## 2016-10-08 ENCOUNTER — Ambulatory Visit: Payer: Medicare Other | Attending: Family Medicine | Admitting: *Deleted

## 2016-10-08 DIAGNOSIS — R260 Ataxic gait: Secondary | ICD-10-CM | POA: Diagnosis not present

## 2016-10-08 DIAGNOSIS — M6281 Muscle weakness (generalized): Secondary | ICD-10-CM | POA: Diagnosis not present

## 2016-10-08 DIAGNOSIS — R2689 Other abnormalities of gait and mobility: Secondary | ICD-10-CM | POA: Diagnosis not present

## 2016-10-08 DIAGNOSIS — R293 Abnormal posture: Secondary | ICD-10-CM | POA: Insufficient documentation

## 2016-10-08 NOTE — Therapy (Signed)
[facility] [place], [place], [address] Phone: [phone]   Fax:  [phone]  Physical Therapy Treatment  Patient Details  Name: [name] MRN: [id number] Date of Birth: 03-01-1931 Referring Provider: Alain Honey MD.  Encounter Date: 10/08/2016      PT End of Session - 10/08/16 1301    Visit Number 9   Number of Visits 16   Date for PT Re-Evaluation 11/08/16   PT Start Time 1137  22 mins late   PT Stop Time 1210   PT Time Calculation (min) 33 min      Past Medical History:  Diagnosis Date  . Anemia   . Anxiety   . Arthritis    back, knees, and hips  . Asthma    with allergies  . Balance problems   . CAD (coronary artery disease)   . Carotid stenosis   . CHF (congestive heart failure) ([place])    EF preserved Echo 2012  . Chronic kidney infection   . 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 Dementia    "a little"  . Depression   . Diabetes mellitus    type 2  . Furuncle of back, except buttock   . GERD (gastroesophageal reflux disease)   . H/O seasonal allergies   . Hearing loss   . Hypertension   . Iron deficiency anemia 05/27/2011  . Myocardial infarction   . Pneumonia    hx of  . Renal insufficiency   . Shortness of breath dyspnea    with activity  . Speech problem    from stroke  . Swallowing difficulty   . Vertigo    "when sugar gets low"  . Vision loss    left eye-"half blind"    Past Surgical History:  Procedure Laterality Date  . APPENDECTOMY     with hysterectomy  . CATARACT EXTRACTION Bilateral 5 years ago  . CHOLECYSTECTOMY    . CORONARY ANGIOPLASTY     prior to 2006 5 stents  . CORONARY ARTERY BYPASS GRAFT  2006  . I & D of Furuncle  April 2013  . KNEE SURGERY Right   . TOTAL ABDOMINAL HYSTERECTOMY  ~82 years old   complete, with tumor removal  . TOTAL KNEE ARTHROPLASTY Right  08/13/2015   Procedure: RIGHT  TOTAL KNEE ARTHROPLASTY;  Surgeon: Paralee Cancel, MD;  Location: [facility];  Service: Orthopedics;  Laterality: Right;    There were no vitals filed for this visit.      Subjective Assessment - 10/08/16 1210    Subjective Patient reported feeling ok after last treatment, but pain still comes in LB and comes around Ribs mainly at night   Patient is accompained by: Family member   Limitations Walking   Patient Stated Goals Walk better.   Currently in Pain? No/denies                         [facility] Adult PT Treatment/Exercise - 10/08/16 0001      Exercises   Exercises Knee/Hip     Knee/Hip Exercises: Aerobic   Nustep L5  x 15 min UE/LE monitored for progresion seat 10, staying around 60 steps per minute today and              Balance Exercises - 10/08/16 1251      Balance Exercises: Standing  Standing Eyes Opened Wide (BOA);Foam/compliant surface  UE raching diagonals   Sit to Stand Time x10 with uni UE support             PT Short Term Goals - 09/24/16 1150      PT SHORT TERM GOAL #1   Title Berg score to 30/56.   Time 4   Period Weeks   Status On-going  BERG 30/56 09/24/16           PT Long Term Goals - 09/24/16 1152      PT LONG TERM GOAL #1   Title Ind with an advanced HEP.   Time 8   Period Weeks   Status On-going     PT LONG TERM GOAL #2   Title Left hip strength to a solid 4+/5.   Time 8   Period Weeks   Status On-going     PT LONG TERM GOAL #3   Title Patient walk in clinic 500 feet with a FWW in an upright posture.   Time 8   Period Weeks   Status On-going               Plan - 10/08/16 1303    Clinical Impression Statement Pt was 22 mins late. She did fairly well with limited  Rx today. She had a LOB x 2 during balance act.'s today that needed assistance to correct. No new goalswere met today.   PT Frequency 2x / week   PT Duration 8 weeks   PT Treatment/Interventions ADLs/Self  Care Home Management;Gait training;Functional mobility training;Therapeutic activities;Therapeutic exercise;Balance training;Neuromuscular re-education;Patient/family education   PT Next Visit Plan Continue LE strengthening as well gait and balance training per MPT POC.   Consulted and Agree with Plan of Care Patient      Patient will benefit from skilled therapeutic intervention in order to improve the following deficits and impairments:  Decreased balance, Decreased strength  Visit Diagnosis: Ataxic gait  Other abnormalities of gait and mobility  Muscle weakness (generalized)     Problem List Patient Active Problem List   Diagnosis Date Noted  . Overweight (BMI 25.0-29.9) 05/08/2016  . UTI (lower urinary tract infection) 11/12/2015  . DM hyperosmolarity type II, uncontrolled ([place]) 11/12/2015  . Dementia   . Encephalopathy, metabolic 00/51/1021  . S/P right TKA 08/13/2015  . S/P knee replacement 08/13/2015  . Elevated troponin 03/06/2015  . Fever   . History of diabetes mellitus   . Left buttock pain   . Hyperlipidemia 01/11/2015  . GAD (generalized anxiety disorder) 01/11/2015  . Depression 01/11/2015  . GERD (gastroesophageal reflux disease) 01/11/2015  . Osteopenia 07/13/2014  . Obesity (BMI 30-39.9) 06/19/2014  . Carotid stenosis   . Anemia of chronic disease 12/25/2011  . Iron deficiency anemia 05/27/2011  . Diastolic CHF, chronic ([place]) 04/02/2010  . Type 2 diabetes mellitus with insulin therapy ([place]) 10/08/2009  . DYSLIPIDEMIA 10/08/2009  . Essential hypertension 10/08/2009  . Coronary atherosclerosis 10/08/2009  . CAROTID STENOSIS 10/08/2009    Asjia Berrios,CHRIS, PTA 10/08/2016, 1:09 PM  [facility] [place], [place], [address] Phone: [phone]   Fax:  [phone]  Name: [name] MRN: [id number] Date of Birth: 1930/12/31

## 2016-10-13 ENCOUNTER — Encounter: Payer: Self-pay | Admitting: Physical Therapy

## 2016-10-13 ENCOUNTER — Ambulatory Visit: Payer: Medicare Other | Admitting: Physical Therapy

## 2016-10-13 DIAGNOSIS — R260 Ataxic gait: Secondary | ICD-10-CM

## 2016-10-13 DIAGNOSIS — M6281 Muscle weakness (generalized): Secondary | ICD-10-CM

## 2016-10-13 DIAGNOSIS — R293 Abnormal posture: Secondary | ICD-10-CM | POA: Diagnosis not present

## 2016-10-13 DIAGNOSIS — R2689 Other abnormalities of gait and mobility: Secondary | ICD-10-CM

## 2016-10-13 NOTE — Therapy (Signed)
[facility] [place], [place], [address] Phone: [phone]   Fax:  [phone]  Physical Therapy Treatment  Patient Details  Name: [name] MRN: [id number] Date of Birth: [birth year] Referring Provider: Alain Honey MD.  Encounter Date: 10/13/2016      PT End of Session - 10/13/16 1150    Visit Number 10   Number of Visits 16   Date for PT Re-Evaluation 11/08/16   Authorization Type KX     PT Start Time 1116   PT Stop Time 1158   PT Time Calculation (min) 42 min   Activity Tolerance Patient tolerated treatment well   Behavior During Therapy [facility] for tasks assessed/performed      Past Medical History:  Diagnosis Date  . Anemia   . Anxiety   . Arthritis    back, knees, and hips  . Asthma    with allergies  . Balance problems   . CAD (coronary artery disease)   . Carotid stenosis   . CHF (congestive heart failure) ([place])    EF preserved Echo 2012  . Chronic kidney infection   . 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 Dementia    "a little"  . Depression   . Diabetes mellitus    type 2  . Furuncle of back, except buttock   . GERD (gastroesophageal reflux disease)   . H/O seasonal allergies   . Hearing loss   . Hypertension   . Iron deficiency anemia 05/27/2011  . Myocardial infarction   . Pneumonia    hx of  . Renal insufficiency   . Shortness of breath dyspnea    with activity  . Speech problem    from stroke  . Swallowing difficulty   . Vertigo    "when sugar gets low"  . Vision loss    left eye-"half blind"    Past Surgical History:  Procedure Laterality Date  . APPENDECTOMY     with hysterectomy  . CATARACT EXTRACTION Bilateral 5 years ago  . CHOLECYSTECTOMY    . CORONARY ANGIOPLASTY     prior to 2006 5 stents  . CORONARY ARTERY BYPASS GRAFT  2006  . I & D of Furuncle  April 2013  . KNEE  SURGERY Right   . TOTAL ABDOMINAL HYSTERECTOMY  ~82 years old   complete, with tumor removal  . TOTAL KNEE ARTHROPLASTY Right 08/13/2015   Procedure: RIGHT  TOTAL KNEE ARTHROPLASTY;  Surgeon: Paralee Cancel, MD;  Location: [facility];  Service: Orthopedics;  Laterality: Right;    There were no vitals filed for this visit.      Subjective Assessment - 10/13/16 1138    Subjective Patient reports good repsonse to treatments and no falls reported   Patient is accompained by: Family member   Limitations Walking   Patient Stated Goals Walk better.   Currently in Pain? No/denies            [facility] PT Assessment - 10/13/16 0001      Berg Balance Test   Sit to Stand Able to stand  independently using hands   Standing Unsupported Able to stand 2 minutes with supervision   Sitting with Back Unsupported but Feet Supported on Floor or Stool Able to sit safely and securely 2 minutes   Stand to Sit Uses backs of legs against chair to control descent   Transfers Able to  transfer safely, definite need of hands   Standing Unsupported with Eyes Closed Able to stand 10 seconds with supervision   Standing Ubsupported with Feet Together Needs help to attain position but able to stand for 30 seconds with feet together   From Standing, Reach Forward with Outstretched Arm Can reach forward >5 cm safely (2")   From Standing Position, Pick up Object from Floor Unable to pick up shoe, but reaches 2-5 cm (1-2") from shoe and balances independently   From Standing Position, Turn to Look Behind Over each Shoulder Looks behind one side only/other side shows less weight shift   Turn 360 Degrees Able to turn 360 degrees safely but slowly   Standing Unsupported, Alternately Place Feet on Step/Stool Able to complete >2 steps/needs minimal assist   Standing Unsupported, One Foot in Front Needs help to step but can hold 15 seconds   Standing on One Leg Unable to try or needs assist to prevent fall   Total Score 30                      [facility] Adult PT Treatment/Exercise - 10/13/16 0001      Knee/Hip Exercises: Aerobic   Nustep L5  x 16 min UE/LE monitored for progresion seat 10, staying around 60 steps per minute today and      Knee/Hip Exercises: Standing   Hip Flexion AROM;Stengthening;Left  2x10   Hip ADduction AROM;Strengthening;Left;2 sets;10 reps  3#     Knee/Hip Exercises: Seated   Abduction/Adduction  Strengthening;Left  hib abd with green t-band 3x10             Balance Exercises - 10/13/16 1146      Balance Exercises: Standing   Standing Eyes Opened Wide (BOA);Foam/compliant surface;Time  38min   Step Ups Forward;6 inch;UE support 2  2x10   Heel Raises Limitations 2x10   Sit to Stand Time x10 with uni UE support             PT Short Term Goals - 10/13/16 1140      PT SHORT TERM GOAL #1   Title Berg score to 30/56.   Time 4   Period Weeks   Status Achieved           PT Long Term Goals - 10/13/16 1141      PT LONG TERM GOAL #1   Title Ind with an advanced HEP.   Time 8   Period Weeks   Status On-going     PT LONG TERM GOAL #2   Title Left hip strength to a solid 4+/5.   Time 8   Period Weeks   Status On-going     PT LONG TERM GOAL #3   Title Patient walk in clinic 500 feet with a FWW in an upright posture.   Time 8   Period Weeks   Status On-going               Plan - 10/13/16 1142    Clinical Impression Statement Patient progressing with all activities today. Patient tolerated treatment well with improved activity tolerance and no LOB during balance traing activities. Patient able to meet STG #1 yet LTG's ongoing due to strength deficits and patient continues to use rollator at this time.    PT Frequency 2x / week   PT Duration 8 weeks   PT Treatment/Interventions ADLs/Self Care Home Management;Gait training;Functional mobility training;Therapeutic activities;Therapeutic exercise;Balance training;Neuromuscular  re-education;Patient/family education   PT Next Visit  Plan Continue LE strengthening as well gait and balance training per MPT POC.   Consulted and Agree with Plan of Care Patient      Patient will benefit from skilled therapeutic intervention in order to improve the following deficits and impairments:  Decreased balance, Decreased strength  Visit Diagnosis: Ataxic gait  Other abnormalities of gait and mobility  Muscle weakness (generalized)  Abnormal posture     Problem List Patient Active Problem List   Diagnosis Date Noted  . Overweight (BMI 25.0-29.9) 05/08/2016  . UTI (lower urinary tract infection) 11/12/2015  . DM hyperosmolarity type II, uncontrolled ([place]) 11/12/2015  . Dementia   . Encephalopathy, metabolic 16/09/9603  . S/P right TKA 08/13/2015  . S/P knee replacement 08/13/2015  . Elevated troponin 03/06/2015  . Fever   . History of diabetes mellitus   . Left buttock pain   . Hyperlipidemia 01/11/2015  . GAD (generalized anxiety disorder) 01/11/2015  . Depression 01/11/2015  . GERD (gastroesophageal reflux disease) 01/11/2015  . Osteopenia 07/13/2014  . Obesity (BMI 30-39.9) 06/19/2014  . Carotid stenosis   . Anemia of chronic disease 12/25/2011  . Iron deficiency anemia 05/27/2011  . Diastolic CHF, chronic ([place]) 04/02/2010  . Type 2 diabetes mellitus with insulin therapy ([place]) 10/08/2009  . DYSLIPIDEMIA 10/08/2009  . Essential hypertension 10/08/2009  . Coronary atherosclerosis 10/08/2009  . CAROTID STENOSIS 10/08/2009    Tamaria Dunleavy P, PTA 10/13/2016, 11:59 AM   Ladean Raya, PTA 10/13/16 11:59 AM  [facility] [place], [place], [address] Phone: [phone]   Fax:  [phone]  Name: KAMAILE ZACHOW MRN: [id number] Date of Birth: [birth year]

## 2016-10-14 ENCOUNTER — Ambulatory Visit (INDEPENDENT_AMBULATORY_CARE_PROVIDER_SITE_OTHER): Payer: Medicare Other | Admitting: Nurse Practitioner

## 2016-10-14 ENCOUNTER — Encounter: Payer: Self-pay | Admitting: Nurse Practitioner

## 2016-10-14 ENCOUNTER — Ambulatory Visit (INDEPENDENT_AMBULATORY_CARE_PROVIDER_SITE_OTHER): Payer: Medicare Other

## 2016-10-14 VITALS — BP 166/78 | HR 83 | Temp 97.4°F | Ht 66.0 in | Wt 165.0 lb

## 2016-10-14 DIAGNOSIS — R109 Unspecified abdominal pain: Secondary | ICD-10-CM | POA: Diagnosis not present

## 2016-10-14 DIAGNOSIS — K5900 Constipation, unspecified: Secondary | ICD-10-CM

## 2016-10-14 DIAGNOSIS — N3000 Acute cystitis without hematuria: Secondary | ICD-10-CM | POA: Diagnosis not present

## 2016-10-14 LAB — URINALYSIS, COMPLETE
Bilirubin, UA: NEGATIVE
Ketones, UA: NEGATIVE
NITRITE UA: POSITIVE — AB
Specific Gravity, UA: 1.025 (ref 1.005–1.030)
Urobilinogen, Ur: 0.2 mg/dL (ref 0.2–1.0)
pH, UA: 5 (ref 5.0–7.5)

## 2016-10-14 LAB — MICROSCOPIC EXAMINATION: WBC, UA: >30 /hpf — AB (ref 0–?)

## 2016-10-14 MED ORDER — CIPROFLOXACIN HCL 500 MG PO TABS: 500.0000 mg | ORAL_TABLET | Freq: Two times a day (BID) | ORAL | 0 refills | Status: DC

## 2016-10-14 NOTE — Addendum Note (Signed)
Addended by: Chevis Pretty on: 10/14/2016 11:36 AM   Modules accepted: Orders

## 2016-10-14 NOTE — Patient Instructions (Signed)

## 2016-10-14 NOTE — Progress Notes (Addendum)
   Subjective:    [name], female    DOB: [birth year], 82 y.o.   MRN: [id number]  HPI  Patient comes in c/o left flank pain. Rates pain 10/10- had small bowel movement yesterday.   Review of Systems  Constitutional: Negative for appetite change.  HENT: Negative.   Respiratory: Negative.   Cardiovascular: Negative.   Gastrointestinal: Positive for constipation. Negative for diarrhea, nausea and vomiting.  Genitourinary: Negative.   Neurological: Negative.   Psychiatric/Behavioral: Negative.   All other systems reviewed and are negative.      Objective:   Physical Exam  Constitutional: She is oriented to person, place, and time. She appears well-developed and well-nourished. She appears distressed (mild).  Cardiovascular: Normal rate, regular rhythm and normal heart sounds.   Pulmonary/Chest: Effort normal and breath sounds normal.  Abdominal: Soft.  Neurological: She is alert and oriented to person, place, and time.  Skin: Skin is warm.  Psychiatric: She has a normal mood and affect. Her behavior is normal. Judgment and thought content normal.    BP (!) 166/78 (BP Location: Left Arm, Cuff Size: Normal)   Pulse 83   Temp 97.4 F (36.3 C) (Oral)   Ht 5\' 6"  (1.676 m)   Wt 165 lb (74.8 kg)   BMI 26.63 kg/m   KUB- large stool burden distal colon-Preliminary reading by Ronnald Collum, FNP  [facility]    Assessment & Plan:   1. Side pain   2. Left flank pain   3. Constipation, unspecified constipation type    Force fluids miralax OTC daily until good results then 2-3 times a week RTO prn  4. UTI Take medication as prescribe Cotton underwear Take shower not bath Cranberry juice, yogurt Force fluids AZO over the counter X2 days Culture pending RTO prn  Meds ordered this encounter  Medications  . ciprofloxacin (CIPRO) 500 MG tablet    Sig: Take 1 tablet (500 mg total) by mouth 2 (two) times daily.    Dispense:  20 tablet    Refill:  0    Order  Specific Question:   Supervising Provider    Answer:   Eustaquio Maize [[id number]]     Mary-Margaret Hassell Done, FNP

## 2016-10-15 ENCOUNTER — Ambulatory Visit: Payer: Medicare Other | Admitting: *Deleted

## 2016-10-15 DIAGNOSIS — R2689 Other abnormalities of gait and mobility: Secondary | ICD-10-CM | POA: Diagnosis not present

## 2016-10-15 DIAGNOSIS — M6281 Muscle weakness (generalized): Secondary | ICD-10-CM | POA: Diagnosis not present

## 2016-10-15 DIAGNOSIS — R260 Ataxic gait: Secondary | ICD-10-CM

## 2016-10-15 DIAGNOSIS — R293 Abnormal posture: Secondary | ICD-10-CM | POA: Diagnosis not present

## 2016-10-15 NOTE — Therapy (Signed)
[facility] [place], [place], [address] Phone: [phone]   Fax:  [phone]  Physical Therapy Treatment  Patient Details  Name: [name] MRN: [id number] Date of Birth: 11-15-1931 Referring Provider: Alain Honey MD.  Encounter Date: 10/15/2016      PT End of Session - 10/15/16 1020    Visit Number 11   Number of Visits 16   Date for PT Re-Evaluation 11/08/16   Authorization Type KX     PT Start Time 0945   PT Stop Time 1035   PT Time Calculation (min) 50 min   Activity Tolerance Patient tolerated treatment well   Behavior During Therapy [facility] for tasks assessed/performed      Past Medical History:  Diagnosis Date  . Anemia   . Anxiety   . Arthritis    back, knees, and hips  . Asthma    with allergies  . Balance problems   . CAD (coronary artery disease)   . Carotid stenosis   . CHF (congestive heart failure) ([place])    EF preserved Echo 2012  . Chronic kidney infection   . 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 Dementia    "a little"  . Depression   . Diabetes mellitus    type 2  . Furuncle of back, except buttock   . GERD (gastroesophageal reflux disease)   . H/O seasonal allergies   . Hearing loss   . Hypertension   . Iron deficiency anemia 05/27/2011  . Myocardial infarction   . Pneumonia    hx of  . Renal insufficiency   . Shortness of breath dyspnea    with activity  . Speech problem    from stroke  . Swallowing difficulty   . Vertigo    "when sugar gets low"  . Vision loss    left eye-"half blind"    Past Surgical History:  Procedure Laterality Date  . APPENDECTOMY     with hysterectomy  . CATARACT EXTRACTION Bilateral 5 years ago  . CHOLECYSTECTOMY    . CORONARY ANGIOPLASTY     prior to 2006 5 stents  . CORONARY ARTERY BYPASS GRAFT  2006  . I & D of Furuncle  April 2013  . KNEE  SURGERY Right   . TOTAL ABDOMINAL HYSTERECTOMY  ~82 years old   complete, with tumor removal  . TOTAL KNEE ARTHROPLASTY Right 08/13/2015   Procedure: RIGHT  TOTAL KNEE ARTHROPLASTY;  Surgeon: Paralee Cancel, MD;  Location: [facility];  Service: Orthopedics;  Laterality: Right;    There were no vitals filed for this visit.      Subjective Assessment - 10/15/16 1003    Subjective Patient reports good repsonse to treatments and no falls reported. Went to MD about my LBP. Crystals in my bladder and constipated   Patient is accompained by: Family member   Limitations Walking   Patient Stated Goals Walk better.   Currently in Pain? Yes   Pain Score 4    Pain Location Back   Pain Orientation Left                         [facility] Adult PT Treatment/Exercise - 10/15/16 0001      Exercises   Exercises Knee/Hip     Knee/Hip Exercises: Aerobic   Nustep L5  x 20  min UE/LE monitored for  progresion seat 10, staying around 60 steps per minute today and      Knee/Hip Exercises: Seated   Abduction/Adduction  Strengthening;Both;3 sets;10 reps  hib abd with red  t-band 3x10   Sit to Sand 2 sets;10 reps;with UE support  hold balance at top for 3-5 secs             Balance Exercises - 10/15/16 1017      Balance Exercises: Standing   Standing Eyes Opened Wide (BOA);Foam/compliant surface  UE raching diagonals   Step Ups 6 inch;UE support 1  Toe Taps 3x 10   Sidestepping 5 reps             PT Short Term Goals - 10/13/16 1140      PT SHORT TERM GOAL #1   Title Berg score to 30/56.   Time 4   Period Weeks   Status Achieved           PT Long Term Goals - 10/13/16 1141      PT LONG TERM GOAL #1   Title Ind with an advanced HEP.   Time 8   Period Weeks   Status On-going     PT LONG TERM GOAL #2   Title Left hip strength to a solid 4+/5.   Time 8   Period Weeks   Status On-going     PT LONG TERM GOAL #3   Title Patient walk in clinic 500 feet with a FWW  in an upright posture.   Time 8   Period Weeks   Status On-going               Plan - 10/15/16 1352    Clinical Impression Statement Pt did fairly well with balance and strengthening exs today. Her strength is getting better in LT hip 4/5, but unable to 4+/ goal yet and is ongoing   PT Frequency 2x / week   PT Duration 8 weeks   PT Treatment/Interventions ADLs/Self Care Home Management;Gait training;Functional mobility training;Therapeutic activities;Therapeutic exercise;Balance training;Neuromuscular re-education;Patient/family education   PT Next Visit Plan Continue LE strengthening as well gait and balance training per MPT POC.   Consulted and Agree with Plan of Care Patient      Patient will benefit from skilled therapeutic intervention in order to improve the following deficits and impairments:  Decreased balance, Decreased strength  Visit Diagnosis: Ataxic gait  Other abnormalities of gait and mobility  Muscle weakness (generalized)     Problem List Patient Active Problem List   Diagnosis Date Noted  . Overweight (BMI 25.0-29.9) 05/08/2016  . UTI (lower urinary tract infection) 11/12/2015  . DM hyperosmolarity type II, uncontrolled ([place]) 11/12/2015  . Dementia   . Encephalopathy, metabolic 25/36/6440  . S/P right TKA 08/13/2015  . S/P knee replacement 08/13/2015  . Elevated troponin 03/06/2015  . Fever   . History of diabetes mellitus   . Left buttock pain   . Hyperlipidemia 01/11/2015  . GAD (generalized anxiety disorder) 01/11/2015  . Depression 01/11/2015  . GERD (gastroesophageal reflux disease) 01/11/2015  . Osteopenia 07/13/2014  . Obesity (BMI 30-39.9) 06/19/2014  . Carotid stenosis   . Anemia of chronic disease 12/25/2011  . Iron deficiency anemia 05/27/2011  . Diastolic CHF, chronic ([place]) 04/02/2010  . Type 2 diabetes mellitus with insulin therapy ([place]) 10/08/2009  . DYSLIPIDEMIA 10/08/2009  . Essential hypertension 10/08/2009  . Coronary  atherosclerosis 10/08/2009  . CAROTID STENOSIS 10/08/2009    Alekxander Isola,CHRIS, PTA 10/15/2016, 2:20 PM  [facility] [place], [place], [address] Phone: [phone]   Fax:  [phone]  Name: [name] MRN: [id number] Date of Birth: June 08, 1931

## 2016-10-16 LAB — URINE CULTURE

## 2016-10-19 ENCOUNTER — Ambulatory Visit: Payer: Medicare Other | Admitting: Physician Assistant

## 2016-10-19 ENCOUNTER — Ambulatory Visit: Payer: Medicare Other | Admitting: Family Medicine

## 2016-10-20 ENCOUNTER — Ambulatory Visit: Payer: Medicare Other | Admitting: *Deleted

## 2016-10-20 DIAGNOSIS — R260 Ataxic gait: Secondary | ICD-10-CM | POA: Diagnosis not present

## 2016-10-20 DIAGNOSIS — R2689 Other abnormalities of gait and mobility: Secondary | ICD-10-CM

## 2016-10-20 DIAGNOSIS — M6281 Muscle weakness (generalized): Secondary | ICD-10-CM | POA: Diagnosis not present

## 2016-10-20 DIAGNOSIS — R293 Abnormal posture: Secondary | ICD-10-CM | POA: Diagnosis not present

## 2016-10-20 NOTE — Therapy (Signed)
[facility] [place], [place], [address] Phone: [phone]   Fax:  [phone]  Physical Therapy Treatment  Patient Details  Name: [name] MRN: [id number] Date of Birth: 01-08-1931 Referring Provider: Alain Honey MD.  Encounter Date: 10/20/2016      PT End of Session - 10/20/16 1211    Visit Number 12   Number of Visits 16   Date for PT Re-Evaluation 11/08/16   Authorization Type KX     PT Start Time 1115   PT Stop Time 1205   PT Time Calculation (min) 50 min      Past Medical History:  Diagnosis Date  . Anemia   . Anxiety   . Arthritis    back, knees, and hips  . Asthma    with allergies  . Balance problems   . CAD (coronary artery disease)   . Carotid stenosis   . CHF (congestive heart failure) ([place])    EF preserved Echo 2012  . Chronic kidney infection   . 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 Dementia    "a little"  . Depression   . Diabetes mellitus    type 2  . Furuncle of back, except buttock   . GERD (gastroesophageal reflux disease)   . H/O seasonal allergies   . Hearing loss   . Hypertension   . Iron deficiency anemia 05/27/2011  . Myocardial infarction   . Pneumonia    hx of  . Renal insufficiency   . Shortness of breath dyspnea    with activity  . Speech problem    from stroke  . Swallowing difficulty   . Vertigo    "when sugar gets low"  . Vision loss    left eye-"half blind"    Past Surgical History:  Procedure Laterality Date  . APPENDECTOMY     with hysterectomy  . CATARACT EXTRACTION Bilateral 5 years ago  . CHOLECYSTECTOMY    . CORONARY ANGIOPLASTY     prior to 2006 5 stents  . CORONARY ARTERY BYPASS GRAFT  2006  . I & D of Furuncle  April 2013  . KNEE SURGERY Right   . TOTAL ABDOMINAL HYSTERECTOMY  ~82 years old   complete, with tumor removal  . TOTAL KNEE  ARTHROPLASTY Right 08/13/2015   Procedure: RIGHT  TOTAL KNEE ARTHROPLASTY;  Surgeon: Paralee Cancel, MD;  Location: [facility];  Service: Orthopedics;  Laterality: Right;    There were no vitals filed for this visit.      Subjective Assessment - 10/20/16 1136    Subjective Patient reports good repsonse to treatments and no falls reported. Went to MD about my LBP.  I have an infection that is causing LBP   Patient is accompained by: Family member   Limitations Walking   Patient Stated Goals Walk better.   Currently in Pain? Yes   Pain Score 4    Pain Location Back   Pain Orientation Left   Pain Descriptors / Indicators Sore   Pain Type Acute pain   Pain Frequency Constant                         [facility] Adult PT Treatment/Exercise - 10/20/16 0001      Exercises   Exercises Knee/Hip     Knee/Hip Exercises: Aerobic   Nustep L5  x 20  min UE/LE monitored for progresion seat 10, staying around 60 steps per minute today and      Knee/Hip Exercises: Seated   Long Arc Quad Strengthening;Both;10 reps;Weights;2 sets   [employer] Weight 3 lbs.   Abduction/Adduction  Strengthening;Both;3 sets;10 reps  hib abd with red  t-band 3x10   Sit to Sand 2 sets;10 reps;with UE support  hold balance at top for 3-5 secs  No LOB SBA             Balance Exercises - 10/20/16 1200      Balance Exercises: Standing   Standing Eyes Opened Wide (BOA);Foam/compliant surface;Time  43min   Step Ups 6 inch;UE support 1  Toe Taps 3x 10  and SBA/CGA             PT Short Term Goals - 10/13/16 1140      PT SHORT TERM GOAL #1   Title Berg score to 30/56.   Time 4   Period Weeks   Status Achieved           PT Long Term Goals - 10/13/16 1141      PT LONG TERM GOAL #1   Title Ind with an advanced HEP.   Time 8   Period Weeks   Status On-going     PT LONG TERM GOAL #2   Title Left hip strength to a solid 4+/5.   Time 8   Period Weeks   Status On-going     PT LONG  TERM GOAL #3   Title Patient walk in clinic 500 feet with a FWW in an upright posture.   Time 8   Period Weeks   Status On-going               Plan - 10/20/16 1254    Clinical Impression Statement Pt did fairly well today and was able to complete all therex and balance acts with minimal cues. Her LT hip strength was 4/5 and was unable to meet LTG yet due to mild weakness. Pt states her LBP could be from an infection and costipation.Marland Kitchen   PT Frequency 2x / week   PT Duration 8 weeks   PT Treatment/Interventions ADLs/Self Care Home Management;Gait training;Functional mobility training;Therapeutic activities;Therapeutic exercise;Balance training;Neuromuscular re-education;Patient/family education   PT Next Visit Plan Continue LE strengthening as well gait and balance training per MPT POC.   Consulted and Agree with Plan of Care Patient      Patient will benefit from skilled therapeutic intervention in order to improve the following deficits and impairments:  Decreased balance, Decreased strength  Visit Diagnosis: Ataxic gait  Other abnormalities of gait and mobility  Muscle weakness (generalized)     Problem List Patient Active Problem List   Diagnosis Date Noted  . Overweight (BMI 25.0-29.9) 05/08/2016  . UTI (lower urinary tract infection) 11/12/2015  . DM hyperosmolarity type II, uncontrolled ([place]) 11/12/2015  . Dementia   . Encephalopathy, metabolic 26/71/2458  . S/P right TKA 08/13/2015  . S/P knee replacement 08/13/2015  . Elevated troponin 03/06/2015  . Fever   . History of diabetes mellitus   . Left buttock pain   . Hyperlipidemia 01/11/2015  . GAD (generalized anxiety disorder) 01/11/2015  . Depression 01/11/2015  . GERD (gastroesophageal reflux disease) 01/11/2015  . Osteopenia 07/13/2014  . Obesity (BMI 30-39.9) 06/19/2014  . Carotid stenosis   . Anemia of chronic disease 12/25/2011  . Iron deficiency anemia 05/27/2011  . Diastolic CHF, chronic ([place])  04/02/2010  .  Type 2 diabetes mellitus with insulin therapy ([place]) 10/08/2009  . DYSLIPIDEMIA 10/08/2009  . Essential hypertension 10/08/2009  . Coronary atherosclerosis 10/08/2009  . CAROTID STENOSIS 10/08/2009    Tiesha Marich,CHRIS, PTA 10/20/2016, 12:58 PM  [facility] [address] [place], [place], [address] Phone: [phone]   Fax:  [phone]  Name: JAYDON SOROKA MRN: [id number] Date of Birth: [birth year]

## 2016-10-22 ENCOUNTER — Ambulatory Visit: Payer: Medicare Other | Admitting: Physical Therapy

## 2016-10-22 ENCOUNTER — Encounter: Payer: Self-pay | Admitting: Physical Therapy

## 2016-10-22 DIAGNOSIS — M6281 Muscle weakness (generalized): Secondary | ICD-10-CM | POA: Diagnosis not present

## 2016-10-22 DIAGNOSIS — R260 Ataxic gait: Secondary | ICD-10-CM | POA: Diagnosis not present

## 2016-10-22 DIAGNOSIS — R2689 Other abnormalities of gait and mobility: Secondary | ICD-10-CM | POA: Diagnosis not present

## 2016-10-22 DIAGNOSIS — R293 Abnormal posture: Secondary | ICD-10-CM | POA: Diagnosis not present

## 2016-10-22 NOTE — Therapy (Signed)
[facility] [place], [place], [address] Phone: [phone]   Fax:  [phone]  Physical Therapy Treatment  Patient Details  Name: [name] MRN: [id number] Date of Birth: 04-17-1931 Referring Provider: Alain Honey MD.  Encounter Date: 10/22/2016      PT End of Session - 10/22/16 1109    Visit Number 13   Number of Visits 16   Date for PT Re-Evaluation 11/08/16   Authorization Type KX     PT Start Time 1120   PT Stop Time 1203   PT Time Calculation (min) 43 min   Activity Tolerance Patient tolerated treatment well   Behavior During Therapy [facility] for tasks assessed/performed      Past Medical History:  Diagnosis Date  . Anemia   . Anxiety   . Arthritis    back, knees, and hips  . Asthma    with allergies  . Balance problems   . CAD (coronary artery disease)   . Carotid stenosis   . CHF (congestive heart failure) ([place])    EF preserved Echo 2012  . Chronic kidney infection   . 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 Dementia    "a little"  . Depression   . Diabetes mellitus    type 2  . Furuncle of back, except buttock   . GERD (gastroesophageal reflux disease)   . H/O seasonal allergies   . Hearing loss   . Hypertension   . Iron deficiency anemia 05/27/2011  . Myocardial infarction   . Pneumonia    hx of  . Renal insufficiency   . Shortness of breath dyspnea    with activity  . Speech problem    from stroke  . Swallowing difficulty   . Vertigo    "when sugar gets low"  . Vision loss    left eye-"half blind"    Past Surgical History:  Procedure Laterality Date  . APPENDECTOMY     with hysterectomy  . CATARACT EXTRACTION Bilateral 5 years ago  . CHOLECYSTECTOMY    . CORONARY ANGIOPLASTY     prior to 2006 5 stents  . CORONARY ARTERY BYPASS GRAFT  2006  . I & D of Furuncle  April 2013  . KNEE  SURGERY Right   . TOTAL ABDOMINAL HYSTERECTOMY  ~82 years old   complete, with tumor removal  . TOTAL KNEE ARTHROPLASTY Right 08/13/2015   Procedure: RIGHT  TOTAL KNEE ARTHROPLASTY;  Surgeon: Paralee Cancel, MD;  Location: [facility];  Service: Orthopedics;  Laterality: Right;    There were no vitals filed for this visit.      Subjective Assessment - 10/22/16 1109    Subjective Reports that she's sore in her low back and into her hips. Also reports that her bowels are blocked up.   Limitations Walking   Patient Stated Goals Walk better.   Currently in Pain? Yes   Pain Location Back   Pain Orientation Right;Left   Pain Descriptors / Indicators Sore   Pain Type Chronic pain            [facility] PT Assessment - 10/22/16 0001      Assessment   Medical Diagnosis Unsteady gait.     Precautions   Precautions Fall     Restrictions   Weight Bearing Restrictions No  [facility] Adult PT Treatment/Exercise - 10/22/16 0001      Knee/Hip Exercises: Aerobic   Nustep L5  x 20  min UE/LE monitored for progresion seat 10, staying around 60 steps per minute today and      Knee/Hip Exercises: Seated   Long Arc Quad Strengthening;Both;3 sets;10 reps;Weights   Long Arc Quad Weight 4 lbs.   Clamshell with TheraBand Green  2x10 reps             Balance Exercises - 10/22/16 1210      Balance Exercises: Standing   Standing Eyes Opened Foam/compliant surface  2# ball reachouts x20 reps   Standing Eyes Closed Narrow base of support (BOS);Foam/compliant surface  1 UE support x3 min   Tandem Stance Eyes open;Foam/compliant surface;Upper extremity support 1  x3 min   Heel Raises Limitations 2x10 reps   Toe Raise Limitations 2x10 reps             PT Short Term Goals - 10/13/16 1140      PT SHORT TERM GOAL #1   Title Berg score to 30/56.   Time 4   Period Weeks   Status Achieved           PT Long Term Goals - 10/13/16 1141      PT LONG TERM GOAL  #1   Title Ind with an advanced HEP.   Time 8   Period Weeks   Status On-going     PT LONG TERM GOAL #2   Title Left hip strength to a solid 4+/5.   Time 8   Period Weeks   Status On-going     PT LONG TERM GOAL #3   Title Patient walk in clinic 500 feet with a FWW in an upright posture.   Time 8   Period Weeks   Status On-going               Plan - 10/22/16 1211    Clinical Impression Statement Patient tolerated today's treatment fairly well as she arrived with low back and hip soreness. Patient able to complete exercise with VCs but mostly demonstration. Patient required VCs for erect stance with standing balance exercises today as she prefers trunk flexion stance. Patient reported soreness in low back and hips may have decreased some with exercise upon end of treatment.   PT Frequency 2x / week   PT Duration 8 weeks   PT Treatment/Interventions ADLs/Self Care Home Management;Gait training;Functional mobility training;Therapeutic activities;Therapeutic exercise;Balance training;Neuromuscular re-education;Patient/family education   PT Next Visit Plan Continue LE strengthening as well gait and balance training per MPT POC.   Consulted and Agree with Plan of Care Patient      Patient will benefit from skilled therapeutic intervention in order to improve the following deficits and impairments:  Decreased balance, Decreased strength  Visit Diagnosis: Ataxic gait  Other abnormalities of gait and mobility  Muscle weakness (generalized)     Problem List Patient Active Problem List   Diagnosis Date Noted  . Overweight (BMI 25.0-29.9) 05/08/2016  . UTI (lower urinary tract infection) 11/12/2015  . DM hyperosmolarity type II, uncontrolled ([place]) 11/12/2015  . Dementia   . Encephalopathy, metabolic 83/41/9622  . S/P right TKA 08/13/2015  . S/P knee replacement 08/13/2015  . Elevated troponin 03/06/2015  . Fever   . History of diabetes mellitus   . Left buttock pain    . Hyperlipidemia 01/11/2015  . GAD (generalized anxiety disorder) 01/11/2015  . Depression 01/11/2015  . GERD (  gastroesophageal reflux disease) 01/11/2015  . Osteopenia 07/13/2014  . Obesity (BMI 30-39.9) 06/19/2014  . Carotid stenosis   . Anemia of chronic disease 12/25/2011  . Iron deficiency anemia 05/27/2011  . Diastolic CHF, chronic ([place]) 04/02/2010  . Type 2 diabetes mellitus with insulin therapy ([place]) 10/08/2009  . DYSLIPIDEMIA 10/08/2009  . Essential hypertension 10/08/2009  . Coronary atherosclerosis 10/08/2009  . CAROTID STENOSIS 10/08/2009    Wynelle Fanny, PTA 10/22/2016, 12:13 PM  [facility] [address] [place], [place], [address] Phone: [phone]   Fax:  [phone]  Name: KIMBLE DELAURENTIS MRN: [id number] Date of Birth: 08-09-1931

## 2016-10-26 ENCOUNTER — Ambulatory Visit (INDEPENDENT_AMBULATORY_CARE_PROVIDER_SITE_OTHER): Payer: Medicare Other

## 2016-10-26 ENCOUNTER — Encounter: Payer: Self-pay | Admitting: Nurse Practitioner

## 2016-10-26 ENCOUNTER — Ambulatory Visit (INDEPENDENT_AMBULATORY_CARE_PROVIDER_SITE_OTHER): Payer: Medicare Other | Admitting: Nurse Practitioner

## 2016-10-26 VITALS — BP 148/80 | HR 56 | Temp 97.3°F

## 2016-10-26 DIAGNOSIS — W19XXXA Unspecified fall, initial encounter: Secondary | ICD-10-CM

## 2016-10-26 DIAGNOSIS — M791 Myalgia: Secondary | ICD-10-CM | POA: Diagnosis not present

## 2016-10-26 DIAGNOSIS — W01198A Fall on same level from slipping, tripping and stumbling with subsequent striking against other object, initial encounter: Secondary | ICD-10-CM | POA: Diagnosis not present

## 2016-10-26 DIAGNOSIS — M7918 Myalgia, other site: Secondary | ICD-10-CM

## 2016-10-26 DIAGNOSIS — S7001XA Contusion of right hip, initial encounter: Secondary | ICD-10-CM | POA: Diagnosis not present

## 2016-10-26 DIAGNOSIS — R0782 Intercostal pain: Secondary | ICD-10-CM | POA: Diagnosis not present

## 2016-10-26 DIAGNOSIS — S20211A Contusion of right front wall of thorax, initial encounter: Secondary | ICD-10-CM

## 2016-10-26 DIAGNOSIS — M25551 Pain in right hip: Secondary | ICD-10-CM

## 2016-10-26 NOTE — Progress Notes (Signed)
   Subjective:    [name], female    DOB: 1931/06/29, 82 y.o.   MRN: [id number]  HPI Patient is brought in today by her daughter complaining that patient fell when bending over to pick something up out of the floor and she just fell over nad landed on her butt. But she is c/o hurting along right rib cage. Denies hitting ribs on anything. This happened yesterday afternoon. Took some advil yesterday evening which helped some.    Review of Systems  Constitutional: Negative.   HENT: Negative.   Respiratory: Negative.   Cardiovascular: Negative.   Gastrointestinal: Negative.   Genitourinary: Negative.   Neurological: Negative.   Psychiatric/Behavioral: Negative.   All other systems reviewed and are negative.      Objective:   Physical Exam  Constitutional: She is oriented to person, place, and time. She appears well-developed and well-nourished. She appears distressed (only when moving.).  Cardiovascular: Normal rate, regular rhythm and normal heart sounds.   Pulmonary/Chest: Effort normal and breath sounds normal.  Abdominal: Soft. Bowel sounds are normal.  Neurological: She is alert and oriented to person, place, and time.  Skin: Skin is warm.  No echymosis  Psychiatric: She has a normal mood and affect. Her behavior is normal. Judgment and thought content normal.   BP (!) 148/80 (BP Location: Left Arm, Cuff Size: Normal)   Pulse (!) 56   Temp 97.3 F (36.3 C) (Oral)   Chest xary- no rib fracture noted-Preliminary reading by Ronnald Collum, FNP  [facility] Right hip xray- no hip or pelvic fracture noted-Preliminary reading by Ronnald Collum, FNP  [facility]        Assessment & Plan:   1. Fall, initial encounter   2. Contusion of rib on right side, initial encounter   3. Contusion of right hip, initial encounter   4. Acute buttock pain    Moist heat Rest Pain meds that already has Fall precautions Follow up prn  Mary-Margaret Hassell Done, FNP

## 2016-10-26 NOTE — Patient Instructions (Signed)
Contusion Introduction A contusion is a deep bruise. Contusions happen when an injury causes bleeding under the skin. Symptoms of bruising include pain, swelling, and discolored skin. The skin may turn blue, purple, or yellow. Follow these instructions at home:  Rest the injured area.  If told, put ice on the injured area.  Put ice in a plastic bag.  Place a towel between your skin and the bag.  Leave the ice on for 20 minutes, 2-3 times per day.  If told, put light pressure (compression) on the injured area using an elastic bandage. Make sure the bandage is not too tight. Remove it and put it back on as told by your doctor.  If possible, raise (elevate) the injured area above the level of your heart while you are sitting or lying down.  Take over-the-counter and prescription medicines only as told by your doctor. Contact a doctor if:  Your symptoms do not get better after several days of treatment.  Your symptoms get worse.  You have trouble moving the injured area. Get help right away if:  You have very bad pain.  You have a loss of feeling (numbness) in a hand or foot.  Your hand or foot turns pale or cold. This information is not intended to replace advice given to you by your health care provider. Make sure you discuss any questions you have with your health care provider. Document Released: 05/11/2008 Document Revised: 04/30/2016 Document Reviewed: 04/10/2015  2017 Elsevier

## 2016-10-27 ENCOUNTER — Ambulatory Visit: Payer: Medicare Other | Admitting: Physical Therapy

## 2016-10-30 ENCOUNTER — Other Ambulatory Visit: Payer: Self-pay | Admitting: *Deleted

## 2016-11-02 ENCOUNTER — Other Ambulatory Visit: Payer: Self-pay | Admitting: Family Medicine

## 2016-11-03 ENCOUNTER — Encounter: Payer: Self-pay | Admitting: Physical Therapy

## 2016-11-03 ENCOUNTER — Ambulatory Visit: Payer: Medicare Other | Admitting: Physical Therapy

## 2016-11-03 DIAGNOSIS — R260 Ataxic gait: Secondary | ICD-10-CM

## 2016-11-03 DIAGNOSIS — R293 Abnormal posture: Secondary | ICD-10-CM | POA: Diagnosis not present

## 2016-11-03 DIAGNOSIS — L84 Corns and callosities: Secondary | ICD-10-CM | POA: Diagnosis not present

## 2016-11-03 DIAGNOSIS — B351 Tinea unguium: Secondary | ICD-10-CM | POA: Diagnosis not present

## 2016-11-03 DIAGNOSIS — R2689 Other abnormalities of gait and mobility: Secondary | ICD-10-CM | POA: Diagnosis not present

## 2016-11-03 DIAGNOSIS — M6281 Muscle weakness (generalized): Secondary | ICD-10-CM | POA: Diagnosis not present

## 2016-11-03 DIAGNOSIS — E1142 Type 2 diabetes mellitus with diabetic polyneuropathy: Secondary | ICD-10-CM | POA: Diagnosis not present

## 2016-11-03 NOTE — Therapy (Signed)
[name], [place], [address] Phone: [phone]   Fax:  [phone]  Physical Therapy Treatment  Patient Details  Name: [name] MRN: [id number] Date of Birth: 11-09-1943 Referring Provider: Alain Honey MD.  Encounter Date: 03-Jul-202017      PT End of Session - 11/03/16 1055    Visit Number 14   Number of Visits 16   Date for PT Re-Evaluation 11/08/16   Authorization Type KX     PT Start Time 1104   PT Stop Time 1148   PT Time Calculation (min) 44 min   Activity Tolerance Patient tolerated treatment well   Behavior During Therapy [facility] for tasks assessed/performed      Past Medical History:  Diagnosis Date  . Anemia   . Anxiety   . Arthritis    back, knees, and hips  . Asthma    with allergies  . Balance problems   . CAD (coronary artery disease)   . Carotid stenosis   . CHF (congestive heart failure) ([place])    EF preserved Echo 2012  . Chronic kidney infection   . 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 Dementia    "a little"  . Depression   . Diabetes mellitus    type 2  . Furuncle of back, except buttock   . GERD (gastroesophageal reflux disease)   . H/O seasonal allergies   . Hearing loss   . Hypertension   . Iron deficiency anemia 05/27/2011  . Myocardial infarction   . Pneumonia    hx of  . Renal insufficiency   . Shortness of breath dyspnea    with activity  . Speech problem    from stroke  . Swallowing difficulty   . Vertigo    "when sugar gets low"  . Vision loss    left eye-"half blind"    Past Surgical History:  Procedure Laterality Date  . APPENDECTOMY     with hysterectomy  . CATARACT EXTRACTION Bilateral 5 years ago  . CHOLECYSTECTOMY    . CORONARY ANGIOPLASTY     prior to 2006 5 stents  . CORONARY ARTERY BYPASS GRAFT  2006  . I & D of Furuncle  April 2013  . KNEE  SURGERY Right   . TOTAL ABDOMINAL HYSTERECTOMY  ~82 years old   complete, with tumor removal  . TOTAL KNEE ARTHROPLASTY Right 08/13/2015   Procedure: RIGHT  TOTAL KNEE ARTHROPLASTY;  Surgeon: Paralee Cancel, MD;  [facility];  Service: Orthopedics;  Laterality: Right;    There were no vitals filed for this visit.      Subjective Assessment - 11/03/16 1054    Subjective Patient reports falling earlier this morning towards her left side. Daughter reports that she missed last week and got weak. Daughter also reports that she thinks patient needs a workout to work soreness out. Patient reports that daughter thinks she is putting on to get help. Patient reports feeling nervous all the time.   Patient is accompained by: Family member  Daughter   Limitations Walking   Patient Stated Goals Walk better.   Currently in Pain? Other (Comment)  Reported low back pain and overall soreness from fall this morning but gave no rating             [facility] PT Assessment - 11/03/16 0001      Assessment  Medical Diagnosis Unsteady gait.     Precautions   Precautions Fall     Restrictions   Weight Bearing Restrictions No                     [facility] Adult PT Treatment/Exercise - 11/03/16 0001      Knee/Hip Exercises: Aerobic   Nustep L6  x 20  min UE/LE monitored for progresion seat 10, staying around 60 steps per minute today and      Knee/Hip Exercises: Standing   Hip Flexion AROM;Both;2 sets;10 reps;Knee bent   Hip Abduction AROM;Both;2 sets;10 reps;Knee straight     Knee/Hip Exercises: Seated   Long Arc Quad Strengthening;Both;3 sets;10 reps;Weights   Long Arc Quad Weight 4 lbs.   Clamshell with TheraBand Green  3x10 reps   Hamstring Curl Strengthening;Both;3 sets;10 reps   Hamstring Limitations Green theraband   Sit to Sand 2 sets;10 reps;with UE support                  PT Short Term Goals - 10/13/16 1140      PT SHORT TERM GOAL #1   Title Berg score to  30/56.   Time 4   Period Weeks   Status Achieved           PT Long Term Goals - 10/13/16 1141      PT LONG TERM GOAL #1   Title Ind with an advanced HEP.   Time 8   Period Weeks   Status On-going     PT LONG TERM GOAL #2   Title Left hip strength to a solid 4+/5.   Time 8   Period Weeks   Status On-going     PT LONG TERM GOAL #3   Title Patient walk in clinic 500 feet with a FWW in an upright posture.   Time 8   Period Weeks   Status On-going               Plan - 11/03/16 1152    Clinical Impression Statement Patient arrived to treatment today with reports of two recent falls. One fall was around two weeks ago and one was this morning early. Patient arrived with reports of soreness from her falls and moved at much slower pace today. Patient required extra time and increased multimodal cueing for exercise technique and corrections. Patient also reported experiencing fatigue as she states she did not sleep well last night. Patient experienced fatigue with sit to stands today as well. Patient was educated to keep AD closer to her body during ambulation to prevent falls and to also remember to maintain foot clearance to avoid tripping as well. Patient experienced soreness following today's treatment as well per patient report.   PT Frequency 2x / week   PT Duration 8 weeks   PT Treatment/Interventions ADLs/Self Care Home Management;Gait training;Functional mobility training;Therapeutic activities;Therapeutic exercise;Balance training;Neuromuscular re-education;Patient/family education   PT Next Visit Plan Continue LE strengthening as well gait and balance training per MPT POC.   Consulted and Agree with Plan of Care Patient      Patient will benefit from skilled therapeutic intervention in order to improve the following deficits and impairments:  Decreased balance, Decreased strength  Visit Diagnosis: Ataxic gait  Other abnormalities of gait and mobility  Muscle  weakness (generalized)  Abnormal posture     Problem List Patient Active Problem List   Diagnosis Date Noted  . Overweight (BMI 25.0-29.9) 05/08/2016  . UTI (lower urinary  tract infection) 11/12/2015  . DM hyperosmolarity type II, uncontrolled ([place]) 11/12/2015  . Dementia   . Encephalopathy, metabolic 97/67/3419  . S/P right TKA 09/06/201616  . S/P knee replacement 08/13/2015  . Elevated troponin 03/06/2015  . Fever   . History of diabetes mellitus   . Left buttock pain   . Hyperlipidemia 01/11/2015  . GAD (generalized anxiety disorder) 01/11/2015  . Depression 01/11/2015  . GERD (gastroesophageal reflux disease) 01/11/2015  . Osteopenia 07/13/2014  . Obesity (BMI 30-39.9) 06/19/2014  . Carotid stenosis   . Anemia of chronic disease 12/25/2011  . Iron deficiency anemia 05/27/2011  . Diastolic CHF, chronic ([place]) 04/02/2010  . Type 2 diabetes mellitus with insulin therapy ([place]) 10/08/2009  . DYSLIPIDEMIA 10/08/2009  . Essential hypertension 10/08/2009  . Coronary atherosclerosis 10/08/2009  . CAROTID STENOSIS 10/08/2009    Wynelle Fanny, PTA 2020-07-416, 11:57 AM  [facility] [address] [place], [place], [address] Phone: [phone]   Fax:  [phone]  Name: JILLIAN WARTH MRN: [id number] Date of Birth: Sep 20, 1931

## 2016-11-04 MED ORDER — LORAZEPAM 0.5 MG PO TABS: ORAL_TABLET | ORAL | 1 refills | Status: DC

## 2016-11-05 ENCOUNTER — Ambulatory Visit: Payer: Medicare Other | Admitting: Physical Therapy

## 2016-11-05 ENCOUNTER — Encounter: Payer: Self-pay | Admitting: Physical Therapy

## 2016-11-05 DIAGNOSIS — R260 Ataxic gait: Secondary | ICD-10-CM

## 2016-11-05 DIAGNOSIS — M6281 Muscle weakness (generalized): Secondary | ICD-10-CM | POA: Diagnosis not present

## 2016-11-05 DIAGNOSIS — R2689 Other abnormalities of gait and mobility: Secondary | ICD-10-CM | POA: Diagnosis not present

## 2016-11-05 DIAGNOSIS — R293 Abnormal posture: Secondary | ICD-10-CM | POA: Diagnosis not present

## 2016-11-05 NOTE — Therapy (Signed)
[facility] [place], [place], [address] Phone: [phone]   Fax:  [phone]  Physical Therapy Treatment  Patient Details  Name: [name] MRN: [id number] Date of Birth: 03-Jan-1931 Referring Provider: Alain Honey MD.  Encounter Date: 11/05/2016      PT End of Session - 11/05/16 1139    Visit Number 15   Number of Visits 16   Date for PT Re-Evaluation 11/08/16   Authorization Type KX     PT Start Time 1116   PT Stop Time 1159   PT Time Calculation (min) 43 min   Activity Tolerance Patient tolerated treatment well   Behavior During Therapy [facility] for tasks assessed/performed      Past Medical History:  Diagnosis Date  . Anemia   . Anxiety   . Arthritis    back, knees, and hips  . Asthma    with allergies  . Balance problems   . CAD (coronary artery disease)   . Carotid stenosis   . CHF (congestive heart failure) ([place])    EF preserved Echo 2012  . Chronic kidney infection   . 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 Dementia    "a little"  . Depression   . Diabetes mellitus    type 2  . Furuncle of back, except buttock   . GERD (gastroesophageal reflux disease)   . H/O seasonal allergies   . Hearing loss   . Hypertension   . Iron deficiency anemia 05/27/2011  . Myocardial infarction   . Pneumonia    hx of  . Renal insufficiency   . Shortness of breath dyspnea    with activity  . Speech problem    from stroke  . Swallowing difficulty   . Vertigo    "when sugar gets low"  . Vision loss    left eye-"half blind"    Past Surgical History:  Procedure Laterality Date  . APPENDECTOMY     with hysterectomy  . CATARACT EXTRACTION Bilateral 5 years ago  . CHOLECYSTECTOMY    . CORONARY ANGIOPLASTY     prior to 2006 5 stents  . CORONARY ARTERY BYPASS GRAFT  2006  . I & D of Furuncle  April 2013  . KNEE  SURGERY Right   . TOTAL ABDOMINAL HYSTERECTOMY  ~82 years old   complete, with tumor removal  . TOTAL KNEE ARTHROPLASTY Right 08/13/2015   Procedure: RIGHT  TOTAL KNEE ARTHROPLASTY;  Surgeon: Paralee Cancel, MD;  [facility];  Service: Orthopedics;  Laterality: Right;    There were no vitals filed for this visit.      Subjective Assessment - 11/05/16 1126    Subjective Patient feeling some ongoing soreness in hip area. no falls reported   Patient is accompained by: Family member   Limitations Walking   Patient Stated Goals Walk better.   Currently in Pain? No/denies  no rating given            [facility] PT Assessment - 11/05/16 0001      Strength   Right/Left Hip Left   Right Hip ABduction 4/5                     [facility] Adult PT Treatment/Exercise - 11/05/16 0001      Knee/Hip Exercises: Aerobic   Nustep L3 x37min UE/LE activity     Knee/Hip Exercises: Standing  Hip Flexion AROM;Both;2 sets;10 reps;Knee bent   Hip Abduction AROM;Both;2 sets;10 reps;Knee straight   Other Standing Knee Exercises (P)  seated scap retractions 2x10 with tactle cues     Knee/Hip Exercises: Seated   Long Arc Quad Strengthening;Both;3 sets;10 reps;Weights   Long Arc Quad Weight 4 lbs.   Clamshell with TheraBand Green  3x10   Hamstring Curl Strengthening;Both;3 sets;10 reps   Hamstring Limitations Green theraband   Sit to Sand (P)  10 reps;with UE support                  PT Short Term Goals - 10/13/16 1140      PT SHORT TERM GOAL #1   Title Berg score to 30/56.   Time 4   Period Weeks   Status Achieved           PT Long Term Goals - 11/05/16 1140      PT LONG TERM GOAL #1   Title Ind with an advanced HEP.   Time 8   Period Weeks   Status On-going     PT LONG TERM GOAL #2   Title Left hip strength to a solid 4+/5.   Time 8   Period Weeks   Status On-going  4/5 left hip strength 11/05/16     PT LONG TERM GOAL #3   Title Patient walk in clinic  500 feet with a FWW in an upright posture.   Time 8   Period Weeks   Status On-going               Plan - 11/05/16 1145    Clinical Impression Statement Patient progressing and has reported no falls recently. Patient has improved with left hip strength and has ongoing soreness when laying on left side. Patient continues to use rollator with slight forward flexed posture. Goals ongoing due to strength and posture deficts.   PT Frequency 2x / week   PT Duration 8 weeks   PT Treatment/Interventions ADLs/Self Care Home Management;Gait training;Functional mobility training;Therapeutic activities;Therapeutic exercise;Balance training;Neuromuscular re-education;Patient/family education   PT Next Visit Plan Continue LE strengthening as well gait and balance training per MPT POC.   Consulted and Agree with Plan of Care Patient      Patient will benefit from skilled therapeutic intervention in order to improve the following deficits and impairments:  Decreased balance, Decreased strength  Visit Diagnosis: Ataxic gait  Other abnormalities of gait and mobility  Muscle weakness (generalized)  Abnormal posture     Problem List Patient Active Problem List   Diagnosis Date Noted  . Overweight (BMI 25.0-29.9) 05/08/2016  . UTI (lower urinary tract infection) 11/12/2015  . DM hyperosmolarity type II, uncontrolled ([place]) 11/12/2015  . Dementia   . Encephalopathy, metabolic 41/74/0814  . S/P right TKA 08/13/2015  . S/P knee replacement 08/13/2015  . Elevated troponin 03/06/2015  . Fever   . History of diabetes mellitus   . Left buttock pain   . Hyperlipidemia 01/11/2015  . GAD (generalized anxiety disorder) 01/11/2015  . Depression 01/11/2015  . GERD (gastroesophageal reflux disease) 01/11/2015  . Osteopenia 07/13/2014  . Obesity (BMI 30-39.9) 06/19/2014  . Carotid stenosis   . Anemia of chronic disease 12/25/2011  . Iron deficiency anemia 05/27/2011  . Diastolic CHF, chronic  ([place]) 04/02/2010  . Type 2 diabetes mellitus with insulin therapy ([place]) 10/08/2009  . DYSLIPIDEMIA 10/08/2009  . Essential hypertension 10/08/2009  . Coronary atherosclerosis 10/08/2009  . CAROTID STENOSIS 10/08/2009    Harue Pribble,  Dolorez Jeffrey P, PTA 11/05/2016, 11:59 AM  [facility] [address] [place], [place], [address] Phone: [phone]   Fax:  [phone]  Name: [name] MRN: [id number] Date of Birth: 13-Nov-1931

## 2016-11-09 ENCOUNTER — Encounter: Payer: Self-pay | Admitting: Family Medicine

## 2016-11-09 ENCOUNTER — Ambulatory Visit (INDEPENDENT_AMBULATORY_CARE_PROVIDER_SITE_OTHER): Payer: Medicare Other | Admitting: Family Medicine

## 2016-11-09 ENCOUNTER — Telehealth: Payer: Self-pay | Admitting: *Deleted

## 2016-11-09 VITALS — BP 167/76 | HR 66 | Temp 97.9°F | Ht 66.0 in | Wt 156.4 lb

## 2016-11-09 DIAGNOSIS — N3001 Acute cystitis with hematuria: Secondary | ICD-10-CM

## 2016-11-09 DIAGNOSIS — R8290 Unspecified abnormal findings in urine: Secondary | ICD-10-CM | POA: Diagnosis not present

## 2016-11-09 LAB — URINALYSIS
Bilirubin, UA: NEGATIVE
NITRITE UA: NEGATIVE
Specific Gravity, UA: 1.02 (ref 1.005–1.030)
UUROB: 1 mg/dL (ref 0.2–1.0)
pH, UA: 5.5 (ref 5.0–7.5)

## 2016-11-09 MED ORDER — CIPROFLOXACIN HCL 250 MG PO TABS: 250.0000 mg | ORAL_TABLET | Freq: Two times a day (BID) | ORAL | 0 refills | Status: DC

## 2016-11-09 NOTE — Patient Instructions (Signed)
Great to meet you!   Urinary Tract Infection, Adult A urinary tract infection (UTI) is an infection of any part of the urinary tract, which includes the kidneys, ureters, bladder, and urethra. These organs make, store, and get rid of urine in the body. UTI can be a bladder infection (cystitis) or kidney infection (pyelonephritis). What are the causes? This infection may be caused by fungi, viruses, or bacteria. Bacteria are the most common cause of UTIs. This condition can also be caused by repeated incomplete emptying of the bladder during urination. What increases the risk? This condition is more likely to develop if:  You ignore your need to urinate or hold urine for long periods of time.  You do not empty your bladder completely during urination.  You wipe back to front after urinating or having a bowel movement, if you are female.  You are uncircumcised, if you are female.  You are constipated.  You have a urinary catheter that stays in place (indwelling).  You have a weak defense (immune) system.  You have a medical condition that affects your bowels, kidneys, or bladder.  You have diabetes.  You take antibiotic medicines frequently or for long periods of time, and the antibiotics no longer work well against certain types of infections (antibiotic resistance).  You take medicines that irritate your urinary tract.  You are exposed to chemicals that irritate your urinary tract.  You are female. What are the signs or symptoms? Symptoms of this condition include:  Fever.  Frequent urination or passing small amounts of urine frequently.  Needing to urinate urgently.  Pain or burning with urination.  Urine that smells bad or unusual.  Cloudy urine.  Pain in the lower abdomen or back.  Trouble urinating.  Blood in the urine.  Vomiting or being less hungry than normal.  Diarrhea or abdominal pain.  Vaginal discharge, if you are female. How is this  diagnosed? This condition is diagnosed with a medical history and physical exam. You will also need to provide a urine sample to test your urine. Other tests may be done, including:  Blood tests.  Sexually transmitted disease (STD) testing. If you have had more than one UTI, a cystoscopy or imaging studies may be done to determine the cause of the infections. How is this treated? Treatment for this condition often includes a combination of two or more of the following:  Antibiotic medicine.  Other medicines to treat less common causes of UTI.  Over-the-counter medicines to treat pain.  Drinking enough water to stay hydrated. Follow these instructions at home:  Take over-the-counter and prescription medicines only as told by your health care provider.  If you were prescribed an antibiotic, take it as told by your health care provider. Do not stop taking the antibiotic even if you start to feel better.  Avoid alcohol, caffeine, tea, and carbonated beverages. They can irritate your bladder.  Drink enough fluid to keep your urine clear or pale yellow.  Keep all follow-up visits as told by your health care provider. This is important.  Make sure to:  Empty your bladder often and completely. Do not hold urine for long periods of time.  Empty your bladder before and after sex.  Wipe from front to back after a bowel movement if you are female. Use each tissue one time when you wipe. Contact a health care provider if:  You have back pain.  You have a fever.  You feel nauseous or vomit.  Your symptoms  do not get better after 3 days.  Your symptoms go away and then return. Get help right away if:  You have severe back pain or lower abdominal pain.  You are vomiting and cannot keep down any medicines or water. This information is not intended to replace advice given to you by your health care provider. Make sure you discuss any questions you have with your health care  provider. Document Released: 09/02/2005 Document Revised: 05/06/2016 Document Reviewed: 10/14/2015 Elsevier Interactive Patient Education  2017 [employer].

## 2016-11-09 NOTE — Progress Notes (Signed)
   HPI  Patient presents today here with her daughter with concern for UTI.  They complain of foul smelling urine, bilateral lower abdominal pain, and lower back pain for about 2 days.  She denies any fevers or chills. She has had more sweats unusual.  She's tolerating foods and fluids normally.  She also complains of some constipation.  PMH: Smoking status noted ROS: Per HPI  Objective: BP (!) 167/76   Pulse 66   Temp 97.9 F (36.6 C) (Oral)   Ht 5\' 6"  (1.676 m)   Wt 156 lb 6.4 oz (70.9 kg)   BMI 25.24 kg/m  Gen: NAD, alert, cooperative with exam HEENT: NCAT CV: RRR, good S1/S2, no murmur Resp: CTABL, no wheezes, non-labored Abd: Soft, positive bowel sounds, left lower quadrant tenderness to palpation that is mild-to-moderate in degree Ext: No edema, warm Neuro: Alert and oriented, No gross deficits  Assessment and plan:  # UTI No signs of urosepsis Treat with cipro X 7 days for complicated UTI Culture RTC with any concerns    Orders Placed This Encounter  Procedures  . Urine culture  . Urinalysis    Meds ordered this encounter  Medications  . ciprofloxacin (CIPRO) 250 MG tablet    Sig: Take 1 tablet (250 mg total) by mouth 2 (two) times daily.    Dispense:  14 tablet    Refill:  0    Laroy Apple, MD [facility] 11/09/2016, 3:02 PM

## 2016-11-10 ENCOUNTER — Ambulatory Visit: Payer: Medicare Other | Admitting: Physical Therapy

## 2016-11-10 ENCOUNTER — Telehealth: Payer: Self-pay | Admitting: Family

## 2016-11-10 NOTE — Telephone Encounter (Signed)
Pt notified Rx was sent to [facility]

## 2016-11-10 NOTE — Telephone Encounter (Signed)
Message left, lorazepam refill called to [facility] voice mail.

## 2016-11-11 LAB — URINE CULTURE

## 2016-11-12 ENCOUNTER — Encounter: Payer: Self-pay | Admitting: Physical Therapy

## 2016-11-12 ENCOUNTER — Ambulatory Visit: Payer: Medicare Other | Attending: Family Medicine | Admitting: Physical Therapy

## 2016-11-12 DIAGNOSIS — M6281 Muscle weakness (generalized): Secondary | ICD-10-CM

## 2016-11-12 DIAGNOSIS — R2689 Other abnormalities of gait and mobility: Secondary | ICD-10-CM | POA: Diagnosis not present

## 2016-11-12 DIAGNOSIS — R260 Ataxic gait: Secondary | ICD-10-CM | POA: Insufficient documentation

## 2016-11-12 DIAGNOSIS — R293 Abnormal posture: Secondary | ICD-10-CM | POA: Diagnosis not present

## 2016-11-12 NOTE — Therapy (Signed)
[facility] [address] [place], [place], [address] Phone: [phone]   Fax:  [phone]  Physical Therapy Treatment  Patient Details  Name: [name] MRN: [id number] Date of Birth: 08/26/1931 Referring Provider: Stephen Miller MD.  Encounter Date: 11/12/2016    Past Medical History:  Diagnosis Date  . Anemia   . Anxiety   . Arthritis    back, knees, and hips  . Asthma    with allergies  . Balance problems   . CAD (coronary artery disease)   . Carotid stenosis   . CHF (congestive heart failure) (HCC)    EF preserved Echo 2012  . Chronic kidney infection   . 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 Dementia    "a little"  . Depression   . Diabetes mellitus    type 2  . Furuncle of back, except buttock   . GERD (gastroesophageal reflux disease)   . H/O seasonal allergies   . Hearing loss   . Hypertension   . Iron deficiency anemia 05/27/2011  . Myocardial infarction   . Pneumonia    hx of  . Renal insufficiency   . Shortness of breath dyspnea    with activity  . Speech problem    from stroke  . Swallowing difficulty   . Vertigo    "when sugar gets low"  . Vision loss    left eye-"half blind"    Past Surgical History:  Procedure Laterality Date  . APPENDECTOMY     with hysterectomy  . CATARACT EXTRACTION Bilateral 5 years ago  . CHOLECYSTECTOMY    . CORONARY ANGIOPLASTY     prior to 2006 5 stents  . CORONARY ARTERY BYPASS GRAFT  2006  . I & D of Furuncle  April 2013  . KNEE SURGERY Right   . TOTAL ABDOMINAL HYSTERECTOMY  ~82 years old   complete, with tumor removal  . TOTAL KNEE ARTHROPLASTY Right 08/13/2015   Procedure: RIGHT  TOTAL KNEE ARTHROPLASTY;  Surgeon: Matthew Olin, MD;  Location: [facility];  Service: Orthopedics;  Laterality: Right;    There were no vitals filed for this  visit.                                 PT Short Term Goals - 10/13/16 1140      PT SHORT TERM GOAL #1   Title Berg score to 30/56.   Time 4   Period Weeks   Status Achieved           PT Long Term Goals - 11/12/16 1218      PT LONG TERM GOAL #1   Title Ind with an advanced HEP.   Time 8   Period Weeks   Status Unable to assess     PT LONG TERM GOAL #2   Title Left hip strength to a solid 4+/5.   Time 8   Period Weeks   Status Not Met  B hip strength measured as 4-/5 - 4/5 11/12/2016     PT LONG TERM GOAL #3   Title Patient walk in clinic 500 feet with a FWW in an upright posture.   Time 8   Period Weeks   Status Partially Met  Limited with ambulation secondary to on-going B low back pain               Patient will benefit from skilled therapeutic intervention in order to improve the following deficits and impairments:  Decreased balance, Decreased strength  Visit Diagnosis: Ataxic gait  Other abnormalities of gait and mobility  Muscle weakness (generalized)  Abnormal posture       G-Codes - 11/24/16 1807    Functional Assessment Tool Used Clinical judgement....D/c.   Functional Limitation Mobility: Walking and moving around   Mobility: Walking and Moving Around Current Status (G8978) At least 60 percent but less than 80 percent impaired, limited or restricted   Mobility: Walking and Moving Around Goal Status (G8979) At least 20 percent but less than 40 percent impaired, limited or restricted   Mobility: Walking and Moving Around Discharge Status (G8980) At least 60 percent but less than 80 percent impaired, limited or restricted      Problem List Patient Active Problem List   Diagnosis Date Noted  . Overweight (BMI 25.0-29.9) 05/08/2016  . UTI (lower urinary tract infection) 11/12/2015  . DM hyperosmolarity type II, uncontrolled (HCC) 11/12/2015  . Dementia   . Encephalopathy, metabolic 09/17/2015  . S/P right TKA  08/13/2015  . S/P knee replacement 08/13/2015  . Elevated troponin 03/06/2015  . Fever   . History of diabetes mellitus   . Left buttock pain   . Hyperlipidemia 01/11/2015  . GAD (generalized anxiety disorder) 01/11/2015  . Depression 01/11/2015  . GERD (gastroesophageal reflux disease) 01/11/2015  . Osteopenia 07/13/2014  . Obesity (BMI 30-39.9) 06/19/2014  . Carotid stenosis   . Anemia of chronic disease 12/25/2011  . Iron deficiency anemia 05/27/2011  . Diastolic CHF, chronic (HCC) 04/02/2010  . Type 2 diabetes mellitus with insulin therapy (HCC) 10/08/2009  . DYSLIPIDEMIA 10/08/2009  . Essential hypertension 10/08/2009  . Coronary atherosclerosis 10/08/2009  . CAROTID STENOSIS 10/08/2009    Kelsey Parsons, PTA 11/24/16 6:09 PM  [facility] [address] [place], [place], [address] Phone: [phone]   Fax:  [phone]  Name: [name] MRN: [id number] Date of Birth: 07/14/1931  PHYSICAL THERAPY DISCHARGE SUMMARY  Visits from Start of Care: 16.  Current functional level related to goals / functional outcomes: See above.   Remaining deficits: Continued weakness and decreased distance of ambulation.   Education / Equipment: HEP. Plan: Patient agrees to discharge.  Patient goals were not met. Patient is being discharged due to lack of progress.  ?????         Chad Applegate MPT 

## 2016-11-17 ENCOUNTER — Ambulatory Visit: Payer: Medicare Other | Admitting: Physical Therapy

## 2016-11-17 NOTE — Progress Notes (Signed)
HPI The patient presents for one-year followup and preoperative evaluation. I saw her last year prior to a left knee replacement.  She had a [employer] which was low risk.  She had her knee replacement and returns for follow up.  She's lost about 40 pounds in the last couple of years. She apparently after her knee replacement and very depressed and stopped eating. She apparently is over this period he gets around in her house. She is limited by back pains. She has lots of chronic constipation and abdominal complaints. She denies any new cardiovascular complaints.  The patient denies any new symptoms such as chest discomfort, neck or arm discomfort. There has been no new shortness of breath, PND or orthopnea. There have been no reported palpitations, presyncope or syncope.  Allergies  Allergen Reactions  . Advil [Ibuprofen] Swelling  . Heparin Other (See Comments)    Confusion  . Propoxyphene N-Acetaminophen Other (See Comments)    Numbness all over. "floating" sensation.    Current Outpatient Prescriptions  Medication Sig Dispense Refill  . amLODipine (NORVASC) 10 MG tablet Take 1 tablet (10 mg total) by mouth daily. 30 tablet 6  . Artificial Tear Ointment (DRY EYES OP) Apply 1 drop to eye daily as needed. For dry eye/ red eye     . atorvastatin (LIPITOR) 20 MG tablet TAKE (1) TABLET DAILY AT BEDTIME. 30 tablet 2  . celecoxib (CELEBREX) 200 MG capsule Take 1 capsule (200 mg total) by mouth daily. With food 30 capsule 5  . clopidogrel (PLAVIX) 75 MG tablet TAKE 1 TABLET ONCE DAILY. 30 tablet 5  . docusate sodium (COLACE) 100 MG capsule Take 1 capsule (100 mg total) by mouth 2 (two) times daily. 10 capsule 0  . EASY TOUCH INSULIN SYRINGE 31G X 5/16" 1 ML MISC USE AS DIRECTED. 80 each 2  . EASY TOUCH PEN NEEDLES 31G X 8 MM MISC AS DIRECTED 100 each 2  . esomeprazole (NEXIUM) 40 MG capsule TAKE 1 CAPSULE BY MOUTH ONCE DAILY 30 capsule 5  . ferrous sulfate 325 (65 FE) MG tablet TAKE (1)  TABLET THREE TIMES DAILY AFTER MEALS. 90 tablet 2  . fluocinolone (VANOS) 0.01 % cream Apply topically 2 (two) times daily. 30 g 0  . Fluocinonide 0.1 % CREA     . furosemide (LASIX) 20 MG tablet Take 1 tablet (20 mg total) by mouth daily as needed for fluid or edema. 30 tablet 5  . HUMALOG KWIKPEN 100 UNIT/ML KiwkPen USE PER SLIDING SCALE NOTIFY MD OF RESULTS UNDER 60 OR OVER 300. 15 mL 2  . HUMULIN N 100 UNIT/ML injection Inject 28 Units into the skin every morning.    . Insulin Pen Needle 31G X 5 MM MISC Use with  Levemir injection 100 each 0  . isosorbide mononitrate (IMDUR) 30 MG 24 hr tablet TAKE (1) TABLET BY MOUTH ONCE DAILY. 30 tablet 5  . LEVEMIR FLEXTOUCH 100 UNIT/ML Pen INJECT 28 UNITS EVERY MORNING 15 mL 2  . linaclotide (LINZESS) 145 MCG CAPS capsule Take 1 capsule (145 mcg total) by mouth daily. To regulate bowel movements 30 capsule 5  . lisinopril (PRINIVIL,ZESTRIL) 20 MG tablet TAKE (1) TABLET BY MOUTH ONCE DAILY. 30 tablet 5  . LORazepam (ATIVAN) 0.5 MG tablet TAKE (1) TABLET TWICE DAILY as needed for anxiety 20 tablet 1  . meclizine (ANTIVERT) 25 MG tablet TAKE 1 TABLET TWICE DAILY AS NEEDED FOR DIZZINESS. (Patient taking differently: TAKE 1 TABLET TWICE DAILY FOR DIZZINESS.)  60 tablet 2  . metFORMIN (GLUCOPHAGE-XR) 500 MG 24 hr tablet TAKE 1 TABLET WITH BREAKFAST AND 1 TABLET WITH SUPPER. 60 tablet 5  . metoprolol (LOPRESSOR) 50 MG tablet TAKE (1) TABLET TWICE DAILY. 60 tablet 6  . Multiple Vitamins-Iron (DAILY VITAMINS/IRON/BETA CAROT PO) Take 1 tablet by mouth at bedtime.    . mupirocin ointment (BACTROBAN) 2 % Place 1 application into the nose 2 (two) times daily. 30 g 1  . nitroGLYCERIN (NITROSTAT) 0.4 MG SL tablet PLACE ONE (1) TABLET UNDER TONGUE EVERY 5 MINUTES UP TO (3) DOSES AS NEEDED FOR CHEST PAIN. 25 tablet 0  . PARoxetine (PAXIL) 20 MG tablet TAKE 1 TABLET ONCE DAILY. 30 tablet 2  . polyethylene glycol (MIRALAX / GLYCOLAX) packet Take 17 g by mouth 2 (two) times  daily. (Patient taking differently: Take 17 g by mouth daily as needed for mild constipation. ) 14 each 0  . traMADol (ULTRAM) 50 MG tablet 1/2 to 1 whole tab daily PRN 30 tablet 0  . lisinopril (PRINIVIL,ZESTRIL) 10 MG tablet Take 1 tablet (10 mg total) by mouth at bedtime. 90 tablet 3   No current facility-administered medications for this visit.     Past Medical History:  Diagnosis Date  . Anemia   . Anxiety   . Arthritis    back, knees, and hips  . Asthma    with allergies  . Balance problems   . CAD (coronary artery disease)   . Carotid stenosis   . CHF (congestive heart failure) ([place])    EF preserved Echo 2012  . Chronic kidney infection   . 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 Dementia    "a little"  . Depression   . Diabetes mellitus    type 2  . Furuncle of back, except buttock   . GERD (gastroesophageal reflux disease)   . H/O seasonal allergies   . Hearing loss   . Hypertension   . Iron deficiency anemia 05/27/2011  . Myocardial infarction   . Pneumonia    hx of  . Renal insufficiency   . Shortness of breath dyspnea    with activity  . Speech problem    from stroke  . Swallowing difficulty   . Vertigo    "when sugar gets low"  . Vision loss    left eye-"half blind"    Past Surgical History:  Procedure Laterality Date  . APPENDECTOMY     with hysterectomy  . CATARACT EXTRACTION Bilateral 5 years ago  . CHOLECYSTECTOMY    . CORONARY ANGIOPLASTY     prior to 2006 5 stents  . CORONARY ARTERY BYPASS GRAFT  2006  . I & D of Furuncle  April 2013  . KNEE SURGERY Right   . TOTAL ABDOMINAL HYSTERECTOMY  ~82 years old   complete, with tumor removal  . TOTAL KNEE ARTHROPLASTY Right 08/13/2015   Procedure: RIGHT  TOTAL KNEE ARTHROPLASTY;  Surgeon: Paralee Cancel, MD;  Location: [facility];  Service: Orthopedics;  Laterality: Right;    ROS:  Positive for significant back pain  radiating around to her abdomen, chronic constipation. Otherwise as stated in the HPI and negative for all other systems.  PHYSICAL EXAM BP (!) 164/71   Pulse 63   Ht 5\' 6"  (1.676 m)   Wt 156 lb (70.8 kg)   BMI 25.18 kg/m  GENERAL:  Well appearing HEENT:  Pupils equal  round and reactive, fundi not visualized, oral mucosa unremarkable, dentures NECK:  No jugular venous distention, waveform within normal limits, carotid upstroke brisk and symmetric, left bruits, no thyromegaly LYMPHATICS:  No cervical, inguinal adenopathy LUNGS:  Clear to auscultation bilaterally BACK:  No CVA tenderness CHEST:  Well healed sternotomy scar. HEART:  PMI not displaced or sustained,S1 and S2 within normal limits, no S3, no S4, no clicks, no rubs, 2 out of 6 apical nonradiating early peaking systolic murmur, no diastolic murmurs ABD:  Flat, positive bowel sounds normal in frequency in pitch, no bruits, no rebound, no guarding, no midline pulsatile mass, no hepatomegaly, no splenomegaly EXT:  2 plus pulses upper and diminished dorsalis pedis and posterior tibialis bilateral lower extremities, mild bilateral lower extremity ankle edema, no cyanosis no clubbing   EKG:  Sinus rhythm, rate 63, axis within normal limits, no acute ST-T wave changes 11/18/2016   Lab Results  Component Value Date   HGBA1C 11.2 (H) 11/13/2015    ASSESSMENT AND PLAN   CAD:  This will be evaluated as above. She will continue with risk reduction.  CAROTID STENOSIS:  This was mild in January and we will follow up as needed.  HTN:  The blood pressure is elevated.  I will add 10 mg of lisinopril at night and check a BMET in two weeks.    DM:  Her A1C is down from 11.2 to 8.2.

## 2016-11-18 ENCOUNTER — Encounter: Payer: Self-pay | Admitting: Cardiology

## 2016-11-18 ENCOUNTER — Ambulatory Visit (INDEPENDENT_AMBULATORY_CARE_PROVIDER_SITE_OTHER): Payer: Medicare Other | Admitting: Cardiology

## 2016-11-18 ENCOUNTER — Telehealth: Payer: Self-pay | Admitting: *Deleted

## 2016-11-18 VITALS — BP 164/71 | HR 63 | Ht 66.0 in | Wt 156.0 lb

## 2016-11-18 DIAGNOSIS — I2510 Atherosclerotic heart disease of native coronary artery without angina pectoris: Secondary | ICD-10-CM

## 2016-11-18 DIAGNOSIS — I10 Essential (primary) hypertension: Secondary | ICD-10-CM | POA: Diagnosis not present

## 2016-11-18 DIAGNOSIS — I6523 Occlusion and stenosis of bilateral carotid arteries: Secondary | ICD-10-CM

## 2016-11-18 MED ORDER — LISINOPRIL 10 MG PO TABS: 10.0000 mg | ORAL_TABLET | Freq: Every day | ORAL | 3 refills | Status: DC

## 2016-11-18 NOTE — Telephone Encounter (Signed)
Ativan refills called to [facility].

## 2016-11-18 NOTE — Patient Instructions (Signed)
Medication Instructions:  Please add Lisinopril 10 mg at bedtime. Continue all other medications as listed.  Labwork: Please have blood work in 2 weeks at [facility])  Follow-Up: Follow up in 1 year with Dr. Percival Spanish.  You will receive a letter in the mail 2 months before you are due.  Please call us when you receive this letter to schedule your follow up appointment.  If you need a refill on your cardiac medications before your next appointment, please call your pharmacy.  Thank you for choosing [facility]!!

## 2016-11-19 ENCOUNTER — Ambulatory Visit: Payer: Medicare Other | Admitting: Physical Therapy

## 2016-11-22 ENCOUNTER — Emergency Department (HOSPITAL_COMMUNITY): Payer: Medicare Other

## 2016-11-22 ENCOUNTER — Encounter (HOSPITAL_COMMUNITY): Payer: Self-pay | Admitting: Emergency Medicine

## 2016-11-22 ENCOUNTER — Emergency Department (HOSPITAL_COMMUNITY)
Admission: EM | Admit: 2016-11-22 | Discharge: 2016-11-22 | Disposition: A | Discharge status: Home / Self Care | Payer: Medicare Other | Attending: Emergency Medicine | Admitting: Emergency Medicine

## 2016-11-22 DIAGNOSIS — Z955 Presence of coronary angioplasty implant and graft: Secondary | ICD-10-CM | POA: Insufficient documentation

## 2016-11-22 DIAGNOSIS — I5032 Chronic diastolic (congestive) heart failure: Secondary | ICD-10-CM | POA: Diagnosis not present

## 2016-11-22 DIAGNOSIS — E1165 Type 2 diabetes mellitus with hyperglycemia: Secondary | ICD-10-CM | POA: Insufficient documentation

## 2016-11-22 DIAGNOSIS — W1839XA Other fall on same level, initial encounter: Secondary | ICD-10-CM | POA: Insufficient documentation

## 2016-11-22 DIAGNOSIS — N200 Calculus of kidney: Secondary | ICD-10-CM | POA: Diagnosis not present

## 2016-11-22 DIAGNOSIS — I25810 Atherosclerosis of coronary artery bypass graft(s) without angina pectoris: Secondary | ICD-10-CM | POA: Insufficient documentation

## 2016-11-22 DIAGNOSIS — I130 Hypertensive heart and chronic kidney disease with heart failure and stage 1 through stage 4 chronic kidney disease, or unspecified chronic kidney disease: Secondary | ICD-10-CM | POA: Diagnosis not present

## 2016-11-22 DIAGNOSIS — N1339 Other hydronephrosis: Secondary | ICD-10-CM | POA: Diagnosis not present

## 2016-11-22 DIAGNOSIS — S39012A Strain of muscle, fascia and tendon of lower back, initial encounter: Secondary | ICD-10-CM | POA: Diagnosis not present

## 2016-11-22 DIAGNOSIS — S3992XA Unspecified injury of lower back, initial encounter: Secondary | ICD-10-CM | POA: Diagnosis present

## 2016-11-22 DIAGNOSIS — N189 Chronic kidney disease, unspecified: Secondary | ICD-10-CM | POA: Insufficient documentation

## 2016-11-22 DIAGNOSIS — R739 Hyperglycemia, unspecified: Secondary | ICD-10-CM

## 2016-11-22 DIAGNOSIS — J45909 Unspecified asthma, uncomplicated: Secondary | ICD-10-CM | POA: Diagnosis not present

## 2016-11-22 DIAGNOSIS — Z794 Long term (current) use of insulin: Secondary | ICD-10-CM | POA: Insufficient documentation

## 2016-11-22 DIAGNOSIS — Y999 Unspecified external cause status: Secondary | ICD-10-CM | POA: Insufficient documentation

## 2016-11-22 DIAGNOSIS — R109 Unspecified abdominal pain: Secondary | ICD-10-CM

## 2016-11-22 DIAGNOSIS — Y939 Activity, unspecified: Secondary | ICD-10-CM | POA: Insufficient documentation

## 2016-11-22 DIAGNOSIS — Z79899 Other long term (current) drug therapy: Secondary | ICD-10-CM | POA: Insufficient documentation

## 2016-11-22 DIAGNOSIS — Y929 Unspecified place or not applicable: Secondary | ICD-10-CM | POA: Diagnosis not present

## 2016-11-22 LAB — URINALYSIS, ROUTINE W REFLEX MICROSCOPIC
BACTERIA UA: NONE SEEN
Bilirubin Urine: NEGATIVE
Glucose, UA: >=500 mg/dL — AB
Hgb urine dipstick: NEGATIVE
Ketones, ur: NEGATIVE mg/dL
Leukocytes, UA: NEGATIVE
NITRITE: NEGATIVE
PH: 5 (ref 5.0–8.0)
Protein, ur: 100 mg/dL — AB
SPECIFIC GRAVITY, URINE: 1.012 (ref 1.005–1.030)

## 2016-11-22 LAB — CBC
HEMATOCRIT: 32 % — AB (ref 36.0–46.0)
Hemoglobin: 10.7 g/dL — ABNORMAL LOW (ref 12.0–15.0)
MCH: 29.2 pg (ref 26.0–34.0)
MCHC: 33.4 g/dL (ref 30.0–36.0)
MCV: 87.2 fL (ref 78.0–100.0)
Platelets: 192 10*3/uL (ref 150–400)
RBC: 3.67 MIL/uL — AB (ref 3.87–5.11)
RDW: 14 % (ref 11.5–15.5)
WBC: 7.4 10*3/uL (ref 4.0–10.5)

## 2016-11-22 LAB — BASIC METABOLIC PANEL
ANION GAP: 8 (ref 5–15)
BUN: 25 mg/dL — AB (ref 6–20)
CHLORIDE: 102 mmol/L (ref 101–111)
CO2: 26 mmol/L (ref 22–32)
Calcium: 9.6 mg/dL (ref 8.9–10.3)
Creatinine, Ser: 1.2 mg/dL — ABNORMAL HIGH (ref 0.44–1.00)
GFR calc Af Amer: 47 mL/min — ABNORMAL LOW (ref >60)
GFR, EST NON AFRICAN AMERICAN: 41 mL/min — AB (ref >60)
GLUCOSE: 395 mg/dL — AB (ref 65–99)
POTASSIUM: 4.5 mmol/L (ref 3.5–5.1)
Sodium: 136 mmol/L (ref 135–145)

## 2016-11-22 MED ORDER — MORPHINE SULFATE (PF) 4 MG/ML IV SOLN
4.0000 mg | Freq: Once | INTRAVENOUS | Status: AC
  Administered 2016-11-22: 4 mg via INTRAVENOUS
  Filled 2016-11-22: qty 1

## 2016-11-22 MED ORDER — INSULIN ASPART 100 UNIT/ML ~~LOC~~ SOLN
10.0000 [IU] | Freq: Once | SUBCUTANEOUS | Status: AC
  Administered 2016-11-22: 10 [IU] via SUBCUTANEOUS
  Filled 2016-11-22: qty 1

## 2016-11-22 MED ORDER — ONDANSETRON HCL 4 MG/2ML IJ SOLN
4.0000 mg | Freq: Once | INTRAMUSCULAR | Status: AC
  Administered 2016-11-22: 4 mg via INTRAVENOUS
  Filled 2016-11-22: qty 2

## 2016-11-22 MED ORDER — TRAMADOL HCL 50 MG PO TABS: 50.0000 mg | ORAL_TABLET | Freq: Four times a day (QID) | ORAL | 0 refills | Status: DC | PRN

## 2016-11-22 NOTE — ED Triage Notes (Signed)
Pt reports "knot" on her L upper thigh that started 4 days ago. Pt states she is hurting all around her legs and lower back. Family states pt has had leg cramps for the past 2 nights that are worsening.

## 2016-11-22 NOTE — ED Provider Notes (Signed)
[facility] DEPT Provider Note   CSN: [id number] Arrival date & time: 11/22/16  1205 By signing my name below, I, Doran Stabler, attest that this documentation has been prepared under the direction and in the presence of Lajean Saver, MD. Electronically Signed: Doran Stabler, ED Scribe. 11/22/16. 1:06 PM.  History   Chief Complaint Chief Complaint  Patient presents with  . Leg Pain    The history is provided by the patient. No language interpreter was used.   HPI Comments: [name] is a 82 y.o. female who presents to the Emergency Department with a PMHx of dementia, DM, HTN, CAD, CHF, and renal insufficiency complaining of leg cramping that has been ongoing for past 2 weeks but worse with left thigh pain for the past 2 days. Daughter also reports a "knot" on the left upper thigh and chronic bladder incontinence. Daughter states the pt recently had a mechanical fall which may have contributed to her pain. Pt denies any fevers, chills, CP, SOB, N/V/D dysuria, or any other symptoms at this time.   Past Medical History:  Diagnosis Date  . Anemia   . Anxiety   . Arthritis    back, knees, and hips  . Asthma    with allergies  . Balance problems   . CAD (coronary artery disease)   . Carotid stenosis   . CHF (congestive heart failure) ([place])    EF preserved Echo 2012  . Chronic kidney infection   . 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 Dementia    "a little"  . Depression   . Diabetes mellitus    type 2  . Furuncle of back, except buttock   . GERD (gastroesophageal reflux disease)   . H/O seasonal allergies   . Hearing loss   . Hypertension   . Iron deficiency anemia 05/27/2011  . Myocardial infarction   . Pneumonia    hx of  . Renal insufficiency   . Shortness of breath dyspnea    with activity  . Speech problem    from stroke  . Swallowing difficulty   . Vertigo    "when  sugar gets low"  . Vision loss    left eye-"half blind"    Patient Active Problem List   Diagnosis Date Noted  . Overweight (BMI 25.0-29.9) 05/08/2016  . UTI (lower urinary tract infection) 11/12/2015  . DM hyperosmolarity type II, uncontrolled ([place]) 11/12/2015  . Dementia   . Encephalopathy, metabolic 44/02/4741  . S/P right TKA 08/13/2015  . S/P knee replacement 08/13/2015  . Elevated troponin 03/06/2015  . Fever   . History of diabetes mellitus   . Left buttock pain   . Hyperlipidemia 01/11/2015  . GAD (generalized anxiety disorder) 01/11/2015  . Depression 01/11/2015  . GERD (gastroesophageal reflux disease) 01/11/2015  . Osteopenia 07/13/2014  . Obesity (BMI 30-39.9) 06/19/2014  . Carotid stenosis   . Anemia of chronic disease 12/25/2011  . Iron deficiency anemia 05/27/2011  . Diastolic CHF, chronic ([place]) 04/02/2010  . Type 2 diabetes mellitus with insulin therapy ([place]) 10/08/2009  . DYSLIPIDEMIA 10/08/2009  . Essential hypertension 10/08/2009  . Coronary atherosclerosis 10/08/2009  . CAROTID STENOSIS 10/08/2009    Past Surgical History:  Procedure Laterality Date  . APPENDECTOMY     with hysterectomy  . CATARACT EXTRACTION Bilateral 5 years ago  . CHOLECYSTECTOMY    . CORONARY ANGIOPLASTY     prior  to 2006 5 stents  . CORONARY ARTERY BYPASS GRAFT  2006  . I & D of Furuncle  April 2013  . KNEE SURGERY Right   . TOTAL ABDOMINAL HYSTERECTOMY  ~82 years old   complete, with tumor removal  . TOTAL KNEE ARTHROPLASTY Right 08/13/2015   Procedure: RIGHT  TOTAL KNEE ARTHROPLASTY;  Surgeon: Paralee Cancel, MD;  Location: [facility];  Service: Orthopedics;  Laterality: Right;    OB History    Gravida Para Term Preterm AB Living   4 4 4     3    SAB TAB Ectopic Multiple Live Births                   Home Medications    Prior to Admission medications   Medication Sig Start Date End Date Taking? Authorizing Provider  amLODipine (NORVASC) 10 MG tablet Take 1 tablet (10  mg total) by mouth daily. 09/04/16   Wardell Honour, MD  Artificial Tear Ointment (DRY EYES OP) Apply 1 drop to eye daily as needed. For dry eye/ red eye     Historical Provider, MD  atorvastatin (LIPITOR) 20 MG tablet TAKE (1) TABLET DAILY AT BEDTIME. 10/05/16   Wardell Honour, MD  celecoxib (CELEBREX) 200 MG capsule Take 1 capsule (200 mg total) by mouth daily. With food 04/03/16   [name], MD  clopidogrel (PLAVIX) 75 MG tablet TAKE 1 TABLET ONCE DAILY. 11/03/16   Sharion Balloon, FNP  docusate sodium (COLACE) 100 MG capsule Take 1 capsule (100 mg total) by mouth 2 (two) times daily. 08/15/15   Danae Orleans, PA-C  EASY TOUCH INSULIN SYRINGE 31G X 5/16" 1 ML MISC USE AS DIRECTED. 02/07/15   Chipper Herb, MD  EASY TOUCH PEN NEEDLES 31G X 8 MM MISC AS DIRECTED 04/10/16   Wardell Honour, MD  esomeprazole (NEXIUM) 40 MG capsule TAKE 1 CAPSULE BY MOUTH ONCE DAILY 11/03/16   Sharion Balloon, FNP  ferrous sulfate 325 (65 FE) MG tablet TAKE (1) TABLET THREE TIMES DAILY AFTER MEALS. 10/05/16   Wardell Honour, MD  fluocinolone (VANOS) 0.01 % cream Apply topically 2 (two) times daily. 11/22/15   [name], MD  Fluocinonide 0.1 % CREA  11/22/15   Historical Provider, MD  furosemide (LASIX) 20 MG tablet Take 1 tablet (20 mg total) by mouth daily as needed for fluid or edema. 09/04/16   Wardell Honour, MD  HUMALOG KWIKPEN 100 UNIT/ML KiwkPen USE PER SLIDING SCALE NOTIFY MD OF RESULTS UNDER 60 OR OVER 300. 02/05/16   Sharion Balloon, FNP  HUMULIN N 100 UNIT/ML injection Inject 28 Units into the skin every morning. 09/04/15   Historical Provider, MD  Insulin Pen Needle 31G X 5 MM MISC Use with  Levemir injection 11/14/15   Silver Huguenin Elgergawy, MD  isosorbide mononitrate (IMDUR) 30 MG 24 hr tablet TAKE (1) TABLET BY MOUTH ONCE DAILY. 09/04/16   Wardell Honour, MD  LEVEMIR FLEXTOUCH 100 UNIT/ML Pen INJECT 28 UNITS EVERY MORNING 11/03/16   Sharion Balloon, FNP  linaclotide [facility]) 145 MCG CAPS capsule  Take 1 capsule (145 mcg total) by mouth daily. To regulate bowel movements 09/04/16   Wardell Honour, MD  lisinopril (PRINIVIL,ZESTRIL) 10 MG tablet Take 1 tablet (10 mg total) by mouth at bedtime. 11/18/16 02/16/17  Minus Breeding, MD  lisinopril (PRINIVIL,ZESTRIL) 20 MG tablet TAKE (1) TABLET BY MOUTH ONCE DAILY. 09/04/16   Wardell Honour, MD  LORazepam (ATIVAN)  0.5 MG tablet TAKE (1) TABLET TWICE DAILY as needed for anxiety 11/04/16   Wardell Honour, MD  meclizine (ANTIVERT) 25 MG tablet TAKE 1 TABLET TWICE DAILY AS NEEDED FOR DIZZINESS. Patient taking differently: TAKE 1 TABLET TWICE DAILY FOR DIZZINESS. 08/05/15   Chipper Herb, MD  metFORMIN (GLUCOPHAGE-XR) 500 MG 24 hr tablet TAKE 1 TABLET WITH BREAKFAST AND 1 TABLET WITH SUPPER. 09/04/16   Wardell Honour, MD  metoprolol (LOPRESSOR) 50 MG tablet TAKE (1) TABLET TWICE DAILY. 09/04/16   Wardell Honour, MD  Multiple Vitamins-Iron (DAILY VITAMINS/IRON/BETA CAROT PO) Take 1 tablet by mouth at bedtime.    Historical Provider, MD  mupirocin ointment (BACTROBAN) 2 % Place 1 application into the nose 2 (two) times daily. 01/06/16   Eustaquio Maize, MD  nitroGLYCERIN (NITROSTAT) 0.4 MG SL tablet PLACE ONE (1) TABLET UNDER TONGUE EVERY 5 MINUTES UP TO (3) DOSES AS NEEDED FOR CHEST PAIN. 09/04/16   Sharion Balloon, FNP  PARoxetine (PAXIL) 20 MG tablet TAKE 1 TABLET ONCE DAILY. 10/05/16   Wardell Honour, MD  polyethylene glycol [facility]) packet Take 17 g by mouth 2 (two) times daily. Patient taking differently: Take 17 g by mouth daily as needed for mild constipation.  08/15/15   Danae Orleans, PA-C  traMADol Veatrice Bourbon) 50 MG tablet 1/2 to 1 whole tab daily PRN 09/04/16   Wardell Honour, MD    Family History Family History  Problem Relation Age of Onset  . Cancer Brother     porstate  . Early death Sister   . Heart disease Brother   . Heart disease Brother   . Heart attack Brother   . Heart disease Brother   . Heart attack Brother    . Diabetes Sister   . Heart attack Sister   . Diabetes Sister   . Osteoporosis Sister   . Hypertension Sister     Social History Social History  Substance Use Topics  . Smoking status: Never Smoker  . Smokeless tobacco: Never Used  . Alcohol use No     Allergies   Advil [ibuprofen]; Heparin; and Propoxyphene n-acetaminophen   Review of Systems Review of Systems  Constitutional: Negative for chills and fever.  Respiratory: Negative for shortness of breath.   Cardiovascular: Negative for chest pain.  Gastrointestinal: Negative for diarrhea, nausea and vomiting.  Musculoskeletal: Positive for myalgias.  All other systems reviewed and are negative.  Physical Exam Updated Vital Signs BP 131/57 (BP Location: Left Arm)   Pulse (!) 59   Temp 98.9 F (37.2 C) (Oral)   Resp 16   Ht 5\' 6"  (1.676 m)   Wt 156 lb (70.8 kg)   SpO2 98%   BMI 25.18 kg/m   Physical Exam  Constitutional: She is oriented to person, place, and time. She appears well-developed and well-nourished. No distress.  HENT:  Head: Normocephalic and atraumatic.  Eyes: EOM are normal.  Neck: Normal range of motion.  Cardiovascular: Normal rate, regular rhythm and normal heart sounds.   Pulmonary/Chest: Effort normal and breath sounds normal.  Abdominal: Soft. She exhibits no distension. There is tenderness.  Left sided abdominal tenderness. No puls mass.   Genitourinary:  Genitourinary Comments: No cva tenderness  Musculoskeletal:  TLS spine non tender, aligned, no step off. No sts noted. Lumbar muscular tenderness. Good rom at left hip and knee without pain. Distal pulses palp.   Neurological: She is alert and oriented to person, place, and time.  Skin: Skin is warm and dry. No rash noted.  No shingles/rash in area of pain  Psychiatric: She has a normal mood and affect. Judgment normal.  Nursing note and vitals reviewed.  ED Treatments / Results  DIAGNOSTIC STUDIES: Oxygen Saturation is 98% on room  air, normal by my interpretation.    COORDINATION OF CARE: 1:06 PM Discussed treatment plan with pt and family at bedside including radiological studies. They agreed to plan.  Labs (all labs ordered are listed, but only abnormal results are displayed) Results for orders placed or performed during the hospital encounter of 11/22/16  Urinalysis, Routine w reflex microscopic  Result Value Ref Range   Color, Urine YELLOW YELLOW   APPearance CLEAR CLEAR   Specific Gravity, Urine 1.012 1.005 - 1.030   pH 5.0 5.0 - 8.0   Glucose, UA >=500 (A) NEGATIVE mg/dL   Hgb urine dipstick NEGATIVE NEGATIVE   Bilirubin Urine NEGATIVE NEGATIVE   Ketones, ur NEGATIVE NEGATIVE mg/dL   Protein, ur 100 (A) NEGATIVE mg/dL   Nitrite NEGATIVE NEGATIVE   Leukocytes, UA NEGATIVE NEGATIVE   RBC / HPF 0-5 0 - 5 RBC/hpf   WBC, UA 0-5 0 - 5 WBC/hpf   Bacteria, UA NONE SEEN NONE SEEN   Mucous PRESENT   CBC  Result Value Ref Range   WBC 7.4 4.0 - 10.5 K/uL   RBC 3.67 (L) 3.87 - 5.11 MIL/uL   Hemoglobin 10.7 (L) 12.0 - 15.0 g/dL   HCT 32.0 (L) 36.0 - 46.0 %   MCV 87.2 78.0 - 100.0 fL   MCH 29.2 26.0 - 34.0 pg   MCHC 33.4 30.0 - 36.0 g/dL   RDW 14.0 11.5 - 15.5 %   Platelets 192 150 - 400 K/uL  Basic metabolic panel  Result Value Ref Range   Sodium 136 135 - 145 mmol/L   Potassium 4.5 3.5 - 5.1 mmol/L   Chloride 102 101 - 111 mmol/L   CO2 26 22 - 32 mmol/L   Glucose, Bld 395 (H) 65 - 99 mg/dL   BUN 25 (H) 6 - 20 mg/dL   Creatinine, Ser 1.20 (H) 0.44 - 1.00 mg/dL   Calcium 9.6 8.9 - 10.3 mg/dL   GFR calc non Af Amer 41 (L) >60 mL/min   GFR calc Af Amer 47 (L) >60 mL/min   Anion gap 8 5 - 15   Ct Abdomen Pelvis Wo Contrast  Result Date: 11/22/2016 CLINICAL DATA:  Left flank and back pain. EXAM: CT ABDOMEN AND PELVIS WITHOUT CONTRAST TECHNIQUE: Multidetector CT imaging of the abdomen and pelvis was performed following the standard protocol without IV contrast. COMPARISON:  03/06/2015 FINDINGS: Lower  chest: Cardiomegaly. Densely calcified mitral valve and visualized right coronary artery. Lung bases clear. No effusions. Hepatobiliary: Scattered calcifications throughout the liver compatible with old granulomatous disease. Prior cholecystectomy. Pancreas: No focal abnormality or ductal dilatation. Spleen: No focal abnormality.  Normal size. Adrenals/Urinary Tract: No hydronephrosis. Punctate nonobstructing stone in the upper pole of the left kidney. No ureteral stones or hydronephrosis. Punctate calcifications scattered within the parenchyma of the mid and lower left kidney. Stomach/Bowel: Large stool burden throughout the colon. Stomach and small bowel unremarkable. Vascular/Lymphatic: Diffuse aortic and iliac calcifications. No aneurysm or adenopathy. Reproductive: Prior hysterectomy.  No adnexal masses. Other: No free fluid or free air. Musculoskeletal: Diffuse degenerative changes throughout the lumbar spine. Old compression fractures at L3. Moderate compression fracture at L1 which is new since March 2016 but appears chronic. IMPRESSION: Punctate nonobstructing  left upper pole renal stone. No ureteral stones or hydronephrosis. Old granulomatous disease within the liver. Coronary artery disease, aortic atherosclerosis. Large stool burden in the colon. Electronically Signed   By: Rolm Baptise M.D.   On: 11/22/2016 15:30    EKG  EKG Interpretation None       Radiology No results found.  Procedures Procedures (including critical care time)  Medications Ordered in ED Medications - No data to display   Initial Impression / Assessment and Plan / ED Course  I have reviewed the triage vital signs and the nursing notes.  Pertinent labs & imaging results that were available during my care of the patient were reviewed by me and considered in my medical decision making (see chart for details).  Clinical Course     Iv ns. Morphine iv.   Labs. Ct.  Ct neg for acute intra-abd  process.  Recheck pt comfortable. No distress.  Pt currently appears stable for d/c.   Pt has pcp appt 12/27 already arranged.   Final Clinical Impressions(s) / ED Diagnoses   Final diagnoses:  None    New Prescriptions New Prescriptions   No medications on file  I personally performed the services described in this documentation, which was scribed in my presence. The recorded information has been reviewed and considered. Lajean Saver, MD     Lajean Saver, MD 11/22/16 [phone]

## 2016-11-22 NOTE — Discharge Instructions (Signed)
It was our pleasure to provide your ER care today - we hope that you feel better.  Your blood sugar is high today (395) - drink adequate fluids, continue your diabetic medication, and follow up with your doctor in the next 1-2 weeks.   You may take ultram as need for pain.   Follow up with your doctor in the next 1-2 weeks as planned.  Return to ER if worse, new symptoms, fevers, intractable pain, other concern.

## 2016-11-24 ENCOUNTER — Ambulatory Visit: Payer: Medicare Other | Admitting: Physical Therapy

## 2016-11-24 DIAGNOSIS — R260 Ataxic gait: Secondary | ICD-10-CM | POA: Diagnosis not present

## 2016-11-24 DIAGNOSIS — R293 Abnormal posture: Secondary | ICD-10-CM | POA: Diagnosis not present

## 2016-11-24 DIAGNOSIS — M6281 Muscle weakness (generalized): Secondary | ICD-10-CM | POA: Diagnosis not present

## 2016-11-24 DIAGNOSIS — R2689 Other abnormalities of gait and mobility: Secondary | ICD-10-CM | POA: Diagnosis not present

## 2016-11-26 ENCOUNTER — Ambulatory Visit: Payer: Medicare Other | Admitting: Physical Therapy

## 2016-12-01 ENCOUNTER — Encounter: Payer: Medicare Other | Admitting: Physical Therapy

## 2016-12-01 NOTE — Progress Notes (Signed)
Subjective:    [name], female    DOB: 07-05-1931, 82 y.o.   MRN: [id number]  HPI 82 year old female with diabetes hypertension hyperlipidemia coronary disease and arthritis. Recent visit to the emergency room for right-sided abdominal pain included a CT scan. She does have arthritis in the hip as well as old compression fracture in her back. There is a stone in her left kidney likely the source of her pain. She has chronic bowel issues, primarily constipation. His thought that her pain probably is a result of arthritis, constipation, and compression fracture.  Patient Active Problem List   Diagnosis Date Noted  . Overweight (BMI 25.0-29.9) 05/08/2016  . UTI (lower urinary tract infection) 11/12/2015  . DM hyperosmolarity type II, uncontrolled ([place]) 11/12/2015  . Dementia   . Encephalopathy, metabolic 09/73/5329  . S/P right TKA 08/13/2015  . S/P knee replacement 08/13/2015  . Elevated troponin 03/06/2015  . Fever   . History of diabetes mellitus   . Left buttock pain   . Hyperlipidemia 01/11/2015  . GAD (generalized anxiety disorder) 01/11/2015  . Depression 01/11/2015  . GERD (gastroesophageal reflux disease) 01/11/2015  . Osteopenia 07/13/2014  . Obesity (BMI 30-39.9) 06/19/2014  . Carotid stenosis   . Anemia of chronic disease 12/25/2011  . Iron deficiency anemia 05/27/2011  . Diastolic CHF, chronic ([place]) 04/02/2010  . Type 2 diabetes mellitus with insulin therapy ([place]) 10/08/2009  . DYSLIPIDEMIA 10/08/2009  . Essential hypertension 10/08/2009  . Coronary atherosclerosis 10/08/2009  . CAROTID STENOSIS 10/08/2009   Outpatient Encounter Prescriptions as of 12/02/2016  Medication Sig  . amLODipine (NORVASC) 10 MG tablet Take 1 tablet (10 mg total) by mouth daily.  . Artificial Tear Ointment (DRY EYES OP) Apply 1 drop to eye daily as needed. For dry eye/ red eye   . atorvastatin (LIPITOR) 20 MG tablet TAKE (1) TABLET DAILY AT BEDTIME.  . celecoxib  (CELEBREX) 200 MG capsule Take 1 capsule (200 mg total) by mouth daily. With food  . clopidogrel (PLAVIX) 75 MG tablet TAKE 1 TABLET ONCE DAILY.  Marland Kitchen docusate sodium (COLACE) 100 MG capsule Take 1 capsule (100 mg total) by mouth 2 (two) times daily.  Marland Kitchen EASY TOUCH INSULIN SYRINGE 31G X 5/16" 1 ML MISC USE AS DIRECTED.  Marland Kitchen EASY TOUCH PEN NEEDLES 31G X 8 MM MISC AS DIRECTED  . esomeprazole (NEXIUM) 40 MG capsule TAKE 1 CAPSULE BY MOUTH ONCE DAILY  . ferrous sulfate 325 (65 FE) MG tablet TAKE (1) TABLET THREE TIMES DAILY AFTER MEALS.  . fluocinolone (VANOS) 0.01 % cream Apply topically 2 (two) times daily.  . furosemide (LASIX) 20 MG tablet Take 1 tablet (20 mg total) by mouth daily as needed for fluid or edema.  Marland Kitchen HUMALOG KWIKPEN 100 UNIT/ML KiwkPen USE PER SLIDING SCALE NOTIFY MD OF RESULTS UNDER 60 OR OVER 300.  . Insulin Pen Needle 31G X 5 MM MISC Use with  Levemir injection  . isosorbide mononitrate (IMDUR) 30 MG 24 hr tablet TAKE (1) TABLET BY MOUTH ONCE DAILY.  Marland Kitchen LEVEMIR FLEXTOUCH 100 UNIT/ML Pen INJECT 28 UNITS EVERY MORNING  . linaclotide (LINZESS) 145 MCG CAPS capsule Take 1 capsule (145 mcg total) by mouth daily. To regulate bowel movements  . lisinopril (PRINIVIL,ZESTRIL) 10 MG tablet Take 1 tablet (10 mg total) by mouth at bedtime.  Marland Kitchen lisinopril (PRINIVIL,ZESTRIL) 20 MG tablet TAKE (1) TABLET BY MOUTH ONCE DAILY.  Marland Kitchen LORazepam (ATIVAN) 0.5 MG tablet TAKE (1) TABLET TWICE DAILY as needed  for anxiety  . meclizine (ANTIVERT) 25 MG tablet TAKE 1 TABLET TWICE DAILY AS NEEDED FOR DIZZINESS. (Patient taking differently: TAKE 1 TABLET TWICE DAILY FOR DIZZINESS.)  . metFORMIN (GLUCOPHAGE-XR) 500 MG 24 hr tablet TAKE 1 TABLET WITH BREAKFAST AND 1 TABLET WITH SUPPER.  . metoprolol (LOPRESSOR) 50 MG tablet TAKE (1) TABLET TWICE DAILY.  . Multiple Vitamins-Iron (DAILY VITAMINS/IRON/BETA CAROT PO) Take 1 tablet by mouth at bedtime.  . mupirocin ointment (BACTROBAN) 2 % Place 1 application into the nose 2  (two) times daily.  . nitroGLYCERIN (NITROSTAT) 0.4 MG SL tablet PLACE ONE (1) TABLET UNDER TONGUE EVERY 5 MINUTES UP TO (3) DOSES AS NEEDED FOR CHEST PAIN.  Marland Kitchen PARoxetine (PAXIL) 20 MG tablet TAKE 1 TABLET ONCE DAILY.  Marland Kitchen polyethylene glycol (MIRALAX / GLYCOLAX) packet Take 17 g by mouth 2 (two) times daily. (Patient taking differently: Take 17 g by mouth daily as needed for mild constipation. )  . traMADol (ULTRAM) 50 MG tablet Take 1 tablet (50 mg total) by mouth every 6 (six) hours as needed. (Patient not taking: Reported on 12/02/2016)  . [DISCONTINUED] traMADol (ULTRAM) 50 MG tablet 1/2 to 1 whole tab daily PRN   No facility-administered encounter medications on file as of 12/02/2016.      Review of Systems  Constitutional: Negative.   Respiratory: Negative.   Cardiovascular: Negative.   Gastrointestinal: Positive for abdominal pain.  Genitourinary: Positive for dysuria.  Musculoskeletal: Positive for back pain.  Neurological: Negative.   Psychiatric/Behavioral: Negative.        Objective:   Physical Exam  Constitutional: She is oriented to person, place, and time. She appears well-developed and well-nourished.  Cardiovascular: Normal rate and regular rhythm.   Pulmonary/Chest: Breath sounds normal.  Abdominal: Soft. There is no tenderness.  Neurological: She is alert and oriented to person, place, and time.  Psychiatric: She has a normal mood and affect. Her behavior is normal.   BP (!) 169/78 (BP Location: Left Arm, Patient Position: Sitting, Cuff Size: Normal)   Pulse (!) 55   Temp 97 F (36.1 C) (Oral)   Ht 5\' 6"  (1.676 m)   Wt 160 lb (72.6 kg)   BMI 25.82 kg/m         Assessment & Plan:  1. Essential hypertension Blood pressure is fairly controlled with amlodipine and lisinopril and Imdur and metoprolol  2. Type 2 diabetes mellitus with insulin therapy [facility]) Patient is on multidrug therapy. Last A1c was 8.2 - Bayer DCA Hb A1c Waived  3. Hyperlipidemia,  unspecified hyperlipidemia type Lipids were at goal when last assessed and will be repeated at next visit  4. Dysuria Urinalysis is pending  Wardell Honour MD - Urinalysis, Complete

## 2016-12-02 ENCOUNTER — Encounter: Payer: Self-pay | Admitting: Family Medicine

## 2016-12-02 ENCOUNTER — Ambulatory Visit (INDEPENDENT_AMBULATORY_CARE_PROVIDER_SITE_OTHER): Payer: Medicare Other | Admitting: Family Medicine

## 2016-12-02 VITALS — BP 169/78 | HR 55 | Temp 97.0°F | Ht 66.0 in | Wt 160.0 lb

## 2016-12-02 DIAGNOSIS — E785 Hyperlipidemia, unspecified: Secondary | ICD-10-CM

## 2016-12-02 DIAGNOSIS — M545 Low back pain, unspecified: Secondary | ICD-10-CM

## 2016-12-02 DIAGNOSIS — K219 Gastro-esophageal reflux disease without esophagitis: Secondary | ICD-10-CM

## 2016-12-02 DIAGNOSIS — R300 Dysuria: Secondary | ICD-10-CM

## 2016-12-02 DIAGNOSIS — I25709 Atherosclerosis of coronary artery bypass graft(s), unspecified, with unspecified angina pectoris: Secondary | ICD-10-CM | POA: Diagnosis not present

## 2016-12-02 DIAGNOSIS — F411 Generalized anxiety disorder: Secondary | ICD-10-CM | POA: Diagnosis not present

## 2016-12-02 DIAGNOSIS — Z794 Long term (current) use of insulin: Secondary | ICD-10-CM

## 2016-12-02 DIAGNOSIS — I10 Essential (primary) hypertension: Secondary | ICD-10-CM

## 2016-12-02 DIAGNOSIS — G8929 Other chronic pain: Secondary | ICD-10-CM

## 2016-12-02 DIAGNOSIS — E119 Type 2 diabetes mellitus without complications: Secondary | ICD-10-CM | POA: Diagnosis not present

## 2016-12-02 LAB — MICROSCOPIC EXAMINATION: RENAL EPITHEL UA: NONE SEEN /HPF

## 2016-12-02 LAB — URINALYSIS, COMPLETE
BILIRUBIN UA: NEGATIVE
Glucose, UA: NEGATIVE
Ketones, UA: NEGATIVE
NITRITE UA: POSITIVE — AB
PH UA: 8.5 — AB (ref 5.0–7.5)
Specific Gravity, UA: 1.015 (ref 1.005–1.030)
UUROB: 0.2 mg/dL (ref 0.2–1.0)

## 2016-12-02 LAB — BAYER DCA HB A1C WAIVED: HB A1C (BAYER DCA - WAIVED): 8.9 % — ABNORMAL HIGH (ref <7.0)

## 2016-12-02 MED ORDER — CELECOXIB 200 MG PO CAPS: 200.0000 mg | ORAL_CAPSULE | Freq: Every day | ORAL | 6 refills | Status: DC

## 2016-12-02 MED ORDER — PAROXETINE HCL 20 MG PO TABS: 20.0000 mg | ORAL_TABLET | Freq: Every day | ORAL | 6 refills | Status: DC

## 2016-12-02 MED ORDER — LINACLOTIDE 145 MCG PO CAPS: 145.0000 ug | ORAL_CAPSULE | Freq: Every day | ORAL | 6 refills | Status: DC

## 2016-12-02 MED ORDER — METOPROLOL TARTRATE 50 MG PO TABS: ORAL_TABLET | ORAL | 6 refills | Status: DC

## 2016-12-02 MED ORDER — ISOSORBIDE MONONITRATE ER 30 MG PO TB24: ORAL_TABLET | ORAL | 6 refills | Status: DC

## 2016-12-02 MED ORDER — CLOPIDOGREL BISULFATE 75 MG PO TABS: 75.0000 mg | ORAL_TABLET | Freq: Every day | ORAL | 6 refills | Status: DC

## 2016-12-02 MED ORDER — AMLODIPINE BESYLATE 10 MG PO TABS: 10.0000 mg | ORAL_TABLET | Freq: Every day | ORAL | 6 refills | Status: DC

## 2016-12-02 MED ORDER — ATORVASTATIN CALCIUM 20 MG PO TABS: ORAL_TABLET | ORAL | 6 refills | Status: DC

## 2016-12-02 MED ORDER — LISINOPRIL 20 MG PO TABS: ORAL_TABLET | ORAL | 6 refills | Status: DC

## 2016-12-02 MED ORDER — LORAZEPAM 0.5 MG PO TABS: ORAL_TABLET | ORAL | 1 refills | Status: DC

## 2016-12-02 MED ORDER — DOCUSATE SODIUM 100 MG PO CAPS: 100.0000 mg | ORAL_CAPSULE | Freq: Two times a day (BID) | ORAL | 0 refills | Status: DC

## 2016-12-02 MED ORDER — METFORMIN HCL ER 500 MG PO TB24: ORAL_TABLET | ORAL | 6 refills | Status: DC

## 2016-12-02 MED ORDER — TRAMADOL HCL 50 MG PO TABS: 50.0000 mg | ORAL_TABLET | Freq: Four times a day (QID) | ORAL | 1 refills | Status: DC | PRN

## 2016-12-02 MED ORDER — FUROSEMIDE 20 MG PO TABS: 20.0000 mg | ORAL_TABLET | Freq: Every day | ORAL | 6 refills | Status: DC | PRN

## 2016-12-02 MED ORDER — ESOMEPRAZOLE MAGNESIUM 40 MG PO CPDR: 40.0000 mg | DELAYED_RELEASE_CAPSULE | Freq: Every day | ORAL | 6 refills | Status: DC

## 2016-12-02 MED ORDER — MECLIZINE HCL 25 MG PO TABS: ORAL_TABLET | ORAL | 2 refills | Status: DC

## 2016-12-02 NOTE — Addendum Note (Signed)
Addended by: Nigel Berthold C on: 12/02/2016 12:03 PM   Modules accepted: Orders

## 2016-12-03 ENCOUNTER — Encounter: Payer: Medicare Other | Admitting: Physical Therapy

## 2016-12-04 LAB — URINE CULTURE

## 2016-12-10 MED ORDER — CIPROFLOXACIN HCL 250 MG PO TABS: 250.0000 mg | ORAL_TABLET | Freq: Two times a day (BID) | ORAL | 0 refills | Status: DC

## 2016-12-10 NOTE — Addendum Note (Signed)
Addended by: Thana Ates on: 12/10/2016 08:33 AM   Modules accepted: Orders

## 2016-12-21 ENCOUNTER — Ambulatory Visit (INDEPENDENT_AMBULATORY_CARE_PROVIDER_SITE_OTHER): Payer: Medicare Other | Admitting: Family Medicine

## 2016-12-21 ENCOUNTER — Encounter: Payer: Self-pay | Admitting: Family Medicine

## 2016-12-21 VITALS — BP 144/77 | HR 58 | Temp 96.3°F | Ht 66.0 in | Wt 155.2 lb

## 2016-12-21 DIAGNOSIS — M7918 Myalgia, other site: Secondary | ICD-10-CM

## 2016-12-21 DIAGNOSIS — L723 Sebaceous cyst: Secondary | ICD-10-CM

## 2016-12-21 DIAGNOSIS — M791 Myalgia: Secondary | ICD-10-CM | POA: Diagnosis not present

## 2016-12-21 MED ORDER — TRAMADOL HCL 50 MG PO TABS: 50.0000 mg | ORAL_TABLET | Freq: Four times a day (QID) | ORAL | 1 refills | Status: DC | PRN

## 2016-12-21 MED ORDER — CEPHALEXIN 500 MG PO CAPS
500.0000 mg | ORAL_CAPSULE | Freq: Four times a day (QID) | ORAL | 0 refills | Status: DC
Start: 2016-12-21 — End: 2016-12-27

## 2016-12-21 NOTE — Progress Notes (Signed)
BP (!) 144/77   Pulse (!) 58   Temp (!) 96.3 F (35.7 C) (Oral)   Ht 5\' 6"  (1.676 m)   Wt 155 lb 3.2 oz (70.4 kg)   BMI 25.05 kg/m    Subjective:    [name], female    DOB: 05-Feb-1931, 83 y.o.   MRN: [id number]  HPI: [name] is a 83 y.o. female presenting on 12/21/2016 for Back Pain (tailbone & down legs now is sitting alot forward so she is hurting in upper back as well)   HPI Back pain on left side radiating down left leg. Patient has been having low back pain and fighting off and on for quite some time. She did get a small amount of tramadol from Dr. Sabra Heck who is her PCP and she is wondering if she get a little bit more. She had 2 pills left. The pain has been down her left buttock and down into the back of her left hamstring. She has been having a lot of difficulties ambulating her and she was told by Dr. Sabra Heck that she may have had a fracture in that hip and that is causing a lot of her issues. She was not a surgical candidate so they did not look into her period.  Cyst that is draining on back Patient has been having a cyst that is draining on her back that her daughter who is with her noticed a couple days ago. She said it did drain a thick purulent drainage but since it was so tender she did not squeeze similar to see if it drained some more. She denies any fevers or chills or redness or warmth around the site. It just is draining that thick purulent drainage can be very tender to palpation at times.  Relevant past medical, surgical, family and social history reviewed and updated as indicated. Interim medical history since our last visit reviewed. Allergies and medications reviewed and updated.  Review of Systems  Constitutional: Negative for chills and fever.  HENT: Negative for congestion, ear discharge and ear pain.   Eyes: Negative for redness and visual disturbance.  Respiratory: Negative for chest tightness and shortness of breath.     Cardiovascular: Negative for chest pain and leg swelling.  Genitourinary: Negative for difficulty urinating and dysuria.  Musculoskeletal: Positive for back pain and myalgias. Negative for gait problem.  Skin: Positive for color change. Negative for rash.  Neurological: Negative for light-headedness and headaches.  Psychiatric/Behavioral: Negative for agitation and behavioral problems.  All other systems reviewed and are negative.   Per HPI unless specifically indicated above      Objective:    BP (!) 144/77   Pulse (!) 58   Temp (!) 96.3 F (35.7 C) (Oral)   Ht 5\' 6"  (1.676 m)   Wt 155 lb 3.2 oz (70.4 kg)   BMI 25.05 kg/m   Wt Readings from Last 3 Encounters:  12/21/16 155 lb 3.2 oz (70.4 kg)  12/02/16 160 lb (72.6 kg)  11/22/16 156 lb (70.8 kg)    Physical Exam  Constitutional: She is oriented to person, place, and time. She appears well-developed and well-nourished. No distress.  Eyes: Conjunctivae are normal.  Cardiovascular: Normal rate, regular rhythm, normal heart sounds and intact distal pulses.   No murmur heard. Pulmonary/Chest: Effort normal and breath sounds normal. No respiratory distress. She has no wheezes. She has no rales.  Musculoskeletal: Normal range of motion. She exhibits tenderness (Low back tenderness  and tenderness with external and internal rotation of the hip.). She exhibits no edema.  Neurological: She is alert and oriented to person, place, and time. Coordination normal.  Skin: Skin is warm and dry. Lesion (Small 0.1 cm cystic lesion with thick drainage, consistent with sebaceous cyst, tender to palpation, no signs of erythema or warmth.) noted. No rash noted. She is not diaphoretic.  Psychiatric: She has a normal mood and affect. Her behavior is normal.  Nursing note and vitals reviewed.       Assessment & Plan:   Problem List Items Addressed This Visit    None    Visit Diagnoses    Sebaceous cyst    -  Primary   Middle of back, will  send wound culture, sent Keflex,   Relevant Medications   cephALEXin (KEFLEX) 500 MG capsule   Other Relevant Orders   Anaerobic and Aerobic Culture   Acute buttock pain       Relevant Medications   traMADol (ULTRAM) 50 MG tablet       Follow up plan: Return if symptoms worsen or fail to improve.  Counseling provided for all of the vaccine components Orders Placed This Encounter  Procedures  . Anaerobic and Aerobic Culture    Caryl Pina, MD [facility] 12/21/2016, 5:13 PM

## 2016-12-27 ENCOUNTER — Emergency Department (HOSPITAL_COMMUNITY)
Admission: EM | Admit: 2016-12-27 | Discharge: 2016-12-27 | Disposition: A | Discharge status: Home Health | Payer: Medicare Other | Attending: Emergency Medicine | Admitting: Emergency Medicine

## 2016-12-27 ENCOUNTER — Emergency Department (HOSPITAL_COMMUNITY): Payer: Medicare Other

## 2016-12-27 ENCOUNTER — Encounter (HOSPITAL_COMMUNITY): Payer: Self-pay | Admitting: *Deleted

## 2016-12-27 DIAGNOSIS — R4182 Altered mental status, unspecified: Secondary | ICD-10-CM | POA: Diagnosis not present

## 2016-12-27 DIAGNOSIS — Z794 Long term (current) use of insulin: Secondary | ICD-10-CM | POA: Diagnosis not present

## 2016-12-27 DIAGNOSIS — Z79899 Other long term (current) drug therapy: Secondary | ICD-10-CM | POA: Insufficient documentation

## 2016-12-27 DIAGNOSIS — N3001 Acute cystitis with hematuria: Secondary | ICD-10-CM | POA: Diagnosis not present

## 2016-12-27 DIAGNOSIS — I2510 Atherosclerotic heart disease of native coronary artery without angina pectoris: Secondary | ICD-10-CM | POA: Diagnosis not present

## 2016-12-27 DIAGNOSIS — I5030 Unspecified diastolic (congestive) heart failure: Secondary | ICD-10-CM | POA: Insufficient documentation

## 2016-12-27 DIAGNOSIS — R109 Unspecified abdominal pain: Secondary | ICD-10-CM | POA: Diagnosis not present

## 2016-12-27 DIAGNOSIS — E119 Type 2 diabetes mellitus without complications: Secondary | ICD-10-CM | POA: Insufficient documentation

## 2016-12-27 DIAGNOSIS — J45909 Unspecified asthma, uncomplicated: Secondary | ICD-10-CM | POA: Diagnosis not present

## 2016-12-27 DIAGNOSIS — I110 Hypertensive heart disease with heart failure: Secondary | ICD-10-CM | POA: Insufficient documentation

## 2016-12-27 LAB — URINALYSIS, ROUTINE W REFLEX MICROSCOPIC
BILIRUBIN URINE: NEGATIVE
Glucose, UA: 100 mg/dL — AB
Ketones, ur: NEGATIVE mg/dL
Nitrite: NEGATIVE
PH: 5.5 (ref 5.0–8.0)
Protein, ur: 100 mg/dL — AB

## 2016-12-27 LAB — CBC
HCT: 31.9 % — ABNORMAL LOW (ref 36.0–46.0)
Hemoglobin: 11.1 g/dL — ABNORMAL LOW (ref 12.0–15.0)
MCH: 29.8 pg (ref 26.0–34.0)
MCHC: 34.8 g/dL (ref 30.0–36.0)
MCV: 85.8 fL (ref 78.0–100.0)
PLATELETS: 198 10*3/uL (ref 150–400)
RBC: 3.72 MIL/uL — ABNORMAL LOW (ref 3.87–5.11)
RDW: 14.2 % (ref 11.5–15.5)
WBC: 4.8 10*3/uL (ref 4.0–10.5)

## 2016-12-27 LAB — COMPREHENSIVE METABOLIC PANEL
ALK PHOS: 42 U/L (ref 38–126)
ALT: 11 U/L — AB (ref 14–54)
AST: 20 U/L (ref 15–41)
Albumin: 3.5 g/dL (ref 3.5–5.0)
Anion gap: 8 (ref 5–15)
BILIRUBIN TOTAL: 0.7 mg/dL (ref 0.3–1.2)
BUN: 25 mg/dL — AB (ref 6–20)
CALCIUM: 9.7 mg/dL (ref 8.9–10.3)
CO2: 25 mmol/L (ref 22–32)
CREATININE: 1.09 mg/dL — AB (ref 0.44–1.00)
Chloride: 104 mmol/L (ref 101–111)
GFR calc Af Amer: 53 mL/min — ABNORMAL LOW (ref >60)
GFR, EST NON AFRICAN AMERICAN: 46 mL/min — AB (ref >60)
Glucose, Bld: 201 mg/dL — ABNORMAL HIGH (ref 65–99)
Potassium: 4 mmol/L (ref 3.5–5.1)
Sodium: 137 mmol/L (ref 135–145)
TOTAL PROTEIN: 6.9 g/dL (ref 6.5–8.1)

## 2016-12-27 LAB — URINALYSIS, MICROSCOPIC (REFLEX): SQUAMOUS EPITHELIAL / LPF: NONE SEEN

## 2016-12-27 MED ORDER — MORPHINE SULFATE (PF) 4 MG/ML IV SOLN
4.0000 mg | Freq: Once | INTRAVENOUS | Status: AC
  Administered 2016-12-27: 4 mg via INTRAVENOUS
  Filled 2016-12-27: qty 1

## 2016-12-27 MED ORDER — CIPROFLOXACIN HCL 500 MG PO TABS: 500.0000 mg | ORAL_TABLET | Freq: Two times a day (BID) | ORAL | 0 refills | Status: DC

## 2016-12-27 MED ORDER — CIPROFLOXACIN IN D5W 400 MG/200ML IV SOLN: 400.0000 mg | Freq: Once | INTRAVENOUS | Status: DC

## 2016-12-27 MED ORDER — CIPROFLOXACIN HCL 250 MG PO TABS
500.0000 mg | ORAL_TABLET | Freq: Once | ORAL | Status: AC
  Administered 2016-12-27: 500 mg via ORAL
  Filled 2016-12-27: qty 2

## 2016-12-27 NOTE — ED Provider Notes (Signed)
[facility] DEPT Provider Note   CSN: [id number] Arrival date & time: 12/27/16  0945  By signing my name below, I, Hilbert Odor, attest that this documentation has been prepared under the direction and in the presence of Jola Schmidt, MD. Electronically Signed: Hilbert Odor, Scribe. 12/27/16. 10:47 AM. History   Chief Complaint Chief Complaint  Patient presents with  . Altered Mental Status     The history is provided by the patient and a relative. No language interpreter was used.   HPI Comments: [name] is a 83 y.o. female who presents to the Emergency Department complaining of lower left flank pain and altered mental status since yesterday morning. This flank pain radiates to her left leg. She currently has a UTI which she is on antibiotics. She was diagnosed with UTI around 3 weeks ago. Per family: she is confused. She had fell about 6 months ago and fractured her coccyx. She denies vomiting, appetite, diarrhea, and nausea. She states that the patient has been constipated recently. Past Medical History:  Diagnosis Date  . Anemia   . Anxiety   . Arthritis    back, knees, and hips  . Asthma    with allergies  . Balance problems   . CAD (coronary artery disease)   . Carotid stenosis   . CHF (congestive heart failure) ([place])    EF preserved Echo 2012  . Chronic kidney infection   . 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 Dementia    "a little"  . Depression   . Diabetes mellitus    type 2  . Furuncle of back, except buttock   . GERD (gastroesophageal reflux disease)   . H/O seasonal allergies   . Hearing loss   . Hypertension   . Iron deficiency anemia 05/27/2011  . Myocardial infarction   . Pneumonia    hx of  . Renal insufficiency   . Shortness of breath dyspnea    with activity  . Speech problem    from stroke  . Swallowing difficulty   . Vertigo    "when  sugar gets low"  . Vision loss    left eye-"half blind"    Patient Active Problem List   Diagnosis Date Noted  . Overweight (BMI 25.0-29.9) 05/08/2016  . UTI (lower urinary tract infection) 11/12/2015  . DM hyperosmolarity type II, uncontrolled ([place]) 11/12/2015  . Dementia   . Encephalopathy, metabolic 71/05/2693  . S/P right TKA 08/13/2015  . S/P knee replacement 08/13/2015  . Elevated troponin 03/06/2015  . Fever   . History of diabetes mellitus   . Left buttock pain   . Hyperlipidemia 01/11/2015  . GAD (generalized anxiety disorder) 01/11/2015  . Depression 01/11/2015  . GERD (gastroesophageal reflux disease) 01/11/2015  . Osteopenia 07/13/2014  . Obesity (BMI 30-39.9) 06/19/2014  . Carotid stenosis   . Anemia of chronic disease 12/25/2011  . Iron deficiency anemia 05/27/2011  . Diastolic CHF, chronic ([place]) 04/02/2010  . Type 2 diabetes mellitus with insulin therapy ([place]) 10/08/2009  . DYSLIPIDEMIA 10/08/2009  . Essential hypertension 10/08/2009  . Coronary atherosclerosis 10/08/2009  . CAROTID STENOSIS 10/08/2009    Past Surgical History:  Procedure Laterality Date  . APPENDECTOMY     with hysterectomy  . CATARACT EXTRACTION Bilateral 5 years ago  . CHOLECYSTECTOMY    . CORONARY ANGIOPLASTY     prior to 2006 5 stents  . CORONARY  ARTERY BYPASS GRAFT  2006  . I & D of Furuncle  April 2013  . KNEE SURGERY Right   . TOTAL ABDOMINAL HYSTERECTOMY  ~83 years old   complete, with tumor removal  . TOTAL KNEE ARTHROPLASTY Right 08/13/2015   Procedure: RIGHT  TOTAL KNEE ARTHROPLASTY;  Surgeon: Paralee Cancel, MD;  Location: [facility];  Service: Orthopedics;  Laterality: Right;    OB History    Gravida Para Term Preterm AB Living   4 4 4     3    SAB TAB Ectopic Multiple Live Births                   Home Medications    Prior to Admission medications   Medication Sig Start Date End Date Taking? Authorizing Provider  amLODipine (NORVASC) 10 MG tablet Take 1 tablet (10  mg total) by mouth daily. 12/02/16   Wardell Honour, MD  Artificial Tear Ointment (DRY EYES OP) Apply 1 drop to eye daily as needed. For dry eye/ red eye     Historical Provider, MD  atorvastatin (LIPITOR) 20 MG tablet TAKE (1) TABLET DAILY AT BEDTIME. 12/02/16   Wardell Honour, MD  cephALEXin (KEFLEX) 500 MG capsule Take 1 capsule (500 mg total) by mouth 4 (four) times daily. 12/21/16   Fransisca Kaufmann Dettinger, MD  ciprofloxacin (CIPRO) 250 MG tablet Take 1 tablet (250 mg total) by mouth 2 (two) times daily. 12/10/16   Wardell Honour, MD  clopidogrel (PLAVIX) 75 MG tablet Take 1 tablet (75 mg total) by mouth daily. 12/02/16   Wardell Honour, MD  docusate sodium (COLACE) 100 MG capsule Take 1 capsule (100 mg total) by mouth 2 (two) times daily. 12/02/16   Wardell Honour, MD  EASY TOUCH INSULIN SYRINGE 31G X 5/16" 1 ML MISC USE AS DIRECTED. 02/07/15   Chipper Herb, MD  EASY TOUCH PEN NEEDLES 31G X 8 MM MISC AS DIRECTED 04/10/16   Wardell Honour, MD  esomeprazole (NEXIUM) 40 MG capsule Take 1 capsule (40 mg total) by mouth daily. 12/02/16   Wardell Honour, MD  ferrous sulfate 325 (65 FE) MG tablet TAKE (1) TABLET THREE TIMES DAILY AFTER MEALS. 10/05/16   Wardell Honour, MD  fluocinolone (VANOS) 0.01 % cream Apply topically 2 (two) times daily. 11/22/15   [name], MD  furosemide (LASIX) 20 MG tablet Take 1 tablet (20 mg total) by mouth daily as needed for fluid or edema. 12/02/16   Wardell Honour, MD  HUMALOG KWIKPEN 100 UNIT/ML KiwkPen USE PER SLIDING SCALE NOTIFY MD OF RESULTS UNDER 60 OR OVER 300. 02/05/16   Sharion Balloon, FNP  Insulin Pen Needle 31G X 5 MM MISC Use with  Levemir injection 11/14/15   Silver Huguenin Elgergawy, MD  isosorbide mononitrate (IMDUR) 30 MG 24 hr tablet TAKE (1) TABLET BY MOUTH ONCE DAILY. 12/02/16   Wardell Honour, MD  LEVEMIR FLEXTOUCH 100 UNIT/ML Pen INJECT 28 UNITS EVERY MORNING 11/03/16   Sharion Balloon, FNP  linaclotide [facility]) 145 MCG CAPS capsule Take 1  capsule (145 mcg total) by mouth daily. To regulate bowel movements 12/02/16   Wardell Honour, MD  lisinopril (PRINIVIL,ZESTRIL) 10 MG tablet Take 1 tablet (10 mg total) by mouth at bedtime. 11/18/16 02/16/17  Minus Breeding, MD  lisinopril (PRINIVIL,ZESTRIL) 20 MG tablet TAKE (1) TABLET BY MOUTH ONCE DAILY. 12/02/16   Wardell Honour, MD  LORazepam (ATIVAN) 0.5 MG tablet TAKE (1)  TABLET TWICE DAILY as needed for anxiety 12/02/16   Wardell Honour, MD  meclizine (ANTIVERT) 25 MG tablet TAKE 1 TABLET TWICE DAILY AS NEEDED FOR DIZZINESS. 12/02/16   Wardell Honour, MD  metFORMIN (GLUCOPHAGE-XR) 500 MG 24 hr tablet TAKE 1 TABLET WITH BREAKFAST AND 1 TABLET WITH SUPPER. 12/02/16   Wardell Honour, MD  metoprolol (LOPRESSOR) 50 MG tablet TAKE (1) TABLET TWICE DAILY. 12/02/16   Wardell Honour, MD  Multiple Vitamins-Iron (DAILY VITAMINS/IRON/BETA CAROT PO) Take 1 tablet by mouth at bedtime.    Historical Provider, MD  mupirocin ointment (BACTROBAN) 2 % Place 1 application into the nose 2 (two) times daily. 01/06/16   Eustaquio Maize, MD  nitroGLYCERIN (NITROSTAT) 0.4 MG SL tablet PLACE ONE (1) TABLET UNDER TONGUE EVERY 5 MINUTES UP TO (3) DOSES AS NEEDED FOR CHEST PAIN. 09/04/16   Sharion Balloon, FNP  PARoxetine (PAXIL) 20 MG tablet Take 1 tablet (20 mg total) by mouth daily. 12/02/16   Wardell Honour, MD  polyethylene glycol [facility] Floria Raveling) packet Take 17 g by mouth 2 (two) times daily. Patient taking differently: Take 17 g by mouth daily as needed for mild constipation.  08/15/15   Danae Orleans, PA-C  traMADol (ULTRAM) 50 MG tablet Take 1 tablet (50 mg total) by mouth every 6 (six) hours as needed. 12/21/16   Fransisca Kaufmann Dettinger, MD    Family History Family History  Problem Relation Age of Onset  . Cancer Brother     porstate  . Early death Sister   . Heart disease Brother   . Heart disease Brother   . Heart attack Brother   . Heart disease Brother   . Heart attack Brother   .  Diabetes Sister   . Heart attack Sister   . Diabetes Sister   . Osteoporosis Sister   . Hypertension Sister     Social History Social History  Substance Use Topics  . Smoking status: Never Smoker  . Smokeless tobacco: Never Used  . Alcohol use No     Allergies   Advil [ibuprofen]; Heparin; and Propoxyphene n-acetaminophen   Review of Systems Review of Systems A complete 10 system review of systems was obtained and all systems are negative except as noted in the HPI and PMH.   Physical Exam Updated Vital Signs BP 165/67   Pulse 71   Temp 97.5 F (36.4 C) (Oral)   Resp 16   Ht 5\' 6"  (1.676 m)   Wt 155 lb (70.3 kg)   SpO2 99%   BMI 25.02 kg/m   Physical Exam  Constitutional: She is oriented to person, place, and time. She appears well-developed and well-nourished. No distress.  HENT:  Head: Normocephalic and atraumatic.  Eyes: EOM are normal.  Neck: Normal range of motion.  Cardiovascular: Normal rate, regular rhythm and normal heart sounds.   Pulmonary/Chest: Effort normal and breath sounds normal.  Abdominal: Soft. She exhibits no distension. There is no tenderness.  Musculoskeletal: Normal range of motion.  Left CVA tenderness.  Neurological: She is alert and oriented to person, place, and time.  Skin: Skin is warm and dry. No rash noted.  No left flank rash noted.  Psychiatric: She has a normal mood and affect. Judgment normal.  Nursing note and vitals reviewed.    ED Treatments / Results  DIAGNOSTIC STUDIES: Oxygen Saturation is 98% on RA, normal by my interpretation.    COORDINATION OF CARE: 10:20 AM Discussed treatment plan with  pt at bedside and pt agreed to plan.  Labs (all labs ordered are listed, but only abnormal results are displayed) Labs Reviewed  COMPREHENSIVE METABOLIC PANEL - Abnormal; Notable for the following:       Result Value   Glucose, Bld 201 (*)    BUN 25 (*)    Creatinine, Ser 1.09 (*)    ALT 11 (*)    GFR calc non Af  Amer 46 (*)    GFR calc Af Amer 53 (*)    All other components within normal limits  CBC - Abnormal; Notable for the following:    RBC 3.72 (*)    Hemoglobin 11.1 (*)    HCT 31.9 (*)    All other components within normal limits  URINALYSIS, ROUTINE W REFLEX MICROSCOPIC - Abnormal; Notable for the following:    APPearance HAZY (*)    Specific Gravity, Urine >1.030 (*)    Glucose, UA 100 (*)    Hgb urine dipstick MODERATE (*)    Protein, ur 100 (*)    Leukocytes, UA SMALL (*)    All other components within normal limits  URINALYSIS, MICROSCOPIC (REFLEX) - Abnormal; Notable for the following:    Bacteria, UA MANY (*)    All other components within normal limits  URINE CULTURE    EKG  EKG Interpretation None       Radiology Ct Renal Stone Study  Result Date: 12/27/2016 CLINICAL DATA:  Left flank pain radiating to with the left groin. EXAM: CT ABDOMEN AND PELVIS WITHOUT CONTRAST TECHNIQUE: Multidetector CT imaging of the abdomen and pelvis was performed following the standard protocol without IV contrast. COMPARISON:  11/22/2016 FINDINGS: Lower chest: Prior CABG. Dense mitral annular calcification. Coronary artery atherosclerosis partially visualized. Lung bases are clear. Hepatobiliary: No focal liver abnormality is seen. Status post cholecystectomy. No biliary dilatation. Pancreas: Unremarkable. No pancreatic ductal dilatation or surrounding inflammatory changes. Spleen: Normal in size without focal abnormality. Adrenals/Urinary Tract: Adrenal glands are unremarkable. Kidneys are normal, without renal calculi, focal lesion, or hydronephrosis. Bladder is decompressed. Stomach/Bowel: Stomach is within normal limits. No evidence of bowel wall thickening, distention, or inflammatory changes. Moderate amount of stool in the colon. Vascular/Lymphatic: Abdominal aortic atherosclerosis. No lymphadenopathy. Reproductive: Status post hysterectomy. No adnexal masses. Other: No fluid collection or  hematoma. Multiple pelvic phleboliths. Musculoskeletal: Osteoarthritis bilateral sacroiliac joints. Chronic compression fractures of the L1 and L3 vertebral bodies. Diffuse lumbar spine facet arthropathy. S-shaped scoliosis of the thoracolumbar spine. IMPRESSION: 1. No urolithiasis or obstructive uropathy. 2. Moderate amount of stool in the colon. 3.  Aortic Atherosclerosis (ICD10-170.0) Electronically Signed   By: Kathreen Devoid   On: 12/27/2016 12:06    Procedures Procedures (including critical care time)  Medications Ordered in ED Medications  morphine 4 MG/ML injection 4 mg (4 mg Intravenous Given 12/27/16 1040)     Initial Impression / Assessment and Plan / ED Course  I have reviewed the triage vital signs and the nursing notes.  Pertinent labs & imaging results that were available during my care of the patient were reviewed by me and considered in my medical decision making (see chart for details).     12:26 PM Patient feels better this time.  Symptom improvement.  CT scan without obstructing uropathy.  Urine still looks infected.  Patient be switched from Keflex to ciprofloxacin.  New urine culture sent.  Patient understands to return to the ER for new or worsening symptoms.  Family feels comfortable taking her home at  this time.  Close primary care follow-up.  Patient and family understand to return to the emergency department for new or worsening symptoms  Final Clinical Impressions(s) / ED Diagnoses   Final diagnoses:  None    New Prescriptions New Prescriptions   No medications on file   I personally performed the services described in this documentation, which was scribed in my presence. The recorded information has been reviewed and is accurate.       Jola Schmidt, MD 12/27/16 1227

## 2016-12-27 NOTE — ED Triage Notes (Signed)
Pt comes in with family who state pt began talking out of her head yesterday morning. Upon triage, pt was saying things like, "give me the shotgun so I can go kill that rabbit." Pt had a UTI around 3 weeks ago. Family states pt has an abscess on her back that had started last Sunday (it has been draining)

## 2016-12-29 LAB — URINE CULTURE: Culture: NO GROWTH

## 2016-12-30 LAB — ANAEROBIC AND AEROBIC CULTURE

## 2016-12-31 ENCOUNTER — Ambulatory Visit (INDEPENDENT_AMBULATORY_CARE_PROVIDER_SITE_OTHER): Payer: Medicare Other | Admitting: Family Medicine

## 2016-12-31 ENCOUNTER — Encounter: Payer: Self-pay | Admitting: Family Medicine

## 2016-12-31 VITALS — BP 164/76 | HR 62 | Temp 96.8°F | Ht 66.0 in | Wt 154.4 lb

## 2016-12-31 DIAGNOSIS — R300 Dysuria: Secondary | ICD-10-CM | POA: Diagnosis not present

## 2016-12-31 DIAGNOSIS — K5900 Constipation, unspecified: Secondary | ICD-10-CM | POA: Diagnosis not present

## 2016-12-31 DIAGNOSIS — R1032 Left lower quadrant pain: Secondary | ICD-10-CM | POA: Diagnosis not present

## 2016-12-31 LAB — URINALYSIS, COMPLETE
Bilirubin, UA: NEGATIVE
KETONES UA: NEGATIVE
Nitrite, UA: NEGATIVE
SPEC GRAV UA: 1.025 (ref 1.005–1.030)
Urobilinogen, Ur: 0.2 mg/dL (ref 0.2–1.0)
pH, UA: 5.5 (ref 5.0–7.5)

## 2016-12-31 LAB — MICROSCOPIC EXAMINATION
Epithelial Cells (non renal): >10 /hpf — AB (ref 0–10)
RENAL EPITHEL UA: NONE SEEN /HPF

## 2016-12-31 MED ORDER — METRONIDAZOLE 500 MG PO TABS: 500.0000 mg | ORAL_TABLET | Freq: Three times a day (TID) | ORAL | 0 refills | Status: DC

## 2016-12-31 NOTE — Patient Instructions (Signed)
Great to see you!  Try flagyl 1 pill three times daily.   We will call with lab and urine results as soon as we have them.

## 2016-12-31 NOTE — Progress Notes (Signed)
   HPI  Patient presents today here for ER follow-up with dysuria, constipation, left lower quadrant pain.  She was seen in the emergency room days ago and treated for UTI with ciprofloxacin. She was previously started on Keflex which was discontinued. She's had improvement in confusion since the emergency room visit. She continues to complain of left lower quadrant pain, left hip pain, left low back pain.  Depression has not had any fevers, chills, sweats. She is tolerating food and fluids normally. She complains of several things but mostly left hip pain.  She has been taking Cipro without a problem. Could not give urine today for repeat urine test which she requests.  PMH: Smoking status noted ROS: Per HPI  Objective: BP (!) 164/76   Pulse 62   Temp (!) 96.8 F (36 C) (Oral)   Ht _0  (1.676 m)   Wt 154 lb 6.4 oz (70 kg)   BMI 24.92 kg/m  Gen: NAD, alert, cooperative with exam HEENT: NCAT CV: RRR, good S1/S2, no murmur Resp: CTABL, no wheezes, non-labored Abd: Soft, no guarding, positive bowel sounds, left lower quadrant tenderness to palpation Ext: No edema, warm Neuro: Alert and oriented, No gross deficits  MSK:  TTP at L sided paraspinal muscles in the low back.  Tenderness over the L greater troch Tenderness with flexion and abduction of L hip  Assessment and plan:  # Dysuria Improved, Continue cipro Repeat culture, previous negative No signs of urosepsis  # LLQ pain Possible underlying diverticulitis, add flagyl to cipro.  PT tolerating food and fluids well, no fevers.  Abd exam with tenderness but no guarding.  Overall imporoved  # Constipation  Possible that this is underlying etiology and root of the problem. Start Miralax, ok with short time of sennakot if needed     Orders Placed This Encounter  Procedures  . Urine culture  . Urinalysis, Complete  . CBC with Differential/Platelet  . CMP14+EGFR    Meds ordered this encounter    Medications  . metroNIDAZOLE (FLAGYL) 500 MG tablet    Sig: Take 1 tablet (500 mg total) by mouth 3 (three) times daily.    Dispense:  21 tablet    Refill:  0    Laroy Apple, MD [facility] 12/31/2016, 9:31 AM

## 2017-01-01 LAB — CBC WITH DIFFERENTIAL/PLATELET
BASOS: 1 %
Basophils Absolute: 0 10*3/uL (ref 0.0–0.2)
EOS (ABSOLUTE): 0.1 10*3/uL (ref 0.0–0.4)
Eos: 2 %
HEMATOCRIT: 36.3 % (ref 34.0–46.6)
HEMOGLOBIN: 11.7 g/dL (ref 11.1–15.9)
IMMATURE GRANS (ABS): 0 10*3/uL (ref 0.0–0.1)
Immature Granulocytes: 0 %
LYMPHS ABS: 1.3 10*3/uL (ref 0.7–3.1)
LYMPHS: 24 %
MCH: 28.4 pg (ref 26.6–33.0)
MCHC: 32.2 g/dL (ref 31.5–35.7)
MCV: 88 fL (ref 79–97)
MONOCYTES: 6 %
Monocytes Absolute: 0.3 10*3/uL (ref 0.1–0.9)
NEUTROS ABS: 3.6 10*3/uL (ref 1.4–7.0)
Neutrophils: 67 %
Platelets: 201 10*3/uL (ref 150–379)
RBC: 4.12 x10E6/uL (ref 3.77–5.28)
RDW: 15.5 % — ABNORMAL HIGH (ref 12.3–15.4)
WBC: 5.3 10*3/uL (ref 3.4–10.8)

## 2017-01-01 LAB — CMP14+EGFR
ALT: 12 IU/L (ref 0–32)
AST: 11 IU/L (ref 0–40)
Albumin/Globulin Ratio: 1.4 (ref 1.2–2.2)
Albumin: 3.6 g/dL (ref 3.5–4.7)
Alkaline Phosphatase: 50 IU/L (ref 39–117)
BUN/Creatinine Ratio: 19 (ref 12–28)
BUN: 21 mg/dL (ref 8–27)
Bilirubin Total: 0.3 mg/dL (ref 0.0–1.2)
CO2: 22 mmol/L (ref 18–29)
CREATININE: 1.09 mg/dL — AB (ref 0.57–1.00)
Calcium: 9.6 mg/dL (ref 8.7–10.3)
Chloride: 101 mmol/L (ref 96–106)
GFR, EST AFRICAN AMERICAN: 54 mL/min/{1.73_m2} — AB (ref >59)
GFR, EST NON AFRICAN AMERICAN: 47 mL/min/{1.73_m2} — AB (ref >59)
GLUCOSE: 265 mg/dL — AB (ref 65–99)
Globulin, Total: 2.6 g/dL (ref 1.5–4.5)
Potassium: 4.4 mmol/L (ref 3.5–5.2)
Sodium: 138 mmol/L (ref 134–144)
TOTAL PROTEIN: 6.2 g/dL (ref 6.0–8.5)

## 2017-01-02 LAB — URINE CULTURE

## 2017-01-04 ENCOUNTER — Other Ambulatory Visit: Payer: Self-pay | Admitting: Family Medicine

## 2017-01-04 DIAGNOSIS — L723 Sebaceous cyst: Secondary | ICD-10-CM

## 2017-01-08 ENCOUNTER — Telehealth: Payer: Self-pay | Admitting: Pharmacist

## 2017-01-08 DIAGNOSIS — M7918 Myalgia, other site: Secondary | ICD-10-CM

## 2017-01-08 DIAGNOSIS — R2681 Unsteadiness on feet: Secondary | ICD-10-CM

## 2017-01-08 NOTE — Telephone Encounter (Signed)
Not sure where the request for dry needling originated but it may help so okay to refer

## 2017-01-11 NOTE — Telephone Encounter (Signed)
Order for PT sent

## 2017-01-12 DIAGNOSIS — E1142 Type 2 diabetes mellitus with diabetic polyneuropathy: Secondary | ICD-10-CM | POA: Diagnosis not present

## 2017-01-12 DIAGNOSIS — L84 Corns and callosities: Secondary | ICD-10-CM | POA: Diagnosis not present

## 2017-01-12 DIAGNOSIS — B351 Tinea unguium: Secondary | ICD-10-CM | POA: Diagnosis not present

## 2017-01-18 ENCOUNTER — Encounter: Payer: Self-pay | Admitting: Physical Therapy

## 2017-01-18 ENCOUNTER — Ambulatory Visit: Payer: Medicare Other | Attending: Family Medicine | Admitting: Physical Therapy

## 2017-01-18 DIAGNOSIS — R2681 Unsteadiness on feet: Secondary | ICD-10-CM | POA: Diagnosis not present

## 2017-01-18 DIAGNOSIS — M25552 Pain in left hip: Secondary | ICD-10-CM | POA: Diagnosis not present

## 2017-01-18 DIAGNOSIS — M545 Low back pain, unspecified: Secondary | ICD-10-CM

## 2017-01-18 NOTE — Patient Instructions (Signed)
Trigger Point Dry Needling  . What is Trigger Point Dry Needling (DN)? o DN is a physical therapy technique used to treat muscle pain and dysfunction. Specifically, DN helps deactivate muscle trigger points (muscle knots).  o A thin filiform needle is used to penetrate the skin and stimulate the underlying trigger point. The goal is for a local twitch response (LTR) to occur and for the trigger point to relax. No medication of any kind is injected during the procedure.   . What Does Trigger Point Dry Needling Feel Like?  o The procedure feels different for each individual patient. Some patients report that they do not actually feel the needle enter the skin and overall the process is not painful. Very mild bleeding may occur. However, many patients feel a deep cramping in the muscle in which the needle was inserted. This is the local twitch response.   Marland Kitchen How Will I feel after the treatment? o Soreness is normal, and the onset of soreness may not occur for a few hours. Typically this soreness does not last longer than two days.  o Bruising is uncommon, however; ice can be used to decrease any possible bruising.  o In rare cases feeling tired or nauseous after the treatment is normal. In addition, your symptoms may get worse before they get better, this period will typically not last longer than 24 hours.   . What Can I do After My Treatment? o Increase your hydration by drinking more water for the next 24 hours. o You may place ice or heat on the areas treated that have become sore, however, do not use heat on inflamed or bruised areas. Heat often brings more relief post needling. o You can continue your regular activities, but vigorous activity is not recommended initially after the treatment for 24 hours. o DN is best combined with other physical therapy such as strengthening, stretching, and other therapies.    Precautions:  In some cases, dry needling is done over the lung field. While rare,  there is a risk of pneumothorax (punctured lung). Because of this, if you ever experience shortness of breath on exertion, difficulty taking a deep breath, chest pain or a dry cough following dry needling, you should report to an emergency room and tell them that you have been dry needled over the thorax.  [name], PT 01/18/17 10:25 AM [name] [address] [place], [place], [address] Phone: [phone]   Fax:  [phone]

## 2017-01-18 NOTE — Therapy (Signed)
[facility] [place], [place], [address] Phone: [phone]   Fax:  [phone]  Physical Therapy Evaluation  Patient Details  Name: [name] MRN: [id number] Date of Birth: 05-20-1931 Referring Provider: Kenn File  Encounter Date: 01/18/2017      PT End of Session - 01/18/17 0952    Visit Number 1   Number of Visits 16   Date for PT Re-Evaluation 03/15/17   PT Start Time 0953   PT Stop Time 1039   PT Time Calculation (min) 46 min   Activity Tolerance Patient limited by pain   Behavior During Therapy [facility] for tasks assessed/performed      Past Medical History:  Diagnosis Date  . Anemia   . Anxiety   . Arthritis    back, knees, and hips  . Asthma    with allergies  . Balance problems   . CAD (coronary artery disease)   . Carotid stenosis   . CHF (congestive heart failure) ([place])    EF preserved Echo 2012  . Chronic kidney infection   . 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 Dementia    "a little"  . Depression   . Diabetes mellitus    type 2  . Furuncle of back, except buttock   . GERD (gastroesophageal reflux disease)   . H/O seasonal allergies   . Hearing loss   . Hypertension   . Iron deficiency anemia 05/27/2011  . Myocardial infarction   . Pneumonia    hx of  . Renal insufficiency   . Shortness of breath dyspnea    with activity  . Speech problem    from stroke  . Swallowing difficulty   . Vertigo    "when sugar gets low"  . Vision loss    left eye-"half blind"    Past Surgical History:  Procedure Laterality Date  . APPENDECTOMY     with hysterectomy  . CATARACT EXTRACTION Bilateral 5 years ago  . CHOLECYSTECTOMY    . CORONARY ANGIOPLASTY     prior to 2006 5 stents  . CORONARY ARTERY BYPASS GRAFT  2006  . I & D of Furuncle  April 2013  . KNEE SURGERY Right   . TOTAL ABDOMINAL  HYSTERECTOMY  ~83 years old   complete, with tumor removal  . TOTAL KNEE ARTHROPLASTY Right 08/13/2015   Procedure: RIGHT  TOTAL KNEE ARTHROPLASTY;  Surgeon: Paralee Cancel, MD;  Location: [facility];  Service: Orthopedics;  Laterality: Right;    There were no vitals filed for this visit.       Subjective Assessment - 01/18/17 0952    Subjective Patient fell 5 weeks ago and xrays at [facility] showed her tailbone was fractured per daughter. She also has chronic L1-3 compression fractures per MRI in 12/2016. She now has pain sitting, lying down and rolling over in bed. Pain wraps around her into her left abdomen.    Patient is accompained by: Family member  daughter   Pertinent History HTN, DM, asthma, osteoporosis, TKR   Limitations Sitting   How long can you sit comfortably? 1 hour depending on surface   Diagnostic tests MRI, CT scan and xrays taken at [facility] per daughter   Patient Stated Goals get rid of pain   Currently in Pain? Yes   Pain Score 9    Pain Location Buttocks   Pain Orientation  Left   Pain Descriptors / Indicators Sharp   Pain Type Acute pain   Pain Onset More than a month ago   Pain Frequency Constant   Aggravating Factors  sitting, moving in bed   Pain Relieving Factors meds, icy hot, hot water bottle   Effect of Pain on Daily Activities limited with ambulation            [facility] PT Assessment - 01/18/17 0001      Assessment   Medical Diagnosis Gait instability, buttock pain   Referring Provider Kenn File   Onset Date/Surgical Date 12/12/16   Next MD Visit 03/12/17   Prior Therapy yes     Precautions   Precautions Fall   Precaution Comments FALL RISK; BE WITH PATIENT AT ALL TIMES     Restrictions   Weight Bearing Restrictions No     Balance Screen   Has the patient fallen in the past 6 months Yes   How many times? 3   Has the patient had a decrease in activity level because of a fear of falling?  Yes   Is the patient reluctant to leave their  home because of a fear of falling?  No     Home Environment   Living Environment Private residence   Living Arrangements Children   Type of [place] One level   [place] - single point  triangle walker     Prior Function   Level of [place] Independent with household mobility with device   Vocation Retired     Observation/Other Assessments   Focus on Therapeutic Outcomes (FOTO)  74% limited     Posture/Postural Control   Posture Comments rounded shoulders, stands with more weight on RLE, stands in lumbar flexion.     ROM / Strength   AROM / PROM / Strength Strength;AROM     AROM   Overall AROM Comments Patient unable to stand in neutral lumbar spine. Stands in approximately 20 deg flexion. Able to flex forward hands to knees.     Strength   Overall Strength Comments B hip flex 3+/5;  pain with resisted R hip flex, knee ext 5/5 B, flex 4/5 B. seated hip ABD 5/5, ADD 5/5 no pain noted.     Palpation   Palpation comment L QL, lumbar paraspinals, gluteals, ischial tub, L ITB; R side unremarkable.     Ambulation/Gait   Ambulation/Gait Yes   Ambulation/Gait Assistance 5: Supervision   Ambulation Distance (Feet) 25 Feet   Assistive device Rolling walker  triangle walker   Gait Pattern Decreased stance time - left;Decreased step length - right;Decreased step length - left   Ambulation Surface Level   Gait Comments patient states she does not like to lock the triangle walker which makes it unsafe as it closes and opens with ambulation.                           PT Education - 01/18/17 1055    Education provided Yes   Education Details DN education    Person(s) Educated Patient;Child(ren)   Methods Explanation;Handout   Comprehension Verbalized understanding          PT Short Term Goals - 01/18/17 1103      PT SHORT TERM GOAL #1   Title Patient to report decreased pain in L buttocks and back to 5/10 or less with ADLS.  02/15/17   Time 4  Period Weeks   Status New     PT SHORT TERM GOAL #2   Title Patient able to peform supine to sit with 5/10 pain or less. (02/15/17)   Time 4   Period Weeks   Status New     PT SHORT TERM GOAL #3   Title I with initial HEP (02/15/17)   Time 4   Period Weeks   Status New           PT Long Term Goals - 01/18/17 1105      PT LONG TERM GOAL #1   Title I   Time 8   Status New     PT LONG TERM GOAL #2   Title Patient able to peform ADLS with 4/09 pain or less in the left buttock and back.   Time 8   Period Weeks   Status New     PT LONG TERM GOAL #3   Title Patient to demo improved B hip flexion to 4+/5 or better to improve function   Time 8   Period Weeks   Status New     PT LONG TERM GOAL #4   Title Patient able to ambulate in clinic safely 350 ft with least restrictive AD and no LOB.   Time 8   Period Weeks   Status New               Plan - 01/18/17 1056    Clinical Impression Statement Patient presents today with significant pain in the left buttock secondary to a fall in January. Her daughter reports that the patient's tailbone was fractured, but no imaging is in chart to support that. Patient does have chronic compression fractures of L1-3 and B SIJ OA. Pain is limiting patient's ability to walk as well as bed mobility. She was unable to tolerated lying supine during eval today. Estim was administered with heat in sitting and patient tolerated fair. Prescription was also for gait instabilty, however, pain did not allow for full assessment of balance/gait today. Limitations and comorbidities make this a mod complexity eval.   Rehab Potential Good   PT Frequency 2x / week   PT Duration 8 weeks   PT Treatment/Interventions ADLs/Self Care Home Management;Cryotherapy;Air traffic controller;Therapeutic activities;Therapeutic exercise;Balance training;Neuromuscular re-education;Manual techniques;Patient/family  education;Dry needling   PT Next Visit Plan Manual therapy to Left low back and buttocks as tolerated; modalities prn; DN to low back and gluteals as indicated. Pain free therex.  Progress to balance/gait once pain tolerable.   Consulted and Agree with Plan of Care Patient;Family member/caregiver      Patient will benefit from skilled therapeutic intervention in order to improve the following deficits and impairments:  Pain, Abnormal gait, Postural dysfunction, Decreased strength, Decreased range of motion  Visit Diagnosis: Pain in left hip - Plan: PT plan of care cert/re-cert  Acute left-sided low back pain without sciatica - Plan: PT plan of care cert/re-cert  Unsteadiness on feet - Plan: PT plan of care cert/re-cert      G-Codes - 81/19/14 1109    Functional Limitation Mobility: Walking and moving around   Mobility: Walking and Moving Around Current Status [phone]) At least 60 percent but less than 80 percent impaired, limited or restricted   Mobility: Walking and Moving Around Goal Status [phone]) At least 40 percent but less than 60 percent impaired, limited or restricted       Problem List Patient Active Problem List   Diagnosis Date Noted  . Overweight (  BMI 25.0-29.9) 05/08/2016  . UTI (lower urinary tract infection) 11/12/2015  . DM hyperosmolarity type II, uncontrolled ([place]) 11/12/2015  . Dementia   . Encephalopathy, metabolic 79/48/0165  . S/P right TKA 08/13/2015  . S/P knee replacement 08/13/2015  . Elevated troponin 03/06/2015  . Fever   . History of diabetes mellitus   . Left buttock pain   . Hyperlipidemia 01/11/2015  . GAD (generalized anxiety disorder) 01/11/2015  . Depression 01/11/2015  . GERD (gastroesophageal reflux disease) 01/11/2015  . Osteopenia 07/13/2014  . Obesity (BMI 30-39.9) 06/19/2014  . Carotid stenosis   . Anemia of chronic disease 12/25/2011  . Iron deficiency anemia 05/27/2011  . Diastolic CHF, chronic ([place]) 04/02/2010  . Type 2  diabetes mellitus with insulin therapy ([place]) 10/08/2009  . DYSLIPIDEMIA 10/08/2009  . Essential hypertension 10/08/2009  . Coronary atherosclerosis 10/08/2009  . CAROTID STENOSIS 10/08/2009    Madelyn Flavors PT 01/18/2017, 12:18 PM  [facility] [address] [place], [place], [address] Phone: [phone]   Fax:  [phone]  Name: [name] MRN: [id number] Date of Birth: [birth year]

## 2017-01-20 ENCOUNTER — Encounter: Payer: Self-pay | Admitting: Physical Therapy

## 2017-01-20 ENCOUNTER — Ambulatory Visit: Payer: Medicare Other | Admitting: Physical Therapy

## 2017-01-20 DIAGNOSIS — M545 Low back pain, unspecified: Secondary | ICD-10-CM

## 2017-01-20 DIAGNOSIS — M25552 Pain in left hip: Secondary | ICD-10-CM

## 2017-01-20 DIAGNOSIS — R2681 Unsteadiness on feet: Secondary | ICD-10-CM

## 2017-01-20 NOTE — Therapy (Signed)
[facility] [place], [place], [address] Phone: [phone]   Fax:  [phone]  Physical Therapy Treatment  Patient Details  Name: [name] MRN: [id number] Date of Birth: 1931-09-11 Referring Provider: Kenn File  Encounter Date: 01/20/2017      PT End of Session - 01/20/17 0733    Visit Number 2   Number of Visits 16   Date for PT Re-Evaluation 03/15/17   PT Start Time 0733   PT Stop Time 0822   PT Time Calculation (min) 49 min   Activity Tolerance Patient tolerated treatment well   Behavior During Therapy [facility] for tasks assessed/performed      Past Medical History:  Diagnosis Date  . Anemia   . Anxiety   . Arthritis    back, knees, and hips  . Asthma    with allergies  . Balance problems   . CAD (coronary artery disease)   . Carotid stenosis   . CHF (congestive heart failure) ([place])    EF preserved Echo 2012  . Chronic kidney infection   . 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 Dementia    "a little"  . Depression   . Diabetes mellitus    type 2  . Furuncle of back, except buttock   . GERD (gastroesophageal reflux disease)   . H/O seasonal allergies   . Hearing loss   . Hypertension   . Iron deficiency anemia 05/27/2011  . Myocardial infarction   . Pneumonia    hx of  . Renal insufficiency   . Shortness of breath dyspnea    with activity  . Speech problem    from stroke  . Swallowing difficulty   . Vertigo    "when sugar gets low"  . Vision loss    left eye-"half blind"    Past Surgical History:  Procedure Laterality Date  . APPENDECTOMY     with hysterectomy  . CATARACT EXTRACTION Bilateral 5 years ago  . CHOLECYSTECTOMY    . CORONARY ANGIOPLASTY     prior to 2006 5 stents  . CORONARY ARTERY BYPASS GRAFT  2006  . I & D of Furuncle  April 2013  . KNEE SURGERY Right   . TOTAL ABDOMINAL  HYSTERECTOMY  ~83 years old   complete, with tumor removal  . TOTAL KNEE ARTHROPLASTY Right 08/13/2015   Procedure: RIGHT  TOTAL KNEE ARTHROPLASTY;  Surgeon: Paralee Cancel, MD;  Location: [facility];  Service: Orthopedics;  Laterality: Right;    There were no vitals filed for this visit.      Subjective Assessment - 01/20/17 0731    Subjective Daughter reports that she is not hollering in pain when she sits down. Patient reports that she feels a little better today.   Pertinent History HTN, DM, asthma, osteoporosis, TKR   Limitations Sitting   How long can you sit comfortably? 1 hour depending on surface   Diagnostic tests MRI, CT scan and xrays taken at [facility] per daughter   Currently in Pain? Yes   Pain Score 6    Pain Location Back   Pain Orientation Left;Lower   Pain Descriptors / Indicators Throbbing   Pain Type Acute pain   Pain Onset More than a month ago   Pain Frequency Constant            [facility] PT Assessment - 01/20/17 0001  Assessment   Medical Diagnosis Gait instability, buttock pain   Onset Date/Surgical Date 12/12/16   Next MD Visit 03/12/17   Prior Therapy yes     Precautions   Precautions Fall   Precaution Comments FALL RISK; BE WITH PATIENT AT ALL TIMES     Restrictions   Weight Bearing Restrictions No                     [facility] Adult PT Treatment/Exercise - 01/20/17 0001      Modalities   Modalities Electrical Stimulation;Moist Heat;Ultrasound     Moist Heat Therapy   Number Minutes Moist Heat 15 Minutes   Moist Heat Location Lumbar Spine     Electrical Stimulation   Electrical Stimulation Location L low back   Electrical Stimulation Action IFC   Electrical Stimulation Parameters 80-150 hz x15 min   Electrical Stimulation Goals Pain     Ultrasound   Ultrasound Location L lumbar paraspinals/ QL   Ultrasound Parameters 1.5 w/cm2, 100%, 1 mhzx10 min   Ultrasound Goals Pain     Manual Therapy   Manual Therapy Soft tissue  mobilization   Soft tissue mobilization STW to L lumbar paraspinals, QL, glute to reduce tightness and pain in R sidelying                  PT Short Term Goals - 01/18/17 1103      PT SHORT TERM GOAL #1   Title Patient to report decreased pain in L buttocks and back to 5/10 or less with ADLS. 02/15/17   Time 4   Period Weeks   Status New     PT SHORT TERM GOAL #2   Title Patient able to peform supine to sit with 5/10 pain or less. (02/15/17)   Time 4   Period Weeks   Status New     PT SHORT TERM GOAL #3   Title I with initial HEP (02/15/17)   Time 4   Period Weeks   Status New           PT Long Term Goals - 01/18/17 1105      PT LONG TERM GOAL #1   Title I   Time 8   Status New     PT LONG TERM GOAL #2   Title Patient able to peform ADLS with 1/77 pain or less in the left buttock and back.   Time 8   Period Weeks   Status New     PT LONG TERM GOAL #3   Title Patient to demo improved B hip flexion to 4+/5 or better to improve function   Time 8   Period Weeks   Status New     PT LONG TERM GOAL #4   Title Patient able to ambulate in clinic safely 350 ft with least restrictive AD and no LOB.   Time 8   Period Weeks   Status New               Plan - 01/20/17 0813    Clinical Impression Statement Patient presented with daughter in clinic with reports of improvement in regards to pain assessment today. Patient continues to use triangle rollator at this time. Patient presented with TPs and tightness throughout L lumbar paraspinals and into L hip upon palpation. Patient reported soreness with manual therapy especially in L lumbar paraspinals. Patient experiences pain from L lateral flank and into anterior trunk and into groin as well as down L  lateral thigh per patient report. Normal modalities response noted following removal of the modalities. Patient experienced pain with bed mobility such as supine to sit in which log rolling technique utilized and  also with sit to R sidelying.   Rehab Potential Good   PT Frequency 2x / week   PT Duration 8 weeks   PT Treatment/Interventions ADLs/Self Care Home Management;Cryotherapy;Air traffic controller;Therapeutic activities;Therapeutic exercise;Balance training;Neuromuscular re-education;Manual techniques;Patient/family education;Dry needling   PT Next Visit Plan Manual therapy to Left low back and buttocks as tolerated; modalities prn; DN to low back and gluteals as indicated. Pain free therex.  Progress to balance/gait once pain tolerable.   Consulted and Agree with Plan of Care Patient;Family member/caregiver   Family Member Consulted Daughter      Patient will benefit from skilled therapeutic intervention in order to improve the following deficits and impairments:  Pain, Abnormal gait, Postural dysfunction, Decreased strength, Decreased range of motion  Visit Diagnosis: Pain in left hip  Acute left-sided low back pain without sciatica  Unsteadiness on feet     Problem List Patient Active Problem List   Diagnosis Date Noted  . Overweight (BMI 25.0-29.9) 05/08/2016  . UTI (lower urinary tract infection) 11/12/2015  . DM hyperosmolarity type II, uncontrolled ([place]) 11/12/2015  . Dementia   . Encephalopathy, metabolic 27/02/5008  . S/P right TKA 08/13/2015  . S/P knee replacement 08/13/2015  . Elevated troponin 03/06/2015  . Fever   . History of diabetes mellitus   . Left buttock pain   . Hyperlipidemia 01/11/2015  . GAD (generalized anxiety disorder) 01/11/2015  . Depression 01/11/2015  . GERD (gastroesophageal reflux disease) 01/11/2015  . Osteopenia 07/13/2014  . Obesity (BMI 30-39.9) 06/19/2014  . Carotid stenosis   . Anemia of chronic disease 12/25/2011  . Iron deficiency anemia 05/27/2011  . Diastolic CHF, chronic ([place]) 04/02/2010  . Type 2 diabetes mellitus with insulin therapy ([place]) 10/08/2009  . DYSLIPIDEMIA 10/08/2009  .  Essential hypertension 10/08/2009  . Coronary atherosclerosis 10/08/2009  . CAROTID STENOSIS 10/08/2009    Wynelle Fanny, PTA 01/20/2017, 8:58 AM  [facility] [address] [place], [place], [address] Phone: [phone]   Fax:  [phone]  Name: [name] MRN: [id number] Date of Birth: 06/09/1931

## 2017-01-22 ENCOUNTER — Telehealth: Payer: Self-pay | Admitting: Family Medicine

## 2017-01-22 ENCOUNTER — Encounter (HOSPITAL_COMMUNITY): Payer: Self-pay | Admitting: Emergency Medicine

## 2017-01-22 ENCOUNTER — Emergency Department (HOSPITAL_COMMUNITY)
Admission: EM | Admit: 2017-01-22 | Discharge: 2017-01-22 | Disposition: A | Discharge status: Home / Self Care | Payer: Medicare Other | Attending: Emergency Medicine | Admitting: Emergency Medicine

## 2017-01-22 ENCOUNTER — Emergency Department (HOSPITAL_COMMUNITY): Payer: Medicare Other

## 2017-01-22 DIAGNOSIS — R42 Dizziness and giddiness: Secondary | ICD-10-CM | POA: Diagnosis not present

## 2017-01-22 DIAGNOSIS — E1122 Type 2 diabetes mellitus with diabetic chronic kidney disease: Secondary | ICD-10-CM | POA: Diagnosis not present

## 2017-01-22 DIAGNOSIS — I2510 Atherosclerotic heart disease of native coronary artery without angina pectoris: Secondary | ICD-10-CM | POA: Insufficient documentation

## 2017-01-22 DIAGNOSIS — I5032 Chronic diastolic (congestive) heart failure: Secondary | ICD-10-CM | POA: Insufficient documentation

## 2017-01-22 DIAGNOSIS — J45909 Unspecified asthma, uncomplicated: Secondary | ICD-10-CM | POA: Diagnosis not present

## 2017-01-22 DIAGNOSIS — N189 Chronic kidney disease, unspecified: Secondary | ICD-10-CM | POA: Diagnosis not present

## 2017-01-22 DIAGNOSIS — I252 Old myocardial infarction: Secondary | ICD-10-CM | POA: Insufficient documentation

## 2017-01-22 DIAGNOSIS — Z79899 Other long term (current) drug therapy: Secondary | ICD-10-CM | POA: Insufficient documentation

## 2017-01-22 DIAGNOSIS — Z794 Long term (current) use of insulin: Secondary | ICD-10-CM | POA: Insufficient documentation

## 2017-01-22 DIAGNOSIS — I130 Hypertensive heart and chronic kidney disease with heart failure and stage 1 through stage 4 chronic kidney disease, or unspecified chronic kidney disease: Secondary | ICD-10-CM | POA: Insufficient documentation

## 2017-01-22 DIAGNOSIS — Z951 Presence of aortocoronary bypass graft: Secondary | ICD-10-CM | POA: Insufficient documentation

## 2017-01-22 LAB — COMPREHENSIVE METABOLIC PANEL
ALK PHOS: 78 U/L (ref 38–126)
ALT: 13 U/L — ABNORMAL LOW (ref 14–54)
ANION GAP: 8 (ref 5–15)
AST: 19 U/L (ref 15–41)
Albumin: 3.4 g/dL — ABNORMAL LOW (ref 3.5–5.0)
BILIRUBIN TOTAL: 0.6 mg/dL (ref 0.3–1.2)
BUN: 26 mg/dL — ABNORMAL HIGH (ref 6–20)
CALCIUM: 9.6 mg/dL (ref 8.9–10.3)
CO2: 28 mmol/L (ref 22–32)
CREATININE: 1.02 mg/dL — AB (ref 0.44–1.00)
Chloride: 105 mmol/L (ref 101–111)
GFR, EST AFRICAN AMERICAN: 57 mL/min — AB (ref >60)
GFR, EST NON AFRICAN AMERICAN: 49 mL/min — AB (ref >60)
Glucose, Bld: 123 mg/dL — ABNORMAL HIGH (ref 65–99)
Potassium: 4.6 mmol/L (ref 3.5–5.1)
SODIUM: 141 mmol/L (ref 135–145)
TOTAL PROTEIN: 6.7 g/dL (ref 6.5–8.1)

## 2017-01-22 LAB — CBC WITH DIFFERENTIAL/PLATELET
Basophils Absolute: 0 10*3/uL (ref 0.0–0.1)
Basophils Relative: 1 %
EOS ABS: 0.1 10*3/uL (ref 0.0–0.7)
Eosinophils Relative: 2 %
HCT: 35.4 % — ABNORMAL LOW (ref 36.0–46.0)
HEMOGLOBIN: 12 g/dL (ref 12.0–15.0)
LYMPHS ABS: 1.4 10*3/uL (ref 0.7–4.0)
LYMPHS PCT: 26 %
MCH: 29.2 pg (ref 26.0–34.0)
MCHC: 33.9 g/dL (ref 30.0–36.0)
MCV: 86.1 fL (ref 78.0–100.0)
Monocytes Absolute: 0.4 10*3/uL (ref 0.1–1.0)
Monocytes Relative: 7 %
NEUTROS PCT: 64 %
Neutro Abs: 3.5 10*3/uL (ref 1.7–7.7)
Platelets: 193 10*3/uL (ref 150–400)
RBC: 4.11 MIL/uL (ref 3.87–5.11)
RDW: 14.6 % (ref 11.5–15.5)
WBC: 5.4 10*3/uL (ref 4.0–10.5)

## 2017-01-22 LAB — CBG MONITORING, ED: Glucose-Capillary: 102 mg/dL — ABNORMAL HIGH (ref 65–99)

## 2017-01-22 MED ORDER — MECLIZINE HCL 12.5 MG PO TABS
25.0000 mg | ORAL_TABLET | Freq: Once | ORAL | Status: AC
  Administered 2017-01-22: 25 mg via ORAL
  Filled 2017-01-22: qty 2

## 2017-01-22 NOTE — ED Triage Notes (Signed)
Awakened with dizziness, has not had her hypertensive meds becaasue per daughter she gets her meds after she heats- She is followed by Dr Wendi Snipes and could only get an appt at 1500 today so came here-  Pt reported to daughter at 0930 that she could not get up

## 2017-01-22 NOTE — ED Provider Notes (Signed)
[facility] DEPT Provider Note   CSN: [id number] Arrival date & time: 01/22/17  1101  By signing my name below, I, [name], attest that this documentation has been prepared under the direction and in the presence of Morgan Ferguson, MD . Electronically Signed: Higinio Figueroa, Scribe. 01/22/2017. 12:52 PM.  History   Chief Complaint Chief Complaint  Patient presents with  . Dizziness  . Hypertension   Pt complains of vertigo symptoms   The history is provided by the patient and a relative. No language interpreter was used.  Dizziness  Quality:  Vertigo Onset quality:  Sudden Progression:  Improving Chronicity:  Recurrent Context: not when bending over   Relieved by:  Nothing Associated symptoms: no diarrhea    HPI Comments: [name] is a 83 y.o. female with PMHx of vertigo, DM2, and HTN, who presents to the Emergency Department complaining of sudden onset, gradually improving, sensation of dizziness consistent with previous episodes of vertigo that began this morning. Per daughter, pt called for her this morning saying that "every time she tried to get up, her head would swing back." She notes she checked pt's blood pressure which was 222/86 and states it "scared her more than the dizziness" so she brought her to the ED. Pt's daughter reports pt did not receive her HTN medication this morning as she only receives them after she has eaten. She denies any other complaints.   Past Medical History:  Diagnosis Date  . Anemia   . Anxiety   . Arthritis    back, knees, and hips  . Asthma    with allergies  . Balance problems   . CAD (coronary artery disease)   . Carotid stenosis   . CHF (congestive heart failure) ([place])    EF preserved Echo 2012  . Chronic kidney infection   . 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 Dementia    "a little"  . Depression   . Diabetes mellitus    type 2  .  Furuncle of back, except buttock   . GERD (gastroesophageal reflux disease)   . H/O seasonal allergies   . Hearing loss   . Hypertension   . Iron deficiency anemia 05/27/2011  . Myocardial infarction   . Pneumonia    hx of  . Renal insufficiency   . Shortness of breath dyspnea    with activity  . Speech problem    from stroke  . Swallowing difficulty   . Vertigo    "when sugar gets low"  . Vision loss    left eye-"half blind"    Patient Active Problem List   Diagnosis Date Noted  . Overweight (BMI 25.0-29.9) 05/08/2016  . UTI (lower urinary tract infection) 11/12/2015  . DM hyperosmolarity type II, uncontrolled ([place]) 11/12/2015  . Dementia   . Encephalopathy, metabolic 50/35/4656  . S/P right TKA 08/13/2015  . S/P knee replacement 08/13/2015  . Elevated troponin 03/06/2015  . Fever   . History of diabetes mellitus   . Left buttock pain   . Hyperlipidemia 01/11/2015  . GAD (generalized anxiety disorder) 01/11/2015  . Depression 01/11/2015  . GERD (gastroesophageal reflux disease) 01/11/2015  . Osteopenia 07/13/2014  . Obesity (BMI 30-39.9) 06/19/2014  . Carotid stenosis   . Anemia of chronic disease 12/25/2011  . Iron deficiency anemia 05/27/2011  . Diastolic CHF, chronic ([place]) 04/02/2010  . Type 2 diabetes mellitus with insulin  therapy ([place]) 10/08/2009  . DYSLIPIDEMIA 10/08/2009  . Essential hypertension 10/08/2009  . Coronary atherosclerosis 10/08/2009  . CAROTID STENOSIS 10/08/2009    Past Surgical History:  Procedure Laterality Date  . APPENDECTOMY     with hysterectomy  . CATARACT EXTRACTION Bilateral 5 years ago  . CHOLECYSTECTOMY    . CORONARY ANGIOPLASTY     prior to 2006 5 stents  . CORONARY ARTERY BYPASS GRAFT  2006  . I & D of Furuncle  April 2013  . KNEE SURGERY Right   . TOTAL ABDOMINAL HYSTERECTOMY  ~83 years old   complete, with tumor removal  . TOTAL KNEE ARTHROPLASTY Right 08/13/2015   Procedure: RIGHT  TOTAL KNEE ARTHROPLASTY;  Surgeon:  Paralee Cancel, MD;  Location: [facility];  Service: Orthopedics;  Laterality: Right;    OB History    Gravida Para Term Preterm AB Living   4 4 4     3    SAB TAB Ectopic Multiple Live Births                 Home Medications    Prior to Admission medications   Medication Sig Start Date End Date Taking? Authorizing Provider  amLODipine (NORVASC) 10 MG tablet Take 1 tablet (10 mg total) by mouth daily. 12/02/16  Yes Wardell Honour, MD  Artificial Tear Ointment (DRY EYES OP) Apply 1 drop to eye daily as needed (dry/red eyes).    Yes Historical Provider, MD  atorvastatin (LIPITOR) 20 MG tablet TAKE (1) TABLET DAILY AT BEDTIME. 12/02/16  Yes Wardell Honour, MD  clopidogrel (PLAVIX) 75 MG tablet Take 1 tablet (75 mg total) by mouth daily. 12/02/16  Yes Wardell Honour, MD  esomeprazole (NEXIUM) 40 MG capsule Take 1 capsule (40 mg total) by mouth daily. 12/02/16  Yes Wardell Honour, MD  ferrous sulfate 325 (65 FE) MG tablet TAKE (1) TABLET THREE TIMES DAILY AFTER MEALS. 10/05/16  Yes Wardell Honour, MD  furosemide (LASIX) 20 MG tablet Take 1 tablet (20 mg total) by mouth daily as needed for fluid or edema. 12/02/16  Yes Wardell Honour, MD  HUMALOG KWIKPEN 100 UNIT/ML KiwkPen USE PER SLIDING SCALE NOTIFY MD OF RESULTS UNDER 60 OR OVER 300. 02/05/16  Yes Sharion Balloon, FNP  insulin lispro (HUMALOG KWIKPEN) 100 UNIT/ML KiwkPen Inject 10-15 Units into the skin daily as needed (high blood sugar).   Yes Historical Provider, MD  isosorbide mononitrate (IMDUR) 30 MG 24 hr tablet TAKE (1) TABLET BY MOUTH ONCE DAILY. 12/02/16  Yes Wardell Honour, MD  LEVEMIR FLEXTOUCH 100 UNIT/ML Pen INJECT 28 UNITS EVERY MORNING 11/03/16  Yes Sharion Balloon, FNP  lisinopril (PRINIVIL,ZESTRIL) 10 MG tablet Take 1 tablet (10 mg total) by mouth at bedtime. 11/18/16 02/16/17 Yes Minus Breeding, MD  lisinopril (PRINIVIL,ZESTRIL) 20 MG tablet TAKE (1) TABLET BY MOUTH ONCE DAILY. 12/02/16  Yes Wardell Honour, MD    LORazepam (ATIVAN) 0.5 MG tablet TAKE (1) TABLET TWICE DAILY as needed for anxiety 12/02/16  Yes Wardell Honour, MD  meclizine (ANTIVERT) 25 MG tablet TAKE 1 TABLET TWICE DAILY AS NEEDED FOR DIZZINESS. 12/02/16  Yes Wardell Honour, MD  metFORMIN (GLUCOPHAGE-XR) 500 MG 24 hr tablet TAKE 1 TABLET WITH BREAKFAST AND 1 TABLET WITH SUPPER. 12/02/16  Yes Wardell Honour, MD  metoprolol (LOPRESSOR) 50 MG tablet TAKE (1) TABLET TWICE DAILY. 12/02/16  Yes Wardell Honour, MD  Multiple Vitamins-Iron (DAILY VITAMINS/IRON/BETA CAROT PO) Take 1 tablet by  mouth at bedtime.   Yes Historical Provider, MD  naproxen sodium (ANAPROX) 220 MG tablet Take 220 mg by mouth daily as needed (pain).    Yes Historical Provider, MD  nitroGLYCERIN (NITROSTAT) 0.4 MG SL tablet PLACE ONE (1) TABLET UNDER TONGUE EVERY 5 MINUTES UP TO (3) DOSES AS NEEDED FOR CHEST PAIN. 09/04/16  Yes Sharion Balloon, FNP  PARoxetine (PAXIL) 20 MG tablet Take 1 tablet (20 mg total) by mouth daily. 12/02/16  Yes Wardell Honour, MD  cephALEXin (KEFLEX) 500 MG capsule TAKE (1) CAPSULE FOUR TIMES DAILY. Patient not taking: Reported on 01/22/2017 01/05/17   Chipper Herb, MD  ciprofloxacin (CIPRO) 500 MG tablet Take 1 tablet (500 mg total) by mouth 2 (two) times daily. Patient not taking: Reported on 01/22/2017 12/27/16   Jola Schmidt, MD  EASY [facility] INSULIN SYRINGE 31G X 5/16" 1 ML MISC USE AS DIRECTED. 02/07/15   Chipper Herb, MD  EASY TOUCH PEN NEEDLES 31G X 8 MM MISC AS DIRECTED 04/10/16   Wardell Honour, MD  Insulin Pen Needle 31G X 5 MM MISC Use with  Levemir injection 11/14/15   Albertine Patricia, MD  linaclotide (LINZESS) 145 MCG CAPS capsule Take 1 capsule (145 mcg total) by mouth daily. To regulate bowel movements Patient not taking: Reported on 01/22/2017 12/02/16   Wardell Honour, MD  metroNIDAZOLE (FLAGYL) 500 MG tablet Take 1 tablet (500 mg total) by mouth 3 (three) times daily. Patient not taking: Reported on 01/22/2017 12/31/16    Timmothy Euler, MD  traMADol (ULTRAM) 50 MG tablet Take 1 tablet (50 mg total) by mouth every 6 (six) hours as needed. Patient not taking: Reported on 01/22/2017 12/21/16   Fransisca Kaufmann Dettinger, MD    Family History Family History  Problem Relation Age of Onset  . Cancer Brother     porstate  . Early death Sister   . Heart disease Brother   . Heart disease Brother   . Heart attack Brother   . Heart disease Brother   . Heart attack Brother   . Diabetes Sister   . Heart attack Sister   . Diabetes Sister   . Osteoporosis Sister   . Hypertension Sister     Social History Social History  Substance Use Topics  . Smoking status: Never Smoker  . Smokeless tobacco: Never Used  . Alcohol use No   Allergies   Advil [ibuprofen]; Heparin; and Propoxyphene n-acetaminophen  Review of Systems Review of Systems  Constitutional: Negative for appetite change and fatigue.  HENT: Negative for congestion, ear discharge and sinus pressure.   Eyes: Negative for discharge.  Respiratory: Negative for cough.   Gastrointestinal: Negative for diarrhea.  Genitourinary: Negative for frequency and hematuria.  Musculoskeletal: Negative for back pain.  Skin: Negative for rash.  Neurological: Positive for dizziness. Negative for seizures.  Psychiatric/Behavioral: Negative for hallucinations.   Physical Exam Updated Vital Signs BP (!) 195/113   Pulse 69   Temp 98.8 F (37.1 C) (Oral)   Resp 18   Ht 5\' 6"  (1.676 m)   Wt 154 lb (69.9 kg)   SpO2 97%   BMI 24.86 kg/m   Physical Exam  Constitutional: She is oriented to person, place, and time. She appears well-developed.  HENT:  Head: Normocephalic.  Eyes: Conjunctivae and EOM are normal. No scleral icterus.  Neck: Neck supple. No thyromegaly present.  Cardiovascular: Normal rate and regular rhythm.  Exam reveals no gallop and no friction rub.  No murmur heard. Pulmonary/Chest: No stridor. She has no wheezes. She has no rales. She exhibits  no tenderness.  Abdominal: She exhibits no distension. There is no tenderness. There is no rebound.  Musculoskeletal: Normal range of motion. She exhibits no edema.  Lymphadenopathy:    She has no cervical adenopathy.  Neurological: She is oriented to person, place, and time. She exhibits normal muscle tone. Coordination normal.  Skin: No rash noted. No erythema.  Psychiatric: She has a normal mood and affect. Her behavior is normal.   ED Treatments / Results  DIAGNOSTIC STUDIES:  Oxygen Saturation is 97% on RA, normal by my interpretation.    COORDINATION OF CARE:  12:48 PM Discussed treatment Figueroa with pt at bedside and pt agreed to Figueroa.  Labs (all labs ordered are listed, but only abnormal results are displayed) Labs Reviewed  CBC WITH DIFFERENTIAL/PLATELET - Abnormal; Notable for the following:       Result Value   HCT 35.4 (*)    All other components within normal limits  COMPREHENSIVE METABOLIC PANEL - Abnormal; Notable for the following:    Glucose, Bld 123 (*)    BUN 26 (*)    Creatinine, Ser 1.02 (*)    Albumin 3.4 (*)    ALT 13 (*)    GFR calc non Af Amer 49 (*)    GFR calc Af Amer 57 (*)    All other components within normal limits  CBG MONITORING, ED - Abnormal; Notable for the following:    Glucose-Capillary 102 (*)    All other components within normal limits    EKG  EKG Interpretation None       Radiology No results found.  Procedures Procedures (including critical care time)  Medications Ordered in ED Medications - No data to display  Initial Impression / Assessment and Figueroa / ED Course  I have reviewed the triage vital signs and the nursing notes.  Pertinent labs & imaging results that were available during my care of the patient were reviewed by me and considered in my medical decision making (see chart for details).     Patient with vertigo improved with Antivert she will follow-up with her PCP  Final Clinical Impressions(s) / ED  Diagnoses   Final diagnoses:  None    New Prescriptions New Prescriptions   No medications on file  The chart was scribed for me under my direct supervision.  I personally performed the history, physical, and medical decision making and all procedures in the evaluation of this patient.Morgan Ferguson, MD 01/22/17 [phone]

## 2017-01-22 NOTE — Discharge Instructions (Signed)
Follow up with your md if not improving. °

## 2017-01-22 NOTE — ED Notes (Signed)
EKG given to Dr. Roderic Palau and notified of pt symptoms.

## 2017-01-22 NOTE — Telephone Encounter (Signed)
Pt called in with BP 226/88 Pt is also light-headed Will go to ED

## 2017-01-23 ENCOUNTER — Encounter: Payer: Self-pay | Admitting: Family Medicine

## 2017-01-23 ENCOUNTER — Ambulatory Visit (INDEPENDENT_AMBULATORY_CARE_PROVIDER_SITE_OTHER): Payer: Medicare Other | Admitting: Family Medicine

## 2017-01-23 VITALS — BP 123/61 | HR 55 | Temp 96.6°F | Ht 66.0 in | Wt 154.0 lb

## 2017-01-23 DIAGNOSIS — Z794 Long term (current) use of insulin: Secondary | ICD-10-CM

## 2017-01-23 DIAGNOSIS — R8290 Unspecified abnormal findings in urine: Secondary | ICD-10-CM | POA: Diagnosis not present

## 2017-01-23 DIAGNOSIS — E119 Type 2 diabetes mellitus without complications: Secondary | ICD-10-CM | POA: Diagnosis not present

## 2017-01-23 DIAGNOSIS — I10 Essential (primary) hypertension: Secondary | ICD-10-CM

## 2017-01-23 NOTE — Patient Instructions (Addendum)
Great to see you!  We will culture her urine.   Her blood sugars look good.   Take only 20 mg lisinopril once daily ( stop 10 mg dose)

## 2017-01-23 NOTE — Progress Notes (Signed)
   HPI  Patient presents today here for emergency room follow-up.  After hours visit, Saturday morning.   Patient was seen in the emergency room one day ago for dizziness and hypertension.  Her dizziness has resolved.  Her daughter states that her blood pressure was measured at 801 systolic this morning. The patient continues off her left-sided low back pain that has been persistent for a while now.  There also complaining of foul-smelling urine. Patient states that she felt hot in the night, otherwise she has not had any measured fevers, chills, or observed sweats.  She has reduced appetite but is tolerating food and fluids normally.    PMH: Smoking status noted ROS: Per HPI  Objective: BP 123/61   Pulse (!) 55   Temp (!) 96.6 F (35.9 C) (Oral)   Ht 5\' 6"  (1.676 m)   Wt 154 lb (69.9 kg)   BMI 24.86 kg/m  Gen: NAD, alert, cooperative with exam HEENT: NCAT, oropharynx moist and clear CV: RRR, good S1/S2, no murmur Resp: CTABL, no wheezes, non-labored Abd: Soft, some tenderness to palpation of the left lower quadrant insistent with my last exam from about one month ago, no suprapubic tenderness, no epigastric tenderness Ext: No edema, warm Neuro: Alert and oriented, No gross deficits  Assessment and plan:  # Foul-smelling urine Urinalysis today is not suspicious for UTI, patient has been treated with several courses of antibiotics recently Defer antibiotics Urine culture   # Hypertension Well-controlled in our clinic today They have been changing her hypertension regimen over the last 36 hours, when she went to the emergency room she did not take medicines until after the emergency room, then she did not get her full days' medications like usual Last night her daughter gave her 10 mg of lisinopril then gave her 20 more a few hours later. She's been taking 10 mg of lisinopril +20 mg of lisinopril at night Simplify regimen, continue 20 mg lisinopril, Imdur,  amlodipine, metoprolol, Lasix  # T2 diabetes Treated with reduced dose metformin and Levemir Blood sugars have been concerning to the family, on review this has been 140-160 Reassurance provided, continue current regimen They have been giving her sugar sweetened juices as a laxative, recommended plain miralax and discussed reducing sugar in diet    Orders Placed This Encounter  Procedures  . Urine culture  . Urinalysis, Complete     Laroy Apple, MD [facility] 01/23/2017, 9:14 AM

## 2017-01-24 LAB — URINE CULTURE

## 2017-01-25 ENCOUNTER — Ambulatory Visit: Payer: Medicare Other | Admitting: Physical Therapy

## 2017-01-25 DIAGNOSIS — R8290 Unspecified abnormal findings in urine: Secondary | ICD-10-CM | POA: Diagnosis not present

## 2017-01-25 DIAGNOSIS — M545 Low back pain, unspecified: Secondary | ICD-10-CM

## 2017-01-25 DIAGNOSIS — R2681 Unsteadiness on feet: Secondary | ICD-10-CM | POA: Diagnosis not present

## 2017-01-25 DIAGNOSIS — M25552 Pain in left hip: Secondary | ICD-10-CM

## 2017-01-25 LAB — URINALYSIS, COMPLETE
BILIRUBIN UA: NEGATIVE
Glucose, UA: NEGATIVE
Ketones, UA: NEGATIVE
LEUKOCYTES UA: NEGATIVE
Nitrite, UA: NEGATIVE
Specific Gravity, UA: 1.02 (ref 1.005–1.030)
Urobilinogen, Ur: 0.2 mg/dL (ref 0.2–1.0)
pH, UA: 7 (ref 5.0–7.5)

## 2017-01-25 LAB — URINALYSIS
Bilirubin, UA: NEGATIVE
Glucose, UA: NEGATIVE
Ketones, UA: NEGATIVE
LEUKOCYTES UA: NEGATIVE
NITRITE UA: NEGATIVE
PH UA: 7 (ref 5.0–7.5)
SPEC GRAV UA: 1.02 (ref 1.005–1.030)
Urobilinogen, Ur: 0.2 mg/dL (ref 0.2–1.0)

## 2017-01-25 NOTE — Addendum Note (Signed)
Addended by: Rolena Infante on: 01/25/2017 02:29 PM   Modules accepted: Orders

## 2017-01-25 NOTE — Therapy (Signed)
[facility] [place], [place], [address] Phone: [phone]   Fax:  [phone]  Physical Therapy Treatment  Patient Details  Name: [name] MRN: [id number] Date of Birth: 06-13-1931 Referring Provider: Kenn File  Encounter Date: 01/25/2017      PT End of Session - 01/25/17 0948    Visit Number 3   Number of Visits 16   Date for PT Re-Evaluation 03/15/17   PT Start Time 0948   PT Stop Time 1043   PT Time Calculation (min) 55 min   Activity Tolerance Patient tolerated treatment well   Behavior During Therapy [facility] for tasks assessed/performed      Past Medical History:  Diagnosis Date  . Anemia   . Anxiety   . Arthritis    back, knees, and hips  . Asthma    with allergies  . Balance problems   . CAD (coronary artery disease)   . Carotid stenosis   . CHF (congestive heart failure) ([place])    EF preserved Echo 2012  . Chronic kidney infection   . 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 Dementia    "a little"  . Depression   . Diabetes mellitus    type 2  . Furuncle of back, except buttock   . GERD (gastroesophageal reflux disease)   . H/O seasonal allergies   . Hearing loss   . Hypertension   . Iron deficiency anemia 05/27/2011  . Myocardial infarction   . Pneumonia    hx of  . Renal insufficiency   . Shortness of breath dyspnea    with activity  . Speech problem    from stroke  . Swallowing difficulty   . Vertigo    "when sugar gets low"  . Vision loss    left eye-"half blind"    Past Surgical History:  Procedure Laterality Date  . APPENDECTOMY     with hysterectomy  . CATARACT EXTRACTION Bilateral 5 years ago  . CHOLECYSTECTOMY    . CORONARY ANGIOPLASTY     prior to 2006 5 stents  . CORONARY ARTERY BYPASS GRAFT  2006  . I & D of Furuncle  April 2013  . KNEE SURGERY Right   . TOTAL ABDOMINAL  HYSTERECTOMY  ~83 years old   complete, with tumor removal  . TOTAL KNEE ARTHROPLASTY Right 08/13/2015   Procedure: RIGHT  TOTAL KNEE ARTHROPLASTY;  Surgeon: Paralee Cancel, MD;  Location: [facility];  Service: Orthopedics;  Laterality: Right;    There were no vitals filed for this visit.      Subjective Assessment - 01/25/17 0948    Subjective Patient reports that her side is still really hurting her.   Patient is accompained by: Family member   Pertinent History HTN, DM, asthma, osteoporosis, TKR   How long can you sit comfortably? 1 hour depending on surface   Diagnostic tests MRI, CT scan and xrays taken at [facility] per daughter   Patient Stated Goals get rid of pain   Currently in Pain? Yes   Pain Score --  unable to give #, but > 5   Pain Location Hip   Pain Orientation Right   Pain Descriptors / Indicators Throbbing   Pain Type Acute pain   Pain Onset More than a month ago   Aggravating Factors  sitting, moving in bed   Pain Relieving Factors meds, icy  hot, hot water bottle   Effect of Pain on Daily Activities limited with ambulation                         [facility] Adult PT Treatment/Exercise - 01/25/17 0001      Modalities   Modalities Electrical Stimulation;Moist Heat     Moist Heat Therapy   Number Minutes Moist Heat 15 Minutes   Moist Heat Location Lumbar Spine;Hip     Electrical Stimulation   Electrical Stimulation Location low back and L gluteals   Electrical Stimulation Action premod   Electrical Stimulation Parameters 80-150 Hz x 15mn   Electrical Stimulation Goals Pain     Manual Therapy   Manual Therapy Soft tissue mobilization   Soft tissue mobilization to L low back and gluteals          Trigger Point Dry Needling - 01/25/17 1221    Consent Given? Yes   Education Handout Provided Yes   Muscles Treated Upper Body Quadratus Lumborum;Longissimus  L   Muscles Treated Lower Body Gluteus minimus;Gluteus maximus;Piriformis  L    Longissimus Response Twitch response elicited;Palpable increased muscle length   Gluteus Maximus Response Twitch response elicited;Palpable increased muscle length   Gluteus Minimus Response Twitch response elicited;Palpable increased muscle length   Piriformis Response Twitch response elicited;Palpable increased muscle length              PT Education - 01/25/17 1222    Education provided Yes   Education Details reviewed DN aftercare   Person(s) Educated Patient   Methods Explanation;Demonstration;Handout   Comprehension Verbalized understanding          PT Short Term Goals - 01/18/17 1103      PT SHORT TERM GOAL #1   Title Patient to report decreased pain in L buttocks and back to 5/10 or less with ADLS. 02/15/17   Time 4   Period Weeks   Status New     PT SHORT TERM GOAL #2   Title Patient able to peform supine to sit with 5/10 pain or less. (02/15/17)   Time 4   Period Weeks   Status New     PT SHORT TERM GOAL #3   Title I with initial HEP (02/15/17)   Time 4   Period Weeks   Status New           PT Long Term Goals - 01/18/17 1105      PT LONG TERM GOAL #1   Title I   Time 8   Status New     PT LONG TERM GOAL #2   Title Patient able to peform ADLS with 39/37pain or less in the left buttock and back.   Time 8   Period Weeks   Status New     PT LONG TERM GOAL #3   Title Patient to demo improved B hip flexion to 4+/5 or better to improve function   Time 8   Period Weeks   Status New     PT LONG TERM GOAL #4   Title Patient able to ambulate in clinic safely 350 ft with least restrictive AD and no LOB.   Time 8   Period Weeks   Status New               Plan - 01/25/17 1222    Clinical Impression Statement Patient presented today with less discomfort overall with mobility. She tolerated DN well with ++twitch response in  L gluteals and piriformis. No goals met as only second visit.   Rehab Potential Good   PT Frequency 2x / week   PT  Duration 8 weeks   PT Treatment/Interventions ADLs/Self Care Home Management;Cryotherapy;Air traffic controller;Therapeutic activities;Therapeutic exercise;Balance training;Neuromuscular re-education;Manual techniques;Patient/family education;Dry needling   PT Next Visit Plan Assess DN; Manual therapy to Left low back and buttocks as tolerated; modalities prn; DN to low back and gluteals as indicated. Pain free therex.  Progress to balance/gait once pain tolerable.   Consulted and Agree with Plan of Care Patient   Family Member Consulted Daughter      Patient will benefit from skilled therapeutic intervention in order to improve the following deficits and impairments:  Pain, Abnormal gait, Postural dysfunction, Decreased strength, Decreased range of motion  Visit Diagnosis: Pain in left hip  Acute left-sided low back pain without sciatica     Problem List Patient Active Problem List   Diagnosis Date Noted  . Overweight (BMI 25.0-29.9) 05/08/2016  . UTI (lower urinary tract infection) 11/12/2015  . DM hyperosmolarity type II, uncontrolled ([place]) 11/12/2015  . Dementia   . Encephalopathy, metabolic 27/06/8674  . S/P right TKA 08/13/2015  . S/P knee replacement 08/13/2015  . Elevated troponin 03/06/2015  . Fever   . History of diabetes mellitus   . Left buttock pain   . Hyperlipidemia 01/11/2015  . GAD (generalized anxiety disorder) 01/11/2015  . Depression 01/11/2015  . GERD (gastroesophageal reflux disease) 01/11/2015  . Osteopenia 07/13/2014  . Obesity (BMI 30-39.9) 06/19/2014  . Carotid stenosis   . Anemia of chronic disease 12/25/2011  . Iron deficiency anemia 05/27/2011  . Diastolic CHF, chronic ([place]) 04/02/2010  . Type 2 diabetes mellitus with insulin therapy ([place]) 10/08/2009  . DYSLIPIDEMIA 10/08/2009  . Essential hypertension 10/08/2009  . Coronary atherosclerosis 10/08/2009  . CAROTID STENOSIS 10/08/2009    Madelyn Flavors  PT 01/25/2017, 12:25 PM  [facility] [address] [place], [place], [address] Phone: [phone]   Fax:  [phone]  Name: DAJE STARK MRN: [id number] Date of Birth: [birth year]

## 2017-01-25 NOTE — Patient Instructions (Signed)
Trigger Point Dry Needling  . What is Trigger Point Dry Needling (DN)? o DN is a physical therapy technique used to treat muscle pain and dysfunction. Specifically, DN helps deactivate muscle trigger points (muscle knots).  o A thin filiform needle is used to penetrate the skin and stimulate the underlying trigger point. The goal is for a local twitch response (LTR) to occur and for the trigger point to relax. No medication of any kind is injected during the procedure.   . What Does Trigger Point Dry Needling Feel Like?  o The procedure feels different for each individual patient. Some patients report that they do not actually feel the needle enter the skin and overall the process is not painful. Very mild bleeding may occur. However, many patients feel a deep cramping in the muscle in which the needle was inserted. This is the local twitch response.   Marland Kitchen How Will I feel after the treatment? o Soreness is normal, and the onset of soreness may not occur for a few hours. Typically this soreness does not last longer than two days.  o Bruising is uncommon, however; ice can be used to decrease any possible bruising.  o In rare cases feeling tired or nauseous after the treatment is normal. In addition, your symptoms may get worse before they get better, this period will typically not last longer than 24 hours.   . What Can I do After My Treatment? o Increase your hydration by drinking more water for the next 24 hours. o You may place ice or heat on the areas treated that have become sore, however, do not use heat on inflamed or bruised areas. Heat often brings more relief post needling. o You can continue your regular activities, but vigorous activity is not recommended initially after the treatment for 24 hours. o DN is best combined with other physical therapy such as strengthening, stretching, and other therapies.    Precautions:  In some cases, dry needling is done over the lung field. While rare,  there is a risk of pneumothorax (punctured lung). Because of this, if you ever experience shortness of breath on exertion, difficulty taking a deep breath, chest pain or a dry cough following dry needling, you should report to an emergency room and tell them that you have been dry needled over the thorax. [name], PT 01/25/17 12:21 PM [facility] [address] [place], [place], [address] Phone: [phone]   Fax:  [phone]

## 2017-01-29 ENCOUNTER — Encounter: Payer: Self-pay | Admitting: Family Medicine

## 2017-01-29 ENCOUNTER — Ambulatory Visit (INDEPENDENT_AMBULATORY_CARE_PROVIDER_SITE_OTHER): Payer: Medicare Other | Admitting: Family Medicine

## 2017-01-29 ENCOUNTER — Ambulatory Visit: Payer: Medicare Other | Admitting: Physical Therapy

## 2017-01-29 VITALS — BP 134/61 | HR 64 | Temp 97.3°F | Ht 66.0 in | Wt 152.0 lb

## 2017-01-29 DIAGNOSIS — Z794 Long term (current) use of insulin: Secondary | ICD-10-CM | POA: Diagnosis not present

## 2017-01-29 DIAGNOSIS — N390 Urinary tract infection, site not specified: Secondary | ICD-10-CM | POA: Diagnosis not present

## 2017-01-29 DIAGNOSIS — F0390 Unspecified dementia without behavioral disturbance: Secondary | ICD-10-CM

## 2017-01-29 DIAGNOSIS — E119 Type 2 diabetes mellitus without complications: Secondary | ICD-10-CM | POA: Diagnosis not present

## 2017-01-29 DIAGNOSIS — M545 Low back pain, unspecified: Secondary | ICD-10-CM

## 2017-01-29 DIAGNOSIS — R3129 Other microscopic hematuria: Secondary | ICD-10-CM

## 2017-01-29 DIAGNOSIS — M25552 Pain in left hip: Secondary | ICD-10-CM

## 2017-01-29 DIAGNOSIS — R2681 Unsteadiness on feet: Secondary | ICD-10-CM | POA: Diagnosis not present

## 2017-01-29 DIAGNOSIS — R319 Hematuria, unspecified: Secondary | ICD-10-CM | POA: Diagnosis not present

## 2017-01-29 LAB — URINALYSIS, COMPLETE
Bilirubin, UA: NEGATIVE
GLUCOSE, UA: NEGATIVE
KETONES UA: NEGATIVE
Leukocytes, UA: NEGATIVE
Nitrite, UA: NEGATIVE
SPEC GRAV UA: 1.02 (ref 1.005–1.030)
Urobilinogen, Ur: 0.2 mg/dL (ref 0.2–1.0)
pH, UA: 5.5 (ref 5.0–7.5)

## 2017-01-29 LAB — MICROSCOPIC EXAMINATION

## 2017-01-29 NOTE — Patient Instructions (Signed)
Great to see you!  Come back in 2 months to check memory  We will call with lab results

## 2017-01-29 NOTE — Progress Notes (Signed)
   HPI  Patient presents today for follow-up of microscopic hematuria, diabetes, and hypertension.  Blood pressure at home has been consistently 140s and low 150s. Family reports good medication tolerance and compliance.  Her daughter is present with her and states that she takes insulin on a regular basis, Levemir daily and Humalog only as needed. Denies any concerns for hypoglycemia Random glucose has been mostly 150-200, Humalog is given only 1 blood sugar is above 200.  Her daughter complains of some worsening confusion. However there is no severe behavior abnormalities that are disturbing.  PMH: Smoking status noted ROS: Per HPI  Objective: BP 134/61   Pulse 64   Temp 97.3 F (36.3 C) (Oral)   Ht 5\' 6"  (1.676 m)   Wt 152 lb (68.9 kg)   BMI 24.53 kg/m  Gen: NAD, alert, cooperative with exam HEENT: NCAT, EOMI, PERRL CV: RRR, good S1/S2, no murmur Resp: CTABL, no wheezes, non-labored Abd: SNTND, BS present, no guarding or organomegaly Ext: No edema, warm Neuro: Alert and oriented, No gross deficits  Assessment and plan:  # Microscopic hematuria, frequent UTI Patient has been treated for several UTIs in the past few months She was seen over the weekend with complaints of UTI like symptoms, UA was clear at that time, no micro-is available. Urine culture was negative as well. However she did have significant hematuria. Today she does not have leukocytes or nitrites, however she does have 3-10 RBCs/hpf, she also has 6-10 wbc's/hpf Culture Refer to urology, I'm concerned that she needs a cystoscopy given recent negative CT scan and persistent microscopic hematuria  She describes having urodynamic testing performed previously.  # Type 2 diabetes A1c pending, clinically stable Continue current insulin regimen Low-dose metformin with reduced GFR  # Dementia without behavioral disturbance Clinically worsening Plan to perform MMSE next visit - previously 24, pt cannot read  so this will lower score     Orders Placed This Encounter  Procedures  . Urine culture  . Urinalysis, Complete  . Bayer DCA Hb A1c Waived  . Ambulatory referral to Urology    Referral Priority:   Routine    Referral Type:   Consultation    Referral Reason:   Specialty Services Required    Requested Specialty:   Urology    Number of Visits Requested:   1    No orders of the defined types were placed in this encounter.   Laroy Apple, MD [facility] 01/29/2017, 11:34 AM

## 2017-01-29 NOTE — Therapy (Addendum)
[facility] [place], [place], [address] Phone: [phone]   Fax:  [phone]  Physical Therapy Treatment  Patient Details  Name: [name] MRN: [id number] Date of Birth: 27-Oct-1931 Referring Provider: Kenn File  Encounter Date: 01/29/2017      PT End of Session - 02/01/17 0953    Visit Number 5   Number of Visits 16   Date for PT Re-Evaluation 03/15/17   PT Start Time 0950   PT Stop Time 1048   PT Time Calculation (min) 58 min   Activity Tolerance Patient tolerated treatment well   Behavior During Therapy [facility] for tasks assessed/performed      Past Medical History:  Diagnosis Date  . Anemia   . Anxiety   . Arthritis    back, knees, and hips  . Asthma    with allergies  . Balance problems   . CAD (coronary artery disease)   . Carotid stenosis   . CHF (congestive heart failure) ([place])    EF preserved Echo 2012  . Chronic kidney infection   . 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 Dementia    "a little"  . Depression   . Diabetes mellitus    type 2  . Furuncle of back, except buttock   . GERD (gastroesophageal reflux disease)   . H/O seasonal allergies   . Hearing loss   . Hypertension   . Iron deficiency anemia 05/27/2011  . Myocardial infarction   . Pneumonia    hx of  . Renal insufficiency   . Shortness of breath dyspnea    with activity  . Speech problem    from stroke  . Swallowing difficulty   . Vertigo    "when sugar gets low"  . Vision loss    left eye-"half blind"    Past Surgical History:  Procedure Laterality Date  . APPENDECTOMY     with hysterectomy  . CATARACT EXTRACTION Bilateral 5 years ago  . CHOLECYSTECTOMY    . CORONARY ANGIOPLASTY     prior to 2006 5 stents  . CORONARY ARTERY BYPASS GRAFT  2006  . I & D of Furuncle  April 2013  . KNEE SURGERY Right   . TOTAL ABDOMINAL  HYSTERECTOMY  ~83 years old   complete, with tumor removal  . TOTAL KNEE ARTHROPLASTY Right 08/13/2015   Procedure: RIGHT  TOTAL KNEE ARTHROPLASTY;  Surgeon: Paralee Cancel, MD;  Location: [facility];  Service: Orthopedics;  Laterality: Right;    There were no vitals filed for this visit.      Subjective Assessment - 02/01/17 0954    Subjective Patient's daughter reports that the patient has another kidney infection. Patient will be starting antibiotic today. Patient reports she has benn able to walk around yard and house. Daughter reports she slept better last night.   Pertinent History HTN, DM, asthma, osteoporosis, TKR   How long can you sit comfortably? 1 hour depending on surface   Diagnostic tests MRI, CT scan and xrays taken at [facility] per daughter   Patient Stated Goals get rid of pain   Currently in Pain? Yes   Pain Score 4    Pain Location Back   Pain Orientation Left   Pain Descriptors / Indicators Throbbing   Pain Type Acute pain   Pain Onset More than a month ago   Pain Frequency Constant  Aggravating Factors  lying down   Pain Relieving Factors meds, icy hot, hot water bottle   Effect of Pain on Daily Activities limited with ambulation                                   PT Short Term Goals - 01/18/17 1103      PT SHORT TERM GOAL #1   Title Patient to report decreased pain in L buttocks and back to 5/10 or less with ADLS. 02/15/17   Time 4   Period Weeks   Status New     PT SHORT TERM GOAL #2   Title Patient able to peform supine to sit with 5/10 pain or less. (02/15/17)   Time 4   Period Weeks   Status New     PT SHORT TERM GOAL #3   Title I with initial HEP (02/15/17)   Time 4   Period Weeks   Status New           PT Long Term Goals - 01/18/17 1105      PT LONG TERM GOAL #1   Title I   Time 8   Status New     PT LONG TERM GOAL #2   Title Patient able to peform ADLS with 7/67 pain or less in the left buttock and back.    Time 8   Period Weeks   Status New     PT LONG TERM GOAL #3   Title Patient to demo improved B hip flexion to 4+/5 or better to improve function   Time 8   Period Weeks   Status New     PT LONG TERM GOAL #4   Title Patient able to ambulate in clinic safely 350 ft with least restrictive AD and no LOB.   Time 8   Period Weeks   Status New               Plan - 02/01/17 1043    Clinical Impression Statement Patient presents today with her two daughters. Her pain level persists at same level as last visit. Her daughter reports that she was able to walk better after last treatment, however, as patient has not started antibiotic it is unclear if pain is coming from kidney infection or another source. She responded well to modalities and STW today with increased tolerance to increased pressure. LTGs are ongoing.   Rehab Potential Good   PT Frequency 2x / week   PT Duration 8 weeks   PT Treatment/Interventions ADLs/Self Care Home Management;Cryotherapy;Air traffic controller;Therapeutic activities;Therapeutic exercise;Balance training;Neuromuscular re-education;Manual techniques;Patient/family education;Dry needling   PT Next Visit Plan Assess response to AB; Manual therapy to Left low back and buttocks as tolerated; modalities prn; DN to low back and gluteals as indicated. Pain free therex.  Progress to balance/gait once pain tolerable.      Patient will benefit from skilled therapeutic intervention in order to improve the following deficits and impairments:  Pain, Abnormal gait, Postural dysfunction, Decreased strength, Decreased range of motion  Visit Diagnosis: Pain in left hip  Acute left-sided low back pain without sciatica     Problem List Patient Active Problem List   Diagnosis Date Noted  . Overweight (BMI 25.0-29.9) 05/08/2016  . UTI (lower urinary tract infection) 11/12/2015  . DM hyperosmolarity type II, uncontrolled ([place])  11/12/2015  . Dementia   . Encephalopathy, metabolic 34/19/3790  . S/P  right TKA 08/13/2015  . S/P knee replacement 08/13/2015  . Elevated troponin 03/06/2015  . Fever   . History of diabetes mellitus   . Left buttock pain   . Hyperlipidemia 01/11/2015  . GAD (generalized anxiety disorder) 01/11/2015  . Depression 01/11/2015  . GERD (gastroesophageal reflux disease) 01/11/2015  . Osteopenia 07/13/2014  . Obesity (BMI 30-39.9) 06/19/2014  . Carotid stenosis   . Anemia of chronic disease 12/25/2011  . Iron deficiency anemia 05/27/2011  . Diastolic CHF, chronic ([place]) 04/02/2010  . Type 2 diabetes mellitus with insulin therapy ([place]) 10/08/2009  . DYSLIPIDEMIA 10/08/2009  . Essential hypertension 10/08/2009  . Coronary atherosclerosis 10/08/2009  . CAROTID STENOSIS 10/08/2009    Madelyn Flavors PT 02/02/2017, 9:28 AM  [facility] [address] [place], [place], [address] Phone: [phone]   Fax:  [phone]  Name: [name] MRN: [id number] Date of Birth: March 04, 1931

## 2017-01-31 LAB — URINE CULTURE

## 2017-02-01 ENCOUNTER — Other Ambulatory Visit: Payer: Self-pay | Admitting: Family Medicine

## 2017-02-01 ENCOUNTER — Ambulatory Visit: Payer: Medicare Other | Admitting: Physical Therapy

## 2017-02-01 DIAGNOSIS — M25552 Pain in left hip: Secondary | ICD-10-CM | POA: Diagnosis not present

## 2017-02-01 DIAGNOSIS — M545 Low back pain, unspecified: Secondary | ICD-10-CM

## 2017-02-01 DIAGNOSIS — R2681 Unsteadiness on feet: Secondary | ICD-10-CM | POA: Diagnosis not present

## 2017-02-01 MED ORDER — CEFDINIR 300 MG PO CAPS: 300.0000 mg | ORAL_CAPSULE | Freq: Two times a day (BID) | ORAL | 0 refills | Status: DC

## 2017-02-01 NOTE — Therapy (Signed)
[facility] [place], [place], [address] Phone: [phone]   Fax:  [phone]  Physical Therapy Treatment  Patient Details  Name: [name] MRN: [id number] Date of Birth: 1931-08-12 Referring Provider: Kenn File  Encounter Date: 02/01/2017      PT End of Session - 02/01/17 0953    Visit Number 5   Number of Visits 16   Date for PT Re-Evaluation 03/15/17   PT Start Time 0950   PT Stop Time 1048   PT Time Calculation (min) 58 min   Activity Tolerance Patient tolerated treatment well   Behavior During Therapy [facility] for tasks assessed/performed      Past Medical History:  Diagnosis Date  . Anemia   . Anxiety   . Arthritis    back, knees, and hips  . Asthma    with allergies  . Balance problems   . CAD (coronary artery disease)   . Carotid stenosis   . CHF (congestive heart failure) ([place])    EF preserved Echo 2012  . Chronic kidney infection   . 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 Dementia    "a little"  . Depression   . Diabetes mellitus    type 2  . Furuncle of back, except buttock   . GERD (gastroesophageal reflux disease)   . H/O seasonal allergies   . Hearing loss   . Hypertension   . Iron deficiency anemia 05/27/2011  . Myocardial infarction   . Pneumonia    hx of  . Renal insufficiency   . Shortness of breath dyspnea    with activity  . Speech problem    from stroke  . Swallowing difficulty   . Vertigo    "when sugar gets low"  . Vision loss    left eye-"half blind"    Past Surgical History:  Procedure Laterality Date  . APPENDECTOMY     with hysterectomy  . CATARACT EXTRACTION Bilateral 5 years ago  . CHOLECYSTECTOMY    . CORONARY ANGIOPLASTY     prior to 2006 5 stents  . CORONARY ARTERY BYPASS GRAFT  2006  . I & D of Furuncle  April 2013  . KNEE SURGERY Right   . TOTAL ABDOMINAL  HYSTERECTOMY  ~83 years old   complete, with tumor removal  . TOTAL KNEE ARTHROPLASTY Right 08/13/2015   Procedure: RIGHT  TOTAL KNEE ARTHROPLASTY;  Surgeon: Paralee Cancel, MD;  Location: [facility];  Service: Orthopedics;  Laterality: Right;    There were no vitals filed for this visit.      Subjective Assessment - 02/01/17 0954    Subjective Patient's daughter reports that the patient has another kidney infection. Patient will be starting antibiotic today. Patient reports she has benn able to walk around yard and house. Daughter reports she slept better last night.   Pertinent History HTN, DM, asthma, osteoporosis, TKR   How long can you sit comfortably? 1 hour depending on surface   Diagnostic tests MRI, CT scan and xrays taken at [facility] per daughter   Patient Stated Goals get rid of pain   Currently in Pain? Yes   Pain Score 4    Pain Location Back   Pain Orientation Left   Pain Descriptors / Indicators Throbbing   Pain Type Acute pain   Pain Onset More than a month ago   Pain Frequency Constant  Aggravating Factors  lying down   Pain Relieving Factors meds, icy hot, hot water bottle   Effect of Pain on Daily Activities limited with ambulation                         [facility] Adult PT Treatment/Exercise - 02/01/17 0001      Modalities   Modalities Electrical Stimulation;Moist Heat;Ultrasound     Moist Heat Therapy   Number Minutes Moist Heat 15 Minutes   Moist Heat Location Lumbar Spine     Electrical Stimulation   Electrical Stimulation Location L low back and gluteals   Electrical Stimulation Action IFC   Electrical Stimulation Parameters 80-150 Hz x 15 min   Electrical Stimulation Goals Pain     Ultrasound   Ultrasound Location L lumbar paraspinals and gluteals in sitting   Ultrasound Parameters 1.5  w/cm2 1 MHz cont x 10 min   Ultrasound Goals Pain     Manual Therapy   Manual Therapy Soft tissue mobilization;Myofascial release   Soft tissue  mobilization to L paraspinals, QL and gluteals   Myofascial Release TPR to L superior gluteals                  PT Short Term Goals - 01/18/17 1103      PT SHORT TERM GOAL #1   Title Patient to report decreased pain in L buttocks and back to 5/10 or less with ADLS. 02/15/17   Time 4   Period Weeks   Status New     PT SHORT TERM GOAL #2   Title Patient able to peform supine to sit with 5/10 pain or less. (02/15/17)   Time 4   Period Weeks   Status New     PT SHORT TERM GOAL #3   Title I with initial HEP (02/15/17)   Time 4   Period Weeks   Status New           PT Long Term Goals - 01/18/17 1105      PT LONG TERM GOAL #1   Title I   Time 8   Status New     PT LONG TERM GOAL #2   Title Patient able to peform ADLS with 2/35 pain or less in the left buttock and back.   Time 8   Period Weeks   Status New     PT LONG TERM GOAL #3   Title Patient to demo improved B hip flexion to 4+/5 or better to improve function   Time 8   Period Weeks   Status New     PT LONG TERM GOAL #4   Title Patient able to ambulate in clinic safely 350 ft with least restrictive AD and no LOB.   Time 8   Period Weeks   Status New               Plan - 02/01/17 1043    Clinical Impression Statement Patient presents today with her two daughters. Her pain level persists at same level as last visit. Her daughter reports that she was able to walk better after last treatment, however, as patient has not started antibiotic it is unclear if pain is coming from kidney infection or another source. She responded well to modalities and STW today with increased tolerance to increased pressure. LTGs are ongoing.   Rehab Potential Good   PT Frequency 2x / week   PT Duration 8 weeks   PT  Treatment/Interventions ADLs/Self Care Home Management;Cryotherapy;Air traffic controller;Therapeutic activities;Therapeutic exercise;Balance training;Neuromuscular  re-education;Manual techniques;Patient/family education;Dry needling   PT Next Visit Plan Assess response to AB; Manual therapy to Left low back and buttocks as tolerated; modalities prn; DN to low back and gluteals as indicated. Pain free therex.  Progress to balance/gait once pain tolerable.      Patient will benefit from skilled therapeutic intervention in order to improve the following deficits and impairments:  Pain, Abnormal gait, Postural dysfunction, Decreased strength, Decreased range of motion  Visit Diagnosis: Acute left-sided low back pain without sciatica  Pain in left hip     Problem List Patient Active Problem List   Diagnosis Date Noted  . Overweight (BMI 25.0-29.9) 05/08/2016  . UTI (lower urinary tract infection) 11/12/2015  . DM hyperosmolarity type II, uncontrolled ([place]) 11/12/2015  . Dementia   . Encephalopathy, metabolic 19/16/6060  . S/P right TKA 08/13/2015  . S/P knee replacement 08/13/2015  . Elevated troponin 03/06/2015  . Fever   . History of diabetes mellitus   . Left buttock pain   . Hyperlipidemia 01/11/2015  . GAD (generalized anxiety disorder) 01/11/2015  . Depression 01/11/2015  . GERD (gastroesophageal reflux disease) 01/11/2015  . Osteopenia 07/13/2014  . Obesity (BMI 30-39.9) 06/19/2014  . Carotid stenosis   . Anemia of chronic disease 12/25/2011  . Iron deficiency anemia 05/27/2011  . Diastolic CHF, chronic ([place]) 04/02/2010  . Type 2 diabetes mellitus with insulin therapy ([place]) 10/08/2009  . DYSLIPIDEMIA 10/08/2009  . Essential hypertension 10/08/2009  . Coronary atherosclerosis 10/08/2009  . CAROTID STENOSIS 10/08/2009    Madelyn Flavors PT 02/01/2017, 4:10 PM  [facility] [address] [place], [place], [address] Phone: [phone]   Fax:  [phone]  Name: [name] MRN: [id number] Date of Birth: 02-04-1931

## 2017-02-03 ENCOUNTER — Encounter: Payer: Medicare Other | Admitting: *Deleted

## 2017-02-05 ENCOUNTER — Ambulatory Visit (INDEPENDENT_AMBULATORY_CARE_PROVIDER_SITE_OTHER): Payer: Medicare Other | Admitting: Urology

## 2017-02-05 ENCOUNTER — Telehealth: Payer: Self-pay | Admitting: Family Medicine

## 2017-02-05 ENCOUNTER — Ambulatory Visit: Payer: Medicare Other | Attending: Family Medicine | Admitting: Physical Therapy

## 2017-02-05 ENCOUNTER — Other Ambulatory Visit (INDEPENDENT_AMBULATORY_CARE_PROVIDER_SITE_OTHER): Payer: Medicare Other

## 2017-02-05 DIAGNOSIS — M545 Low back pain, unspecified: Secondary | ICD-10-CM

## 2017-02-05 DIAGNOSIS — G8929 Other chronic pain: Secondary | ICD-10-CM | POA: Diagnosis not present

## 2017-02-05 DIAGNOSIS — N390 Urinary tract infection, site not specified: Secondary | ICD-10-CM | POA: Diagnosis not present

## 2017-02-05 DIAGNOSIS — M25552 Pain in left hip: Secondary | ICD-10-CM | POA: Insufficient documentation

## 2017-02-05 DIAGNOSIS — R293 Abnormal posture: Secondary | ICD-10-CM | POA: Insufficient documentation

## 2017-02-05 DIAGNOSIS — M25661 Stiffness of right knee, not elsewhere classified: Secondary | ICD-10-CM | POA: Diagnosis not present

## 2017-02-05 DIAGNOSIS — M6281 Muscle weakness (generalized): Secondary | ICD-10-CM | POA: Diagnosis not present

## 2017-02-05 DIAGNOSIS — N3946 Mixed incontinence: Secondary | ICD-10-CM

## 2017-02-05 DIAGNOSIS — M25561 Pain in right knee: Secondary | ICD-10-CM | POA: Insufficient documentation

## 2017-02-05 DIAGNOSIS — R2681 Unsteadiness on feet: Secondary | ICD-10-CM | POA: Insufficient documentation

## 2017-02-05 DIAGNOSIS — R260 Ataxic gait: Secondary | ICD-10-CM | POA: Diagnosis not present

## 2017-02-05 DIAGNOSIS — R1032 Left lower quadrant pain: Secondary | ICD-10-CM | POA: Diagnosis not present

## 2017-02-05 DIAGNOSIS — R2689 Other abnormalities of gait and mobility: Secondary | ICD-10-CM | POA: Insufficient documentation

## 2017-02-05 DIAGNOSIS — Z87440 Personal history of urinary (tract) infections: Secondary | ICD-10-CM

## 2017-02-05 LAB — MICROSCOPIC EXAMINATION

## 2017-02-05 LAB — URINALYSIS, COMPLETE
Bilirubin, UA: NEGATIVE
Ketones, UA: NEGATIVE
Leukocytes, UA: NEGATIVE
NITRITE UA: NEGATIVE
Specific Gravity, UA: 1.015 (ref 1.005–1.030)
Urobilinogen, Ur: 0.2 mg/dL (ref 0.2–1.0)
pH, UA: 5.5 (ref 5.0–7.5)

## 2017-02-05 NOTE — Telephone Encounter (Signed)
Patient had urinalysis, it showed it was about the same as when she saw Dr. Wendi Figueroa, he are put in referral to urology for chronic cystitis, please have her follow up with referral, will add culture.

## 2017-02-05 NOTE — Therapy (Signed)
[facility] [place], [place], [address] Phone: [phone]   Fax:  [phone]  Physical Therapy Treatment  Patient Details  Name: [name] MRN: [id number] Date of Birth: October 25, 1931 Referring Provider: Kenn File  Encounter Date: 02/05/2017      PT End of Session - 02/05/17 1024    Visit Number 6   Number of Visits 16   Date for PT Re-Evaluation 03/15/17   PT Start Time 1024   PT Stop Time 1120   PT Time Calculation (min) 56 min   Activity Tolerance Patient tolerated treatment well   Behavior During Therapy [facility] for tasks assessed/performed      Past Medical History:  Diagnosis Date  . Anemia   . Anxiety   . Arthritis    back, knees, and hips  . Asthma    with allergies  . Balance problems   . CAD (coronary artery disease)   . Carotid stenosis   . CHF (congestive heart failure) ([place])    EF preserved Echo 2012  . Chronic kidney infection   . 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 Dementia    "a little"  . Depression   . Diabetes mellitus    type 2  . Furuncle of back, except buttock   . GERD (gastroesophageal reflux disease)   . H/O seasonal allergies   . Hearing loss   . Hypertension   . Iron deficiency anemia 05/27/2011  . Myocardial infarction   . Pneumonia    hx of  . Renal insufficiency   . Shortness of breath dyspnea    with activity  . Speech problem    from stroke  . Swallowing difficulty   . Vertigo    "when sugar gets low"  . Vision loss    left eye-"half blind"    Past Surgical History:  Procedure Laterality Date  . APPENDECTOMY     with hysterectomy  . CATARACT EXTRACTION Bilateral 5 years ago  . CHOLECYSTECTOMY    . CORONARY ANGIOPLASTY     prior to 2006 5 stents  . CORONARY ARTERY BYPASS GRAFT  2006  . I & D of Furuncle  April 2013  . KNEE SURGERY Right   . TOTAL ABDOMINAL  HYSTERECTOMY  ~83 years old   complete, with tumor removal  . TOTAL KNEE ARTHROPLASTY Right 08/13/2015   Procedure: RIGHT  TOTAL KNEE ARTHROPLASTY;  Surgeon: Paralee Cancel, MD;  Location: [facility];  Service: Orthopedics;  Laterality: Right;    There were no vitals filed for this visit.      Subjective Assessment - 02/05/17 1025    Subjective Patient's daughter reports she has not been "hollering" as much since last visit. She states she thinks the AB is working. She's able to lie down with less pain per daughter and is walking better. Her tailbone still hurts. She has appointment with Kidney dr. today. She reports biofreeze helps in her L QL area.   Patient is accompained by: Family member   Pertinent History HTN, DM, asthma, osteoporosis, TKR   Limitations Sitting   How long can you sit comfortably? 1 hour depending on surface   Diagnostic tests MRI, CT scan and xrays taken at [facility] per daughter   Patient Stated Goals get rid of pain   Currently in Pain? Yes   Pain Score 5    Pain Location Back  Pain Orientation Left   Pain Descriptors / Indicators Throbbing   Pain Type Acute pain   Pain Radiating Towards QL to tailbone   Pain Onset More than a month ago                         [facility] Adult PT Treatment/Exercise - 02/05/17 0001      Exercises   Exercises Lumbar     Lumbar Exercises: Stretches   Standing Side Bend 3 reps;30 seconds   Standing Side Bend Limitations Seated left side only     Modalities   Modalities Electrical Stimulation;Moist Heat;Ultrasound     Moist Heat Therapy   Number Minutes Moist Heat 15 Minutes   Moist Heat Location Lumbar Spine     Electrical Stimulation   Electrical Stimulation Location L low back and gluteals   Electrical Stimulation Action IFC   Electrical Stimulation Parameters 80-50 Hz x 15 min   Electrical Stimulation Goals Pain     Ultrasound   Ultrasound Location L lumbar paraspinals   Ultrasound Parameters 1.5  W/cm2 1 mhz cont x 10 min   Ultrasound Goals Pain     Manual Therapy   Manual Therapy Soft tissue mobilization   Soft tissue mobilization to L paraspinals, QL and gluteals                PT Education - 02/05/17 1112    Education provided Yes   Education Details HeP   Person(s) Educated Patient   Methods Explanation;Demonstration;Handout;Tactile cues;Verbal cues   Comprehension Verbalized understanding;Returned demonstration          PT Short Term Goals - 01/18/17 1103      PT SHORT TERM GOAL #1   Title Patient to report decreased pain in L buttocks and back to 5/10 or less with ADLS. 02/15/17   Time 4   Period Weeks   Status New     PT SHORT TERM GOAL #2   Title Patient able to peform supine to sit with 5/10 pain or less. (02/15/17)   Time 4   Period Weeks   Status New     PT SHORT TERM GOAL #3   Title I with initial HEP (02/15/17)   Time 4   Period Weeks   Status New           PT Long Term Goals - 01/18/17 1105      PT LONG TERM GOAL #1   Title I   Time 8   Status New     PT LONG TERM GOAL #2   Title Patient able to peform ADLS with 0/27 pain or less in the left buttock and back.   Time 8   Period Weeks   Status New     PT LONG TERM GOAL #3   Title Patient to demo improved B hip flexion to 4+/5 or better to improve function   Time 8   Period Weeks   Status New     PT LONG TERM GOAL #4   Title Patient able to ambulate in clinic safely 350 ft with least restrictive AD and no LOB.   Time 8   Period Weeks   Status New               Plan - 02/05/17 1113    Clinical Impression Statement Patient did well with todays treatment with no c/o increased pain. She responded well to deep tissue work and to side stretch. Normal  response to modalities.   PT Treatment/Interventions ADLs/Self Care Home Management;Cryotherapy;Air traffic controller;Therapeutic activities;Therapeutic exercise;Balance  training;Neuromuscular re-education;Manual techniques;Patient/family education;Dry needling   PT Next Visit Plan Get results from kidney MD; Manual therapy to Left low back and buttocks as tolerated; modalities prn; DN to low back and gluteals as indicated. Pain free therex.  Progress to balance/gait once pain tolerable.   PT Home Exercise Plan seated lumbar side stretch in chair   Consulted and Agree with Plan of Care Patient;Family member/caregiver      Patient will benefit from skilled therapeutic intervention in order to improve the following deficits and impairments:  Pain, Abnormal gait, Postural dysfunction, Decreased strength, Decreased range of motion  Visit Diagnosis: Acute left-sided low back pain without sciatica     Problem List Patient Active Problem List   Diagnosis Date Noted  . Overweight (BMI 25.0-29.9) 05/08/2016  . UTI (lower urinary tract infection) 11/12/2015  . DM hyperosmolarity type II, uncontrolled ([place]) 11/12/2015  . Dementia   . Encephalopathy, metabolic 29/93/7169  . S/P right TKA 08/13/2015  . S/P knee replacement 08/13/2015  . Elevated troponin 03/06/2015  . Fever   . History of diabetes mellitus   . Left buttock pain   . Hyperlipidemia 01/11/2015  . GAD (generalized anxiety disorder) 01/11/2015  . Depression 01/11/2015  . GERD (gastroesophageal reflux disease) 01/11/2015  . Osteopenia 07/13/2014  . Obesity (BMI 30-39.9) 06/19/2014  . Carotid stenosis   . Anemia of chronic disease 12/25/2011  . Iron deficiency anemia 05/27/2011  . Diastolic CHF, chronic ([place]) 04/02/2010  . Type 2 diabetes mellitus with insulin therapy ([place]) 10/08/2009  . DYSLIPIDEMIA 10/08/2009  . Essential hypertension 10/08/2009  . Coronary atherosclerosis 10/08/2009  . CAROTID STENOSIS 10/08/2009   Madelyn Flavors PT 02/05/2017, 11:34 AM  [facility] [address] [place], [place], [address] Phone: [phone]   Fax:   [phone]  Name: JOURNEE BOBROWSKI MRN: [id number] Date of Birth: Apr 02, 1931

## 2017-02-05 NOTE — Patient Instructions (Signed)
RIB CAGE: Lateral Stretch (Sitting) SIT IN A CHAIR     Inhaling, raise LEFT arm, and lengthen through spine. Exhaling, lean torso toward  Right side, reaching leftt arm toward the right t. Hold position for _30 or more seconds.  Repeat __3_ times, alternating sides. Do _3__ times per day.  Copyright  VHI. All rights reserved.   Madelyn Flavors, PT 02/05/17 [facility] [place], [place], [address] Phone: [phone]   Fax:  [phone]

## 2017-02-08 ENCOUNTER — Ambulatory Visit: Payer: Medicare Other | Admitting: Physical Therapy

## 2017-02-08 ENCOUNTER — Other Ambulatory Visit (INDEPENDENT_AMBULATORY_CARE_PROVIDER_SITE_OTHER): Payer: Medicare Other

## 2017-02-08 DIAGNOSIS — M25552 Pain in left hip: Secondary | ICD-10-CM | POA: Diagnosis not present

## 2017-02-08 DIAGNOSIS — G8929 Other chronic pain: Secondary | ICD-10-CM | POA: Diagnosis not present

## 2017-02-08 DIAGNOSIS — R2681 Unsteadiness on feet: Secondary | ICD-10-CM | POA: Diagnosis not present

## 2017-02-08 DIAGNOSIS — R293 Abnormal posture: Secondary | ICD-10-CM | POA: Diagnosis not present

## 2017-02-08 DIAGNOSIS — M25561 Pain in right knee: Secondary | ICD-10-CM | POA: Diagnosis not present

## 2017-02-08 DIAGNOSIS — R260 Ataxic gait: Secondary | ICD-10-CM | POA: Diagnosis not present

## 2017-02-08 DIAGNOSIS — R2689 Other abnormalities of gait and mobility: Secondary | ICD-10-CM | POA: Diagnosis not present

## 2017-02-08 DIAGNOSIS — M545 Low back pain, unspecified: Secondary | ICD-10-CM

## 2017-02-08 DIAGNOSIS — N390 Urinary tract infection, site not specified: Secondary | ICD-10-CM | POA: Diagnosis not present

## 2017-02-08 DIAGNOSIS — M6281 Muscle weakness (generalized): Secondary | ICD-10-CM | POA: Diagnosis not present

## 2017-02-08 DIAGNOSIS — M25661 Stiffness of right knee, not elsewhere classified: Secondary | ICD-10-CM | POA: Diagnosis not present

## 2017-02-08 DIAGNOSIS — Z87440 Personal history of urinary (tract) infections: Secondary | ICD-10-CM | POA: Diagnosis not present

## 2017-02-08 NOTE — Therapy (Signed)
[facility] [place], [place], [address] Phone: [phone]   Fax:  [phone]  Physical Therapy Treatment  Patient Details  Name: CHAI VERDEJO MRN: [id number] Date of Birth: [birth year] Referring Provider: Kenn File  Encounter Date: 02/08/2017      PT End of Session - 02/08/17 0950    Visit Number 7   Number of Visits 16   Date for PT Re-Evaluation 03/15/17   PT Start Time 0949   PT Stop Time 1045   PT Time Calculation (min) 56 min   Activity Tolerance Patient tolerated treatment well   Behavior During Therapy [facility] for tasks assessed/performed      Past Medical History:  Diagnosis Date  . Anemia   . Anxiety   . Arthritis    back, knees, and hips  . Asthma    with allergies  . Balance problems   . CAD (coronary artery disease)   . Carotid stenosis   . CHF (congestive heart failure) ([place])    EF preserved Echo 2012  . Chronic kidney infection   . 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 Dementia    "a little"  . Depression   . Diabetes mellitus    type 2  . Furuncle of back, except buttock   . GERD (gastroesophageal reflux disease)   . H/O seasonal allergies   . Hearing loss   . Hypertension   . Iron deficiency anemia 05/27/2011  . Myocardial infarction   . Pneumonia    hx of  . Renal insufficiency   . Shortness of breath dyspnea    with activity  . Speech problem    from stroke  . Swallowing difficulty   . Vertigo    "when sugar gets low"  . Vision loss    left eye-"half blind"    Past Surgical History:  Procedure Laterality Date  . APPENDECTOMY     with hysterectomy  . CATARACT EXTRACTION Bilateral 5 years ago  . CHOLECYSTECTOMY    . CORONARY ANGIOPLASTY     prior to 2006 5 stents  . CORONARY ARTERY BYPASS GRAFT  2006  . I & D of Furuncle  April 2013  . KNEE SURGERY Right   . TOTAL ABDOMINAL  HYSTERECTOMY  ~83 years old   complete, with tumor removal  . TOTAL KNEE ARTHROPLASTY Right 08/12/80   Procedure: RIGHT  TOTAL KNEE ARTHROPLASTY;  Surgeon: Paralee Cancel, MD;  Location: [facility];  Service: Orthopedics;  Laterality: Right;    There were no vitals filed for this visit.      Subjective Assessment - 02/08/17 0950    Subjective Paitent was cleared by kidney MD. She had just a little inflammation and will be put on a low dose AB indefinitely. Patient states she picked up a clothes basket this weekend and strained her back again.   Patient is accompained by: Family member   Pertinent History HTN, DM, asthma, osteoporosis, TKR   Limitations Sitting   How long can you sit comfortably? 1 hour depending on surface   Diagnostic tests MRI, CT scan and xrays taken at [facility] per daughter   Patient Stated Goals get rid of pain   Currently in Pain? Yes   Pain Score 5    Pain Location Back   Pain Orientation Left   Pain Descriptors / Indicators Throbbing   Pain Type Acute pain  Pain Radiating Towards QL to tailbone   Pain Onset More than a month ago   Pain Relieving Factors meds, icy hot, hot water bottle   Effect of Pain on Daily Activities limited with ambulation                         [facility] Adult PT Treatment/Exercise - 02/08/17 0001      Modalities   Modalities Electrical Stimulation;Moist Heat     Moist Heat Therapy   Number Minutes Moist Heat 15 Minutes   Moist Heat Location Lumbar Spine     Electrical Stimulation   Electrical Stimulation Location L low back, hip and L quad   Electrical Stimulation Action IFC   Electrical Stimulation Parameters 80-150 Hz x 15 min   Electrical Stimulation Goals Pain     Manual Therapy   Manual Therapy Soft tissue mobilization;Myofascial release   Soft tissue mobilization to L proximal quad, gluteals, QL and lumbar paraspinals   Myofascial Release to L thoracolumbar fascia          Trigger Point Dry  Needling - 02/08/17 1035    Consent Given? Yes   Education Handout Provided No   Muscles Treated Upper Body Quadratus Lumborum  L   Muscles Treated Lower Body Gluteus minimus;Gluteus maximus   Gluteus Maximus Response Twitch response elicited;Palpable increased muscle length   Gluteus Minimus Response Twitch response elicited;Palpable increased muscle length                PT Short Term Goals - 01/18/17 1103      PT SHORT TERM GOAL #1   Title Patient to report decreased pain in L buttocks and back to 5/10 or less with ADLS. 02/15/17   Time 4   Period Weeks   Status New     PT SHORT TERM GOAL #2   Title Patient able to peform supine to sit with 5/10 pain or less. (02/15/17)   Time 4   Period Weeks   Status New     PT SHORT TERM GOAL #3   Title I with initial HEP (02/15/17)   Time 4   Period Weeks   Status New           PT Long Term Goals - 01/18/17 1105      PT LONG TERM GOAL #1   Title I   Time 8   Status New     PT LONG TERM GOAL #2   Title Patient able to peform ADLS with 6/94 pain or less in the left buttock and back.   Time 8   Period Weeks   Status New     PT LONG TERM GOAL #3   Title Patient to demo improved B hip flexion to 4+/5 or better to improve function   Time 8   Period Weeks   Status New     PT LONG TERM GOAL #4   Title Patient able to ambulate in clinic safely 350 ft with least restrictive AD and no LOB.   Time 8   Period Weeks   Status New               Plan - 02/08/17 1217    Clinical Impression Statement Patient presented today with reports of pain about the same however she was much steadier with her walker and did not require any assist standing from the chair to transfer to plinth. She continues to have pain with bed mobility, but  is better once in position. She was able to tolerate both hooklying and SDLY today. She had marked tissue tension in her L quad today which improved with manual therapy. She responded well to  TP DN in her hip and QL as well. Normal response to modalities.   PT Treatment/Interventions ADLs/Self Care Home Management;Cryotherapy;Air traffic controller;Therapeutic activities;Therapeutic exercise;Balance training;Neuromuscular re-education;Manual techniques;Patient/family education;Dry needling   PT Next Visit Plan Assess DN and goals;  Manual therapy to Left low back and buttocks as tolerated; modalities prn; DN to low back and gluteals as indicated. Pain free therex.  Progress to balance/gait once pain tolerable.   PT Home Exercise Plan seated lumbar side stretch in chair      Patient will benefit from skilled therapeutic intervention in order to improve the following deficits and impairments:  Pain, Abnormal gait, Postural dysfunction, Decreased strength, Decreased range of motion  Visit Diagnosis: Acute left-sided low back pain without sciatica  Pain in left hip     Problem List Patient Active Problem List   Diagnosis Date Noted  . Overweight (BMI 25.0-29.9) 05/08/2016  . UTI (lower urinary tract infection) 11/12/2015  . DM hyperosmolarity type II, uncontrolled ([place]) 11/12/2015  . Dementia   . Encephalopathy, metabolic 41/63/8453  . S/P right TKA 08/13/2015  . S/P knee replacement 08/13/2015  . Elevated troponin 03/06/2015  . Fever   . History of diabetes mellitus   . Left buttock pain   . Hyperlipidemia 01/11/2015  . GAD (generalized anxiety disorder) 01/11/2015  . Depression 01/11/2015  . GERD (gastroesophageal reflux disease) 01/11/2015  . Osteopenia 07/13/2014  . Obesity (BMI 30-39.9) 06/19/2014  . Carotid stenosis   . Anemia of chronic disease 12/25/2011  . Iron deficiency anemia 05/27/2011  . Diastolic CHF, chronic ([place]) 04/02/2010  . Type 2 diabetes mellitus with insulin therapy ([place]) 10/08/2009  . DYSLIPIDEMIA 10/08/2009  . Essential hypertension 10/08/2009  . Coronary atherosclerosis 10/08/2009  . CAROTID STENOSIS  10/08/2009    Madelyn Flavors PT 02/08/2017, 12:21 PM  [facility] [address] [place], [place], [address] Phone: [phone]   Fax:  [phone]  Name: JAIANA SHEFFER MRN: [id number] Date of Birth: Apr 16, 1931

## 2017-02-09 LAB — URINE CULTURE: Organism ID, Bacteria: NO GROWTH

## 2017-02-12 ENCOUNTER — Ambulatory Visit: Payer: Medicare Other | Admitting: Physical Therapy

## 2017-02-12 DIAGNOSIS — R2681 Unsteadiness on feet: Secondary | ICD-10-CM

## 2017-02-12 DIAGNOSIS — M6281 Muscle weakness (generalized): Secondary | ICD-10-CM | POA: Diagnosis not present

## 2017-02-12 DIAGNOSIS — M25561 Pain in right knee: Secondary | ICD-10-CM | POA: Diagnosis not present

## 2017-02-12 DIAGNOSIS — M545 Low back pain, unspecified: Secondary | ICD-10-CM

## 2017-02-12 DIAGNOSIS — M25661 Stiffness of right knee, not elsewhere classified: Secondary | ICD-10-CM | POA: Diagnosis not present

## 2017-02-12 DIAGNOSIS — R260 Ataxic gait: Secondary | ICD-10-CM | POA: Diagnosis not present

## 2017-02-12 DIAGNOSIS — G8929 Other chronic pain: Secondary | ICD-10-CM | POA: Diagnosis not present

## 2017-02-12 DIAGNOSIS — M25552 Pain in left hip: Secondary | ICD-10-CM

## 2017-02-12 DIAGNOSIS — R2689 Other abnormalities of gait and mobility: Secondary | ICD-10-CM | POA: Diagnosis not present

## 2017-02-12 DIAGNOSIS — R293 Abnormal posture: Secondary | ICD-10-CM | POA: Diagnosis not present

## 2017-02-12 NOTE — Therapy (Signed)
[facility] [place], [place], [address] Phone: [phone]   Fax:  [phone]  Physical Therapy Treatment  Patient Details  Name: [name] MRN: [id number] Date of Birth: October 19, 1931 Referring Provider: Kenn File  Encounter Date: 02/12/2017      PT End of Session - 02/12/17 0950    Visit Number 8   Number of Visits 16   Date for PT Re-Evaluation 03/15/17   Authorization Type Gcode 10th visit   PT Start Time 0950   PT Stop Time 1047   PT Time Calculation (min) 57 min   Activity Tolerance Patient tolerated treatment well;Patient limited by pain   Behavior During Therapy [facility] for tasks assessed/performed      Past Medical History:  Diagnosis Date  . Anemia   . Anxiety   . Arthritis    back, knees, and hips  . Asthma    with allergies  . Balance problems   . CAD (coronary artery disease)   . Carotid stenosis   . CHF (congestive heart failure) ([place])    EF preserved Echo 2012  . Chronic kidney infection   . 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 Dementia    "a little"  . Depression   . Diabetes mellitus    type 2  . Furuncle of back, except buttock   . GERD (gastroesophageal reflux disease)   . H/O seasonal allergies   . Hearing loss   . Hypertension   . Iron deficiency anemia 05/27/2011  . Myocardial infarction   . Pneumonia    hx of  . Renal insufficiency   . Shortness of breath dyspnea    with activity  . Speech problem    from stroke  . Swallowing difficulty   . Vertigo    "when sugar gets low"  . Vision loss    left eye-"half blind"    Past Surgical History:  Procedure Laterality Date  . APPENDECTOMY     with hysterectomy  . CATARACT EXTRACTION Bilateral 5 years ago  . CHOLECYSTECTOMY    . CORONARY ANGIOPLASTY     prior to 2006 5 stents  . CORONARY ARTERY BYPASS GRAFT  2006  . I & D of  Furuncle  April 2013  . KNEE SURGERY Right   . TOTAL ABDOMINAL HYSTERECTOMY  ~83 years old   complete, with tumor removal  . TOTAL KNEE ARTHROPLASTY Right 08/13/2015   Procedure: RIGHT  TOTAL KNEE ARTHROPLASTY;  Surgeon: Paralee Cancel, MD;  Location: [facility];  Service: Orthopedics;  Laterality: Right;    There were no vitals filed for this visit.      Subjective Assessment - 02/12/17 0950    Subjective Patient reports only mild improvements. She feels she is better when she is warmer and when the weather is warmer. Lying down is better. Sitting to standing is the worst.   Patient is accompained by: Family member   Pertinent History HTN, DM, asthma, osteoporosis, TKR   Limitations Sitting   How long can you sit comfortably? 1 hour depending on surface   Diagnostic tests MRI, CT scan and xrays taken at [facility] per daughter   Patient Stated Goals get rid of pain   Currently in Pain? Yes   Pain Score 3    Pain Location Back   Pain Orientation Left   Pain Descriptors / Indicators Throbbing   Pain Type  Acute pain   Pain Radiating Towards L QL to tailbone   Pain Onset More than a month ago   Pain Frequency Constant   Aggravating Factors  sitting to standing   Pain Relieving Factors meds, icy hot, hot water bottle   Effect of Pain on Daily Activities limited with ambulation                         [facility] Adult PT Treatment/Exercise - 02/12/17 0001      Ambulation/Gait   Ambulation/Gait Yes   Ambulation/Gait Assistance 6: Modified independent (Device/Increase time)   Ambulation Distance (Feet) 154 Feet   Assistive device Rollator   Gait Pattern Decreased weight shift to left;Decreased stance time - left;Trunk flexed   Ambulation Surface Level   Gait Comments SBA     Self-Care   Self-Care Other Self-Care Comments   Other Self-Care Comments  use of tennis ball to release TPs and muscle soreness and reviewed sitting posture      Modalities   Modalities Electrical  Stimulation;Moist Heat     Moist Heat Therapy   Number Minutes Moist Heat 15 Minutes   Moist Heat Location Lumbar Spine     Electrical Stimulation   Electrical Stimulation Location L low back and gluteals   Electrical Stimulation Action IFC   Electrical Stimulation Parameters 80-150 Hz x 15 min   Electrical Stimulation Goals Pain     Ultrasound   Ultrasound Location L gluteals and along L SIJ   Ultrasound Parameters 1.5 w/cm 2 1 MHz cont x 3  had to stop secondary to pain   Ultrasound Goals Pain     Manual Therapy   Manual Therapy Soft tissue mobilization;Myofascial release   Soft tissue mobilization to L gluteals/piriformis and lumbar   Myofascial Release to L gluteals/piriformis and along SIJ                  PT Short Term Goals - 02/12/17 1104      PT SHORT TERM GOAL #1   Title Patient to report decreased pain in L buttocks and back to 5/10 or less with ADLS. 02/15/17   Time 4   Period Weeks   Status Achieved     PT SHORT TERM GOAL #2   Title Patient able to peform supine to sit with 5/10 pain or less. (02/15/17)   Time 4   Period Weeks   Status On-going     PT SHORT TERM GOAL #3   Title I with initial HEP (02/15/17)   Time 4   Period Weeks   Status On-going           PT Long Term Goals - 02/12/17 0954      PT LONG TERM GOAL #1   Title I with HEP   Time 8   Period Weeks   Status On-going     PT LONG TERM GOAL #2   Title Patient able to peform ADLS with 2/35 pain or less in the left buttock and back.   Baseline about 5/10 with ADLS   Time 8   Period Weeks   Status On-going     PT LONG TERM GOAL #3   Title Patient to demo improved B hip flexion to 4+/5 or better to improve function   Baseline L hip flex 4/5, R 4/5 but difficult to resist due to pain with stabilization   Time 8   Period Weeks   Status On-going  PT LONG TERM GOAL #4   Title Patient able to ambulate in clinic safely 350 ft with least restrictive AD and no LOB.   Time 8    Period Weeks   Status On-going               Plan - 02/12/17 6226    Clinical Impression Statement Patient presents today with reports of improved pain overall. She has met one STG for pain. She is able to perform sit to stand with CGA which is a significant improvement from IE. She ambulated well today requiring some VCs to keep closer to walker. Patient had to stop after 154 feet secondary to increased pain in coccyx area. She also had pain in R sidelying when knees and hips were flexed. She did not tolerate [place] today when it was close to distal SIJ so [place] was stopped. She responded well to MFR and STW in same area. Patient reports her pain is also in the front left abdomen. She has pain in L side with active R hip flexion, but not with L hip flexion or resisted L hip flexion. Goals remain ongoing. Her daughter expressed frustration today with patient that "she tries to take advantage of me" and that she needs to move more. Patient reported to PT that she is up a lot during the day. She continues to grimace with bed mobility and intermittently with amb.    PT Frequency 2x / week   PT Duration 8 weeks   PT Treatment/Interventions ADLs/Self Care Home Management;Cryotherapy;Air traffic controller;Therapeutic activities;Therapeutic exercise;Balance training;Neuromuscular re-education;Manual techniques;Patient/family education;Dry needling   PT Next Visit Plan Continue with DN; Manual therapy to Left low back and buttocks as tolerated; modalities prn; Attempt Pain free therex.  Progress to balance/gait once pain tolerable.   PT Home Exercise Plan seated lumbar side stretch in chair   Consulted and Agree with Plan of Care Patient;Family member/caregiver   Family Member Consulted Daughter      Patient will benefit from skilled therapeutic intervention in order to improve the following deficits and impairments:  Pain, Abnormal gait, Postural dysfunction,  Decreased strength, Decreased range of motion  Visit Diagnosis: Acute left-sided low back pain without sciatica  Pain in left hip  Unsteadiness on feet     Problem List Patient Active Problem List   Diagnosis Date Noted  . Overweight (BMI 25.0-29.9) 05/08/2016  . UTI (lower urinary tract infection) 11/12/2015  . DM hyperosmolarity type II, uncontrolled ([place]) 11/12/2015  . Dementia   . Encephalopathy, metabolic 33/35/4562  . S/P right TKA 08/13/2015  . S/P knee replacement 08/13/2015  . Elevated troponin 03/06/2015  . Fever   . History of diabetes mellitus   . Left buttock pain   . Hyperlipidemia 01/11/2015  . GAD (generalized anxiety disorder) 01/11/2015  . Depression 01/11/2015  . GERD (gastroesophageal reflux disease) 01/11/2015  . Osteopenia 07/13/2014  . Obesity (BMI 30-39.9) 06/19/2014  . Carotid stenosis   . Anemia of chronic disease 12/25/2011  . Iron deficiency anemia 05/27/2011  . Diastolic CHF, chronic ([place]) 04/02/2010  . Type 2 diabetes mellitus with insulin therapy ([place]) 10/08/2009  . DYSLIPIDEMIA 10/08/2009  . Essential hypertension 10/08/2009  . Coronary atherosclerosis 10/08/2009  . CAROTID STENOSIS 10/08/2009    Madelyn Flavors PT 02/12/2017, 11:05 AM  [facility] [address] [place], [place], [address] Phone: [phone]   Fax:  [phone]  Name: ANNMARIE PLEMMONS MRN: [id number] Date of Birth: [birth year]

## 2017-02-15 ENCOUNTER — Encounter: Payer: Medicare Other | Admitting: *Deleted

## 2017-02-15 ENCOUNTER — Encounter: Payer: Medicare Other | Admitting: Physical Therapy

## 2017-02-17 ENCOUNTER — Other Ambulatory Visit: Payer: Self-pay

## 2017-02-17 DIAGNOSIS — F411 Generalized anxiety disorder: Secondary | ICD-10-CM

## 2017-02-17 NOTE — Telephone Encounter (Signed)
Patient last seen in office on 01-29-17. Rx last filled on 12-02-17 for #20 with 1 RF. Please advise and route to pool A so nurse can phone in to pharmacy

## 2017-02-18 MED ORDER — LORAZEPAM 0.5 MG PO TABS: ORAL_TABLET | ORAL | 1 refills | Status: DC

## 2017-02-19 ENCOUNTER — Encounter: Payer: Self-pay | Admitting: Physical Therapy

## 2017-02-19 ENCOUNTER — Ambulatory Visit: Payer: Medicare Other | Admitting: Physical Therapy

## 2017-02-19 DIAGNOSIS — M25561 Pain in right knee: Secondary | ICD-10-CM | POA: Diagnosis not present

## 2017-02-19 DIAGNOSIS — M6281 Muscle weakness (generalized): Secondary | ICD-10-CM

## 2017-02-19 DIAGNOSIS — M25552 Pain in left hip: Secondary | ICD-10-CM | POA: Diagnosis not present

## 2017-02-19 DIAGNOSIS — M545 Low back pain, unspecified: Secondary | ICD-10-CM

## 2017-02-19 DIAGNOSIS — R2681 Unsteadiness on feet: Secondary | ICD-10-CM | POA: Diagnosis not present

## 2017-02-19 DIAGNOSIS — R293 Abnormal posture: Secondary | ICD-10-CM | POA: Diagnosis not present

## 2017-02-19 DIAGNOSIS — G8929 Other chronic pain: Secondary | ICD-10-CM

## 2017-02-19 DIAGNOSIS — R260 Ataxic gait: Secondary | ICD-10-CM

## 2017-02-19 DIAGNOSIS — M25661 Stiffness of right knee, not elsewhere classified: Secondary | ICD-10-CM | POA: Diagnosis not present

## 2017-02-19 DIAGNOSIS — R2689 Other abnormalities of gait and mobility: Secondary | ICD-10-CM

## 2017-02-19 NOTE — Therapy (Signed)
[facility] [place], [place], [address] Phone: [phone]   Fax:  [phone]  Physical Therapy Treatment  Patient Details  Name: [name] MRN: [id number] Date of Birth: [birth year] Referring Provider: Kenn File  Encounter Date: 02/19/2017      PT End of Session - 02/19/17 1212    Visit Number 9   Number of Visits 16   Date for PT Re-Evaluation 03/15/17   Authorization Type Gcode 10th visit   PT Start Time 0945   PT Stop Time 1030   PT Time Calculation (min) 45 min   Activity Tolerance Patient tolerated treatment well   Behavior During Therapy [facility] for tasks assessed/performed      Past Medical History:  Diagnosis Date  . Anemia   . Anxiety   . Arthritis    back, knees, and hips  . Asthma    with allergies  . Balance problems   . CAD (coronary artery disease)   . Carotid stenosis   . CHF (congestive heart failure) ([place])    EF preserved Echo 2012  . Chronic kidney infection   . 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 Dementia    "a little"  . Depression   . Diabetes mellitus    type 2  . Furuncle of back, except buttock   . GERD (gastroesophageal reflux disease)   . H/O seasonal allergies   . Hearing loss   . Hypertension   . Iron deficiency anemia 05/27/2011  . Myocardial infarction   . Pneumonia    hx of  . Renal insufficiency   . Shortness of breath dyspnea    with activity  . Speech problem    from stroke  . Swallowing difficulty   . Vertigo    "when sugar gets low"  . Vision loss    left eye-"half blind"    Past Surgical History:  Procedure Laterality Date  . APPENDECTOMY     with hysterectomy  . CATARACT EXTRACTION Bilateral 5 years ago  . CHOLECYSTECTOMY    . CORONARY ANGIOPLASTY     prior to 2006 5 stents  . CORONARY ARTERY BYPASS GRAFT  2006  . I & D of Furuncle  April 2013  .  KNEE SURGERY Right   . TOTAL ABDOMINAL HYSTERECTOMY  ~83 years old   complete, with tumor removal  . TOTAL KNEE ARTHROPLASTY Right 08/13/2015   Procedure: RIGHT  TOTAL KNEE ARTHROPLASTY;  Surgeon: Paralee Cancel, MD;  Location: [facility];  Service: Orthopedics;  Laterality: Right;    There were no vitals filed for this visit.                       [place] Adult PT Treatment/Exercise - 02/19/17 0001      Lumbar Exercises: Supine   Clam 15 reps;2 seconds   Clam Limitations yellow theraband 15 reps, and red theraband 15 reps     Knee/Hip Exercises: Supine   Bridges Limitations attempted bridges, pt unable to hold and maintain lift.    Straight Leg Raises 15 reps   Other Supine Knee/Hip Exercises ball squeezes x 15 reps     Modalities   Modalities Electrical Stimulation     Moist Heat Therapy   Number Minutes Moist Heat 15 Minutes   Moist Heat Location Lumbar Spine     Electrical Stimulation   Electrical Stimulation Location  left low back and gluteals   Electrical Stimulation Action IFC   Electrical Stimulation Parameters 80-150 Hz x 15 minutes   Electrical Stimulation Goals Pain     Manual Therapy   Manual Therapy Soft tissue mobilization   Soft tissue mobilization to L gulteals, lumbar and sacral paraspinals and L IT band   Myofascial Release to L gluteals/piriformis and along SIJ                PT Education - 02/19/17 1209    Education provided Yes   Education Details Issued pt Yellow and Red theraband for seated UE and LE strengthening exercises (bicep curls, shoulder abduction/ER, hip abduction, hip flexion)   Person(s) Educated Patient;Child(ren)  pt's daughter   Methods Explanation;Demonstration;Tactile cues;Verbal cues   Comprehension Verbalized understanding;Returned demonstration;Verbal cues required          PT Short Term Goals - 02/12/17 1104      PT SHORT TERM GOAL #1   Title Patient to report decreased pain in L buttocks and back to  5/10 or less with ADLS. 02/15/17   Time 4   Period Weeks   Status Achieved     PT SHORT TERM GOAL #2   Title Patient able to peform supine to sit with 5/10 pain or less. (02/15/17)   Time 4   Period Weeks   Status On-going     PT SHORT TERM GOAL #3   Title I with initial HEP (02/15/17)   Time 4   Period Weeks   Status On-going           PT Long Term Goals - 02/19/17 1218      PT LONG TERM GOAL #1   Title I with HEP   Time 8   Period Weeks   Status On-going     PT LONG TERM GOAL #2   Title Patient able to peform ADLS with 6/44 pain or less in the left buttock and back.   Baseline about 5/10 with ADLS   Time 8   Period Weeks   Status On-going     PT LONG TERM GOAL #3   Title Patient to demo improved B hip flexion to 4+/5 or better to improve function   Baseline L hip flex 4/5, R 4/5 but difficult to resist due to pain with stabilization   Time 8   Period Weeks   Status On-going     PT LONG TERM GOAL #4   Title Patient able to ambulate in clinic safely 350 ft with least restrictive AD and no LOB.   Time 8   Period Weeks   Status On-going               Plan - 02/19/17 1212    Clinical Impression Statement Patient presents today with her daughter. Pt with no new goals met. Pt is still working on sit to stand at home with her daughter. Pt also issued yellow and red theraband today for UE and LE strengthening exercises. Pt's daugher was also educated. Pt with limited activity by pain in her L  lower back and L hip. Pt and daughter encouraged to perform exercises at home to improve function and mobility.    Rehab Potential Good   PT Frequency 2x / week   PT Duration 8 weeks   PT Treatment/Interventions ADLs/Self Care Home Management;Cryotherapy;Air traffic controller;Therapeutic activities;Therapeutic exercise;Balance training;Neuromuscular re-education;Manual techniques;Patient/family education;Dry needling   PT Next Visit  Plan Continue with DN; Manual therapy to Left  low back and buttocks as tolerated; modalities prn; Attempt Pain free therex.  Progress to balance/gait once pain tolerable.   PT Home Exercise Plan seated lumbar side stretch in chair   Consulted and Agree with Plan of Care Patient;Family member/caregiver   Family Member Consulted Daughter      Patient will benefit from skilled therapeutic intervention in order to improve the following deficits and impairments:  Pain, Abnormal gait, Postural dysfunction, Decreased strength, Decreased range of motion  Visit Diagnosis: Acute left-sided low back pain without sciatica  Pain in left hip  Unsteadiness on feet  Ataxic gait  Other abnormalities of gait and mobility  Muscle weakness (generalized)  Abnormal posture  Knee stiffness, right  Chronic pain of right knee     Problem List Patient Active Problem List   Diagnosis Date Noted  . Overweight (BMI 25.0-29.9) 05/08/2016  . UTI (lower urinary tract infection) 11/12/2015  . DM hyperosmolarity type II, uncontrolled ([place]) 11/12/2015  . Dementia   . Encephalopathy, metabolic 05/07/5614  . S/P right TKA 08/13/2015  . S/P knee replacement 08/13/2015  . Elevated troponin 03/06/2015  . Fever   . History of diabetes mellitus   . Left buttock pain   . Hyperlipidemia 01/11/2015  . GAD (generalized anxiety disorder) 01/11/2015  . Depression 01/11/2015  . GERD (gastroesophageal reflux disease) 01/11/2015  . Osteopenia 07/13/2014  . Obesity (BMI 30-39.9) 06/19/2014  . Carotid stenosis   . Anemia of chronic disease 12/25/2011  . Iron deficiency anemia 05/27/2011  . Diastolic CHF, chronic ([place]) 04/02/2010  . Type 2 diabetes mellitus with insulin therapy ([place]) 10/08/2009  . DYSLIPIDEMIA 10/08/2009  . Essential hypertension 10/08/2009  . Coronary atherosclerosis 10/08/2009  . CAROTID STENOSIS 10/08/2009    Oretha Caprice, MPT 02/19/2017, 12:19 PM  [facility] [address] [place], [place], [address] Phone: [phone]   Fax:  [phone]  Name: [name] MRN: [id number] Date of Birth: 1931-02-03

## 2017-02-22 ENCOUNTER — Encounter: Payer: Self-pay | Admitting: Physical Therapy

## 2017-02-22 ENCOUNTER — Ambulatory Visit: Payer: Medicare Other | Admitting: Physical Therapy

## 2017-02-22 DIAGNOSIS — M25561 Pain in right knee: Secondary | ICD-10-CM | POA: Diagnosis not present

## 2017-02-22 DIAGNOSIS — M25661 Stiffness of right knee, not elsewhere classified: Secondary | ICD-10-CM | POA: Diagnosis not present

## 2017-02-22 DIAGNOSIS — M545 Low back pain, unspecified: Secondary | ICD-10-CM

## 2017-02-22 DIAGNOSIS — M25552 Pain in left hip: Secondary | ICD-10-CM | POA: Diagnosis not present

## 2017-02-22 DIAGNOSIS — R260 Ataxic gait: Secondary | ICD-10-CM | POA: Diagnosis not present

## 2017-02-22 DIAGNOSIS — M6281 Muscle weakness (generalized): Secondary | ICD-10-CM | POA: Diagnosis not present

## 2017-02-22 DIAGNOSIS — R2681 Unsteadiness on feet: Secondary | ICD-10-CM | POA: Diagnosis not present

## 2017-02-22 DIAGNOSIS — R2689 Other abnormalities of gait and mobility: Secondary | ICD-10-CM | POA: Diagnosis not present

## 2017-02-22 DIAGNOSIS — G8929 Other chronic pain: Secondary | ICD-10-CM | POA: Diagnosis not present

## 2017-02-22 DIAGNOSIS — R293 Abnormal posture: Secondary | ICD-10-CM | POA: Diagnosis not present

## 2017-02-22 NOTE — Therapy (Signed)
[facility] [place], [place], [address] Phone: [phone]   Fax:  [phone]  Physical Therapy Treatment  Patient Details  Name: [name] MRN: [id number] Date of Birth: [birth year] Referring Provider: Kenn File  Encounter Date: 02/22/2017      PT End of Session - 02/22/17 0903    Visit Number 10   Number of Visits 16   Date for PT Re-Evaluation 03/15/17   Authorization Type Gcode 10th visit   PT Start Time 0903   PT Stop Time 0958   PT Time Calculation (min) 55 min   Activity Tolerance Patient tolerated treatment well;Patient limited by pain   Behavior During Therapy [facility] for tasks assessed/performed      Past Medical History:  Diagnosis Date  . Anemia   . Anxiety   . Arthritis    back, knees, and hips  . Asthma    with allergies  . Balance problems   . CAD (coronary artery disease)   . Carotid stenosis   . CHF (congestive heart failure) ([place])    EF preserved Echo 2012  . Chronic kidney infection   . 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 Dementia    "a little"  . Depression   . Diabetes mellitus    type 2  . Furuncle of back, except buttock   . GERD (gastroesophageal reflux disease)   . H/O seasonal allergies   . Hearing loss   . Hypertension   . Iron deficiency anemia 05/27/2011  . Myocardial infarction   . Pneumonia    hx of  . Renal insufficiency   . Shortness of breath dyspnea    with activity  . Speech problem    from stroke  . Swallowing difficulty   . Vertigo    "when sugar gets low"  . Vision loss    left eye-"half blind"    Past Surgical History:  Procedure Laterality Date  . APPENDECTOMY     with hysterectomy  . CATARACT EXTRACTION Bilateral 5 years ago  . CHOLECYSTECTOMY    . CORONARY ANGIOPLASTY     prior to 2006 5 stents  . CORONARY ARTERY BYPASS GRAFT  2006  . I & D of  Furuncle  April 2013  . KNEE SURGERY Right   . TOTAL ABDOMINAL HYSTERECTOMY  ~83 years old   complete, with tumor removal  . TOTAL KNEE ARTHROPLASTY Right 08/13/2015   Procedure: RIGHT  TOTAL KNEE ARTHROPLASTY;  Surgeon: Paralee Cancel, MD;  Location: [facility];  Service: Orthopedics;  Laterality: Right;    There were no vitals filed for this visit.      Subjective Assessment - 02/22/17 0904    Subjective Patient states she was a little sore after exercises. She continues to c/o pain in L side into abdomen.   Patient is accompained by: Family member   Pertinent History HTN, DM, asthma, osteoporosis, TKR   Limitations Sitting   How long can you sit comfortably? 1 hour depending on surface   Diagnostic tests MRI, CT scan and xrays taken at [facility] per daughter   Patient Stated Goals get rid of pain   Currently in Pain? Yes   Pain Score 5    Pain Location Back   Pain Orientation Left   Pain Descriptors / Indicators Throbbing   Pain Type Acute pain   Pain Onset More than a month ago  [facility] PT Assessment - 02/22/17 0001      Observation/Other Assessments   Focus on Therapeutic Outcomes (FOTO)  64% limited                     [facility] Adult PT Treatment/Exercise - 02/22/17 0001      Lumbar Exercises: Supine   Clam 20 reps  2x10   Clam Limitations yellow theraband 15 reps, and red theraband 15 reps   Bridge 20 reps  2x 10     Knee/Hip Exercises: Supine   Straight Leg Raises Strengthening;Both;10 reps;2 sets   Other Supine Knee/Hip Exercises ball squeezes x 15 reps 5 sec hold   Other Supine Knee/Hip Exercises 1x 10 B marching with yellow band resistance in hooklying     Knee/Hip Exercises: Sidelying   Clams 5 reps left side - too painful in left low back     Modalities   Modalities Electrical Stimulation;Moist Heat     Moist Heat Therapy   Number Minutes Moist Heat 15 Minutes   Moist Heat Location Lumbar Spine     Electrical Stimulation    Electrical Stimulation Location Left low back and hip   Electrical Stimulation Action IFC   Electrical Stimulation Parameters 80-150 Hz x 15 min   Electrical Stimulation Goals Pain                  PT Short Term Goals - 02/12/17 1104      PT SHORT TERM GOAL #1   Title Patient to report decreased pain in L buttocks and back to 5/10 or less with ADLS. 02/15/17   Time 4   Period Weeks   Status Achieved     PT SHORT TERM GOAL #2   Title Patient able to peform supine to sit with 5/10 pain or less. (02/15/17)   Time 4   Period Weeks   Status On-going     PT SHORT TERM GOAL #3   Title I with initial HEP (02/15/17)   Time 4   Period Weeks   Status On-going           PT Long Term Goals - 02/19/17 1218      PT LONG TERM GOAL #1   Title I with HEP   Time 8   Period Weeks   Status On-going     PT LONG TERM GOAL #2   Title Patient able to peform ADLS with 8/34 pain or less in the left buttock and back.   Baseline about 5/10 with ADLS   Time 8   Period Weeks   Status On-going     PT LONG TERM GOAL #3   Title Patient to demo improved B hip flexion to 4+/5 or better to improve function   Baseline L hip flex 4/5, R 4/5 but difficult to resist due to pain with stabilization   Time 8   Period Weeks   Status On-going     PT LONG TERM GOAL #4   Title Patient able to ambulate in clinic safely 350 ft with least restrictive AD and no LOB.   Time 8   Period Weeks   Status On-going               Plan - 02/22/17 1253    Clinical Impression Statement Patient did fairly well with TE today. She was unable to tolerate standing activities in the parallel bars and R SDLY clams, but did well with all supine exercises with no c/o  increased pain.   PT Treatment/Interventions ADLs/Self Care Home Management;Cryotherapy;Air traffic controller;Therapeutic activities;Therapeutic exercise;Balance training;Neuromuscular re-education;Manual  techniques;Patient/family education;Dry needling   PT Next Visit Plan Continue with pain free therex.  Progress to balance/gait once pain tolerable.      Patient will benefit from skilled therapeutic intervention in order to improve the following deficits and impairments:  Pain, Abnormal gait, Postural dysfunction, Decreased strength, Decreased range of motion  Visit Diagnosis: Acute left-sided low back pain without sciatica  Pain in left hip  Unsteadiness on feet       G-Codes - 2017-02-25 1255    Functional Assessment Tool Used (Outpatient Only) FOTO 64% LIMITED   Functional Limitation Mobility: Walking and moving around   Mobility: Walking and Moving Around Current Status [phone]) At least 60 percent but less than 80 percent impaired, limited or restricted   Mobility: Walking and Moving Around Goal Status [phone]) At least 40 percent but less than 60 percent impaired, limited or restricted      Problem List Patient Active Problem List   Diagnosis Date Noted  . Overweight (BMI 25.0-29.9) 05/08/2016  . UTI (lower urinary tract infection) 11/12/2015  . DM hyperosmolarity type II, uncontrolled ([place]) 11/12/2015  . Dementia   . Encephalopathy, metabolic 80/32/1224  . S/P right TKA 08/13/2015  . S/P knee replacement 08/13/2015  . Elevated troponin 03/06/2015  . Fever   . History of diabetes mellitus   . Left buttock pain   . Hyperlipidemia 01/11/2015  . GAD (generalized anxiety disorder) 01/11/2015  . Depression 01/11/2015  . GERD (gastroesophageal reflux disease) 01/11/2015  . Osteopenia 07/13/2014  . Obesity (BMI 30-39.9) 06/19/2014  . Carotid stenosis   . Anemia of chronic disease 12/25/2011  . Iron deficiency anemia 05/27/2011  . Diastolic CHF, chronic ([place]) 04/02/2010  . Type 2 diabetes mellitus with insulin therapy ([place]) 10/08/2009  . DYSLIPIDEMIA 10/08/2009  . Essential hypertension 10/08/2009  . Coronary atherosclerosis 10/08/2009  . CAROTID STENOSIS 10/08/2009    Madelyn Flavors PT 2017/02/25, 12:56 PM  [facility] [address] [place], [place], [address] Phone: [phone]   Fax:  [phone]  Name: [name] MRN: [id number] Date of Birth: 12-Apr-1931

## 2017-02-25 ENCOUNTER — Other Ambulatory Visit: Payer: Self-pay | Admitting: Family Medicine

## 2017-02-26 ENCOUNTER — Ambulatory Visit: Payer: Medicare Other | Admitting: Physical Therapy

## 2017-02-26 ENCOUNTER — Encounter: Payer: Self-pay | Admitting: Physical Therapy

## 2017-02-26 DIAGNOSIS — R2681 Unsteadiness on feet: Secondary | ICD-10-CM | POA: Diagnosis not present

## 2017-02-26 DIAGNOSIS — R260 Ataxic gait: Secondary | ICD-10-CM | POA: Diagnosis not present

## 2017-02-26 DIAGNOSIS — M6281 Muscle weakness (generalized): Secondary | ICD-10-CM

## 2017-02-26 DIAGNOSIS — M25661 Stiffness of right knee, not elsewhere classified: Secondary | ICD-10-CM

## 2017-02-26 DIAGNOSIS — R2689 Other abnormalities of gait and mobility: Secondary | ICD-10-CM | POA: Diagnosis not present

## 2017-02-26 DIAGNOSIS — M545 Low back pain, unspecified: Secondary | ICD-10-CM

## 2017-02-26 DIAGNOSIS — G8929 Other chronic pain: Secondary | ICD-10-CM | POA: Diagnosis not present

## 2017-02-26 DIAGNOSIS — R293 Abnormal posture: Secondary | ICD-10-CM

## 2017-02-26 DIAGNOSIS — M25552 Pain in left hip: Secondary | ICD-10-CM

## 2017-02-26 DIAGNOSIS — M25561 Pain in right knee: Secondary | ICD-10-CM | POA: Diagnosis not present

## 2017-02-26 NOTE — Therapy (Signed)
[facility] [place], [place], [address] Phone: [phone]   Fax:  [phone]  Physical Therapy Treatment  Patient Details  Name: [name] MRN: [id number] Date of Birth: 1931/05/19 Referring Provider: Kenn File  Encounter Date: 02/26/2017      PT End of Session - 02/26/17 0916    Visit Number 11   Number of Visits 16   Date for PT Re-Evaluation 03/15/17   Authorization Type Gcode 10th visit   PT Start Time 0900   PT Stop Time 0945   PT Time Calculation (min) 45 min   Activity Tolerance Patient tolerated treatment well   Behavior During Therapy [facility] for tasks assessed/performed      Past Medical History:  Diagnosis Date  . Anemia   . Anxiety   . Arthritis    back, knees, and hips  . Asthma    with allergies  . Balance problems   . CAD (coronary artery disease)   . Carotid stenosis   . CHF (congestive heart failure) ([place])    EF preserved Echo 2012  . Chronic kidney infection   . 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 Dementia    "a little"  . Depression   . Diabetes mellitus    type 2  . Furuncle of back, except buttock   . GERD (gastroesophageal reflux disease)   . H/O seasonal allergies   . Hearing loss   . Hypertension   . Iron deficiency anemia 05/27/2011  . Myocardial infarction   . Pneumonia    hx of  . Renal insufficiency   . Shortness of breath dyspnea    with activity  . Speech problem    from stroke  . Swallowing difficulty   . Vertigo    "when sugar gets low"  . Vision loss    left eye-"half blind"    Past Surgical History:  Procedure Laterality Date  . APPENDECTOMY     with hysterectomy  . CATARACT EXTRACTION Bilateral 5 years ago  . CHOLECYSTECTOMY    . CORONARY ANGIOPLASTY     prior to 2006 5 stents  . CORONARY ARTERY BYPASS GRAFT  2006  . I & D of Furuncle  April 2013  .  KNEE SURGERY Right   . TOTAL ABDOMINAL HYSTERECTOMY  ~83 years old   complete, with tumor removal  . TOTAL KNEE ARTHROPLASTY Right 08/13/2015   Procedure: RIGHT  TOTAL KNEE ARTHROPLASTY;  Surgeon: Paralee Cancel, MD;  Location: [facility];  Service: Orthopedics;  Laterality: Right;    There were no vitals filed for this visit.      Subjective Assessment - 02/26/17 0910    Subjective Patient arriving to therapy today amb in her 3 wheeled walker reporting soreness in left side and left knee. Patient reporting she was packing up some clothes and she thinks she over did it.    Patient is accompained by: Family member   Pertinent History HTN, DM, asthma, osteoporosis, TKR   Limitations Sitting   How long can you sit comfortably? 1 hour depending on surface   Diagnostic tests MRI, CT scan and xrays taken at [facility] per daughter   Patient Stated Goals get rid of pain   Pain Score 3    Pain Location Hip   Pain Orientation Left   Pain Descriptors / Indicators Sore   Pain Type Acute pain  Pain Radiating Towards L QL down thing to knee   Pain Onset More than a month ago   Pain Frequency Constant   Aggravating Factors  sitting to stand transitions, lifting   Pain Relieving Factors meds, icy hot, hot water bottle   Effect of Pain on Daily Activities difficulty amb and household chores                         [facility] Adult PT Treatment/Exercise - 02/26/17 0001      Lumbar Exercises: Seated   Sit to Stand 10 reps   Sit to Stand Limitations clam shells with yellow theraband x 20, bolster squeezes x 20 reps     Lumbar Exercises: Supine   Clam --  2x10     Knee/Hip Exercises: Standing   Heel Raises 15 reps   Hip Flexion 10 reps                PT Education - 02/26/17 0915    Education provided Yes   Education Details reviewed exercises with theraband from last sessions.   Person(s) Educated Patient   Methods Explanation;Demonstration   Comprehension Verbalized  understanding;Returned demonstration;Verbal cues required          PT Short Term Goals - 02/12/17 1104      PT SHORT TERM GOAL #1   Title Patient to report decreased pain in L buttocks and back to 5/10 or less with ADLS. 02/15/17   Time 4   Period Weeks   Status Achieved     PT SHORT TERM GOAL #2   Title Patient able to peform supine to sit with 5/10 pain or less. (02/15/17)   Time 4   Period Weeks   Status On-going     PT SHORT TERM GOAL #3   Title I with initial HEP (02/15/17)   Time 4   Period Weeks   Status On-going           PT Long Term Goals - 02/26/17 0949      PT LONG TERM GOAL #1   Title I with HEP   Time 8   Period Weeks   Status On-going     PT LONG TERM GOAL #2   Title Patient able to peform ADLS with 0/81 pain or less in the left buttock and back.   Baseline about 5/10 with ADLS   Time 8   Period Weeks   Status On-going     PT LONG TERM GOAL #3   Title Patient to demo improved B hip flexion to 4+/5 or better to improve function   Baseline L hip flex 4/5, R 4/5 but difficult to resist due to pain with stabilization   Time 8   Period Weeks   Status On-going     PT LONG TERM GOAL #4   Title Patient able to ambulate in clinic safely 350 ft with least restrictive AD and no LOB.   Time 8   Period Weeks   Status On-going               Plan - 02/26/17 5784    Clinical Impression Statement Patient tolerated well, complaining of hip pain with standing exercises. Pt progressing with therapy and reporting feeling stronger. Pt reviewed seated theraband exercises that were issued last session. Continue with skilled PT to progress toward goals set.    Rehab Potential Good   PT Frequency 2x / week   PT Duration 8 weeks  PT Treatment/Interventions ADLs/Self Care Home Management;Cryotherapy;Air traffic controller;Therapeutic activities;Therapeutic exercise;Balance training;Neuromuscular re-education;Manual  techniques;Patient/family education;Dry needling   PT Next Visit Plan Continue with pain free therex.  Progress to balance/gait once pain tolerable. Progress standing exercises as pt tolerates.    PT Home Exercise Plan seated lumbar side stretch in chair   Consulted and Agree with Plan of Care Patient      Patient will benefit from skilled therapeutic intervention in order to improve the following deficits and impairments:  Pain, Abnormal gait, Postural dysfunction, Decreased strength, Decreased range of motion  Visit Diagnosis: Acute left-sided low back pain without sciatica  Pain in left hip  Unsteadiness on feet  Ataxic gait  Other abnormalities of gait and mobility  Abnormal posture  Muscle weakness (generalized)  Knee stiffness, right     Problem List Patient Active Problem List   Diagnosis Date Noted  . Overweight (BMI 25.0-29.9) 05/08/2016  . UTI (lower urinary tract infection) 11/12/2015  . DM hyperosmolarity type II, uncontrolled ([place]) 11/12/2015  . Dementia   . Encephalopathy, metabolic 29/93/7169  . S/P right TKA 08/13/2015  . S/P knee replacement 08/13/2015  . Elevated troponin 03/06/2015  . Fever   . History of diabetes mellitus   . Left buttock pain   . Hyperlipidemia 01/11/2015  . GAD (generalized anxiety disorder) 01/11/2015  . Depression 01/11/2015  . GERD (gastroesophageal reflux disease) 01/11/2015  . Osteopenia 07/13/2014  . Obesity (BMI 30-39.9) 06/19/2014  . Carotid stenosis   . Anemia of chronic disease 12/25/2011  . Iron deficiency anemia 05/27/2011  . Diastolic CHF, chronic ([place]) 04/02/2010  . Type 2 diabetes mellitus with insulin therapy ([place]) 10/08/2009  . DYSLIPIDEMIA 10/08/2009  . Essential hypertension 10/08/2009  . Coronary atherosclerosis 10/08/2009  . CAROTID STENOSIS 10/08/2009    Oretha Caprice , MPT 02/26/2017, 9:51 AM  [facility] [address] [place], [place],  [address] Phone: [phone]   Fax:  [phone]  Name: [name] MRN: [id number] Date of Birth: [birth year]

## 2017-03-01 ENCOUNTER — Ambulatory Visit: Payer: Medicare Other | Admitting: Physical Therapy

## 2017-03-01 DIAGNOSIS — R2689 Other abnormalities of gait and mobility: Secondary | ICD-10-CM | POA: Diagnosis not present

## 2017-03-01 DIAGNOSIS — M25561 Pain in right knee: Secondary | ICD-10-CM | POA: Diagnosis not present

## 2017-03-01 DIAGNOSIS — R293 Abnormal posture: Secondary | ICD-10-CM | POA: Diagnosis not present

## 2017-03-01 DIAGNOSIS — M25552 Pain in left hip: Secondary | ICD-10-CM | POA: Diagnosis not present

## 2017-03-01 DIAGNOSIS — G8929 Other chronic pain: Secondary | ICD-10-CM | POA: Diagnosis not present

## 2017-03-01 DIAGNOSIS — R2681 Unsteadiness on feet: Secondary | ICD-10-CM | POA: Diagnosis not present

## 2017-03-01 DIAGNOSIS — M25661 Stiffness of right knee, not elsewhere classified: Secondary | ICD-10-CM | POA: Diagnosis not present

## 2017-03-01 DIAGNOSIS — M545 Low back pain, unspecified: Secondary | ICD-10-CM

## 2017-03-01 DIAGNOSIS — R260 Ataxic gait: Secondary | ICD-10-CM | POA: Diagnosis not present

## 2017-03-01 DIAGNOSIS — M6281 Muscle weakness (generalized): Secondary | ICD-10-CM | POA: Diagnosis not present

## 2017-03-01 NOTE — Therapy (Signed)
[facility] [place], [place], [address] Phone: [phone]   Fax:  [phone]  Physical Therapy Treatment  Patient Details  Name: [name] MRN: [id number] Date of Birth: 1931/07/11 Referring Provider: Kenn File  Encounter Date: 03/01/2017      PT End of Session - 03/01/17 0952    Visit Number 12   Number of Visits 16   Date for PT Re-Evaluation 03/15/17   Authorization Type Gcode 10th visit   PT Start Time 0950   PT Stop Time 1030   PT Time Calculation (min) 40 min   Activity Tolerance Patient tolerated treatment well;Patient limited by pain   Behavior During Therapy [facility] for tasks assessed/performed      Past Medical History:  Diagnosis Date  . Anemia   . Anxiety   . Arthritis    back, knees, and hips  . Asthma    with allergies  . Balance problems   . CAD (coronary artery disease)   . Carotid stenosis   . CHF (congestive heart failure) ([place])    EF preserved Echo 2012  . Chronic kidney infection   . 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 Dementia    "a little"  . Depression   . Diabetes mellitus    type 2  . Furuncle of back, except buttock   . GERD (gastroesophageal reflux disease)   . H/O seasonal allergies   . Hearing loss   . Hypertension   . Iron deficiency anemia 05/27/2011  . Myocardial infarction   . Pneumonia    hx of  . Renal insufficiency   . Shortness of breath dyspnea    with activity  . Speech problem    from stroke  . Swallowing difficulty   . Vertigo    "when sugar gets low"  . Vision loss    left eye-"half blind"    Past Surgical History:  Procedure Laterality Date  . APPENDECTOMY     with hysterectomy  . CATARACT EXTRACTION Bilateral 5 years ago  . CHOLECYSTECTOMY    . CORONARY ANGIOPLASTY     prior to 2006 5 stents  . CORONARY ARTERY BYPASS GRAFT  2006  . I & D of  Furuncle  April 2013  . KNEE SURGERY Right   . TOTAL ABDOMINAL HYSTERECTOMY  ~83 years old   complete, with tumor removal  . TOTAL KNEE ARTHROPLASTY Right 08/13/2015   Procedure: RIGHT  TOTAL KNEE ARTHROPLASTY;  Surgeon: Paralee Cancel, MD;  Location: [facility];  Service: Orthopedics;  Laterality: Right;    There were no vitals filed for this visit.      Subjective Assessment - 03/01/17 0956    Subjective Patient presents today with c/o increased pain in L side to 8/10.   Patient is accompained by: Family member   Pertinent History HTN, DM, asthma, osteoporosis, TKR   Limitations Sitting   How long can you sit comfortably? 1 hour depending on surface   Diagnostic tests MRI, CT scan and xrays taken at [facility] per daughter   Patient Stated Goals get rid of pain   Currently in Pain? Yes   Pain Score 8    Pain Location Hip   Pain Orientation Right   Pain Descriptors / Indicators Sore   Pain Type Acute pain   Pain Radiating Towards L QL down thing to knee   Pain Onset More  than a month ago   Pain Frequency Constant   Aggravating Factors  sitting to stand transitions, lifting   Pain Relieving Factors meds, icy hot, hot water bottle   Effect of Pain on Daily Activities dificulty amb and with household chores                         [facility] Adult PT Treatment/Exercise - 03/01/17 0001      Lumbar Exercises: Seated   Hip Flexion on Ball Limitations attempted in chair but too painful in L side   Sit to Stand 10 reps   Sit to Stand Limitations clam shells with yellow theraband x 20, bolster squeezes x 20 reps     Knee/Hip Exercises: Aerobic   Nustep L3 x 18 min     Knee/Hip Exercises: Standing   Heel Raises 10 reps   Hip Flexion Limitations attempted but too painful; substituted Nustep which patient tolerated                  PT Short Term Goals - 02/12/17 1104      PT SHORT TERM GOAL #1   Title Patient to report decreased pain in L buttocks and back to  5/10 or less with ADLS. 02/15/17   Time 4   Period Weeks   Status Achieved     PT SHORT TERM GOAL #2   Title Patient able to peform supine to sit with 5/10 pain or less. (02/15/17)   Time 4   Period Weeks   Status On-going     PT SHORT TERM GOAL #3   Title I with initial HEP (02/15/17)   Time 4   Period Weeks   Status On-going           PT Long Term Goals - 02/26/17 0949      PT LONG TERM GOAL #1   Title I with HEP   Time 8   Period Weeks   Status On-going     PT LONG TERM GOAL #2   Title Patient able to peform ADLS with 4/40 pain or less in the left buttock and back.   Baseline about 5/10 with ADLS   Time 8   Period Weeks   Status On-going     PT LONG TERM GOAL #3   Title Patient to demo improved B hip flexion to 4+/5 or better to improve function   Baseline L hip flex 4/5, R 4/5 but difficult to resist due to pain with stabilization   Time 8   Period Weeks   Status On-going     PT LONG TERM GOAL #4   Title Patient able to ambulate in clinic safely 350 ft with least restrictive AD and no LOB.   Time 8   Period Weeks   Status On-going               Plan - 03/01/17 1143    Clinical Impression Statement Patient presented today with increased pain in her left side. She reported increased pain with many of the exercises including seated hip ABD and sitting and standing hip flexion. She was able to tolerate Nustep at end of treatment and reported afterwards that her pain was much better and that she felt she was walking better. Estim was held today.   PT Treatment/Interventions ADLs/Self Care Home Management;Cryotherapy;Air traffic controller;Therapeutic activities;Therapeutic exercise;Balance training;Neuromuscular re-education;Manual techniques;Patient/family education;Dry needling   PT Next Visit Plan Continue with pain free therex.  Progress to balance/gait once pain tolerable. Progress standing exercises as pt tolerates.        Patient will benefit from skilled therapeutic intervention in order to improve the following deficits and impairments:  Pain, Abnormal gait, Postural dysfunction, Decreased strength, Decreased range of motion  Visit Diagnosis: Acute left-sided low back pain without sciatica  Pain in left hip     Problem List Patient Active Problem List   Diagnosis Date Noted  . Overweight (BMI 25.0-29.9) 05/08/2016  . UTI (lower urinary tract infection) 11/12/2015  . DM hyperosmolarity type II, uncontrolled ([place]) 11/12/2015  . Dementia   . Encephalopathy, metabolic 54/62/7035  . S/P right TKA 08/13/2015  . S/P knee replacement 08/13/2015  . Elevated troponin 03/06/2015  . Fever   . History of diabetes mellitus   . Left buttock pain   . Hyperlipidemia 01/11/2015  . GAD (generalized anxiety disorder) 01/11/2015  . Depression 01/11/2015  . GERD (gastroesophageal reflux disease) 01/11/2015  . Osteopenia 07/13/2014  . Obesity (BMI 30-39.9) 06/19/2014  . Carotid stenosis   . Anemia of chronic disease 12/25/2011  . Iron deficiency anemia 05/27/2011  . Diastolic CHF, chronic ([place]) 04/02/2010  . Type 2 diabetes mellitus with insulin therapy ([place]) 10/08/2009  . DYSLIPIDEMIA 10/08/2009  . Essential hypertension 10/08/2009  . Coronary atherosclerosis 10/08/2009  . CAROTID STENOSIS 10/08/2009    Madelyn Flavors PT 03/01/2017, 11:54 AM  [facility] [address] [place], [place], [address] Phone: [phone]   Fax:  [phone]  Name: [name] MRN: [id number] Date of Birth: 1931/09/24

## 2017-03-04 ENCOUNTER — Ambulatory Visit: Payer: Medicare Other | Admitting: Physical Therapy

## 2017-03-04 DIAGNOSIS — M25661 Stiffness of right knee, not elsewhere classified: Secondary | ICD-10-CM | POA: Diagnosis not present

## 2017-03-04 DIAGNOSIS — R260 Ataxic gait: Secondary | ICD-10-CM | POA: Diagnosis not present

## 2017-03-04 DIAGNOSIS — M545 Low back pain, unspecified: Secondary | ICD-10-CM

## 2017-03-04 DIAGNOSIS — M25552 Pain in left hip: Secondary | ICD-10-CM | POA: Diagnosis not present

## 2017-03-04 DIAGNOSIS — M6281 Muscle weakness (generalized): Secondary | ICD-10-CM | POA: Diagnosis not present

## 2017-03-04 DIAGNOSIS — M25561 Pain in right knee: Secondary | ICD-10-CM | POA: Diagnosis not present

## 2017-03-04 DIAGNOSIS — R293 Abnormal posture: Secondary | ICD-10-CM | POA: Diagnosis not present

## 2017-03-04 DIAGNOSIS — R2681 Unsteadiness on feet: Secondary | ICD-10-CM | POA: Diagnosis not present

## 2017-03-04 DIAGNOSIS — R2689 Other abnormalities of gait and mobility: Secondary | ICD-10-CM | POA: Diagnosis not present

## 2017-03-04 DIAGNOSIS — G8929 Other chronic pain: Secondary | ICD-10-CM | POA: Diagnosis not present

## 2017-03-04 NOTE — Therapy (Signed)
[facility] [place], [place], [address] Phone: [phone]   Fax:  [phone]  Physical Therapy Treatment  Patient Details  Name: [name] MRN: [id number] Date of Birth: Aug 12, 1931 Referring Provider: Kenn File  Encounter Date: 03/04/2017      PT End of Session - 03/04/17 1159    Visit Number 13   Number of Visits 16   Date for PT Re-Evaluation 03/15/17   PT Start Time 1032   PT Stop Time 1120   PT Time Calculation (min) 48 min      Past Medical History:  Diagnosis Date  . Anemia   . Anxiety   . Arthritis    back, knees, and hips  . Asthma    with allergies  . Balance problems   . CAD (coronary artery disease)   . Carotid stenosis   . CHF (congestive heart failure) ([place])    EF preserved Echo 2012  . Chronic kidney infection   . 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 Dementia    "a little"  . Depression   . Diabetes mellitus    type 2  . Furuncle of back, except buttock   . GERD (gastroesophageal reflux disease)   . H/O seasonal allergies   . Hearing loss   . Hypertension   . Iron deficiency anemia 05/27/2011  . Myocardial infarction   . Pneumonia    hx of  . Renal insufficiency   . Shortness of breath dyspnea    with activity  . Speech problem    from stroke  . Swallowing difficulty   . Vertigo    "when sugar gets low"  . Vision loss    left eye-"half blind"    Past Surgical History:  Procedure Laterality Date  . APPENDECTOMY     with hysterectomy  . CATARACT EXTRACTION Bilateral 5 years ago  . CHOLECYSTECTOMY    . CORONARY ANGIOPLASTY     prior to 2006 5 stents  . CORONARY ARTERY BYPASS GRAFT  2006  . I & D of Furuncle  April 2013  . KNEE SURGERY Right   . TOTAL ABDOMINAL HYSTERECTOMY  ~83 years old   complete, with tumor removal  . TOTAL KNEE ARTHROPLASTY Right 08/13/2015   Procedure:  RIGHT  TOTAL KNEE ARTHROPLASTY;  Surgeon: Paralee Cancel, MD;  Location: [facility];  Service: Orthopedics;  Laterality: Right;    There were no vitals filed for this visit.      Subjective Assessment - 03/04/17 1200    Subjective Pain still high.   Pain Score 8    Pain Location Back   Pain Orientation Left   Pain Descriptors / Indicators Aching   Pain Type Acute pain   Pain Onset More than a month ago                         [facility] Adult PT Treatment/Exercise - 03/04/17 0001      Modalities   Modalities Electrical Stimulation;Moist Heat;Ultrasound     Moist Heat Therapy   Number Minutes Moist Heat 15 Minutes   Moist Heat Location Lumbar Spine     Electrical Stimulation   Electrical Stimulation Location Left low back.   Electrical Stimulation Action Pre-mod   Electrical Stimulation Parameters 80-150 Hz x 15 minutes.   Electrical Stimulation Goals Pain     Ultrasound  Ultrasound Location Left low back.   Ultrasound Parameters 1.50 W/CM2 x 12 minutes.   Ultrasound Goals Pain     Manual Therapy   Manual Therapy Soft tissue mobilization   Soft tissue mobilization STW/M x 11 minutes including left QL release technique.                  PT Short Term Goals - 02/12/17 1104      PT SHORT TERM GOAL #1   Title Patient to report decreased pain in L buttocks and back to 5/10 or less with ADLS. 02/15/17   Time 4   Period Weeks   Status Achieved     PT SHORT TERM GOAL #2   Title Patient able to peform supine to sit with 5/10 pain or less. (02/15/17)   Time 4   Period Weeks   Status On-going     PT SHORT TERM GOAL #3   Title I with initial HEP (02/15/17)   Time 4   Period Weeks   Status On-going           PT Long Term Goals - 02/26/17 0949      PT LONG TERM GOAL #1   Title I with HEP   Time 8   Period Weeks   Status On-going     PT LONG TERM GOAL #2   Title Patient able to peform ADLS with 9/56 pain or less in the left buttock and back.    Baseline about 5/10 with ADLS   Time 8   Period Weeks   Status On-going     PT LONG TERM GOAL #3   Title Patient to demo improved B hip flexion to 4+/5 or better to improve function   Baseline L hip flex 4/5, R 4/5 but difficult to resist due to pain with stabilization   Time 8   Period Weeks   Status On-going     PT LONG TERM GOAL #4   Title Patient able to ambulate in clinic safely 350 ft with least restrictive AD and no LOB.   Time 8   Period Weeks   Status On-going               Plan - 03/04/17 1209    Clinical Impression Statement Patient in a lot pain today rated at 8/10.  Active trigger points in patient's left QL.      Patient will benefit from skilled therapeutic intervention in order to improve the following deficits and impairments:  Pain, Abnormal gait, Postural dysfunction, Decreased strength, Decreased range of motion  Visit Diagnosis: Acute left-sided low back pain without sciatica  Pain in left hip     Problem List Patient Active Problem List   Diagnosis Date Noted  . Overweight (BMI 25.0-29.9) 05/08/2016  . UTI (lower urinary tract infection) 11/12/2015  . DM hyperosmolarity type II, uncontrolled ([place]) 11/12/2015  . Dementia   . Encephalopathy, metabolic 21/30/8657  . S/P right TKA 08/13/2015  . S/P knee replacement 08/13/2015  . Elevated troponin 03/06/2015  . Fever   . History of diabetes mellitus   . Left buttock pain   . Hyperlipidemia 01/11/2015  . GAD (generalized anxiety disorder) 01/11/2015  . Depression 01/11/2015  . GERD (gastroesophageal reflux disease) 01/11/2015  . Osteopenia 07/13/2014  . Obesity (BMI 30-39.9) 06/19/2014  . Carotid stenosis   . Anemia of chronic disease 12/25/2011  . Iron deficiency anemia 05/27/2011  . Diastolic CHF, chronic ([place]) 04/02/2010  . Type 2 diabetes mellitus  with insulin therapy ([place]) 10/08/2009  . DYSLIPIDEMIA 10/08/2009  . Essential hypertension 10/08/2009  . Coronary atherosclerosis  10/08/2009  . CAROTID STENOSIS 10/08/2009    Shamonique Battiste, [place] MPT 03/04/2017, 12:15 PM  [facility] [place], [place], [address] Phone: [phone]   Fax:  [phone]  Name: [name] MRN: [id number] Date of Birth: Nov 10, 1931

## 2017-03-09 ENCOUNTER — Ambulatory Visit: Payer: Medicare Other | Attending: Family Medicine | Admitting: Physical Therapy

## 2017-03-09 DIAGNOSIS — M545 Low back pain, unspecified: Secondary | ICD-10-CM

## 2017-03-09 DIAGNOSIS — M25552 Pain in left hip: Secondary | ICD-10-CM

## 2017-03-09 NOTE — Therapy (Signed)
[facility] [place], [place], [address] Phone: [phone]   Fax:  [phone]  Physical Therapy Treatment  Patient Details  Name: [name] MRN: [id number] Date of Birth: Oct 24, 1931 Referring Provider: Kenn File  Encounter Date: 03/09/2017      PT End of Session - 03/09/17 1035    Visit Number 14   Number of Visits 16   Date for PT Re-Evaluation 03/15/17   Authorization Type Gcode 20th visit   PT Start Time 1032   PT Stop Time 1115   PT Time Calculation (min) 43 min   Activity Tolerance Patient limited by pain   Behavior During Therapy [facility] for tasks assessed/performed      Past Medical History:  Diagnosis Date  . Anemia   . Anxiety   . Arthritis    back, knees, and hips  . Asthma    with allergies  . Balance problems   . CAD (coronary artery disease)   . Carotid stenosis   . CHF (congestive heart failure) ([place])    EF preserved Echo 2012  . Chronic kidney infection   . 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 Dementia    "a little"  . Depression   . Diabetes mellitus    type 2  . Furuncle of back, except buttock   . GERD (gastroesophageal reflux disease)   . H/O seasonal allergies   . Hearing loss   . Hypertension   . Iron deficiency anemia 05/27/2011  . Myocardial infarction   . Pneumonia    hx of  . Renal insufficiency   . Shortness of breath dyspnea    with activity  . Speech problem    from stroke  . Swallowing difficulty   . Vertigo    "when sugar gets low"  . Vision loss    left eye-"half blind"    Past Surgical History:  Procedure Laterality Date  . APPENDECTOMY     with hysterectomy  . CATARACT EXTRACTION Bilateral 5 years ago  . CHOLECYSTECTOMY    . CORONARY ANGIOPLASTY     prior to 2006 5 stents  . CORONARY ARTERY BYPASS GRAFT  2006  . I & D of Furuncle  April 2013  . KNEE SURGERY  Right   . TOTAL ABDOMINAL HYSTERECTOMY  ~83 years old   complete, with tumor removal  . TOTAL KNEE ARTHROPLASTY Right 08/13/2015   Procedure: RIGHT  TOTAL KNEE ARTHROPLASTY;  Surgeon: Paralee Cancel, MD;  Location: [facility];  Service: Orthopedics;  Laterality: Right;    There were no vitals filed for this visit.      Subjective Assessment - 03/09/17 1036    Subjective Patient reports her pain is 10/10 today. She questioned whether or not an injection might benefit her.   Patient is accompained by: Family member   Pertinent History HTN, DM, asthma, osteoporosis, TKR   Patient Stated Goals get rid of pain   Currently in Pain? Yes   Pain Score 10-Worst pain ever   Pain Location Back   Pain Orientation Left   Pain Descriptors / Indicators Aching   Pain Type Acute pain                         [facility] Adult PT Treatment/Exercise - 03/09/17 0001      Knee/Hip Exercises: Aerobic   Nustep L4x 15  Modalities   Modalities Electrical Stimulation;Moist Heat     Moist Heat Therapy   Number Minutes Moist Heat 15 Minutes   Moist Heat Location Lumbar Spine  and sacrum     Electrical Stimulation   Electrical Stimulation Location L lumbar and sacrum   Electrical Stimulation Action premod   Electrical Stimulation Parameters 80-150 Hz x 15 min   Electrical Stimulation Goals Pain     Manual Therapy   Manual Therapy Soft tissue mobilization   Manual therapy comments Attempted STW x 2 min, but patient could not tolerate today.                  PT Short Term Goals - 02/12/17 1104      PT SHORT TERM GOAL #1   Title Patient to report decreased pain in L buttocks and back to 5/10 or less with ADLS. 02/15/17   Time 4   Period Weeks   Status Achieved     PT SHORT TERM GOAL #2   Title Patient able to peform supine to sit with 5/10 pain or less. (02/15/17)   Time 4   Period Weeks   Status On-going     PT SHORT TERM GOAL #3   Title I with initial HEP (02/15/17)    Time 4   Period Weeks   Status On-going           PT Long Term Goals - 02/26/17 0949      PT LONG TERM GOAL #1   Title I with HEP   Time 8   Period Weeks   Status On-going     PT LONG TERM GOAL #2   Title Patient able to peform ADLS with 5/03 pain or less in the left buttock and back.   Baseline about 5/10 with ADLS   Time 8   Period Weeks   Status On-going     PT LONG TERM GOAL #3   Title Patient to demo improved B hip flexion to 4+/5 or better to improve function   Baseline L hip flex 4/5, R 4/5 but difficult to resist due to pain with stabilization   Time 8   Period Weeks   Status On-going     PT LONG TERM GOAL #4   Title Patient able to ambulate in clinic safely 350 ft with least restrictive AD and no LOB.   Time 8   Period Weeks   Status On-going               Plan - 03/09/17 1107    Clinical Impression Statement Patient presents today with c/o 10/10 pain. She was able to tolerate Nustep which was utilized after reports of decreased pain last visit after using it. Patient was monitored throughout and reported no increase in pain. Her gait today was antalgic with decreased stance time on LLE. STW was attmepted to L low back and gluteals, but patient could not tolerate. PT discussed current status with patient and her daughter and let them know that she was not progressing and was not benefitting any further from skilled PT. Plan to refer back to MD due to pain levels. She would likely benefit from a continued exercise program such as silver sneakers.    PT Treatment/Interventions ADLs/Self Care Home Management;Cryotherapy;Air traffic controller;Therapeutic activities;Therapeutic exercise;Balance training;Neuromuscular re-education;Manual techniques;Patient/family education;Dry needling   PT Next Visit Plan Assess goals, refer back to MD, finalize HEP.   Consulted and Agree with Plan of Care Patient  Patient will benefit  from skilled therapeutic intervention in order to improve the following deficits and impairments:  Pain, Abnormal gait, Postural dysfunction, Decreased strength, Decreased range of motion  Visit Diagnosis: Acute left-sided low back pain without sciatica  Pain in left hip     Problem List Patient Active Problem List   Diagnosis Date Noted  . Overweight (BMI 25.0-29.9) 05/08/2016  . UTI (lower urinary tract infection) 11/12/2015  . DM hyperosmolarity type II, uncontrolled ([place]) 11/12/2015  . Dementia   . Encephalopathy, metabolic 54/27/0623  . S/P right TKA 08/13/2015  . S/P knee replacement 08/13/2015  . Elevated troponin 03/06/2015  . Fever   . History of diabetes mellitus   . Left buttock pain   . Hyperlipidemia 01/11/2015  . GAD (generalized anxiety disorder) 01/11/2015  . Depression 01/11/2015  . GERD (gastroesophageal reflux disease) 01/11/2015  . Osteopenia 07/13/2014  . Obesity (BMI 30-39.9) 06/19/2014  . Carotid stenosis   . Anemia of chronic disease 12/25/2011  . Iron deficiency anemia 05/27/2011  . Diastolic CHF, chronic ([place]) 04/02/2010  . Type 2 diabetes mellitus with insulin therapy ([place]) 10/08/2009  . DYSLIPIDEMIA 10/08/2009  . Essential hypertension 10/08/2009  . Coronary atherosclerosis 10/08/2009  . CAROTID STENOSIS 10/08/2009    Madelyn Flavors PT 03/09/2017, 12:41 PM  [facility] [address] [place], [place], [address] Phone: [phone]   Fax:  [phone]  Name: SEVILLE BRICK MRN: [id number] Date of Birth: June 08, 1931

## 2017-03-11 ENCOUNTER — Ambulatory Visit: Payer: Medicare Other | Admitting: Physical Therapy

## 2017-03-11 ENCOUNTER — Encounter: Payer: Self-pay | Admitting: Physical Therapy

## 2017-03-11 DIAGNOSIS — M25552 Pain in left hip: Secondary | ICD-10-CM | POA: Diagnosis not present

## 2017-03-11 DIAGNOSIS — M545 Low back pain, unspecified: Secondary | ICD-10-CM

## 2017-03-11 NOTE — Therapy (Signed)
[facility] [place], [place], [address] Phone: [phone]   Fax:  [phone]  Physical Therapy Treatment/ Discharge Summary  Patient Details  Name: [name] MRN: [id number] Date of Birth: Dec 26, 1930 Referring Provider: Kenn File  Encounter Date: 03/11/2017      PT End of Session - 03/11/17 1019    Visit Number 15   Number of Visits 16   Date for PT Re-Evaluation 03/15/17   Authorization Type Gcode 20th visit   PT Start Time [phone]   PT Stop Time 1036   PT Time Calculation (min) 50 min   Activity Tolerance Patient limited by pain;Patient tolerated treatment well   Behavior During Therapy [facility] for tasks assessed/performed      Past Medical History:  Diagnosis Date  . Anemia   . Anxiety   . Arthritis    back, knees, and hips  . Asthma    with allergies  . Balance problems   . CAD (coronary artery disease)   . Carotid stenosis   . CHF (congestive heart failure) ([place])    EF preserved Echo 2012  . Chronic kidney infection   . 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 Dementia    "a little"  . Depression   . Diabetes mellitus    type 2  . Furuncle of back, except buttock   . GERD (gastroesophageal reflux disease)   . H/O seasonal allergies   . Hearing loss   . Hypertension   . Iron deficiency anemia 05/27/2011  . Myocardial infarction   . Pneumonia    hx of  . Renal insufficiency   . Shortness of breath dyspnea    with activity  . Speech problem    from stroke  . Swallowing difficulty   . Vertigo    "when sugar gets low"  . Vision loss    left eye-"half blind"    Past Surgical History:  Procedure Laterality Date  . APPENDECTOMY     with hysterectomy  . CATARACT EXTRACTION Bilateral 5 years ago  . CHOLECYSTECTOMY    . CORONARY ANGIOPLASTY     prior to 2006 5 stents  . CORONARY ARTERY BYPASS GRAFT   2006  . I & D of Furuncle  April 2013  . KNEE SURGERY Right   . TOTAL ABDOMINAL HYSTERECTOMY  ~83 years old   complete, with tumor removal  . TOTAL KNEE ARTHROPLASTY Right 08/13/2015   Procedure: RIGHT  TOTAL KNEE ARTHROPLASTY;  Surgeon: Paralee Cancel, MD;  Location: [facility];  Service: Orthopedics;  Laterality: Right;    There were no vitals filed for this visit.      Subjective Assessment - 03/11/17 0953    Subjective Patient continues to report a lot of pain and notinging has helped thus far   Patient is accompained by: Family member   Pertinent History HTN, DM, asthma, osteoporosis, TKR   Limitations Sitting   How long can you sit comfortably? 1 hour depending on surface   Diagnostic tests MRI, CT scan and xrays taken at [facility] per daughter   Patient Stated Goals get rid of pain   Currently in Pain? Yes   Pain Score 10-Worst pain ever   Pain Location Back   Pain Orientation Left   Pain Descriptors / Indicators Aching   Pain Type Acute pain   Pain Onset More than a month ago  Pain Frequency Constant   Aggravating Factors  sitting to standing transitions or lifting   Pain Relieving Factors meds or heat                         [facility] Adult PT Treatment/Exercise - 03/11/17 0001      Knee/Hip Exercises: Aerobic   Nustep L4x 15 UE/LE posture focus, monitored for progression     Moist Heat Therapy   Number Minutes Moist Heat 15 Minutes   Moist Heat Location Lumbar Spine     Electrical Stimulation   Electrical Stimulation Location L lumbar and sacrum   Electrical Stimulation Action premod   Electrical Stimulation Parameters 80-150hz  x42mn   Electrical Stimulation Goals Pain     Manual Therapy   Manual Therapy Soft tissue mobilization   Soft tissue mobilization gentle manual STW to left low back                 PT Education - 03/11/17 1027    Education provided Yes   Education Details HEP posture and core activation   Person(s) Educated  Patient   Methods Explanation;Demonstration;Handout   Comprehension Verbalized understanding;Returned demonstration          PT Short Term Goals - 03/11/17 1021      PT SHORT TERM GOAL #1   Title Patient to report decreased pain in L buttocks and back to 5/10 or less with ADLS. 02/15/17   Time 4   Period Weeks   Status Achieved     PT SHORT TERM GOAL #2   Title Patient able to peform supine to sit with 5/10 pain or less. (02/15/17)   Time 4   Period Weeks   Status Not Met     PT SHORT TERM GOAL #3   Title I with initial HEP (02/15/17)   Time 4   Period Weeks   Status Achieved           PT Long Term Goals - 03/11/17 1023      PT LONG TERM GOAL #1   Title I with HEP   Time 8   Period Weeks   Status Not Met     PT LONG TERM GOAL #2   Title Patient able to peform ADLS with 35/40pain or less in the left buttock and back.   Baseline about 5/10 with ADLS   Time 8   Period Weeks   Status Not Met     PT LONG TERM GOAL #3   Title Patient to demo improved B hip flexion to 4+/5 or better to improve function   Baseline L hip flex 4/5, R 4/5 but difficult to resist due to pain with stabilization   Time 8   Period Weeks   Status Not Met     PT LONG TERM GOAL #4   Title Patient able to ambulate in clinic safely 350 ft with least restrictive AD and no LOB.   Time 8   Period Weeks   Status Not Met               Plan - 03/11/17 1023    Clinical Impression Statement Patient tolerated treatment fairly well yet has ongoing pain in left low back. Patient has temporary relief from meds and heat thus far. Patient unable to meet any remaining goals due to pain limitations. Patient will DC today and return to MD per MPT.    Rehab Potential Good   PT Frequency  2x / week   PT Duration 8 weeks   PT Treatment/Interventions ADLs/Self Care Home Management;Cryotherapy;Air traffic controller;Therapeutic activities;Therapeutic exercise;Balance  training;Neuromuscular re-education;Manual techniques;Patient/family education;Dry needling   PT Next Visit Plan DC   Consulted and Agree with Plan of Care Patient      Patient will benefit from skilled therapeutic intervention in order to improve the following deficits and impairments:  Pain, Abnormal gait, Postural dysfunction, Decreased strength, Decreased range of motion  Visit Diagnosis: Acute left-sided low back pain without sciatica  Pain in left hip     Problem List Patient Active Problem List   Diagnosis Date Noted  . Overweight (BMI 25.0-29.9) 05/08/2016  . UTI (lower urinary tract infection) 11/12/2015  . DM hyperosmolarity type II, uncontrolled ([place]) 11/12/2015  . Dementia   . Encephalopathy, metabolic 70/92/9574  . S/P right TKA 08/13/2015  . S/P knee replacement 08/13/2015  . Elevated troponin 03/06/2015  . Fever   . History of diabetes mellitus   . Left buttock pain   . Hyperlipidemia 01/11/2015  . GAD (generalized anxiety disorder) 01/11/2015  . Depression 01/11/2015  . GERD (gastroesophageal reflux disease) 01/11/2015  . Osteopenia 07/13/2014  . Obesity (BMI 30-39.9) 06/19/2014  . Carotid stenosis   . Anemia of chronic disease 12/25/2011  . Iron deficiency anemia 05/27/2011  . Diastolic CHF, chronic ([place]) 04/02/2010  . Type 2 diabetes mellitus with insulin therapy ([place]) 10/08/2009  . DYSLIPIDEMIA 10/08/2009  . Essential hypertension 10/08/2009  . Coronary atherosclerosis 10/08/2009  . CAROTID STENOSIS 10/08/2009   PHYSICAL THERAPY DISCHARGE SUMMARY  Visits from Start of Care: 3 Current functional level related to goals / functional outcomes: Pt reporting no change in functional status and will be returning to her MD. Pt requesting to be discharged from therapy at present time. No goals met after 3 visits.    Remaining deficits: See above, no goals met   Education / Equipment: See above  Plan: Patient agrees to discharge.  Patient goals were  not met. Patient is being discharged due to the patient's request.  ?????    Kearney Hard, MPT    Ladean Raya, [place] 03/11/17 10:41 AM  [facility] [place], [place], [address] Phone: [phone]   Fax:  [phone]  Name: [name] MRN: [id number] Date of Birth: Aug 17, 1931

## 2017-03-11 NOTE — Patient Instructions (Signed)
Brushing Teeth    Place one foot on ledge and one hand on counter. Bend other knee slightly to keep back straight.  Copyright  VHI. All rights reserved.  Refrigerator   Squat with knees apart to reach lower shelves and drawers.   Sleeping on Side   Place pillow between knees. Use cervical support under neck and a roll around waist as needed.   Copyright  VHI. All rights reserved.  Log Roll   Lying on back, bend left knee and place left arm across chest. Roll all in one movement to the right. Reverse to roll to the left. Always move as one unit.   Copyright  VHI. All rights reserved.  Stand to Sit / Sit to Stand   To sit: Bend knees to lower self onto front edge of chair, then scoot back on seat. To stand: Reverse sequence by placing one foot forward, and scoot to front of seat. Use rocking motion to stand up.  Copyright  VHI. All rights reserved.  Posture - Standing   Good posture is important. Avoid slouching and forward head thrust. Maintain curve in low back and align ears over shoul- ders, hips over ankles.   Copyright  VHI. All rights reserved.  Posture - Sitting   Sit upright, head facing forward. Try using a roll to support lower back. Keep shoulders relaxed, and avoid rounded back. Keep hips level with knees. Avoid crossing legs for long periods. Pelvic Tilt: Posterior - Legs Bent (Supine)   Tighten stomach and flatten back by rolling pelvis down. Hold _10___ seconds. Relax. Repeat _10-30___ times per set. Do __2__ sets per session. Do _2___ sessions per day.

## 2017-03-12 ENCOUNTER — Encounter: Payer: Self-pay | Admitting: Family Medicine

## 2017-03-12 ENCOUNTER — Ambulatory Visit (INDEPENDENT_AMBULATORY_CARE_PROVIDER_SITE_OTHER): Payer: Medicare Other | Admitting: Family Medicine

## 2017-03-12 VITALS — BP 174/69 | HR 60 | Temp 97.0°F | Ht 66.0 in | Wt 150.4 lb

## 2017-03-12 DIAGNOSIS — R109 Unspecified abdominal pain: Secondary | ICD-10-CM | POA: Diagnosis not present

## 2017-03-12 DIAGNOSIS — Z794 Long term (current) use of insulin: Secondary | ICD-10-CM

## 2017-03-12 DIAGNOSIS — E119 Type 2 diabetes mellitus without complications: Secondary | ICD-10-CM

## 2017-03-12 DIAGNOSIS — N3001 Acute cystitis with hematuria: Secondary | ICD-10-CM | POA: Diagnosis not present

## 2017-03-12 LAB — URINALYSIS, COMPLETE
Bilirubin, UA: NEGATIVE
GLUCOSE, UA: NEGATIVE
Ketones, UA: NEGATIVE
NITRITE UA: NEGATIVE
PH UA: 5.5 (ref 5.0–7.5)
Specific Gravity, UA: 1.025 (ref 1.005–1.030)
UUROB: 0.2 mg/dL (ref 0.2–1.0)

## 2017-03-12 LAB — MICROSCOPIC EXAMINATION
Renal Epithel, UA: NONE SEEN /hpf
WBC, UA: >30 /hpf — AB (ref 0–?)

## 2017-03-12 LAB — LIPID PANEL
CHOLESTEROL TOTAL: 153 mg/dL (ref 100–199)
Chol/HDL Ratio: 2.4 ratio (ref 0.0–4.4)
HDL: 63 mg/dL (ref >39)
LDL Calculated: 65 mg/dL (ref 0–99)
TRIGLYCERIDES: 124 mg/dL (ref 0–149)
VLDL Cholesterol Cal: 25 mg/dL (ref 5–40)

## 2017-03-12 LAB — BAYER DCA HB A1C WAIVED: HB A1C (BAYER DCA - WAIVED): 9.1 % — ABNORMAL HIGH (ref <7.0)

## 2017-03-12 MED ORDER — CIPROFLOXACIN HCL 250 MG PO TABS: 250.0000 mg | ORAL_TABLET | Freq: Two times a day (BID) | ORAL | 0 refills | Status: DC

## 2017-03-12 NOTE — Progress Notes (Signed)
   HPI  Patient presents today here to follow-up for diabetes, concern for UTI, and with left flank pain.  Patient has chronic intermittent abdominal pain. She states that it's been much worse over the last week or so. She describes left flank pain going to the left ASIS rating down to the groin and then over the left thigh.  No fever, chills, sweats. Patient with frequent UTIs, recently placed on daily antibiotics, the family believes is trimethoprim, by urology.  Diabetes Good medication compliance, they do not check blood sugars on a daily basis. She takes basal insulin plus mealtime insulin. No hypoglycemia reported.  Note also that the patient has long-term use of naproxen, I recommended today reducing her dose of naproxen and trying to quit. She states she cannot tolerate Tylenol.  PMH: Smoking status noted ROS: Per HPI  Objective: BP (!) 174/69   Pulse 60   Temp 97 F (36.1 C) (Oral)   Ht 5\' 6"  (1.676 m)   Wt 150 lb 6.4 oz (68.2 kg)   BMI 24.28 kg/m  Gen: NAD, alert, cooperative with exam HEENT: NCAT CV: RRR, good S1/S2, no murmur Resp: CTABL, no wheezes, non-labored Abd: Soft, positive bowel sounds, no guarding, tenderness over the ASIS, also tenderness throughout the abdomen without guarding. Ext: No edema, warm Neuro: Alert and oriented, No gross deficits  Assessment and plan:  # Type 2 diabetes Uncontrolled, recommended follow-up with clinical pharmacist, could consider endocrinology given difficulty controlling her diabetes. Labs today  # Abdominal pain Patient with chronic abdominal pain, this could be partially due to UTI, also could be due to chronic constipation linzess is on her MAR and marked as taking but pt states not tolerating.  Recommended reduce NSAIDs, discussed possibility of gabapentin  # UTI Urinalysis consistent with UTI No signs of sepsis. Treat with Cipro, family will call back and let us know which prevented medication she is  on Culture   Orders Placed This Encounter  Procedures  . Microscopic Examination  . Urine culture  . Microalbumin / creatinine urine ratio  . Bayer DCA Hb A1c Waived  . Urinalysis, Complete  . Lipid panel    Meds ordered this encounter  Medications  . ciprofloxacin (CIPRO) 250 MG tablet    Sig: Take 1 tablet (250 mg total) by mouth 2 (two) times daily.    Dispense:  14 tablet    Refill:  0    Laroy Apple, MD [facility] 03/12/2017, 11:44 AM

## 2017-03-12 NOTE — Patient Instructions (Addendum)
Great to see you!  I am treating a UTI with cipro   I would like her to See one of our clinical pharmacists to discuss diabetes.    Urinary Tract Infection, Adult A urinary tract infection (UTI) is an infection of any part of the urinary tract, which includes the kidneys, ureters, bladder, and urethra. These organs make, store, and get rid of urine in the body. UTI can be a bladder infection (cystitis) or kidney infection (pyelonephritis). What are the causes? This infection may be caused by fungi, viruses, or bacteria. Bacteria are the most common cause of UTIs. This condition can also be caused by repeated incomplete emptying of the bladder during urination. What increases the risk? This condition is more likely to develop if:  You ignore your need to urinate or hold urine for long periods of time.  You do not empty your bladder completely during urination.  You wipe back to front after urinating or having a bowel movement, if you are female.  You are uncircumcised, if you are female.  You are constipated.  You have a urinary catheter that stays in place (indwelling).  You have a weak defense (immune) system.  You have a medical condition that affects your bowels, kidneys, or bladder.  You have diabetes.  You take antibiotic medicines frequently or for long periods of time, and the antibiotics no longer work well against certain types of infections (antibiotic resistance).  You take medicines that irritate your urinary tract.  You are exposed to chemicals that irritate your urinary tract.  You are female. What are the signs or symptoms? Symptoms of this condition include:  Fever.  Frequent urination or passing small amounts of urine frequently.  Needing to urinate urgently.  Pain or burning with urination.  Urine that smells bad or unusual.  Cloudy urine.  Pain in the lower abdomen or back.  Trouble urinating.  Blood in the urine.  Vomiting or being less  hungry than normal.  Diarrhea or abdominal pain.  Vaginal discharge, if you are female. How is this diagnosed? This condition is diagnosed with a medical history and physical exam. You will also need to provide a urine sample to test your urine. Other tests may be done, including:  Blood tests.  Sexually transmitted disease (STD) testing. If you have had more than one UTI, a cystoscopy or imaging studies may be done to determine the cause of the infections. How is this treated? Treatment for this condition often includes a combination of two or more of the following:  Antibiotic medicine.  Other medicines to treat less common causes of UTI.  Over-the-counter medicines to treat pain.  Drinking enough water to stay hydrated. Follow these instructions at home:  Take over-the-counter and prescription medicines only as told by your health care provider.  If you were prescribed an antibiotic, take it as told by your health care provider. Do not stop taking the antibiotic even if you start to feel better.  Avoid alcohol, caffeine, tea, and carbonated beverages. They can irritate your bladder.  Drink enough fluid to keep your urine clear or pale yellow.  Keep all follow-up visits as told by your health care provider. This is important.  Make sure to:  Empty your bladder often and completely. Do not hold urine for long periods of time.  Empty your bladder before and after sex.  Wipe from front to back after a bowel movement if you are female. Use each tissue one time when you wipe.  Contact a health care provider if:  You have back pain.  You have a fever.  You feel nauseous or vomit.  Your symptoms do not get better after 3 days.  Your symptoms go away and then return. Get help right away if:  You have severe back pain or lower abdominal pain.  You are vomiting and cannot keep down any medicines or water. This information is not intended to replace advice given to you  by your health care provider. Make sure you discuss any questions you have with your health care provider. Document Released: 09/02/2005 Document Revised: 05/06/2016 Document Reviewed: 10/14/2015 Elsevier Interactive Patient Education  2017 [employer].

## 2017-03-13 LAB — MICROALBUMIN / CREATININE URINE RATIO
Creatinine, Urine: 116.9 mg/dL
Microalb/Creat Ratio: 701.6 mg/g creat — ABNORMAL HIGH (ref 0.0–30.0)
Microalbumin, Urine: 820.2 ug/mL

## 2017-03-15 ENCOUNTER — Other Ambulatory Visit: Payer: Self-pay | Admitting: Family Medicine

## 2017-03-15 ENCOUNTER — Telehealth: Payer: Self-pay | Admitting: Family Medicine

## 2017-03-15 LAB — URINE CULTURE

## 2017-03-15 MED ORDER — NITROFURANTOIN MONOHYD MACRO 100 MG PO CAPS: 100.0000 mg | ORAL_CAPSULE | Freq: Two times a day (BID) | ORAL | 0 refills | Status: DC

## 2017-03-16 ENCOUNTER — Encounter: Payer: Medicare Other | Admitting: Physical Therapy

## 2017-03-19 ENCOUNTER — Encounter: Payer: Medicare Other | Admitting: *Deleted

## 2017-03-22 NOTE — Telephone Encounter (Signed)
Patient aware of lab results on 03/17/17

## 2017-03-23 DIAGNOSIS — B351 Tinea unguium: Secondary | ICD-10-CM | POA: Diagnosis not present

## 2017-03-23 DIAGNOSIS — E1142 Type 2 diabetes mellitus with diabetic polyneuropathy: Secondary | ICD-10-CM | POA: Diagnosis not present

## 2017-03-23 DIAGNOSIS — M79676 Pain in unspecified toe(s): Secondary | ICD-10-CM | POA: Diagnosis not present

## 2017-03-23 DIAGNOSIS — L84 Corns and callosities: Secondary | ICD-10-CM | POA: Diagnosis not present

## 2017-03-24 ENCOUNTER — Encounter: Payer: Self-pay | Admitting: *Deleted

## 2017-03-30 ENCOUNTER — Other Ambulatory Visit: Payer: Self-pay | Admitting: Family

## 2017-03-30 ENCOUNTER — Other Ambulatory Visit: Payer: Self-pay | Admitting: Family Medicine

## 2017-04-06 ENCOUNTER — Ambulatory Visit (INDEPENDENT_AMBULATORY_CARE_PROVIDER_SITE_OTHER): Payer: Medicare Other | Admitting: Pharmacist

## 2017-04-06 VITALS — Ht 66.0 in | Wt 151.0 lb

## 2017-04-06 DIAGNOSIS — Z794 Long term (current) use of insulin: Secondary | ICD-10-CM

## 2017-04-06 DIAGNOSIS — E119 Type 2 diabetes mellitus without complications: Secondary | ICD-10-CM | POA: Diagnosis not present

## 2017-04-06 NOTE — Progress Notes (Signed)
[name], female   DOB: November 18, 1931, 83 y.o.   MRN: [id number]   Subjective:    [name] is a 83 y.o. female who presents for education and evaluation of  Type 2 diabetes mellitus requiring insulin therapy.  Current symptoms/problems include hyperglycemia and have been worsening. Symptoms have been present for 3 months.  Current diabetic medications include metformin 500mg  XR takes 1 tablet qd (though directions state bid dosing), levemir 28 units qam and humalog 10 units of BG is over 250 (may given additional 5 units if still elevated after 2 hours).  Patient's daughter reports that she does not have have give Humalog often - about 2 times per week  Current monitoring regimen: home blood tests - 1 to 2 times daily - mostly at night Home blood sugar records: did not bring ing BG records today Any episodes of hypoglycemia? yes - occurs about 2 to 3 times per month  Known diabetic complications: nephropathy and cardiovascular disease Cardiovascular risk factors: advanced age (older than 32 for men, 80 for women), diabetes mellitus, dyslipidemia, hypertension, microalbuminuria and sedentary lifestyle Eye exam current (within one year): yes Weight trend: stable Prior visit with CDE: no Current diet: in general, an "unhealthy" diet Current exercise: none  Is She on ACE inhibitor or angiotensin II receptor blocker?  Yes  lisinopril (Prinivil)    Objective:    Ht 5\' 6"  (1.676 m)   Wt 151 lb (68.5 kg)   BMI 24.37 kg/m    Last A1c = 9.1% (03/12/2017)  Lab Review Glucose (mg/dL)  Date Value  12/31/2016 265 (H)   Glucose, Bld (mg/dL)  Date Value  01/22/2017 123 (H)  12/27/2016 201 (H)  11/22/2016 395 (H)  a Creatinine, Ser (mg/dL)  Date Value  01/22/2017 1.02 (H)  12/31/2016 1.09 (H)  12/27/2016 1.09 (H)    Assessment:    Diabetes Mellitus type II, under inadequate control.    Plan:    1.  Rx changes: increase metformin to 500mg  bid  2.   Continuous Glucose monitor was placed today to try to get better understanding of BG trends.  Patient educated about proper care of CGM and site.  CGM was placed on back of upper left arm.  Patient will wear at least 5 days and up to 14 days.  She is to keep BG, diet and insulin administration records. She can engage in regular activities with special care when showering, changing clothes and swimming (only up to 3 feet and for 30 minutes at a time) 3. CHO counting diet discussed.  Reviewed CHO amount in various foods and how to read nutrition labels.  Discussed recommended serving sizes.  4.  Recommend check BG 2-3 times a day 5. Follow up: 2 weeks  to remove CGM and review report.

## 2017-04-06 NOTE — Patient Instructions (Signed)
Continuous Glucose monitor (CGM) was placed today to try to get better understanding of blood glucose trends.  Patient educated about proper care of CGM and site.  CGM was placed on back of upper left arm.  You will wear at least 5 days and up to 14 days.  Keep blood glucose, diet and insulin administration records. You can engage in regular activities with special care when showering, changing clothes and swimming (only up to 3 feet and for 30 minutes at a time)  Increase metformin 500mg  - take 1 tablet each morning and 1 tablet each evening.

## 2017-04-17 ENCOUNTER — Ambulatory Visit (INDEPENDENT_AMBULATORY_CARE_PROVIDER_SITE_OTHER): Payer: Medicare Other | Admitting: Nurse Practitioner

## 2017-04-17 ENCOUNTER — Encounter: Payer: Self-pay | Admitting: Nurse Practitioner

## 2017-04-17 VITALS — BP 167/73 | HR 56 | Temp 97.7°F | Ht 66.0 in | Wt 148.8 lb

## 2017-04-17 DIAGNOSIS — M25552 Pain in left hip: Secondary | ICD-10-CM | POA: Diagnosis not present

## 2017-04-17 MED ORDER — MELOXICAM 15 MG PO TABS: 15.0000 mg | ORAL_TABLET | Freq: Every day | ORAL | 0 refills | Status: DC

## 2017-04-17 NOTE — Progress Notes (Signed)
   Subjective:    [name], female    DOB: Apr 05, 1931, 83 y.o.   MRN: [id number]  HPI Patient comes in c/o pain in left hip and radiates upward into flank- describes pain as stabbing pain. SHe uses a walker to walk, but hunches over when walking. Rates 6/10 right now. Resting helps pain.wears back brace sometimes that helps her walk standing up straighter and that helps with pain. Aleve helps some with pain.     Review of Systems  Constitutional: Negative.   Respiratory: Negative.   Cardiovascular: Negative.   Genitourinary: Negative.   Musculoskeletal: Positive for arthralgias.  Neurological: Negative.   Psychiatric/Behavioral: Negative.   All other systems reviewed and are negative.      Objective:   Physical Exam  Constitutional: She is oriented to person, place, and time. She appears well-developed and well-nourished.  Cardiovascular: Normal rate and regular rhythm.   Pulmonary/Chest: Effort normal.  Musculoskeletal:  Pain on palaption of left hip. Decreased ROM of left Hip due to pain on abduction and extension.  Neurological: She is alert and oriented to person, place, and time.  Skin: Skin is warm.  Psychiatric: She has a normal mood and affect. Her behavior is normal. Judgment and thought content normal.   BP (!) 167/73   Pulse (!) 56   Temp 97.7 F (36.5 C) (Oral)   Ht 5\' 6"  (1.676 m)   Wt 148 lb 12.8 oz (67.5 kg)   BMI 24.02 kg/m         Assessment & Plan:   1. Left hip pain    Meds ordered this encounter  Medications  . meloxicam (MOBIC) 15 MG tablet    Sig: Take 1 tablet (15 mg total) by mouth daily.    Dispense:  30 tablet    Refill:  0    Order Specific Question:   Supervising Provider    Answer:   Ricard Dillon [[id number]]   Chart says allergic to advil but take aleve on regular basis without problems Rest hip Need to follow up with ortho if hip pain continues RTO prn  Mary-Margaret Hassell Done, FNP

## 2017-04-17 NOTE — Patient Instructions (Signed)
Hip Bursitis Hip bursitis is swelling of a fluid-filled sac (bursa) in your hip. This swelling (inflammation) can be painful. This condition may come and go over time. Follow these instructions at home: Medicines   Take over-the-counter and prescription medicines only as told by your doctor.  Do not drive or use heavy machinery while taking prescription pain medicine, or as told by your doctor.  If you were prescribed an antibiotic medicine, take it as told by your doctor. Do not stop taking the antibiotic even if you start to feel better. Activity   Return to your normal activities as told by your doctor. Ask your doctor what activities are safe for you.  Rest and protect your hip until you feel better. General instructions   Wear wraps that put pressure on your hip (compression wraps) only as told by your doctor.  Raise (elevate) your hip above the level of your heart as much as you can. To do this, try putting a pillow under your hips while you lie down. Stop if this causes pain.  Do not use your hip to support your body weight until your doctor says that you can.  Use crutches as told by your doctor.  Gently rub and stretch your injured area as often as is comfortable.  Keep all follow-up visits as told by your doctor. This is important. How is this prevented?  Exercise regularly, as told by your doctor.  Warm up and stretch before being active.  Cool down and stretch after being active.  Avoid activities that bother your hip or cause pain.  Avoid sitting down for long periods at a time. Contact a doctor if:  You have a fever.  You get new symptoms.  You have trouble walking.  You have trouble doing everyday activities.  You have pain that gets worse.  You have pain that does not get better with medicine.  You get red skin on your hip area.  You get a feeling of warmth in your hip area. Get help right away if:  You cannot move your hip.  You have very  bad pain. This information is not intended to replace advice given to you by your health care provider. Make sure you discuss any questions you have with your health care provider. Document Released: 12/26/2010 Document Revised: 04/30/2016 Document Reviewed: 06/25/2015 Elsevier Interactive Patient Education  2017 [employer].

## 2017-04-19 ENCOUNTER — Other Ambulatory Visit: Payer: Self-pay | Admitting: Family Medicine

## 2017-04-20 ENCOUNTER — Encounter: Payer: Self-pay | Admitting: Pharmacist

## 2017-04-20 ENCOUNTER — Ambulatory Visit (INDEPENDENT_AMBULATORY_CARE_PROVIDER_SITE_OTHER): Payer: Medicare Other | Admitting: Pharmacist

## 2017-04-20 ENCOUNTER — Ambulatory Visit (INDEPENDENT_AMBULATORY_CARE_PROVIDER_SITE_OTHER): Payer: Medicare Other

## 2017-04-20 VITALS — BP 124/58 | HR 60 | Ht 66.0 in | Wt 148.5 lb

## 2017-04-20 DIAGNOSIS — M8588 Other specified disorders of bone density and structure, other site: Secondary | ICD-10-CM

## 2017-04-20 DIAGNOSIS — E119 Type 2 diabetes mellitus without complications: Secondary | ICD-10-CM

## 2017-04-20 DIAGNOSIS — Z0000 Encounter for general adult medical examination without abnormal findings: Secondary | ICD-10-CM | POA: Diagnosis not present

## 2017-04-20 DIAGNOSIS — Z794 Long term (current) use of insulin: Secondary | ICD-10-CM

## 2017-04-20 DIAGNOSIS — M8580 Other specified disorders of bone density and structure, unspecified site: Secondary | ICD-10-CM

## 2017-04-20 MED ORDER — INSULIN DETEMIR 100 UNIT/ML FLEXPEN: 22.0000 [IU] | PEN_INJECTOR | Freq: Every day | SUBCUTANEOUS | 2 refills | Status: DC

## 2017-04-20 NOTE — Progress Notes (Addendum)
[name], female   DOB: 05/06/1931, 82 y.o.   MRN: [id number]    Subjective:   [name] is a 83 y.o. female who presents for a subsequent Medicare Annual Wellness Visit and to have CGM removed.  CGM was placed 2 weeks ago.  Patient's daughter is here with her today  Social History: Widowed for 30 years.  Four children - 3 daughters and 1 son (deceased).  Patient lives with one of her daughters.  She has great support from her family.   She finished 4th grade and then when to live with her brother.  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    Review of Systems  Review of Systems  Constitutional: Negative.   HENT: Negative.   Eyes: Negative.   Respiratory: Negative.   Cardiovascular: Positive for leg swelling (started about 2-3 days aog - patient has not taken any prn furosimide yet).  Gastrointestinal: Positive for heartburn.       Difficulty swallowing follow CVA years ago   Musculoskeletal: Positive for joint pain.  Skin: Negative.   Neurological: Negative.   Endo/Heme/Allergies: Negative.   Psychiatric/Behavioral: Negative.      Current Medications (verified) Outpatient Encounter Prescriptions as of 04/20/2017  Medication Sig  . amLODipine (NORVASC) 10 MG tablet Take 1 tablet (10 mg total) by mouth daily.  . Artificial Tear Ointment (DRY EYES OP) Apply 1 drop to eye daily as needed (dry/red eyes).   Marland Kitchen atorvastatin (LIPITOR) 20 MG tablet TAKE (1) TABLET DAILY AT BEDTIME.  . clopidogrel (PLAVIX) 75 MG tablet Take 1 tablet (75 mg total) by mouth daily.  Marland Kitchen EASY TOUCH INSULIN SYRINGE 31G X 5/16" 1 ML MISC USE AS DIRECTED.  Marland Kitchen EASY TOUCH PEN NEEDLES 31G X 8 MM MISC AS DIRECTED  . esomeprazole (NEXIUM) 40 MG capsule Take 1 capsule (40 mg total) by mouth daily.  . ferrous sulfate 325 (65 FE) MG tablet TAKE (1) TABLET THREE TIMES DAILY AFTER MEALS. (Patient taking differently: 1 tablet qhs)  . furosemide (LASIX) 20 MG tablet Take  1 tablet (20 mg total) by mouth daily as needed for fluid or edema.  . Insulin Detemir (LEVEMIR FLEXTOUCH) 100 UNIT/ML Pen Inject 22-28 Units into the skin daily.  . insulin lispro (HUMALOG KWIKPEN) 100 UNIT/ML KiwkPen Inject 4-10 Units into the skin daily as needed (high blood sugar).  . Insulin Pen Needle 31G X 5 MM MISC Use with  Levemir injection  . isosorbide mononitrate (IMDUR) 30 MG 24 hr tablet TAKE (1) TABLET BY MOUTH ONCE DAILY.  Marland Kitchen lisinopril (PRINIVIL,ZESTRIL) 20 MG tablet TAKE (1) TABLET BY MOUTH ONCE DAILY.  Marland Kitchen LORazepam (ATIVAN) 0.5 MG tablet TAKE (1) TABLET TWICE DAILY as needed for anxiety  . meclizine (ANTIVERT) 25 MG tablet TAKE 1 TABLET TWICE DAILY AS NEEDED FOR DIZZINESS.  . meloxicam (MOBIC) 15 MG tablet Take 1 tablet (15 mg total) by mouth daily.  . metFORMIN (GLUCOPHAGE-XR) 500 MG 24 hr tablet TAKE 1 TABLET WITH BREAKFAST AND 1 TABLET WITH SUPPER. (Patient taking differently: Take 500 mg by mouth daily with breakfast. TAKE 1 TABLET WITH BREAKFAST AND 1 TABLET WITH SUPPER.)  . metoprolol (LOPRESSOR) 50 MG tablet TAKE (1) TABLET TWICE DAILY.  . Multiple Vitamins-Iron (DAILY VITAMINS/IRON/BETA CAROT PO) Take 1 tablet by mouth at bedtime.  . naproxen sodium (ANAPROX) 220 MG tablet Take 220 mg by mouth daily as needed (pain).   . nitroGLYCERIN (NITROSTAT) 0.4 MG SL  tablet PLACE ONE (1) TABLET UNDER TONGUE EVERY 5 MINUTES UP TO (3) DOSES AS NEEDED FOR CHEST PAIN.  Marland Kitchen PARoxetine (PAXIL) 20 MG tablet Take 1 tablet (20 mg total) by mouth daily.  . traMADol (ULTRAM) 50 MG tablet Take 1 tablet (50 mg total) by mouth every 6 (six) hours as needed.  . trimethoprim (TRIMPEX) 100 MG tablet Take 100 mg by mouth daily.  . [DISCONTINUED] LEVEMIR FLEXTOUCH 100 UNIT/ML Pen INJECT 28 UNITS EVERY MORNING   No facility-administered encounter medications on file as of 04/20/2017.     Allergies (verified) Advil [ibuprofen]; Heparin; and Propoxyphene n-acetaminophen   History: Past Medical  History:  Diagnosis Date  . Anemia   . Anxiety   . Arthritis    back, knees, and hips  . Asthma    with allergies  . Balance problems   . CAD (coronary artery disease)   . Carotid stenosis   . CHF (congestive heart failure) ([place])    EF preserved Echo 2012  . Chronic kidney infection   . 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 Dementia    "a little"  . Depression   . Diabetes mellitus    type 2  . Furuncle of back, except buttock   . GERD (gastroesophageal reflux disease)   . H/O seasonal allergies   . Hearing loss   . Hypertension   . Iron deficiency anemia 05/27/2011  . Myocardial infarction (DISH)   . Pneumonia    hx of  . Renal insufficiency   . Shortness of breath dyspnea    with activity  . Speech problem    from stroke  . Swallowing difficulty   . Vertigo    "when sugar gets low"  . Vision loss    left eye-"half blind"   Past Surgical History:  Procedure Laterality Date  . APPENDECTOMY     with hysterectomy  . CATARACT EXTRACTION Bilateral 5 years ago  . CHOLECYSTECTOMY    . CORONARY ANGIOPLASTY     prior to 2006 5 stents  . CORONARY ARTERY BYPASS GRAFT  2006  . I & D of Furuncle  April 2013  . KNEE SURGERY Right   . TOTAL ABDOMINAL HYSTERECTOMY  ~83 years old   complete, with tumor removal  . TOTAL KNEE ARTHROPLASTY Right 08/13/2015   Procedure: RIGHT  TOTAL KNEE ARTHROPLASTY;  Surgeon: Paralee Cancel, MD;  Location: [facility];  Service: Orthopedics;  Laterality: Right;   Family History  Problem Relation Age of Onset  . Cancer Brother        porstate  . Early death Sister   . Heart disease Brother   . Heart disease Brother   . Heart attack Brother   . Heart disease Brother   . Heart attack Brother   . Diabetes Sister   . Heart attack Sister   . Diabetes Sister   . Osteoporosis Sister   . Hypertension Sister    Social History   Occupational History  . Not on  file.   Social History Main Topics  . Smoking status: Never Smoker  . Smokeless tobacco: Never Used  . Alcohol use No  . Drug use: No  . Sexual activity: Not Currently    Do you feel safe at home?  Yes Are there smokers in your home (other than you)? No  Dietary issues and exercise activities: Current Exercise Habits: The patient does not  participate in regular exercise at present but recently has PT for low back pain - 01/2017 to 03/2017  Current Dietary habits:  Recently changed form regular to diet soda or water.  Has decreased intake of sweets over the last 2 weeks.  Breakfast around 9am - cereal, oatmeal, eggs;  Small lunch around noon; Supper / Dinner 6 to 7:49m - usually meat, vegetable, starch    Objective:    Today's Vitals   04/20/17 1542  BP: (!) 124/58  Pulse: 60  Weight: 148 lb 8 oz (67.4 kg)  Height: 5\' 6"  (1.676 m)   Body mass index is 23.97 kg/m.   Results from CGM report (04/07/17 thru 04/20/17) Average BG = 175 Est A1c = 7.7% 22% of time in target (80 to 140) 15% of times below target (less than 80)  63% of time above target (over 180) 9 episodes of hypoglycemia - all occurred between midnight and 8am.   Hyperglycemic episodes occurring daily around noon and after evening meal.   DEXA:  T-Score for Total Left Femur:  04/20/2017 = -1.9 08/01/2014 = -0.8 09/23/2011 = -0.6 07/18/2008 = -0.3  T-Score for Left Forearm: 04/20/2017 = -2.4  Frax Estimate:  Major osteoporotic fracture = 21.4% Hip Fracture = 6.0%   Activities of Daily Living In your present state of health, do you have any difficulty performing the following activities: 04/20/2017  Hearing? N  Vision? N  Difficulty concentrating or making decisions? N  Walking or climbing stairs? Y  Dressing or bathing? Y  Doing errands, shopping? Y  Preparing Food and eating ? Y  Using the Toilet? N  In the past six months, have you accidently leaked urine? Y  Do you have problems with loss of  bowel control? N  Managing your Medications? Y  Managing your Finances? Y  Housekeeping or managing your Housekeeping? Y  Some recent data might be hidden     Cardiac Risk Factors include: advanced age (>75men, >64 women);diabetes mellitus;hypertension;microalbuminuria;sedentary lifestyle  Depression Screen PHQ 2/9 Scores 04/20/2017 04/17/2017 03/12/2017 01/29/2017  PHQ - 2 Score 2 2 4  0  PHQ- 9 Score 6 7 11  -  Exception Documentation - - - -  Not completed - - - -     Fall Risk Fall Risk  04/20/2017 04/17/2017 03/12/2017 01/29/2017 01/23/2017  Falls in the past year? Yes Yes Yes Yes Yes  Number falls in past yr: 2 or more 2 or more 2 or more 2 or more 2 or more  Injury with Fall? Yes Yes Yes Yes Yes  Risk Factor Category  High Fall Risk High Fall Risk - - -  Risk for fall due to : Impaired balance/gait;Impaired mobility;Impaired vision;History of fall(s) History of fall(s);Impaired balance/gait;Impaired mobility;Impaired vision - - -  Follow up Falls prevention discussed Falls evaluation completed - - -    Cognitive Function: MMSE - Mini Mental State Exam 04/20/2017 04/03/2016  Not completed: Unable to complete -  Orientation to time 4 5  Orientation to Place 4 5  Registration 3 3  Attention/ Calculation 0 5  Recall 2 3  Language- name 2 objects 2 2  Language- repeat 1 1  Language- follow 3 step command 0 (No Data)  Language- follow 3 step command-comments - pt is unable to read  Language- read & follow direction 0 -  Write a sentence 0 -  Copy design 0 -  Total score 16 -    Immunizations and Health Maintenance  There is no immunization  history on file for this patient. Health Maintenance Due  Topic Date Due  . TETANUS/TDAP  10/20/1952  . PNA vac Low Risk Adult (1 of 2 - PCV13) 10/20/1998  . DEXA SCAN  08/01/2016  . MAMMOGRAM  12/09/2016    Patient Care Team: Timmothy Euler, MD as PCP - General (Family Medicine) Minus Breeding, MD as Consulting Physician  (Cardiology) Steffanie Rainwater, DPM as Consulting Physician (Podiatry) Williams Che, MD (Inactive) as Consulting Physician (Ophthalmology)  Indicate any recent Medical Services you may have received from other than [facility] providers in the past year (date may be approximate).    Assessment:    Annual Wellness Visit  Osteopenia with high fracture risk Type 2 DM, uncontrolled, long term insulin - variable BG with hypoglycemia and hyperglycemia events   Screening Tests Health Maintenance  Topic Date Due  . TETANUS/TDAP  10/20/1952  . PNA vac Low Risk Adult (1 of 2 - PCV13) 10/20/1998  . DEXA SCAN  08/01/2016  . MAMMOGRAM  12/09/2016  . OPHTHALMOLOGY EXAM  05/19/2017  . INFLUENZA VACCINE  07/07/2017  . FOOT EXAM  09/04/2017  . HEMOGLOBIN A1C  09/11/2017        Plan:   During the course of the visit [name] was educated and counseled about the following appropriate screening and preventive services:   Vaccines to include Pneumoccal, Influenza, Td and Shingles - declined all vaccines  Colorectal cancer screening  Cardiovascular disease screening  Decrease Levemir to 22 unit qam.  Add Humalog 4 units with evening meal   Continue to check BG up to qid - prior to meals and at bedtime  Bone Denisty / Osteoporosis Screening - done today.    Recommended injectable bisphosphonate / Reclast due to difficulty swallowing post CVA.  Patient's daughter wants to discuss with PCP.  Information provided.    Continue to take MVI daily - increase calcium rich foods - add low fat milk serving once a day. Goal is 1200mg  calcium daily  Mammogram - has appt July 04, 2017  Glaucoma screening / Diabetic Eye Exam - UTD - requesting last visit from Dr Iona Hansen office  Nutrition counseling -continue to limit sweets and other high CHO foods.  Increase lean proteins and non starchy vegetables.  Advanced Directives  Physical Activity - gave handout of Chair exercises  Fall Prevention  Take  furosemide for next 2-3 days.  If edema does not improve, gets worse or if patient has SOB, she is to call office.   Patient Instructions (the written plan) were given to the patient.   Cherre Robins, PharmD   04/20/2017

## 2017-04-20 NOTE — Addendum Note (Signed)
Addended by: Cherre Robins B on: 04/20/2017 10:40 PM   Modules accepted: Level of Service

## 2017-04-20 NOTE — Patient Instructions (Addendum)
Ms. [name] , Thank you for taking time to come for your Medicare Wellness Visit. I appreciate your ongoing commitment to your health goals. Please review the following plan we discussed and let me know if I can assist you in the future.   These are the goals we discussed:  Decrease Levemir to 22 units once a day  Start injecting Humalog 4 units with supper.  You can still give 10 units as needed for blood glucose over 250 but do not given within 2 hours of previous dose.   Continue to use walker, toilet seat and shower seat to prevent falls.   Continue to limit sweets and sugar intake.  Increase lean proteins - fish, chicken without skin, low fat beef, nuts and beans.  Increase whole grains and fruits (but keep serving sizes to 1/2 cup) Increase non starchy vegetables -  Green beans, tomatoes, cucumber, celery, peppers, greens, spinach, broccoli, cauliflower, squash, zucchini, eggplant  This is a list of the screening recommended for you and due dates:  Health Maintenance  Topic Date Due  . Tetanus Vaccine  10/20/1952  . Pneumonia vaccines (1 of 2 - PCV13) 10/20/1998  . DEXA scan (bone density measurement)  08/01/2016  . Mammogram  12/09/2016  . Eye exam for diabetics  05/19/2017  . Flu Shot  07/07/2017  . Complete foot exam   09/04/2017  . Hemoglobin A1C  09/11/2017    Fall Prevention in the Home Falls can cause injuries and can affect people from all age groups. There are many simple things that you can do to make your home safe and to help prevent falls. What can I do on the outside of my home?  Regularly repair the edges of walkways and driveways and fix any cracks.  Remove high doorway thresholds.  Trim any shrubbery on the main path into your home.  Use bright outdoor lighting.  Clear walkways of debris and clutter, including tools and rocks.  Regularly check that handrails are securely fastened and in good repair. Both sides of any steps should have  handrails.  Install guardrails along the edges of any raised decks or porches.  Have leaves, snow, and ice cleared regularly.  Use sand or salt on walkways during winter months.  In the garage, clean up any spills right away, including grease or oil spills. What can I do in the bathroom?  Use night lights.  Install grab bars by the toilet and in the tub and shower. Do not use towel bars as grab bars.  Use non-skid mats or decals on the floor of the tub or shower.  If you need to sit down while you are in the shower, use a plastic, non-slip stool.  Keep the floor dry. Immediately clean up any water that spills on the floor.  Remove soap buildup in the tub or shower on a regular basis.  Attach bath mats securely with double-sided non-slip rug tape.  Remove throw rugs and other tripping hazards from the floor. What can I do in the bedroom?  Use night lights.  Make sure that a bedside light is easy to reach.  Do not use oversized bedding that drapes onto the floor.  Have a firm chair that has side arms to use for getting dressed.  Remove throw rugs and other tripping hazards from the floor. What can I do in the kitchen?  Clean up any spills right away.  Avoid walking on wet floors.  Place frequently used items in easy-to-reach places.  If you need to reach for something above you, use a sturdy step stool that has a grab bar.  Keep electrical cables out of the way.  Do not use floor polish or wax that makes floors slippery. If you have to use wax, make sure that it is non-skid floor wax.  Remove throw rugs and other tripping hazards from the floor. What can I do in the stairways?  Do not leave any items on the stairs.  Make sure that there are handrails on both sides of the stairs. Fix handrails that are broken or loose. Make sure that handrails are as long as the stairways.  Check any carpeting to make sure that it is firmly attached to the stairs. Fix any carpet  that is loose or worn.  Avoid having throw rugs at the top or bottom of stairways, or secure the rugs with carpet tape to prevent them from moving.  Make sure that you have a light switch at the top of the stairs and the bottom of the stairs. If you do not have them, have them installed. What are some other fall prevention tips?  Wear closed-toe shoes that fit well and support your feet. Wear shoes that have rubber soles or low heels.  When you use a stepladder, make sure that it is completely opened and that the sides are firmly locked. Have someone hold the ladder while you are using it. Do not climb a closed stepladder.  Add color or contrast paint or tape to grab bars and handrails in your home. Place contrasting color strips on the first and last steps.  Use mobility aids as needed, such as canes, walkers, scooters, and crutches.  Turn on lights if it is dark. Replace any light bulbs that burn out.  Set up furniture so that there are clear paths. Keep the furniture in the same spot.  Fix any uneven floor surfaces.  Choose a carpet design that does not hide the edge of steps of a stairway.  Be aware of any and all pets.  Review your medicines with your healthcare provider. Some medicines can cause dizziness or changes in blood pressure, which increase your risk of falling. Talk with your health care provider about other ways that you can decrease your risk of falls. This may include working with a physical therapist or trainer to improve your strength, balance, and endurance. This information is not intended to replace advice given to you by your health care provider. Make sure you discuss any questions you have with your health care provider. Document Released: 11/13/2002 Document Revised: 04/21/2016 Document Reviewed: 12/28/2014 Elsevier Interactive Patient Education  2017 [employer].

## 2017-04-27 ENCOUNTER — Encounter: Payer: Self-pay | Admitting: Pharmacist

## 2017-04-27 ENCOUNTER — Ambulatory Visit (INDEPENDENT_AMBULATORY_CARE_PROVIDER_SITE_OTHER): Payer: Medicare Other | Admitting: Pharmacist

## 2017-04-27 VITALS — Ht 66.0 in | Wt 147.0 lb

## 2017-04-27 DIAGNOSIS — M8580 Other specified disorders of bone density and structure, unspecified site: Secondary | ICD-10-CM | POA: Diagnosis not present

## 2017-04-27 MED ORDER — ALENDRONATE SODIUM 70 MG PO TBEF: EFFERVESCENT_TABLET | ORAL | 11 refills | Status: DC

## 2017-04-27 NOTE — Progress Notes (Signed)
Patient ID: LAKEENA DOWNIE, female   DOB: 13-Mar-1931, 83 y.o.   MRN: [id number]      HPI: Mrs. Gerling has history of osteopenia.  She is not currently taking medication for bones except MVI with calcium and vitamin D.   At last visit we discussed possibly starting Reclast but patient and her family would like to try something else first.   She has had 3 fractures as an adult - ankle at age 38yo, arm at 83yo and tailbone at 83yo.  Back Pain?  Yes       Kyphosis?  Yes                                                            PMH: Hysterectomy?  Yes Oophorectomy?  Yes HRT? No Steroid Use?  No Thyroid med?  No History of cancer?  No History of digestive disorders (ie Crohn's)?  No Due to past CVA patient has difficulty swallowing and speaking Current or previous eating disorders?  No Last Vitamin D Result:  35 (05/29/2013) Last GFR Result:  49 (01/22/2017)   FH/SH: Family history of osteoporosis?  No Parent with history of hip fracture?  No Family history of breast cancer?  No Exercise?  No Smoking?  No Alcohol?  No    Calcium Assessment Calcium Intake  # of servings/day  Calcium mg  Milk (8 oz) 1  x  300  = 300mg   Yogurt (4 oz) 0 x  200 = 0  Cheese (1 oz) 1-2 x  200 = 200-400  Other Calcium sources   250mg   Ca supplement MVI - 500mg  of calcium per tablet = 500mg    Estimated calcium intake per day 1250 to 1450mg     DEXA Results Date of Test Left Forearm T-Score for Total Left Hip  04/20/2017 -2.4 -1.9  08/01/2014 -- -0.8  09/23/2011 -- -0.6  07/18/2008 -- -0.3   FRAX 10 year estimate: Total FX risk:  21.4%  (consider medication if >/= 20%) Hip FX risk:  6.0%  (consider medication if >/= 3%)  Assessment: Osteopenia with significant risk of fracture per FRAX estimate  Recommendations: 1.  Start  Binosto (alendronate effevescent tablets) -once weekly. Patient to dissolve 1 tablet in at least 4 ounces of water.  Allow to dissolve completley and then drink mixture.   Gave #2 samples and Rx to take to pharmacy if tolerated. 2.  continue calcium 1200mg  daily through supplementation or diet.  3.  recommend weight bearing exercise as able  4.  Counseled and educated about fall risk and prevention.  Recheck DEXA:  2 years  Time spent counseling patient:  30 minutes

## 2017-05-03 ENCOUNTER — Other Ambulatory Visit: Payer: Self-pay | Admitting: Family Medicine

## 2017-05-04 ENCOUNTER — Other Ambulatory Visit: Payer: Self-pay | Admitting: *Deleted

## 2017-05-04 DIAGNOSIS — F411 Generalized anxiety disorder: Secondary | ICD-10-CM

## 2017-05-04 MED ORDER — LORAZEPAM 0.5 MG PO TABS: ORAL_TABLET | ORAL | 1 refills | Status: DC

## 2017-05-04 NOTE — Telephone Encounter (Signed)
Phoned in.

## 2017-05-05 ENCOUNTER — Other Ambulatory Visit: Payer: Self-pay | Admitting: *Deleted

## 2017-05-29 ENCOUNTER — Other Ambulatory Visit: Payer: Self-pay | Admitting: Family Medicine

## 2017-05-29 DIAGNOSIS — Z794 Long term (current) use of insulin: Secondary | ICD-10-CM

## 2017-05-29 DIAGNOSIS — F411 Generalized anxiety disorder: Secondary | ICD-10-CM

## 2017-05-29 DIAGNOSIS — E119 Type 2 diabetes mellitus without complications: Secondary | ICD-10-CM

## 2017-05-31 ENCOUNTER — Other Ambulatory Visit: Payer: Self-pay | Admitting: Family Medicine

## 2017-05-31 DIAGNOSIS — I10 Essential (primary) hypertension: Secondary | ICD-10-CM

## 2017-06-01 ENCOUNTER — Other Ambulatory Visit: Payer: Self-pay | Admitting: Family Medicine

## 2017-06-01 DIAGNOSIS — L84 Corns and callosities: Secondary | ICD-10-CM | POA: Diagnosis not present

## 2017-06-01 DIAGNOSIS — M79676 Pain in unspecified toe(s): Secondary | ICD-10-CM | POA: Diagnosis not present

## 2017-06-01 DIAGNOSIS — E1142 Type 2 diabetes mellitus with diabetic polyneuropathy: Secondary | ICD-10-CM | POA: Diagnosis not present

## 2017-06-01 DIAGNOSIS — B351 Tinea unguium: Secondary | ICD-10-CM | POA: Diagnosis not present

## 2017-06-04 ENCOUNTER — Ambulatory Visit: Payer: Medicare Other | Admitting: Urology

## 2017-06-04 ENCOUNTER — Ambulatory Visit (INDEPENDENT_AMBULATORY_CARE_PROVIDER_SITE_OTHER): Payer: Medicare Other | Admitting: Urology

## 2017-06-04 DIAGNOSIS — N3001 Acute cystitis with hematuria: Secondary | ICD-10-CM | POA: Diagnosis not present

## 2017-06-07 ENCOUNTER — Ambulatory Visit: Payer: Medicare Other

## 2017-06-07 ENCOUNTER — Other Ambulatory Visit: Payer: Self-pay | Admitting: Family Medicine

## 2017-06-07 ENCOUNTER — Encounter (INDEPENDENT_AMBULATORY_CARE_PROVIDER_SITE_OTHER): Payer: Medicare Other | Admitting: *Deleted

## 2017-06-07 DIAGNOSIS — E119 Type 2 diabetes mellitus without complications: Secondary | ICD-10-CM

## 2017-06-07 DIAGNOSIS — Z0000 Encounter for general adult medical examination without abnormal findings: Secondary | ICD-10-CM

## 2017-06-07 DIAGNOSIS — Z794 Long term (current) use of insulin: Principal | ICD-10-CM

## 2017-06-07 NOTE — Progress Notes (Signed)
Voided encounter-Patient had an AWV in 04/2017.

## 2017-06-16 ENCOUNTER — Ambulatory Visit (INDEPENDENT_AMBULATORY_CARE_PROVIDER_SITE_OTHER): Payer: Medicare Other | Admitting: Family Medicine

## 2017-06-16 ENCOUNTER — Encounter: Payer: Self-pay | Admitting: Family Medicine

## 2017-06-16 VITALS — BP 144/64 | HR 53 | Temp 97.3°F | Ht 64.0 in | Wt 149.4 lb

## 2017-06-16 DIAGNOSIS — E119 Type 2 diabetes mellitus without complications: Secondary | ICD-10-CM

## 2017-06-16 DIAGNOSIS — G8929 Other chronic pain: Secondary | ICD-10-CM

## 2017-06-16 DIAGNOSIS — Z794 Long term (current) use of insulin: Secondary | ICD-10-CM | POA: Diagnosis not present

## 2017-06-16 DIAGNOSIS — N390 Urinary tract infection, site not specified: Secondary | ICD-10-CM

## 2017-06-16 DIAGNOSIS — Z87440 Personal history of urinary (tract) infections: Secondary | ICD-10-CM | POA: Diagnosis not present

## 2017-06-16 DIAGNOSIS — M545 Low back pain: Secondary | ICD-10-CM

## 2017-06-16 DIAGNOSIS — F411 Generalized anxiety disorder: Secondary | ICD-10-CM | POA: Diagnosis not present

## 2017-06-16 LAB — MICROSCOPIC EXAMINATION

## 2017-06-16 LAB — URINALYSIS, COMPLETE
BILIRUBIN UA: NEGATIVE
GLUCOSE, UA: NEGATIVE
Ketones, UA: NEGATIVE
Nitrite, UA: NEGATIVE
SPEC GRAV UA: 1.015 (ref 1.005–1.030)
UUROB: 0.2 mg/dL (ref 0.2–1.0)
pH, UA: 5.5 (ref 5.0–7.5)

## 2017-06-16 LAB — BAYER DCA HB A1C WAIVED: HB A1C: 8.3 % — AB (ref <7.0)

## 2017-06-16 MED ORDER — LORAZEPAM 0.5 MG PO TABS: ORAL_TABLET | ORAL | 2 refills | Status: DC

## 2017-06-16 MED ORDER — TRAMADOL HCL 50 MG PO TABS: 50.0000 mg | ORAL_TABLET | Freq: Four times a day (QID) | ORAL | 2 refills | Status: DC | PRN

## 2017-06-16 NOTE — Patient Instructions (Signed)
Great to see you!  Stop naproxen and meloxicam  Take tramadol for pain only as needed

## 2017-06-16 NOTE — Progress Notes (Signed)
   HPI  Patient presents today here for follow-up chronic medical conditions.  Patient complains of lower abdominal pain, this is unchanged from previous. They believe that it's due to arthritis in the low back. She takes meloxicam, naproxen, and tramadol for this some of these help. At times she does not have pain. She's concerned about a UTI. Patient has been started on another antibiotic by urology since our last visit.  Anxiety Taking Ativan on most days, needs refill. Feels this is reasonably good control, however does request twice daily dosing.  Type 2 diabetes Good medication compliance, no hypoglycemia. Watching diet moderately  PMH: Smoking status noted ROS: Per HPI  Objective: BP (!) 144/64   Pulse (!) 53   Temp (!) 97.3 F (36.3 C) (Oral)   Ht 5\' 4"  (1.626 m)   Wt 149 lb 6.4 oz (67.8 kg)   BMI 25.64 kg/m  Gen: NAD, alert, cooperative with exam HEENT: NCAT CV: RRR, good S1/S2, no murmur Resp: CTABL, no wheezes, non-labored Ext: No edema, warm Neuro: Alert and oriented, No gross deficits  Assessment and plan:  # Type 2 diabetes Control improving somewhat, still uncontrolled with A1c of 8.4. Continue current regimen, difficult to manage situation, she is following closely with her clinical pharmacist. May need endocrinology  # Generalized anxiety disorder Refill Ativan, increase the number of pills to 30 per month. Still maintain only 1 pill once daily as needed. Continue SSRI  # Recurrent UTI Now with multiple resistances. Patient has established with urology. Culture, no signs of sepsis or serious infection today.  # Chronic low back pain Stop all NSAIDs with chronic abdominal pain and considering her age, she has been taking 15 mg of Motrin daily plus when necessary naproxen. Given tramadol for pain, stress only as needed dosing. Only to place her on a contract [place] controlled substance database reviewed with no red flags    Orders  Placed This Encounter  Procedures  . Urine Culture  . Bayer DCA Hb A1c Waived  . Urinalysis, Complete    Meds ordered this encounter  Medications  . LORazepam (ATIVAN) 0.5 MG tablet    Sig: TAKE (1) TABLET TWICE DAILY as needed for anxiety    Dispense:  30 tablet    Refill:  2  . traMADol (ULTRAM) 50 MG tablet    Sig: Take 1 tablet (50 mg total) by mouth every 6 (six) hours as needed.    Dispense:  30 tablet    Refill:  [place], MD [facility] 06/16/2017, 9:48 AM

## 2017-06-17 ENCOUNTER — Ambulatory Visit: Payer: Medicare Other | Admitting: Family Medicine

## 2017-06-19 LAB — URINE CULTURE

## 2017-06-21 ENCOUNTER — Other Ambulatory Visit: Payer: Self-pay | Admitting: Family Medicine

## 2017-06-21 MED ORDER — CIPROFLOXACIN HCL 250 MG PO TABS: 250.0000 mg | ORAL_TABLET | Freq: Two times a day (BID) | ORAL | 0 refills | Status: DC

## 2017-06-21 NOTE — Progress Notes (Signed)
Patient aware. Will pick up rx °

## 2017-07-02 ENCOUNTER — Other Ambulatory Visit: Payer: Self-pay | Admitting: Family Medicine

## 2017-07-02 DIAGNOSIS — E785 Hyperlipidemia, unspecified: Secondary | ICD-10-CM

## 2017-07-02 DIAGNOSIS — Z794 Long term (current) use of insulin: Secondary | ICD-10-CM

## 2017-07-02 DIAGNOSIS — E119 Type 2 diabetes mellitus without complications: Secondary | ICD-10-CM

## 2017-07-02 DIAGNOSIS — I10 Essential (primary) hypertension: Secondary | ICD-10-CM

## 2017-07-02 DIAGNOSIS — F411 Generalized anxiety disorder: Secondary | ICD-10-CM

## 2017-07-05 ENCOUNTER — Encounter: Payer: Medicare Other | Admitting: *Deleted

## 2017-07-05 DIAGNOSIS — Z1231 Encounter for screening mammogram for malignant neoplasm of breast: Secondary | ICD-10-CM | POA: Diagnosis not present

## 2017-08-10 DIAGNOSIS — L84 Corns and callosities: Secondary | ICD-10-CM | POA: Diagnosis not present

## 2017-08-10 DIAGNOSIS — E1142 Type 2 diabetes mellitus with diabetic polyneuropathy: Secondary | ICD-10-CM | POA: Diagnosis not present

## 2017-08-10 DIAGNOSIS — M79676 Pain in unspecified toe(s): Secondary | ICD-10-CM | POA: Diagnosis not present

## 2017-08-10 DIAGNOSIS — B351 Tinea unguium: Secondary | ICD-10-CM | POA: Diagnosis not present

## 2017-09-03 ENCOUNTER — Other Ambulatory Visit: Payer: Self-pay | Admitting: Family Medicine

## 2017-09-03 DIAGNOSIS — I25709 Atherosclerosis of coronary artery bypass graft(s), unspecified, with unspecified angina pectoris: Secondary | ICD-10-CM

## 2017-09-10 ENCOUNTER — Other Ambulatory Visit (HOSPITAL_COMMUNITY)
Admission: RE | Admit: 2017-09-10 | Discharge: 2017-09-10 | Disposition: A | Discharge status: Home / Self Care | Payer: Medicare Other | Source: Other Acute Inpatient Hospital | Attending: Urology | Admitting: Urology

## 2017-09-10 ENCOUNTER — Ambulatory Visit (INDEPENDENT_AMBULATORY_CARE_PROVIDER_SITE_OTHER): Payer: Medicare Other | Admitting: Urology

## 2017-09-10 DIAGNOSIS — R1032 Left lower quadrant pain: Secondary | ICD-10-CM

## 2017-09-10 DIAGNOSIS — N390 Urinary tract infection, site not specified: Secondary | ICD-10-CM

## 2017-09-10 DIAGNOSIS — N200 Calculus of kidney: Secondary | ICD-10-CM | POA: Diagnosis not present

## 2017-09-10 DIAGNOSIS — N3946 Mixed incontinence: Secondary | ICD-10-CM | POA: Diagnosis not present

## 2017-09-13 LAB — URINE CULTURE

## 2017-09-17 ENCOUNTER — Ambulatory Visit: Payer: Medicare Other | Admitting: Family Medicine

## 2017-09-27 ENCOUNTER — Other Ambulatory Visit: Payer: Self-pay | Admitting: Family Medicine

## 2017-09-27 ENCOUNTER — Other Ambulatory Visit: Payer: Medicare Other

## 2017-09-27 DIAGNOSIS — R8281 Pyuria: Secondary | ICD-10-CM

## 2017-09-27 DIAGNOSIS — Z794 Long term (current) use of insulin: Principal | ICD-10-CM

## 2017-09-27 DIAGNOSIS — E119 Type 2 diabetes mellitus without complications: Secondary | ICD-10-CM | POA: Diagnosis not present

## 2017-09-27 DIAGNOSIS — I10 Essential (primary) hypertension: Secondary | ICD-10-CM

## 2017-09-27 DIAGNOSIS — E785 Hyperlipidemia, unspecified: Secondary | ICD-10-CM

## 2017-09-27 DIAGNOSIS — N390 Urinary tract infection, site not specified: Secondary | ICD-10-CM | POA: Diagnosis not present

## 2017-09-27 LAB — URINALYSIS, COMPLETE
Bilirubin, UA: NEGATIVE
GLUCOSE, UA: NEGATIVE
NITRITE UA: NEGATIVE
Specific Gravity, UA: 1.025 (ref 1.005–1.030)
Urobilinogen, Ur: 0.2 mg/dL (ref 0.2–1.0)
pH, UA: 5 (ref 5.0–7.5)

## 2017-09-27 LAB — MICROSCOPIC EXAMINATION
Renal Epithel, UA: NONE SEEN /hpf
WBC, UA: >30 /hpf — AB (ref 0–?)

## 2017-09-27 LAB — BAYER DCA HB A1C WAIVED: HB A1C: 7.9 % — AB (ref <7.0)

## 2017-09-28 ENCOUNTER — Encounter: Payer: Self-pay | Admitting: Family Medicine

## 2017-09-28 ENCOUNTER — Ambulatory Visit (INDEPENDENT_AMBULATORY_CARE_PROVIDER_SITE_OTHER): Payer: Medicare Other | Admitting: Family Medicine

## 2017-09-28 VITALS — BP 140/79 | HR 83 | Temp 99.3°F | Ht 64.0 in | Wt 150.4 lb

## 2017-09-28 DIAGNOSIS — I10 Essential (primary) hypertension: Secondary | ICD-10-CM | POA: Diagnosis not present

## 2017-09-28 DIAGNOSIS — F411 Generalized anxiety disorder: Secondary | ICD-10-CM

## 2017-09-28 DIAGNOSIS — Z794 Long term (current) use of insulin: Secondary | ICD-10-CM | POA: Diagnosis not present

## 2017-09-28 DIAGNOSIS — E119 Type 2 diabetes mellitus without complications: Secondary | ICD-10-CM | POA: Diagnosis not present

## 2017-09-28 DIAGNOSIS — N390 Urinary tract infection, site not specified: Secondary | ICD-10-CM | POA: Diagnosis not present

## 2017-09-28 LAB — CMP14+EGFR
A/G RATIO: 1.3 (ref 1.2–2.2)
ALBUMIN: 4.1 g/dL (ref 3.5–4.7)
ALK PHOS: 58 IU/L (ref 39–117)
ALT: 10 IU/L (ref 0–32)
AST: 12 IU/L (ref 0–40)
BILIRUBIN TOTAL: 0.3 mg/dL (ref 0.0–1.2)
BUN/Creatinine Ratio: 26 (ref 12–28)
BUN: 25 mg/dL (ref 8–27)
CHLORIDE: 99 mmol/L (ref 96–106)
CO2: 20 mmol/L (ref 20–29)
CREATININE: 0.98 mg/dL (ref 0.57–1.00)
Calcium: 10.1 mg/dL (ref 8.7–10.3)
GFR calc Af Amer: 62 mL/min/{1.73_m2} (ref >59)
GFR calc non Af Amer: 54 mL/min/{1.73_m2} — ABNORMAL LOW (ref >59)
Globulin, Total: 3.1 g/dL (ref 1.5–4.5)
Glucose: 211 mg/dL — ABNORMAL HIGH (ref 65–99)
Potassium: 4.8 mmol/L (ref 3.5–5.2)
SODIUM: 137 mmol/L (ref 134–144)
Total Protein: 7.2 g/dL (ref 6.0–8.5)

## 2017-09-28 LAB — LIPID PANEL
Chol/HDL Ratio: 2.4 ratio (ref 0.0–4.4)
Cholesterol, Total: 174 mg/dL (ref 100–199)
HDL: 74 mg/dL (ref >39)
LDL Calculated: 67 mg/dL (ref 0–99)
Triglycerides: 167 mg/dL — ABNORMAL HIGH (ref 0–149)
VLDL Cholesterol Cal: 33 mg/dL (ref 5–40)

## 2017-09-28 LAB — CBC WITH DIFFERENTIAL/PLATELET
Basophils Absolute: 0 10*3/uL (ref 0.0–0.2)
Basos: 0 %
EOS (ABSOLUTE): 0.3 10*3/uL (ref 0.0–0.4)
EOS: 4 %
HEMATOCRIT: 35.2 % (ref 34.0–46.6)
HEMOGLOBIN: 11.7 g/dL (ref 11.1–15.9)
Immature Grans (Abs): 0 10*3/uL (ref 0.0–0.1)
Immature Granulocytes: 0 %
LYMPHS ABS: 1.6 10*3/uL (ref 0.7–3.1)
Lymphs: 22 %
MCH: 28.6 pg (ref 26.6–33.0)
MCHC: 33.2 g/dL (ref 31.5–35.7)
MCV: 86 fL (ref 79–97)
MONOCYTES: 6 %
MONOS ABS: 0.5 10*3/uL (ref 0.1–0.9)
NEUTROS ABS: 5.1 10*3/uL (ref 1.4–7.0)
NEUTROS PCT: 68 %
Platelets: 207 10*3/uL (ref 150–379)
RBC: 4.09 x10E6/uL (ref 3.77–5.28)
RDW: 15.4 % (ref 12.3–15.4)
WBC: 7.5 10*3/uL (ref 3.4–10.8)

## 2017-09-28 MED ORDER — LORAZEPAM 0.5 MG PO TABS: ORAL_TABLET | ORAL | 2 refills | Status: DC

## 2017-09-28 MED ORDER — METOPROLOL TARTRATE 50 MG PO TABS: 50.0000 mg | ORAL_TABLET | Freq: Two times a day (BID) | ORAL | 5 refills | Status: DC

## 2017-09-28 NOTE — Patient Instructions (Signed)
Great to see you!  Come back in 3 months unless you need us sooner.    

## 2017-09-28 NOTE — Progress Notes (Signed)
   HPI  Patient presents today here to follow-up for chronic medical conditions.  Patient had an episode of vomiting this morning.  She states that since that time she feels more like normal she complains of chronic lower abdominal pain bilaterally.  She denies any aggravating or relieving factors. Her daughters are present, they explained that they all had some fat back that "didnt taste right".  She appears to be back to normal to them.   Diabetes Watching diet minimally. No regular exercise. Good medication compliance.  Frequent UTI No new symptoms, patient has been started on daily trimethoprim for prevention.  She is tolerating this well  HTN No CP or HA.  Good med compliance.   GAD- Needs refill.    PMH: Smoking status noted ROS: Per HPI  Objective: BP 140/79   Pulse 83   Temp 99.3 F (37.4 C) (Oral)   Ht 5\' 4"  (1.626 m)   Wt 150 lb 6.4 oz (68.2 kg)   BMI 25.82 kg/m  Gen: NAD, alert, cooperative with exam HEENT: NCAT CV: RRR, good S1/S2, no murmur Resp: CTABL, no wheezes, non-labored Abd: Soft, mild tenderness to palpation bilateral lower quadrant, no guarding Ext: No edema, warm Neuro: Alert and oriented, No gross deficits  Assessment and plan:  #Hypertension Reasonably well-controlled No changes Labs reviewed  #Frequent UTI No discrete symptoms today. Urine has multiple findings which are persistently present. Culture pending.  # GAD Stable, Refil ativan.   # T2DM A1C controlled, no Changes  Continue metformin at low dose, levemir   Meds ordered this encounter  Medications  . metoprolol tartrate (LOPRESSOR) 50 MG tablet    Sig: Take 1 tablet (50 mg total) by mouth 2 (two) times daily.    Dispense:  60 tablet    Refill:  5  . LORazepam (ATIVAN) 0.5 MG tablet    Sig: TAKE (1) TABLET TWICE DAILY as needed for anxiety    Dispense:  30 tablet    Refill:  2    Laroy Apple, MD [facility] 09/28/2017, 3:18  PM

## 2017-10-04 ENCOUNTER — Other Ambulatory Visit: Payer: Self-pay | Admitting: Family Medicine

## 2017-10-04 DIAGNOSIS — F411 Generalized anxiety disorder: Secondary | ICD-10-CM

## 2017-10-19 DIAGNOSIS — E1142 Type 2 diabetes mellitus with diabetic polyneuropathy: Secondary | ICD-10-CM | POA: Diagnosis not present

## 2017-10-19 DIAGNOSIS — M79676 Pain in unspecified toe(s): Secondary | ICD-10-CM | POA: Diagnosis not present

## 2017-10-19 DIAGNOSIS — B351 Tinea unguium: Secondary | ICD-10-CM | POA: Diagnosis not present

## 2017-10-19 DIAGNOSIS — L84 Corns and callosities: Secondary | ICD-10-CM | POA: Diagnosis not present

## 2017-11-01 ENCOUNTER — Other Ambulatory Visit: Payer: Self-pay | Admitting: *Deleted

## 2017-11-01 DIAGNOSIS — E119 Type 2 diabetes mellitus without complications: Secondary | ICD-10-CM

## 2017-11-01 DIAGNOSIS — Z794 Long term (current) use of insulin: Principal | ICD-10-CM

## 2017-11-01 DIAGNOSIS — I10 Essential (primary) hypertension: Secondary | ICD-10-CM

## 2017-11-01 MED ORDER — METFORMIN HCL ER 500 MG PO TB24: ORAL_TABLET | ORAL | 2 refills | Status: DC

## 2017-11-01 MED ORDER — METOPROLOL TARTRATE 50 MG PO TABS: 50.0000 mg | ORAL_TABLET | Freq: Two times a day (BID) | ORAL | 5 refills | Status: DC

## 2017-11-01 MED ORDER — NITROGLYCERIN 0.4 MG SL SUBL: SUBLINGUAL_TABLET | SUBLINGUAL | 4 refills | Status: DC

## 2017-11-23 ENCOUNTER — Ambulatory Visit (INDEPENDENT_AMBULATORY_CARE_PROVIDER_SITE_OTHER): Payer: Medicare Other

## 2017-11-23 ENCOUNTER — Telehealth: Payer: Self-pay | Admitting: Physician Assistant

## 2017-11-23 ENCOUNTER — Ambulatory Visit (INDEPENDENT_AMBULATORY_CARE_PROVIDER_SITE_OTHER): Payer: Medicare Other | Admitting: Physician Assistant

## 2017-11-23 VITALS — BP 151/67 | HR 53 | Temp 97.8°F | Ht 64.0 in | Wt 150.0 lb

## 2017-11-23 DIAGNOSIS — S299XXA Unspecified injury of thorax, initial encounter: Secondary | ICD-10-CM | POA: Diagnosis not present

## 2017-11-23 DIAGNOSIS — R0781 Pleurodynia: Secondary | ICD-10-CM

## 2017-11-23 DIAGNOSIS — W19XXXA Unspecified fall, initial encounter: Secondary | ICD-10-CM

## 2017-11-23 DIAGNOSIS — R531 Weakness: Secondary | ICD-10-CM | POA: Diagnosis not present

## 2017-11-23 DIAGNOSIS — D509 Iron deficiency anemia, unspecified: Secondary | ICD-10-CM | POA: Diagnosis not present

## 2017-11-23 DIAGNOSIS — E119 Type 2 diabetes mellitus without complications: Secondary | ICD-10-CM

## 2017-11-23 DIAGNOSIS — J301 Allergic rhinitis due to pollen: Secondary | ICD-10-CM

## 2017-11-23 DIAGNOSIS — Z794 Long term (current) use of insulin: Secondary | ICD-10-CM

## 2017-11-23 DIAGNOSIS — R6889 Other general symptoms and signs: Secondary | ICD-10-CM | POA: Diagnosis not present

## 2017-11-23 LAB — BAYER DCA HB A1C WAIVED: HB A1C (BAYER DCA - WAIVED): 8 % — ABNORMAL HIGH (ref <7.0)

## 2017-11-23 LAB — GLUCOSE HEMOCUE WAIVED: Glu Hemocue Waived: 186 mg/dL — ABNORMAL HIGH (ref 65–99)

## 2017-11-23 MED ORDER — LORATADINE 10 MG PO TABS: 10.0000 mg | ORAL_TABLET | Freq: Every day | ORAL | 11 refills | Status: DC

## 2017-11-23 NOTE — Patient Instructions (Signed)
In a few days you may receive a survey in the mail or online from [employer] regarding your visit with us today. Please take a moment to fill this out. Your feedback is very important to our whole office. It can help us better understand your needs as well as improve your experience and satisfaction. Thank you for taking your time to complete it. We care about you.  Geffrey Michaelsen, PA-C  

## 2017-11-23 NOTE — Progress Notes (Signed)
2 out of 6 apical nonradiating early peaking systolic murmur, no diastolic murmurs    BP (!) 151/67   Pulse (!) 53   Temp 97.8 F (36.6 C) (Oral)   Ht _0  (1.626 m)   Wt 150 lb (68 kg)   BMI 25.75 kg/m    Subjective:    [name], female    DOB: 27-Nov-1931, 83 y.o.   MRN: [id number]  HPI: [name] is a 83 y.o. female presenting on 11/23/2017 for Fall, weakness (fell at [facility] today while getting out of car, complains of being weaker than normal this week, BS elevated 584 last night)  Her glucose was 186 today in the office.  She has noted vertigo and has more congestion and headache.  She hurts in the left lateral ribs from the fall. Daughter notes she has been more fatigued and feeling poorly in recent days. No fever or severe congestion.  No NVD. Known history of CVA, iron deficiency, CHF.  Relevant past medical, surgical, family and social history reviewed and updated as indicated. Allergies and medications reviewed and updated.  Past Medical History:  Diagnosis Date  . Anemia   . Anxiety   . Arthritis    back, knees, and hips  . Asthma    with allergies  . Balance problems   . CAD (coronary artery disease)   . Carotid stenosis   . CHF (congestive heart failure) ([place])    EF preserved Echo 2012  . Chronic kidney infection   . 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 Dementia    "a little"  . Depression   . Diabetes mellitus    type 2  . Furuncle of back, except buttock   . GERD (gastroesophageal reflux disease)   . H/O seasonal allergies   . Hearing loss   . Hypertension   . Iron deficiency anemia 05/27/2011  . Myocardial infarction ([place])   . Pneumonia    hx of  . Renal insufficiency   . Shortness of breath dyspnea    with activity  . Speech problem    from stroke  . Swallowing difficulty   . Vertigo    "when sugar gets low"  . Vision loss    left eye-"half blind"    Past Surgical History:  Procedure Laterality Date  . APPENDECTOMY     with hysterectomy  . CATARACT EXTRACTION Bilateral 5 years ago  . CHOLECYSTECTOMY    . CORONARY ANGIOPLASTY     prior to 2006 5 stents  . CORONARY ARTERY BYPASS GRAFT  2006  . I & D of Furuncle  April 2013  . KNEE SURGERY Right   . TOTAL ABDOMINAL HYSTERECTOMY  ~83 years old   complete, with tumor removal  . TOTAL KNEE ARTHROPLASTY Right 08/13/2015   Procedure: RIGHT  TOTAL KNEE ARTHROPLASTY;  Surgeon: Paralee Cancel, MD;  Location: [facility];  Service: Orthopedics;  Laterality: Right;    Review of Systems  Constitutional: Positive for fatigue. Negative for activity change, chills and fever.  HENT: Positive for congestion and postnasal drip. Negative for ear pain, sinus pain and sore throat.   Eyes: Negative.   Respiratory: Negative.  Negative for cough, shortness of breath and wheezing.   Cardiovascular: Negative.  Negative for chest pain, palpitations and leg swelling.  Gastrointestinal: Negative.  Negative for abdominal pain.  Endocrine: Negative.   Genitourinary: Negative.  Negative for dysuria.  Musculoskeletal: Negative.  Negative for arthralgias.  Skin: Negative.   Neurological: Positive for dizziness and weakness.    Allergies as of 11/23/2017      Reactions   Advil [ibuprofen] Swelling   Heparin Other (See Comments)   Confusion   Propoxyphene N-acetaminophen Other (See Comments)   Numbness all over. "floating" sensation.      Medication List        Accurate as of 11/23/17  9:34 PM. Always use your most recent med list.          Alendronate Sodium 70 MG Tbef Commonly known as:  BINOSTO Mix 1 effervescent tablet with at least 4 ounces of water and allow to dissolve completely (about 10 min). Then drink entire mixture once a WEEK.  Drink prior to any food and do not eat, drink anything other than water or lie down for 30 minutes after drinking mixture.   amLODipine 10  MG tablet Commonly known as:  NORVASC TAKE (1) TABLET BY MOUTH ONCE DAILY.   atorvastatin 20 MG tablet Commonly known as:  LIPITOR TAKE 1 TABLET BY MOUTH AT BEDTIME.   clopidogrel 75 MG tablet Commonly known as:  PLAVIX TAKE (1) TABLET BY MOUTH ONCE DAILY.   DAILY VITAMINS/IRON/BETA CAROT PO Take 1 tablet by mouth at bedtime.   DRY EYES OP Apply 1 drop to eye daily as needed (dry/red eyes).   EASY TOUCH INSULIN SYRINGE 31G X 5/16" 1 ML Misc Generic drug:  Insulin Syringe-Needle U-100 USE AS DIRECTED.   esomeprazole 40 MG capsule Commonly known as:  NEXIUM Take 1 capsule (40 mg total) by mouth daily.   ferrous sulfate 325 (65 FE) MG tablet TAKE (1) TABLET THREE TIMES DAILY AFTER MEALS.   furosemide 20 MG tablet Commonly known as:  LASIX Take 1 tablet (20 mg total) by mouth daily as needed for fluid or edema.   HUMALOG KWIKPEN 100 UNIT/ML KiwkPen Generic drug:  insulin lispro Inject 4-10 Units into the skin daily as needed (high blood sugar).   Insulin Detemir 100 UNIT/ML Pen Commonly known as:  LEVEMIR FLEXTOUCH Inject 22-28 Units into the skin daily.   Insulin Pen Needle 31G X 5 MM Misc Use with  Levemir injection   GLOBAL EASE INJECT PEN NEEDLES 31G X 8 MM Misc Generic drug:  Insulin Pen Needle AS DIRECTED   isosorbide mononitrate 30 MG 24 hr tablet Commonly known as:  IMDUR TAKE (1) TABLET BY MOUTH ONCE DAILY.   lisinopril 20 MG tablet Commonly known as:  PRINIVIL,ZESTRIL TAKE (1) TABLET BY MOUTH ONCE DAILY.   loratadine 10 MG tablet Commonly known as:  CLARITIN Take 1 tablet (10 mg total) by mouth daily.   LORazepam 0.5 MG tablet Commonly known as:  ATIVAN TAKE (1) TABLET TWICE DAILY as needed for anxiety   meclizine 25 MG tablet Commonly known as:  ANTIVERT TAKE 1 TABLET TWICE DAILY AS NEEDED FOR DIZZINESS.   metFORMIN 500 MG 24 hr tablet Commonly known as:  GLUCOPHAGE-XR TAKE 1 TABLET WITH BREAKFAST AND 1 TABLET WITH SUPPER.   metoprolol  tartrate 50 MG tablet Commonly known as:  LOPRESSOR Take 1 tablet (50 mg total) by mouth 2 (two) times daily.   nitroGLYCERIN 0.4 MG SL tablet Commonly known as:  NITROSTAT PLACE ONE (1) TABLET UNDER TONGUE EVERY 5 MINUTES UP TO (3) DOSES AS NEEDED FOR CHEST PAIN.   PARoxetine 20 MG tablet Commonly known as:  PAXIL TAKE (1) TABLET BY MOUTH ONCE DAILY.  traMADol 50 MG tablet Commonly known as:  ULTRAM Take 1 tablet (50 mg total) by mouth every 6 (six) hours as needed.   trimethoprim 100 MG tablet Commonly known as:  TRIMPEX Take 100 mg by mouth daily.          Objective:    BP (!) 151/67   Pulse (!) 53   Temp 97.8 F (36.6 C) (Oral)   Ht _0  (1.626 m)   Wt 150 lb (68 kg)   BMI 25.75 kg/m   Allergies  Allergen Reactions  . Advil [Ibuprofen] Swelling  . Heparin Other (See Comments)    Confusion  . Propoxyphene N-Acetaminophen Other (See Comments)    Numbness all over. "floating" sensation.    Physical Exam  Constitutional: She is oriented to person, place, and time. She appears well-developed and well-nourished. No distress.  Decreased concentration at first but clearer later.  Tongue thrusting is normal habit  HENT:  Head: Normocephalic and atraumatic.  Right Ear: Tympanic membrane, external ear and ear canal normal.  Left Ear: Tympanic membrane, external ear and ear canal normal.  Nose: Nose normal. No rhinorrhea.  Mouth/Throat: Oropharynx is clear and moist and mucous membranes are normal. No oropharyngeal exudate or posterior oropharyngeal erythema.  Eyes: Conjunctivae and EOM are normal. Pupils are equal, round, and reactive to light.  Neck: Normal range of motion. Neck supple.  Cardiovascular: Normal rate, regular rhythm, normal heart sounds and intact distal pulses.  Pulmonary/Chest: Effort normal and breath sounds normal.  Abdominal: Soft. Bowel sounds are normal.  Musculoskeletal:       Thoracic back: She exhibits tenderness, bony tenderness and  pain. She exhibits no swelling, no edema, no deformity and no spasm.       Back:       Arms: Neurological: She is alert and oriented to person, place, and time. She has normal strength and normal reflexes. She is not disoriented. No cranial nerve deficit or sensory deficit.  Skin: Skin is warm and dry. No rash noted.  Psychiatric: She has a normal mood and affect. Her behavior is normal. Judgment and thought content normal.    Results for orders placed or performed in visit on 11/23/17  Glucose Hemocue Waived  Result Value Ref Range   Glu Hemocue Waived 186 (H) 65 - 99 mg/dL  Bayer DCA Hb A1c Waived  Result Value Ref Range   Bayer DCA Hb A1c Waived 8.0 (H) <7.0 %      Assessment & Plan:   1. Type 2 diabetes mellitus with insulin therapy (HCC) - Glucose Hemocue Waived  2. Fall, initial encounter - DG Ribs Unilateral W/Chest Left; Future - CMP14+EGFR - Anemia Profile B - Bayer DCA Hb A1c Waived Patient and her daughter are instructed to go to ED or call 911 if any of her symptoms worsen or she has any stroke symptoms.  3. Rib pain on left side - DG Ribs Unilateral W/Chest Left; Future  4. Iron deficiency anemia, unspecified iron deficiency anemia type - CMP14+EGFR - Anemia Profile B  5. Weakness - CMP14+EGFR - Anemia Profile B - Bayer DCA Hb A1c Waived  6. Allergic rhinitis due to pollen, unspecified seasonality - loratadine (CLARITIN) 10 MG tablet; Take 1 tablet (10 mg total) by mouth daily.  Dispense: 30 tablet; Refill: 11    Current Outpatient Medications:  .  Alendronate Sodium (BINOSTO) 70 MG TBEF, Mix 1 effervescent tablet with at least 4 ounces of water and allow to dissolve completely (about 10  min). Then drink entire mixture once a WEEK.  Drink prior to any food and do not eat, drink anything other than water or lie down for 30 minutes after drinking mixture., Disp: 4 tablet, Rfl: 11 .  amLODipine (NORVASC) 10 MG tablet, TAKE (1) TABLET BY MOUTH ONCE DAILY.,  Disp: 30 tablet, Rfl: 4 .  Artificial Tear Ointment (DRY EYES OP), Apply 1 drop to eye daily as needed (dry/red eyes). , Disp: , Rfl:  .  atorvastatin (LIPITOR) 20 MG tablet, TAKE 1 TABLET BY MOUTH AT BEDTIME., Disp: 30 tablet, Rfl: 5 .  clopidogrel (PLAVIX) 75 MG tablet, TAKE (1) TABLET BY MOUTH ONCE DAILY., Disp: 30 tablet, Rfl: 5 .  EASY TOUCH INSULIN SYRINGE 31G X 5/16" 1 ML MISC, USE AS DIRECTED., Disp: 80 each, Rfl: 2 .  esomeprazole (NEXIUM) 40 MG capsule, Take 1 capsule (40 mg total) by mouth daily., Disp: 30 capsule, Rfl: 6 .  ferrous sulfate 325 (65 FE) MG tablet, TAKE (1) TABLET THREE TIMES DAILY AFTER MEALS. (Patient taking differently: 1 tablet qhs), Disp: 90 tablet, Rfl: 0 .  furosemide (LASIX) 20 MG tablet, Take 1 tablet (20 mg total) by mouth daily as needed for fluid or edema., Disp: 30 tablet, Rfl: 6 .  GLOBAL EASE INJECT PEN NEEDLES 31G X 8 MM MISC, AS DIRECTED, Disp: 100 each, Rfl: 2 .  Insulin Detemir (LEVEMIR FLEXTOUCH) 100 UNIT/ML Pen, Inject 22-28 Units into the skin daily., Disp: 15 mL, Rfl: 2 .  insulin lispro (HUMALOG KWIKPEN) 100 UNIT/ML KiwkPen, Inject 4-10 Units into the skin daily as needed (high blood sugar)., Disp: , Rfl:  .  Insulin Pen Needle 31G X 5 MM MISC, Use with  Levemir injection, Disp: 100 each, Rfl: 0 .  isosorbide mononitrate (IMDUR) 30 MG 24 hr tablet, TAKE (1) TABLET BY MOUTH ONCE DAILY., Disp: 30 tablet, Rfl: 3 .  lisinopril (PRINIVIL,ZESTRIL) 20 MG tablet, TAKE (1) TABLET BY MOUTH ONCE DAILY., Disp: 30 tablet, Rfl: 4 .  loratadine (CLARITIN) 10 MG tablet, Take 1 tablet (10 mg total) by mouth daily., Disp: 30 tablet, Rfl: 11 .  LORazepam (ATIVAN) 0.5 MG tablet, TAKE (1) TABLET TWICE DAILY as needed for anxiety, Disp: 30 tablet, Rfl: 2 .  meclizine (ANTIVERT) 25 MG tablet, TAKE 1 TABLET TWICE DAILY AS NEEDED FOR DIZZINESS., Disp: 60 tablet, Rfl: 2 .  metFORMIN (GLUCOPHAGE-XR) 500 MG 24 hr tablet, TAKE 1 TABLET WITH BREAKFAST AND 1 TABLET WITH SUPPER.,  Disp: 60 tablet, Rfl: 2 .  metoprolol tartrate (LOPRESSOR) 50 MG tablet, Take 1 tablet (50 mg total) by mouth 2 (two) times daily., Disp: 60 tablet, Rfl: 5 .  Multiple Vitamins-Iron (DAILY VITAMINS/IRON/BETA CAROT PO), Take 1 tablet by mouth at bedtime., Disp: , Rfl:  .  nitroGLYCERIN (NITROSTAT) 0.4 MG SL tablet, PLACE ONE (1) TABLET UNDER TONGUE EVERY 5 MINUTES UP TO (3) DOSES AS NEEDED FOR CHEST PAIN., Disp: 25 tablet, Rfl: 4 .  PARoxetine (PAXIL) 20 MG tablet, TAKE (1) TABLET BY MOUTH ONCE DAILY., Disp: 30 tablet, Rfl: 1 .  traMADol (ULTRAM) 50 MG tablet, Take 1 tablet (50 mg total) by mouth every 6 (six) hours as needed., Disp: 30 tablet, Rfl: 2 .  trimethoprim (TRIMPEX) 100 MG tablet, Take 100 mg by mouth daily., Disp: , Rfl:  Continue all other maintenance medications as listed above.  Follow up plan: Keep follow up with PCP and cardiologist  Educational handout given for [place] PA-C [facility]  [address]  [place], [place] [address] [phone]   11/23/2017, 9:34 PM

## 2017-11-23 NOTE — Telephone Encounter (Signed)
At 6:30 pm there is still no final read on the xray.

## 2017-11-24 LAB — ANEMIA PROFILE B
BASOS ABS: 0 10*3/uL (ref 0.0–0.2)
Basos: 1 %
EOS (ABSOLUTE): 0.2 10*3/uL (ref 0.0–0.4)
Eos: 4 %
FOLATE: 15.9 ng/mL (ref >3.0)
Ferritin: 64 ng/mL (ref 15–150)
Hematocrit: 35.4 % (ref 34.0–46.6)
Hemoglobin: 11.8 g/dL (ref 11.1–15.9)
IMMATURE GRANULOCYTES: 0 %
IRON: 53 ug/dL (ref 27–139)
Immature Grans (Abs): 0 10*3/uL (ref 0.0–0.1)
Iron Saturation: 17 % (ref 15–55)
LYMPHS ABS: 1.6 10*3/uL (ref 0.7–3.1)
Lymphs: 27 %
MCH: 28.5 pg (ref 26.6–33.0)
MCHC: 33.3 g/dL (ref 31.5–35.7)
MCV: 86 fL (ref 79–97)
Monocytes Absolute: 0.4 10*3/uL (ref 0.1–0.9)
Monocytes: 7 %
NEUTROS PCT: 61 %
Neutrophils Absolute: 3.6 10*3/uL (ref 1.4–7.0)
PLATELETS: 205 10*3/uL (ref 150–379)
RBC: 4.14 x10E6/uL (ref 3.77–5.28)
RDW: 15.1 % (ref 12.3–15.4)
Retic Ct Pct: 1.1 % (ref 0.6–2.6)
TIBC: 306 ug/dL (ref 250–450)
UIBC: 253 ug/dL (ref 118–369)
Vitamin B-12: 715 pg/mL (ref 232–1245)
WBC: 5.8 10*3/uL (ref 3.4–10.8)

## 2017-11-24 LAB — CMP14+EGFR
ALBUMIN: 4.1 g/dL (ref 3.5–4.7)
ALK PHOS: 49 IU/L (ref 39–117)
ALT: 6 IU/L (ref 0–32)
AST: 10 IU/L (ref 0–40)
Albumin/Globulin Ratio: 1.5 (ref 1.2–2.2)
BUN / CREAT RATIO: 24 (ref 12–28)
BUN: 37 mg/dL — ABNORMAL HIGH (ref 8–27)
CHLORIDE: 103 mmol/L (ref 96–106)
CO2: 21 mmol/L (ref 20–29)
Calcium: 10 mg/dL (ref 8.7–10.3)
Creatinine, Ser: 1.54 mg/dL — ABNORMAL HIGH (ref 0.57–1.00)
GFR calc Af Amer: 35 mL/min/{1.73_m2} — ABNORMAL LOW (ref >59)
GFR calc non Af Amer: 31 mL/min/{1.73_m2} — ABNORMAL LOW (ref >59)
GLOBULIN, TOTAL: 2.8 g/dL (ref 1.5–4.5)
Glucose: 170 mg/dL — ABNORMAL HIGH (ref 65–99)
Potassium: 4.8 mmol/L (ref 3.5–5.2)
SODIUM: 140 mmol/L (ref 134–144)
Total Protein: 6.9 g/dL (ref 6.0–8.5)

## 2017-12-03 ENCOUNTER — Other Ambulatory Visit: Payer: Self-pay

## 2017-12-03 ENCOUNTER — Other Ambulatory Visit: Payer: Self-pay | Admitting: Family Medicine

## 2017-12-03 DIAGNOSIS — Z794 Long term (current) use of insulin: Principal | ICD-10-CM

## 2017-12-03 DIAGNOSIS — F411 Generalized anxiety disorder: Secondary | ICD-10-CM

## 2017-12-03 DIAGNOSIS — E119 Type 2 diabetes mellitus without complications: Secondary | ICD-10-CM

## 2017-12-03 MED ORDER — PAROXETINE HCL 20 MG PO TABS: ORAL_TABLET | ORAL | 2 refills | Status: DC

## 2017-12-19 ENCOUNTER — Encounter: Payer: Self-pay | Admitting: Cardiology

## 2017-12-19 NOTE — Progress Notes (Addendum)
HPI The patient presents for one-year followup of CAD.  I saw her in Dec 2017.  Since I last saw her she has had no new cardiogram back and joint pains.  She has had several fall secondary to a virus that caused some inner ear problems.  She is limited by joints and muscle aches but she has no acute cardiovascular problems. The patient denies any new symptoms such as chest discomfort, neck or arm discomfort. There has been no new shortness of breath, PND or orthopnea. There have been no reported palpitations, presyncope or syncope.  She does not take her medications routinely and in fact did not take her medicines this morning and that is why she thinks her blood pressure is up.  She tends to forget even though her daughter is there to remind her .   Allergies  Allergen Reactions  . Advil [Ibuprofen] Swelling  . Heparin Other (See Comments)    Confusion  . Propoxyphene N-Acetaminophen Other (See Comments)    Numbness all over. "floating" sensation.    Current Outpatient Medications  Medication Sig Dispense Refill  . amLODipine (NORVASC) 10 MG tablet TAKE (1) TABLET BY MOUTH ONCE DAILY. 30 tablet 5  . Artificial Tear Ointment (DRY EYES OP) Apply 1 drop to eye daily as needed (dry/red eyes).     Marland Kitchen aspirin 81 MG chewable tablet Chew 81 mg by mouth at bedtime.    Marland Kitchen atorvastatin (LIPITOR) 20 MG tablet TAKE 1 TABLET BY MOUTH AT BEDTIME. 30 tablet 5  . clopidogrel (PLAVIX) 75 MG tablet TAKE (1) TABLET BY MOUTH ONCE DAILY. 30 tablet 5  . EASY TOUCH INSULIN SYRINGE 31G X 5/16" 1 ML MISC USE AS DIRECTED. 80 each 2  . esomeprazole (NEXIUM) 40 MG capsule Take 1 capsule (40 mg total) by mouth daily. 30 capsule 6  . ferrous sulfate 325 (65 FE) MG tablet Take 1 tablet by mouth at bedtime.    . furosemide (LASIX) 20 MG tablet Take 1 tablet (20 mg total) by mouth daily as needed for fluid or edema. 30 tablet 6  . GLOBAL EASE INJECT PEN NEEDLES 31G X 8 MM MISC AS DIRECTED 100 each 2  . Insulin Detemir  (LEVEMIR FLEXTOUCH) 100 UNIT/ML Pen Inject 22-28 Units into the skin daily. 15 mL 2  . insulin lispro (HUMALOG KWIKPEN) 100 UNIT/ML KiwkPen Inject 4-10 Units into the skin daily as needed (high blood sugar).    . Insulin Pen Needle 31G X 5 MM MISC Use with  Levemir injection 100 each 0  . isosorbide mononitrate (IMDUR) 30 MG 24 hr tablet TAKE (1) TABLET BY MOUTH ONCE DAILY. 30 tablet 3  . lisinopril (PRINIVIL,ZESTRIL) 20 MG tablet TAKE (1) TABLET BY MOUTH ONCE DAILY. 30 tablet 5  . loratadine (CLARITIN) 10 MG tablet Take 1 tablet (10 mg total) by mouth daily. 30 tablet 11  . LORazepam (ATIVAN) 0.5 MG tablet TAKE (1) TABLET TWICE DAILY as needed for anxiety 30 tablet 2  . meclizine (ANTIVERT) 25 MG tablet TAKE 1 TABLET TWICE DAILY AS NEEDED FOR DIZZINESS. 60 tablet 2  . metFORMIN (GLUCOPHAGE-XR) 500 MG 24 hr tablet TAKE 1 TABLET WITH BREAKFAST AND 1 TABLET WITH SUPPER. 60 tablet 2  . methenamine (HIPREX) 1 g tablet Take 1 tablet by mouth 2 (two) times daily.    . metoprolol tartrate (LOPRESSOR) 50 MG tablet Take 1 tablet (50 mg total) by mouth 2 (two) times daily. 60 tablet 5  . Multiple Vitamins-Iron (DAILY VITAMINS/IRON/BETA  CAROT PO) Take 1 tablet by mouth at bedtime.    . nitroGLYCERIN (NITROSTAT) 0.4 MG SL tablet PLACE ONE (1) TABLET UNDER TONGUE EVERY 5 MINUTES UP TO (3) DOSES AS NEEDED FOR CHEST PAIN. 25 tablet 4  . PARoxetine (PAXIL) 20 MG tablet TAKE (1) TABLET BY MOUTH ONCE DAILY. 30 tablet 2  . traMADol (ULTRAM) 50 MG tablet Take 1 tablet (50 mg total) by mouth every 6 (six) hours as needed. 30 tablet 2  . trimethoprim (TRIMPEX) 100 MG tablet Take 100 mg by mouth daily.    . Alendronate Sodium (BINOSTO) 70 MG TBEF Mix 1 effervescent tablet with at least 4 ounces of water and allow to dissolve completely (about 10 min). Then drink entire mixture once a WEEK.  Drink prior to any food and do not eat, drink anything other than water or lie down for 30 minutes after drinking mixture. (Patient  not taking: Reported on 12/22/2017) 4 tablet 11   No current facility-administered medications for this visit.     Past Medical History:  Diagnosis Date  . Anemia   . Anxiety   . Arthritis    back, knees, and hips  . Asthma    with allergies  . Balance problems   . CAD (coronary artery disease)   . Carotid stenosis   . CHF (congestive heart failure) ([place])    EF preserved Echo 2012  . 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 Dementia    "a little"  . Depression   . Diabetes mellitus    type 2  . GERD (gastroesophageal reflux disease)   . Hearing loss   . Hypertension   . Iron deficiency anemia 05/27/2011  . Myocardial infarction ([place])   . Renal insufficiency   . Shortness of breath dyspnea    with activity  . Vertigo    "when sugar gets low"  . Vision loss    left eye-"half blind"    Past Surgical History:  Procedure Laterality Date  . APPENDECTOMY     with hysterectomy  . CATARACT EXTRACTION Bilateral 5 years ago  . CHOLECYSTECTOMY    . CORONARY ANGIOPLASTY     prior to 2006 5 stents  . CORONARY ARTERY BYPASS GRAFT  2006  . I & D of Furuncle  April 2013  . KNEE SURGERY Right   . TOTAL ABDOMINAL HYSTERECTOMY  ~84 years old   complete, with tumor removal  . TOTAL KNEE ARTHROPLASTY Right 08/13/2015   Procedure: RIGHT  TOTAL KNEE ARTHROPLASTY;  Surgeon: Paralee Cancel, MD;  Location: [facility];  Service: Orthopedics;  Laterality: Right;    ROS:  Positive for significant back pain radiating around to her abdomen, chronic constipation hemorrhoids, chronic joint pains. Otherwise as stated in the HPI and negative for all other systems.  PHYSICAL EXAM BP (!) 165/75   Pulse 64   Ht 5\' 6"  (1.676 m)   Wt 150 lb (68 kg)   SpO2 100%   BMI 24.21 kg/m   GENERAL:  Well appearing NECK:  No jugular venous distention, waveform within normal limits, carotid upstroke brisk and symmetric, transmitted  systolic murmur versus bruits, no thyromegaly LUNGS:  Clear to auscultation bilaterally CHEST:  Unremarkable HEART:  PMI not displaced or sustained,S1 and S2 within normal limits, no S3, no S4, no clicks, no rubs, 3 out of 6 apical systolic murmur radiating slightly at the aortic outflow tract murmurs  ABD:  Flat, positive bowel sounds normal in frequency in pitch, no bruits, no rebound, no guarding, no midline pulsatile mass, no hepatomegaly, no splenomegaly EXT:  2 plus pulses upper and decreased DP/PT bilateral, no edema, no cyanosis no clubbing   EKG:  Sinus rhythm, rate 84, axis within normal limits, no acute ST-T wave changes 01/22/17    ASSESSMENT AND PLAN   CAD:  The patient has no new sypmtoms.  No further cardiovascular testing is indicated.  We will continue with aggressive risk reduction and meds as listed.  Of note I made a conscious decision to continue DAPT because of her strokes and the fact that when she came off of Plavix previously she had neurologic symptoms.  She and the family and I discussed the risk benefits of this.  CAROTID STENOSIS:  This was mild in 2017 and I will not repeat a study at this time.   HTN: Her blood pressure is elevated but she has not taken her medications.  Instructed to take these routinely as prescribed and to keep a blood pressure diary and I will review this in 2 weeks.   DM:  Her A1C is down to 8.0.  This is much improved.  She is instructed to take her diet.  No change in medications.  DYSLIPIDEMIA:  LDL was 67.  HDL 74.  She will remain on the meds as listed.    CKD:  Creat was elevated in Dec as above.   If this continues to rise we might need to reconsider the ACE inhibitor.  I will defer follow up labs to Timmothy Euler, MD

## 2017-12-22 ENCOUNTER — Ambulatory Visit (INDEPENDENT_AMBULATORY_CARE_PROVIDER_SITE_OTHER): Payer: Medicare Other | Admitting: Cardiology

## 2017-12-22 ENCOUNTER — Encounter: Payer: Self-pay | Admitting: Cardiology

## 2017-12-22 VITALS — BP 165/75 | HR 64 | Ht 66.0 in | Wt 150.0 lb

## 2017-12-22 DIAGNOSIS — N183 Chronic kidney disease, stage 3 unspecified: Secondary | ICD-10-CM

## 2017-12-22 DIAGNOSIS — E785 Hyperlipidemia, unspecified: Secondary | ICD-10-CM

## 2017-12-22 DIAGNOSIS — I2510 Atherosclerotic heart disease of native coronary artery without angina pectoris: Secondary | ICD-10-CM | POA: Diagnosis not present

## 2017-12-22 DIAGNOSIS — E1122 Type 2 diabetes mellitus with diabetic chronic kidney disease: Secondary | ICD-10-CM | POA: Insufficient documentation

## 2017-12-22 DIAGNOSIS — E118 Type 2 diabetes mellitus with unspecified complications: Secondary | ICD-10-CM | POA: Diagnosis not present

## 2017-12-22 DIAGNOSIS — I10 Essential (primary) hypertension: Secondary | ICD-10-CM

## 2017-12-22 NOTE — Patient Instructions (Signed)
Medication Instructions:  The current medical regimen is effective;  continue present plan and medications.  Follow-Up: Follow up in 1 year with Dr. Percival Spanish.  You will receive a letter in the mail 2 months before you are due.  Please call us when you receive this letter to schedule your follow up appointment.  Please bring a list of your most recent blood pressures for Dr Percival Spanish to review.  If you need a refill on your cardiac medications before your next appointment, please call your pharmacy.  Thank you for choosing [facility]!!

## 2017-12-28 DIAGNOSIS — B351 Tinea unguium: Secondary | ICD-10-CM | POA: Diagnosis not present

## 2017-12-28 DIAGNOSIS — M79676 Pain in unspecified toe(s): Secondary | ICD-10-CM | POA: Diagnosis not present

## 2017-12-28 DIAGNOSIS — E1142 Type 2 diabetes mellitus with diabetic polyneuropathy: Secondary | ICD-10-CM | POA: Diagnosis not present

## 2017-12-28 DIAGNOSIS — L84 Corns and callosities: Secondary | ICD-10-CM | POA: Diagnosis not present

## 2017-12-30 ENCOUNTER — Encounter: Payer: Self-pay | Admitting: Family Medicine

## 2017-12-30 ENCOUNTER — Ambulatory Visit (INDEPENDENT_AMBULATORY_CARE_PROVIDER_SITE_OTHER): Payer: Medicare Other | Admitting: Family Medicine

## 2017-12-30 VITALS — BP 164/73 | HR 56 | Temp 98.0°F | Ht 66.0 in | Wt 151.0 lb

## 2017-12-30 DIAGNOSIS — I10 Essential (primary) hypertension: Secondary | ICD-10-CM | POA: Diagnosis not present

## 2017-12-30 DIAGNOSIS — K649 Unspecified hemorrhoids: Secondary | ICD-10-CM | POA: Diagnosis not present

## 2017-12-30 DIAGNOSIS — Z794 Long term (current) use of insulin: Secondary | ICD-10-CM

## 2017-12-30 DIAGNOSIS — F411 Generalized anxiety disorder: Secondary | ICD-10-CM | POA: Diagnosis not present

## 2017-12-30 DIAGNOSIS — E785 Hyperlipidemia, unspecified: Secondary | ICD-10-CM

## 2017-12-30 DIAGNOSIS — E119 Type 2 diabetes mellitus without complications: Secondary | ICD-10-CM | POA: Diagnosis not present

## 2017-12-30 MED ORDER — PAROXETINE HCL 20 MG PO TABS: ORAL_TABLET | ORAL | 3 refills | Status: DC

## 2017-12-30 MED ORDER — INSULIN LISPRO 100 UNIT/ML (KWIKPEN): 4.0000 [IU] | PEN_INJECTOR | Freq: Every day | SUBCUTANEOUS | 5 refills | Status: DC | PRN

## 2017-12-30 MED ORDER — ATORVASTATIN CALCIUM 20 MG PO TABS: 20.0000 mg | ORAL_TABLET | Freq: Every day | ORAL | 3 refills | Status: DC

## 2017-12-30 MED ORDER — FUROSEMIDE 20 MG PO TABS: 20.0000 mg | ORAL_TABLET | Freq: Every day | ORAL | 3 refills | Status: DC | PRN

## 2017-12-30 MED ORDER — "INSULIN SYRINGE-NEEDLE U-100 31G X 5/16"" 1 ML MISC": 2 refills | Status: DC

## 2017-12-30 MED ORDER — INSULIN DETEMIR 100 UNIT/ML FLEXPEN: 22.0000 [IU] | PEN_INJECTOR | Freq: Every day | SUBCUTANEOUS | 2 refills | Status: DC

## 2017-12-30 MED ORDER — HYDROCORTISONE ACETATE 25 MG RE SUPP: 25.0000 mg | Freq: Two times a day (BID) | RECTAL | 0 refills | Status: DC

## 2017-12-30 MED ORDER — METFORMIN HCL ER 500 MG PO TB24: ORAL_TABLET | ORAL | 5 refills | Status: DC

## 2017-12-30 MED ORDER — AMLODIPINE BESYLATE 10 MG PO TABS: ORAL_TABLET | ORAL | 3 refills | Status: DC

## 2017-12-30 MED ORDER — INSULIN PEN NEEDLE 31G X 8 MM MISC: 2 refills | Status: DC

## 2017-12-30 MED ORDER — METOPROLOL TARTRATE 50 MG PO TABS: 50.0000 mg | ORAL_TABLET | Freq: Two times a day (BID) | ORAL | 5 refills | Status: DC

## 2017-12-30 NOTE — Progress Notes (Addendum)
HPI  Patient presents today here to follow-up for chronic medical conditions and discuss hemorrhoids.  Patient states she has had off-and-on issues with hemorrhoids for about 1 year.  She states that she does have constipation, her family states that she has not tried any fiber supplements consistently. Metamucil did not work previously however they did not use many doses.  Diabetes Medication compliance is improving, no hypoglycemia.  Hypertension Patient having difficulty with medication compliance, they cite their recent discussion with cardiology and states there doing much better.   PMH: Smoking status noted ROS: Per HPI  Objective: BP (!) 164/73   Pulse (!) 56   Temp 98 F (36.7 C) (Oral)   Ht 5\' 6"  (1.676 m)   Wt 151 lb (68.5 kg)   BMI 24.37 kg/m  Gen: NAD, alert, cooperative with exam HEENT: NCAT CV: RRR, 6-3/8  systolic murmur Resp: CTABL, no wheezes, non-labored Ext: No edema, warm Neuro: Alert and oriented, No gross deficits  Assessment and plan:  #Hemorrhoids Characteristic pain, no exam today Trial of Anusol suppository If persistent will need to do an exam and consider referral to general surgery  #Hypertension Elevated, however they are keeping a close log and planning to follow-up with cardiology as instructed No change in meds for now  #Hyperlipidemia Previous LDL at goal in October, refilled Lipitor today No changes  #Type 2 diabetes Barely controlled with A1c of 8.0 in December, discussed improving compliance.   Anxiety Refill Paxil, stable Patient also taking Ativan 2-3 times daily, recommended decreasing to 1/2 tablet  Meds ordered this encounter  Medications  . metoprolol tartrate (LOPRESSOR) 50 MG tablet    Sig: Take 1 tablet (50 mg total) by mouth 2 (two) times daily.    Dispense:  60 tablet    Refill:  5  . furosemide (LASIX) 20 MG tablet    Sig: Take 1 tablet (20 mg total) by mouth daily as needed for fluid or edema.   Dispense:  90 tablet    Refill:  3  . PARoxetine (PAXIL) 20 MG tablet    Sig: TAKE (1) TABLET BY MOUTH ONCE DAILY.    Dispense:  90 tablet    Refill:  3  . atorvastatin (LIPITOR) 20 MG tablet    Sig: Take 1 tablet (20 mg total) by mouth at bedtime.    Dispense:  90 tablet    Refill:  3  . metFORMIN (GLUCOPHAGE-XR) 500 MG 24 hr tablet    Sig: TAKE 1 TABLET WITH BREAKFAST AND 1 TABLET WITH SUPPER.    Dispense:  60 tablet    Refill:  5  . amLODipine (NORVASC) 10 MG tablet    Sig: TAKE (1) TABLET BY MOUTH ONCE DAILY.    Dispense:  90 tablet    Refill:  3  . Insulin Pen Needle (GLOBAL EASE INJECT PEN NEEDLES) 31G X 8 MM MISC    Sig: AS DIRECTED    Dispense:  100 each    Refill:  2    E11.9  . Insulin Syringe-Needle U-100 (EASY TOUCH INSULIN SYRINGE) 31G X 5/16" 1 ML MISC    Sig: USE AS DIRECTED.    Dispense:  80 each    Refill:  2    E11.9  . insulin lispro (HUMALOG KWIKPEN) 100 UNIT/ML KiwkPen    Sig: Inject 0.04-0.1 mLs (4-10 Units total) into the skin daily as needed (high blood sugar).    Dispense:  15 mL    Refill:  5  .  Insulin Detemir (LEVEMIR FLEXTOUCH) 100 UNIT/ML Pen    Sig: Inject 22-28 Units into the skin daily.    Dispense:  15 mL    Refill:  2  . hydrocortisone (ANUSOL-HC) 25 MG suppository    Sig: Place 1 suppository (25 mg total) rectally 2 (two) times daily.    Dispense:  12 suppository    Refill:  [place], MD [facility] 12/30/2017, 8:28 AM

## 2017-12-30 NOTE — Patient Instructions (Signed)
Great to see you!  Please try 1/2 tablet of ativan instead of the whole tablet as this can cause dizziness and falls.

## 2018-01-03 ENCOUNTER — Other Ambulatory Visit: Payer: Self-pay

## 2018-01-03 DIAGNOSIS — I25709 Atherosclerosis of coronary artery bypass graft(s), unspecified, with unspecified angina pectoris: Secondary | ICD-10-CM

## 2018-01-03 MED ORDER — ISOSORBIDE MONONITRATE ER 30 MG PO TB24: ORAL_TABLET | ORAL | 0 refills | Status: DC

## 2018-01-17 ENCOUNTER — Other Ambulatory Visit: Payer: Self-pay | Admitting: Family Medicine

## 2018-01-17 DIAGNOSIS — E119 Type 2 diabetes mellitus without complications: Secondary | ICD-10-CM | POA: Diagnosis not present

## 2018-01-17 MED ORDER — GLUCOSE BLOOD VI STRP: ORAL_STRIP | 3 refills | Status: DC

## 2018-01-17 MED ORDER — FORA LANCETS MISC: 3 refills | Status: DC

## 2018-01-31 ENCOUNTER — Other Ambulatory Visit: Payer: Self-pay | Admitting: *Deleted

## 2018-01-31 ENCOUNTER — Other Ambulatory Visit: Payer: Self-pay | Admitting: Family Medicine

## 2018-01-31 DIAGNOSIS — F411 Generalized anxiety disorder: Secondary | ICD-10-CM

## 2018-01-31 MED ORDER — LORAZEPAM 0.5 MG PO TABS: ORAL_TABLET | ORAL | 2 refills | Status: DC

## 2018-01-31 NOTE — Telephone Encounter (Signed)
Last seen 12/30/17  Dr Wendi Snipes

## 2018-02-02 ENCOUNTER — Other Ambulatory Visit: Payer: Self-pay | Admitting: Family Medicine

## 2018-02-11 ENCOUNTER — Encounter: Payer: Self-pay | Admitting: *Deleted

## 2018-03-04 ENCOUNTER — Ambulatory Visit (INDEPENDENT_AMBULATORY_CARE_PROVIDER_SITE_OTHER): Payer: Medicare Other

## 2018-03-04 ENCOUNTER — Encounter: Payer: Self-pay | Admitting: Family Medicine

## 2018-03-04 ENCOUNTER — Ambulatory Visit (INDEPENDENT_AMBULATORY_CARE_PROVIDER_SITE_OTHER): Payer: Medicare Other | Admitting: Family Medicine

## 2018-03-04 VITALS — BP 97/57 | HR 61 | Temp 98.2°F

## 2018-03-04 DIAGNOSIS — S51811A Laceration without foreign body of right forearm, initial encounter: Secondary | ICD-10-CM

## 2018-03-04 DIAGNOSIS — M25531 Pain in right wrist: Secondary | ICD-10-CM

## 2018-03-04 DIAGNOSIS — S6991XA Unspecified injury of right wrist, hand and finger(s), initial encounter: Secondary | ICD-10-CM | POA: Diagnosis not present

## 2018-03-04 MED ORDER — MUPIROCIN 2 % EX OINT: 1.0000 "application " | TOPICAL_OINTMENT | Freq: Two times a day (BID) | CUTANEOUS | 0 refills | Status: DC

## 2018-03-04 NOTE — Patient Instructions (Signed)
Great to see you!  Change the  bandage twice daily and apply mupirocin ointment.

## 2018-03-04 NOTE — Progress Notes (Signed)
   HPI  Patient presents today with right wrist pain.  Patient explains that she was outside trying to regular wall and from sitting position.  The stool that she was sitting in fell over and she landed on her right wrist.  She states that her right wrist is hurting but not more than expected.  She has a skin tear.  PMH: Smoking status noted ROS: Per HPI  Objective: BP (!) 97/57 (BP Location: Left Arm, Patient Position: Sitting, Cuff Size: Normal)   Pulse 61   Temp 98.2 F (36.8 C) (Oral)  Gen: NAD, alert, cooperative with exam HEENT: NCAT CV: RRR, good S1/S2, no murmur Resp: CTABL, no wheezes, non-labored Ext: No edema, warm Neuro: Alert and oriented, No gross deficits Skin 3 areas of skin tear, superficial  Assessment and plan:  #Right wrist pain, skin tear Patient with R wrist pain without major injury, however appears to be doing pretty well with it. No bony abnormalities on x-ray, radiology read pending. Discussed supportive care for skin tear, mupirocin ointment and bandage applied today. Low threshold for return if symptoms worsen or do not improve as expected.   Orders Placed This Encounter  Procedures  . DG Wrist Complete Right    Order Specific Question:   Reason for Exam (SYMPTOM  OR DIAGNOSIS REQUIRED)    Answer:   wrist injury rom fall    Order Specific Question:   Preferred imaging location?    Answer:   Internal    Order Specific Question:   Radiology Contrast Protocol - do NOT remove file path    Answer:   [email or web address]    Meds ordered this encounter  Medications  . mupirocin ointment (BACTROBAN) 2 %    Sig: Apply 1 application topically 2 (two) times daily.    Dispense:  22 g    Refill:  [place], MD [facility] 03/04/2018, 4:11 PM

## 2018-03-06 ENCOUNTER — Other Ambulatory Visit: Payer: Self-pay | Admitting: Family Medicine

## 2018-03-06 DIAGNOSIS — Z794 Long term (current) use of insulin: Principal | ICD-10-CM

## 2018-03-06 DIAGNOSIS — E119 Type 2 diabetes mellitus without complications: Secondary | ICD-10-CM

## 2018-03-06 DIAGNOSIS — I10 Essential (primary) hypertension: Secondary | ICD-10-CM

## 2018-03-08 DIAGNOSIS — E1142 Type 2 diabetes mellitus with diabetic polyneuropathy: Secondary | ICD-10-CM | POA: Diagnosis not present

## 2018-03-08 DIAGNOSIS — M79676 Pain in unspecified toe(s): Secondary | ICD-10-CM | POA: Diagnosis not present

## 2018-03-08 DIAGNOSIS — B351 Tinea unguium: Secondary | ICD-10-CM | POA: Diagnosis not present

## 2018-03-08 DIAGNOSIS — L84 Corns and callosities: Secondary | ICD-10-CM | POA: Diagnosis not present

## 2018-04-01 ENCOUNTER — Ambulatory Visit: Payer: Medicare Other | Admitting: Family Medicine

## 2018-04-05 ENCOUNTER — Ambulatory Visit (INDEPENDENT_AMBULATORY_CARE_PROVIDER_SITE_OTHER): Payer: Medicare Other | Admitting: Family Medicine

## 2018-04-05 ENCOUNTER — Encounter: Payer: Self-pay | Admitting: Family Medicine

## 2018-04-05 VITALS — BP 168/77 | HR 57 | Temp 97.1°F | Ht 66.0 in | Wt 148.2 lb

## 2018-04-05 DIAGNOSIS — E119 Type 2 diabetes mellitus without complications: Secondary | ICD-10-CM | POA: Diagnosis not present

## 2018-04-05 DIAGNOSIS — K219 Gastro-esophageal reflux disease without esophagitis: Secondary | ICD-10-CM | POA: Diagnosis not present

## 2018-04-05 DIAGNOSIS — K649 Unspecified hemorrhoids: Secondary | ICD-10-CM

## 2018-04-05 DIAGNOSIS — F411 Generalized anxiety disorder: Secondary | ICD-10-CM | POA: Diagnosis not present

## 2018-04-05 DIAGNOSIS — Z794 Long term (current) use of insulin: Secondary | ICD-10-CM

## 2018-04-05 LAB — CMP14+EGFR
ALBUMIN: 4 g/dL (ref 3.5–4.7)
ALT: 9 IU/L (ref 0–32)
AST: 12 IU/L (ref 0–40)
Albumin/Globulin Ratio: 1.5 (ref 1.2–2.2)
Alkaline Phosphatase: 45 IU/L (ref 39–117)
BUN / CREAT RATIO: 24 (ref 12–28)
BUN: 28 mg/dL — ABNORMAL HIGH (ref 8–27)
Bilirubin Total: 0.3 mg/dL (ref 0.0–1.2)
CALCIUM: 9.7 mg/dL (ref 8.7–10.3)
CO2: 21 mmol/L (ref 20–29)
CREATININE: 1.18 mg/dL — AB (ref 0.57–1.00)
Chloride: 104 mmol/L (ref 96–106)
GFR calc Af Amer: 49 mL/min/{1.73_m2} — ABNORMAL LOW (ref >59)
GFR, EST NON AFRICAN AMERICAN: 42 mL/min/{1.73_m2} — AB (ref >59)
GLOBULIN, TOTAL: 2.6 g/dL (ref 1.5–4.5)
Glucose: 207 mg/dL — ABNORMAL HIGH (ref 65–99)
Potassium: 4.2 mmol/L (ref 3.5–5.2)
SODIUM: 140 mmol/L (ref 134–144)
Total Protein: 6.6 g/dL (ref 6.0–8.5)

## 2018-04-05 LAB — BAYER DCA HB A1C WAIVED: HB A1C (BAYER DCA - WAIVED): 8 % — ABNORMAL HIGH (ref <7.0)

## 2018-04-05 MED ORDER — ISOSORBIDE MONONITRATE ER 30 MG PO TB24: 30.0000 mg | ORAL_TABLET | Freq: Every day | ORAL | 3 refills | Status: DC

## 2018-04-05 MED ORDER — ESOMEPRAZOLE MAGNESIUM 40 MG PO CPDR: 40.0000 mg | DELAYED_RELEASE_CAPSULE | Freq: Every day | ORAL | 3 refills | Status: DC

## 2018-04-05 MED ORDER — PAROXETINE HCL 20 MG PO TABS: ORAL_TABLET | ORAL | 3 refills | Status: DC

## 2018-04-05 MED ORDER — LISINOPRIL 20 MG PO TABS: ORAL_TABLET | ORAL | 3 refills | Status: DC

## 2018-04-05 NOTE — Progress Notes (Signed)
   HPI  Patient presents today here for follow-up chronic medical conditions.  GERD Doing well, needs refill of Protonix.  Anxiety Using Paxil plus Ativan, needs refill of paroxetine  Type 2 diabetes Not consistently checking blood sugars Watching diet moderately. Good medication compliance with 24 units of Levemir, rarely using Humalog, only using for hyperglycemia.  PMH: Smoking status noted ROS: Per HPI  Objective: BP (!) 168/77   Pulse (!) 57   Temp (!) 97.1 F (36.2 C) (Oral)   Ht '5\' 6"'$  (1.676 m)   Wt 148 lb 3.2 oz (67.2 kg)   BMI 23.92 kg/m  Gen: NAD, alert, cooperative with exam HEENT: NCAT, protruding tongue CV: RRR, good Z7/B5, 3/6 systolic murmur Resp: CTABL, no wheezes, non-labored Ext: No edema, warm Neuro: Alert and oriented, No gross deficits Msk:  No TTP of paraspinal muscles or midline lumbar spine.   Assessment and plan:  #Type 2 diabetes Previously borderline control No changes to medication regimen at this time, continue metformin cautiously CMP Continue Levemir, Humalog only as needed.   #GAD Refill Paxil, stable Continue Ativan as well, we refilled this 2 months ago.  #GERD Doing well with PPI, refill  #Hemorrhoids Discussed suppositories, I do not believe they filled these. She is using an over-the-counter medication that is working well, discussed other options as well over-the-counter  Hypertension Patient's blood pressure has been fluctuating widely lately, she was systolic 97 last visit now 168, no changes for now, however monitor closely    Orders Placed This Encounter  Procedures  . Microalbumin / creatinine urine ratio  . Bayer DCA Hb A1c Waived  . CMP14+EGFR    Meds ordered this encounter  Medications  . PARoxetine (PAXIL) 20 MG tablet    Sig: TAKE (1) TABLET BY MOUTH ONCE DAILY.    Dispense:  90 tablet    Refill:  3  . esomeprazole (NEXIUM) 40 MG capsule    Sig: Take 1 capsule (40 mg total) by mouth daily.   Dispense:  90 capsule    Refill:  3  . isosorbide mononitrate (IMDUR) 30 MG 24 hr tablet    Sig: Take 1 tablet (30 mg total) by mouth daily.    Dispense:  90 tablet    Refill:  3  . lisinopril (PRINIVIL,ZESTRIL) 20 MG tablet    Sig: TAKE (1) TABLET BY MOUTH ONCE DAILY.    Dispense:  90 tablet    Refill:  [place], MD [facility] 04/05/2018, 9:00 AM

## 2018-04-06 ENCOUNTER — Other Ambulatory Visit: Payer: Self-pay | Admitting: Family Medicine

## 2018-04-06 DIAGNOSIS — I10 Essential (primary) hypertension: Secondary | ICD-10-CM

## 2018-04-06 DIAGNOSIS — Z794 Long term (current) use of insulin: Principal | ICD-10-CM

## 2018-04-06 DIAGNOSIS — E119 Type 2 diabetes mellitus without complications: Secondary | ICD-10-CM

## 2018-04-06 DIAGNOSIS — K219 Gastro-esophageal reflux disease without esophagitis: Secondary | ICD-10-CM

## 2018-04-06 LAB — MICROALBUMIN / CREATININE URINE RATIO
Creatinine, Urine: 69.6 mg/dL
MICROALB/CREAT RATIO: 1427 mg/g{creat} — AB (ref 0.0–30.0)
Microalbumin, Urine: 993.2 ug/mL

## 2018-04-07 ENCOUNTER — Other Ambulatory Visit: Payer: Self-pay | Admitting: Family Medicine

## 2018-04-21 ENCOUNTER — Ambulatory Visit (INDEPENDENT_AMBULATORY_CARE_PROVIDER_SITE_OTHER): Payer: Medicare Other | Admitting: *Deleted

## 2018-04-21 ENCOUNTER — Encounter: Payer: Self-pay | Admitting: *Deleted

## 2018-04-21 VITALS — BP 165/90 | HR 75 | Ht 66.0 in | Wt 146.0 lb

## 2018-04-21 DIAGNOSIS — Z0000 Encounter for general adult medical examination without abnormal findings: Secondary | ICD-10-CM

## 2018-04-21 MED ORDER — METHENAMINE HIPPURATE 1 G PO TABS: 1.0000 g | ORAL_TABLET | Freq: Two times a day (BID) | ORAL | 0 refills | Status: DC

## 2018-04-21 MED ORDER — BLOOD GLUCOSE MONITOR KIT: PACK | 0 refills | Status: DC

## 2018-04-21 NOTE — Progress Notes (Addendum)
Subjective:   [name] is a 84 y.o. female who presents for an Initial Medicare Annual Wellness Visit. [name] is accompanied today by her daughter [name] who lives with her. Her husband passed away suddenly about 30 years ago at the age of 43. She has three adult daughters and had one son but he died at 8 from an MI. She has several grandchildren.   Review of Systems    States that her health is a little worse than last year.   Complains of weight loss over the past 6 months.   GI: Diffuse lower abd pain, chronic constipation, rectal pain, and some bleeding with bowel movements. Has diagnosed hemorrhoids and feels this is the problem. She and her family are concerned about having a diagnostic colonoscopy due to age. She was unable to tolerate Linzess. Has used warm prune juice with minimal results.   Cardiac Risk Factors include: advanced age (>53mn, >>44women);diabetes mellitus;dyslipidemia;hypertension;sedentary lifestyle;family history of premature cardiovascular disease     Objective:    Today's Vitals   04/21/18 0844 04/21/18 0846 04/21/18 0915  BP: (!) 198/79  (!) 165/90  Pulse: (!) 58  75  Weight: 146 lb (66.2 kg)    Height: _0  (1.676 m)    PainSc:  5     Body mass index is 23.57 kg/m.  Advanced Directives 04/21/2018 06/07/2017 04/20/2017 02/26/2017 02/19/2017 01/22/2017 01/18/2017  Does Patient Have a Medical Advance Directive? No _1  Yes  Type of Advance Directive - HCandelero AbajoLiving will [place]Living will [place]wyn HeightsLiving will - Healthcare Power of [place]Living will  Does patient want to make changes to medical advance directive? - No - Patient declined No - Patient declined Yes (Inpatient - patient requests chaplain consult to change a medical advance directive) - - No - Patient declined  Copy of H[place]evesin Chart? - No - copy requested No  - copy requested No - copy requested - Yes No - copy requested  Would patient like information on creating a medical advance directive? No - Patient declined - - - - - -    Current Medications (verified) Outpatient Encounter Medications as of 04/21/2018  Medication Sig  . Alendronate Sodium (BINOSTO) 70 MG TBEF Mix 1 effervescent tablet with at least 4 ounces of water and allow to dissolve completely (about 10 min). Then drink entire mixture once a WEEK.  Drink prior to any food and do not eat, drink anything other than water or lie down for 30 minutes after drinking mixture.  .Marland KitchenamLODipine (NORVASC) 10 MG tablet TAKE (1) TABLET BY MOUTH ONCE DAILY.  .Marland KitchenArtificial Tear Ointment (DRY EYES OP) Apply 1 drop to eye daily as needed (dry/red eyes).   .Marland Kitchenaspirin 81 MG chewable tablet Chew 81 mg by mouth at bedtime.  .Marland Kitchenatorvastatin (LIPITOR) 20 MG tablet Take 1 tablet (20 mg total) by mouth at bedtime.  . clopidogrel (PLAVIX) 75 MG tablet TAKE (1) TABLET BY MOUTH ONCE DAILY.  .Marland Kitchenesomeprazole (NEXIUM) 40 MG capsule TAKE 1 CAPSULE BY MOUTH ONCE DAILY.  . ferrous sulfate 325 (65 FE) MG tablet TAKE 1 TABLET 3 TIMES A DAY AFTER MEALS. (Patient taking differently: Take one tablet qhs)  . FORA LANCETS MISC Test blood sugars twice daily  . furosemide (LASIX) 20 MG tablet Take 1 tablet (20 mg total) by mouth daily as needed for fluid or edema.  .Marland Kitchen  glucose blood (ACCU-CHEK AVIVA) test strip Use to check blood glucose twice a day  . hydrocortisone (ANUSOL-HC) 25 MG suppository Place 1 suppository (25 mg total) rectally 2 (two) times daily.  . Insulin Detemir (LEVEMIR FLEXTOUCH) 100 UNIT/ML Pen Inject 22-28 Units into the skin daily.  . insulin lispro (HUMALOG KWIKPEN) 100 UNIT/ML KiwkPen Inject 0.04-0.1 mLs (4-10 Units total) into the skin daily as needed (high blood sugar).  . Insulin Pen Needle (GLOBAL EASE INJECT PEN NEEDLES) 31G X 8 MM MISC AS DIRECTED  . Insulin Pen Needle 31G X 5 MM MISC Use with  Levemir  injection  . Insulin Syringe-Needle U-100 (EASY TOUCH INSULIN SYRINGE) 31G X 5/16" 1 ML MISC USE AS DIRECTED.  Marland Kitchen isosorbide mononitrate (IMDUR) 30 MG 24 hr tablet Take 1 tablet (30 mg total) by mouth daily.  Marland Kitchen lisinopril (PRINIVIL,ZESTRIL) 20 MG tablet TAKE (1) TABLET BY MOUTH ONCE DAILY.  Marland Kitchen loratadine (CLARITIN) 10 MG tablet Take 1 tablet (10 mg total) by mouth daily.  Marland Kitchen LORazepam (ATIVAN) 0.5 MG tablet TAKE (1) TABLET TWICE DAILY as needed for anxiety  . meclizine (ANTIVERT) 25 MG tablet TAKE 1 TABLET TWICE DAILY AS NEEDED FOR DIZZINESS.  . metFORMIN (GLUCOPHAGE-XR) 500 MG 24 hr tablet TAKE 1 TABLET WITH BREAKFAST AND 1 TABLET WITH SUPPER.  . methenamine (HIPREX) 1 g tablet Take 1 tablet (1 g total) by mouth 2 (two) times daily.  . metoprolol tartrate (LOPRESSOR) 50 MG tablet Take 1 tablet (50 mg total) by mouth 2 (two) times daily.  . Multiple Vitamins-Iron (DAILY VITAMINS/IRON/BETA CAROT PO) Take 1 tablet by mouth at bedtime.  . mupirocin ointment (BACTROBAN) 2 % Apply 1 application topically 2 (two) times daily.  . nitroGLYCERIN (NITROSTAT) 0.4 MG SL tablet PLACE ONE (1) TABLET UNDER TONGUE EVERY 5 MINUTES UP TO (3) DOSES AS NEEDED FOR CHEST PAIN.  Marland Kitchen PARoxetine (PAXIL) 20 MG tablet TAKE (1) TABLET BY MOUTH ONCE DAILY.  . [DISCONTINUED] methenamine (HIPREX) 1 g tablet Take 1 tablet by mouth 2 (two) times daily.  . blood glucose meter kit and supplies KIT Dispense based on patient and insurance preference. Use up to four times daily as directed. (FOR ICD-9 250.00, 250.01).  Marland Kitchen trimethoprim (TRIMPEX) 100 MG tablet Take 100 mg by mouth daily.   No facility-administered encounter medications on file as of 04/21/2018.     Allergies (verified) Advil [ibuprofen]; Heparin; and Propoxyphene n-acetaminophen   History: Past Medical History:  Diagnosis Date  . Anemia   . Anxiety   . Arthritis    back, knees, and hips  . Asthma    with allergies  . Balance problems   . CAD (coronary artery  disease)   . Carotid stenosis   . CHF (congestive heart failure) ([place])    EF preserved Echo 2012  . 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 Dementia    "a little"  . Depression   . Diabetes mellitus    type 2  . GERD (gastroesophageal reflux disease)   . Hearing loss   . Hypertension   . Iron deficiency anemia 05/27/2011  . Myocardial infarction ([place])   . Renal insufficiency   . Shortness of breath dyspnea    with activity  . Vertigo    "when sugar gets low"  . Vision loss    left eye-"half blind"   Past Surgical History:  Procedure Laterality Date  . APPENDECTOMY  with hysterectomy  . CATARACT EXTRACTION Bilateral 5 years ago  . CHOLECYSTECTOMY    . CORONARY ANGIOPLASTY     prior to 2006 5 stents  . CORONARY ARTERY BYPASS GRAFT  2006  . I & D of [place]  April 2013  . KNEE SURGERY Right   . TOTAL ABDOMINAL HYSTERECTOMY  ~84 years old   complete, with tumor removal  . TOTAL KNEE ARTHROPLASTY Right 08/13/2015   Procedure: RIGHT  TOTAL KNEE ARTHROPLASTY;  Surgeon: Paralee Cancel, MD;  Location: [facility];  Service: Orthopedics;  Laterality: Right;   Family History  Problem Relation Age of Onset  . Cancer Brother        porstate  . Early death Sister   . Heart disease Brother   . Heart disease Brother   . Heart attack Brother   . Heart disease Brother   . Heart attack Brother   . Diabetes Sister   . Heart attack Sister   . Diabetes Sister   . Osteoporosis Sister   . Hypertension Sister   . Heart attack Son   . Early death Son   . Pancreatitis Son   . Heart attack Daughter 43   Social History   Socioeconomic History  . Marital status: Widowed    Spouse name: Not on file  . Number of children: 4  . Years of education: 3  . Highest education level: 3rd grade  Occupational History  . Occupation: retired  Scientific laboratory technician  . Financial resource strain: Not very hard  . Food  insecurity:    Worry: Never true    Inability: Never true  . Transportation needs:    Medical: No    Non-medical: No  Tobacco Use  . Smoking status: Never Smoker  . Smokeless tobacco: Never Used  Substance and Sexual Activity  . Alcohol use: No  . Drug use: No  . Sexual activity: Not Currently  Lifestyle  . Physical activity:    Days per week: 0 days    Minutes per session: 0 min  . Stress: To some extent  Relationships  . Social connections:    Talks on phone: More than three times a week    Gets together: More than three times a week    Attends religious service: More than 4 times per year    Active member of club or organization: No    Attends meetings of clubs or organizations: Never    Relationship status: Widowed  Other Topics Concern  . Not on file  Social History Narrative  . Not on file    Tobacco Counseling Counseling given: Not Answered   Clinical Intake:     Pain : 0-10 Pain Score: 5  Pain Type: Acute pain Pain Location: Rectum Pain Descriptors / Indicators: Throbbing, Burning Pain Onset: More than a month ago Pain Frequency: Intermittent     Nutritional Status: BMI of 19-24  Normal Diabetes: No CBG done?: No Did pt. bring in CBG monitor from home?: No  How often do you need to have someone help you when you read instructions, pamphlets, or other written materials from your doctor or pharmacy?: 4 - Often What is the last grade level you completed in school?: 3rd  Interpreter Needed?: No  Information entered by :: Chong Sicilian, RN   Activities of Daily Living In your present state of health, do you have any difficulty performing the following activities: 04/21/2018 06/07/2017  Hearing? Y Y  Comment - Some difficulty if someone is  not talking directly to her  Vision? N Y  Comment yearly eye exam Some problems with left eye due to stroke  Difficulty concentrating or making decisions? N Y  Comment has had some minor short term memory issues  since having her stroke 3 years ago some problems with short term memory  Walking or climbing stairs? Tempie Donning  Comment uses a walker for walking. There is a one step difference in elevation in their home. She has a railing.  -  Dressing or bathing? Y N  Doing errands, shopping? Tempie Donning  Preparing Food and eating ? N Y  Using the Toilet? N N  In the past six months, have you accidently leaked urine? Y Y  Do you have problems with loss of bowel control? N Y  Managing your Medications? Benay Pike - daughter manages  Managing your Finances? Tempie Donning  Housekeeping or managing your Housekeeping? Y Y  Some recent data might be hidden     Immunizations and Health Maintenance  There is no immunization history on file for this patient. Health Maintenance Due  Topic Date Due  . OPHTHALMOLOGY EXAM  05/19/2017  . FOOT EXAM  09/04/2017    Patient Care Team: Timmothy Euler, MD as PCP - General (Family Medicine) Minus Breeding, MD as PCP - Cardiology (Cardiology) Steffanie Rainwater, DPM as Consulting Physician (Podiatry) Williams Che, MD (Inactive) as Consulting Physician (Ophthalmology)  Indicate any recent Medical Services you may have received from other than [facility] providers in the past year (date may be approximate).     Assessment:   This is a routine wellness examination for [place].  Hearing/Vision screen No deficits noted during visit.   Dietary issues and exercise activities discussed: Current Exercise Habits: The patient does not participate in regular exercise at present, Exercise limited by: orthopedic condition(s)  Goals    . Prevent falls     Move carefully to avoid falls. Use your walker.       Depression Screen PHQ 2/9 Scores 04/21/2018 04/05/2018 12/30/2017 11/23/2017 09/28/2017 06/16/2017 06/07/2017  PHQ - 2 Score 2 0 0 _0 PHQ- 9 Score 5 - - - _1 Exception Documentation - - - - - - -  Not completed - - - - - - -    Fall Risk Fall Risk  04/21/2018 04/05/2018  12/30/2017 11/23/2017 09/28/2017  Falls in the past year? Yes No Yes Yes Yes  Comment - - - - -  Number falls in past yr: 1 - 2 or more 2 or more -  Injury with Fall? No - No No -  Comment Minor right arm abrasion - - - -  Risk Factor Category  High Fall Risk - - - -  Risk for fall due to : History of fall(s) - - - -  Follow up Falls prevention discussed - - - -  Comment - - - - -    Is the patient's home free of loose throw rugs in walkways, pet beds, electrical cords, etc?   yes      Grab bars in the bathroom? no      Handrails on the stairs?   yes      Adequate lighting?   yes    Cognitive Function: MMSE - Mini Mental State Exam 04/21/2018 04/20/2017 04/03/2016  Not completed: Unable to complete Unable to complete -  Orientation to time - 4 5  Orientation to Place - 4  5  Registration - 3 3  Attention/ Calculation - 0 5  Recall - 2 3  Language- name 2 objects - 2 2  Language- repeat - 1 1  Language- follow 3 step command - 0 (No Data)  Language- follow 3 step command-comments - - pt is unable to read  Language- read & follow direction - 0 -  Write a sentence - 0 -  Copy design - 0 -  Total score - 16 -        Screening Tests Health Maintenance  Topic Date Due  . OPHTHALMOLOGY EXAM  05/19/2017  . FOOT EXAM  09/04/2017  . INFLUENZA VACCINE  08/13/2018 (Originally 07/07/2018)  . TETANUS/TDAP  04/22/2019 (Originally 10/20/1950)  . PNA vac Low Risk Adult (1 of 2 - PCV13) 04/22/2019 (Originally 10/20/1998)  . MAMMOGRAM  07/05/2018  . HEMOGLOBIN A1C  10/05/2018  . DEXA SCAN  04/22/2019      Cancer Screenings: Lung: Low Dose CT Chest recommended if Age 76-80 years, 30 pack-year currently smoking OR have quit w/in 15years. Patient does not qualify. Breast: Up to date on Mammogram? yes Up to date of Bone Density/Dexa? Yes Colorectal: not indicated    Plan:  Docusate sodium 3 capsules a day Warm Sitz bath daily See provider Wear a left knee brace Appt scheduled to  evaluate hemorrhoids, constipation, abd pain, and weight loss. Requested female provider and will need to change providers since Dr Wendi Snipes is leaving the practice at the end of July. Scheduled with Particia Nearing, PA-C who she has seen before.   I have personally reviewed and noted the following in the patient's chart:   . Medical and social history . Use of alcohol, tobacco or illicit drugs  . Current medications and supplements . Functional ability and status . Nutritional status . Physical activity . Advanced directives . List of other physicians . Hospitalizations, surgeries, and ER visits in previous 12 months . Vitals . Screenings to include cognitive, depression, and falls . Referrals and appointments  In addition, I have reviewed and discussed with patient certain preventive protocols, quality metrics, and best practice recommendations. A written personalized care plan for preventive services as well as general preventive health recommendations were provided to patient.     Chong Sicilian, RN   04/22/2018    I have reviewed and agree with the above AWV documentation.   Laroy Apple, MD [facility] 05/06/2018, 11:39 AM

## 2018-04-21 NOTE — Patient Instructions (Signed)
  [name] , Thank you for taking time to come for your Medicare Wellness Visit. I appreciate your ongoing commitment to your health goals. Please review the following plan we discussed and let me know if I can assist you in the future.   These are the goals we discussed: Goals    . Prevent falls     Move carefully to avoid falls. Use your walker.        This is a list of the screening recommended for you and due dates:  Health Maintenance  Topic Date Due  . Eye exam for diabetics  05/19/2017  . Complete foot exam   09/04/2017  . Flu Shot  08/13/2018*  . Tetanus Vaccine  04/22/2019*  . Pneumonia vaccines (1 of 2 - PCV13) 04/22/2019*  . Mammogram  07/05/2018  . Hemoglobin A1C  10/05/2018  . DEXA scan (bone density measurement)  04/22/2019  *Topic was postponed. The date shown is not the original due date.

## 2018-04-26 ENCOUNTER — Ambulatory Visit (INDEPENDENT_AMBULATORY_CARE_PROVIDER_SITE_OTHER): Payer: Medicare Other | Admitting: Physician Assistant

## 2018-04-26 ENCOUNTER — Encounter: Payer: Self-pay | Admitting: Physician Assistant

## 2018-04-26 VITALS — BP 149/65 | HR 65 | Temp 96.8°F | Ht 66.0 in | Wt 145.6 lb

## 2018-04-26 DIAGNOSIS — K5900 Constipation, unspecified: Secondary | ICD-10-CM

## 2018-04-26 DIAGNOSIS — K649 Unspecified hemorrhoids: Secondary | ICD-10-CM

## 2018-04-26 DIAGNOSIS — L729 Follicular cyst of the skin and subcutaneous tissue, unspecified: Secondary | ICD-10-CM | POA: Diagnosis not present

## 2018-04-26 MED ORDER — HYDROCORTISONE ACETATE 25 MG RE SUPP: 25.0000 mg | Freq: Two times a day (BID) | RECTAL | 0 refills | Status: DC

## 2018-04-26 NOTE — Patient Instructions (Signed)
miralax 1/2 capful daily Okay to increase up to 2 cap

## 2018-04-27 NOTE — Progress Notes (Signed)
BP (!) 149/65   Pulse 65   Temp (!) 96.8 F (36 C) (Oral)   Ht 5' 6"  (1.676 m)   Wt 145 lb 9.6 oz (66 kg)   BMI 23.50 kg/m    Subjective:    Patient ID: [name], female    DOB: [birth year], 84 y.o.   MRN: [id number]  HPI: JOYANN SPIDLE is a 84 y.o. female presenting on 04/26/2018 for Hemorrhoids; Constipation; and Abdominal Pain (lower )  This patient comes in having chronic constipation, lower abdominal pain and hemorrhoids.  The hemorrhoids will flareup when she has more issues with her constipation.  She is currently not taking anything on a regular basis to help with her constipation.  There are few medicines that she takes that contain constipation risks.  She does have to take iron.  It does make her stools dark and more constipated.  She denies any fever or chills, mucus or pus in her bowels  Past Medical History:  Diagnosis Date  . Anemia   . Anxiety   . Arthritis    back, knees, and hips  . Asthma    with allergies  . Balance problems   . CAD (coronary artery disease)   . Carotid stenosis   . CHF (congestive heart failure) ([place])    EF preserved Echo 2012  . 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 Dementia    "a little"  . Depression   . Diabetes mellitus    type 2  . GERD (gastroesophageal reflux disease)   . Hearing loss   . Hypertension   . Iron deficiency anemia 05/27/2011  . Myocardial infarction ([place])   . Renal insufficiency   . Shortness of breath dyspnea    with activity  . Vertigo    "when sugar gets low"  . Vision loss    left eye-"half blind"   Relevant past medical, surgical, family and social history reviewed and updated as indicated. Interim medical history since our last visit reviewed. Allergies and medications reviewed and updated. DATA REVIEWED: CHART IN EPIC  Family History reviewed for pertinent findings.  Review of Systems    Constitutional: Negative.   HENT: Negative.   Eyes: Negative.   Respiratory: Negative.   Gastrointestinal: Positive for anal bleeding and constipation.  Genitourinary: Negative.     Allergies as of 04/26/2018      Reactions   Advil [ibuprofen] Swelling   Heparin Other (See Comments)   Confusion   Propoxyphene N-acetaminophen Other (See Comments)   Numbness all over. "floating" sensation.      Medication List        Accurate as of 04/26/18 11:59 PM. Always use your most recent med list.          Alendronate Sodium 70 MG Tbef Commonly known as:  BINOSTO Mix 1 effervescent tablet with at least 4 ounces of water and allow to dissolve completely (about 10 min). Then drink entire mixture once a WEEK.  Drink prior to any food and do not eat, drink anything other than water or lie down for 30 minutes after drinking mixture.   amLODipine 10 MG tablet Commonly known as:  NORVASC TAKE (1) TABLET BY MOUTH ONCE DAILY.   aspirin 81 MG chewable tablet Chew 81 mg by mouth at bedtime.   atorvastatin 20 MG tablet Commonly known as:  LIPITOR Take 1 tablet (20 mg  total) by mouth at bedtime.   blood glucose meter kit and supplies Kit Dispense based on patient and insurance preference. Use up to four times daily as directed. (FOR ICD-9 250.00, 250.01).   clopidogrel 75 MG tablet Commonly known as:  PLAVIX TAKE (1) TABLET BY MOUTH ONCE DAILY.   DAILY VITAMINS/IRON/BETA CAROT PO Take 1 tablet by mouth at bedtime.   DRY EYES OP Apply 1 drop to eye daily as needed (dry/red eyes).   esomeprazole 40 MG capsule Commonly known as:  NEXIUM TAKE 1 CAPSULE BY MOUTH ONCE DAILY.   ferrous sulfate 325 (65 FE) MG tablet TAKE 1 TABLET 3 TIMES A DAY AFTER MEALS.   FORA LANCETS Misc Test blood sugars twice daily   furosemide 20 MG tablet Commonly known as:  LASIX Take 1 tablet (20 mg total) by mouth daily as needed for fluid or edema.   glucose blood test strip Commonly known as:   ACCU-CHEK AVIVA Use to check blood glucose twice a day   hydrocortisone 25 MG suppository Commonly known as:  ANUSOL-HC Place 1 suppository (25 mg total) rectally 2 (two) times daily.   Insulin Detemir 100 UNIT/ML Pen Commonly known as:  LEVEMIR FLEXTOUCH Inject 22-28 Units into the skin daily.   insulin lispro 100 UNIT/ML KiwkPen Commonly known as:  HUMALOG KWIKPEN Inject 0.04-0.1 mLs (4-10 Units total) into the skin daily as needed (high blood sugar).   Insulin Pen Needle 31G X 5 MM Misc Use with  Levemir injection   Insulin Pen Needle 31G X 8 MM Misc Commonly known as:  GLOBAL EASE INJECT PEN NEEDLES AS DIRECTED   Insulin Syringe-Needle U-100 31G X 5/16" 1 ML Misc Commonly known as:  EASY TOUCH INSULIN SYRINGE USE AS DIRECTED.   isosorbide mononitrate 30 MG 24 hr tablet Commonly known as:  IMDUR Take 1 tablet (30 mg total) by mouth daily.   lisinopril 20 MG tablet Commonly known as:  PRINIVIL,ZESTRIL TAKE (1) TABLET BY MOUTH ONCE DAILY.   loratadine 10 MG tablet Commonly known as:  CLARITIN Take 1 tablet (10 mg total) by mouth daily.   LORazepam 0.5 MG tablet Commonly known as:  ATIVAN TAKE (1) TABLET TWICE DAILY as needed for anxiety   meclizine 25 MG tablet Commonly known as:  ANTIVERT TAKE 1 TABLET TWICE DAILY AS NEEDED FOR DIZZINESS.   metFORMIN 500 MG 24 hr tablet Commonly known as:  GLUCOPHAGE-XR TAKE 1 TABLET WITH BREAKFAST AND 1 TABLET WITH SUPPER.   methenamine 1 g tablet Commonly known as:  HIPREX Take 1 tablet (1 g total) by mouth 2 (two) times daily.   metoprolol tartrate 50 MG tablet Commonly known as:  LOPRESSOR Take 1 tablet (50 mg total) by mouth 2 (two) times daily.   mupirocin ointment 2 % Commonly known as:  BACTROBAN Apply 1 application topically 2 (two) times daily.   nitroGLYCERIN 0.4 MG SL tablet Commonly known as:  NITROSTAT PLACE ONE (1) TABLET UNDER TONGUE EVERY 5 MINUTES UP TO (3) DOSES AS NEEDED FOR CHEST PAIN.     PARoxetine 20 MG tablet Commonly known as:  PAXIL TAKE (1) TABLET BY MOUTH ONCE DAILY.   trimethoprim 100 MG tablet Commonly known as:  TRIMPEX Take 100 mg by mouth daily.          Objective:    BP (!) 149/65   Pulse 65   Temp (!) 96.8 F (36 C) (Oral)   Ht 5' 6"  (1.676 m)   Wt 145 lb 9.6 oz (  66 kg)   BMI 23.50 kg/m   Allergies  Allergen Reactions  . Advil [Ibuprofen] Swelling  . Heparin Other (See Comments)    Confusion  . Propoxyphene N-Acetaminophen Other (See Comments)    Numbness all over. "floating" sensation.    Wt Readings from Last 3 Encounters:  04/26/18 145 lb 9.6 oz (66 kg)  04/21/18 146 lb (66.2 kg)  04/05/18 148 lb 3.2 oz (67.2 kg)    Physical Exam  Constitutional: She is oriented to person, place, and time. She appears well-developed and well-nourished.  HENT:  Head: Normocephalic and atraumatic.  Eyes: Pupils are equal, round, and reactive to light. Conjunctivae and EOM are normal.  Cardiovascular: Normal rate, regular rhythm, normal heart sounds and intact distal pulses.  Pulmonary/Chest: Effort normal and breath sounds normal.  Abdominal: Soft. Bowel sounds are normal. There is no tenderness.  Neurological: She is alert and oriented to person, place, and time. She has normal reflexes.  Skin: Skin is warm and dry. Lesion noted. No rash noted.     Large sebaceous cyst with vitamin of the right earlobe  Psychiatric: She has a normal mood and affect. Her behavior is normal. Judgment and thought content normal.    Results for orders placed or performed in visit on 04/05/18  Microalbumin / creatinine urine ratio  Result Value Ref Range   Creatinine, Urine 69.6 Not Estab. mg/dL   Microalbumin, Urine 993.2 Not Estab. ug/mL   Microalb/Creat Ratio 1,427.0 (H) 0.0 - 30.0 mg/g creat  Bayer DCA Hb A1c Waived  Result Value Ref Range   HB A1C (BAYER DCA - WAIVED) 8.0 (H) <7.0 %  CMP14+EGFR  Result Value Ref Range   Glucose 207 (H) 65 - 99 mg/dL    BUN 28 (H) 8 - 27 mg/dL   Creatinine, Ser 1.18 (H) 0.57 - 1.00 mg/dL   GFR calc non Af Amer 42 (L) >59 mL/min/1.73   GFR calc Af Amer 49 (L) >59 mL/min/1.73   BUN/Creatinine Ratio 24 12 - 28   Sodium 140 134 - 144 mmol/L   Potassium 4.2 3.5 - 5.2 mmol/L   Chloride 104 96 - 106 mmol/L   CO2 21 20 - 29 mmol/L   Calcium 9.7 8.7 - 10.3 mg/dL   Total Protein 6.6 6.0 - 8.5 g/dL   Albumin 4.0 3.5 - 4.7 g/dL   Globulin, Total 2.6 1.5 - 4.5 g/dL   Albumin/Globulin Ratio 1.5 1.2 - 2.2   Bilirubin Total 0.3 0.0 - 1.2 mg/dL   Alkaline Phosphatase 45 39 - 117 IU/L   AST 12 0 - 40 IU/L   ALT 9 0 - 32 IU/L      Assessment & Plan:   1. Hemorrhoids, unspecified hemorrhoid type - hydrocortisone (ANUSOL-HC) 25 MG suppository; Place 1 suppository (25 mg total) rectally 2 (two) times daily.  Dispense: 12 suppository; Refill: 0  2. Constipation, unspecified constipation type miralax  3. Cyst of skin Reassure Can refer if needed or becomes painful   Continue all other maintenance medications as listed above.  Follow up plan: No follow-ups on file.  Educational handout given for [facility] information  Terald Sleeper PA-C [facility] [address]  [place], [place] [address] [phone]   04/27/2018, 12:27 PM

## 2018-04-29 ENCOUNTER — Ambulatory Visit (INDEPENDENT_AMBULATORY_CARE_PROVIDER_SITE_OTHER): Payer: Medicare Other | Admitting: Urology

## 2018-04-29 ENCOUNTER — Other Ambulatory Visit (HOSPITAL_COMMUNITY)
Admission: RE | Admit: 2018-04-29 | Discharge: 2018-04-29 | Disposition: A | Discharge status: Home / Self Care | Payer: Medicare Other | Source: Other Acute Inpatient Hospital | Attending: Urology | Admitting: Urology

## 2018-04-29 DIAGNOSIS — R1032 Left lower quadrant pain: Secondary | ICD-10-CM | POA: Diagnosis not present

## 2018-04-29 DIAGNOSIS — N390 Urinary tract infection, site not specified: Secondary | ICD-10-CM | POA: Diagnosis not present

## 2018-04-29 DIAGNOSIS — Z87442 Personal history of urinary calculi: Secondary | ICD-10-CM | POA: Diagnosis not present

## 2018-04-29 DIAGNOSIS — N3946 Mixed incontinence: Secondary | ICD-10-CM | POA: Diagnosis not present

## 2018-04-29 LAB — URINALYSIS, COMPLETE (UACMP) WITH MICROSCOPIC
Bilirubin Urine: NEGATIVE
GLUCOSE, UA: NEGATIVE mg/dL
Ketones, ur: NEGATIVE mg/dL
NITRITE: NEGATIVE
Protein, ur: 100 mg/dL — AB
SPECIFIC GRAVITY, URINE: 1.015 (ref 1.005–1.030)
WBC, UA: >50 WBC/hpf — ABNORMAL HIGH (ref 0–5)
pH: 5 (ref 5.0–8.0)

## 2018-05-02 LAB — URINE CULTURE: Culture: >=100000 — AB

## 2018-05-04 ENCOUNTER — Other Ambulatory Visit: Payer: Self-pay | Admitting: Family Medicine

## 2018-05-04 DIAGNOSIS — F411 Generalized anxiety disorder: Secondary | ICD-10-CM

## 2018-05-04 DIAGNOSIS — I25709 Atherosclerosis of coronary artery bypass graft(s), unspecified, with unspecified angina pectoris: Secondary | ICD-10-CM

## 2018-05-17 DIAGNOSIS — L84 Corns and callosities: Secondary | ICD-10-CM | POA: Diagnosis not present

## 2018-05-17 DIAGNOSIS — E1142 Type 2 diabetes mellitus with diabetic polyneuropathy: Secondary | ICD-10-CM | POA: Diagnosis not present

## 2018-05-17 DIAGNOSIS — B351 Tinea unguium: Secondary | ICD-10-CM | POA: Diagnosis not present

## 2018-05-17 DIAGNOSIS — M79676 Pain in unspecified toe(s): Secondary | ICD-10-CM | POA: Diagnosis not present

## 2018-05-22 ENCOUNTER — Other Ambulatory Visit: Payer: Self-pay | Admitting: Family Medicine

## 2018-05-22 DIAGNOSIS — I10 Essential (primary) hypertension: Secondary | ICD-10-CM

## 2018-05-29 ENCOUNTER — Other Ambulatory Visit: Payer: Self-pay | Admitting: Family Medicine

## 2018-05-31 ENCOUNTER — Other Ambulatory Visit: Payer: Self-pay | Admitting: Family Medicine

## 2018-06-08 DIAGNOSIS — E119 Type 2 diabetes mellitus without complications: Secondary | ICD-10-CM | POA: Diagnosis not present

## 2018-06-08 DIAGNOSIS — H5213 Myopia, bilateral: Secondary | ICD-10-CM | POA: Diagnosis not present

## 2018-06-13 ENCOUNTER — Telehealth: Payer: Self-pay | Admitting: *Deleted

## 2018-06-13 MED ORDER — BLOOD GLUCOSE MONITOR KIT: PACK | 0 refills | Status: DC

## 2018-06-13 NOTE — Telephone Encounter (Signed)
Needs glucose meter sent in to [facility]. Will not go electronically. Printed and will have Dr Wendi Snipes sign tomorrow and then we'll fax to pharmacy.

## 2018-07-03 ENCOUNTER — Other Ambulatory Visit: Payer: Self-pay | Admitting: Family Medicine

## 2018-07-03 DIAGNOSIS — E119 Type 2 diabetes mellitus without complications: Secondary | ICD-10-CM

## 2018-07-03 DIAGNOSIS — I10 Essential (primary) hypertension: Secondary | ICD-10-CM

## 2018-07-03 DIAGNOSIS — Z794 Long term (current) use of insulin: Principal | ICD-10-CM

## 2018-07-12 ENCOUNTER — Ambulatory Visit (INDEPENDENT_AMBULATORY_CARE_PROVIDER_SITE_OTHER): Payer: Medicare Other | Admitting: Family Medicine

## 2018-07-12 ENCOUNTER — Encounter: Payer: Self-pay | Admitting: Family Medicine

## 2018-07-12 VITALS — BP 172/65 | HR 78 | Temp 97.3°F | Ht 66.0 in | Wt 148.0 lb

## 2018-07-12 DIAGNOSIS — N183 Chronic kidney disease, stage 3 unspecified: Secondary | ICD-10-CM

## 2018-07-12 DIAGNOSIS — E119 Type 2 diabetes mellitus without complications: Secondary | ICD-10-CM

## 2018-07-12 DIAGNOSIS — J301 Allergic rhinitis due to pollen: Secondary | ICD-10-CM

## 2018-07-12 DIAGNOSIS — M1612 Unilateral primary osteoarthritis, left hip: Secondary | ICD-10-CM | POA: Diagnosis not present

## 2018-07-12 DIAGNOSIS — Z794 Long term (current) use of insulin: Secondary | ICD-10-CM

## 2018-07-12 DIAGNOSIS — I10 Essential (primary) hypertension: Secondary | ICD-10-CM | POA: Diagnosis not present

## 2018-07-12 DIAGNOSIS — K219 Gastro-esophageal reflux disease without esophagitis: Secondary | ICD-10-CM

## 2018-07-12 DIAGNOSIS — E782 Mixed hyperlipidemia: Secondary | ICD-10-CM

## 2018-07-12 LAB — BAYER DCA HB A1C WAIVED: HB A1C (BAYER DCA - WAIVED): 7.3 % — ABNORMAL HIGH (ref <7.0)

## 2018-07-12 MED ORDER — CLOPIDOGREL BISULFATE 75 MG PO TABS: ORAL_TABLET | ORAL | 5 refills | Status: DC

## 2018-07-12 MED ORDER — INSULIN DETEMIR 100 UNIT/ML FLEXPEN: PEN_INJECTOR | SUBCUTANEOUS | 0 refills | Status: DC

## 2018-07-12 MED ORDER — GLUCOSE BLOOD VI STRP: ORAL_STRIP | 3 refills | Status: DC

## 2018-07-12 MED ORDER — METFORMIN HCL ER 500 MG PO TB24: ORAL_TABLET | ORAL | 5 refills | Status: DC

## 2018-07-12 MED ORDER — METHENAMINE HIPPURATE 1 G PO TABS: 1.0000 g | ORAL_TABLET | Freq: Two times a day (BID) | ORAL | 5 refills | Status: DC

## 2018-07-12 MED ORDER — LORATADINE 10 MG PO TABS: 10.0000 mg | ORAL_TABLET | Freq: Every day | ORAL | 11 refills | Status: DC

## 2018-07-12 MED ORDER — BETAMETHASONE SOD PHOS & ACET 6 (3-3) MG/ML IJ SUSP
6.0000 mg | Freq: Once | INTRAMUSCULAR | Status: AC
  Administered 2018-07-12: 6 mg via INTRAMUSCULAR

## 2018-07-12 MED ORDER — INSULIN PEN NEEDLE 31G X 8 MM MISC: 2 refills | Status: DC

## 2018-07-12 MED ORDER — LISINOPRIL 20 MG PO TABS: ORAL_TABLET | ORAL | 5 refills | Status: DC

## 2018-07-12 MED ORDER — "INSULIN SYRINGE-NEEDLE U-100 31G X 5/16"" 1 ML MISC": 2 refills | Status: DC

## 2018-07-12 MED ORDER — AMLODIPINE BESYLATE 10 MG PO TABS: ORAL_TABLET | ORAL | 5 refills | Status: DC

## 2018-07-12 MED ORDER — INSULIN LISPRO 100 UNIT/ML (KWIKPEN): 4.0000 [IU] | PEN_INJECTOR | Freq: Every day | SUBCUTANEOUS | 5 refills | Status: DC | PRN

## 2018-07-12 MED ORDER — ESOMEPRAZOLE MAGNESIUM 40 MG PO CPDR: 40.0000 mg | DELAYED_RELEASE_CAPSULE | Freq: Every day | ORAL | 5 refills | Status: DC

## 2018-07-12 MED ORDER — ALENDRONATE SODIUM 70 MG PO TBEF: EFFERVESCENT_TABLET | ORAL | 11 refills | Status: DC

## 2018-07-12 MED ORDER — FORA LANCETS MISC: 3 refills | Status: DC

## 2018-07-12 NOTE — Progress Notes (Signed)
Subjective:  [name],  female    DOB: 1931/07/29  Age: 84 y.o.    CC: Medical Management of Chronic Issues   HPI [name] presents for  follow-up of hypertension. Patient has no history of headache chest pain or shortness of breath or recent cough. Patient also denies symptoms of TIA such as numbness weakness lateralizing. Patient denies side effects from medication. States taking it regularly.  Patient also  in for follow-up of elevated cholesterol. Doing well without complaints on current medication. Denies side effects  including myalgia and arthralgia and nausea. Also in today for liver function testing. Currently no chest pain, shortness of breath or other cardiovascular related symptoms noted.  Follow-up of diabetes.  Patient does not check blood sugar at home.  She is willing to try if we can get her meter. Patient denies symptoms such as excessive hunger or urinary frequency, excessive hunger, nausea No significant hypoglycemic spells noted. Medications reviewed. Pt reports taking them regularly. Pt. denies complication/adverse reaction today.   Patient in for follow-up of GERD. Currently asymptomatic taking  PPI daily. There is no chest pain or heartburn. No hematemesis and no melena. No dysphagia or choking. Onset is remote. Progression is stable. Complicating factors, none.  History [name] has a past medical history of Anemia, Anxiety, Arthritis, Asthma, Balance problems, CAD (coronary artery disease), Carotid stenosis, CHF (congestive heart failure) ([place]), CVA (cerebral infarction), Dementia, Depression, Diabetes mellitus, GERD (gastroesophageal reflux disease), Hearing loss, Hypertension, Iron deficiency anemia (05/27/2011), Myocardial infarction [facility]), Renal insufficiency, Shortness of breath dyspnea, Vertigo, and Vision loss.   She has a past surgical history that includes I & D of [place] (April 2013); Coronary artery bypass graft (2006); Coronary  angioplasty; Total abdominal hysterectomy (~84 years old); Cholecystectomy; Appendectomy; Cataract extraction (Bilateral, 5 years ago); Total knee arthroplasty (Right, 08/13/2015); and Knee surgery (Right).   Her family history includes Cancer in her brother; Diabetes in her sister and sister; Early death in her sister and son; Heart attack in her brother, brother, sister, and son; Heart attack (age of onset: 36) in her daughter; Heart disease in her brother, brother, and brother; Hypertension in her sister; Osteoporosis in her sister; Pancreatitis in her son.She reports that she has never smoked. She has never used smokeless tobacco. She reports that she does not drink alcohol or use drugs.  Current Outpatient Medications on File Prior to Visit  Medication Sig Dispense Refill  . Artificial Tear Ointment (DRY EYES OP) Apply 1 drop to eye daily as needed (dry/red eyes).     Mar[place] Kitchen aspirin 81 MG chewable tablet Chew 81 mg by mouth at bedtime.    Marland Kitchen atorvastatin (LIPITOR) 20 MG tablet Take 1 tablet (20 mg total) by mouth at bedtime. 90 tablet 3  . blood glucose meter kit and supplies KIT Dispense based on insurance coverage. Use 4 times a day as directed Dx: E11.9, Z79.4 1 each 0  . ferrous sulfate 325 (65 FE) MG tablet TAKE 1 TABLET 3 TIMES A DAY AFTER MEALS. 90 tablet 4  . furosemide (LASIX) 20 MG tablet Take 1 tablet (20 mg total) by mouth daily as needed for fluid or edema. 90 tablet 3  . hydrocortisone (ANUSOL-HC) 25 MG suppository Place 1 suppository (25 mg total) rectally 2 (two) times daily. 12 suppository 0  . isosorbide mononitrate (IMDUR) 30 MG 24 hr tablet Take 1 tablet (30 mg total) by mouth daily. 90 tablet 3  . LORazepam (ATIVAN) 0.5 MG tablet  TAKE 1 TABLET BY MOUTH TWICE DAILY AS NEEDED FOR ANXIETY. 30 tablet 3  . meclizine (ANTIVERT) 25 MG tablet TAKE 1 TABLET TWICE DAILY AS NEEDED FOR DIZZINESS. 60 tablet 0  . metoprolol tartrate (LOPRESSOR) 50 MG tablet TAKE (1) TABLET TWICE DAILY. 60 tablet  5  . Multiple Vitamins-Iron (DAILY VITAMINS/IRON/BETA CAROT PO) Take 1 tablet by mouth at bedtime.    . mupirocin ointment (BACTROBAN) 2 % Apply 1 application topically 2 (two) times daily. 22 g 0  . nitroGLYCERIN (NITROSTAT) 0.4 MG SL tablet PLACE ONE (1) TABLET UNDER TONGUE EVERY 5 MINUTES UP TO (3) DOSES AS NEEDED FOR CHEST PAIN. 25 tablet 2  . PARoxetine (PAXIL) 20 MG tablet TAKE (1) TABLET BY MOUTH ONCE DAILY. 90 tablet 3  . trimethoprim (TRIMPEX) 100 MG tablet Take 100 mg by mouth daily.     No current facility-administered medications on file prior to visit.     ROS Review of Systems  Constitutional: Negative.   HENT: Negative for congestion.   Eyes: Negative for visual disturbance.  Respiratory: Negative for shortness of breath.   Cardiovascular: Negative for chest pain.  Gastrointestinal: Negative for abdominal pain, constipation, diarrhea, nausea and vomiting.  Genitourinary: Negative for difficulty urinating.  Musculoskeletal: Positive for arthralgias (Acute on chronic pain at left hip.). Negative for myalgias.  Neurological: Positive for speech difficulty. Negative for headaches.  Psychiatric/Behavioral: Negative for sleep disturbance.    Objective:  BP (!) 172/65   Pulse 78   Temp (!) 97.3 F (36.3 C) (Oral)   Ht 5' 6"  (1.676 m)   Wt 148 lb (67.1 kg)   BMI 23.89 kg/m   BP Readings from Last 3 Encounters:  07/12/18 (!) 172/65  04/26/18 (!) 149/65  04/21/18 (!) 165/90    Wt Readings from Last 3 Encounters:  07/12/18 148 lb (67.1 kg)  04/26/18 145 lb 9.6 oz (66 kg)  04/21/18 146 lb (66.2 kg)     Physical Exam  Constitutional: She is oriented to person, place, and time. She appears well-developed and well-nourished. No distress.  HENT:  Head: Normocephalic and atraumatic.  Eyes: Pupils are equal, round, and reactive to light. Conjunctivae are normal.  Neck: Normal range of motion. Neck supple. No thyromegaly present.  Cardiovascular: Normal rate, regular  rhythm and normal heart sounds.  No murmur heard. Pulmonary/Chest: Effort normal and breath sounds normal. No respiratory distress. She has no wheezes. She has no rales.  Abdominal: Soft. Bowel sounds are normal. She exhibits no distension. There is no tenderness.  Musculoskeletal: Normal range of motion.  Lymphadenopathy:    She has no cervical adenopathy.  Neurological: She is alert and oriented to person, place, and time.  Speech apraxia noted  Skin: Skin is warm and dry.  Psychiatric: She has a normal mood and affect. Her behavior is normal. Judgment and thought content normal.    Diabetic Foot Exam - Simple   No data filed        Assessment & Plan:   [name] was seen today for medical management of chronic issues.  Diagnoses and all orders for this visit:  Type 2 diabetes mellitus with insulin therapy (HCC) -     clopidogrel (PLAVIX) 75 MG tablet; TAKE (1) TABLET BY MOUTH ONCE DAILY. -     metFORMIN (GLUCOPHAGE-XR) 500 MG 24 hr tablet; TAKE 1 TABLET WITH BREAKFAST AND 1 TABLET WITH SUPPER. -     Microalbumin / creatinine urine ratio -     Bayer DCA Hb A1c  Waived  CKD (chronic kidney disease) stage 3, GFR 30-59 ml/min (HCC)  Essential hypertension  Mixed hyperlipidemia -     CBC with Differential/Platelet -     CMP14+EGFR -     Lipid panel  Allergic rhinitis due to pollen, unspecified seasonality -     loratadine (CLARITIN) 10 MG tablet; Take 1 tablet (10 mg total) by mouth daily.  Gastroesophageal reflux disease, esophagitis presence not specified -     esomeprazole (NEXIUM) 40 MG capsule; Take 1 capsule (40 mg total) by mouth daily.  Arthritis of left hip -     betamethasone acetate-betamethasone sodium phosphate (CELESTONE) injection 6 mg  Other orders -     Alendronate Sodium (BINOSTO) 70 MG TBEF; Mix 1 effervescent tablet with at least 4 ounces of water and allow to dissolve completely (about 10 min). Then drink entire mixture once a WEEK.  Drink prior to any  food and do not eat, drink anything other than water or lie down for 30 minutes after drinking mixture. -     amLODipine (NORVASC) 10 MG tablet; TAKE (1) TABLET BY MOUTH ONCE DAILY. -     FORA LANCETS [place]; Test blood sugars twice daily -     glucose blood (ACCU-CHEK AVIVA) test strip; Use to check blood glucose twice a day -     insulin lispro (HUMALOG KWIKPEN) 100 UNIT/ML KiwkPen; Inject 0.04-0.1 mLs (4-10 Units total) into the skin daily as needed (high blood sugar). -     Insulin Pen Needle (GLOBAL EASE INJECT PEN NEEDLES) 31G X 8 MM MISC; AS DIRECTED -     Insulin Syringe-Needle U-100 (EASY TOUCH INSULIN SYRINGE) 31G X 5/16" 1 ML MISC; USE AS DIRECTED. -     Insulin Detemir (LEVEMIR FLEXTOUCH) 100 UNIT/ML Pen; INJECT 22-28 UNITS INTO THE SKIN DAILY. -     lisinopril (PRINIVIL,ZESTRIL) 20 MG tablet; TAKE (1) TABLET BY MOUTH ONCE DAILY. -     methenamine (HIPREX) 1 g tablet; Take 1 tablet (1 g total) by mouth 2 (two) times daily.   I have changed [name]'s LEVEMIR FLEXTOUCH to Insulin Detemir. I have also changed her esomeprazole. I am also having her maintain her Artificial Tear Ointment (DRY EYES OP), Multiple Vitamins-Iron (DAILY VITAMINS/IRON/BETA CAROT PO), trimethoprim, aspirin, furosemide, atorvastatin, mupirocin ointment, PARoxetine, isosorbide mononitrate, nitroGLYCERIN, hydrocortisone, LORazepam, metoprolol tartrate, ferrous sulfate, blood glucose meter kit and supplies, meclizine, loratadine, clopidogrel, metFORMIN, Alendronate Sodium, amLODipine, FORA LANCETS, glucose blood, insulin lispro, Insulin Pen Needle, Insulin Syringe-Needle U-100, lisinopril, and methenamine. We administered betamethasone acetate-betamethasone sodium phosphate.  Meds ordered this encounter  Medications  . loratadine (CLARITIN) 10 MG tablet    Sig: Take 1 tablet (10 mg total) by mouth daily.    Dispense:  30 tablet    Refill:  11  . esomeprazole (NEXIUM) 40 MG capsule    Sig: Take 1 capsule (40 mg  total) by mouth daily.    Dispense:  30 capsule    Refill:  5  . clopidogrel (PLAVIX) 75 MG tablet    Sig: TAKE (1) TABLET BY MOUTH ONCE DAILY.    Dispense:  30 tablet    Refill:  5  . metFORMIN (GLUCOPHAGE-XR) 500 MG 24 hr tablet    Sig: TAKE 1 TABLET WITH BREAKFAST AND 1 TABLET WITH SUPPER.    Dispense:  60 tablet    Refill:  5  . Alendronate Sodium (BINOSTO) 70 MG TBEF    Sig: Mix 1 effervescent tablet with at least 4  ounces of water and allow to dissolve completely (about 10 min). Then drink entire mixture once a WEEK.  Drink prior to any food and do not eat, drink anything other than water or lie down for 30 minutes after drinking mixture.    Dispense:  4 tablet    Refill:  11  . amLODipine (NORVASC) 10 MG tablet    Sig: TAKE (1) TABLET BY MOUTH ONCE DAILY.    Dispense:  30 tablet    Refill:  5  . FORA LANCETS MISC    Sig: Test blood sugars twice daily    Dispense:  200 each    Refill:  3    Dx E11.9  . glucose blood (ACCU-CHEK AVIVA) test strip    Sig: Use to check blood glucose twice a day    Dispense:  200 each    Refill:  3    Dx E11.9  . insulin lispro (HUMALOG KWIKPEN) 100 UNIT/ML KiwkPen    Sig: Inject 0.04-0.1 mLs (4-10 Units total) into the skin daily as needed (high blood sugar).    Dispense:  15 mL    Refill:  5  . Insulin Pen Needle (GLOBAL EASE INJECT PEN NEEDLES) 31G X 8 MM MISC    Sig: AS DIRECTED    Dispense:  100 each    Refill:  2    E11.9  . Insulin Syringe-Needle U-100 (EASY TOUCH INSULIN SYRINGE) 31G X 5/16" 1 ML MISC    Sig: USE AS DIRECTED.    Dispense:  80 each    Refill:  2    E11.9  . Insulin Detemir (LEVEMIR FLEXTOUCH) 100 UNIT/ML Pen    Sig: INJECT 22-28 UNITS INTO THE SKIN DAILY.    Dispense:  15 mL    Refill:  0  . lisinopril (PRINIVIL,ZESTRIL) 20 MG tablet    Sig: TAKE (1) TABLET BY MOUTH ONCE DAILY.    Dispense:  30 tablet    Refill:  5  . methenamine (HIPREX) 1 g tablet    Sig: Take 1 tablet (1 g total) by mouth 2 (two) times  daily.    Dispense:  60 tablet    Refill:  5  . betamethasone acetate-betamethasone sodium phosphate (CELESTONE) injection 6 mg     Follow-up: No follow-ups on file.  [name], M.D.

## 2018-07-13 LAB — LIPID PANEL
CHOLESTEROL TOTAL: 155 mg/dL (ref 100–199)
Chol/HDL Ratio: 2 ratio (ref 0.0–4.4)
HDL: 76 mg/dL (ref >39)
LDL Calculated: 54 mg/dL (ref 0–99)
Triglycerides: 124 mg/dL (ref 0–149)
VLDL CHOLESTEROL CAL: 25 mg/dL (ref 5–40)

## 2018-07-13 LAB — MICROALBUMIN / CREATININE URINE RATIO
Creatinine, Urine: 70 mg/dL
MICROALB/CREAT RATIO: 1474.6 mg/g{creat} — AB (ref 0.0–30.0)
Microalbumin, Urine: 1032.2 ug/mL

## 2018-07-13 LAB — CBC WITH DIFFERENTIAL/PLATELET
BASOS: 1 %
Basophils Absolute: 0 10*3/uL (ref 0.0–0.2)
EOS (ABSOLUTE): 0.2 10*3/uL (ref 0.0–0.4)
Eos: 5 %
Hematocrit: 36.1 % (ref 34.0–46.6)
Hemoglobin: 12 g/dL (ref 11.1–15.9)
IMMATURE GRANS (ABS): 0 10*3/uL (ref 0.0–0.1)
IMMATURE GRANULOCYTES: 0 %
LYMPHS: 28 %
Lymphocytes Absolute: 1.4 10*3/uL (ref 0.7–3.1)
MCH: 29.8 pg (ref 26.6–33.0)
MCHC: 33.2 g/dL (ref 31.5–35.7)
MCV: 90 fL (ref 79–97)
Monocytes Absolute: 0.3 10*3/uL (ref 0.1–0.9)
Monocytes: 6 %
NEUTROS PCT: 60 %
Neutrophils Absolute: 3 10*3/uL (ref 1.4–7.0)
PLATELETS: 180 10*3/uL (ref 150–450)
RBC: 4.03 x10E6/uL (ref 3.77–5.28)
RDW: 15.4 % (ref 12.3–15.4)
WBC: 4.9 10*3/uL (ref 3.4–10.8)

## 2018-07-13 LAB — CMP14+EGFR
A/G RATIO: 1.7 (ref 1.2–2.2)
ALT: 9 IU/L (ref 0–32)
AST: 10 IU/L (ref 0–40)
Albumin: 4.1 g/dL (ref 3.5–4.7)
Alkaline Phosphatase: 44 IU/L (ref 39–117)
BILIRUBIN TOTAL: 0.3 mg/dL (ref 0.0–1.2)
BUN/Creatinine Ratio: 21 (ref 12–28)
BUN: 26 mg/dL (ref 8–27)
CALCIUM: 9.5 mg/dL (ref 8.7–10.3)
CHLORIDE: 104 mmol/L (ref 96–106)
CO2: 21 mmol/L (ref 20–29)
Creatinine, Ser: 1.23 mg/dL — ABNORMAL HIGH (ref 0.57–1.00)
GFR calc non Af Amer: 40 mL/min/{1.73_m2} — ABNORMAL LOW (ref >59)
GFR, EST AFRICAN AMERICAN: 47 mL/min/{1.73_m2} — AB (ref >59)
Globulin, Total: 2.4 g/dL (ref 1.5–4.5)
Glucose: 168 mg/dL — ABNORMAL HIGH (ref 65–99)
POTASSIUM: 4.9 mmol/L (ref 3.5–5.2)
Sodium: 140 mmol/L (ref 134–144)
TOTAL PROTEIN: 6.5 g/dL (ref 6.0–8.5)

## 2018-07-15 DIAGNOSIS — E119 Type 2 diabetes mellitus without complications: Secondary | ICD-10-CM | POA: Diagnosis not present

## 2018-07-25 ENCOUNTER — Other Ambulatory Visit: Payer: Self-pay

## 2018-07-25 ENCOUNTER — Encounter (HOSPITAL_COMMUNITY): Payer: Self-pay | Admitting: Emergency Medicine

## 2018-07-25 ENCOUNTER — Emergency Department (HOSPITAL_COMMUNITY): Payer: Medicare Other

## 2018-07-25 ENCOUNTER — Emergency Department (HOSPITAL_COMMUNITY)
Admission: EM | Admit: 2018-07-25 | Discharge: 2018-07-25 | Disposition: A | Discharge status: Home / Self Care | Payer: Medicare Other | Attending: Emergency Medicine | Admitting: Emergency Medicine

## 2018-07-25 ENCOUNTER — Ambulatory Visit: Payer: Medicare Other | Admitting: Physician Assistant

## 2018-07-25 DIAGNOSIS — K5900 Constipation, unspecified: Secondary | ICD-10-CM | POA: Diagnosis not present

## 2018-07-25 DIAGNOSIS — K625 Hemorrhage of anus and rectum: Secondary | ICD-10-CM | POA: Insufficient documentation

## 2018-07-25 DIAGNOSIS — F0390 Unspecified dementia without behavioral disturbance: Secondary | ICD-10-CM | POA: Diagnosis not present

## 2018-07-25 DIAGNOSIS — R109 Unspecified abdominal pain: Secondary | ICD-10-CM | POA: Diagnosis not present

## 2018-07-25 DIAGNOSIS — I509 Heart failure, unspecified: Secondary | ICD-10-CM | POA: Insufficient documentation

## 2018-07-25 DIAGNOSIS — J45909 Unspecified asthma, uncomplicated: Secondary | ICD-10-CM | POA: Diagnosis not present

## 2018-07-25 DIAGNOSIS — I252 Old myocardial infarction: Secondary | ICD-10-CM | POA: Diagnosis not present

## 2018-07-25 DIAGNOSIS — E119 Type 2 diabetes mellitus without complications: Secondary | ICD-10-CM | POA: Insufficient documentation

## 2018-07-25 DIAGNOSIS — I110 Hypertensive heart disease with heart failure: Secondary | ICD-10-CM | POA: Insufficient documentation

## 2018-07-25 DIAGNOSIS — R933 Abnormal findings on diagnostic imaging of other parts of digestive tract: Secondary | ICD-10-CM | POA: Diagnosis not present

## 2018-07-25 DIAGNOSIS — Z96651 Presence of right artificial knee joint: Secondary | ICD-10-CM | POA: Insufficient documentation

## 2018-07-25 DIAGNOSIS — Z955 Presence of coronary angioplasty implant and graft: Secondary | ICD-10-CM | POA: Insufficient documentation

## 2018-07-25 HISTORY — DX: Scoliosis, unspecified: M41.9

## 2018-07-25 LAB — TYPE AND SCREEN
ABO/RH(D): A POS
ANTIBODY SCREEN: NEGATIVE

## 2018-07-25 LAB — COMPREHENSIVE METABOLIC PANEL
ALT: 9 U/L (ref 0–44)
AST: 11 U/L — ABNORMAL LOW (ref 15–41)
Albumin: 3.6 g/dL (ref 3.5–5.0)
Alkaline Phosphatase: 37 U/L — ABNORMAL LOW (ref 38–126)
Anion gap: 10 (ref 5–15)
BUN: 29 mg/dL — ABNORMAL HIGH (ref 8–23)
CO2: 24 mmol/L (ref 22–32)
Calcium: 9.7 mg/dL (ref 8.9–10.3)
Chloride: 106 mmol/L (ref 98–111)
Creatinine, Ser: 1.17 mg/dL — ABNORMAL HIGH (ref 0.44–1.00)
GFR calc Af Amer: 48 mL/min — ABNORMAL LOW (ref >60)
GFR calc non Af Amer: 42 mL/min — ABNORMAL LOW (ref >60)
Glucose, Bld: 134 mg/dL — ABNORMAL HIGH (ref 70–99)
Potassium: 4.5 mmol/L (ref 3.5–5.1)
Sodium: 140 mmol/L (ref 135–145)
Total Bilirubin: 0.7 mg/dL (ref 0.3–1.2)
Total Protein: 6.8 g/dL (ref 6.5–8.1)

## 2018-07-25 LAB — CBC
HCT: 35.7 % — ABNORMAL LOW (ref 36.0–46.0)
Hemoglobin: 11.6 g/dL — ABNORMAL LOW (ref 12.0–15.0)
MCH: 28.8 pg (ref 26.0–34.0)
MCHC: 32.5 g/dL (ref 30.0–36.0)
MCV: 88.6 fL (ref 78.0–100.0)
Platelets: 199 10*3/uL (ref 150–400)
RBC: 4.03 MIL/uL (ref 3.87–5.11)
RDW: 14.4 % (ref 11.5–15.5)
WBC: 5.8 10*3/uL (ref 4.0–10.5)

## 2018-07-25 MED ORDER — IOPAMIDOL (ISOVUE-300) INJECTION 61%: 75.0000 mL | Freq: Once | INTRAVENOUS | Status: DC | PRN

## 2018-07-25 MED ORDER — IOHEXOL 300 MG/ML  SOLN
75.0000 mL | Freq: Once | INTRAMUSCULAR | Status: AC | PRN
  Administered 2018-07-25: 75 mL via INTRAVENOUS

## 2018-07-25 MED ORDER — POLYETHYLENE GLYCOL 3350 17 G PO PACK: 17.0000 g | PACK | Freq: Every day | ORAL | 0 refills | Status: DC

## 2018-07-25 MED ORDER — OXYCODONE HCL 5 MG PO TABS
5.0000 mg | ORAL_TABLET | Freq: Once | ORAL | Status: AC
  Administered 2018-07-25: 5 mg via ORAL
  Filled 2018-07-25: qty 1

## 2018-07-25 NOTE — ED Triage Notes (Signed)
Patient complaining of rectal bleeding intermittent x 1 month. Also complaining of abdominal pain but family states she has complained with same for 6 months.

## 2018-07-25 NOTE — Discharge Instructions (Addendum)
You were evaluated in the Emergency Department and after careful evaluation, we did not find any emergent condition requiring admission or further testing in the hospital.  Your bleeding seems to be related to your known hemorrhoids.  Your pain may be related to constipation.  Please use the medication provided as directed to help soften your stool.  Your CT today showed [place] a small abnormality of your colon that would be better evaluated with colonoscopy.  Please discuss the pros and cons of colonoscopy with your primary care provider.  Please return to the Emergency Department if you experience any worsening of your condition.  We encourage you to follow up with a primary care provider.  Thank you for allowing [place] to be a part of your care.

## 2018-07-25 NOTE — ED Provider Notes (Signed)
[facility] Emergency Department Provider Note MRN:  [id number]  Arrival date & time: 07/25/18     Chief Complaint   Rectal Bleeding   History of Present Illness   [name] is a 84 y.o. year-old female with a history of CHF, CVA, chronic back pain due to scoliosis, hemorrhoids presenting to the ED with chief complaint of rectal bleeding.  Patient has been having diffuse, migratory abdominal pain for 1 to 2 years.  Has also been having rectal bleeding for several months, thought to be due to her hemorrhoids.  Here today because daughter is concerned that the bleeding is getting worse, also concerned that the bleeding may be coming from the patient's vagina.  Patient has a remote history of uterine cancer per family.  Patient denies any fevers or chills, no chest pain or shortness of breath.  She thinks that her abdominal pain is getting worse.  Denies dysuria.  Review of Systems  A complete 10 system review of systems was obtained and all systems are negative except as noted in the HPI and PMH.   Patient's Health History    Past Medical History:  Diagnosis Date  . Anemia   . Anxiety   . Arthritis    back, knees, and hips  . Asthma    with allergies  . Balance problems   . CAD (coronary artery disease)   . Carotid stenosis   . CHF (congestive heart failure) ([place])    EF preserved Echo 2012  . 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 Dementia    "a little"  . Depression   . Diabetes mellitus    type 2  . GERD (gastroesophageal reflux disease)   . Hearing loss   . Hypertension   . Iron deficiency anemia 05/27/2011  . Myocardial infarction ([place])   . Renal insufficiency   . Scoliosis   . Shortness of breath dyspnea    with activity  . Vertigo    "when sugar gets low"  . Vision loss    left eye-"half blind"    Past Surgical History:  Procedure Laterality Date  .  APPENDECTOMY     with hysterectomy  . CATARACT EXTRACTION Bilateral 5 years ago  . CHOLECYSTECTOMY    . CORONARY ANGIOPLASTY     prior to 2006 5 stents  . CORONARY ARTERY BYPASS GRAFT  2006  . I & D of [place]cle  April 2013  . KNEE SURGERY Right   . TOTAL ABDOMINAL HYSTERECTOMY  ~84 years old   complete, with tumor removal  . TOTAL KNEE ARTHROPLASTY Right 08/13/2015   Procedure: RIGHT  TOTAL KNEE ARTHROPLASTY;  Surgeon: Paralee Cancel, MD;  Location: [facility];  Service: Orthopedics;  Laterality: Right;    Family History  Problem Relation Age of Onset  . Cancer Brother        porstate  . Early death Sister   . Heart disease Brother   . Heart disease Brother   . Heart attack Brother   . Heart disease Brother   . Heart attack Brother   . Diabetes Sister   . Heart attack Sister   . Diabetes Sister   . Osteoporosis Sister   . Hypertension Sister   . Heart attack Son   . Early death Son   . Pancreatitis Son   . Heart attack Daughter 53    Social History  Socioeconomic History  . Marital status: Widowed    Spouse name: Not on file  . Number of children: 4  . Years of education: 3  . Highest education level: 3rd grade  Occupational History  . Occupation: retired  Scientific laboratory technician  . Financial resource strain: Not very hard  . Food insecurity:    Worry: Never true    Inability: Never true  . Transportation needs:    Medical: No    Non-medical: No  Tobacco Use  . Smoking status: Never Smoker  . Smokeless tobacco: Never Used  Substance and Sexual Activity  . Alcohol use: No  . Drug use: No  . Sexual activity: Not Currently  Lifestyle  . Physical activity:    Days per week: 0 days    Minutes per session: 0 min  . Stress: To some extent  Relationships  . Social connections:    Talks on phone: More than three times a week    Gets together: More than three times a week    Attends religious service: More than 4 times per year    Active member of club or organization: No     Attends meetings of clubs or organizations: Never    Relationship status: Widowed  . Intimate partner violence:    Fear of current or ex partner: No    Emotionally abused: No    Physically abused: No    Forced sexual activity: No  Other Topics Concern  . Not on file  Social History Narrative  . Not on file     Physical Exam  Vital Signs and Nursing Notes reviewed Vitals:   07/25/18 1445 07/25/18 1500  BP:  (!) 182/65  Pulse: 64 66  Resp:    Temp:    SpO2: 95% 93%    CONSTITUTIONAL: Elderly-appearing, in mild distress due to pain NEURO:  Alert and oriented x 3, no focal deficits EYES:  eyes equal and reactive ENT/NECK:  no LAD, no JVD CARDIO: Regular rate, well-perfused, normal S1 and S2 PULM:  CTAB no wheezing or rhonchi GI/GU:  normal bowel sounds, non-distended, non-tender, normal external genitalia, no blood on bimanual exam, no external or internal hemorrhoids noted anoscopy, no gross blood MSK/SPINE:  No gross deformities, no edema SKIN:  no rash, atraumatic PSYCH:  Appropriate speech and behavior  Diagnostic and Interventional Summary    EKG Interpretation  Date/Time:    Ventricular Rate:    PR Interval:    QRS Duration:   QT Interval:    QTC Calculation:   R Axis:     Text Interpretation:        Labs Reviewed  COMPREHENSIVE METABOLIC PANEL - Abnormal; Notable for the following components:      Result Value   Glucose, Bld 134 (*)    BUN 29 (*)    Creatinine, Ser 1.17 (*)    AST 11 (*)    Alkaline Phosphatase 37 (*)    GFR calc non Af Amer 42 (*)    GFR calc Af Amer 48 (*)    All other components within normal limits  CBC - Abnormal; Notable for the following components:   Hemoglobin 11.6 (*)    HCT 35.7 (*)    All other components within normal limits  TYPE AND SCREEN    CT ABDOMEN PELVIS W CONTRAST  Final Result      Medications  iopamidol (ISOVUE-300) 61 % injection 75 mL (has no administration in time range)  oxyCODONE (Oxy  IR/ROXICODONE)  immediate release tablet 5 mg (5 mg Oral Given 07/25/18 1315)  iohexol (OMNIPAQUE) 300 MG/ML solution 75 mL (75 mLs Intravenous Contrast Given 07/25/18 1516)     Procedures  Anoscopy An anoscopy was performed with the indication of rectal bleeding.  Findings: No gross blood, no external or internal hemorrhoids noted.  Critical Care  ED Course and Medical Decision Making  I have reviewed the triage vital signs and the nursing notes.  Pertinent labs & imaging results that were available during my care of the patient were reviewed by me and considered in my medical decision making (see below for details). Clinical Course as of Jul 25 1556  Mon Jul 25, 2018  1154 Question of worsening rectal bleeding versus uterine bleeding in this 84 year old female with history of known hemorrhoids as well as remote history of uterine cancer.  Vital signs stable, abdomen soft with mild generalized tenderness.  Will perform anogenital exam with anoscopy and reassess, considering CT abdomen to further evaluate for uterine pathology.   [MB]  5615 No clear origin of bleeding upon anoscopy, bimanual exam with no gross blood.  Given continued pain in lack of obvious source of bleeding, and considering patient's cancer history, will CT today.   [MB]    Clinical Course User Index [MB] Maudie Flakes, MD    CT reveals moderate stool burden, small abnormality of the colon that may be better evaluated with colonoscopy.  No emergent findings.  No obvious source of bleeding, likely an internal hemorrhoid that was not fully visualized on anoscopy today.  Findings discussed with patient, prescription for MiraLAX, will follow up with primary care provider to discuss need for colonoscopy.  Barth Kirks. [place]onia Small, [place] [email or web address]  Final Clinical Impressions(s) / ED Diagnoses     ICD-10-CM   1. Rectal bleeding K62.5   2. Constipation, unspecified  constipation type K59.00   3. Abnormal CT scan, colon R93.3     ED Discharge Orders         Ordered    polyethylene glycol [facility]) packet  Daily     07/25/18 Thom[place]ons, Doren Kaspar M, MD 07/25/18 1557

## 2018-07-31 ENCOUNTER — Other Ambulatory Visit: Payer: Self-pay | Admitting: Family Medicine

## 2018-07-31 DIAGNOSIS — I10 Essential (primary) hypertension: Secondary | ICD-10-CM

## 2018-07-31 DIAGNOSIS — E119 Type 2 diabetes mellitus without complications: Secondary | ICD-10-CM

## 2018-07-31 DIAGNOSIS — Z794 Long term (current) use of insulin: Principal | ICD-10-CM

## 2018-08-02 ENCOUNTER — Ambulatory Visit (INDEPENDENT_AMBULATORY_CARE_PROVIDER_SITE_OTHER): Payer: Medicare Other | Admitting: Family Medicine

## 2018-08-02 ENCOUNTER — Encounter: Payer: Self-pay | Admitting: Family Medicine

## 2018-08-02 ENCOUNTER — Ambulatory Visit: Payer: Medicare Other | Admitting: Family Medicine

## 2018-08-02 VITALS — BP 187/68 | HR 78 | Temp 98.0°F | Ht 66.0 in | Wt 147.5 lb

## 2018-08-02 DIAGNOSIS — N183 Chronic kidney disease, stage 3 unspecified: Secondary | ICD-10-CM

## 2018-08-02 DIAGNOSIS — K625 Hemorrhage of anus and rectum: Secondary | ICD-10-CM

## 2018-08-08 ENCOUNTER — Encounter: Payer: Self-pay | Admitting: Family Medicine

## 2018-08-08 NOTE — Progress Notes (Signed)
Subjective:  [name], female    DOB: [birth year]  Age: 84 y.o. MRN: [id number]  CC: ER follow up (pt here today for follow up of ER visit due to rectal bleeding where she had abnormal Colon CT)   HPI [name] presents for concern that some thickening of the colon wall found on CT last week might be cancer of the colon.  She was being seen for some rectal bleeding that she had noted.  She also had some black tarry bowel movements.  She is here to be referred for colonoscopy.  However, she is concerned about the risk of colonoscopy and wants advice as to whether she should proceed.  At age 39 she questions that it is feasible for her to go through a colonoscopy.  She would like other options if available.  Depression screen [facility] 2/9 07/12/2018 04/26/2018 04/21/2018  Decreased Interest 1 1 1   Down, Depressed, Hopeless 1 1 1   PHQ - 2 Score 2 2 2   Altered sleeping 1 1 1   Tired, decreased energy 1 0 1  Change in appetite 0 0 0  Feeling bad or failure about yourself  0 1 0  Trouble concentrating 1 1 1   Moving slowly or fidgety/restless 1 0 0  Suicidal thoughts 0 0 0  PHQ-9 Score 6 5 5   Difficult doing work/chores - - Somewhat difficult  Some recent data might be hidden    History [name] has a past medical history of Anemia, Anxiety, Arthritis, Asthma, Balance problems, CAD (coronary artery disease), Carotid stenosis, CHF (congestive heart failure) ([place]), CVA (cerebral infarction), Dementia, Depression, Diabetes mellitus, GERD (gastroesophageal reflux disease), Hearing loss, Hypertension, Iron deficiency anemia (05/27/2011), Myocardial infarction [facility]), Renal insufficiency, Scoliosis, Shortness of breath dyspnea, Vertigo, and Vision loss.   She has a past surgical history that includes I & D of Fur[place]cle (April 2013); Coronary artery bypass graft (2006); Coronary angioplasty; Total abdominal hysterectomy (~84 years old); Cholecystectomy; Appendectomy; Cataract extraction  (Bilateral, 5 years ago); Total knee arthroplasty (Right, 08/13/2015); and Knee surgery (Right).   Her family history includes Cancer in her brother; Diabetes in her sister and sister; Early death in her sister and son; Heart attack in her brother, brother, sister, and son; Heart attack (age of onset: 36) in her daughter; Heart disease in her brother, brother, and brother; Hypertension in her sister; Osteoporosis in her sister; Pancreatitis in her son.She reports that she has never smoked. She has never used smokeless tobacco. She reports that she does not drink alcohol or use drugs.    ROS Review of Systems  Constitutional: Negative.   HENT: Negative.   Eyes: Negative for visual disturbance.  Respiratory: Negative for shortness of breath.   Cardiovascular: Negative for chest pain.  Gastrointestinal: Positive for blood in stool. Negative for abdominal pain.  Musculoskeletal: Negative for arthralgias.    Objective:  BP (!) 187/68   Pulse 78   Temp 98 F (36.7 C) (Oral)   Ht 5' 6"  (1.676 m)   Wt 147 lb 8 oz (66.9 kg)   BMI 23.81 kg/m   BP Readings from Last 3 Encounters:  08/02/18 (!) 187/68  07/25/18 (!) 188/71  07/12/18 (!) 172/65    Wt Readings from Last 3 Encounters:  08/02/18 147 lb 8 oz (66.9 kg)  07/25/18 148 lb (67.1 kg)  07/12/18 148 lb (67.1 kg)     Physical Exam  Constitutional: She is oriented to person, place, and time. She appears well-developed and  well-nourished.  HENT:  Head: Normocephalic and atraumatic.  Cardiovascular: Normal rate and regular rhythm.  No murmur heard. Pulmonary/Chest: Effort normal and breath sounds normal.  Abdominal: Soft. Bowel sounds are normal. She exhibits no mass. There is no tenderness. There is no rebound and no guarding.  Neurological: She is alert and oriented to person, place, and time.  Skin: Skin is warm and dry.  Psychiatric: She has a normal mood and affect. Her behavior is normal.      Assessment & Plan:    [name] was seen today for er follow up.  Diagnoses and all orders for this visit:  Rectal bleeding -     Ambulatory referral to Gastroenterology     I am having [name] maintain her Artificial Tear Ointment (DRY EYES OP), Multiple Vitamins-Iron (DAILY VITAMINS/IRON/BETA CAROT PO), aspirin, furosemide, atorvastatin, mupirocin ointment, PARoxetine, isosorbide mononitrate, nitroGLYCERIN, hydrocortisone, LORazepam, metoprolol tartrate, ferrous sulfate, blood glucose meter kit and supplies, loratadine, esomeprazole, Alendronate Sodium, amLODipine, FORA LANCETS, glucose blood, insulin lispro, Insulin Pen Needle, Insulin Syringe-Needle U-100, Insulin Detemir, methenamine, polyethylene glycol, metFORMIN, meclizine, clopidogrel, and lisinopril.  Allergies as of 08/02/2018      Reactions   Advil [ibuprofen] Swelling   Heparin Other (See Comments)   Confusion   Propoxyphene N-acetaminophen Other (See Comments)   Numbness all over. "floating" sensation.      Medication List        Accurate as of 08/02/18 11:59 PM. Always use your most recent med list.          Alendronate Sodium 70 MG Tbef Mix 1 effervescent tablet with at least 4 ounces of water and allow to dissolve completely (about 10 min). Then drink entire mixture once a WEEK.  Drink prior to any food and do not eat, drink anything other than water or lie down for 30 minutes after drinking mixture.   amLODipine 10 MG tablet Commonly known as:  NORVASC TAKE (1) TABLET BY MOUTH ONCE DAILY.   aspirin 81 MG chewable tablet Chew 81 mg by mouth at bedtime.   atorvastatin 20 MG tablet Commonly known as:  LIPITOR Take 1 tablet (20 mg total) by mouth at bedtime.   blood glucose meter kit and supplies Kit Dispense based on insurance coverage. Use 4 times a day as directed Dx: E11.9, Z79.4   clopidogrel 75 MG tablet Commonly known as:  PLAVIX TAKE (1) TABLET BY MOUTH ONCE DAILY.   DAILY VITAMINS/IRON/BETA CAROT PO Take 1  tablet by mouth at bedtime.   DRY EYES OP Apply 1 drop to eye daily as needed (dry/red eyes).   esomeprazole 40 MG capsule Commonly known as:  NEXIUM Take 1 capsule (40 mg total) by mouth daily.   ferrous sulfate 325 (65 FE) MG tablet TAKE 1 TABLET 3 TIMES A DAY AFTER MEALS.   FORA LANCETS Misc Test blood sugars twice daily   furosemide 20 MG tablet Commonly known as:  LASIX Take 1 tablet (20 mg total) by mouth daily as needed for fluid or edema.   glucose blood test strip Use to check blood glucose twice a day   hydrocortisone 25 MG suppository Commonly known as:  ANUSOL-HC Place 1 suppository (25 mg total) rectally 2 (two) times daily.   Insulin Detemir 100 UNIT/ML Pen Commonly known as:  LEVEMIR INJECT 22-28 UNITS INTO THE SKIN DAILY.   insulin lispro 100 UNIT/ML KiwkPen Commonly known as:  HUMALOG Inject 0.04-0.1 mLs (4-10 Units total) into the skin daily as needed (high blood  sugar).   Insulin Pen Needle 31G X 8 MM Misc AS DIRECTED   Insulin Syringe-Needle U-100 31G X 5/16" 1 ML Misc USE AS DIRECTED.   isosorbide mononitrate 30 MG 24 hr tablet Commonly known as:  IMDUR Take 1 tablet (30 mg total) by mouth daily.   lisinopril 20 MG tablet Commonly known as:  PRINIVIL,ZESTRIL TAKE 1 TABLET DAILY.   loratadine 10 MG tablet Commonly known as:  CLARITIN Take 1 tablet (10 mg total) by mouth daily.   LORazepam 0.5 MG tablet Commonly known as:  ATIVAN TAKE 1 TABLET BY MOUTH TWICE DAILY AS NEEDED FOR ANXIETY.   meclizine 25 MG tablet Commonly known as:  ANTIVERT TAKE 1 TABLET TWICE DAILY AS NEEDED FOR DIZZINESS.   metFORMIN 500 MG 24 hr tablet Commonly known as:  GLUCOPHAGE-XR TAKE 1 TABLET WITH BREAKFAST AND 1 TABLET WITH SUPPER.   methenamine 1 g tablet Commonly known as:  HIPREX Take 1 tablet (1 g total) by mouth 2 (two) times daily.   metoprolol tartrate 50 MG tablet Commonly known as:  LOPRESSOR TAKE (1) TABLET TWICE DAILY.   mupirocin ointment  2 % Commonly known as:  BACTROBAN Apply 1 application topically 2 (two) times daily.   nitroGLYCERIN 0.4 MG SL tablet Commonly known as:  NITROSTAT PLACE ONE (1) TABLET UNDER TONGUE EVERY 5 MINUTES UP TO (3) DOSES AS NEEDED FOR CHEST PAIN.   PARoxetine 20 MG tablet Commonly known as:  PAXIL TAKE (1) TABLET BY MOUTH ONCE DAILY.   polyethylene glycol packet Commonly known as:  MIRALAX / GLYCOLAX Take 17 g by mouth daily.      Patient does not fit criteria for Cologuard test apparently.  She is due to age at high risk for complications of colonoscopy.  We discussed whether she would go through chemo and radiation etc. if colon cancer was found.  She was ambivalent based on age.  Therefore she was referred to GI for further information and potential testing.  1/2-hour was spent with this patient.  More than one half in counseling and discussing the pros and cons of proceeding with testing and or treatment should a cancer be found.  Follow-up: No follow-ups on file.  [name], M.D.

## 2018-08-09 ENCOUNTER — Encounter: Payer: Self-pay | Admitting: Family Medicine

## 2018-08-09 ENCOUNTER — Ambulatory Visit (INDEPENDENT_AMBULATORY_CARE_PROVIDER_SITE_OTHER): Payer: Medicare Other | Admitting: Family Medicine

## 2018-08-09 ENCOUNTER — Encounter: Payer: Self-pay | Admitting: Gastroenterology

## 2018-08-09 VITALS — BP 131/70 | HR 60 | Temp 98.6°F | Ht 66.0 in | Wt 151.0 lb

## 2018-08-09 DIAGNOSIS — R399 Unspecified symptoms and signs involving the genitourinary system: Secondary | ICD-10-CM

## 2018-08-09 DIAGNOSIS — R404 Transient alteration of awareness: Secondary | ICD-10-CM

## 2018-08-09 MED ORDER — METHENAMINE HIPPURATE 1 G PO TABS: 1.0000 g | ORAL_TABLET | Freq: Two times a day (BID) | ORAL | 5 refills | Status: DC

## 2018-08-09 MED ORDER — AMOXICILLIN 500 MG PO CAPS: 500.0000 mg | ORAL_CAPSULE | Freq: Three times a day (TID) | ORAL | 0 refills | Status: DC

## 2018-08-09 NOTE — Progress Notes (Signed)
Chief Complaint  Patient presents with  . Decreased Urine Output  . Hallucinations    HPI  Patient presents today for recurrent UTI. She had runout of methenamine for prevention of UTI 2 weeks ago. Now has strong smelling, dark urine. Family says she has been awakening during the night thinking people were in the room with her.She has been urinating less frequently. Some dysuria as well.  PMH: Smoking status noted ROS: Per HPI  Objective: BP 131/70   Pulse 60   Temp 98.6 F (37 C) (Oral)   Ht 5\' 6"  (1.676 m)   Wt 151 lb (68.5 kg)   BMI 24.37 kg/m  Gen: NAD, alert, cooperative with exam HEENT: NCAT, EOMI, PERRL CV: RRR, good S1/S2, no murmur Resp: CTABL, no wheezes, non-labored Abd: SNTND, BS present, no guarding or organomegaly Ext: No edema, warm Neuro: Alert and oriented, No gross deficits  Assessment and plan:  1. Transient alteration of awareness   2. UTI symptoms     Meds ordered this encounter  Medications  . methenamine (HIPREX) 1 g tablet    Sig: Take 1 tablet (1 g total) by mouth 2 (two) times daily.    Dispense:  60 tablet    Refill:  5  . amoxicillin (AMOXIL) 500 MG capsule    Sig: Take 1 capsule (500 mg total) by mouth 3 (three) times daily.    Dispense:  21 capsule    Refill:  0    Orders Placed This Encounter  Procedures  . Urine Culture    Standing Status:   Future    Number of Occurrences:   1    Standing Expiration Date:   09/08/2018    Follow up as needed.  [name], MD

## 2018-08-12 ENCOUNTER — Other Ambulatory Visit: Payer: Self-pay | Admitting: Family Medicine

## 2018-08-12 LAB — URINE CULTURE

## 2018-08-12 MED ORDER — CIPROFLOXACIN HCL 250 MG PO TABS
250.0000 mg | ORAL_TABLET | Freq: Two times a day (BID) | ORAL | 0 refills | Status: DC
Start: 2018-08-12 — End: 2018-09-12

## 2018-08-16 DIAGNOSIS — M79676 Pain in unspecified toe(s): Secondary | ICD-10-CM | POA: Diagnosis not present

## 2018-08-16 DIAGNOSIS — L84 Corns and callosities: Secondary | ICD-10-CM | POA: Diagnosis not present

## 2018-08-16 DIAGNOSIS — B351 Tinea unguium: Secondary | ICD-10-CM | POA: Diagnosis not present

## 2018-08-16 DIAGNOSIS — E1142 Type 2 diabetes mellitus with diabetic polyneuropathy: Secondary | ICD-10-CM | POA: Diagnosis not present

## 2018-09-05 DIAGNOSIS — Z1231 Encounter for screening mammogram for malignant neoplasm of breast: Secondary | ICD-10-CM | POA: Diagnosis not present

## 2018-09-05 LAB — HM MAMMOGRAPHY

## 2018-09-07 ENCOUNTER — Other Ambulatory Visit: Payer: Self-pay | Admitting: Family Medicine

## 2018-09-12 ENCOUNTER — Other Ambulatory Visit: Payer: Self-pay | Admitting: Family Medicine

## 2018-09-13 NOTE — Telephone Encounter (Signed)
Last seen 09/05/18   Dr Livia Snellen

## 2018-09-16 ENCOUNTER — Encounter: Payer: Self-pay | Admitting: Cardiology

## 2018-09-26 ENCOUNTER — Encounter: Payer: Self-pay | Admitting: Family Medicine

## 2018-09-26 ENCOUNTER — Ambulatory Visit (INDEPENDENT_AMBULATORY_CARE_PROVIDER_SITE_OTHER): Payer: Medicare Other | Admitting: Family Medicine

## 2018-09-26 VITALS — BP 190/73 | HR 73 | Temp 97.0°F | Ht 66.0 in | Wt 154.4 lb

## 2018-09-26 DIAGNOSIS — J40 Bronchitis, not specified as acute or chronic: Secondary | ICD-10-CM | POA: Diagnosis not present

## 2018-09-26 MED ORDER — BENZONATATE 100 MG PO CAPS: 100.0000 mg | ORAL_CAPSULE | Freq: Three times a day (TID) | ORAL | 0 refills | Status: DC | PRN

## 2018-09-26 MED ORDER — ALBUTEROL SULFATE HFA 108 (90 BASE) MCG/ACT IN AERS: 2.0000 | INHALATION_SPRAY | Freq: Four times a day (QID) | RESPIRATORY_TRACT | 0 refills | Status: DC | PRN

## 2018-09-26 MED ORDER — AMOXICILLIN 500 MG PO CAPS
500.0000 mg | ORAL_CAPSULE | Freq: Three times a day (TID) | ORAL | 0 refills | Status: DC
Start: 2018-09-26 — End: 2018-10-07

## 2018-09-26 NOTE — Progress Notes (Signed)
BP (!) 190/73   Pulse 73   Temp (!) 97 F (36.1 C) (Oral)   Ht 5' 6"  (1.676 m)   Wt 154 lb 6.4 oz (70 kg)   SpO2 97%   BMI 24.92 kg/m    Subjective:    [name], female    DOB: 03-17-1931, 84 y.o.   MRN: [id number]  HPI: [name] is a 84 y.o. female presenting on 09/26/2018 for Nasal Congestion (x 2 days); Cough; and Wheezing   HPI Cough and nasal congestion and wheezing Comes in complaining of 4 days of cough and congestion and 2 days worth of wheezing that is been worsening.  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  She says that she has had problems with asthma in the past and has been taking inhalers for it.  She says that her cough is been mostly nonproductive and that the wheezing and coughing has especially been worse at night and is keeping her daughter up because she is coughing so much.  She denies her having any fevers or chills or shortness of breath.  They have not used anything over-the-counter to help except Tylenol.  Relevant past medical, surgical, family and social history reviewed and updated as indicated. Interim medical history since our last visit reviewed. Allergies and medications reviewed and updated.  Review of Systems  Constitutional: Negative for chills and fever.  HENT: Positive for congestion, postnasal drip, rhinorrhea, sinus pressure, sneezing and sore throat. Negative for ear discharge and ear pain.   Eyes: Negative for visual disturbance.  Respiratory: Positive for cough and wheezing. Negative for chest tightness and shortness of breath.   Cardiovascular: Negative for chest pain and leg swelling.  Musculoskeletal: Negative for back pain and gait problem.  Skin: Negative for rash.  Neurological: Negative for light-headedness and headaches.  Psychiatric/Behavioral: Negative for agitation and behavioral problems.  All other systems reviewed and are  negative.   Per HPI unless specifically indicated above   Allergies as of 09/26/2018      Reactions   Advil [ibuprofen] Swelling   Heparin Other (See Comments)   Confusion   Propoxyphene N-acetaminophen Other (See Comments)   Numbness all over. "floating" sensation.      Medication List        Accurate as of 09/26/18 11:36 AM. Always use your most recent med list.          albuterol 108 (90 Base) MCG/ACT inhaler Commonly known as:  PROVENTIL HFA;VENTOLIN HFA Inhale 2 puffs into the lungs every 6 (six) hours as needed for wheezing or shortness of breath.   Alendronate Sodium 70 MG Tbef Mix 1 effervescent tablet with at least 4 ounces of water and allow to dissolve completely (about 10 min). Then drink entire mixture once a WEEK.  Drink prior to any food and do not eat, drink anything other than water or lie down for 30 minutes after drinking mixture.   amLODipine 10 MG tablet Commonly known as:  NORVASC TAKE (1) TABLET BY MOUTH ONCE DAILY.   amoxicillin 500 MG capsule Commonly known as:  AMOXIL Take 1 capsule (500 mg total) by mouth 3 (three) times daily.   aspirin 81 MG chewable tablet Chew 81 mg by mouth at bedtime.   atorvastatin 20 MG tablet Commonly known as:  LIPITOR Take 1 tablet (20 mg total) by mouth at bedtime.   benzonatate 100 MG capsule Commonly  known as:  TESSALON Take 1 capsule (100 mg total) by mouth 3 (three) times daily as needed for cough.   blood glucose meter kit and supplies Kit Dispense based on insurance coverage. Use 4 times a day as directed Dx: E11.9, Z79.4   clopidogrel 75 MG tablet Commonly known as:  PLAVIX TAKE (1) TABLET BY MOUTH ONCE DAILY.   DAILY VITAMINS/IRON/BETA CAROT PO Take 1 tablet by mouth at bedtime.   DRY EYES OP Apply 1 drop to eye daily as needed (dry/red eyes).   esomeprazole 40 MG capsule Commonly known as:  NEXIUM Take 1 capsule (40 mg total) by mouth daily.   ferrous sulfate 325 (65 FE) MG tablet TAKE  1 TABLET 3 TIMES A DAY AFTER MEALS.   FORA LANCETS Misc Test blood sugars twice daily   furosemide 20 MG tablet Commonly known as:  LASIX Take 1 tablet (20 mg total) by mouth daily as needed for fluid or edema.   glucose blood test strip Use to check blood glucose twice a day   hydrocortisone 25 MG suppository Commonly known as:  ANUSOL-HC Place 1 suppository (25 mg total) rectally 2 (two) times daily.   Insulin Detemir 100 UNIT/ML Pen Commonly known as:  LEVEMIR INJECT 22-28 UNITS INTO THE SKIN DAILY.   insulin lispro 100 UNIT/ML KiwkPen Commonly known as:  HUMALOG Inject 0.04-0.1 mLs (4-10 Units total) into the skin daily as needed (high blood sugar).   Insulin Pen Needle 31G X 8 MM Misc AS DIRECTED   Insulin Syringe-Needle U-100 31G X 5/16" 1 ML Misc USE AS DIRECTED.   isosorbide mononitrate 30 MG 24 hr tablet Commonly known as:  IMDUR Take 1 tablet (30 mg total) by mouth daily.   lisinopril 20 MG tablet Commonly known as:  PRINIVIL,ZESTRIL TAKE 1 TABLET DAILY.   loratadine 10 MG tablet Commonly known as:  CLARITIN Take 1 tablet (10 mg total) by mouth daily.   LORazepam 0.5 MG tablet Commonly known as:  ATIVAN TAKE 1 TABLET BY MOUTH TWICE DAILY AS NEEDED FOR ANXIETY.   meclizine 25 MG tablet Commonly known as:  ANTIVERT TAKE 1 TABLET TWICE DAILY AS NEEDED FOR DIZZINESS.   metFORMIN 500 MG 24 hr tablet Commonly known as:  GLUCOPHAGE-XR TAKE 1 TABLET WITH BREAKFAST AND 1 TABLET WITH SUPPER.   methenamine 1 g tablet Commonly known as:  HIPREX Take 1 tablet (1 g total) by mouth 2 (two) times daily.   metoprolol tartrate 50 MG tablet Commonly known as:  LOPRESSOR TAKE (1) TABLET TWICE DAILY.   mupirocin ointment 2 % Commonly known as:  BACTROBAN Apply 1 application topically 2 (two) times daily.   nitroGLYCERIN 0.4 MG SL tablet Commonly known as:  NITROSTAT PLACE ONE (1) TABLET UNDER TONGUE EVERY 5 MINUTES UP TO (3) DOSES AS NEEDED FOR CHEST PAIN.    PARoxetine 20 MG tablet Commonly known as:  PAXIL TAKE (1) TABLET BY MOUTH ONCE DAILY.   polyethylene glycol packet Commonly known as:  MIRALAX / GLYCOLAX Take 17 g by mouth daily.          Objective:    BP (!) 190/73   Pulse 73   Temp (!) 97 F (36.1 C) (Oral)   Ht 5' 6"  (1.676 m)   Wt 154 lb 6.4 oz (70 kg)   SpO2 97%   BMI 24.92 kg/m   Wt Readings from Last 3 Encounters:  09/26/18 154 lb 6.4 oz (70 kg)  08/09/18 151 lb (68.5 kg)  08/02/18  147 lb 8 oz (66.9 kg)    Physical Exam  Constitutional: She is oriented to person, place, and time. She appears well-developed and well-nourished. No distress.  HENT:  Right Ear: Tympanic membrane, external ear and ear canal normal.  Left Ear: Tympanic membrane, external ear and ear canal normal.  Nose: Mucosal edema and rhinorrhea present. No epistaxis. Right sinus exhibits no maxillary sinus tenderness and no frontal sinus tenderness. Left sinus exhibits no maxillary sinus tenderness and no frontal sinus tenderness.  Mouth/Throat: Uvula is midline and mucous membranes are normal. Posterior oropharyngeal edema and posterior oropharyngeal erythema present. No oropharyngeal exudate or tonsillar abscesses.  Eyes: Conjunctivae are normal.  Cardiovascular: Normal rate, regular rhythm and intact distal pulses.  Murmur (Holosystolic grade 4 out of 6 murmur heard loudest in the upper right second intercostal space) heard. Pulmonary/Chest: Effort normal and breath sounds normal. No stridor. No respiratory distress. She has no wheezes. She has no rales.  Musculoskeletal: Normal range of motion. She exhibits no edema or tenderness.  Neurological: She is alert and oriented to person, place, and time. Coordination normal.  Skin: Skin is warm and dry. No rash noted. She is not diaphoretic.  Psychiatric: She has a normal mood and affect. Her behavior is normal.  Vitals reviewed.       Assessment & Plan:   Problem List Items Addressed This  Visit    None    Visit Diagnoses    Bronchitis    -  Primary   Relevant Medications   albuterol (PROVENTIL HFA;VENTOLIN HFA) 108 (90 Base) MCG/ACT inhaler   benzonatate (TESSALON PERLES) 100 MG capsule   amoxicillin (AMOXIL) 500 MG capsule       Follow up plan: Return if symptoms worsen or fail to improve.  Counseling provided for all of the vaccine components No orders of the defined types were placed in this encounter.   Caryl Pina, MD [facility] 09/26/2018, 11:36 AM

## 2018-10-01 ENCOUNTER — Encounter: Payer: Self-pay | Admitting: Nurse Practitioner

## 2018-10-01 ENCOUNTER — Ambulatory Visit (INDEPENDENT_AMBULATORY_CARE_PROVIDER_SITE_OTHER): Payer: Medicare Other | Admitting: Nurse Practitioner

## 2018-10-01 VITALS — BP 159/72 | HR 71 | Temp 97.1°F | Ht 66.0 in | Wt 157.0 lb

## 2018-10-01 DIAGNOSIS — J40 Bronchitis, not specified as acute or chronic: Secondary | ICD-10-CM | POA: Diagnosis not present

## 2018-10-01 MED ORDER — BUDESONIDE-FORMOTEROL FUMARATE 80-4.5 MCG/ACT IN AERO: 2.0000 | INHALATION_SPRAY | Freq: Two times a day (BID) | RESPIRATORY_TRACT | 3 refills | Status: DC

## 2018-10-01 MED ORDER — AZITHROMYCIN 250 MG PO TABS: ORAL_TABLET | ORAL | 0 refills | Status: DC

## 2018-10-01 NOTE — Progress Notes (Signed)
   Subjective:    [name], female    DOB: 1931/05/27, 84 y.o.   MRN: [id number]   Chief Complaint: URI (no better)   HPI Patient brought in by daughter with c/o cough and wheezing. Seems to be worse at night. She has been sick for a week. She was seen Monday by Dr. Warrick Parisian and he dx her with bronchitis and was given albuterol inhaler to use as needed.   Review of Systems  Constitutional: Negative for chills and fever.  HENT: Positive for congestion and rhinorrhea. Negative for sore throat and trouble swallowing.   Respiratory: Positive for cough.   Cardiovascular: Negative.   Genitourinary: Negative.   Neurological: Negative.   Psychiatric/Behavioral: Negative.   All other systems reviewed and are negative.      Objective:   Physical Exam  Constitutional: She is oriented to person, place, and time. She appears well-developed and well-nourished. No distress.  HENT:  Right Ear: Hearing, tympanic membrane, external ear and ear canal normal.  Left Ear: Hearing, tympanic membrane, external ear and ear canal normal.  Nose: Mucosal edema and rhinorrhea present. Right sinus exhibits no maxillary sinus tenderness and no frontal sinus tenderness. Left sinus exhibits no maxillary sinus tenderness and no frontal sinus tenderness.  Mouth/Throat: Uvula is midline, oropharynx is clear and moist and mucous membranes are normal.  Cardiovascular: Normal rate and regular rhythm.  Murmur (3/6 systolic murmur) heard. Pulmonary/Chest: Effort normal. She has rales (right lower lobe).  Neurological: She is alert and oriented to person, place, and time.  Skin: Skin is warm.  Psychiatric: She has a normal mood and affect. Her behavior is normal. Thought content normal.  Nursing note and vitals reviewed.  BP (!) 159/72   Pulse 71   Temp (!) 97.1 F (36.2 C) (Oral)   Ht 5\' 6"  (1.676 m)   Wt 157 lb (71.2 kg)   BMI 25.34 kg/m         Assessment & Plan:  [name]  in today with chief complaint of URI (no better)   1. Bronchitis 1. Take meds as prescribed 2. Use a cool mist humidifier especially during the winter months and when heat has been humid. 3. Use saline nose sprays frequently 4. Saline irrigations of the nose can be very helpful if done frequently.  * 4X daily for 1 week*  * Use of a nettie pot can be helpful with this. Follow directions with this* 5. Drink plenty of fluids 6. Keep thermostat turn down low 7.For any cough or congestion  Use plain Mucinex- regular strength or max strength is fine   * Children- consult with Pharmacist for dosing 8. For fever or aces or pains- take tylenol or ibuprofen appropriate for age and weight.  * for fevers greater than 101 orally you may alternate ibuprofen and tylenol every  3 hours.    - azithromycin (ZITHROMAX Z-PAK) 250 MG tablet; As directed  Dispense: 6 tablet; Refill: 0 - budesonide-formoterol (SYMBICORT) 80-4.5 MCG/ACT inhaler; Inhale 2 puffs into the lungs 2 (two) times daily.  Dispense: 1 Inhaler; Refill: [place], FNP

## 2018-10-01 NOTE — Patient Instructions (Signed)

## 2018-10-03 ENCOUNTER — Other Ambulatory Visit: Payer: Self-pay | Admitting: Family Medicine

## 2018-10-03 DIAGNOSIS — I10 Essential (primary) hypertension: Secondary | ICD-10-CM

## 2018-10-03 NOTE — Telephone Encounter (Signed)
Last seen 10/01/18 MMM  Dr Livia Snellen PCP

## 2018-10-07 ENCOUNTER — Encounter (HOSPITAL_COMMUNITY): Payer: Self-pay | Admitting: Emergency Medicine

## 2018-10-07 ENCOUNTER — Ambulatory Visit (INDEPENDENT_AMBULATORY_CARE_PROVIDER_SITE_OTHER): Payer: Medicare Other

## 2018-10-07 ENCOUNTER — Encounter: Payer: Self-pay | Admitting: Pediatrics

## 2018-10-07 ENCOUNTER — Other Ambulatory Visit: Payer: Self-pay

## 2018-10-07 ENCOUNTER — Inpatient Hospital Stay (HOSPITAL_COMMUNITY)
Admission: EM | Admit: 2018-10-07 | Discharge: 2018-10-10 | DRG: 291 | Disposition: A | Discharge status: Home Health | Payer: Medicare Other | Attending: Nephrology | Admitting: Nephrology

## 2018-10-07 ENCOUNTER — Ambulatory Visit (INDEPENDENT_AMBULATORY_CARE_PROVIDER_SITE_OTHER): Payer: Medicare Other | Admitting: Pediatrics

## 2018-10-07 VITALS — BP 140/67 | HR 57 | Temp 97.8°F | Ht 66.0 in | Wt 158.0 lb

## 2018-10-07 DIAGNOSIS — I10 Essential (primary) hypertension: Secondary | ICD-10-CM

## 2018-10-07 DIAGNOSIS — I639 Cerebral infarction, unspecified: Secondary | ICD-10-CM

## 2018-10-07 DIAGNOSIS — Z888 Allergy status to other drugs, medicaments and biological substances status: Secondary | ICD-10-CM

## 2018-10-07 DIAGNOSIS — E1122 Type 2 diabetes mellitus with diabetic chronic kidney disease: Secondary | ICD-10-CM | POA: Diagnosis present

## 2018-10-07 DIAGNOSIS — R5381 Other malaise: Secondary | ICD-10-CM

## 2018-10-07 DIAGNOSIS — F329 Major depressive disorder, single episode, unspecified: Secondary | ICD-10-CM | POA: Diagnosis present

## 2018-10-07 DIAGNOSIS — H5462 Unqualified visual loss, left eye, normal vision right eye: Secondary | ICD-10-CM | POA: Diagnosis not present

## 2018-10-07 DIAGNOSIS — E785 Hyperlipidemia, unspecified: Secondary | ICD-10-CM | POA: Diagnosis not present

## 2018-10-07 DIAGNOSIS — I350 Nonrheumatic aortic (valve) stenosis: Secondary | ICD-10-CM | POA: Diagnosis not present

## 2018-10-07 DIAGNOSIS — I130 Hypertensive heart and chronic kidney disease with heart failure and stage 1 through stage 4 chronic kidney disease, or unspecified chronic kidney disease: Secondary | ICD-10-CM | POA: Diagnosis not present

## 2018-10-07 DIAGNOSIS — N183 Chronic kidney disease, stage 3 unspecified: Secondary | ICD-10-CM | POA: Diagnosis present

## 2018-10-07 DIAGNOSIS — Z7902 Long term (current) use of antithrombotics/antiplatelets: Secondary | ICD-10-CM

## 2018-10-07 DIAGNOSIS — Z8249 Family history of ischemic heart disease and other diseases of the circulatory system: Secondary | ICD-10-CM

## 2018-10-07 DIAGNOSIS — I050 Rheumatic mitral stenosis: Secondary | ICD-10-CM | POA: Diagnosis not present

## 2018-10-07 DIAGNOSIS — I110 Hypertensive heart disease with heart failure: Secondary | ICD-10-CM | POA: Diagnosis not present

## 2018-10-07 DIAGNOSIS — I509 Heart failure, unspecified: Secondary | ICD-10-CM

## 2018-10-07 DIAGNOSIS — D631 Anemia in chronic kidney disease: Secondary | ICD-10-CM | POA: Diagnosis not present

## 2018-10-07 DIAGNOSIS — J9601 Acute respiratory failure with hypoxia: Secondary | ICD-10-CM | POA: Diagnosis not present

## 2018-10-07 DIAGNOSIS — K5900 Constipation, unspecified: Secondary | ICD-10-CM

## 2018-10-07 DIAGNOSIS — I5030 Unspecified diastolic (congestive) heart failure: Secondary | ICD-10-CM | POA: Diagnosis not present

## 2018-10-07 DIAGNOSIS — Z9111 Patient's noncompliance with dietary regimen: Secondary | ICD-10-CM

## 2018-10-07 DIAGNOSIS — Z8673 Personal history of transient ischemic attack (TIA), and cerebral infarction without residual deficits: Secondary | ICD-10-CM

## 2018-10-07 DIAGNOSIS — I080 Rheumatic disorders of both mitral and aortic valves: Secondary | ICD-10-CM | POA: Diagnosis present

## 2018-10-07 DIAGNOSIS — D649 Anemia, unspecified: Secondary | ICD-10-CM

## 2018-10-07 DIAGNOSIS — K219 Gastro-esophageal reflux disease without esophagitis: Secondary | ICD-10-CM | POA: Diagnosis not present

## 2018-10-07 DIAGNOSIS — M1990 Unspecified osteoarthritis, unspecified site: Secondary | ICD-10-CM | POA: Diagnosis present

## 2018-10-07 DIAGNOSIS — Z96651 Presence of right artificial knee joint: Secondary | ICD-10-CM | POA: Diagnosis present

## 2018-10-07 DIAGNOSIS — I252 Old myocardial infarction: Secondary | ICD-10-CM

## 2018-10-07 DIAGNOSIS — J45909 Unspecified asthma, uncomplicated: Secondary | ICD-10-CM | POA: Diagnosis not present

## 2018-10-07 DIAGNOSIS — E119 Type 2 diabetes mellitus without complications: Secondary | ICD-10-CM

## 2018-10-07 DIAGNOSIS — F419 Anxiety disorder, unspecified: Secondary | ICD-10-CM | POA: Diagnosis present

## 2018-10-07 DIAGNOSIS — Z87440 Personal history of urinary (tract) infections: Secondary | ICD-10-CM

## 2018-10-07 DIAGNOSIS — Z794 Long term (current) use of insulin: Secondary | ICD-10-CM

## 2018-10-07 DIAGNOSIS — Z955 Presence of coronary angioplasty implant and graft: Secondary | ICD-10-CM | POA: Diagnosis not present

## 2018-10-07 DIAGNOSIS — Z951 Presence of aortocoronary bypass graft: Secondary | ICD-10-CM | POA: Diagnosis not present

## 2018-10-07 DIAGNOSIS — K5909 Other constipation: Secondary | ICD-10-CM | POA: Diagnosis present

## 2018-10-07 DIAGNOSIS — R0602 Shortness of breath: Secondary | ICD-10-CM | POA: Diagnosis not present

## 2018-10-07 DIAGNOSIS — Z7982 Long term (current) use of aspirin: Secondary | ICD-10-CM

## 2018-10-07 DIAGNOSIS — J90 Pleural effusion, not elsewhere classified: Secondary | ICD-10-CM | POA: Diagnosis not present

## 2018-10-07 DIAGNOSIS — I2510 Atherosclerotic heart disease of native coronary artery without angina pectoris: Secondary | ICD-10-CM | POA: Diagnosis not present

## 2018-10-07 DIAGNOSIS — I5033 Acute on chronic diastolic (congestive) heart failure: Secondary | ICD-10-CM | POA: Diagnosis not present

## 2018-10-07 DIAGNOSIS — F0390 Unspecified dementia without behavioral disturbance: Secondary | ICD-10-CM | POA: Diagnosis present

## 2018-10-07 DIAGNOSIS — F32A Depression, unspecified: Secondary | ICD-10-CM | POA: Diagnosis present

## 2018-10-07 DIAGNOSIS — Z833 Family history of diabetes mellitus: Secondary | ICD-10-CM | POA: Diagnosis not present

## 2018-10-07 DIAGNOSIS — Z9114 Patient's other noncompliance with medication regimen: Secondary | ICD-10-CM

## 2018-10-07 DIAGNOSIS — Z7951 Long term (current) use of inhaled steroids: Secondary | ICD-10-CM

## 2018-10-07 DIAGNOSIS — Z79899 Other long term (current) drug therapy: Secondary | ICD-10-CM

## 2018-10-07 LAB — BASIC METABOLIC PANEL
Anion gap: 8 (ref 5–15)
BUN: 30 mg/dL — ABNORMAL HIGH (ref 8–23)
CHLORIDE: 106 mmol/L (ref 98–111)
CO2: 24 mmol/L (ref 22–32)
CREATININE: 1.23 mg/dL — AB (ref 0.44–1.00)
Calcium: 9 mg/dL (ref 8.9–10.3)
GFR, EST AFRICAN AMERICAN: 45 mL/min — AB (ref >60)
GFR, EST NON AFRICAN AMERICAN: 39 mL/min — AB (ref >60)
Glucose, Bld: 225 mg/dL — ABNORMAL HIGH (ref 70–99)
POTASSIUM: 3.6 mmol/L (ref 3.5–5.1)
Sodium: 138 mmol/L (ref 135–145)

## 2018-10-07 LAB — CBC WITH DIFFERENTIAL/PLATELET
Abs Immature Granulocytes: 0.03 10*3/uL (ref 0.00–0.07)
BASOS ABS: 0 10*3/uL (ref 0.0–0.1)
BASOS PCT: 1 %
EOS PCT: 2 %
Eosinophils Absolute: 0.1 10*3/uL (ref 0.0–0.5)
HCT: 31.2 % — ABNORMAL LOW (ref 36.0–46.0)
Hemoglobin: 9.8 g/dL — ABNORMAL LOW (ref 12.0–15.0)
Immature Granulocytes: 0 %
LYMPHS PCT: 16 %
Lymphs Abs: 1.1 10*3/uL (ref 0.7–4.0)
MCH: 28.2 pg (ref 26.0–34.0)
MCHC: 31.4 g/dL (ref 30.0–36.0)
MCV: 89.9 fL (ref 80.0–100.0)
MONO ABS: 0.6 10*3/uL (ref 0.1–1.0)
Monocytes Relative: 9 %
NRBC: 0 % (ref 0.0–0.2)
Neutro Abs: 4.9 10*3/uL (ref 1.7–7.7)
Neutrophils Relative %: 72 %
PLATELETS: 253 10*3/uL (ref 150–400)
RBC: 3.47 MIL/uL — AB (ref 3.87–5.11)
RDW: 14.3 % (ref 11.5–15.5)
WBC: 6.8 10*3/uL (ref 4.0–10.5)

## 2018-10-07 LAB — BRAIN NATRIURETIC PEPTIDE: B NATRIURETIC PEPTIDE 5: 581 pg/mL — AB (ref 0.0–100.0)

## 2018-10-07 LAB — TROPONIN I: Troponin I: <0.03 ng/mL (ref <0.03)

## 2018-10-07 MED ORDER — NITROGLYCERIN 0.4 MG SL SUBL: 0.4000 mg | SUBLINGUAL_TABLET | SUBLINGUAL | Status: DC | PRN

## 2018-10-07 MED ORDER — FUROSEMIDE 10 MG/ML IJ SOLN
20.0000 mg | Freq: Once | INTRAMUSCULAR | Status: AC
  Administered 2018-10-07: 20 mg via INTRAVENOUS
  Filled 2018-10-07: qty 2

## 2018-10-07 MED ORDER — POLYETHYLENE GLYCOL 3350 17 G PO PACK: 17.0000 g | PACK | Freq: Every day | ORAL | Status: DC | PRN

## 2018-10-07 MED ORDER — ASPIRIN 81 MG PO CHEW
81.0000 mg | CHEWABLE_TABLET | Freq: Every day | ORAL | Status: DC
  Administered 2018-10-07 – 2018-10-09 (×3): 81 mg via ORAL
  Filled 2018-10-07 (×3): qty 1

## 2018-10-07 MED ORDER — FUROSEMIDE 10 MG/ML IJ SOLN
40.0000 mg | Freq: Two times a day (BID) | INTRAMUSCULAR | Status: DC
  Administered 2018-10-08: 40 mg via INTRAVENOUS
  Filled 2018-10-07 (×2): qty 4

## 2018-10-07 MED ORDER — METOPROLOL TARTRATE 50 MG PO TABS
50.0000 mg | ORAL_TABLET | Freq: Two times a day (BID) | ORAL | Status: DC
  Administered 2018-10-07 – 2018-10-10 (×6): 50 mg via ORAL
  Filled 2018-10-07 (×6): qty 1

## 2018-10-07 MED ORDER — ALBUTEROL SULFATE (2.5 MG/3ML) 0.083% IN NEBU: 3.0000 mL | INHALATION_SOLUTION | Freq: Four times a day (QID) | RESPIRATORY_TRACT | Status: DC | PRN

## 2018-10-07 MED ORDER — DM-GUAIFENESIN ER 30-600 MG PO TB12
1.0000 | ORAL_TABLET | Freq: Two times a day (BID) | ORAL | Status: DC
  Administered 2018-10-07 – 2018-10-10 (×6): 1 via ORAL
  Filled 2018-10-07 (×6): qty 1

## 2018-10-07 MED ORDER — PAROXETINE HCL 20 MG PO TABS
20.0000 mg | ORAL_TABLET | Freq: Every morning | ORAL | Status: DC
  Administered 2018-10-08 – 2018-10-10 (×3): 20 mg via ORAL
  Filled 2018-10-07 (×3): qty 1

## 2018-10-07 MED ORDER — ENOXAPARIN SODIUM 40 MG/0.4ML ~~LOC~~ SOLN: 40.0000 mg | SUBCUTANEOUS | Status: DC

## 2018-10-07 MED ORDER — FERROUS SULFATE 325 (65 FE) MG PO TABS
325.0000 mg | ORAL_TABLET | Freq: Every day | ORAL | Status: DC
  Administered 2018-10-08 – 2018-10-10 (×3): 325 mg via ORAL
  Filled 2018-10-07 (×3): qty 1

## 2018-10-07 MED ORDER — PANTOPRAZOLE SODIUM 40 MG PO TBEC
40.0000 mg | DELAYED_RELEASE_TABLET | Freq: Every day | ORAL | Status: DC
  Administered 2018-10-08 – 2018-10-10 (×3): 40 mg via ORAL
  Filled 2018-10-07 (×3): qty 1

## 2018-10-07 MED ORDER — METHENAMINE HIPPURATE 1 G PO TABS
1.0000 g | ORAL_TABLET | Freq: Two times a day (BID) | ORAL | Status: DC
  Administered 2018-10-09 – 2018-10-10 (×3): 1 g via ORAL
  Filled 2018-10-07 (×10): qty 1

## 2018-10-07 MED ORDER — INSULIN ASPART 100 UNIT/ML ~~LOC~~ SOLN
0.0000 [IU] | Freq: Three times a day (TID) | SUBCUTANEOUS | Status: DC
  Administered 2018-10-08 – 2018-10-10 (×3): 2 [IU] via SUBCUTANEOUS
  Administered 2018-10-10: 1 [IU] via SUBCUTANEOUS

## 2018-10-07 MED ORDER — ATORVASTATIN CALCIUM 20 MG PO TABS
20.0000 mg | ORAL_TABLET | Freq: Every day | ORAL | Status: DC
  Administered 2018-10-07 – 2018-10-09 (×3): 20 mg via ORAL
  Filled 2018-10-07 (×3): qty 1

## 2018-10-07 MED ORDER — CLOPIDOGREL BISULFATE 75 MG PO TABS
75.0000 mg | ORAL_TABLET | Freq: Every morning | ORAL | Status: DC
  Administered 2018-10-08 – 2018-10-10 (×3): 75 mg via ORAL
  Filled 2018-10-07 (×3): qty 1

## 2018-10-07 MED ORDER — INSULIN DETEMIR 100 UNIT/ML ~~LOC~~ SOLN
24.0000 [IU] | Freq: Every morning | SUBCUTANEOUS | Status: DC
  Administered 2018-10-08 – 2018-10-10 (×3): 24 [IU] via SUBCUTANEOUS
  Filled 2018-10-07 (×4): qty 0.24

## 2018-10-07 MED ORDER — HYDROCORTISONE ACETATE 25 MG RE SUPP: 25.0000 mg | Freq: Two times a day (BID) | RECTAL | Status: DC | PRN

## 2018-10-07 MED ORDER — MOMETASONE FURO-FORMOTEROL FUM 100-5 MCG/ACT IN AERO
2.0000 | INHALATION_SPRAY | Freq: Two times a day (BID) | RESPIRATORY_TRACT | Status: DC
  Administered 2018-10-08 – 2018-10-10 (×5): 2 via RESPIRATORY_TRACT
  Filled 2018-10-07: qty 8.8

## 2018-10-07 MED ORDER — ALBUTEROL SULFATE (2.5 MG/3ML) 0.083% IN NEBU: 5.0000 mg | INHALATION_SOLUTION | Freq: Once | RESPIRATORY_TRACT | Status: DC

## 2018-10-07 NOTE — ED Notes (Signed)
Per lab, results for BNP and Troponin will be available in approx 15 mins.

## 2018-10-07 NOTE — Progress Notes (Signed)
  Subjective:   [name], female    DOB: [birth year], 84 y.o.   MRN: [id number] CC: Shortness of Breath and Leg Swelling  HPI: [name] is a 84 y.o. female   Here today with daughter.  Seen twice in the last 2 weeks for cough.  Treated first with amoxicillin, then with azithromycin.  Cough is not bothering her as much now.  Feeling very short of breath and weak.  This morning daughter noticed that her legs are quite swollen.  She does have swelling intermittently in her legs.  She takes Lasix as needed.  She has not been taking it for several weeks because she has not had swelling.  She feels pressure in her chest when she lays flat.  She is feeling more weak than usual.  She does walk with a walker.  She is a hard time catching her breath.  Last echo in 02/2015, showed mitral valve calcification, mild stenosis.  Normal ejection fraction, grade 2 diastolic dysfunction.  Relevant past medical, surgical, family and social history reviewed. Allergies and medications reviewed and updated. Social History   Tobacco Use  Smoking Status Never Smoker  Smokeless Tobacco Never Used   ROS: Per HPI   Objective:    BP 140/67   Pulse (!) 57   Temp 97.8 F (36.6 C) (Oral)   Ht 5\' 6"  (1.676 m)   Wt 158 lb (71.7 kg)   SpO2 93%   BMI 25.50 kg/m   Wt Readings from Last 3 Encounters:  10/07/18 158 lb (71.7 kg)  10/01/18 157 lb (71.2 kg)  09/26/18 154 lb 6.4 oz (70 kg)    Gen: NAD, alert, cooperative with exam, NCAT EYES: EOMI, no conjunctival injection, or no icterus ENT: OP without erythema LYMPH: no cervical LAD CV: NRRR, normal S9/G2, systolic ejection murmur at the upper sternal borders, distal pulses 2+ b/l Resp: CTABL, no wheezes, normal WOB Abd: +BS, soft, NTND.  Ext:  1-2 + pitting edema, warm Neuro: Alert, walks with walker, dysarthric MSK: normal muscle bulk  Preliminary read by Assunta Found, MD:  Bilateral pleural effusions, enlarged cardiac silhouette  compared to chest x-ray from 11/2017  Assessment & Plan:  [name] was seen today for shortness of breath and leg swelling.  Diagnoses and all orders for this visit:  SOB (shortness of breath) Concern for acute CHF exacerbation.  Oxygen level slightly low.  No swelling, bilateral effusions, cardiomegaly.  Due to acute onset, will refer to emergency room.  Needs further evaluation, likely echo, diuresis. -     DG Chest 2 View; Future  Pleural effusion New.   Follow up plan: Within the week Assunta Found, MD [place]

## 2018-10-07 NOTE — H&P (Addendum)
History and Physical    ROCQUEL ASKREN QPR:[id number] DOB: [birth year] DOA: 10/07/2018  PCP: [name], MD Patient coming from: PCPs office   Chief Complaint: SOB  HPI: NHYLA NAPPI is a 84 y.o. female with medical history significant of asthma, coronary artery disease status post CABG, chronic diastolic congestive heart failure, CKD 3, CVA, type 2 diabetes, hypertension, anemia presenting to the hospital for evaluation of shortness of breath.  Patient reports having orthopnea for the past 2 weeks.  Family at bedside states they have noticed her legs have been swollen for the past 2 to 3 days.  She has been having a nonproductive cough especially at night.  Patient reports having chest discomfort when she is supine which improves with sitting up.  Family states that her home Lasix is as needed and not a daily prescription.  Patient has not taken Lasix in the past 2 weeks.  Per family, she eats a lot of salt and they have noticed that she has been drinking a lot of fluid the past few days. She does not use home oxygen.    Patient was seen at [facility] today for worsening shortness of breath and lower extremity edema in the setting of patient not taking Lasix for several weeks.   ED Course: Afebrile, tachypneic, and SPO2 85% on room air.  No leukocytosis.  BNP 581.  I-STAT troponin negative and EKG with nonspecific ST changes.  Chest x-ray showing new bibasilar pleural effusions and atelectasis.  Patient received IV Lasix 20 mg in the ED.  TRH paged to admit.  Review of Systems: As per HPI otherwise 10 point review of systems negative.  Past Medical History:  Diagnosis Date  . Anemia   . Anxiety   . Arthritis    back, knees, and hips  . Asthma    with allergies  . Balance problems   . CAD (coronary artery disease)   . Carotid stenosis   . CHF (congestive heart failure) ([place])    EF preserved Echo 2012  . 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 Dementia ([place])    "a little"  . Depression   . Diabetes mellitus    type 2  . GERD (gastroesophageal reflux disease)   . Hearing loss   . Hypertension   . Iron deficiency anemia 05/27/2011  . Myocardial infarction ([place])   . Renal insufficiency   . Scoliosis   . Shortness of breath dyspnea    with activity  . Vertigo    "when sugar gets low"  . Vision loss    left eye-"half blind"    Past Surgical History:  Procedure Laterality Date  . APPENDECTOMY     with hysterectomy  . CATARACT EXTRACTION Bilateral 5 years ago  . CHOLECYSTECTOMY    . CORONARY ANGIOPLASTY     prior to 2006 5 stents  . CORONARY ARTERY BYPASS GRAFT  2006  . I & D of [place]  April 2013  . KNEE SURGERY Right   . TOTAL ABDOMINAL HYSTERECTOMY  ~84 years old   complete, with tumor removal  . TOTAL KNEE ARTHROPLASTY Right 08/13/2015   Procedure: RIGHT  TOTAL KNEE ARTHROPLASTY;  Surgeon: Paralee Cancel, MD;  Location: [facility];  Service: Orthopedics;  Laterality: Right;     reports that she has never smoked. She has never used smokeless tobacco. She reports that she does  not drink alcohol or use drugs.  Allergies  Allergen Reactions  . Advil [Ibuprofen] Swelling  . Heparin Other (See Comments)    Confusion  . Propoxyphene N-Acetaminophen Other (See Comments)    Numbness all over. "floating" sensation.    Family History  Problem Relation Age of Onset  . Cancer Brother        porstate  . Early death Sister   . Heart disease Brother   . Heart disease Brother   . Heart attack Brother   . Heart disease Brother   . Heart attack Brother   . Diabetes Sister   . Heart attack Sister   . Diabetes Sister   . Osteoporosis Sister   . Hypertension Sister   . Heart attack Son   . Early death Son   . Pancreatitis Son   . Heart attack Daughter 61    Prior to Admission medications   Medication Sig Start Date End Date Taking?  Authorizing Provider  albuterol (PROVENTIL HFA;VENTOLIN HFA) 108 (90 Base) MCG/ACT inhaler Inhale 2 puffs into the lungs every 6 (six) hours as needed for wheezing or shortness of breath. 09/26/18  Yes Dettinger, Fransisca Kaufmann, MD  amLODipine (NORVASC) 10 MG tablet TAKE (1) TABLET BY MOUTH ONCE DAILY. Patient taking differently: Take 10 mg by mouth every morning.  07/12/18  Yes [name], MD  Artificial Tear Ointment (DRY EYES OP) Apply 1 drop to eye daily as needed (dry/red eyes).    Yes [provider]  aspirin 81 MG chewable tablet Chew 81 mg by mouth at bedtime. 08/04/10  Yes [provider]  atorvastatin (LIPITOR) 20 MG tablet Take 1 tablet (20 mg total) by mouth at bedtime. 12/30/17  Yes Timmothy Euler, MD  clopidogrel (PLAVIX) 75 MG tablet TAKE (1) TABLET BY MOUTH ONCE DAILY. Patient taking differently: Take 75 mg by mouth every morning.  08/01/18  Yes Stacks, Cletus Gash, MD  esomeprazole (NEXIUM) 40 MG capsule Take 1 capsule (40 mg total) by mouth daily. 07/12/18  Yes Stacks, Cletus Gash, MD  ferrous sulfate 325 (65 FE) MG tablet TAKE 1 TABLET 3 TIMES A DAY AFTER MEALS. Patient taking differently: Take 325 mg by mouth 3 (three) times daily with meals.  05/30/18  Yes Timmothy Euler, MD  furosemide (LASIX) 20 MG tablet Take 1 tablet (20 mg total) by mouth daily as needed for fluid or edema. 12/30/17  Yes Timmothy Euler, MD  hydrocortisone (ANUSOL-HC) 25 MG suppository Place 1 suppository (25 mg total) rectally 2 (two) times daily. Patient taking differently: Place 25 mg rectally 2 (two) times daily as needed for hemorrhoids or anal itching.  04/26/18  Yes Terald Sleeper, PA-C  Insulin Detemir (LEVEMIR FLEXTOUCH) 100 UNIT/ML Pen INJECT 22-28 UNITS INTO THE SKIN DAILY. Patient taking differently: Inject 24 Units into the skin every morning.  07/12/18  Yes Stacks, Cletus Gash, MD  insulin lispro (HUMALOG KWIKPEN) 100 UNIT/ML KiwkPen Inject 0.04-0.1 mLs (4-10 Units total) into the skin daily  as needed (high blood sugar). 07/12/18  Yes [name], MD  isosorbide mononitrate (IMDUR) 30 MG 24 hr tablet Take 1 tablet (30 mg total) by mouth daily. 04/05/18  Yes Timmothy Euler, MD  lisinopril (PRINIVIL,ZESTRIL) 20 MG tablet TAKE 1 TABLET DAILY. Patient taking differently: Take 20 mg by mouth every morning.  09/08/18  Yes [name], MD  meclizine (ANTIVERT) 25 MG tablet TAKE 1 TABLET TWICE DAILY AS NEEDED FOR DIZZINESS. Patient taking differently: Take 25 mg by mouth 2 (two)  times daily.  10/04/18  Yes Stacks, Cletus Gash, MD  metFORMIN (GLUCOPHAGE-XR) 500 MG 24 hr tablet TAKE 1 TABLET WITH BREAKFAST AND 1 TABLET WITH SUPPER. Patient taking differently: Take 500 mg by mouth every evening.  08/01/18  Yes Stacks, Cletus Gash, MD  methenamine (HIPREX) 1 g tablet Take 1 tablet (1 g total) by mouth 2 (two) times daily. 08/09/18  Yes Stacks, Cletus Gash, MD  metoprolol tartrate (LOPRESSOR) 50 MG tablet TAKE (1) TABLET TWICE DAILY. Patient taking differently: Take 50 mg by mouth 2 (two) times daily.  05/23/18  Yes Timmothy Euler, MD  Multiple Vitamins-Iron (DAILY VITAMINS/IRON/BETA CAROT PO) Take 1 tablet by mouth at bedtime.   Yes [provider]  nitroGLYCERIN (NITROSTAT) 0.4 MG SL tablet PLACE ONE (1) TABLET UNDER TONGUE EVERY 5 MINUTES UP TO (3) DOSES AS NEEDED FOR CHEST PAIN. Patient taking differently: Place 0.4 mg under the tongue every 5 (five) minutes as needed for chest pain.  04/07/18  Yes Timmothy Euler, MD  PARoxetine (PAXIL) 20 MG tablet TAKE (1) TABLET BY MOUTH ONCE DAILY. Patient taking differently: Take 20 mg by mouth every morning.  04/05/18  Yes Timmothy Euler, MD  polyethylene glycol [facility]) packet Take 17 g by mouth daily. Patient taking differently: Take 17 g by mouth daily as needed for mild constipation or moderate constipation.  07/25/18  Yes Maudie Flakes, MD  blood glucose meter kit and supplies KIT Dispense based on insurance coverage. Use 4 times a day as  directed Dx: E11.9, Z79.4 06/13/18   Timmothy Euler, MD  budesonide-formoterol (SYMBICORT) 80-4.5 MCG/ACT inhaler Inhale 2 puffs into the lungs 2 (two) times daily. 10/01/18   Chevis Pretty, FNP  FORA LANCETS MISC Test blood sugars twice daily 07/12/18   [name], MD  glucose blood (ACCU-CHEK AVIVA) test strip Use to check blood glucose twice a day 07/12/18   [name], MD  Insulin Pen Needle (GLOBAL EASE INJECT PEN NEEDLES) 31G X 8 MM MISC AS DIRECTED 07/12/18   [name], MD  Insulin Syringe-Needle U-100 (EASY TOUCH INSULIN SYRINGE) 31G X 5/16" 1 ML MISC USE AS DIRECTED. 07/12/18   [name], MD    Physical Exam: Vitals:   10/07/18 1900 10/07/18 1935 10/07/18 1938 10/07/18 2130  BP: (!) 157/70 (!) 159/96 (!) 146/71 (!) 149/69  Pulse: (!) 59     Resp: (!) _0 Temp:      TempSrc:      SpO2: 96%     Weight:      Height:        Physical Exam  Constitutional: No distress.  Resting comfortably in a hospital stretcher  HENT:  Mouth/Throat: Oropharynx is clear and moist.  Eyes: Right eye exhibits no discharge. Left eye exhibits no discharge.  Neck: Neck supple. No tracheal deviation present.  Cardiovascular: Normal rate, regular rhythm and intact distal pulses.  Pulmonary/Chest: Effort normal. She has no wheezes.  Decreased breath sounds at the bases. On 1.5 L supplemental oxygen via nasal cannula.  Abdominal: Soft. Bowel sounds are normal. She exhibits no distension. There is no tenderness.  Musculoskeletal: She exhibits edema.  +2 pitting edema of bilateral lower extremities Lower extremities appear symmetric in size.  No erythema, increased warmth, or tenderness.  Neurological:  Awake and alert Answering questions  Skin: Skin is warm and dry. She is not diaphoretic.    Labs on Admission: I have personally reviewed following labs and imaging studies  CBC: Recent Labs  Lab 10/07/18 1934  WBC 6.8  NEUTROABS 4.9  HGB 9.8*  HCT 31.2*  MCV  89.9  PLT 710   Basic Metabolic Panel: Recent Labs  Lab 10/07/18 1934  NA 138  K 3.6  CL 106  CO2 24  GLUCOSE 225*  BUN 30*  CREATININE 1.23*  CALCIUM 9.0   GFR: Estimated Creatinine Clearance: 34.5 mL/min (A) (by C-G formula based on SCr of 1.23 mg/dL (H)). Liver Function Tests: No results for input(s): AST, ALT, ALKPHOS, BILITOT, PROT, ALBUMIN in the last 168 hours. No results for input(s): LIPASE, AMYLASE in the last 168 hours. No results for input(s): AMMONIA in the last 168 hours. Coagulation Profile: No results for input(s): INR, PROTIME in the last 168 hours. Cardiac Enzymes: Recent Labs  Lab 10/07/18 1934  TROPONINI <0.03   BNP (last 3 results) No results for input(s): PROBNP in the last 8760 hours. HbA1C: No results for input(s): HGBA1C in the last 72 hours. CBG: No results for input(s): GLUCAP in the last 168 hours. Lipid Profile: No results for input(s): CHOL, HDL, LDLCALC, TRIG, CHOLHDL, LDLDIRECT in the last 72 hours. Thyroid Function Tests: No results for input(s): TSH, T4TOTAL, FREET4, T3FREE, THYROIDAB in the last 72 hours. Anemia Panel: No results for input(s): VITAMINB12, FOLATE, FERRITIN, TIBC, IRON, RETICCTPCT in the last 72 hours. Urine analysis:    Component Value Date/Time   COLORURINE YELLOW 04/29/2018 1702   APPEARANCEUR HAZY (A) 04/29/2018 1702   APPEARANCEUR Clear 09/27/2017 1056   LABSPEC 1.015 04/29/2018 1702   PHURINE 5.0 04/29/2018 1702   GLUCOSEU NEGATIVE 04/29/2018 1702   HGBUR SMALL (A) 04/29/2018 1702   BILIRUBINUR NEGATIVE 04/29/2018 1702   BILIRUBINUR Negative 09/27/2017 [place] 04/29/2018 1702   PROTEINUR 100 (A) 04/29/2018 1702   UROBILINOGEN negative 02/03/2016 1516   UROBILINOGEN 0.2 10/17/2015 1915   NITRITE NEGATIVE 04/29/2018 1702   LEUKOCYTESUR MODERATE (A) 04/29/2018 1702   LEUKOCYTESUR 2+ (A) 09/27/2017 1056    Radiological Exams on Admission: Dg Chest 2 View  Result Date:  10/07/2018 CLINICAL DATA:  Shortness of breath, asthma, stroke, CHF, COPD, dementia, diabetes mellitus, hypertension, coronary artery disease post MI EXAM: CHEST - 2 VIEW COMPARISON:  11/23/2017 FINDINGS: Enlargement of cardiac silhouette post CABG. Atherosclerotic calcification aorta. Mediastinal contours and pulmonary vascularity normal. Bibasilar effusions and atelectasis new since previous exam. Underlying emphysematous changes. No segmental consolidation or pneumothorax. Bones demineralized. Coronary arterial calcifications noted. Progressive compression deformity of a vertebra at the thoracolumbar junction is identified, IMPRESSION: Enlargement of cardiac silhouette post CABG with extensive atherosclerotic calcifications in the aorta and coronary arteries. Emphysematous changes with new bibasilar pleural effusions and atelectasis. Electronically Signed   By: Lavonia Dana M.D.   On: 10/07/2018 17:30    EKG: Independently reviewed.  Sinus rhythm (heart rate 66), LVH, and nonspecific ST changes.  Assessment/Plan Principal Problem:   Acute hypoxemic respiratory failure (HCC) Active Problems:   Type 2 diabetes mellitus with insulin therapy (HCC)   HLD (hyperlipidemia)   HTN (hypertension)   Depression   GERD (gastroesophageal reflux disease)   CKD (chronic kidney disease) stage 3, GFR 30-59 ml/min (HCC)   Acute exacerbation of CHF (congestive heart failure) (HCC)   Chronic anemia   Asthma   CVA (cerebral vascular accident) ([place])   CAD (coronary artery disease)   Constipation   Physical deconditioning   History of recurrent UTIs   Acute hypoxemic respiratory failure secondary to acute exacerbation of chronic diastolic congestive heart failure In the  setting of home Lasix nonadherence and dietary indiscretions.  Patient is presenting with a 2-week history of orthopnea and bilateral lower extremity edema for the past few days. SPO2 85% on room air.  Currently satting well on 1.5 L oxygen via  nasal cannula.  BNP 581. Chest x-ray showing new bibasilar pleural effusions and atelectasis.  Last echo from March 2016 showing normal systolic function (EF 60 to 65%) and grade 2 diastolic dysfunction. PE less likely as patient is not tachycardic; no clinical signs of lower extremity DVT on exam. -Monitor on telemetry -Patient received IV Lasix 20 mg in the ED this evening. Will give IV Lasix 40 mg q12 hrs starting early morning. -Echocardiogram -Supplemental oxygen as needed -Monitor intake and output, daily weights -Dietary fluid restriction -Monitor renal function; BMP in a.m.  Chronic anemia -Hemoglobin 9.8, was 11.6 two months ago.  -Continue home iron supplement -CBC in a.m.  Type 2 diabetes A1c 7.3 in August. -Recheck A1c -Continue home Levemir 24 units every morning -Sliding scale insulin sensitive -CBG checks -Hold home metformin  CKD 3 -Stable.  Creatinine 1.2, baseline 1.1-1.2. -Continue to monitor renal function with IV diuresis; BMP in a.m.  Asthma -Stable.  No bronchospasm. -Continue home inhalers  Hypertension -Systolic in the 917H at present.  Hold home amlodipine, Imdur, and lisinopril in the setting of IV diuresis.  Continue home metoprolol.  Coronary artery disease status post CABG Nuclear stress test done in July 2016 was low risk.  Patient reports having chest pain when supine, relieved with sitting up.  I-STAT troponin negative.  EKG with nonspecific ST changes. -Continue to trend troponin -Continue home aspirin and Plavix -Hold home Imdur, lisinopril at this time -Continue home metoprolol -Repeat EKG in a.m.  Hyperlipidemia -Continue home Lipitor  Prior CVA -Continue home aspirin and Plavix  GERD -Continue PPI  History of recurrent UTIs -Continue home Hiprex for prophylaxis  Depression -Continue home Paxil  Chronic constipation -Continue home Miralax prn  Physical deconditioning -PT eval  DVT prophylaxis: Lovenox Code Status:  Patient wishes to be full code. Family Communication: Daughters at bedside updated. Disposition Plan: Anticipate discharge to home in 1 to 2 days Consults called: None Admission status: Observation   Shela Leff MD [facility] Pager [phone]  If 7PM-7AM, please contact night-coverage [email or web address] Password [facility]  10/07/2018, 10:38 PM

## 2018-10-07 NOTE — ED Notes (Signed)
Report to Marissa, RN

## 2018-10-07 NOTE — ED Notes (Signed)
Call for report  [name] in room will call right back

## 2018-10-07 NOTE — ED Provider Notes (Addendum)
[facility] EMERGENCY DEPARTMENT Provider Note   CSN: [id number] Arrival date & time: 10/07/18  1839     History   Chief Complaint Chief Complaint  Patient presents with  . Shortness of Breath    HPI [name] is a 84 y.o. female.  HPI  84 year old female with a history of diastolic heart failure on PRN Lasix presents with shortness of breath.  Sent from PCP due to chest x-ray showing bilateral pleural effusions.  She is been having symptoms for about 2 weeks.  Started off as a likely viral infection.  She did have a fever at some point during this illness.  Was not getting better after initial supportive care treatments and was placed on a Z-Pak.  Still continues to have worsening shortness of breath and continued nonproductive cough.  Has been having progressive leg swelling.  Has not taken any Lasix during this time.  On and off chest pain.  Chest pain feels like a tightness at times.  She has noticed some abdominal distention as well.  On arrival her oxygen saturation is 85% and has come up with 2 L nasal cannula.  She does not wear oxygen at home.  Past Medical History:  Diagnosis Date  . Anemia   . Anxiety   . Arthritis    back, knees, and hips  . Asthma    with allergies  . Balance problems   . CAD (coronary artery disease)   . Carotid stenosis   . CHF (congestive heart failure) ([place])    EF preserved Echo 2012  . 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 Dementia ([place])    "a little"  . Depression   . Diabetes mellitus    type 2  . GERD (gastroesophageal reflux disease)   . Hearing loss   . Hypertension   . Iron deficiency anemia 05/27/2011  . Myocardial infarction ([place])   . Renal insufficiency   . Scoliosis   . Shortness of breath dyspnea    with activity  . Vertigo    "when sugar gets low"  . Vision loss    left eye-"half blind"    Patient Active Problem List   Diagnosis Date Noted  . CKD (chronic kidney disease) stage 3, GFR 30-59 ml/min (HCC) 12/22/2017  . Dyslipidemia 12/22/2017  . Weakness 11/23/2017  . Overweight (BMI 25.0-29.9) 05/08/2016  . UTI (lower urinary tract infection) 11/12/2015  . DM hyperosmolarity type II, uncontrolled ([place]) 11/12/2015  . Dementia ([place])   . Encephalopathy, metabolic 40/98/1191  . S/P right TKA 08/13/2015  . S/P knee replacement 08/13/2015  . Elevated troponin 03/06/2015  . Fever   . History of diabetes mellitus   . Left buttock pain   . Mixed hyperlipidemia 01/11/2015  . GAD (generalized anxiety disorder) 01/11/2015  . Depression 01/11/2015  . GERD (gastroesophageal reflux disease) 01/11/2015  . Osteopenia 07/13/2014  . Obesity (BMI 30-39.9) 06/19/2014  . Carotid stenosis   . Anemia of chronic disease 12/25/2011  . Iron deficiency anemia 05/27/2011  . Diastolic CHF, chronic ([place]) 04/02/2010  . Type 2 diabetes mellitus with insulin therapy ([place]) 10/08/2009  . DYSLIPIDEMIA 10/08/2009  . Essential hypertension 10/08/2009  . Coronary atherosclerosis 10/08/2009  . CAROTID STENOSIS 10/08/2009    Past Surgical History:  Procedure Laterality Date  . APPENDECTOMY     with hysterectomy  . CATARACT EXTRACTION Bilateral 5 years ago  . CHOLECYSTECTOMY    .  CORONARY ANGIOPLASTY     prior to 2006 5 stents  . CORONARY ARTERY BYPASS GRAFT  2006  . I & D of [place]  April 2013  . KNEE SURGERY Right   . TOTAL ABDOMINAL HYSTERECTOMY  ~84 years old   complete, with tumor removal  . TOTAL KNEE ARTHROPLASTY Right 08/13/2015   Procedure: RIGHT  TOTAL KNEE ARTHROPLASTY;  Surgeon: Paralee Cancel, MD;  Location: [facility] ORS;  Service: Orthopedics;  Laterality: Right;     OB History    Gravida  4   Para  4   Term  4   Preterm      AB      Living  3     SAB      TAB      Ectopic      Multiple      Live Births               Home Medications    Prior to Admission medications   Medication Sig  Start Date End Date Taking? Authorizing Provider  albuterol (PROVENTIL HFA;VENTOLIN HFA) 108 (90 Base) MCG/ACT inhaler Inhale 2 puffs into the lungs every 6 (six) hours as needed for wheezing or shortness of breath. 09/26/18  Yes Dettinger, Fransisca Kaufmann, MD  amLODipine (NORVASC) 10 MG tablet TAKE (1) TABLET BY MOUTH ONCE DAILY. Patient taking differently: Take 10 mg by mouth every morning.  07/12/18  Yes [name], MD  Artificial Tear Ointment (DRY EYES OP) Apply 1 drop to eye daily as needed (dry/red eyes).    Yes [provider]  aspirin 81 MG chewable tablet Chew 81 mg by mouth at bedtime. 08/04/10  Yes [provider]  atorvastatin (LIPITOR) 20 MG tablet Take 1 tablet (20 mg total) by mouth at bedtime. 12/30/17  Yes Timmothy Euler, MD  clopidogrel (PLAVIX) 75 MG tablet TAKE (1) TABLET BY MOUTH ONCE DAILY. Patient taking differently: Take 75 mg by mouth every morning.  08/01/18  Yes Stacks, Cletus Gash, MD  esomeprazole (NEXIUM) 40 MG capsule Take 1 capsule (40 mg total) by mouth daily. 07/12/18  Yes Stacks, Cletus Gash, MD  ferrous sulfate 325 (65 FE) MG tablet TAKE 1 TABLET 3 TIMES A DAY AFTER MEALS. Patient taking differently: Take 325 mg by mouth 3 (three) times daily with meals.  05/30/18  Yes Timmothy Euler, MD  furosemide (LASIX) 20 MG tablet Take 1 tablet (20 mg total) by mouth daily as needed for fluid or edema. 12/30/17  Yes Timmothy Euler, MD  hydrocortisone (ANUSOL-HC) 25 MG suppository Place 1 suppository (25 mg total) rectally 2 (two) times daily. Patient taking differently: Place 25 mg rectally 2 (two) times daily as needed for hemorrhoids or anal itching.  04/26/18  Yes Terald Sleeper, PA-C  Insulin Detemir (LEVEMIR FLEXTOUCH) 100 UNIT/ML Pen INJECT 22-28 UNITS INTO THE SKIN DAILY. Patient taking differently: Inject 24 Units into the skin every morning.  07/12/18  Yes Stacks, Cletus Gash, MD  insulin lispro (HUMALOG KWIKPEN) 100 UNIT/ML KiwkPen Inject 0.04-0.1 mLs (4-10 Units  total) into the skin daily as needed (high blood sugar). 07/12/18  Yes [name], MD  isosorbide mononitrate (IMDUR) 30 MG 24 hr tablet Take 1 tablet (30 mg total) by mouth daily. 04/05/18  Yes Timmothy Euler, MD  lisinopril (PRINIVIL,ZESTRIL) 20 MG tablet TAKE 1 TABLET DAILY. Patient taking differently: Take 20 mg by mouth every morning.  09/08/18  Yes Stacks, Cletus Gash, MD  meclizine (ANTIVERT) 25 MG tablet TAKE 1 TABLET  TWICE DAILY AS NEEDED FOR DIZZINESS. Patient taking differently: Take 25 mg by mouth 2 (two) times daily.  10/04/18  Yes Stacks, Cletus Gash, MD  metFORMIN (GLUCOPHAGE-XR) 500 MG 24 hr tablet TAKE 1 TABLET WITH BREAKFAST AND 1 TABLET WITH SUPPER. Patient taking differently: Take 500 mg by mouth every evening.  08/01/18  Yes Stacks, Cletus Gash, MD  methenamine (HIPREX) 1 g tablet Take 1 tablet (1 g total) by mouth 2 (two) times daily. 08/09/18  Yes Stacks, Cletus Gash, MD  metoprolol tartrate (LOPRESSOR) 50 MG tablet TAKE (1) TABLET TWICE DAILY. Patient taking differently: Take 50 mg by mouth 2 (two) times daily.  05/23/18  Yes Timmothy Euler, MD  Multiple Vitamins-Iron (DAILY VITAMINS/IRON/BETA CAROT PO) Take 1 tablet by mouth at bedtime.   Yes [provider]  nitroGLYCERIN (NITROSTAT) 0.4 MG SL tablet PLACE ONE (1) TABLET UNDER TONGUE EVERY 5 MINUTES UP TO (3) DOSES AS NEEDED FOR CHEST PAIN. Patient taking differently: Place 0.4 mg under the tongue every 5 (five) minutes as needed for chest pain.  04/07/18  Yes Timmothy Euler, MD  PARoxetine (PAXIL) 20 MG tablet TAKE (1) TABLET BY MOUTH ONCE DAILY. Patient taking differently: Take 20 mg by mouth every morning.  04/05/18  Yes Timmothy Euler, MD  polyethylene glycol [facility]) packet Take 17 g by mouth daily. Patient taking differently: Take 17 g by mouth daily as needed for mild constipation or moderate constipation.  07/25/18  Yes Maudie Flakes, MD  blood glucose meter kit and supplies KIT Dispense based on insurance  coverage. Use 4 times a day as directed Dx: E11.9, Z79.4 06/13/18   Timmothy Euler, MD  budesonide-formoterol (SYMBICORT) 80-4.5 MCG/ACT inhaler Inhale 2 puffs into the lungs 2 (two) times daily. 10/01/18   Chevis Pretty, FNP  FORA LANCETS MISC Test blood sugars twice daily 07/12/18   [name], MD  glucose blood (ACCU-CHEK AVIVA) test strip Use to check blood glucose twice a day 07/12/18   [name], MD  Insulin Pen Needle (GLOBAL EASE INJECT PEN NEEDLES) 31G X 8 MM MISC AS DIRECTED 07/12/18   [name], MD  Insulin Syringe-Needle U-100 (EASY TOUCH INSULIN SYRINGE) 31G X 5/16" 1 ML MISC USE AS DIRECTED. 07/12/18   [name], MD    Family History Family History  Problem Relation Age of Onset  . Cancer Brother        porstate  . Early death Sister   . Heart disease Brother   . Heart disease Brother   . Heart attack Brother   . Heart disease Brother   . Heart attack Brother   . Diabetes Sister   . Heart attack Sister   . Diabetes Sister   . Osteoporosis Sister   . Hypertension Sister   . Heart attack Son   . Early death Son   . Pancreatitis Son   . Heart attack Daughter 5    Social History Social History   Tobacco Use  . Smoking status: Never Smoker  . Smokeless tobacco: Never Used  Substance Use Topics  . Alcohol use: No  . Drug use: No     Allergies   Advil [ibuprofen]; Heparin; and Propoxyphene n-acetaminophen   Review of Systems Review of Systems  Respiratory: Positive for cough and shortness of breath.   Cardiovascular: Positive for chest pain and leg swelling.  Gastrointestinal: Positive for abdominal distention.  All other systems reviewed and are negative.    Physical Exam Updated Vital Signs BP (!) 146/71  Pulse (!) 59   Temp 98 F (36.7 C) (Oral)   Resp 16   Ht 5' 6"  (1.676 m)   Wt 71.6 kg   SpO2 96%   BMI 25.48 kg/m   Physical Exam  Constitutional: She appears well-developed and well-nourished.  Non-toxic  appearance. She does not appear ill.  HENT:  Head: Normocephalic and atraumatic.  Right Ear: External ear normal.  Left Ear: External ear normal.  Nose: Nose normal.  Eyes: Right eye exhibits no discharge. Left eye exhibits no discharge.  Cardiovascular: Normal rate and regular rhythm.  Murmur heard. Pulmonary/Chest: Effort normal. No accessory muscle usage. Tachypnea noted. She has decreased breath sounds in the right lower field and the left lower field. She has rales in the right lower field and the left lower field.  Abdominal: Soft. There is no tenderness.  Musculoskeletal:       Right lower leg: She exhibits edema (pitting).       Left lower leg: She exhibits edema (pitting).  Neurological: She is alert.  Skin: Skin is warm and dry.  Psychiatric: Her mood appears not anxious.  Nursing note and vitals reviewed.    ED Treatments / Results  Labs (all labs ordered are listed, but only abnormal results are displayed) Labs Reviewed  BASIC METABOLIC PANEL - Abnormal; Notable for the following components:      Result Value   Glucose, Bld 225 (*)    BUN 30 (*)    Creatinine, Ser 1.23 (*)    GFR calc non Af Amer 39 (*)    GFR calc Af Amer 45 (*)    All other components within normal limits  CBC WITH DIFFERENTIAL/PLATELET - Abnormal; Notable for the following components:   RBC 3.47 (*)    Hemoglobin 9.8 (*)    HCT 31.2 (*)    All other components within normal limits  BRAIN NATRIURETIC PEPTIDE - Abnormal; Notable for the following components:   B Natriuretic Peptide 581.0 (*)    All other components within normal limits  TROPONIN I    EKG EKG Interpretation  Date/Time:  Friday October 07 2018 18:49:04 EDT Ventricular Rate:  66 PR Interval:    QRS Duration: 112 QT Interval:  450 QTC Calculation: 472 R Axis:   28 Text Interpretation:  Sinus rhythm LVH with secondary repolarization abnormality Baseline wander in lead(s) V2 similar to Feb 2018 Confirmed by Sherwood Gambler [phone]) on 10/07/2018 7:04:20 PM   Radiology Dg Chest 2 View  Result Date: 10/07/2018 CLINICAL DATA:  Shortness of breath, asthma, stroke, CHF, COPD, dementia, diabetes mellitus, hypertension, coronary artery disease post MI EXAM: CHEST - 2 VIEW COMPARISON:  11/23/2017 FINDINGS: Enlargement of cardiac silhouette post CABG. Atherosclerotic calcification aorta. Mediastinal contours and pulmonary vascularity normal. Bibasilar effusions and atelectasis new since previous exam. Underlying emphysematous changes. No segmental consolidation or pneumothorax. Bones demineralized. Coronary arterial calcifications noted. Progressive compression deformity of a vertebra at the thoracolumbar junction is identified, IMPRESSION: Enlargement of cardiac silhouette post CABG with extensive atherosclerotic calcifications in the aorta and coronary arteries. Emphysematous changes with new bibasilar pleural effusions and atelectasis. Electronically Signed   By: Lavonia Dana M.D.   On: 10/07/2018 17:30    Procedures Procedures (including critical care time)  Medications Ordered in ED Medications  furosemide (LASIX) injection 20 mg (has no administration in time range)     Initial Impression / Assessment and Plan / ED Course  I have reviewed the triage vital signs and the nursing notes.  Pertinent labs & imaging results that were available during my care of the patient were reviewed by me and considered in my medical decision making (see chart for details).     Patient is not in distress but does have hypoxia.  Likely from the bilateral pleural effusions.  Overall her presentation is consistent with CHF exacerbation.  I will start her on IV Lasix and she will need admission for further diuresis and respiratory support.  Final Clinical Impressions(s) / ED Diagnoses   Final diagnoses:  Acute on chronic diastolic congestive heart failure [facility])    ED Discharge Orders    None       Sherwood Gambler,  MD 10/07/18 2108    Sherwood Gambler, MD 10/07/18 2108

## 2018-10-07 NOTE — ED Triage Notes (Signed)
Patient sent from PCP for shortness of breath worsening over the last 2 weeks. States had chest x-ray today and advised to come to ER. Patient also complaining of chest and back pain.

## 2018-10-08 ENCOUNTER — Observation Stay (HOSPITAL_BASED_OUTPATIENT_CLINIC_OR_DEPARTMENT_OTHER): Payer: Medicare Other

## 2018-10-08 DIAGNOSIS — I5030 Unspecified diastolic (congestive) heart failure: Secondary | ICD-10-CM | POA: Diagnosis not present

## 2018-10-08 DIAGNOSIS — I130 Hypertensive heart and chronic kidney disease with heart failure and stage 1 through stage 4 chronic kidney disease, or unspecified chronic kidney disease: Secondary | ICD-10-CM | POA: Diagnosis not present

## 2018-10-08 LAB — BASIC METABOLIC PANEL
Anion gap: 10 (ref 5–15)
BUN: 29 mg/dL — AB (ref 8–23)
CO2: 24 mmol/L (ref 22–32)
Calcium: 8.9 mg/dL (ref 8.9–10.3)
Chloride: 106 mmol/L (ref 98–111)
Creatinine, Ser: 1.14 mg/dL — ABNORMAL HIGH (ref 0.44–1.00)
GFR calc Af Amer: 50 mL/min — ABNORMAL LOW (ref >60)
GFR calc non Af Amer: 43 mL/min — ABNORMAL LOW (ref >60)
Glucose, Bld: 99 mg/dL (ref 70–99)
POTASSIUM: 3.5 mmol/L (ref 3.5–5.1)
SODIUM: 140 mmol/L (ref 135–145)

## 2018-10-08 LAB — GLUCOSE, CAPILLARY
Glucose-Capillary: 110 mg/dL — ABNORMAL HIGH (ref 70–99)
Glucose-Capillary: 158 mg/dL — ABNORMAL HIGH (ref 70–99)
Glucose-Capillary: 88 mg/dL (ref 70–99)

## 2018-10-08 LAB — ECHOCARDIOGRAM COMPLETE
HEIGHTINCHES: 66 in
WEIGHTICAEL: 2528 [oz_av]

## 2018-10-08 LAB — HEMOGLOBIN A1C
HEMOGLOBIN A1C: 8.1 % — AB (ref 4.8–5.6)
Mean Plasma Glucose: 185.77 mg/dL

## 2018-10-08 LAB — TROPONIN I: Troponin I: <0.03 ng/mL (ref <0.03)

## 2018-10-08 MED ORDER — INSULIN ASPART 100 UNIT/ML ~~LOC~~ SOLN: 15.0000 [IU] | Freq: Once | SUBCUTANEOUS | Status: DC

## 2018-10-08 MED ORDER — FUROSEMIDE 10 MG/ML IJ SOLN
20.0000 mg | Freq: Two times a day (BID) | INTRAMUSCULAR | Status: DC
  Administered 2018-10-08 – 2018-10-10 (×4): 20 mg via INTRAVENOUS
  Filled 2018-10-08 (×3): qty 2

## 2018-10-08 MED ORDER — POTASSIUM CHLORIDE CRYS ER 20 MEQ PO TBCR
40.0000 meq | EXTENDED_RELEASE_TABLET | Freq: Once | ORAL | Status: AC
  Administered 2018-10-08: 40 meq via ORAL
  Filled 2018-10-08: qty 2

## 2018-10-08 NOTE — Progress Notes (Signed)
Patient Demographics:    [name], is a 84 y.o. female, DOB - June 19, 1931, DDU:[id number]  Admit date - 10/07/2018   Admitting Physician Shela Leff, MD  Outpatient Primary MD for the patient is [name], MD  LOS - 0   Chief Complaint  Patient presents with  . Shortness of Breath        Subjective:    [name] today has no fevers, no emesis,  No chest pain, dyspnea on exertion persist, hypoxia persist, shortness of breath at rest improving,  Assessment  & Plan :    Principal Problem:   Acute hypoxemic respiratory failure (HCC) Active Problems:   Type 2 diabetes mellitus with insulin therapy (HCC)   HLD (hyperlipidemia)   HTN (hypertension)   Depression   GERD (gastroesophageal reflux disease)   CKD (chronic kidney disease) stage 3, GFR 30-59 ml/min (HCC)   Acute exacerbation of CHF (congestive heart failure) (HCC)   Chronic anemia   Asthma   CVA (cerebral vascular accident) ([place])   CAD (coronary artery disease)   Constipation   Physical deconditioning   History of recurrent UTIs   Brief Summary:- 84 y.o. female with medical history significant of asthma, coronary artery disease status post CABG, chronic diastolic congestive heart failure, CKD 3, CVA, type 2 diabetes, hypertension, anemia    Plan:- 1)HFpEF-acute exacerbation of diastolic dysfunction CHF with EF in the 60 to 65% range, suspect partly due to noncompliance with Lasix, noncompliance with dietary restrictions and worsening valvular heart disease, diuresed well overnight, shortness of breath and hypoxia are slightly improving, will decrease Lasix to 20 mg IV twice daily, check daily weights and fluid input and output monitoring  2)Valvular Heart Disease-- Echo on 10/08/18  Compared to a prior study in 2016, the LVEF is stable at 60-65%.   There is now moderate aortic stenosis and mild to moderate mitral  stenosis with heavy MAC and severe LAE-patient will need to follow-up with cardiology as outpatient for further evaluation.  Given worsening aortic and mitral valve disease  3)DM2-fair control with A1c of 8.1, continue Levemir 24 units daily, Use Novolog/Humalog Sliding scale insulin with Accu-Cheks/Fingersticks as ordered   4)H/o CAD--stable, no ACS type symptoms at this time, continue aspirin, Lipitor and Plavix, continue metoprolol 50 mg twice daily  5)Social--- patient's daughter updated, questions answered  Disposition/Need for in-Hospital Stay- patient unable to be discharged at this time due to volume overload status requiring IV Lasix  Code Status : Full  Disposition Plan  : Home   DVT Prophylaxis  :  Lovenox   Lab Results  Component Value Date   PLT 253 10/07/2018    Inpatient Medications  Scheduled Meds: . aspirin  81 mg Oral QHS  . atorvastatin  20 mg Oral QHS  . clopidogrel  75 mg Oral q morning - 10a  . dextromethorphan-guaiFENesin  1 tablet Oral BID  . enoxaparin (LOVENOX) injection  40 mg Subcutaneous Q24H  . ferrous sulfate  325 mg Oral Q breakfast  . furosemide  40 mg Intravenous Q12H  . insulin aspart  0-9 Units Subcutaneous TID WC  . insulin detemir  24 Units Subcutaneous q morning - 10a  . methenamine  1 g Oral BID  . metoprolol tartrate  50 mg Oral BID  . mometasone-formoterol  2 puff Inhalation BID  . pantoprazole  40 mg Oral Daily  . PARoxetine  20 mg Oral q morning - 10a   Continuous Infusions: PRN Meds:.albuterol, hydrocortisone, nitroGLYCERIN, polyethylene glycol    Anti-infectives (From admission, onward)   None        Objective:   Vitals:   10/07/18 2239 10/08/18 0500 10/08/18 0614 10/08/18 1627  BP: (!) 126/99  (!) 156/90 (!) 186/71  Pulse:   60 63  Resp:   17 18  Temp: 98.2 F (36.8 C)  98.1 F (36.7 C) 98.9 F (37.2 C)  TempSrc: Oral  Oral Oral  SpO2: 96%  96% 95%  Weight: 71.7 kg 71.7 kg    Height: _0  (1.676 m)        Wt Readings from Last 3 Encounters:  10/08/18 71.7 kg  10/07/18 71.7 kg  10/01/18 71.2 kg     Intake/Output Summary (Last 24 hours) at 10/08/2018 1646 Last data filed at 10/08/2018 1515 Gross per 24 hour  Intake 360 ml  Output 2050 ml  Net -1690 ml     Physical Exam Patient is examined daily including today on 10/08/18 , exams remain the same as of yesterday except that has changed   Gen:- Awake Alert,  In no apparent distress  HEENT:- [place].AT, No sclera icterus Neck-Supple Neck,No JVD,.  Lungs-diminished in bases, faint bibasilar rales CV- S1, S2 normal, 3/6 systolic murmur, CABG scar Abd-  +ve B.Sounds, Abd Soft, No tenderness,    Extremity/Skin:- 1 plus edema,   good pulses Psych-affect is appropriate, oriented x3 Neuro-no new focal deficits, no tremors   Data Review:   Micro Results No results found for this or any previous visit (from the past 240 hour(s)).  Radiology Reports Dg Chest 2 View  Result Date: 10/07/2018 CLINICAL DATA:  Shortness of breath, asthma, stroke, CHF, COPD, dementia, diabetes mellitus, hypertension, coronary artery disease post MI EXAM: CHEST - 2 VIEW COMPARISON:  11/23/2017 FINDINGS: Enlargement of cardiac silhouette post CABG. Atherosclerotic calcification aorta. Mediastinal contours and pulmonary vascularity normal. Bibasilar effusions and atelectasis new since previous exam. Underlying emphysematous changes. No segmental consolidation or pneumothorax. Bones demineralized. Coronary arterial calcifications noted. Progressive compression deformity of a vertebra at the thoracolumbar junction is identified, IMPRESSION: Enlargement of cardiac silhouette post CABG with extensive atherosclerotic calcifications in the aorta and coronary arteries. Emphysematous changes with new bibasilar pleural effusions and atelectasis. Electronically Signed   By: Lavonia Dana M.D.   On: 10/07/2018 17:30     CBC Recent Labs  Lab 10/07/18 1934  WBC 6.8  HGB 9.8*   HCT 31.2*  PLT 253  MCV 89.9  MCH 28.2  MCHC 31.4  RDW 14.3  LYMPHSABS 1.1  MONOABS 0.6  EOSABS 0.1  BASOSABS 0.0    Chemistries  Recent Labs  Lab 10/07/18 1934 10/08/18 0446  NA 138 140  K 3.6 3.5  CL 106 106  CO2 24 24  GLUCOSE 225* 99  BUN 30* 29*  CREATININE 1.23* 1.14*  CALCIUM 9.0 8.9   ------------------------------------------------------------------------------------------------------------------ No results for input(s): CHOL, HDL, LDLCALC, TRIG, CHOLHDL, LDLDIRECT in the last 72 hours.  Lab Results  Component Value Date   HGBA1C 8.1 (H) 10/08/2018   ------------------------------------------------------------------------------------------------------------------ No results for input(s): TSH, T4TOTAL, T3FREE, THYROIDAB in the last 72 hours.  Invalid input(s): FREET3 ------------------------------------------------------------------------------------------------------------------ No results for input(s): VITAMINB12, FOLATE, FERRITIN, TIBC, IRON, RETICCTPCT in the last 72 hours.  Coagulation profile No results for  input(s): INR, PROTIME in the last 168 hours.  No results for input(s): DDIMER in the last 72 hours.  Cardiac Enzymes Recent Labs  Lab 10/07/18 2251 10/08/18 0446 10/08/18 1109  TROPONINI <0.03 <0.03 <0.03   ------------------------------------------------------------------------------------------------------------------    Component Value Date/Time   BNP 581.0 (H) 10/07/2018 1936     Roxan Hockey M.D on 10/08/2018 at 4:46 PM  Pager---[phone] Go to [email or web address] - password [email or web address] for contact info  [facility] - Office  [phone]

## 2018-10-08 NOTE — Progress Notes (Signed)
  Echocardiogram 2D Echocardiogram has been performed.  [name] 10/08/2018, 10:27 AM

## 2018-10-08 NOTE — Progress Notes (Signed)
Patient has an allergy to heparin and currently has an order for Lovenox every 24 hours.  On call MD notified to discontinue medication.  Will await a response.

## 2018-10-09 DIAGNOSIS — I5033 Acute on chronic diastolic (congestive) heart failure: Secondary | ICD-10-CM | POA: Diagnosis present

## 2018-10-09 DIAGNOSIS — I130 Hypertensive heart and chronic kidney disease with heart failure and stage 1 through stage 4 chronic kidney disease, or unspecified chronic kidney disease: Secondary | ICD-10-CM | POA: Diagnosis not present

## 2018-10-09 DIAGNOSIS — I5030 Unspecified diastolic (congestive) heart failure: Secondary | ICD-10-CM | POA: Diagnosis present

## 2018-10-09 DIAGNOSIS — I350 Nonrheumatic aortic (valve) stenosis: Secondary | ICD-10-CM

## 2018-10-09 DIAGNOSIS — I050 Rheumatic mitral stenosis: Secondary | ICD-10-CM | POA: Diagnosis present

## 2018-10-09 LAB — CBC
HCT: 32.5 % — ABNORMAL LOW (ref 36.0–46.0)
Hemoglobin: 10.2 g/dL — ABNORMAL LOW (ref 12.0–15.0)
MCH: 28.5 pg (ref 26.0–34.0)
MCHC: 31.4 g/dL (ref 30.0–36.0)
MCV: 90.8 fL (ref 80.0–100.0)
NRBC: 0 % (ref 0.0–0.2)
PLATELETS: 242 10*3/uL (ref 150–400)
RBC: 3.58 MIL/uL — ABNORMAL LOW (ref 3.87–5.11)
RDW: 14.4 % (ref 11.5–15.5)
WBC: 6.5 10*3/uL (ref 4.0–10.5)

## 2018-10-09 LAB — BASIC METABOLIC PANEL
ANION GAP: 6 (ref 5–15)
BUN: 27 mg/dL — ABNORMAL HIGH (ref 8–23)
CALCIUM: 8.9 mg/dL (ref 8.9–10.3)
CO2: 29 mmol/L (ref 22–32)
CREATININE: 1.06 mg/dL — AB (ref 0.44–1.00)
Chloride: 105 mmol/L (ref 98–111)
GFR, EST AFRICAN AMERICAN: 54 mL/min — AB (ref >60)
GFR, EST NON AFRICAN AMERICAN: 47 mL/min — AB (ref >60)
Glucose, Bld: 105 mg/dL — ABNORMAL HIGH (ref 70–99)
Potassium: 4 mmol/L (ref 3.5–5.1)
SODIUM: 140 mmol/L (ref 135–145)

## 2018-10-09 LAB — GLUCOSE, CAPILLARY
GLUCOSE-CAPILLARY: 118 mg/dL — AB (ref 70–99)
Glucose-Capillary: 97 mg/dL (ref 70–99)
Glucose-Capillary: 170 mg/dL — ABNORMAL HIGH (ref 70–99)
Glucose-Capillary: 139 mg/dL — ABNORMAL HIGH (ref 70–99)

## 2018-10-09 MED ORDER — AMLODIPINE BESYLATE 5 MG PO TABS
10.0000 mg | ORAL_TABLET | Freq: Every day | ORAL | Status: DC
  Administered 2018-10-09 – 2018-10-10 (×2): 10 mg via ORAL
  Filled 2018-10-09 (×2): qty 2

## 2018-10-09 MED ORDER — HYDRALAZINE HCL 20 MG/ML IJ SOLN: 10.0000 mg | Freq: Four times a day (QID) | INTRAMUSCULAR | Status: DC | PRN

## 2018-10-09 MED ORDER — LISINOPRIL 10 MG PO TABS
10.0000 mg | ORAL_TABLET | Freq: Every day | ORAL | Status: DC
  Administered 2018-10-09 – 2018-10-10 (×2): 10 mg via ORAL
  Filled 2018-10-09 (×2): qty 1

## 2018-10-09 NOTE — Progress Notes (Signed)
Patient Demographics:    [name], is a 84 y.o. female, DOB - 01/08/1931, VCB:[id number]  Admit date - 10/07/2018   Admitting Physician Shela Leff, MD  Outpatient Primary MD for the patient is [name], MD  LOS - 0   Chief Complaint  Patient presents with  . Shortness of Breath        Subjective:    [name] today has no fevers, no emesis,  No chest pain, dyspnea on exertion persist, hypoxia persist, shortness of breath at rest improving,  Assessment  & Plan :    Principal Problem:   Acute on chronic diastolic CHF (congestive heart failure) (HCC) Active Problems:   Acute hypoxemic respiratory failure (HCC)   (HFpEF) heart failure with preserved ejection fraction (HCC)   Aortic stenosis, moderate   Type 2 diabetes mellitus with insulin therapy ([place])   HLD (hyperlipidemia)   HTN (hypertension)   Depression   GERD (gastroesophageal reflux disease)   CKD (chronic kidney disease) stage 3, GFR 30-59 ml/min (HCC)   Acute exacerbation of CHF (congestive heart failure) (HCC)   Chronic anemia   Asthma   CVA (cerebral vascular accident) ([place])   CAD (coronary artery disease)   Constipation   Physical deconditioning   History of recurrent UTIs   Mitral valve stenosis   Brief Summary:- 84 y.o. female with medical history significant of asthma, coronary artery disease status post CABG, chronic diastolic congestive heart failure, CKD 3, CVA, type 2 diabetes, hypertension, anemia admitted on 10/07/2018 with acute exacerbation of diastolic dysfunction CHF and new onset hypoxia   Plan:- 1)HFpEF-acute exacerbation of diastolic dysfunction CHF with EF in the 60 to 65% range, suspect partly due to noncompliance with Lasix, noncompliance with dietary restrictions and worsening valvular heart disease, improving slowly with IV diuresis , fluid balance negative over 2100 , shortness of  breath and hypoxia are slightly improving, continue Lasix to 20 mg IV twice daily, consider switching to oral Lasix on 10/10/2018 if continues to improve , continue to check daily weights and fluid input and output monitoring  2)Valvular Heart Disease-- Echo on 10/08/18  Compared to a prior study in 2016, the LVEF is stable at 60-65%.   There is now moderate aortic stenosis and mild to moderate mitral stenosis with heavy MAC and severe LAE-patient will need to follow-up with cardiology as outpatient for further evaluation.  Given worsening aortic and mitral valve disease, may consider inpatient cardiology consult as well  3)DM2-fair control with A1c of 8.1, continue Levemir 24 units daily, Use Novolog/Humalog Sliding scale insulin with Accu-Cheks/Fingersticks as ordered   4)H/o CAD--stable, no ACS type symptoms at this time, continue aspirin, Lipitor and Plavix, continue metoprolol 50 mg twice daily  5)Social--- patient's daughter updated, questions answered,  6)HTN--- stage II, BP not at goal, restart amlodipine 10 mg daily, restart lisinopril 10 mg daily, continue metoprolol 50 twice daily,   may use IV Hydralazine 10 mg  Every 4 hours Prn for systolic blood pressure over 160 mmhg  7) acute hypoxic respiratory failure--- this is new onset, due to acute diastolic dysfunction CHF, improving with diuresis, try to qualify patient for home O2 if remains hypoxic despite improvement in cardiopulmonary status  Disposition/Need for in-Hospital Stay- patient unable to  be discharged at this time due to volume overload status requiring IV Lasix, await Phy therapy eval  Code Status : Full  Disposition Plan  : Home pending PT eval  DVT Prophylaxis  :  Lovenox   Lab Results  Component Value Date   PLT 242 10/09/2018    Inpatient Medications  Scheduled Meds: . amLODipine  10 mg Oral Daily  . aspirin  81 mg Oral QHS  . atorvastatin  20 mg Oral QHS  . clopidogrel  75 mg Oral q morning - 10a  .  dextromethorphan-guaiFENesin  1 tablet Oral BID  . ferrous sulfate  325 mg Oral Q breakfast  . furosemide  20 mg Intravenous Q12H  . insulin aspart  0-9 Units Subcutaneous TID WC  . insulin detemir  24 Units Subcutaneous q morning - 10a  . lisinopril  10 mg Oral Daily  . methenamine  1 g Oral BID  . metoprolol tartrate  50 mg Oral BID  . mometasone-formoterol  2 puff Inhalation BID  . pantoprazole  40 mg Oral Daily  . PARoxetine  20 mg Oral q morning - 10a   Continuous Infusions: PRN Meds:.albuterol, hydrALAZINE, hydrocortisone, nitroGLYCERIN, polyethylene glycol    Anti-infectives (From admission, onward)   None        Objective:   Vitals:   10/09/18 0408 10/09/18 0633 10/09/18 0753 10/09/18 1432  BP:  (!) 167/61  (!) 183/66  Pulse:  (!) 57  62  Resp:  18  16  Temp:  98.2 F (36.8 C)  98.5 F (36.9 C)  TempSrc:  Oral  Oral  SpO2:  95% 92% 95%  Weight: 71.2 kg     Height:        Wt Readings from Last 3 Encounters:  10/09/18 71.2 kg  10/07/18 71.7 kg  10/01/18 71.2 kg     Intake/Output Summary (Last 24 hours) at 10/09/2018 1643 Last data filed at 10/09/2018 1300 Gross per 24 hour  Intake 120 ml  Output 2250 ml  Net -2130 ml     Physical Exam Patient is examined daily including today on 10/09/18 , exams remain the same as of yesterday except that has changed   Gen:- Awake Alert,  In no apparent distress  HEENT:- [place].AT, No sclera icterus Neck-Supple Neck,No JVD,.  Lungs-diminished in bases, faint bibasilar rales CV- S1, S2 normal, 3/6 systolic murmur, CABG scar Abd-  +ve B.Sounds, Abd Soft, No tenderness,    Extremity/Skin:- 1 plus edema,   good pulses Psych-affect is appropriate, oriented x3 Neuro-no new focal deficits, no tremors   Data Review:   Micro Results No results found for this or any previous visit (from the past 240 hour(s)).  Radiology Reports Dg Chest 2 View  Result Date: 10/07/2018 CLINICAL DATA:  Shortness of breath, asthma, stroke,  CHF, COPD, dementia, diabetes mellitus, hypertension, coronary artery disease post MI EXAM: CHEST - 2 VIEW COMPARISON:  11/23/2017 FINDINGS: Enlargement of cardiac silhouette post CABG. Atherosclerotic calcification aorta. Mediastinal contours and pulmonary vascularity normal. Bibasilar effusions and atelectasis new since previous exam. Underlying emphysematous changes. No segmental consolidation or pneumothorax. Bones demineralized. Coronary arterial calcifications noted. Progressive compression deformity of a vertebra at the thoracolumbar junction is identified, IMPRESSION: Enlargement of cardiac silhouette post CABG with extensive atherosclerotic calcifications in the aorta and coronary arteries. Emphysematous changes with new bibasilar pleural effusions and atelectasis. Electronically Signed   By: Lavonia Dana M.D.   On: 10/07/2018 17:30     CBC Recent Labs  Lab  10/07/18 1934 10/09/18 0605  WBC 6.8 6.5  HGB 9.8* 10.2*  HCT 31.2* 32.5*  PLT 253 242  MCV 89.9 90.8  MCH 28.2 28.5  MCHC 31.4 31.4  RDW 14.3 14.4  LYMPHSABS 1.1  --   MONOABS 0.6  --   EOSABS 0.1  --   BASOSABS 0.0  --     Chemistries  Recent Labs  Lab 10/07/18 1934 10/08/18 0446 10/09/18 0605  NA 138 140 140  K 3.6 3.5 4.0  CL 106 106 105  CO2 _0 GLUCOSE 225* 99 105*  BUN 30* 29* 27*  CREATININE 1.23* 1.14* 1.06*  CALCIUM 9.0 8.9 8.9   ------------------------------------------------------------------------------------------------------------------ No results for input(s): CHOL, HDL, LDLCALC, TRIG, CHOLHDL, LDLDIRECT in the last 72 hours.  Lab Results  Component Value Date   HGBA1C 8.1 (H) 10/08/2018   ------------------------------------------------------------------------------------------------------------------ No results for input(s): TSH, T4TOTAL, T3FREE, THYROIDAB in the last 72 hours.  Invalid input(s):  FREET3 ------------------------------------------------------------------------------------------------------------------ No results for input(s): VITAMINB12, FOLATE, FERRITIN, TIBC, IRON, RETICCTPCT in the last 72 hours.  Coagulation profile No results for input(s): INR, PROTIME in the last 168 hours.  No results for input(s): DDIMER in the last 72 hours.  Cardiac Enzymes Recent Labs  Lab 10/07/18 2251 10/08/18 0446 10/08/18 1109  TROPONINI <0.03 <0.03 <0.03   ------------------------------------------------------------------------------------------------------------------    Component Value Date/Time   BNP 581.0 (H) 10/07/2018 1936   Roxan Hockey M.D on 10/09/2018 at 4:43 PM  Pager---[phone] Go to [email or web address] - password [email or web address] for contact info  [facility] - Office  [phone]

## 2018-10-10 DIAGNOSIS — N183 Chronic kidney disease, stage 3 (moderate): Secondary | ICD-10-CM | POA: Diagnosis not present

## 2018-10-10 DIAGNOSIS — I5033 Acute on chronic diastolic (congestive) heart failure: Secondary | ICD-10-CM | POA: Diagnosis not present

## 2018-10-10 DIAGNOSIS — E1122 Type 2 diabetes mellitus with diabetic chronic kidney disease: Secondary | ICD-10-CM | POA: Diagnosis not present

## 2018-10-10 DIAGNOSIS — E119 Type 2 diabetes mellitus without complications: Secondary | ICD-10-CM

## 2018-10-10 DIAGNOSIS — H5462 Unqualified visual loss, left eye, normal vision right eye: Secondary | ICD-10-CM | POA: Diagnosis not present

## 2018-10-10 DIAGNOSIS — K5909 Other constipation: Secondary | ICD-10-CM | POA: Diagnosis not present

## 2018-10-10 DIAGNOSIS — I252 Old myocardial infarction: Secondary | ICD-10-CM | POA: Diagnosis not present

## 2018-10-10 DIAGNOSIS — E785 Hyperlipidemia, unspecified: Secondary | ICD-10-CM | POA: Diagnosis not present

## 2018-10-10 DIAGNOSIS — I080 Rheumatic disorders of both mitral and aortic valves: Secondary | ICD-10-CM | POA: Diagnosis not present

## 2018-10-10 DIAGNOSIS — I2510 Atherosclerotic heart disease of native coronary artery without angina pectoris: Secondary | ICD-10-CM | POA: Diagnosis not present

## 2018-10-10 DIAGNOSIS — Z794 Long term (current) use of insulin: Secondary | ICD-10-CM

## 2018-10-10 DIAGNOSIS — Z955 Presence of coronary angioplasty implant and graft: Secondary | ICD-10-CM | POA: Diagnosis not present

## 2018-10-10 DIAGNOSIS — R5381 Other malaise: Secondary | ICD-10-CM

## 2018-10-10 DIAGNOSIS — M1990 Unspecified osteoarthritis, unspecified site: Secondary | ICD-10-CM | POA: Diagnosis not present

## 2018-10-10 DIAGNOSIS — J45909 Unspecified asthma, uncomplicated: Secondary | ICD-10-CM | POA: Diagnosis not present

## 2018-10-10 DIAGNOSIS — K219 Gastro-esophageal reflux disease without esophagitis: Secondary | ICD-10-CM | POA: Diagnosis not present

## 2018-10-10 DIAGNOSIS — Z951 Presence of aortocoronary bypass graft: Secondary | ICD-10-CM | POA: Diagnosis not present

## 2018-10-10 DIAGNOSIS — I130 Hypertensive heart and chronic kidney disease with heart failure and stage 1 through stage 4 chronic kidney disease, or unspecified chronic kidney disease: Secondary | ICD-10-CM | POA: Diagnosis not present

## 2018-10-10 DIAGNOSIS — Z8249 Family history of ischemic heart disease and other diseases of the circulatory system: Secondary | ICD-10-CM | POA: Diagnosis not present

## 2018-10-10 DIAGNOSIS — D631 Anemia in chronic kidney disease: Secondary | ICD-10-CM | POA: Diagnosis not present

## 2018-10-10 DIAGNOSIS — J9601 Acute respiratory failure with hypoxia: Secondary | ICD-10-CM | POA: Diagnosis not present

## 2018-10-10 DIAGNOSIS — I350 Nonrheumatic aortic (valve) stenosis: Secondary | ICD-10-CM

## 2018-10-10 DIAGNOSIS — I10 Essential (primary) hypertension: Secondary | ICD-10-CM

## 2018-10-10 DIAGNOSIS — Z96651 Presence of right artificial knee joint: Secondary | ICD-10-CM | POA: Diagnosis not present

## 2018-10-10 DIAGNOSIS — I050 Rheumatic mitral stenosis: Secondary | ICD-10-CM

## 2018-10-10 DIAGNOSIS — Z833 Family history of diabetes mellitus: Secondary | ICD-10-CM | POA: Diagnosis not present

## 2018-10-10 DIAGNOSIS — Z8673 Personal history of transient ischemic attack (TIA), and cerebral infarction without residual deficits: Secondary | ICD-10-CM | POA: Diagnosis not present

## 2018-10-10 LAB — CBC
HEMATOCRIT: 34.4 % — AB (ref 36.0–46.0)
Hemoglobin: 11.2 g/dL — ABNORMAL LOW (ref 12.0–15.0)
MCH: 29.2 pg (ref 26.0–34.0)
MCHC: 32.6 g/dL (ref 30.0–36.0)
MCV: 89.6 fL (ref 80.0–100.0)
NRBC: 0 % (ref 0.0–0.2)
Platelets: 260 10*3/uL (ref 150–400)
RBC: 3.84 MIL/uL — ABNORMAL LOW (ref 3.87–5.11)
RDW: 14.2 % (ref 11.5–15.5)
WBC: 7.8 10*3/uL (ref 4.0–10.5)

## 2018-10-10 LAB — GLUCOSE, CAPILLARY
GLUCOSE-CAPILLARY: 147 mg/dL — AB (ref 70–99)
Glucose-Capillary: 75 mg/dL (ref 70–99)
Glucose-Capillary: 185 mg/dL — ABNORMAL HIGH (ref 70–99)

## 2018-10-10 MED ORDER — OXYCODONE HCL 5 MG PO TABS: 5.0000 mg | ORAL_TABLET | Freq: Four times a day (QID) | ORAL | Status: DC | PRN

## 2018-10-10 MED ORDER — TRAMADOL HCL 50 MG PO TABS
50.0000 mg | ORAL_TABLET | Freq: Four times a day (QID) | ORAL | Status: DC | PRN
  Administered 2018-10-10: 50 mg via ORAL
  Filled 2018-10-10: qty 1

## 2018-10-10 MED ORDER — FUROSEMIDE 20 MG PO TABS: 20.0000 mg | ORAL_TABLET | Freq: Every day | ORAL | 11 refills | Status: DC

## 2018-10-10 NOTE — Care Management Obs Status (Signed)
[facility] NOTIFICATION   Patient Details  Name: [name] MRN: [id number] Date of Birth: May 12, 1931   Medicare Observation Status Notification Given:  Yes    Shelda Altes 10/10/2018, 10:49 AM

## 2018-10-10 NOTE — Progress Notes (Signed)
Patient discharged home with family.  IV removed- WNL.  Reviewed AVS and medications, instructed to follow up with cardio and PCP.  Verbalizes understanding.  No questions at this time and patient in NAD

## 2018-10-10 NOTE — Care Management Note (Addendum)
Case Management Note  Patient Details  Name: [name] MRN: [id number] Date of Birth: 11-11-1931  Subjective/Objective:     CHF. From home. Walks with [employer]. Has RW, WC. Daughter lives with her and is there 24/7. Has PCP. Acutely requiring oxygen.  PT eval pending.              Action/Plan: Discussed [facility] CHF program with patient and daughter, both agreeable. Reports they are moving soon so may want to delay start until after move. Will need home O2 eval prior to DC.   Expected Discharge Date:    10/11/2018              Expected Discharge Plan:  [place]  In-House Referral:     Discharge planning Services  CM Consult  Post Acute Care Choice:  Home Health Choice offered to:  Patient, Adult Children  DME Arranged:    DME Agency:     HH Arranged:  PT, RN, Disease Management [place] Agency:  [facility] (now [facility])  Status of Service:  Completed, signed off  If discussed at [place] of Stay Meetings, dates discussed:    Additional Comments:  Tyechia Allmendinger, Chauncey Reading, RN 10/10/2018, 2:04 PM

## 2018-10-10 NOTE — Discharge Summary (Signed)
Physician Discharge Summary  [name] MRN: [id number] DOB/AGE: 1931/03/27 84 y.o.  Admit date: 10/07/2018 Discharge date: 10/10/2018  Admission Diagnoses: Principal Problem:   Acute on chronic diastolic CHF (congestive heart failure) (HCC) Active Problems:   Type 2 diabetes mellitus with insulin therapy (HCC)   HLD (hyperlipidemia)   HTN (hypertension)   Depression   GERD (gastroesophageal reflux disease)   CKD (chronic kidney disease) stage 3, GFR 30-59 ml/min (HCC)   Acute exacerbation of CHF (congestive heart failure) (HCC)   Acute hypoxemic respiratory failure (HCC)   Chronic anemia   Asthma   CVA (cerebral vascular accident) ([place])   CAD (coronary artery disease)   Constipation   Physical deconditioning   History of recurrent UTIs   Aortic stenosis, moderate   Mitral valve stenosis   Discharge Diagnoses:  Principal Problem:   Acute on chronic diastolic CHF (congestive heart failure) (HCC) Active Problems:   Type 2 diabetes mellitus with insulin therapy ([place])   HLD (hyperlipidemia)   HTN (hypertension)   Depression   GERD (gastroesophageal reflux disease)   CKD (chronic kidney disease) stage 3, GFR 30-59 ml/min (HCC)   Acute exacerbation of CHF (congestive heart failure) (HCC)   Acute hypoxemic respiratory failure (HCC)   Chronic anemia   Asthma   CVA (cerebral vascular accident) ([place])   CAD (coronary artery disease)   Constipation   Physical deconditioning   History of recurrent UTIs   Aortic stenosis, moderate   Mitral valve stenosis   Discharged Condition: good     Presentation: 84 y.o.femalewith medical history significant ofasthma, coronary artery disease status post CABG, chronic diastolic congestive heart failure, CKD 3, CVA, type 2 diabetes, hypertension, anemia admitted on 10/07/2018 with acute exacerbation of diastolic dysfunction CHF and new onset hypoxia  Hospital Course:  1) HFpEF-acute exacerbation of diastolic  dysfunction CHF with EF in the 60 to 65% range, suspect partly due to noncompliance with Lasix, noncompliance with dietary restrictions and worsening valvular heart disease - echo 10/08/18 compared to a prior study in 2016, the LVEF is stable at 60-65%. here is now moderate aortic stenosis and mild to moderate mitral stenosis with heavy MAC and severe LAE - diuresed ~ 6 L in 60 hrs on IV lasix 20 bid and SOB and leg edema resolved, pt is now on room air and symptoms resolved   - I spoke w/ cardiology on call they reviewed the echo and didn't see any urgent findings , pt can f/u her cardiologist in OP setting as scheduled previously - will start daily po lasix 20 mg at home (was taking prn) - pt to f/u w PCP until she can get back to see her heart doctor - watch daily wts for increase  2)DM2- fair control, resume home meds   3)H/o CAD--stable, no ACS type symptoms at this time, continue aspirin, Lipitor and Plavix, continue metoprolol 50 mg twice daily  4)HTN - cont home meds including amlodipine 10 mg, lisinopril and metoprolol  5) Acute hypoxic respiratory failure--- due to decomp diast CHF, edema and dyspnea/ hypoxemia resolved after diuresis  Consults: None  Treatments: IV lasix  Discharge Exam: Blood pressure (!) 131/55, pulse 62, temperature 98.9 F (37.2 C), temperature source Oral, resp. rate 18, height 5' 6"  (1.676 m), weight 69.1 kg, SpO2 94 %. Gen:- Awake Alert,  In no apparent distress  HEENT:- [place].AT, No sclera icterus Neck-Supple Neck,No JVD,.  Lungs-diminished in bases, faint bibasilar rales CV- S1, S2 normal, 3/6 systolic murmur,  CABG scar Abd-  +ve B.Sounds, Abd Soft, No tenderness,    Extremity/Skin: no edema Psych-affect is appropriate, oriented x3 Neuro-no new focal deficits, no tremors  Disposition:    Allergies as of 10/10/2018      Reactions   Advil [ibuprofen] Swelling   Heparin Other (See Comments)   Confusion, hallucinations.    Propoxyphene  N-acetaminophen Other (See Comments)   Numbness all over. "floating" sensation.      Medication List    TAKE these medications   albuterol 108 (90 Base) MCG/ACT inhaler Commonly known as:  PROVENTIL HFA;VENTOLIN HFA Inhale 2 puffs into the lungs every 6 (six) hours as needed for wheezing or shortness of breath.   amLODipine 10 MG tablet Commonly known as:  NORVASC TAKE (1) TABLET BY MOUTH ONCE DAILY. What changed:    how much to take  how to take this  when to take this  additional instructions   aspirin 81 MG chewable tablet Chew 81 mg by mouth at bedtime.   atorvastatin 20 MG tablet Commonly known as:  LIPITOR Take 1 tablet (20 mg total) by mouth at bedtime.   blood glucose meter kit and supplies Kit Dispense based on insurance coverage. Use 4 times a day as directed Dx: E11.9, Z79.4   budesonide-formoterol 80-4.5 MCG/ACT inhaler Commonly known as:  SYMBICORT Inhale 2 puffs into the lungs 2 (two) times daily.   clopidogrel 75 MG tablet Commonly known as:  PLAVIX TAKE (1) TABLET BY MOUTH ONCE DAILY. What changed:  See the new instructions.   DAILY VITAMINS/IRON/BETA CAROT PO Take 1 tablet by mouth at bedtime.   DRY EYES OP Apply 1 drop to eye daily as needed (dry/red eyes).   esomeprazole 40 MG capsule Commonly known as:  NEXIUM Take 1 capsule (40 mg total) by mouth daily.   ferrous sulfate 325 (65 FE) MG tablet TAKE 1 TABLET 3 TIMES A DAY AFTER MEALS. What changed:  See the new instructions.   FORA LANCETS Misc Test blood sugars twice daily   furosemide 20 MG tablet Commonly known as:  LASIX Take 1 tablet (20 mg total) by mouth daily. What changed:    when to take this  reasons to take this   glucose blood test strip Use to check blood glucose twice a day   hydrocortisone 25 MG suppository Commonly known as:  ANUSOL-HC Place 1 suppository (25 mg total) rectally 2 (two) times daily. What changed:    when to take this  reasons to take  this   Insulin Detemir 100 UNIT/ML Pen Commonly known as:  LEVEMIR INJECT 22-28 UNITS INTO THE SKIN DAILY. What changed:    how much to take  how to take this  when to take this  additional instructions   insulin lispro 100 UNIT/ML KiwkPen Commonly known as:  HUMALOG Inject 0.04-0.1 mLs (4-10 Units total) into the skin daily as needed (high blood sugar).   Insulin Pen Needle 31G X 8 MM Misc AS DIRECTED   Insulin Syringe-Needle U-100 31G X 5/16" 1 ML Misc USE AS DIRECTED.   isosorbide mononitrate 30 MG 24 hr tablet Commonly known as:  IMDUR Take 1 tablet (30 mg total) by mouth daily.   lisinopril 20 MG tablet Commonly known as:  PRINIVIL,ZESTRIL TAKE 1 TABLET DAILY. What changed:  when to take this   meclizine 25 MG tablet Commonly known as:  ANTIVERT TAKE 1 TABLET TWICE DAILY AS NEEDED FOR DIZZINESS. What changed:  See the new instructions.  metFORMIN 500 MG 24 hr tablet Commonly known as:  GLUCOPHAGE-XR TAKE 1 TABLET WITH BREAKFAST AND 1 TABLET WITH SUPPER. What changed:  See the new instructions.   methenamine 1 g tablet Commonly known as:  HIPREX Take 1 tablet (1 g total) by mouth 2 (two) times daily.   metoprolol tartrate 50 MG tablet Commonly known as:  LOPRESSOR TAKE (1) TABLET TWICE DAILY. What changed:  See the new instructions.   nitroGLYCERIN 0.4 MG SL tablet Commonly known as:  NITROSTAT PLACE ONE (1) TABLET UNDER TONGUE EVERY 5 MINUTES UP TO (3) DOSES AS NEEDED FOR CHEST PAIN. What changed:  See the new instructions.   PARoxetine 20 MG tablet Commonly known as:  PAXIL TAKE (1) TABLET BY MOUTH ONCE DAILY. What changed:    how much to take  how to take this  when to take this  additional instructions   polyethylene glycol packet Commonly known as:  MIRALAX / GLYCOLAX Take 17 g by mouth daily. What changed:    when to take this  reasons to take this        Signed: Sol Blazing 10/10/2018, 4:27 PM

## 2018-10-12 ENCOUNTER — Telehealth: Payer: Self-pay | Admitting: *Deleted

## 2018-10-12 NOTE — Telephone Encounter (Addendum)
Call Completed and Appointment Scheduled: Yes, Date: 10/18/2018 with Dr Stacks. Spoke with [name], patient's daughter.   DISCHARGE INFORMATION Date of Discharge:10/10/2018  Discharge Facility: [place] [facility]  Principal Discharge Diagnosis: Acute on chronic diastolic congestive heart failure  Patient and/or caregiver is knowledgeable of his/her condition(s) and treatment: Yes  MEDICATION RECONCILIATION Current medication list reviewed with patient:Yes  Outpatient Encounter Medications as of 10/12/2018  Medication Sig  . albuterol (PROVENTIL HFA;VENTOLIN HFA) 108 (90 Base) MCG/ACT inhaler Inhale 2 puffs into the lungs every 6 (six) hours as needed for wheezing or shortness of breath.  . amLODipine (NORVASC) 10 MG tablet TAKE (1) TABLET BY MOUTH ONCE DAILY. (Patient taking differently: Take 10 mg by mouth every morning. )  . Artificial Tear Ointment (DRY EYES OP) Apply 1 drop to eye daily as needed (dry/red eyes).   . aspirin 81 MG chewable tablet Chew 81 mg by mouth at bedtime.  . atorvastatin (LIPITOR) 20 MG tablet Take 1 tablet (20 mg total) by mouth at bedtime.  . blood glucose meter kit and supplies KIT Dispense based on insurance coverage. Use 4 times a day as directed Dx: E11.9, Z79.4  . budesonide-formoterol (SYMBICORT) 80-4.5 MCG/ACT inhaler Inhale 2 puffs into the lungs 2 (two) times daily.  . clopidogrel (PLAVIX) 75 MG tablet TAKE (1) TABLET BY MOUTH ONCE DAILY. (Patient taking differently: Take 75 mg by mouth every morning. )  . esomeprazole (NEXIUM) 40 MG capsule Take 1 capsule (40 mg total) by mouth daily.  . ferrous sulfate 325 (65 FE) MG tablet TAKE 1 TABLET 3 TIMES A DAY AFTER MEALS. (Patient taking differently: Take 325 mg by mouth 3 (three) times daily with meals. )  . FORA LANCETS MISC Test blood sugars twice daily  . furosemide (LASIX) 20 MG tablet Take 1 tablet (20 mg total) by mouth daily.  . glucose blood (ACCU-CHEK AVIVA) test strip Use to check blood glucose twice a day   . hydrocortisone (ANUSOL-HC) 25 MG suppository Place 1 suppository (25 mg total) rectally 2 (two) times daily. (Patient taking differently: Place 25 mg rectally 2 (two) times daily as needed for hemorrhoids or anal itching. )  . Insulin Detemir (LEVEMIR FLEXTOUCH) 100 UNIT/ML Pen INJECT 22-28 UNITS INTO THE SKIN DAILY. (Patient taking differently: Inject 24 Units into the skin every morning. )  . insulin lispro (HUMALOG KWIKPEN) 100 UNIT/ML KiwkPen Inject 0.04-0.1 mLs (4-10 Units total) into the skin daily as needed (high blood sugar).  . Insulin Pen Needle (GLOBAL EASE INJECT PEN NEEDLES) 31G X 8 MM MISC AS DIRECTED  . Insulin Syringe-Needle U-100 (EASY TOUCH INSULIN SYRINGE) 31G X 5/16" 1 ML MISC USE AS DIRECTED.  . isosorbide mononitrate (IMDUR) 30 MG 24 hr tablet Take 1 tablet (30 mg total) by mouth daily.  . lisinopril (PRINIVIL,ZESTRIL) 20 MG tablet TAKE 1 TABLET DAILY. (Patient taking differently: Take 20 mg by mouth every morning. )  . meclizine (ANTIVERT) 25 MG tablet TAKE 1 TABLET TWICE DAILY AS NEEDED FOR DIZZINESS. (Patient taking differently: Take 25 mg by mouth 2 (two) times daily. )  . metFORMIN (GLUCOPHAGE-XR) 500 MG 24 hr tablet TAKE 1 TABLET WITH BREAKFAST AND 1 TABLET WITH SUPPER. (Patient taking differently: Take 500 mg by mouth every evening. )  . methenamine (HIPREX) 1 g tablet Take 1 tablet (1 g total) by mouth 2 (two) times daily.  . metoprolol tartrate (LOPRESSOR) 50 MG tablet TAKE (1) TABLET TWICE DAILY. (Patient taking differently: Take 50 mg by mouth 2 (two) times daily. )  .   Multiple Vitamins-Iron (DAILY VITAMINS/IRON/BETA CAROT PO) Take 1 tablet by mouth at bedtime.  . nitroGLYCERIN (NITROSTAT) 0.4 MG SL tablet PLACE ONE (1) TABLET UNDER TONGUE EVERY 5 MINUTES UP TO (3) DOSES AS NEEDED FOR CHEST PAIN. (Patient taking differently: Place 0.4 mg under the tongue every 5 (five) minutes as needed for chest pain. )  . PARoxetine (PAXIL) 20 MG tablet TAKE (1) TABLET BY MOUTH  ONCE DAILY. (Patient taking differently: Take 20 mg by mouth every morning. )  . polyethylene glycol (MIRALAX) packet Take 17 g by mouth daily. (Patient taking differently: Take 17 g by mouth daily as needed for mild constipation or moderate constipation. )   No facility-administered encounter medications on file as of 10/12/2018.     Discharge Medications reviewed and reconciled with current medications.yes  Patient is able to obtain needed medications:Yes  ACTIVITIES OF DAILY LIVING  Is the patient able to perform his/her own ADLs: Yes.    Patient is receiving home health services: Yes.  Daughter states that there is no change from baseline as far as Ol[place]a's ability to care for herself. She also said that a home health evaluation was ordered but that the patient and family requested that they wait a couple of weeks before coming out because they are currently getting settled into a new home.   PATIENT EDUCATION Questions/Concerns Discussed: No concerns at this time. Will f/u sooner if necessary.

## 2018-10-18 ENCOUNTER — Ambulatory Visit (INDEPENDENT_AMBULATORY_CARE_PROVIDER_SITE_OTHER): Payer: Medicare Other

## 2018-10-18 ENCOUNTER — Encounter: Payer: Self-pay | Admitting: Family Medicine

## 2018-10-18 ENCOUNTER — Ambulatory Visit (INDEPENDENT_AMBULATORY_CARE_PROVIDER_SITE_OTHER): Payer: Medicare Other | Admitting: Family Medicine

## 2018-10-18 VITALS — BP 132/80 | HR 63 | Temp 98.6°F | Ht 66.0 in | Wt 152.0 lb

## 2018-10-18 DIAGNOSIS — K219 Gastro-esophageal reflux disease without esophagitis: Secondary | ICD-10-CM

## 2018-10-18 DIAGNOSIS — N183 Chronic kidney disease, stage 3 unspecified: Secondary | ICD-10-CM

## 2018-10-18 DIAGNOSIS — J90 Pleural effusion, not elsewhere classified: Secondary | ICD-10-CM | POA: Diagnosis not present

## 2018-10-18 DIAGNOSIS — E782 Mixed hyperlipidemia: Secondary | ICD-10-CM | POA: Diagnosis not present

## 2018-10-18 DIAGNOSIS — I509 Heart failure, unspecified: Secondary | ICD-10-CM

## 2018-10-18 DIAGNOSIS — I10 Essential (primary) hypertension: Secondary | ICD-10-CM | POA: Diagnosis not present

## 2018-10-18 DIAGNOSIS — F411 Generalized anxiety disorder: Secondary | ICD-10-CM

## 2018-10-18 DIAGNOSIS — E785 Hyperlipidemia, unspecified: Secondary | ICD-10-CM

## 2018-10-18 DIAGNOSIS — E119 Type 2 diabetes mellitus without complications: Secondary | ICD-10-CM | POA: Diagnosis not present

## 2018-10-18 DIAGNOSIS — Z794 Long term (current) use of insulin: Secondary | ICD-10-CM | POA: Diagnosis not present

## 2018-10-18 LAB — BAYER DCA HB A1C WAIVED: HB A1C (BAYER DCA - WAIVED): 8.1 % — ABNORMAL HIGH (ref <7.0)

## 2018-10-18 MED ORDER — INSULIN DETEMIR 100 UNIT/ML FLEXPEN: 24.0000 [IU] | PEN_INJECTOR | Freq: Every morning | SUBCUTANEOUS | 5 refills | Status: DC

## 2018-10-18 MED ORDER — ATORVASTATIN CALCIUM 20 MG PO TABS: 20.0000 mg | ORAL_TABLET | Freq: Every day | ORAL | 3 refills | Status: DC

## 2018-10-18 MED ORDER — PANTOPRAZOLE SODIUM 40 MG PO TBEC: 40.0000 mg | DELAYED_RELEASE_TABLET | Freq: Every day | ORAL | 11 refills | Status: DC

## 2018-10-18 MED ORDER — METOPROLOL TARTRATE 50 MG PO TABS: 50.0000 mg | ORAL_TABLET | Freq: Two times a day (BID) | ORAL | 2 refills | Status: DC

## 2018-10-18 MED ORDER — AMLODIPINE BESYLATE 10 MG PO TABS: 10.0000 mg | ORAL_TABLET | Freq: Every morning | ORAL | 2 refills | Status: DC

## 2018-10-18 MED ORDER — ISOSORBIDE MONONITRATE ER 30 MG PO TB24: 30.0000 mg | ORAL_TABLET | Freq: Every day | ORAL | 3 refills | Status: DC

## 2018-10-18 MED ORDER — PAROXETINE HCL 20 MG PO TABS: 20.0000 mg | ORAL_TABLET | Freq: Every morning | ORAL | 1 refills | Status: DC

## 2018-10-18 MED ORDER — METFORMIN HCL ER 500 MG PO TB24: 500.0000 mg | ORAL_TABLET | Freq: Every evening | ORAL | 2 refills | Status: DC

## 2018-10-18 MED ORDER — MECLIZINE HCL 25 MG PO TABS: 25.0000 mg | ORAL_TABLET | Freq: Two times a day (BID) | ORAL | 2 refills | Status: DC

## 2018-10-18 MED ORDER — LISINOPRIL 20 MG PO TABS: 20.0000 mg | ORAL_TABLET | Freq: Every morning | ORAL | 2 refills | Status: DC

## 2018-10-18 MED ORDER — FUROSEMIDE 20 MG PO TABS: 20.0000 mg | ORAL_TABLET | Freq: Every day | ORAL | 11 refills | Status: DC

## 2018-10-18 MED ORDER — CLOPIDOGREL BISULFATE 75 MG PO TABS: 75.0000 mg | ORAL_TABLET | Freq: Every morning | ORAL | 2 refills | Status: DC

## 2018-10-18 NOTE — Progress Notes (Signed)
Subjective:  [name],  female    DOB: [birth year]  Age: 84 y.o.    CC: Transitional Care Management   HPI [name] presents for transitional care due to hospitalization ending on November 3 with discharge home.  She currently has no shortness of breath or edema.  She remains weak.  They are concerned about what to look for regarding recurrence of the congestive heart failure that led to her admission.  They would like to have home health, however they were concerned about having home health come in due to them moving from one home to another.  History is given by her 2 daughters at this point.  All 3 of them are living together in a new home but they are still unpacking and none of them really has the strength to do this.  Because of this they are taking it very slowly.  Follow-up of hypertension. Patient has no history of headache chest pain or shortness of breath or recent cough. Patient also denies symptoms of TIA such as numbness weakness lateralizing. Patient denies side effects from medication. States taking it regularly.  Patient also  in for follow-up of elevated cholesterol. Doing well without complaints on current medication. Denies side effects  including myalgia and arthralgia and nausea. Also in today for liver function testing. Currently no chest pain, shortness of breath or other cardiovascular related symptoms noted.  Follow-up of diabetes. Patient does check blood sugar at home.  This is done occasionally by her daughter and no readings were returned today.  Patient denies symptoms such as excessive hunger or urinary frequency, excessive hunger, nausea No significant hypoglycemic spells noted. Medications reviewed. Pt reports taking them regularly. Pt. denies complication/adverse reaction today.    History [name] has a past medical history of Anemia, Anxiety, Arthritis, Asthma, Balance problems, CAD (coronary artery disease), Carotid stenosis, CHF  (congestive heart failure) ([name]), CVA (cerebral infarction), Dementia ([name]), Depression, Diabetes mellitus, GERD (gastroesophageal reflux disease), Hearing loss, Hypertension, Iron deficiency anemia (05/27/2011), Myocardial infarction [name]), Renal insufficiency, Scoliosis, Shortness of breath dyspnea, Vertigo, and Vision loss.   She has a past surgical history that includes I & D of [place] (April 2013); Coronary artery bypass graft (2006); Coronary angioplasty; Total abdominal hysterectomy (~85 years old); Cholecystectomy; Appendectomy; Cataract extraction (Bilateral, 5 years ago); Total knee arthroplasty (Right, 08/13/2015); and Knee surgery (Right).   Her family history includes Cancer in her brother; Diabetes in her sister and sister; Early death in her sister and son; Heart attack in her brother, brother, sister, and son; Heart attack (age of onset: 44) in her daughter; Heart disease in her brother, brother, and brother; Hypertension in her sister; Osteoporosis in her sister; Pancreatitis in her son.She reports that she has never smoked. She has never used smokeless tobacco. She reports that she does not drink alcohol or use drugs.  Current Outpatient Medications on File Prior to Visit  Medication Sig Dispense Refill  . albuterol (PROVENTIL HFA;VENTOLIN HFA) 108 (90 Base) MCG/ACT inhaler Inhale 2 puffs into the lungs every 6 (six) hours as needed for wheezing or shortness of breath. 1 Inhaler 0  . Artificial Tear Ointment (DRY EYES OP) Apply 1 drop to eye daily as needed (dry/red eyes).     Marland Kitchen aspirin 81 MG chewable tablet Chew 81 mg by mouth at bedtime.    . blood glucose meter kit and supplies KIT Dispense based on insurance coverage. Use 4 times a day as directed Dx: E11.9, Z79.4  1 each 0  . budesonide-formoterol (SYMBICORT) 80-4.5 MCG/ACT inhaler Inhale 2 puffs into the lungs 2 (two) times daily. 1 Inhaler 3  . esomeprazole (NEXIUM) 40 MG capsule Take 1 capsule (40 mg total) by mouth daily. 30  capsule 5  . ferrous sulfate 325 (65 FE) MG tablet TAKE 1 TABLET 3 TIMES A DAY AFTER MEALS. (Patient taking differently: Take 325 mg by mouth 3 (three) times daily with meals. ) 90 tablet 4  . FORA LANCETS MISC Test blood sugars twice daily 200 each 3  . glucose blood (ACCU-CHEK AVIVA) test strip Use to check blood glucose twice a day 200 each 3  . hydrocortisone (ANUSOL-HC) 25 MG suppository Place 1 suppository (25 mg total) rectally 2 (two) times daily. (Patient taking differently: Place 25 mg rectally 2 (two) times daily as needed for hemorrhoids or anal itching. ) 12 suppository 0  . insulin lispro (HUMALOG KWIKPEN) 100 UNIT/ML KiwkPen Inject 0.04-0.1 mLs (4-10 Units total) into the skin daily as needed (high blood sugar). 15 mL 5  . Insulin Pen Needle (GLOBAL EASE INJECT PEN NEEDLES) 31G X 8 MM MISC AS DIRECTED 100 each 2  . Insulin Syringe-Needle U-100 (EASY TOUCH INSULIN SYRINGE) 31G X 5/16" 1 ML MISC USE AS DIRECTED. 80 each 2  . methenamine (HIPREX) 1 g tablet Take 1 tablet (1 g total) by mouth 2 (two) times daily. 60 tablet 5  . Multiple Vitamins-Iron (DAILY VITAMINS/IRON/BETA CAROT PO) Take 1 tablet by mouth at bedtime.    . nitroGLYCERIN (NITROSTAT) 0.4 MG SL tablet PLACE ONE (1) TABLET UNDER TONGUE EVERY 5 MINUTES UP TO (3) DOSES AS NEEDED FOR CHEST PAIN. (Patient taking differently: Place 0.4 mg under the tongue every 5 (five) minutes as needed for chest pain. ) 25 tablet 2  . polyethylene glycol (MIRALAX) packet Take 17 g by mouth daily. (Patient taking differently: Take 17 g by mouth daily as needed for mild constipation or moderate constipation. ) 30 each 0   No current facility-administered medications on file prior to visit.     ROS Review of Systems  Constitutional: Positive for fatigue.  HENT: Negative for congestion.   Eyes: Negative for visual disturbance.  Respiratory: Negative for shortness of breath.   Cardiovascular: Negative for chest pain and leg swelling.    Gastrointestinal: Negative for abdominal pain, constipation, diarrhea, nausea and vomiting.  Genitourinary: Negative for difficulty urinating.  Musculoskeletal: Negative for arthralgias and myalgias.  Neurological: Negative for headaches.  Psychiatric/Behavioral: Negative for sleep disturbance.    Objective:  BP 132/80   Pulse 63   Temp 98.6 F (37 C) (Oral)   Ht 5' 6"  (1.676 m)   Wt 152 lb (68.9 kg)   BMI 24.53 kg/m   BP Readings from Last 3 Encounters:  10/18/18 132/80  10/10/18 (!) 131/55  10/07/18 140/67    Wt Readings from Last 3 Encounters:  10/18/18 152 lb (68.9 kg)  10/10/18 152 lb 5.4 oz (69.1 kg)  10/07/18 158 lb (71.7 kg)     Physical Exam  Constitutional: She is oriented to person, place, and time. She appears well-developed and well-nourished. No distress.  Frail elderly female in no acute distress.  HENT:  Head: Normocephalic and atraumatic.  Right Ear: External ear normal.  Left Ear: External ear normal.  Nose: Nose normal.  Mouth/Throat: Oropharynx is clear and moist.  Eyes: Pupils are equal, round, and reactive to light. Conjunctivae and EOM are normal.  Neck: Normal range of motion. Neck supple. No thyromegaly  present.  Cardiovascular: Normal rate, regular rhythm and normal heart sounds.  No murmur heard. Pulmonary/Chest: Effort normal and breath sounds normal. No respiratory distress. She has no wheezes. She has no rales.  Abdominal: Soft. Bowel sounds are normal. She exhibits no distension. There is no tenderness.  Musculoskeletal: She exhibits edema (Trace at the ankles only) and deformity (Moderate dorsal kyphosis). She exhibits no tenderness.  Gait requires a walker  Lymphadenopathy:    She has no cervical adenopathy.  Neurological: She is alert and oriented to person, place, and time. She has normal reflexes.  Skin: Skin is warm and dry. She is not diaphoretic.  Psychiatric: She has a normal mood and affect. Her behavior is normal. Judgment  and thought content normal.    Diabetic Foot Exam - Simple   No data filed        Assessment & Plan:   [name] was seen today for transitional care management.  Diagnoses and all orders for this visit:  Type 2 diabetes mellitus with insulin therapy ([facility]) -     CBC with Differential/Platelet -     CMP14+EGFR -     Lipid panel -     Bayer DCA Hb A1c Waived -     metFORMIN (GLUCOPHAGE-XR) 500 MG 24 hr tablet; Take 1 tablet (500 mg total) by mouth every evening. -     clopidogrel (PLAVIX) 75 MG tablet; Take 1 tablet (75 mg total) by mouth every morning.  CKD (chronic kidney disease) stage 3, GFR 30-59 ml/min (HCC) -     CBC with Differential/Platelet -     CMP14+EGFR -     Lipid panel -     Bayer DCA Hb A1c Waived  Essential hypertension -     CBC with Differential/Platelet -     CMP14+EGFR -     Lipid panel -     Bayer DCA Hb A1c Waived -     metoprolol tartrate (LOPRESSOR) 50 MG tablet; Take 1 tablet (50 mg total) by mouth 2 (two) times daily.  Mixed hyperlipidemia -     CBC with Differential/Platelet -     CMP14+EGFR -     Lipid panel -     Bayer DCA Hb A1c Waived  GAD (generalized anxiety disorder) -     PARoxetine (PAXIL) 20 MG tablet; Take 1 tablet (20 mg total) by mouth every morning.  Essential hypertension, benign -     meclizine (ANTIVERT) 25 MG tablet; Take 1 tablet (25 mg total) by mouth 2 (two) times daily.  Gastroesophageal reflux disease, esophagitis presence not specified  Hyperlipidemia, unspecified hyperlipidemia type -     atorvastatin (LIPITOR) 20 MG tablet; Take 1 tablet (20 mg total) by mouth at bedtime.  Congestive heart failure, unspecified HF chronicity, unspecified heart failure type ([place]) -     DG Chest 2 View; Future  Other orders -     lisinopril (PRINIVIL,ZESTRIL) 20 MG tablet; Take 1 tablet (20 mg total) by mouth every morning. -     isosorbide mononitrate (IMDUR) 30 MG 24 hr tablet; Take 1 tablet (30 mg total) by mouth daily. -      Insulin Detemir (LEVEMIR FLEXTOUCH) 100 UNIT/ML Pen; Inject 24 Units into the skin every morning. -     furosemide (LASIX) 20 MG tablet; Take 1 tablet (20 mg total) by mouth daily. -     amLODipine (NORVASC) 10 MG tablet; Take 1 tablet (10 mg total) by mouth every morning. -  pantoprazole (PROTONIX) 40 MG tablet; Take 1 tablet (40 mg total) by mouth daily. For stomach   I have changed [name]'s PARoxetine, metoprolol tartrate, metFORMIN, meclizine, lisinopril, Insulin Detemir, clopidogrel, and amLODipine. I am also having her start on pantoprazole. Additionally, I am having her maintain her Artificial Tear Ointment (DRY EYES OP), Multiple Vitamins-Iron (DAILY VITAMINS/IRON/BETA CAROT PO), aspirin, nitroGLYCERIN, hydrocortisone, ferrous sulfate, blood glucose meter kit and supplies, esomeprazole, FORA LANCETS, glucose blood, insulin lispro, Insulin Pen Needle, Insulin Syringe-Needle U-100, polyethylene glycol, methenamine, albuterol, budesonide-formoterol, isosorbide mononitrate, furosemide, and atorvastatin.  Meds ordered this encounter  Medications  . PARoxetine (PAXIL) 20 MG tablet    Sig: Take 1 tablet (20 mg total) by mouth every morning.    Dispense:  90 tablet    Refill:  1  . metoprolol tartrate (LOPRESSOR) 50 MG tablet    Sig: Take 1 tablet (50 mg total) by mouth 2 (two) times daily.    Dispense:  180 tablet    Refill:  2  . metFORMIN (GLUCOPHAGE-XR) 500 MG 24 hr tablet    Sig: Take 1 tablet (500 mg total) by mouth every evening.    Dispense:  90 tablet    Refill:  2  . meclizine (ANTIVERT) 25 MG tablet    Sig: Take 1 tablet (25 mg total) by mouth 2 (two) times daily.    Dispense:  180 tablet    Refill:  2  . lisinopril (PRINIVIL,ZESTRIL) 20 MG tablet    Sig: Take 1 tablet (20 mg total) by mouth every morning.    Dispense:  90 tablet    Refill:  2  . isosorbide mononitrate (IMDUR) 30 MG 24 hr tablet    Sig: Take 1 tablet (30 mg total) by mouth daily.    Dispense:   90 tablet    Refill:  3  . Insulin Detemir (LEVEMIR FLEXTOUCH) 100 UNIT/ML Pen    Sig: Inject 24 Units into the skin every morning.    Dispense:  15 mL    Refill:  5  . furosemide (LASIX) 20 MG tablet    Sig: Take 1 tablet (20 mg total) by mouth daily.    Dispense:  60 tablet    Refill:  11  . clopidogrel (PLAVIX) 75 MG tablet    Sig: Take 1 tablet (75 mg total) by mouth every morning.    Dispense:  90 tablet    Refill:  2  . atorvastatin (LIPITOR) 20 MG tablet    Sig: Take 1 tablet (20 mg total) by mouth at bedtime.    Dispense:  90 tablet    Refill:  3  . amLODipine (NORVASC) 10 MG tablet    Sig: Take 1 tablet (10 mg total) by mouth every morning.    Dispense:  90 tablet    Refill:  2  . pantoprazole (PROTONIX) 40 MG tablet    Sig: Take 1 tablet (40 mg total) by mouth daily. For stomach    Dispense:  30 tablet    Refill:  11  I encouraged him to go forward with home health.  Daily weights and nurse observations are critical to determining her risk for recurrent congestive heart failure.   Follow-up: No follow-ups on file.  [name], M.D.

## 2018-10-19 LAB — CBC WITH DIFFERENTIAL/PLATELET
BASOS ABS: 0.1 10*3/uL (ref 0.0–0.2)
Basos: 1 %
EOS (ABSOLUTE): 0.2 10*3/uL (ref 0.0–0.4)
EOS: 3 %
HEMATOCRIT: 31.2 % — AB (ref 34.0–46.6)
HEMOGLOBIN: 10.5 g/dL — AB (ref 11.1–15.9)
IMMATURE GRANULOCYTES: 0 %
Immature Grans (Abs): 0 10*3/uL (ref 0.0–0.1)
Lymphocytes Absolute: 1.4 10*3/uL (ref 0.7–3.1)
Lymphs: 24 %
MCH: 28.9 pg (ref 26.6–33.0)
MCHC: 33.7 g/dL (ref 31.5–35.7)
MCV: 86 fL (ref 79–97)
MONOCYTES: 6 %
Monocytes Absolute: 0.4 10*3/uL (ref 0.1–0.9)
NEUTROS PCT: 66 %
Neutrophils Absolute: 3.8 10*3/uL (ref 1.4–7.0)
Platelets: 215 10*3/uL (ref 150–450)
RBC: 3.63 x10E6/uL — AB (ref 3.77–5.28)
RDW: 13.4 % (ref 12.3–15.4)
WBC: 5.9 10*3/uL (ref 3.4–10.8)

## 2018-10-19 LAB — CMP14+EGFR
ALBUMIN: 3.7 g/dL (ref 3.5–4.7)
ALK PHOS: 47 IU/L (ref 39–117)
ALT: 10 IU/L (ref 0–32)
AST: 12 IU/L (ref 0–40)
Albumin/Globulin Ratio: 1.4 (ref 1.2–2.2)
BUN/Creatinine Ratio: 19 (ref 12–28)
BUN: 26 mg/dL (ref 8–27)
Bilirubin Total: 0.3 mg/dL (ref 0.0–1.2)
CALCIUM: 9.7 mg/dL (ref 8.7–10.3)
CO2: 24 mmol/L (ref 20–29)
CREATININE: 1.38 mg/dL — AB (ref 0.57–1.00)
Chloride: 104 mmol/L (ref 96–106)
GFR calc Af Amer: 40 mL/min/{1.73_m2} — ABNORMAL LOW (ref >59)
GFR, EST NON AFRICAN AMERICAN: 35 mL/min/{1.73_m2} — AB (ref >59)
GLUCOSE: 105 mg/dL — AB (ref 65–99)
Globulin, Total: 2.7 g/dL (ref 1.5–4.5)
Potassium: 4.3 mmol/L (ref 3.5–5.2)
SODIUM: 143 mmol/L (ref 134–144)
Total Protein: 6.4 g/dL (ref 6.0–8.5)

## 2018-10-19 LAB — LIPID PANEL
CHOL/HDL RATIO: 1.8 ratio (ref 0.0–4.4)
CHOLESTEROL TOTAL: 150 mg/dL (ref 100–199)
HDL: 82 mg/dL (ref >39)
LDL Calculated: 42 mg/dL (ref 0–99)
TRIGLYCERIDES: 130 mg/dL (ref 0–149)
VLDL Cholesterol Cal: 26 mg/dL (ref 5–40)

## 2018-10-21 DIAGNOSIS — I080 Rheumatic disorders of both mitral and aortic valves: Secondary | ICD-10-CM | POA: Diagnosis not present

## 2018-10-21 DIAGNOSIS — I252 Old myocardial infarction: Secondary | ICD-10-CM | POA: Diagnosis not present

## 2018-10-21 DIAGNOSIS — J45909 Unspecified asthma, uncomplicated: Secondary | ICD-10-CM | POA: Diagnosis not present

## 2018-10-21 DIAGNOSIS — Z794 Long term (current) use of insulin: Secondary | ICD-10-CM | POA: Diagnosis not present

## 2018-10-21 DIAGNOSIS — Z951 Presence of aortocoronary bypass graft: Secondary | ICD-10-CM | POA: Diagnosis not present

## 2018-10-21 DIAGNOSIS — Z9861 Coronary angioplasty status: Secondary | ICD-10-CM | POA: Diagnosis not present

## 2018-10-21 DIAGNOSIS — I2510 Atherosclerotic heart disease of native coronary artery without angina pectoris: Secondary | ICD-10-CM | POA: Diagnosis not present

## 2018-10-21 DIAGNOSIS — E1122 Type 2 diabetes mellitus with diabetic chronic kidney disease: Secondary | ICD-10-CM | POA: Diagnosis not present

## 2018-10-21 DIAGNOSIS — M1712 Unilateral primary osteoarthritis, left knee: Secondary | ICD-10-CM | POA: Diagnosis not present

## 2018-10-21 DIAGNOSIS — H5462 Unqualified visual loss, left eye, normal vision right eye: Secondary | ICD-10-CM | POA: Diagnosis not present

## 2018-10-21 DIAGNOSIS — N183 Chronic kidney disease, stage 3 (moderate): Secondary | ICD-10-CM | POA: Diagnosis not present

## 2018-10-21 DIAGNOSIS — Z7982 Long term (current) use of aspirin: Secondary | ICD-10-CM | POA: Diagnosis not present

## 2018-10-21 DIAGNOSIS — I130 Hypertensive heart and chronic kidney disease with heart failure and stage 1 through stage 4 chronic kidney disease, or unspecified chronic kidney disease: Secondary | ICD-10-CM | POA: Diagnosis not present

## 2018-10-21 DIAGNOSIS — D631 Anemia in chronic kidney disease: Secondary | ICD-10-CM | POA: Diagnosis not present

## 2018-10-21 DIAGNOSIS — I5033 Acute on chronic diastolic (congestive) heart failure: Secondary | ICD-10-CM | POA: Diagnosis not present

## 2018-10-21 DIAGNOSIS — D509 Iron deficiency anemia, unspecified: Secondary | ICD-10-CM | POA: Diagnosis not present

## 2018-10-21 DIAGNOSIS — H9190 Unspecified hearing loss, unspecified ear: Secondary | ICD-10-CM | POA: Diagnosis not present

## 2018-10-21 DIAGNOSIS — Z96651 Presence of right artificial knee joint: Secondary | ICD-10-CM | POA: Diagnosis not present

## 2018-10-21 DIAGNOSIS — M419 Scoliosis, unspecified: Secondary | ICD-10-CM | POA: Diagnosis not present

## 2018-10-21 DIAGNOSIS — Z7902 Long term (current) use of antithrombotics/antiplatelets: Secondary | ICD-10-CM | POA: Diagnosis not present

## 2018-10-21 DIAGNOSIS — E785 Hyperlipidemia, unspecified: Secondary | ICD-10-CM | POA: Diagnosis not present

## 2018-10-21 DIAGNOSIS — M160 Bilateral primary osteoarthritis of hip: Secondary | ICD-10-CM | POA: Diagnosis not present

## 2018-10-24 DIAGNOSIS — M1712 Unilateral primary osteoarthritis, left knee: Secondary | ICD-10-CM | POA: Diagnosis not present

## 2018-10-24 DIAGNOSIS — H5462 Unqualified visual loss, left eye, normal vision right eye: Secondary | ICD-10-CM | POA: Diagnosis not present

## 2018-10-24 DIAGNOSIS — J45909 Unspecified asthma, uncomplicated: Secondary | ICD-10-CM | POA: Diagnosis not present

## 2018-10-24 DIAGNOSIS — Z96651 Presence of right artificial knee joint: Secondary | ICD-10-CM | POA: Diagnosis not present

## 2018-10-24 DIAGNOSIS — N183 Chronic kidney disease, stage 3 (moderate): Secondary | ICD-10-CM | POA: Diagnosis not present

## 2018-10-24 DIAGNOSIS — D509 Iron deficiency anemia, unspecified: Secondary | ICD-10-CM | POA: Diagnosis not present

## 2018-10-24 DIAGNOSIS — M419 Scoliosis, unspecified: Secondary | ICD-10-CM | POA: Diagnosis not present

## 2018-10-24 DIAGNOSIS — Z7902 Long term (current) use of antithrombotics/antiplatelets: Secondary | ICD-10-CM | POA: Diagnosis not present

## 2018-10-24 DIAGNOSIS — I2510 Atherosclerotic heart disease of native coronary artery without angina pectoris: Secondary | ICD-10-CM | POA: Diagnosis not present

## 2018-10-24 DIAGNOSIS — E1122 Type 2 diabetes mellitus with diabetic chronic kidney disease: Secondary | ICD-10-CM | POA: Diagnosis not present

## 2018-10-24 DIAGNOSIS — Z7982 Long term (current) use of aspirin: Secondary | ICD-10-CM | POA: Diagnosis not present

## 2018-10-24 DIAGNOSIS — Z951 Presence of aortocoronary bypass graft: Secondary | ICD-10-CM | POA: Diagnosis not present

## 2018-10-24 DIAGNOSIS — M160 Bilateral primary osteoarthritis of hip: Secondary | ICD-10-CM | POA: Diagnosis not present

## 2018-10-24 DIAGNOSIS — I080 Rheumatic disorders of both mitral and aortic valves: Secondary | ICD-10-CM | POA: Diagnosis not present

## 2018-10-24 DIAGNOSIS — H9190 Unspecified hearing loss, unspecified ear: Secondary | ICD-10-CM | POA: Diagnosis not present

## 2018-10-24 DIAGNOSIS — Z794 Long term (current) use of insulin: Secondary | ICD-10-CM | POA: Diagnosis not present

## 2018-10-24 DIAGNOSIS — I252 Old myocardial infarction: Secondary | ICD-10-CM | POA: Diagnosis not present

## 2018-10-24 DIAGNOSIS — D631 Anemia in chronic kidney disease: Secondary | ICD-10-CM | POA: Diagnosis not present

## 2018-10-24 DIAGNOSIS — I5033 Acute on chronic diastolic (congestive) heart failure: Secondary | ICD-10-CM | POA: Diagnosis not present

## 2018-10-24 DIAGNOSIS — I130 Hypertensive heart and chronic kidney disease with heart failure and stage 1 through stage 4 chronic kidney disease, or unspecified chronic kidney disease: Secondary | ICD-10-CM | POA: Diagnosis not present

## 2018-10-24 DIAGNOSIS — Z9861 Coronary angioplasty status: Secondary | ICD-10-CM | POA: Diagnosis not present

## 2018-10-24 DIAGNOSIS — E785 Hyperlipidemia, unspecified: Secondary | ICD-10-CM | POA: Diagnosis not present

## 2018-10-25 DIAGNOSIS — Z7982 Long term (current) use of aspirin: Secondary | ICD-10-CM | POA: Diagnosis not present

## 2018-10-25 DIAGNOSIS — I080 Rheumatic disorders of both mitral and aortic valves: Secondary | ICD-10-CM | POA: Diagnosis not present

## 2018-10-25 DIAGNOSIS — M160 Bilateral primary osteoarthritis of hip: Secondary | ICD-10-CM | POA: Diagnosis not present

## 2018-10-25 DIAGNOSIS — J45909 Unspecified asthma, uncomplicated: Secondary | ICD-10-CM | POA: Diagnosis not present

## 2018-10-25 DIAGNOSIS — D509 Iron deficiency anemia, unspecified: Secondary | ICD-10-CM | POA: Diagnosis not present

## 2018-10-25 DIAGNOSIS — Z7902 Long term (current) use of antithrombotics/antiplatelets: Secondary | ICD-10-CM | POA: Diagnosis not present

## 2018-10-25 DIAGNOSIS — Z951 Presence of aortocoronary bypass graft: Secondary | ICD-10-CM | POA: Diagnosis not present

## 2018-10-25 DIAGNOSIS — I252 Old myocardial infarction: Secondary | ICD-10-CM | POA: Diagnosis not present

## 2018-10-25 DIAGNOSIS — H9190 Unspecified hearing loss, unspecified ear: Secondary | ICD-10-CM | POA: Diagnosis not present

## 2018-10-25 DIAGNOSIS — E785 Hyperlipidemia, unspecified: Secondary | ICD-10-CM | POA: Diagnosis not present

## 2018-10-25 DIAGNOSIS — I2510 Atherosclerotic heart disease of native coronary artery without angina pectoris: Secondary | ICD-10-CM | POA: Diagnosis not present

## 2018-10-25 DIAGNOSIS — E1122 Type 2 diabetes mellitus with diabetic chronic kidney disease: Secondary | ICD-10-CM | POA: Diagnosis not present

## 2018-10-25 DIAGNOSIS — H5462 Unqualified visual loss, left eye, normal vision right eye: Secondary | ICD-10-CM | POA: Diagnosis not present

## 2018-10-25 DIAGNOSIS — N183 Chronic kidney disease, stage 3 (moderate): Secondary | ICD-10-CM | POA: Diagnosis not present

## 2018-10-25 DIAGNOSIS — M419 Scoliosis, unspecified: Secondary | ICD-10-CM | POA: Diagnosis not present

## 2018-10-25 DIAGNOSIS — Z96651 Presence of right artificial knee joint: Secondary | ICD-10-CM | POA: Diagnosis not present

## 2018-10-25 DIAGNOSIS — I5033 Acute on chronic diastolic (congestive) heart failure: Secondary | ICD-10-CM | POA: Diagnosis not present

## 2018-10-25 DIAGNOSIS — D631 Anemia in chronic kidney disease: Secondary | ICD-10-CM | POA: Diagnosis not present

## 2018-10-25 DIAGNOSIS — Z9861 Coronary angioplasty status: Secondary | ICD-10-CM | POA: Diagnosis not present

## 2018-10-25 DIAGNOSIS — M1712 Unilateral primary osteoarthritis, left knee: Secondary | ICD-10-CM | POA: Diagnosis not present

## 2018-10-25 DIAGNOSIS — Z794 Long term (current) use of insulin: Secondary | ICD-10-CM | POA: Diagnosis not present

## 2018-10-25 DIAGNOSIS — I130 Hypertensive heart and chronic kidney disease with heart failure and stage 1 through stage 4 chronic kidney disease, or unspecified chronic kidney disease: Secondary | ICD-10-CM | POA: Diagnosis not present

## 2018-10-28 ENCOUNTER — Ambulatory Visit (INDEPENDENT_AMBULATORY_CARE_PROVIDER_SITE_OTHER): Payer: Medicare Other | Admitting: Urology

## 2018-10-28 ENCOUNTER — Encounter: Payer: Self-pay | Admitting: *Deleted

## 2018-10-28 ENCOUNTER — Other Ambulatory Visit (HOSPITAL_COMMUNITY)
Admission: RE | Admit: 2018-10-28 | Discharge: 2018-10-28 | Disposition: A | Discharge status: Home / Self Care | Payer: Medicare Other | Source: Other Acute Inpatient Hospital | Attending: Urology | Admitting: Urology

## 2018-10-28 DIAGNOSIS — H5462 Unqualified visual loss, left eye, normal vision right eye: Secondary | ICD-10-CM | POA: Diagnosis not present

## 2018-10-28 DIAGNOSIS — I5033 Acute on chronic diastolic (congestive) heart failure: Secondary | ICD-10-CM | POA: Diagnosis not present

## 2018-10-28 DIAGNOSIS — N3946 Mixed incontinence: Secondary | ICD-10-CM | POA: Diagnosis not present

## 2018-10-28 DIAGNOSIS — M160 Bilateral primary osteoarthritis of hip: Secondary | ICD-10-CM | POA: Diagnosis not present

## 2018-10-28 DIAGNOSIS — Z794 Long term (current) use of insulin: Secondary | ICD-10-CM | POA: Diagnosis not present

## 2018-10-28 DIAGNOSIS — I252 Old myocardial infarction: Secondary | ICD-10-CM | POA: Diagnosis not present

## 2018-10-28 DIAGNOSIS — E1122 Type 2 diabetes mellitus with diabetic chronic kidney disease: Secondary | ICD-10-CM | POA: Diagnosis not present

## 2018-10-28 DIAGNOSIS — H9190 Unspecified hearing loss, unspecified ear: Secondary | ICD-10-CM | POA: Diagnosis not present

## 2018-10-28 DIAGNOSIS — Z9861 Coronary angioplasty status: Secondary | ICD-10-CM | POA: Diagnosis not present

## 2018-10-28 DIAGNOSIS — D631 Anemia in chronic kidney disease: Secondary | ICD-10-CM | POA: Diagnosis not present

## 2018-10-28 DIAGNOSIS — Z7982 Long term (current) use of aspirin: Secondary | ICD-10-CM | POA: Diagnosis not present

## 2018-10-28 DIAGNOSIS — E785 Hyperlipidemia, unspecified: Secondary | ICD-10-CM | POA: Diagnosis not present

## 2018-10-28 DIAGNOSIS — I130 Hypertensive heart and chronic kidney disease with heart failure and stage 1 through stage 4 chronic kidney disease, or unspecified chronic kidney disease: Secondary | ICD-10-CM | POA: Diagnosis not present

## 2018-10-28 DIAGNOSIS — M419 Scoliosis, unspecified: Secondary | ICD-10-CM | POA: Diagnosis not present

## 2018-10-28 DIAGNOSIS — I080 Rheumatic disorders of both mitral and aortic valves: Secondary | ICD-10-CM | POA: Diagnosis not present

## 2018-10-28 DIAGNOSIS — Z951 Presence of aortocoronary bypass graft: Secondary | ICD-10-CM | POA: Diagnosis not present

## 2018-10-28 DIAGNOSIS — N3020 Other chronic cystitis without hematuria: Secondary | ICD-10-CM

## 2018-10-28 DIAGNOSIS — I2510 Atherosclerotic heart disease of native coronary artery without angina pectoris: Secondary | ICD-10-CM | POA: Diagnosis not present

## 2018-10-28 DIAGNOSIS — N183 Chronic kidney disease, stage 3 (moderate): Secondary | ICD-10-CM | POA: Diagnosis not present

## 2018-10-28 DIAGNOSIS — D509 Iron deficiency anemia, unspecified: Secondary | ICD-10-CM | POA: Diagnosis not present

## 2018-10-28 DIAGNOSIS — M1712 Unilateral primary osteoarthritis, left knee: Secondary | ICD-10-CM | POA: Diagnosis not present

## 2018-10-28 DIAGNOSIS — J45909 Unspecified asthma, uncomplicated: Secondary | ICD-10-CM | POA: Diagnosis not present

## 2018-10-28 DIAGNOSIS — Z7902 Long term (current) use of antithrombotics/antiplatelets: Secondary | ICD-10-CM | POA: Diagnosis not present

## 2018-10-28 DIAGNOSIS — Z96651 Presence of right artificial knee joint: Secondary | ICD-10-CM | POA: Diagnosis not present

## 2018-10-30 LAB — URINE CULTURE

## 2018-10-31 DIAGNOSIS — D631 Anemia in chronic kidney disease: Secondary | ICD-10-CM | POA: Diagnosis not present

## 2018-10-31 DIAGNOSIS — M419 Scoliosis, unspecified: Secondary | ICD-10-CM | POA: Diagnosis not present

## 2018-10-31 DIAGNOSIS — I5033 Acute on chronic diastolic (congestive) heart failure: Secondary | ICD-10-CM | POA: Diagnosis not present

## 2018-10-31 DIAGNOSIS — Z7902 Long term (current) use of antithrombotics/antiplatelets: Secondary | ICD-10-CM | POA: Diagnosis not present

## 2018-10-31 DIAGNOSIS — M1712 Unilateral primary osteoarthritis, left knee: Secondary | ICD-10-CM | POA: Diagnosis not present

## 2018-10-31 DIAGNOSIS — I2510 Atherosclerotic heart disease of native coronary artery without angina pectoris: Secondary | ICD-10-CM | POA: Diagnosis not present

## 2018-10-31 DIAGNOSIS — D509 Iron deficiency anemia, unspecified: Secondary | ICD-10-CM | POA: Diagnosis not present

## 2018-10-31 DIAGNOSIS — I130 Hypertensive heart and chronic kidney disease with heart failure and stage 1 through stage 4 chronic kidney disease, or unspecified chronic kidney disease: Secondary | ICD-10-CM | POA: Diagnosis not present

## 2018-10-31 DIAGNOSIS — H9190 Unspecified hearing loss, unspecified ear: Secondary | ICD-10-CM | POA: Diagnosis not present

## 2018-10-31 DIAGNOSIS — I252 Old myocardial infarction: Secondary | ICD-10-CM | POA: Diagnosis not present

## 2018-10-31 DIAGNOSIS — I080 Rheumatic disorders of both mitral and aortic valves: Secondary | ICD-10-CM | POA: Diagnosis not present

## 2018-10-31 DIAGNOSIS — Z794 Long term (current) use of insulin: Secondary | ICD-10-CM | POA: Diagnosis not present

## 2018-10-31 DIAGNOSIS — Z951 Presence of aortocoronary bypass graft: Secondary | ICD-10-CM | POA: Diagnosis not present

## 2018-10-31 DIAGNOSIS — E1122 Type 2 diabetes mellitus with diabetic chronic kidney disease: Secondary | ICD-10-CM | POA: Diagnosis not present

## 2018-10-31 DIAGNOSIS — H5462 Unqualified visual loss, left eye, normal vision right eye: Secondary | ICD-10-CM | POA: Diagnosis not present

## 2018-10-31 DIAGNOSIS — E785 Hyperlipidemia, unspecified: Secondary | ICD-10-CM | POA: Diagnosis not present

## 2018-10-31 DIAGNOSIS — N183 Chronic kidney disease, stage 3 (moderate): Secondary | ICD-10-CM | POA: Diagnosis not present

## 2018-10-31 DIAGNOSIS — Z7982 Long term (current) use of aspirin: Secondary | ICD-10-CM | POA: Diagnosis not present

## 2018-10-31 DIAGNOSIS — Z96651 Presence of right artificial knee joint: Secondary | ICD-10-CM | POA: Diagnosis not present

## 2018-10-31 DIAGNOSIS — J45909 Unspecified asthma, uncomplicated: Secondary | ICD-10-CM | POA: Diagnosis not present

## 2018-10-31 DIAGNOSIS — M160 Bilateral primary osteoarthritis of hip: Secondary | ICD-10-CM | POA: Diagnosis not present

## 2018-10-31 DIAGNOSIS — Z9861 Coronary angioplasty status: Secondary | ICD-10-CM | POA: Diagnosis not present

## 2018-11-01 DIAGNOSIS — D631 Anemia in chronic kidney disease: Secondary | ICD-10-CM | POA: Diagnosis not present

## 2018-11-01 DIAGNOSIS — D509 Iron deficiency anemia, unspecified: Secondary | ICD-10-CM | POA: Diagnosis not present

## 2018-11-01 DIAGNOSIS — M419 Scoliosis, unspecified: Secondary | ICD-10-CM | POA: Diagnosis not present

## 2018-11-01 DIAGNOSIS — J45909 Unspecified asthma, uncomplicated: Secondary | ICD-10-CM | POA: Diagnosis not present

## 2018-11-01 DIAGNOSIS — H5462 Unqualified visual loss, left eye, normal vision right eye: Secondary | ICD-10-CM | POA: Diagnosis not present

## 2018-11-01 DIAGNOSIS — M1712 Unilateral primary osteoarthritis, left knee: Secondary | ICD-10-CM | POA: Diagnosis not present

## 2018-11-01 DIAGNOSIS — Z7902 Long term (current) use of antithrombotics/antiplatelets: Secondary | ICD-10-CM | POA: Diagnosis not present

## 2018-11-01 DIAGNOSIS — I080 Rheumatic disorders of both mitral and aortic valves: Secondary | ICD-10-CM | POA: Diagnosis not present

## 2018-11-01 DIAGNOSIS — I2510 Atherosclerotic heart disease of native coronary artery without angina pectoris: Secondary | ICD-10-CM | POA: Diagnosis not present

## 2018-11-01 DIAGNOSIS — Z951 Presence of aortocoronary bypass graft: Secondary | ICD-10-CM | POA: Diagnosis not present

## 2018-11-01 DIAGNOSIS — E785 Hyperlipidemia, unspecified: Secondary | ICD-10-CM | POA: Diagnosis not present

## 2018-11-01 DIAGNOSIS — E1122 Type 2 diabetes mellitus with diabetic chronic kidney disease: Secondary | ICD-10-CM | POA: Diagnosis not present

## 2018-11-01 DIAGNOSIS — Z96651 Presence of right artificial knee joint: Secondary | ICD-10-CM | POA: Diagnosis not present

## 2018-11-01 DIAGNOSIS — Z794 Long term (current) use of insulin: Secondary | ICD-10-CM | POA: Diagnosis not present

## 2018-11-01 DIAGNOSIS — I5033 Acute on chronic diastolic (congestive) heart failure: Secondary | ICD-10-CM | POA: Diagnosis not present

## 2018-11-01 DIAGNOSIS — N183 Chronic kidney disease, stage 3 (moderate): Secondary | ICD-10-CM | POA: Diagnosis not present

## 2018-11-01 DIAGNOSIS — I252 Old myocardial infarction: Secondary | ICD-10-CM | POA: Diagnosis not present

## 2018-11-01 DIAGNOSIS — Z9861 Coronary angioplasty status: Secondary | ICD-10-CM | POA: Diagnosis not present

## 2018-11-01 DIAGNOSIS — Z7982 Long term (current) use of aspirin: Secondary | ICD-10-CM | POA: Diagnosis not present

## 2018-11-01 DIAGNOSIS — M160 Bilateral primary osteoarthritis of hip: Secondary | ICD-10-CM | POA: Diagnosis not present

## 2018-11-01 DIAGNOSIS — I130 Hypertensive heart and chronic kidney disease with heart failure and stage 1 through stage 4 chronic kidney disease, or unspecified chronic kidney disease: Secondary | ICD-10-CM | POA: Diagnosis not present

## 2018-11-01 DIAGNOSIS — H9190 Unspecified hearing loss, unspecified ear: Secondary | ICD-10-CM | POA: Diagnosis not present

## 2018-11-04 DIAGNOSIS — D631 Anemia in chronic kidney disease: Secondary | ICD-10-CM | POA: Diagnosis not present

## 2018-11-04 DIAGNOSIS — J45909 Unspecified asthma, uncomplicated: Secondary | ICD-10-CM | POA: Diagnosis not present

## 2018-11-04 DIAGNOSIS — Z794 Long term (current) use of insulin: Secondary | ICD-10-CM | POA: Diagnosis not present

## 2018-11-04 DIAGNOSIS — Z7982 Long term (current) use of aspirin: Secondary | ICD-10-CM | POA: Diagnosis not present

## 2018-11-04 DIAGNOSIS — H5462 Unqualified visual loss, left eye, normal vision right eye: Secondary | ICD-10-CM | POA: Diagnosis not present

## 2018-11-04 DIAGNOSIS — E785 Hyperlipidemia, unspecified: Secondary | ICD-10-CM | POA: Diagnosis not present

## 2018-11-04 DIAGNOSIS — Z7902 Long term (current) use of antithrombotics/antiplatelets: Secondary | ICD-10-CM | POA: Diagnosis not present

## 2018-11-04 DIAGNOSIS — M160 Bilateral primary osteoarthritis of hip: Secondary | ICD-10-CM | POA: Diagnosis not present

## 2018-11-04 DIAGNOSIS — D509 Iron deficiency anemia, unspecified: Secondary | ICD-10-CM | POA: Diagnosis not present

## 2018-11-04 DIAGNOSIS — I080 Rheumatic disorders of both mitral and aortic valves: Secondary | ICD-10-CM | POA: Diagnosis not present

## 2018-11-04 DIAGNOSIS — N183 Chronic kidney disease, stage 3 (moderate): Secondary | ICD-10-CM | POA: Diagnosis not present

## 2018-11-04 DIAGNOSIS — I2510 Atherosclerotic heart disease of native coronary artery without angina pectoris: Secondary | ICD-10-CM | POA: Diagnosis not present

## 2018-11-04 DIAGNOSIS — I130 Hypertensive heart and chronic kidney disease with heart failure and stage 1 through stage 4 chronic kidney disease, or unspecified chronic kidney disease: Secondary | ICD-10-CM | POA: Diagnosis not present

## 2018-11-04 DIAGNOSIS — M419 Scoliosis, unspecified: Secondary | ICD-10-CM | POA: Diagnosis not present

## 2018-11-04 DIAGNOSIS — I5033 Acute on chronic diastolic (congestive) heart failure: Secondary | ICD-10-CM | POA: Diagnosis not present

## 2018-11-04 DIAGNOSIS — I252 Old myocardial infarction: Secondary | ICD-10-CM | POA: Diagnosis not present

## 2018-11-04 DIAGNOSIS — Z951 Presence of aortocoronary bypass graft: Secondary | ICD-10-CM | POA: Diagnosis not present

## 2018-11-04 DIAGNOSIS — H9190 Unspecified hearing loss, unspecified ear: Secondary | ICD-10-CM | POA: Diagnosis not present

## 2018-11-04 DIAGNOSIS — M1712 Unilateral primary osteoarthritis, left knee: Secondary | ICD-10-CM | POA: Diagnosis not present

## 2018-11-04 DIAGNOSIS — Z9861 Coronary angioplasty status: Secondary | ICD-10-CM | POA: Diagnosis not present

## 2018-11-04 DIAGNOSIS — E1122 Type 2 diabetes mellitus with diabetic chronic kidney disease: Secondary | ICD-10-CM | POA: Diagnosis not present

## 2018-11-04 DIAGNOSIS — Z96651 Presence of right artificial knee joint: Secondary | ICD-10-CM | POA: Diagnosis not present

## 2018-11-06 ENCOUNTER — Other Ambulatory Visit: Payer: Self-pay | Admitting: Family Medicine

## 2018-11-07 ENCOUNTER — Ambulatory Visit (INDEPENDENT_AMBULATORY_CARE_PROVIDER_SITE_OTHER): Payer: Medicare Other

## 2018-11-07 DIAGNOSIS — Z794 Long term (current) use of insulin: Secondary | ICD-10-CM | POA: Diagnosis not present

## 2018-11-07 DIAGNOSIS — I2510 Atherosclerotic heart disease of native coronary artery without angina pectoris: Secondary | ICD-10-CM | POA: Diagnosis not present

## 2018-11-07 DIAGNOSIS — I080 Rheumatic disorders of both mitral and aortic valves: Secondary | ICD-10-CM | POA: Diagnosis not present

## 2018-11-07 DIAGNOSIS — Z7902 Long term (current) use of antithrombotics/antiplatelets: Secondary | ICD-10-CM | POA: Diagnosis not present

## 2018-11-07 DIAGNOSIS — E1122 Type 2 diabetes mellitus with diabetic chronic kidney disease: Secondary | ICD-10-CM

## 2018-11-07 DIAGNOSIS — Z7982 Long term (current) use of aspirin: Secondary | ICD-10-CM | POA: Diagnosis not present

## 2018-11-07 DIAGNOSIS — Z96651 Presence of right artificial knee joint: Secondary | ICD-10-CM

## 2018-11-07 DIAGNOSIS — D631 Anemia in chronic kidney disease: Secondary | ICD-10-CM

## 2018-11-07 DIAGNOSIS — E785 Hyperlipidemia, unspecified: Secondary | ICD-10-CM

## 2018-11-07 DIAGNOSIS — Z8673 Personal history of transient ischemic attack (TIA), and cerebral infarction without residual deficits: Secondary | ICD-10-CM

## 2018-11-07 DIAGNOSIS — R269 Unspecified abnormalities of gait and mobility: Secondary | ICD-10-CM

## 2018-11-07 DIAGNOSIS — H9190 Unspecified hearing loss, unspecified ear: Secondary | ICD-10-CM

## 2018-11-07 DIAGNOSIS — Z951 Presence of aortocoronary bypass graft: Secondary | ICD-10-CM

## 2018-11-07 DIAGNOSIS — H5462 Unqualified visual loss, left eye, normal vision right eye: Secondary | ICD-10-CM | POA: Diagnosis not present

## 2018-11-07 DIAGNOSIS — J45909 Unspecified asthma, uncomplicated: Secondary | ICD-10-CM

## 2018-11-07 DIAGNOSIS — I252 Old myocardial infarction: Secondary | ICD-10-CM

## 2018-11-07 DIAGNOSIS — D509 Iron deficiency anemia, unspecified: Secondary | ICD-10-CM

## 2018-11-07 DIAGNOSIS — Z9861 Coronary angioplasty status: Secondary | ICD-10-CM

## 2018-11-07 DIAGNOSIS — I130 Hypertensive heart and chronic kidney disease with heart failure and stage 1 through stage 4 chronic kidney disease, or unspecified chronic kidney disease: Secondary | ICD-10-CM | POA: Diagnosis not present

## 2018-11-07 DIAGNOSIS — M1712 Unilateral primary osteoarthritis, left knee: Secondary | ICD-10-CM

## 2018-11-07 DIAGNOSIS — N183 Chronic kidney disease, stage 3 (moderate): Secondary | ICD-10-CM

## 2018-11-07 DIAGNOSIS — M160 Bilateral primary osteoarthritis of hip: Secondary | ICD-10-CM | POA: Diagnosis not present

## 2018-11-07 DIAGNOSIS — M419 Scoliosis, unspecified: Secondary | ICD-10-CM | POA: Diagnosis not present

## 2018-11-07 DIAGNOSIS — Z9181 History of falling: Secondary | ICD-10-CM

## 2018-11-07 DIAGNOSIS — I5033 Acute on chronic diastolic (congestive) heart failure: Secondary | ICD-10-CM | POA: Diagnosis not present

## 2018-11-07 DIAGNOSIS — F329 Major depressive disorder, single episode, unspecified: Secondary | ICD-10-CM

## 2018-11-07 DIAGNOSIS — F0390 Unspecified dementia without behavioral disturbance: Secondary | ICD-10-CM

## 2018-11-07 NOTE — Telephone Encounter (Signed)
Last seen 10/18/18  Dr Livia Snellen

## 2018-11-07 NOTE — Telephone Encounter (Signed)
Pt aware rx for lisinopril sent over and states she didn't need refill on Cipro.

## 2018-11-08 ENCOUNTER — Ambulatory Visit: Payer: Medicare Other | Admitting: Gastroenterology

## 2018-11-08 ENCOUNTER — Encounter: Payer: Self-pay | Admitting: Gastroenterology

## 2018-11-08 ENCOUNTER — Ambulatory Visit (INDEPENDENT_AMBULATORY_CARE_PROVIDER_SITE_OTHER): Payer: Medicare Other | Admitting: Gastroenterology

## 2018-11-08 VITALS — BP 131/65 | HR 57 | Temp 97.1°F | Ht 66.0 in | Wt 151.4 lb

## 2018-11-08 DIAGNOSIS — K6289 Other specified diseases of anus and rectum: Secondary | ICD-10-CM | POA: Diagnosis not present

## 2018-11-08 DIAGNOSIS — Z7902 Long term (current) use of antithrombotics/antiplatelets: Secondary | ICD-10-CM | POA: Diagnosis not present

## 2018-11-08 DIAGNOSIS — E1122 Type 2 diabetes mellitus with diabetic chronic kidney disease: Secondary | ICD-10-CM | POA: Diagnosis not present

## 2018-11-08 DIAGNOSIS — D509 Iron deficiency anemia, unspecified: Secondary | ICD-10-CM | POA: Diagnosis not present

## 2018-11-08 DIAGNOSIS — K5900 Constipation, unspecified: Secondary | ICD-10-CM | POA: Diagnosis not present

## 2018-11-08 DIAGNOSIS — Z96651 Presence of right artificial knee joint: Secondary | ICD-10-CM | POA: Diagnosis not present

## 2018-11-08 DIAGNOSIS — J45909 Unspecified asthma, uncomplicated: Secondary | ICD-10-CM | POA: Diagnosis not present

## 2018-11-08 DIAGNOSIS — R933 Abnormal findings on diagnostic imaging of other parts of digestive tract: Secondary | ICD-10-CM | POA: Diagnosis not present

## 2018-11-08 DIAGNOSIS — M160 Bilateral primary osteoarthritis of hip: Secondary | ICD-10-CM | POA: Diagnosis not present

## 2018-11-08 DIAGNOSIS — K625 Hemorrhage of anus and rectum: Secondary | ICD-10-CM | POA: Diagnosis not present

## 2018-11-08 DIAGNOSIS — H9190 Unspecified hearing loss, unspecified ear: Secondary | ICD-10-CM | POA: Diagnosis not present

## 2018-11-08 DIAGNOSIS — H5462 Unqualified visual loss, left eye, normal vision right eye: Secondary | ICD-10-CM | POA: Diagnosis not present

## 2018-11-08 DIAGNOSIS — N183 Chronic kidney disease, stage 3 (moderate): Secondary | ICD-10-CM | POA: Diagnosis not present

## 2018-11-08 DIAGNOSIS — M1712 Unilateral primary osteoarthritis, left knee: Secondary | ICD-10-CM | POA: Diagnosis not present

## 2018-11-08 DIAGNOSIS — E785 Hyperlipidemia, unspecified: Secondary | ICD-10-CM | POA: Diagnosis not present

## 2018-11-08 DIAGNOSIS — M419 Scoliosis, unspecified: Secondary | ICD-10-CM | POA: Diagnosis not present

## 2018-11-08 DIAGNOSIS — I080 Rheumatic disorders of both mitral and aortic valves: Secondary | ICD-10-CM | POA: Diagnosis not present

## 2018-11-08 DIAGNOSIS — D631 Anemia in chronic kidney disease: Secondary | ICD-10-CM | POA: Diagnosis not present

## 2018-11-08 DIAGNOSIS — Z9861 Coronary angioplasty status: Secondary | ICD-10-CM | POA: Diagnosis not present

## 2018-11-08 DIAGNOSIS — I252 Old myocardial infarction: Secondary | ICD-10-CM | POA: Diagnosis not present

## 2018-11-08 DIAGNOSIS — Z794 Long term (current) use of insulin: Secondary | ICD-10-CM | POA: Diagnosis not present

## 2018-11-08 DIAGNOSIS — I2510 Atherosclerotic heart disease of native coronary artery without angina pectoris: Secondary | ICD-10-CM | POA: Diagnosis not present

## 2018-11-08 DIAGNOSIS — I5033 Acute on chronic diastolic (congestive) heart failure: Secondary | ICD-10-CM | POA: Diagnosis not present

## 2018-11-08 DIAGNOSIS — Z7982 Long term (current) use of aspirin: Secondary | ICD-10-CM | POA: Diagnosis not present

## 2018-11-08 DIAGNOSIS — D649 Anemia, unspecified: Secondary | ICD-10-CM | POA: Diagnosis not present

## 2018-11-08 DIAGNOSIS — Z951 Presence of aortocoronary bypass graft: Secondary | ICD-10-CM | POA: Diagnosis not present

## 2018-11-08 DIAGNOSIS — I130 Hypertensive heart and chronic kidney disease with heart failure and stage 1 through stage 4 chronic kidney disease, or unspecified chronic kidney disease: Secondary | ICD-10-CM | POA: Diagnosis not present

## 2018-11-08 NOTE — Patient Instructions (Signed)
Restart Miralax once capful daily as needed to maintain soft stool. I would recommend taking a dose of miralax on days you have not had a good bowel movement.   I will review your scans with radiologist and discuss with Dr. Oneida Alar. Further recommendations to follow.

## 2018-11-08 NOTE — Progress Notes (Addendum)
Primary Care Physician:  [name], MD  Primary Gastroenterologist:  Barney Drain, MD  REVIEWED-pt needs TCS FOR RECTAL BLEEDING/ABNL CT.  Chief Complaint  Patient presents with  . Rectal Bleeding  . Hemorrhoids  . Constipation    HPI:  [name] is a 84 y.o. female here at the request of Dr. Livia Snellen for further evaluation of rectal bleeding, rectal pain, abnormal transverse colon on CT.  Patient presented to the ED in August with complaints of rectal bleeding.  Reported migratory abdominal pain for couple years.  Rectal bleeding for several months which she felt was related to hemorrhoids.  Daughter to the ED because they were concerned that maybe she was not having rectal bleeding and actually having vaginal bleeding given her prior history of uterine cancer they were concerned.  On bimanual exam there is no evidence of bleeding, no external or internal hemorrhoids noted on anoscopy, no gross blood seen.  She had a CT abdomen pelvis with contrast showed large amount of retained stool without bowel obstruction, focal transverse colon wall thickening and marked peristalsis or small mass.  Patient's last colonoscopy was in 2005, she had diverticulosis.  In 2002 she had a colonic adenoma removed.  Daughter states that patient has pain pretty much all over due to arthritis, compression fractures in her back.  Patient points to the lower abdomen describing pain and radiates around the left flank in particular.  She reportedly has had good bowel movements lately for the past 2 to 3 weeks.  Notably on multiple imaging studies through the year she has a large amount of retained stool.  Patient reports rectal pain feels swelling inside the rectum, denies any recent rectal bleeding but was having it several months ago.  Blood was dark red like "a menstrual cycle."  Her appetite is good.  Her heartburn is well controlled on Nexium.  If she misses a few days on the medication, her reflux comes  back.  Has a history of recurrent UTI, takes methenamine for prophylaxis.  Looking back through epic, CT abdomen pelvis with contrast in 2016 showed equivocal colonic wall thickening involving the transverse colon versus nondistention, nondistention favored given lack of surrounding inflammatory change.  Patient would like to avoid colonoscopy if possible given her age and comorbidities.  Patient with recent hospitalization, discharged on November fourth 2019 acute on chronic diastolic CHF.  Current Outpatient Medications  Medication Sig Dispense Refill  . albuterol (PROVENTIL HFA;VENTOLIN HFA) 108 (90 Base) MCG/ACT inhaler Inhale 2 puffs into the lungs every 6 (six) hours as needed for wheezing or shortness of breath. 1 Inhaler 0  . amLODipine (NORVASC) 10 MG tablet Take 1 tablet (10 mg total) by mouth every morning. 90 tablet 2  . Artificial Tear Ointment (DRY EYES OP) Apply 1 drop to eye daily as needed (dry/red eyes).     Marland Kitchen aspirin 81 MG chewable tablet Chew 81 mg by mouth at bedtime.    Marland Kitchen atorvastatin (LIPITOR) 20 MG tablet Take 1 tablet (20 mg total) by mouth at bedtime. 90 tablet 3  . blood glucose meter kit and supplies KIT Dispense based on insurance coverage. Use 4 times a day as directed Dx: E11.9, Z79.4 1 each 0  . budesonide-formoterol (SYMBICORT) 80-4.5 MCG/ACT inhaler Inhale 2 puffs into the lungs 2 (two) times daily. (Patient taking differently: Inhale 2 puffs into the lungs as needed. ) 1 Inhaler 3  . clopidogrel (PLAVIX) 75 MG tablet Take 1 tablet (75 mg total) by mouth every  morning. 90 tablet 2  . esomeprazole (NEXIUM) 40 MG capsule Take 1 capsule (40 mg total) by mouth daily. 30 capsule 5  . ferrous sulfate 325 (65 FE) MG tablet TAKE 1 TABLET 3 TIMES A DAY AFTER MEALS. (Patient taking differently: Take 325 mg by mouth daily at 12 noon. ) 90 tablet 4  . FORA LANCETS MISC Test blood sugars twice daily 200 each 3  . furosemide (LASIX) 20 MG tablet Take 1 tablet (20 mg total) by  mouth daily. 60 tablet 11  . glucose blood (ACCU-CHEK AVIVA) test strip Use to check blood glucose twice a day 200 each 3  . hydrocortisone (ANUSOL-HC) 25 MG suppository Place 1 suppository (25 mg total) rectally 2 (two) times daily. (Patient taking differently: Place 25 mg rectally 2 (two) times daily as needed for hemorrhoids or anal itching. ) 12 suppository 0  . Insulin Detemir (LEVEMIR FLEXTOUCH) 100 UNIT/ML Pen Inject 24 Units into the skin every morning. 15 mL 5  . insulin lispro (HUMALOG KWIKPEN) 100 UNIT/ML KiwkPen Inject 0.04-0.1 mLs (4-10 Units total) into the skin daily as needed (high blood sugar). 15 mL 5  . Insulin Pen Needle (GLOBAL EASE INJECT PEN NEEDLES) 31G X 8 MM MISC AS DIRECTED 100 each 2  . Insulin Syringe-Needle U-100 (EASY TOUCH INSULIN SYRINGE) 31G X 5/16" 1 ML MISC USE AS DIRECTED. 80 each 2  . isosorbide mononitrate (IMDUR) 30 MG 24 hr tablet Take 1 tablet (30 mg total) by mouth daily. 90 tablet 3  . lisinopril (PRINIVIL,ZESTRIL) 20 MG tablet Take 1 tablet (20 mg total) by mouth every morning. 90 tablet 2  . lisinopril (PRINIVIL,ZESTRIL) 20 MG tablet TAKE 1 TABLET BY MOUTH ONCE DAILY. 30 tablet 5  . meclizine (ANTIVERT) 25 MG tablet Take 1 tablet (25 mg total) by mouth 2 (two) times daily. 180 tablet 2  . metFORMIN (GLUCOPHAGE-XR) 500 MG 24 hr tablet Take 1 tablet (500 mg total) by mouth every evening. 90 tablet 2  . methenamine (HIPREX) 1 g tablet Take 1 tablet (1 g total) by mouth 2 (two) times daily. 60 tablet 5  . metoprolol tartrate (LOPRESSOR) 50 MG tablet Take 1 tablet (50 mg total) by mouth 2 (two) times daily. 180 tablet 2  . Multiple Vitamins-Iron (DAILY VITAMINS/IRON/BETA CAROT PO) Take 1 tablet by mouth at bedtime.    . nitroGLYCERIN (NITROSTAT) 0.4 MG SL tablet PLACE ONE (1) TABLET UNDER TONGUE EVERY 5 MINUTES UP TO (3) DOSES AS NEEDED FOR CHEST PAIN. (Patient taking differently: Place 0.4 mg under the tongue every 5 (five) minutes as needed for chest pain. )  25 tablet 2  . pantoprazole (PROTONIX) 40 MG tablet Take 1 tablet (40 mg total) by mouth daily. For stomach 30 tablet 11  . PARoxetine (PAXIL) 20 MG tablet Take 1 tablet (20 mg total) by mouth every morning. 90 tablet 1  . polyethylene glycol (MIRALAX) packet Take 17 g by mouth daily. (Patient taking differently: Take 17 g by mouth daily as needed for mild constipation or moderate constipation. ) 30 each 0   No current facility-administered medications for this visit.     Allergies as of 11/08/2018 - Review Complete 11/08/2018  Allergen Reaction Noted  . Advil [ibuprofen] Swelling 10/17/2015  . Heparin Other (See Comments) 03/06/2015  . Propoxyphene n-acetaminophen Other (See Comments)     Past Medical History:  Diagnosis Date  . Anemia   . Anxiety   . Aortic stenosis   . Arthritis    back,  knees, and hips  . Asthma    with allergies  . Balance problems   . CAD (coronary artery disease)   . Carotid stenosis   . CHF (congestive heart failure) ([place])    EF preserved Echo 2012  . CKD (chronic kidney disease)   . 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 Dementia ([place])    "a little"  . Depression   . Diabetes mellitus    type 2  . GERD (gastroesophageal reflux disease)   . Hearing loss   . Hypertension   . Iron deficiency anemia 05/27/2011  . Myocardial infarction ([place])   . Renal insufficiency   . Scoliosis   . Shortness of breath dyspnea    with activity  . Vertigo    "when sugar gets low"  . Vision loss    left eye-"half blind"    Past Surgical History:  Procedure Laterality Date  . APPENDECTOMY     with hysterectomy  . CATARACT EXTRACTION Bilateral 5 years ago  . CHOLECYSTECTOMY    . COLONOSCOPY  2005   Dr. Amedeo Plenty: Diverticulosis  . CORONARY ANGIOPLASTY     prior to 2006 5 stents  . CORONARY ARTERY BYPASS GRAFT  2006  . I & D of Furuncle  April 2013  . KNEE SURGERY Right   .  TOTAL ABDOMINAL HYSTERECTOMY  ~84 years old   complete, with tumor removal  . TOTAL KNEE ARTHROPLASTY Right 08/13/2015   Procedure: RIGHT  TOTAL KNEE ARTHROPLASTY;  Surgeon: Paralee Cancel, MD;  Location: [facility];  Service: Orthopedics;  Laterality: Right;    Family History  Problem Relation Age of Onset  . Cancer Brother        porstate  . Early death Sister   . Heart disease Brother   . Heart disease Brother   . Heart attack Brother   . Heart disease Brother   . Heart attack Brother   . Diabetes Sister   . Heart attack Sister   . Diabetes Sister   . Osteoporosis Sister   . Hypertension Sister   . Heart attack Son   . Early death Son   . Pancreatitis Son   . Heart attack Daughter 8    Social History   Socioeconomic History  . Marital status: Widowed    Spouse name: Not on file  . Number of children: 4  . Years of education: 3  . Highest education level: 3rd grade  Occupational History  . Occupation: retired  Scientific laboratory technician  . Financial resource strain: Not very hard  . Food insecurity:    Worry: Never true    Inability: Never true  . Transportation needs:    Medical: No    Non-medical: No  Tobacco Use  . Smoking status: Never Smoker  . Smokeless tobacco: Never Used  Substance and Sexual Activity  . Alcohol use: No  . Drug use: No  . Sexual activity: Not Currently  Lifestyle  . Physical activity:    Days per week: 0 days    Minutes per session: 0 min  . Stress: To some extent  Relationships  . Social connections:    Talks on phone: More than three times a week    Gets together: More than three times a week    Attends religious service: More than 4 times per year    Active member of club or organization: No  Attends meetings of clubs or organizations: Never    Relationship status: Widowed  . Intimate partner violence:    Fear of current or ex partner: No    Emotionally abused: No    Physically abused: No    Forced sexual activity: No  Other Topics  Concern  . Not on file  Social History Narrative  . Not on file      ROS:  General: Negative for anorexia, weight loss, fever, chills, fatigue., positive weakness. Eyes: Negative for vision changes.  ENT: Negative for hoarseness, difficulty swallowing , nasal congestion. CV: Negative for chest pain, angina, palpitations, dyspnea on exertion, peripheral edema.  Respiratory: Negative for dyspnea at rest, dyspnea on exertion, cough, sputum, wheezing.  GI: See history of present illness. GU:  Negative for dysuria, hematuria, urinary incontinence, urinary frequency, nocturnal urination.  MS: Chronic pain  Derm: Negative for rash or itching.  Neuro: Negative for weakness, abnormal sensation, seizure, frequent headaches, memory loss, confusion.  Psych: Negative for anxiety, depression, suicidal ideation, hallucinations.  Endo: Negative for unusual weight change.  Heme: Negative for bruising or bleeding. Allergy: Negative for rash or hives.    Physical Examination:  BP 131/65   Pulse (!) 57   Temp (!) 97.1 F (36.2 C) (Oral)   Ht _0  (1.676 m)   Wt 151 lb 6.4 oz (68.7 kg)   BMI 24.44 kg/m    General: Chronically ill-appearing elderly female, very pleasant, no acute distress.  Accompanied by daughter..  Head: Normocephalic, atraumatic.   Eyes: Conjunctiva pink, no icterus. Mouth: Oropharyngeal mucosa moist and pink , no lesions erythema or exudate. Neck: Supple without thyromegaly, masses, or lymphadenopathy.  Lungs: Clear to auscultation bilaterally.  Heart: Regular rate and rhythm,.  3 out of 6 systolic ejection murmur Abdomen: Bowel sounds are normal, nontender, nondistended, no hepatosplenomegaly or masses, no abdominal bruits or    hernia , no rebound or guarding.   Rectal: No external lesions, adequate sphincter tone.  Likely palpable anal canal hemorrhoids.  Stool soft, brown, heme-negative. Extremities: No lower extremity edema. No clubbing or deformities.  Neuro: Alert  and oriented x 4 , grossly normal neurologically.  Skin: Warm and dry, no rash or jaundice.   Psych: Alert and cooperative, normal mood and affect.  Labs: Lab Results  Component Value Date   CREATININE 1.38 (H) 10/18/2018   BUN 26 10/18/2018   NA 143 10/18/2018   K 4.3 10/18/2018   CL 104 10/18/2018   CO2 24 10/18/2018   Lab Results  Component Value Date   ALT 10 10/18/2018   AST 12 10/18/2018   ALKPHOS 47 10/18/2018   BILITOT 0.3 10/18/2018   Lab Results  Component Value Date   WBC 5.9 10/18/2018   HGB 10.5 (L) 10/18/2018   HCT 31.2 (L) 10/18/2018   MCV 86 10/18/2018   PLT 215 10/18/2018   Lab Results  Component Value Date   IRON 53 11/23/2017   TIBC 306 11/23/2017   FERRITIN 64 11/23/2017   Lab Results  Component Value Date   AYTKZSWF09 323 11/23/2017    Lab Results  Component Value Date   FOLATE 15.9 11/23/2017     Imaging Studies: Dg Chest 2 View  Result Date: 10/18/2018 CLINICAL DATA:  Follow-up EXAM: CHEST - 2 VIEW COMPARISON:  10/07/2018 FINDINGS: There is hyperinflation of the lungs compatible with COPD. Prior CABG. Heart is normal size. Small right pleural effusion and minimal residual right base atelectasis. Improving bibasilar opacities. Left basilar opacities  have completely resolved. No acute bony abnormality. IMPRESSION: Small right pleural effusion. Minimal residual right base atelectasis. Improved aeration in the lung bases. COPD. Electronically Signed   By: Rolm Baptise M.D.   On: 10/18/2018 12:24

## 2018-11-09 ENCOUNTER — Encounter: Payer: Self-pay | Admitting: Gastroenterology

## 2018-11-09 ENCOUNTER — Other Ambulatory Visit: Payer: Self-pay | Admitting: *Deleted

## 2018-11-09 DIAGNOSIS — Z96651 Presence of right artificial knee joint: Secondary | ICD-10-CM | POA: Diagnosis not present

## 2018-11-09 DIAGNOSIS — N183 Chronic kidney disease, stage 3 (moderate): Secondary | ICD-10-CM | POA: Diagnosis not present

## 2018-11-09 DIAGNOSIS — Z794 Long term (current) use of insulin: Secondary | ICD-10-CM | POA: Diagnosis not present

## 2018-11-09 DIAGNOSIS — J45909 Unspecified asthma, uncomplicated: Secondary | ICD-10-CM | POA: Diagnosis not present

## 2018-11-09 DIAGNOSIS — I252 Old myocardial infarction: Secondary | ICD-10-CM | POA: Diagnosis not present

## 2018-11-09 DIAGNOSIS — Z7982 Long term (current) use of aspirin: Secondary | ICD-10-CM | POA: Diagnosis not present

## 2018-11-09 DIAGNOSIS — H5462 Unqualified visual loss, left eye, normal vision right eye: Secondary | ICD-10-CM | POA: Diagnosis not present

## 2018-11-09 DIAGNOSIS — M1712 Unilateral primary osteoarthritis, left knee: Secondary | ICD-10-CM | POA: Diagnosis not present

## 2018-11-09 DIAGNOSIS — I2510 Atherosclerotic heart disease of native coronary artery without angina pectoris: Secondary | ICD-10-CM | POA: Diagnosis not present

## 2018-11-09 DIAGNOSIS — E785 Hyperlipidemia, unspecified: Secondary | ICD-10-CM | POA: Diagnosis not present

## 2018-11-09 DIAGNOSIS — Z9861 Coronary angioplasty status: Secondary | ICD-10-CM | POA: Diagnosis not present

## 2018-11-09 DIAGNOSIS — D631 Anemia in chronic kidney disease: Secondary | ICD-10-CM | POA: Diagnosis not present

## 2018-11-09 DIAGNOSIS — I130 Hypertensive heart and chronic kidney disease with heart failure and stage 1 through stage 4 chronic kidney disease, or unspecified chronic kidney disease: Secondary | ICD-10-CM | POA: Diagnosis not present

## 2018-11-09 DIAGNOSIS — Z7902 Long term (current) use of antithrombotics/antiplatelets: Secondary | ICD-10-CM | POA: Diagnosis not present

## 2018-11-09 DIAGNOSIS — M419 Scoliosis, unspecified: Secondary | ICD-10-CM | POA: Diagnosis not present

## 2018-11-09 DIAGNOSIS — E1122 Type 2 diabetes mellitus with diabetic chronic kidney disease: Secondary | ICD-10-CM | POA: Diagnosis not present

## 2018-11-09 DIAGNOSIS — I5033 Acute on chronic diastolic (congestive) heart failure: Secondary | ICD-10-CM | POA: Diagnosis not present

## 2018-11-09 DIAGNOSIS — H9190 Unspecified hearing loss, unspecified ear: Secondary | ICD-10-CM | POA: Diagnosis not present

## 2018-11-09 DIAGNOSIS — M160 Bilateral primary osteoarthritis of hip: Secondary | ICD-10-CM | POA: Diagnosis not present

## 2018-11-09 DIAGNOSIS — I080 Rheumatic disorders of both mitral and aortic valves: Secondary | ICD-10-CM | POA: Diagnosis not present

## 2018-11-09 DIAGNOSIS — Z951 Presence of aortocoronary bypass graft: Secondary | ICD-10-CM | POA: Diagnosis not present

## 2018-11-09 DIAGNOSIS — K219 Gastro-esophageal reflux disease without esophagitis: Secondary | ICD-10-CM

## 2018-11-09 DIAGNOSIS — D509 Iron deficiency anemia, unspecified: Secondary | ICD-10-CM | POA: Diagnosis not present

## 2018-11-09 MED ORDER — INSULIN DETEMIR 100 UNIT/ML FLEXPEN: 24.0000 [IU] | PEN_INJECTOR | Freq: Every morning | SUBCUTANEOUS | 0 refills | Status: DC

## 2018-11-09 MED ORDER — NITROGLYCERIN 0.4 MG SL SUBL: 0.4000 mg | SUBLINGUAL_TABLET | SUBLINGUAL | 0 refills | Status: DC | PRN

## 2018-11-09 MED ORDER — ESOMEPRAZOLE MAGNESIUM 40 MG PO CPDR: 40.0000 mg | DELAYED_RELEASE_CAPSULE | Freq: Every day | ORAL | 5 refills | Status: DC

## 2018-11-09 NOTE — Assessment & Plan Note (Signed)
84 year old female presenting for further evaluation of recent rectal bleeding, rectal pain, abnormal transverse colon seen on CT, constipation.  She also has chronic anemia, no evidence of IDA on labs.  Remote colonoscopy, 2005.  Patient has been reluctant in pursuing colonoscopy for evaluation of her symptoms and abnormal CT given her comorbidities.  Interestingly previous CT scans have reported possible colon wall thickening of the transverse colon dating back at least 2016.  Will review studies with radiology.  Will discuss with Dr. Oneida Alar.  Further recommendations to follow.  Patient would consider pursuing colonoscopy only if necessary.  Would recommend adding MiraLAX, 1 capful every night on days she does not have good bowel movement.

## 2018-11-09 NOTE — Progress Notes (Signed)
CC'D TO PCP °

## 2018-11-10 DIAGNOSIS — H9190 Unspecified hearing loss, unspecified ear: Secondary | ICD-10-CM | POA: Diagnosis not present

## 2018-11-10 DIAGNOSIS — Z951 Presence of aortocoronary bypass graft: Secondary | ICD-10-CM | POA: Diagnosis not present

## 2018-11-10 DIAGNOSIS — D631 Anemia in chronic kidney disease: Secondary | ICD-10-CM | POA: Diagnosis not present

## 2018-11-10 DIAGNOSIS — Z7902 Long term (current) use of antithrombotics/antiplatelets: Secondary | ICD-10-CM | POA: Diagnosis not present

## 2018-11-10 DIAGNOSIS — D509 Iron deficiency anemia, unspecified: Secondary | ICD-10-CM | POA: Diagnosis not present

## 2018-11-10 DIAGNOSIS — Z7982 Long term (current) use of aspirin: Secondary | ICD-10-CM | POA: Diagnosis not present

## 2018-11-10 DIAGNOSIS — Z96651 Presence of right artificial knee joint: Secondary | ICD-10-CM | POA: Diagnosis not present

## 2018-11-10 DIAGNOSIS — I2510 Atherosclerotic heart disease of native coronary artery without angina pectoris: Secondary | ICD-10-CM | POA: Diagnosis not present

## 2018-11-10 DIAGNOSIS — M160 Bilateral primary osteoarthritis of hip: Secondary | ICD-10-CM | POA: Diagnosis not present

## 2018-11-10 DIAGNOSIS — Z9861 Coronary angioplasty status: Secondary | ICD-10-CM | POA: Diagnosis not present

## 2018-11-10 DIAGNOSIS — J45909 Unspecified asthma, uncomplicated: Secondary | ICD-10-CM | POA: Diagnosis not present

## 2018-11-10 DIAGNOSIS — I080 Rheumatic disorders of both mitral and aortic valves: Secondary | ICD-10-CM | POA: Diagnosis not present

## 2018-11-10 DIAGNOSIS — M1712 Unilateral primary osteoarthritis, left knee: Secondary | ICD-10-CM | POA: Diagnosis not present

## 2018-11-10 DIAGNOSIS — N183 Chronic kidney disease, stage 3 (moderate): Secondary | ICD-10-CM | POA: Diagnosis not present

## 2018-11-10 DIAGNOSIS — M419 Scoliosis, unspecified: Secondary | ICD-10-CM | POA: Diagnosis not present

## 2018-11-10 DIAGNOSIS — I130 Hypertensive heart and chronic kidney disease with heart failure and stage 1 through stage 4 chronic kidney disease, or unspecified chronic kidney disease: Secondary | ICD-10-CM | POA: Diagnosis not present

## 2018-11-10 DIAGNOSIS — Z794 Long term (current) use of insulin: Secondary | ICD-10-CM | POA: Diagnosis not present

## 2018-11-10 DIAGNOSIS — E785 Hyperlipidemia, unspecified: Secondary | ICD-10-CM | POA: Diagnosis not present

## 2018-11-10 DIAGNOSIS — I252 Old myocardial infarction: Secondary | ICD-10-CM | POA: Diagnosis not present

## 2018-11-10 DIAGNOSIS — H5462 Unqualified visual loss, left eye, normal vision right eye: Secondary | ICD-10-CM | POA: Diagnosis not present

## 2018-11-10 DIAGNOSIS — I5033 Acute on chronic diastolic (congestive) heart failure: Secondary | ICD-10-CM | POA: Diagnosis not present

## 2018-11-10 DIAGNOSIS — E1122 Type 2 diabetes mellitus with diabetic chronic kidney disease: Secondary | ICD-10-CM | POA: Diagnosis not present

## 2018-11-11 DIAGNOSIS — M1712 Unilateral primary osteoarthritis, left knee: Secondary | ICD-10-CM | POA: Diagnosis not present

## 2018-11-11 DIAGNOSIS — Z951 Presence of aortocoronary bypass graft: Secondary | ICD-10-CM | POA: Diagnosis not present

## 2018-11-11 DIAGNOSIS — Z7902 Long term (current) use of antithrombotics/antiplatelets: Secondary | ICD-10-CM | POA: Diagnosis not present

## 2018-11-11 DIAGNOSIS — I130 Hypertensive heart and chronic kidney disease with heart failure and stage 1 through stage 4 chronic kidney disease, or unspecified chronic kidney disease: Secondary | ICD-10-CM | POA: Diagnosis not present

## 2018-11-11 DIAGNOSIS — Z96651 Presence of right artificial knee joint: Secondary | ICD-10-CM | POA: Diagnosis not present

## 2018-11-11 DIAGNOSIS — E785 Hyperlipidemia, unspecified: Secondary | ICD-10-CM | POA: Diagnosis not present

## 2018-11-11 DIAGNOSIS — Z9861 Coronary angioplasty status: Secondary | ICD-10-CM | POA: Diagnosis not present

## 2018-11-11 DIAGNOSIS — I252 Old myocardial infarction: Secondary | ICD-10-CM | POA: Diagnosis not present

## 2018-11-11 DIAGNOSIS — H5462 Unqualified visual loss, left eye, normal vision right eye: Secondary | ICD-10-CM | POA: Diagnosis not present

## 2018-11-11 DIAGNOSIS — I2510 Atherosclerotic heart disease of native coronary artery without angina pectoris: Secondary | ICD-10-CM | POA: Diagnosis not present

## 2018-11-11 DIAGNOSIS — I5033 Acute on chronic diastolic (congestive) heart failure: Secondary | ICD-10-CM | POA: Diagnosis not present

## 2018-11-11 DIAGNOSIS — Z794 Long term (current) use of insulin: Secondary | ICD-10-CM | POA: Diagnosis not present

## 2018-11-11 DIAGNOSIS — I080 Rheumatic disorders of both mitral and aortic valves: Secondary | ICD-10-CM | POA: Diagnosis not present

## 2018-11-11 DIAGNOSIS — D631 Anemia in chronic kidney disease: Secondary | ICD-10-CM | POA: Diagnosis not present

## 2018-11-11 DIAGNOSIS — D509 Iron deficiency anemia, unspecified: Secondary | ICD-10-CM | POA: Diagnosis not present

## 2018-11-11 DIAGNOSIS — M160 Bilateral primary osteoarthritis of hip: Secondary | ICD-10-CM | POA: Diagnosis not present

## 2018-11-11 DIAGNOSIS — J45909 Unspecified asthma, uncomplicated: Secondary | ICD-10-CM | POA: Diagnosis not present

## 2018-11-11 DIAGNOSIS — N183 Chronic kidney disease, stage 3 (moderate): Secondary | ICD-10-CM | POA: Diagnosis not present

## 2018-11-11 DIAGNOSIS — H9190 Unspecified hearing loss, unspecified ear: Secondary | ICD-10-CM | POA: Diagnosis not present

## 2018-11-11 DIAGNOSIS — E1122 Type 2 diabetes mellitus with diabetic chronic kidney disease: Secondary | ICD-10-CM | POA: Diagnosis not present

## 2018-11-11 DIAGNOSIS — Z7982 Long term (current) use of aspirin: Secondary | ICD-10-CM | POA: Diagnosis not present

## 2018-11-11 DIAGNOSIS — M419 Scoliosis, unspecified: Secondary | ICD-10-CM | POA: Diagnosis not present

## 2018-11-14 DIAGNOSIS — I2510 Atherosclerotic heart disease of native coronary artery without angina pectoris: Secondary | ICD-10-CM | POA: Diagnosis not present

## 2018-11-14 DIAGNOSIS — Z7902 Long term (current) use of antithrombotics/antiplatelets: Secondary | ICD-10-CM | POA: Diagnosis not present

## 2018-11-14 DIAGNOSIS — I5033 Acute on chronic diastolic (congestive) heart failure: Secondary | ICD-10-CM | POA: Diagnosis not present

## 2018-11-14 DIAGNOSIS — I252 Old myocardial infarction: Secondary | ICD-10-CM | POA: Diagnosis not present

## 2018-11-14 DIAGNOSIS — E785 Hyperlipidemia, unspecified: Secondary | ICD-10-CM | POA: Diagnosis not present

## 2018-11-14 DIAGNOSIS — M160 Bilateral primary osteoarthritis of hip: Secondary | ICD-10-CM | POA: Diagnosis not present

## 2018-11-14 DIAGNOSIS — Z96651 Presence of right artificial knee joint: Secondary | ICD-10-CM | POA: Diagnosis not present

## 2018-11-14 DIAGNOSIS — H9190 Unspecified hearing loss, unspecified ear: Secondary | ICD-10-CM | POA: Diagnosis not present

## 2018-11-14 DIAGNOSIS — D631 Anemia in chronic kidney disease: Secondary | ICD-10-CM | POA: Diagnosis not present

## 2018-11-14 DIAGNOSIS — N183 Chronic kidney disease, stage 3 (moderate): Secondary | ICD-10-CM | POA: Diagnosis not present

## 2018-11-14 DIAGNOSIS — Z7982 Long term (current) use of aspirin: Secondary | ICD-10-CM | POA: Diagnosis not present

## 2018-11-14 DIAGNOSIS — D509 Iron deficiency anemia, unspecified: Secondary | ICD-10-CM | POA: Diagnosis not present

## 2018-11-14 DIAGNOSIS — E1122 Type 2 diabetes mellitus with diabetic chronic kidney disease: Secondary | ICD-10-CM | POA: Diagnosis not present

## 2018-11-14 DIAGNOSIS — Z9861 Coronary angioplasty status: Secondary | ICD-10-CM | POA: Diagnosis not present

## 2018-11-14 DIAGNOSIS — M419 Scoliosis, unspecified: Secondary | ICD-10-CM | POA: Diagnosis not present

## 2018-11-14 DIAGNOSIS — H5462 Unqualified visual loss, left eye, normal vision right eye: Secondary | ICD-10-CM | POA: Diagnosis not present

## 2018-11-14 DIAGNOSIS — I130 Hypertensive heart and chronic kidney disease with heart failure and stage 1 through stage 4 chronic kidney disease, or unspecified chronic kidney disease: Secondary | ICD-10-CM | POA: Diagnosis not present

## 2018-11-14 DIAGNOSIS — Z794 Long term (current) use of insulin: Secondary | ICD-10-CM | POA: Diagnosis not present

## 2018-11-14 DIAGNOSIS — I080 Rheumatic disorders of both mitral and aortic valves: Secondary | ICD-10-CM | POA: Diagnosis not present

## 2018-11-14 DIAGNOSIS — J45909 Unspecified asthma, uncomplicated: Secondary | ICD-10-CM | POA: Diagnosis not present

## 2018-11-14 DIAGNOSIS — M1712 Unilateral primary osteoarthritis, left knee: Secondary | ICD-10-CM | POA: Diagnosis not present

## 2018-11-14 DIAGNOSIS — Z951 Presence of aortocoronary bypass graft: Secondary | ICD-10-CM | POA: Diagnosis not present

## 2018-11-15 DIAGNOSIS — Z7982 Long term (current) use of aspirin: Secondary | ICD-10-CM | POA: Diagnosis not present

## 2018-11-15 DIAGNOSIS — I5033 Acute on chronic diastolic (congestive) heart failure: Secondary | ICD-10-CM | POA: Diagnosis not present

## 2018-11-15 DIAGNOSIS — Z96651 Presence of right artificial knee joint: Secondary | ICD-10-CM | POA: Diagnosis not present

## 2018-11-15 DIAGNOSIS — I130 Hypertensive heart and chronic kidney disease with heart failure and stage 1 through stage 4 chronic kidney disease, or unspecified chronic kidney disease: Secondary | ICD-10-CM | POA: Diagnosis not present

## 2018-11-15 DIAGNOSIS — M1712 Unilateral primary osteoarthritis, left knee: Secondary | ICD-10-CM | POA: Diagnosis not present

## 2018-11-15 DIAGNOSIS — Z794 Long term (current) use of insulin: Secondary | ICD-10-CM | POA: Diagnosis not present

## 2018-11-15 DIAGNOSIS — N183 Chronic kidney disease, stage 3 (moderate): Secondary | ICD-10-CM | POA: Diagnosis not present

## 2018-11-15 DIAGNOSIS — E785 Hyperlipidemia, unspecified: Secondary | ICD-10-CM | POA: Diagnosis not present

## 2018-11-15 DIAGNOSIS — H9190 Unspecified hearing loss, unspecified ear: Secondary | ICD-10-CM | POA: Diagnosis not present

## 2018-11-15 DIAGNOSIS — D509 Iron deficiency anemia, unspecified: Secondary | ICD-10-CM | POA: Diagnosis not present

## 2018-11-15 DIAGNOSIS — D631 Anemia in chronic kidney disease: Secondary | ICD-10-CM | POA: Diagnosis not present

## 2018-11-15 DIAGNOSIS — I252 Old myocardial infarction: Secondary | ICD-10-CM | POA: Diagnosis not present

## 2018-11-15 DIAGNOSIS — H5462 Unqualified visual loss, left eye, normal vision right eye: Secondary | ICD-10-CM | POA: Diagnosis not present

## 2018-11-15 DIAGNOSIS — J45909 Unspecified asthma, uncomplicated: Secondary | ICD-10-CM | POA: Diagnosis not present

## 2018-11-15 DIAGNOSIS — M160 Bilateral primary osteoarthritis of hip: Secondary | ICD-10-CM | POA: Diagnosis not present

## 2018-11-15 DIAGNOSIS — Z9861 Coronary angioplasty status: Secondary | ICD-10-CM | POA: Diagnosis not present

## 2018-11-15 DIAGNOSIS — M419 Scoliosis, unspecified: Secondary | ICD-10-CM | POA: Diagnosis not present

## 2018-11-15 DIAGNOSIS — I2510 Atherosclerotic heart disease of native coronary artery without angina pectoris: Secondary | ICD-10-CM | POA: Diagnosis not present

## 2018-11-15 DIAGNOSIS — Z7902 Long term (current) use of antithrombotics/antiplatelets: Secondary | ICD-10-CM | POA: Diagnosis not present

## 2018-11-15 DIAGNOSIS — E1122 Type 2 diabetes mellitus with diabetic chronic kidney disease: Secondary | ICD-10-CM | POA: Diagnosis not present

## 2018-11-15 DIAGNOSIS — I080 Rheumatic disorders of both mitral and aortic valves: Secondary | ICD-10-CM | POA: Diagnosis not present

## 2018-11-15 DIAGNOSIS — Z951 Presence of aortocoronary bypass graft: Secondary | ICD-10-CM | POA: Diagnosis not present

## 2018-11-16 ENCOUNTER — Other Ambulatory Visit: Payer: Self-pay | Admitting: Family Medicine

## 2018-11-16 ENCOUNTER — Encounter: Payer: Self-pay | Admitting: Family Medicine

## 2018-11-16 ENCOUNTER — Ambulatory Visit (INDEPENDENT_AMBULATORY_CARE_PROVIDER_SITE_OTHER): Payer: Medicare Other

## 2018-11-16 ENCOUNTER — Ambulatory Visit (INDEPENDENT_AMBULATORY_CARE_PROVIDER_SITE_OTHER): Payer: Medicare Other | Admitting: Family Medicine

## 2018-11-16 VITALS — BP 178/78 | HR 69 | Temp 96.7°F | Ht 66.0 in | Wt 151.0 lb

## 2018-11-16 DIAGNOSIS — R05 Cough: Secondary | ICD-10-CM | POA: Diagnosis not present

## 2018-11-16 DIAGNOSIS — R0601 Orthopnea: Secondary | ICD-10-CM | POA: Diagnosis not present

## 2018-11-16 DIAGNOSIS — R0989 Other specified symptoms and signs involving the circulatory and respiratory systems: Secondary | ICD-10-CM

## 2018-11-16 DIAGNOSIS — I5021 Acute systolic (congestive) heart failure: Secondary | ICD-10-CM

## 2018-11-16 MED ORDER — LEVOFLOXACIN 500 MG PO TABS: 500.0000 mg | ORAL_TABLET | Freq: Every day | ORAL | 0 refills | Status: DC

## 2018-11-16 NOTE — Progress Notes (Signed)
Subjective:  [name], female    DOB: July 10, 1931  Age: 84 y.o. MRN: [id number]  CC: Cough and Nasal Congestion   HPI [name] presents for onset last night of shortness of breath.  She developed a cough during the night causing her daughter to become concerned.  Patient then admitted to shortness of breath therefore they call today for appointment.  She is not coughing up anything.  She has no fever.  However she is short of breath more at night.  She does not clearly relate this to position.  Patient is not using the Symbicort.  Has noted some edema in the legs.  Daughter gives much of the history.  Both patient and daughter are concerned about fluid on the lungs since it is happened before.  Depression screen [facility] 2/9 11/16/2018 10/07/2018 10/01/2018  Decreased Interest 1 0 0  Down, Depressed, Hopeless 1 0 0  PHQ - 2 Score 2 0 0  Altered sleeping 2 - -  Tired, decreased energy 1 - -  Change in appetite 1 - -  Feeling bad or failure about yourself  1 - -  Trouble concentrating 1 - -  Moving slowly or fidgety/restless 1 - -  Suicidal thoughts 0 - -  PHQ-9 Score 9 - -  Difficult doing work/chores - - -  Some recent data might be hidden    History [name] has a past medical history of Anemia, Anxiety, Aortic stenosis, Arthritis, Asthma, Balance problems, CAD (coronary artery disease), Carotid stenosis, CHF (congestive heart failure) (HCC), CKD (chronic kidney disease), CVA (cerebral infarction), Dementia ([place]), Depression, Diabetes mellitus, GERD (gastroesophageal reflux disease), Hearing loss, Hypertension, Iron deficiency anemia (05/27/2011), Myocardial infarction [facility]), Renal insufficiency, Scoliosis, Shortness of breath dyspnea, Vertigo, and Vision loss.   She has a past surgical history that includes I & D of Furuncle (April 2013); Coronary artery bypass graft (2006); Coronary angioplasty; Total abdominal hysterectomy (~84 years old); Cholecystectomy;  Appendectomy; Cataract extraction (Bilateral, 5 years ago); Total knee arthroplasty (Right, 08/13/2015); Knee surgery (Right); and Colonoscopy (2005).   Her family history includes Cancer in her brother; Diabetes in her sister and sister; Early death in her sister and son; Heart attack in her brother, brother, sister, and son; Heart attack (age of onset: 50) in her daughter; Heart disease in her brother, brother, and brother; Hypertension in her sister; Osteoporosis in her sister; Pancreatitis in her son.She reports that she has never smoked. She has never used smokeless tobacco. She reports that she does not drink alcohol or use drugs.    ROS Review of Systems  Constitutional: Positive for fatigue. Negative for appetite change, chills, diaphoresis and fever.  HENT: Negative.   Eyes: Negative for visual disturbance.  Respiratory: Positive for cough and shortness of breath.   Cardiovascular: Positive for leg swelling. Negative for chest pain and palpitations.  Gastrointestinal: Negative for abdominal pain.  Musculoskeletal: Positive for arthralgias.    Objective:  BP (!) 178/78   Pulse 69   Temp (!) 96.7 F (35.9 C) (Oral)   Ht _0  (1.676 m)   Wt 151 lb (68.5 kg)   BMI 24.37 kg/m   BP Readings from Last 3 Encounters:  11/16/18 (!) 178/78  11/08/18 131/65  10/18/18 132/80    Wt Readings from Last 3 Encounters:  11/16/18 151 lb (68.5 kg)  11/08/18 151 lb 6.4 oz (68.7 kg)  10/18/18 152 lb (68.9 kg)     Physical Exam  Constitutional: She is  oriented to person, place, and time. She appears well-developed and well-nourished. No distress.  HENT:  Head: Normocephalic and atraumatic.  Eyes: Pupils are equal, round, and reactive to light. Conjunctivae are normal.  Neck: Normal range of motion. Neck supple. No thyromegaly present.  Cardiovascular: Normal rate, regular rhythm and normal heart sounds.  No murmur heard. Pulmonary/Chest: Effort normal. No respiratory distress. She has  no wheezes. She has rales (At bases bilaterally).  Abdominal: Soft. She exhibits no distension. There is no tenderness.  Musculoskeletal: Normal range of motion.  Lymphadenopathy:    She has no cervical adenopathy.  Neurological: She is alert and oriented to person, place, and time.  Skin: Skin is warm and dry.  Psychiatric: She has a normal mood and affect. Her behavior is normal. Judgment and thought content normal.   Chest x-ray: Bilateral pleural effusions.   Assessment & Plan:   Darbi was seen today for cough and nasal congestion.  Diagnoses and all orders for this visit:  Acute systolic congestive heart failure ([place]) -     Brain natriuretic peptide -     CMP14+EGFR  Chest congestion -     DG Chest 2 View; Future -     Brain natriuretic peptide -     CMP14+EGFR  Orthopnea -     Brain natriuretic peptide       I am having [name] maintain her Artificial Tear Ointment (DRY EYES OP), Multiple Vitamins-Iron (DAILY VITAMINS/IRON/BETA CAROT PO), aspirin, hydrocortisone, ferrous sulfate, blood glucose meter kit and supplies, FORA LANCETS, glucose blood, insulin lispro, Insulin Pen Needle, Insulin Syringe-Needle U-100, polyethylene glycol, methenamine, albuterol, budesonide-formoterol, PARoxetine, metoprolol tartrate, metFORMIN, meclizine, lisinopril, isosorbide mononitrate, furosemide, clopidogrel, atorvastatin, amLODipine, pantoprazole, lisinopril, esomeprazole, Insulin Detemir, and nitroGLYCERIN.  Allergies as of 11/16/2018      Reactions   Advil [ibuprofen] Swelling   Heparin Other (See Comments)   Confusion, hallucinations.    Propoxyphene N-acetaminophen Other (See Comments)   Numbness all over. "floating" sensation.      Medication List        Accurate as of 11/16/18 11:52 AM. Always use your most recent med list.          albuterol 108 (90 Base) MCG/ACT inhaler Commonly known as:  PROVENTIL HFA;VENTOLIN HFA Inhale 2 puffs into the lungs every 6  (six) hours as needed for wheezing or shortness of breath.   amLODipine 10 MG tablet Commonly known as:  NORVASC Take 1 tablet (10 mg total) by mouth every morning.   aspirin 81 MG chewable tablet Chew 81 mg by mouth at bedtime.   atorvastatin 20 MG tablet Commonly known as:  LIPITOR Take 1 tablet (20 mg total) by mouth at bedtime.   blood glucose meter kit and supplies Kit Dispense based on insurance coverage. Use 4 times a day as directed Dx: E11.9, Z79.4   budesonide-formoterol 80-4.5 MCG/ACT inhaler Commonly known as:  SYMBICORT Inhale 2 puffs into the lungs 2 (two) times daily.   clopidogrel 75 MG tablet Commonly known as:  PLAVIX Take 1 tablet (75 mg total) by mouth every morning.   DAILY VITAMINS/IRON/BETA CAROT PO Take 1 tablet by mouth at bedtime.   DRY EYES OP Apply 1 drop to eye daily as needed (dry/red eyes).   esomeprazole 40 MG capsule Commonly known as:  NEXIUM Take 1 capsule (40 mg total) by mouth daily.   ferrous sulfate 325 (65 FE) MG tablet TAKE 1 TABLET 3 TIMES A DAY AFTER MEALS.   FORA  LANCETS Misc Test blood sugars twice daily   furosemide 20 MG tablet Commonly known as:  LASIX Take 1 tablet (20 mg total) by mouth daily.   glucose blood test strip Use to check blood glucose twice a day   hydrocortisone 25 MG suppository Commonly known as:  ANUSOL-HC Place 1 suppository (25 mg total) rectally 2 (two) times daily.   Insulin Detemir 100 UNIT/ML Pen Commonly known as:  LEVEMIR Inject 24 Units into the skin every morning.   insulin lispro 100 UNIT/ML KiwkPen Commonly known as:  HUMALOG Inject 0.04-0.1 mLs (4-10 Units total) into the skin daily as needed (high blood sugar).   Insulin Pen Needle 31G X 8 MM Misc AS DIRECTED   Insulin Syringe-Needle U-100 31G X 5/16" 1 ML Misc USE AS DIRECTED.   isosorbide mononitrate 30 MG 24 hr tablet Commonly known as:  IMDUR Take 1 tablet (30 mg total) by mouth daily.   lisinopril 20 MG  tablet Commonly known as:  PRINIVIL,ZESTRIL Take 1 tablet (20 mg total) by mouth every morning.   lisinopril 20 MG tablet Commonly known as:  PRINIVIL,ZESTRIL TAKE 1 TABLET BY MOUTH ONCE DAILY.   meclizine 25 MG tablet Commonly known as:  ANTIVERT Take 1 tablet (25 mg total) by mouth 2 (two) times daily.   metFORMIN 500 MG 24 hr tablet Commonly known as:  GLUCOPHAGE-XR Take 1 tablet (500 mg total) by mouth every evening.   methenamine 1 g tablet Commonly known as:  HIPREX Take 1 tablet (1 g total) by mouth 2 (two) times daily.   metoprolol tartrate 50 MG tablet Commonly known as:  LOPRESSOR Take 1 tablet (50 mg total) by mouth 2 (two) times daily.   nitroGLYCERIN 0.4 MG SL tablet Commonly known as:  NITROSTAT Place 1 tablet (0.4 mg total) under the tongue every 5 (five) minutes as needed for chest pain.   pantoprazole 40 MG tablet Commonly known as:  PROTONIX Take 1 tablet (40 mg total) by mouth daily. For stomach   PARoxetine 20 MG tablet Commonly known as:  PAXIL Take 1 tablet (20 mg total) by mouth every morning.   polyethylene glycol packet Commonly known as:  MIRALAX / GLYCOLAX Take 17 g by mouth daily.        Follow-up: Return in about 6 days (around 11/22/2018).  [name], M.D.

## 2018-11-17 DIAGNOSIS — H9190 Unspecified hearing loss, unspecified ear: Secondary | ICD-10-CM | POA: Diagnosis not present

## 2018-11-17 DIAGNOSIS — I080 Rheumatic disorders of both mitral and aortic valves: Secondary | ICD-10-CM | POA: Diagnosis not present

## 2018-11-17 DIAGNOSIS — E785 Hyperlipidemia, unspecified: Secondary | ICD-10-CM | POA: Diagnosis not present

## 2018-11-17 DIAGNOSIS — Z96651 Presence of right artificial knee joint: Secondary | ICD-10-CM | POA: Diagnosis not present

## 2018-11-17 DIAGNOSIS — M160 Bilateral primary osteoarthritis of hip: Secondary | ICD-10-CM | POA: Diagnosis not present

## 2018-11-17 DIAGNOSIS — H5462 Unqualified visual loss, left eye, normal vision right eye: Secondary | ICD-10-CM | POA: Diagnosis not present

## 2018-11-17 DIAGNOSIS — Z7902 Long term (current) use of antithrombotics/antiplatelets: Secondary | ICD-10-CM | POA: Diagnosis not present

## 2018-11-17 DIAGNOSIS — D631 Anemia in chronic kidney disease: Secondary | ICD-10-CM | POA: Diagnosis not present

## 2018-11-17 DIAGNOSIS — D509 Iron deficiency anemia, unspecified: Secondary | ICD-10-CM | POA: Diagnosis not present

## 2018-11-17 DIAGNOSIS — N183 Chronic kidney disease, stage 3 (moderate): Secondary | ICD-10-CM | POA: Diagnosis not present

## 2018-11-17 DIAGNOSIS — Z951 Presence of aortocoronary bypass graft: Secondary | ICD-10-CM | POA: Diagnosis not present

## 2018-11-17 DIAGNOSIS — Z794 Long term (current) use of insulin: Secondary | ICD-10-CM | POA: Diagnosis not present

## 2018-11-17 DIAGNOSIS — I5033 Acute on chronic diastolic (congestive) heart failure: Secondary | ICD-10-CM | POA: Diagnosis not present

## 2018-11-17 DIAGNOSIS — J45909 Unspecified asthma, uncomplicated: Secondary | ICD-10-CM | POA: Diagnosis not present

## 2018-11-17 DIAGNOSIS — I2510 Atherosclerotic heart disease of native coronary artery without angina pectoris: Secondary | ICD-10-CM | POA: Diagnosis not present

## 2018-11-17 DIAGNOSIS — I130 Hypertensive heart and chronic kidney disease with heart failure and stage 1 through stage 4 chronic kidney disease, or unspecified chronic kidney disease: Secondary | ICD-10-CM | POA: Diagnosis not present

## 2018-11-17 DIAGNOSIS — I252 Old myocardial infarction: Secondary | ICD-10-CM | POA: Diagnosis not present

## 2018-11-17 DIAGNOSIS — E1122 Type 2 diabetes mellitus with diabetic chronic kidney disease: Secondary | ICD-10-CM | POA: Diagnosis not present

## 2018-11-17 DIAGNOSIS — M1712 Unilateral primary osteoarthritis, left knee: Secondary | ICD-10-CM | POA: Diagnosis not present

## 2018-11-17 DIAGNOSIS — Z9861 Coronary angioplasty status: Secondary | ICD-10-CM | POA: Diagnosis not present

## 2018-11-17 DIAGNOSIS — Z7982 Long term (current) use of aspirin: Secondary | ICD-10-CM | POA: Diagnosis not present

## 2018-11-17 DIAGNOSIS — M419 Scoliosis, unspecified: Secondary | ICD-10-CM | POA: Diagnosis not present

## 2018-11-17 LAB — CMP14+EGFR
ALT: 10 IU/L (ref 0–32)
AST: 8 IU/L (ref 0–40)
Albumin/Globulin Ratio: 1.7 (ref 1.2–2.2)
Albumin: 4.1 g/dL (ref 3.5–4.7)
Alkaline Phosphatase: 46 IU/L (ref 39–117)
BUN/Creatinine Ratio: 25 (ref 12–28)
BUN: 33 mg/dL — AB (ref 8–27)
Bilirubin Total: 0.3 mg/dL (ref 0.0–1.2)
CALCIUM: 9.7 mg/dL (ref 8.7–10.3)
CO2: 23 mmol/L (ref 20–29)
CREATININE: 1.33 mg/dL — AB (ref 0.57–1.00)
Chloride: 102 mmol/L (ref 96–106)
GFR calc Af Amer: 42 mL/min/{1.73_m2} — ABNORMAL LOW (ref >59)
GFR, EST NON AFRICAN AMERICAN: 36 mL/min/{1.73_m2} — AB (ref >59)
GLUCOSE: 283 mg/dL — AB (ref 65–99)
Globulin, Total: 2.4 g/dL (ref 1.5–4.5)
Potassium: 4.5 mmol/L (ref 3.5–5.2)
SODIUM: 142 mmol/L (ref 134–144)
Total Protein: 6.5 g/dL (ref 6.0–8.5)

## 2018-11-17 LAB — BRAIN NATRIURETIC PEPTIDE: BNP: 403.2 pg/mL — AB (ref 0.0–100.0)

## 2018-11-21 DIAGNOSIS — Z9861 Coronary angioplasty status: Secondary | ICD-10-CM | POA: Diagnosis not present

## 2018-11-21 DIAGNOSIS — I2510 Atherosclerotic heart disease of native coronary artery without angina pectoris: Secondary | ICD-10-CM | POA: Diagnosis not present

## 2018-11-21 DIAGNOSIS — I130 Hypertensive heart and chronic kidney disease with heart failure and stage 1 through stage 4 chronic kidney disease, or unspecified chronic kidney disease: Secondary | ICD-10-CM | POA: Diagnosis not present

## 2018-11-21 DIAGNOSIS — I252 Old myocardial infarction: Secondary | ICD-10-CM | POA: Diagnosis not present

## 2018-11-21 DIAGNOSIS — M1712 Unilateral primary osteoarthritis, left knee: Secondary | ICD-10-CM | POA: Diagnosis not present

## 2018-11-21 DIAGNOSIS — H9190 Unspecified hearing loss, unspecified ear: Secondary | ICD-10-CM | POA: Diagnosis not present

## 2018-11-21 DIAGNOSIS — J45909 Unspecified asthma, uncomplicated: Secondary | ICD-10-CM | POA: Diagnosis not present

## 2018-11-21 DIAGNOSIS — I5033 Acute on chronic diastolic (congestive) heart failure: Secondary | ICD-10-CM | POA: Diagnosis not present

## 2018-11-21 DIAGNOSIS — E785 Hyperlipidemia, unspecified: Secondary | ICD-10-CM | POA: Diagnosis not present

## 2018-11-21 DIAGNOSIS — I080 Rheumatic disorders of both mitral and aortic valves: Secondary | ICD-10-CM | POA: Diagnosis not present

## 2018-11-21 DIAGNOSIS — Z7982 Long term (current) use of aspirin: Secondary | ICD-10-CM | POA: Diagnosis not present

## 2018-11-21 DIAGNOSIS — Z794 Long term (current) use of insulin: Secondary | ICD-10-CM | POA: Diagnosis not present

## 2018-11-21 DIAGNOSIS — Z96651 Presence of right artificial knee joint: Secondary | ICD-10-CM | POA: Diagnosis not present

## 2018-11-21 DIAGNOSIS — D509 Iron deficiency anemia, unspecified: Secondary | ICD-10-CM | POA: Diagnosis not present

## 2018-11-21 DIAGNOSIS — E1122 Type 2 diabetes mellitus with diabetic chronic kidney disease: Secondary | ICD-10-CM | POA: Diagnosis not present

## 2018-11-21 DIAGNOSIS — Z7902 Long term (current) use of antithrombotics/antiplatelets: Secondary | ICD-10-CM | POA: Diagnosis not present

## 2018-11-21 DIAGNOSIS — M160 Bilateral primary osteoarthritis of hip: Secondary | ICD-10-CM | POA: Diagnosis not present

## 2018-11-21 DIAGNOSIS — D631 Anemia in chronic kidney disease: Secondary | ICD-10-CM | POA: Diagnosis not present

## 2018-11-21 DIAGNOSIS — H5462 Unqualified visual loss, left eye, normal vision right eye: Secondary | ICD-10-CM | POA: Diagnosis not present

## 2018-11-21 DIAGNOSIS — N183 Chronic kidney disease, stage 3 (moderate): Secondary | ICD-10-CM | POA: Diagnosis not present

## 2018-11-21 DIAGNOSIS — M419 Scoliosis, unspecified: Secondary | ICD-10-CM | POA: Diagnosis not present

## 2018-11-21 DIAGNOSIS — Z951 Presence of aortocoronary bypass graft: Secondary | ICD-10-CM | POA: Diagnosis not present

## 2018-11-22 DIAGNOSIS — E1122 Type 2 diabetes mellitus with diabetic chronic kidney disease: Secondary | ICD-10-CM | POA: Diagnosis not present

## 2018-11-22 DIAGNOSIS — I5033 Acute on chronic diastolic (congestive) heart failure: Secondary | ICD-10-CM | POA: Diagnosis not present

## 2018-11-22 DIAGNOSIS — M1712 Unilateral primary osteoarthritis, left knee: Secondary | ICD-10-CM | POA: Diagnosis not present

## 2018-11-22 DIAGNOSIS — Z7902 Long term (current) use of antithrombotics/antiplatelets: Secondary | ICD-10-CM | POA: Diagnosis not present

## 2018-11-22 DIAGNOSIS — H9190 Unspecified hearing loss, unspecified ear: Secondary | ICD-10-CM | POA: Diagnosis not present

## 2018-11-22 DIAGNOSIS — Z794 Long term (current) use of insulin: Secondary | ICD-10-CM | POA: Diagnosis not present

## 2018-11-22 DIAGNOSIS — E785 Hyperlipidemia, unspecified: Secondary | ICD-10-CM | POA: Diagnosis not present

## 2018-11-22 DIAGNOSIS — Z7982 Long term (current) use of aspirin: Secondary | ICD-10-CM | POA: Diagnosis not present

## 2018-11-22 DIAGNOSIS — M160 Bilateral primary osteoarthritis of hip: Secondary | ICD-10-CM | POA: Diagnosis not present

## 2018-11-22 DIAGNOSIS — I080 Rheumatic disorders of both mitral and aortic valves: Secondary | ICD-10-CM | POA: Diagnosis not present

## 2018-11-22 DIAGNOSIS — D509 Iron deficiency anemia, unspecified: Secondary | ICD-10-CM | POA: Diagnosis not present

## 2018-11-22 DIAGNOSIS — I252 Old myocardial infarction: Secondary | ICD-10-CM | POA: Diagnosis not present

## 2018-11-22 DIAGNOSIS — J45909 Unspecified asthma, uncomplicated: Secondary | ICD-10-CM | POA: Diagnosis not present

## 2018-11-22 DIAGNOSIS — D631 Anemia in chronic kidney disease: Secondary | ICD-10-CM | POA: Diagnosis not present

## 2018-11-22 DIAGNOSIS — I2510 Atherosclerotic heart disease of native coronary artery without angina pectoris: Secondary | ICD-10-CM | POA: Diagnosis not present

## 2018-11-22 DIAGNOSIS — Z96651 Presence of right artificial knee joint: Secondary | ICD-10-CM | POA: Diagnosis not present

## 2018-11-22 DIAGNOSIS — Z9861 Coronary angioplasty status: Secondary | ICD-10-CM | POA: Diagnosis not present

## 2018-11-22 DIAGNOSIS — M419 Scoliosis, unspecified: Secondary | ICD-10-CM | POA: Diagnosis not present

## 2018-11-22 DIAGNOSIS — N183 Chronic kidney disease, stage 3 (moderate): Secondary | ICD-10-CM | POA: Diagnosis not present

## 2018-11-22 DIAGNOSIS — Z951 Presence of aortocoronary bypass graft: Secondary | ICD-10-CM | POA: Diagnosis not present

## 2018-11-22 DIAGNOSIS — H5462 Unqualified visual loss, left eye, normal vision right eye: Secondary | ICD-10-CM | POA: Diagnosis not present

## 2018-11-22 DIAGNOSIS — I130 Hypertensive heart and chronic kidney disease with heart failure and stage 1 through stage 4 chronic kidney disease, or unspecified chronic kidney disease: Secondary | ICD-10-CM | POA: Diagnosis not present

## 2018-11-22 NOTE — Progress Notes (Signed)
HPI The patient presents for one-year followup of CAD.  I saw her in Dec 2017.  Since I last saw her she was in the hospital.  I reviewed these records for this visit.  She had acute on chronic diastolic heart failure.  She had 6 L removed with IV Lasix and about 60 hours.  It looks like her discharge weight was 148 which is where she is now.  She is had some continued lower extremity swelling.  She saw Dr. Berneice Heinrich today and he added spironolactone 50 mg and increased her Lasix from 20 to 60 mg daily.  She is not been having any acute shortness of breath by her report today.  She is getting weights but I do not know that these are daily.  She has 2 nurses and a therapist that are coming to the house.  She feels better with breathing with Symbicort.  She is not describing PND or orthopnea.  Is not having any chest pressure, neck or arm discomfort.  She is not having any palpitations, presyncope or syncope.    Allergies  Allergen Reactions  . Advil [Ibuprofen] Swelling  . Heparin Other (See Comments)    Confusion, hallucinations.   . Propoxyphene N-Acetaminophen Other (See Comments)    Numbness all over. "floating" sensation.    Current Outpatient Medications  Medication Sig Dispense Refill  . albuterol (PROVENTIL HFA;VENTOLIN HFA) 108 (90 Base) MCG/ACT inhaler Inhale 2 puffs into the lungs every 6 (six) hours as needed for wheezing or shortness of breath. 1 Inhaler 0  . amLODipine (NORVASC) 10 MG tablet Take 1 tablet (10 mg total) by mouth every morning. 90 tablet 2  . Artificial Tear Ointment (DRY EYES OP) Apply 1 drop to eye daily as needed (dry/red eyes).     Marland Kitchen aspirin 81 MG chewable tablet Chew 81 mg by mouth at bedtime.    Marland Kitchen atorvastatin (LIPITOR) 20 MG tablet Take 1 tablet (20 mg total) by mouth at bedtime. 90 tablet 3  . budesonide-formoterol (SYMBICORT) 80-4.5 MCG/ACT inhaler Inhale 2 puffs into the lungs 2 (two) times daily. (Patient taking differently: Inhale 2 puffs into the lungs  as needed. ) 1 Inhaler 3  . clopidogrel (PLAVIX) 75 MG tablet Take 1 tablet (75 mg total) by mouth every morning. 90 tablet 2  . esomeprazole (NEXIUM) 40 MG capsule Take 1 capsule (40 mg total) by mouth daily. 30 capsule 5  . ferrous sulfate 324 (65 Fe) MG TBEC Take by mouth daily.    . furosemide (LASIX) 20 MG tablet Take 20 mg by mouth. Take two tablets by mouth in the morning    . hydrocortisone (ANUSOL-HC) 25 MG suppository Place 1 suppository (25 mg total) rectally 2 (two) times daily. (Patient taking differently: Place 25 mg rectally 2 (two) times daily as needed for hemorrhoids or anal itching. ) 12 suppository 0  . Insulin Detemir (LEVEMIR FLEXTOUCH) 100 UNIT/ML Pen Inject 24 Units into the skin every morning. 15 mL 0  . insulin lispro (HUMALOG KWIKPEN) 100 UNIT/ML KiwkPen Inject 0.04-0.1 mLs (4-10 Units total) into the skin daily as needed (high blood sugar). 15 mL 5  . Insulin Pen Needle (GLOBAL EASE INJECT PEN NEEDLES) 31G X 8 MM MISC AS DIRECTED 100 each 2  . isosorbide mononitrate (IMDUR) 30 MG 24 hr tablet Take 1 tablet (30 mg total) by mouth daily. 90 tablet 3  . levofloxacin (LEVAQUIN) 500 MG tablet Take 1 tablet (500 mg total) by mouth daily. 7 tablet  0  . lisinopril (PRINIVIL,ZESTRIL) 20 MG tablet Take 1 tablet (20 mg total) by mouth every morning. 90 tablet 2  . meclizine (ANTIVERT) 25 MG tablet Take 1 tablet (25 mg total) by mouth 2 (two) times daily. 180 tablet 2  . metFORMIN (GLUCOPHAGE-XR) 500 MG 24 hr tablet Take 1 tablet (500 mg total) by mouth every evening. 90 tablet 2  . methenamine (HIPREX) 1 g tablet Take 1 tablet (1 g total) by mouth 2 (two) times daily. 60 tablet 5  . metoprolol tartrate (LOPRESSOR) 50 MG tablet Take 1 tablet (50 mg total) by mouth 2 (two) times daily. 180 tablet 2  . Multiple Vitamins-Iron (DAILY VITAMINS/IRON/BETA CAROT PO) Take 1 tablet by mouth at bedtime.    . nitroGLYCERIN (NITROSTAT) 0.4 MG SL tablet Place 1 tablet (0.4 mg total) under the  tongue every 5 (five) minutes as needed for chest pain. 25 tablet 0  . PARoxetine (PAXIL) 20 MG tablet Take 1 tablet (20 mg total) by mouth every morning. 90 tablet 1  . polyethylene glycol (MIRALAX) packet Take 17 g by mouth daily. (Patient taking differently: Take 17 g by mouth daily as needed for mild constipation or moderate constipation. ) 30 each 0  . blood glucose meter kit and supplies KIT Dispense based on insurance coverage. Use 4 times a day as directed Dx: E11.9, Z79.4 1 each 0  . ferrous sulfate 325 (65 FE) MG tablet TAKE 1 TABLET 3 TIMES A DAY AFTER MEALS. (Patient taking differently: Take 325 mg by mouth daily at 12 noon. ) 90 tablet 4  . FORA LANCETS MISC Test blood sugars twice daily 200 each 3  . glucose blood (ACCU-CHEK AVIVA) test strip Use to check blood glucose twice a day 200 each 3  . Insulin Syringe-Needle U-100 (EASY TOUCH INSULIN SYRINGE) 31G X 5/16" 1 ML MISC USE AS DIRECTED. 80 each 2  . spironolactone (ALDACTONE) 25 MG tablet Take 2 every AM 30 tablet 0   No current facility-administered medications for this visit.     Past Medical History:  Diagnosis Date  . Anemia   . Anxiety   . Aortic stenosis   . Arthritis    back, knees, and hips  . Asthma    with allergies  . Balance problems   . CAD (coronary artery disease)   . Carotid stenosis   . CHF (congestive heart failure) ([place])    EF preserved Echo 2012  . CKD (chronic kidney disease)   . 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 Dementia ([place])    "a little"  . Depression   . Diabetes mellitus    type 2  . GERD (gastroesophageal reflux disease)   . Hearing loss   . Hypertension   . Iron deficiency anemia 05/27/2011  . Myocardial infarction ([place])   . Renal insufficiency   . Scoliosis   . Shortness of breath dyspnea    with activity  . Vertigo    "when sugar gets low"  . Vision loss    left eye-"half blind"     Past Surgical History:  Procedure Laterality Date  . APPENDECTOMY     with hysterectomy  . CATARACT EXTRACTION Bilateral 5 years ago  . CHOLECYSTECTOMY    . COLONOSCOPY  2005   Dr. Amedeo Plenty: Diverticulosis  . CORONARY ANGIOPLASTY     prior to 2006 5 stents  . CORONARY ARTERY BYPASS GRAFT  2006  . I & D of [place]  April 2013  . KNEE SURGERY Right   . TOTAL ABDOMINAL HYSTERECTOMY  ~84 years old   complete, with tumor removal  . TOTAL KNEE ARTHROPLASTY Right 08/13/2015   Procedure: RIGHT  TOTAL KNEE ARTHROPLASTY;  Surgeon: Paralee Cancel, MD;  Location: [facility];  Service: Orthopedics;  Laterality: Right;    ROS:  Positive for back pain. Otherwise as stated in the HPI and negative for all other systems.   PHYSICAL EXAM BP (!) 118/52   Pulse 60   Ht 5' 6"  (1.676 m)   Wt 147 lb (66.7 kg)   BMI 23.73 kg/m   GENERAL:  Well appearing NECK:  No jugular venous distention, waveform within normal limits, carotid upstroke brisk and symmetric, no bruits, no thyromegaly LUNGS:  Clear to auscultation bilaterally CHEST:  Unremarkable HEART:  PMI not displaced or sustained,S1 and S2 within normal limits, no S3, no S4, no clicks, no rubs, 3 out of 6 apical systolic murmur radiating at the aortic outflow tract, no diastolic murmurs ABD:  Flat, positive bowel sounds normal in frequency in pitch, no bruits, no rebound, no guarding, no midline pulsatile mass, no hepatomegaly, no splenomegaly EXT:  2 plus pulses throughout, mild ankle edema, no cyanosis no clubbing     EKG:  NA  ASSESSMENT AND PLAN  ACUTE ON CHRONIC DIASTOLIC HF: The patient had some mild ankle edema.  She has had her Lasix increased to 60 mg daily and spironolactone 50 mg daily.  I called to clarify these orders with Dr. Artemio Aly office.  The patient is aware.  She is reduced her salt.  She will need very close follow-up of her renal function as she is had renal insufficiency in the past.  I will defer these med changes to Dr.  Quinn Axe.  CAD:   The patient has no new sypmtoms.  No further cardiovascular testing is indicated.  We will continue with aggressive risk reduction and meds as listed.  CAROTID STENOSIS:  This was mild in 2017.  No further imaging at this point.   HTN: Her blood pressure is controlled.  Meds are managed as above.   DM:  Her A1C is down to 8.1.  This is managed per [name], MD  DYSLIPIDEMIA:  LDL was 42.  She will continue meds as listed.   CKD:  Creat was 1.33   This will be followed closely by [name], MD with the med changes suggested above.

## 2018-11-23 ENCOUNTER — Encounter: Payer: Self-pay | Admitting: Family Medicine

## 2018-11-23 ENCOUNTER — Ambulatory Visit (INDEPENDENT_AMBULATORY_CARE_PROVIDER_SITE_OTHER): Payer: Medicare Other | Admitting: Cardiology

## 2018-11-23 ENCOUNTER — Encounter: Payer: Self-pay | Admitting: Cardiology

## 2018-11-23 ENCOUNTER — Telehealth: Payer: Self-pay | Admitting: Gastroenterology

## 2018-11-23 ENCOUNTER — Ambulatory Visit (INDEPENDENT_AMBULATORY_CARE_PROVIDER_SITE_OTHER): Payer: Medicare Other | Admitting: Family Medicine

## 2018-11-23 VITALS — BP 118/52 | HR 60 | Ht 66.0 in | Wt 147.0 lb

## 2018-11-23 VITALS — BP 145/62 | HR 59 | Temp 96.9°F | Ht 66.0 in | Wt 148.0 lb

## 2018-11-23 DIAGNOSIS — I5033 Acute on chronic diastolic (congestive) heart failure: Secondary | ICD-10-CM | POA: Diagnosis not present

## 2018-11-23 DIAGNOSIS — D649 Anemia, unspecified: Secondary | ICD-10-CM

## 2018-11-23 DIAGNOSIS — I10 Essential (primary) hypertension: Secondary | ICD-10-CM | POA: Diagnosis not present

## 2018-11-23 DIAGNOSIS — E785 Hyperlipidemia, unspecified: Secondary | ICD-10-CM | POA: Diagnosis not present

## 2018-11-23 DIAGNOSIS — I2510 Atherosclerotic heart disease of native coronary artery without angina pectoris: Secondary | ICD-10-CM

## 2018-11-23 MED ORDER — SPIRONOLACTONE 25 MG PO TABS: ORAL_TABLET | ORAL | 0 refills | Status: DC

## 2018-11-23 MED ORDER — FUROSEMIDE 20 MG PO TABS: ORAL_TABLET | ORAL | 11 refills | Status: DC

## 2018-11-23 NOTE — Telephone Encounter (Signed)
LMOM to call.

## 2018-11-23 NOTE — Telephone Encounter (Signed)
Please let patient know that Dr. Oneida Alar reviewed everything and she recommends colonoscopy WITH MAC for rectal bleeding and abnormal colon on CT.   If agreeable,  Schedule TCS with MAC with [facility].  Hold iron 7 days.  Day before TCS, Levemir 12 units, metformin 241m,  AM of TCS: hold Levemir and metformin

## 2018-11-23 NOTE — Patient Instructions (Signed)
Medication Instructions:  Please take medications as listed.  If you need a refill on your cardiac medications before your next appointment, please call your pharmacy.   Have lab work as instructed by Dr Livia Snellen.  Follow-Up: Follow up with Dr Percival Spanish in 3 months in [place].  Thank you for choosing [facility]!!

## 2018-11-23 NOTE — Progress Notes (Signed)
Subjective:  [name], female    DOB: [birth year]  Age: 84 y.o. MRN: [id number]  CC: No chief complaint on file.   HPI [name] presents for follow-up on the swelling and shortness of breath from last week.  She reports improvement.  She continues to have some swelling but there is very minimal shortness of breath.  She continues to take the furosemide 2 pills a day.  She is ambulatory.  She was keeping her feet up to avoid the swelling until she felt so much better last night she was up and around more and the swelling came back.  History Evolett has a past medical history of Anemia, Anxiety, Aortic stenosis, Arthritis, Asthma, Balance problems, CAD (coronary artery disease), Carotid stenosis, CHF (congestive heart failure) ([place]), CKD (chronic kidney disease), CVA (cerebral infarction), Dementia ([place]), Depression, Diabetes mellitus, GERD (gastroesophageal reflux disease), Hearing loss, Hypertension, Iron deficiency anemia (05/27/2011), Myocardial infarction [facility]), Renal insufficiency, Scoliosis, Shortness of breath dyspnea, Vertigo, and Vision loss.   She has a past surgical history that includes I & D of [place] (April 2013); Coronary artery bypass graft (2006); Coronary angioplasty; Total abdominal hysterectomy (~84 years old); Cholecystectomy; Appendectomy; Cataract extraction (Bilateral, 5 years ago); Total knee arthroplasty (Right, 08/13/2015); Knee surgery (Right); and Colonoscopy (2005).   Her family history includes Cancer in her brother; Diabetes in her sister and sister; Early death in her sister and son; Heart attack in her brother, brother, sister, and son; Heart attack (age of onset: 53) in her daughter; Heart disease in her brother, brother, and brother; Hypertension in her sister; Osteoporosis in her sister; Pancreatitis in her son.She reports that she has never smoked. She has never used smokeless tobacco. She reports that she does not drink alcohol or use  drugs.    ROS Review of Systems  Constitutional: Negative.   HENT: Negative.   Eyes: Negative for visual disturbance.  Respiratory: Negative for shortness of breath.   Cardiovascular: Positive for leg swelling. Negative for chest pain.  Gastrointestinal: Negative for abdominal pain.  Musculoskeletal: Negative for arthralgias.    Objective:  BP (!) 145/62   Pulse (!) 59   Temp (!) 96.9 F (36.1 C) (Oral)   Ht 5' 6"  (1.676 m)   Wt 148 lb (67.1 kg)   BMI 23.89 kg/m   BP Readings from Last 3 Encounters:  11/23/18 (!) 145/62  11/16/18 (!) 178/78  11/08/18 131/65    Wt Readings from Last 3 Encounters:  11/23/18 148 lb (67.1 kg)  11/16/18 151 lb (68.5 kg)  11/08/18 151 lb 6.4 oz (68.7 kg)     Physical Exam Constitutional:      General: She is not in acute distress.    Appearance: She is well-developed. She is not diaphoretic.  Neck:     Musculoskeletal: Normal range of motion.  Cardiovascular:     Rate and Rhythm: Normal rate and regular rhythm.  Pulmonary:     Breath sounds: Normal breath sounds.  Abdominal:     Palpations: Abdomen is soft.     Tenderness: There is no abdominal tenderness.  Musculoskeletal:     Right lower leg: Edema present.     Left lower leg: Edema (2+ bilateral) present.  Skin:    General: Skin is warm and dry.  Neurological:     Mental Status: She is alert and oriented to person, place, and time.  Psychiatric:        Mood and Affect: Mood normal.  Behavior: Behavior normal.       Assessment & Plan:   Diagnoses and all orders for this visit:  Acute on chronic diastolic congestive heart failure (HCC) -     CMP14+EGFR -     Brain natriuretic peptide  Other orders -     furosemide (LASIX) 20 MG tablet; Take 3 tablets every morning -     spironolactone (ALDACTONE) 25 MG tablet; Take 2 every AM       I have changed [name]'s furosemide. I am also having her start on spironolactone. Additionally, I am having  her maintain her Artificial Tear Ointment (DRY EYES OP), Multiple Vitamins-Iron (DAILY VITAMINS/IRON/BETA CAROT PO), aspirin, hydrocortisone, ferrous sulfate, blood glucose meter kit and supplies, FORA LANCETS, glucose blood, insulin lispro, Insulin Pen Needle, Insulin Syringe-Needle U-100, polyethylene glycol, methenamine, albuterol, budesonide-formoterol, PARoxetine, metoprolol tartrate, metFORMIN, meclizine, lisinopril, isosorbide mononitrate, clopidogrel, atorvastatin, amLODipine, pantoprazole, lisinopril, esomeprazole, Insulin Detemir, nitroGLYCERIN, and levofloxacin.  Allergies as of 11/23/2018      Reactions   Advil [ibuprofen] Swelling   Heparin Other (See Comments)   Confusion, hallucinations.    Propoxyphene N-acetaminophen Other (See Comments)   Numbness all over. "floating" sensation.      Medication List       Accurate as of November 23, 2018 12:30 PM. Always use your most recent med list.        albuterol 108 (90 Base) MCG/ACT inhaler Commonly known as:  PROVENTIL HFA;VENTOLIN HFA Inhale 2 puffs into the lungs every 6 (six) hours as needed for wheezing or shortness of breath.   amLODipine 10 MG tablet Commonly known as:  NORVASC Take 1 tablet (10 mg total) by mouth every morning.   aspirin 81 MG chewable tablet Chew 81 mg by mouth at bedtime.   atorvastatin 20 MG tablet Commonly known as:  LIPITOR Take 1 tablet (20 mg total) by mouth at bedtime.   blood glucose meter kit and supplies Kit Dispense based on insurance coverage. Use 4 times a day as directed Dx: E11.9, Z79.4   budesonide-formoterol 80-4.5 MCG/ACT inhaler Commonly known as:  SYMBICORT Inhale 2 puffs into the lungs 2 (two) times daily.   clopidogrel 75 MG tablet Commonly known as:  PLAVIX Take 1 tablet (75 mg total) by mouth every morning.   DAILY VITAMINS/IRON/BETA CAROT PO Take 1 tablet by mouth at bedtime.   DRY EYES OP Apply 1 drop to eye daily as needed (dry/red eyes).   esomeprazole 40  MG capsule Commonly known as:  NEXIUM Take 1 capsule (40 mg total) by mouth daily.   ferrous sulfate 325 (65 FE) MG tablet TAKE 1 TABLET 3 TIMES A DAY AFTER MEALS.   FORA LANCETS Misc Test blood sugars twice daily   furosemide 20 MG tablet Commonly known as:  LASIX Take 3 tablets every morning   glucose blood test strip Commonly known as:  ACCU-CHEK AVIVA Use to check blood glucose twice a day   hydrocortisone 25 MG suppository Commonly known as:  ANUSOL-HC Place 1 suppository (25 mg total) rectally 2 (two) times daily.   Insulin Detemir 100 UNIT/ML Pen Commonly known as:  LEVEMIR FLEXTOUCH Inject 24 Units into the skin every morning.   insulin lispro 100 UNIT/ML KiwkPen Commonly known as:  HUMALOG KWIKPEN Inject 0.04-0.1 mLs (4-10 Units total) into the skin daily as needed (high blood sugar).   Insulin Pen Needle 31G X 8 MM Misc Commonly known as:  GLOBAL EASE INJECT PEN NEEDLES AS DIRECTED   Insulin  Syringe-Needle U-100 31G X 5/16" 1 ML Misc Commonly known as:  EASY TOUCH INSULIN SYRINGE USE AS DIRECTED.   isosorbide mononitrate 30 MG 24 hr tablet Commonly known as:  IMDUR Take 1 tablet (30 mg total) by mouth daily.   levofloxacin 500 MG tablet Commonly known as:  LEVAQUIN Take 1 tablet (500 mg total) by mouth daily.   lisinopril 20 MG tablet Commonly known as:  PRINIVIL,ZESTRIL Take 1 tablet (20 mg total) by mouth every morning.   lisinopril 20 MG tablet Commonly known as:  PRINIVIL,ZESTRIL TAKE 1 TABLET BY MOUTH ONCE DAILY.   meclizine 25 MG tablet Commonly known as:  ANTIVERT Take 1 tablet (25 mg total) by mouth 2 (two) times daily.   metFORMIN 500 MG 24 hr tablet Commonly known as:  GLUCOPHAGE-XR Take 1 tablet (500 mg total) by mouth every evening.   methenamine 1 g tablet Commonly known as:  HIPREX Take 1 tablet (1 g total) by mouth 2 (two) times daily.   metoprolol tartrate 50 MG tablet Commonly known as:  LOPRESSOR Take 1 tablet (50 mg  total) by mouth 2 (two) times daily.   nitroGLYCERIN 0.4 MG SL tablet Commonly known as:  NITROSTAT Place 1 tablet (0.4 mg total) under the tongue every 5 (five) minutes as needed for chest pain.   pantoprazole 40 MG tablet Commonly known as:  PROTONIX Take 1 tablet (40 mg total) by mouth daily. For stomach   PARoxetine 20 MG tablet Commonly known as:  PAXIL Take 1 tablet (20 mg total) by mouth every morning.   polyethylene glycol packet Commonly known as:  MIRALAX Take 17 g by mouth daily.   spironolactone 25 MG tablet Commonly known as:  ALDACTONE Take 2 every AM        Follow-up: Return in about 3 weeks (around 12/14/2018), or if symptoms worsen or fail to improve.  [name], M.D.

## 2018-11-24 ENCOUNTER — Other Ambulatory Visit: Payer: Self-pay

## 2018-11-24 DIAGNOSIS — Z9861 Coronary angioplasty status: Secondary | ICD-10-CM | POA: Diagnosis not present

## 2018-11-24 DIAGNOSIS — N183 Chronic kidney disease, stage 3 (moderate): Secondary | ICD-10-CM | POA: Diagnosis not present

## 2018-11-24 DIAGNOSIS — H9190 Unspecified hearing loss, unspecified ear: Secondary | ICD-10-CM | POA: Diagnosis not present

## 2018-11-24 DIAGNOSIS — M419 Scoliosis, unspecified: Secondary | ICD-10-CM | POA: Diagnosis not present

## 2018-11-24 DIAGNOSIS — Z96651 Presence of right artificial knee joint: Secondary | ICD-10-CM | POA: Diagnosis not present

## 2018-11-24 DIAGNOSIS — J45909 Unspecified asthma, uncomplicated: Secondary | ICD-10-CM | POA: Diagnosis not present

## 2018-11-24 DIAGNOSIS — H5462 Unqualified visual loss, left eye, normal vision right eye: Secondary | ICD-10-CM | POA: Diagnosis not present

## 2018-11-24 DIAGNOSIS — I5033 Acute on chronic diastolic (congestive) heart failure: Secondary | ICD-10-CM | POA: Diagnosis not present

## 2018-11-24 DIAGNOSIS — E785 Hyperlipidemia, unspecified: Secondary | ICD-10-CM | POA: Diagnosis not present

## 2018-11-24 DIAGNOSIS — I2510 Atherosclerotic heart disease of native coronary artery without angina pectoris: Secondary | ICD-10-CM | POA: Diagnosis not present

## 2018-11-24 DIAGNOSIS — I130 Hypertensive heart and chronic kidney disease with heart failure and stage 1 through stage 4 chronic kidney disease, or unspecified chronic kidney disease: Secondary | ICD-10-CM | POA: Diagnosis not present

## 2018-11-24 DIAGNOSIS — D509 Iron deficiency anemia, unspecified: Secondary | ICD-10-CM | POA: Diagnosis not present

## 2018-11-24 DIAGNOSIS — I252 Old myocardial infarction: Secondary | ICD-10-CM | POA: Diagnosis not present

## 2018-11-24 DIAGNOSIS — M1712 Unilateral primary osteoarthritis, left knee: Secondary | ICD-10-CM | POA: Diagnosis not present

## 2018-11-24 DIAGNOSIS — Z7982 Long term (current) use of aspirin: Secondary | ICD-10-CM | POA: Diagnosis not present

## 2018-11-24 DIAGNOSIS — E1122 Type 2 diabetes mellitus with diabetic chronic kidney disease: Secondary | ICD-10-CM | POA: Diagnosis not present

## 2018-11-24 DIAGNOSIS — D631 Anemia in chronic kidney disease: Secondary | ICD-10-CM | POA: Diagnosis not present

## 2018-11-24 DIAGNOSIS — M160 Bilateral primary osteoarthritis of hip: Secondary | ICD-10-CM | POA: Diagnosis not present

## 2018-11-24 DIAGNOSIS — Z951 Presence of aortocoronary bypass graft: Secondary | ICD-10-CM | POA: Diagnosis not present

## 2018-11-24 DIAGNOSIS — I080 Rheumatic disorders of both mitral and aortic valves: Secondary | ICD-10-CM | POA: Diagnosis not present

## 2018-11-24 DIAGNOSIS — Z7902 Long term (current) use of antithrombotics/antiplatelets: Secondary | ICD-10-CM | POA: Diagnosis not present

## 2018-11-24 DIAGNOSIS — K625 Hemorrhage of anus and rectum: Secondary | ICD-10-CM

## 2018-11-24 DIAGNOSIS — Z794 Long term (current) use of insulin: Secondary | ICD-10-CM | POA: Diagnosis not present

## 2018-11-24 DIAGNOSIS — R933 Abnormal findings on diagnostic imaging of other parts of digestive tract: Secondary | ICD-10-CM

## 2018-11-24 LAB — CMP14+EGFR
ALBUMIN: 3.5 g/dL (ref 3.5–4.7)
ALK PHOS: 42 IU/L (ref 39–117)
ALT: 10 IU/L (ref 0–32)
AST: 12 IU/L (ref 0–40)
Albumin/Globulin Ratio: 1.4 (ref 1.2–2.2)
BILIRUBIN TOTAL: 0.3 mg/dL (ref 0.0–1.2)
BUN / CREAT RATIO: 19 (ref 12–28)
BUN: 25 mg/dL (ref 8–27)
CHLORIDE: 103 mmol/L (ref 96–106)
CO2: 23 mmol/L (ref 20–29)
Calcium: 9.4 mg/dL (ref 8.7–10.3)
Creatinine, Ser: 1.34 mg/dL — ABNORMAL HIGH (ref 0.57–1.00)
GFR calc non Af Amer: 36 mL/min/{1.73_m2} — ABNORMAL LOW (ref >59)
GFR, EST AFRICAN AMERICAN: 42 mL/min/{1.73_m2} — AB (ref >59)
Globulin, Total: 2.5 g/dL (ref 1.5–4.5)
Glucose: 146 mg/dL — ABNORMAL HIGH (ref 65–99)
POTASSIUM: 4.1 mmol/L (ref 3.5–5.2)
SODIUM: 141 mmol/L (ref 134–144)
TOTAL PROTEIN: 6 g/dL (ref 6.0–8.5)

## 2018-11-24 LAB — BRAIN NATRIURETIC PEPTIDE: BNP: 364.8 pg/mL — AB (ref 0.0–100.0)

## 2018-11-24 MED ORDER — CLENPIQ 10-3.5-12 MG-GM -GM/160ML PO SOLN: 1.0000 | Freq: Once | ORAL | 0 refills | Status: AC

## 2018-11-24 NOTE — Telephone Encounter (Signed)
Called and informed pt's daughter of pre-op appt 02/08/19 at 12:45pm. Letter mailed with procedure instructions.

## 2018-11-24 NOTE — Telephone Encounter (Signed)
Pt's daughter, [name],is aware and said OK to schedule.

## 2018-11-24 NOTE — Telephone Encounter (Signed)
Called pt's daughter, [name], TCS w/Propofol w/[facility] scheduled for 02/14/19 at 8:45am. Rx for prep sent to pharmacy. Orders entered. Will mail instructions after pre-op is scheduled.

## 2018-11-28 NOTE — Telephone Encounter (Signed)
LMOM for a return call.  

## 2018-11-28 NOTE — Addendum Note (Signed)
Addended by: Mahala Menghini on: 11/28/2018 01:35 PM   Modules accepted: Orders

## 2018-11-28 NOTE — Telephone Encounter (Signed)
PT's daughter. Alene is aware and will take pt in the next few days. She is aware not to go on Christmas.

## 2018-11-28 NOTE — Telephone Encounter (Signed)
SINCE HER TCS IS NOT UNTIL 02/2019, LET'S HAVE HER GET LABS TO FOLLOW UP ANEMIA.   NEEDS CBC, IRON/TIBC/FERRITIN. ORDERS PLACED.

## 2018-11-29 DIAGNOSIS — H5462 Unqualified visual loss, left eye, normal vision right eye: Secondary | ICD-10-CM | POA: Diagnosis not present

## 2018-11-29 DIAGNOSIS — I130 Hypertensive heart and chronic kidney disease with heart failure and stage 1 through stage 4 chronic kidney disease, or unspecified chronic kidney disease: Secondary | ICD-10-CM | POA: Diagnosis not present

## 2018-11-29 DIAGNOSIS — E1122 Type 2 diabetes mellitus with diabetic chronic kidney disease: Secondary | ICD-10-CM | POA: Diagnosis not present

## 2018-11-29 DIAGNOSIS — Z794 Long term (current) use of insulin: Secondary | ICD-10-CM | POA: Diagnosis not present

## 2018-11-29 DIAGNOSIS — D509 Iron deficiency anemia, unspecified: Secondary | ICD-10-CM | POA: Diagnosis not present

## 2018-11-29 DIAGNOSIS — Z96651 Presence of right artificial knee joint: Secondary | ICD-10-CM | POA: Diagnosis not present

## 2018-11-29 DIAGNOSIS — Z7982 Long term (current) use of aspirin: Secondary | ICD-10-CM | POA: Diagnosis not present

## 2018-11-29 DIAGNOSIS — I080 Rheumatic disorders of both mitral and aortic valves: Secondary | ICD-10-CM | POA: Diagnosis not present

## 2018-11-29 DIAGNOSIS — Z951 Presence of aortocoronary bypass graft: Secondary | ICD-10-CM | POA: Diagnosis not present

## 2018-11-29 DIAGNOSIS — M419 Scoliosis, unspecified: Secondary | ICD-10-CM | POA: Diagnosis not present

## 2018-11-29 DIAGNOSIS — I2510 Atherosclerotic heart disease of native coronary artery without angina pectoris: Secondary | ICD-10-CM | POA: Diagnosis not present

## 2018-11-29 DIAGNOSIS — Z7902 Long term (current) use of antithrombotics/antiplatelets: Secondary | ICD-10-CM | POA: Diagnosis not present

## 2018-11-29 DIAGNOSIS — I5033 Acute on chronic diastolic (congestive) heart failure: Secondary | ICD-10-CM | POA: Diagnosis not present

## 2018-11-29 DIAGNOSIS — I252 Old myocardial infarction: Secondary | ICD-10-CM | POA: Diagnosis not present

## 2018-11-29 DIAGNOSIS — M160 Bilateral primary osteoarthritis of hip: Secondary | ICD-10-CM | POA: Diagnosis not present

## 2018-11-29 DIAGNOSIS — M1712 Unilateral primary osteoarthritis, left knee: Secondary | ICD-10-CM | POA: Diagnosis not present

## 2018-11-29 DIAGNOSIS — H9190 Unspecified hearing loss, unspecified ear: Secondary | ICD-10-CM | POA: Diagnosis not present

## 2018-11-29 DIAGNOSIS — N183 Chronic kidney disease, stage 3 (moderate): Secondary | ICD-10-CM | POA: Diagnosis not present

## 2018-11-29 DIAGNOSIS — Z9861 Coronary angioplasty status: Secondary | ICD-10-CM | POA: Diagnosis not present

## 2018-11-29 DIAGNOSIS — D631 Anemia in chronic kidney disease: Secondary | ICD-10-CM | POA: Diagnosis not present

## 2018-11-29 DIAGNOSIS — J45909 Unspecified asthma, uncomplicated: Secondary | ICD-10-CM | POA: Diagnosis not present

## 2018-11-29 DIAGNOSIS — E785 Hyperlipidemia, unspecified: Secondary | ICD-10-CM | POA: Diagnosis not present

## 2018-12-01 DIAGNOSIS — N183 Chronic kidney disease, stage 3 (moderate): Secondary | ICD-10-CM | POA: Diagnosis not present

## 2018-12-01 DIAGNOSIS — D631 Anemia in chronic kidney disease: Secondary | ICD-10-CM | POA: Diagnosis not present

## 2018-12-01 DIAGNOSIS — I130 Hypertensive heart and chronic kidney disease with heart failure and stage 1 through stage 4 chronic kidney disease, or unspecified chronic kidney disease: Secondary | ICD-10-CM | POA: Diagnosis not present

## 2018-12-01 DIAGNOSIS — E785 Hyperlipidemia, unspecified: Secondary | ICD-10-CM | POA: Diagnosis not present

## 2018-12-01 DIAGNOSIS — J45909 Unspecified asthma, uncomplicated: Secondary | ICD-10-CM | POA: Diagnosis not present

## 2018-12-01 DIAGNOSIS — Z96651 Presence of right artificial knee joint: Secondary | ICD-10-CM | POA: Diagnosis not present

## 2018-12-01 DIAGNOSIS — I5033 Acute on chronic diastolic (congestive) heart failure: Secondary | ICD-10-CM | POA: Diagnosis not present

## 2018-12-01 DIAGNOSIS — I080 Rheumatic disorders of both mitral and aortic valves: Secondary | ICD-10-CM | POA: Diagnosis not present

## 2018-12-01 DIAGNOSIS — M160 Bilateral primary osteoarthritis of hip: Secondary | ICD-10-CM | POA: Diagnosis not present

## 2018-12-01 DIAGNOSIS — D509 Iron deficiency anemia, unspecified: Secondary | ICD-10-CM | POA: Diagnosis not present

## 2018-12-01 DIAGNOSIS — Z7982 Long term (current) use of aspirin: Secondary | ICD-10-CM | POA: Diagnosis not present

## 2018-12-01 DIAGNOSIS — I2510 Atherosclerotic heart disease of native coronary artery without angina pectoris: Secondary | ICD-10-CM | POA: Diagnosis not present

## 2018-12-01 DIAGNOSIS — H9190 Unspecified hearing loss, unspecified ear: Secondary | ICD-10-CM | POA: Diagnosis not present

## 2018-12-01 DIAGNOSIS — E1122 Type 2 diabetes mellitus with diabetic chronic kidney disease: Secondary | ICD-10-CM | POA: Diagnosis not present

## 2018-12-01 DIAGNOSIS — I252 Old myocardial infarction: Secondary | ICD-10-CM | POA: Diagnosis not present

## 2018-12-01 DIAGNOSIS — Z7902 Long term (current) use of antithrombotics/antiplatelets: Secondary | ICD-10-CM | POA: Diagnosis not present

## 2018-12-01 DIAGNOSIS — Z9861 Coronary angioplasty status: Secondary | ICD-10-CM | POA: Diagnosis not present

## 2018-12-01 DIAGNOSIS — M1712 Unilateral primary osteoarthritis, left knee: Secondary | ICD-10-CM | POA: Diagnosis not present

## 2018-12-01 DIAGNOSIS — H5462 Unqualified visual loss, left eye, normal vision right eye: Secondary | ICD-10-CM | POA: Diagnosis not present

## 2018-12-01 DIAGNOSIS — Z794 Long term (current) use of insulin: Secondary | ICD-10-CM | POA: Diagnosis not present

## 2018-12-01 DIAGNOSIS — Z951 Presence of aortocoronary bypass graft: Secondary | ICD-10-CM | POA: Diagnosis not present

## 2018-12-01 DIAGNOSIS — M419 Scoliosis, unspecified: Secondary | ICD-10-CM | POA: Diagnosis not present

## 2018-12-05 DIAGNOSIS — Z7982 Long term (current) use of aspirin: Secondary | ICD-10-CM | POA: Diagnosis not present

## 2018-12-05 DIAGNOSIS — I5033 Acute on chronic diastolic (congestive) heart failure: Secondary | ICD-10-CM | POA: Diagnosis not present

## 2018-12-05 DIAGNOSIS — I130 Hypertensive heart and chronic kidney disease with heart failure and stage 1 through stage 4 chronic kidney disease, or unspecified chronic kidney disease: Secondary | ICD-10-CM | POA: Diagnosis not present

## 2018-12-05 DIAGNOSIS — J45909 Unspecified asthma, uncomplicated: Secondary | ICD-10-CM | POA: Diagnosis not present

## 2018-12-05 DIAGNOSIS — I252 Old myocardial infarction: Secondary | ICD-10-CM | POA: Diagnosis not present

## 2018-12-05 DIAGNOSIS — M160 Bilateral primary osteoarthritis of hip: Secondary | ICD-10-CM | POA: Diagnosis not present

## 2018-12-05 DIAGNOSIS — Z951 Presence of aortocoronary bypass graft: Secondary | ICD-10-CM | POA: Diagnosis not present

## 2018-12-05 DIAGNOSIS — M1712 Unilateral primary osteoarthritis, left knee: Secondary | ICD-10-CM | POA: Diagnosis not present

## 2018-12-05 DIAGNOSIS — I2510 Atherosclerotic heart disease of native coronary artery without angina pectoris: Secondary | ICD-10-CM | POA: Diagnosis not present

## 2018-12-05 DIAGNOSIS — H5462 Unqualified visual loss, left eye, normal vision right eye: Secondary | ICD-10-CM | POA: Diagnosis not present

## 2018-12-05 DIAGNOSIS — Z96651 Presence of right artificial knee joint: Secondary | ICD-10-CM | POA: Diagnosis not present

## 2018-12-05 DIAGNOSIS — Z7902 Long term (current) use of antithrombotics/antiplatelets: Secondary | ICD-10-CM | POA: Diagnosis not present

## 2018-12-05 DIAGNOSIS — D631 Anemia in chronic kidney disease: Secondary | ICD-10-CM | POA: Diagnosis not present

## 2018-12-05 DIAGNOSIS — D509 Iron deficiency anemia, unspecified: Secondary | ICD-10-CM | POA: Diagnosis not present

## 2018-12-05 DIAGNOSIS — Z9861 Coronary angioplasty status: Secondary | ICD-10-CM | POA: Diagnosis not present

## 2018-12-05 DIAGNOSIS — N183 Chronic kidney disease, stage 3 (moderate): Secondary | ICD-10-CM | POA: Diagnosis not present

## 2018-12-05 DIAGNOSIS — Z794 Long term (current) use of insulin: Secondary | ICD-10-CM | POA: Diagnosis not present

## 2018-12-05 DIAGNOSIS — E1122 Type 2 diabetes mellitus with diabetic chronic kidney disease: Secondary | ICD-10-CM | POA: Diagnosis not present

## 2018-12-05 DIAGNOSIS — E785 Hyperlipidemia, unspecified: Secondary | ICD-10-CM | POA: Diagnosis not present

## 2018-12-05 DIAGNOSIS — M419 Scoliosis, unspecified: Secondary | ICD-10-CM | POA: Diagnosis not present

## 2018-12-05 DIAGNOSIS — H9190 Unspecified hearing loss, unspecified ear: Secondary | ICD-10-CM | POA: Diagnosis not present

## 2018-12-05 DIAGNOSIS — I080 Rheumatic disorders of both mitral and aortic valves: Secondary | ICD-10-CM | POA: Diagnosis not present

## 2018-12-06 DIAGNOSIS — M419 Scoliosis, unspecified: Secondary | ICD-10-CM | POA: Diagnosis not present

## 2018-12-06 DIAGNOSIS — N183 Chronic kidney disease, stage 3 (moderate): Secondary | ICD-10-CM | POA: Diagnosis not present

## 2018-12-06 DIAGNOSIS — Z7982 Long term (current) use of aspirin: Secondary | ICD-10-CM | POA: Diagnosis not present

## 2018-12-06 DIAGNOSIS — H5462 Unqualified visual loss, left eye, normal vision right eye: Secondary | ICD-10-CM | POA: Diagnosis not present

## 2018-12-06 DIAGNOSIS — Z96651 Presence of right artificial knee joint: Secondary | ICD-10-CM | POA: Diagnosis not present

## 2018-12-06 DIAGNOSIS — M1712 Unilateral primary osteoarthritis, left knee: Secondary | ICD-10-CM | POA: Diagnosis not present

## 2018-12-06 DIAGNOSIS — I080 Rheumatic disorders of both mitral and aortic valves: Secondary | ICD-10-CM | POA: Diagnosis not present

## 2018-12-06 DIAGNOSIS — E1122 Type 2 diabetes mellitus with diabetic chronic kidney disease: Secondary | ICD-10-CM | POA: Diagnosis not present

## 2018-12-06 DIAGNOSIS — I2510 Atherosclerotic heart disease of native coronary artery without angina pectoris: Secondary | ICD-10-CM | POA: Diagnosis not present

## 2018-12-06 DIAGNOSIS — H9190 Unspecified hearing loss, unspecified ear: Secondary | ICD-10-CM | POA: Diagnosis not present

## 2018-12-06 DIAGNOSIS — D631 Anemia in chronic kidney disease: Secondary | ICD-10-CM | POA: Diagnosis not present

## 2018-12-06 DIAGNOSIS — I252 Old myocardial infarction: Secondary | ICD-10-CM | POA: Diagnosis not present

## 2018-12-06 DIAGNOSIS — I130 Hypertensive heart and chronic kidney disease with heart failure and stage 1 through stage 4 chronic kidney disease, or unspecified chronic kidney disease: Secondary | ICD-10-CM | POA: Diagnosis not present

## 2018-12-06 DIAGNOSIS — D509 Iron deficiency anemia, unspecified: Secondary | ICD-10-CM | POA: Diagnosis not present

## 2018-12-06 DIAGNOSIS — I5033 Acute on chronic diastolic (congestive) heart failure: Secondary | ICD-10-CM | POA: Diagnosis not present

## 2018-12-06 DIAGNOSIS — M160 Bilateral primary osteoarthritis of hip: Secondary | ICD-10-CM | POA: Diagnosis not present

## 2018-12-06 DIAGNOSIS — Z951 Presence of aortocoronary bypass graft: Secondary | ICD-10-CM | POA: Diagnosis not present

## 2018-12-06 DIAGNOSIS — Z9861 Coronary angioplasty status: Secondary | ICD-10-CM | POA: Diagnosis not present

## 2018-12-06 DIAGNOSIS — J45909 Unspecified asthma, uncomplicated: Secondary | ICD-10-CM | POA: Diagnosis not present

## 2018-12-06 DIAGNOSIS — E785 Hyperlipidemia, unspecified: Secondary | ICD-10-CM | POA: Diagnosis not present

## 2018-12-06 DIAGNOSIS — Z7902 Long term (current) use of antithrombotics/antiplatelets: Secondary | ICD-10-CM | POA: Diagnosis not present

## 2018-12-06 DIAGNOSIS — Z794 Long term (current) use of insulin: Secondary | ICD-10-CM | POA: Diagnosis not present

## 2018-12-09 DIAGNOSIS — D649 Anemia, unspecified: Secondary | ICD-10-CM | POA: Diagnosis not present

## 2018-12-10 LAB — CBC WITH DIFFERENTIAL/PLATELET
Absolute Monocytes: 331 cells/uL (ref 200–950)
Basophils Absolute: 29 cells/uL (ref 0–200)
Basophils Relative: 0.5 %
EOS PCT: 1.4 %
Eosinophils Absolute: 80 cells/uL (ref 15–500)
HCT: 35.2 % (ref 35.0–45.0)
Hemoglobin: 11.3 g/dL — ABNORMAL LOW (ref 11.7–15.5)
Lymphs Abs: 1146 cells/uL (ref 850–3900)
MCH: 28 pg (ref 27.0–33.0)
MCHC: 32.1 g/dL (ref 32.0–36.0)
MCV: 87.3 fL (ref 80.0–100.0)
MPV: 11.4 fL (ref 7.5–12.5)
Monocytes Relative: 5.8 %
Neutro Abs: 4115 cells/uL (ref 1500–7800)
Neutrophils Relative %: 72.2 %
PLATELETS: 181 10*3/uL (ref 140–400)
RBC: 4.03 10*6/uL (ref 3.80–5.10)
RDW: 13.4 % (ref 11.0–15.0)
Total Lymphocyte: 20.1 %
WBC: 5.7 10*3/uL (ref 3.8–10.8)

## 2018-12-10 LAB — IRON,TIBC AND FERRITIN PANEL
%SAT: 16 % (calc) (ref 16–45)
Ferritin: 47 ng/mL (ref 16–288)
Iron: 45 ug/dL (ref 45–160)
TIBC: 283 ug/dL (ref 250–450)

## 2018-12-13 DIAGNOSIS — H9190 Unspecified hearing loss, unspecified ear: Secondary | ICD-10-CM | POA: Diagnosis not present

## 2018-12-13 DIAGNOSIS — M1712 Unilateral primary osteoarthritis, left knee: Secondary | ICD-10-CM | POA: Diagnosis not present

## 2018-12-13 DIAGNOSIS — Z96651 Presence of right artificial knee joint: Secondary | ICD-10-CM | POA: Diagnosis not present

## 2018-12-13 DIAGNOSIS — E1122 Type 2 diabetes mellitus with diabetic chronic kidney disease: Secondary | ICD-10-CM | POA: Diagnosis not present

## 2018-12-13 DIAGNOSIS — Z7902 Long term (current) use of antithrombotics/antiplatelets: Secondary | ICD-10-CM | POA: Diagnosis not present

## 2018-12-13 DIAGNOSIS — Z7982 Long term (current) use of aspirin: Secondary | ICD-10-CM | POA: Diagnosis not present

## 2018-12-13 DIAGNOSIS — I252 Old myocardial infarction: Secondary | ICD-10-CM | POA: Diagnosis not present

## 2018-12-13 DIAGNOSIS — H5462 Unqualified visual loss, left eye, normal vision right eye: Secondary | ICD-10-CM | POA: Diagnosis not present

## 2018-12-13 DIAGNOSIS — M419 Scoliosis, unspecified: Secondary | ICD-10-CM | POA: Diagnosis not present

## 2018-12-13 DIAGNOSIS — Z9861 Coronary angioplasty status: Secondary | ICD-10-CM | POA: Diagnosis not present

## 2018-12-13 DIAGNOSIS — D631 Anemia in chronic kidney disease: Secondary | ICD-10-CM | POA: Diagnosis not present

## 2018-12-13 DIAGNOSIS — N183 Chronic kidney disease, stage 3 (moderate): Secondary | ICD-10-CM | POA: Diagnosis not present

## 2018-12-13 DIAGNOSIS — D509 Iron deficiency anemia, unspecified: Secondary | ICD-10-CM | POA: Diagnosis not present

## 2018-12-13 DIAGNOSIS — I5033 Acute on chronic diastolic (congestive) heart failure: Secondary | ICD-10-CM | POA: Diagnosis not present

## 2018-12-13 DIAGNOSIS — J45909 Unspecified asthma, uncomplicated: Secondary | ICD-10-CM | POA: Diagnosis not present

## 2018-12-13 DIAGNOSIS — Z951 Presence of aortocoronary bypass graft: Secondary | ICD-10-CM | POA: Diagnosis not present

## 2018-12-13 DIAGNOSIS — I2510 Atherosclerotic heart disease of native coronary artery without angina pectoris: Secondary | ICD-10-CM | POA: Diagnosis not present

## 2018-12-13 DIAGNOSIS — I130 Hypertensive heart and chronic kidney disease with heart failure and stage 1 through stage 4 chronic kidney disease, or unspecified chronic kidney disease: Secondary | ICD-10-CM | POA: Diagnosis not present

## 2018-12-13 DIAGNOSIS — M160 Bilateral primary osteoarthritis of hip: Secondary | ICD-10-CM | POA: Diagnosis not present

## 2018-12-13 DIAGNOSIS — Z794 Long term (current) use of insulin: Secondary | ICD-10-CM | POA: Diagnosis not present

## 2018-12-13 DIAGNOSIS — E785 Hyperlipidemia, unspecified: Secondary | ICD-10-CM | POA: Diagnosis not present

## 2018-12-13 DIAGNOSIS — I080 Rheumatic disorders of both mitral and aortic valves: Secondary | ICD-10-CM | POA: Diagnosis not present

## 2018-12-14 ENCOUNTER — Ambulatory Visit (INDEPENDENT_AMBULATORY_CARE_PROVIDER_SITE_OTHER): Payer: Medicare Other | Admitting: Family Medicine

## 2018-12-14 ENCOUNTER — Encounter: Payer: Self-pay | Admitting: Family Medicine

## 2018-12-14 VITALS — BP 174/76 | HR 62 | Temp 96.4°F | Ht 66.0 in | Wt 147.1 lb

## 2018-12-14 DIAGNOSIS — J40 Bronchitis, not specified as acute or chronic: Secondary | ICD-10-CM | POA: Diagnosis not present

## 2018-12-14 DIAGNOSIS — I5033 Acute on chronic diastolic (congestive) heart failure: Secondary | ICD-10-CM | POA: Diagnosis not present

## 2018-12-14 MED ORDER — SPIRONOLACTONE 25 MG PO TABS: ORAL_TABLET | ORAL | 0 refills | Status: DC

## 2018-12-14 MED ORDER — BUDESONIDE-FORMOTEROL FUMARATE 80-4.5 MCG/ACT IN AERO: 2.0000 | INHALATION_SPRAY | Freq: Two times a day (BID) | RESPIRATORY_TRACT | 3 refills | Status: DC

## 2018-12-14 MED ORDER — FUROSEMIDE 20 MG PO TABS: 20.0000 mg | ORAL_TABLET | Freq: Every day | ORAL | 2 refills | Status: DC

## 2018-12-14 MED ORDER — ALBUTEROL SULFATE HFA 108 (90 BASE) MCG/ACT IN AERS: 2.0000 | INHALATION_SPRAY | Freq: Four times a day (QID) | RESPIRATORY_TRACT | 0 refills | Status: DC | PRN

## 2018-12-14 MED ORDER — METHENAMINE HIPPURATE 1 G PO TABS: 1.0000 g | ORAL_TABLET | Freq: Two times a day (BID) | ORAL | 5 refills | Status: DC

## 2018-12-14 NOTE — Patient Instructions (Signed)
Consider Echinacea for cold prevention

## 2018-12-15 DIAGNOSIS — H9190 Unspecified hearing loss, unspecified ear: Secondary | ICD-10-CM | POA: Diagnosis not present

## 2018-12-15 DIAGNOSIS — I2510 Atherosclerotic heart disease of native coronary artery without angina pectoris: Secondary | ICD-10-CM | POA: Diagnosis not present

## 2018-12-15 DIAGNOSIS — M1712 Unilateral primary osteoarthritis, left knee: Secondary | ICD-10-CM | POA: Diagnosis not present

## 2018-12-15 DIAGNOSIS — E1122 Type 2 diabetes mellitus with diabetic chronic kidney disease: Secondary | ICD-10-CM | POA: Diagnosis not present

## 2018-12-15 DIAGNOSIS — Z794 Long term (current) use of insulin: Secondary | ICD-10-CM | POA: Diagnosis not present

## 2018-12-15 DIAGNOSIS — Z7982 Long term (current) use of aspirin: Secondary | ICD-10-CM | POA: Diagnosis not present

## 2018-12-15 DIAGNOSIS — J45909 Unspecified asthma, uncomplicated: Secondary | ICD-10-CM | POA: Diagnosis not present

## 2018-12-15 DIAGNOSIS — M160 Bilateral primary osteoarthritis of hip: Secondary | ICD-10-CM | POA: Diagnosis not present

## 2018-12-15 DIAGNOSIS — Z951 Presence of aortocoronary bypass graft: Secondary | ICD-10-CM | POA: Diagnosis not present

## 2018-12-15 DIAGNOSIS — E785 Hyperlipidemia, unspecified: Secondary | ICD-10-CM | POA: Diagnosis not present

## 2018-12-15 DIAGNOSIS — N183 Chronic kidney disease, stage 3 (moderate): Secondary | ICD-10-CM | POA: Diagnosis not present

## 2018-12-15 DIAGNOSIS — I252 Old myocardial infarction: Secondary | ICD-10-CM | POA: Diagnosis not present

## 2018-12-15 DIAGNOSIS — Z7902 Long term (current) use of antithrombotics/antiplatelets: Secondary | ICD-10-CM | POA: Diagnosis not present

## 2018-12-15 DIAGNOSIS — I080 Rheumatic disorders of both mitral and aortic valves: Secondary | ICD-10-CM | POA: Diagnosis not present

## 2018-12-15 DIAGNOSIS — Z9861 Coronary angioplasty status: Secondary | ICD-10-CM | POA: Diagnosis not present

## 2018-12-15 DIAGNOSIS — D509 Iron deficiency anemia, unspecified: Secondary | ICD-10-CM | POA: Diagnosis not present

## 2018-12-15 DIAGNOSIS — M419 Scoliosis, unspecified: Secondary | ICD-10-CM | POA: Diagnosis not present

## 2018-12-15 DIAGNOSIS — I130 Hypertensive heart and chronic kidney disease with heart failure and stage 1 through stage 4 chronic kidney disease, or unspecified chronic kidney disease: Secondary | ICD-10-CM | POA: Diagnosis not present

## 2018-12-15 DIAGNOSIS — D631 Anemia in chronic kidney disease: Secondary | ICD-10-CM | POA: Diagnosis not present

## 2018-12-15 DIAGNOSIS — H5462 Unqualified visual loss, left eye, normal vision right eye: Secondary | ICD-10-CM | POA: Diagnosis not present

## 2018-12-15 DIAGNOSIS — I5033 Acute on chronic diastolic (congestive) heart failure: Secondary | ICD-10-CM | POA: Diagnosis not present

## 2018-12-15 DIAGNOSIS — Z96651 Presence of right artificial knee joint: Secondary | ICD-10-CM | POA: Diagnosis not present

## 2018-12-15 LAB — CMP14+EGFR
ALBUMIN: 3.9 g/dL (ref 3.5–4.7)
ALT: 9 IU/L (ref 0–32)
AST: 14 IU/L (ref 0–40)
Albumin/Globulin Ratio: 1.6 (ref 1.2–2.2)
Alkaline Phosphatase: 42 IU/L (ref 39–117)
BUN/Creatinine Ratio: 22 (ref 12–28)
BUN: 28 mg/dL — ABNORMAL HIGH (ref 8–27)
Bilirubin Total: 0.3 mg/dL (ref 0.0–1.2)
CO2: 23 mmol/L (ref 20–29)
Calcium: 9.7 mg/dL (ref 8.7–10.3)
Chloride: 103 mmol/L (ref 96–106)
Creatinine, Ser: 1.27 mg/dL — ABNORMAL HIGH (ref 0.57–1.00)
GFR calc Af Amer: 44 mL/min/{1.73_m2} — ABNORMAL LOW (ref >59)
GFR, EST NON AFRICAN AMERICAN: 39 mL/min/{1.73_m2} — AB (ref >59)
Globulin, Total: 2.4 g/dL (ref 1.5–4.5)
Glucose: 128 mg/dL — ABNORMAL HIGH (ref 65–99)
Potassium: 4.3 mmol/L (ref 3.5–5.2)
Sodium: 140 mmol/L (ref 134–144)
Total Protein: 6.3 g/dL (ref 6.0–8.5)

## 2018-12-15 LAB — BRAIN NATRIURETIC PEPTIDE: BNP: 382.4 pg/mL — ABNORMAL HIGH (ref 0.0–100.0)

## 2018-12-21 NOTE — Telephone Encounter (Signed)
LMOM to call.

## 2018-12-22 NOTE — Telephone Encounter (Signed)
Pt's daughter [name] is aware.

## 2018-12-25 ENCOUNTER — Encounter: Payer: Self-pay | Admitting: Family Medicine

## 2018-12-25 NOTE — Progress Notes (Signed)
Subjective:  [name], female    DOB: [birth year]  Age: 85 y.o. MRN: [id number]  CC: Congestive Heart Failure (pt here today for 3 week f/u from her CHF)   HPI [name] presents for recheck of CHF. Less dyspneic than last visit. Taking diuretics as discussed. Needing BMP to check electrolytes.  Depression screen [facility] 2/9 12/14/2018 11/23/2018 11/16/2018  Decreased Interest 1 0 1  Down, Depressed, Hopeless 1 0 1  PHQ - 2 Score 2 0 2  Altered sleeping 1 - 2  Tired, decreased energy 1 - 1  Change in appetite 0 - 1  Feeling bad or failure about yourself  0 - 1  Trouble concentrating 0 - 1  Moving slowly or fidgety/restless 0 - 1  Suicidal thoughts 0 - 0  PHQ-9 Score 4 - 9  Difficult doing work/chores - - -  Some recent data might be hidden    History [name] has a past medical history of Anemia, Anxiety, Aortic stenosis, Arthritis, Asthma, Balance problems, CAD (coronary artery disease), Carotid stenosis, CHF (congestive heart failure) ([place]), CKD (chronic kidney disease), CVA (cerebral infarction), Dementia ([place]), Depression, Diabetes mellitus, GERD (gastroesophageal reflux disease), Hearing loss, Hypertension, Iron deficiency anemia (05/27/2011), Myocardial infarction [facility]), Renal insufficiency, Scoliosis, Shortness of breath dyspnea, Vertigo, and Vision loss.   She has a past surgical history that includes [facility] of [place] (April 2013); Coronary artery bypass graft (2006); Coronary angioplasty; Total abdominal hysterectomy (~85 years old); Cholecystectomy; Appendectomy; Cataract extraction (Bilateral, 5 years ago); Total knee arthroplasty (Right, 08/13/2015); Knee surgery (Right); and Colonoscopy (2005).   Her family history includes Cancer in her brother; Diabetes in her sister and sister; Early death in her sister and son; Heart attack in her brother, brother, sister, and son; Heart attack (age of onset: 66) in her daughter; Heart disease in her brother, brother,  and brother; Hypertension in her sister; Osteoporosis in her sister; Pancreatitis in her son.She reports that she has never smoked. She has never used smokeless tobacco. She reports that she does not drink alcohol or use drugs.    ROS Review of Systems  Objective:  BP (!) 174/76   Pulse 62   Temp (!) 96.4 F (35.8 C) (Oral)   Ht 5' 6"  (1.676 m)   Wt 147 lb 2 oz (66.7 kg)   BMI 23.75 kg/m   BP Readings from Last 3 Encounters:  12/14/18 (!) 174/76  11/23/18 (!) 118/52  11/23/18 (!) 145/62    Wt Readings from Last 3 Encounters:  12/14/18 147 lb 2 oz (66.7 kg)  11/23/18 147 lb (66.7 kg)  11/23/18 148 lb (67.1 kg)     Physical Exam Constitutional:      General: She is not in acute distress.    Appearance: She is well-developed.  HENT:     Head: Normocephalic and atraumatic.     Right Ear: External ear normal.     Left Ear: External ear normal.     Nose: Nose normal.  Eyes:     Conjunctiva/sclera: Conjunctivae normal.     Pupils: Pupils are equal, round, and reactive to light.  Neck:     Musculoskeletal: Normal range of motion and neck supple.     Thyroid: No thyromegaly.  Cardiovascular:     Rate and Rhythm: Normal rate and regular rhythm.     Heart sounds: Normal heart sounds. No murmur.  Pulmonary:     Effort: Pulmonary effort is normal. No respiratory distress.  Breath sounds: Normal breath sounds. No wheezing or rales.  Abdominal:     General: Bowel sounds are normal. There is no distension.     Palpations: Abdomen is soft.     Tenderness: There is no abdominal tenderness.  Musculoskeletal:     Right lower leg: Edema (decreased from last encounter) present.     Left lower leg: Edema (decreased from last encounter) present.  Lymphadenopathy:     Cervical: No cervical adenopathy.  Skin:    General: Skin is warm and dry.  Neurological:     Mental Status: She is alert and oriented to person, place, and time.     Deep Tendon Reflexes: Reflexes are normal  and symmetric.  Psychiatric:        Behavior: Behavior normal.        Thought Content: Thought content normal.        Judgment: Judgment normal.      Results for orders placed or performed in visit on 12/14/18  Brain natriuretic peptide  Result Value Ref Range   BNP 382.4 (H) 0.0 - 100.0 pg/mL  CMP14+EGFR  Result Value Ref Range   Glucose 128 (H) 65 - 99 mg/dL   BUN 28 (H) 8 - 27 mg/dL   Creatinine, Ser 1.27 (H) 0.57 - 1.00 mg/dL   GFR calc non Af Amer 39 (L) >59 mL/min/1.73   GFR calc Af Amer 44 (L) >59 mL/min/1.73   BUN/Creatinine Ratio 22 12 - 28   Sodium 140 134 - 144 mmol/L   Potassium 4.3 3.5 - 5.2 mmol/L   Chloride 103 96 - 106 mmol/L   CO2 23 20 - 29 mmol/L   Calcium 9.7 8.7 - 10.3 mg/dL   Total Protein 6.3 6.0 - 8.5 g/dL   Albumin 3.9 3.5 - 4.7 g/dL   Globulin, Total 2.4 1.5 - 4.5 g/dL   Albumin/Globulin Ratio 1.6 1.2 - 2.2   Bilirubin Total 0.3 0.0 - 1.2 mg/dL   Alkaline Phosphatase 42 39 - 117 IU/L   AST 14 0 - 40 IU/L   ALT 9 0 - 32 IU/L    Assessment & Plan:   [name] was seen today for congestive heart failure.  Diagnoses and all orders for this visit:  Acute on chronic diastolic congestive heart failure (HCC) -     Brain natriuretic peptide -     CMP14+EGFR  Bronchitis -     albuterol (PROVENTIL HFA;VENTOLIN HFA) 108 (90 Base) MCG/ACT inhaler; Inhale 2 puffs into the lungs every 6 (six) hours as needed for wheezing or shortness of breath. -     budesonide-formoterol (SYMBICORT) 80-4.5 MCG/ACT inhaler; Inhale 2 puffs into the lungs 2 (two) times daily.  Other orders -     furosemide (LASIX) 20 MG tablet; Take 1 tablet (20 mg total) by mouth daily. Take two tablets by mouth in the morning and 1 tablet at lunch -     methenamine (HIPREX) 1 g tablet; Take 1 tablet (1 g total) by mouth 2 (two) times daily. -     spironolactone (ALDACTONE) 25 MG tablet; Take 2 every AM       I have discontinued [name]'s levofloxacin. I have also changed her  furosemide. Additionally, I am having her maintain her Artificial Tear Ointment (DRY EYES OP), Multiple Vitamins-Iron (DAILY VITAMINS/IRON/BETA CAROT PO), aspirin, hydrocortisone, blood glucose meter kit and supplies, FORA LANCETS, glucose blood, insulin lispro, Insulin Pen Needle, Insulin Syringe-Needle U-100, polyethylene glycol, PARoxetine, metoprolol tartrate, metFORMIN, meclizine, lisinopril,  isosorbide mononitrate, clopidogrel, atorvastatin, amLODipine, esomeprazole, Insulin Detemir, nitroGLYCERIN, ferrous sulfate, albuterol, budesonide-formoterol, methenamine, and spironolactone.  Allergies as of 12/14/2018      Reactions   Advil [ibuprofen] Swelling   Heparin Other (See Comments)   Confusion, hallucinations.    Propoxyphene N-acetaminophen Other (See Comments)   Numbness all over. "floating" sensation.      Medication List       Accurate as of December 14, 2018 11:59 PM. Always use your most recent med list.        albuterol 108 (90 Base) MCG/ACT inhaler Commonly known as:  PROVENTIL HFA;VENTOLIN HFA Inhale 2 puffs into the lungs every 6 (six) hours as needed for wheezing or shortness of breath.   amLODipine 10 MG tablet Commonly known as:  NORVASC Take 1 tablet (10 mg total) by mouth every morning.   aspirin 81 MG chewable tablet Chew 81 mg by mouth at bedtime.   atorvastatin 20 MG tablet Commonly known as:  LIPITOR Take 1 tablet (20 mg total) by mouth at bedtime.   blood glucose meter kit and supplies Kit Dispense based on insurance coverage. Use 4 times a day as directed Dx: E11.9, Z79.4   budesonide-formoterol 80-4.5 MCG/ACT inhaler Commonly known as:  SYMBICORT Inhale 2 puffs into the lungs 2 (two) times daily.   clopidogrel 75 MG tablet Commonly known as:  PLAVIX Take 1 tablet (75 mg total) by mouth every morning.   DAILY VITAMINS/IRON/BETA CAROT PO Take 1 tablet by mouth at bedtime.   DRY EYES OP Apply 1 drop to eye daily as needed (dry/red eyes).     esomeprazole 40 MG capsule Commonly known as:  NEXIUM Take 1 capsule (40 mg total) by mouth daily.   ferrous sulfate 324 (65 Fe) MG Tbec Take by mouth daily.   FORA LANCETS Misc Test blood sugars twice daily   furosemide 20 MG tablet Commonly known as:  LASIX Take 1 tablet (20 mg total) by mouth daily. Take two tablets by mouth in the morning and 1 tablet at lunch   glucose blood test strip Commonly known as:  ACCU-CHEK AVIVA Use to check blood glucose twice a day   hydrocortisone 25 MG suppository Commonly known as:  ANUSOL-HC Place 1 suppository (25 mg total) rectally 2 (two) times daily.   Insulin Detemir 100 UNIT/ML Pen Commonly known as:  LEVEMIR FLEXTOUCH Inject 24 Units into the skin every morning.   insulin lispro 100 UNIT/ML KiwkPen Commonly known as:  HUMALOG KWIKPEN Inject 0.04-0.1 mLs (4-10 Units total) into the skin daily as needed (high blood sugar).   Insulin Pen Needle 31G X 8 MM Misc Commonly known as:  GLOBAL EASE INJECT PEN NEEDLES AS DIRECTED   Insulin Syringe-Needle U-100 31G X 5/16" 1 ML Misc Commonly known as:  EASY TOUCH INSULIN SYRINGE USE AS DIRECTED.   isosorbide mononitrate 30 MG 24 hr tablet Commonly known as:  IMDUR Take 1 tablet (30 mg total) by mouth daily.   lisinopril 20 MG tablet Commonly known as:  PRINIVIL,ZESTRIL Take 1 tablet (20 mg total) by mouth every morning.   meclizine 25 MG tablet Commonly known as:  ANTIVERT Take 1 tablet (25 mg total) by mouth 2 (two) times daily.   metFORMIN 500 MG 24 hr tablet Commonly known as:  GLUCOPHAGE-XR Take 1 tablet (500 mg total) by mouth every evening.   methenamine 1 g tablet Commonly known as:  HIPREX Take 1 tablet (1 g total) by mouth 2 (two) times daily.  metoprolol tartrate 50 MG tablet Commonly known as:  LOPRESSOR Take 1 tablet (50 mg total) by mouth 2 (two) times daily.   nitroGLYCERIN 0.4 MG SL tablet Commonly known as:  NITROSTAT Place 1 tablet (0.4 mg total) under  the tongue every 5 (five) minutes as needed for chest pain.   PARoxetine 20 MG tablet Commonly known as:  PAXIL Take 1 tablet (20 mg total) by mouth every morning.   polyethylene glycol packet Commonly known as:  MIRALAX Take 17 g by mouth daily.   spironolactone 25 MG tablet Commonly known as:  ALDACTONE Take 2 every AM        Follow-up: Return in about 6 weeks (around 01/25/2019).  [name], M.D.

## 2019-01-01 ENCOUNTER — Emergency Department (HOSPITAL_COMMUNITY): Payer: Medicare Other

## 2019-01-01 ENCOUNTER — Other Ambulatory Visit: Payer: Self-pay | Admitting: Family Medicine

## 2019-01-01 ENCOUNTER — Encounter (HOSPITAL_COMMUNITY): Payer: Self-pay | Admitting: Emergency Medicine

## 2019-01-01 ENCOUNTER — Other Ambulatory Visit: Payer: Self-pay

## 2019-01-01 ENCOUNTER — Emergency Department (HOSPITAL_COMMUNITY)
Admission: EM | Admit: 2019-01-01 | Discharge: 2019-01-01 | Disposition: A | Discharge status: Home / Self Care | Payer: Medicare Other | Attending: Emergency Medicine | Admitting: Emergency Medicine

## 2019-01-01 DIAGNOSIS — Z951 Presence of aortocoronary bypass graft: Secondary | ICD-10-CM | POA: Insufficient documentation

## 2019-01-01 DIAGNOSIS — Z794 Long term (current) use of insulin: Secondary | ICD-10-CM | POA: Insufficient documentation

## 2019-01-01 DIAGNOSIS — I2510 Atherosclerotic heart disease of native coronary artery without angina pectoris: Secondary | ICD-10-CM | POA: Insufficient documentation

## 2019-01-01 DIAGNOSIS — I130 Hypertensive heart and chronic kidney disease with heart failure and stage 1 through stage 4 chronic kidney disease, or unspecified chronic kidney disease: Secondary | ICD-10-CM | POA: Diagnosis not present

## 2019-01-01 DIAGNOSIS — D649 Anemia, unspecified: Secondary | ICD-10-CM | POA: Diagnosis not present

## 2019-01-01 DIAGNOSIS — R0600 Dyspnea, unspecified: Secondary | ICD-10-CM | POA: Insufficient documentation

## 2019-01-01 DIAGNOSIS — R0602 Shortness of breath: Secondary | ICD-10-CM | POA: Diagnosis not present

## 2019-01-01 DIAGNOSIS — Z7982 Long term (current) use of aspirin: Secondary | ICD-10-CM | POA: Insufficient documentation

## 2019-01-01 DIAGNOSIS — K5900 Constipation, unspecified: Secondary | ICD-10-CM | POA: Diagnosis not present

## 2019-01-01 DIAGNOSIS — I5032 Chronic diastolic (congestive) heart failure: Secondary | ICD-10-CM | POA: Insufficient documentation

## 2019-01-01 DIAGNOSIS — I10 Essential (primary) hypertension: Secondary | ICD-10-CM

## 2019-01-01 DIAGNOSIS — Z79899 Other long term (current) drug therapy: Secondary | ICD-10-CM | POA: Insufficient documentation

## 2019-01-01 DIAGNOSIS — N183 Chronic kidney disease, stage 3 (moderate): Secondary | ICD-10-CM | POA: Diagnosis not present

## 2019-01-01 DIAGNOSIS — J45909 Unspecified asthma, uncomplicated: Secondary | ICD-10-CM | POA: Diagnosis not present

## 2019-01-01 DIAGNOSIS — E1122 Type 2 diabetes mellitus with diabetic chronic kidney disease: Secondary | ICD-10-CM | POA: Diagnosis not present

## 2019-01-01 DIAGNOSIS — Z955 Presence of coronary angioplasty implant and graft: Secondary | ICD-10-CM | POA: Insufficient documentation

## 2019-01-01 DIAGNOSIS — Z96651 Presence of right artificial knee joint: Secondary | ICD-10-CM | POA: Diagnosis not present

## 2019-01-01 DIAGNOSIS — R011 Cardiac murmur, unspecified: Secondary | ICD-10-CM | POA: Diagnosis not present

## 2019-01-01 LAB — COMPREHENSIVE METABOLIC PANEL
ALBUMIN: 3.8 g/dL (ref 3.5–5.0)
ALT: 16 U/L (ref 0–44)
ANION GAP: 8 (ref 5–15)
AST: 18 U/L (ref 15–41)
Alkaline Phosphatase: 41 U/L (ref 38–126)
BUN: 31 mg/dL — ABNORMAL HIGH (ref 8–23)
CO2: 23 mmol/L (ref 22–32)
Calcium: 9.9 mg/dL (ref 8.9–10.3)
Chloride: 108 mmol/L (ref 98–111)
Creatinine, Ser: 1.08 mg/dL — ABNORMAL HIGH (ref 0.44–1.00)
GFR calc Af Amer: 54 mL/min — ABNORMAL LOW (ref >60)
GFR calc non Af Amer: 47 mL/min — ABNORMAL LOW (ref >60)
Glucose, Bld: 168 mg/dL — ABNORMAL HIGH (ref 70–99)
Potassium: 4.3 mmol/L (ref 3.5–5.1)
Sodium: 139 mmol/L (ref 135–145)
Total Bilirubin: 0.9 mg/dL (ref 0.3–1.2)
Total Protein: 7.4 g/dL (ref 6.5–8.1)

## 2019-01-01 LAB — CBC
HCT: 37 % (ref 36.0–46.0)
Hemoglobin: 11.5 g/dL — ABNORMAL LOW (ref 12.0–15.0)
MCH: 27.5 pg (ref 26.0–34.0)
MCHC: 31.1 g/dL (ref 30.0–36.0)
MCV: 88.5 fL (ref 80.0–100.0)
Platelets: 176 10*3/uL (ref 150–400)
RBC: 4.18 MIL/uL (ref 3.87–5.11)
RDW: 14.8 % (ref 11.5–15.5)
WBC: 8 10*3/uL (ref 4.0–10.5)
nRBC: 0 % (ref 0.0–0.2)

## 2019-01-01 LAB — BRAIN NATRIURETIC PEPTIDE: B Natriuretic Peptide: 365 pg/mL — ABNORMAL HIGH (ref 0.0–100.0)

## 2019-01-01 LAB — TROPONIN I: Troponin I: <0.03 ng/mL (ref <0.03)

## 2019-01-01 MED ORDER — ALBUTEROL SULFATE (2.5 MG/3ML) 0.083% IN NEBU
5.0000 mg | INHALATION_SOLUTION | Freq: Once | RESPIRATORY_TRACT | Status: AC
  Administered 2019-01-01: 5 mg via RESPIRATORY_TRACT
  Filled 2019-01-01: qty 6

## 2019-01-01 MED ORDER — NITROGLYCERIN 2 % TD OINT
1.0000 [in_us] | TOPICAL_OINTMENT | Freq: Once | TRANSDERMAL | Status: AC
  Administered 2019-01-01: 1 [in_us] via TOPICAL
  Filled 2019-01-01: qty 1

## 2019-01-01 MED ORDER — FUROSEMIDE 10 MG/ML IJ SOLN
60.0000 mg | Freq: Once | INTRAMUSCULAR | Status: AC
  Administered 2019-01-01: 60 mg via INTRAVENOUS
  Filled 2019-01-01: qty 6

## 2019-01-01 NOTE — ED Provider Notes (Signed)
MSE was initiated and I personally evaluated the patient and placed orders (if any) at  6:22 AM on January 01, 2019.  The patient appears stable so that the remainder of the MSE may be completed by another provider.  Patient is here for family who report she has been having trouble breathing off and on for the past 2 weeks.  Her family states she has increased fluid in her lungs.  Yesterday her daughter states her legs were not swollen but they are obviously swollen today.  She has had a dry cough and thinks she has had some wheezing.  She denies chest pain, nausea, vomiting, diarrhea, or fever.  She complains of a lot of constipation.  Family states she was sweaty.  Patient denies feeling like her abdomin was swollen.  Family states he used to have wood heat now they have central air and heat however they are always cold now.  Nurses report her pulse ox in triage was 88% on room air.  Family states she is not on oxygen at home, she was placed on nasal cannula oxygen.  On exam patient appears to have some shortness of breath, when I listen to her lungs she has diminished breath sounds.  When I examine her lower extremities she has pitting edema up to her knees bilaterally.  Laboratory testing was ordered, x-ray was ordered.  She was given Lasix IV, nitroglycerin paste was put on her skin, and she was given an albuterol nebulizer.   EKG Interpretation  Date/Time:  Sunday January 01 2019 06:30:35 EST Ventricular Rate:  64 PR Interval:    QRS Duration: 111 QT Interval:  451 QTC Calculation: 466 R Axis:   54 Text Interpretation:  Sinus rhythm Incomplete left bundle branch block Minimal ST elevation, anterior leads Baseline wander in lead(s) V3 T-wave inversion in chest lateral leads since last tracing 08 Oct 2018 Confirmed by Rolland Porter [phone]) on 01/01/2019 6:46:24 AM         Rolland Porter, MD 01/01/19 [phone]

## 2019-01-01 NOTE — ED Notes (Signed)
Pt ambulated to BR incontinent of urine in brief

## 2019-01-01 NOTE — ED Triage Notes (Signed)
Pt brought in this am with wheezes,coughing, and shortness of breath.

## 2019-01-01 NOTE — ED Notes (Signed)
Pt O2 stayed at 94% while ambulating, no complaints of dizziness or SOB.

## 2019-01-01 NOTE — Discharge Instructions (Signed)
Use your albuterol inhaler 2 puffs every 4 hours as needed for shortness of breath.  Return if needed more than every 4 hours or if concern for any reason.  Call Dr. Verlin Grills office tomorrow to arrange to be seen in the office this week.  Your blood pressure should be rechecked.  Today's was elevated at 172/76

## 2019-01-01 NOTE — ED Provider Notes (Signed)
[facility] EMERGENCY DEPARTMENT Provider Note   CSN: [id number] Arrival date & time: 01/01/19  2595     History   Chief Complaint Chief Complaint  Patient presents with  . Shortness of Breath    HPI [name] is a 85 y.o. female.  Complains of shortness of breath feels like congestive heart failure onset 9:30 PM yesterday worse with lying supine and with walking improved with rest and sitting up associated symptoms include slight cough she is uncertain if she is had a fever.  No other associated symptoms.  No treatment prior to coming here.  Treated with intravenous Lasix,oxygen nitroglycerin paste and albuterol nebulizer prior to my exam with improvement in breathing  HPI  Past Medical History:  Diagnosis Date  . Anemia   . Anxiety   . Aortic stenosis   . Arthritis    back, knees, and hips  . Asthma    with allergies  . Balance problems   . CAD (coronary artery disease)   . Carotid stenosis   . CHF (congestive heart failure) ([place])    EF preserved Echo 2012  . CKD (chronic kidney disease)   . 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 Dementia ([place])    "a little"  . Depression   . Diabetes mellitus    type 2  . GERD (gastroesophageal reflux disease)   . Hearing loss   . Hypertension   . Iron deficiency anemia 05/27/2011  . Myocardial infarction ([place])   . Renal insufficiency   . Scoliosis   . Shortness of breath dyspnea    with activity  . Vertigo    "when sugar gets low"  . Vision loss    left eye-"half blind"    Patient Active Problem List   Diagnosis Date Noted  . Rectal bleeding 11/08/2018  . Rectal pain 11/08/2018  . Abnormal CT scan, colon 11/08/2018  . (HFpEF) heart failure with preserved ejection fraction ([place]) 10/09/2018  . Acute on chronic diastolic CHF (congestive heart failure) ([place]) 10/09/2018  . Aortic stenosis, moderate 10/09/2018  . Mitral valve  stenosis 10/09/2018  . Acute exacerbation of CHF (congestive heart failure) ([place]) 10/07/2018  . Acute hypoxemic respiratory failure ([place]) 10/07/2018  . Chronic anemia 10/07/2018  . Asthma 10/07/2018  . CVA (cerebral vascular accident) ([place]) 10/07/2018  . CAD (coronary artery disease) 10/07/2018  . Constipation 10/07/2018  . Physical deconditioning 10/07/2018  . History of recurrent UTIs 10/07/2018  . CKD (chronic kidney disease) stage 3, GFR 30-59 ml/min (HCC) 12/22/2017  . Dyslipidemia 12/22/2017  . Weakness 11/23/2017  . Overweight (BMI 25.0-29.9) 05/08/2016  . UTI (lower urinary tract infection) 11/12/2015  . DM hyperosmolarity type II, uncontrolled ([place]) 11/12/2015  . Dementia ([place])   . Encephalopathy, metabolic 63/87/5643  . S/P right TKA 08/13/2015  . S/P knee replacement 08/13/2015  . Elevated troponin 03/06/2015  . Fever   . History of diabetes mellitus   . Left buttock pain   . Mixed hyperlipidemia 01/11/2015  . GAD (generalized anxiety disorder) 01/11/2015  . Depression 01/11/2015  . GERD (gastroesophageal reflux disease) 01/11/2015  . Osteopenia 07/13/2014  . Obesity (BMI 30-39.9) 06/19/2014  . Carotid stenosis   . Anemia of chronic disease 12/25/2011  . Iron deficiency anemia 05/27/2011  . Diastolic CHF, chronic ([place]) 04/02/2010  . Type 2 diabetes mellitus with insulin therapy ([place]) 10/08/2009  . HLD (hyperlipidemia) 10/08/2009  . HTN (  hypertension) 10/08/2009  . Coronary atherosclerosis 10/08/2009  . CAROTID STENOSIS 10/08/2009    Past Surgical History:  Procedure Laterality Date  . APPENDECTOMY     with hysterectomy  . CATARACT EXTRACTION Bilateral 5 years ago  . CHOLECYSTECTOMY    . COLONOSCOPY  2005   Dr. Amedeo Plenty: Diverticulosis  . CORONARY ANGIOPLASTY     prior to 2006 5 stents  . CORONARY ARTERY BYPASS GRAFT  2006  . I & D of [place]  April 2013  . KNEE SURGERY Right   . TOTAL ABDOMINAL HYSTERECTOMY  ~85 years old   complete, with tumor removal    . TOTAL KNEE ARTHROPLASTY Right 08/13/2015   Procedure: RIGHT  TOTAL KNEE ARTHROPLASTY;  Surgeon: Paralee Cancel, MD;  Location: [facility] ORS;  Service: Orthopedics;  Laterality: Right;     OB History    Gravida  4   Para  4   Term  4   Preterm      AB      Living  3     SAB      TAB      Ectopic      Multiple      Live Births               Home Medications    Prior to Admission medications   Medication Sig Start Date End Date Taking? Authorizing Provider  albuterol (PROVENTIL HFA;VENTOLIN HFA) 108 (90 Base) MCG/ACT inhaler Inhale 2 puffs into the lungs every 6 (six) hours as needed for wheezing or shortness of breath. 12/14/18  Yes [name], MD  amLODipine (NORVASC) 10 MG tablet Take 1 tablet (10 mg total) by mouth every morning. 10/18/18  Yes StacksCletus Gash, MD  Artificial Tear Ointment (DRY EYES OP) Apply 1 drop to eye daily as needed (dry/red eyes).    Yes [provider]  aspirin 81 MG chewable tablet Chew 81 mg by mouth at bedtime. 08/04/10  Yes [provider]  atorvastatin (LIPITOR) 20 MG tablet Take 1 tablet (20 mg total) by mouth at bedtime. 10/18/18  Yes [name], MD  blood glucose meter kit and supplies KIT Dispense based on insurance coverage. Use 4 times a day as directed Dx: E11.9, Z79.4 06/13/18  Yes Timmothy Euler, MD  budesonide-formoterol (SYMBICORT) 80-4.5 MCG/ACT inhaler Inhale 2 puffs into the lungs 2 (two) times daily. 12/14/18  Yes [name], MD  clopidogrel (PLAVIX) 75 MG tablet Take 1 tablet (75 mg total) by mouth every morning. 10/18/18  Yes [name], MD  esomeprazole (NEXIUM) 40 MG capsule Take 1 capsule (40 mg total) by mouth daily. 11/09/18  Yes [name], MD  ferrous sulfate 324 (65 Fe) MG TBEC Take by mouth daily.   Yes [provider]  FORA LANCETS MISC Test blood sugars twice daily 07/12/18  Yes Stacks, Cletus Gash, MD  furosemide (LASIX) 20 MG tablet Take 1 tablet (20 mg total) by mouth daily. Take  two tablets by mouth in the morning and 1 tablet at lunch 12/14/18  Yes Stacks, Cletus Gash, MD  glucose blood (ACCU-CHEK AVIVA) test strip Use to check blood glucose twice a day 07/12/18  Yes Stacks, Cletus Gash, MD  hydrocortisone (ANUSOL-HC) 25 MG suppository Place 1 suppository (25 mg total) rectally 2 (two) times daily. Patient taking differently: Place 25 mg rectally 2 (two) times daily as needed for hemorrhoids or anal itching.  04/26/18  Yes Terald Sleeper, PA-C  Insulin Detemir (LEVEMIR FLEXTOUCH) 100 UNIT/ML Pen Inject 24 Units  into the skin every morning. 11/09/18  Yes Stacks, Cletus Gash, MD  insulin lispro (HUMALOG KWIKPEN) 100 UNIT/ML KiwkPen Inject 0.04-0.1 mLs (4-10 Units total) into the skin daily as needed (high blood sugar). 07/12/18  Yes [name], MD  Insulin Pen Needle (GLOBAL EASE INJECT PEN NEEDLES) 31G X 8 MM MISC AS DIRECTED 07/12/18  Yes Stacks, Cletus Gash, MD  Insulin Syringe-Needle U-100 (EASY TOUCH INSULIN SYRINGE) 31G X 5/16" 1 ML MISC USE AS DIRECTED. 07/12/18  Yes [name], MD  isosorbide mononitrate (IMDUR) 30 MG 24 hr tablet Take 1 tablet (30 mg total) by mouth daily. 10/18/18  Yes Stacks, Cletus Gash, MD  lisinopril (PRINIVIL,ZESTRIL) 20 MG tablet Take 1 tablet (20 mg total) by mouth every morning. 10/18/18  Yes [name], MD  meclizine (ANTIVERT) 25 MG tablet Take 1 tablet (25 mg total) by mouth 2 (two) times daily. 10/18/18  Yes [name], MD  metFORMIN (GLUCOPHAGE-XR) 500 MG 24 hr tablet Take 1 tablet (500 mg total) by mouth every evening. 10/18/18  Yes Stacks, Cletus Gash, MD  methenamine (HIPREX) 1 g tablet Take 1 tablet (1 g total) by mouth 2 (two) times daily. 12/14/18  Yes [name], MD  metoprolol tartrate (LOPRESSOR) 50 MG tablet Take 1 tablet (50 mg total) by mouth 2 (two) times daily. 10/18/18  Yes Stacks, Cletus Gash, MD  Multiple Vitamins-Iron (DAILY VITAMINS/IRON/BETA CAROT PO) Take 1 tablet by mouth at bedtime.   Yes [provider]  nitroGLYCERIN (NITROSTAT) 0.4  MG SL tablet Place 1 tablet (0.4 mg total) under the tongue every 5 (five) minutes as needed for chest pain. 11/09/18  Yes [name], MD  PARoxetine (PAXIL) 20 MG tablet Take 1 tablet (20 mg total) by mouth every morning. 10/18/18  Yes Stacks, Cletus Gash, MD  polyethylene glycol [facility]) packet Take 17 g by mouth daily. Patient taking differently: Take 17 g by mouth daily as needed for mild constipation or moderate constipation.  07/25/18  Yes Maudie Flakes, MD  spironolactone (ALDACTONE) 25 MG tablet Take 2 every AM 12/14/18  Yes [name], MD    Family History Family History  Problem Relation Age of Onset  . Cancer Brother        porstate  . Early death Sister   . Heart disease Brother   . Heart disease Brother   . Heart attack Brother   . Heart disease Brother   . Heart attack Brother   . Diabetes Sister   . Heart attack Sister   . Diabetes Sister   . Osteoporosis Sister   . Hypertension Sister   . Heart attack Son   . Early death Son   . Pancreatitis Son   . Heart attack Daughter 66    Social History Social History   Tobacco Use  . Smoking status: Never Smoker  . Smokeless tobacco: Never Used  Substance Use Topics  . Alcohol use: No  . Drug use: No     Allergies   Advil [ibuprofen]; Heparin; and Propoxyphene n-acetaminophen   Review of Systems Review of Systems  Constitutional: Negative.   HENT: Positive for hearing loss.        Chronically hard of hearing  Respiratory: Positive for cough and shortness of breath.   Cardiovascular: Negative.   Gastrointestinal: Positive for constipation.  Musculoskeletal: Negative.   Skin: Negative.   Neurological: Negative.   Psychiatric/Behavioral: Negative.   All other systems reviewed and are negative.    Physical Exam Updated Vital Signs BP (!) 158/103 (BP Location: Right  Arm)   Pulse 67   Temp 97.7 F (36.5 C) (Oral)   Resp 19   Ht 5' 6"  (1.676 m)   Wt 67.1 kg   SpO2 98%   BMI 23.89 kg/m    Physical Exam Vitals signs and nursing note reviewed.  Constitutional:      Appearance: She is well-developed.  HENT:     Head: Normocephalic and atraumatic.  Eyes:     Conjunctiva/sclera: Conjunctivae normal.     Pupils: Pupils are equal, round, and reactive to light.  Neck:     Musculoskeletal: Neck supple.     Thyroid: No thyromegaly.     Trachea: No tracheal deviation.  Cardiovascular:     Rate and Rhythm: Normal rate and regular rhythm.     Pulses: Normal pulses.     Heart sounds: Murmur present.     Comments: 3/6 systolic murmur Pulmonary:     Effort: Pulmonary effort is normal.     Breath sounds: Rales present.     Comments: Rales at bases bilaterally Abdominal:     General: Bowel sounds are normal. There is no distension.     Palpations: Abdomen is soft.     Tenderness: There is no abdominal tenderness.  Musculoskeletal: Normal range of motion.        General: Swelling present. No tenderness.     Comments: 2+ pretibial pitting edema bilaterally  Skin:    General: Skin is warm and dry.     Findings: No rash.  Neurological:     Mental Status: She is alert.     Coordination: Coordination normal.      ED Treatments / Results  Labs (all labs ordered are listed, but only abnormal results are displayed) Labs Reviewed  CBC - Abnormal; Notable for the following components:      Result Value   Hemoglobin 11.5 (*)    All other components within normal limits  BRAIN NATRIURETIC PEPTIDE  COMPREHENSIVE METABOLIC PANEL  TROPONIN I    EKG EKG Interpretation  Date/Time:  Sunday January 01 2019 06:30:35 EST Ventricular Rate:  64 PR Interval:    QRS Duration: 111 QT Interval:  451 QTC Calculation: 466 R Axis:   54 Text Interpretation:  Sinus rhythm Incomplete left bundle branch block Minimal ST elevation, anterior leads Baseline wander in lead(s) V3 T-wave inversion in chest lateral leads since last tracing 08 Oct 2018 Confirmed by Rolland Porter [phone]) on  01/01/2019 6:46:24 AM   Radiology No results found.  Procedures Procedures (including critical care time)  Medications Ordered in ED Medications  furosemide (LASIX) injection 60 mg (60 mg Intravenous Given 01/01/19 0658)  albuterol (PROVENTIL) (2.5 MG/3ML) 0.083% nebulizer solution 5 mg (5 mg Nebulization Given 01/01/19 0644)  nitroGLYCERIN (NITROGLYN) 2 % ointment 1 inch (1 inch Topical Given 01/01/19 0658)    Chest x-ray viewed by me Results for orders placed or performed during the hospital encounter of 01/01/19  CBC  Result Value Ref Range   WBC 8.0 4.0 - 10.5 K/uL   RBC 4.18 3.87 - 5.11 MIL/uL   Hemoglobin 11.5 (L) 12.0 - 15.0 g/dL   HCT 37.0 36.0 - 46.0 %   MCV 88.5 80.0 - 100.0 fL   MCH 27.5 26.0 - 34.0 pg   MCHC 31.1 30.0 - 36.0 g/dL   RDW 14.8 11.5 - 15.5 %   Platelets 176 150 - 400 K/uL   nRBC 0.0 0.0 - 0.2 %  Brain natriuretic peptide  Result Value Ref Range  B Natriuretic Peptide 365.0 (H) 0.0 - 100.0 pg/mL  Comprehensive metabolic panel  Result Value Ref Range   Sodium 139 135 - 145 mmol/L   Potassium 4.3 3.5 - 5.1 mmol/L   Chloride 108 98 - 111 mmol/L   CO2 23 22 - 32 mmol/L   Glucose, Bld 168 (H) 70 - 99 mg/dL   BUN 31 (H) 8 - 23 mg/dL   Creatinine, Ser 1.08 (H) 0.44 - 1.00 mg/dL   Calcium 9.9 8.9 - 10.3 mg/dL   Total Protein 7.4 6.5 - 8.1 g/dL   Albumin 3.8 3.5 - 5.0 g/dL   AST 18 15 - 41 U/L   ALT 16 0 - 44 U/L   Alkaline Phosphatase 41 38 - 126 U/L   Total Bilirubin 0.9 0.3 - 1.2 mg/dL   GFR calc non Af Amer 47 (L) >60 mL/min   GFR calc Af Amer 54 (L) >60 mL/min   Anion gap 8 5 - 15  Troponin I - Once  Result Value Ref Range   Troponin I <0.03 <0.03 ng/mL   Dg Chest 2 View  Result Date: 01/01/2019 CLINICAL DATA:  Shortness of breath, wheezing and weakness this morning. EXAM: CHEST - 2 VIEW COMPARISON:  Chest x-rays dated 11/16/2018 and 10/07/2018. FINDINGS: Heart size and mediastinal contours are stable. Coarse lung markings again noted  bilaterally indicating chronic bronchitic changes and/or chronic interstitial lung disease. Patchy opacities at the lung bases are stable, likely superimposed atelectasis or confluent scarring/fibrosis. No pleural effusion or pneumothorax seen. No acute or suspicious osseous finding IMPRESSION: 1. No active cardiopulmonary disease. No evidence of pneumonia or pulmonary edema. 2. Chronic bronchitic changes and/or chronic interstitial lung disease. Electronically Signed   By: Franki Cabot M.D.   On: 01/01/2019 07:31  Chest x-ray viewed by me Initial Impression / Assessment and Plan / ED Course  I have reviewed the triage vital signs and the nursing notes.  Pertinent labs & imaging results that were available during my care of the patient were reviewed by me and considered in my medical decision making (see chart for details).     11:15 AM patient feels her breathing is normal and she feels well to go home.  Feel that symptoms are consistent with mild exacerbation of congestive heart failure as well as bronchitis.  Plan she is to use albuterol inhaler 2 puffs every 4 hours as needed for shortness of breath.  Return if needed more than every 4 hours.  She is instructed to call her PCP to be seen this week for reevaluation.  Blood pressure recheck within 1 week. Lab work remarkable for mild anemia which is chronic.  Mild renal insufficiency which is also chronic. Final Clinical Impressions(s) / ED Diagnoses  Diagnoses #1 dyspnea #2 elevated blood pressure #3 anemia #4 chronic renal insufficiency Final diagnoses:  None    ED Discharge Orders    None       Orlie Dakin, MD 01/01/19 1124

## 2019-01-01 NOTE — ED Notes (Signed)
Pt returned from X-ray.  

## 2019-01-03 ENCOUNTER — Encounter: Payer: Self-pay | Admitting: Family Medicine

## 2019-01-03 ENCOUNTER — Ambulatory Visit (INDEPENDENT_AMBULATORY_CARE_PROVIDER_SITE_OTHER): Payer: Medicare Other | Admitting: Family Medicine

## 2019-01-03 VITALS — BP 154/77 | HR 80 | Temp 99.4°F | Ht 66.0 in | Wt 151.0 lb

## 2019-01-03 DIAGNOSIS — I5033 Acute on chronic diastolic (congestive) heart failure: Secondary | ICD-10-CM | POA: Diagnosis not present

## 2019-01-03 DIAGNOSIS — F411 Generalized anxiety disorder: Secondary | ICD-10-CM

## 2019-01-03 DIAGNOSIS — I10 Essential (primary) hypertension: Secondary | ICD-10-CM

## 2019-01-03 MED ORDER — PAROXETINE HCL 20 MG PO TABS: 20.0000 mg | ORAL_TABLET | Freq: Every morning | ORAL | 1 refills | Status: DC

## 2019-01-03 MED ORDER — METOPROLOL TARTRATE 50 MG PO TABS: 50.0000 mg | ORAL_TABLET | Freq: Two times a day (BID) | ORAL | 2 refills | Status: DC

## 2019-01-04 ENCOUNTER — Ambulatory Visit: Payer: Medicare Other | Admitting: Cardiology

## 2019-01-04 LAB — CMP14+EGFR
ALT: 10 IU/L (ref 0–32)
AST: 11 IU/L (ref 0–40)
Albumin/Globulin Ratio: 1.8 (ref 1.2–2.2)
Albumin: 3.7 g/dL (ref 3.6–4.6)
Alkaline Phosphatase: 44 IU/L (ref 39–117)
BUN/Creatinine Ratio: 19 (ref 12–28)
BUN: 26 mg/dL (ref 8–27)
Bilirubin Total: <0.2 mg/dL (ref 0.0–1.2)
CO2: 22 mmol/L (ref 20–29)
Calcium: 9.3 mg/dL (ref 8.7–10.3)
Chloride: 104 mmol/L (ref 96–106)
Creatinine, Ser: 1.37 mg/dL — ABNORMAL HIGH (ref 0.57–1.00)
GFR calc Af Amer: 41 mL/min/{1.73_m2} — ABNORMAL LOW (ref >59)
GFR calc non Af Amer: 35 mL/min/{1.73_m2} — ABNORMAL LOW (ref >59)
Globulin, Total: 2.1 g/dL (ref 1.5–4.5)
Glucose: 253 mg/dL — ABNORMAL HIGH (ref 65–99)
POTASSIUM: 4.3 mmol/L (ref 3.5–5.2)
Sodium: 141 mmol/L (ref 134–144)
Total Protein: 5.8 g/dL — ABNORMAL LOW (ref 6.0–8.5)

## 2019-01-06 LAB — BRAIN NATRIURETIC PEPTIDE: BNP: 184.8 pg/mL — ABNORMAL HIGH (ref 0.0–100.0)

## 2019-01-10 ENCOUNTER — Encounter: Payer: Self-pay | Admitting: Family Medicine

## 2019-01-10 NOTE — Progress Notes (Signed)
Subjective:  [name], female    DOB: [birth year]  Age: 85 y.o. MRN: [id number]  CC: ER follow up   HPI [name] presents for recheck of CHF from E.D. No longer dyspneic or swollen, but having a lot of snoring, Concerned for sleep apnea. Also continues to have anxiety. Reports excessive nervousness, anxiousness and worry. She has restlessness and is easily annoyed.    Follow-up of hypertension. Patient has no history of headache chest pain or shortness of breath or recent cough. Patient also denies symptoms of TIA such as numbness weakness lateralizing. Patient checks  blood pressure at home and has not had any elevated readings recently. Patient denies side effects from his medication. States taking it regularly.   Depression screen [facility] 2/9 01/03/2019 12/14/2018 11/23/2018  Decreased Interest 1 1 0  Down, Depressed, Hopeless 1 1 0  PHQ - 2 Score 2 2 0  Altered sleeping 1 1 -  Tired, decreased energy 1 1 -  Change in appetite 0 0 -  Feeling bad or failure about yourself  0 0 -  Trouble concentrating 0 0 -  Moving slowly or fidgety/restless 0 0 -  Suicidal thoughts 0 0 -  PHQ-9 Score 4 4 -  Difficult doing work/chores - - -  Some recent data might be hidden    History [name] has a past medical history of Anemia, Anxiety, Aortic stenosis, Arthritis, Asthma, Balance problems, CAD (coronary artery disease), Carotid stenosis, CHF (congestive heart failure) ([place]), CKD (chronic kidney disease), CVA (cerebral infarction), Dementia ([place]), Depression, Diabetes mellitus, GERD (gastroesophageal reflux disease), Hearing loss, Hypertension, Iron deficiency anemia (05/27/2011), Myocardial infarction [facility]), Renal insufficiency, Scoliosis, Shortness of breath dyspnea, Vertigo, and Vision loss.   She has a past surgical history that includes I & D of [place] (April 2013); Coronary artery bypass graft (2006); Coronary angioplasty; Total abdominal hysterectomy (~85 years old);  Cholecystectomy; Appendectomy; Cataract extraction (Bilateral, 5 years ago); Total knee arthroplasty (Right, 08/13/2015); Knee surgery (Right); and Colonoscopy (2005).   Her family history includes Cancer in her brother; Diabetes in her sister and sister; Early death in her sister and son; Heart attack in her brother, brother, sister, and son; Heart attack (age of onset: 47) in her daughter; Heart disease in her brother, brother, and brother; Hypertension in her sister; Osteoporosis in her sister; Pancreatitis in her son.She reports that she has never smoked. She has never used smokeless tobacco. She reports that she does not drink alcohol or use drugs.    ROS Review of Systems  Constitutional: Negative.   HENT: Negative.   Eyes: Negative for visual disturbance.  Respiratory: Negative for shortness of breath.   Cardiovascular: Negative for chest pain.  Gastrointestinal: Negative for abdominal pain.  Musculoskeletal: Negative for arthralgias.    Objective:  BP (!) 154/77   Pulse 80   Temp 99.4 F (37.4 C) (Oral)   Ht 5' 6"  (1.676 m)   Wt 151 lb (68.5 kg)   BMI 24.37 kg/m   BP Readings from Last 3 Encounters:  01/03/19 (!) 154/77  01/01/19 (!) 147/99  12/14/18 (!) 174/76    Wt Readings from Last 3 Encounters:  01/03/19 151 lb (68.5 kg)  01/01/19 148 lb (67.1 kg)  12/14/18 147 lb 2 oz (66.7 kg)     Physical Exam Constitutional:      General: She is not in acute distress.    Appearance: She is well-developed.  HENT:     Head: Normocephalic and  atraumatic.     Right Ear: External ear normal.     Left Ear: External ear normal.     Nose: Nose normal.  Eyes:     Conjunctiva/sclera: Conjunctivae normal.     Pupils: Pupils are equal, round, and reactive to light.  Neck:     Musculoskeletal: Normal range of motion and neck supple.     Thyroid: No thyromegaly.  Cardiovascular:     Rate and Rhythm: Normal rate and regular rhythm.     Heart sounds: Normal heart sounds. No  murmur.  Pulmonary:     Effort: Pulmonary effort is normal. No respiratory distress.     Breath sounds: Normal breath sounds. No wheezing or rales.  Abdominal:     General: Bowel sounds are normal. There is no distension.     Palpations: Abdomen is soft.     Tenderness: There is no abdominal tenderness.  Lymphadenopathy:     Cervical: No cervical adenopathy.  Skin:    General: Skin is warm and dry.  Neurological:     Mental Status: She is alert and oriented to person, place, and time.     Deep Tendon Reflexes: Reflexes are normal and symmetric.  Psychiatric:        Behavior: Behavior normal.        Thought Content: Thought content normal.        Judgment: Judgment normal.       Assessment & Plan:   [name] was seen today for er follow up.  Diagnoses and all orders for this visit:  Acute on chronic diastolic congestive heart failure (HCC) -     Brain natriuretic peptide -     CMP14+EGFR  GAD (generalized anxiety disorder) -     PARoxetine (PAXIL) 20 MG tablet; Take 1 tablet (20 mg total) by mouth every morning.  Essential hypertension -     metoprolol tartrate (LOPRESSOR) 50 MG tablet; Take 1 tablet (50 mg total) by mouth 2 (two) times daily.    I am having [name] maintain her Artificial Tear Ointment (DRY EYES OP), Multiple Vitamins-Iron (DAILY VITAMINS/IRON/BETA CAROT PO), aspirin, hydrocortisone, blood glucose meter kit and supplies, FORA LANCETS, glucose blood, insulin lispro, Insulin Pen Needle, Insulin Syringe-Needle U-100, polyethylene glycol, metFORMIN, lisinopril, isosorbide mononitrate, clopidogrel, atorvastatin, amLODipine, esomeprazole, Insulin Detemir, ferrous sulfate, albuterol, budesonide-formoterol, furosemide, methenamine, spironolactone, meclizine, nitroGLYCERIN, PARoxetine, and metoprolol tartrate.  Allergies as of 01/03/2019      Reactions   Advil [ibuprofen] Swelling   Heparin Other (See Comments)   Confusion, hallucinations.    Propoxyphene  N-acetaminophen Other (See Comments)   Numbness all over. "floating" sensation.      Medication List       Accurate as of January 03, 2019 11:59 PM. Always use your most recent med list.        albuterol 108 (90 Base) MCG/ACT inhaler Commonly known as:  PROVENTIL HFA;VENTOLIN HFA Inhale 2 puffs into the lungs every 6 (six) hours as needed for wheezing or shortness of breath.   amLODipine 10 MG tablet Commonly known as:  NORVASC Take 1 tablet (10 mg total) by mouth every morning.   aspirin 81 MG chewable tablet Chew 81 mg by mouth at bedtime.   atorvastatin 20 MG tablet Commonly known as:  LIPITOR Take 1 tablet (20 mg total) by mouth at bedtime.   blood glucose meter kit and supplies Kit Dispense based on insurance coverage. Use 4 times a day as directed Dx: E11.9, Z79.4   budesonide-formoterol  80-4.5 MCG/ACT inhaler Commonly known as:  SYMBICORT Inhale 2 puffs into the lungs 2 (two) times daily.   clopidogrel 75 MG tablet Commonly known as:  PLAVIX Take 1 tablet (75 mg total) by mouth every morning.   DAILY VITAMINS/IRON/BETA CAROT PO Take 1 tablet by mouth at bedtime.   DRY EYES OP Apply 1 drop to eye daily as needed (dry/red eyes).   esomeprazole 40 MG capsule Commonly known as:  NEXIUM Take 1 capsule (40 mg total) by mouth daily.   ferrous sulfate 324 (65 Fe) MG Tbec Take by mouth daily.   FORA LANCETS Misc Test blood sugars twice daily   furosemide 20 MG tablet Commonly known as:  LASIX Take 1 tablet (20 mg total) by mouth daily. Take two tablets by mouth in the morning and 1 tablet at lunch   glucose blood test strip Commonly known as:  ACCU-CHEK AVIVA Use to check blood glucose twice a day   hydrocortisone 25 MG suppository Commonly known as:  ANUSOL-HC Place 1 suppository (25 mg total) rectally 2 (two) times daily.   Insulin Detemir 100 UNIT/ML Pen Commonly known as:  LEVEMIR FLEXTOUCH Inject 24 Units into the skin every morning.   insulin  lispro 100 UNIT/ML KiwkPen Commonly known as:  HUMALOG KWIKPEN Inject 0.04-0.1 mLs (4-10 Units total) into the skin daily as needed (high blood sugar).   Insulin Pen Needle 31G X 8 MM Misc Commonly known as:  GLOBAL EASE INJECT PEN NEEDLES AS DIRECTED   Insulin Syringe-Needle U-100 31G X 5/16" 1 ML Misc Commonly known as:  EASY TOUCH INSULIN SYRINGE USE AS DIRECTED.   isosorbide mononitrate 30 MG 24 hr tablet Commonly known as:  IMDUR Take 1 tablet (30 mg total) by mouth daily.   lisinopril 20 MG tablet Commonly known as:  PRINIVIL,ZESTRIL Take 1 tablet (20 mg total) by mouth every morning.   meclizine 25 MG tablet Commonly known as:  ANTIVERT TAKE 1 TABLET TWICE DAILY AS NEEDED FOR DIZZINESS.   metFORMIN 500 MG 24 hr tablet Commonly known as:  GLUCOPHAGE-XR Take 1 tablet (500 mg total) by mouth every evening.   methenamine 1 g tablet Commonly known as:  HIPREX Take 1 tablet (1 g total) by mouth 2 (two) times daily.   metoprolol tartrate 50 MG tablet Commonly known as:  LOPRESSOR Take 1 tablet (50 mg total) by mouth 2 (two) times daily.   nitroGLYCERIN 0.4 MG SL tablet Commonly known as:  NITROSTAT PLACE ONE (1) TABLET UNDER TONGUE EVERY 5 MINUTES UP TO (3) DOSES AS NEEDED FOR CHEST PAIN.   PARoxetine 20 MG tablet Commonly known as:  PAXIL Take 1 tablet (20 mg total) by mouth every morning.   polyethylene glycol packet Commonly known as:  MIRALAX Take 17 g by mouth daily.   spironolactone 25 MG tablet Commonly known as:  ALDACTONE Take 2 every AM        Follow-up: No follow-ups on file.  [name], M.D.

## 2019-01-16 ENCOUNTER — Ambulatory Visit: Payer: Medicare Other | Admitting: Family Medicine

## 2019-01-20 ENCOUNTER — Other Ambulatory Visit: Payer: Self-pay

## 2019-01-20 ENCOUNTER — Emergency Department (HOSPITAL_COMMUNITY): Payer: Medicare Other

## 2019-01-20 ENCOUNTER — Emergency Department (HOSPITAL_COMMUNITY)
Admission: EM | Admit: 2019-01-20 | Discharge: 2019-01-20 | Disposition: A | Discharge status: Home / Self Care | Payer: Medicare Other | Attending: Emergency Medicine | Admitting: Emergency Medicine

## 2019-01-20 ENCOUNTER — Encounter (HOSPITAL_COMMUNITY): Payer: Self-pay | Admitting: Emergency Medicine

## 2019-01-20 ENCOUNTER — Ambulatory Visit (INDEPENDENT_AMBULATORY_CARE_PROVIDER_SITE_OTHER): Payer: Medicare Other | Admitting: Family Medicine

## 2019-01-20 ENCOUNTER — Telehealth: Payer: Self-pay | Admitting: Family Medicine

## 2019-01-20 ENCOUNTER — Encounter: Payer: Self-pay | Admitting: Family Medicine

## 2019-01-20 VITALS — BP 209/90 | HR 59 | Temp 96.8°F | Ht 66.0 in | Wt 144.2 lb

## 2019-01-20 DIAGNOSIS — K219 Gastro-esophageal reflux disease without esophagitis: Secondary | ICD-10-CM

## 2019-01-20 DIAGNOSIS — N183 Chronic kidney disease, stage 3 (moderate): Secondary | ICD-10-CM | POA: Diagnosis not present

## 2019-01-20 DIAGNOSIS — E119 Type 2 diabetes mellitus without complications: Secondary | ICD-10-CM | POA: Diagnosis not present

## 2019-01-20 DIAGNOSIS — I10 Essential (primary) hypertension: Secondary | ICD-10-CM

## 2019-01-20 DIAGNOSIS — Z7902 Long term (current) use of antithrombotics/antiplatelets: Secondary | ICD-10-CM | POA: Diagnosis not present

## 2019-01-20 DIAGNOSIS — K5900 Constipation, unspecified: Secondary | ICD-10-CM

## 2019-01-20 DIAGNOSIS — I5032 Chronic diastolic (congestive) heart failure: Secondary | ICD-10-CM | POA: Diagnosis not present

## 2019-01-20 DIAGNOSIS — E1122 Type 2 diabetes mellitus with diabetic chronic kidney disease: Secondary | ICD-10-CM | POA: Diagnosis not present

## 2019-01-20 DIAGNOSIS — Z794 Long term (current) use of insulin: Secondary | ICD-10-CM

## 2019-01-20 DIAGNOSIS — R600 Localized edema: Secondary | ICD-10-CM | POA: Insufficient documentation

## 2019-01-20 DIAGNOSIS — I130 Hypertensive heart and chronic kidney disease with heart failure and stage 1 through stage 4 chronic kidney disease, or unspecified chronic kidney disease: Secondary | ICD-10-CM | POA: Insufficient documentation

## 2019-01-20 DIAGNOSIS — Z951 Presence of aortocoronary bypass graft: Secondary | ICD-10-CM | POA: Diagnosis not present

## 2019-01-20 DIAGNOSIS — K649 Unspecified hemorrhoids: Secondary | ICD-10-CM

## 2019-01-20 DIAGNOSIS — J45909 Unspecified asthma, uncomplicated: Secondary | ICD-10-CM | POA: Insufficient documentation

## 2019-01-20 DIAGNOSIS — I2510 Atherosclerotic heart disease of native coronary artery without angina pectoris: Secondary | ICD-10-CM | POA: Insufficient documentation

## 2019-01-20 DIAGNOSIS — Z96651 Presence of right artificial knee joint: Secondary | ICD-10-CM | POA: Insufficient documentation

## 2019-01-20 DIAGNOSIS — J40 Bronchitis, not specified as acute or chronic: Secondary | ICD-10-CM

## 2019-01-20 DIAGNOSIS — F0390 Unspecified dementia without behavioral disturbance: Secondary | ICD-10-CM | POA: Insufficient documentation

## 2019-01-20 DIAGNOSIS — Z7982 Long term (current) use of aspirin: Secondary | ICD-10-CM | POA: Insufficient documentation

## 2019-01-20 LAB — CBC WITH DIFFERENTIAL/PLATELET
Abs Immature Granulocytes: 0.01 10*3/uL (ref 0.00–0.07)
Basophils Absolute: 0 10*3/uL (ref 0.0–0.1)
Basophils Relative: 1 %
Eosinophils Absolute: 0.1 10*3/uL (ref 0.0–0.5)
Eosinophils Relative: 3 %
HCT: 32.4 % — ABNORMAL LOW (ref 36.0–46.0)
HEMOGLOBIN: 9.9 g/dL — AB (ref 12.0–15.0)
Immature Granulocytes: 0 %
LYMPHS ABS: 1 10*3/uL (ref 0.7–4.0)
Lymphocytes Relative: 22 %
MCH: 27.2 pg (ref 26.0–34.0)
MCHC: 30.6 g/dL (ref 30.0–36.0)
MCV: 89 fL (ref 80.0–100.0)
Monocytes Absolute: 0.4 10*3/uL (ref 0.1–1.0)
Monocytes Relative: 9 %
Neutro Abs: 2.9 10*3/uL (ref 1.7–7.7)
Neutrophils Relative %: 65 %
Platelets: 148 10*3/uL — ABNORMAL LOW (ref 150–400)
RBC: 3.64 MIL/uL — ABNORMAL LOW (ref 3.87–5.11)
RDW: 15 % (ref 11.5–15.5)
WBC: 4.5 10*3/uL (ref 4.0–10.5)
nRBC: 0 % (ref 0.0–0.2)

## 2019-01-20 LAB — COMPREHENSIVE METABOLIC PANEL
ALT: 10 U/L (ref 0–44)
AST: 13 U/L — ABNORMAL LOW (ref 15–41)
Albumin: 3.2 g/dL — ABNORMAL LOW (ref 3.5–5.0)
Alkaline Phosphatase: 37 U/L — ABNORMAL LOW (ref 38–126)
Anion gap: 6 (ref 5–15)
BUN: 27 mg/dL — ABNORMAL HIGH (ref 8–23)
CO2: 23 mmol/L (ref 22–32)
Calcium: 9 mg/dL (ref 8.9–10.3)
Chloride: 108 mmol/L (ref 98–111)
Creatinine, Ser: 1.15 mg/dL — ABNORMAL HIGH (ref 0.44–1.00)
GFR calc Af Amer: 50 mL/min — ABNORMAL LOW (ref >60)
GFR calc non Af Amer: 43 mL/min — ABNORMAL LOW (ref >60)
Glucose, Bld: 280 mg/dL — ABNORMAL HIGH (ref 70–99)
Potassium: 4.2 mmol/L (ref 3.5–5.1)
Sodium: 137 mmol/L (ref 135–145)
Total Bilirubin: 0.3 mg/dL (ref 0.3–1.2)
Total Protein: 6.1 g/dL — ABNORMAL LOW (ref 6.5–8.1)

## 2019-01-20 LAB — LIPASE, BLOOD: Lipase: 29 U/L (ref 11–51)

## 2019-01-20 LAB — BAYER DCA HB A1C WAIVED: HB A1C (BAYER DCA - WAIVED): 6.8 % (ref <7.0)

## 2019-01-20 MED ORDER — BUDESONIDE-FORMOTEROL FUMARATE 80-4.5 MCG/ACT IN AERO: 2.0000 | INHALATION_SPRAY | Freq: Two times a day (BID) | RESPIRATORY_TRACT | 3 refills | Status: DC

## 2019-01-20 MED ORDER — ALBUTEROL SULFATE HFA 108 (90 BASE) MCG/ACT IN AERS: 2.0000 | INHALATION_SPRAY | Freq: Four times a day (QID) | RESPIRATORY_TRACT | 5 refills | Status: AC | PRN

## 2019-01-20 MED ORDER — HYDROCORTISONE ACETATE 25 MG RE SUPP: 25.0000 mg | Freq: Two times a day (BID) | RECTAL | 5 refills | Status: DC

## 2019-01-20 MED ORDER — HYDRALAZINE HCL 50 MG PO TABS: 50.0000 mg | ORAL_TABLET | Freq: Three times a day (TID) | ORAL | 2 refills | Status: DC

## 2019-01-20 MED ORDER — INSULIN DETEMIR 100 UNIT/ML FLEXPEN: 24.0000 [IU] | PEN_INJECTOR | Freq: Every morning | SUBCUTANEOUS | 0 refills | Status: DC

## 2019-01-20 MED ORDER — FORA LANCETS MISC: 3 refills | Status: DC

## 2019-01-20 MED ORDER — HYDRALAZINE HCL 25 MG PO TABS
25.0000 mg | ORAL_TABLET | Freq: Once | ORAL | Status: AC
  Administered 2019-01-20: 25 mg via ORAL
  Filled 2019-01-20: qty 1

## 2019-01-20 MED ORDER — "INSULIN SYRINGE-NEEDLE U-100 31G X 5/16"" 1 ML MISC": 2 refills | Status: DC

## 2019-01-20 MED ORDER — INSULIN PEN NEEDLE 31G X 8 MM MISC: 2 refills | Status: DC

## 2019-01-20 MED ORDER — ESOMEPRAZOLE MAGNESIUM 40 MG PO CPDR: 40.0000 mg | DELAYED_RELEASE_CAPSULE | Freq: Every day | ORAL | 1 refills | Status: DC

## 2019-01-20 MED ORDER — GLUCOSE BLOOD VI STRP: ORAL_STRIP | 3 refills | Status: DC

## 2019-01-20 NOTE — Discharge Instructions (Signed)
Continue to take MiraLAX as needed for constipation.  Fill blood pressure prescription medications and take as instructed.  Follow-up closely with your primary physician.

## 2019-01-20 NOTE — Progress Notes (Signed)
Subjective:  [name], female    DOB: 1931/06/25  Age: 85 y.o. MRN: [id number]  CC: Medical Management of Chronic Issues   HPI [name] presents for  follow-up of hypertension. Patient has no history of headache chest pain or shortness of breath or recent cough. Patient also denies symptoms of TIA such as focal numbness or weakness. Patient denies side effects from medication. Her daughter [name] puts her pills in a dispenser for her every Sunday, but sometimes she gets confused and misses them. [name] isn't sure if she is getting metoprolol. Confirms the rest. Having colonoscopy on March 11.  History [name] has a past medical history of Anemia, Anxiety, Aortic stenosis, Arthritis, Asthma, Balance problems, CAD (coronary artery disease), Carotid stenosis, CHF (congestive heart failure) ([place]), CKD (chronic kidney disease), CVA (cerebral infarction), Dementia ([place]), Depression, Diabetes mellitus, GERD (gastroesophageal reflux disease), Hearing loss, Hypertension, Iron deficiency anemia (05/27/2011), Myocardial infarction [facility]), Renal insufficiency, Scoliosis, Shortness of breath dyspnea, Vertigo, and Vision loss.   She has a past surgical history that includes I & D of [place] (April 2013); Coronary artery bypass graft (2006); Coronary angioplasty; Total abdominal hysterectomy (~85 years old); Cholecystectomy; Appendectomy; Cataract extraction (Bilateral, 5 years ago); Total knee arthroplasty (Right, 08/13/2015); Knee surgery (Right); and Colonoscopy (2005).   Her family history includes Cancer in her brother; Diabetes in her sister and sister; Early death in her sister and son; Heart attack in her brother, brother, sister, and son; Heart attack (age of onset: 47) in her daughter; Heart disease in her brother, brother, and brother; Hypertension in her sister; Osteoporosis in her sister; Pancreatitis in her son.She reports that she has never smoked. She has never used smokeless  tobacco. She reports that she does not drink alcohol or use drugs.  Current Outpatient Medications on File Prior to Visit  Medication Sig Dispense Refill  . amLODipine (NORVASC) 10 MG tablet Take 1 tablet (10 mg total) by mouth every morning. 90 tablet 2  . Artificial Tear Ointment (DRY EYES OP) Apply 1 drop to eye daily as needed (dry/red eyes).     Marland Kitchen aspirin 81 MG chewable tablet Chew 81 mg by mouth at bedtime.    Marland Kitchen atorvastatin (LIPITOR) 20 MG tablet Take 1 tablet (20 mg total) by mouth at bedtime. 90 tablet 3  . blood glucose meter kit and supplies KIT Dispense based on insurance coverage. Use 4 times a day as directed Dx: E11.9, Z79.4 1 each 0  . clopidogrel (PLAVIX) 75 MG tablet Take 1 tablet (75 mg total) by mouth every morning. 90 tablet 2  . ferrous sulfate 324 (65 Fe) MG TBEC Take by mouth daily.    . furosemide (LASIX) 20 MG tablet Take 1 tablet (20 mg total) by mouth daily. Take two tablets by mouth in the morning and 1 tablet at lunch 30 tablet 2  . insulin lispro (HUMALOG KWIKPEN) 100 UNIT/ML KiwkPen Inject 0.04-0.1 mLs (4-10 Units total) into the skin daily as needed (high blood sugar). 15 mL 5  . isosorbide mononitrate (IMDUR) 30 MG 24 hr tablet Take 1 tablet (30 mg total) by mouth daily. 90 tablet 3  . lisinopril (PRINIVIL,ZESTRIL) 20 MG tablet Take 1 tablet (20 mg total) by mouth every morning. 90 tablet 2  . meclizine (ANTIVERT) 25 MG tablet TAKE 1 TABLET TWICE DAILY AS NEEDED FOR DIZZINESS. 60 tablet 2  . metFORMIN (GLUCOPHAGE-XR) 500 MG 24 hr tablet Take 1 tablet (500 mg total) by mouth every evening.  90 tablet 2  . methenamine (HIPREX) 1 g tablet Take 1 tablet (1 g total) by mouth 2 (two) times daily. 60 tablet 5  . metoprolol tartrate (LOPRESSOR) 50 MG tablet Take 1 tablet (50 mg total) by mouth 2 (two) times daily. 180 tablet 2  . Multiple Vitamins-Iron (DAILY VITAMINS/IRON/BETA CAROT PO) Take 1 tablet by mouth at bedtime.    . nitroGLYCERIN (NITROSTAT) 0.4 MG SL tablet  PLACE ONE (1) TABLET UNDER TONGUE EVERY 5 MINUTES UP TO (3) DOSES AS NEEDED FOR CHEST PAIN. 25 tablet 2  . PARoxetine (PAXIL) 20 MG tablet Take 1 tablet (20 mg total) by mouth every morning. 90 tablet 1  . polyethylene glycol (MIRALAX) packet Take 17 g by mouth daily. (Patient taking differently: Take 17 g by mouth daily as needed for mild constipation or moderate constipation. ) 30 each 0  . spironolactone (ALDACTONE) 25 MG tablet Take 2 every AM 30 tablet 0   No current facility-administered medications on file prior to visit.     ROS Review of Systems  Constitutional: Negative.   HENT: Negative for congestion.   Eyes: Negative for visual disturbance.  Respiratory: Negative for shortness of breath.   Cardiovascular: Negative for chest pain.  Gastrointestinal: Negative for abdominal pain, constipation, diarrhea, nausea and vomiting.  Genitourinary: Negative for difficulty urinating.  Musculoskeletal: Negative for arthralgias and myalgias.  Neurological: Negative for headaches.  Psychiatric/Behavioral: Negative for agitation and sleep disturbance.    Objective:  BP (!) 209/90   Pulse (!) 59   Temp (!) 96.8 F (36 C) (Oral)   Ht 5' 6"  (1.676 m)   Wt 144 lb 4 oz (65.4 kg)   BMI 23.28 kg/m   BP Readings from Last 3 Encounters:  01/20/19 (!) 209/90  01/03/19 (!) 154/77  01/01/19 (!) 147/99    Wt Readings from Last 3 Encounters:  01/20/19 144 lb 4 oz (65.4 kg)  01/03/19 151 lb (68.5 kg)  01/01/19 148 lb (67.1 kg)     Physical Exam Constitutional:      General: She is not in acute distress.    Appearance: She is well-developed.  HENT:     Head: Normocephalic and atraumatic.  Eyes:     Conjunctiva/sclera: Conjunctivae normal.     Pupils: Pupils are equal, round, and reactive to light.  Neck:     Musculoskeletal: Normal range of motion and neck supple.     Thyroid: No thyromegaly.  Cardiovascular:     Rate and Rhythm: Normal rate and regular rhythm.     Heart  sounds: Normal heart sounds. No murmur.  Pulmonary:     Effort: Pulmonary effort is normal. No respiratory distress.     Breath sounds: Normal breath sounds. No wheezing or rales.  Abdominal:     General: Bowel sounds are normal. There is no distension.     Palpations: Abdomen is soft.     Tenderness: There is no abdominal tenderness.  Musculoskeletal: Normal range of motion.  Lymphadenopathy:     Cervical: No cervical adenopathy.  Skin:    General: Skin is warm and dry.  Neurological:     Mental Status: She is alert and oriented to person, place, and time.     Gait: Gait abnormal.     Comments: Speech impaired. Tardive dyskinesia posssibly.  Psychiatric:        Attention and Perception: Attention normal.        Mood and Affect: Mood normal.  Speech: Speech is slurred.        Behavior: Behavior normal.        Cognition and Memory: Cognition is impaired. Memory is impaired.       Assessment & Plan:   Shelsea was seen today for medical management of chronic issues.  Diagnoses and all orders for this visit:  Accelerated hypertension  Bronchitis -     albuterol (PROVENTIL HFA;VENTOLIN HFA) 108 (90 Base) MCG/ACT inhaler; Inhale 2 puffs into the lungs every 6 (six) hours as needed for wheezing or shortness of breath. -     budesonide-formoterol (SYMBICORT) 80-4.5 MCG/ACT inhaler; Inhale 2 puffs into the lungs 2 (two) times daily.  Gastroesophageal reflux disease, esophagitis presence not specified -     esomeprazole (NEXIUM) 40 MG capsule; Take 1 capsule (40 mg total) by mouth daily.  Hemorrhoids, unspecified hemorrhoid type -     hydrocortisone (ANUSOL-HC) 25 MG suppository; Place 1 suppository (25 mg total) rectally 2 (two) times daily.  Type 2 diabetes mellitus with insulin therapy ([place])  Other orders -     FORA LANCETS [place]; Test blood sugars twice daily -     glucose blood (ACCU-CHEK AVIVA) test strip; Use to check blood glucose twice a day -     Insulin Detemir  (LEVEMIR FLEXTOUCH) 100 UNIT/ML Pen; Inject 24 Units into the skin every morning. -     Insulin Pen Needle (GLOBAL EASE INJECT PEN NEEDLES) 31G X 8 MM MISC; AS DIRECTED -     Insulin Syringe-Needle U-100 (EASY TOUCH INSULIN SYRINGE) 31G X 5/16" 1 ML MISC; USE AS DIRECTED. -     hydrALAZINE (APRESOLINE) 50 MG tablet; Take 1 tablet (50 mg total) by mouth 3 (three) times daily.   Allergies as of 01/20/2019      Reactions   Advil [ibuprofen] Swelling   Heparin Other (See Comments)   Confusion, hallucinations.    Propoxyphene N-acetaminophen Other (See Comments)   Numbness all over. "floating" sensation.      Medication List       Accurate as of January 20, 2019 11:05 AM. Always use your most recent med list.        albuterol 108 (90 Base) MCG/ACT inhaler Commonly known as:  PROVENTIL HFA;VENTOLIN HFA Inhale 2 puffs into the lungs every 6 (six) hours as needed for wheezing or shortness of breath.   amLODipine 10 MG tablet Commonly known as:  NORVASC Take 1 tablet (10 mg total) by mouth every morning.   aspirin 81 MG chewable tablet Chew 81 mg by mouth at bedtime.   atorvastatin 20 MG tablet Commonly known as:  LIPITOR Take 1 tablet (20 mg total) by mouth at bedtime.   blood glucose meter kit and supplies Kit Dispense based on insurance coverage. Use 4 times a day as directed Dx: E11.9, Z79.4   budesonide-formoterol 80-4.5 MCG/ACT inhaler Commonly known as:  SYMBICORT Inhale 2 puffs into the lungs 2 (two) times daily.   clopidogrel 75 MG tablet Commonly known as:  PLAVIX Take 1 tablet (75 mg total) by mouth every morning.   DAILY VITAMINS/IRON/BETA CAROT PO Take 1 tablet by mouth at bedtime.   DRY EYES OP Apply 1 drop to eye daily as needed (dry/red eyes).   esomeprazole 40 MG capsule Commonly known as:  NEXIUM Take 1 capsule (40 mg total) by mouth daily.   ferrous sulfate 324 (65 Fe) MG Tbec Take by mouth daily.   FORA LANCETS Misc Test blood sugars twice  daily  furosemide 20 MG tablet Commonly known as:  LASIX Take 1 tablet (20 mg total) by mouth daily. Take two tablets by mouth in the morning and 1 tablet at lunch   glucose blood test strip Commonly known as:  ACCU-CHEK AVIVA Use to check blood glucose twice a day   hydrALAZINE 50 MG tablet Commonly known as:  APRESOLINE Take 1 tablet (50 mg total) by mouth 3 (three) times daily.   hydrocortisone 25 MG suppository Commonly known as:  ANUSOL-HC Place 1 suppository (25 mg total) rectally 2 (two) times daily.   Insulin Detemir 100 UNIT/ML Pen Commonly known as:  LEVEMIR FLEXTOUCH Inject 24 Units into the skin every morning.   insulin lispro 100 UNIT/ML KiwkPen Commonly known as:  HUMALOG KWIKPEN Inject 0.04-0.1 mLs (4-10 Units total) into the skin daily as needed (high blood sugar).   Insulin Pen Needle 31G X 8 MM Misc Commonly known as:  GLOBAL EASE INJECT PEN NEEDLES AS DIRECTED   Insulin Syringe-Needle U-100 31G X 5/16" 1 ML Misc Commonly known as:  EASY TOUCH INSULIN SYRINGE USE AS DIRECTED.   isosorbide mononitrate 30 MG 24 hr tablet Commonly known as:  IMDUR Take 1 tablet (30 mg total) by mouth daily.   lisinopril 20 MG tablet Commonly known as:  PRINIVIL,ZESTRIL Take 1 tablet (20 mg total) by mouth every morning.   meclizine 25 MG tablet Commonly known as:  ANTIVERT TAKE 1 TABLET TWICE DAILY AS NEEDED FOR DIZZINESS.   metFORMIN 500 MG 24 hr tablet Commonly known as:  GLUCOPHAGE-XR Take 1 tablet (500 mg total) by mouth every evening.   methenamine 1 g tablet Commonly known as:  HIPREX Take 1 tablet (1 g total) by mouth 2 (two) times daily.   metoprolol tartrate 50 MG tablet Commonly known as:  LOPRESSOR Take 1 tablet (50 mg total) by mouth 2 (two) times daily.   nitroGLYCERIN 0.4 MG SL tablet Commonly known as:  NITROSTAT PLACE ONE (1) TABLET UNDER TONGUE EVERY 5 MINUTES UP TO (3) DOSES AS NEEDED FOR CHEST PAIN.   PARoxetine 20 MG tablet Commonly  known as:  PAXIL Take 1 tablet (20 mg total) by mouth every morning.   polyethylene glycol packet Commonly known as:  MIRALAX Take 17 g by mouth daily.   spironolactone 25 MG tablet Commonly known as:  ALDACTONE Take 2 every AM       Meds ordered this encounter  Medications  . albuterol (PROVENTIL HFA;VENTOLIN HFA) 108 (90 Base) MCG/ACT inhaler    Sig: Inhale 2 puffs into the lungs every 6 (six) hours as needed for wheezing or shortness of breath.    Dispense:  1 Inhaler    Refill:  5  . budesonide-formoterol (SYMBICORT) 80-4.5 MCG/ACT inhaler    Sig: Inhale 2 puffs into the lungs 2 (two) times daily.    Dispense:  1 Inhaler    Refill:  3  . esomeprazole (NEXIUM) 40 MG capsule    Sig: Take 1 capsule (40 mg total) by mouth daily.    Dispense:  90 capsule    Refill:  1  . hydrocortisone (ANUSOL-HC) 25 MG suppository    Sig: Place 1 suppository (25 mg total) rectally 2 (two) times daily.    Dispense:  12 suppository    Refill:  5  . FORA LANCETS MISC    Sig: Test blood sugars twice daily    Dispense:  200 each    Refill:  3    Dx E11.9  . glucose  blood (ACCU-CHEK AVIVA) test strip    Sig: Use to check blood glucose twice a day    Dispense:  200 each    Refill:  3    Dx E11.9  . Insulin Detemir (LEVEMIR FLEXTOUCH) 100 UNIT/ML Pen    Sig: Inject 24 Units into the skin every morning.    Dispense:  15 mL    Refill:  0  . Insulin Pen Needle (GLOBAL EASE INJECT PEN NEEDLES) 31G X 8 MM MISC    Sig: AS DIRECTED    Dispense:  100 each    Refill:  2    E11.9  . Insulin Syringe-Needle U-100 (EASY TOUCH INSULIN SYRINGE) 31G X 5/16" 1 ML MISC    Sig: USE AS DIRECTED.    Dispense:  80 each    Refill:  2    E11.9  . hydrALAZINE (APRESOLINE) 50 MG tablet    Sig: Take 1 tablet (50 mg total) by mouth 3 (three) times daily.    Dispense:  90 tablet    Refill:  2    Need to bring BP down quickly or reschedule colonoscopy.  Follow-up: Return in about 2 weeks (around  02/03/2019).  [name], M.D.

## 2019-01-20 NOTE — ED Triage Notes (Signed)
BP running in the 200's today.  pcp ordered another bp medication but they haven't gotten it delivered yet.  Reports some dizziness for past 3 days

## 2019-01-20 NOTE — ED Provider Notes (Signed)
[facility] EMERGENCY DEPARTMENT Provider Note   CSN: [id number] Arrival date & time: 01/20/19  1826     History   Chief Complaint Chief Complaint  Patient presents with  . Hypertension    HPI [name] is a 85 y.o. female.  HPI Patient with history of hypertension was seen by her primary care doctor earlier today.  Noted to have elevated blood pressure.  Patient was placed on hydralazine but is yet to fill the prescription.  Daughter states that when patient woke up from her nap she took her blood pressure "to be on the safe side".  Systolic was greater than 200.  She found her primary physician's office and was told to go to the emergency department.  Patient has history of dementia and is a limited historian.  She does complain of some lightheadedness especially with standing.  She also has ongoing abdominal pain which daughter states is not new.  No nausea or vomiting.  No chest pain.  No shortness of breath.  Patient does have some mild lower extremity swelling which is unchanged from her baseline. Past Medical History:  Diagnosis Date  . Anemia   . Anxiety   . Aortic stenosis   . Arthritis    back, knees, and hips  . Asthma    with allergies  . Balance problems   . CAD (coronary artery disease)   . Carotid stenosis   . CHF (congestive heart failure) ([place])    EF preserved Echo 2012  . CKD (chronic kidney disease)   . 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 Dementia ([place])    "a little"  . Depression   . Diabetes mellitus    type 2  . GERD (gastroesophageal reflux disease)   . Hearing loss   . Hypertension   . Iron deficiency anemia 05/27/2011  . Myocardial infarction ([place])   . Renal insufficiency   . Scoliosis   . Shortness of breath dyspnea    with activity  . Vertigo    "when sugar gets low"  . Vision loss    left eye-"half blind"    Patient Active Problem List   Diagnosis Date Noted  . Accelerated hypertension 01/20/2019  . Rectal bleeding 11/08/2018  . Rectal pain 11/08/2018  . Abnormal CT scan, colon 11/08/2018  . (HFpEF) heart failure with preserved ejection fraction ([place]) 10/09/2018  . Acute on chronic diastolic CHF (congestive heart failure) ([place]) 10/09/2018  . Aortic stenosis, moderate 10/09/2018  . Mitral valve stenosis 10/09/2018  . Acute exacerbation of CHF (congestive heart failure) ([place]) 10/07/2018  . Acute hypoxemic respiratory failure ([place]) 10/07/2018  . Chronic anemia 10/07/2018  . Asthma 10/07/2018  . CVA (cerebral vascular accident) ([place]) 10/07/2018  . CAD (coronary artery disease) 10/07/2018  . Constipation 10/07/2018  . Physical deconditioning 10/07/2018  . History of recurrent UTIs 10/07/2018  . CKD (chronic kidney disease) stage 3, GFR 30-59 ml/min (HCC) 12/22/2017  . Dyslipidemia 12/22/2017  . Weakness 11/23/2017  . Overweight (BMI 25.0-29.9) 05/08/2016  . UTI (lower urinary tract infection) 11/12/2015  . DM hyperosmolarity type II, uncontrolled ([place]) 11/12/2015  . Dementia ([place])   . Encephalopathy, metabolic 61/44/3154  . S/P right TKA 08/13/2015  . S/P knee replacement 08/13/2015  . Elevated troponin 03/06/2015  . Fever   . History of diabetes mellitus   . Left buttock pain   . Mixed hyperlipidemia 01/11/2015  .  GAD (generalized anxiety disorder) 01/11/2015  . Depression 01/11/2015  . GERD (gastroesophageal reflux disease) 01/11/2015  . Osteopenia 07/13/2014  . Obesity (BMI 30-39.9) 06/19/2014  . Carotid stenosis   . Anemia of chronic disease 12/25/2011  . Iron deficiency anemia 05/27/2011  . Diastolic CHF, chronic ([place]) 04/02/2010  . Type 2 diabetes mellitus with insulin therapy ([place]) 10/08/2009  . HLD (hyperlipidemia) 10/08/2009  . HTN (hypertension) 10/08/2009  . Coronary atherosclerosis 10/08/2009  . CAROTID STENOSIS 10/08/2009    Past Surgical History:  Procedure Laterality Date  . APPENDECTOMY      with hysterectomy  . CATARACT EXTRACTION Bilateral 5 years ago  . CHOLECYSTECTOMY    . COLONOSCOPY  2005   Dr. Amedeo Plenty: Diverticulosis  . CORONARY ANGIOPLASTY     prior to 2006 5 stents  . CORONARY ARTERY BYPASS GRAFT  2006  . [facility] [place]  April 2013  . KNEE SURGERY Right   . TOTAL ABDOMINAL HYSTERECTOMY  ~85 years old   complete, with tumor removal  . TOTAL KNEE ARTHROPLASTY Right 08/13/2015   Procedure: RIGHT  TOTAL KNEE ARTHROPLASTY;  Surgeon: Paralee Cancel, MD;  Location: [facility]S;  Service: Orthopedics;  Laterality: Right;     OB History    Gravida  4   Para  4   Term  4   Preterm      AB      Living  3     SAB      TAB      Ectopic      Multiple      Live Births               Home Medications    Prior to Admission medications   Medication Sig Start Date End Date Taking? Authorizing Provider  albuterol (PROVENTIL HFA;VENTOLIN HFA) 108 (90 Base) MCG/ACT inhaler Inhale 2 puffs into the lungs every 6 (six) hours as needed for wheezing or shortness of breath. 01/20/19  Yes [name], MD  amLODipine (NORVASC) 10 MG tablet Take 1 tablet (10 mg total) by mouth every morning. 10/18/18  Yes StacksCletus Gash, MD  Artificial Tear Ointment (DRY EYES OP) Apply 1 drop to eye daily as needed (dry/red eyes).    Yes [provider]  aspirin 81 MG chewable tablet Chew 81 mg by mouth at bedtime. 08/04/10  Yes [provider]  atorvastatin (LIPITOR) 20 MG tablet Take 1 tablet (20 mg total) by mouth at bedtime. 10/18/18  Yes [name], MD  blood glucose meter kit and supplies KIT Dispense based on insurance coverage. Use 4 times a day as directed Dx: E11.9, Z79.4 06/13/18  Yes Timmothy Euler, MD  budesonide-formoterol (SYMBICORT) 80-4.5 MCG/ACT inhaler Inhale 2 puffs into the lungs 2 (two) times daily. 01/20/19  Yes [name], MD  clopidogrel (PLAVIX) 75 MG tablet Take 1 tablet (75 mg total) by mouth every morning. 10/18/18  Yes [name],  MD  esomeprazole (NEXIUM) 40 MG capsule Take 1 capsule (40 mg total) by mouth daily. 01/20/19  Yes Stacks, Cletus Gash, MD  ferrous sulfate 324 (65 Fe) MG TBEC Take by mouth daily.   Yes [provider]  furosemide (LASIX) 20 MG tablet Take 1 tablet (20 mg total) by mouth daily. Take two tablets by mouth in the morning and 1 tablet at lunch 12/14/18  Yes Stacks, Cletus Gash, MD  glucose blood (ACCU-CHEK AVIVA) test strip Use to check blood glucose twice a day 01/20/19  Yes [name], MD  hydrALAZINE (APRESOLINE) 50 MG tablet Take 1 tablet (50 mg total) by mouth 3 (three) times daily. 01/20/19  Yes Stacks, Cletus Gash, MD  hydrocortisone (ANUSOL-HC) 25 MG suppository Place 1 suppository (25 mg total) rectally 2 (two) times daily. 01/20/19  Yes Stacks, Cletus Gash, MD  Insulin Detemir (LEVEMIR FLEXTOUCH) 100 UNIT/ML Pen Inject 24 Units into the skin every morning. 01/20/19  Yes Stacks, Cletus Gash, MD  insulin lispro (HUMALOG KWIKPEN) 100 UNIT/ML KiwkPen Inject 0.04-0.1 mLs (4-10 Units total) into the skin daily as needed (high blood sugar). 07/12/18  Yes [name], MD  Insulin Pen Needle (GLOBAL EASE INJECT PEN NEEDLES) 31G X 8 MM MISC AS DIRECTED 01/20/19  Yes Stacks, Cletus Gash, MD  Insulin Syringe-Needle U-100 (EASY TOUCH INSULIN SYRINGE) 31G X 5/16" 1 ML MISC USE AS DIRECTED. 01/20/19  Yes [name], MD  isosorbide mononitrate (IMDUR) 30 MG 24 hr tablet Take 1 tablet (30 mg total) by mouth daily. 10/18/18  Yes Stacks, Cletus Gash, MD  lisinopril (PRINIVIL,ZESTRIL) 20 MG tablet Take 1 tablet (20 mg total) by mouth every morning. 10/18/18  Yes Stacks, Cletus Gash, MD  meclizine (ANTIVERT) 25 MG tablet TAKE 1 TABLET TWICE DAILY AS NEEDED FOR DIZZINESS. 01/02/19  Yes [name], MD  metFORMIN (GLUCOPHAGE-XR) 500 MG 24 hr tablet Take 1 tablet (500 mg total) by mouth every evening. 10/18/18  Yes Stacks, Cletus Gash, MD  methenamine (HIPREX) 1 g tablet Take 1 tablet (1 g total) by mouth 2 (two) times daily. 12/14/18  Yes [name], MD  metoprolol tartrate (LOPRESSOR) 50 MG tablet Take 1 tablet (50 mg total) by mouth 2 (two) times daily. 01/03/19  Yes Stacks, Cletus Gash, MD  Multiple Vitamins-Iron (DAILY VITAMINS/IRON/BETA CAROT PO) Take 1 tablet by mouth at bedtime.   Yes [provider]  nitroGLYCERIN (NITROSTAT) 0.4 MG SL tablet PLACE ONE (1) TABLET UNDER TONGUE EVERY 5 MINUTES UP TO (3) DOSES AS NEEDED FOR CHEST PAIN. 01/02/19  Yes [name], MD  PARoxetine (PAXIL) 20 MG tablet Take 1 tablet (20 mg total) by mouth every morning. 01/03/19  Yes Stacks, Cletus Gash, MD  polyethylene glycol [facility]) packet Take 17 g by mouth daily. Patient taking differently: Take 17 g by mouth daily as needed for mild constipation or moderate constipation.  07/25/18  Yes Maudie Flakes, MD  spironolactone (ALDACTONE) 25 MG tablet Take 2 every AM 12/14/18  Yes [name], MD  FORA LANCETS MISC Test blood sugars twice daily 01/20/19   [name], MD    Family History Family History  Problem Relation Age of Onset  . Cancer Brother        porstate  . Early death Sister   . Heart disease Brother   . Heart disease Brother   . Heart attack Brother   . Heart disease Brother   . Heart attack Brother   . Diabetes Sister   . Heart attack Sister   . Diabetes Sister   . Osteoporosis Sister   . Hypertension Sister   . Heart attack Son   . Early death Son   . Pancreatitis Son   . Heart attack Daughter 67    Social History Social History   Tobacco Use  . Smoking status: Never Smoker  . Smokeless tobacco: Never Used  Substance Use Topics  . Alcohol use: No  . Drug use: No     Allergies   Advil [ibuprofen]; Heparin; and Propoxyphene n-acetaminophen   Review of Systems Review of Systems  Constitutional: Negative for chills and fever.  HENT:  Negative for sore throat and trouble swallowing.   Eyes: Negative for visual disturbance.  Respiratory: Negative for shortness of breath.   Cardiovascular: Positive for  leg swelling. Negative for chest pain.  Gastrointestinal: Positive for abdominal distention, abdominal pain and constipation. Negative for nausea and vomiting.  Genitourinary: Negative for dysuria, flank pain and frequency.  Musculoskeletal: Negative for back pain and myalgias.  Skin: Negative for rash and wound.  Neurological: Positive for light-headedness. Negative for weakness, numbness and headaches.  All other systems reviewed and are negative.    Physical Exam Updated Vital Signs BP (!) 188/87   Pulse (!) 58   Temp 98.6 F (37 C) (Temporal)   Resp 18   Ht 5' 6"  (1.676 m)   Wt 65.4 kg   SpO2 96%   BMI 23.28 kg/m   Physical Exam Vitals signs and nursing note reviewed.  Constitutional:      Appearance: She is well-developed.     Comments: Chronically ill-appearing  HENT:     Head: Normocephalic and atraumatic.     Comments: Tardive dyskinesia    Mouth/Throat:     Mouth: Mucous membranes are moist.     Pharynx: No oropharyngeal exudate.  Eyes:     Extraocular Movements: Extraocular movements intact.     Pupils: Pupils are equal, round, and reactive to light.     Comments: No nystagmus  Neck:     Musculoskeletal: Normal range of motion and neck supple. No neck rigidity or muscular tenderness.  Cardiovascular:     Rate and Rhythm: Normal rate and regular rhythm.     Heart sounds: Murmur present.     Comments: Harsh holosystolic murmur heard best over the right sternal border Pulmonary:     Effort: Pulmonary effort is normal. No respiratory distress.     Breath sounds: Normal breath sounds. No stridor. No wheezing, rhonchi or rales.  Chest:     Chest wall: No tenderness.  Abdominal:     General: Bowel sounds are normal.     Palpations: Abdomen is soft.     Tenderness: There is abdominal tenderness. There is no guarding or rebound.     Comments: Mild diffuse abdominal tenderness most pronounced in the right upper and right lower quadrants.  No rebound or guarding.    Musculoskeletal: Normal range of motion.        General: No swelling, tenderness, deformity or signs of injury.     Right lower leg: Edema present.     Left lower leg: Edema present.     Comments: 1+ bilateral lower extremity pitting edema.  Distal pulses intact.  No midline thoracic lumbar tenderness.  No CVA tenderness.  Lymphadenopathy:     Cervical: No cervical adenopathy.  Skin:    General: Skin is warm and dry.     Findings: No erythema or rash.  Neurological:     General: No focal deficit present.     Mental Status: She is alert and oriented to person, place, and time.     Comments: 5/5 motor in all extremities.  Sensation fully intact.  Psychiatric:        Mood and Affect: Mood normal.        Behavior: Behavior normal.      ED Treatments / Results  Labs (all labs ordered are listed, but only abnormal results are displayed) Labs Reviewed  CBC WITH DIFFERENTIAL/PLATELET - Abnormal; Notable for the following components:      Result Value   RBC 3.64 (*)  Hemoglobin 9.9 (*)    HCT 32.4 (*)    Platelets 148 (*)    All other components within normal limits  COMPREHENSIVE METABOLIC PANEL - Abnormal; Notable for the following components:   Glucose, Bld 280 (*)    BUN 27 (*)    Creatinine, Ser 1.15 (*)    Total Protein 6.1 (*)    Albumin 3.2 (*)    AST 13 (*)    Alkaline Phosphatase 37 (*)    GFR calc non Af Amer 43 (*)    GFR calc Af Amer 50 (*)    All other components within normal limits  LIPASE, BLOOD  URINALYSIS, ROUTINE W REFLEX MICROSCOPIC    EKG EKG Interpretation  Date/Time:  Friday January 20 2019 19:36:07 EST Ventricular Rate:  61 PR Interval:    QRS Duration: 101 QT Interval:  462 QTC Calculation: 466 R Axis:   47 Text Interpretation:  Sinus rhythm Borderline repol abnormality, diffuse leads Confirmed by Julianne Rice [phone]) on 01/20/2019 9:59:19 PM   Radiology Dg Abd Acute 2+v W 1v Chest  Result Date: 01/20/2019 CLINICAL DATA:  Lower  abdominal pain, constipation, and dizziness for 2-3 days. Nonproductive cough. EXAM: DG ABDOMEN ACUTE W/ 1V CHEST COMPARISON:  Chest radiographs 01/01/2019 FINDINGS: Sequelae of prior CABG are again identified. Aortic atherosclerosis is noted. The cardiac silhouette is borderline to mildly enlarged. The lung markings are less prominent than on the prior study, and superimposed patchy left basilar opacities have resolved. No acute airspace consolidation, overt edema, pleural effusion, or pneumothorax is identified. No intraperitoneal free air is identified. There is a large amount of stool throughout the colon. No dilated loops of bowel are seen to suggest obstruction. Right upper quadrant surgical clips, lumbar dextroscoliosis, and chronic L1 and L3 compression fractures are noted. IMPRESSION: 1. No evidence of acute cardiopulmonary disease. 2. Large colonic stool burden without evidence of bowel obstruction. Electronically Signed   By: Logan Bores M.D.   On: 01/20/2019 21:00    Procedures Procedures (including critical care time)  Medications Ordered in ED Medications  hydrALAZINE (APRESOLINE) tablet 25 mg (25 mg Oral Given 01/20/19 1932)     Initial Impression / Assessment and Plan / ED Course  I have reviewed the triage vital signs and the nursing notes.  Pertinent labs & imaging results that were available during my care of the patient were reviewed by me and considered in my medical decision making (see chart for details).    Patient's blood pressure is now 180/80.  She is mentating well.  She is at her baseline mental status.  Abdominal exam is benign.  Does show evidence of constipation on plain x-ray.  Advised to continue MiraLAX as needed for constipation.  Will follow-up with primary physician regarding monitoring of blood pressure.  Return precautions given.   Final Clinical Impressions(s) / ED Diagnoses   Final diagnoses:  Hypertension, unspecified type  Constipation, unspecified  constipation type    ED Discharge Orders    None       Julianne Rice, MD 01/20/19 2159

## 2019-01-21 LAB — CMP14+EGFR
ALT: 8 IU/L (ref 0–32)
AST: 15 IU/L (ref 0–40)
Albumin/Globulin Ratio: 1.6 (ref 1.2–2.2)
Albumin: 3.9 g/dL (ref 3.6–4.6)
Alkaline Phosphatase: 41 IU/L (ref 39–117)
BUN/Creatinine Ratio: 19 (ref 12–28)
BUN: 23 mg/dL (ref 8–27)
Bilirubin Total: 0.2 mg/dL (ref 0.0–1.2)
CO2: 22 mmol/L (ref 20–29)
CREATININE: 1.21 mg/dL — AB (ref 0.57–1.00)
Calcium: 9.7 mg/dL (ref 8.7–10.3)
Chloride: 105 mmol/L (ref 96–106)
GFR calc Af Amer: 47 mL/min/{1.73_m2} — ABNORMAL LOW (ref >59)
GFR calc non Af Amer: 41 mL/min/{1.73_m2} — ABNORMAL LOW (ref >59)
Globulin, Total: 2.5 g/dL (ref 1.5–4.5)
Glucose: 131 mg/dL — ABNORMAL HIGH (ref 65–99)
Potassium: 4.5 mmol/L (ref 3.5–5.2)
Sodium: 141 mmol/L (ref 134–144)
TOTAL PROTEIN: 6.4 g/dL (ref 6.0–8.5)

## 2019-01-21 NOTE — Progress Notes (Signed)
Hello [name],  Your lab result is normal.Some minor variations that are not significant are commonly marked abnormal, but do not represent any medical problem for you.  Best regards, [name], M.D.

## 2019-01-31 ENCOUNTER — Ambulatory Visit (HOSPITAL_COMMUNITY)
Admission: RE | Admit: 2019-01-31 | Discharge: 2019-01-31 | Disposition: A | Discharge status: Home / Self Care | Payer: Medicare Other | Source: Ambulatory Visit | Attending: Family Medicine | Admitting: Family Medicine

## 2019-01-31 ENCOUNTER — Encounter: Payer: Self-pay | Admitting: Family Medicine

## 2019-01-31 ENCOUNTER — Ambulatory Visit (INDEPENDENT_AMBULATORY_CARE_PROVIDER_SITE_OTHER): Payer: Medicare Other | Admitting: Family Medicine

## 2019-01-31 VITALS — BP 139/78 | HR 65 | Temp 97.8°F | Ht 66.0 in | Wt 148.0 lb

## 2019-01-31 DIAGNOSIS — W19XXXA Unspecified fall, initial encounter: Secondary | ICD-10-CM | POA: Insufficient documentation

## 2019-01-31 DIAGNOSIS — R42 Dizziness and giddiness: Secondary | ICD-10-CM | POA: Diagnosis not present

## 2019-01-31 DIAGNOSIS — S0083XA Contusion of other part of head, initial encounter: Secondary | ICD-10-CM

## 2019-01-31 DIAGNOSIS — S00531A Contusion of lip, initial encounter: Secondary | ICD-10-CM | POA: Diagnosis not present

## 2019-01-31 DIAGNOSIS — H02843 Edema of right eye, unspecified eyelid: Secondary | ICD-10-CM | POA: Insufficient documentation

## 2019-01-31 DIAGNOSIS — S0990XA Unspecified injury of head, initial encounter: Secondary | ICD-10-CM | POA: Diagnosis not present

## 2019-01-31 DIAGNOSIS — G9389 Other specified disorders of brain: Secondary | ICD-10-CM | POA: Insufficient documentation

## 2019-01-31 DIAGNOSIS — I6529 Occlusion and stenosis of unspecified carotid artery: Secondary | ICD-10-CM | POA: Diagnosis not present

## 2019-01-31 DIAGNOSIS — S0993XA Unspecified injury of face, initial encounter: Secondary | ICD-10-CM | POA: Diagnosis not present

## 2019-01-31 NOTE — Progress Notes (Signed)
Subjective:  [name], female    DOB: June 08, 1931, 85 y.o.   MRN: [id number]  Chief Complaint:  Pain and swelling in face after a fall   HPI: [name] is a 85 y.o. female presenting on 01/31/2019 for Pain and swelling in face after a fall   1. Fall, initial encounter   Pt presents today for a fall. Pt fell around 45 minutes ago. Pt hit face and head in the fall. Pt states she has had intermittent dizziness since the fall. She denies LOC or confusion. She states she came to the office because she did not want to go to the hospital. Pt is currently on Plavix. She denies weakness or loss of function. She denies visual disturbances, nausea, or vomiting.    Relevant past medical, surgical, family, and social history reviewed and updated as indicated.  Allergies and medications reviewed and updated.   Past Medical History:  Diagnosis Date  . Anemia   . Anxiety   . Aortic stenosis   . Arthritis    back, knees, and hips  . Asthma    with allergies  . Balance problems   . CAD (coronary artery disease)   . Carotid stenosis   . CHF (congestive heart failure) ([place])    EF preserved Echo 2012  . CKD (chronic kidney disease)   . 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 Dementia ([place])    "a little"  . Depression   . Diabetes mellitus    type 2  . GERD (gastroesophageal reflux disease)   . Hearing loss   . Hypertension   . Iron deficiency anemia 05/27/2011  . Myocardial infarction ([place])   . Renal insufficiency   . Scoliosis   . Shortness of breath dyspnea    with activity  . Vertigo    "when sugar gets low"  . Vision loss    left eye-"half blind"    Past Surgical History:  Procedure Laterality Date  . APPENDECTOMY     with hysterectomy  . CATARACT EXTRACTION Bilateral 5 years ago  . CHOLECYSTECTOMY    . COLONOSCOPY  2005   Dr. Amedeo Plenty: Diverticulosis  . CORONARY  ANGIOPLASTY     prior to 2006 5 stents  . CORONARY ARTERY BYPASS GRAFT  2006  . I & D of [place]  April 2013  . KNEE SURGERY Right   . TOTAL ABDOMINAL HYSTERECTOMY  ~85 years old   complete, with tumor removal  . TOTAL KNEE ARTHROPLASTY Right 08/13/2015   Procedure: RIGHT  TOTAL KNEE ARTHROPLASTY;  Surgeon: Paralee Cancel, MD;  Location: [facility];  Service: Orthopedics;  Laterality: Right;    Social History   Socioeconomic History  . Marital status: Widowed    Spouse name: Not on file  . Number of children: 4  . Years of education: 3  . Highest education level: 3rd grade  Occupational History  . Occupation: retired  Scientific laboratory technician  . Financial resource strain: Not very hard  . Food insecurity:    Worry: Never true    Inability: Never true  . Transportation needs:    Medical: No    Non-medical: No  Tobacco Use  . Smoking status: Never Smoker  . Smokeless tobacco: Never Used  Substance and Sexual Activity  . Alcohol use: No  . Drug use: No  . Sexual activity: Not Currently  Lifestyle  .  Physical activity:    Days per week: 0 days    Minutes per session: 0 min  . Stress: To some extent  Relationships  . Social connections:    Talks on phone: More than three times a week    Gets together: More than three times a week    Attends religious service: More than 4 times per year    Active member of club or organization: No    Attends meetings of clubs or organizations: Never    Relationship status: Widowed  . Intimate partner violence:    Fear of current or ex partner: No    Emotionally abused: No    Physically abused: No    Forced sexual activity: No  Other Topics Concern  . Not on file  Social History Narrative  . Not on file    Outpatient Encounter Medications as of 01/31/2019  Medication Sig  . albuterol (PROVENTIL HFA;VENTOLIN HFA) 108 (90 Base) MCG/ACT inhaler Inhale 2 puffs into the lungs every 6 (six) hours as needed for wheezing or shortness of breath.  Marland Kitchen  amLODipine (NORVASC) 10 MG tablet Take 1 tablet (10 mg total) by mouth every morning.  . Artificial Tear Ointment (DRY EYES OP) Apply 1 drop to eye daily as needed (dry/red eyes).   Marland Kitchen aspirin 81 MG chewable tablet Chew 81 mg by mouth at bedtime.  Marland Kitchen atorvastatin (LIPITOR) 20 MG tablet Take 1 tablet (20 mg total) by mouth at bedtime.  . blood glucose meter kit and supplies KIT Dispense based on insurance coverage. Use 4 times a day as directed Dx: E11.9, Z79.4  . budesonide-formoterol (SYMBICORT) 80-4.5 MCG/ACT inhaler Inhale 2 puffs into the lungs 2 (two) times daily.  . clopidogrel (PLAVIX) 75 MG tablet Take 1 tablet (75 mg total) by mouth every morning.  Marland Kitchen esomeprazole (NEXIUM) 40 MG capsule Take 1 capsule (40 mg total) by mouth daily.  . ferrous sulfate 324 (65 Fe) MG TBEC Take by mouth daily.  Marland Kitchen FORA LANCETS MISC Test blood sugars twice daily  . furosemide (LASIX) 20 MG tablet Take 1 tablet (20 mg total) by mouth daily. Take two tablets by mouth in the morning and 1 tablet at lunch  . glucose blood (ACCU-CHEK AVIVA) test strip Use to check blood glucose twice a day  . hydrALAZINE (APRESOLINE) 50 MG tablet Take 1 tablet (50 mg total) by mouth 3 (three) times daily.  . hydrocortisone (ANUSOL-HC) 25 MG suppository Place 1 suppository (25 mg total) rectally 2 (two) times daily.  . Insulin Detemir (LEVEMIR FLEXTOUCH) 100 UNIT/ML Pen Inject 24 Units into the skin every morning.  . insulin lispro (HUMALOG KWIKPEN) 100 UNIT/ML KiwkPen Inject 0.04-0.1 mLs (4-10 Units total) into the skin daily as needed (high blood sugar).  . Insulin Pen Needle (GLOBAL EASE INJECT PEN NEEDLES) 31G X 8 MM MISC AS DIRECTED  . Insulin Syringe-Needle U-100 (EASY TOUCH INSULIN SYRINGE) 31G X 5/16" 1 ML MISC USE AS DIRECTED.  Marland Kitchen isosorbide mononitrate (IMDUR) 30 MG 24 hr tablet Take 1 tablet (30 mg total) by mouth daily.  Marland Kitchen lisinopril (PRINIVIL,ZESTRIL) 20 MG tablet Take 1 tablet (20 mg total) by mouth every morning.  .  meclizine (ANTIVERT) 25 MG tablet TAKE 1 TABLET TWICE DAILY AS NEEDED FOR DIZZINESS.  . metFORMIN (GLUCOPHAGE-XR) 500 MG 24 hr tablet Take 1 tablet (500 mg total) by mouth every evening.  . methenamine (HIPREX) 1 g tablet Take 1 tablet (1 g total) by mouth 2 (two) times daily.  Marland Kitchen  metoprolol tartrate (LOPRESSOR) 50 MG tablet Take 1 tablet (50 mg total) by mouth 2 (two) times daily.  . Multiple Vitamins-Iron (DAILY VITAMINS/IRON/BETA CAROT PO) Take 1 tablet by mouth at bedtime.  . nitroGLYCERIN (NITROSTAT) 0.4 MG SL tablet PLACE ONE (1) TABLET UNDER TONGUE EVERY 5 MINUTES UP TO (3) DOSES AS NEEDED FOR CHEST PAIN.  Marland Kitchen PARoxetine (PAXIL) 20 MG tablet Take 1 tablet (20 mg total) by mouth every morning.  . polyethylene glycol (MIRALAX) packet Take 17 g by mouth daily. (Patient taking differently: Take 17 g by mouth daily as needed for mild constipation or moderate constipation. )  . spironolactone (ALDACTONE) 25 MG tablet Take 2 every AM   No facility-administered encounter medications on file as of 01/31/2019.     Allergies  Allergen Reactions  . Advil [Ibuprofen] Swelling  . Heparin Other (See Comments)    Confusion, hallucinations.   . Propoxyphene N-Acetaminophen Other (See Comments)    Numbness all over. "floating" sensation.    Review of Systems  Constitutional: Negative for chills, fatigue and fever.  Respiratory: Negative for cough and shortness of breath.   Cardiovascular: Negative for chest pain and leg swelling.  Gastrointestinal: Negative for nausea and vomiting.  Musculoskeletal: Negative for neck pain and neck stiffness.  Skin: Positive for wound.  Neurological: Positive for dizziness, light-headedness and headaches. Negative for tremors, seizures, syncope, facial asymmetry, speech difficulty, weakness and numbness.  Psychiatric/Behavioral: Negative for confusion.  All other systems reviewed and are negative.       Objective:  BP 139/78   Pulse 65   Temp 97.8 F (36.6 C)  (Oral)   Ht _0  (1.676 m)   Wt 148 lb (67.1 kg)   BMI 23.89 kg/m    Wt Readings from Last 3 Encounters:  01/31/19 148 lb (67.1 kg)  01/20/19 144 lb 4 oz (65.4 kg)  01/20/19 144 lb 4 oz (65.4 kg)    Physical Exam Vitals signs and nursing note reviewed.  Constitutional:      General: She is not in acute distress.    Appearance: Normal appearance. She is not ill-appearing or toxic-appearing.  HENT:     Head: Normocephalic. Contusion present. No raccoon eyes or Battle's sign.     Jaw: There is normal jaw occlusion.      Right Ear: Hearing, tympanic membrane, ear canal and external ear normal.     Left Ear: Hearing, tympanic membrane, ear canal and external ear normal.     Nose: Nose normal.     Mouth/Throat:     Lips: Pink.     Mouth: Mucous membranes are moist.     Pharynx: Oropharynx is clear. Uvula midline.  Eyes:     General: Lids are normal.     Extraocular Movements: Extraocular movements intact.     Conjunctiva/sclera: Conjunctivae normal.     Pupils: Pupils are equal, round, and reactive to light.  Neck:     Musculoskeletal: Full passive range of motion without pain and neck supple. Normal range of motion. No edema, erythema, neck rigidity, crepitus, pain with movement, spinous process tenderness or muscular tenderness.  Cardiovascular:     Rate and Rhythm: Normal rate and regular rhythm.     Heart sounds: Murmur present. Systolic murmur present with a grade of 3/6. No friction rub. No gallop.   Pulmonary:     Effort: Pulmonary effort is normal. No respiratory distress.     Breath sounds: Normal breath sounds.  Skin:    General: Skin  is warm and dry.     Capillary Refill: Capillary refill takes less than 2 seconds.     Findings: Bruising (face as noted) present.  Neurological:     General: No focal deficit present.     Mental Status: She is alert. Mental status is at baseline.     Cranial Nerves: Cranial nerves are intact.     Sensory: Sensation is intact.      Motor: Motor function is intact.  Psychiatric:        Behavior: Behavior is cooperative.     Results for orders placed or performed during the hospital encounter of 01/20/19  CBC with Differential/Platelet  Result Value Ref Range   WBC 4.5 4.0 - 10.5 K/uL   RBC 3.64 (L) 3.87 - 5.11 MIL/uL   Hemoglobin 9.9 (L) 12.0 - 15.0 g/dL   HCT 32.4 (L) 36.0 - 46.0 %   MCV 89.0 80.0 - 100.0 fL   MCH 27.2 26.0 - 34.0 pg   MCHC 30.6 30.0 - 36.0 g/dL   RDW 15.0 11.5 - 15.5 %   Platelets 148 (L) 150 - 400 K/uL   nRBC 0.0 0.0 - 0.2 %   Neutrophils Relative % 65 %   Neutro Abs 2.9 1.7 - 7.7 K/uL   Lymphocytes Relative 22 %   Lymphs Abs 1.0 0.7 - 4.0 K/uL   Monocytes Relative 9 %   Monocytes Absolute 0.4 0.1 - 1.0 K/uL   Eosinophils Relative 3 %   Eosinophils Absolute 0.1 0.0 - 0.5 K/uL   Basophils Relative 1 %   Basophils Absolute 0.0 0.0 - 0.1 K/uL   Immature Granulocytes 0 %   Abs Immature Granulocytes 0.01 0.00 - 0.07 K/uL  Comprehensive metabolic panel  Result Value Ref Range   Sodium 137 135 - 145 mmol/L   Potassium 4.2 3.5 - 5.1 mmol/L   Chloride 108 98 - 111 mmol/L   CO2 23 22 - 32 mmol/L   Glucose, Bld 280 (H) 70 - 99 mg/dL   BUN 27 (H) 8 - 23 mg/dL   Creatinine, Ser 1.15 (H) 0.44 - 1.00 mg/dL   Calcium 9.0 8.9 - 10.3 mg/dL   Total Protein 6.1 (L) 6.5 - 8.1 g/dL   Albumin 3.2 (L) 3.5 - 5.0 g/dL   AST 13 (L) 15 - 41 U/L   ALT 10 0 - 44 U/L   Alkaline Phosphatase 37 (L) 38 - 126 U/L   Total Bilirubin 0.3 0.3 - 1.2 mg/dL   GFR calc non Af Amer 43 (L) >60 mL/min   GFR calc Af Amer 50 (L) >60 mL/min   Anion gap 6 5 - 15  Lipase, blood  Result Value Ref Range   Lipase 29 11 - 51 U/L       Pertinent labs & imaging results that were available during my care of the patient were reviewed by me and considered in my medical decision making.  Assessment & Plan:  [name] was seen today for pain and swelling in face after a fall.  Diagnoses and all orders for this visit:  Fall,  initial encounter Pt declined to go to the ED for further evaluation but agreed to stat CT of head and face. Pt to go [facility] for CT scans. Pt aware of sings and symptoms that require emergent evaluation.  -     CT Head Wo Contrast; Future -     CT Maxillofacial WO CM; Future     Continue all other maintenance  medications.  Follow up plan: Return in about 1 week (around 02/07/2019), or if symptoms worsen or fail to improve.  Educational handout given for fall  The above assessment and management plan was discussed with the patient. The patient verbalized understanding of and has agreed to the management plan. Patient is aware to call the clinic if symptoms persist or worsen. Patient is aware when to return to the clinic for a follow-up visit. Patient educated on when it is appropriate to go to the emergency department.   Monia Pouch, FNP-C [facility] [phone]

## 2019-01-31 NOTE — Patient Instructions (Signed)
Fall Prevention in the Home, Adult  Falls can cause injuries. They can happen to people of all ages. There are many things you can do to make your home safe and to help prevent falls. Ask for help when making these changes, if needed.  What actions can I take to prevent falls?  General Instructions  · Use good lighting in all rooms. Replace any light bulbs that burn out.  · Turn on the lights when you go into a dark area. Use night-lights.  · Keep items that you use often in easy-to-reach places. Lower the shelves around your home if necessary.  · Set up your furniture so you have a clear path. Avoid moving your furniture around.  · Do not have throw rugs and other things on the floor that can make you trip.  · Avoid walking on wet floors.  · If any of your floors are uneven, fix them.  · Add color or contrast paint or tape to clearly mark and help you see:  ? Any grab bars or handrails.  ? First and last steps of stairways.  ? Where the edge of each step is.  · If you use a stepladder:  ? Make sure that it is fully opened. Do not climb a closed stepladder.  ? Make sure that both sides of the stepladder are locked into place.  ? Ask someone to hold the stepladder for you while you use it.  · If there are any pets around you, be aware of where they are.  What can I do in the bathroom?         · Keep the floor dry. Clean up any water that spills onto the floor as soon as it happens.  · Remove soap buildup in the tub or shower regularly.  · Use non-skid mats or decals on the floor of the tub or shower.  · Attach bath mats securely with double-sided, non-slip rug tape.  · If you need to sit down in the shower, use a plastic, non-slip stool.  · Install grab bars by the toilet and in the tub and shower. Do not use towel bars as grab bars.  What can I do in the bedroom?  · Make sure that you have a light by your bed that is easy to reach.  · Do not use any sheets or blankets that are too big for your bed. They should  not hang down onto the floor.  · Have a firm chair that has side arms. You can use this for support while you get dressed.  What can I do in the kitchen?  · Clean up any spills right away.  · If you need to reach something above you, use a strong step stool that has a grab bar.  · Keep electrical cords out of the way.  · Do not use floor polish or wax that makes floors slippery. If you must use wax, use non-skid floor wax.  What can I do with my stairs?  · Do not leave any items on the stairs.  · Make sure that you have a light switch at the top of the stairs and the bottom of the stairs. If you do not have them, ask someone to add them for you.  · Make sure that there are handrails on both sides of the stairs, and use them. Fix handrails that are broken or loose. Make sure that handrails are as long as the stairways.  ·   Install non-slip stair treads on all stairs in your home.  · Avoid having throw rugs at the top or bottom of the stairs. If you do have throw rugs, attach them to the floor with carpet tape.  · Choose a carpet that does not hide the edge of the steps on the stairway.  · Check any carpeting to make sure that it is firmly attached to the stairs. Fix any carpet that is loose or worn.  What can I do on the outside of my home?  · Use bright outdoor lighting.  · Regularly fix the edges of walkways and driveways and fix any cracks.  · Remove anything that might make you trip as you walk through a door, such as a raised step or threshold.  · Trim any bushes or trees on the path to your home.  · Regularly check to see if handrails are loose or broken. Make sure that both sides of any steps have handrails.  · Install guardrails along the edges of any raised decks and porches.  · Clear walking paths of anything that might make someone trip, such as tools or rocks.  · Have any leaves, snow, or ice cleared regularly.  · Use sand or salt on walking paths during winter.  · Clean up any spills in your garage right  away. This includes grease or oil spills.  What other actions can I take?  · Wear shoes that:  ? Have a low heel. Do not wear high heels.  ? Have rubber bottoms.  ? Are comfortable and fit you well.  ? Are closed at the toe. Do not wear open-toe sandals.  · Use tools that help you move around (mobility aids) if they are needed. These include:  ? Canes.  ? Walkers.  ? Scooters.  ? Crutches.  · Review your medicines with your doctor. Some medicines can make you feel dizzy. This can increase your chance of falling.  Ask your doctor what other things you can do to help prevent falls.  Where to find more information  · Centers for Disease Control and Prevention, [employer]: https://cdc.gov  · National Institute on Aging: https://go4life.nia.nih.gov  Contact a doctor if:  · You are afraid of falling at home.  · You feel weak, drowsy, or dizzy at home.  · You fall at home.  Summary  · There are many simple things that you can do to make your home safe and to help prevent falls.  · Ways to make your home safe include removing tripping hazards and installing grab bars in the bathroom.  · Ask for help when making these changes in your home.  This information is not intended to replace advice given to you by your health care provider. Make sure you discuss any questions you have with your health care provider.  Document Released: 09/19/2009 Document Revised: 07/08/2017 Document Reviewed: 07/08/2017  Elsevier Interactive Patient Education © 2019 [employer]

## 2019-02-02 ENCOUNTER — Other Ambulatory Visit: Payer: Self-pay | Admitting: Family Medicine

## 2019-02-02 DIAGNOSIS — E119 Type 2 diabetes mellitus without complications: Secondary | ICD-10-CM

## 2019-02-02 DIAGNOSIS — Z794 Long term (current) use of insulin: Principal | ICD-10-CM

## 2019-02-03 ENCOUNTER — Ambulatory Visit (INDEPENDENT_AMBULATORY_CARE_PROVIDER_SITE_OTHER): Payer: Medicare Other | Admitting: Family Medicine

## 2019-02-03 ENCOUNTER — Other Ambulatory Visit: Payer: Self-pay | Admitting: Family Medicine

## 2019-02-03 ENCOUNTER — Encounter: Payer: Self-pay | Admitting: Family Medicine

## 2019-02-03 VITALS — BP 130/55 | HR 66 | Temp 97.1°F | Ht 66.0 in | Wt 150.1 lb

## 2019-02-03 DIAGNOSIS — Z794 Long term (current) use of insulin: Principal | ICD-10-CM

## 2019-02-03 DIAGNOSIS — E119 Type 2 diabetes mellitus without complications: Secondary | ICD-10-CM

## 2019-02-03 DIAGNOSIS — M1990 Unspecified osteoarthritis, unspecified site: Secondary | ICD-10-CM

## 2019-02-03 DIAGNOSIS — I509 Heart failure, unspecified: Secondary | ICD-10-CM

## 2019-02-03 NOTE — Progress Notes (Signed)
Subjective:  Patient ID: [name], female    DOB: 1931-04-06  Age: 85 y.o. MRN: [id number]  CC: Medical Management of Chronic Issues (pt here today for 2 week follow up of her BP after starting Hydralazine 75m)   HPI Elmira G Heishman presents for recheck of BP. Recently started taking hydralazine due to its preload reduction effect as well as its antihypertensive effect. She denies any adverse effects.  She is however, having a great deal of instability with ambulation. She has a lot of weakness due to generalized arthritis as well.  As a result she is in need of a wheelchair.  [facility] 02/03/2019 01/20/2019 01/03/2019  Decreased Interest 1 1 1   Down, Depressed, Hopeless 1 1 1   PHQ - 2 Score 2 2 2   Altered sleeping 1 1 1   Tired, decreased energy 1 1 1   Change in appetite 0 0 0  Feeling bad or failure about yourself  0 0 0  Trouble concentrating 1 1 0  Moving slowly or fidgety/restless 1 1 0  Suicidal thoughts 0 0 0  PHQ-9 Score 6 6 4   Difficult doing work/chores - - -  Some recent data might be hidden    History Airabella has a past medical history of Anemia, Anxiety, Aortic stenosis, Arthritis, Asthma, Balance problems, CAD (coronary artery disease), Carotid stenosis, [place], CKD (chronic kidney disease), CVA (cerebral infarction), Dementia ([place], Depression, Diabetes mellitus, GERD (gastroesophageal reflux disease), Hearing loss, Hypertension, Iron deficiency anemia (05/27/2011), Myocardial infarction ([facility], Renal insufficiency, Scoliosis, Shortness of breath dyspnea, Vertigo, and Vision loss.   She has a past surgical history that includes I & D of [place] (April 2013); Coronary artery bypass graft (2006); Coronary angioplasty; Total abdominal hysterectomy (~85years old); Cholecystectomy; Appendectomy; Cataract extraction (Bilateral, 5 years ago); Total knee arthroplasty (Right, 08/13/2015); Knee surgery (Right); and Colonoscopy (2005).     Her family history includes Cancer in her brother; Diabetes in her sister and sister; Early death in her sister and son; Heart attack in her brother, brother, sister, and son; Heart attack (age of onset: 518 in her daughter; Heart disease in her brother, brother, and brother; Hypertension in her sister; Osteoporosis in her sister; Pancreatitis in her son.She reports that she has never smoked. She has never used smokeless tobacco. She reports that she does not drink alcohol or use drugs.    ROS Review of Systems  Constitutional: Negative.   HENT: Negative for congestion.   Eyes: Negative for visual disturbance.  Respiratory: Negative for shortness of breath.   Cardiovascular: Negative for chest pain.  Gastrointestinal: Negative for abdominal pain, constipation, diarrhea, nausea and vomiting.  Genitourinary: Negative for difficulty urinating.  Musculoskeletal: Negative for arthralgias and myalgias.  Neurological: Negative for headaches.  Psychiatric/Behavioral: Negative for sleep disturbance.    Objective:  BP (!) 130/55   Pulse 66   Temp (!) 97.1 F (36.2 C) (Oral)   Ht 5' 6"  (1.676 m)   Wt 150 lb 2 oz (68.1 kg)   BMI 24.23 kg/m   BP Readings from Last 3 Encounters:  02/03/19 (!) 130/55  01/31/19 139/78  01/20/19 (!) 188/87    Wt Readings from Last 3 Encounters:  02/03/19 150 lb 2 oz (68.1 kg)  01/31/19 148 lb (67.1 kg)  01/20/19 144 lb 4 oz (65.4 kg)     Physical Exam Constitutional:      General: She is not in acute distress.    Appearance: She is  well-developed.  HENT:     Head: Normocephalic and atraumatic.     Right Ear: External ear normal.     Left Ear: External ear normal.     Nose: Nose normal.  Eyes:     Conjunctiva/sclera: Conjunctivae normal.     Pupils: Pupils are equal, round, and reactive to light.  Neck:     Musculoskeletal: Normal range of motion and neck supple.     Thyroid: No thyromegaly.  Cardiovascular:     Rate and Rhythm: Normal rate  and regular rhythm.     Heart sounds: Normal heart sounds. No murmur.  Pulmonary:     Effort: Pulmonary effort is normal. No respiratory distress.     Breath sounds: Normal breath sounds. No wheezing or rales.  Abdominal:     General: Bowel sounds are normal. There is no distension.     Palpations: Abdomen is soft.     Tenderness: There is no abdominal tenderness.  Lymphadenopathy:     Cervical: No cervical adenopathy.  Skin:    General: Skin is warm and dry.  Neurological:     Mental Status: She is alert and oriented to person, place, and time.     Deep Tendon Reflexes: Reflexes are normal and symmetric.  Psychiatric:        Behavior: Behavior normal.        Thought Content: Thought content normal.        Judgment: Judgment normal.       Assessment & Plan:   Analina was seen today for medical management of chronic issues.  Diagnoses and all orders for this visit:  Congestive heart failure, unspecified HF chronicity, unspecified heart failure type ([place]) -     DME Wheelchair manual  Arthritis -     DME Wheelchair manual       I am having Myeisha G. Raybuck maintain her Artificial Tear Ointment (DRY EYES OP), Multiple Vitamins-Iron (DAILY VITAMINS/IRON/BETA CAROT PO), aspirin, blood glucose meter kit and supplies, insulin lispro, polyethylene glycol, lisinopril, isosorbide mononitrate, atorvastatin, ferrous sulfate, furosemide, methenamine, spironolactone, meclizine, nitroGLYCERIN, PARoxetine, metoprolol tartrate, albuterol, budesonide-formoterol, esomeprazole, hydrocortisone, FORA LANCETS, glucose blood, Insulin Pen Needle, Insulin Syringe-Needle U-100, hydrALAZINE, LEVEMIR FLEXTOUCH, amLODipine, clopidogrel, and metFORMIN.  Allergies as of 02/03/2019      Reactions   Advil [ibuprofen] Swelling   Heparin Other (See Comments)   Confusion, hallucinations.    Propoxyphene N-acetaminophen Other (See Comments)   Numbness all over. "floating" sensation.      Medication List         Accurate as of February 03, 2019 11:59 PM. Always use your most recent med list.        albuterol 108 (90 Base) MCG/ACT inhaler Commonly known as:  PROVENTIL HFA;VENTOLIN HFA Inhale 2 puffs into the lungs every 6 (six) hours as needed for wheezing or shortness of breath.   amLODipine 10 MG tablet Commonly known as:  NORVASC TAKE 1 TABLET DAILY.   aspirin 81 MG chewable tablet Chew 81 mg by mouth at bedtime.   atorvastatin 20 MG tablet Commonly known as:  LIPITOR Take 1 tablet (20 mg total) by mouth at bedtime.   blood glucose meter kit and supplies Kit Dispense based on insurance coverage. Use 4 times a day as directed Dx: E11.9, Z79.4   budesonide-formoterol 80-4.5 MCG/ACT inhaler Commonly known as:  SYMBICORT Inhale 2 puffs into the lungs 2 (two) times daily.   clopidogrel 75 MG tablet Commonly known as:  PLAVIX TAKE 1 TABLET DAILY.  DAILY VITAMINS/IRON/BETA CAROT PO Take 1 tablet by mouth at bedtime.   DRY EYES OP Apply 1 drop to eye daily as needed (dry/red eyes).   esomeprazole 40 MG capsule Commonly known as:  NEXIUM Take 1 capsule (40 mg total) by mouth daily.   ferrous sulfate 324 (65 Fe) MG Tbec Take 324 mg by mouth daily.   FORA LANCETS Misc Test blood sugars twice daily   furosemide 20 MG tablet Commonly known as:  LASIX Take 1 tablet (20 mg total) by mouth daily. Take two tablets by mouth in the morning and 1 tablet at lunch   glucose blood test strip Commonly known as:  ACCU-CHEK AVIVA Use to check blood glucose twice a day   hydrALAZINE 50 MG tablet Commonly known as:  APRESOLINE Take 1 tablet (50 mg total) by mouth 3 (three) times daily.   hydrocortisone 25 MG suppository Commonly known as:  ANUSOL-HC Place 1 suppository (25 mg total) rectally 2 (two) times daily.   insulin lispro 100 UNIT/ML KiwkPen Commonly known as:  HUMALOG KWIKPEN Inject 0.04-0.1 mLs (4-10 Units total) into the skin daily as needed (high blood sugar).    Insulin Pen Needle 31G X 8 MM Misc Commonly known as:  GLOBAL EASE INJECT PEN NEEDLES AS DIRECTED   Insulin Syringe-Needle U-100 31G X 5/16" 1 ML Misc Commonly known as:  EASY TOUCH INSULIN SYRINGE USE AS DIRECTED.   isosorbide mononitrate 30 MG 24 hr tablet Commonly known as:  IMDUR Take 1 tablet (30 mg total) by mouth daily.   LEVEMIR FLEXTOUCH 100 UNIT/ML Pen Generic drug:  Insulin Detemir INJECT 24 UNITS EACH MORNING   lisinopril 20 MG tablet Commonly known as:  PRINIVIL,ZESTRIL Take 1 tablet (20 mg total) by mouth every morning.   meclizine 25 MG tablet Commonly known as:  ANTIVERT TAKE 1 TABLET TWICE DAILY AS NEEDED FOR DIZZINESS.   metFORMIN 500 MG 24 hr tablet Commonly known as:  GLUCOPHAGE-XR TAKE 1 TABLET 2 TIMES A DAY WITH BREAKFAST & DINNER.   methenamine 1 g tablet Commonly known as:  HIPREX Take 1 tablet (1 g total) by mouth 2 (two) times daily.   metoprolol tartrate 50 MG tablet Commonly known as:  LOPRESSOR Take 1 tablet (50 mg total) by mouth 2 (two) times daily.   nitroGLYCERIN 0.4 MG SL tablet Commonly known as:  NITROSTAT PLACE ONE (1) TABLET UNDER TONGUE EVERY 5 MINUTES UP TO (3) DOSES AS NEEDED FOR CHEST PAIN.   PARoxetine 20 MG tablet Commonly known as:  PAXIL Take 1 tablet (20 mg total) by mouth every morning.   polyethylene glycol packet Commonly known as:  MIRALAX Take 17 g by mouth daily.   spironolactone 25 MG tablet Commonly known as:  ALDACTONE Take 2 every AM            Durable Medical Equipment  (From admission, onward)         Start     Ordered   02/03/19 0000  DME Wheelchair manual    Comments:  Patient suffers from heart failure and arthritis which impair their ability to perform daily activities like bathing, dressing, feeding, grooming and toileting in the home.  A walker will not resolve her issue with performing activities of daily living   A wheelchair will allow patient to safely perform daily activities.  Patient can safely propel the wheelchair in the home or has a caregiver who can provide assistance.  Accessories: elevating leg rests (ELRs), wheel locks, extensions and anti-tippers.  02/03/19 1210           Follow-up: Return in about 3 months (around 05/04/2019).  [name], M.D.

## 2019-02-05 ENCOUNTER — Encounter: Payer: Self-pay | Admitting: Family Medicine

## 2019-02-08 ENCOUNTER — Encounter (HOSPITAL_COMMUNITY)
Admission: RE | Admit: 2019-02-08 | Discharge: 2019-02-08 | Disposition: A | Discharge status: Home / Self Care | Payer: Medicare Other | Source: Ambulatory Visit | Attending: Gastroenterology | Admitting: Gastroenterology

## 2019-02-08 ENCOUNTER — Other Ambulatory Visit: Payer: Self-pay

## 2019-02-08 ENCOUNTER — Encounter (HOSPITAL_COMMUNITY): Payer: Self-pay

## 2019-02-08 NOTE — Patient Instructions (Signed)
    Caydence Koenig Littman  02/08/2019     @PREFPERIOPPHARMACY @   Your procedure is scheduled on  02/14/2019  Report to [facility] at  [email or web address].[email or web address].  Call this number if you have problems the morning of surgery:  [phone]   Remember:                        Take these medicines the morning of surgery with A SIP OF WATER  Amlodiopine, nexium, isosorbide, antivert, metoprolol, paxil. Take your inhaler before you come. Take 1/2 your usual night time insulin the night before your porcedure. NO diabetes medication the morning of your procedure.    Do not wear jewelry, make-up or nail polish.  Do not wear lotions, powders, or perfumes, or deodorant.  Do not shave 48 hours prior to surgery.  Men may shave face and neck.  Do not bring valuables to the hospital.  [facility] is not responsible for any belongings or valuables.  Contacts, dentures or bridgework may not be worn into surgery.  Leave your suitcase in the car.  After surgery it may be brought to your room.  For patients admitted to the hospital, discharge time will be determined by your treatment team.  Patients discharged the day of surgery will not be allowed to drive home.   Name and phone number of your driver:   daughter Special instructions:  None  Please read over the following fact sheets that you were given. Anesthesia Post-op Instructions and Care and Recovery After Surgery

## 2019-02-08 NOTE — Progress Notes (Signed)
   02/08/19 1241  OBSTRUCTIVE SLEEP APNEA  Have you ever been diagnosed with sleep apnea through a sleep study? No  Do you snore loudly (loud enough to be heard through closed doors)?  1  Do you often feel tired, fatigued, or sleepy during the daytime (such as falling asleep during driving or talking to someone)? 1  Has anyone observed you stop breathing during your sleep? 1  Do you have, or are you being treated for high blood pressure? 1  BMI more than 35 kg/m2? 0  Age > 50 (1-yes) 1  Neck circumference greater than:Female 16 inches or larger, Female 17inches or larger? 0  Female Gender (Yes=1) 0  Obstructive Sleep Apnea Score 5  Score 5 or greater  Results sent to PCP

## 2019-02-12 ENCOUNTER — Other Ambulatory Visit: Payer: Self-pay | Admitting: Family Medicine

## 2019-02-13 ENCOUNTER — Encounter: Payer: Self-pay | Admitting: Cardiology

## 2019-02-13 NOTE — Progress Notes (Signed)
HPI The patient presents for one-year followup of CAD.  She was in the ED recently for hypertensive urgency.  I reviewed these records for this visit.  She was treated with hydralazine and discharge from the ED. she apparently had severe constipation and was told that was the etiology of her blood pressure going up.  She had this managed and subsequently has had colonoscopy.  From a cardiovascular standpoint she is actually doing relatively well.  She denies any chest pressure, neck or arm discomfort.   She gets around and does her activities of daily living including some light housekeeping.  The patient does have snoring.  She apparently has restless legs per her daughter's report.  She seems like she stops breathing.   Allergies  Allergen Reactions  . Advil [Ibuprofen] Swelling  . Heparin Other (See Comments)    Confusion, hallucinations.   . Propoxyphene N-Acetaminophen Other (See Comments)    Numbness all over. "floating" sensation.    Current Outpatient Medications  Medication Sig Dispense Refill  . albuterol (PROVENTIL HFA;VENTOLIN HFA) 108 (90 Base) MCG/ACT inhaler Inhale 2 puffs into the lungs every 6 (six) hours as needed for wheezing or shortness of breath. 1 Inhaler 5  . amLODipine (NORVASC) 10 MG tablet TAKE 1 TABLET DAILY. 30 tablet 5  . Artificial Tear Ointment (DRY EYES OP) Apply 1 drop to eye daily as needed (dry/red eyes).     Marland Kitchen aspirin 81 MG chewable tablet Chew 81 mg by mouth at bedtime.    Marland Kitchen atorvastatin (LIPITOR) 20 MG tablet Take 1 tablet (20 mg total) by mouth at bedtime. 90 tablet 3  . budesonide-formoterol (SYMBICORT) 80-4.5 MCG/ACT inhaler Inhale 2 puffs into the lungs 2 (two) times daily. (Patient taking differently: Inhale 2 puffs into the lungs 2 (two) times daily as needed (shortness of breath or wheezing). ) 1 Inhaler 3  . clopidogrel (PLAVIX) 75 MG tablet TAKE 1 TABLET DAILY. 30 tablet 5  . esomeprazole (NEXIUM) 40 MG capsule Take 1 capsule (40 mg  total) by mouth daily. 90 capsule 1  . ferrous sulfate 324 (65 Fe) MG TBEC Take 324 mg by mouth daily.     . furosemide (LASIX) 20 MG tablet 20 mg. Take 2 tablets by mouth in the morning    . hydrALAZINE (APRESOLINE) 50 MG tablet Take 1 tablet (50 mg total) by mouth 3 (three) times daily. 90 tablet 2  . hydrocortisone (ANUSOL-HC) 25 MG suppository Place 1 suppository (25 mg total) rectally 2 (two) times daily. 12 suppository 5  . insulin lispro (HUMALOG KWIKPEN) 100 UNIT/ML KiwkPen Inject 0.04-0.1 mLs (4-10 Units total) into the skin daily as needed (high blood sugar). (Patient taking differently: Inject 5-15 Units into the skin 2 (two) times daily. ) 15 mL 5  . isosorbide mononitrate (IMDUR) 30 MG 24 hr tablet Take 1 tablet (30 mg total) by mouth daily. 90 tablet 3  . lisinopril (PRINIVIL,ZESTRIL) 20 MG tablet Take 1 tablet (20 mg total) by mouth every morning. 90 tablet 2  . meclizine (ANTIVERT) 25 MG tablet TAKE 1 TABLET TWICE DAILY AS NEEDED FOR DIZZINESS. (Patient taking differently: Take 25 mg by mouth 2 (two) times daily. ) 60 tablet 2  . metFORMIN (GLUCOPHAGE-XR) 500 MG 24 hr tablet TAKE 1 TABLET 2 TIMES A DAY WITH BREAKFAST & DINNER. 60 tablet 2  . methenamine (HIPREX) 1 g tablet Take 1 tablet (1 g total) by mouth 2 (two) times daily. 60 tablet 5  . metoprolol tartrate (  LOPRESSOR) 50 MG tablet Take 1 tablet (50 mg total) by mouth 2 (two) times daily. 180 tablet 2  . Multiple Vitamins-Iron (DAILY VITAMINS/IRON/BETA CAROT PO) Take 1 tablet by mouth at bedtime.    . nitroGLYCERIN (NITROSTAT) 0.4 MG SL tablet PLACE ONE (1) TABLET UNDER TONGUE EVERY 5 MINUTES UP TO (3) DOSES AS NEEDED FOR CHEST PAIN. (Patient taking differently: Place 0.4 mg under the tongue every 5 (five) minutes as needed for chest pain. ) 25 tablet 2  . omeprazole (PRILOSEC) 20 MG capsule 1 PO 30 MINS PRIOR TO BREAKFAST. 90 capsule 3  . PARoxetine (PAXIL) 20 MG tablet Take 1 tablet (20 mg total) by mouth every morning. 90  tablet 1  . polyethylene glycol (MIRALAX) packet Take 17 g by mouth daily. (Patient taking differently: Take 17 g by mouth daily as needed for mild constipation or moderate constipation. ) 30 each 0  . spironolactone (ALDACTONE) 25 MG tablet TAKE 2 TABLETS BY MOUTH EVERY MORNING. 30 tablet 4  . blood glucose meter kit and supplies KIT Dispense based on insurance coverage. Use 4 times a day as directed Dx: E11.9, Z79.4 1 each 0  . FORA LANCETS MISC Test blood sugars twice daily 200 each 3  . glucose blood (ACCU-CHEK AVIVA) test strip Use to check blood glucose twice a day 200 each 3  . Insulin Pen Needle (GLOBAL EASE INJECT PEN NEEDLES) 31G X 8 MM MISC AS DIRECTED 100 each 2  . Insulin Syringe-Needle U-100 (EASY TOUCH INSULIN SYRINGE) 31G X 5/16" 1 ML MISC USE AS DIRECTED. 80 each 2  . LEVEMIR FLEXTOUCH 100 UNIT/ML Pen INJECT 24 UNITS EACH MORNING 6 mL 2   No current facility-administered medications for this visit.     Past Medical History:  Diagnosis Date  . Anemia   . Anxiety   . Aortic stenosis   . Arthritis    back, knees, and hips  . Asthma    with allergies  . Balance problems   . CAD (coronary artery disease)   . Carotid stenosis   . CHF (congestive heart failure) ([place])    EF preserved Echo 2012  . CKD (chronic kidney disease)   . 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 Dementia ([place])    "a little"  . Depression   . Diabetes mellitus    type 2  . GERD (gastroesophageal reflux disease)   . Hypertension   . Iron deficiency anemia 05/27/2011  . Myocardial infarction ([place])   . Renal insufficiency   . Scoliosis   . Vertigo    "when sugar gets low"  . Vision loss    left eye-"half blind"    Past Surgical History:  Procedure Laterality Date  . APPENDECTOMY     with hysterectomy  . CATARACT EXTRACTION Bilateral 5 years ago  . CHOLECYSTECTOMY    . COLONOSCOPY  2005   Dr. Amedeo Plenty:  Diverticulosis  . CORONARY ANGIOPLASTY     prior to 2006 5 stents  . CORONARY ARTERY BYPASS GRAFT  2006  . I & D of [place]  April 2013  . KNEE SURGERY Right   . TOTAL ABDOMINAL HYSTERECTOMY  ~85 years old   complete, with tumor removal  . TOTAL KNEE ARTHROPLASTY Right 08/13/2015   Procedure: RIGHT  TOTAL KNEE ARTHROPLASTY;  Surgeon: Paralee Cancel, MD;  Location: [facility];  Service: Orthopedics;  Laterality: Right;    ROS:  Positive for constipation, hemorrhoids.  Otherwise as stated in the HPI and negative for all other systems.  PHYSICAL EXAM BP 138/60   Pulse (!) 56   Ht 5' 6"  (1.676 m)   Wt 148 lb (67.1 kg)   BMI 23.89 kg/m   GENERAL:  Well appearing NECK:  No jugular venous distention, waveform within normal limits, carotid upstroke brisk and symmetric, no bruits, no thyromegaly LUNGS:  Clear to auscultation bilaterally CHEST:  Unremarkable HEART:  PMI not displaced or sustained,S1 and S2 within normal limits, no S3, no S4, no clicks, no rubs, 3 out of 6 apical systolic murmur radiating out the aortic outflow tract, no diastolic murmurs ABD:  Flat, positive bowel sounds normal in frequency in pitch, no bruits, no rebound, no guarding, no midline pulsatile mass, no hepatomegaly, no splenomegaly EXT:  2 plus pulses upper and diminished dorsalis pedis and posterior tibialis bilateral, mild leg edema, no cyanosis no clubbing   EKG: 01/30/2019 normal sinus rhythm, rate 61, axis within normal limits, intervals within normal limits, no acute ST-T wave changes, poor anterior R wave progression  Lab Results  Component Value Date   CHOL 150 10/18/2018   TRIG 130 10/18/2018   HDL 82 10/18/2018   LDLCALC 42 10/18/2018   Lab Results  Component Value Date   CREATININE 1.15 (H) 01/20/2019    ASSESSMENT AND PLAN   CAD:  The patient has no new sypmtoms.  No further cardiovascular testing is indicated.  We will continue with aggressive risk reduction and meds as listed.  CAROTID  STENOSIS:  This was mild in 2017.  No further testing at this time. Continue with risk reduction.   AS: This was moderate last year on echo.  I will follow this clinically and repeat an echo later this year.  HTN: Her blood pressure is actually well controlled at home per her daughter's report.  No change in therapy.   DM:  Her A1C is down to 6.8.  I applaud her better sugar control and no change in therapy is indicated.  DYSLIPIDEMIA:  LDL was controlled as above.  Continue the meds as listed.   CKD:  Creat was elevated in Dec but back to baseline in Feb.    SNORING: I will order a sleep study given the high risk of possible sleep apnea.

## 2019-02-14 ENCOUNTER — Other Ambulatory Visit: Payer: Self-pay

## 2019-02-14 ENCOUNTER — Encounter (HOSPITAL_COMMUNITY)
Admission: RE | Disposition: A | Discharge status: Home / Self Care | Payer: Self-pay | Source: Home / Self Care | Attending: Gastroenterology

## 2019-02-14 ENCOUNTER — Ambulatory Visit (HOSPITAL_COMMUNITY): Payer: Medicare Other | Admitting: Anesthesiology

## 2019-02-14 ENCOUNTER — Ambulatory Visit (HOSPITAL_COMMUNITY)
Admission: RE | Admit: 2019-02-14 | Discharge: 2019-02-14 | Disposition: A | Discharge status: Home / Self Care | Payer: Medicare Other | Attending: Gastroenterology | Admitting: Gastroenterology

## 2019-02-14 ENCOUNTER — Encounter (HOSPITAL_COMMUNITY): Payer: Self-pay | Admitting: Anesthesiology

## 2019-02-14 DIAGNOSIS — Q438 Other specified congenital malformations of intestine: Secondary | ICD-10-CM | POA: Insufficient documentation

## 2019-02-14 DIAGNOSIS — I509 Heart failure, unspecified: Secondary | ICD-10-CM | POA: Diagnosis not present

## 2019-02-14 DIAGNOSIS — I2510 Atherosclerotic heart disease of native coronary artery without angina pectoris: Secondary | ICD-10-CM | POA: Diagnosis not present

## 2019-02-14 DIAGNOSIS — Z7902 Long term (current) use of antithrombotics/antiplatelets: Secondary | ICD-10-CM | POA: Diagnosis not present

## 2019-02-14 DIAGNOSIS — M1990 Unspecified osteoarthritis, unspecified site: Secondary | ICD-10-CM | POA: Diagnosis not present

## 2019-02-14 DIAGNOSIS — K5730 Diverticulosis of large intestine without perforation or abscess without bleeding: Secondary | ICD-10-CM | POA: Diagnosis not present

## 2019-02-14 DIAGNOSIS — J45909 Unspecified asthma, uncomplicated: Secondary | ICD-10-CM | POA: Insufficient documentation

## 2019-02-14 DIAGNOSIS — Z79899 Other long term (current) drug therapy: Secondary | ICD-10-CM | POA: Insufficient documentation

## 2019-02-14 DIAGNOSIS — F329 Major depressive disorder, single episode, unspecified: Secondary | ICD-10-CM | POA: Insufficient documentation

## 2019-02-14 DIAGNOSIS — K635 Polyp of colon: Secondary | ICD-10-CM

## 2019-02-14 DIAGNOSIS — Z794 Long term (current) use of insulin: Secondary | ICD-10-CM | POA: Insufficient documentation

## 2019-02-14 DIAGNOSIS — N189 Chronic kidney disease, unspecified: Secondary | ICD-10-CM | POA: Insufficient documentation

## 2019-02-14 DIAGNOSIS — M419 Scoliosis, unspecified: Secondary | ICD-10-CM | POA: Diagnosis not present

## 2019-02-14 DIAGNOSIS — D120 Benign neoplasm of cecum: Secondary | ICD-10-CM | POA: Diagnosis not present

## 2019-02-14 DIAGNOSIS — Z8249 Family history of ischemic heart disease and other diseases of the circulatory system: Secondary | ICD-10-CM | POA: Diagnosis not present

## 2019-02-14 DIAGNOSIS — K648 Other hemorrhoids: Secondary | ICD-10-CM | POA: Diagnosis not present

## 2019-02-14 DIAGNOSIS — I130 Hypertensive heart and chronic kidney disease with heart failure and stage 1 through stage 4 chronic kidney disease, or unspecified chronic kidney disease: Secondary | ICD-10-CM | POA: Diagnosis not present

## 2019-02-14 DIAGNOSIS — K219 Gastro-esophageal reflux disease without esophagitis: Secondary | ICD-10-CM | POA: Insufficient documentation

## 2019-02-14 DIAGNOSIS — E1122 Type 2 diabetes mellitus with diabetic chronic kidney disease: Secondary | ICD-10-CM | POA: Diagnosis not present

## 2019-02-14 DIAGNOSIS — K921 Melena: Secondary | ICD-10-CM | POA: Diagnosis not present

## 2019-02-14 DIAGNOSIS — Z951 Presence of aortocoronary bypass graft: Secondary | ICD-10-CM | POA: Insufficient documentation

## 2019-02-14 DIAGNOSIS — F419 Anxiety disorder, unspecified: Secondary | ICD-10-CM | POA: Insufficient documentation

## 2019-02-14 DIAGNOSIS — R933 Abnormal findings on diagnostic imaging of other parts of digestive tract: Secondary | ICD-10-CM | POA: Diagnosis not present

## 2019-02-14 DIAGNOSIS — Z8673 Personal history of transient ischemic attack (TIA), and cerebral infarction without residual deficits: Secondary | ICD-10-CM | POA: Diagnosis not present

## 2019-02-14 DIAGNOSIS — F0390 Unspecified dementia without behavioral disturbance: Secondary | ICD-10-CM | POA: Diagnosis not present

## 2019-02-14 DIAGNOSIS — Z7982 Long term (current) use of aspirin: Secondary | ICD-10-CM | POA: Insufficient documentation

## 2019-02-14 DIAGNOSIS — I252 Old myocardial infarction: Secondary | ICD-10-CM | POA: Diagnosis not present

## 2019-02-14 DIAGNOSIS — H5462 Unqualified visual loss, left eye, normal vision right eye: Secondary | ICD-10-CM | POA: Diagnosis not present

## 2019-02-14 DIAGNOSIS — K625 Hemorrhage of anus and rectum: Secondary | ICD-10-CM

## 2019-02-14 HISTORY — PX: COLONOSCOPY WITH PROPOFOL: SHX5780

## 2019-02-14 HISTORY — PX: POLYPECTOMY: SHX5525

## 2019-02-14 LAB — GLUCOSE, CAPILLARY: Glucose-Capillary: 141 mg/dL — ABNORMAL HIGH (ref 70–99)

## 2019-02-14 SURGERY — COLONOSCOPY WITH PROPOFOL
Anesthesia: General

## 2019-02-14 MED ORDER — OMEPRAZOLE 20 MG PO CPDR: DELAYED_RELEASE_CAPSULE | ORAL | 3 refills | Status: DC

## 2019-02-14 MED ORDER — STERILE WATER FOR IRRIGATION IR SOLN
Status: DC | PRN
  Administered 2019-02-14: 100 mL

## 2019-02-14 MED ORDER — LACTATED RINGERS IV SOLN
INTRAVENOUS | Status: DC
  Administered 2019-02-14: 08:00:00 via INTRAVENOUS

## 2019-02-14 MED ORDER — CHLORHEXIDINE GLUCONATE CLOTH 2 % EX PADS: 6.0000 | MEDICATED_PAD | Freq: Once | CUTANEOUS | Status: DC

## 2019-02-14 MED ORDER — PROPOFOL 10 MG/ML IV BOLUS
INTRAVENOUS | Status: DC | PRN
  Administered 2019-02-14 (×3): 10 mg via INTRAVENOUS

## 2019-02-14 MED ORDER — PROPOFOL 10 MG/ML IV BOLUS
INTRAVENOUS | Status: AC
  Filled 2019-02-14: qty 40

## 2019-02-14 MED ORDER — PROMETHAZINE HCL 25 MG/ML IJ SOLN: 6.2500 mg | INTRAMUSCULAR | Status: DC | PRN

## 2019-02-14 MED ORDER — PROPOFOL 10 MG/ML IV BOLUS
INTRAVENOUS | Status: AC
  Filled 2019-02-14: qty 20

## 2019-02-14 MED ORDER — PROPOFOL 500 MG/50ML IV EMUL
INTRAVENOUS | Status: DC | PRN
  Administered 2019-02-14: 100 ug/kg/min via INTRAVENOUS

## 2019-02-14 MED ORDER — MIDAZOLAM HCL 2 MG/2ML IJ SOLN: 0.5000 mg | Freq: Once | INTRAMUSCULAR | Status: DC | PRN

## 2019-02-14 MED ORDER — HYDROMORPHONE HCL 1 MG/ML IJ SOLN: 0.2500 mg | INTRAMUSCULAR | Status: DC | PRN

## 2019-02-14 NOTE — Discharge Instructions (Signed)
NO MASS WAS FOUND IN YOUR COLON. You have small internal hemorrhoids, which can cause rectal bleeding. you have diverticulosis IN YOUR LEFT AND RIGHT COLON. YOU HAD ONE POLYP REMOVED.    DRINK WATER TO KEEP YOUR URINE LIGHT YELLOW.  FOLLOW A HIGH FIBER DIET. AVOID ITEMS THAT CAUSE BLOATING. See info below.   TO PREVENT CONSTIPATION, USE MIRALAX 1 SCOOP DAILY IN 8 OZ OF LIQUID AND INCREASE TO TWICE DAILY IF YOU ARE NOT HAVING A BM 3-4 TIMES A WEEK.  USE PREPARATION H FOUR TIMES  A DAY IF NEEDED TO RELIEVE RECTAL PAIN/PRESSURE/BLEEDING.   YOUR BIOPSY RESULTS WILL BE BACK IN 5 BUSINESS DAYS.  FOLLOW UP WITH DR. Georganne Siple IF YOU HAVE QUESTIONS OR CONCERNS.  We do not routinely screen for polyps after the age of 27.   Colonoscopy Care After Read the instructions outlined below and refer to this sheet in the next week. These discharge instructions provide you with general information on caring for yourself after you leave the hospital. While your treatment has been planned according to the most current medical practices available, unavoidable complications occasionally occur. If you have any problems or questions after discharge, call DR. Hoda Hon, [phone].  ACTIVITY  You may resume your regular activity, but move at a slower pace for the next 24 hours.   Take frequent rest periods for the next 24 hours.   Walking will help get rid of the air and reduce the bloated feeling in your belly (abdomen).   No driving for 24 hours (because of the medicine (anesthesia) used during the test).   You may shower.   Do not sign any important legal documents or operate any machinery for 24 hours (because of the anesthesia used during the test).    NUTRITION  Drink plenty of fluids.   You may resume your normal diet as instructed by your doctor.   Begin with a light meal and progress to your normal diet. Heavy or fried foods are harder to digest and may make you feel sick to your stomach  (nauseated).   Avoid alcoholic beverages for 24 hours or as instructed.    MEDICATIONS  You may resume your normal medications.   WHAT YOU CAN EXPECT TODAY  Some feelings of bloating in the abdomen.   Passage of more gas than usual.   Spotting of blood in your stool or on the toilet paper  .  IF YOU HAD POLYPS REMOVED DURING THE COLONOSCOPY:  Eat a soft diet IF YOU HAVE NAUSEA, BLOATING, ABDOMINAL PAIN, OR VOMITING.    FINDING OUT THE RESULTS OF YOUR TEST Not all test results are available during your visit. DR. Oneida Alar WILL CALL YOU WITHIN 14 DAYS OF YOUR PROCEDUE WITH YOUR RESULTS. Do not assume everything is normal if you have not heard from DR. Marily Konczal, CALL HER OFFICE AT [phone].  SEEK IMMEDIATE MEDICAL ATTENTION AND CALL THE OFFICE: [phone] IF:  You have more than a spotting of blood in your stool.   Your belly is swollen (abdominal distention).   You are nauseated or vomiting.   You have a temperature over 101F.   You have abdominal pain or discomfort that is severe or gets worse throughout the day.  High-Fiber Diet A high-fiber diet changes your normal diet to include more whole grains, legumes, fruits, and vegetables. Changes in the diet involve replacing refined carbohydrates with unrefined foods. The calorie level of the diet is essentially unchanged. The Dietary Reference Intake (recommended amount) for adult  males is 38 grams per day. For adult females, it is 25 grams per day. Pregnant and lactating women should consume 28 grams of fiber per day. Fiber is the intact part of a plant that is not broken down during digestion. Functional fiber is fiber that has been isolated from the plant to provide a beneficial effect in the body.  PURPOSE  Increase stool bulk.   Ease and regulate bowel movements.   Lower cholesterol.   REDUCE RISK OF COLON CANCER  INDICATIONS THAT YOU NEED MORE FIBER  Constipation and hemorrhoids.   Uncomplicated  diverticulosis (intestine condition) and irritable bowel syndrome.   Weight management.   As a protective measure against hardening of the arteries (atherosclerosis), diabetes, and cancer.   GUIDELINES FOR INCREASING FIBER IN THE DIET  Start adding fiber to the diet slowly. A gradual increase of about 5 more grams (2 slices of whole-wheat bread, 2 servings of most fruits or vegetables, or 1 bowl of high-fiber cereal) per day is best. Too rapid an increase in fiber may result in constipation, flatulence, and bloating.   Drink enough water and fluids to keep your urine clear or pale yellow. Water, juice, or caffeine-free drinks are recommended. Not drinking enough fluid may cause constipation.   Eat a variety of high-fiber foods rather than one type of fiber.   Try to increase your intake of fiber through using high-fiber foods rather than fiber pills or supplements that contain small amounts of fiber.   The goal is to change the types of food eaten. Do not supplement your present diet with high-fiber foods, but replace foods in your present diet.   INCLUDE A VARIETY OF FIBER SOURCES  Replace refined and processed grains with whole grains, canned fruits with fresh fruits, and incorporate other fiber sources. White rice, white breads, and most bakery goods contain little or no fiber.   Brown whole-grain rice, buckwheat oats, and many fruits and vegetables are all good sources of fiber. These include: broccoli, Brussels sprouts, cabbage, cauliflower, beets, sweet potatoes, white potatoes (skin on), carrots, tomatoes, eggplant, squash, berries, fresh fruits, and dried fruits.   Cereals appear to be the richest source of fiber. Cereal fiber is found in whole grains and bran. Bran is the fiber-rich outer coat of cereal grain, which is largely removed in refining. In whole-grain cereals, the bran remains. In breakfast cereals, the largest amount of fiber is found in those with "bran" in their names.  The fiber content is sometimes indicated on the label.   You may need to include additional fruits and vegetables each day.   In baking, for 1 cup white flour, you may use the following substitutions:   1 cup whole-wheat flour minus 2 tablespoons.   1/2 cup white flour plus 1/2 cup whole-wheat flour.   Polyps, Colon  A polyp is extra tissue that grows inside your body. Colon polyps grow in the large intestine. The large intestine, also called the colon, is part of your digestive system. It is a long, hollow tube at the end of your digestive tract where your body makes and stores stool. Most polyps are not dangerous. They are benign. This means they are not cancerous. But over time, some types of polyps can turn into cancer. Polyps that are smaller than a pea are usually not harmful. But larger polyps could someday become or may already be cancerous. To be safe, doctors remove all polyps and test them.   PREVENTION There is not one sure  way to prevent polyps. You might be able to lower your risk of getting them if you:  Eat more fruits and vegetables and less fatty food.   Do not smoke.   Avoid alcohol.   Exercise every day.   Lose weight if you are overweight.   Eating more calcium and folate can also lower your risk of getting polyps. Some foods that are rich in calcium are milk, cheese, and broccoli. Some foods that are rich in folate are chickpeas, kidney beans, and spinach.    Diverticulosis Diverticulosis is a common condition that develops when small pouches (diverticula) form in the wall of the colon. The risk of diverticulosis increases with age. It happens more often in people who eat a low-fiber diet. Most individuals with diverticulosis have no symptoms. Those individuals with symptoms usually experience belly (abdominal) pain, constipation, or loose stools (diarrhea).  HOME CARE INSTRUCTIONS  Increase the amount of fiber in your diet as directed by your caregiver or  dietician. This may reduce symptoms of diverticulosis.   Drink at least 6 to 8 glasses of water each day to prevent constipation.   Try not to strain when you have a bowel movement.   Avoiding nuts and seeds to prevent complications is NOT NECESSARY.   FOODS HAVING HIGH FIBER CONTENT INCLUDE:  Fruits. Apple, peach, pear, tangerine, raisins, prunes.   Vegetables. Brussels sprouts, asparagus, broccoli, cabbage, carrot, cauliflower, romaine lettuce, spinach, summer squash, tomato, winter squash, zucchini.   Starchy Vegetables. Baked beans, kidney beans, lima beans, split peas, lentils, potatoes (with skin).   Grains. Whole wheat bread, brown rice, bran flake cereal, plain oatmeal, white rice, shredded wheat, bran muffins.   SEEK IMMEDIATE MEDICAL CARE IF:  You develop increasing pain or severe bloating.   You have an oral temperature above 101F.   You develop vomiting or bowel movements that are bloody or black.

## 2019-02-14 NOTE — Transfer of Care (Signed)
Immediate Anesthesia Transfer of Care Note  Patient: [name]  Procedure(s) Performed: COLONOSCOPY WITH PROPOFOL (N/A ) POLYPECTOMY  Patient Location: PACU  Anesthesia Type:General  Level of Consciousness: awake and patient cooperative  Airway & Oxygen Therapy: Patient Spontanous Breathing and Patient connected to nasal cannula oxygen  Post-op Assessment: Report given to RN and Post -op Vital signs reviewed and stable  Post vital signs: Reviewed and stable  Last Vitals:  Vitals Value Taken Time  BP    Temp    Pulse    Resp    SpO2      Last Pain:  Vitals:   02/14/19 0913  TempSrc:   PainSc: 0-No pain         Complications: No apparent anesthesia complications

## 2019-02-14 NOTE — H&P (Signed)
Primary Care Physician:  [name], MD Primary Gastroenterologist:  Dr. Oneida Alar  Pre-Procedure History & Physical: HPI:  [name] is a 85 y.o. female here for   Past Medical History:  Diagnosis Date  . Anemia   . Anxiety   . Aortic stenosis   . Arthritis    back, knees, and hips  . Asthma    with allergies  . Balance problems   . CAD (coronary artery disease)   . Carotid stenosis   . CHF (congestive heart failure) ([place])    EF preserved Echo 2012  . CKD (chronic kidney disease)   . 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 Dementia ([place])    "a little"  . Depression   . Diabetes mellitus    type 2  . GERD (gastroesophageal reflux disease)   . Hypertension   . Iron deficiency anemia 05/27/2011  . Myocardial infarction ([place])   . Renal insufficiency   . Scoliosis   . Vertigo    "when sugar gets low"  . Vision loss    left eye-"half blind"    Past Surgical History:  Procedure Laterality Date  . APPENDECTOMY     with hysterectomy  . CATARACT EXTRACTION Bilateral 5 years ago  . CHOLECYSTECTOMY    . COLONOSCOPY  2005   Dr. Amedeo Plenty: Diverticulosis  . CORONARY ANGIOPLASTY     prior to 2006 5 stents  . CORONARY ARTERY BYPASS GRAFT  2006  . I & D of [place]  April 2013  . KNEE SURGERY Right   . TOTAL ABDOMINAL HYSTERECTOMY  ~85 years old   complete, with tumor removal  . TOTAL KNEE ARTHROPLASTY Right 08/13/2015   Procedure: RIGHT  TOTAL KNEE ARTHROPLASTY;  Surgeon: Paralee Cancel, MD;  Location: [facility];  Service: Orthopedics;  Laterality: Right;    Prior to Admission medications   Medication Sig Start Date End Date Taking? Authorizing Provider  albuterol (PROVENTIL HFA;VENTOLIN HFA) 108 (90 Base) MCG/ACT inhaler Inhale 2 puffs into the lungs every 6 (six) hours as needed for wheezing or shortness of breath. 01/20/19  Yes Stacks, Cletus Gash, MD  amLODipine (NORVASC) 10 MG tablet TAKE 1  TABLET DAILY. 02/03/19  Yes StacksCletus Gash, MD  Artificial Tear Ointment (DRY EYES OP) Apply 1 drop to eye daily as needed (dry/red eyes).    Yes [provider]  aspirin 81 MG chewable tablet Chew 81 mg by mouth at bedtime. 08/04/10  Yes [provider]  atorvastatin (LIPITOR) 20 MG tablet Take 1 tablet (20 mg total) by mouth at bedtime. 10/18/18  Yes Stacks, Cletus Gash, MD  budesonide-formoterol (SYMBICORT) 80-4.5 MCG/ACT inhaler Inhale 2 puffs into the lungs 2 (two) times daily. Patient taking differently: Inhale 2 puffs into the lungs 2 (two) times daily as needed (shortness of breath or wheezing).  01/20/19  Yes Stacks, Cletus Gash, MD  clopidogrel (PLAVIX) 75 MG tablet TAKE 1 TABLET DAILY. 02/03/19  Yes [name], MD  esomeprazole (NEXIUM) 40 MG capsule Take 1 capsule (40 mg total) by mouth daily. 01/20/19  Yes [name], MD  ferrous sulfate 324 (65 Fe) MG TBEC Take 324 mg by mouth daily.    Yes [provider]  furosemide (LASIX) 20 MG tablet Take 1 tablet (20 mg total) by mouth daily. Take two tablets by mouth in the morning and 1 tablet at lunch Patient taking differently: Take 40 mg by mouth 2 (two) times  daily.  12/14/18  Yes [name], MD  hydrALAZINE (APRESOLINE) 50 MG tablet Take 1 tablet (50 mg total) by mouth 3 (three) times daily. 01/20/19  Yes Stacks, Cletus Gash, MD  hydrocortisone (ANUSOL-HC) 25 MG suppository Place 1 suppository (25 mg total) rectally 2 (two) times daily. 01/20/19  Yes Stacks, Cletus Gash, MD  insulin lispro (HUMALOG KWIKPEN) 100 UNIT/ML KiwkPen Inject 0.04-0.1 mLs (4-10 Units total) into the skin daily as needed (high blood sugar). Patient taking differently: Inject 5-15 Units into the skin 2 (two) times daily.  07/12/18  Yes [name], MD  isosorbide mononitrate (IMDUR) 30 MG 24 hr tablet Take 1 tablet (30 mg total) by mouth daily. 10/18/18  Yes Stacks, Cletus Gash, MD  lisinopril (PRINIVIL,ZESTRIL) 20 MG tablet Take 1 tablet (20 mg total) by mouth  every morning. 10/18/18  Yes [name], MD  meclizine (ANTIVERT) 25 MG tablet TAKE 1 TABLET TWICE DAILY AS NEEDED FOR DIZZINESS. Patient taking differently: Take 25 mg by mouth 2 (two) times daily.  01/02/19  Yes Stacks, Cletus Gash, MD  metFORMIN (GLUCOPHAGE-XR) 500 MG 24 hr tablet TAKE 1 TABLET 2 TIMES A DAY WITH BREAKFAST & DINNER. 02/03/19  Yes Stacks, Cletus Gash, MD  methenamine (HIPREX) 1 g tablet Take 1 tablet (1 g total) by mouth 2 (two) times daily. 12/14/18  Yes [name], MD  metoprolol tartrate (LOPRESSOR) 50 MG tablet Take 1 tablet (50 mg total) by mouth 2 (two) times daily. 01/03/19  Yes Stacks, Cletus Gash, MD  Multiple Vitamins-Iron (DAILY VITAMINS/IRON/BETA CAROT PO) Take 1 tablet by mouth at bedtime.   Yes [provider]  nitroGLYCERIN (NITROSTAT) 0.4 MG SL tablet PLACE ONE (1) TABLET UNDER TONGUE EVERY 5 MINUTES UP TO (3) DOSES AS NEEDED FOR CHEST PAIN. Patient taking differently: Place 0.4 mg under the tongue every 5 (five) minutes as needed for chest pain.  01/02/19  Yes [name], MD  PARoxetine (PAXIL) 20 MG tablet Take 1 tablet (20 mg total) by mouth every morning. 01/03/19  Yes Stacks, Cletus Gash, MD  polyethylene glycol [facility]) packet Take 17 g by mouth daily. Patient taking differently: Take 17 g by mouth daily as needed for mild constipation or moderate constipation.  07/25/18  Yes Maudie Flakes, MD  spironolactone (ALDACTONE) 25 MG tablet TAKE 2 TABLETS BY MOUTH EVERY MORNING. 02/13/19  Yes [name], MD  blood glucose meter kit and supplies KIT Dispense based on insurance coverage. Use 4 times a day as directed Dx: E11.9, Z79.4 06/13/18   Timmothy Euler, MD  FORA LANCETS MISC Test blood sugars twice daily 01/20/19   [name], MD  glucose blood (ACCU-CHEK AVIVA) test strip Use to check blood glucose twice a day 01/20/19   [name], MD  Insulin Pen Needle (GLOBAL EASE INJECT PEN NEEDLES) 31G X 8 MM MISC AS DIRECTED 01/20/19   [name], MD  Insulin  Syringe-Needle U-100 (EASY TOUCH INSULIN SYRINGE) 31G X 5/16" 1 ML MISC USE AS DIRECTED. 01/20/19   [name], MD  LEVEMIR FLEXTOUCH 100 UNIT/ML Pen INJECT [address] UNITS EACH MORNING 02/03/19   [name], MD    Allergies as of 11/24/2018 - Review Complete 11/23/2018  Allergen Reaction Noted  . Advil [ibuprofen] Swelling 10/17/2015  . Heparin Other (See Comments) 03/06/2015  . Propoxyphene n-acetaminophen Other (See Comments)     Family History  Problem Relation Age of Onset  . Cancer Brother        porstate  . Early death Sister   . Heart disease Brother   .  Heart disease Brother   . Heart attack Brother   . Heart disease Brother   . Heart attack Brother   . Diabetes Sister   . Heart attack Sister   . Diabetes Sister   . Osteoporosis Sister   . Hypertension Sister   . Heart attack Son   . Early death Son   . Pancreatitis Son   . Heart attack Daughter 64    Social History   Socioeconomic History  . Marital status: Widowed    Spouse name: Not on file  . Number of children: 4  . Years of education: 3  . Highest education level: 3rd grade  Occupational History  . Occupation: retired  Scientific laboratory technician  . Financial resource strain: Not very hard  . Food insecurity:    Worry: Never true    Inability: Never true  . Transportation needs:    Medical: No    Non-medical: No  Tobacco Use  . Smoking status: Never Smoker  . Smokeless tobacco: Never Used  Substance and Sexual Activity  . Alcohol use: No  . Drug use: No  . Sexual activity: Not Currently  Lifestyle  . Physical activity:    Days per week: 0 days    Minutes per session: 0 min  . Stress: To some extent  Relationships  . Social connections:    Talks on phone: More than three times a week    Gets together: More than three times a week    Attends religious service: More than 4 times per year    Active member of club or organization: No    Attends meetings of clubs or organizations: Never    Relationship  status: Widowed  . Intimate partner violence:    Fear of current or ex partner: No    Emotionally abused: No    Physically abused: No    Forced sexual activity: No  Other Topics Concern  . Not on file  Social History Narrative  . Not on file    Review of Systems: See HPI, otherwise negative ROS   Physical Exam: There were no vitals taken for this visit. General:   Alert,  pleasant and cooperative in NAD Head:  Normocephalic and atraumatic. Neck:  Supple; Lungs:  Clear throughout to auscultation.    Heart:  Regular rate and rhythm. Abdomen:  Soft, nontender and nondistended. Normal bowel sounds, without guarding, and without rebound.   Neurologic:  Alert and  oriented x4;  grossly normal neurologically.  Impression/Plan:     Abnormal CT scan: transverse coon/ BRBPR.   Plan:  TCS TODAY. DISCUSSED PROCEDURE, BENEFITS, & RISKS: < 1% chance of medication reaction, bleeding, perforation, or rupture of spleen/liver.

## 2019-02-14 NOTE — Op Note (Signed)
[facility] Patient Name: [name] Procedure Date: 02/14/2019 8:14 AM MRN: [id number] Date of Birth: March 22, 1931 Attending MD: Barney Drain MD, MD CSN: [id number] Age: 85 Admit Type: Outpatient Procedure:                Colonoscopy WITH COLD SNARE POLYPECOMY Indications:              Hematochezia, Abnormal CT of the GI tract Providers:                Barney Drain MD, MD, Rosina Lowenstein, RN Referring MD:             [name] Medicines:                Propofol per Anesthesia Complications:            No immediate complications. Estimated Blood Loss:     Estimated blood loss was minimal. Procedure:                Pre-Anesthesia Assessment:                           - Prior to the procedure, a History and Physical                            was performed, and patient medications and                            allergies were reviewed. The patient's tolerance of                            previous anesthesia was also reviewed. The risks                            and benefits of the procedure and the sedation                            options and risks were discussed with the patient.                            All questions were answered, and informed consent                            was obtained. Prior Anticoagulants: The patient                            last took aspirin 1 day and dipyridamole 1 day                            prior to the procedure. ASA Grade Assessment: II -                            A patient with mild systemic disease. After                            reviewing the risks and benefits, the patient was  deemed in satisfactory condition to undergo the                            procedure. After obtaining informed consent, the                            colonoscope was passed under direct vision.                            Throughout the procedure, the patient's blood                            pressure, pulse, and oxygen  saturations were                            monitored continuously. The PCF-[id number] ([id number])                            was introduced through the anus and advanced to the                            6 cm into the ileum. The colonoscopy was somewhat                            difficult due to a tortuous colon. Successful                            completion of the procedure was aided by                            straightening and shortening the scope to obtain                            bowel loop reduction and COLOWRAP. The patient                            tolerated the procedure well. The quality of the                            bowel preparation was good. The terminal ileum,                            ileocecal valve, appendiceal orifice, and rectum                            were photographed. Scope In: 8:43:06 AM Scope Out: 9:12:14 AM Scope Withdrawal Time: 0 hours 24 minutes 10 seconds  Total Procedure Duration: 0 hours 29 minutes 8 seconds  Findings:      A few small-mouthed diverticula were found in the ascending colon.      Multiple small and large-mouthed diverticula were found in the       recto-sigmoid colon and sigmoid colon.      A 6 mm polyp was found in the cecum. The polyp was sessile. The polyp  was removed with a cold snare. Resection and retrieval were complete.      Internal hemorrhoids were found. The hemorrhoids were small.      The recto-sigmoid colon, sigmoid colon and descending colon were       moderately tortuous. Impression:               - MILD Diverticulosis in the ascending colon.                           - MODERATE Diverticulosis in the recto-sigmoid                            colon and in the sigmoid colon.                           - One 6 mm polyp in the cecum, removed with a cold                            snare. Resected and retrieved.                           - RECTAL BLEEDING DUE TO Internal hemorrhoids.                           -  Tortuous LEFT colon.                           - NORMAL TRANSVERSE COLON. FINDINGS IN CT LIKLEY                            DUE TO UNDERDISTENSTION Moderate Sedation:      Per Anesthesia Care Recommendation:           - Patient has a contact number available for                            emergencies. The signs and symptoms of potential                            delayed complications were discussed with the                            patient. Return to normal activities tomorrow.                            Written discharge instructions were provided to the                            patient.                           - High fiber diet.                           - Continue present medications. USE MIRALAX 1-2  TIMES DAILY TO AVOID CONSTIPATION/STRANING.                            PREPARATION H PRN RECTAL BLEEDING OR PAIN.                           - Await pathology results.                           - Repeat colonoscopy is not recommended due to                            current age (73 years or older) for surveillance. Procedure Code(s):        --- Professional ---                           [phone], Colonoscopy, flexible; with removal of                            tumor(s), polyp(s), or other lesion(s) by snare                            technique Diagnosis Code(s):        --- Professional ---                           K64.8, Other hemorrhoids                           D12.0, Benign neoplasm of cecum                           K92.1, Melena (includes Hematochezia)                           K57.30, Diverticulosis of large intestine without                            perforation or abscess without bleeding                           R93.3, Abnormal findings on diagnostic imaging of                            other parts of digestive tract                           Q43.8, Other specified congenital malformations of                            intestine CPT  copyright 2018 American Medical Association. All rights reserved. The codes documented in this report are preliminary and upon coder review may  be revised to meet current compliance requirements. Barney Drain, MD Barney Drain MD, MD 02/14/2019 9:35:47 AM This report has been signed electronically. Number of Addenda: 0

## 2019-02-14 NOTE — Anesthesia Preprocedure Evaluation (Signed)
Anesthesia Evaluation  Patient identified by MRN, date of birth, ID band Patient awake    Reviewed: Allergy & Precautions, NPO status , Patient's Chart, lab work & pertinent test results, reviewed documented beta blocker date and time   Airway Mallampati: II  TM Distance: >3 FB Neck ROM: Full    Dental no notable dental hx. (+) Edentulous Lower, Edentulous Upper   Pulmonary asthma ,    Pulmonary exam normal breath sounds clear to auscultation       Cardiovascular Exercise Tolerance: Poor hypertension, Pt. on medications + CAD, + Past MI and +CHF  Normal cardiovascular examII Rhythm:Regular Rate:Normal     Neuro/Psych Anxiety Depression Dementia CVA, Residual Symptoms negative psych ROS   GI/Hepatic Neg liver ROS, GERD  Medicated and Controlled,  Endo/Other  negative endocrine ROSdiabetes  Renal/GU Renal InsufficiencyRenal disease  negative genitourinary   Musculoskeletal  (+) Arthritis , Osteoarthritis,    Abdominal   Peds negative pediatric ROS (+)  Hematology negative hematology ROS (+) anemia ,   Anesthesia Other Findings   Reproductive/Obstetrics negative OB ROS                             Anesthesia Physical Anesthesia Plan  ASA: III  Anesthesia Plan: General   Post-op Pain Management:    Induction: Intravenous  PONV Risk Score and Plan:   Airway Management Planned: Nasal Cannula and Simple Face Mask  Additional Equipment:   Intra-op Plan:   Post-operative Plan:   Informed Consent: I have reviewed the patients History and Physical, chart, labs and discussed the procedure including the risks, benefits and alternatives for the proposed anesthesia with the patient or authorized representative who has indicated his/her understanding and acceptance.     Dental advisory given  Plan Discussed with: CRNA  Anesthesia Plan Comments: (GA-d/w pt family  GETA as needed - not  planned )        Anesthesia Quick Evaluation

## 2019-02-14 NOTE — Anesthesia Postprocedure Evaluation (Signed)
Anesthesia Post Note  Patient: [name]  Procedure(s) Performed: COLONOSCOPY WITH PROPOFOL (N/A ) POLYPECTOMY  Patient location during evaluation: PACU Anesthesia Type: General Level of consciousness: awake and patient cooperative Pain management: pain level controlled Vital Signs Assessment: post-procedure vital signs reviewed and stable Respiratory status: spontaneous breathing, nonlabored ventilation and respiratory function stable Cardiovascular status: blood pressure returned to baseline Postop Assessment: no apparent nausea or vomiting Anesthetic complications: no     Last Vitals:  Vitals:   02/14/19 0817 02/14/19 0823  BP: (!) 198/81   Pulse:    Resp:    Temp:    SpO2:  99%    Last Pain:  Vitals:   02/14/19 0913  TempSrc:   PainSc: 0-No pain                 Robin Petrakis J

## 2019-02-15 ENCOUNTER — Encounter: Payer: Self-pay | Admitting: Cardiology

## 2019-02-15 ENCOUNTER — Ambulatory Visit (INDEPENDENT_AMBULATORY_CARE_PROVIDER_SITE_OTHER): Payer: Medicare Other | Admitting: Cardiology

## 2019-02-15 VITALS — BP 138/60 | HR 56 | Ht 66.0 in | Wt 148.0 lb

## 2019-02-15 DIAGNOSIS — E118 Type 2 diabetes mellitus with unspecified complications: Secondary | ICD-10-CM | POA: Diagnosis not present

## 2019-02-15 DIAGNOSIS — I10 Essential (primary) hypertension: Secondary | ICD-10-CM

## 2019-02-15 DIAGNOSIS — R0683 Snoring: Secondary | ICD-10-CM | POA: Diagnosis not present

## 2019-02-15 DIAGNOSIS — I5032 Chronic diastolic (congestive) heart failure: Secondary | ICD-10-CM

## 2019-02-15 DIAGNOSIS — R0681 Apnea, not elsewhere classified: Secondary | ICD-10-CM | POA: Diagnosis not present

## 2019-02-15 DIAGNOSIS — E785 Hyperlipidemia, unspecified: Secondary | ICD-10-CM

## 2019-02-15 NOTE — Patient Instructions (Signed)
Medication Instructions:  The current medical regimen is effective;  continue present plan and medications.  If you need a refill on your cardiac medications before your next appointment, please call your pharmacy.   Testing/Procedures: Your physician has recommended that you have a sleep study. This test records several body functions during sleep, including: brain activity, eye movement, oxygen and carbon dioxide blood levels, heart rate and rhythm, breathing rate and rhythm, the flow of air through your mouth and nose, snoring, body muscle movements, and chest and belly movement.  This will be completed at [place]: Follow up in 6 months with Dr. Percival Spanish in [place]. You will receive a letter in the mail 2 months before you are due.  Please call us when you receive this letter to schedule your follow up appointment.  Thank you for choosing [facility]!!

## 2019-02-17 ENCOUNTER — Encounter (HOSPITAL_COMMUNITY): Payer: Self-pay | Admitting: Gastroenterology

## 2019-02-20 ENCOUNTER — Telehealth: Payer: Self-pay | Admitting: Gastroenterology

## 2019-02-20 NOTE — Telephone Encounter (Signed)
PLEASE CALL PT. SHE HAD ONE SIMPLE ADENOMA REMOVED.   DRINK WATER EAT FIBER We do not routinely screen for polyps after the age of 24

## 2019-02-21 NOTE — Telephone Encounter (Signed)
PT's daughter, [name] is aware.

## 2019-02-21 NOTE — Telephone Encounter (Signed)
LMOM to call.

## 2019-02-28 ENCOUNTER — Telehealth: Payer: Self-pay | Admitting: Family Medicine

## 2019-02-28 NOTE — Telephone Encounter (Signed)
Spoke with pt and advised this catheter system is used in hospitals as it requires wall suction. Pt's daughter voiced understanding.

## 2019-02-28 NOTE — Telephone Encounter (Signed)
Please  write and I will sign. Thanks, WS 

## 2019-03-05 ENCOUNTER — Other Ambulatory Visit: Payer: Self-pay | Admitting: Family Medicine

## 2019-03-31 ENCOUNTER — Other Ambulatory Visit: Payer: Self-pay | Admitting: Family Medicine

## 2019-03-31 DIAGNOSIS — Z794 Long term (current) use of insulin: Secondary | ICD-10-CM

## 2019-03-31 DIAGNOSIS — E119 Type 2 diabetes mellitus without complications: Secondary | ICD-10-CM

## 2019-03-31 DIAGNOSIS — F411 Generalized anxiety disorder: Secondary | ICD-10-CM

## 2019-04-17 ENCOUNTER — Telehealth: Payer: Self-pay | Admitting: Family Medicine

## 2019-04-20 ENCOUNTER — Ambulatory Visit (INDEPENDENT_AMBULATORY_CARE_PROVIDER_SITE_OTHER): Payer: Medicare Other | Admitting: Family Medicine

## 2019-04-20 ENCOUNTER — Other Ambulatory Visit: Payer: Self-pay

## 2019-04-20 DIAGNOSIS — Z794 Long term (current) use of insulin: Secondary | ICD-10-CM | POA: Diagnosis not present

## 2019-04-20 DIAGNOSIS — I10 Essential (primary) hypertension: Secondary | ICD-10-CM | POA: Diagnosis not present

## 2019-04-20 DIAGNOSIS — M1990 Unspecified osteoarthritis, unspecified site: Secondary | ICD-10-CM | POA: Diagnosis not present

## 2019-04-20 DIAGNOSIS — E119 Type 2 diabetes mellitus without complications: Secondary | ICD-10-CM | POA: Diagnosis not present

## 2019-04-20 DIAGNOSIS — N183 Chronic kidney disease, stage 3 unspecified: Secondary | ICD-10-CM

## 2019-04-20 MED ORDER — NABUMETONE 500 MG PO TABS: 500.0000 mg | ORAL_TABLET | Freq: Two times a day (BID) | ORAL | 2 refills | Status: DC

## 2019-04-20 NOTE — Progress Notes (Signed)
Subjective:  [name], female    DOB: 1931/02/01  Age: 85 y.o. MRN: [id number]  CC: No chief complaint on file.   HPI [name] presents forFollow-up of diabetes. Patient checks blood sugar at home.   120 fasting. Patient denies symptoms such as polyuria, polydipsia, excessive hunger, nausea No significant hypoglycemic spells noted. Medications reviewed. Pt reports taking them regularly without complication/adverse reaction being reported today.  Checking feet daily. BP this AM 148/70. O2 sat 95.Wt. 147 . History [name] has a past medical history of Anemia, Anxiety, Aortic stenosis, Arthritis, Asthma, Balance problems, CAD (coronary artery disease), Carotid stenosis, CHF (congestive heart failure) ([place]), CKD (chronic kidney disease), CVA (cerebral infarction), Dementia ([place]), Depression, Diabetes mellitus, GERD (gastroesophageal reflux disease), Hypertension, Iron deficiency anemia (05/27/2011), Myocardial infarction [facility]), Renal insufficiency, Scoliosis, Vertigo, and Vision loss.   She has a past surgical history that includes I & D of [place] (April 2013); Coronary artery bypass graft (2006); Coronary angioplasty; Total abdominal hysterectomy (~85 years old); Cholecystectomy; Appendectomy; Cataract extraction (Bilateral, 5 years ago); Total knee arthroplasty (Right, 08/13/2015); Knee surgery (Right); Colonoscopy (2005); Colonoscopy with propofol (N/A, 02/14/2019); and polypectomy (02/14/2019).   Her family history includes Cancer in her brother; Diabetes in her sister and sister; Early death in her sister and son; Heart attack in her brother, brother, sister, and son; Heart attack (age of onset: 84) in her daughter; Heart disease in her brother, brother, and brother; Hypertension in her sister; Osteoporosis in her sister; Pancreatitis in her son.She reports that she has never smoked. She has never used smokeless tobacco. She reports that she does not drink alcohol or use  drugs.  Current Outpatient Medications on File Prior to Visit  Medication Sig Dispense Refill   albuterol (PROVENTIL HFA;VENTOLIN HFA) 108 (90 Base) MCG/ACT inhaler Inhale 2 puffs into the lungs every 6 (six) hours as needed for wheezing or shortness of breath. 1 Inhaler 5   amLODipine (NORVASC) 10 MG tablet TAKE 1 TABLET DAILY. 30 tablet 5   Artificial Tear Ointment (DRY EYES OP) Apply 1 drop to eye daily as needed (dry/red eyes).      aspirin 81 MG chewable tablet Chew 81 mg by mouth at bedtime.     atorvastatin (LIPITOR) 20 MG tablet Take 1 tablet (20 mg total) by mouth at bedtime. 90 tablet 3   blood glucose meter kit and supplies KIT Dispense based on insurance coverage. Use 4 times a day as directed Dx: E11.9, Z79.4 1 each 0   budesonide-formoterol (SYMBICORT) 80-4.5 MCG/ACT inhaler Inhale 2 puffs into the lungs 2 (two) times daily. (Patient taking differently: Inhale 2 puffs into the lungs 2 (two) times daily as needed (shortness of breath or wheezing). ) 1 Inhaler 3   clopidogrel (PLAVIX) 75 MG tablet TAKE 1 TABLET DAILY. 30 tablet 5   esomeprazole (NEXIUM) 40 MG capsule Take 1 capsule (40 mg total) by mouth daily. 90 capsule 1   ferrous sulfate 324 (65 Fe) MG TBEC Take 324 mg by mouth daily.      FORA LANCETS MISC Test blood sugars twice daily 200 each 3   furosemide (LASIX) 20 MG tablet 20 mg. Take 2 tablets by mouth in the morning     glucose blood (ACCU-CHEK AVIVA) test strip Use to check blood glucose twice a day 200 each 3   hydrALAZINE (APRESOLINE) 50 MG tablet TAKE 1 TABLET BY MOUTH 3 TIMES DAILY. 90 tablet 0   hydrocortisone (ANUSOL-HC) 25 MG suppository Place  1 suppository (25 mg total) rectally 2 (two) times daily. 12 suppository 5   insulin lispro (HUMALOG KWIKPEN) 100 UNIT/ML KiwkPen Inject 0.04-0.1 mLs (4-10 Units total) into the skin daily as needed (high blood sugar). (Patient taking differently: Inject 5-15 Units into the skin 2 (two) times daily. ) 15 mL 5    Insulin Pen Needle (EASY TOUCH PEN NEEDLES) 31G X 8 MM MISC Use daily with insulin Dx E11.9 100 each 3   Insulin Syringe-Needle U-100 (EASY TOUCH INSULIN SYRINGE) 31G X 5/16" 1 ML MISC USE AS DIRECTED. 80 each 2   isosorbide mononitrate (IMDUR) 30 MG 24 hr tablet Take 1 tablet (30 mg total) by mouth daily. 90 tablet 3   LEVEMIR FLEXTOUCH 100 UNIT/ML Pen INJECT 24 UNITS EACH MORNING 6 mL 1   lisinopril (PRINIVIL,ZESTRIL) 20 MG tablet Take 1 tablet (20 mg total) by mouth every morning. 90 tablet 2   meclizine (ANTIVERT) 25 MG tablet TAKE 1 TABLET TWICE DAILY AS NEEDED FOR DIZZINESS. (Patient taking differently: Take 25 mg by mouth 2 (two) times daily. ) 60 tablet 2   metFORMIN (GLUCOPHAGE-XR) 500 MG 24 hr tablet TAKE 1 TABLET 2 TIMES A DAY WITH BREAKFAST & DINNER. 180 tablet 0   methenamine (HIPREX) 1 g tablet Take 1 tablet (1 g total) by mouth 2 (two) times daily. 60 tablet 5   metoprolol tartrate (LOPRESSOR) 50 MG tablet Take 1 tablet (50 mg total) by mouth 2 (two) times daily. 180 tablet 2   Multiple Vitamins-Iron (DAILY VITAMINS/IRON/BETA CAROT PO) Take 1 tablet by mouth at bedtime.     nitroGLYCERIN (NITROSTAT) 0.4 MG SL tablet PLACE (1) TABLET UNDER TONGUE EVERY 5 MINUTES UP TO (3) DOSES. IF NO RELIEF CALL 911. 25 tablet 0   omeprazole (PRILOSEC) 20 MG capsule 1 PO 30 MINS PRIOR TO BREAKFAST. 90 capsule 3   PARoxetine (PAXIL) 20 MG tablet TAKE 1 TABLET BY MOUTH EVERY MORNING. 90 tablet 0   polyethylene glycol (MIRALAX) packet Take 17 g by mouth daily. (Patient taking differently: Take 17 g by mouth daily as needed for mild constipation or moderate constipation. ) 30 each 0   spironolactone (ALDACTONE) 25 MG tablet TAKE 2 TABLETS BY MOUTH EVERY MORNING. 30 tablet 4   No current facility-administered medications on file prior to visit.     ROS Review of Systems  Constitutional: Negative.   HENT: Negative for congestion.   Eyes: Negative for visual disturbance.    Respiratory: Negative for shortness of breath.   Cardiovascular: Positive for leg swelling (intermittent, a little bit). Negative for chest pain.  Gastrointestinal: Negative for abdominal pain, constipation, diarrhea, nausea and vomiting.  Genitourinary: Positive for flank pain (a little on the left. Relief with aleve. ). Negative for difficulty urinating.  Musculoskeletal: Positive for arthralgias (legs and feet). Negative for myalgias.  Neurological: Negative for headaches.  Psychiatric/Behavioral: Negative for sleep disturbance.    Objective:  There were no vitals taken for this visit.  BP Readings from Last 3 Encounters:  02/15/19 138/60  02/14/19 (!) 171/72  02/08/19 (!) 142/55    Wt Readings from Last 3 Encounters:  02/15/19 148 lb (67.1 kg)  02/08/19 151 lb 6.4 oz (68.7 kg)  02/03/19 150 lb 2 oz (68.1 kg)     Physical Exam  Exam deferred. Pt. [place] due to COVID 19. Phone visit performed.   Assessment & Plan:   Diagnoses and all orders for this visit:  Type 2 diabetes mellitus with insulin therapy ([place])  Essential hypertension  CKD (chronic kidney disease) stage 3, GFR 30-59 ml/min (H[facility])  Arthritis  Other orders -     nabumetone (RELAFEN) 500 MG tablet; Take 1 tablet (500 mg total) by mouth 2 (two) times daily. For muscle and joint pain      I am having Leva G. Markman start on nabumetone. I am also having her maintain her Artificial Tear Ointment (DRY EYES OP), Multiple Vitamins-Iron (DAILY VITAMINS/IRON/BETA CAROT PO), aspirin, blood glucose meter kit and supplies, insulin lispro, polyethylene glycol, lisinopril, isosorbide mononitrate, atorvastatin, ferrous sulfate, methenamine, meclizine, metoprolol tartrate, albuterol, budesonide-formoterol, esomeprazole, hydrocortisone, FORA Lancets, glucose blood, Insulin Syringe-Needle U-100, amLODipine, clopidogrel, spironolactone, omeprazole, furosemide, Levemir FlexTouch, Insulin Pen Needle, nitroGLYCERIN,  PARoxetine, hydrALAZINE, and metFORMIN.  Meds ordered this encounter  Medications   nabumetone (RELAFEN) 500 MG tablet    Sig: Take 1 tablet (500 mg total) by mouth 2 (two) times daily. For muscle and joint pain    Dispense:  60 tablet    Refill:  2   Virtual Visit via telephone Note  I discussed the limitations, risks, security and privacy concerns of performing an evaluation and management service by telephone and the availability of in person appointments. I also discussed with the patient that there may be a patient responsible charge related to this service. The patient expressed understanding and agreed to proceed. Pt. Is at home. Dr. Livia Snellen is in his office.  Follow Up Instructions:   I discussed the assessment and treatment plan with the patient. The patient was provided an opportunity to ask questions and all were answered. The patient agreed with the plan and demonstrated an understanding of the instructions.   The patient was advised to call back or seek an in-person evaluation if the symptoms worsen or if the condition fails to improve as anticipated.  Total minutes including chart review and phone contact time: 20   Follow-up: Return in about 3 months (around 07/21/2019), or if symptoms worsen or fail to improve, for diabetes, Arthritis, hypertension.  [name], M.D.

## 2019-04-23 ENCOUNTER — Encounter: Payer: Self-pay | Admitting: Family Medicine

## 2019-04-24 ENCOUNTER — Encounter: Payer: Medicare Other | Admitting: *Deleted

## 2019-04-28 ENCOUNTER — Ambulatory Visit (INDEPENDENT_AMBULATORY_CARE_PROVIDER_SITE_OTHER): Payer: Medicare Other | Admitting: *Deleted

## 2019-04-28 ENCOUNTER — Other Ambulatory Visit: Payer: Self-pay

## 2019-04-28 ENCOUNTER — Encounter: Payer: Self-pay | Admitting: *Deleted

## 2019-04-28 DIAGNOSIS — Z0000 Encounter for general adult medical examination without abnormal findings: Secondary | ICD-10-CM

## 2019-04-28 NOTE — Patient Instructions (Signed)
  [name] , Thank you for taking time to talk with me for your Medicare Wellness Visit. I appreciate your ongoing commitment to your health goals. Please review the following plan we discussed and let me know if I can assist you in the future.   These are the goals we discussed: Goals    . Exercise 150 minutes per week (moderate activity)     Do chair exercises daily and walk for 10 to 15 minutes a day as tolerated    . Prevent falls     Move carefully to avoid falls. Use your walker.        This is a list of the screening recommended for you and due dates:  Health Maintenance  Topic Date Due  . Pneumonia vaccines (1 of 2 - PCV13) 10/20/1998  . Eye exam for diabetics  05/19/2017  . Complete foot exam   09/04/2017  . Flu Shot  07/08/2019  . Hemoglobin A1C  07/21/2019  . Mammogram  09/06/2019  . Tetanus Vaccine  08/24/2021  . DEXA scan (bone density measurement)  Discontinued

## 2019-04-28 NOTE — Progress Notes (Signed)
MEDICARE ANNUAL WELLNESS VISIT  04/28/2019  Telephone Visit Disclaimer This Medicare AWV was conducted by telephone due to national recommendations for restrictions regarding the COVID-19 Pandemic (e.g. social distancing).  I verified, using two identifiers, that I am speaking with [name] or their authorized healthcare agent. I discussed the limitations, risks, security, and privacy concerns of performing an evaluation and management Figueroa by telephone and the potential availability of an in-person appointment in the future. The patient expressed understanding and agreed to proceed.   Subjective:  [name] is a 85 y.o. female patient of Stacks, Cletus Gash, MD who had a Medicare Annual Wellness Visit today via telephone. [name] is retired and lives with her daughter [name]. History was mostly provided by Al[place] today.  She had 4 children, 1 is deceased, and 1 grandchild. [name] is widowed.  She reports that she is socially active and does interact with friends/family regularly. She plans to go back to church with COVID 19 restrictions are lifted.  She is minimally physically active and enjoys playing with her dog, planting flowers, and being outside.  Patient Care Team: [name], MD as PCP - General (Family Medicine) Minus Breeding, MD as PCP - Cardiology (Cardiology) Steffanie Rainwater, DPM as Consulting Physician (Podiatry) Williams Che, MD (Inactive) as Consulting Physician (Ophthalmology) Danie Binder, MD as Consulting Physician (Gastroenterology) Herminio Commons, MD as Attending Physician (Cardiology)  Advanced Directives 04/28/2019 02/14/2019 02/08/2019 01/20/2019 10/07/2018 10/07/2018 07/25/2018  Does Patient Have a Medical Advance Directive? Yes Yes Yes Yes No No Yes  Type of Advance Directive Living will Living will Living will Living will - - Living will  Does patient want to make changes to medical advance directive? - - No - Patient declined No - Patient  declined - - -  Copy of [place] in [place]  Would patient like information on creating a medical advance directive? - No - Patient declined No - Patient declined No - Patient declined No - Patient declined - -    Hospital Utilization Over the Past 12 Months: # of hospitalizations or ER visits: 3 ER visits and 1 hospitalization for CHF exacerbations and rectal bleeding # of surgeries: 1 colonoscopy in the past year  Review of Systems    Patient reports that her overall health is unchanged compared to last year.     Review of Systems:   Musculoskeletal - positive for back pain  All other systems negative.  Pain Assessment Pain Score: 8      Current Medications & Allergies (verified) Allergies as of 04/28/2019      Reactions   Advil [ibuprofen] Swelling   Heparin Other (See Comments)   Confusion, hallucinations.    Propoxyphene N-acetaminophen Other (See Comments)   Numbness all over. "floating" sensation.      Medication List       Accurate as of Apr 28, 2019  3:54 PM. If you have any questions, ask your nurse or doctor.        albuterol 108 (90 Base) MCG/ACT inhaler Commonly known as:  VENTOLIN HFA Inhale 2 puffs into the lungs every 6 (six) hours as needed for wheezing or shortness of breath.   amLODipine 10 MG tablet Commonly known as:  NORVASC TAKE 1 TABLET DAILY.   aspirin 81 MG chewable tablet Chew 81 mg by mouth at bedtime.   atorvastatin 20 MG tablet Commonly known as:  LIPITOR Take  1 tablet (20 mg total) by mouth at bedtime.   blood glucose meter kit and supplies Kit Dispense based on insurance coverage. Use 4 times a day as directed Dx: E11.9, Z79.4   budesonide-formoterol 80-4.5 MCG/ACT inhaler Commonly known as:  SYMBICORT Inhale 2 puffs into the lungs 2 (two) times daily. What changed:    when to take this  reasons to take this   clopidogrel 75 MG tablet Commonly known as:  PLAVIX TAKE 1 TABLET DAILY.    DAILY VITAMINS/IRON/BETA CAROT PO Take 1 tablet by mouth at bedtime.   DRY EYES OP Apply 1 drop to eye daily as needed (dry/red eyes).   esomeprazole 40 MG capsule Commonly known as:  NEXIUM Take 1 capsule (40 mg total) by mouth daily.   ferrous sulfate 324 (65 Fe) MG Tbec Take 324 mg by mouth daily.   FORA Lancets Misc Test blood sugars twice daily   furosemide 20 MG tablet Commonly known as:  LASIX 20 mg. Take 2 tablets by mouth in the morning   glucose blood test strip Commonly known as:  Accu-Chek Aviva Use to check blood glucose twice a day   hydrALAZINE 50 MG tablet Commonly known as:  APRESOLINE TAKE 1 TABLET BY MOUTH 3 TIMES DAILY.   hydrocortisone 25 MG suppository Commonly known as:  ANUSOL-HC Place 1 suppository (25 mg total) rectally 2 (two) times daily.   insulin lispro 100 UNIT/ML KiwkPen Commonly known as:  HumaLOG KwikPen Inject 0.04-0.1 mLs (4-10 Units total) into the skin daily as needed (high blood sugar). What changed:    how much to take  when to take this   Insulin Pen Needle 31G X 8 MM Misc Commonly known as:  Easy Touch Pen Needles Use daily with insulin Dx E11.9   Insulin Syringe-Needle U-100 31G X 5/16" 1 ML Misc Commonly known as:  Easy Touch Insulin Syringe USE AS DIRECTED.   isosorbide mononitrate 30 MG 24 hr tablet Commonly known as:  IMDUR Take 1 tablet (30 mg total) by mouth daily.   Levemir FlexTouch 100 UNIT/ML Pen Generic drug:  Insulin Detemir INJECT 24 UNITS EACH MORNING   lisinopril 20 MG tablet Commonly known as:  ZESTRIL Take 1 tablet (20 mg total) by mouth every morning.   meclizine 25 MG tablet Commonly known as:  ANTIVERT TAKE 1 TABLET TWICE DAILY AS NEEDED FOR DIZZINESS. What changed:  See the new instructions.   metFORMIN 500 MG 24 hr tablet Commonly known as:  GLUCOPHAGE-XR TAKE 1 TABLET 2 TIMES A DAY WITH BREAKFAST & DINNER.   methenamine 1 g tablet Commonly known as:  HIPREX Take 1 tablet (1 g  total) by mouth 2 (two) times daily.   metoprolol tartrate 50 MG tablet Commonly known as:  LOPRESSOR Take 1 tablet (50 mg total) by mouth 2 (two) times daily.   nabumetone 500 MG tablet Commonly known as:  RELAFEN Take 1 tablet (500 mg total) by mouth 2 (two) times daily. For muscle and joint pain   nitroGLYCERIN 0.4 MG SL tablet Commonly known as:  NITROSTAT PLACE (1) TABLET UNDER TONGUE EVERY 5 MINUTES UP TO (3) DOSES. IF NO RELIEF CALL 911.   omeprazole 20 MG capsule Commonly known as:  PRILOSEC 1 PO 30 MINS PRIOR TO BREAKFAST.   PARoxetine 20 MG tablet Commonly known as:  PAXIL TAKE 1 TABLET BY MOUTH EVERY MORNING.   polyethylene glycol 17 g packet Commonly known as:  MiraLax Take 17 g by mouth daily. What changed:  when to take this  reasons to take this   spironolactone 25 MG tablet Commonly known as:  ALDACTONE TAKE 2 TABLETS BY MOUTH EVERY MORNING.       History (reviewed): Past Medical History:  Diagnosis Date  . Anemia   . Anxiety   . Aortic stenosis   . Arthritis    back, knees, and hips  . Asthma    with allergies  . Balance problems   . CAD (coronary artery disease)   . Carotid stenosis   . CHF (congestive heart failure) ([place])    EF preserved Echo 2012  . CKD (chronic kidney disease)   . 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 Dementia ([place])    "a little"  . Depression   . Diabetes mellitus    type 2  . GERD (gastroesophageal reflux disease)   . Hypertension   . Iron deficiency anemia 05/27/2011  . Myocardial infarction ([place])   . Renal insufficiency   . Scoliosis   . Vertigo    "when sugar gets low"  . Vision loss    left eye-"half blind"   Past Surgical History:  Procedure Laterality Date  . APPENDECTOMY     with hysterectomy  . CATARACT EXTRACTION Bilateral 5 years ago  . CHOLECYSTECTOMY    . COLONOSCOPY  2005   Dr. Amedeo Plenty: Diverticulosis   . COLONOSCOPY WITH PROPOFOL N/A 02/14/2019   Procedure: COLONOSCOPY WITH PROPOFOL;  Surgeon: Danie Binder, MD;  Location: [facility];  Figueroa: Endoscopy;  Laterality: N/A;  . CORONARY ANGIOPLASTY     prior to 2006 5 stents  . CORONARY ARTERY BYPASS GRAFT  2006  . I & D of [place]  April 2013  . KNEE SURGERY Right   . POLYPECTOMY  02/14/2019   Procedure: POLYPECTOMY;  Surgeon: Danie Binder, MD;  Location: [facility];  Figueroa: Endoscopy;;  cecal polyp  . TOTAL ABDOMINAL HYSTERECTOMY  ~85 years old   complete, with tumor removal  . TOTAL KNEE ARTHROPLASTY Right 08/13/2015   Procedure: RIGHT  TOTAL KNEE ARTHROPLASTY;  Surgeon: Paralee Cancel, MD;  Location: [facility];  Figueroa: Orthopedics;  Laterality: Right;   Family History  Problem Relation Age of Onset  . Cancer Brother        porstate  . Early death Sister   . Heart disease Brother   . Heart disease Brother   . Heart attack Brother   . Heart disease Brother   . Heart attack Brother   . Diabetes Sister   . Heart attack Sister   . Diabetes Sister   . Osteoporosis Sister   . Hypertension Sister   . Heart attack Son   . Early death Son   . Pancreatitis Son   . Heart attack Daughter 36   Social History   Socioeconomic History  . Marital status: Widowed    Spouse name: Not on file  . Number of children: 4  . Years of education: 3  . Highest education level: 3rd grade  Occupational History  . Occupation: retired  Scientific laboratory technician  . Financial resource strain: Not very hard  . Food insecurity:    Worry: Never true    Inability: Never true  . Transportation needs:    Medical: No    Non-medical: No  Tobacco Use  . Smoking status: Never Smoker  . Smokeless tobacco: Never Used  Substance and Sexual  Activity  . Alcohol use: No  . Drug use: No  . Sexual activity: Not Currently  Lifestyle  . Physical activity:    Days per week: 0 days    Minutes per session: 0 min  . Stress: To some extent  Relationships  .  Social connections:    Talks on phone: More than three times a week    Gets together: More than three times a week    Attends religious Figueroa: More than 4 times per year    Active member of club or organization: No    Attends meetings of clubs or organizations: Never    Relationship status: Widowed  Other Topics Concern  . Not on file  Social History Narrative  . Not on file    Activities of Daily Living In your present state of health, do you have any difficulty performing the following activities: 04/28/2019 02/08/2019  Hearing? Tempie Donning  Comment Not interested in audiology referral at this time -  Vision? Y Y  Comment deficit since having stroke -  Difficulty concentrating or making decisions? Y Y  Comment Trouble remembering -  Walking or climbing stairs? Tempie Donning  Comment Avoids climbing stairs, uses walker for balance  -  Dressing or bathing? N N  Doing errands, shopping? Y N  Comment Family provides transporation -  Conservation officer, nature and eating ? N -  Using the Toilet? N -  In the past six months, have you accidently leaked urine? Y -  Comment Wears incontinence pad -  Do you have problems with loss of bowel control? N -  Managing your Medications? Y -  Comment Daughter helps -  Managing your Finances? Y -  Comment Daughter helps -  Housekeeping or managing your Housekeeping? Y -  Comment Daughter helps -  Some recent data might be hidden        Exercise Current Exercise Habits: The patient does not participate in regular exercise at present, Exercise limited by: orthopedic condition(s)  Diet Patient reports consuming 3 meals a day and 1 snack(s) a day Patient reports that her primary diet is: Regular Patient reports that she does have regular access to food.   Depression Screen PHQ 2/9 Scores 04/28/2019 02/03/2019 01/20/2019 01/03/2019 12/14/2018 11/23/2018 11/16/2018  PHQ - 2 Score 1 2 2 2 2  0 2  PHQ- 9 Score - 6 6 4 4  - 9  Exception Documentation - - - - - - -  Not  completed - - - - - - -     Fall Risk Fall Risk  04/28/2019 02/03/2019 01/31/2019 01/20/2019 01/03/2019  Falls in the past year? 1 1 1  0 0  Comment - - - - -  Number falls in past yr: 0 0 0 - -  Injury with Fall? 0 1 1 - -  Comment - - facial swelling - -  Risk Factor Category  - - - - -  Risk for fall due to : - Impaired mobility;Impaired balance/gait - - -  Follow up Falls prevention discussed;Education provided - - - -  Comment - - - - -     Objective:  [name] seemed alert and oriented and she participated appropriately during our telephone visit.  Blood Pressure Weight BMI  BP Readings from Last 3 Encounters:  02/15/19 138/60  02/14/19 (!) 171/72  02/08/19 (!) 142/55   Wt Readings from Last 3 Encounters:  02/15/19 148 lb (67.1 kg)  02/08/19 151 lb 6.4 oz (68.7  kg)  02/03/19 150 lb 2 oz (68.1 kg)   BMI Readings from Last 1 Encounters:  02/15/19 23.89 kg/m    *Unable to obtain current vital signs, weight, and BMI due to telephone visit type  Hearing/Vision  . [name] did  seem to have difficulty with hearing/understanding during the telephone conversation . Reports that she has not had a formal eye exam by an eye care professional within the past year . Reports that she has not had a formal hearing evaluation within the past year *Unable to fully assess hearing and vision during telephone visit type  Cognitive Function: 6CIT Screen 04/28/2019  What Year? 0 points  What month? 0 points  What time? 0 points  Count back from 20 4 points  Months in reverse 4 points  Repeat phrase 4 points  Total Score 12    Normal Cognitive Function Screening: No: patient had difficulty completing 6 CIT  (Normal:0-7, Significant for Dysfunction: >8)  Immunization & Health Maintenance Record Immunization History  Administered Date(s) Administered  . Tdap 08/25/2011    Health Maintenance  Topic Date Due  . PNA vac Low Risk Adult (1 of 2 - PCV13) 10/20/1998  . OPHTHALMOLOGY  EXAM  05/19/2017  . FOOT EXAM  09/04/2017  . INFLUENZA VACCINE  07/08/2019  . HEMOGLOBIN A1C  07/21/2019  . MAMMOGRAM  09/06/2019  . TETANUS/TDAP  08/24/2021  . DEXA SCAN  Discontinued       Assessment  This is a routine wellness examination for [name].  Health Maintenance: Due or Overdue Health Maintenance Due  Topic Date Due  . PNA vac Low Risk Adult (1 of 2 - PCV13) 10/20/1998  . OPHTHALMOLOGY EXAM  05/19/2017  . FOOT EXAM  09/04/2017    Recommend Prevnar 13 and Shingrix vaccines in the future. Patient's daughter plans to take her for eye exam when COVID 19 restrictions are lifted Recommend Foot exam at next visit with Dr. Livia Snellen.  [name] does not need a referral for [employer] Assistance: Care Management:   no Social Work:    no Prescription Assistance:  no Nutrition/Diabetes Education:  no   Plan:  Personalized Goals Goals Addressed            This Visit's Progress   . Exercise 150 minutes per week (moderate activity)   Not on track    Do chair exercises daily and walk for 10 to 15 minutes a day as tolerated    . Prevent falls   On track    Move carefully to avoid falls. Use your walker.       Personalized Health Maintenance & Screening Recommendations  Pneumococcal vaccine  Glaucoma screening Advanced directives: has an advanced directive - a copy HAS NOT been provided.  Recommend Shingrix vaccine in future.   Lung Cancer Screening Recommended: not applicable (Low Dose CT Chest recommended if Age 35-80 years, 30 pack-year currently smoking OR have quit w/in past 15 years) Hepatitis C Screening recommended: not applicable   Advanced Directives: Written information was not prepared per patient's request.  Referrals & Orders N/A  Follow-up Plan . Follow-up with [name], MD as planned . Schedule Eye exam . At your convenience, please bring a copy of your Advance Directives (Healthcare Power of Attorney and Living Will) to our  office to be filed in your medical record. . Continue to work on your goal of preventing falls.  . Work on doing chair exercises at least 3 times per week  I have personally reviewed and noted the following in the patient's chart:   . Medical and social history . Use of alcohol, tobacco or illicit drugs  . Current medications and supplements . Functional ability and status . Nutritional status . Physical activity . Advanced directives . List of other physicians . Hospitalizations, surgeries, and ER visits in previous 12 months . Vitals . Screenings to include cognitive, depression, and falls . Referrals and appointments  In addition, I have reviewed and discussed with [name] certain preventive protocols, quality metrics, and best practice recommendations. A written personalized care plan for preventive services as well as general preventive health recommendations is available and can be mailed to the patient at her request.      Nolberto Hanlon, RN  04/28/2019

## 2019-05-07 ENCOUNTER — Other Ambulatory Visit: Payer: Self-pay | Admitting: Family Medicine

## 2019-05-07 DIAGNOSIS — E119 Type 2 diabetes mellitus without complications: Secondary | ICD-10-CM

## 2019-05-25 ENCOUNTER — Other Ambulatory Visit: Payer: Self-pay | Admitting: Family Medicine

## 2019-05-25 DIAGNOSIS — K219 Gastro-esophageal reflux disease without esophagitis: Secondary | ICD-10-CM

## 2019-06-17 ENCOUNTER — Other Ambulatory Visit: Payer: Self-pay | Admitting: Family Medicine

## 2019-06-22 ENCOUNTER — Telehealth: Payer: Self-pay | Admitting: Family Medicine

## 2019-06-22 NOTE — Chronic Care Management (AMB) (Signed)
Chronic Care Management   Note  06/22/2019 Name: [name] MRN: [id number] DOB: 1931-10-14  [name] is a 85 y.o. year old female who is a primary care patient of Stacks, Cletus Gash, MD. I reached out to [employer] by phone today in response to a referral sent by [name]'s health plan.    [name] was given information about Chronic Care Management services today including:  1. CCM service includes personalized support from designated clinical staff supervised by her physician, including individualized plan of care and coordination with other care providers 2. 24/7 contact phone numbers for assistance for urgent and routine care needs. 3. Service will only be billed when office clinical staff spend 20 minutes or more in a month to coordinate care. 4. Only one practitioner may furnish and bill the service in a calendar month. 5. The patient may stop CCM services at any time (effective at the end of the month) by phone call to the office staff. 6. The patient will be responsible for cost sharing (co-pay) of up to 20% of the service fee (after annual deductible is met).  Patient did not agree to services and wishes to consider information provided before deciding about enrollment in care management services.   Follow up plan: The patient has been provided with contact information for the chronic care management team and has been advised to call with any health related questions or concerns.   [place]  [email or web address]   ??[phone]

## 2019-06-28 ENCOUNTER — Other Ambulatory Visit: Payer: Self-pay

## 2019-06-28 ENCOUNTER — Emergency Department (HOSPITAL_COMMUNITY): Payer: Medicare Other

## 2019-06-28 ENCOUNTER — Encounter (HOSPITAL_COMMUNITY): Payer: Self-pay | Admitting: Emergency Medicine

## 2019-06-28 ENCOUNTER — Inpatient Hospital Stay (HOSPITAL_COMMUNITY)
Admission: EM | Admit: 2019-06-28 | Discharge: 2019-07-02 | DRG: 683 | Disposition: A | Discharge status: Home Health | Payer: Medicare Other | Attending: Internal Medicine | Admitting: Internal Medicine

## 2019-06-28 DIAGNOSIS — I130 Hypertensive heart and chronic kidney disease with heart failure and stage 1 through stage 4 chronic kidney disease, or unspecified chronic kidney disease: Secondary | ICD-10-CM | POA: Diagnosis not present

## 2019-06-28 DIAGNOSIS — I69322 Dysarthria following cerebral infarction: Secondary | ICD-10-CM | POA: Diagnosis not present

## 2019-06-28 DIAGNOSIS — N179 Acute kidney failure, unspecified: Secondary | ICD-10-CM | POA: Diagnosis not present

## 2019-06-28 DIAGNOSIS — Z7982 Long term (current) use of aspirin: Secondary | ICD-10-CM

## 2019-06-28 DIAGNOSIS — R0902 Hypoxemia: Secondary | ICD-10-CM | POA: Diagnosis not present

## 2019-06-28 DIAGNOSIS — R531 Weakness: Secondary | ICD-10-CM

## 2019-06-28 DIAGNOSIS — Z794 Long term (current) use of insulin: Secondary | ICD-10-CM

## 2019-06-28 DIAGNOSIS — I5032 Chronic diastolic (congestive) heart failure: Secondary | ICD-10-CM | POA: Diagnosis not present

## 2019-06-28 DIAGNOSIS — R0689 Other abnormalities of breathing: Secondary | ICD-10-CM | POA: Diagnosis not present

## 2019-06-28 DIAGNOSIS — I4510 Unspecified right bundle-branch block: Secondary | ICD-10-CM | POA: Diagnosis not present

## 2019-06-28 DIAGNOSIS — F419 Anxiety disorder, unspecified: Secondary | ICD-10-CM | POA: Diagnosis present

## 2019-06-28 DIAGNOSIS — M25552 Pain in left hip: Secondary | ICD-10-CM | POA: Diagnosis not present

## 2019-06-28 DIAGNOSIS — Z8249 Family history of ischemic heart disease and other diseases of the circulatory system: Secondary | ICD-10-CM

## 2019-06-28 DIAGNOSIS — R0789 Other chest pain: Secondary | ICD-10-CM | POA: Diagnosis not present

## 2019-06-28 DIAGNOSIS — E875 Hyperkalemia: Secondary | ICD-10-CM | POA: Diagnosis not present

## 2019-06-28 DIAGNOSIS — I080 Rheumatic disorders of both mitral and aortic valves: Secondary | ICD-10-CM | POA: Diagnosis present

## 2019-06-28 DIAGNOSIS — D509 Iron deficiency anemia, unspecified: Secondary | ICD-10-CM | POA: Diagnosis not present

## 2019-06-28 DIAGNOSIS — E119 Type 2 diabetes mellitus without complications: Secondary | ICD-10-CM

## 2019-06-28 DIAGNOSIS — E785 Hyperlipidemia, unspecified: Secondary | ICD-10-CM | POA: Diagnosis not present

## 2019-06-28 DIAGNOSIS — K219 Gastro-esophageal reflux disease without esophagitis: Secondary | ICD-10-CM | POA: Diagnosis present

## 2019-06-28 DIAGNOSIS — Z8262 Family history of osteoporosis: Secondary | ICD-10-CM

## 2019-06-28 DIAGNOSIS — I252 Old myocardial infarction: Secondary | ICD-10-CM | POA: Diagnosis not present

## 2019-06-28 DIAGNOSIS — R4781 Slurred speech: Secondary | ICD-10-CM | POA: Diagnosis not present

## 2019-06-28 DIAGNOSIS — I2510 Atherosclerotic heart disease of native coronary artery without angina pectoris: Secondary | ICD-10-CM | POA: Diagnosis not present

## 2019-06-28 DIAGNOSIS — Z7902 Long term (current) use of antithrombotics/antiplatelets: Secondary | ICD-10-CM

## 2019-06-28 DIAGNOSIS — I10 Essential (primary) hypertension: Secondary | ICD-10-CM | POA: Diagnosis not present

## 2019-06-28 DIAGNOSIS — N183 Chronic kidney disease, stage 3 (moderate): Secondary | ICD-10-CM | POA: Diagnosis not present

## 2019-06-28 DIAGNOSIS — R296 Repeated falls: Secondary | ICD-10-CM | POA: Diagnosis present

## 2019-06-28 DIAGNOSIS — R404 Transient alteration of awareness: Secondary | ICD-10-CM | POA: Diagnosis not present

## 2019-06-28 DIAGNOSIS — F329 Major depressive disorder, single episode, unspecified: Secondary | ICD-10-CM | POA: Diagnosis present

## 2019-06-28 DIAGNOSIS — D649 Anemia, unspecified: Secondary | ICD-10-CM | POA: Diagnosis present

## 2019-06-28 DIAGNOSIS — E1122 Type 2 diabetes mellitus with diabetic chronic kidney disease: Secondary | ICD-10-CM | POA: Diagnosis present

## 2019-06-28 DIAGNOSIS — R0600 Dyspnea, unspecified: Secondary | ICD-10-CM

## 2019-06-28 DIAGNOSIS — Z96651 Presence of right artificial knee joint: Secondary | ICD-10-CM | POA: Diagnosis not present

## 2019-06-28 DIAGNOSIS — R001 Bradycardia, unspecified: Secondary | ICD-10-CM | POA: Diagnosis not present

## 2019-06-28 DIAGNOSIS — S299XXA Unspecified injury of thorax, initial encounter: Secondary | ICD-10-CM | POA: Diagnosis not present

## 2019-06-28 DIAGNOSIS — Z955 Presence of coronary angioplasty implant and graft: Secondary | ICD-10-CM | POA: Diagnosis not present

## 2019-06-28 DIAGNOSIS — Z833 Family history of diabetes mellitus: Secondary | ICD-10-CM

## 2019-06-28 DIAGNOSIS — F0150 Vascular dementia without behavioral disturbance: Secondary | ICD-10-CM | POA: Diagnosis present

## 2019-06-28 DIAGNOSIS — Z20828 Contact with and (suspected) exposure to other viral communicable diseases: Secondary | ICD-10-CM | POA: Diagnosis present

## 2019-06-28 DIAGNOSIS — I350 Nonrheumatic aortic (valve) stenosis: Secondary | ICD-10-CM | POA: Diagnosis not present

## 2019-06-28 DIAGNOSIS — I248 Other forms of acute ischemic heart disease: Secondary | ICD-10-CM | POA: Diagnosis not present

## 2019-06-28 DIAGNOSIS — S79911A Unspecified injury of right hip, initial encounter: Secondary | ICD-10-CM | POA: Diagnosis not present

## 2019-06-28 DIAGNOSIS — S79912A Unspecified injury of left hip, initial encounter: Secondary | ICD-10-CM | POA: Diagnosis not present

## 2019-06-28 DIAGNOSIS — J45909 Unspecified asthma, uncomplicated: Secondary | ICD-10-CM | POA: Diagnosis present

## 2019-06-28 DIAGNOSIS — I249 Acute ischemic heart disease, unspecified: Secondary | ICD-10-CM | POA: Diagnosis not present

## 2019-06-28 DIAGNOSIS — R0781 Pleurodynia: Secondary | ICD-10-CM | POA: Diagnosis not present

## 2019-06-28 DIAGNOSIS — Z951 Presence of aortocoronary bypass graft: Secondary | ICD-10-CM | POA: Diagnosis not present

## 2019-06-28 DIAGNOSIS — I6529 Occlusion and stenosis of unspecified carotid artery: Secondary | ICD-10-CM | POA: Diagnosis present

## 2019-06-28 LAB — TSH: TSH: 2.102 u[IU]/mL (ref 0.350–4.500)

## 2019-06-28 LAB — COMPREHENSIVE METABOLIC PANEL
ALT: 11 U/L (ref 0–44)
AST: 15 U/L (ref 15–41)
Albumin: 3 g/dL — ABNORMAL LOW (ref 3.5–5.0)
Alkaline Phosphatase: 54 U/L (ref 38–126)
Anion gap: 10 (ref 5–15)
BUN: 54 mg/dL — ABNORMAL HIGH (ref 8–23)
CO2: 19 mmol/L — ABNORMAL LOW (ref 22–32)
Calcium: 8.9 mg/dL (ref 8.9–10.3)
Chloride: 107 mmol/L (ref 98–111)
Creatinine, Ser: 1.98 mg/dL — ABNORMAL HIGH (ref 0.44–1.00)
GFR calc Af Amer: 26 mL/min — ABNORMAL LOW (ref >60)
GFR calc non Af Amer: 22 mL/min — ABNORMAL LOW (ref >60)
Glucose, Bld: 245 mg/dL — ABNORMAL HIGH (ref 70–99)
Potassium: 5.3 mmol/L — ABNORMAL HIGH (ref 3.5–5.1)
Sodium: 136 mmol/L (ref 135–145)
Total Bilirubin: 0.6 mg/dL (ref 0.3–1.2)
Total Protein: 6.4 g/dL — ABNORMAL LOW (ref 6.5–8.1)

## 2019-06-28 LAB — TROPONIN I (HIGH SENSITIVITY)
Troponin I (High Sensitivity): 736 ng/L (ref <18)
Troponin I (High Sensitivity): 657 ng/L (ref <18)

## 2019-06-28 LAB — CBC
HCT: 28.6 % — ABNORMAL LOW (ref 36.0–46.0)
Hemoglobin: 9.3 g/dL — ABNORMAL LOW (ref 12.0–15.0)
MCH: 29.7 pg (ref 26.0–34.0)
MCHC: 32.5 g/dL (ref 30.0–36.0)
MCV: 91.4 fL (ref 80.0–100.0)
Platelets: 218 10*3/uL (ref 150–400)
RBC: 3.13 MIL/uL — ABNORMAL LOW (ref 3.87–5.11)
RDW: 13.2 % (ref 11.5–15.5)
WBC: 7.9 10*3/uL (ref 4.0–10.5)
nRBC: 0 % (ref 0.0–0.2)

## 2019-06-28 LAB — BRAIN NATRIURETIC PEPTIDE: B Natriuretic Peptide: 487 pg/mL — ABNORMAL HIGH (ref 0.0–100.0)

## 2019-06-28 LAB — MAGNESIUM: Magnesium: 1.8 mg/dL (ref 1.7–2.4)

## 2019-06-28 LAB — PROTIME-INR
INR: 1.1 (ref 0.8–1.2)
Prothrombin Time: 13.6 seconds (ref 11.4–15.2)

## 2019-06-28 LAB — CBG MONITORING, ED: Glucose-Capillary: 254 mg/dL — ABNORMAL HIGH (ref 70–99)

## 2019-06-28 LAB — APTT: aPTT: 30 seconds (ref 24–36)

## 2019-06-28 LAB — SARS CORONAVIRUS 2 BY RT PCR (HOSPITAL ORDER, PERFORMED IN ~~LOC~~ HOSPITAL LAB): SARS Coronavirus 2: NEGATIVE

## 2019-06-28 MED ORDER — PANTOPRAZOLE SODIUM 40 MG PO TBEC
40.0000 mg | DELAYED_RELEASE_TABLET | Freq: Every day | ORAL | Status: DC
  Administered 2019-06-29 – 2019-07-02 (×4): 40 mg via ORAL
  Filled 2019-06-28 (×4): qty 1

## 2019-06-28 MED ORDER — CLOPIDOGREL BISULFATE 75 MG PO TABS: 75.0000 mg | ORAL_TABLET | Freq: Every day | ORAL | Status: DC

## 2019-06-28 MED ORDER — HEPARIN (PORCINE) 25000 UT/250ML-% IV SOLN
1050.0000 [IU]/h | INTRAVENOUS | Status: DC
  Administered 2019-06-28: 800 [IU]/h via INTRAVENOUS
  Filled 2019-06-28: qty 250

## 2019-06-28 MED ORDER — INSULIN ASPART 100 UNIT/ML ~~LOC~~ SOLN
0.0000 [IU] | Freq: Three times a day (TID) | SUBCUTANEOUS | Status: DC
  Administered 2019-06-29: 1 [IU] via SUBCUTANEOUS
  Administered 2019-06-29: 2 [IU] via SUBCUTANEOUS
  Administered 2019-06-30: 3 [IU] via SUBCUTANEOUS
  Administered 2019-06-30: 7 [IU] via SUBCUTANEOUS
  Administered 2019-06-30: 3 [IU] via SUBCUTANEOUS
  Administered 2019-07-01: 1 [IU] via SUBCUTANEOUS
  Administered 2019-07-01: 3 [IU] via SUBCUTANEOUS
  Administered 2019-07-01: 2 [IU] via SUBCUTANEOUS
  Administered 2019-07-02: 3 [IU] via SUBCUTANEOUS

## 2019-06-28 MED ORDER — SODIUM CHLORIDE 0.9 % IV SOLN: INTRAVENOUS | Status: DC

## 2019-06-28 MED ORDER — POLYETHYLENE GLYCOL 3350 17 G PO PACK: 17.0000 g | PACK | Freq: Every day | ORAL | Status: DC | PRN

## 2019-06-28 MED ORDER — ONDANSETRON HCL 4 MG PO TABS: 4.0000 mg | ORAL_TABLET | Freq: Four times a day (QID) | ORAL | Status: DC | PRN

## 2019-06-28 MED ORDER — HEPARIN BOLUS VIA INFUSION
4000.0000 [IU] | Freq: Once | INTRAVENOUS | Status: AC
  Administered 2019-06-28: 4000 [IU] via INTRAVENOUS

## 2019-06-28 MED ORDER — PAROXETINE HCL 20 MG PO TABS
20.0000 mg | ORAL_TABLET | Freq: Every morning | ORAL | Status: DC
  Administered 2019-06-29 – 2019-07-02 (×4): 20 mg via ORAL
  Filled 2019-06-28 (×4): qty 1

## 2019-06-28 MED ORDER — INSULIN DETEMIR 100 UNIT/ML ~~LOC~~ SOLN
15.0000 [IU] | Freq: Every morning | SUBCUTANEOUS | Status: DC
  Administered 2019-06-29 – 2019-06-30 (×2): 15 [IU] via SUBCUTANEOUS
  Filled 2019-06-28 (×3): qty 0.15

## 2019-06-28 MED ORDER — MOMETASONE FURO-FORMOTEROL FUM 100-5 MCG/ACT IN AERO
2.0000 | INHALATION_SPRAY | Freq: Two times a day (BID) | RESPIRATORY_TRACT | Status: DC
  Administered 2019-06-29 – 2019-07-02 (×5): 2 via RESPIRATORY_TRACT
  Filled 2019-06-28 (×2): qty 8.8

## 2019-06-28 MED ORDER — SODIUM CHLORIDE 0.9 % IV BOLUS
500.0000 mL | Freq: Once | INTRAVENOUS | Status: AC
  Administered 2019-06-28: 17:00:00 500 mL via INTRAVENOUS

## 2019-06-28 MED ORDER — NITROGLYCERIN 0.4 MG SL SUBL: 0.4000 mg | SUBLINGUAL_TABLET | SUBLINGUAL | Status: DC | PRN

## 2019-06-28 MED ORDER — ONDANSETRON HCL 4 MG/2ML IJ SOLN: 4.0000 mg | Freq: Four times a day (QID) | INTRAMUSCULAR | Status: DC | PRN

## 2019-06-28 MED ORDER — ATORVASTATIN CALCIUM 10 MG PO TABS
20.0000 mg | ORAL_TABLET | Freq: Every day | ORAL | Status: DC
  Administered 2019-06-28 – 2019-07-01 (×4): 20 mg via ORAL
  Filled 2019-06-28 (×4): qty 2

## 2019-06-28 MED ORDER — SODIUM CHLORIDE 0.9 % IV SOLN
INTRAVENOUS | Status: DC
  Administered 2019-06-28: 22:00:00 via INTRAVENOUS

## 2019-06-28 NOTE — ED Notes (Signed)
Date and time results received: 06/28/19 1730 (use smartphrase ".now" to insert current time)  Test: trop Critical Value: 736  Name of Provider Notified: dr long  Orders Received? Or Actions Taken?: see chart

## 2019-06-28 NOTE — Progress Notes (Addendum)
[facility] admits, Cardiology and Internal Med paged for patients arrival to 3east. Pt is stable. HR in the 30s. Skin assessment complete. Pt in no acute distress. Will continue to monitor pt.

## 2019-06-28 NOTE — Progress Notes (Signed)
MD put in orders for pt to be transferred to progressive unit. In orders MD states pt goes to 2West progressive floor. No beds available on 2West so pt was placed on 4East

## 2019-06-28 NOTE — Progress Notes (Addendum)
ANTICOAGULATION CONSULT NOTE - Initial Consult  Pharmacy Consult for heparin Indication: chest pain/ACS/NSTEMI  Allergies  Allergen Reactions  . Advil [Ibuprofen] Swelling  . Heparin Other (See Comments)    Confusion, hallucinations.   . Propoxyphene N-Acetaminophen Other (See Comments)    Numbness all over. "floating" sensation.    Patient Measurements: Height: 5\' 6"  (167.6 cm) Weight: 148 lb (67.1 kg) IBW/kg (Calculated) : 59.3 Heparin Dosing Weight: 67 kg  Vital Signs: Temp: 98.6 F (37 C) (07/22 1628) BP: 115/43 (07/22 1630) Pulse Rate: 39 (07/22 1630)  Labs: Recent Labs    06/28/19 1634 06/28/19 1637  HGB  --  9.3*  HCT  --  28.6*  PLT  --  218  CREATININE  --  1.98*  TROPONINIHS 736*  --     Estimated Creatinine Clearance: 19.4 mL/min (A) (by C-G formula based on SCr of 1.98 mg/dL (H)).   Medical History: Past Medical History:  Diagnosis Date  . Anemia   . Anxiety   . Aortic stenosis   . Arthritis    back, knees, and hips  . Asthma    with allergies  . Balance problems   . CAD (coronary artery disease)   . Carotid stenosis   . CHF (congestive heart failure) ([place])    EF preserved Echo 2012  . CKD (chronic kidney disease)   . 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 Dementia ([place])    "a little"  . Depression   . Diabetes mellitus    type 2  . GERD (gastroesophageal reflux disease)   . Hypertension   . Iron deficiency anemia 05/27/2011  . Myocardial infarction ([place])   . Renal insufficiency   . Scoliosis   . Vertigo    "when sugar gets low"  . Vision loss    left eye-"half blind"    Medications:  (Not in a hospital admission)   Assessment: Pharmacy consulted to dose heparin in patient with ACS/NSTEMI. Patient not on anticoagulation prior to admission.  Patient has listed allergy to heparin of "confusion" but benefits outweigh risk.  Goal of Therapy:   Heparin level 0.3-0.7 units/ml Monitor platelets by anticoagulation protocol: Yes   Plan:  Give 4000 units bolus x 1 Start heparin infusion at 800 units/hr Check anti-Xa level in 8 hours and daily while on heparin Continue to monitor H&H and platelets  Ramond Craver 06/28/2019,6:07 PM

## 2019-06-28 NOTE — ED Provider Notes (Addendum)
[facility] EMERGENCY DEPARTMENT Provider Note   CSN: [id number] Arrival date & time: 06/28/19  1616     History   Chief Complaint Chief Complaint  Patient presents with  . Fatigue    HPI  History provided by Finis Bud, Ms. Starrett's oldest daughter and caregiver due to patient's cognitive impairment.  AEVAH STANSBERY is a 85 y.o. female with a PMH of type 2 diabetes on insulin therapy, aortic stenosis, HFpEF, HTN who presents with fatigue and generalized weakness starting yesterday, July 21.  According to her daughter, Georgette Helmer, the patient was mumbling yesterday and acting as if she was hypoglycemic, but she seemed to improve after eating dinner.  Then around midday today, she seemed weak and tired and could not climb the stairs.  She has not been outside in the heat for any considerable length of time today.  The patient's daughter says that she helps take care of her by cooking and taking care of bills, but Ms. Olberding does all of her ADLs independently.  Ms. Klinkner daughter says that her primary doctor says that she does not have dementia, but another doctor has diagnosed her with this.  She says that her mother will sometimes forget to take her medication, but it is organized in daily pill boxes, and she does not think she has taken extra medication.   Ms. Gruenberg daughter also reports that on Friday, she fell on her porch and landed on her right side and has had a sore hip since then.  She is worried that she has fractured that hip.   Past Medical History:  Diagnosis Date  . Anemia   . Anxiety   . Aortic stenosis   . Arthritis    back, knees, and hips  . Asthma    with allergies  . Balance problems   . CAD (coronary artery disease)   . Carotid stenosis   . CHF (congestive heart failure) ([place])    EF preserved Echo 2012  . CKD (chronic kidney disease)   . 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 Dementia ([place])    "a little"  . Depression   . Diabetes mellitus    type 2  . GERD (gastroesophageal reflux disease)   . Hypertension   . Iron deficiency anemia 05/27/2011  . Myocardial infarction ([place])   . Renal insufficiency   . Scoliosis   . Vertigo    "when sugar gets low"  . Vision loss    left eye-"half blind"    Patient Active Problem List   Diagnosis Date Noted  . Apnea 02/15/2019  . Snoring 02/15/2019  . Polyp of colon   . Accelerated hypertension 01/20/2019  . Rectal bleeding 11/08/2018  . Rectal pain 11/08/2018  . Abnormal CT scan, colon 11/08/2018  . (HFpEF) heart failure with preserved ejection fraction ([place]) 10/09/2018  . Acute on chronic diastolic CHF (congestive heart failure) ([place]) 10/09/2018  . Aortic stenosis, moderate 10/09/2018  . Mitral valve stenosis 10/09/2018  . Acute exacerbation of CHF (congestive heart failure) ([place]) 10/07/2018  . Acute hypoxemic respiratory failure ([place]) 10/07/2018  . Chronic anemia 10/07/2018  . Asthma 10/07/2018  . CVA (cerebral vascular accident) ([place]) 10/07/2018  . CAD (coronary artery disease) 10/07/2018  . Constipation 10/07/2018  . Physical deconditioning 10/07/2018  . History of recurrent UTIs 10/07/2018  . CKD (chronic kidney disease) stage 3, GFR 30-59 ml/min (HCC) 12/22/2017  .  Dyslipidemia 12/22/2017  . Weakness 11/23/2017  . Overweight (BMI 25.0-29.9) 05/08/2016  . UTI (lower urinary tract infection) 11/12/2015  . DM hyperosmolarity type II, uncontrolled ([place]) 11/12/2015  . Dementia ([place])   . Encephalopathy, metabolic 77/93/9030  . S/P right TKA 08/13/2015  . S/P knee replacement 08/13/2015  . Elevated troponin 03/06/2015  . Fever   . History of diabetes mellitus   . Left buttock pain   . Mixed hyperlipidemia 01/11/2015  . GAD (generalized anxiety disorder) 01/11/2015  . Depression 01/11/2015  . GERD (gastroesophageal reflux disease) 01/11/2015  . Osteopenia 07/13/2014  . Obesity  (BMI 30-39.9) 06/19/2014  . Carotid stenosis   . Anemia of chronic disease 12/25/2011  . Iron deficiency anemia 05/27/2011  . Diastolic CHF, chronic ([place]) 04/02/2010  . Type 2 diabetes mellitus with insulin therapy ([place]) 10/08/2009  . HLD (hyperlipidemia) 10/08/2009  . HTN (hypertension) 10/08/2009  . Coronary atherosclerosis 10/08/2009  . CAROTID STENOSIS 10/08/2009    Past Surgical History:  Procedure Laterality Date  . APPENDECTOMY     with hysterectomy  . CATARACT EXTRACTION Bilateral 5 years ago  . CHOLECYSTECTOMY    . COLONOSCOPY  2005   Dr. Amedeo Plenty: Diverticulosis  . COLONOSCOPY WITH PROPOFOL N/A 02/14/2019   Procedure: COLONOSCOPY WITH PROPOFOL;  Surgeon: Danie Binder, MD;  Location: [facility] ENDO SUITE;  Service: Endoscopy;  Laterality: N/A;  . CORONARY ANGIOPLASTY     prior to 2006 5 stents  . CORONARY ARTERY BYPASS GRAFT  2006  . [facility] & [facility] of [place]  April 2013  . KNEE SURGERY Right   . POLYPECTOMY  02/14/2019   Procedure: POLYPECTOMY;  Surgeon: Danie Binder, MD;  Location: [facility] ENDO SUITE;  Service: Endoscopy;;  cecal polyp  . TOTAL ABDOMINAL HYSTERECTOMY  ~85 years old   complete, with tumor removal  . TOTAL KNEE ARTHROPLASTY Right 08/13/2015   Procedure: RIGHT  TOTAL KNEE ARTHROPLASTY;  Surgeon: Paralee Cancel, MD;  Location: [facility] ORS;  Service: Orthopedics;  Laterality: Right;     OB History    Gravida  4   Para  4   Term  4   Preterm      AB      Living  3     SAB      TAB      Ectopic      Multiple      Live Births               Home Medications    Prior to Admission medications   Medication Sig Start Date End Date Taking? Authorizing Provider  albuterol (PROVENTIL HFA;VENTOLIN HFA) 108 (90 Base) MCG/ACT inhaler Inhale 2 puffs into the lungs every 6 (six) hours as needed for wheezing or shortness of breath. 01/20/19   [name], MD  amLODipine (NORVASC) 10 MG tablet TAKE 1 TABLET DAILY. 02/03/19   [name], MD  Artificial Tear  Ointment (DRY EYES OP) Apply 1 drop to eye daily as needed (dry/red eyes).     [provider]  aspirin 81 MG chewable tablet Chew 81 mg by mouth at bedtime. 08/04/10   [provider]  atorvastatin (LIPITOR) 20 MG tablet Take 1 tablet (20 mg total) by mouth at bedtime. 10/18/18   [name], MD  blood glucose meter kit and supplies KIT Dispense based on insurance coverage. Use 4 times a day as directed Dx: E11.9, Z79.4 06/13/18   Timmothy Euler, MD  budesonide-formoterol (SYMBICORT) 80-4.5 MCG/ACT inhaler Inhale 2  puffs into the lungs 2 (two) times daily. Patient taking differently: Inhale 2 puffs into the lungs 2 (two) times daily as needed (shortness of breath or wheezing).  01/20/19   [name], MD  clopidogrel (PLAVIX) 75 MG tablet TAKE 1 TABLET DAILY. 02/03/19   [name], MD  esomeprazole (NEXIUM) 40 MG capsule TAKE 1 CAPSULE BY MOUTH ONCE DAILY. 05/26/19   [name], MD  ferrous sulfate 324 (65 Fe) MG TBEC Take 324 mg by mouth daily.     [provider]  FORA LANCETS MISC Test blood sugars twice daily 01/20/19   [name], MD  furosemide (LASIX) 20 MG tablet 20 mg. Take 2 tablets by mouth in the morning    [provider]  glucose blood (ACCU-CHEK AVIVA) test strip Use to check blood glucose twice a day 01/20/19   [name], MD  hydrALAZINE (APRESOLINE) 50 MG tablet TAKE 1 TABLET BY MOUTH 3 TIMES DAILY. 05/26/19   [name], MD  hydrocortisone (ANUSOL-HC) 25 MG suppository Place 1 suppository (25 mg total) rectally 2 (two) times daily. 01/20/19   [name], MD  insulin lispro (HUMALOG KWIKPEN) 100 UNIT/ML KiwkPen Inject 0.04-0.1 mLs (4-10 Units total) into the skin daily as needed (high blood sugar). Patient taking differently: Inject 5-15 Units into the skin 2 (two) times daily.  07/12/18   [name], MD  Insulin Pen Needle (EASY TOUCH PEN NEEDLES) 31G X 8 MM MISC Use daily with insulin Dx E11.9 04/03/19   [name],  MD  Insulin Syringe-Needle U-100 (EASY TOUCH INSULIN SYRINGE) 31G X 5/16" 1 ML MISC USE AS DIRECTED. 01/20/19   [name], MD  isosorbide mononitrate (IMDUR) 30 MG 24 hr tablet Take 1 tablet (30 mg total) by mouth daily. 10/18/18   [name], MD  LEVEMIR FLEXTOUCH 100 UNIT/ML Pen INJECT [address] UNITS EACH MORNING 06/19/19   [name], MD  lisinopril (PRINIVIL,ZESTRIL) 20 MG tablet Take 1 tablet (20 mg total) by mouth every morning. 10/18/18   [name], MD  meclizine (ANTIVERT) 25 MG tablet TAKE 1 TABLET TWICE DAILY AS NEEDED FOR DIZZINESS. Patient taking differently: Take 25 mg by mouth 2 (two) times daily.  01/02/19   [name], MD  metFORMIN (GLUCOPHAGE-XR) 500 MG 24 hr tablet TAKE 1 TABLET 2 TIMES A DAY WITH BREAKFAST & DINNER. 05/08/19   [name], MD  methenamine (HIPREX) 1 g tablet Take 1 tablet (1 g total) by mouth 2 (two) times daily. 12/14/18   [name], MD  metoprolol tartrate (LOPRESSOR) 50 MG tablet Take 1 tablet (50 mg total) by mouth 2 (two) times daily. 01/03/19   [name], MD  Multiple Vitamins-Iron (DAILY VITAMINS/IRON/BETA CAROT PO) Take 1 tablet by mouth at bedtime.    [provider]  nabumetone (RELAFEN) 500 MG tablet Take 1 tablet (500 mg total) by mouth 2 (two) times daily. For muscle and joint pain 04/20/19   [name], MD  nitroGLYCERIN (NITROSTAT) 0.4 MG SL tablet PLACE (1) TABLET UNDER TONGUE EVERY 5 MINUTES UP TO (3) DOSES. IF NO RELIEF CALL 911. 05/26/19   [name], MD  omeprazole (PRILOSEC) 20 MG capsule 1 PO 30 MINS PRIOR TO BREAKFAST. 02/14/19   Fields, Marga Melnick, MD  PARoxetine (PAXIL) 20 MG tablet TAKE 1 TABLET BY MOUTH EVERY MORNING. 04/03/19   [name], MD  polyethylene glycol [facility]) packet Take 17 g by mouth daily. Patient taking differently: Take 17 g by mouth daily as needed for mild constipation or moderate  constipation.  07/25/18   Maudie Flakes, MD  spironolactone (ALDACTONE) 25 MG tablet TAKE (2) TABLETS  DAILY IN THE MORNING. 05/26/19   [name], MD    Family History Family History  Problem Relation Age of Onset  . Cancer Brother        porstate  . Early death Sister   . Heart disease Brother   . Heart disease Brother   . Heart attack Brother   . Heart disease Brother   . Heart attack Brother   . Diabetes Sister   . Heart attack Sister   . Diabetes Sister   . Osteoporosis Sister   . Hypertension Sister   . Heart attack Son   . Early death Son   . Pancreatitis Son   . Heart attack Daughter 46    Social History Social History   Tobacco Use  . Smoking status: Never Smoker  . Smokeless tobacco: Never Used  Substance Use Topics  . Alcohol use: No  . Drug use: No     Allergies   Advil [ibuprofen], Heparin, and Propoxyphene n-acetaminophen   Review of Systems Review of Systems  Constitutional: Positive for activity change and fatigue. Negative for appetite change and fever.  HENT: Negative for congestion and rhinorrhea.   Respiratory: Negative for cough and shortness of breath.   Cardiovascular: Negative for chest pain.  Gastrointestinal: Positive for constipation.  Genitourinary: Negative for difficulty urinating.  Musculoskeletal: Negative for myalgias.   - Full ROS difficult to obtain due to impaired cognition   Physical Exam Updated Vital Signs There were no vitals taken for this visit.  Physical Exam Constitutional:      General: She is not in acute distress.    Appearance: Normal appearance. She is not ill-appearing.  HENT:     Head: Normocephalic and atraumatic.     Right Ear: External ear normal.     Left Ear: External ear normal.     Nose: Nose normal. No congestion or rhinorrhea.     Mouth/Throat:     Mouth: Mucous membranes are dry.     Pharynx: No oropharyngeal exudate or posterior oropharyngeal erythema.  Eyes:     Extraocular Movements: Extraocular movements intact.     Conjunctiva/sclera: Conjunctivae normal.     Pupils: Pupils are  equal, round, and reactive to light.  Neck:     Musculoskeletal: Normal range of motion.  Cardiovascular:     Rate and Rhythm: Regular rhythm. Bradycardia present.     Heart sounds: Murmur (5/6 systolic) present.     Comments: No JVD Pulmonary:     Effort: Pulmonary effort is normal.     Breath sounds: Normal breath sounds.  Abdominal:     General: Abdomen is flat.     Palpations: Abdomen is soft.     Tenderness: There is abdominal tenderness (in LLQ).  Musculoskeletal:        General: Tenderness (on external left hip rotation) present. No deformity.     Right lower leg: No edema.     Left lower leg: No edema.  Skin:    General: Skin is warm and dry.  Neurological:     General: No focal deficit present.     Mental Status: She is alert and oriented to person, place, and time.  Psychiatric:        Mood and Affect: Mood normal.        Behavior: Behavior normal.      ED Treatments / Results  Labs (  all labs ordered are listed, but only abnormal results are displayed) Labs Reviewed  COMPREHENSIVE METABOLIC PANEL  CBC  URINALYSIS, ROUTINE W REFLEX MICROSCOPIC    EKG None  Radiology No results found.  Procedures Procedures (including critical care time)  Medications Ordered in ED Medications  sodium chloride 0.9 % bolus 500 mL (has no administration in time range)     Initial Impression / Assessment and Plan / ED Course  I have reviewed the triage vital signs and the nursing notes.  Pertinent labs & imaging results that were available during my care of the patient were reviewed by me and considered in my medical decision making (see chart for details).        EKG is significant for bradycardia to 40 bpm, although morphology appears at her baseline.  It is possible that this is from accidental overdose of metoprolol, but patient's daughter denies this.  Troponin is highly elevated at 736.  This could partly be due to her reduced kidney function with her creatinine  of 1.98, but it is possible that patient has had an NSTEMI over the past 24 hours, although she denies chest pain.  This could have caused a conduction abnormality leading to her bradycardia.  Hemoglobin is slightly lower than her baseline at 9.3, but this is likely not caused her acute weakness and fatigue.  Given her AKI, will admit to medicine and consult cardiology for management of her bradycardia and elevated troponin.   Will start a heparin gtt for possible NSTEMI.  Given patient's recent fall and L sided abdominal pain radiating to the groin with pain on external rotation of the thigh, obtained x-rays of bilateral hips which were negative.  Final Clinical Impressions(s) / ED Diagnoses   Final diagnoses:  None    ED Discharge Orders    None       Kathrene Alu, MD 06/28/19 Einar Crow    Kathrene Alu, MD 06/28/19 1911    Margette Fast, MD 06/29/19 [phone]

## 2019-06-28 NOTE — H&P (Signed)
History and Physical    [name] VEH:[id number] DOB: 1931-07-31 DOA: 06/28/2019  PCP: [name], MD   Patient coming from: Home  I have personally briefly reviewed patient's old medical records in [place]  Chief Complaint: Generalized weakness  HPI: [name] is a 85 y.o. female with medical history significant for coronary artery disease, CVA, diastolic CHF, aortic stenosis, CKD 3, asthma, hypertension and diabetes, presented to the ED with complaints of generalized weakness.  Patient is awake and alert, answering questions, but due to dementia and her speech-which is slurred but her baseline, its hard to get detailed history from her. She denies difficulty breathing or chest pain. Patient speech appears to be slightly slurred, she reports this is normal for her from prior strokes.  Per notes, since daughter reports patient's was weak and tired today, and could not climb the steps.  Patient is mostly independent of her ADLs.  Patient fell on Friday-5 days ago,, and landed on her right side and reports sore hip since then. I talked to patient's daughter, fall was witnessed, patient fell while she was trying to get hold onto the porch with her walker.  Patient's daughter reports that patient hit her head.  Patient has fallen at least twice in the past 1 to 2 weeks.  She reports some pain with urination and with bowel movements.  She denies vomiting or loose stools, patient has maintained good p.o. intake.  She takes 40 mg of Lasix every day.  ED Course: Heart rate 38-42.  Blood pressure systolic 1 1 4-1 36, potassium 5.3.  Magnesium 1.8.  Creatinine elevated 1.9, baseline 1.1-1.3.  Hemoglobin 9.3, last checked 5 months ago 9.9.  Pelvic x-ray negative for acute abnormality.  Two-view chest x-ray-negative for acute abnormality.  EKG shows junctional rhythm rate 40 with right bundle branch block that appears new.  QTc 446.  Prolonged QRS 161.  Elevated high-sensitivity troponin  736 >> 657. EDP talked to cardiologist on-call Dr. Ahmed Prima recommended transfer to [facility], heparin GTT. hospitalist to admit for symptomatic bradycardia, and acute kidney injury.  Review of Systems: As per HPI all other systems reviewed and negative.  Past Medical History:  Diagnosis Date  . Anemia   . Anxiety   . Aortic stenosis   . Arthritis    back, knees, and hips  . Asthma    with allergies  . Balance problems   . CAD (coronary artery disease)   . Carotid stenosis   . CHF (congestive heart failure) ([place])    EF preserved Echo 2012  . CKD (chronic kidney disease)   . 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 Dementia ([place])    "a little"  . Depression   . Diabetes mellitus    type 2  . GERD (gastroesophageal reflux disease)   . Hypertension   . Iron deficiency anemia 05/27/2011  . Myocardial infarction ([place])   . Renal insufficiency   . Scoliosis   . Vertigo    "when sugar gets low"  . Vision loss    left eye-"half blind"    Past Surgical History:  Procedure Laterality Date  . APPENDECTOMY     with hysterectomy  . CATARACT EXTRACTION Bilateral 5 years ago  . CHOLECYSTECTOMY    . COLONOSCOPY  2005   Dr. Amedeo Plenty: Diverticulosis  . COLONOSCOPY WITH PROPOFOL N/A 02/14/2019   Procedure: COLONOSCOPY WITH PROPOFOL;  Surgeon: Danie Binder, MD;  Location: [facility];  Service: Endoscopy;  Laterality: N/A;  . CORONARY ANGIOPLASTY     prior to 2006 5 stents  . CORONARY ARTERY BYPASS GRAFT  2006  . [facility] [place]  April 2013  . KNEE SURGERY Right   . POLYPECTOMY  02/14/2019   Procedure: POLYPECTOMY;  Surgeon: Danie Binder, MD;  Location: [facility];  Service: Endoscopy;;  cecal polyp  . TOTAL ABDOMINAL HYSTERECTOMY  ~85 years old   complete, with tumor removal  . TOTAL KNEE ARTHROPLASTY Right 08/13/2015   Procedure: RIGHT  TOTAL KNEE ARTHROPLASTY;  Surgeon: Paralee Cancel, MD;   Location: [facility];  Service: Orthopedics;  Laterality: Right;     reports that she has never smoked. She has never used smokeless tobacco. She reports that she does not drink alcohol or use drugs.  Allergies  Allergen Reactions  . Advil [Ibuprofen] Swelling  . Heparin Other (See Comments)    Confusion, hallucinations.   . Propoxyphene N-Acetaminophen Other (See Comments)    Numbness all over. "floating" sensation.    Family History  Problem Relation Age of Onset  . Cancer Brother        porstate  . Early death Sister   . Heart disease Brother   . Heart disease Brother   . Heart attack Brother   . Heart disease Brother   . Heart attack Brother   . Diabetes Sister   . Heart attack Sister   . Diabetes Sister   . Osteoporosis Sister   . Hypertension Sister   . Heart attack Son   . Early death Son   . Pancreatitis Son   . Heart attack Daughter 11    Prior to Admission medications   Medication Sig Start Date End Date Taking? Authorizing Provider  albuterol (PROVENTIL HFA;VENTOLIN HFA) 108 (90 Base) MCG/ACT inhaler Inhale 2 puffs into the lungs every 6 (six) hours as needed for wheezing or shortness of breath. 01/20/19  Yes Stacks, Cletus Gash, MD  amLODipine (NORVASC) 10 MG tablet TAKE 1 TABLET DAILY. Patient taking differently: Take 10 mg by mouth daily.  02/03/19  Yes [name], MD  aspirin 81 MG chewable tablet Chew 81 mg by mouth at bedtime. 08/04/10  Yes [provider]  atorvastatin (LIPITOR) 20 MG tablet Take 1 tablet (20 mg total) by mouth at bedtime. 10/18/18  Yes Stacks, Cletus Gash, MD  clopidogrel (PLAVIX) 75 MG tablet TAKE 1 TABLET DAILY. Patient taking differently: Take 75 mg by mouth daily.  02/03/19  Yes Stacks, Cletus Gash, MD  esomeprazole (NEXIUM) 40 MG capsule TAKE 1 CAPSULE BY MOUTH ONCE DAILY. Patient taking differently: Take 40 mg by mouth daily at 12 noon.  05/26/19  Yes [name], MD  ferrous sulfate 324 (65 Fe) MG TBEC Take 324 mg by mouth daily.    Yes  [provider]  furosemide (LASIX) 20 MG tablet Take 60 mg by mouth every morning.    Yes [provider]  hydrALAZINE (APRESOLINE) 50 MG tablet TAKE 1 TABLET BY MOUTH 3 TIMES DAILY. Patient taking differently: Take 50 mg by mouth 3 (three) times daily.  05/26/19  Yes Stacks, Cletus Gash, MD  insulin lispro (HUMALOG KWIKPEN) 100 UNIT/ML KiwkPen Inject 0.04-0.1 mLs (4-10 Units total) into the skin daily as needed (high blood sugar). Patient taking differently: Inject 5-15 Units into the skin daily as needed (for high blood sugar levels over 250).  07/12/18  Yes [name], MD  isosorbide  mononitrate (IMDUR) 30 MG 24 hr tablet Take 1 tablet (30 mg total) by mouth daily. 10/18/18  Yes Stacks, Cletus Gash, MD  LEVEMIR FLEXTOUCH 100 UNIT/ML Pen INJECT [place] MORNING Patient taking differently: Inject 24 Units into the skin every morning.  06/19/19  Yes Stacks, Cletus Gash, MD  metFORMIN (GLUCOPHAGE-XR) 500 MG 24 hr tablet TAKE 1 TABLET 2 TIMES A DAY WITH BREAKFAST & DINNER. Patient taking differently: Take 500 mg by mouth 2 (two) times a day. With breakfast and dinner 05/08/19  Yes Stacks, Cletus Gash, MD  methenamine (HIPREX) 1 g tablet Take 1 tablet (1 g total) by mouth 2 (two) times daily. 12/14/18  Yes [name], MD  metoprolol tartrate (LOPRESSOR) 50 MG tablet Take 1 tablet (50 mg total) by mouth 2 (two) times daily. 01/03/19  Yes Stacks, Cletus Gash, MD  Multiple Vitamins-Iron (DAILY VITAMINS/IRON/BETA CAROT PO) Take 1 tablet by mouth at bedtime.   Yes [provider]  nabumetone (RELAFEN) 500 MG tablet Take 1 tablet (500 mg total) by mouth 2 (two) times daily. For muscle and joint pain Patient taking differently: Take 500 mg by mouth 2 (two) times daily as needed for mild pain or moderate pain. For muscle and joint pain 04/20/19  Yes Stacks, Cletus Gash, MD  nitroGLYCERIN (NITROSTAT) 0.4 MG SL tablet PLACE (1) TABLET UNDER TONGUE EVERY 5 MINUTES UP TO (3) DOSES. IF NO RELIEF CALL 911. Patient  taking differently: Place 0.4 mg under the tongue every 5 (five) minutes as needed for chest pain.  05/26/19  Yes Stacks, Cletus Gash, MD  omeprazole (PRILOSEC) 20 MG capsule 1 PO 30 MINS PRIOR TO BREAKFAST. Patient taking differently: Take 20 mg by mouth daily before breakfast.  02/14/19  Yes Fields, Sandi L, MD  PARoxetine (PAXIL) 20 MG tablet TAKE 1 TABLET BY MOUTH EVERY MORNING. Patient taking differently: Take 20 mg by mouth daily.  04/03/19  Yes Stacks, Cletus Gash, MD  polyethylene glycol [facility]) packet Take 17 g by mouth daily. Patient taking differently: Take 17 g by mouth daily as needed for mild constipation or moderate constipation.  07/25/18  Yes Maudie Flakes, MD  spironolactone (ALDACTONE) 25 MG tablet TAKE (2) TABLETS DAILY IN THE MORNING. Patient taking differently: Take 50 mg by mouth every morning.  05/26/19  Yes [name], MD    Physical Exam: Vitals:   06/28/19 1833 06/28/19 1900 06/28/19 1930 06/28/19 2153  BP: (!) 120/45 (!) 122/55 (!) 136/45 (!) 127/51  Pulse: (!) 38 (!) 37 (!) 38 (!) 36  Resp: 17 12 (!) 21 20  Temp: 98.7 F (37.1 C)   98.1 F (36.7 C)  TempSrc: Oral   Oral  SpO2: 95% 94% 94% 98%  Weight:    70.4 kg  Height:    5\' 6"  (1.676 m)    Constitutional: NAD, calm, comfortable Vitals:   06/28/19 1833 06/28/19 1900 06/28/19 1930 06/28/19 2153  BP: (!) 120/45 (!) 122/55 (!) 136/45 (!) 127/51  Pulse: (!) 38 (!) 37 (!) 38 (!) 36  Resp: 17 12 (!) 21 20  Temp: 98.7 F (37.1 C)   98.1 F (36.7 C)  TempSrc: Oral   Oral  SpO2: 95% 94% 94% 98%  Weight:    70.4 kg  Height:    5\' 6"  (1.676 m)   Eyes: PERRL, lids and conjunctivae normal ENMT: Mucous membranes are moist. Posterior pharynx clear of any exudate or lesions. Neck: normal, supple, no masses, no thyromegaly Respiratory: clear to auscultation bilaterally, no wheezing, no crackles. Normal respiratory  effort. No accessory muscle use.  Cardiovascular: 3/6 systolic murmur, bradycardic but regular rate and  rhythm, no extremity edema. 2+ pedal pulses.  Abdomen: no tenderness, no masses palpated. No hepatosplenomegaly. Bowel sounds positive.  Musculoskeletal: no clubbing / cyanosis. No joint deformity upper and lower extremities. Good ROM, no contractures. Normal muscle tone.  Skin: no rashes, lesions, ulcers. No induration Neurologic: Slurred speech, otherwise no focal cranial nerve deficits strength 4+/5 upper extremities, 4/5 bilateral lower extremities.  Psychiatric: Normal judgment and insight. Alert and oriented x 2. Normal mood.   Labs on Admission: I have personally reviewed following labs and imaging studies  CBC: Recent Labs  Lab 06/28/19 1637  WBC 7.9  HGB 9.3*  HCT 28.6*  MCV 91.4  PLT 993   Basic Metabolic Panel: Recent Labs  Lab 06/28/19 1634 06/28/19 1637  NA  --  136  K  --  5.3*  CL  --  107  CO2  --  19*  GLUCOSE  --  245*  BUN  --  54*  CREATININE  --  1.98*  CALCIUM  --  8.9  MG 1.8  --    Liver Function Tests: Recent Labs  Lab 06/28/19 1637  AST 15  ALT 11  ALKPHOS 54  BILITOT 0.6  PROT 6.4*  ALBUMIN 3.0*   Coagulation Profile: Recent Labs  Lab 06/28/19 1637  INR 1.1   CBG: Recent Labs  Lab 06/28/19 2018  GLUCAP 254*    Radiological Exams on Admission: Dg Chest 2 View  Result Date: 06/28/2019 CLINICAL DATA:  Weakness.  Left lower rib pain.  Fall last week. EXAM: CHEST - 2 VIEW COMPARISON:  Chest x-ray dated January 20, 2019. FINDINGS: Stable cardiomediastinal silhouette status post CABG. Atherosclerotic calcification of the aortic arch. Normal pulmonary vascularity. No focal consolidation, pleural effusion, or pneumothorax. No acute osseous abnormality. Chronic severe L1 compression deformity. IMPRESSION: No active cardiopulmonary disease. Electronically Signed   By: Titus Dubin M.D.   On: 06/28/2019 17:30   Dg Hips Bilat W Or Wo Pelvis 2 Views  Result Date: 06/28/2019 CLINICAL DATA:  Fall 1 week ago with persistent left-sided hip  pain, initial encounter EXAM: DG HIP (WITH OR WITHOUT PELVIS) 4V BILAT COMPARISON:  10/26/2016 FINDINGS: The pelvic ring is intact. Diffuse vascular calcifications are seen. No acute fracture or dislocation is noted. No soft tissue abnormality is seen. IMPRESSION: No acute abnormality noted. Electronically Signed   By: Inez Catalina M.D.   On: 06/28/2019 18:03    EKG: Independently reviewed.  Junctional rhythm rate 40, QRS 161, QTc 446.  Right bundle branch block- appears new.  Assessment/Plan Active Problems:   Generalized weakness   Symptomatic bradycardia-heart rate 40s, EKG shows junctional rhythm.  High-sensitivity troponin elevated 736 >> 657.  TSH pending.  Potassium 5.3 magnesium 1.8.  Patient denies chest pain or difficulty breathing.  Home medications metoprolol tartrate 50 mg twice daily. EDP talked to cardiologist Dr. Ahmed Prima, recs for IV heparin started for possible NSTEMI, cardiology to see patient in a.m -Continue heparin GTT -Hold home metoprolol -Follow-up TSH -With significant bradycardia, junctional rhythm will transfer to stepdown -Echocardiogram  Acute kidney injury on CKD 3-creatinine 1.98, baseline 1.1-1.3.  Likely prerenal from Lasix. -Hold home Lasix -Gentle fluids n/s 100cc/hr x 15 hrs - BMP a.m  Hyperkalemia-5.3.  Normal magnesium.  Likely from spironolactone. -Hold home spironolactone  Generalized weakness, falls-at least 2 falls in the past 2 weeks.  Mechanical falls.  Ambulates with a walker.  Daughter  reports dysuria, chronic lower extremity weakness worse after knee surgery.  Daughter reports patient hit her head when she fell, on at least one occasion. - head CT, patient is on anticoagulation -PT evaluation - f/u UA  Hypertension-blood pressure systolic 624-469.  Despite bradycardia. -Hold home metoprolol, Norvasc, hydralazine, spironolactone -Hold home Lasix  Diabetes mellitus-random glucose 245 -Continue home levemir at reduced dose 15 units - SSI   Diastolic CHF-stable.  With acute kidney injury.  Last Echo 10/2018, EF 60 to 50%, systolic dysfunction. -Hold home Lasix, metoprolol.  Aortic stenosis, CAD history- 3/6 systolic murmur on exam.  History of coronary angioplasty with 5 stents prior to 2005, CABG 2006, follows with Dr. Percival Spanish.  -Hold aspirin and Plavix for now while on heparin -Hold metoprolol for bradycardia.  CVA, Dementia and depression-patient mostly independent of her ADLs.  -Continue home Lipitor, paroxetine - Hold Plavix, aspirin for now while on heparin GTT  Chronic anemia-hemoglobin 9.3, last check in February 9.9.  Otherwise baseline ~11.  On home iron supplements. -Iron studies.   DVT prophylaxis: Heparin gtt Code Status: Full- Confirmed with daughter. Family Communication: None at bedside. Spoke extensively to patient's daughter- Ailine on the phone.  All questions answered plan of care explained. Disposition Plan: Per rounding team Consults called: CArdiology Admission status: Obs, tele  Bethena Roys MD [facility]  06/28/2019, 11:23 PM

## 2019-06-28 NOTE — ED Triage Notes (Signed)
Per EMS- pt family reports generalized weakness and fatigue today after being out for a while today. Pt reports feeling hot all over and weak all over. NAD noted.

## 2019-06-28 NOTE — Plan of Care (Signed)
  Problem: Education: Goal: Knowledge of General Education information will improve Description: Including pain rating scale, medication(s)/side effects and non-pharmacologic comfort measures Outcome: Not Met (add Reason)   Problem: Health Behavior/Discharge Planning: Goal: Ability to manage health-related needs will improve Outcome: Progressing   Problem: Clinical Measurements: Goal: Ability to maintain clinical measurements within normal limits will improve Outcome: Progressing Goal: Will remain free from infection Outcome: Progressing Goal: Diagnostic test results will improve Outcome: Progressing Goal: Respiratory complications will improve Outcome: Progressing Goal: Cardiovascular complication will be avoided Outcome: Not Met (add Reason)   Problem: Activity: Goal: Risk for activity intolerance will decrease Outcome: Progressing   Problem: Nutrition: Goal: Adequate nutrition will be maintained Outcome: Progressing   Problem: Coping: Goal: Level of anxiety will decrease Outcome: Progressing   Problem: Elimination: Goal: Will not experience complications related to bowel motility Outcome: Progressing Goal: Will not experience complications related to urinary retention Outcome: Progressing   Problem: Pain Managment: Goal: General experience of comfort will improve Outcome: Progressing   Problem: Safety: Goal: Ability to remain free from injury will improve Outcome: Progressing   Problem: Skin Integrity: Goal: Risk for impaired skin integrity will decrease Outcome: Progressing   Problem: Education: Goal: Ability to demonstrate management of disease process will improve Outcome: Progressing Goal: Ability to verbalize understanding of medication therapies will improve Outcome: Progressing Goal: Individualized Educational Video(s) Outcome: Progressing   Problem: Activity: Goal: Capacity to carry out activities will improve Outcome: Progressing   Problem:  Cardiac: Goal: Ability to achieve and maintain adequate cardiopulmonary perfusion will improve Outcome: Progressing

## 2019-06-28 NOTE — ED Notes (Signed)
Date and time results received: 06/28/19 1930 (use smartphrase ".now" to insert current time)  Test: troponin Critical Value: 657  Name of Provider Notified: Dr. Denton Brick  Orders Received? Or Actions Taken?: Actions Taken: no orders received

## 2019-06-29 ENCOUNTER — Observation Stay (HOSPITAL_BASED_OUTPATIENT_CLINIC_OR_DEPARTMENT_OTHER): Payer: Medicare Other

## 2019-06-29 ENCOUNTER — Observation Stay (HOSPITAL_COMMUNITY): Payer: Medicare Other

## 2019-06-29 DIAGNOSIS — I361 Nonrheumatic tricuspid (valve) insufficiency: Secondary | ICD-10-CM

## 2019-06-29 DIAGNOSIS — R001 Bradycardia, unspecified: Secondary | ICD-10-CM

## 2019-06-29 DIAGNOSIS — E875 Hyperkalemia: Secondary | ICD-10-CM | POA: Diagnosis present

## 2019-06-29 DIAGNOSIS — R079 Chest pain, unspecified: Secondary | ICD-10-CM | POA: Diagnosis not present

## 2019-06-29 DIAGNOSIS — Z951 Presence of aortocoronary bypass graft: Secondary | ICD-10-CM | POA: Diagnosis not present

## 2019-06-29 DIAGNOSIS — I4510 Unspecified right bundle-branch block: Secondary | ICD-10-CM | POA: Diagnosis present

## 2019-06-29 DIAGNOSIS — Z96651 Presence of right artificial knee joint: Secondary | ICD-10-CM | POA: Diagnosis present

## 2019-06-29 DIAGNOSIS — I080 Rheumatic disorders of both mitral and aortic valves: Secondary | ICD-10-CM | POA: Diagnosis present

## 2019-06-29 DIAGNOSIS — R0789 Other chest pain: Secondary | ICD-10-CM | POA: Diagnosis not present

## 2019-06-29 DIAGNOSIS — M25552 Pain in left hip: Secondary | ICD-10-CM | POA: Diagnosis present

## 2019-06-29 DIAGNOSIS — I130 Hypertensive heart and chronic kidney disease with heart failure and stage 1 through stage 4 chronic kidney disease, or unspecified chronic kidney disease: Secondary | ICD-10-CM | POA: Diagnosis not present

## 2019-06-29 DIAGNOSIS — I10 Essential (primary) hypertension: Secondary | ICD-10-CM

## 2019-06-29 DIAGNOSIS — F329 Major depressive disorder, single episode, unspecified: Secondary | ICD-10-CM | POA: Diagnosis present

## 2019-06-29 DIAGNOSIS — E785 Hyperlipidemia, unspecified: Secondary | ICD-10-CM | POA: Diagnosis present

## 2019-06-29 DIAGNOSIS — R5381 Other malaise: Secondary | ICD-10-CM | POA: Diagnosis not present

## 2019-06-29 DIAGNOSIS — I252 Old myocardial infarction: Secondary | ICD-10-CM | POA: Diagnosis not present

## 2019-06-29 DIAGNOSIS — I350 Nonrheumatic aortic (valve) stenosis: Secondary | ICD-10-CM | POA: Diagnosis not present

## 2019-06-29 DIAGNOSIS — Z955 Presence of coronary angioplasty implant and graft: Secondary | ICD-10-CM | POA: Diagnosis not present

## 2019-06-29 DIAGNOSIS — I69322 Dysarthria following cerebral infarction: Secondary | ICD-10-CM | POA: Diagnosis not present

## 2019-06-29 DIAGNOSIS — I2510 Atherosclerotic heart disease of native coronary artery without angina pectoris: Secondary | ICD-10-CM | POA: Diagnosis present

## 2019-06-29 DIAGNOSIS — N183 Chronic kidney disease, stage 3 (moderate): Secondary | ICD-10-CM | POA: Diagnosis present

## 2019-06-29 DIAGNOSIS — I248 Other forms of acute ischemic heart disease: Secondary | ICD-10-CM | POA: Diagnosis present

## 2019-06-29 DIAGNOSIS — I249 Acute ischemic heart disease, unspecified: Secondary | ICD-10-CM | POA: Diagnosis not present

## 2019-06-29 DIAGNOSIS — I5032 Chronic diastolic (congestive) heart failure: Secondary | ICD-10-CM | POA: Diagnosis not present

## 2019-06-29 DIAGNOSIS — Z20828 Contact with and (suspected) exposure to other viral communicable diseases: Secondary | ICD-10-CM | POA: Diagnosis present

## 2019-06-29 DIAGNOSIS — I340 Nonrheumatic mitral (valve) insufficiency: Secondary | ICD-10-CM

## 2019-06-29 DIAGNOSIS — D509 Iron deficiency anemia, unspecified: Secondary | ICD-10-CM | POA: Diagnosis present

## 2019-06-29 DIAGNOSIS — F0150 Vascular dementia without behavioral disturbance: Secondary | ICD-10-CM | POA: Diagnosis present

## 2019-06-29 DIAGNOSIS — N179 Acute kidney failure, unspecified: Secondary | ICD-10-CM | POA: Diagnosis not present

## 2019-06-29 DIAGNOSIS — E1122 Type 2 diabetes mellitus with diabetic chronic kidney disease: Secondary | ICD-10-CM | POA: Diagnosis present

## 2019-06-29 DIAGNOSIS — R296 Repeated falls: Secondary | ICD-10-CM | POA: Diagnosis not present

## 2019-06-29 DIAGNOSIS — R531 Weakness: Secondary | ICD-10-CM | POA: Diagnosis not present

## 2019-06-29 LAB — IRON AND TIBC
Iron: 31 ug/dL (ref 28–170)
Saturation Ratios: 12 % (ref 10.4–31.8)
TIBC: 262 ug/dL (ref 250–450)
UIBC: 231 ug/dL

## 2019-06-29 LAB — BASIC METABOLIC PANEL
Anion gap: 10 (ref 5–15)
BUN: 54 mg/dL — ABNORMAL HIGH (ref 8–23)
CO2: 21 mmol/L — ABNORMAL LOW (ref 22–32)
Calcium: 9 mg/dL (ref 8.9–10.3)
Chloride: 109 mmol/L (ref 98–111)
Creatinine, Ser: 1.97 mg/dL — ABNORMAL HIGH (ref 0.44–1.00)
GFR calc Af Amer: 26 mL/min — ABNORMAL LOW (ref >60)
GFR calc non Af Amer: 23 mL/min — ABNORMAL LOW (ref >60)
Glucose, Bld: 190 mg/dL — ABNORMAL HIGH (ref 70–99)
Potassium: 4.4 mmol/L (ref 3.5–5.1)
Sodium: 140 mmol/L (ref 135–145)

## 2019-06-29 LAB — HEPARIN LEVEL (UNFRACTIONATED): Heparin Unfractionated: 0.16 IU/mL — ABNORMAL LOW (ref 0.30–0.70)

## 2019-06-29 LAB — GLUCOSE, CAPILLARY
Glucose-Capillary: 155 mg/dL — ABNORMAL HIGH (ref 70–99)
Glucose-Capillary: 126 mg/dL — ABNORMAL HIGH (ref 70–99)
Glucose-Capillary: 52 mg/dL — ABNORMAL LOW (ref 70–99)
Glucose-Capillary: 73 mg/dL (ref 70–99)
Glucose-Capillary: 143 mg/dL — ABNORMAL HIGH (ref 70–99)

## 2019-06-29 LAB — FERRITIN: Ferritin: 49 ng/mL (ref 11–307)

## 2019-06-29 LAB — ECHOCARDIOGRAM COMPLETE
Height: 66 in
Weight: 2483.26 oz

## 2019-06-29 LAB — HEMOGLOBIN A1C
Hgb A1c MFr Bld: 8.5 % — ABNORMAL HIGH (ref 4.8–5.6)
Mean Plasma Glucose: 197.25 mg/dL

## 2019-06-29 MED ORDER — DEXTROSE 50 % IV SOLN
INTRAVENOUS | Status: AC
  Filled 2019-06-29: qty 50

## 2019-06-29 MED ORDER — HYDRALAZINE HCL 10 MG PO TABS
10.0000 mg | ORAL_TABLET | Freq: Three times a day (TID) | ORAL | Status: DC
  Filled 2019-06-29: qty 1

## 2019-06-29 MED ORDER — HYDRALAZINE HCL 25 MG PO TABS: 25.0000 mg | ORAL_TABLET | Freq: Three times a day (TID) | ORAL | Status: DC

## 2019-06-29 MED ORDER — AMLODIPINE BESYLATE 5 MG PO TABS
5.0000 mg | ORAL_TABLET | Freq: Every day | ORAL | Status: DC
  Administered 2019-06-29 – 2019-07-01 (×3): 5 mg via ORAL
  Filled 2019-06-29 (×3): qty 1

## 2019-06-29 MED ORDER — ASPIRIN 81 MG PO CHEW
81.0000 mg | CHEWABLE_TABLET | Freq: Every day | ORAL | Status: DC
  Administered 2019-06-29 – 2019-07-02 (×4): 81 mg via ORAL
  Filled 2019-06-29 (×4): qty 1

## 2019-06-29 MED ORDER — CLOPIDOGREL BISULFATE 75 MG PO TABS
75.0000 mg | ORAL_TABLET | Freq: Every day | ORAL | Status: DC
  Administered 2019-06-29 – 2019-07-02 (×4): 75 mg via ORAL
  Filled 2019-06-29 (×4): qty 1

## 2019-06-29 NOTE — Evaluation (Signed)
Physical Therapy Evaluation Patient Details Name: [name] MRN: [id number] DOB: [birth year] Today's Date: 06/29/2019   History of Present Illness  85 year old female with history of CAD status post CABG in 2006, CVA, vascular dementia, carotid artery stenosis, hypertension, stage III kidney disease, type 2 diabetes, dyslipidemia, chronic diastolic CHF presented to the ED with worsening weakness, recent multiple falls, upon arrival to the ED she was noted to be bradycardic with heart rate in the 30s to mid 40s    Clinical Impression  Pt incontinent of urine when up.  Pt walked to bathroom and back with walker with poor technque. Pt worked on standing taller and longer strides.  Pt said she does well at home.  Pt reports she has had [place] before.  She reports her last 2 falls were at her steps - family building ramp now.  HR stayed between 48 and 58bpm.  No dizziness.    Follow Up Recommendations Home health PT;Supervision/Assistance - 24 hour    Equipment Recommendations  None recommended by PT    Recommendations for Other Services       Precautions / Restrictions Precautions Precautions: Fall Precaution Comments: pt reports only 2 recent falls - both on her steps and her son in law is going to build her a ramp she says Restrictions Weight Bearing Restrictions: No Other Position/Activity Restrictions: pt incontinent of urine with activity      Mobility  Bed Mobility Overal bed mobility: Needs Assistance Bed Mobility: Supine to Sit     Supine to sit: Min assist     General bed mobility comments: pt reports she is abel to get out of her bed at home with no problems  Transfers Overall transfer level: Needs assistance Equipment used: Rolling walker (2 wheeled) Transfers: Sit to/from Stand Sit to Stand: Min guard         General transfer comment: pt stands wtih flexed posture -n eeded cues for hand placement and standing more erect.  pt relies heavily on her arms for up  and down  Ambulation/Gait Ambulation/Gait assistance: Min assist Gait Distance (Feet): 25 Feet Assistive device: Rolling walker (2 wheeled) Gait Pattern/deviations: Trunk flexed;Decreased step length - right;Decreased step length - left;Shuffle;Wide base of support     General Gait Details: pt with poor gait technque.  she keeps the rolling walker way in front of her and stands wtih flexed posture - doent have weight on her base of support safely.  i worked with pt on standing taller and taking longer steps. she denied dizziness  she was incontinent of urine while walking  Stairs            Wheelchair Mobility    Modified Rankin (Stroke Patients Only)       Balance Overall balance assessment: Needs assistance;History of Falls             Standing balance comment: pt stands with wide base and flexed trunk - leaning on things.  we worked on standing more upright. pt reports she keeps walker with her at all times                             Pertinent Vitals/Pain Pain Assessment: 0-10 Pain Score: 4  Pain Location: pt reports hurting in her groin/bottom - she said for 3 months - i will have nursing check for any issues Pain Descriptors / Indicators: Aching;Burning;Discomfort Pain Intervention(s): Repositioned;Monitored during session    Home Living Family/patient  expects to be discharged to:: Private residence Living Arrangements: Children Available Help at Discharge: Family Type of Home: House Home Access: (pt reports she has been in the house for about 5 months - it has 2 small steps an dshe has fallen on them twice.  son in law b uilding ramp)     Home Layout: One level Home Equipment: Walker - 4 wheels Additional Comments: pt said her walker and toilet work well for her - doesnt need any equipment    Prior Function Level of Independence: Independent with assistive device(s)         Comments: pt reports she was able to shower alone and get  dressed etc.     Hand Dominance        Extremity/Trunk Assessment        Lower Extremity Assessment Lower Extremity Assessment: Generalized weakness    Cervical / Trunk Assessment Cervical / Trunk Assessment: Kyphotic  Communication   Communication: Expressive difficulties  Cognition Arousal/Alertness: Awake/alert Behavior During Therapy: WFL for tasks assessed/performed Overall Cognitive Status: Within Functional Limits for tasks assessed                                        General Comments General comments (skin integrity, edema, etc.): pt complained of hurting in her groin - notified nursing who will check her when back in bed.    Exercises     Assessment/Plan    PT Assessment Patient needs continued PT services  PT Problem List Decreased strength;Decreased mobility;Decreased safety awareness;Decreased activity tolerance;Cardiopulmonary status limiting activity;Decreased balance;Decreased knowledge of use of DME       PT Treatment Interventions DME instruction;Therapeutic activities;Gait training;Therapeutic exercise;Patient/family education;Balance training;Functional mobility training    PT Goals (Current goals can be found in the Care Plan section)  Acute Rehab PT Goals Patient Stated Goal: to get back home and get stronger PT Goal Formulation: With patient Time For Goal Achievement: 07/06/19 Potential to Achieve Goals: Fair    Frequency Min 3X/week   Barriers to discharge        Co-evaluation               AM-PAC PT "6 Clicks" Mobility  Outcome Measure Help needed turning from your back to your side while in a flat bed without using bedrails?: A Little Help needed moving from lying on your back to sitting on the side of a flat bed without using bedrails?: A Little Help needed moving to and from a bed to a chair (including a wheelchair)?: A Little Help needed standing up from a chair using your arms (e.g., wheelchair or  bedside chair)?: A Little Help needed to walk in hospital room?: A Little Help needed climbing 3-5 steps with a railing? : A Lot 6 Click Score: 17    End of Session   Activity Tolerance: Patient tolerated treatment well;Patient limited by fatigue Patient left: in chair;with chair alarm set;with call bell/phone within reach Nurse Communication: Mobility status PT Visit Diagnosis: Unsteadiness on feet (R26.81);Other abnormalities of gait and mobility (R26.89);Repeated falls (R29.6);Muscle weakness (generalized) (M62.81);Difficulty in walking, not elsewhere classified (R26.2)    Time: 1355-1430 PT Time Calculation (min) (ACUTE ONLY): 35 min   Charges:   PT Evaluation $PT Eval Low Complexity: 1 Low PT Treatments $Gait Training: 8-22 mins $Therapeutic Activity: 8-22 mins        06/29/2019   Rande Lawman, PT  Loyal B[place] 06/29/2019, 2:44 PM

## 2019-06-29 NOTE — Progress Notes (Addendum)
PROGRESS NOTE    [name]  IPJ:[id number] DOB: 05/21/1931 DOA: 06/28/2019 PCP: [name], MD  Brief Narrative: 85 year old female with history of CAD status post CABG in 2006, CVA, vascular dementia, carotid artery stenosis, hypertension, stage III kidney disease, type 2 diabetes, dyslipidemia, chronic diastolic CHF presented to the ED with worsening weakness, recent multiple falls, upon arrival to the ED she was noted to be bradycardic with heart rate in the 30s to mid 40s with junctional rhythm, new RBBB, mildly elevated troponin subsequently transitioned to [facility] for cardiology evaluation   Assessment & Plan:   Symptomatic bradycardia -EKG noted junctional rhythm initially now in sinus bradycardia -Amlodipine and Lopressor at baseline these were held on admission last night, heart rate remains in the 40s -Cardiology following, monitor off AV nodal blocking agents -Physical therapy evaluation, ambulate -FU 2D echocardiogram -TSh within normal limits  Elevated troponin -Likely demand related, AS could be contributing -Initial EKG with some ST depressions which improved -Continue aspirin Plavix, holding beta-blocker -Follow-up echo, wall motion -discontinue IV heparin  Chronic diastolic CHF -Appears euvolemic, discontinue IV fluids  AKI on Stage III- chronic kidney disease -Creatinine higher than baseline, hold diuretics today, monitor -Discontinue IV fluids - hold Aldactone today  Hypertension -BP trending up, restart hydralazine, holding CCB and beta-blocker  Diabetes mellitus-random glucose 245 -Continue home levemir at reduced dose 15 units - SSI  Moderate aortic stenosis, CAD history - History of coronary angioplasty with 5 stents prior to 2005, CABG 2006, follows with Dr. Percival Spanish.  -Resume aspirin Plavix -Hold metoprolol for bradycardia.  CVA, Dementia and depression-patient mostly independent of her ADLs.  -Continue home Lipitor,  paroxetine  Chronic anemia-hemoglobin 9.3, last check in February 9.9.  Otherwise baseline ~11.  On home iron supplements. -Iron studies.   DVT prophylaxis: lovenox Code Status: Full Family Communication:  Family at bedside, will update daughter dated later today Disposition Plan:  Pending improvement Consults called: CArdiology    Procedures:   Antimicrobials:    Subjective: -Feels okay, laying in bed, currently getting an echocardiogram done, denies any specific complaints at this time, reports ongoing weakness for a few weeks, multiple recent falls  Objective: Vitals:   06/29/19 0047 06/29/19 0414 06/29/19 0815 06/29/19 1136  BP: (!) 149/53 (!) 159/53 (!) 157/56 (!) 152/57  Pulse: (!) 42 (!) 45 (!) 44 (!) 46  Resp:      Temp:  (!) 97.3 F (36.3 C) 98 F (36.7 C) 98 F (36.7 C)  TempSrc: Oral Oral Oral Oral  SpO2: 97% 94% 95% 96%  Weight:      Height:        Intake/Output Summary (Last 24 hours) at 06/29/2019 1203 Last data filed at 06/29/2019 1033 Gross per 24 hour  Intake 962.03 ml  Output 600 ml  Net 362.03 ml   Filed Weights   06/28/19 1800 06/28/19 1814 06/28/19 2153  Weight: 67.1 kg 70.3 kg 70.4 kg    Examination:  Gen: Really frail female, alert awake oriented x3, dysarthric speech HEENT: No JVD Lungs: Good air movement bilaterally, CTAB CVS: S1-S2/regular rate rhythm, systolic ejection murmur Abd: soft, Non tender, non distended, BS present Extremities: No edema Skin: no new rashes Psychiatry: Mood & affect appropriate.     Data Reviewed:   CBC: Recent Labs  Lab 06/28/19 1637  WBC 7.9  HGB 9.3*  HCT 28.6*  MCV 91.4  PLT 734   Basic Metabolic Panel: Recent Labs  Lab 06/28/19 1634 06/28/19 1637 06/29/19  0239  NA  --  136 140  K  --  5.3* 4.4  CL  --  107 109  CO2  --  19* 21*  GLUCOSE  --  245* 190*  BUN  --  54* 54*  CREATININE  --  1.98* 1.97*  CALCIUM  --  8.9 9.0  MG 1.8  --   --    GFR: Estimated Creatinine  Clearance: 19.5 mL/min (A) (by C-G formula based on SCr of 1.97 mg/dL (H)). Liver Function Tests: Recent Labs  Lab 06/28/19 1637  AST 15  ALT 11  ALKPHOS 54  BILITOT 0.6  PROT 6.4*  ALBUMIN 3.0*   No results for input(s): LIPASE, AMYLASE in the last 168 hours. No results for input(s): AMMONIA in the last 168 hours. Coagulation Profile: Recent Labs  Lab 06/28/19 1637  INR 1.1   Cardiac Enzymes: No results for input(s): CKTOTAL, CKMB, CKMBINDEX, TROPONINI in the last 168 hours. BNP (last 3 results) No results for input(s): PROBNP in the last 8760 hours. HbA1C: Recent Labs    06/29/19 0239  HGBA1C 8.5*   CBG: Recent Labs  Lab 06/28/19 2018 06/29/19 0627 06/29/19 1135  GLUCAP 254* 155* 126*   Lipid Profile: No results for input(s): CHOL, HDL, LDLCALC, TRIG, CHOLHDL, LDLDIRECT in the last 72 hours. Thyroid Function Tests: Recent Labs    06/28/19 1634  TSH 2.102   Anemia Panel: Recent Labs    06/29/19 0239  FERRITIN 49  TIBC 262  IRON 31   Urine analysis:    Component Value Date/Time   COLORURINE YELLOW 04/29/2018 1702   APPEARANCEUR HAZY (A) 04/29/2018 1702   APPEARANCEUR Clear 09/27/2017 1056   LABSPEC 1.015 04/29/2018 1702   PHURINE 5.0 04/29/2018 1702   GLUCOSEU NEGATIVE 04/29/2018 1702   HGBUR SMALL (A) 04/29/2018 1702   BILIRUBINUR NEGATIVE 04/29/2018 1702   BILIRUBINUR Negative 09/27/2017 1056   KETONESUR NEGATIVE 04/29/2018 1702   PROTEINUR 100 (A) 04/29/2018 1702   UROBILINOGEN negative 02/03/2016 1516   UROBILINOGEN 0.2 10/17/2015 1915   NITRITE NEGATIVE 04/29/2018 1702   LEUKOCYTESUR MODERATE (A) 04/29/2018 1702   LEUKOCYTESUR 2+ (A) 09/27/2017 1056   Sepsis Labs: @LABRCNTIP (procalcitonin:4,lacticidven:4)  ) Recent Results (from the past 240 hour(s))  SARS Coronavirus 2 (CEPHEID - Performed in [place] hospital lab), Hosp Order     Status: None   Collection Time: 06/28/19  5:58 PM   Specimen: Nasopharyngeal Swab  Result Value  Ref Range Status   SARS Coronavirus 2 NEGATIVE NEGATIVE Final    Comment: (NOTE) If result is NEGATIVE SARS-CoV-2 target nucleic acids are NOT DETECTED. The SARS-CoV-2 RNA is generally detectable in upper and lower  respiratory specimens during the acute phase of infection. The lowest  concentration of SARS-CoV-2 viral copies this assay can detect is 250  copies / mL. A negative result does not preclude SARS-CoV-2 infection  and should not be used as the sole basis for treatment or other  patient management decisions.  A negative result may occur with  improper specimen collection / handling, submission of specimen other  than nasopharyngeal swab, presence of viral mutation(s) within the  areas targeted by this assay, and inadequate number of viral copies  (<250 copies / mL). A negative result must be combined with clinical  observations, patient history, and epidemiological information. If result is POSITIVE SARS-CoV-2 target nucleic acids are DETECTED. The SARS-CoV-2 RNA is generally detectable in upper and lower  respiratory specimens dur ing the acute phase of infection.  Positive  results are indicative of active infection with SARS-CoV-2.  Clinical  correlation with patient history and other diagnostic information is  necessary to determine patient infection status.  Positive results do  not rule out bacterial infection or co-infection with other viruses. If result is PRESUMPTIVE POSTIVE SARS-CoV-2 nucleic acids MAY BE PRESENT.   A presumptive positive result was obtained on the submitted specimen  and confirmed on repeat testing.  While 2019 novel coronavirus  (SARS-CoV-2) nucleic acids may be present in the submitted sample  additional confirmatory testing may be necessary for epidemiological  and / or clinical management purposes  to differentiate between  SARS-CoV-2 and other Sarbecovirus currently known to infect humans.  If clinically indicated additional testing with an  alternate test  methodology [phone]) is advised. The SARS-CoV-2 RNA is generally  detectable in upper and lower respiratory sp ecimens during the acute  phase of infection. The expected result is Negative. Fact Sheet for Patients:  StrictlyIdeas.no Fact Sheet for Healthcare Providers: BankingDealers.co.za This test is not yet approved or cleared by the [place] FDA and has been authorized for detection and/or diagnosis of SARS-CoV-2 by FDA under an Emergency Use Authorization (EUA).  This EUA will remain in effect (meaning this test can be used) for the duration of the COVID-19 declaration under Section 564(b)(1) of the Act, 21 U.S.C. section 360bbb-3(b)(1), unless the authorization is terminated or revoked sooner. Performed at [facility], [address], [place], [place] [address]          Radiology Studies: Dg Chest 2 View  Result Date: 06/28/2019 CLINICAL DATA:  Weakness.  Left lower rib pain.  Fall last week. EXAM: CHEST - 2 VIEW COMPARISON:  Chest x-ray dated January 20, 2019. FINDINGS: Stable cardiomediastinal silhouette status post CABG. Atherosclerotic calcification of the aortic arch. Normal pulmonary vascularity. No focal consolidation, pleural effusion, or pneumothorax. No acute osseous abnormality. Chronic severe L1 compression deformity. IMPRESSION: No active cardiopulmonary disease. Electronically Signed   By: Titus Dubin M.D.   On: 06/28/2019 17:30   Ct Head Wo Contrast  Result Date: 06/29/2019 CLINICAL DATA:  85 year old female with multiple falls. EXAM: CT HEAD WITHOUT CONTRAST TECHNIQUE: Contiguous axial images were obtained from the base of the skull through the vertex without intravenous contrast. COMPARISON:  Head CT dated 01/31/2019 FINDINGS: Brain: There is moderate age-related atrophy and chronic microvascular ischemic changes. Old right occipital lobe and bilateral cerebellar infarcts with  encephalomalacia noted. There is no acute intracranial hemorrhage. No mass effect or midline shift. No extra-axial fluid collection. Vascular: No hyperdense vessel or unexpected calcification. Skull: Normal. Negative for fracture or focal lesion. Sinuses/Orbits: Diffuse mucoperiosteal thickening of paranasal sinuses. No air-fluid level. The mastoid air cells are clear. Other: None IMPRESSION: 1. No acute intracranial hemorrhage. 2. Age-related atrophy and chronic microvascular ischemic changes. 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 Electronically Signed   By: Anner Crete M.D.   On: 06/29/2019 01:24   Dg Hips Bilat W Or Wo Pelvis 2 Views  Result Date: 06/28/2019 CLINICAL DATA:  Fall 1 week ago with persistent left-sided hip pain, initial encounter EXAM: DG HIP (WITH OR WITHOUT PELVIS) 4V BILAT COMPARISON:  10/26/2016 FINDINGS: The pelvic ring is intact. Diffuse vascular calcifications are seen. No acute fracture or dislocation is noted. No soft tissue abnormality is seen. IMPRESSION: No acute abnormality noted. Electronically Signed   By: Inez Catalina M.D.   On: 06/28/2019 18:03        Scheduled Meds:  atorvastatin  20 mg Oral QHS   insulin aspart  0-9 Units Subcutaneous TID WC   insulin detemir  15 Units Subcutaneous q morning - 10a   mometasone-formoterol  2 puff Inhalation BID   pantoprazole  40 mg Oral Daily   PARoxetine  20 mg Oral q morning - 10a   Continuous Infusions:  sodium chloride 100 mL/hr at 06/28/19 2144   sodium chloride     heparin 1,050 Units/hr (06/29/19 0410)     LOS: 0 days    Time spent: 29min    Domenic Polite, MD [facility] Page via [email or web address], password [email or web address] After 7PM please contact night-coverage  06/29/2019, 12:03 PM

## 2019-06-29 NOTE — Progress Notes (Signed)
Spoke with Pt's daughter, [name] on the phone. She preferred [place] to call her cell phone because her home phone is broken. I tried to reach Pt's second daughter named Hilda Blades, but got information from her son that Hilda Blades was admitted in hospital due to heart attack today and Pt's family members don't want [place] to tell Pt.   All questions answered, and explained plan of care.   Pt appeared alert, awake and oriented x 4. Her HR was 50s-60s with sinus bradycardia on monitor. BP 160s-170s / 50s mmHg. No acute distress note. Will continue to monitor.  Kennyth Lose, RN

## 2019-06-29 NOTE — Consult Note (Addendum)
Cardiology Consultation:   Patient ID: KOOPER CHRISWELL MRN: [id number]; DOB: 1931/05/10  Admit date: 06/28/2019 Date of Consult: 06/29/2019  Primary Care Provider: [name], MD Primary Cardiologist: Minus Breeding, MD  Primary Electrophysiologist:  None    Patient Profile:   MINDA FAAS is a 85 y.o. female with a hx of CAD s/p CABG (2006), carotic artery stenosis, HTN with hx of hypertensive urgency, CKD stage III, DM, dyslipidemia, GERD, and chronic diastolic heart failure who is being seen today for the evaluation of bradycardia at the request of Dr. Broadus John.  History of Present Illness:   Ms. Slovacek is known to this service and last saw Dr. Percival Spanish in clinic on 02/15/19. Shce has a history of HTN, CVA, dementia and  CAD  She is s/p CABG in 2006   She also has a history of aortic stenosis.  In March the patient  was doing well without medication changes. She is maintained on ASA and plavix s/p CABG in 2006. She is also maintaind on norvasc 10 mg, hydralazine 50 mg TID, imdur 30 mg, and lopressor 50 mg BID, and spironolactone 50 mg daily. She also takes 60 mg lasix daily for dCHF.   She presented to [facility] ED 06/28/19 with weakness and being unable to complete ADLs. At baseline, she is independent with ADLs. She had a fall approximately 6 days ago and struck her head. She has had 2 falls in the past 1-2 weeks.On arrival, she was bradycardic with junctional rhythm and RBBB (new) with heart rates in the 40s. She denied chest pain, but had elevated hs troponin of 736 --> 657. She was transferred to [facility] for further management of her bradycardia and AKI. Cardiology was consulted.   On my interview, she has slurred speech which is her baseline. She is alert and oriented. She does state that over the past 2 weeks she has felt dizziness that is new for her, usually when she gets up in the morning. She also reports chest pain last spring that was intermittent, but also chest pain  occurrence about 2 weeks ago.  She is chest pain free now. She states this was not like her CP in 2005/2006.    EKG 06/28/19: junctional rhythm, HR 40, ST depression II, III, aVF, V5, V6, peaked T V2, V3, widened QRS 161 ms RBBB  EKG 06/29/19: sinus bradycardia vs ectopic atrial bradycardia, HR 43 widened QRS of 142 ms (improved) RBBB, ST depression seen yesterday resolved  Telemetry with what appears to be sinus bradycardia in the 40s with PACs   Heart Pathway Score:     Past Medical History:  Diagnosis Date   Anemia    Anxiety    Aortic stenosis    Arthritis    back, knees, and hips   Asthma    with allergies   Balance problems    CAD (coronary artery disease)    Carotid stenosis    CHF (congestive heart failure) ([place])    EF preserved Echo 2012   CKD (chronic kidney disease)    CVA (cerebral infarction)    x3, 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 Amedeo Plenty: Diverticulosis   COLONOSCOPY WITH PROPOFOL N/A 02/14/2019   Procedure: COLONOSCOPY WITH PROPOFOL;  Surgeon: Danie Binder, MD;  Location: [facility] ENDO [facility];  Service: Endoscopy;  Laterality: N/A;   CORONARY ANGIOPLASTY     prior to 2006 5 stents   CORONARY ARTERY BYPASS GRAFT  2006   [facility] [place]  April 2013   KNEE SURGERY Right    POLYPECTOMY  02/14/2019   Procedure: POLYPECTOMY;  Surgeon: Danie Binder, MD;  Location: [facility];  Service:  Endoscopy;;  cecal polyp   TOTAL ABDOMINAL HYSTERECTOMY  ~85 years old   complete, with tumor removal   TOTAL KNEE ARTHROPLASTY Right 08/13/2015   Procedure: RIGHT  TOTAL KNEE ARTHROPLASTY;  Surgeon: Paralee Cancel, MD;  Location: [facility] ORS;  Service: Orthopedics;  Laterality: Right;     Home Medications:  Prior to Admission medications   Medication Sig Start Date End Date Taking? Authorizing Provider  albuterol (PROVENTIL HFA;VENTOLIN HFA) 108 (90 Base) MCG/ACT inhaler Inhale 2 puffs into the lungs every 6 (six) hours as needed for wheezing or shortness of breath. 01/20/19  Yes Stacks, Cletus Gash, MD  amLODipine (NORVASC) 10 MG tablet TAKE 1 TABLET DAILY. Patient taking differently: Take 10 mg by mouth daily.  02/03/19  Yes [name], MD  aspirin 81 MG chewable tablet Chew 81 mg by mouth at bedtime. 08/04/10  Yes [provider]  atorvastatin (LIPITOR) 20 MG tablet Take 1 tablet (20 mg total) by mouth at bedtime. 10/18/18  Yes Stacks, Cletus Gash, MD  clopidogrel (PLAVIX) 75 MG tablet TAKE 1 TABLET DAILY. Patient taking differently: Take 75 mg by mouth daily.  02/03/19  Yes Stacks, Cletus Gash, MD  esomeprazole (NEXIUM) 40 MG capsule TAKE 1 CAPSULE BY MOUTH ONCE DAILY. Patient taking differently: Take 40 mg by mouth daily at 12 noon.  05/26/19  Yes [name], MD  ferrous sulfate 324 (65 Fe) MG TBEC Take 324 mg by mouth daily.    Yes [provider]  furosemide (LASIX) 20 MG tablet Take 60 mg by mouth every morning.    Yes [provider]  hydrALAZINE (APRESOLINE) 50 MG tablet TAKE 1 TABLET BY MOUTH 3 TIMES DAILY. Patient taking differently: Take 50 mg by mouth 3 (three) times daily.  05/26/19  Yes Stacks, Cletus Gash, MD  insulin lispro (HUMALOG KWIKPEN) 100 UNIT/ML KiwkPen Inject 0.04-0.1 mLs (4-10 Units total) into the skin daily as needed (high blood sugar). Patient taking differently: Inject 5-15 Units into the skin daily as needed (for high blood sugar levels over 250).  07/12/18   Yes Stacks, Cletus Gash, MD  isosorbide mononitrate (IMDUR) 30 MG 24 hr tablet Take 1 tablet (30 mg total) by mouth daily. 10/18/18  Yes Stacks, Cletus Gash, MD  LEVEMIR FLEXTOUCH 100 UNIT/ML Pen INJECT [place] MORNING Patient taking differently: Inject 24 Units into the skin every morning.  06/19/19  Yes Stacks, Cletus Gash, MD  metFORMIN (GLUCOPHAGE-XR) 500 MG 24 hr tablet TAKE 1 TABLET 2 TIMES A DAY WITH BREAKFAST & DINNER. Patient taking differently: Take 500 mg by mouth 2 (two) times a day. With breakfast and dinner 05/08/19  Yes [name], MD  methenamine (HIPREX) 1 g tablet Take 1 tablet (1 g total) by mouth 2 (two) times daily. 12/14/18  Yes [name], MD  metoprolol tartrate (LOPRESSOR) 50 MG tablet Take 1 tablet (50 mg total) by mouth 2 (two) times daily. 01/03/19  Yes Stacks, Cletus Gash, MD  Multiple Vitamins-Iron (DAILY VITAMINS/IRON/BETA CAROT PO) Take 1 tablet by mouth at bedtime.   Yes [provider]  nabumetone (RELAFEN) 500 MG tablet Take 1 tablet (500 mg total) by mouth 2 (two) times daily. For muscle and joint pain Patient taking differently: Take 500 mg by mouth 2 (two) times daily as needed for mild pain or moderate pain. For muscle and joint pain 04/20/19  Yes Stacks, Cletus Gash, MD  nitroGLYCERIN (NITROSTAT) 0.4 MG SL tablet PLACE (1) TABLET UNDER TONGUE EVERY 5 MINUTES UP TO (3) DOSES. IF NO RELIEF CALL 911. Patient taking differently: Place 0.4 mg under the tongue every 5 (five) minutes as needed for chest pain.  05/26/19  Yes Stacks, Cletus Gash, MD  omeprazole (PRILOSEC) 20 MG capsule 1 PO 30 MINS PRIOR TO BREAKFAST. Patient taking differently: Take 20 mg by mouth daily before breakfast.  02/14/19  Yes Fields, Sandi L, MD  PARoxetine (PAXIL) 20 MG tablet TAKE 1 TABLET BY MOUTH EVERY MORNING. Patient taking differently: Take 20 mg by mouth daily.  04/03/19  Yes Stacks, Cletus Gash, MD  polyethylene glycol [facility]) packet Take 17 g by mouth daily. Patient taking differently: Take 17 g by  mouth daily as needed for mild constipation or moderate constipation.  07/25/18  Yes Maudie Flakes, MD  spironolactone (ALDACTONE) 25 MG tablet TAKE (2) TABLETS DAILY IN THE MORNING. Patient taking differently: Take 50 mg by mouth every morning.  05/26/19  Yes [name], MD    Inpatient Medications: Scheduled Meds:  atorvastatin  20 mg Oral QHS   insulin aspart  0-9 Units Subcutaneous TID WC   insulin detemir  15 Units Subcutaneous q morning - 10a   mometasone-formoterol  2 puff Inhalation BID   pantoprazole  40 mg Oral Daily   PARoxetine  20 mg Oral q morning - 10a   Continuous Infusions:  sodium chloride 100 mL/hr at 06/28/19 2144   sodium chloride     heparin 1,050 Units/hr (06/29/19 0410)   PRN Meds: nitroGLYCERIN, ondansetron **OR** ondansetron (ZOFRAN) IV, polyethylene glycol  Allergies:    Allergies  Allergen Reactions   Advil [Ibuprofen] Swelling   Heparin Other (See Comments)    Confusion, hallucinations.    Propoxyphene N-Acetaminophen Other (See Comments)    Numbness all over. "floating" sensation.    Social History:   Social History   Socioeconomic History   Marital status: Widowed    Spouse name: Not on file   Number of children: 4   Years of education: 3   Highest education level: 3rd grade  Occupational History   Occupation: retired  Scientist, product/process development strain: Not very hard   Food insecurity    Worry: Never true    Inability: Never true   Transportation needs    Medical: No    Non-medical: No  Tobacco Use   Smoking status: Never Smoker   Smokeless tobacco: Never Used  Substance and Sexual Activity   Alcohol use: No   Drug use: No   Sexual activity: Not Currently  Lifestyle   Physical activity    Days per week: 0 days    Minutes per session: 0 min   Stress: To some extent  Relationships  Social connections    Talks on phone: More than three times a week    Gets together: More than three  times a week    Attends religious service: More than 4 times per year    Active member of club or organization: No    Attends meetings of clubs or organizations: Never    Relationship status: Widowed   Intimate partner violence    Fear of current or ex partner: No    Emotionally abused: No    Physically abused: No    Forced sexual activity: No  Other Topics Concern   Not on file  Social History Narrative   Not on file    Family History:    Family History  Problem Relation Age of Onset   Cancer Brother        porstate   Early death Sister    Heart disease Brother    Heart disease Brother    Heart attack Brother    Heart disease Brother    Heart attack Brother    Diabetes Sister    Heart attack Sister    Diabetes Sister    Osteoporosis Sister    Hypertension Sister    Heart attack Son    Early death Son    Pancreatitis Son    Heart attack Daughter 72     ROS:  Please see the history of present illness.   All other ROS reviewed and negative.     Physical Exam/Data:   Vitals:   06/28/19 2319 06/29/19 0047 06/29/19 0414 06/29/19 0815  BP: (!) 136/103 (!) 149/53 (!) 159/53 (!) 157/56  Pulse: (!) 45 (!) 42 (!) 45 (!) 44  Resp:      Temp: 97.7 F (36.5 C)  (!) 97.3 F (36.3 C) 98 F (36.7 C)  TempSrc: Oral Oral Oral Oral  SpO2: 96% 97% 94% 95%  Weight:      Height:        Intake/Output Summary (Last 24 hours) at 06/29/2019 0944 Last data filed at 06/29/2019 0831 Gross per 24 hour  Intake 962.03 ml  Output 300 ml  Net 662.03 ml   Last 3 Weights 06/28/2019 06/28/2019 06/28/2019  Weight (lbs) 155 lb 3.3 oz 155 lb 148 lb  Weight (kg) 70.4 kg 70.308 kg 67.132 kg     Body mass index is 25.05 kg/m.  General:  Elderly female, NAD, slurred speech HEENT: normal Neck: no JVD Vascular: exam difficult with AS murmur Cardiac:  Regular rhythm, bradycardic rate, 4/6 later peaking systolic murmur, I do not hear an S2 Lungs:  clear to auscultation  bilaterally, no wheezing, rhonchi or rales  Abd: soft, nontender, no hepatomegaly  Ext: no edema Musculoskeletal:  No deformities, BUE and BLE strength normal and equal Skin: warm and dry  Neuro:  Slurred speech - her baseline  Otherwsie deferred Psych:  Normal affect   EKG:  The EKG was personally reviewed and demonstrates:   EKG 06/28/19: junctional rhythm, HR 40, ST depression II, III, aVF, V5, V6, peaked T V2, V3, widened QRS 161 ms RBBB  EKG 06/29/19:  ectopic atrial rhythm, HR 43 widened QRS of 142 ms (improved) RBBB, ST depression seen yesterday resolved  Telemetry:  Telemetry was personally reviewed and demonstrates:  Sinus bradycardia with HR in the 40s  Relevant CV Studies:  Echo 06/29/19: pending read  Echo 10/08/18: Study Conclusions  - Left ventricle: The cavity size was normal. There was severe   concentric hypertrophy. Systolic function was  normal. The   estimated ejection fraction was in the range of 60% to 65%. Wall   motion was normal; there were no regional wall motion   abnormalities. Diastolic dysfunction with elevated LV filling   pressure. - Aortic valve: Calcified with reduced leaflet excursion. Moderate   stenosis. There was no regurgitation. Mean gradient (S): 18 mm   Hg. Peak gradient (S): 37 mm Hg. Valve area (VTI): 1.5 cm^2.   Valve area (Vmax): 1.52 cm^2. Valve area (Vmean): 1.53 cm^2. - Mitral valve: Heavy posterior MAC -leaflet thickening. Mild to   moderate stenosis. There was mild regurgitation. Mean gradient   (D): 7 mm Hg. Valve area by continuity equation (using LVOT   flow): 1.48 cm^2. - Left atrium: Severely dilated. - Atrial septum: No defect or patent foramen ovale was identified. - Inferior vena cava: The vessel was dilated. The respirophasic   diameter changes were blunted (< 50%), consistent with elevated   central venous pressure.  Laboratory Data:  High Sensitivity Troponin:   Recent Labs  Lab 06/28/19 1634 06/28/19 1809    TROPONINIHS 736* 657*     Cardiac EnzymesNo results for input(s): TROPONINI in the last 168 hours. No results for input(s): TROPIPOC in the last 168 hours.  Chemistry Recent Labs  Lab 06/28/19 1637 06/29/19 0239  NA 136 140  K 5.3* 4.4  CL 107 109  CO2 19* 21*  GLUCOSE 245* 190*  BUN 54* 54*  CREATININE 1.98* 1.97*  CALCIUM 8.9 9.0  GFRNONAA 22* 23*  GFRAA 26* 26*  ANIONGAP 10 10    Recent Labs  Lab 06/28/19 1637  PROT 6.4*  ALBUMIN 3.0*  AST 15  ALT 11  ALKPHOS 54  BILITOT 0.6   Hematology Recent Labs  Lab 06/28/19 1637  WBC 7.9  RBC 3.13*  HGB 9.3*  HCT 28.6*  MCV 91.4  MCH 29.7  MCHC 32.5  RDW 13.2  PLT 218   BNP Recent Labs  Lab 06/28/19 1651  BNP 487.0*    DDimer No results for input(s): DDIMER in the last 168 hours.   Radiology/Studies:  Dg Chest 2 View  Result Date: 06/28/2019 CLINICAL DATA:  Weakness.  Left lower rib pain.  Fall last week. EXAM: CHEST - 2 VIEW COMPARISON:  Chest x-ray dated January 20, 2019. FINDINGS: Stable cardiomediastinal silhouette status post CABG. Atherosclerotic calcification of the aortic arch. Normal pulmonary vascularity. No focal consolidation, pleural effusion, or pneumothorax. No acute osseous abnormality. Chronic severe L1 compression deformity. IMPRESSION: No active cardiopulmonary disease. Electronically Signed   By: Titus Dubin M.D.   On: 06/28/2019 17:30   Ct Head Wo Contrast  Result Date: 06/29/2019 CLINICAL DATA:  85 year old female with multiple falls. EXAM: CT HEAD WITHOUT CONTRAST TECHNIQUE: Contiguous axial images were obtained from the base of the skull through the vertex without intravenous contrast. COMPARISON:  Head CT dated 01/31/2019 FINDINGS: Brain: There is moderate age-related atrophy and chronic microvascular ischemic changes. 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 There is no acute intracranial hemorrhage. No mass effect or midline shift. No  extra-axial fluid collection. Vascular: No hyperdense vessel or unexpected calcification. Skull: Normal. Negative for fracture or focal lesion. Sinuses/Orbits: Diffuse mucoperiosteal thickening of paranasal sinuses. No air-fluid level. The mastoid air cells are clear. Other: None IMPRESSION: 1. No acute intracranial hemorrhage. 2. Age-related atrophy and chronic microvascular ischemic changes. 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 Electronically Signed   By: Anner Crete M.D.   On: 06/29/2019  01:24   Dg Hips Bilat W Or Wo Pelvis 2 Views  Result Date: 06/28/2019 CLINICAL DATA:  Fall 1 week ago with persistent left-sided hip pain, initial encounter EXAM: DG HIP (WITH OR WITHOUT PELVIS) 4V BILAT COMPARISON:  10/26/2016 FINDINGS: The pelvic ring is intact. Diffuse vascular calcifications are seen. No acute fracture or dislocation is noted. No soft tissue abnormality is seen. IMPRESSION: No acute abnormality noted. Electronically Signed   By: Inez Catalina M.D.   On: 06/28/2019 18:03    Assessment and Plan:   1. Dizziness/ near syncope   The patient has been found to be very bradycardic   HR 40s(junctional/ectopic atrial rhythm)   This is all in setting of aortic stenosis   - EKG with junctional rhythm, now sinus bradycardia (metoprolol held)   QRS is wider with RBBB -QRS duration has improved since yesterday (see above) Pt on multiple meds for BP (neg chronotropes and vasodilators)  REcomm:   Stop metoprolol    Hold amlodipine and lasix    Gradually add back amlodopine as bp tolerates    2. Aortic stenosis   - was moderate 10/2018, repeat echo pending from today   - may be contributing to her dizziness - last 2 falls the pt says she was not dizzy before the  falls   3. CAD s/p CABG 2006 following prior PCIs  Pt has had some chest pressure   Had had intermittently   Troponin is mildly elevated    WOuld recomm lexiscan myovue to eval for large are of ischemia     5. Chronic  diastolic heart failure - echo pending today - appears euvolemic, on home lasix regimen - would hold lasix for now, avoid over-diuresis   6. Carotid artery stenosis - unable to assess bruits given AS murmur - mild plaque in 2017   7. Hypertension 8. Hx of hypertensive urgency - pressures elevated today - all anti-hypertensives have been held Would add back amlodipine   Follow      9. Dyslipidemia -   Continue 20 mg lipitor   10. Acute on CKD stage III - - sCr  Stable at 1.97, baseline appears to be 1.2-1.3 - hold diuretic for now esp with dizziness and AS   - ran gentle fluids  Follow     For questions or updates, please contact [place] Please consult [email or web address] for contact info under   Signed, Ledora Bottcher, PA  06/29/2019 9:44 AM  Patient seen and examined  I have amended note above by A [facility] to reflect my findngs  Pt is an 85 yo with known CAD AS, CVDz, HTN CKD, DM, dastolic CHF  She presented to [facility] ED with weakness, dizziness   Had recent falls    On arrival patient bradycardic  Several of her antihypertensives have been held  ON exam, pt is currently comfortable HR in 70s (SR) Neck:  JVP is normal Lungs are relatively clear Cardiac exam:  RRR  Gr III/VI later peakng systolic murmur base   ABd with no hepatomegatly Ext are without edema  EKG as noted above  Initally junctional then ectopic atrial rhythm   Tele now with SR    Labs signif for trop peak of 736  Echo is pending    REcommend:  WIth symptoms of weakness and elev troponin, would rule out ischemia with lexiscan myovue Continue telemetry REsume antihypertensives slowly   Watch BP   WOuld add slowly  REcomm amldopine   Careful to  avoid hypotension given AS.  Dorris Carnes MD

## 2019-06-29 NOTE — Progress Notes (Signed)
ANTICOAGULATION CONSULT NOTE - [place] for heparin Indication: chest pain/ACS/NSTEMI  Allergies  Allergen Reactions  . Advil [Ibuprofen] Swelling  . Heparin Other (See Comments)    Confusion, hallucinations.   . Propoxyphene N-Acetaminophen Other (See Comments)    Numbness all over. "floating" sensation.    Patient Measurements: Height: 5\' 6"  (167.6 cm) Weight: 155 lb 3.3 oz (70.4 kg) IBW/kg (Calculated) : 59.3 Heparin Dosing Weight: 67 kg  Vital Signs: Temp: 97.7 F (36.5 C) (07/22 2319) Temp Source: Oral (07/23 0047) BP: 149/53 (07/23 0047) Pulse Rate: 42 (07/23 0047)  Labs: Recent Labs    06/28/19 1634 06/28/19 1637 06/28/19 1809 06/29/19 0239  HGB  --  9.3*  --   --   HCT  --  28.6*  --   --   PLT  --  218  --   --   APTT  --  30  --   --   LABPROT  --  13.6  --   --   INR  --  1.1  --   --   HEPARINUNFRC  --   --   --  0.16*  CREATININE  --  1.98*  --   --   TROPONINIHS 736*  --  657*  --     Estimated Creatinine Clearance: 19.4 mL/min (A) (by C-G formula based on SCr of 1.98 mg/dL (H)).   Medical History: Past Medical History:  Diagnosis Date  . Anemia   . Anxiety   . Aortic stenosis   . Arthritis    back, knees, and hips  . Asthma    with allergies  . Balance problems   . CAD (coronary artery disease)   . Carotid stenosis   . CHF (congestive heart failure) ([place])    EF preserved Echo 2012  . CKD (chronic kidney disease)   . 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 Dementia ([place])    "a little"  . Depression   . Diabetes mellitus    type 2  . GERD (gastroesophageal reflux disease)   . Hypertension   . Iron deficiency anemia 05/27/2011  . Myocardial infarction ([place])   . Renal insufficiency   . Scoliosis   . Vertigo    "when sugar gets low"  . Vision loss    left eye-"half blind"    Medications:  Medications Prior to  Admission  Medication Sig Dispense Refill Last Dose  . albuterol (PROVENTIL HFA;VENTOLIN HFA) 108 (90 Base) MCG/ACT inhaler Inhale 2 puffs into the lungs every 6 (six) hours as needed for wheezing or shortness of breath. 1 Inhaler 5 2 months  . amLODipine (NORVASC) 10 MG tablet TAKE 1 TABLET DAILY. (Patient taking differently: Take 10 mg by mouth daily. ) 30 tablet 5 06/28/2019 at Unknown time  . aspirin 81 MG chewable tablet Chew 81 mg by mouth at bedtime.   Past Week at Unknown time  . atorvastatin (LIPITOR) 20 MG tablet Take 1 tablet (20 mg total) by mouth at bedtime. 90 tablet 3 06/27/2019 at Unknown time  . clopidogrel (PLAVIX) 75 MG tablet TAKE 1 TABLET DAILY. (Patient taking differently: Take 75 mg by mouth daily. ) 30 tablet 5 06/28/2019 at Unknown time  . esomeprazole (NEXIUM) 40 MG capsule TAKE 1 CAPSULE BY MOUTH ONCE DAILY. (Patient taking differently: Take 40 mg by mouth daily at 12 noon. ) 30 capsule 1 06/28/2019 at  Unknown time  . ferrous sulfate 324 (65 Fe) MG TBEC Take 324 mg by mouth daily.    06/28/2019 at Unknown time  . furosemide (LASIX) 20 MG tablet Take 60 mg by mouth every morning.    06/28/2019 at Unknown time  . hydrALAZINE (APRESOLINE) 50 MG tablet TAKE 1 TABLET BY MOUTH 3 TIMES DAILY. (Patient taking differently: Take 50 mg by mouth 3 (three) times daily. ) 90 tablet 1 06/28/2019 at Unknown time  . insulin lispro (HUMALOG KWIKPEN) 100 UNIT/ML KiwkPen Inject 0.04-0.1 mLs (4-10 Units total) into the skin daily as needed (high blood sugar). (Patient taking differently: Inject 5-15 Units into the skin daily as needed (for high blood sugar levels over 250). ) 15 mL 5 unknown  . isosorbide mononitrate (IMDUR) 30 MG 24 hr tablet Take 1 tablet (30 mg total) by mouth daily. 90 tablet 3 06/28/2019 at Unknown time  . LEVEMIR FLEXTOUCH 100 UNIT/ML Pen INJECT 24 UNITS EACH MORNING (Patient taking differently: Inject 24 Units into the skin every morning. ) 6 mL 0 06/28/2019 at Unknown time  .  metFORMIN (GLUCOPHAGE-XR) 500 MG 24 hr tablet TAKE 1 TABLET 2 TIMES A DAY WITH BREAKFAST & DINNER. (Patient taking differently: Take 500 mg by mouth 2 (two) times a day. With breakfast and dinner) 60 tablet 0 06/28/2019 at Unknown time  . methenamine (HIPREX) 1 g tablet Take 1 tablet (1 g total) by mouth 2 (two) times daily. 60 tablet 5 06/28/2019 at Unknown time  . metoprolol tartrate (LOPRESSOR) 50 MG tablet Take 1 tablet (50 mg total) by mouth 2 (two) times daily. 180 tablet 2 06/28/2019 at 830  . Multiple Vitamins-Iron (DAILY VITAMINS/IRON/BETA CAROT PO) Take 1 tablet by mouth at bedtime.   06/27/2019 at Unknown time  . nabumetone (RELAFEN) 500 MG tablet Take 1 tablet (500 mg total) by mouth 2 (two) times daily. For muscle and joint pain (Patient taking differently: Take 500 mg by mouth 2 (two) times daily as needed for mild pain or moderate pain. For muscle and joint pain) 60 tablet 2 unknown  . nitroGLYCERIN (NITROSTAT) 0.4 MG SL tablet PLACE (1) TABLET UNDER TONGUE EVERY 5 MINUTES UP TO (3) DOSES. IF NO RELIEF CALL 911. (Patient taking differently: Place 0.4 mg under the tongue every 5 (five) minutes as needed for chest pain. ) 25 tablet 0 unknown  . omeprazole (PRILOSEC) 20 MG capsule 1 PO 30 MINS PRIOR TO BREAKFAST. (Patient taking differently: Take 20 mg by mouth daily before breakfast. ) 90 capsule 3 06/28/2019 at Unknown time  . PARoxetine (PAXIL) 20 MG tablet TAKE 1 TABLET BY MOUTH EVERY MORNING. (Patient taking differently: Take 20 mg by mouth daily. ) 90 tablet 0 06/28/2019 at Unknown time  . polyethylene glycol (MIRALAX) packet Take 17 g by mouth daily. (Patient taking differently: Take 17 g by mouth daily as needed for mild constipation or moderate constipation. ) 30 each 0 unknown  . spironolactone (ALDACTONE) 25 MG tablet TAKE (2) TABLETS DAILY IN THE MORNING. (Patient taking differently: Take 50 mg by mouth every morning. ) 60 tablet 1 06/28/2019 at Unknown time    Assessment: Pharmacy  consulted to dose heparin in patient with ACS/NSTEMI. Patient not on anticoagulation prior to admission.  Patient has listed allergy to heparin of "confusion" but benefits outweigh risk.  Initial heparin level subtherapeutic, no issues per RN. CT head negative this evening.  Goal of Therapy:  Heparin level 0.3-0.7 units/ml Monitor platelets by anticoagulation protocol: Yes   Plan:  -  Increase heparin to 1050 units/hr -Recheck heparin level in [place], PharmD, BCPS Clinical Pharmacist Please check AMION for all [place] numbers 06/29/2019

## 2019-06-29 NOTE — Progress Notes (Signed)
CCMD notified Pt had 2.04 seconds sinus pause on EKG monitor. Rechecked EKG 12 leads this morning showed ectopic Atrial brady with right BBB. No acute distress noted. BP remained stable. Continue to monitor.  Kennyth Lose, RN

## 2019-06-29 NOTE — Progress Notes (Addendum)
Pt transferred from [place] to 4E 23. HR 40s on arrival with junctional rhythm with right BBB on monitor, BP stable, on  Room air spo2 96%. She appeared alert , oriented x 4, mind slurred speech, followed commands. CHG bath given, room and equipment instructions given, CCMD called and verified with 2nd person. Pt's on Heparin 800 units/ hr and 0.9% NSS 100 ml/hr.   12.20 am pt was out of unit with a primary RN for CT scan head without contrast and back to the unit at 12:45 am. No acute distress noted. Her vital remained stable. Continue to monitor.  Kennyth Lose, RN

## 2019-06-29 NOTE — Progress Notes (Signed)
  Echocardiogram 2D Echocardiogram has been performed.  [name] 06/29/2019, 9:19 AM

## 2019-06-30 ENCOUNTER — Inpatient Hospital Stay (HOSPITAL_COMMUNITY): Payer: Medicare Other

## 2019-06-30 DIAGNOSIS — I249 Acute ischemic heart disease, unspecified: Secondary | ICD-10-CM

## 2019-06-30 DIAGNOSIS — R001 Bradycardia, unspecified: Secondary | ICD-10-CM

## 2019-06-30 LAB — BASIC METABOLIC PANEL
Anion gap: 10 (ref 5–15)
BUN: 41 mg/dL — ABNORMAL HIGH (ref 8–23)
CO2: 19 mmol/L — ABNORMAL LOW (ref 22–32)
Calcium: 9.3 mg/dL (ref 8.9–10.3)
Chloride: 111 mmol/L (ref 98–111)
Creatinine, Ser: 1.47 mg/dL — ABNORMAL HIGH (ref 0.44–1.00)
GFR calc Af Amer: 37 mL/min — ABNORMAL LOW (ref >60)
GFR calc non Af Amer: 32 mL/min — ABNORMAL LOW (ref >60)
Glucose, Bld: 183 mg/dL — ABNORMAL HIGH (ref 70–99)
Potassium: 4.3 mmol/L (ref 3.5–5.1)
Sodium: 140 mmol/L (ref 135–145)

## 2019-06-30 LAB — CBC
HCT: 30.7 % — ABNORMAL LOW (ref 36.0–46.0)
Hemoglobin: 10.1 g/dL — ABNORMAL LOW (ref 12.0–15.0)
MCH: 29.7 pg (ref 26.0–34.0)
MCHC: 32.9 g/dL (ref 30.0–36.0)
MCV: 90.3 fL (ref 80.0–100.0)
Platelets: 213 10*3/uL (ref 150–400)
RBC: 3.4 MIL/uL — ABNORMAL LOW (ref 3.87–5.11)
RDW: 13.3 % (ref 11.5–15.5)
WBC: 12.7 10*3/uL — ABNORMAL HIGH (ref 4.0–10.5)
nRBC: 0 % (ref 0.0–0.2)

## 2019-06-30 LAB — NM MYOCAR MULTI W/SPECT W/WALL MOTION / EF
Estimated workload: 1 METS
Exercise duration (min): 5 min
MPHR: 135 {beats}/min
Peak HR: 91 {beats}/min
Percent HR: 67 %
Rest HR: 68 {beats}/min

## 2019-06-30 LAB — GLUCOSE, CAPILLARY
Glucose-Capillary: 201 mg/dL — ABNORMAL HIGH (ref 70–99)
Glucose-Capillary: 239 mg/dL — ABNORMAL HIGH (ref 70–99)
Glucose-Capillary: 320 mg/dL — ABNORMAL HIGH (ref 70–99)
Glucose-Capillary: 298 mg/dL — ABNORMAL HIGH (ref 70–99)

## 2019-06-30 MED ORDER — HYDRALAZINE HCL 10 MG PO TABS
10.0000 mg | ORAL_TABLET | Freq: Three times a day (TID) | ORAL | Status: DC
  Administered 2019-06-30 – 2019-07-01 (×4): 10 mg via ORAL
  Filled 2019-06-30 (×4): qty 1

## 2019-06-30 MED ORDER — TECHNETIUM TC 99M TETROFOSMIN IV KIT
30.0000 | PACK | Freq: Once | INTRAVENOUS | Status: AC | PRN
  Administered 2019-06-30: 30 via INTRAVENOUS

## 2019-06-30 MED ORDER — REGADENOSON 0.4 MG/5ML IV SOLN
INTRAVENOUS | Status: AC
  Filled 2019-06-30: qty 5

## 2019-06-30 MED ORDER — ALBUTEROL SULFATE (2.5 MG/3ML) 0.083% IN NEBU
2.5000 mg | INHALATION_SOLUTION | RESPIRATORY_TRACT | Status: DC | PRN
  Filled 2019-06-30: qty 3

## 2019-06-30 MED ORDER — TECHNETIUM TC 99M TETROFOSMIN IV KIT
10.0000 | PACK | Freq: Once | INTRAVENOUS | Status: AC | PRN
  Administered 2019-06-30: 10 via INTRAVENOUS

## 2019-06-30 MED ORDER — REGADENOSON 0.4 MG/5ML IV SOLN
0.4000 mg | Freq: Once | INTRAVENOUS | Status: AC
  Administered 2019-06-30: 0.4 mg via INTRAVENOUS

## 2019-06-30 MED ORDER — HYDRALAZINE HCL 20 MG/ML IJ SOLN
10.0000 mg | Freq: Four times a day (QID) | INTRAMUSCULAR | Status: DC | PRN
  Administered 2019-06-30 (×3): 10 mg via INTRAVENOUS
  Filled 2019-06-30 (×3): qty 1

## 2019-06-30 MED ORDER — ALBUTEROL SULFATE (2.5 MG/3ML) 0.083% IN NEBU
2.5000 mg | INHALATION_SOLUTION | Freq: Three times a day (TID) | RESPIRATORY_TRACT | Status: DC
  Administered 2019-06-30 – 2019-07-02 (×8): 2.5 mg via RESPIRATORY_TRACT
  Filled 2019-06-30 (×8): qty 3

## 2019-06-30 NOTE — Progress Notes (Addendum)
PROGRESS NOTE    [name]  GTX:[id number] DOB: Dec 02, 1931 DOA: 06/28/2019 PCP: [name], MD  Brief Narrative: 85 year old female with history of CAD status post CABG in 2006, CVA, vascular dementia, carotid artery stenosis, hypertension, stage III kidney disease, type 2 diabetes, dyslipidemia, chronic diastolic CHF presented to the ED with worsening weakness, recent multiple falls, upon arrival to the ED she was noted to be bradycardic with heart rate in the 30s to mid 40s with junctional rhythm, new RBBB, mildly elevated troponin subsequently transferred to [facility] for cardiology evaluation, found to have moderate aortic stenosis, bradycardia improving off beta-blocker   Assessment & Plan:   Symptomatic bradycardia -EKG noted junctional rhythm initially, followed by sinus bradycardia -Amlodipine and Lopressor at baseline these were held on admission, heart rate improving, amlodipine was restarted, cardiology following -Unclear if her symptoms were secondary to symptomatic bradycardia versus aortic stenosis likely both -Cardiology following, monitor off AV nodal blocking agents -2D echocardiogram notes moderate AS slightly worse from prior, severely dilated left atrium and at least moderate LVH -TSh within normal limits  Moderate aortic stenosis -See discussion above, cardiology following, suspect progression of this is likely contributing some to her symptoms  Elevated troponin -Likely demand related, AS could be contributing -Initial EKG with some ST depressions which improved -Continue aspirin Plavix, holding beta-blocker -Cardiology following, underwent Myoview today  Chronic diastolic CHF -Appears euvolemic, discontinue IV fluids  AKI on Stage III- chronic kidney disease -Creatinine higher than baseline, hold diuretics today, monitor -Discontinue IV fluids - hold Aldactone today  Hypertension -BP trending up, restart hydralazine, amlodipine,  holding beta-blocker  Diabetes mellitus-random glucose 245 -Continue home levemir at reduced dose 15 units - SSI  Moderate aortic stenosis, CAD history - History of coronary angioplasty with 5 stents prior to 2005, CABG 2006, follows with Dr. Percival Spanish.  -Resume aspirin Plavix -Hold metoprolol for bradycardia.  CVA, Dementia and depression-patient mostly independent of her ADLs.  -Continue home Lipitor, paroxetine  Chronic anemia-hemoglobin 9.3, last check in February 9.9.  Otherwise baseline ~11.  On home iron supplements. -Iron studies.   DVT prophylaxis: lovenox Code Status: Full Family Communication:  no Family at bedside, will update dtr Disposition Plan:  Pending improvement Consults called: CArdiology    Procedures:   Antimicrobials:    Subjective: -Feels okay, no specific complaints, just back from my view, denies any chest pain dizziness or dyspnea at this time, per staff had some wheezing last night  Objective: Vitals:   06/30/19 0923 06/30/19 0925 06/30/19 0927 06/30/19 1128  BP: (!) 158/79 (!) 156/63 (!) 142/55 (!) 156/56  Pulse:      Resp:      Temp:    98.2 F (36.8 C)  TempSrc:    Oral  SpO2:    95%  Weight:      Height:        Intake/Output Summary (Last 24 hours) at 06/30/2019 1155 Last data filed at 06/30/2019 0600 Gross per 24 hour  Intake 250 ml  Output 900 ml  Net -650 ml   Filed Weights   06/28/19 1814 06/28/19 2153 06/30/19 0500  Weight: 70.3 kg 70.4 kg 91.2 kg    Examination:  Gen: Elderly frail chronically ill female, awake alert oriented x3, dysarthric speech,  HEENT: no JVD Lungs: Good air movement bilaterally, CTAB CVS: S1-S2/regular rate rhythm, systolic ejection murmur Abd: soft, Non tender, non distended, BS present Extremities: No edema Skin: no new rashes Psychiatry: Appropriate mood & affect appropriate.  Data Reviewed:   CBC: Recent Labs  Lab 06/28/19 1637 06/30/19 0418  WBC 7.9 12.7*  HGB 9.3*  10.1*  HCT 28.6* 30.7*  MCV 91.4 90.3  PLT 218 865   Basic Metabolic Panel: Recent Labs  Lab 06/28/19 1634 06/28/19 1637 06/29/19 0239 06/30/19 0418  NA  --  136 140 140  K  --  5.3* 4.4 4.3  CL  --  107 109 111  CO2  --  19* 21* 19*  GLUCOSE  --  245* 190* 183*  BUN  --  54* 54* 41*  CREATININE  --  1.98* 1.97* 1.47*  CALCIUM  --  8.9 9.0 9.3  MG 1.8  --   --   --    GFR: Estimated Creatinine Clearance: 31.8 mL/min (A) (by C-G formula based on SCr of 1.47 mg/dL (H)). Liver Function Tests: Recent Labs  Lab 06/28/19 1637  AST 15  ALT 11  ALKPHOS 54  BILITOT 0.6  PROT 6.4*  ALBUMIN 3.0*   No results for input(s): LIPASE, AMYLASE in the last 168 hours. No results for input(s): AMMONIA in the last 168 hours. Coagulation Profile: Recent Labs  Lab 06/28/19 1637  INR 1.1   Cardiac Enzymes: No results for input(s): CKTOTAL, CKMB, CKMBINDEX, TROPONINI in the last 168 hours. BNP (last 3 results) No results for input(s): PROBNP in the last 8760 hours. HbA1C: Recent Labs    06/29/19 0239  HGBA1C 8.5*   CBG: Recent Labs  Lab 06/29/19 1649 06/29/19 1737 06/29/19 2111 06/30/19 0638 06/30/19 1128  GLUCAP 52* 73 143* 201* 239*   Lipid Profile: No results for input(s): CHOL, HDL, LDLCALC, TRIG, CHOLHDL, LDLDIRECT in the last 72 hours. Thyroid Function Tests: Recent Labs    06/28/19 1634  TSH 2.102   Anemia Panel: Recent Labs    06/29/19 0239  FERRITIN 49  TIBC 262  IRON 31   Urine analysis:    Component Value Date/Time   COLORURINE YELLOW 04/29/2018 1702   APPEARANCEUR HAZY (A) 04/29/2018 1702   APPEARANCEUR Clear 09/27/2017 1056   LABSPEC 1.015 04/29/2018 1702   PHURINE 5.0 04/29/2018 1702   GLUCOSEU NEGATIVE 04/29/2018 1702   HGBUR SMALL (A) 04/29/2018 1702   BILIRUBINUR NEGATIVE 04/29/2018 1702   BILIRUBINUR Negative 09/27/2017 1056   KETONESUR NEGATIVE 04/29/2018 1702   PROTEINUR 100 (A) 04/29/2018 1702   UROBILINOGEN negative 02/03/2016  1516   UROBILINOGEN 0.2 10/17/2015 1915   NITRITE NEGATIVE 04/29/2018 1702   LEUKOCYTESUR MODERATE (A) 04/29/2018 1702   LEUKOCYTESUR 2+ (A) 09/27/2017 1056   Sepsis Labs: @LABRCNTIP (procalcitonin:4,lacticidven:4)  ) Recent Results (from the past 240 hour(s))  SARS Coronavirus 2 (CEPHEID - Performed in [place] hospital lab), Hosp Order     Status: None   Collection Time: 06/28/19  5:58 PM   Specimen: Nasopharyngeal Swab  Result Value Ref Range Status   SARS Coronavirus 2 NEGATIVE NEGATIVE Final    Comment: (NOTE) If result is NEGATIVE SARS-CoV-2 target nucleic acids are NOT DETECTED. The SARS-CoV-2 RNA is generally detectable in upper and lower  respiratory specimens during the acute phase of infection. The lowest  concentration of SARS-CoV-2 viral copies this assay can detect is 250  copies / mL. A negative result does not preclude SARS-CoV-2 infection  and should not be used as the sole basis for treatment or other  patient management decisions.  A negative result may occur with  improper specimen collection / handling, submission of specimen other  than nasopharyngeal swab, presence of  viral mutation(s) within the  areas targeted by this assay, and inadequate number of viral copies  (<250 copies / mL). A negative result must be combined with clinical  observations, patient history, and epidemiological information. If result is POSITIVE SARS-CoV-2 target nucleic acids are DETECTED. The SARS-CoV-2 RNA is generally detectable in upper and lower  respiratory specimens dur ing the acute phase of infection.  Positive  results are indicative of active infection with SARS-CoV-2.  Clinical  correlation with patient history and other diagnostic information is  necessary to determine patient infection status.  Positive results do  not rule out bacterial infection or co-infection with other viruses. If result is PRESUMPTIVE POSTIVE SARS-CoV-2 nucleic acids MAY BE PRESENT.   A  presumptive positive result was obtained on the submitted specimen  and confirmed on repeat testing.  While 2019 novel coronavirus  (SARS-CoV-2) nucleic acids may be present in the submitted sample  additional confirmatory testing may be necessary for epidemiological  and / or clinical management purposes  to differentiate between  SARS-CoV-2 and other Sarbecovirus currently known to infect humans.  If clinically indicated additional testing with an alternate test  methodology [phone]) is advised. The SARS-CoV-2 RNA is generally  detectable in upper and lower respiratory sp ecimens during the acute  phase of infection. The expected result is Negative. Fact Sheet for Patients:  StrictlyIdeas.no Fact Sheet for Healthcare Providers: BankingDealers.co.za This test is not yet approved or cleared by the [place] FDA and has been authorized for detection and/or diagnosis of SARS-CoV-2 by FDA under an Emergency Use Authorization (EUA).  This EUA will remain in effect (meaning this test can be used) for the duration of the COVID-19 declaration under Section 564(b)(1) of the Act, 21 U.S.C. section 360bbb-3(b)(1), unless the authorization is terminated or revoked sooner. Performed at [facility], [address], [place], [place] [address]          Radiology Studies: Dg Chest 2 View  Result Date: 06/28/2019 CLINICAL DATA:  Weakness.  Left lower rib pain.  Fall last week. EXAM: CHEST - 2 VIEW COMPARISON:  Chest x-ray dated January 20, 2019. FINDINGS: Stable cardiomediastinal silhouette status post CABG. Atherosclerotic calcification of the aortic arch. Normal pulmonary vascularity. No focal consolidation, pleural effusion, or pneumothorax. No acute osseous abnormality. Chronic severe L1 compression deformity. IMPRESSION: No active cardiopulmonary disease. Electronically Signed   By: Titus Dubin M.D.   On: 06/28/2019 17:30   Ct Head Wo  Contrast  Result Date: 06/29/2019 CLINICAL DATA:  85 year old female with multiple falls. EXAM: CT HEAD WITHOUT CONTRAST TECHNIQUE: Contiguous axial images were obtained from the base of the skull through the vertex without intravenous contrast. COMPARISON:  Head CT dated 01/31/2019 FINDINGS: Brain: There is moderate age-related atrophy and chronic microvascular ischemic changes. 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 There is no acute intracranial hemorrhage. No mass effect or midline shift. No extra-axial fluid collection. Vascular: No hyperdense vessel or unexpected calcification. Skull: Normal. Negative for fracture or focal lesion. Sinuses/Orbits: Diffuse mucoperiosteal thickening of paranasal sinuses. No air-fluid level. The mastoid air cells are clear. Other: None IMPRESSION: 1. No acute intracranial hemorrhage. 2. Age-related atrophy and chronic microvascular ischemic changes. 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 Electronically Signed   By: Anner Crete M.D.   On: 06/29/2019 01:24   Dg Hips Bilat W Or Wo Pelvis 2 Views  Result Date: 06/28/2019 CLINICAL DATA:  Fall 1 week ago with persistent left-sided hip pain, initial encounter  EXAM: DG HIP (WITH OR WITHOUT PELVIS) 4V BILAT COMPARISON:  10/26/2016 FINDINGS: The pelvic ring is intact. Diffuse vascular calcifications are seen. No acute fracture or dislocation is noted. No soft tissue abnormality is seen. IMPRESSION: No acute abnormality noted. Electronically Signed   By: Inez Catalina M.D.   On: 06/28/2019 18:03        Scheduled Meds: . albuterol  2.5 mg Nebulization TID  . amLODipine  5 mg Oral Daily  . aspirin  81 mg Oral Daily  . atorvastatin  20 mg Oral QHS  . clopidogrel  75 mg Oral Daily  . hydrALAZINE  10 mg Oral TID  . insulin aspart  0-9 Units Subcutaneous TID WC  . insulin detemir  15 Units Subcutaneous q morning - 10a  . mometasone-formoterol  2 puff  Inhalation BID  . pantoprazole  40 mg Oral Daily  . PARoxetine  20 mg Oral q morning - 10a  . regadenoson       Continuous Infusions:    LOS: 1 day    Time spent: 63min    Domenic Polite, MD [facility] Page via [email or web address], password [email or web address] After 7PM please contact night-coverage  06/30/2019, 11:55 AM

## 2019-06-30 NOTE — Progress Notes (Signed)
Pt complained having SOB with expiratory wheezing.Notified RT Lilia Pro at bedside for evaluation. Breathing treatment with Albuterol NB given by RT. Continue to monitor.   Kennyth Lose, RN

## 2019-06-30 NOTE — Progress Notes (Signed)
Progress Note  Patient Name: [name] Date of Encounter: 06/30/2019  Primary Cardiologist: Minus Breeding, MD   Subjective   Pt comfortable in bed   No CP    Inpatient Medications    Scheduled Meds: . albuterol  2.5 mg Nebulization TID  . amLODipine  5 mg Oral Daily  . aspirin  81 mg Oral Daily  . atorvastatin  20 mg Oral QHS  . clopidogrel  75 mg Oral Daily  . insulin aspart  0-9 Units Subcutaneous TID WC  . insulin detemir  15 Units Subcutaneous q morning - 10a  . mometasone-formoterol  2 puff Inhalation BID  . pantoprazole  40 mg Oral Daily  . PARoxetine  20 mg Oral q morning - 10a   Continuous Infusions:  PRN Meds: albuterol, hydrALAZINE, nitroGLYCERIN, ondansetron **OR** ondansetron (ZOFRAN) IV, polyethylene glycol   Vital Signs    Vitals:   06/30/19 0500 06/30/19 0530 06/30/19 0600 06/30/19 0700  BP: (!) 171/60  (!) 180/58 (!) 173/57  Pulse: 64 62 64 65  Resp: 20     Temp:      TempSrc:      SpO2: 95% 98% 98% 97%  Weight: 91.2 kg     Height:        Intake/Output Summary (Last 24 hours) at 06/30/2019 0729 Last data filed at 06/30/2019 0600 Gross per 24 hour  Intake 250 ml  Output 1200 ml  Net -950 ml   Last 3 Weights 06/30/2019 06/28/2019 06/28/2019  Weight (lbs) 201 lb 155 lb 3.3 oz 155 lb  Weight (kg) 91.173 kg 70.4 kg 70.308 kg      Telemetry    SR   60s-70s   - Personally Reviewed  ECG     - Personally Reviewed  Physical Exam   GEN: No acute distress.   Neck: No JVD Cardiac: RRR, Gr III/VI I systolic murmu base , rubs, or gallops.  Respiratory: Clear to auscultation bilaterally. GI: Soft, nontender, non-distended  MS: No edema; No deformity. Neuro:  Nonfocal  Psych: Normal affect   Labs    High Sensitivity Troponin:   Recent Labs  Lab 06/28/19 1634 06/28/19 1809  TROPONINIHS 736* 657*      Cardiac EnzymesNo results for input(s): TROPONINI in the last 168 hours. No results for input(s): TROPIPOC in the last 168 hours.    Chemistry Recent Labs  Lab 06/28/19 1637 06/29/19 0239 06/30/19 0418  NA 136 140 140  K 5.3* 4.4 4.3  CL 107 109 111  CO2 19* 21* 19*  GLUCOSE 245* 190* 183*  BUN 54* 54* 41*  CREATININE 1.98* 1.97* 1.47*  CALCIUM 8.9 9.0 9.3  PROT 6.4*  --   --   ALBUMIN 3.0*  --   --   AST 15  --   --   ALT 11  --   --   ALKPHOS 54  --   --   BILITOT 0.6  --   --   GFRNONAA 22* 23* 32*  GFRAA 26* 26* 37*  ANIONGAP 10 10 10      Hematology Recent Labs  Lab 06/28/19 1637 06/30/19 0418  WBC 7.9 12.7*  RBC 3.13* 3.40*  HGB 9.3* 10.1*  HCT 28.6* 30.7*  MCV 91.4 90.3  MCH 29.7 29.7  MCHC 32.5 32.9  RDW 13.2 13.3  PLT 218 213    BNP Recent Labs  Lab 06/28/19 1651  BNP 487.0*     DDimer No results for input(s): DDIMER in the  last 168 hours.   Radiology    Dg Chest 2 View  Result Date: 06/28/2019 CLINICAL DATA:  Weakness.  Left lower rib pain.  Fall last week. EXAM: CHEST - 2 VIEW COMPARISON:  Chest x-ray dated January 20, 2019. FINDINGS: Stable cardiomediastinal silhouette status post CABG. Atherosclerotic calcification of the aortic arch. Normal pulmonary vascularity. No focal consolidation, pleural effusion, or pneumothorax. No acute osseous abnormality. Chronic severe L1 compression deformity. IMPRESSION: No active cardiopulmonary disease. Electronically Signed   By: Titus Dubin M.D.   On: 06/28/2019 17:30   Ct Head Wo Contrast  Result Date: 06/29/2019 CLINICAL DATA:  85 year old female with multiple falls. EXAM: CT HEAD WITHOUT CONTRAST TECHNIQUE: Contiguous axial images were obtained from the base of the skull through the vertex without intravenous contrast. COMPARISON:  Head CT dated 01/31/2019 FINDINGS: Brain: There is moderate age-related atrophy and chronic microvascular ischemic changes. 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 There is no acute intracranial hemorrhage. No mass effect or midline shift. No extra-axial  fluid collection. Vascular: No hyperdense vessel or unexpected calcification. Skull: Normal. Negative for fracture or focal lesion. Sinuses/Orbits: Diffuse mucoperiosteal thickening of paranasal sinuses. No air-fluid level. The mastoid air cells are clear. Other: None IMPRESSION: 1. No acute intracranial hemorrhage. 2. Age-related atrophy and chronic microvascular ischemic changes. 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 Electronically Signed   By: Anner Crete M.D.   On: 06/29/2019 01:24   Dg Hips Bilat W Or Wo Pelvis 2 Views  Result Date: 06/28/2019 CLINICAL DATA:  Fall 1 week ago with persistent left-sided hip pain, initial encounter EXAM: DG HIP (WITH OR WITHOUT PELVIS) 4V BILAT COMPARISON:  10/26/2016 FINDINGS: The pelvic ring is intact. Diffuse vascular calcifications are seen. No acute fracture or dislocation is noted. No soft tissue abnormality is seen. IMPRESSION: No acute abnormality noted. Electronically Signed   By: Inez Catalina M.D.   On: 06/28/2019 18:03    Cardiac Studies   Echo 06/29/19  IMPRESSIONS    1. The left ventricle has normal systolic function with an ejection fraction of 60-65%. The cavity size was normal. There is moderate concentric left ventricular hypertrophy. Left ventricular diastolic Doppler parameters are consistent with  pseudonormalization. Elevated mean left atrial pressure.  2. The right ventricle has normal systolic function. The cavity was normal. There is no increase in right ventricular wall thickness. Right ventricular systolic pressure is mildly elevated with an estimated pressure of 36.6 mmHg.  3. Left atrial size was severely dilated.  4. The mitral valve is degenerative. Mild thickening of the mitral valve leaflet. Mild calcification of the mitral valve leaflet. There is severe mitral annular calcification present. Mitral valve regurgitation is mild to moderate by color flow Doppler.  Moderate mitral valve stenosis.  5. The aortic  valve is tricuspid. Moderate thickening of the aortic valve. Moderate calcification of the aortic valve. Moderate stenosis of the aortic valve.  6. The aorta is normal in size and structure.  7. The inferior vena cava was dilated in size with >50% respiratory variability.  8. When compared to the prior study: 10/08/2018, there is very slight worsening of the aortic stenosis. Mitral valve gradients appear lower due to bradycardia.  FINDINGS  Left Ventricle: The left ventricle has normal systolic function, with an ejection fraction of 60-65%. The cavity size was normal. There is moderate concentric left ventricular hypertrophy. Left ventricular diastolic Doppler parameters are consistent  with pseudonormalization. Elevated mean left atrial pressure  Right Ventricle:  The right ventricle has normal systolic function. The cavity was normal. There is no increase in right ventricular wall thickness. Right ventricular systolic pressure is mildly elevated with an estimated pressure of 36.6 mmHg.  Left Atrium: Left atrial size was severely dilated.  Right Atrium: Right atrial size was normal in size. Right atrial pressure is estimated at 10 mmHg.  Interatrial Septum: No atrial level shunt detected by color flow Doppler.  Pericardium: There is no evidence of pericardial effusion.  Mitral Valve: The mitral valve is degenerative in appearance. Mild thickening of the mitral valve leaflet. Mild calcification of the mitral valve leaflet. There is severe mitral annular calcification present. Mitral valve regurgitation is mild to  moderate by color flow Doppler. Moderate mitral valve stenosis. Gradients measured at 45 bpm.  Tricuspid Valve: The tricuspid valve is normal in structure. Tricuspid valve regurgitation is mild by color flow Doppler.  Aortic Valve: The aortic valve is tricuspid Moderate thickening of the aortic valve. Moderate calcification of the aortic valve. Aortic valve regurgitation was  not visualized by color flow Doppler. There is Moderate stenosis of the aortic valve, with a  calculated valve area of 1.42 cm.  Pulmonic Valve: The pulmonic valve was normal in structure. Pulmonic valve regurgitation is trivial by color flow Doppler.  Aorta: The aorta is normal in size and structure.  Venous: The inferior vena cava measures 2.50 cm, is dilated in size with greater than 50% respiratory variability.  Compared to previous exam: 10/08/2018, there is very slight worsening of the aortic stenosis. Mitral valve gradients appear lower due to bradycardia.    +--------------+--------++ LEFT VENTRICLE         +----------------+---------++ +--------------+--------++ Diastology                PLAX 2D                +----------------+---------++ +--------------+--------++ LV e' lateral:  4.81 cm/s LVIDd:        4.40 cm  +----------------+---------++ +--------------+--------++ LV E/e' lateral:37.0      LVIDs:        3.00 cm  +----------------+---------++ +--------------+--------++ LV e' medial:   3.64 cm/s LV PW:        1.40 cm  +----------------+---------++ +--------------+--------++ LV E/e' medial: 48.9      LV IVS:       1.50 cm  +----------------+---------++ +--------------+--------++ LVOT diam:    2.20 cm  +--------------+--------++ LV SV:        53 ml    +--------------+--------++ LV SV Index:  28.93    +--------------+--------++ LVOT Area:    3.80 cm +--------------+--------++                        +--------------+--------++  +---------------+----------++ RIGHT VENTRICLE           +---------------+----------++ RV S prime:    10.40 cm/s +---------------+----------++ TAPSE (M-mode):1.3 cm     +---------------+----------++ RVSP:          34.6 mmHg  +---------------+----------++  +---------------+--------++-----------++ LEFT ATRIUM            Index        +---------------+--------++-----------++ LA diam:       5.20 cm 2.90 cm/m  +---------------+--------++-----------++ LA Vol (A2C):  148.0 ml82.43 ml/m +---------------+--------++-----------++ LA Vol (A4C):  111.0 ml61.82 ml/m +---------------+--------++-----------++ LA Biplane Vol:128.0 ml71.29 ml/m +---------------+--------++-----------++ +------------+---------++-----------++ RIGHT ATRIUM         Index       +------------+---------++-----------++ RA Pressure:8.00 mmHg            +------------+---------++-----------++  RA Area:    12.50 cm            +------------+---------++-----------++ RA Volume:  26.30 ml 14.65 ml/m +------------+---------++-----------++  +------------------+------------++ AORTIC VALVE                   +------------------+------------++ AV Area (Vmax):   1.34 cm     +------------------+------------++ AV Area (Vmean):  1.46 cm     +------------------+------------++ AV Area (VTI):    1.42 cm     +------------------+------------++ AV Vmax:          322.50 cm/s  +------------------+------------++ AV Vmean:         219.000 cm/s +------------------+------------++ AV VTI:           0.934 m      +------------------+------------++ AV Peak Grad:     41.6 mmHg    +------------------+------------++ AV Mean Grad:     22.0 mmHg    +------------------+------------++ LVOT Vmax:        114.00 cm/s  +------------------+------------++ LVOT Vmean:       84.300 cm/s  +------------------+------------++ LVOT VTI:         0.348 m      +------------------+------------++ LVOT/AV VTI ratio:0.37         +------------------+------------++   +-------------+-------++ AORTA                +-------------+-------++ Ao Root diam:3.00 cm +-------------+-------++  +--------------+-----------++ +---------------+-----------++ MITRAL VALVE               TRICUSPID VALVE            +--------------+-----------++ +---------------+-----------++ MV Area (PHT):1.26 cm    TR Peak grad:  26.6 mmHg   +--------------+-----------++ +---------------+-----------++ MV Peak grad: 21.3 mmHg   TR Vmax:       293.00 cm/s +--------------+-----------++ +---------------+-----------++ MV Mean grad: 5.5 mmHg    Estimated RAP: 8.00 mmHg   +--------------+-----------++ +---------------+-----------++ MV Vmax:      2.31 m/s    RVSP:          34.6 mmHg   +--------------+-----------++ +---------------+-----------++ MV Vmean:     99.9 cm/s   +--------------+-----------++ +--------------+-------+ MV VTI:       0.95 m      SHUNTS                +--------------+-----------++ +--------------+-------+ MV PHT:       174.58 msec Systemic VTI: 0.35 m  +--------------+-----------++ +--------------+-------+ MV Decel Time:602 msec    Systemic Diam:2.20 cm +--------------+-----------++ +--------------+-------+ +--------------+-----------++ MV E velocity:178.00 cm/s +--------------+-----------++ MV A velocity:108.00 cm/s +--------------+-----------++ MV E/A ratio: 1.65        +--------------+-----------++  +---------+-------+ IVC              +---------+-------+ IVC diam:2.50 cm +---------+-------+    Sanda Klein MD Electronically signed by Sanda Klein MD Signature Date/Time: 06/29/2019/1:31:11 PM        Patient Profile     85 y.o. female with a hx of CAD s/p CABG (2006), carotic artery stenosis, HTN with hx of hypertensive urgency, CKD stage III, DM, dyslipidemia, GERD, and chronic diastolic heart failure who is being seen today for the evaluation of bradycardia at the request of Dr. Broadus John.   Assessment & Plan    1  Bradycardia  HR is improved with holding of  b blocker  Follow with tele  2  Aortic stenosis  Echo shows moderate AS     3 Dizziness  Pt denies   Will  check orthostatics  4  CAD  Chest pressure  Pt had myovue today  Waiting on results    5 Chronic diastolic CHF  Continue current regimen  Volume status overall OK       For questions or updates, please contact [facility] Please consult [email or web address] for contact info under        Signed, Dorris Carnes, MD  06/30/2019, 7:29 AM

## 2019-06-30 NOTE — Progress Notes (Addendum)
Pt had BP 160-170s /50s mmHg, HR 50-60. Her SPO2 dropped to 84-86 with room air from 98% with room air when Pt was fully awake and alert. Auscultated bilateral lungs bilateral expiratory wheezing, rhonchi and with diminished left and right lower lobes with louder systolic murmur. O2 NCL given titrated to 5 LPM to maintain her SPO2 > 90-92%. I notified on provider, NP Bodenhiemer. Order received for Hydralazine 10 mg PRN IV. Continue to monitor.  Kennyth Lose, RN

## 2019-07-01 LAB — BASIC METABOLIC PANEL
Anion gap: 9 (ref 5–15)
BUN: 42 mg/dL — ABNORMAL HIGH (ref 8–23)
CO2: 19 mmol/L — ABNORMAL LOW (ref 22–32)
Calcium: 9.4 mg/dL (ref 8.9–10.3)
Chloride: 107 mmol/L (ref 98–111)
Creatinine, Ser: 1.39 mg/dL — ABNORMAL HIGH (ref 0.44–1.00)
GFR calc Af Amer: 40 mL/min — ABNORMAL LOW (ref >60)
GFR calc non Af Amer: 34 mL/min — ABNORMAL LOW (ref >60)
Glucose, Bld: 252 mg/dL — ABNORMAL HIGH (ref 70–99)
Potassium: 4.1 mmol/L (ref 3.5–5.1)
Sodium: 135 mmol/L (ref 135–145)

## 2019-07-01 LAB — GLUCOSE, CAPILLARY
Glucose-Capillary: 207 mg/dL — ABNORMAL HIGH (ref 70–99)
Glucose-Capillary: 141 mg/dL — ABNORMAL HIGH (ref 70–99)
Glucose-Capillary: 178 mg/dL — ABNORMAL HIGH (ref 70–99)
Glucose-Capillary: 196 mg/dL — ABNORMAL HIGH (ref 70–99)

## 2019-07-01 LAB — CBC
HCT: 28.8 % — ABNORMAL LOW (ref 36.0–46.0)
Hemoglobin: 9.6 g/dL — ABNORMAL LOW (ref 12.0–15.0)
MCH: 29.9 pg (ref 26.0–34.0)
MCHC: 33.3 g/dL (ref 30.0–36.0)
MCV: 89.7 fL (ref 80.0–100.0)
Platelets: 210 10*3/uL (ref 150–400)
RBC: 3.21 MIL/uL — ABNORMAL LOW (ref 3.87–5.11)
RDW: 13.4 % (ref 11.5–15.5)
WBC: 10.8 10*3/uL — ABNORMAL HIGH (ref 4.0–10.5)
nRBC: 0 % (ref 0.0–0.2)

## 2019-07-01 MED ORDER — HYDRALAZINE HCL 25 MG PO TABS
25.0000 mg | ORAL_TABLET | Freq: Three times a day (TID) | ORAL | Status: DC
  Administered 2019-07-01 – 2019-07-02 (×3): 25 mg via ORAL
  Filled 2019-07-01 (×3): qty 1

## 2019-07-01 MED ORDER — INSULIN DETEMIR 100 UNIT/ML ~~LOC~~ SOLN
25.0000 [IU] | Freq: Every morning | SUBCUTANEOUS | Status: DC
  Administered 2019-07-01 – 2019-07-02 (×2): 25 [IU] via SUBCUTANEOUS
  Filled 2019-07-01 (×2): qty 0.25

## 2019-07-01 MED ORDER — AMLODIPINE BESYLATE 10 MG PO TABS
10.0000 mg | ORAL_TABLET | Freq: Every day | ORAL | Status: DC
  Administered 2019-07-02: 10 mg via ORAL
  Filled 2019-07-01: qty 1

## 2019-07-01 NOTE — Progress Notes (Signed)
Progress Note  Patient Name: [name] Date of Encounter: 07/01/2019  Primary Cardiologist: Minus Breeding, MD   Subjective   Pt comfortable in bed   Denies CP   About to eat  Inpatient Medications    Scheduled Meds: . albuterol  2.5 mg Nebulization TID  . amLODipine  5 mg Oral Daily  . aspirin  81 mg Oral Daily  . atorvastatin  20 mg Oral QHS  . clopidogrel  75 mg Oral Daily  . hydrALAZINE  10 mg Oral TID  . insulin aspart  0-9 Units Subcutaneous TID WC  . insulin detemir  25 Units Subcutaneous q morning - 10a  . mometasone-formoterol  2 puff Inhalation BID  . pantoprazole  40 mg Oral Daily  . PARoxetine  20 mg Oral q morning - 10a   Continuous Infusions:  PRN Meds: albuterol, hydrALAZINE, nitroGLYCERIN, ondansetron **OR** ondansetron (ZOFRAN) IV, polyethylene glycol   Vital Signs    Vitals:   06/30/19 1951 06/30/19 2001 07/01/19 0007 07/01/19 0448  BP:  (!) 161/54 (!) 180/69 (!) 176/56  Pulse:   75 77  Resp:   18 18  Temp:  98.1 F (36.7 C) 98.8 F (37.1 C) 99.4 F (37.4 C)  TempSrc:  Oral Oral Oral  SpO2: 95% 100% 93% 93%  Weight:      Height:        Intake/Output Summary (Last 24 hours) at 07/01/2019 0754 Last data filed at 07/01/2019 0530 Gross per 24 hour  Intake -  Output 300 ml  Net -300 ml   Last 3 Weights 06/30/2019 06/28/2019 06/28/2019  Weight (lbs) 201 lb 155 lb 3.3 oz 155 lb  Weight (kg) 91.173 kg 70.4 kg 70.308 kg      Telemetry    SR   70s   - Personally Reviewed  ECG     None - Personally Reviewed  Physical Exam   GEN: No acute distress.   Neck: JVP is normal   Cardiac: RRR, Gr III/VI I systolic murmu base , rubs, or gallops.  Respiratory: Clear to auscultation bilaterally. GI: Soft, nontender, non-distended  MS: No edema; No deformity. Neuro:  Nonfocal  Psych:   Seems a little confused  Labs    High Sensitivity Troponin:   Recent Labs  Lab 06/28/19 1634 06/28/19 1809  TROPONINIHS 736* 657*      Cardiac  EnzymesNo results for input(s): TROPONINI in the last 168 hours. No results for input(s): TROPIPOC in the last 168 hours.   Chemistry Recent Labs  Lab 06/28/19 1637 06/29/19 0239 06/30/19 0418 07/01/19 0315  NA 136 140 140 135  K 5.3* 4.4 4.3 4.1  CL 107 109 111 107  CO2 19* 21* 19* 19*  GLUCOSE 245* 190* 183* 252*  BUN 54* 54* 41* 42*  CREATININE 1.98* 1.97* 1.47* 1.39*  CALCIUM 8.9 9.0 9.3 9.4  PROT 6.4*  --   --   --   ALBUMIN 3.0*  --   --   --   AST 15  --   --   --   ALT 11  --   --   --   ALKPHOS 54  --   --   --   BILITOT 0.6  --   --   --   GFRNONAA 22* 23* 32* 34*  GFRAA 26* 26* 37* 40*  ANIONGAP 10 10 10 9      Hematology Recent Labs  Lab 06/28/19 1637 06/30/19 0418 07/01/19 0315  WBC 7.9  12.7* 10.8*  RBC 3.13* 3.40* 3.21*  HGB 9.3* 10.1* 9.6*  HCT 28.6* 30.7* 28.8*  MCV 91.4 90.3 89.7  MCH 29.7 29.7 29.9  MCHC 32.5 32.9 33.3  RDW 13.2 13.3 13.4  PLT 218 213 210    BNP Recent Labs  Lab 06/28/19 1651  BNP 487.0*     DDimer No results for input(s): DDIMER in the last 168 hours.   Radiology    Dg Chest 2 View  Result Date: 06/30/2019 CLINICAL DATA:  Chest pain. EXAM: CHEST - 2 VIEW COMPARISON:  Radiographs of June 28, 2019. FINDINGS: Stable cardiomegaly. Status post coronary bypass graft. Atherosclerosis of thoracic aorta is noted. No pneumothorax is noted. Left lung is clear. Minimal right basilar subsegmental atelectasis is noted with minimal right pleural effusion. The visualized skeletal structures are unremarkable. IMPRESSION: Minimal right basilar subsegmental atelectasis with minimal right pleural effusion. Aortic Atherosclerosis (ICD10-I70.0). Electronically Signed   By: Marijo Conception M.D.   On: 06/30/2019 13:48   Nm Myocar Multi W/spect W/wall Motion / Ef  Result Date: 06/30/2019  There was no ST segment deviation noted during stress.  Defect 1: There is a small defect of mild severity.  This is a low risk study.  Nuclear stress EF:  57%.  No significant reversible ischemia. LVEF 57% with normal wall motion. This is a low risk study.    Cardiac Studies   Echo 06/29/19  IMPRESSIONS    1. The left ventricle has normal systolic function with an ejection fraction of 60-65%. The cavity size was normal. There is moderate concentric left ventricular hypertrophy. Left ventricular diastolic Doppler parameters are consistent with  pseudonormalization. Elevated mean left atrial pressure.  2. The right ventricle has normal systolic function. The cavity was normal. There is no increase in right ventricular wall thickness. Right ventricular systolic pressure is mildly elevated with an estimated pressure of 36.6 mmHg.  3. Left atrial size was severely dilated.  4. The mitral valve is degenerative. Mild thickening of the mitral valve leaflet. Mild calcification of the mitral valve leaflet. There is severe mitral annular calcification present. Mitral valve regurgitation is mild to moderate by color flow Doppler.  Moderate mitral valve stenosis.  5. The aortic valve is tricuspid. Moderate thickening of the aortic valve. Moderate calcification of the aortic valve. Moderate stenosis of the aortic valve.  6. The aorta is normal in size and structure.  7. The inferior vena cava was dilated in size with >50% respiratory variability.  8. When compared to the prior study: 10/08/2018, there is very slight worsening of the aortic stenosis. Mitral valve gradients appear lower due to bradycardia.  FINDINGS  Left Ventricle: The left ventricle has normal systolic function, with an ejection fraction of 60-65%. The cavity size was normal. There is moderate concentric left ventricular hypertrophy. Left ventricular diastolic Doppler parameters are consistent  with pseudonormalization. Elevated mean left atrial pressure  Right Ventricle: The right ventricle has normal systolic function. The cavity was normal. There is no increase in right ventricular  wall thickness. Right ventricular systolic pressure is mildly elevated with an estimated pressure of 36.6 mmHg.  Left Atrium: Left atrial size was severely dilated.  Right Atrium: Right atrial size was normal in size. Right atrial pressure is estimated at 10 mmHg.  Interatrial Septum: No atrial level shunt detected by color flow Doppler.  Pericardium: There is no evidence of pericardial effusion.  Mitral Valve: The mitral valve is degenerative in appearance. Mild thickening of the mitral valve  leaflet. Mild calcification of the mitral valve leaflet. There is severe mitral annular calcification present. Mitral valve regurgitation is mild to  moderate by color flow Doppler. Moderate mitral valve stenosis. Gradients measured at 45 bpm.  Tricuspid Valve: The tricuspid valve is normal in structure. Tricuspid valve regurgitation is mild by color flow Doppler.  Aortic Valve: The aortic valve is tricuspid Moderate thickening of the aortic valve. Moderate calcification of the aortic valve. Aortic valve regurgitation was not visualized by color flow Doppler. There is Moderate stenosis of the aortic valve, with a  calculated valve area of 1.42 cm.  Pulmonic Valve: The pulmonic valve was normal in structure. Pulmonic valve regurgitation is trivial by color flow Doppler.  Aorta: The aorta is normal in size and structure.  Venous: The inferior vena cava measures 2.50 cm, is dilated in size with greater than 50% respiratory variability.  Compared to previous exam: 10/08/2018, there is very slight worsening of the aortic stenosis. Mitral valve gradients appear lower due to bradycardia.    +--------------+--------++ LEFT VENTRICLE         +----------------+---------++ +--------------+--------++ Diastology                PLAX 2D                +----------------+---------++ +--------------+--------++ LV e' lateral:  4.81 cm/s LVIDd:        4.40 cm   +----------------+---------++ +--------------+--------++ LV E/e' lateral:37.0      LVIDs:        3.00 cm  +----------------+---------++ +--------------+--------++ LV e' medial:   3.64 cm/s LV PW:        1.40 cm  +----------------+---------++ +--------------+--------++ LV E/e' medial: 48.9      LV IVS:       1.50 cm  +----------------+---------++ +--------------+--------++ LVOT diam:    2.20 cm  +--------------+--------++ LV SV:        53 ml    +--------------+--------++ LV SV Index:  28.93    +--------------+--------++ LVOT Area:    3.80 cm +--------------+--------++                        +--------------+--------++  +---------------+----------++ RIGHT VENTRICLE           +---------------+----------++ RV S prime:    10.40 cm/s +---------------+----------++ TAPSE (M-mode):1.3 cm     +---------------+----------++ RVSP:          34.6 mmHg  +---------------+----------++  +---------------+--------++-----------++ LEFT ATRIUM            Index       +---------------+--------++-----------++ LA diam:       5.20 cm 2.90 cm/m  +---------------+--------++-----------++ LA Vol (A2C):  148.0 ml82.43 ml/m +---------------+--------++-----------++ LA Vol (A4C):  111.0 ml61.82 ml/m +---------------+--------++-----------++ LA Biplane Vol:128.0 ml71.29 ml/m +---------------+--------++-----------++ +------------+---------++-----------++ RIGHT ATRIUM         Index       +------------+---------++-----------++ RA Pressure:8.00 mmHg            +------------+---------++-----------++ RA Area:    12.50 cm            +------------+---------++-----------++ RA Volume:  26.30 ml 14.65 ml/m +------------+---------++-----------++  +------------------+------------++ AORTIC VALVE                   +------------------+------------++ AV Area (Vmax):   1.34 cm      +------------------+------------++ AV Area (Vmean):  1.46 cm     +------------------+------------++ AV Area (VTI):    1.42 cm     +------------------+------------++ AV  Vmax:          322.50 cm/s  +------------------+------------++ AV Vmean:         219.000 cm/s +------------------+------------++ AV VTI:           0.934 m      +------------------+------------++ AV Peak Grad:     41.6 mmHg    +------------------+------------++ AV Mean Grad:     22.0 mmHg    +------------------+------------++ LVOT Vmax:        114.00 cm/s  +------------------+------------++ LVOT Vmean:       84.300 cm/s  +------------------+------------++ LVOT VTI:         0.348 m      +------------------+------------++ LVOT/AV VTI ratio:0.37         +------------------+------------++   +-------------+-------++ AORTA                +-------------+-------++ Ao Root diam:3.00 cm +-------------+-------++  +--------------+-----------++ +---------------+-----------++ MITRAL VALVE              TRICUSPID VALVE            +--------------+-----------++ +---------------+-----------++ MV Area (PHT):1.26 cm    TR Peak grad:  26.6 mmHg   +--------------+-----------++ +---------------+-----------++ MV Peak grad: 21.3 mmHg   TR Vmax:       293.00 cm/s +--------------+-----------++ +---------------+-----------++ MV Mean grad: 5.5 mmHg    Estimated RAP: 8.00 mmHg   +--------------+-----------++ +---------------+-----------++ MV Vmax:      2.31 m/s    RVSP:          34.6 mmHg   +--------------+-----------++ +---------------+-----------++ MV Vmean:     99.9 cm/s   +--------------+-----------++ +--------------+-------+ MV VTI:       0.95 m      SHUNTS                +--------------+-----------++ +--------------+-------+ MV PHT:       174.58 msec Systemic VTI: 0.35 m  +--------------+-----------++  +--------------+-------+ MV Decel Time:602 msec    Systemic Diam:2.20 cm +--------------+-----------++ +--------------+-------+ +--------------+-----------++ MV E velocity:178.00 cm/s +--------------+-----------++ MV A velocity:108.00 cm/s +--------------+-----------++ MV E/A ratio: 1.65        +--------------+-----------++  +---------+-------+ IVC              +---------+-------+ IVC diam:2.50 cm +---------+-------+    Sanda Klein MD Electronically signed by Sanda Klein MD Signature Date/Time: 06/29/2019/1:31:11 PM        Patient Profile     85 y.o. female with a hx of CAD s/p CABG (2006), carotic artery stenosis, HTN with hx of hypertensive urgency, CKD stage III, DM, dyslipidemia, GERD, and chronic diastolic heart failure who is being seen today for the evaluation of bradycardia at the request of Dr. Broadus John.   Assessment & Plan    1  Bradycardia   REsolved off of b blocker   Follow     2  Aortic stenosis  Echo shows moderate AS     3 Dizziness BP is improved  Actually high    WIll increase meds  Pt needs to ambulate  Orthostatics were neg yesterday   4   HTN   As above   WIll icnrease amlodipine back to 10 and hydralazine up t o25 tid   Follow BP and symptoms   Better to run a little high  5 CAD  Chest pressure  Myovue is normal      6 Chronic diastolic CHF    Volume is OK     Pt has not been up much  Not sure how  she will be at home    From cardiac standpint OK to d/c home       For questions or updates, please contact [place] [place] Please consult [email or web address] for contact info under        Signed, Dorris Carnes, MD  07/01/2019, 7:54 AM

## 2019-07-01 NOTE — Progress Notes (Signed)
PROGRESS NOTE    [name]  XHB:[id number] DOB: [birth year] DOA: 06/28/2019 PCP: [name], MD  Brief Narrative: 85 year old female with history of CAD status post CABG in 2006, CVA, vascular dementia, carotid artery stenosis, hypertension, stage III kidney disease, type 2 diabetes, dyslipidemia, chronic diastolic CHF presented to the ED with worsening weakness, recent multiple falls, upon arrival to the ED she was noted to be bradycardic with heart rate in the 30s to mid 40s with junctional rhythm, new RBBB, mildly elevated troponin subsequently transferred to [facility] for cardiology evaluation, found to have moderate aortic stenosis, bradycardia improving off beta-blocker   Assessment & Plan:   Symptomatic bradycardia -EKG noted junctional rhythm initially, followed by sinus bradycardia -Resolved -Amlodipine and Lopressor at baseline these were held on admission, heart rate improving, amlodipine was restarted, cardiology following -Unclear if her symptoms were secondary to symptomatic bradycardia versus aortic stenosis likely both -Cardiology following, monitor off AV nodal blocking agents -2D echocardiogram notes moderate AS slightly worse from prior, severely dilated left atrium and at least moderate LVH,  -TSh within normal limits -Ambulate, out of bed, PT, discharge planning  Moderate aortic stenosis -See discussion above, cardiology following, suspect progression of this is likely contributing some to her symptoms -Follow-up with cardiology for this  Elevated troponin -Likely demand related, AS could be contributing -Initial EKG with some ST depressions which improved -Continue aspirin Plavix, holding beta-blocker -Cardiology following, underwent Myoview yesterday which was read as low risk  Chronic diastolic CHF -Appears euvolemic, discontinue IV fluids  AKI on Stage III- chronic kidney disease -Creatinine higher than baseline, hold diuretics today,  monitor -Discontinue IV fluids - hold Aldactone today  Hypertension -BP trending up, increased hydralazine and amlodipine, holding beta-blocker  Diabetes mellitus-random glucose 245 -CBGs uncontrolled, increase Levemir to 25 units  CAD history - History of coronary angioplasty with 5 stents prior to 2005, CABG 2006, follows with Dr. Percival Spanish.  -Resume aspirin Plavix -Hold metoprolol for bradycardia.  CVA, Dementia and depression-patient mostly independent of her ADLs.  -Continue home Lipitor, paroxetine  Chronic anemia-hemoglobin 9.3, last check in February 9.9.  Otherwise baseline ~11.  On home iron supplements. -Iron studies.  DVT prophylaxis: lovenox Code Status: Full Family Communication:  Family at bedside, called and updated daughter Betheny Suchecki Disposition Plan:  Home with home health services tomorrow Consults called: CArdiology    Procedures:   Antimicrobials:    Subjective: -Feels okay, no events overnight Objective: Vitals:   07/01/19 0007 07/01/19 0448 07/01/19 0858 07/01/19 0904  BP: (!) 180/69 (!) 176/56  (!) 140/105  Pulse: 75 77  88  Resp: 18 18    Temp: 98.8 F (37.1 C) 99.4 F (37.4 C)  98.2 F (36.8 C)  TempSrc: Oral Oral  Oral  SpO2: 93% 93% 95% 99%  Weight:      Height:        Intake/Output Summary (Last 24 hours) at 07/01/2019 1128 Last data filed at 07/01/2019 8101 Gross per 24 hour  Intake 120 ml  Output 300 ml  Net -180 ml   Filed Weights   06/28/19 1814 06/28/19 2153 06/30/19 0500  Weight: 70.3 kg 70.4 kg 91.2 kg    Examination:  Gen: Elderly frail chronically ill female, laying in bed, awake alert oriented x2, dysarthric speech HEENT: PERRLA, Neck supple, no JVD Lungs: Good air movement bilaterally, CTAB CVS: S1-S2/regular rate rhythm, systolic ejection murmur Abd: soft, Non tender, non distended, BS present Extremities: No edema Skin: no new rashes  Psychiatry: Appropriate mood & affect appropriate.      Data Reviewed:   CBC: Recent Labs  Lab 06/28/19 1637 06/30/19 0418 07/01/19 0315  WBC 7.9 12.7* 10.8*  HGB 9.3* 10.1* 9.6*  HCT 28.6* 30.7* 28.8*  MCV 91.4 90.3 89.7  PLT 218 213 315   Basic Metabolic Panel: Recent Labs  Lab 06/28/19 1634 06/28/19 1637 06/29/19 0239 06/30/19 0418 07/01/19 0315  NA  --  136 140 140 135  K  --  5.3* 4.4 4.3 4.1  CL  --  107 109 111 107  CO2  --  19* 21* 19* 19*  GLUCOSE  --  245* 190* 183* 252*  BUN  --  54* 54* 41* 42*  CREATININE  --  1.98* 1.97* 1.47* 1.39*  CALCIUM  --  8.9 9.0 9.3 9.4  MG 1.8  --   --   --   --    GFR: Estimated Creatinine Clearance: 33.7 mL/min (A) (by C-G formula based on SCr of 1.39 mg/dL (H)). Liver Function Tests: Recent Labs  Lab 06/28/19 1637  AST 15  ALT 11  ALKPHOS 54  BILITOT 0.6  PROT 6.4*  ALBUMIN 3.0*   No results for input(s): LIPASE, AMYLASE in the last 168 hours. No results for input(s): AMMONIA in the last 168 hours. Coagulation Profile: Recent Labs  Lab 06/28/19 1637  INR 1.1   Cardiac Enzymes: No results for input(s): CKTOTAL, CKMB, CKMBINDEX, TROPONINI in the last 168 hours. BNP (last 3 results) No results for input(s): PROBNP in the last 8760 hours. HbA1C: Recent Labs    06/29/19 0239  HGBA1C 8.5*   CBG: Recent Labs  Lab 06/30/19 0638 06/30/19 1128 06/30/19 1620 06/30/19 2259 07/01/19 0603  GLUCAP 201* 239* 320* 298* 207*   Lipid Profile: No results for input(s): CHOL, HDL, LDLCALC, TRIG, CHOLHDL, LDLDIRECT in the last 72 hours. Thyroid Function Tests: Recent Labs    06/28/19 1634  TSH 2.102   Anemia Panel: Recent Labs    06/29/19 0239  FERRITIN 49  TIBC 262  IRON 31   Urine analysis:    Component Value Date/Time   COLORURINE YELLOW 04/29/2018 1702   APPEARANCEUR HAZY (A) 04/29/2018 1702   APPEARANCEUR Clear 09/27/2017 1056   LABSPEC 1.015 04/29/2018 1702   PHURINE 5.0 04/29/2018 1702   GLUCOSEU NEGATIVE 04/29/2018 1702   HGBUR SMALL (A)  04/29/2018 1702   BILIRUBINUR NEGATIVE 04/29/2018 1702   BILIRUBINUR Negative 09/27/2017 1056   KETONESUR NEGATIVE 04/29/2018 1702   PROTEINUR 100 (A) 04/29/2018 1702   UROBILINOGEN negative 02/03/2016 1516   UROBILINOGEN 0.2 10/17/2015 1915   NITRITE NEGATIVE 04/29/2018 1702   LEUKOCYTESUR MODERATE (A) 04/29/2018 1702   LEUKOCYTESUR 2+ (A) 09/27/2017 1056   Sepsis Labs: @LABRCNTIP (procalcitonin:4,lacticidven:4)  ) Recent Results (from the past 240 hour(s))  SARS Coronavirus 2 (CEPHEID - Performed in [place] hospital lab), Hosp Order     Status: None   Collection Time: 06/28/19  5:58 PM   Specimen: Nasopharyngeal Swab  Result Value Ref Range Status   SARS Coronavirus 2 NEGATIVE NEGATIVE Final    Comment: (NOTE) If result is NEGATIVE SARS-CoV-2 target nucleic acids are NOT DETECTED. The SARS-CoV-2 RNA is generally detectable in upper and lower  respiratory specimens during the acute phase of infection. The lowest  concentration of SARS-CoV-2 viral copies this assay can detect is 250  copies / mL. A negative result does not preclude SARS-CoV-2 infection  and should not be used as the sole basis for  treatment or other  patient management decisions.  A negative result may occur with  improper specimen collection / handling, submission of specimen other  than nasopharyngeal swab, presence of viral mutation(s) within the  areas targeted by this assay, and inadequate number of viral copies  (<250 copies / mL). A negative result must be combined with clinical  observations, patient history, and epidemiological information. If result is POSITIVE SARS-CoV-2 target nucleic acids are DETECTED. The SARS-CoV-2 RNA is generally detectable in upper and lower  respiratory specimens dur ing the acute phase of infection.  Positive  results are indicative of active infection with SARS-CoV-2.  Clinical  correlation with patient history and other diagnostic information is  necessary to  determine patient infection status.  Positive results do  not rule out bacterial infection or co-infection with other viruses. If result is PRESUMPTIVE POSTIVE SARS-CoV-2 nucleic acids MAY BE PRESENT.   A presumptive positive result was obtained on the submitted specimen  and confirmed on repeat testing.  While 2019 novel coronavirus  (SARS-CoV-2) nucleic acids may be present in the submitted sample  additional confirmatory testing may be necessary for epidemiological  and / or clinical management purposes  to differentiate between  SARS-CoV-2 and other Sarbecovirus currently known to infect humans.  If clinically indicated additional testing with an alternate test  methodology [phone]) is advised. The SARS-CoV-2 RNA is generally  detectable in upper and lower respiratory sp ecimens during the acute  phase of infection. The expected result is Negative. Fact Sheet for Patients:  StrictlyIdeas.no Fact Sheet for Healthcare Providers: BankingDealers.co.za This test is not yet approved or cleared by the [place] FDA and has been authorized for detection and/or diagnosis of SARS-CoV-2 by FDA under an Emergency Use Authorization (EUA).  This EUA will remain in effect (meaning this test can be used) for the duration of the COVID-19 declaration under Section 564(b)(1) of the Act, 21 U.S.C. section 360bbb-3(b)(1), unless the authorization is terminated or revoked sooner. Performed at [facility], [address] [place], [place] [address]          Radiology Studies: Dg Chest 2 View  Result Date: 06/30/2019 CLINICAL DATA:  Chest pain. EXAM: CHEST - 2 VIEW COMPARISON:  Radiographs of June 28, 2019. FINDINGS: Stable cardiomegaly. Status post coronary bypass graft. Atherosclerosis of thoracic aorta is noted. No pneumothorax is noted. Left lung is clear. Minimal right basilar subsegmental atelectasis is noted with minimal right pleural  effusion. The visualized skeletal structures are unremarkable. IMPRESSION: Minimal right basilar subsegmental atelectasis with minimal right pleural effusion. Aortic Atherosclerosis (ICD10-I70.0). Electronically Signed   By: Marijo Conception M.D.   On: 06/30/2019 13:48   Nm Myocar Multi W/spect W/wall Motion / Ef  Result Date: 06/30/2019  There was no ST segment deviation noted during stress.  Defect 1: There is a small defect of mild severity.  This is a low risk study.  Nuclear stress EF: 57%.  No significant reversible ischemia. LVEF 57% with normal wall motion. This is a low risk study.        Scheduled Meds: . albuterol  2.5 mg Nebulization TID  . [START ON 07/02/2019] amLODipine  10 mg Oral Daily  . aspirin  81 mg Oral Daily  . atorvastatin  20 mg Oral QHS  . clopidogrel  75 mg Oral Daily  . hydrALAZINE  25 mg Oral TID  . insulin aspart  0-9 Units Subcutaneous TID WC  . insulin detemir  25 Units Subcutaneous q morning -  10a  . mometasone-formoterol  2 puff Inhalation BID  . pantoprazole  40 mg Oral Daily  . PARoxetine  20 mg Oral q morning - 10a   Continuous Infusions:    LOS: 2 days    Time spent: 67min    Domenic Polite, MD [facility] Page via [email or web address], password [email or web address] After 7PM please contact night-coverage  07/01/2019, 11:28 AM

## 2019-07-02 LAB — GLUCOSE, CAPILLARY
Glucose-Capillary: 112 mg/dL — ABNORMAL HIGH (ref 70–99)
Glucose-Capillary: 109 mg/dL — ABNORMAL HIGH (ref 70–99)
Glucose-Capillary: 201 mg/dL — ABNORMAL HIGH (ref 70–99)

## 2019-07-02 MED ORDER — FUROSEMIDE 20 MG PO TABS: 20.0000 mg | ORAL_TABLET | Freq: Every morning | ORAL | Status: DC

## 2019-07-02 NOTE — TOC Transition Note (Signed)
Transition of Care [facility]) - CM/SW Discharge Note   Patient Details  Name: [name] MRN: [id number] Date of Birth: 1931/08/05  Transition of Care [facility]) CM/SW Contact:  Claudie Leach, RN Phone Number: 07/02/2019, 10:56 AM   Clinical Narrative:    Pt to return home with daughter, family supports and [facility] PT/OT.  Daughter is with patient 24/7.  Patient has used [facility] in the past and would like to use them again.  Referral called to [facility] with [facility].  Start of care will be Wednesday or Thursday.  Daughter is satisfied with this plan.  There are no other home health agencies in the patient's area who will accept her insurance as of today.    Daughter states patient has a rolling walker but they would like to have platforms added for both arms as patient's back is curved and they feel she would be more comfortable.  Platforms ordered to be delivered to room prior to d/c.  Daughter advised that [facility] physical therapist can attach and adjust as necessary.      Final next level of care: [place] Barriers to Discharge: No Barriers Identified   Patient Goals and CMS Choice Patient states their goals for this hospitalization and ongoing recovery are:: to get home CMS Medicare.gov Compare Post Acute Care list provided to:: Patient Represenative (must comment)(daughter) Choice offered to / list presented to : Patient  Discharge Placement  Name of family member notified: [name]- daughter    Discharge Plan and Services          DME Arranged: Walker platform(right and left platforms to add to walker at home) DME Agency: [employer] Date DME Agency Contacted: 07/02/19 Time DME Agency Contacted: 84 Representative spoke with at DME Agency: [place]: PT, OT [place] Agency: [facility] (formerly [employer]) Date [place]: 07/02/19 Time [place]: [place] spoke with at [place]: Alwyn Ren

## 2019-07-02 NOTE — Progress Notes (Signed)
Progress Note  Patient Name: [name] Date of Encounter: 07/02/2019  Primary Cardiologist: Minus Breeding, MD   Subjective   Pt in bed  Appears comfortable  No CP  No SOB   Inpatient Medications    Scheduled Meds:  albuterol  2.5 mg Nebulization TID   amLODipine  10 mg Oral Daily   aspirin  81 mg Oral Daily   atorvastatin  20 mg Oral QHS   clopidogrel  75 mg Oral Daily   hydrALAZINE  25 mg Oral TID   insulin aspart  0-9 Units Subcutaneous TID WC   insulin detemir  25 Units Subcutaneous q morning - 10a   mometasone-formoterol  2 puff Inhalation BID   pantoprazole  40 mg Oral Daily   PARoxetine  20 mg Oral q morning - 10a   Continuous Infusions:  PRN Meds: albuterol, hydrALAZINE, nitroGLYCERIN, ondansetron **OR** ondansetron (ZOFRAN) IV, polyethylene glycol   Vital Signs    Vitals:   07/01/19 2022 07/01/19 2024 07/02/19 0009 07/02/19 0426  BP:   (!) 148/45 (!) 138/52  Pulse:   78 72  Resp:   20 19  Temp:   98.6 F (37 C) 98.3 F (36.8 C)  TempSrc:   Oral Oral  SpO2: 98% 98% 93% 95%  Weight:      Height:        Intake/Output Summary (Last 24 hours) at 07/02/2019 0810 Last data filed at 07/01/2019 1831 Gross per 24 hour  Intake 240 ml  Output 400 ml  Net -160 ml   Last 3 Weights 06/30/2019 06/28/2019 06/28/2019  Weight (lbs) 201 lb 155 lb 3.3 oz 155 lb  Weight (kg) 91.173 kg 70.4 kg 70.308 kg      Telemetry    SR   70s   - Personally Reviewed  ECG     None - Personally Reviewed  Physical Exam   GEN: No acute distress.   Neck: JVP is not elevated   Cardiac: RRR, Gr III/VI I systolic murmu base , rubs, or gallops.  Respiratory: Clear to auscultation bilaterally. GI: Soft, nontender, non-distended  MS: No edema; No deformity. Neuro:  Nonfocal  Psych:   Seems a little confused  Labs    High Sensitivity Troponin:   Recent Labs  Lab 06/28/19 1634 06/28/19 1809  TROPONINIHS 736* 657*      Cardiac EnzymesNo results for  input(s): TROPONINI in the last 168 hours. No results for input(s): TROPIPOC in the last 168 hours.   Chemistry Recent Labs  Lab 06/28/19 1637 06/29/19 0239 06/30/19 0418 07/01/19 0315  NA 136 140 140 135  K 5.3* 4.4 4.3 4.1  CL 107 109 111 107  CO2 19* 21* 19* 19*  GLUCOSE 245* 190* 183* 252*  BUN 54* 54* 41* 42*  CREATININE 1.98* 1.97* 1.47* 1.39*  CALCIUM 8.9 9.0 9.3 9.4  PROT 6.4*  --   --   --   ALBUMIN 3.0*  --   --   --   AST 15  --   --   --   ALT 11  --   --   --   ALKPHOS 54  --   --   --   BILITOT 0.6  --   --   --   GFRNONAA 22* 23* 32* 34*  GFRAA 26* 26* 37* 40*  ANIONGAP 10 10 10 9      Hematology Recent Labs  Lab 06/28/19 1637 06/30/19 0418 07/01/19 0315  WBC 7.9 12.7* 10.8*  RBC 3.13* 3.40* 3.21*  HGB 9.3* 10.1* 9.6*  HCT 28.6* 30.7* 28.8*  MCV 91.4 90.3 89.7  MCH 29.7 29.7 29.9  MCHC 32.5 32.9 33.3  RDW 13.2 13.3 13.4  PLT 218 213 210    BNP Recent Labs  Lab 06/28/19 1651  BNP 487.0*     DDimer No results for input(s): DDIMER in the last 168 hours.   Radiology    Dg Chest 2 View  Result Date: 06/30/2019 CLINICAL DATA:  Chest pain. EXAM: CHEST - 2 VIEW COMPARISON:  Radiographs of June 28, 2019. FINDINGS: Stable cardiomegaly. Status post coronary bypass graft. Atherosclerosis of thoracic aorta is noted. No pneumothorax is noted. Left lung is clear. Minimal right basilar subsegmental atelectasis is noted with minimal right pleural effusion. The visualized skeletal structures are unremarkable. IMPRESSION: Minimal right basilar subsegmental atelectasis with minimal right pleural effusion. Aortic Atherosclerosis (ICD10-I70.0). Electronically Signed   By: Marijo Conception M.D.   On: 06/30/2019 13:48   Nm Myocar Multi W/spect W/wall Motion / Ef  Result Date: 06/30/2019  There was no ST segment deviation noted during stress.  Defect 1: There is a small defect of mild severity.  This is a low risk study.  Nuclear stress EF: 57%.  No significant  reversible ischemia. LVEF 57% with normal wall motion. This is a low risk study.    Cardiac Studies   Echo 06/29/19  IMPRESSIONS    1. The left ventricle has normal systolic function with an ejection fraction of 60-65%. The cavity size was normal. There is moderate concentric left ventricular hypertrophy. Left ventricular diastolic Doppler parameters are consistent with  pseudonormalization. Elevated mean left atrial pressure.  2. The right ventricle has normal systolic function. The cavity was normal. There is no increase in right ventricular wall thickness. Right ventricular systolic pressure is mildly elevated with an estimated pressure of 36.6 mmHg.  3. Left atrial size was severely dilated.  4. The mitral valve is degenerative. Mild thickening of the mitral valve leaflet. Mild calcification of the mitral valve leaflet. There is severe mitral annular calcification present. Mitral valve regurgitation is mild to moderate by color flow Doppler.  Moderate mitral valve stenosis.  5. The aortic valve is tricuspid. Moderate thickening of the aortic valve. Moderate calcification of the aortic valve. Moderate stenosis of the aortic valve.  6. The aorta is normal in size and structure.  7. The inferior vena cava was dilated in size with >50% respiratory variability.  8. When compared to the prior study: 10/08/2018, there is very slight worsening of the aortic stenosis. Mitral valve gradients appear lower due to bradycardia.  FINDINGS  Left Ventricle: The left ventricle has normal systolic function, with an ejection fraction of 60-65%. The cavity size was normal. There is moderate concentric left ventricular hypertrophy. Left ventricular diastolic Doppler parameters are consistent  with pseudonormalization. Elevated mean left atrial pressure  Right Ventricle: The right ventricle has normal systolic function. The cavity was normal. There is no increase in right ventricular wall thickness. Right  ventricular systolic pressure is mildly elevated with an estimated pressure of 36.6 mmHg.  Left Atrium: Left atrial size was severely dilated.  Right Atrium: Right atrial size was normal in size. Right atrial pressure is estimated at 10 mmHg.  Interatrial Septum: No atrial level shunt detected by color flow Doppler.  Pericardium: There is no evidence of pericardial effusion.  Mitral Valve: The mitral valve is degenerative in appearance. Mild thickening of the mitral valve leaflet. Mild calcification  of the mitral valve leaflet. There is severe mitral annular calcification present. Mitral valve regurgitation is mild to  moderate by color flow Doppler. Moderate mitral valve stenosis. Gradients measured at 45 bpm.  Tricuspid Valve: The tricuspid valve is normal in structure. Tricuspid valve regurgitation is mild by color flow Doppler.  Aortic Valve: The aortic valve is tricuspid Moderate thickening of the aortic valve. Moderate calcification of the aortic valve. Aortic valve regurgitation was not visualized by color flow Doppler. There is Moderate stenosis of the aortic valve, with a  calculated valve area of 1.42 cm.  Pulmonic Valve: The pulmonic valve was normal in structure. Pulmonic valve regurgitation is trivial by color flow Doppler.  Aorta: The aorta is normal in size and structure.  Venous: The inferior vena cava measures 2.50 cm, is dilated in size with greater than 50% respiratory variability.  Compared to previous exam: 10/08/2018, there is very slight worsening of the aortic stenosis. Mitral valve gradients appear lower due to bradycardia.    +--------------+--------++  LEFT VENTRICLE            +----------------+---------++ +--------------+--------++  Diastology                    PLAX 2D                   +----------------+---------++ +--------------+--------++  LV e' lateral:   4.81 cm/s    LVIDd:         4.40 cm     +----------------+---------++ +--------------+--------++  LV E/e' lateral: 37.0         LVIDs:         3.00 cm    +----------------+---------++ +--------------+--------++  LV e' medial:    3.64 cm/s    LV PW:         1.40 cm    +----------------+---------++ +--------------+--------++  LV E/e' medial:  48.9         LV IVS:        1.50 cm    +----------------+---------++ +--------------+--------++  LVOT diam:     2.20 cm    +--------------+--------++  LV SV:         53 ml      +--------------+--------++  LV SV Index:   28.93      +--------------+--------++  LVOT Area:     3.80 cm   +--------------+--------++                            +--------------+--------++  +---------------+----------++  RIGHT VENTRICLE              +---------------+----------++  RV S prime:     10.40 cm/s   +---------------+----------++  TAPSE (M-mode): 1.3 cm       +---------------+----------++  RVSP:           34.6 mmHg    +---------------+----------++  +---------------+--------++-----------++  LEFT ATRIUM               Index         +---------------+--------++-----------++  LA diam:        5.20 cm   2.90 cm/m    +---------------+--------++-----------++  LA Vol (A2C):   148.0 ml  82.43 ml/m   +---------------+--------++-----------++  LA Vol (A4C):   111.0 ml  61.82 ml/m   +---------------+--------++-----------++  LA Biplane Vol: 128.0 ml  71.29 ml/m   +---------------+--------++-----------++ +------------+---------++-----------++  RIGHT ATRIUM  Index         +------------+---------++-----------++  RA Pressure: 8.00 mmHg                +------------+---------++-----------++  RA Area:     12.50 cm                +------------+---------++-----------++  RA Volume:   26.30 ml   14.65 ml/m   +------------+---------++-----------++  +------------------+------------++  AORTIC VALVE                      +------------------+------------++  AV Area (Vmax):    1.34 cm        +------------------+------------++  AV Area (Vmean):   1.46 cm       +------------------+------------++  AV Area (VTI):     1.42 cm       +------------------+------------++  AV Vmax:           322.50 cm/s    +------------------+------------++  AV Vmean:          219.000 cm/s   +------------------+------------++  AV VTI:            0.934 m        +------------------+------------++  AV Peak Grad:      41.6 mmHg      +------------------+------------++  AV Mean Grad:      22.0 mmHg      +------------------+------------++  LVOT Vmax:         114.00 cm/s    +------------------+------------++  LVOT Vmean:        84.300 cm/s    +------------------+------------++  LVOT VTI:          0.348 m        +------------------+------------++  LVOT/AV VTI ratio: 0.37           +------------------+------------++   +-------------+-------++  AORTA                   +-------------+-------++  Ao Root diam: 3.00 cm   +-------------+-------++  +--------------+-----------++ +---------------+-----------++  MITRAL VALVE                  TRICUSPID VALVE               +--------------+-----------++ +---------------+-----------++  MV Area (PHT): 1.26 cm       TR Peak grad:   26.6 mmHg     +--------------+-----------++ +---------------+-----------++  MV Peak grad:  21.3 mmHg      TR Vmax:        293.00 cm/s   +--------------+-----------++ +---------------+-----------++  MV Mean grad:  5.5 mmHg       Estimated RAP:  8.00 mmHg     +--------------+-----------++ +---------------+-----------++  MV Vmax:       2.31 m/s       RVSP:           34.6 mmHg     +--------------+-----------++ +---------------+-----------++  MV Vmean:      99.9 cm/s     +--------------+-----------++ +--------------+-------+  MV VTI:        0.95 m         SHUNTS                  +--------------+-----------++ +--------------+-------+  MV PHT:        174.58 msec    Systemic VTI:  0.35 m   +--------------+-----------++  +--------------+-------+  MV Decel Time: 602 msec       Systemic Diam: 2.20 cm  +--------------+-----------++ +--------------+-------+ +--------------+-----------++  MV  E velocity: 178.00 cm/s   +--------------+-----------++  MV A velocity: 108.00 cm/s   +--------------+-----------++  MV E/A ratio:  1.65          +--------------+-----------++  +---------+-------+  IVC                +---------+-------+  IVC diam: 2.50 cm  +---------+-------+    Sanda Klein MD Electronically signed by Sanda Klein MD Signature Date/Time: 06/29/2019/1:31:11 PM      MYOVUE 7/23  There was no ST segment deviation noted during stress.  Defect 1: There is a small defect of mild severity.  This is a low risk study.  Nuclear stress EF: 57%.   No significant reversible ischemia. LVEF 57% with normal wall motion. This is a low risk study  Patient Profile     85 y.o. female with a hx of CAD s/p CABG (2006), carotic artery stenosis, HTN with hx of hypertensive urgency, CKD stage III, DM, dyslipidemia, GERD, and chronic diastolic heart failure who is being seen today for the evaluation of bradycardia at the request of Dr. Broadus John.   Assessment & Plan    1  Bradycardia   Resolved off of b blocker   WOuld not resume     2  Aortic stenosis  Echo with moderate AS     3 Dizziness   Orthostatics were neg 2 days ago    4   HTN   Antihypertensives increased yesterday    BP is OK   GOal for BP should not be too aggressive given age, AS    WIll follow as outpt    5 CAD  Chest pressure  Myovue is normal      6 Chronic diastolic CHF    Volume is OK        Pt being d/c'd today  WIll make sure has f/u  appt   For questions or updates, please contact [facility] Please consult [email or web address] for contact info under        Signed, Dorris Carnes, MD  07/02/2019, 8:10 AM

## 2019-07-02 NOTE — Discharge Summary (Signed)
Physician Discharge Summary  [name] TIR:[id number] DOB: September 03, 1931 DOA: 06/28/2019  PCP: [name], MD  Admit date: 06/28/2019 Discharge date: 07/02/2019  Time spent: 35 minutes  Recommendations for Outpatient Follow-up:  1. PCP in 1 week, consider goals of care discussions and palliative evaluation 2. Cardiology Dr. Percival Spanish in 1 month 3. Home health PT, OT   Discharge Diagnoses:  Principal Problem: Symptomatic bradycardia Moderate aortic stenosis Elevated troponin Chronic diastolic CHF Generalized weakness AKI on CKD stage III Hypertension Type 2 diabetes mellitus CAD History of CVA with dysarthria Mild dementia Depression  Discharge Condition: Stable  Diet recommendation: Heart healthy, diabetic  Filed Weights   06/28/19 1814 06/28/19 2153 06/30/19 0500  Weight: 70.3 kg 70.4 kg 91.2 kg    History of present illness:  85 year old female with history of CAD status post CABG in 2006, CVA, vascular dementia, carotid artery stenosis, hypertension, stage III kidney disease, type 2 diabetes, dyslipidemia, chronic diastolic CHF presented to the ED with worsening weakness, recent multiple falls, upon arrival to the ED she was noted to be bradycardic with heart rate in the 30s to mid 40s with junctional rhythm, new RBBB, mildly elevated troponin subsequently transferred to [facility] for cardiology evaluation  Hospital Course:   Symptomatic bradycardia -EKG noted junctional rhythm initially, followed by sinus bradycardia -Amlodipine and Lopressor at baseline these were held on admission, heart rate improving, amlodipine was restarted, cardiology following -Bradycardia has resolved, currently remains in the sinus rhythm -Followed by cardiology this admission, recommended to discontinue beta-blocker -2D echocardiogram notes moderate AS slightly worse from prior, severely dilated left atrium and at least moderate LVH,  -TSh within normal limits -Physical  therapy evaluation completed, home PT recommended and set up at discharge  Moderate aortic stenosis -Echocardiogram noted moderate aortic stenosis, slight progression from prior with severely dilated left atrium and moderate LVH  -Followed by cardiology this admission, see discussion above, follow-up with Dr. Percival Spanish in 1 month, depending on symptoms and progression may need evaluation for TAVR if clinical status worsens  Elevated troponin -Likely demand related, AS could be contributing -Initial EKG with some ST depressions which improved -Continue aspirin Plavix, holding beta-blocker -Cardiology following, underwent Myoview  7/24 which was read as low risk  Chronic diastolic CHF -Remains euvolemic is on Lasix 40 mg at baseline restarted at a lower dose of 20 mg daily, Aldactone discontinued  AKI on Stage III- chronic kidney disease -Creatinine higher than baseline, on admission diuretics were held and she was given gentle hydration -Creatinine improved to 1.4 at discharge, Lasix resumed at a lower dose, Aldactone discontinued  Hypertension -BP trending up, increased hydralazine and amlodipine, stopped beta-blocker  Diabetes mellitus -Continued on Levemir and sliding scale insulin  CAD history -History of coronary angioplasty with 5stentsprior to 2005,CABG 2006,follows with Dr. Percival Spanish.  -Resume aspirin Plavix -Hold metoprolol for bradycardia, Myoview this admission was low risk  CVA,Dementia and depression-patient mostly independent of her ADLs.  -ContinuehomeLipitor, paroxetine  Chronic iron deficiency anemia -Continue oral iron, stable   Discharge Exam: Vitals:   07/02/19 1240 07/02/19 1355  BP: (!) 167/61   Pulse:    Resp: 18   Temp: 98.3 F (36.8 C)   SpO2: 95% 97%    General: AAOx2, dysarthric Cardiovascular: Q7Y1/PJK, systolic murmur Respiratory: CTAB  Discharge Instructions   Discharge Instructions    Diet - low sodium heart  healthy   Complete by: As directed    Diet Carb Modified   Complete by: As directed  Increase activity slowly   Complete by: As directed      Allergies as of 07/02/2019      Reactions   Advil [ibuprofen] Swelling   Heparin Other (See Comments)   Confusion, hallucinations.    Propoxyphene N-acetaminophen Other (See Comments)   Numbness all over. "floating" sensation.      Medication List    STOP taking these medications   isosorbide mononitrate 30 MG 24 hr tablet Commonly known as: IMDUR   methenamine 1 g tablet Commonly known as: HIPREX   metoprolol tartrate 50 MG tablet Commonly known as: LOPRESSOR   spironolactone 25 MG tablet Commonly known as: ALDACTONE     TAKE these medications   albuterol 108 (90 Base) MCG/ACT inhaler Commonly known as: VENTOLIN HFA Inhale 2 puffs into the lungs every 6 (six) hours as needed for wheezing or shortness of breath.   amLODipine 10 MG tablet Commonly known as: NORVASC TAKE 1 TABLET DAILY.   aspirin 81 MG chewable tablet Chew 81 mg by mouth at bedtime.   atorvastatin 20 MG tablet Commonly known as: LIPITOR Take 1 tablet (20 mg total) by mouth at bedtime.   clopidogrel 75 MG tablet Commonly known as: PLAVIX TAKE 1 TABLET DAILY.   DAILY VITAMINS/IRON/BETA CAROT PO Take 1 tablet by mouth at bedtime.   esomeprazole 40 MG capsule Commonly known as: NEXIUM TAKE 1 CAPSULE BY MOUTH ONCE DAILY. What changed: when to take this   ferrous sulfate 324 (65 Fe) MG Tbec Take 324 mg by mouth daily.   furosemide 20 MG tablet Commonly known as: LASIX Take 1 tablet (20 mg total) by mouth every morning. What changed: how much to take   hydrALAZINE 50 MG tablet Commonly known as: APRESOLINE TAKE 1 TABLET BY MOUTH 3 TIMES DAILY.   insulin lispro 100 UNIT/ML KiwkPen Commonly known as: HumaLOG KwikPen Inject 0.04-0.1 mLs (4-10 Units total) into the skin daily as needed (high blood sugar). What changed:   how much to  take  reasons to take this   Levemir FlexTouch 100 UNIT/ML Pen Generic drug: Insulin Detemir INJECT 24 UNITS EACH MORNING What changed: See the new instructions.   metFORMIN 500 MG 24 hr tablet Commonly known as: GLUCOPHAGE-XR TAKE 1 TABLET 2 TIMES A DAY WITH BREAKFAST & DINNER. What changed: See the new instructions.   nabumetone 500 MG tablet Commonly known as: RELAFEN Take 1 tablet (500 mg total) by mouth 2 (two) times daily. For muscle and joint pain What changed:   when to take this  reasons to take this   nitroGLYCERIN 0.4 MG SL tablet Commonly known as: NITROSTAT PLACE (1) TABLET UNDER TONGUE EVERY 5 MINUTES UP TO (3) DOSES. IF NO RELIEF CALL 911. What changed: See the new instructions.   omeprazole 20 MG capsule Commonly known as: PRILOSEC 1 PO 30 MINS PRIOR TO BREAKFAST. What changed:   how much to take  how to take this  when to take this  additional instructions   PARoxetine 20 MG tablet Commonly known as: PAXIL TAKE 1 TABLET BY MOUTH EVERY MORNING. What changed: when to take this   polyethylene glycol 17 g packet Commonly known as: MiraLax Take 17 g by mouth daily. What changed:   when to take this  reasons to take this            Durable Medical Equipment  (From admission, onward)         Start     Ordered   07/02/19 1047  For home use only DME Walker platform  Once    Comments: Right and left platform only.  Will be attached to existing rolling walker at home.  Question:  Patient needs a walker to treat with the following condition  Answer:  Weakness   07/02/19 1052         Allergies  Allergen Reactions  . Advil [Ibuprofen] Swelling  . Heparin Other (See Comments)    Confusion, hallucinations.   . Propoxyphene N-Acetaminophen Other (See Comments)    Numbness all over. "floating" sensation.   Follow-up Information    Stacks, Cletus Gash, MD. Schedule an appointment as soon as possible for a visit in 1 week(s).   Specialty:  Family Medicine Contact information: [place] [address] [phone]        Minus Breeding, MD. Call in 2 week(s).   Specialty: Cardiology Contact information: [place] [address] [phone]        [facility] Follow up.   Specialty: Home Health Services Why: Physical and occupational therapist will contact you for visits starting Wednesday or Thursday.  Contact information: [address] [place] [address] [phone]            The results of significant diagnostics from this hospitalization (including imaging, microbiology, ancillary and laboratory) are listed below for reference.    Significant Diagnostic Studies: Dg Chest 2 View  Result Date: 06/30/2019 CLINICAL DATA:  Chest pain. EXAM: CHEST - 2 VIEW COMPARISON:  Radiographs of June 28, 2019. FINDINGS: Stable cardiomegaly. Status post coronary bypass graft. Atherosclerosis of thoracic aorta is noted. No pneumothorax is noted. Left lung is clear. Minimal right basilar subsegmental atelectasis is noted with minimal right pleural effusion. The visualized skeletal structures are unremarkable. IMPRESSION: Minimal right basilar subsegmental atelectasis with minimal right pleural effusion. Aortic Atherosclerosis (ICD10-I70.0). Electronically Signed   By: Marijo Conception M.D.   On: 06/30/2019 13:48   Dg Chest 2 View  Result Date: 06/28/2019 CLINICAL DATA:  Weakness.  Left lower rib pain.  Fall last week. EXAM: CHEST - 2 VIEW COMPARISON:  Chest x-ray dated January 20, 2019. FINDINGS: Stable cardiomediastinal silhouette status post CABG. Atherosclerotic calcification of the aortic arch. Normal pulmonary vascularity. No focal consolidation, pleural effusion, or pneumothorax. No acute osseous abnormality. Chronic severe L1 compression deformity. IMPRESSION: No active cardiopulmonary disease. Electronically Signed   By: Titus Dubin M.D.   On: 06/28/2019 17:30   Ct Head Wo  Contrast  Result Date: 06/29/2019 CLINICAL DATA:  85 year old female with multiple falls. EXAM: CT HEAD WITHOUT CONTRAST TECHNIQUE: Contiguous axial images were obtained from the base of the skull through the vertex without intravenous contrast. COMPARISON:  Head CT dated 01/31/2019 FINDINGS: Brain: There is moderate age-related atrophy and chronic microvascular ischemic changes. 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 There is no acute intracranial hemorrhage. No mass effect or midline shift. No extra-axial fluid collection. Vascular: No hyperdense vessel or unexpected calcification. Skull: Normal. Negative for fracture or focal lesion. Sinuses/Orbits: Diffuse mucoperiosteal thickening of paranasal sinuses. No air-fluid level. The mastoid air cells are clear. Other: None IMPRESSION: 1. No acute intracranial hemorrhage. 2. Age-related atrophy and chronic microvascular ischemic changes. 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 Electronically Signed   By: Anner Crete M.D.   On: 06/29/2019 01:24   Nm Myocar Multi W/spect W/wall Motion / Ef  Result Date: 06/30/2019  There was no ST segment deviation noted during  stress.  Defect 1: There is a small defect of mild severity.  This is a low risk study.  Nuclear stress EF: 57%.  No significant reversible ischemia. LVEF 57% with normal wall motion. This is a low risk study.   Dg Hips Bilat W Or Wo Pelvis 2 Views  Result Date: 06/28/2019 CLINICAL DATA:  Fall 1 week ago with persistent left-sided hip pain, initial encounter EXAM: DG HIP (WITH OR WITHOUT PELVIS) 4V BILAT COMPARISON:  10/26/2016 FINDINGS: The pelvic ring is intact. Diffuse vascular calcifications are seen. No acute fracture or dislocation is noted. No soft tissue abnormality is seen. IMPRESSION: No acute abnormality noted. Electronically Signed   By: Inez Catalina M.D.   On: 06/28/2019 18:03    Microbiology: Recent Results (from  the past 240 hour(s))  SARS Coronavirus 2 (CEPHEID - Performed in [place] hospital lab), Hosp Order     Status: None   Collection Time: 06/28/19  5:58 PM   Specimen: Nasopharyngeal Swab  Result Value Ref Range Status   SARS Coronavirus 2 NEGATIVE NEGATIVE Final    Comment: (NOTE) If result is NEGATIVE SARS-CoV-2 target nucleic acids are NOT DETECTED. The SARS-CoV-2 RNA is generally detectable in upper and lower  respiratory specimens during the acute phase of infection. The lowest  concentration of SARS-CoV-2 viral copies this assay can detect is 250  copies / mL. A negative result does not preclude SARS-CoV-2 infection  and should not be used as the sole basis for treatment or other  patient management decisions.  A negative result may occur with  improper specimen collection / handling, submission of specimen other  than nasopharyngeal swab, presence of viral mutation(s) within the  areas targeted by this assay, and inadequate number of viral copies  (<250 copies / mL). A negative result must be combined with clinical  observations, patient history, and epidemiological information. If result is POSITIVE SARS-CoV-2 target nucleic acids are DETECTED. The SARS-CoV-2 RNA is generally detectable in upper and lower  respiratory specimens dur ing the acute phase of infection.  Positive  results are indicative of active infection with SARS-CoV-2.  Clinical  correlation with patient history and other diagnostic information is  necessary to determine patient infection status.  Positive results do  not rule out bacterial infection or co-infection with other viruses. If result is PRESUMPTIVE POSTIVE SARS-CoV-2 nucleic acids MAY BE PRESENT.   A presumptive positive result was obtained on the submitted specimen  and confirmed on repeat testing.  While 2019 novel coronavirus  (SARS-CoV-2) nucleic acids may be present in the submitted sample  additional confirmatory testing may be necessary  for epidemiological  and / or clinical management purposes  to differentiate between  SARS-CoV-2 and other Sarbecovirus currently known to infect humans.  If clinically indicated additional testing with an alternate test  methodology [phone]) is advised. The SARS-CoV-2 RNA is generally  detectable in upper and lower respiratory sp ecimens during the acute  phase of infection. The expected result is Negative. Fact Sheet for Patients:  StrictlyIdeas.no Fact Sheet for Healthcare Providers: BankingDealers.co.za This test is not yet approved or cleared by the [place] FDA and has been authorized for detection and/or diagnosis of SARS-CoV-2 by FDA under an Emergency Use Authorization (EUA).  This EUA will remain in effect (meaning this test can be used) for the duration of the COVID-19 declaration under Section 564(b)(1) of the Act, 21 U.S.C. section 360bbb-3(b)(1), unless the authorization is terminated or revoked sooner. Performed at [facility],  [address] [place],  [address]      Labs: Basic Metabolic Panel: Recent Labs  Lab 06/28/19 1634 06/28/19 1637 06/29/19 0239 06/30/19 0418 07/01/19 0315  NA  --  136 140 140 135  K  --  5.3* 4.4 4.3 4.1  CL  --  107 109 111 107  CO2  --  19* 21* 19* 19*  GLUCOSE  --  245* 190* 183* 252*  BUN  --  54* 54* 41* 42*  CREATININE  --  1.98* 1.97* 1.47* 1.39*  CALCIUM  --  8.9 9.0 9.3 9.4  MG 1.8  --   --   --   --    Liver Function Tests: Recent Labs  Lab 06/28/19 1637  AST 15  ALT 11  ALKPHOS 54  BILITOT 0.6  PROT 6.4*  ALBUMIN 3.0*   No results for input(s): LIPASE, AMYLASE in the last 168 hours. No results for input(s): AMMONIA in the last 168 hours. CBC: Recent Labs  Lab 06/28/19 1637 06/30/19 0418 07/01/19 0315  WBC 7.9 12.7* 10.8*  HGB 9.3* 10.1* 9.6*  HCT 28.6* 30.7* 28.8*  MCV 91.4 90.3 89.7  PLT 218 213 210   Cardiac Enzymes: No results for  input(s): CKTOTAL, CKMB, CKMBINDEX, TROPONINI in the last 168 hours. BNP: BNP (last 3 results) Recent Labs    01/01/19 0627 01/03/19 1713 06/28/19 1651  BNP 365.0* 184.8* 487.0*    ProBNP (last 3 results) No results for input(s): PROBNP in the last 8760 hours.  CBG: Recent Labs  Lab 07/01/19 1603 07/01/19 2121 07/02/19 0610 07/02/19 0857 07/02/19 1239  GLUCAP 178* 196* 112* 109* 201*       Signed:  Domenic Polite MD.  [facility] 07/02/2019, 2:44 PM

## 2019-07-02 NOTE — Progress Notes (Signed)
Patient is ready for discharge. She has all of her belongings with her. I have reviewed all of her discharge paperwork with her and she states that she understand all that has been explained regarding her discharge orders. IV has been removed and patient has been taken off telemetry. I have spoken to her daughter and she will pick patient up at the front entrance of the hospital and transport her home.

## 2019-07-03 ENCOUNTER — Telehealth: Payer: Self-pay | Admitting: Family Medicine

## 2019-07-03 ENCOUNTER — Encounter: Payer: Self-pay | Admitting: Physician Assistant

## 2019-07-03 ENCOUNTER — Other Ambulatory Visit: Payer: Self-pay

## 2019-07-03 ENCOUNTER — Ambulatory Visit (INDEPENDENT_AMBULATORY_CARE_PROVIDER_SITE_OTHER): Payer: Medicare Other | Admitting: Physician Assistant

## 2019-07-03 VITALS — BP 141/68 | HR 74 | Temp 99.1°F | Ht 66.0 in

## 2019-07-03 DIAGNOSIS — N3944 Nocturnal enuresis: Secondary | ICD-10-CM | POA: Diagnosis not present

## 2019-07-03 DIAGNOSIS — N3946 Mixed incontinence: Secondary | ICD-10-CM | POA: Diagnosis not present

## 2019-07-03 DIAGNOSIS — R001 Bradycardia, unspecified: Secondary | ICD-10-CM | POA: Diagnosis not present

## 2019-07-03 DIAGNOSIS — I509 Heart failure, unspecified: Secondary | ICD-10-CM

## 2019-07-03 NOTE — Telephone Encounter (Signed)
Patient has a follow up appointment scheduled. 

## 2019-07-05 ENCOUNTER — Other Ambulatory Visit: Payer: Self-pay | Admitting: Family Medicine

## 2019-07-05 DIAGNOSIS — Z794 Long term (current) use of insulin: Secondary | ICD-10-CM

## 2019-07-05 DIAGNOSIS — F411 Generalized anxiety disorder: Secondary | ICD-10-CM

## 2019-07-05 DIAGNOSIS — K219 Gastro-esophageal reflux disease without esophagitis: Secondary | ICD-10-CM

## 2019-07-05 DIAGNOSIS — E119 Type 2 diabetes mellitus without complications: Secondary | ICD-10-CM

## 2019-07-06 ENCOUNTER — Encounter: Payer: Self-pay | Admitting: Physician Assistant

## 2019-07-06 NOTE — Progress Notes (Signed)
BP (!) 141/68   Pulse 74   Temp 99.1 F (37.3 C) (Oral)   Ht 5\' 6"  (1.676 m)   BMI 32.44 kg/m    Subjective:    [name], female    DOB: 27-Apr-1931, 85 y.o.   MRN: [id number]  HPI: [name] is a 85 y.o. female presenting on 07/03/2019 for Hospitalization Follow-up   Recommendations for Outpatient Follow-up:  1. PCP in 1 week, consider goals of care discussions and palli 2.  3. ative evaluation 4. Cardiology Dr. Percival Spanish in 1 month 5. Home health PT, OT   Discharge Diagnoses:  Principal Problem: Symptomatic bradycardia Moderate aortic stenosis Elevated troponin Chronic diastolic CHF Generalized weakness AKI on CKD stage III Hypertension Type 2 diabetes mellitus CAD History of CVA with dysarthria Mild dementia Depression  This patient comes in for a hospital follow-up.  She is accompanied by her daughter.  She is wheelchair-bound.  She had been diagnosed with a bradycardia and dyspnea.  The discharge diagnoses are listed above.  She does feel like she is doing some better.  She does have a follow-up with her cardiologist, Dr. Percival Spanish.  There is home health already in place  The patient has had long-lasting incontinence.  They have to keep her in depends and pads on the bed.  However there still is some leakage.  The daughter was asking about a device that is being used for collecting of the urine.  I am not exactly sure what she means, I think she may have seen an external catheter in place.  If there is something available they would like it sent to [facility]   Past Medical History:  Diagnosis Date  . Anemia   . Anxiety   . Aortic stenosis   . Arthritis    back, knees, and hips  . Asthma    with allergies  . Balance problems   . CAD (coronary artery disease)   . Carotid stenosis   . CHF (congestive heart failure) ([place])    EF preserved Echo 2012  . CKD (chronic kidney disease)   . 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 Dementia ([place])    "a little"  . Depression   . Diabetes mellitus    type 2  . GERD (gastroesophageal reflux disease)   . Hypertension   . Iron deficiency anemia 05/27/2011  . Myocardial infarction ([place])   . Renal insufficiency   . Scoliosis   . Vertigo    "when sugar gets low"  . Vision loss    left eye-"half blind"   Relevant past medical, surgical, family and social history reviewed and updated as indicated. Interim medical history since our last visit reviewed. Allergies and medications reviewed and updated. DATA REVIEWED: CHART IN EPIC  Family History reviewed for pertinent findings.  Review of Systems  Constitutional: Positive for fatigue. Negative for fever and unexpected weight change.  HENT: Negative.   Eyes: Negative.   Respiratory: Negative.  Negative for cough, chest tightness, shortness of breath and wheezing.   Cardiovascular: Negative.   Gastrointestinal: Negative.   Genitourinary: Positive for enuresis.    Allergies as of 07/03/2019      Reactions   Advil [ibuprofen] Swelling   Heparin Other (See Comments)   Confusion, hallucinations.    Propoxyphene N-acetaminophen Other (See Comments)   Numbness all over. "floating" sensation.  Medication List       Accurate as of July 03, 2019 11:59 PM. If you have any questions, ask your nurse or doctor.        albuterol 108 (90 Base) MCG/ACT inhaler Commonly known as: VENTOLIN HFA Inhale 2 puffs into the lungs every 6 (six) hours as needed for wheezing or shortness of breath.   amLODipine 10 MG tablet Commonly known as: NORVASC TAKE 1 TABLET DAILY.   aspirin 81 MG chewable tablet Chew 81 mg by mouth at bedtime.   atorvastatin 20 MG tablet Commonly known as: LIPITOR Take 1 tablet (20 mg total) by mouth at bedtime.   clopidogrel 75 MG tablet Commonly known as: PLAVIX TAKE 1 TABLET DAILY.   DAILY  VITAMINS/IRON/BETA CAROT PO Take 1 tablet by mouth at bedtime.   esomeprazole 40 MG capsule Commonly known as: NEXIUM TAKE 1 CAPSULE BY MOUTH ONCE DAILY. What changed: when to take this   ferrous sulfate 324 (65 Fe) MG Tbec Take 324 mg by mouth daily.   furosemide 20 MG tablet Commonly known as: LASIX Take 1 tablet (20 mg total) by mouth every morning.   hydrALAZINE 50 MG tablet Commonly known as: APRESOLINE TAKE 1 TABLET BY MOUTH 3 TIMES DAILY.   insulin lispro 100 UNIT/ML KiwkPen Commonly known as: HumaLOG KwikPen Inject 0.04-0.1 mLs (4-10 Units total) into the skin daily as needed (high blood sugar). What changed:   how much to take  reasons to take this   Levemir FlexTouch 100 UNIT/ML Pen Generic drug: Insulin Detemir INJECT 24 UNITS EACH MORNING What changed: See the new instructions.   metFORMIN 500 MG 24 hr tablet Commonly known as: GLUCOPHAGE-XR TAKE 1 TABLET 2 TIMES A DAY WITH BREAKFAST & DINNER. What changed: See the new instructions.   nabumetone 500 MG tablet Commonly known as: RELAFEN Take 1 tablet (500 mg total) by mouth 2 (two) times daily. For muscle and joint pain What changed:   when to take this  reasons to take this   nitroGLYCERIN 0.4 MG SL tablet Commonly known as: NITROSTAT PLACE (1) TABLET UNDER TONGUE EVERY 5 MINUTES UP TO (3) DOSES. IF NO RELIEF CALL 911. What changed: See the new instructions.   omeprazole 20 MG capsule Commonly known as: PRILOSEC 1 PO 30 MINS PRIOR TO BREAKFAST. What changed:   how much to take  how to take this  when to take this  additional instructions   PARoxetine 20 MG tablet Commonly known as: PAXIL TAKE 1 TABLET BY MOUTH EVERY MORNING. What changed: when to take this   polyethylene glycol 17 g packet Commonly known as: MiraLax Take 17 g by mouth daily. What changed:   when to take this  reasons to take this          Objective:    BP (!) 141/68   Pulse 74   Temp 99.1 F (37.3 C)  (Oral)   Ht 5\' 6"  (1.676 m)   BMI 32.44 kg/m   Allergies  Allergen Reactions  . Advil [Ibuprofen] Swelling  . Heparin Other (See Comments)    Confusion, hallucinations.   . Propoxyphene N-Acetaminophen Other (See Comments)    Numbness all over. "floating" sensation.    Wt Readings from Last 3 Encounters:  06/30/19 201 lb (91.2 kg)  02/15/19 148 lb (67.1 kg)  02/08/19 151 lb 6.4 oz (68.7 kg)    Physical Exam Constitutional:      Appearance: She is well-developed.  HENT:     Head:  Normocephalic and atraumatic.  Eyes:     Conjunctiva/sclera: Conjunctivae normal.     Pupils: Pupils are equal, round, and reactive to light.  Cardiovascular:     Rate and Rhythm: Normal rate and regular rhythm.     Heart sounds: Normal heart sounds.  Pulmonary:     Effort: Pulmonary effort is normal.     Breath sounds: Normal breath sounds.  Skin:    General: Skin is warm and dry.     Findings: No rash.  Neurological:     Mental Status: She is alert and oriented to person, place, and time.     Deep Tendon Reflexes: Reflexes are normal and symmetric.  Psychiatric:        Behavior: Behavior normal.        Thought Content: Thought content normal.        Judgment: Judgment normal.     Results for orders placed or performed during the hospital encounter of 06/28/19  SARS Coronavirus 2 (CEPHEID - Performed in [place] hospital lab), [facility] Order   Specimen: Nasopharyngeal Swab  Result Value Ref Range   SARS Coronavirus 2 NEGATIVE NEGATIVE  NM Myocar Multi W/Spect W/Wall Motion / EF  Result Value Ref Range   Rest HR 68 bpm   Rest BP 154/59 mmHg   Percent HR 67 %   Exercise duration (min) 5 min   Estimated workload 1.0 METS   Peak HR 91 bpm   Peak BP 158/79 mmHg   MPHR 135 bpm  Comprehensive metabolic panel  Result Value Ref Range   Sodium 136 135 - 145 mmol/L   Potassium 5.3 (H) 3.5 - 5.1 mmol/L   Chloride 107 98 - 111 mmol/L   CO2 19 (L) 22 - 32 mmol/L   Glucose, Bld 245 (H) 70 -  99 mg/dL   BUN 54 (H) 8 - 23 mg/dL   Creatinine, Ser 1.98 (H) 0.44 - 1.00 mg/dL   Calcium 8.9 8.9 - 10.3 mg/dL   Total Protein 6.4 (L) 6.5 - 8.1 g/dL   Albumin 3.0 (L) 3.5 - 5.0 g/dL   AST 15 15 - 41 U/L   ALT 11 0 - 44 U/L   Alkaline Phosphatase 54 38 - 126 U/L   Total Bilirubin 0.6 0.3 - 1.2 mg/dL   GFR calc non Af Amer 22 (L) >60 mL/min   GFR calc Af Amer 26 (L) >60 mL/min   Anion gap 10 5 - 15  CBC  Result Value Ref Range   WBC 7.9 4.0 - 10.5 K/uL   RBC 3.13 (L) 3.87 - 5.11 MIL/uL   Hemoglobin 9.3 (L) 12.0 - 15.0 g/dL   HCT 28.6 (L) 36.0 - 46.0 %   MCV 91.4 80.0 - 100.0 fL   MCH 29.7 26.0 - 34.0 pg   MCHC 32.5 30.0 - 36.0 g/dL   RDW 13.2 11.5 - 15.5 %   Platelets 218 150 - 400 K/uL   nRBC 0.0 0.0 - 0.2 %  Brain natriuretic peptide  Result Value Ref Range   B Natriuretic Peptide 487.0 (H) 0.0 - 100.0 pg/mL  TSH  Result Value Ref Range   TSH 2.102 0.350 - 4.500 uIU/mL  Magnesium  Result Value Ref Range   Magnesium 1.8 1.7 - 2.4 mg/dL  Protime-INR  Result Value Ref Range   Prothrombin Time 13.6 11.4 - 15.2 seconds   INR 1.1 0.8 - 1.2  APTT  Result Value Ref Range   aPTT 30 24 - 36 seconds  Basic metabolic panel  Result Value Ref Range   Sodium 140 135 - 145 mmol/L   Potassium 4.4 3.5 - 5.1 mmol/L   Chloride 109 98 - 111 mmol/L   CO2 21 (L) 22 - 32 mmol/L   Glucose, Bld 190 (H) 70 - 99 mg/dL   BUN 54 (H) 8 - 23 mg/dL   Creatinine, Ser 1.97 (H) 0.44 - 1.00 mg/dL   Calcium 9.0 8.9 - 10.3 mg/dL   GFR calc non Af Amer 23 (L) >60 mL/min   GFR calc Af Amer 26 (L) >60 mL/min   Anion gap 10 5 - 15  Ferritin  Result Value Ref Range   Ferritin 49 11 - 307 ng/mL  Iron and TIBC  Result Value Ref Range   Iron 31 28 - 170 ug/dL   TIBC 262 250 - 450 ug/dL   Saturation Ratios 12 10.4 - 31.8 %   UIBC 231 ug/dL  Hemoglobin A1c  Result Value Ref Range   Hgb A1c MFr Bld 8.5 (H) 4.8 - 5.6 %   Mean Plasma Glucose 197.25 mg/dL  Heparin level (unfractionated)  Result Value  Ref Range   Heparin Unfractionated 0.16 (L) 0.30 - 0.70 IU/mL  Glucose, capillary  Result Value Ref Range   Glucose-Capillary 155 (H) 70 - 99 mg/dL  Glucose, capillary  Result Value Ref Range   Glucose-Capillary 126 (H) 70 - 99 mg/dL  Glucose, capillary  Result Value Ref Range   Glucose-Capillary 52 (L) 70 - 99 mg/dL  Basic metabolic panel  Result Value Ref Range   Sodium 140 135 - 145 mmol/L   Potassium 4.3 3.5 - 5.1 mmol/L   Chloride 111 98 - 111 mmol/L   CO2 19 (L) 22 - 32 mmol/L   Glucose, Bld 183 (H) 70 - 99 mg/dL   BUN 41 (H) 8 - 23 mg/dL   Creatinine, Ser 1.47 (H) 0.44 - 1.00 mg/dL   Calcium 9.3 8.9 - 10.3 mg/dL   GFR calc non Af Amer 32 (L) >60 mL/min   GFR calc Af Amer 37 (L) >60 mL/min   Anion gap 10 5 - 15  CBC  Result Value Ref Range   WBC 12.7 (H) 4.0 - 10.5 K/uL   RBC 3.40 (L) 3.87 - 5.11 MIL/uL   Hemoglobin 10.1 (L) 12.0 - 15.0 g/dL   HCT 30.7 (L) 36.0 - 46.0 %   MCV 90.3 80.0 - 100.0 fL   MCH 29.7 26.0 - 34.0 pg   MCHC 32.9 30.0 - 36.0 g/dL   RDW 13.3 11.5 - 15.5 %   Platelets 213 150 - 400 K/uL   nRBC 0.0 0.0 - 0.2 %  Glucose, capillary  Result Value Ref Range   Glucose-Capillary 73 70 - 99 mg/dL  Glucose, capillary  Result Value Ref Range   Glucose-Capillary 143 (H) 70 - 99 mg/dL  Glucose, capillary  Result Value Ref Range   Glucose-Capillary 201 (H) 70 - 99 mg/dL  Glucose, capillary  Result Value Ref Range   Glucose-Capillary 239 (H) 70 - 99 mg/dL  Glucose, capillary  Result Value Ref Range   Glucose-Capillary 320 (H) 70 - 99 mg/dL  CBC  Result Value Ref Range   WBC 10.8 (H) 4.0 - 10.5 K/uL   RBC 3.21 (L) 3.87 - 5.11 MIL/uL   Hemoglobin 9.6 (L) 12.0 - 15.0 g/dL   HCT 28.8 (L) 36.0 - 46.0 %   MCV 89.7 80.0 - 100.0 fL   MCH 29.9 26.0 - 34.0  pg   MCHC 33.3 30.0 - 36.0 g/dL   RDW 13.4 11.5 - 15.5 %   Platelets 210 150 - 400 K/uL   nRBC 0.0 0.0 - 0.2 %  Basic metabolic panel  Result Value Ref Range   Sodium 135 135 - 145 mmol/L    Potassium 4.1 3.5 - 5.1 mmol/L   Chloride 107 98 - 111 mmol/L   CO2 19 (L) 22 - 32 mmol/L   Glucose, Bld 252 (H) 70 - 99 mg/dL   BUN 42 (H) 8 - 23 mg/dL   Creatinine, Ser 1.39 (H) 0.44 - 1.00 mg/dL   Calcium 9.4 8.9 - 10.3 mg/dL   GFR calc non Af Amer 34 (L) >60 mL/min   GFR calc Af Amer 40 (L) >60 mL/min   Anion gap 9 5 - 15  Glucose, capillary  Result Value Ref Range   Glucose-Capillary 298 (H) 70 - 99 mg/dL   Comment 1 Notify RN    Comment 2 Document in Chart   Glucose, capillary  Result Value Ref Range   Glucose-Capillary 207 (H) 70 - 99 mg/dL   Comment 1 Notify RN    Comment 2 Document in Chart   Glucose, capillary  Result Value Ref Range   Glucose-Capillary 141 (H) 70 - 99 mg/dL  Glucose, capillary  Result Value Ref Range   Glucose-Capillary 178 (H) 70 - 99 mg/dL  Glucose, capillary  Result Value Ref Range   Glucose-Capillary 196 (H) 70 - 99 mg/dL  Glucose, capillary  Result Value Ref Range   Glucose-Capillary 112 (H) 70 - 99 mg/dL  Glucose, capillary  Result Value Ref Range   Glucose-Capillary 109 (H) 70 - 99 mg/dL  Glucose, capillary  Result Value Ref Range   Glucose-Capillary 201 (H) 70 - 99 mg/dL  CBG monitoring, ED  Result Value Ref Range   Glucose-Capillary 254 (H) 70 - 99 mg/dL  ECHOCARDIOGRAM COMPLETE  Result Value Ref Range   Weight 2,483.26 oz   Height 66 in   BP 159/53 mmHg  Troponin I (High Sensitivity)  Result Value Ref Range   Troponin I (High Sensitivity) 736 (HH) <18 ng/L  Troponin I (High Sensitivity)  Result Value Ref Range   Troponin I (High Sensitivity) 657 (HH) <18 ng/L      Assessment & Plan:   1. Symptomatic bradycardia Continue cardiology  continue medication   2. Congestive heart failure, unspecified HF chronicity, unspecified heart failure type [facility]) Continue cardiology Continue medication routine  3. Mixed stress and urge urinary incontinence Continue the wearing of the protective things that she has at this time.   Research possible extraluminal catheter?  4. Nocturnal enuresis See above   Continue all other maintenance medications as listed above.  Follow up plan: Keep regular follow-up with Dr. Livia Snellen on 07/24/19  Educational handout given for [place] PA-C [place] [address]  [place], [place] [address] [phone]   07/06/2019, 1:01 PM

## 2019-07-07 ENCOUNTER — Ambulatory Visit (INDEPENDENT_AMBULATORY_CARE_PROVIDER_SITE_OTHER): Payer: Medicare Other | Admitting: Urology

## 2019-07-07 ENCOUNTER — Other Ambulatory Visit: Payer: Self-pay

## 2019-07-07 ENCOUNTER — Other Ambulatory Visit (HOSPITAL_COMMUNITY)
Admission: RE | Admit: 2019-07-07 | Discharge: 2019-07-07 | Disposition: A | Discharge status: Home / Self Care | Payer: Medicare Other | Source: Other Acute Inpatient Hospital | Attending: Urology | Admitting: Urology

## 2019-07-07 DIAGNOSIS — I083 Combined rheumatic disorders of mitral, aortic and tricuspid valves: Secondary | ICD-10-CM | POA: Diagnosis not present

## 2019-07-07 DIAGNOSIS — N3020 Other chronic cystitis without hematuria: Secondary | ICD-10-CM | POA: Diagnosis not present

## 2019-07-07 DIAGNOSIS — Z8673 Personal history of transient ischemic attack (TIA), and cerebral infarction without residual deficits: Secondary | ICD-10-CM | POA: Diagnosis not present

## 2019-07-07 DIAGNOSIS — M479 Spondylosis, unspecified: Secondary | ICD-10-CM | POA: Diagnosis not present

## 2019-07-07 DIAGNOSIS — N3946 Mixed incontinence: Secondary | ICD-10-CM | POA: Diagnosis not present

## 2019-07-07 DIAGNOSIS — N189 Chronic kidney disease, unspecified: Secondary | ICD-10-CM | POA: Diagnosis not present

## 2019-07-07 DIAGNOSIS — I6529 Occlusion and stenosis of unspecified carotid artery: Secondary | ICD-10-CM | POA: Diagnosis not present

## 2019-07-07 DIAGNOSIS — I5032 Chronic diastolic (congestive) heart failure: Secondary | ICD-10-CM | POA: Diagnosis not present

## 2019-07-07 DIAGNOSIS — E1122 Type 2 diabetes mellitus with diabetic chronic kidney disease: Secondary | ICD-10-CM | POA: Diagnosis not present

## 2019-07-07 DIAGNOSIS — I130 Hypertensive heart and chronic kidney disease with heart failure and stage 1 through stage 4 chronic kidney disease, or unspecified chronic kidney disease: Secondary | ICD-10-CM | POA: Diagnosis not present

## 2019-07-07 DIAGNOSIS — I252 Old myocardial infarction: Secondary | ICD-10-CM | POA: Diagnosis not present

## 2019-07-07 DIAGNOSIS — N183 Chronic kidney disease, stage 3 (moderate): Secondary | ICD-10-CM | POA: Diagnosis not present

## 2019-07-07 DIAGNOSIS — M8580 Other specified disorders of bone density and structure, unspecified site: Secondary | ICD-10-CM | POA: Diagnosis not present

## 2019-07-07 DIAGNOSIS — M419 Scoliosis, unspecified: Secondary | ICD-10-CM | POA: Diagnosis not present

## 2019-07-07 DIAGNOSIS — E785 Hyperlipidemia, unspecified: Secondary | ICD-10-CM | POA: Diagnosis not present

## 2019-07-07 DIAGNOSIS — D631 Anemia in chronic kidney disease: Secondary | ICD-10-CM | POA: Diagnosis not present

## 2019-07-07 DIAGNOSIS — M160 Bilateral primary osteoarthritis of hip: Secondary | ICD-10-CM | POA: Diagnosis not present

## 2019-07-07 DIAGNOSIS — Z794 Long term (current) use of insulin: Secondary | ICD-10-CM | POA: Diagnosis not present

## 2019-07-07 DIAGNOSIS — K635 Polyp of colon: Secondary | ICD-10-CM | POA: Diagnosis not present

## 2019-07-07 DIAGNOSIS — K219 Gastro-esophageal reflux disease without esophagitis: Secondary | ICD-10-CM | POA: Diagnosis not present

## 2019-07-07 DIAGNOSIS — Z9181 History of falling: Secondary | ICD-10-CM | POA: Diagnosis not present

## 2019-07-07 DIAGNOSIS — I2510 Atherosclerotic heart disease of native coronary artery without angina pectoris: Secondary | ICD-10-CM | POA: Diagnosis not present

## 2019-07-07 DIAGNOSIS — M1712 Unilateral primary osteoarthritis, left knee: Secondary | ICD-10-CM | POA: Diagnosis not present

## 2019-07-07 LAB — URINALYSIS, COMPLETE (UACMP) WITH MICROSCOPIC
Bilirubin Urine: NEGATIVE
Glucose, UA: NEGATIVE mg/dL
Hgb urine dipstick: NEGATIVE
Ketones, ur: NEGATIVE mg/dL
Nitrite: NEGATIVE
Protein, ur: 30 mg/dL — AB
Specific Gravity, Urine: 1.013 (ref 1.005–1.030)
pH: 5 (ref 5.0–8.0)

## 2019-07-09 LAB — URINE CULTURE: Culture: >=100000 — AB

## 2019-07-11 DIAGNOSIS — Z9181 History of falling: Secondary | ICD-10-CM | POA: Diagnosis not present

## 2019-07-11 DIAGNOSIS — I252 Old myocardial infarction: Secondary | ICD-10-CM | POA: Diagnosis not present

## 2019-07-11 DIAGNOSIS — Z794 Long term (current) use of insulin: Secondary | ICD-10-CM | POA: Diagnosis not present

## 2019-07-11 DIAGNOSIS — N183 Chronic kidney disease, stage 3 (moderate): Secondary | ICD-10-CM | POA: Diagnosis not present

## 2019-07-11 DIAGNOSIS — M1712 Unilateral primary osteoarthritis, left knee: Secondary | ICD-10-CM | POA: Diagnosis not present

## 2019-07-11 DIAGNOSIS — M419 Scoliosis, unspecified: Secondary | ICD-10-CM | POA: Diagnosis not present

## 2019-07-11 DIAGNOSIS — I2510 Atherosclerotic heart disease of native coronary artery without angina pectoris: Secondary | ICD-10-CM | POA: Diagnosis not present

## 2019-07-11 DIAGNOSIS — E1122 Type 2 diabetes mellitus with diabetic chronic kidney disease: Secondary | ICD-10-CM | POA: Diagnosis not present

## 2019-07-11 DIAGNOSIS — M160 Bilateral primary osteoarthritis of hip: Secondary | ICD-10-CM | POA: Diagnosis not present

## 2019-07-11 DIAGNOSIS — K219 Gastro-esophageal reflux disease without esophagitis: Secondary | ICD-10-CM | POA: Diagnosis not present

## 2019-07-11 DIAGNOSIS — M8580 Other specified disorders of bone density and structure, unspecified site: Secondary | ICD-10-CM | POA: Diagnosis not present

## 2019-07-11 DIAGNOSIS — I5032 Chronic diastolic (congestive) heart failure: Secondary | ICD-10-CM | POA: Diagnosis not present

## 2019-07-11 DIAGNOSIS — M479 Spondylosis, unspecified: Secondary | ICD-10-CM | POA: Diagnosis not present

## 2019-07-11 DIAGNOSIS — K635 Polyp of colon: Secondary | ICD-10-CM | POA: Diagnosis not present

## 2019-07-11 DIAGNOSIS — I130 Hypertensive heart and chronic kidney disease with heart failure and stage 1 through stage 4 chronic kidney disease, or unspecified chronic kidney disease: Secondary | ICD-10-CM | POA: Diagnosis not present

## 2019-07-11 DIAGNOSIS — I6529 Occlusion and stenosis of unspecified carotid artery: Secondary | ICD-10-CM | POA: Diagnosis not present

## 2019-07-11 DIAGNOSIS — E785 Hyperlipidemia, unspecified: Secondary | ICD-10-CM | POA: Diagnosis not present

## 2019-07-11 DIAGNOSIS — D631 Anemia in chronic kidney disease: Secondary | ICD-10-CM | POA: Diagnosis not present

## 2019-07-11 DIAGNOSIS — I083 Combined rheumatic disorders of mitral, aortic and tricuspid valves: Secondary | ICD-10-CM | POA: Diagnosis not present

## 2019-07-11 DIAGNOSIS — Z8673 Personal history of transient ischemic attack (TIA), and cerebral infarction without residual deficits: Secondary | ICD-10-CM | POA: Diagnosis not present

## 2019-07-13 ENCOUNTER — Other Ambulatory Visit: Payer: Self-pay

## 2019-07-13 ENCOUNTER — Ambulatory Visit (INDEPENDENT_AMBULATORY_CARE_PROVIDER_SITE_OTHER): Payer: Medicare Other

## 2019-07-13 DIAGNOSIS — N183 Chronic kidney disease, stage 3 (moderate): Secondary | ICD-10-CM

## 2019-07-13 DIAGNOSIS — M419 Scoliosis, unspecified: Secondary | ICD-10-CM

## 2019-07-13 DIAGNOSIS — I252 Old myocardial infarction: Secondary | ICD-10-CM | POA: Diagnosis not present

## 2019-07-13 DIAGNOSIS — Z6830 Body mass index (BMI) 30.0-30.9, adult: Secondary | ICD-10-CM

## 2019-07-13 DIAGNOSIS — Z9181 History of falling: Secondary | ICD-10-CM

## 2019-07-13 DIAGNOSIS — I083 Combined rheumatic disorders of mitral, aortic and tricuspid valves: Secondary | ICD-10-CM

## 2019-07-13 DIAGNOSIS — M479 Spondylosis, unspecified: Secondary | ICD-10-CM

## 2019-07-13 DIAGNOSIS — K635 Polyp of colon: Secondary | ICD-10-CM | POA: Diagnosis not present

## 2019-07-13 DIAGNOSIS — I130 Hypertensive heart and chronic kidney disease with heart failure and stage 1 through stage 4 chronic kidney disease, or unspecified chronic kidney disease: Secondary | ICD-10-CM

## 2019-07-13 DIAGNOSIS — Z8673 Personal history of transient ischemic attack (TIA), and cerebral infarction without residual deficits: Secondary | ICD-10-CM

## 2019-07-13 DIAGNOSIS — I6529 Occlusion and stenosis of unspecified carotid artery: Secondary | ICD-10-CM | POA: Diagnosis not present

## 2019-07-13 DIAGNOSIS — K219 Gastro-esophageal reflux disease without esophagitis: Secondary | ICD-10-CM | POA: Diagnosis not present

## 2019-07-13 DIAGNOSIS — E785 Hyperlipidemia, unspecified: Secondary | ICD-10-CM

## 2019-07-13 DIAGNOSIS — M8580 Other specified disorders of bone density and structure, unspecified site: Secondary | ICD-10-CM

## 2019-07-13 DIAGNOSIS — E1122 Type 2 diabetes mellitus with diabetic chronic kidney disease: Secondary | ICD-10-CM

## 2019-07-13 DIAGNOSIS — D631 Anemia in chronic kidney disease: Secondary | ICD-10-CM | POA: Diagnosis not present

## 2019-07-13 DIAGNOSIS — M160 Bilateral primary osteoarthritis of hip: Secondary | ICD-10-CM

## 2019-07-13 DIAGNOSIS — I5032 Chronic diastolic (congestive) heart failure: Secondary | ICD-10-CM | POA: Diagnosis not present

## 2019-07-13 DIAGNOSIS — F329 Major depressive disorder, single episode, unspecified: Secondary | ICD-10-CM

## 2019-07-13 DIAGNOSIS — M1712 Unilateral primary osteoarthritis, left knee: Secondary | ICD-10-CM | POA: Diagnosis not present

## 2019-07-13 DIAGNOSIS — F411 Generalized anxiety disorder: Secondary | ICD-10-CM

## 2019-07-13 DIAGNOSIS — F0390 Unspecified dementia without behavioral disturbance: Secondary | ICD-10-CM

## 2019-07-13 DIAGNOSIS — I2510 Atherosclerotic heart disease of native coronary artery without angina pectoris: Secondary | ICD-10-CM

## 2019-07-13 DIAGNOSIS — Z794 Long term (current) use of insulin: Secondary | ICD-10-CM

## 2019-07-13 DIAGNOSIS — E669 Obesity, unspecified: Secondary | ICD-10-CM

## 2019-07-18 ENCOUNTER — Inpatient Hospital Stay (HOSPITAL_COMMUNITY)
Admission: EM | Admit: 2019-07-18 | Discharge: 2019-07-25 | DRG: 242 | Disposition: A | Discharge status: Home / Self Care | Payer: Medicare Other | Attending: Internal Medicine | Admitting: Internal Medicine

## 2019-07-18 ENCOUNTER — Emergency Department (HOSPITAL_COMMUNITY): Payer: Medicare Other

## 2019-07-18 ENCOUNTER — Encounter (HOSPITAL_COMMUNITY): Payer: Self-pay | Admitting: Emergency Medicine

## 2019-07-18 ENCOUNTER — Other Ambulatory Visit: Payer: Self-pay

## 2019-07-18 DIAGNOSIS — I130 Hypertensive heart and chronic kidney disease with heart failure and stage 1 through stage 4 chronic kidney disease, or unspecified chronic kidney disease: Secondary | ICD-10-CM | POA: Diagnosis present

## 2019-07-18 DIAGNOSIS — Z950 Presence of cardiac pacemaker: Secondary | ICD-10-CM

## 2019-07-18 DIAGNOSIS — I350 Nonrheumatic aortic (valve) stenosis: Secondary | ICD-10-CM | POA: Diagnosis not present

## 2019-07-18 DIAGNOSIS — R0602 Shortness of breath: Secondary | ICD-10-CM

## 2019-07-18 DIAGNOSIS — K219 Gastro-esophageal reflux disease without esophagitis: Secondary | ICD-10-CM | POA: Diagnosis present

## 2019-07-18 DIAGNOSIS — I6529 Occlusion and stenosis of unspecified carotid artery: Secondary | ICD-10-CM | POA: Diagnosis present

## 2019-07-18 DIAGNOSIS — I4510 Unspecified right bundle-branch block: Secondary | ICD-10-CM | POA: Diagnosis present

## 2019-07-18 DIAGNOSIS — H5462 Unqualified visual loss, left eye, normal vision right eye: Secondary | ICD-10-CM | POA: Diagnosis present

## 2019-07-18 DIAGNOSIS — Z7902 Long term (current) use of antithrombotics/antiplatelets: Secondary | ICD-10-CM

## 2019-07-18 DIAGNOSIS — D631 Anemia in chronic kidney disease: Secondary | ICD-10-CM | POA: Diagnosis present

## 2019-07-18 DIAGNOSIS — Z7982 Long term (current) use of aspirin: Secondary | ICD-10-CM

## 2019-07-18 DIAGNOSIS — I252 Old myocardial infarction: Secondary | ICD-10-CM

## 2019-07-18 DIAGNOSIS — Z8262 Family history of osteoporosis: Secondary | ICD-10-CM

## 2019-07-18 DIAGNOSIS — I4891 Unspecified atrial fibrillation: Principal | ICD-10-CM | POA: Diagnosis present

## 2019-07-18 DIAGNOSIS — E1122 Type 2 diabetes mellitus with diabetic chronic kidney disease: Secondary | ICD-10-CM | POA: Diagnosis present

## 2019-07-18 DIAGNOSIS — E1165 Type 2 diabetes mellitus with hyperglycemia: Secondary | ICD-10-CM | POA: Diagnosis not present

## 2019-07-18 DIAGNOSIS — I080 Rheumatic disorders of both mitral and aortic valves: Secondary | ICD-10-CM | POA: Diagnosis present

## 2019-07-18 DIAGNOSIS — Z888 Allergy status to other drugs, medicaments and biological substances status: Secondary | ICD-10-CM

## 2019-07-18 DIAGNOSIS — Z794 Long term (current) use of insulin: Secondary | ICD-10-CM

## 2019-07-18 DIAGNOSIS — Z833 Family history of diabetes mellitus: Secondary | ICD-10-CM

## 2019-07-18 DIAGNOSIS — D638 Anemia in other chronic diseases classified elsewhere: Secondary | ICD-10-CM | POA: Diagnosis not present

## 2019-07-18 DIAGNOSIS — Z9049 Acquired absence of other specified parts of digestive tract: Secondary | ICD-10-CM

## 2019-07-18 DIAGNOSIS — R296 Repeated falls: Secondary | ICD-10-CM | POA: Diagnosis present

## 2019-07-18 DIAGNOSIS — I5033 Acute on chronic diastolic (congestive) heart failure: Secondary | ICD-10-CM | POA: Diagnosis not present

## 2019-07-18 DIAGNOSIS — N179 Acute kidney failure, unspecified: Secondary | ICD-10-CM | POA: Diagnosis not present

## 2019-07-18 DIAGNOSIS — N183 Chronic kidney disease, stage 3 (moderate): Secondary | ICD-10-CM | POA: Diagnosis not present

## 2019-07-18 DIAGNOSIS — Z955 Presence of coronary angioplasty implant and graft: Secondary | ICD-10-CM

## 2019-07-18 DIAGNOSIS — I69328 Other speech and language deficits following cerebral infarction: Secondary | ICD-10-CM | POA: Diagnosis not present

## 2019-07-18 DIAGNOSIS — Z951 Presence of aortocoronary bypass graft: Secondary | ICD-10-CM

## 2019-07-18 DIAGNOSIS — I5030 Unspecified diastolic (congestive) heart failure: Secondary | ICD-10-CM | POA: Diagnosis present

## 2019-07-18 DIAGNOSIS — Z79899 Other long term (current) drug therapy: Secondary | ICD-10-CM

## 2019-07-18 DIAGNOSIS — I493 Ventricular premature depolarization: Secondary | ICD-10-CM | POA: Diagnosis present

## 2019-07-18 DIAGNOSIS — Z9841 Cataract extraction status, right eye: Secondary | ICD-10-CM

## 2019-07-18 DIAGNOSIS — Z96651 Presence of right artificial knee joint: Secondary | ICD-10-CM | POA: Diagnosis present

## 2019-07-18 DIAGNOSIS — E875 Hyperkalemia: Secondary | ICD-10-CM | POA: Diagnosis not present

## 2019-07-18 DIAGNOSIS — N390 Urinary tract infection, site not specified: Secondary | ICD-10-CM | POA: Diagnosis not present

## 2019-07-18 DIAGNOSIS — I38 Endocarditis, valve unspecified: Secondary | ICD-10-CM | POA: Diagnosis not present

## 2019-07-18 DIAGNOSIS — H9190 Unspecified hearing loss, unspecified ear: Secondary | ICD-10-CM | POA: Diagnosis present

## 2019-07-18 DIAGNOSIS — I050 Rheumatic mitral stenosis: Secondary | ICD-10-CM | POA: Diagnosis not present

## 2019-07-18 DIAGNOSIS — Z9842 Cataract extraction status, left eye: Secondary | ICD-10-CM

## 2019-07-18 DIAGNOSIS — Z90710 Acquired absence of both cervix and uterus: Secondary | ICD-10-CM

## 2019-07-18 DIAGNOSIS — E785 Hyperlipidemia, unspecified: Secondary | ICD-10-CM | POA: Diagnosis present

## 2019-07-18 DIAGNOSIS — I10 Essential (primary) hypertension: Secondary | ICD-10-CM | POA: Diagnosis not present

## 2019-07-18 DIAGNOSIS — F419 Anxiety disorder, unspecified: Secondary | ICD-10-CM | POA: Diagnosis present

## 2019-07-18 DIAGNOSIS — Z20828 Contact with and (suspected) exposure to other viral communicable diseases: Secondary | ICD-10-CM | POA: Diagnosis present

## 2019-07-18 DIAGNOSIS — I495 Sick sinus syndrome: Secondary | ICD-10-CM | POA: Diagnosis present

## 2019-07-18 DIAGNOSIS — J45909 Unspecified asthma, uncomplicated: Secondary | ICD-10-CM | POA: Diagnosis present

## 2019-07-18 DIAGNOSIS — I2510 Atherosclerotic heart disease of native coronary artery without angina pectoris: Secondary | ICD-10-CM | POA: Diagnosis present

## 2019-07-18 DIAGNOSIS — F0390 Unspecified dementia without behavioral disturbance: Secondary | ICD-10-CM | POA: Diagnosis present

## 2019-07-18 DIAGNOSIS — E119 Type 2 diabetes mellitus without complications: Secondary | ICD-10-CM

## 2019-07-18 DIAGNOSIS — B9620 Unspecified Escherichia coli [E. coli] as the cause of diseases classified elsewhere: Secondary | ICD-10-CM | POA: Diagnosis present

## 2019-07-18 DIAGNOSIS — Z8042 Family history of malignant neoplasm of prostate: Secondary | ICD-10-CM

## 2019-07-18 DIAGNOSIS — K5909 Other constipation: Secondary | ICD-10-CM | POA: Diagnosis present

## 2019-07-18 DIAGNOSIS — I25708 Atherosclerosis of coronary artery bypass graft(s), unspecified, with other forms of angina pectoris: Secondary | ICD-10-CM | POA: Diagnosis not present

## 2019-07-18 DIAGNOSIS — Z8249 Family history of ischemic heart disease and other diseases of the circulatory system: Secondary | ICD-10-CM

## 2019-07-18 LAB — CBC WITH DIFFERENTIAL/PLATELET
Abs Immature Granulocytes: 0.02 10*3/uL (ref 0.00–0.07)
Basophils Absolute: 0.1 10*3/uL (ref 0.0–0.1)
Basophils Relative: 1 %
Eosinophils Absolute: 0.1 10*3/uL (ref 0.0–0.5)
Eosinophils Relative: 1 %
HCT: 30.2 % — ABNORMAL LOW (ref 36.0–46.0)
Hemoglobin: 9.2 g/dL — ABNORMAL LOW (ref 12.0–15.0)
Immature Granulocytes: 0 %
Lymphocytes Relative: 10 %
Lymphs Abs: 0.6 10*3/uL — ABNORMAL LOW (ref 0.7–4.0)
MCH: 28.9 pg (ref 26.0–34.0)
MCHC: 30.5 g/dL (ref 30.0–36.0)
MCV: 95 fL (ref 80.0–100.0)
Monocytes Absolute: 0.3 10*3/uL (ref 0.1–1.0)
Monocytes Relative: 5 %
Neutro Abs: 5.3 10*3/uL (ref 1.7–7.7)
Neutrophils Relative %: 83 %
Platelets: 217 10*3/uL (ref 150–400)
RBC: 3.18 MIL/uL — ABNORMAL LOW (ref 3.87–5.11)
RDW: 14.3 % (ref 11.5–15.5)
WBC: 6.4 10*3/uL (ref 4.0–10.5)
nRBC: 0 % (ref 0.0–0.2)

## 2019-07-18 LAB — URINALYSIS, ROUTINE W REFLEX MICROSCOPIC
Bilirubin Urine: NEGATIVE
Glucose, UA: NEGATIVE mg/dL
Hgb urine dipstick: NEGATIVE
Ketones, ur: NEGATIVE mg/dL
Nitrite: POSITIVE — AB
Protein, ur: 30 mg/dL — AB
Specific Gravity, Urine: 1.013 (ref 1.005–1.030)
pH: 5 (ref 5.0–8.0)

## 2019-07-18 LAB — COMPREHENSIVE METABOLIC PANEL
ALT: 17 U/L (ref 0–44)
AST: 17 U/L (ref 15–41)
Albumin: 3.5 g/dL (ref 3.5–5.0)
Alkaline Phosphatase: 41 U/L (ref 38–126)
Anion gap: 10 (ref 5–15)
BUN: 37 mg/dL — ABNORMAL HIGH (ref 8–23)
CO2: 24 mmol/L (ref 22–32)
Calcium: 9.3 mg/dL (ref 8.9–10.3)
Chloride: 103 mmol/L (ref 98–111)
Creatinine, Ser: 1.75 mg/dL — ABNORMAL HIGH (ref 0.44–1.00)
GFR calc Af Amer: 30 mL/min — ABNORMAL LOW (ref >60)
GFR calc non Af Amer: 26 mL/min — ABNORMAL LOW (ref >60)
Glucose, Bld: 171 mg/dL — ABNORMAL HIGH (ref 70–99)
Potassium: 5.2 mmol/L — ABNORMAL HIGH (ref 3.5–5.1)
Sodium: 137 mmol/L (ref 135–145)
Total Bilirubin: 0.7 mg/dL (ref 0.3–1.2)
Total Protein: 6.7 g/dL (ref 6.5–8.1)

## 2019-07-18 LAB — SARS CORONAVIRUS 2 BY RT PCR (HOSPITAL ORDER, PERFORMED IN ~~LOC~~ HOSPITAL LAB): SARS Coronavirus 2: NEGATIVE

## 2019-07-18 LAB — GLUCOSE, CAPILLARY: Glucose-Capillary: 247 mg/dL — ABNORMAL HIGH (ref 70–99)

## 2019-07-18 LAB — BRAIN NATRIURETIC PEPTIDE: B Natriuretic Peptide: 283 pg/mL — ABNORMAL HIGH (ref 0.0–100.0)

## 2019-07-18 LAB — LIPASE, BLOOD: Lipase: 25 U/L (ref 11–51)

## 2019-07-18 MED ORDER — HYDRALAZINE HCL 20 MG/ML IJ SOLN: 10.0000 mg | Freq: Four times a day (QID) | INTRAMUSCULAR | Status: DC | PRN

## 2019-07-18 MED ORDER — CLOPIDOGREL BISULFATE 75 MG PO TABS
75.0000 mg | ORAL_TABLET | Freq: Every day | ORAL | Status: DC
  Administered 2019-07-19 – 2019-07-25 (×7): 75 mg via ORAL
  Filled 2019-07-18 (×7): qty 1

## 2019-07-18 MED ORDER — ACETAMINOPHEN 650 MG RE SUPP: 650.0000 mg | Freq: Four times a day (QID) | RECTAL | Status: DC | PRN

## 2019-07-18 MED ORDER — ALBUTEROL SULFATE HFA 108 (90 BASE) MCG/ACT IN AERS: 2.0000 | INHALATION_SPRAY | Freq: Four times a day (QID) | RESPIRATORY_TRACT | Status: DC | PRN

## 2019-07-18 MED ORDER — ONDANSETRON HCL 4 MG PO TABS: 4.0000 mg | ORAL_TABLET | Freq: Four times a day (QID) | ORAL | Status: DC | PRN

## 2019-07-18 MED ORDER — INSULIN DETEMIR 100 UNIT/ML ~~LOC~~ SOLN
24.0000 [IU] | Freq: Every morning | SUBCUTANEOUS | Status: DC
  Administered 2019-07-19 – 2019-07-21 (×3): 24 [IU] via SUBCUTANEOUS
  Filled 2019-07-18 (×6): qty 0.24

## 2019-07-18 MED ORDER — FUROSEMIDE 10 MG/ML IJ SOLN
40.0000 mg | Freq: Once | INTRAMUSCULAR | Status: AC
  Administered 2019-07-18: 40 mg via INTRAVENOUS
  Filled 2019-07-18: qty 4

## 2019-07-18 MED ORDER — PAROXETINE HCL 20 MG PO TABS
20.0000 mg | ORAL_TABLET | Freq: Every morning | ORAL | Status: DC
  Administered 2019-07-19 – 2019-07-25 (×7): 20 mg via ORAL
  Filled 2019-07-18 (×7): qty 1

## 2019-07-18 MED ORDER — FERROUS SULFATE 325 (65 FE) MG PO TABS
324.0000 mg | ORAL_TABLET | Freq: Every day | ORAL | Status: DC
  Administered 2019-07-19 – 2019-07-25 (×7): 324 mg via ORAL
  Filled 2019-07-18 (×7): qty 1

## 2019-07-18 MED ORDER — ALBUTEROL SULFATE (2.5 MG/3ML) 0.083% IN NEBU: 2.5000 mg | INHALATION_SOLUTION | RESPIRATORY_TRACT | Status: DC | PRN

## 2019-07-18 MED ORDER — ATORVASTATIN CALCIUM 10 MG PO TABS
20.0000 mg | ORAL_TABLET | Freq: Every day | ORAL | Status: DC
  Administered 2019-07-18 – 2019-07-24 (×7): 20 mg via ORAL
  Filled 2019-07-18: qty 2
  Filled 2019-07-18 (×2): qty 1
  Filled 2019-07-18: qty 2
  Filled 2019-07-18: qty 1
  Filled 2019-07-18: qty 2
  Filled 2019-07-18: qty 1

## 2019-07-18 MED ORDER — SODIUM CHLORIDE 0.9 % IV SOLN: 250.0000 mL | INTRAVENOUS | Status: DC | PRN

## 2019-07-18 MED ORDER — INSULIN ASPART 100 UNIT/ML ~~LOC~~ SOLN
0.0000 [IU] | Freq: Three times a day (TID) | SUBCUTANEOUS | Status: DC
  Administered 2019-07-19 (×2): 2 [IU] via SUBCUTANEOUS
  Administered 2019-07-19 – 2019-07-20 (×2): 3 [IU] via SUBCUTANEOUS
  Administered 2019-07-20: 13:00:00 2 [IU] via SUBCUTANEOUS
  Administered 2019-07-21: 17:00:00 1 [IU] via SUBCUTANEOUS
  Administered 2019-07-22: 7 [IU] via SUBCUTANEOUS
  Administered 2019-07-22: 1 [IU] via SUBCUTANEOUS
  Administered 2019-07-23: 5 [IU] via SUBCUTANEOUS
  Administered 2019-07-23 – 2019-07-24 (×2): 3 [IU] via SUBCUTANEOUS
  Administered 2019-07-24: 1 [IU] via SUBCUTANEOUS
  Administered 2019-07-25: 2 [IU] via SUBCUTANEOUS

## 2019-07-18 MED ORDER — LABETALOL HCL 5 MG/ML IV SOLN: 10.0000 mg | INTRAVENOUS | Status: DC | PRN

## 2019-07-18 MED ORDER — POLYETHYLENE GLYCOL 3350 17 G PO PACK: 17.0000 g | PACK | Freq: Every day | ORAL | Status: DC | PRN

## 2019-07-18 MED ORDER — SODIUM CHLORIDE 0.9% FLUSH
3.0000 mL | Freq: Two times a day (BID) | INTRAVENOUS | Status: DC
  Administered 2019-07-18 – 2019-07-25 (×13): 3 mL via INTRAVENOUS

## 2019-07-18 MED ORDER — ONDANSETRON HCL 4 MG/2ML IJ SOLN: 4.0000 mg | Freq: Four times a day (QID) | INTRAMUSCULAR | Status: DC | PRN

## 2019-07-18 MED ORDER — ASPIRIN 81 MG PO CHEW
81.0000 mg | CHEWABLE_TABLET | Freq: Every day | ORAL | Status: DC
  Administered 2019-07-19 – 2019-07-25 (×7): 81 mg via ORAL
  Filled 2019-07-18 (×7): qty 1

## 2019-07-18 MED ORDER — METOPROLOL TARTRATE 50 MG PO TABS
50.0000 mg | ORAL_TABLET | Freq: Two times a day (BID) | ORAL | Status: DC
  Administered 2019-07-18: 50 mg via ORAL
  Filled 2019-07-18: qty 1

## 2019-07-18 MED ORDER — ACETAMINOPHEN 325 MG PO TABS
650.0000 mg | ORAL_TABLET | Freq: Four times a day (QID) | ORAL | Status: DC | PRN
  Administered 2019-07-20 – 2019-07-25 (×4): 650 mg via ORAL
  Filled 2019-07-18 (×5): qty 2

## 2019-07-18 MED ORDER — POLYETHYLENE GLYCOL 3350 17 G PO PACK
17.0000 g | PACK | Freq: Every day | ORAL | Status: DC
  Administered 2019-07-19 – 2019-07-25 (×7): 17 g via ORAL
  Filled 2019-07-18 (×7): qty 1

## 2019-07-18 MED ORDER — ENOXAPARIN SODIUM 30 MG/0.3ML ~~LOC~~ SOLN
30.0000 mg | SUBCUTANEOUS | Status: DC
  Administered 2019-07-20 – 2019-07-24 (×5): 30 mg via SUBCUTANEOUS
  Filled 2019-07-18 (×5): qty 0.3

## 2019-07-18 MED ORDER — SODIUM CHLORIDE 0.9% FLUSH: 3.0000 mL | INTRAVENOUS | Status: DC | PRN

## 2019-07-18 MED ORDER — PANTOPRAZOLE SODIUM 40 MG PO TBEC: 40.0000 mg | DELAYED_RELEASE_TABLET | Freq: Every day | ORAL | Status: DC

## 2019-07-18 MED ORDER — HYDRALAZINE HCL 25 MG PO TABS
50.0000 mg | ORAL_TABLET | Freq: Three times a day (TID) | ORAL | Status: DC
  Administered 2019-07-18 – 2019-07-21 (×8): 50 mg via ORAL
  Filled 2019-07-18 (×8): qty 2

## 2019-07-18 MED ORDER — PANTOPRAZOLE SODIUM 40 MG PO TBEC
40.0000 mg | DELAYED_RELEASE_TABLET | Freq: Every day | ORAL | Status: DC
  Administered 2019-07-19 – 2019-07-25 (×7): 40 mg via ORAL
  Filled 2019-07-18 (×8): qty 1

## 2019-07-18 MED ORDER — METOPROLOL TARTRATE 25 MG PO TABS: 25.0000 mg | ORAL_TABLET | Freq: Two times a day (BID) | ORAL | Status: DC

## 2019-07-18 MED ORDER — TRAZODONE HCL 50 MG PO TABS
50.0000 mg | ORAL_TABLET | Freq: Every evening | ORAL | Status: DC | PRN
  Filled 2019-07-18 (×2): qty 1

## 2019-07-18 MED ORDER — FUROSEMIDE 10 MG/ML IJ SOLN
40.0000 mg | Freq: Two times a day (BID) | INTRAMUSCULAR | Status: DC
  Administered 2019-07-19 – 2019-07-21 (×4): 40 mg via INTRAVENOUS
  Filled 2019-07-18 (×4): qty 4

## 2019-07-18 MED ORDER — INSULIN ASPART 100 UNIT/ML ~~LOC~~ SOLN
0.0000 [IU] | Freq: Every day | SUBCUTANEOUS | Status: DC
  Administered 2019-07-18 – 2019-07-22 (×2): 2 [IU] via SUBCUTANEOUS

## 2019-07-18 NOTE — H&P (Signed)
Patient Demographics:    [name], is a 85 y.o. female  MRN: [id number]   DOB - 1931-06-11  Admit Date - 07/18/2019  Outpatient Primary MD for the patient is [name], MD   Assessment & Plan:    Active Problems:   New onset a-fib [facility])   1)Tachyarrhythmia/New onset A. Fib----recent episode of prolonged symptomatic bradycardia with heart rate in the 40s(junctional/ectopic atrial rhythm) -This is all in setting of aortic stenosis   -Metoprolol was discontinued on 06/28/2019 -Await cardiology input, patient may need pacemaker for possible tachybradycardia syndrome --Avoid AV nodal blocking agents for now  -  2) HFpEF/ Chronic diastolic heart failure--- now presenting with acute on chronic diastolic CHF exacerbation in the setting of tachyarrhythmia and moderate aortic stenosis  ---  -IV Lasix 40 mg every 12 hours, daily weights, input and output charting, -Troponins not elevated  3)CAD--- status post prior coronary angioplasty with 5 stents prior to 2005, CABG 2006, follows with Dr. Percival Spanish- troponin is not elevated, no ACS type symptoms, continue aspirin, continue Lipitor, continue Plavix, unable to give beta-blocker due to history of significant bradycardia  4)AKI----acute kidney injury on CKD stage - III--suspect worsening cardiac failure due to poor renal perfusion in the setting of CHF exacerbation and tachyarrhythmia    creatinine on admission= 1.75 ,   baseline creatinine = 1.3-1.4 , renally adjust medications, avoid nephrotoxic agents/dehydration/hypotension --Watch renal function closely with aggressive diuresis for CHF   -Hold metformin -Potassium is 5.2 monitor closely  5) social/ethics--- plan of care discussed with patient and daughter ([name]), patient is a full code  6) Diabetes  mellitus- A1c 8.5 weeks ago weeks ago reflecting uncontrolled diabetes - -= hold metformin due to kidney concerns -resume Levemir 24 units every morning along with sliding scale coverage  7)CVA-with residual speech deficit- patient mostly independent of her ADLs.  Continue aspirin and Lipitor   8)Chronic anemia-hemoglobin 9.2, which is close to patient's baseline -No evidence for bleeding  9) moderate aortic stenosis and moderate mitral stenosis--- ??  Echo from 06/29/2019 noted, await cardiology consult   With History of - Reviewed by me  Past Medical History:  Diagnosis Date  . Anemia   . Anxiety   . Aortic stenosis   . Arthritis    back, knees, and hips  . Asthma    with allergies  . Balance problems   . CAD (coronary artery disease)   . Carotid stenosis   . CHF (congestive heart failure) ([place])    EF preserved Echo 2012  . CKD (chronic kidney disease)   . 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 Dementia ([place])    "a little"  . Depression   . Diabetes mellitus    type 2  . GERD (gastroesophageal reflux disease)   . Hypertension   . Iron deficiency anemia 05/27/2011  .  Myocardial infarction ([place])   . Renal insufficiency   . Scoliosis   . Vertigo    "when sugar gets low"  . Vision loss    left eye-"half blind"      Past Surgical History:  Procedure Laterality Date  . APPENDECTOMY     with hysterectomy  . CATARACT EXTRACTION Bilateral 5 years ago  . CHOLECYSTECTOMY    . COLONOSCOPY  2005   Dr. Amedeo Plenty: Diverticulosis  . COLONOSCOPY WITH PROPOFOL N/A 02/14/2019   Procedure: COLONOSCOPY WITH PROPOFOL;  Surgeon: Danie Binder, MD;  Location: [facility];  Service: Endoscopy;  Laterality: N/A;  . CORONARY ANGIOPLASTY     prior to 2006 5 stents  . CORONARY ARTERY BYPASS GRAFT  2006  . [facility] [place]  April 2013  . KNEE SURGERY Right   . POLYPECTOMY  02/14/2019   Procedure:  POLYPECTOMY;  Surgeon: Danie Binder, MD;  Location: [facility];  Service: Endoscopy;;  cecal polyp  . TOTAL ABDOMINAL HYSTERECTOMY  ~85 years old   complete, with tumor removal  . TOTAL KNEE ARTHROPLASTY Right 08/13/2015   Procedure: RIGHT  TOTAL KNEE ARTHROPLASTY;  Surgeon: Paralee Cancel, MD;  Location: [facility];  Service: Orthopedics;  Laterality: Right;      Chief Complaint  Patient presents with  . Abdominal Pain      HPI:    Thereasa Figueroa  is a 85 y.o. female with PMHx relevant for   CAD s/p CABG (2006), carotic artery stenosis, HTN with hx of hypertensive urgency, CKD stage III, DM, dyslipidemia, GERD, and chronic diastolic heart failure, prior CVA with residual slurred speech,  aortic stenosis, CKD 3, asthma, hypertension and diabetes recent history of bradycardia with junctional bradycardia now presents with tachyarrhythmia/new onset A. fib shortness of breath---  --Her metoprolol was discontinued after presenting with persistent bradycardia around July 22  --In ED patient was found to be with presumed A. fib heart rate around 120  --No chest pains,  --She does have shortness of breath -No productive cough, no fevers no chills --Had left lower quadrant discomfort and then today, no nausea no vomiting no diarrhea -WBC 6.4 -COVID-19 testing negative  In ED UA suggestive of UTI -Creatinine is 1.75 up from 1.4 couple of weeks ago -Chest x-ray consistent with CHF/edema, BNP 283   Additional history obtained from patient's daughter -[name] by phone   Review of systems:    In addition to the HPI above,   A full Review of  Systems was done, all other systems reviewed are negative except as noted above in HPI , .    Social History:  Reviewed by me    Social History   Tobacco Use  . Smoking status: Never Smoker  . Smokeless tobacco: Never Used  Substance Use Topics  . Alcohol use: No       Family History :  Reviewed by me    Family History  Problem  Relation Age of Onset  . Cancer Brother        porstate  . Early death Sister   . Heart disease Brother   . Heart disease Brother   . Heart attack Brother   . Heart disease Brother   . Heart attack Brother   . Diabetes Sister   . Heart attack Sister   . Diabetes Sister   . Osteoporosis Sister   . Hypertension Sister   . Heart attack Son   . Early death Son   .  Pancreatitis Son   . Heart attack Daughter 16    Home Medications:   Prior to Admission medications   Medication Sig Start Date End Date Taking? Authorizing Provider  albuterol (PROVENTIL HFA;VENTOLIN HFA) 108 (90 Base) MCG/ACT inhaler Inhale 2 puffs into the lungs every 6 (six) hours as needed for wheezing or shortness of breath. 01/20/19  Yes Stacks, Cletus Gash, MD  amLODipine (NORVASC) 10 MG tablet TAKE 1 TABLET ONCE DAILY. 07/06/19  Yes [name], MD  aspirin 81 MG chewable tablet Chew 81 mg by mouth at bedtime. 08/04/10  Yes [provider]  atorvastatin (LIPITOR) 20 MG tablet Take 1 tablet (20 mg total) by mouth at bedtime. 10/18/18  Yes Stacks, Cletus Gash, MD  Carboxymethylcellul-Glycerin (CLEAR EYES FOR DRY EYES) 1-0.25 % SOLN Place 1 drop into both eyes daily as needed.   Yes [provider]  clopidogrel (PLAVIX) 75 MG tablet TAKE 1 TABLET DAILY. Patient taking differently: Take 75 mg by mouth daily.  02/03/19  Yes Stacks, Cletus Gash, MD  esomeprazole (NEXIUM) 40 MG capsule TAKE 1 CAPSULE DAILY. 07/06/19  Yes [name], MD  ferrous sulfate 324 (65 Fe) MG TBEC Take 324 mg by mouth daily.    Yes [provider]  furosemide (LASIX) 20 MG tablet Take 1 tablet (20 mg total) by mouth every morning. 07/02/19  Yes Domenic Polite, MD  hydrALAZINE (APRESOLINE) 50 MG tablet TAKE 1 TABLET BY MOUTH 3 TIMES DAILY. 07/06/19  Yes Stacks, Cletus Gash, MD  insulin lispro (HUMALOG KWIKPEN) 100 UNIT/ML KiwkPen Inject 0.04-0.1 mLs (4-10 Units total) into the skin daily as needed (high blood sugar). Patient taking  differently: Inject 5-15 Units into the skin daily as needed (for high blood sugar levels over 250).  07/12/18  Yes Stacks, Cletus Gash, MD  LEVEMIR FLEXTOUCH 100 UNIT/ML Pen INJECT [place] MORNING Patient taking differently: Inject 24 Units into the skin every morning.  06/19/19  Yes Stacks, Cletus Gash, MD  lisinopril (ZESTRIL) 20 MG tablet Take 20 mg by mouth daily. 07/05/19  Yes [provider]  magnesium hydroxide (MILK OF MAGNESIA) 400 MG/5ML suspension Take 30 mLs by mouth daily as needed for mild constipation.   Yes [provider]  metFORMIN (GLUCOPHAGE-XR) 500 MG 24 hr tablet TAKE 1 TABLET 2 TIMES A DAY WITH BREAKFAST & DINNER. Patient taking differently: Take 500 mg by mouth 2 (two) times daily.  07/06/19  Yes Stacks, Cletus Gash, MD  Multiple Vitamins-Iron (DAILY VITAMINS/IRON/BETA CAROT PO) Take 1 tablet by mouth at bedtime.   Yes [provider]  nitroGLYCERIN (NITROSTAT) 0.4 MG SL tablet PLACE (1) TABLET UNDER TONGUE EVERY 5 MINUTES UP TO (3) DOSES. IF NO RELIEF CALL 911. 07/06/19  Yes Stacks, Cletus Gash, MD  PARoxetine (PAXIL) 20 MG tablet TAKE 1 TABLET BY MOUTH EVERY MORNING. 07/06/19  Yes Stacks, Cletus Gash, MD  polyethylene glycol [facility]) packet Take 17 g by mouth daily. Patient taking differently: Take 17 g by mouth daily as needed for mild constipation or moderate constipation.  07/25/18  Yes Maudie Flakes, MD     Allergies:     Allergies  Allergen Reactions  . Advil [Ibuprofen] Swelling  . Heparin Other (See Comments)    Confusion, hallucinations.   . Propoxyphene N-Acetaminophen Other (See Comments)    Numbness all over. "floating" sensation.     Physical Exam:   Vitals  Blood pressure (!) 152/95, pulse 85, temperature 97.6 F (36.4 C), temperature source Oral, resp. rate 20, height 5\' 6"  (1.676 m), weight 73.9 kg, SpO2  96 %.  Physical Examination: General appearance - alert, and in no distress Mental status - alert, oriented to person, place, and time,   Eyes - sclera anicteric Neck - supple,   Chest -diminished in bases with bibasilar rales Heart - S1 and S2 normal, irregular, tachycardic, 3/6 SM  Abdomen - soft, nontender, nondistended, no masses or organomegaly Neurological - screening mental status exam normal, neck supple without rigidity,  DTR's normal and symmetric, slurred speech which is her baseline Extremities - trace pedal edema noted, intact peripheral pulses  Skin - warm, dry     Data Review:    CBC Recent Labs  Lab 07/18/19 1004  WBC 6.4  HGB 9.2*  HCT 30.2*  PLT 217  MCV 95.0  MCH 28.9  MCHC 30.5  RDW 14.3  LYMPHSABS 0.6*  MONOABS 0.3  EOSABS 0.1  BASOSABS 0.1   ------------------------------------------------------------------------------------------------------------------  Chemistries  Recent Labs  Lab 07/18/19 1004  NA 137  K 5.2*  CL 103  CO2 24  GLUCOSE 171*  BUN 37*  CREATININE 1.75*  CALCIUM 9.3  AST 17  ALT 17  ALKPHOS 41  BILITOT 0.7   ------------------------------------------------------------------------------------------------------------------ estimated creatinine clearance is 24.2 mL/min (A) (by C-G formula based on SCr of 1.75 mg/dL (H)). ------------------------------------------------------------------------------------------------------------------ No results for input(s): TSH, T4TOTAL, T3FREE, THYROIDAB in the last 72 hours.  Invalid input(s): FREET3   Coagulation profile No results for input(s): INR, PROTIME in the last 168 hours. ------------------------------------------------------------------------------------------------------------------- No results for input(s): DDIMER in the last 72 hours. -------------------------------------------------------------------------------------------------------------------  Cardiac Enzymes No results for input(s): CKMB, TROPONINI, MYOGLOBIN in the last 168 hours.  Invalid input(s): CK  ------------------------------------------------------------------------------------------------------------------    Component Value Date/Time   BNP 283.0 (H) 07/18/2019 1004     ---------------------------------------------------------------------------------------------------------------  Urinalysis    Component Value Date/Time   COLORURINE YELLOW 07/18/2019 1020   APPEARANCEUR HAZY (A) 07/18/2019 1020   APPEARANCEUR Clear 09/27/2017 1056   LABSPEC 1.013 07/18/2019 1020   PHURINE 5.0 07/18/2019 1020   GLUCOSEU NEGATIVE 07/18/2019 1020   HGBUR NEGATIVE 07/18/2019 1020   BILIRUBINUR NEGATIVE 07/18/2019 1020   BILIRUBINUR Negative 09/27/2017 1056   KETONESUR NEGATIVE 07/18/2019 1020   PROTEINUR 30 (A) 07/18/2019 1020   UROBILINOGEN negative 02/03/2016 1516   UROBILINOGEN 0.2 10/17/2015 1915   NITRITE POSITIVE (A) 07/18/2019 1020   LEUKOCYTESUR TRACE (A) 07/18/2019 1020    ----------------------------------------------------------------------------------------------------------------   Imaging Results:    Dg Chest 2 View  Result Date: 07/18/2019 CLINICAL DATA:  Short of breath EXAM: CHEST - 2 VIEW COMPARISON:  06/30/2019 FINDINGS: Postop CABG. Interval development of diffuse bilateral airspace disease with the appearance of edema. Interval development of small pleural effusion. Mild atelectasis in the bases. Severe lumbar compression fracture of L1 appears chronic and unchanged from prior studies. IMPRESSION: Diffuse bilateral airspace disease with small effusion consistent with congestive heart failure and edema. Electronically Signed   By: Franchot Gallo M.D.   On: 07/18/2019 11:15    Radiological Exams on Admission: Dg Chest 2 View  Result Date: 07/18/2019 CLINICAL DATA:  Short of breath EXAM: CHEST - 2 VIEW COMPARISON:  06/30/2019 FINDINGS: Postop CABG. Interval development of diffuse bilateral airspace disease with the appearance of edema. Interval development of small  pleural effusion. Mild atelectasis in the bases. Severe lumbar compression fracture of L1 appears chronic and unchanged from prior studies. IMPRESSION: Diffuse bilateral airspace disease with small effusion consistent with congestive heart failure and edema. Electronically Signed   By: Franchot Gallo M.D.   On:  07/18/2019 11:15    DVT Prophylaxis -SCD /heparin  AM Labs Ordered, also please review Full Orders  Family Communication: Admission, patients condition and plan of care including tests being ordered have been discussed with the patient and daughter [name]* who indicate understanding and agree with the plan   Code Status - Full Code  Likely DC to  home  Condition   stable Roxan Hockey M.D on 07/18/2019 at 4:43 PM Go to [email or web address] -  for contact info  [facility] - Office  [phone]

## 2019-07-18 NOTE — ED Triage Notes (Signed)
Patient complaining of abdominal pain since yesterday. Also complaining of shortness of breath starting at 0230 this morning.

## 2019-07-18 NOTE — ED Provider Notes (Signed)
[facility] EMERGENCY DEPARTMENT Provider Note   CSN: [id number] Arrival date & time: 07/18/19  6948    History   Chief Complaint Chief Complaint  Patient presents with   Abdominal Pain    HPI [name] is a 85 y.o. female.     85 y.o female with an extensive PMH including CKD,CHF, Asthma presents with a chief complaint of abdominal and shortness of breath since last night.  History was mainly provided by daughter at the bedside due to patient's mental status.  She reports patient this morning appear to be short of breath, holding onto her stomach, bending over on her chair.  Patient does report she feels a left-sided not which is painful to touch along the left lower quadrant.  According to daughter patient had been constipated but had a large bowel movement yesterday after 2 doses of milk of magnesia.  Daughter reports patient was recently hospitalized and had changes in medications, she was taken off for medications, she is unsure which ones these are but knows that 1 of them was methenamine 1 g which was prescribed by Dr. Cletus Gash and states this medication was really helping patient.  Patient does report a sensation of urgency, reports after urination she does not feel like her bladder has fully empty.  Denies any fever, vomiting, dizziness, chest pain. Of note patient is currently not on any supplemental oxygen at home, however, was placed on 2 L of oxygen while in the ED to help with her symptoms.  The history is provided by the patient and medical records.  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, speech issues, balance issues, hearing loss, swallowing issues   Dementia ([place])    "a little"   Depression    Diabetes mellitus    type 2   GERD (gastroesophageal reflux disease)    Hypertension    Iron deficiency anemia 05/27/2011   Myocardial infarction [facility])    Renal insufficiency    Scoliosis    Vertigo    "when sugar gets low"   Vision loss    left eye-"half blind"    Patient Active Problem List   Diagnosis Date Noted   Nocturnal enuresis 07/03/2019   Mixed stress and urge urinary incontinence 07/03/2019   Generalized weakness 06/28/2019   Symptomatic bradycardia 06/28/2019   Apnea 02/15/2019   Snoring 02/15/2019   Polyp of colon    Accelerated hypertension 01/20/2019   Rectal bleeding 11/08/2018   Rectal pain 11/08/2018   Abnormal CT scan, colon 11/08/2018   (HFpEF) heart failure with preserved ejection fraction ([place]) 10/09/2018   Acute on chronic diastolic CHF (congestive heart failure) ([place]) 10/09/2018   Aortic stenosis, moderate 10/09/2018   Mitral valve stenosis 10/09/2018   Acute exacerbation of CHF (congestive heart failure) ([place]) 10/07/2018   Acute  hypoxemic respiratory failure ([place]) 10/07/2018   Chronic anemia 10/07/2018   Asthma 10/07/2018   CVA (cerebral vascular accident) ([place]) 10/07/2018   CAD (coronary artery disease) 10/07/2018   Constipation 10/07/2018   Physical deconditioning 10/07/2018   History of recurrent UTIs 10/07/2018   CKD (chronic kidney disease) stage 3, GFR 30-59 ml/min (HCC) 12/22/2017   Dyslipidemia 12/22/2017   Weakness 11/23/2017   Overweight (BMI 25.0-29.9) 05/08/2016   UTI (lower urinary tract infection) 11/12/2015   DM hyperosmolarity type II, uncontrolled ([place]) 11/12/2015   Dementia ([place])    Encephalopathy,  metabolic 11/57/2620   S/P right TKA 08/13/2015   S/P knee replacement 08/13/2015   Elevated troponin 03/06/2015   Fever    History of diabetes mellitus    Left buttock pain    Mixed hyperlipidemia 01/11/2015   GAD (generalized anxiety disorder) 01/11/2015   Depression 01/11/2015   GERD (gastroesophageal reflux disease) 01/11/2015   Osteopenia 07/13/2014   Obesity (BMI 30-39.9) 06/19/2014   Carotid stenosis    Anemia of chronic disease 12/25/2011   Iron deficiency anemia 35/59/7416   Diastolic CHF, chronic ([place]) 04/02/2010   Type 2 diabetes mellitus with insulin therapy ([place]) 10/08/2009   HLD (hyperlipidemia) 10/08/2009   HTN (hypertension) 10/08/2009   Coronary atherosclerosis 10/08/2009   CAROTID STENOSIS 10/08/2009    Past Surgical History:  Procedure Laterality Date   APPENDECTOMY     with hysterectomy   CATARACT EXTRACTION Bilateral 5 years ago   CHOLECYSTECTOMY     COLONOSCOPY  2005   Dr. Amedeo Plenty: Diverticulosis   COLONOSCOPY WITH PROPOFOL N/A 02/14/2019   Procedure: COLONOSCOPY WITH PROPOFOL;  Surgeon: Danie Binder, MD;  Location: [facility] ENDO [facility];  Service: Endoscopy;  Laterality: N/A;   CORONARY ANGIOPLASTY     prior to 2006 5 stents   CORONARY ARTERY BYPASS GRAFT  2006   [facility] [place]  April 2013   KNEE SURGERY Right    POLYPECTOMY  02/14/2019   Procedure: POLYPECTOMY;  Surgeon: Danie Binder, MD;  Location: [facility] ENDO [facility];  Service: Endoscopy;;  cecal polyp   TOTAL ABDOMINAL HYSTERECTOMY  ~85 years old   complete, with tumor removal   TOTAL KNEE ARTHROPLASTY Right 08/13/2015   Procedure: RIGHT  TOTAL KNEE ARTHROPLASTY;  Surgeon: Paralee Cancel, MD;  Location: [facility];  Service: Orthopedics;  Laterality: Right;     OB History    Gravida  4   Para  4   Term  4   Preterm      AB      Living  3     SAB      TAB      Ectopic      Multiple      Live Births               Home Medications    Prior to  Admission medications   Medication Sig Start Date End Date Taking? Authorizing Provider  albuterol (PROVENTIL HFA;VENTOLIN HFA) 108 (90 Base) MCG/ACT inhaler Inhale 2 puffs into the lungs every 6 (six) hours as needed for wheezing or shortness of breath. 01/20/19   [name], MD  amLODipine (NORVASC) 10 MG tablet TAKE 1 TABLET ONCE DAILY. 07/06/19   [name], MD  aspirin 81 MG chewable tablet Chew 81 mg by mouth at bedtime. 08/04/10   [provider]  atorvastatin (LIPITOR) 20 MG tablet Take 1 tablet (20 mg total) by mouth at bedtime. 10/18/18   [name], MD  clopidogrel (PLAVIX) 75 MG tablet TAKE 1 TABLET DAILY. Patient taking differently: Take 75 mg by mouth daily.  02/03/19   [name], MD  esomeprazole (NEXIUM) 40 MG capsule TAKE 1 CAPSULE DAILY. 07/06/19   [name], MD  ferrous sulfate 324 (65 Fe) MG TBEC Take 324 mg by mouth daily.     [provider]  furosemide (LASIX) 20 MG tablet Take 1 tablet (20 mg total) by mouth every morning. 07/02/19   Domenic Polite, MD  hydrALAZINE (APRESOLINE) 50 MG tablet TAKE 1 TABLET BY MOUTH 3 TIMES DAILY. 07/06/19   [name], MD  insulin lispro (HUMALOG KWIKPEN) 100 UNIT/ML KiwkPen Inject 0.04-0.1 mLs (4-10 Units total) into the skin daily as needed (high blood sugar). Patient taking differently: Inject 5-15 Units into the skin daily as needed (for high blood sugar levels over 250).  07/12/18   [name], MD  LEVEMIR FLEXTOUCH 100 UNIT/ML Pen INJECT [place] MORNING Patient taking differently: Inject 24 Units into the skin every morning.  06/19/19   [name], MD  metFORMIN (GLUCOPHAGE-XR) 500 MG 24 hr tablet TAKE 1 TABLET 2 TIMES A DAY WITH BREAKFAST & DINNER. 07/06/19   [name], MD  Multiple Vitamins-Iron (DAILY VITAMINS/IRON/BETA CAROT PO) Take 1 tablet by mouth at bedtime.    [provider]  nabumetone (RELAFEN) 500 MG tablet Take 1 tablet (500 mg total) by mouth 2 (two) times  daily. For muscle and joint pain Patient taking differently: Take 500 mg by mouth 2 (two) times daily as needed for mild pain or moderate pain. For muscle and joint pain 04/20/19   [name], MD  nitroGLYCERIN (NITROSTAT) 0.4 MG SL tablet PLACE (1) TABLET UNDER TONGUE EVERY 5 MINUTES UP TO (3) DOSES. IF NO RELIEF CALL 911. 07/06/19   [name], MD  omeprazole (PRILOSEC) 20 MG capsule 1 PO 30 MINS PRIOR TO BREAKFAST. Patient taking differently: Take 20 mg by mouth daily before breakfast.  02/14/19   Fields, Marga Melnick, MD  PARoxetine (PAXIL) 20 MG tablet TAKE 1 TABLET BY MOUTH EVERY MORNING. 07/06/19   [name], MD  polyethylene glycol [facility]) packet Take 17 g by mouth daily. Patient taking differently: Take 17 g by mouth daily as needed for mild constipation or moderate constipation.  07/25/18   Maudie Flakes, MD    Family History Family History  Problem Relation Age of Onset   Cancer Brother        porstate   Early death Sister    Heart disease Brother    Heart disease Brother    Heart attack Brother    Heart disease Brother    Heart attack Brother    Diabetes Sister    Heart attack Sister    Diabetes Sister    Osteoporosis Sister    Hypertension Sister    Heart attack Son    Early death Son    Pancreatitis Son    Heart attack Daughter 61    Social History Social History   Tobacco Use   Smoking status: Never Smoker   Smokeless tobacco: Never Used  Substance Use Topics   Alcohol use: No   Drug use: No     Allergies   Advil [ibuprofen], Heparin, and Propoxyphene n-acetaminophen   Review of Systems Review of Systems  Constitutional: Negative for chills and fever.  HENT: Negative for ear pain and sore throat.   Eyes: Negative for pain and visual disturbance.  Respiratory: Positive for shortness of breath. Negative  for cough.   Cardiovascular: Negative for chest pain and palpitations.  Gastrointestinal: Positive for abdominal pain.  Negative for nausea and vomiting.  Genitourinary: Positive for urgency. Negative for dysuria and hematuria.  Musculoskeletal: Negative for arthralgias and back pain.  Skin: Negative for color change and rash.  Neurological: Negative for seizures and syncope.  All other systems reviewed and are negative.    Physical Exam Updated Vital Signs BP (!) 165/96    Pulse (!) 103    Temp 97.6 F (36.4 C) (Oral)    Resp (!) 24    Ht 5\' 6"  (1.676 m)    Wt 73.9 kg    SpO2 94%    BMI 26.31 kg/m   Physical Exam Vitals signs and nursing note reviewed.  Constitutional:      General: She is not in acute distress.    Appearance: She is well-developed.     Comments: Chronically ill-appearing.  HENT:     Head: Normocephalic and atraumatic.     Mouth/Throat:     Pharynx: No oropharyngeal exudate.  Eyes:     Pupils: Pupils are equal, round, and reactive to light.  Neck:     Musculoskeletal: Normal range of motion.  Cardiovascular:     Rate and Rhythm: Normal rate. Rhythm irregular.     Heart sounds: Normal heart sounds.     Comments: No bilateral leg edema noted on exam. Pulmonary:     Effort: Pulmonary effort is normal. No respiratory distress.     Breath sounds: Normal breath sounds.     Comments: Lung sounds are diminished, no wheezing, rhonchi, rales. Abdominal:     General: A surgical scar is present. Bowel sounds are normal. There is no distension.     Palpations: Abdomen is soft.     Tenderness: There is abdominal tenderness in the right lower quadrant, suprapubic area and left lower quadrant. There is no right CVA tenderness, left CVA tenderness, guarding or rebound. Negative signs include Murphy's sign.     Hernia: No hernia is present.     Comments: No guarding noting on exam, significant tenderness along the left lower quadrant more so suprapubically as well.  Musculoskeletal:        General: No tenderness or deformity.     Right lower leg: No edema.     Left lower leg: No edema.    Skin:    General: Skin is warm and dry.  Neurological:     Mental Status: She is alert and oriented to person, place, and time.      ED Treatments / Results  Labs (all labs ordered are listed, but only abnormal results are displayed) Labs Reviewed  COMPREHENSIVE METABOLIC PANEL - Abnormal; Notable for the following components:      Result Value   Potassium 5.2 (*)    Glucose, Bld 171 (*)    BUN 37 (*)    Creatinine, Ser 1.75 (*)    GFR calc non Af Amer 26 (*)    GFR calc Af Amer 30 (*)    All other components within normal limits  CBC WITH DIFFERENTIAL/PLATELET - Abnormal; Notable for the following components:   RBC 3.18 (*)    Hemoglobin 9.2 (*)    HCT 30.2 (*)    Lymphs Abs 0.6 (*)    All other components within normal limits  BRAIN NATRIURETIC PEPTIDE - Abnormal; Notable for the following components:   B Natriuretic Peptide 283.0 (*)    All other components within normal  limits  SARS CORONAVIRUS 2 (HOSPITAL ORDER, [facility])  LIPASE, BLOOD  URINALYSIS, ROUTINE W REFLEX MICROSCOPIC    EKG EKG Interpretation  Date/Time:  Tuesday July 18 2019 09:38:37 EDT Ventricular Rate:  115 PR Interval:    QRS Duration: 148 QT Interval:  395 QTC Calculation: 937 R Axis:   88 Text Interpretation:  Atrial fibrillation Ventricular premature complex Right bundle branch block Confirmed by Elnora Morrison [phone]) on 07/18/2019 10:00:22 AM   Radiology Dg Chest 2 View  Result Date: 07/18/2019 CLINICAL DATA:  Short of breath EXAM: CHEST - 2 VIEW COMPARISON:  06/30/2019 FINDINGS: Postop CABG. Interval development of diffuse bilateral airspace disease with the appearance of edema. Interval development of small pleural effusion. Mild atelectasis in the bases. Severe lumbar compression fracture of L1 appears chronic and unchanged from prior studies. IMPRESSION: Diffuse bilateral airspace disease with small effusion consistent with congestive heart failure and  edema. Electronically Signed   By: Franchot Gallo M.D.   On: 07/18/2019 11:15    Procedures Procedures (including critical care time)  Medications Ordered in ED Medications  furosemide (LASIX) injection 40 mg (has no administration in time range)     Initial Impression / Assessment and Plan / ED Course  I have reviewed the triage vital signs and the nursing notes.  Pertinent labs & imaging results that were available during my care of the patient were reviewed by me and considered in my medical decision making (see chart for details).    Patient with an extensive medical history including CHF, CAD, HTN presents to the ED with worsening shortness of breath along with abdominal pain which began this morning around 2 AM.  Patient was recently hospitalized on June 28, 2019, had medication changes along with was taken off for medications 1 of these which included her methenamine which she was taking for her bladder infection, according to daughter her urinary symptoms have returned.  During primary evaluation patient appears chronically ill, reports pain along the left lower quadrant more so than not sensation.  Reports urinary symptoms along with shortness of breath.  Placed on 2 L of O2 on arrival due to oxygen desaturation.  Heart rates elevated and EKG was consistent with new onset A. fib with RVR.   This patients CHA2DS2-VASc Score and unadjusted Ischemic Stroke Rate 6 (% per year) is equal to 9.7 % stroke rate/year from a score of 6  Above score calculated as 1 point each if present [CHF, HTN, DM, Vascular=MI/PAD/Aortic Plaque, Age if 65-74, or Female] Above score calculated as 2 points each if present [Age > 75, or Stroke/TIA/TE]  CBC showed no leukocytosis, hemoglobin slightly decreased but consistent with her previous baseline.  CMP showed slightly elevated potassium of 5.2, creatinine slightly elevated at 1.75 increased from her previous visits.  LFTs are within normal limits.  Lipase  is within normal limits.  BNP was slightly elevated but improved from previous visits.  According to the previous visit and previous admission on June 28, 2019, patient was admitted for bradycardia, they were attributing this likely to her metoprolol ingestion.  Today she arrived with slight elevated heart rate at 103, daughter reports she has not taken any of her medications this morning as she has not had any breakfast.  Patient does have tachypnea on exam, increased work of breathing was placed on 2 L of oxygen which she is not currently on any oxygen at baseline.  Cannot exclude pulmonary embolism, however, due to patient's  creatinine level unable to obtain CTA to rule out PE, will likely need a VQ scan performed.  Patient will likely need admission into the hospital to restore her condition.  Will place call for hospitalist admission.  Daughter was informed of results, management and care plan. 1:00 PM spoke to hospitalist service, appreciate their help.  Patient to be admitted for further management of shortness of breath.  Portions of this note were generated with [employer]obby[employer]. Dictation errors may occur despite best attempts at proofreading.  Final Clinical Impressions(s) / ED Diagnoses   Final diagnoses:  Atrial fibrillation with RVR (HCC)  Shortness of breath  Hyperkalemia    ED Discharge Orders    None       Janeece Fitting, PA-C 07/18/19 1300    Elnora Morrison, MD 07/18/19 1609

## 2019-07-19 DIAGNOSIS — Z951 Presence of aortocoronary bypass graft: Secondary | ICD-10-CM | POA: Diagnosis not present

## 2019-07-19 DIAGNOSIS — D638 Anemia in other chronic diseases classified elsewhere: Secondary | ICD-10-CM

## 2019-07-19 DIAGNOSIS — Z888 Allergy status to other drugs, medicaments and biological substances status: Secondary | ICD-10-CM | POA: Diagnosis not present

## 2019-07-19 DIAGNOSIS — I509 Heart failure, unspecified: Secondary | ICD-10-CM | POA: Diagnosis not present

## 2019-07-19 DIAGNOSIS — I2510 Atherosclerotic heart disease of native coronary artery without angina pectoris: Secondary | ICD-10-CM | POA: Diagnosis present

## 2019-07-19 DIAGNOSIS — I350 Nonrheumatic aortic (valve) stenosis: Secondary | ICD-10-CM

## 2019-07-19 DIAGNOSIS — H5462 Unqualified visual loss, left eye, normal vision right eye: Secondary | ICD-10-CM | POA: Diagnosis present

## 2019-07-19 DIAGNOSIS — I4891 Unspecified atrial fibrillation: Secondary | ICD-10-CM | POA: Diagnosis not present

## 2019-07-19 DIAGNOSIS — I495 Sick sinus syndrome: Secondary | ICD-10-CM | POA: Diagnosis not present

## 2019-07-19 DIAGNOSIS — N390 Urinary tract infection, site not specified: Secondary | ICD-10-CM | POA: Diagnosis present

## 2019-07-19 DIAGNOSIS — E1122 Type 2 diabetes mellitus with diabetic chronic kidney disease: Secondary | ICD-10-CM | POA: Diagnosis present

## 2019-07-19 DIAGNOSIS — I080 Rheumatic disorders of both mitral and aortic valves: Secondary | ICD-10-CM | POA: Diagnosis present

## 2019-07-19 DIAGNOSIS — I5033 Acute on chronic diastolic (congestive) heart failure: Secondary | ICD-10-CM | POA: Diagnosis not present

## 2019-07-19 DIAGNOSIS — I69328 Other speech and language deficits following cerebral infarction: Secondary | ICD-10-CM | POA: Diagnosis not present

## 2019-07-19 DIAGNOSIS — F0390 Unspecified dementia without behavioral disturbance: Secondary | ICD-10-CM | POA: Diagnosis present

## 2019-07-19 DIAGNOSIS — I6529 Occlusion and stenosis of unspecified carotid artery: Secondary | ICD-10-CM | POA: Diagnosis present

## 2019-07-19 DIAGNOSIS — Z794 Long term (current) use of insulin: Secondary | ICD-10-CM | POA: Diagnosis not present

## 2019-07-19 DIAGNOSIS — D631 Anemia in chronic kidney disease: Secondary | ICD-10-CM | POA: Diagnosis present

## 2019-07-19 DIAGNOSIS — N179 Acute kidney failure, unspecified: Secondary | ICD-10-CM | POA: Diagnosis not present

## 2019-07-19 DIAGNOSIS — H9190 Unspecified hearing loss, unspecified ear: Secondary | ICD-10-CM | POA: Diagnosis present

## 2019-07-19 DIAGNOSIS — Z7902 Long term (current) use of antithrombotics/antiplatelets: Secondary | ICD-10-CM | POA: Diagnosis not present

## 2019-07-19 DIAGNOSIS — N183 Chronic kidney disease, stage 3 (moderate): Secondary | ICD-10-CM | POA: Diagnosis present

## 2019-07-19 DIAGNOSIS — I25708 Atherosclerosis of coronary artery bypass graft(s), unspecified, with other forms of angina pectoris: Secondary | ICD-10-CM | POA: Diagnosis not present

## 2019-07-19 DIAGNOSIS — F419 Anxiety disorder, unspecified: Secondary | ICD-10-CM | POA: Diagnosis present

## 2019-07-19 DIAGNOSIS — Z20828 Contact with and (suspected) exposure to other viral communicable diseases: Secondary | ICD-10-CM | POA: Diagnosis present

## 2019-07-19 DIAGNOSIS — I050 Rheumatic mitral stenosis: Secondary | ICD-10-CM

## 2019-07-19 DIAGNOSIS — Z955 Presence of coronary angioplasty implant and graft: Secondary | ICD-10-CM | POA: Diagnosis not present

## 2019-07-19 DIAGNOSIS — K219 Gastro-esophageal reflux disease without esophagitis: Secondary | ICD-10-CM | POA: Diagnosis present

## 2019-07-19 DIAGNOSIS — E1165 Type 2 diabetes mellitus with hyperglycemia: Secondary | ICD-10-CM | POA: Diagnosis present

## 2019-07-19 DIAGNOSIS — I10 Essential (primary) hypertension: Secondary | ICD-10-CM | POA: Diagnosis not present

## 2019-07-19 DIAGNOSIS — I38 Endocarditis, valve unspecified: Secondary | ICD-10-CM

## 2019-07-19 DIAGNOSIS — R0602 Shortness of breath: Secondary | ICD-10-CM | POA: Diagnosis present

## 2019-07-19 DIAGNOSIS — I130 Hypertensive heart and chronic kidney disease with heart failure and stage 1 through stage 4 chronic kidney disease, or unspecified chronic kidney disease: Secondary | ICD-10-CM | POA: Diagnosis not present

## 2019-07-19 LAB — BASIC METABOLIC PANEL
Anion gap: 7 (ref 5–15)
BUN: 38 mg/dL — ABNORMAL HIGH (ref 8–23)
CO2: 28 mmol/L (ref 22–32)
Calcium: 9.3 mg/dL (ref 8.9–10.3)
Chloride: 103 mmol/L (ref 98–111)
Creatinine, Ser: 1.64 mg/dL — ABNORMAL HIGH (ref 0.44–1.00)
GFR calc Af Amer: 33 mL/min — ABNORMAL LOW (ref >60)
GFR calc non Af Amer: 28 mL/min — ABNORMAL LOW (ref >60)
Glucose, Bld: 132 mg/dL — ABNORMAL HIGH (ref 70–99)
Potassium: 4.8 mmol/L (ref 3.5–5.1)
Sodium: 138 mmol/L (ref 135–145)

## 2019-07-19 LAB — CBC
HCT: 29.6 % — ABNORMAL LOW (ref 36.0–46.0)
Hemoglobin: 9.1 g/dL — ABNORMAL LOW (ref 12.0–15.0)
MCH: 29.5 pg (ref 26.0–34.0)
MCHC: 30.7 g/dL (ref 30.0–36.0)
MCV: 96.1 fL (ref 80.0–100.0)
Platelets: 200 10*3/uL (ref 150–400)
RBC: 3.08 MIL/uL — ABNORMAL LOW (ref 3.87–5.11)
RDW: 14.1 % (ref 11.5–15.5)
WBC: 5.1 10*3/uL (ref 4.0–10.5)
nRBC: 0 % (ref 0.0–0.2)

## 2019-07-19 LAB — GLUCOSE, CAPILLARY
Glucose-Capillary: 200 mg/dL — ABNORMAL HIGH (ref 70–99)
Glucose-Capillary: 205 mg/dL — ABNORMAL HIGH (ref 70–99)
Glucose-Capillary: 157 mg/dL — ABNORMAL HIGH (ref 70–99)
Glucose-Capillary: 113 mg/dL — ABNORMAL HIGH (ref 70–99)

## 2019-07-19 MED ORDER — SODIUM CHLORIDE 0.9 % IV SOLN
1.0000 g | INTRAVENOUS | Status: AC
  Administered 2019-07-19 – 2019-07-21 (×3): 1 g via INTRAVENOUS
  Filled 2019-07-19 (×3): qty 10

## 2019-07-19 MED ORDER — METOPROLOL TARTRATE 25 MG PO TABS
12.5000 mg | ORAL_TABLET | Freq: Two times a day (BID) | ORAL | Status: AC
  Administered 2019-07-19 – 2019-07-20 (×4): 12.5 mg via ORAL
  Filled 2019-07-19 (×4): qty 1

## 2019-07-19 NOTE — Consult Note (Addendum)
Cardiology Consult    [name]; [id number]; [birth year]   Admit date: 07/18/2019 Date of Consult: 07/19/2019  Primary Care Provider: [name], MD Primary Cardiologist: Morgan Breeding, MD   Patient Profile    [name] is a 85 y.o. female with past medical history of CAD (s/p CABG in 2006), carotid artery stenosis, aortic stenosis HTN, HLD, Type 2 DM, Stage 3 CKD, and prior CVA who is being seen today for the evaluation of atrial fibrillation with RVR at the request of Dr. Denton Figueroa.   History of Present Illness    [name] was most recently admitted to [facility] from 7/22 - 07/02/2019 for evaluation of worsening weakness and falls, found to be bradycardiac with HR in the 30's to 40's upon arrival and most consistent with junctional bradycardia. Initial and delta HS Troponin values were elevated at 736 and 657 with EKG showing a new RBBB. She had been on Lopressor 50mg  BID and this was held at the time of admission. Echocardiogram showed a preserved EF of 60-65% with no regional WMA. LA was severely dilated and she had moderate mitral stenosis, mild to moderate MR along with moderate AS. Given her elevated cardiac enzymes, a NST was obtained and showed a no significant reversible ischemia and was overall a low-risk study. By review of notes, she maintained sinus rhythm during admission with rates improving into the 60's and 70's. Was discharged home on 07/02/2019 with Imdur, Lopressor, and Aldactone being discontinued. Was continued on ASA, Plavix, Atorvastatin, Lasix 20mg  daily, Hydralazine 50mg  TID, and Amlodipine 10mg  daily,   She presented back to [facility] ED on 07/18/2019 for evaluation of dyspnea and abdominal discomfort. In talking with the patient today, she is a difficult historian but reports worsening dyspnea leading up to admission. Also notes chronic constipation but says she had a bowel movement two days ago. No evidence of melena or hematochezia. She  denies any recent chest pain or palpitations. No recent orthopnea or PND. Unsure what her weight has been on her home scales. She denies any specific edema but does report chronic pain along her legs bilaterally which has been occurring for years.   Initial labs show WBC 6.4, Hgb 9.2, platelets 217, Na+ 137, K+ 5.2, and creatinine 1.75 (variable from 1.39 to 1.97 last admission). Lipase 25. BNP 283. COVID negative. UA concerning for UTI. CXR shows diffuse bilateral airspace disease with small effusion consistent with congestive heart failure and edema. Initial EKG showed atrial fibrillation, HR 115, with known RBBB.   By review of telemetry, she has been in atrial fibrillation with rates in the 80's to 110's. Occasional pauses up to 1.7 seconds. She has been started on IV Lasix 40mg  BID with a recorded output of -1.2L thus far. Variable weights recorded as this was listed as 163 lbs on admission and at 151 lbs today (?).    Past Medical History:  Diagnosis Date  . Anemia   . Anxiety   . Aortic stenosis   . Arthritis    back, knees, and hips  . Asthma    with allergies  . Balance problems   . CAD (coronary artery disease)   . Carotid stenosis   . CHF (congestive heart failure) ([place])    EF preserved Echo 2012  . CKD (chronic kidney disease)   . 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 Dementia ([place])    "  a little"  . Depression   . Diabetes mellitus    type 2  . GERD (gastroesophageal reflux disease)   . Hypertension   . Iron deficiency anemia 05/27/2011  . Myocardial infarction ([place])   . Renal insufficiency   . Scoliosis   . Vertigo    "when sugar gets low"  . Vision loss    left eye-"half blind"    Past Surgical History:  Procedure Laterality Date  . APPENDECTOMY     with hysterectomy  . CATARACT EXTRACTION Bilateral 5 years ago  . CHOLECYSTECTOMY    . COLONOSCOPY  2005   Dr. Amedeo Plenty:  Diverticulosis  . COLONOSCOPY WITH PROPOFOL N/A 02/14/2019   Procedure: COLONOSCOPY WITH PROPOFOL;  Surgeon: Morgan Binder, MD;  Location: [facility] END[facility] SUITE;  Service: Endoscopy;  Laterality: N/A;  . CORONARY ANGIOPLASTY     prior to 2006 5 stents  . CORONARY ARTERY BYPASS GRAFT  2006  . I & D of [place]  April 2013  . KNEE SURGERY Right   . POLYPECTOMY  02/14/2019   Procedure: POLYPECTOMY;  Surgeon: Morgan Binder, MD;  Location: [facility] SU[facility];  Service: Endoscopy;;  cecal polyp  . TOTAL ABDOMINAL HYSTERECTOMY  ~85 years old   complete, with tumor removal  . TOTAL KNEE ARTHROPLASTY Right 08/13/2015   Procedure: RIGHT  TOTAL KNEE ARTHROPLASTY;  Surgeon: Morgan Cancel, MD;  Location: [facility] ORS;  Service: Orthopedics;  Laterality: Right;     Home Medications:  Prior to Admission medications   Medication Sig Start Date End Date Taking? Authorizing Provider  albuterol (PROVENTIL HFA;VENTOLIN HFA) 108 (90 Base) MCG/ACT inhaler Inhale 2 puffs into the lungs every 6 (six) hours as needed for wheezing or shortness of breath. 01/20/19  Yes Figueroa, Morgan Gash, MD  amLODipine (NORVASC) 10 MG tablet TAKE 1 TABLET ONCE DAILY. 07/06/19  Yes Morgan Fraise, MD  aspirin 81 MG chewable tablet Chew 81 mg by mouth at bedtime. 08/04/10  Yes [provider]  atorvastatin (LIPITOR) 20 MG tablet Take 1 tablet (20 mg total) by mouth at bedtime. 10/18/18  Yes Figueroa, Morgan Gash, MD  Carboxymethylcellul-Glycerin (CLEAR EYES FOR DRY EYES) 1-0.25 % SOLN Place 1 drop into both eyes daily as needed.   Yes [provider]  clopidogrel (PLAVIX) 75 MG tablet TAKE 1 TABLET DAILY. Patient taking differently: Take 75 mg by mouth daily.  02/03/19  Yes Figueroa, Morgan Gash, MD  esomeprazole (NEXIUM) 40 MG capsule TAKE 1 CAPSULE DAILY. 07/06/19  Yes Morgan Fraise, MD  ferrous sulfate 324 (65 Fe) MG TBEC Take 324 mg by mouth daily.    Yes [provider]  furosemide (LASIX) 20 MG tablet Take 1 tablet (20 mg total) by mouth  every morning. 07/02/19  Yes Morgan Polite, MD  hydrALAZINE (APRESOLINE) 50 MG tablet TAKE 1 TABLET BY MOUTH 3 TIMES DAILY. 07/06/19  Yes Figueroa, Morgan Gash, MD  insulin lispro (HUMALOG KWIKPEN) 100 UNIT/ML KiwkPen Inject 0.04-0.1 mLs (4-10 Units total) into the skin daily as needed (high blood sugar). Patient taking differently: Inject 5-15 Units into the skin daily as needed (for high blood sugar levels over 250).  07/12/18  Yes Figueroa, Morgan Gash, MD  LEVEMIR FLEXTOUCH 100 UNIT/ML Pen INJECT [place] MORNING Patient taking differently: Inject 24 Units into the skin every morning.  06/19/19  Yes Figueroa, Morgan Gash, MD  lisinopril (ZESTRIL) 20 MG tablet Take 20 mg by mouth daily. 07/05/19  Yes [provider]  magnesium hydroxide (MILK OF MAGNESIA) 400 MG/5ML suspension Take 30  mLs by mouth daily as needed for mild constipation.   Yes [provider]  metFORMIN (GLUCOPHAGE-XR) 500 MG 24 hr tablet TAKE 1 TABLET 2 TIMES A DAY WITH BREAKFAST & DINNER. Patient taking differently: Take 500 mg by mouth 2 (two) times daily.  07/06/19  Yes Figueroa, Morgan Gash, MD  Multiple Vitamins-Iron (DAILY VITAMINS/IRON/BETA CAROT PO) Take 1 tablet by mouth at bedtime.   Yes [provider]  nitroGLYCERIN (NITROSTAT) 0.4 MG SL tablet PLACE (1) TABLET UNDER TONGUE EVERY 5 MINUTES UP TO (3) DOSES. IF NO RELIEF CALL 911. 07/06/19  Yes Figueroa, Morgan Gash, MD  PARoxetine (PAXIL) 20 MG tablet TAKE 1 TABLET BY MOUTH EVERY MORNING. 07/06/19  Yes Figueroa, Morgan Gash, MD  polyethylene glycol [facility]) packet Take 17 g by mouth daily. Patient taking differently: Take 17 g by mouth daily as needed for mild constipation or moderate constipation.  07/25/18  Yes Maudie Flakes, MD    Inpatient Medications: Scheduled Meds: . aspirin  81 mg Oral Q breakfast  . atorvastatin  20 mg Oral QHS  . clopidogrel  75 mg Oral Daily  . enoxaparin (LOVENOX) injection  30 mg Subcutaneous Q24H  . ferrous sulfate  324 mg Oral Daily  . furosemide   40 mg Intravenous Q12H  . hydrALAZINE  50 mg Oral TID  . insulin aspart  0-5 Units Subcutaneous QHS  . insulin aspart  0-9 Units Subcutaneous TID WC  . insulin detemir  24 Units Subcutaneous q morning - 10a  . pantoprazole  40 mg Oral Daily  . PARoxetine  20 mg Oral q morning - 10a  . polyethylene glycol  17 g Oral Daily  . sodium chloride flush  3 mL Intravenous Q12H   Continuous Infusions: . sodium chloride     PRN Meds: sodium chloride, acetaminophen **OR** acetaminophen, albuterol, hydrALAZINE, ondansetron **OR** ondansetron (ZOFRAN) IV, polyethylene glycol, sodium chloride flush, traZODone  Allergies:    Allergies  Allergen Reactions  . Advil [Ibuprofen] Swelling  . Heparin Other (See Comments)    Confusion, hallucinations.   . Propoxyphene N-Acetaminophen Other (See Comments)    Numbness all over. "floating" sensation.    Social History:   Social History   Socioeconomic History  . Marital status: Widowed    Spouse name: Not on file  . Number of children: 4  . Years of education: 3  . Highest education level: 3rd grade  Occupational History  . Occupation: retired  Scientific laboratory technician  . Financial resource strain: Not very hard  . Food insecurity    Worry: Never true    Inability: Never true  . Transportation needs    Medical: No    Non-medical: No  Tobacco Use  . Smoking status: Never Smoker  . Smokeless tobacco: Never Used  Substance and Sexual Activity  . Alcohol use: No  . Drug use: No  . Sexual activity: Not Currently  Lifestyle  . Physical activity    Days per week: 0 days    Minutes per session: 0 min  . Stress: To some extent  Relationships  . Social connections    Talks on phone: More than three times a week    Gets together: More than three times a week    Attends religious service: More than 4 times per year    Active member of club or organization: No    Attends meetings of clubs or organizations: Never    Relationship status: Widowed  .  Intimate partner violence    Fear  of current or ex partner: No    Emotionally abused: No    Physically abused: No    Forced sexual activity: No  Other Topics Concern  . Not on file  Social History Narrative  . Not on file     Family History:    Family History  Problem Relation Age of Onset  . Cancer Brother        porstate  . Early death Sister   . Heart disease Brother   . Heart disease Brother   . Heart attack Brother   . Heart disease Brother   . Heart attack Brother   . Diabetes Sister   . Heart attack Sister   . Diabetes Sister   . Osteoporosis Sister   . Hypertension Sister   . Heart attack Son   . Early death Son   . Pancreatitis Son   . Heart attack Daughter 25      Review of Systems    General:  No chills, fever, night sweats or weight changes.  Cardiovascular:  No chest pain,  edema, orthopnea, palpitations, paroxysmal nocturnal dyspnea. Positive for dyspnea on exertion.  Dermatological: No rash, lesions/masses Respiratory: No cough, dyspnea Urologic: No hematuria, dysuria Abdominal:   No nausea, vomiting, diarrhea, bright red blood per rectum, melena, or hematemesis. Positive for abdominal pain.  Neurologic:  No visual changes, wkns, changes in mental status. All other systems reviewed and are otherwise negative except as noted above.  Physical Exam/Data    Vitals:   07/18/19 2030 07/18/19 2119 07/19/19 0500 07/19/19 0613  BP:  (!) 138/94  126/72  Pulse:  94  95  Resp:  20  20  Temp:  98.5 F (36.9 C)  98.2 F (36.8 C)  TempSrc:  Oral  Oral  SpO2: 94% 96%  95%  Weight:   68.6 kg   Height:        Intake/Output Summary (Last 24 hours) at 07/19/2019 0857 Last data filed at 07/19/2019 0719 Gross per 24 hour  Intake -  Output 1400 ml  Net -1400 ml   Filed Weights   07/18/19 0938 07/19/19 0500  Weight: 73.9 kg 68.6 kg   Body mass index is 24.41 kg/m.   General: Pleasant elderly Caucasian female appearing in NAD Psych: Normal affect.  Neuro: Alert and oriented X 3. Moves all extremities spontaneously. HEENT: Normal  Neck: Supple without bruits or JVD. Lungs:  Resp regular and unlabored, rales along bases bilaterally. Heart: Irregularly irregular. No s3, s4. 2/6 SEM along RUSB.  Abdomen: Soft, non-tender, non-distended, BS + x 4.  Extremities: No clubbing, cyanosis or edema. DP/PT/Radials 2+ and equal bilaterally.   EKG:  The EKG was personally reviewed and demonstrates: Atrial fibrillation, HR 115, with known RBBB.    Telemetry:  Telemetry was personally reviewed and demonstrates: Atrial fibrillation, HR in 80's to 110's. Occasional pauses with the longest being 1.7 seconds.    Labs/Studies     Relevant CV Studies:  Echocardiogram: 06/29/2019 IMPRESSIONS   1. The left ventricle has normal systolic function with an ejection fraction of 60-65%. The cavity size was normal. There is moderate concentric left ventricular hypertrophy. Left ventricular diastolic Doppler parameters are consistent with  pseudonormalization. Elevated mean left atrial pressure.  2. The right ventricle has normal systolic function. The cavity was normal. There is no increase in right ventricular wall thickness. Right ventricular systolic pressure is mildly elevated with an estimated pressure of 36.6 mmHg.  3. Left atrial size was severely dilated.  4. The mitral valve is degenerative. Mild thickening of the mitral valve leaflet. Mild calcification of the mitral valve leaflet. There is severe mitral annular calcification present. Mitral valve regurgitation is mild to moderate by color flow Doppler.  Moderate mitral valve stenosis.  5. The aortic valve is tricuspid. Moderate thickening of the aortic valve. Moderate calcification of the aortic valve. Moderate stenosis of the aortic valve.  6. The aorta is normal in size and structure.  7. The inferior vena cava was dilated in size with >50% respiratory variability.  8. When compared to the  prior study: 10/08/2018, there is very slight worsening of the aortic stenosis. Mitral valve gradients appear lower due to bradycardia.  NST: 06/30/2019  There was no ST segment deviation noted during stress.  Defect 1: There is a small defect of mild severity.  This is a low risk study.  Nuclear stress EF: 57%.   No significant reversible ischemia. LVEF 57% with normal wall motion. This is a low risk study.  Laboratory Data:  Chemistry Recent Labs  Lab 07/18/19 1004 07/19/19 0434  NA 137 138  K 5.2* 4.8  CL 103 103  CO2 24 28  GLUCOSE 171* 132*  BUN 37* 38*  CREATININE 1.75* 1.64*  CALCIUM 9.3 9.3  GFRNONAA 26* 28*  GFRAA 30* 33*  ANIONGAP 10 7    Recent Labs  Lab 07/18/19 1004  PROT 6.7  ALBUMIN 3.5  AST 17  ALT 17  ALKPHOS 41  BILITOT 0.7   Hematology Recent Labs  Lab 07/18/19 1004 07/19/19 0434  WBC 6.4 5.1  RBC 3.18* 3.08*  HGB 9.2* 9.1*  HCT 30.2* 29.6*  MCV 95.0 96.1  MCH 28.9 29.5  MCHC 30.5 30.7  RDW 14.3 14.1  PLT 217 200   Cardiac EnzymesNo results for input(s): TROPONINI in the last 168 hours. No results for input(s): TROPIPOC in the last 168 hours.  BNP Recent Labs  Lab 07/18/19 1004  BNP 283.0*    DDimer No results for input(s): DDIMER in the last 168 hours.  Radiology/Studies:  Dg Chest 2 View  Result Date: 07/18/2019 CLINICAL DATA:  Short of breath EXAM: CHEST - 2 VIEW COMPARISON:  06/30/2019 FINDINGS: Postop CABG. Interval development of diffuse bilateral airspace disease with the appearance of edema. Interval development of small pleural effusion. Mild atelectasis in the bases. Severe lumbar compression fracture of L1 appears chronic and unchanged from prior studies. IMPRESSION: Diffuse bilateral airspace disease with small effusion consistent with congestive heart failure and edema. Electronically Signed   By: Franchot Gallo M.D.   On: 07/18/2019 11:15     Assessment & Plan    1. New-Onset Atrial Fibrillation - this is a  new diagnosis for the patient and she presented with worsening dyspnea. No specific chest pain or palpitations. Was admitted last month for bradycardia and PTA Lopressor 50mg  BID was discontinued.  - rates were initially in the 110's upon admission and have been in the 80's to 110's overnight and this AM. Will try restarting Lopressor at a lower dose of 12.5mg  BID. Follow rates on telemetry. If noted to have significant pauses, would need to consider EP evaluation for tachy-brady syndrome but will see how she responds to low-dose BB therapy initially.  - This patients CHA2DS2-VASc Score and unadjusted Ischemic Stroke Rate (% per year) is equal to 10.8 % stroke rate/year from a score of 8 (HTN, DM, Vascular, Female, Age (2), CVA (2)). Ideally, given her elevated score she should be on  anticoagulation but by review of notes and discussion with the admitting team she has chronic anemia and recent colonoscopy earlier this year showed diverticulosis and internal hemorrhoids. Not an ideal NOAC candidate given her valvular heart disease and moderate mitral stenosis. Reviewed risks and benefits with the patient and she defers decisions regarding this to her daughter. She is on ASA and Plavix due to history of CVA's and if anticoagulation were to be initiated, would likely stop Plavix.   2. Acute on Chronic Diastolic CHF - likely secondary to atrial fibrillation with RVR. BNP 283. CXR shows diffuse bilateral airspace disease with small effusion consistent with congestive heart failure and edema. - she has been started on IV Lasix 40mg  BID with a recorded output of -1.2L thus far and creatinien has improved from 1.75 on admission to 1.64 today. Continue with diuresis and repeat BMET in AM.   3. CAD - s/p CABG in 2006 with recent NST last admission showing no significant reversible ischemia and was overall a low-risk study. - continue PTA ASA, Plavix, and statin therapy. Presumably on Plavix at this time from a  Neurology perspective given multiple CVA's.   4. Valvular Heart Disease - recent echo last month showed moderate mitral stenosis, mild to moderate mitral regurgitation and moderate AS. Will continue to follow with routine echocardiograms as an outpatient.   5. HTN - BP initially elevated but improved to 137/82 on most recent check.  - continue PTA Hydralazine. Amlodipine held on admission but can restart if needed pending follow-up BP readings.   6. Stage 3 CKD - baseline creatinine 1.3 - 1.4. At 1.75 on admission and improved to 1.64 today.   7. UTI - UA on admission concerning for a UTI. She has been started on Rocephin.    For questions or updates, please contact [place] Please consult [email or web address] for contact info under Cardiology/STEMI.  Signed, Erma Heritage, PA-C 07/19/2019, 8:57 AM Pager: [phone]  The patient was seen and examined, and I agree with the history, physical exam, assessment and plan as documented above, with modifications as noted below. I have also personally reviewed all relevant documentation, old records, labs, and both radiographic and cardiovascular studies. I have also independently interpreted old and new ECG's.  Briefly, this is an 85 year old woman who lives with her oldest daughter.  She has a history of coronary artery disease with CABG in 2006, aortic and mitral stenosis, and prior CVA.  She was recently hospitalized in late July at [facility] for weakness and falls and found to be significantly bradycardic with a heart rate in the 30-40 bpm range.  She had been on Lopressor 50 mg twice daily which was discontinued.  She was also found to have a right bundle branch block.  She underwent a low risk nuclear stress test.  She presented to [facility] yesterday for shortness of breath and abdominal discomfort.  I was unable to obtain a very good history so much of what I ascertain was from the electronic medical record.  She was  found to be in rapid atrial fibrillation (new diagnosis) with initial ECG demonstrating a heart rate of 115 bpm.  Heart rates are currently in the 90-low 100 bpm range.  Chest x-ray showed mild CHF.  She has been started on IV Lasix 40 mg twice daily and has 1.16 L of output thus far.  Creatinine down to 1.64 from 1.75 on admission.  It had been 1.39 on 07/01/2019.  I agree with a  trial of low-dose Lopressor at 12.5 mg twice daily and to observe for any significant pauses or bradycardia.  If this were to recur, she would need an evaluation for a pacemaker for tachycardia-bradycardia syndrome.  With respect to anticoagulation, she has had an elevated thromboembolic risk.  However she has a history of chronic anemia (hemoglobin 9.1 today) and relatively recent colonoscopy showed diverticulosis and internal hemorrhoids.  This will need to be discussed further with her daughter.  She is currently on aspirin Plavix for history of CVA.  If she were to be placed on warfarin (preferred), Plavix should be discontinued and she can remain on aspirin.  Kate Sable, MD, [facility]  07/19/2019 11:07 AM

## 2019-07-19 NOTE — Progress Notes (Signed)
Patient Demographics:    [name], is a 85 y.o. female, DOB - February 01, 1931, [name]  Admit date - 07/18/2019   Admitting Physician Gissela Bloch Denton Brick, MD  Outpatient Primary MD for the patient is [name], MD  LOS - 0   Chief Complaint  Patient presents with  . Abdominal Pain        Subjective:    [name] today has no fevers, no emesis,  No chest pain, less short of breath, no flank pain,.... Had some dyspnea on exertion  Assessment  & Plan :    Principal Problem:   New onset a-fib ([place]) Active Problems:   (HFpEF) heart failure with preserved ejection fraction (HCC)   Type 2 diabetes mellitus with insulin therapy (HCC)   HTN (hypertension)   Anemia of chronic disease   CAD (coronary artery disease)  Brief Summary:- 85 y.o. female with PMHx relevant for  CAD s/p CABG (2006), carotic artery stenosis, HTN with hx of hypertensive urgency, CKD stage III, DM, dyslipidemia, GERD, and chronic diastolic heart failure, prior CVA with residual slurred speech,  aortic stenosis and Mitral stenosis, CKD 3, asthma, hypertension and diabetes recent history of bradycardia with junctional bradycardia readmitted on 07/18/2019 with tachyarrhythmia/new onset A. fib shortness of breath---   A/p 1)Tachyarrhythmia/New onset A. Fib----recent episode of prolonged symptomatic bradycardia with heart rate in the 40s(junctional/ectopic atrial rhythm) -This is all in setting of Moderate  aortic stenosis and Moderate Mitral stenosis -Metoprolol was discontinued on 06/28/2019 -Await cardiology input, patient may need pacemaker for possible tachybradycardia syndrome --Avoid AV nodal blocking agents for now pending cardiology input -  CHA2DS2- VASc score   is = 7(CHF, age x2, CVA x 2, HTN, Female),   Which is  equal to =  11.2 % annual risk of stroke  This patients CHA2DS2-VASc Score and unadjusted  Ischemic Stroke Rate (% per year) is equal to 11.2 % stroke rate/year from a score of 7  However patient has had recent hematochezia in March 2020 presumably from bleeding internal hemorrhoids, please see full colonoscopy report -Also patient is currently on aspirin and Plavix for history of CAD and CVA --High risk of bleeding --Defer decision on full anticoagulation to cardiology team   2) HFpEF/ Chronic diastolic heart failure---  acute on chronic diastolic CHF exacerbation in the setting of tachyarrhythmia and moderate aortic stenosis and moderate mitral stenosis---  -Diuresing well with IV Lasix 40 mg every 12 hours,  C/n daily weights, input and output charting, -Troponins not elevated -EF is 60 to 65% per echo from 06/26/2019 2020  3)CAD--- status post prior coronary angioplasty with 5stentsprior to 2005,CABG 2006,follows with Dr. Michell Heinrich is not elevated, no ACS type symptoms, continue aspirin, continue Lipitor, continue Plavix, unable to give beta-blocker due to history of significant bradycardia -Await cardiology input regarding possible low-dose beta-blocker use  4)AKI----acute kidney injury on CKD stage - III--suspect worsening cardiac failure due to poor renal perfusion in the setting of CHF exacerbation and tachyarrhythmia    creatinine on admission= 1.75 ,   baseline creatinine = 1.3-1.4 ,  Creatinine is now 1.64, renally adjust medications, avoid nephrotoxic agents/dehydration/hypotension --Watch renal function closely with aggressive diuresis for CHF -Hold metformin -Potassium is down to 4.8 from 5.2  5) social/ethics--- plan of care discussed with patient and daughter ([name]), patient is a full code  6) Diabetes mellitus- A1c 8.5 weeks ago weeks ago reflecting uncontrolled diabetes - -= hold metformin due to kidney concerns -c/n Levemir 24 units every morning along with sliding scale coverage  7)CVA-with residual speech deficit- patient mostly  independent of her ADLs.  Continue aspirin and Lipitor  8)Chronic anemia-hemoglobin 9.2, which is close to patient's baseline -No evidence of active bleeding at this time  9) moderate aortic stenosis and moderate mitral stenosis--- ??  Echo from 06/29/2019 noted, await cardiology consult  10) presumed UTI--- treat empirically with IV Rocephin, recent urine culture with pansensitive E. coli  Disposition/Need for in-Hospital Stay- patient unable to be discharged at this time due to some dyspnea on exertion, needs to monitor heart rate with initiation of beta-blocker-given recent severe bradycardia while on beta-blocker -On IV antibiotics for UTI  Code Status : Full   Family Communication:    (patient is alert, awake and coherent) Daughter [name]  Disposition Plan  : Home  Consults  :  cardiology  DVT Prophylaxis  :  Lovenox   SCDs  -  Lab Results  Component Value Date   PLT 200 07/19/2019    Inpatient Medications  Scheduled Meds: . aspirin  81 mg Oral Q breakfast  . atorvastatin  20 mg Oral QHS  . clopidogrel  75 mg Oral Daily  . enoxaparin (LOVENOX) injection  30 mg Subcutaneous Q24H  . ferrous sulfate  324 mg Oral Daily  . furosemide  40 mg Intravenous Q12H  . hydrALAZINE  50 mg Oral TID  . insulin aspart  0-5 Units Subcutaneous QHS  . insulin aspart  0-9 Units Subcutaneous TID WC  . insulin detemir  24 Units Subcutaneous q morning - 10a  . pantoprazole  40 mg Oral Daily  . PARoxetine  20 mg Oral q morning - 10a  . polyethylene glycol  17 g Oral Daily  . sodium chloride flush  3 mL Intravenous Q12H   Continuous Infusions: . sodium chloride    . cefTRIAXone (ROCEPHIN)  IV     PRN Meds:.sodium chloride, acetaminophen **OR** acetaminophen, albuterol, hydrALAZINE, ondansetron **OR** ondansetron (ZOFRAN) IV, polyethylene glycol, sodium chloride flush, traZODone    Anti-infectives (From admission, onward)   Start     Dose/Rate Route Frequency Ordered Stop    07/19/19 1000  cefTRIAXone (ROCEPHIN) 1 g in sodium chloride 0.9 % 100 mL IVPB     1 g 200 mL/hr over 30 Minutes Intravenous Every 24 hours 07/19/19 0946          Objective:   Vitals:   07/18/19 2119 07/19/19 0500 07/19/19 0613 07/19/19 0918  BP: (!) 138/94  126/72 137/82  Pulse: 94  95 97  Resp: 20  20 16   Temp: 98.5 F (36.9 C)  98.2 F (36.8 C)   TempSrc: Oral  Oral   SpO2: 96%  95% 95%  Weight:  68.6 kg    Height:        Wt Readings from Last 3 Encounters:  07/19/19 68.6 kg  06/30/19 91.2 kg  02/15/19 67.1 kg     Intake/Output Summary (Last 24 hours) at 07/19/2019 0952 Last data filed at 07/19/2019 0900 Gross per 24 hour  Intake 240 ml  Output 1400 ml  Net -1160 ml     Physical Exam  Gen:- Awake Alert,  dyspnea on exertion HEENT:- [place].AT, No sclera icterus Nose [place] 2 L/min (O2 sats  was 88 to 90% on room air)  Neck-Supple Neck,No JVD,.  Lungs-improving air movement, faint bibasilar rales  CV- S1, S2 normal, irregularly irregular, 3/6 SM  Abd-  +ve B.Sounds, Abd Soft, No tenderness,    Extremity/Skin:- trace  edema, pedal pulses present  Psych-affect is appropriate, oriented x3 Neuro-speech deficit which is not new, no new focal deficits, no tremors   Data Review:   Micro Results Recent Results (from the past 240 hour(s))  SARS Coronavirus 2 [facility] order, Performed in [facility] hospital lab) Nasopharyngeal Nasopharyngeal Swab     Status: None   Collection Time: 07/18/19 12:26 PM   Specimen: Nasopharyngeal Swab  Result Value Ref Range Status   SARS Coronavirus 2 NEGATIVE NEGATIVE Final    Comment: (NOTE) If result is NEGATIVE SARS-CoV-2 target nucleic acids are NOT DETECTED. The SARS-CoV-2 RNA is generally detectable in upper and lower  respiratory specimens during the acute phase of infection. The lowest  concentration of SARS-CoV-2 viral copies this assay can detect is 250  copies / mL. A negative result does not preclude SARS-CoV-2 infection   and should not be used as the sole basis for treatment or other  patient management decisions.  A negative result may occur with  improper specimen collection / handling, submission of specimen other  than nasopharyngeal swab, presence of viral mutation(s) within the  areas targeted by this assay, and inadequate number of viral copies  (<250 copies / mL). A negative result must be combined with clinical  observations, patient history, and epidemiological information. If result is POSITIVE SARS-CoV-2 target nucleic acids are DETECTED. The SARS-CoV-2 RNA is generally detectable in upper and lower  respiratory specimens dur ing the acute phase of infection.  Positive  results are indicative of active infection with SARS-CoV-2.  Clinical  correlation with patient history and other diagnostic information is  necessary to determine patient infection status.  Positive results do  not rule out bacterial infection or co-infection with other viruses. If result is PRESUMPTIVE POSTIVE SARS-CoV-2 nucleic acids MAY BE PRESENT.   A presumptive positive result was obtained on the submitted specimen  and confirmed on repeat testing.  While 2019 novel coronavirus  (SARS-CoV-2) nucleic acids may be present in the submitted sample  additional confirmatory testing may be necessary for epidemiological  and / or clinical management purposes  to differentiate between  SARS-CoV-2 and other Sarbecovirus currently known to infect humans.  If clinically indicated additional testing with an alternate test  methodology [phone]) is advised. The SARS-CoV-2 RNA is generally  detectable in upper and lower respiratory sp ecimens during the acute  phase of infection. The expected result is Negative. Fact Sheet for Patients:  StrictlyIdeas.no Fact Sheet for Healthcare Providers: BankingDealers.co.za This test is not yet approved or cleared by the [place] FDA and has  been authorized for detection and/or diagnosis of SARS-CoV-2 by FDA under an Emergency Use Authorization (EUA).  This EUA will remain in effect (meaning this test can be used) for the duration of the COVID-19 declaration under Section 564(b)(1) of the Act, 21 U.S.C. section 360bbb-3(b)(1), unless the authorization is terminated or revoked sooner. Performed at [facility], [address] [place], [place] [address]     Radiology Reports Dg Chest 2 View  Result Date: 07/18/2019 CLINICAL DATA:  Short of breath EXAM: CHEST - 2 VIEW COMPARISON:  06/30/2019 FINDINGS: Postop CABG. Interval development of diffuse bilateral airspace disease with the appearance of edema. Interval development of small pleural effusion. Mild atelectasis  in the bases. Severe lumbar compression fracture of L1 appears chronic and unchanged from prior studies. IMPRESSION: Diffuse bilateral airspace disease with small effusion consistent with congestive heart failure and edema. Electronically Signed   By: Franchot Gallo M.D.   On: 07/18/2019 11:15   Dg Chest 2 View  Result Date: 06/30/2019 CLINICAL DATA:  Chest pain. EXAM: CHEST - 2 VIEW COMPARISON:  Radiographs of June 28, 2019. FINDINGS: Stable cardiomegaly. Status post coronary bypass graft. Atherosclerosis of thoracic aorta is noted. No pneumothorax is noted. Left lung is clear. Minimal right basilar subsegmental atelectasis is noted with minimal right pleural effusion. The visualized skeletal structures are unremarkable. IMPRESSION: Minimal right basilar subsegmental atelectasis with minimal right pleural effusion. Aortic Atherosclerosis (ICD10-I70.0). Electronically Signed   By: Marijo Conception M.D.   On: 06/30/2019 13:48   Dg Chest 2 View  Result Date: 06/28/2019 CLINICAL DATA:  Weakness.  Left lower rib pain.  Fall last week. EXAM: CHEST - 2 VIEW COMPARISON:  Chest x-ray dated January 20, 2019. FINDINGS: Stable cardiomediastinal silhouette status post CABG.  Atherosclerotic calcification of the aortic arch. Normal pulmonary vascularity. No focal consolidation, pleural effusion, or pneumothorax. No acute osseous abnormality. Chronic severe L1 compression deformity. IMPRESSION: No active cardiopulmonary disease. Electronically Signed   By: Titus Dubin M.D.   On: 06/28/2019 17:30   Ct Head Wo Contrast  Result Date: 06/29/2019 CLINICAL DATA:  85 year old female with multiple falls. EXAM: CT HEAD WITHOUT CONTRAST TECHNIQUE: Contiguous axial images were obtained from the base of the skull through the vertex without intravenous contrast. COMPARISON:  Head CT dated 01/31/2019 FINDINGS: Brain: There is moderate age-related atrophy and chronic microvascular ischemic changes. 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 There is no acute intracranial hemorrhage. No mass effect or midline shift. No extra-axial fluid collection. Vascular: No hyperdense vessel or unexpected calcification. Skull: Normal. Negative for fracture or focal lesion. Sinuses/Orbits: Diffuse mucoperiosteal thickening of paranasal sinuses. No air-fluid level. The mastoid air cells are clear. Other: None IMPRESSION: 1. No acute intracranial hemorrhage. 2. Age-related atrophy and chronic microvascular ischemic changes. 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 Electronically Signed   By: Anner Crete M.D.   On: 06/29/2019 01:24   Nm Myocar Multi W/spect W/wall Motion / Ef  Result Date: 06/30/2019  There was no ST segment deviation noted during stress.  Defect 1: There is a small defect of mild severity.  This is a low risk study.  Nuclear stress EF: 57%.  No significant reversible ischemia. LVEF 57% with normal wall motion. This is a low risk study.   Dg Hips Bilat W Or Wo Pelvis 2 Views  Result Date: 06/28/2019 CLINICAL DATA:  Fall 1 week ago with persistent left-sided hip pain, initial encounter EXAM: DG HIP (WITH OR WITHOUT PELVIS)  4V BILAT COMPARISON:  10/26/2016 FINDINGS: The pelvic ring is intact. Diffuse vascular calcifications are seen. No acute fracture or dislocation is noted. No soft tissue abnormality is seen. IMPRESSION: No acute abnormality noted. Electronically Signed   By: Inez Catalina M.D.   On: 06/28/2019 18:03     CBC Recent Labs  Lab 07/18/19 1004 07/19/19 0434  WBC 6.4 5.1  HGB 9.2* 9.1*  HCT 30.2* 29.6*  PLT 217 200  MCV 95.0 96.1  MCH 28.9 29.5  MCHC 30.5 30.7  RDW 14.3 14.1  LYMPHSABS 0.6*  --   MONOABS 0.3  --   EOSABS 0.1  --   BASOSABS 0.1  --  Chemistries  Recent Labs  Lab 07/18/19 1004 07/19/19 0434  NA 137 138  K 5.2* 4.8  CL 103 103  CO2 24 28  GLUCOSE 171* 132*  BUN 37* 38*  CREATININE 1.75* 1.64*  CALCIUM 9.3 9.3  AST 17  --   ALT 17  --   ALKPHOS 41  --   BILITOT 0.7  --    ------------------------------------------------------------------------------------------------------------------ No results for input(s): CHOL, HDL, LDLCALC, TRIG, CHOLHDL, LDLDIRECT in the last 72 hours.  Lab Results  Component Value Date   HGBA1C 8.5 (H) 06/29/2019   ------------------------------------------------------------------------------------------------------------------ No results for input(s): TSH, T4TOTAL, T3FREE, THYROIDAB in the last 72 hours.  Invalid input(s): FREET3 ------------------------------------------------------------------------------------------------------------------ No results for input(s): VITAMINB12, FOLATE, FERRITIN, TIBC, IRON, RETICCTPCT in the last 72 hours.  Coagulation profile No results for input(s): INR, PROTIME in the last 168 hours.  No results for input(s): DDIMER in the last 72 hours.  Cardiac Enzymes No results for input(s): CKMB, TROPONINI, MYOGLOBIN in the last 168 hours.  Invalid input(s): CK ------------------------------------------------------------------------------------------------------------------    Component Value  Date/Time   BNP 283.0 (H) 07/18/2019 1004     Roxan Hockey M.D on 07/19/2019 at 9:52 AM  Go to [email or web address] - for contact info  [facility] - Office  [phone]

## 2019-07-19 NOTE — Plan of Care (Signed)

## 2019-07-20 ENCOUNTER — Telehealth: Payer: Self-pay | Admitting: Cardiology

## 2019-07-20 ENCOUNTER — Telehealth: Payer: Medicare Other | Admitting: Adult Health

## 2019-07-20 DIAGNOSIS — N390 Urinary tract infection, site not specified: Secondary | ICD-10-CM

## 2019-07-20 LAB — BASIC METABOLIC PANEL
Anion gap: 9 (ref 5–15)
BUN: 42 mg/dL — ABNORMAL HIGH (ref 8–23)
CO2: 27 mmol/L (ref 22–32)
Calcium: 9.6 mg/dL (ref 8.9–10.3)
Chloride: 101 mmol/L (ref 98–111)
Creatinine, Ser: 1.63 mg/dL — ABNORMAL HIGH (ref 0.44–1.00)
GFR calc Af Amer: 33 mL/min — ABNORMAL LOW (ref >60)
GFR calc non Af Amer: 28 mL/min — ABNORMAL LOW (ref >60)
Glucose, Bld: 84 mg/dL (ref 70–99)
Potassium: 4.5 mmol/L (ref 3.5–5.1)
Sodium: 137 mmol/L (ref 135–145)

## 2019-07-20 LAB — MAGNESIUM: Magnesium: 2.4 mg/dL (ref 1.7–2.4)

## 2019-07-20 LAB — GLUCOSE, CAPILLARY
Glucose-Capillary: 72 mg/dL (ref 70–99)
Glucose-Capillary: 159 mg/dL — ABNORMAL HIGH (ref 70–99)
Glucose-Capillary: 201 mg/dL — ABNORMAL HIGH (ref 70–99)
Glucose-Capillary: 115 mg/dL — ABNORMAL HIGH (ref 70–99)

## 2019-07-20 MED ORDER — METOPROLOL TARTRATE 25 MG PO TABS
25.0000 mg | ORAL_TABLET | Freq: Two times a day (BID) | ORAL | Status: DC
  Administered 2019-07-20 – 2019-07-25 (×10): 25 mg via ORAL
  Filled 2019-07-20 (×10): qty 1

## 2019-07-20 MED ORDER — LACTULOSE 10 GM/15ML PO SOLN
30.0000 g | Freq: Once | ORAL | Status: AC
  Administered 2019-07-20: 30 g via ORAL
  Filled 2019-07-20 (×2): qty 60

## 2019-07-20 NOTE — Telephone Encounter (Signed)
Called daughter back- she states mother is in the hospital, they are looking at changing her medications in the hospital- I advised that hospital would handle everything there. She updated her numbers on the chart.

## 2019-07-20 NOTE — Progress Notes (Deleted)
Removed IV-clean, dry, intact. Wheeled stable patient and belongings to [employer] where she was picked up by her daughter. I reviewed d/c paperwork with daughter.

## 2019-07-20 NOTE — Plan of Care (Signed)
  Problem: Education: Goal: Knowledge of General Education information will improve Description: Including pain rating scale, medication(s)/side effects and non-pharmacologic comfort measures 07/20/2019 1906 by Santa Lighter, RN Outcome: Progressing 07/20/2019 1222 by Santa Lighter, RN Outcome: Progressing   Problem: Health Behavior/Discharge Planning: Goal: Ability to manage health-related needs will improve 07/20/2019 1906 by Santa Lighter, RN Outcome: Progressing 07/20/2019 1222 by Santa Lighter, RN Outcome: Progressing

## 2019-07-20 NOTE — Progress Notes (Signed)
Patient Demographics:    [name], is a 85 y.o. female, DOB - [birth year], BMW:[id number]  Admit date - 07/18/2019   Admitting Physician Alejandra Barna Denton Brick, MD  Outpatient Primary MD for the patient is [name], MD  LOS - 1   Chief Complaint  Patient presents with  . Abdominal Pain        Subjective:    [name] today has no fevers, no emesis,  No chest pain,   - c/o constipation, some DOE, no shob at rest  Assessment  & Plan :    Principal Problem:   New onset a-fib (HCC) Active Problems:   (HFpEF) heart failure with preserved ejection fraction (HCC)   Type 2 diabetes mellitus with insulin therapy (HCC)   HTN (hypertension)   Anemia of chronic disease   CAD (coronary artery disease)  Brief Summary:- 85 y.o. female with PMHx relevant for  CAD s/p CABG (2006), carotic artery stenosis, HTN with hx of hypertensive urgency, CKD stage III, DM, dyslipidemia, GERD, and chronic diastolic heart failure, prior CVA with residual slurred speech,  aortic stenosis and Mitral stenosis, CKD 3, asthma, hypertension and diabetes recent history of bradycardia with junctional bradycardia readmitted on 07/18/2019 with tachyarrhythmia/new onset A. fib shortness of breath---    A/p 1)Tachyarrhythmia/New onset A. Fib----recent episode of prolonged symptomatic bradycardia with heart rate in the 40s(junctional/ectopic atrial rhythm) -This is all in setting of Moderate  aortic stenosis and Moderate Mitral stenosis -Metoprolol was discontinued on 06/28/2019 - cardiology input appreciated,  - started on Metoprolol 12.5 mg bid on 07/19/2019,  -Tachycardia persist so metoprolol being increased to 25 mg twice daily on 07/20/2019 -If she has recurrent pauses with this like she did with 50mg  BID, will need to consider EP evaluation for tachy-brady syndrome and consideration of PPM placement.  -  CHA2DS2- VASc  score   is = 7(CHF, age x2, CVA x 2, HTN, Female),   Which is  equal to =  11.2 % annual risk of stroke  This patients CHA2DS2-VASc Score and unadjusted Ischemic Stroke Rate (% per year) is equal to 11.2 % stroke rate/year from a score of 7  However patient has had recent hematochezia in March 2020 presumably from bleeding internal hemorrhoids, please see full colonoscopy report -History of recurrent falls due to bradycardia/arrhythmias -Also patient is currently on aspirin and Plavix for history of CAD and CVA --High risk of bleeding --Defer decision on full anticoagulation/Coumadin therapy to cardiology team and pt's daughter -Not a candidate for NOAC due to moderate mitral stenosis (will have to be on Coumadin) -Discussed with patient's daughter (ms [name]) risk versus benefits of anticoagulation and she would like to talk to cardiology before making a final decision  2) HFpEF/ Chronic diastolic heart failure---  acute on chronic diastolic CHF exacerbation in the setting of tachyarrhythmia and moderate aortic stenosis and moderate mitral stenosis---  -Diuresing well with IV Lasix 40 mg every 12 hours,  C/n daily weights, input and output charting, -Troponins not elevated -EF is 60 to 65% per echo from 06/26/2019 2020 -Fluid balance is negative -Weight is down to 155 pounds from 163 pounds on 07/18/2019  3)CAD--- status post prior coronary angioplasty with 5stentsprior to 2005,CABG 2006,follows with Dr. Michell Heinrich is not  elevated, no ACS type symptoms, continue aspirin, continue Lipitor, continue Plavix,  -Cardiology input appreciated metoprolol being titrated as above in #1   4)AKI----acute kidney injury on CKD stage - III--suspect worsening cardiac failure due to poor renal perfusion in the setting of CHF exacerbation and tachyarrhythmia    creatinine on admission= 1.75 ,   baseline creatinine = 1.3-1.4 ,  Creatinine is now 1.63, renally adjust medications, avoid nephrotoxic  agents/dehydration/hypotension --Watch renal function closely with aggressive diuresis for CHF -Hold metformin  5) social/ethics--- plan of care discussed with patient and daughter ([name]), patient is a full code  6) Diabetes mellitus- A1c 8.5 weeks ago weeks ago reflecting uncontrolled diabetes - -= hold metformin due to kidney concerns -c/n Levemir 24 units every morning along with sliding scale coverage  7)CVA-with residual speech deficit- patient mostly independent of her ADLs.  Continue aspirin and Lipitor  8)Chronic anemia-hemoglobin 9.2, which is close to patient's baseline -No evidence of active bleeding at this time  9)Moderate aortic stenosis and moderate mitral stenosis--- ??  Echo from 06/29/2019 noted,  cardiology consult appreciated  10) presumed UTI--- treat empirically with IV Rocephin, recent urine culture with pansensitive E. coli  Disposition/Need for in-Hospital Stay- patient unable to be discharged at this time due to some dyspnea on exertion, needs to monitor heart rate with initiation of beta-blocker-given recent severe bradycardia while on beta-blocker--- metoprolol been titrated up if she has recurrent pauses with this like she did with 50mg  BID, will need to consider EP evaluation for tachy-brady syndrome and consideration of PPM placement.  -On IV antibiotics for UTI  Code Status : Full   Family Communication:    (patient is alert, awake and coherent) Daughter [name]   Disposition Plan  : Home  Consults  :  cardiology  DVT Prophylaxis  :  Lovenox   SCDs  -  Lab Results  Component Value Date   PLT 200 07/19/2019    Inpatient Medications  Scheduled Meds: . aspirin  81 mg Oral Q breakfast  . atorvastatin  20 mg Oral QHS  . clopidogrel  75 mg Oral Daily  . enoxaparin (LOVENOX) injection  30 mg Subcutaneous Q24H  . ferrous sulfate  324 mg Oral Daily  . furosemide  40 mg Intravenous Q12H  . hydrALAZINE  50 mg Oral TID  . insulin aspart   0-5 Units Subcutaneous QHS  . insulin aspart  0-9 Units Subcutaneous TID WC  . insulin detemir  24 Units Subcutaneous q morning - 10a  . lactulose  30 g Oral Once  . metoprolol tartrate  25 mg Oral BID  . pantoprazole  40 mg Oral Daily  . PARoxetine  20 mg Oral q morning - 10a  . polyethylene glycol  17 g Oral Daily  . sodium chloride flush  3 mL Intravenous Q12H   Continuous Infusions: . sodium chloride    . cefTRIAXone (ROCEPHIN)  IV 1 g (07/20/19 1032)   PRN Meds:.sodium chloride, acetaminophen **OR** acetaminophen, albuterol, hydrALAZINE, ondansetron **OR** ondansetron (ZOFRAN) IV, polyethylene glycol, sodium chloride flush, traZODone    Anti-infectives (From admission, onward)   Start     Dose/Rate Route Frequency Ordered Stop   07/19/19 1000  cefTRIAXone (ROCEPHIN) 1 g in sodium chloride 0.9 % 100 mL IVPB     1 g 200 mL/hr over 30 Minutes Intravenous Every 24 hours 07/19/19 0946          Objective:   Vitals:   07/19/19 2107 07/20/19 0407 07/20/19 0500  07/20/19 1224  BP: 114/74 121/81    Pulse: 96 (!) 101    Resp: 16 18    Temp: 98.3 F (36.8 C) 98.1 F (36.7 C)    TempSrc: Oral Oral    SpO2: 98% 95%  98%  Weight:   70.6 kg   Height:        Wt Readings from Last 3 Encounters:  07/20/19 70.6 kg  06/30/19 91.2 kg  02/15/19 67.1 kg     Intake/Output Summary (Last 24 hours) at 07/20/2019 1238 Last data filed at 07/20/2019 0600 Gross per 24 hour  Intake 579.53 ml  Output 1751 ml  Net -1171.47 ml    Physical Exam  Gen:- Awake Alert, no shortness of breath at rest, dyspnea on exertion HEENT:- [place].AT, No sclera icterus Nose Frio 2 L/min (O2 sats was 88 to 90% on room air)  Neck-Supple Neck,No JVD,.  Lungs-improving air movement, faint bibasilar rales  CV- S1, S2 normal, irregularly irregular, 3/6 SM, prior sternotomy scar Abd-  +ve B.Sounds, Abd Soft, No tenderness,    Extremity/Skin:- trace  edema, pedal pulses present  Psych-affect is appropriate,  oriented x3 Neuro-speech deficit which is not new, no new focal deficits, no tremors   Data Review:   Micro Results Recent Results (from the past 240 hour(s))  SARS Coronavirus 2 [facility] order, Performed in [facility] hospital lab) Nasopharyngeal Nasopharyngeal Swab     Status: None   Collection Time: 07/18/19 12:26 PM   Specimen: Nasopharyngeal Swab  Result Value Ref Range Status   SARS Coronavirus 2 NEGATIVE NEGATIVE Final    Comment: (NOTE) If result is NEGATIVE SARS-CoV-2 target nucleic acids are NOT DETECTED. The SARS-CoV-2 RNA is generally detectable in upper and lower  respiratory specimens during the acute phase of infection. The lowest  concentration of SARS-CoV-2 viral copies this assay can detect is 250  copies / mL. A negative result does not preclude SARS-CoV-2 infection  and should not be used as the sole basis for treatment or other  patient management decisions.  A negative result may occur with  improper specimen collection / handling, submission of specimen other  than nasopharyngeal swab, presence of viral mutation(s) within the  areas targeted by this assay, and inadequate number of viral copies  (<250 copies / mL). A negative result must be combined with clinical  observations, patient history, and epidemiological information. If result is POSITIVE SARS-CoV-2 target nucleic acids are DETECTED. The SARS-CoV-2 RNA is generally detectable in upper and lower  respiratory specimens dur ing the acute phase of infection.  Positive  results are indicative of active infection with SARS-CoV-2.  Clinical  correlation with patient history and other diagnostic information is  necessary to determine patient infection status.  Positive results do  not rule out bacterial infection or co-infection with other viruses. If result is PRESUMPTIVE POSTIVE SARS-CoV-2 nucleic acids MAY BE PRESENT.   A presumptive positive result was obtained on the submitted specimen  and  confirmed on repeat testing.  While 2019 novel coronavirus  (SARS-CoV-2) nucleic acids may be present in the submitted sample  additional confirmatory testing may be necessary for epidemiological  and / or clinical management purposes  to differentiate between  SARS-CoV-2 and other Sarbecovirus currently known to infect humans.  If clinically indicated additional testing with an alternate test  methodology [phone]) is advised. The SARS-CoV-2 RNA is generally  detectable in upper and lower respiratory sp ecimens during the acute  phase of infection. The expected result is  Negative. Fact Sheet for Patients:  StrictlyIdeas.no Fact Sheet for Healthcare Providers: BankingDealers.co.za This test is not yet approved or cleared by the [place] FDA and has been authorized for detection and/or diagnosis of SARS-CoV-2 by FDA under an Emergency Use Authorization (EUA).  This EUA will remain in effect (meaning this test can be used) for the duration of the COVID-19 declaration under Section 564(b)(1) of the Act, 21 U.S.C. section 360bbb-3(b)(1), unless the authorization is terminated or revoked sooner. Performed at [facility], [address] [place], [place] [address]     Radiology Reports Dg Chest 2 View  Result Date: 07/18/2019 CLINICAL DATA:  Short of breath EXAM: CHEST - 2 VIEW COMPARISON:  06/30/2019 FINDINGS: Postop CABG. Interval development of diffuse bilateral airspace disease with the appearance of edema. Interval development of small pleural effusion. Mild atelectasis in the bases. Severe lumbar compression fracture of L1 appears chronic and unchanged from prior studies. IMPRESSION: Diffuse bilateral airspace disease with small effusion consistent with congestive heart failure and edema. Electronically Signed   By: Franchot Gallo M.D.   On: 07/18/2019 11:15   Dg Chest 2 View  Result Date: 06/30/2019 CLINICAL DATA:  Chest pain. EXAM:  CHEST - 2 VIEW COMPARISON:  Radiographs of June 28, 2019. FINDINGS: Stable cardiomegaly. Status post coronary bypass graft. Atherosclerosis of thoracic aorta is noted. No pneumothorax is noted. Left lung is clear. Minimal right basilar subsegmental atelectasis is noted with minimal right pleural effusion. The visualized skeletal structures are unremarkable. IMPRESSION: Minimal right basilar subsegmental atelectasis with minimal right pleural effusion. Aortic Atherosclerosis (ICD10-I70.0). Electronically Signed   By: Marijo Conception M.D.   On: 06/30/2019 13:48   Dg Chest 2 View  Result Date: 06/28/2019 CLINICAL DATA:  Weakness.  Left lower rib pain.  Fall last week. EXAM: CHEST - 2 VIEW COMPARISON:  Chest x-ray dated January 20, 2019. FINDINGS: Stable cardiomediastinal silhouette status post CABG. Atherosclerotic calcification of the aortic arch. Normal pulmonary vascularity. No focal consolidation, pleural effusion, or pneumothorax. No acute osseous abnormality. Chronic severe L1 compression deformity. IMPRESSION: No active cardiopulmonary disease. Electronically Signed   By: Titus Dubin M.D.   On: 06/28/2019 17:30   Ct Head Wo Contrast  Result Date: 06/29/2019 CLINICAL DATA:  85 year old female with multiple falls. EXAM: CT HEAD WITHOUT CONTRAST TECHNIQUE: Contiguous axial images were obtained from the base of the skull through the vertex without intravenous contrast. COMPARISON:  Head CT dated 01/31/2019 FINDINGS: Brain: There is moderate age-related atrophy and chronic microvascular ischemic changes. 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 There is no acute intracranial hemorrhage. No mass effect or midline shift. No extra-axial fluid collection. Vascular: No hyperdense vessel or unexpected calcification. Skull: Normal. Negative for fracture or focal lesion. Sinuses/Orbits: Diffuse mucoperiosteal thickening of paranasal sinuses. No air-fluid level. The  mastoid air cells are clear. Other: None IMPRESSION: 1. No acute intracranial hemorrhage. 2. Age-related atrophy and chronic microvascular ischemic changes. 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 Electronically Signed   By: Anner Crete M.D.   On: 06/29/2019 01:24   Nm Myocar Multi W/spect W/wall Motion / Ef  Result Date: 06/30/2019  There was no ST segment deviation noted during stress.  Defect 1: There is a small defect of mild severity.  This is a low risk study.  Nuclear stress EF: 57%.  No significant reversible ischemia. LVEF 57% with normal wall motion. This is a low risk study.   Dg Hips Bilat W Or Wo  Pelvis 2 Views  Result Date: 06/28/2019 CLINICAL DATA:  Fall 1 week ago with persistent left-sided hip pain, initial encounter EXAM: DG HIP (WITH OR WITHOUT PELVIS) 4V BILAT COMPARISON:  10/26/2016 FINDINGS: The pelvic ring is intact. Diffuse vascular calcifications are seen. No acute fracture or dislocation is noted. No soft tissue abnormality is seen. IMPRESSION: No acute abnormality noted. Electronically Signed   By: Inez Catalina M.D.   On: 06/28/2019 18:03     CBC Recent Labs  Lab 07/18/19 1004 07/19/19 0434  WBC 6.4 5.1  HGB 9.2* 9.1*  HCT 30.2* 29.6*  PLT 217 200  MCV 95.0 96.1  MCH 28.9 29.5  MCHC 30.5 30.7  RDW 14.3 14.1  LYMPHSABS 0.6*  --   MONOABS 0.3  --   EOSABS 0.1  --   BASOSABS 0.1  --     Chemistries  Recent Labs  Lab 07/18/19 1004 07/19/19 0434 07/20/19 0812  NA 137 138 137  K 5.2* 4.8 4.5  CL 103 103 101  CO2 24 28 27   GLUCOSE 171* 132* 84  BUN 37* 38* 42*  CREATININE 1.75* 1.64* 1.63*  CALCIUM 9.3 9.3 9.6  MG  --   --  2.4  AST 17  --   --   ALT 17  --   --   ALKPHOS 41  --   --   BILITOT 0.7  --   --    ------------------------------------------------------------------------------------------------------------------ No results for input(s): CHOL, HDL, LDLCALC, TRIG, CHOLHDL, LDLDIRECT in the last 72 hours.  Lab  Results  Component Value Date   HGBA1C 8.5 (H) 06/29/2019   ------------------------------------------------------------------------------------------------------------------ No results for input(s): TSH, T4TOTAL, T3FREE, THYROIDAB in the last 72 hours.  Invalid input(s): FREET3 ------------------------------------------------------------------------------------------------------------------ No results for input(s): VITAMINB12, FOLATE, FERRITIN, TIBC, IRON, RETICCTPCT in the last 72 hours.  Coagulation profile No results for input(s): INR, PROTIME in the last 168 hours.  No results for input(s): DDIMER in the last 72 hours.  Cardiac Enzymes No results for input(s): CKMB, TROPONINI, MYOGLOBIN in the last 168 hours.  Invalid input(s): CK ------------------------------------------------------------------------------------------------------------------    Component Value Date/Time   BNP 283.0 (H) 07/18/2019 1004   Roxan Hockey M.D on 07/20/2019 at 12:38 PM  Go to [email or web address] - for contact info  [facility] - Office  [phone]

## 2019-07-20 NOTE — Plan of Care (Signed)
  Problem: Education: Goal: Knowledge of General Education information will improve Description Including pain rating scale, medication(s)/side effects and non-pharmacologic comfort measures Outcome: Progressing   Problem: Health Behavior/Discharge Planning: Goal: Ability to manage health-related needs will improve Outcome: Progressing   

## 2019-07-20 NOTE — Progress Notes (Addendum)
Progress Note  Patient Name: [name] Date of Encounter: 07/20/2019  Primary Cardiologist: Minus Breeding, MD   Subjective   Denies any chest pain or palpitations. Breathing has improved but she has only ambulated to the bedside commode. Uses a walker at home.   Inpatient Medications    Scheduled Meds:  aspirin  81 mg Oral Q breakfast   atorvastatin  20 mg Oral QHS   clopidogrel  75 mg Oral Daily   enoxaparin (LOVENOX) injection  30 mg Subcutaneous Q24H   ferrous sulfate  324 mg Oral Daily   furosemide  40 mg Intravenous Q12H   hydrALAZINE  50 mg Oral TID   insulin aspart  0-5 Units Subcutaneous QHS   insulin aspart  0-9 Units Subcutaneous TID WC   insulin detemir  24 Units Subcutaneous q morning - 10a   metoprolol tartrate  12.5 mg Oral BID   pantoprazole  40 mg Oral Daily   PARoxetine  20 mg Oral q morning - 10a   polyethylene glycol  17 g Oral Daily   sodium chloride flush  3 mL Intravenous Q12H   Continuous Infusions:  sodium chloride     cefTRIAXone (ROCEPHIN)  IV 1 g (07/19/19 1206)   PRN Meds: sodium chloride, acetaminophen **OR** acetaminophen, albuterol, hydrALAZINE, ondansetron **OR** ondansetron (ZOFRAN) IV, polyethylene glycol, sodium chloride flush, traZODone   Vital Signs    Vitals:   07/19/19 1316 07/19/19 2107 07/20/19 0407 07/20/19 0500  BP: 114/64 114/74 121/81   Pulse: 96 96 (!) 101   Resp: 17 16 18    Temp: 98 F (36.7 C) 98.3 F (36.8 C) 98.1 F (36.7 C)   TempSrc:  Oral Oral   SpO2: 98% 98% 95%   Weight:    70.6 kg  Height:        Intake/Output Summary (Last 24 hours) at 07/20/2019 0753 Last data filed at 07/20/2019 0600 Gross per 24 hour  Intake 822.53 ml  Output 1751 ml  Net -928.47 ml    Last 3 Weights 07/20/2019 07/19/2019 07/18/2019  Weight (lbs) 155 lb 10.3 oz 151 lb 3.8 oz 163 lb  Weight (kg) 70.6 kg 68.6 kg 73.936 kg      Telemetry    Atrial fibrillation, HR in 90's to 110's. Occasional pauses up  to 1.6 seconds.  - Personally Reviewed  ECG    No new tracings.  Physical Exam   General: Well developed, elderly female appearing in no acute distress. Head: Normocephalic, atraumatic.  Neck: Supple without bruits, JVD not elevated. Lungs:  Resp regular and unlabored, mild rales along bases bilaterally. Heart: Irregularly irregular, S1, S2, no S3, S4,  no rub. 2/6 SEM along RUSB.  Abdomen: Soft, non-tender, non-distended with normoactive bowel sounds. No hepatomegaly. No rebound/guarding. No obvious abdominal masses. Extremities: No clubbing, cyanosis, or lower extremity edema. Distal pedal pulses are 2+ bilaterally. Neuro: Alert and oriented X 3. Moves all extremities spontaneously. Psych: Normal affect.  Labs    Chemistry Recent Labs  Lab 07/18/19 1004 07/19/19 0434  NA 137 138  K 5.2* 4.8  CL 103 103  CO2 24 28  GLUCOSE 171* 132*  BUN 37* 38*  CREATININE 1.75* 1.64*  CALCIUM 9.3 9.3  PROT 6.7  --   ALBUMIN 3.5  --   AST 17  --   ALT 17  --   ALKPHOS 41  --   BILITOT 0.7  --   GFRNONAA 26* 28*  GFRAA 30* 33*  ANIONGAP 10 7  Hematology Recent Labs  Lab 07/18/19 1004 07/19/19 0434  WBC 6.4 5.1  RBC 3.18* 3.08*  HGB 9.2* 9.1*  HCT 30.2* 29.6*  MCV 95.0 96.1  MCH 28.9 29.5  MCHC 30.5 30.7  RDW 14.3 14.1  PLT 217 200    Cardiac EnzymesNo results for input(s): TROPONINI in the last 168 hours. No results for input(s): TROPIPOC in the last 168 hours.   BNP Recent Labs  Lab 07/18/19 1004  BNP 283.0*     DDimer No results for input(s): DDIMER in the last 168 hours.   Radiology    Dg Chest 2 View  Result Date: 07/18/2019 CLINICAL DATA:  Short of breath EXAM: CHEST - 2 VIEW COMPARISON:  06/30/2019 FINDINGS: Postop CABG. Interval development of diffuse bilateral airspace disease with the appearance of edema. Interval development of small pleural effusion. Mild atelectasis in the bases. Severe lumbar compression fracture of L1 appears chronic and  unchanged from prior studies. IMPRESSION: Diffuse bilateral airspace disease with small effusion consistent with congestive heart failure and edema. Electronically Signed   By: Franchot Gallo M.D.   On: 07/18/2019 11:15    Cardiac Studies   Echocardiogram: 06/29/2019 IMPRESSIONS   1. The left ventricle has normal systolic function with an ejection fraction of 60-65%. The cavity size was normal. There is moderate concentric left ventricular hypertrophy. Left ventricular diastolic Doppler parameters are consistent with  pseudonormalization. Elevated mean left atrial pressure.  2. The right ventricle has normal systolic function. The cavity was normal. There is no increase in right ventricular wall thickness. Right ventricular systolic pressure is mildly elevated with an estimated pressure of 36.6 mmHg.  3. Left atrial size was severely dilated.  4. The mitral valve is degenerative. Mild thickening of the mitral valve leaflet. Mild calcification of the mitral valve leaflet. There is severe mitral annular calcification present. Mitral valve regurgitation is mild to moderate by color flow Doppler.  Moderate mitral valve stenosis.  5. The aortic valve is tricuspid. Moderate thickening of the aortic valve. Moderate calcification of the aortic valve. Moderate stenosis of the aortic valve.  6. The aorta is normal in size and structure.  7. The inferior vena cava was dilated in size with >50% respiratory variability.  8. When compared to the prior study: 10/08/2018, there is very slight worsening of the aortic stenosis. Mitral valve gradients appear lower due to bradycardia.  Patient Profile     85 y.o. female w/ PMH of CAD (s/p CABG in 2006), carotid artery stenosis, aortic stenosis, HTN, HLD, and Type 2 DM, Stage 3 CKD, and prior CVA who is currently admitted for a CHF exacerbation and new-onset atrial fibrillation.   Assessment & Plan    1. New-Onset Atrial Fibrillation - this is a new diagnosis  for the patient and she presented with worsening dyspnea. No specific chest pain or palpitations. Was admitted last month for bradycardia and PTA Lopressor 50mg  BID was discontinued.  - rates have remained variable in the 90's to 110's. Was restarted on Lopressor 12.5mg  BID yesterday and received her AM dose early today due to elevated rates. Will write for additional 12.5mg  dose with her regular AM meds then to titrate to 25mg  BID. If she has recurrent pauses with this like she did with 50mg  BID, will need to consider EP evaluation for tachy-brady syndrome and consideration of PPM placement.  - This patients CHA2DS2-VASc Score and unadjusted Ischemic Stroke Rate (% per year) is equal to 10.8 % stroke rate/year from a score  of 8 (HTN, DM, Vascular, Female, Age (2), CVA (2)). Ideally, given her elevated score she should be on anticoagulation but by review of notes and discussion with the admitting team she has chronic anemia and diverticulosis and internal hemorrhoids by colonoscopy earlier this year. Not an ideal NOAC candidate given her valvular heart disease and moderate mitral stenosis. She defers decisions regarding this to her daughter Risk analyst). I tried calling her yesterday and unable to reach her. Will try again this morning.   2. Acute on Chronic Diastolic CHF - likely secondary to atrial fibrillation with RVR. BNP 283. CXR shows diffuse bilateral airspace disease with small effusion consistent with congestive heart failure and edema. - currently receiving IV Lasix 40mg  BID with a recorded net output of -2.3L. Recorded weights have been variable. Was on Lasix 20mg  daily PTA and suspect this will need to be titrated to 40mg  daily at the time of discharge with close outpatient follow-up and repeat BMET.   3. CAD - s/p CABG in 2006 with recent NST last admission showing no significant reversible ischemia and was overall a low-risk study. - she has baseline dyspnea but denies any recent chest  pain.  - continue PTA ASA, Plavix, and statin therapy. Presumably on Plavix at this time from a Neurology perspective given multiple CVA's as she has not required any recent PCI.   4. Valvular Heart Disease - recent echo last month showed moderate mitral stenosis, mild to moderate mitral regurgitation and moderate AS. - will need to be followed as an outpatient.   5. HTN - BP has been stable at 114/64 - 137/82 within the past 24 hours.  - continue PTA Hydralazine. Amlodipine held to allow for further titration of AV nodal blocking agents.   6. Stage 3 CKD - baseline creatinine 1.3 - 1.4. At 1.75 on admission and improved to 1.64 on 07/19/2019. Will recheck BMET this AM.   7. UTI - Urine Culture positive for E. Coli. She has been started on Rocephin by the admitting team.    For questions or updates, please contact [place] Please consult [email or web address] for contact info under Cardiology/STEMI.   Signed, Erma Heritage , PA-C 7:53 AM 07/20/2019 Pager: [phone]  The patient was seen and examined, and I agree with the history, physical exam, assessment and plan as documented above.  Only complaint this morning is related to "gas pain" as she points to her abdomen. She complains of constipation.  HR's suboptimally controlled. Will increase Lopressor to 25 mg bid and monitor for bradycardia/pauses. If this were to recur, she would need an evaluation for a pacemaker for tachycardia-bradycardia syndrome.  Continue IV Lasix. Awaiting BMET results from this morning.  See discussion regarding anticoagulation above.  Kate Sable, MD, [facility]  07/20/2019 8:48 AM

## 2019-07-20 NOTE — Progress Notes (Signed)
Patients heart rate is fluctuating between 89-117. Pt running afib on monitor. Paged MD. MD gave verbal order to give morning dose of metoprolol now. Will continue to monitor throughout shift.

## 2019-07-20 NOTE — Plan of Care (Signed)
  Problem: Education: Goal: Knowledge of General Education information will improve Description: Including pain rating scale, medication(s)/side effects and non-pharmacologic comfort measures 07/20/2019 1906 by Santa Lighter, RN Outcome: Adequate for Discharge 07/20/2019 1906 by Santa Lighter, RN Outcome: Progressing 07/20/2019 1222 by Santa Lighter, RN Outcome: Progressing   Problem: Clinical Measurements: Goal: Ability to maintain clinical measurements within normal limits will improve Outcome: Adequate for Discharge Goal: Will remain free from infection Outcome: Adequate for Discharge Goal: Diagnostic test results will improve Outcome: Adequate for Discharge Goal: Respiratory complications will improve Outcome: Adequate for Discharge Goal: Cardiovascular complication will be avoided Outcome: Adequate for Discharge   Problem: Activity: Goal: Risk for activity intolerance will decrease Outcome: Adequate for Discharge   Problem: Nutrition: Goal: Adequate nutrition will be maintained Outcome: Adequate for Discharge   Problem: Coping: Goal: Level of anxiety will decrease Outcome: Adequate for Discharge   Problem: Elimination: Goal: Will not experience complications related to bowel motility Outcome: Adequate for Discharge Goal: Will not experience complications related to urinary retention Outcome: Adequate for Discharge   Problem: Pain Managment: Goal: General experience of comfort will improve Outcome: Adequate for Discharge   Problem: Safety: Goal: Ability to remain free from injury will improve Outcome: Adequate for Discharge   Problem: Skin Integrity: Goal: Risk for impaired skin integrity will decrease Outcome: Adequate for Discharge

## 2019-07-20 NOTE — TOC Initial Note (Signed)
Transition of Care [facility]) - Initial/Assessment Note    Patient Details  Name: [name] MRN: [id number] Date of Birth: 14-May-1931  Transition of [facility] CM/SW Contact:    Ihor Gully, LCSW Phone Number: 07/20/2019, 4:34 PM  Clinical Narrative:                 Patient deferred to her daughter, [name], to provide history. Patient lives with [name] and is active with [facility],OT. Patient ambulates with a walker and is independent in ADLs. Patient has a BSC, [facility] and a relative gave them a hospital bed today.  TOC will follow through discharge to address needs for safe discharge plan.   Expected Discharge Plan: [place] Barriers to Discharge: No Barriers Identified   Patient Goals and CMS Choice     Choice offered to / list presented to : Patient  Expected Discharge Plan and Services Expected Discharge Plan: [place]                                              Prior Living Arrangements/Services                  Current home services: DME, Home PT, Home OT    Activities of Daily Living Home Assistive Devices/Equipment: CBG Meter, Environmental consultant (specify type), Dentures (specify type), Eyeglasses, Hearing aid ADL Screening (condition at time of admission) Patient's cognitive ability adequate to safely complete daily activities?: No Is the patient deaf or have difficulty hearing?: Yes Does the patient have difficulty seeing, even when wearing glasses/contacts?: Yes Does the patient have difficulty concentrating, remembering, or making decisions?: No Patient able to express need for assistance with ADLs?: Yes Does the patient have difficulty dressing or bathing?: No Independently performs ADLs?: Yes (appropriate for developmental age) Communication: Independent Dressing (OT): Independent Grooming: Independent Feeding: Independent Bathing: Independent Toileting: Independent In/Out Bed: Independent Walks in Home:  Independent with device (comment) Does the patient have difficulty walking or climbing stairs?: Yes Weakness of Legs: Both Weakness of Arms/Hands: Both  Permission Sought/Granted                  Emotional Assessment     Affect (typically observed): Accepting Orientation: : Oriented to Self, Oriented to Place, Oriented to  Time, Oriented to Situation Alcohol / Substance Use: Not Applicable    Admission diagnosis:  Shortness of breath [R06.02] Hyperkalemia [E87.5] Atrial fibrillation with RVR ([place]) [I48.91] Patient Active Problem List   Diagnosis Date Noted  . New onset a-fib ([place]) 07/18/2019  . Nocturnal enuresis 07/03/2019  . Mixed stress and urge urinary incontinence 07/03/2019  . Generalized weakness 06/28/2019  . Symptomatic bradycardia 06/28/2019  . Apnea 02/15/2019  . Snoring 02/15/2019  . Polyp of colon   . Accelerated hypertension 01/20/2019  . Rectal bleeding 11/08/2018  . Rectal pain 11/08/2018  . Abnormal CT scan, colon 11/08/2018  . (HFpEF) heart failure with preserved ejection fraction ([place]) 10/09/2018  . Acute on chronic diastolic CHF (congestive heart failure) ([place]) 10/09/2018  . Aortic stenosis, moderate 10/09/2018  . Mitral valve stenosis 10/09/2018  . Acute exacerbation of CHF (congestive heart failure) ([place]) 10/07/2018  . Acute hypoxemic respiratory failure ([place]) 10/07/2018  . Chronic anemia 10/07/2018  . Asthma 10/07/2018  . CVA (cerebral vascular accident) ([place]) 10/07/2018  . CAD (coronary artery disease)  10/07/2018  . Constipation 10/07/2018  . Physical deconditioning 10/07/2018  . History of recurrent UTIs 10/07/2018  . CKD (chronic kidney disease) stage 3, GFR 30-59 ml/min (HCC) 12/22/2017  . Dyslipidemia 12/22/2017  . Weakness 11/23/2017  . Overweight (BMI 25.0-29.9) 05/08/2016  . UTI (lower urinary tract infection) 11/12/2015  . DM hyperosmolarity type II, uncontrolled ([place]) 11/12/2015  . Dementia ([place])   . Encephalopathy, metabolic  27/06/8674  . S/P right TKA 08/13/2015  . S/P knee replacement 08/13/2015  . Elevated troponin 03/06/2015  . Fever   . History of diabetes mellitus   . Left buttock pain   . Mixed hyperlipidemia 01/11/2015  . GAD (generalized anxiety disorder) 01/11/2015  . Depression 01/11/2015  . GERD (gastroesophageal reflux disease) 01/11/2015  . Osteopenia 07/13/2014  . Obesity (BMI 30-39.9) 06/19/2014  . Carotid stenosis   . Anemia of chronic disease 12/25/2011  . Iron deficiency anemia 05/27/2011  . Diastolic CHF, chronic ([place]) 04/02/2010  . Type 2 diabetes mellitus with insulin therapy ([place]) 10/08/2009  . HLD (hyperlipidemia) 10/08/2009  . HTN (hypertension) 10/08/2009  . Coronary atherosclerosis 10/08/2009  . CAROTID STENOSIS 10/08/2009   PCP:  [name], MD Pharmacy:   [facility], [place] [address] [place] [address] Phone: [phone] Fax: [place], [place] [address] Phone: [phone] Fax: [phone]     Social Determinants of Health (SDOH) Interventions    Readmission Risk Interventions No flowsheet data found.

## 2019-07-20 NOTE — Evaluation (Signed)
Physical Therapy Evaluation Patient Details Name: [name] MRN: [id number] DOB: [birth year] Today's Date: 07/20/2019   History of Present Illness  85 y.o female with an extensive PMH including CKD,CHF, Asthma presents with a chief complaint of abdominal and shortness of breath since last night.  History was mainly provided by daughter at the bedside due to patient's mental status.  She reports patient this morning appear to be short of breath, holding onto her stomach, bending over on her chair.  Patient does report she feels a left-sided not which is painful to touch along the left lower quadrant.  According to daughter patient had been constipated but had a large bowel movement yesterday after 2 doses of milk of magnesia.  Daughter reports patient was recently hospitalized and had changes in medications, she was taken off for medications, she is unsure which ones these are but knows that 1 of them was methenamine 1 g which was prescribed by Dr. Cletus Gash and states this medication was really helping patient.  Patient does report a sensation of urgency, reports after urination she does not feel like her bladder has fully empty.  Denies any fever, vomiting, dizziness, chest pain.     Clinical Impression  Patient able to perform bed mobilities with supervision and verbal cues. Incontinent with standing. Patient able to maintain O2 sats at 95-100% on room air with standing, and around 95% with ambulation. Min guard assist with transitional mobility and ambulation with RW. Patient eager to get home and reports family is able to take care of her. Patient would benefit from skilled physical therapy while in hospital to improve functional strength and ambulatory mobility.     Follow Up Recommendations Home health PT;Supervision/Assistance - 24 hour    Equipment Recommendations  None recommended by PT    Recommendations for Other Services       Precautions / Restrictions Precautions Precautions:  Fall Restrictions Weight Bearing Restrictions: No Other Position/Activity Restrictions: pt incontinent of urine with activity      Mobility  Bed Mobility Overal bed mobility: Needs Assistance Bed Mobility: Supine to Sit     Supine to sit: Supervision;Min guard     General bed mobility comments: slow movements, verbal cues  Transfers Overall transfer level: Needs assistance Equipment used: Rolling walker (2 wheeled) Transfers: Sit to/from [facility] Sit to Stand: Supervision Stand pivot transfers: Min guard       General transfer comment: stands with flexed posture, verbal cues to stand upright and to stay within her walker, incontient with sit to stand  Ambulation/Gait Ambulation/Gait assistance: Min guard Gait Distance (Feet): 25 Feet Assistive device: Rolling walker (2 wheeled) Gait Pattern/deviations: Trunk flexed;Decreased step length - right;Decreased step length - left;Shuffle Gait velocity: slow/decreased   General Gait Details: pushes walker away from her, verbal cues to stay in walker helps correct this. small steps, challenging for patient.  Stairs            Wheelchair Mobility    Modified Rankin (Stroke Patients Only)       Balance Overall balance assessment: Needs assistance;History of Falls             Standing balance comment: forward/flexed posture                             Pertinent Vitals/Pain Pain Assessment: Faces Faces Pain Scale: Hurts even more Pain Location: abdomen front and lower back Pain Descriptors / Indicators: Discomfort;Aching  Home Living Family/patient expects to be discharged to:: Private residence Living Arrangements: Children Available Help at Discharge: Family Type of Home: House Home Access: Stairs to enter   Technical brewer of Steps: 2 - son is building her a ramp Home Layout: One level Home Equipment: Environmental consultant - 4 wheels Additional Comments: pt reports she is  getting a hospital bed    Prior Function Level of Independence: Independent with assistive device(s)         Comments: pt reports she doesn't get into shower     Hand Dominance        Extremity/Trunk Assessment        Lower Extremity Assessment Lower Extremity Assessment: Generalized weakness    Cervical / Trunk Assessment Cervical / Trunk Assessment: Kyphotic  Communication   Communication: Expressive difficulties  Cognition Arousal/Alertness: Awake/alert Behavior During Therapy: WFL for tasks assessed/performed Overall Cognitive Status: Within Functional Limits for tasks assessed                                        General Comments      Exercises     Assessment/Plan    PT Assessment Patient needs continued PT services  PT Problem List Decreased strength;Decreased mobility;Decreased safety awareness;Decreased activity tolerance;Cardiopulmonary status limiting activity;Decreased balance;Decreased knowledge of use of DME       PT Treatment Interventions Therapeutic activities;Gait training;Therapeutic exercise;Patient/family education;Balance training;Functional mobility training    PT Goals (Current goals can be found in the Care Plan section)  Acute Rehab PT Goals Patient Stated Goal: to get back home and get stronger PT Goal Formulation: With patient Time For Goal Achievement: 08/03/19 Potential to Achieve Goals: Fair    Frequency Min 3X/week   Barriers to discharge        Co-evaluation               AM-PAC PT "6 Clicks" Mobility  Outcome Measure Help needed turning from your back to your side while in a flat bed without using bedrails?: A Little Help needed moving from lying on your back to sitting on the side of a flat bed without using bedrails?: A Little Help needed moving to and from a bed to a chair (including a wheelchair)?: A Little Help needed standing up from a chair using your arms (e.g., wheelchair or bedside  chair)?: A Little Help needed to walk in hospital room?: A Little Help needed climbing 3-5 steps with a railing? : A Lot 6 Click Score: 17    End of Session Equipment Utilized During Treatment: Gait belt Activity Tolerance: Patient limited by fatigue;Patient limited by pain Patient left: in chair;with chair alarm set;with call bell/phone within reach Nurse Communication: Mobility status;Other (comment)(o2 sats 95-98 on room air with activity, pain in abdomen) PT Visit Diagnosis: Unsteadiness on feet (R26.81);Repeated falls (R29.6);Muscle weakness (generalized) (M62.81);Difficulty in walking, not elsewhere classified (R26.2)    Time: 1130-1203 PT Time Calculation (min) (ACUTE ONLY): 33 min   Charges:   PT Evaluation $PT Eval Moderate Complexity: 1 Mod PT Treatments $Therapeutic Activity: 23-37 mins        Jerene Pitch, DPT Physical Therapy with [facility]  [phone] office 07/20/2019, 12:40 PM

## 2019-07-20 NOTE — Telephone Encounter (Signed)
New message:    Patient daughter recurring call back and up dated her numbers.

## 2019-07-21 LAB — GLUCOSE, CAPILLARY
Glucose-Capillary: 70 mg/dL (ref 70–99)
Glucose-Capillary: 160 mg/dL — ABNORMAL HIGH (ref 70–99)
Glucose-Capillary: 123 mg/dL — ABNORMAL HIGH (ref 70–99)
Glucose-Capillary: 191 mg/dL — ABNORMAL HIGH (ref 70–99)
Glucose-Capillary: 117 mg/dL — ABNORMAL HIGH (ref 70–99)

## 2019-07-21 MED ORDER — FUROSEMIDE 40 MG PO TABS
40.0000 mg | ORAL_TABLET | Freq: Every day | ORAL | Status: DC
  Administered 2019-07-22 – 2019-07-23 (×2): 40 mg via ORAL
  Filled 2019-07-21 (×2): qty 1

## 2019-07-21 MED ORDER — HYDRALAZINE HCL 25 MG PO TABS
25.0000 mg | ORAL_TABLET | Freq: Three times a day (TID) | ORAL | Status: DC
  Administered 2019-07-21 – 2019-07-25 (×12): 25 mg via ORAL
  Filled 2019-07-21 (×12): qty 1

## 2019-07-21 MED ORDER — INSULIN DETEMIR 100 UNIT/ML ~~LOC~~ SOLN
10.0000 [IU] | Freq: Every morning | SUBCUTANEOUS | Status: DC
  Administered 2019-07-22 – 2019-07-25 (×4): 10 [IU] via SUBCUTANEOUS
  Filled 2019-07-21 (×7): qty 0.1

## 2019-07-21 MED ORDER — DEXTROSE-NACL 5-0.45 % IV SOLN
INTRAVENOUS | Status: DC
  Administered 2019-07-21: 13:00:00 via INTRAVENOUS

## 2019-07-21 NOTE — Care Management Important Message (Signed)
Important Message  Patient Details  Name: [name] MRN: [id number] Date of Birth: 11/07/1931   Medicare Important Message Given:  Yes     Ihor Gully, LCSW 07/21/2019, 3:00 PM

## 2019-07-21 NOTE — Progress Notes (Signed)
   I have been notified by cardiology service that it is unlikely that patient will get a pacemaker on 07/21/2019  --So patient may have a diet today and be n.p.o. after midnight  -IV fluids discontinued as patient is no longer n.p.o. --am Insulin dosage adjusted as patient will be n.p.o. after midnight  Roxan Hockey, MD

## 2019-07-21 NOTE — Plan of Care (Signed)
  Problem: Education: Goal: Knowledge of General Education information will improve Description Including pain rating scale, medication(s)/side effects and non-pharmacologic comfort measures Outcome: Progressing   Problem: Health Behavior/Discharge Planning: Goal: Ability to manage health-related needs will improve Outcome: Progressing   

## 2019-07-21 NOTE — Progress Notes (Signed)
Report given to [facility] at this time 

## 2019-07-21 NOTE — Progress Notes (Signed)
Physical Therapy Treatment Patient Details Name: [name] MRN: [id number] DOB: 1931-07-28 Today's Date: 07/21/2019    History of Present Illness 85 y.o female with an extensive PMH including CKD,CHF, Asthma presents with a chief complaint of abdominal and shortness of breath since last night.  History was mainly provided by daughter at the bedside due to patient's mental status.  She reports patient this morning appear to be short of breath, holding onto her stomach, bending over on her chair.  Patient does report she feels a left-sided not which is painful to touch along the left lower quadrant.  According to daughter patient had been constipated but had a large bowel movement yesterday after 2 doses of milk of magnesia.  Daughter reports patient was recently hospitalized and had changes in medications, she was taken off for medications, she is unsure which ones these are but knows that 1 of them was methenamine 1 g which was prescribed by Dr. Cletus Gash and states this medication was really helping patient.  Patient does report a sensation of urgency, reports after urination she does not feel like her bladder has fully empty.  Denies any fever, vomiting, dizziness, chest pain.    PT Comments    Patient tolerated session well and able to perform bed mobilities and transfers with supervision. Incontinent with standing, transitioned patient to commode prior to ambulation. Improved use of walker with ambulation today (able to stay within walker instead of pushing it out in front of her). Continued pain and fatigue noted with about 25 feet of ambulation. O2 consistently 97 or above during ambulation and HR between  56-62 during ambulation. Patient would continue to benefit from skilled physical therapy throughout course of stay in hospital to work on functional endurance and strength.    Follow Up Recommendations  Home health PT;Supervision/Assistance - 24 hour     Equipment Recommendations  None  recommended by PT    Recommendations for Other Services       Precautions / Restrictions Precautions Precautions: Fall Restrictions Weight Bearing Restrictions: No Other Position/Activity Restrictions: pt incontinent of urine with activity    Mobility  Bed Mobility Overal bed mobility: Needs Assistance Bed Mobility: Supine to Sit     Supine to sit: Supervision     General bed mobility comments: slow movements, verbal cues  Transfers Overall transfer level: Needs assistance Equipment used: Rolling walker (2 wheeled) Transfers: Sit to/from [employer] Transfers Sit to Stand: Supervision Stand pivot transfers: Supervision(verbal cues)       General transfer comment: stands with flexed posture, verbal cues to stand upright and to stay within her walker, incontient with sit to stand --> transferred to commode  Ambulation/Gait Ambulation/Gait assistance: Min guard Gait Distance (Feet): 25 Feet Assistive device: Rolling walker (2 wheeled) Gait Pattern/deviations: Trunk flexed;Decreased step length - right;Decreased step length - left;Shuffle Gait velocity: slow/decreased   General Gait Details: improved ability to stay in walker today, verbal cues to stand upright, difficulty walking, mild dizziness reported. O2 Sats >97% throughout, HR between 56-62   Stairs             Wheelchair Mobility    Modified Rankin (Stroke Patients Only)       Balance Overall balance assessment: Needs assistance;History of Falls             Standing balance comment: forward/flexed posture  Cognition Arousal/Alertness: Awake/alert Behavior During Therapy: WFL for tasks assessed/performed Overall Cognitive Status: Within Functional Limits for tasks assessed                                        Exercises General Exercises - Lower Extremity Long Arc Quad: Seated;AROM;Strengthening;Both;10 reps Hip  Flexion/Marching: Seated;AROM;Strengthening;Both;10 reps Toe Raises: Seated;AROM;Both;10 reps Heel Raises: Seated;AROM;Strengthening;Both;10 reps    General Comments        Pertinent Vitals/Pain Pain Assessment: Faces Faces Pain Scale: Hurts even more Pain Descriptors / Indicators: Aching Pain Intervention(s): Limited activity within patient's tolerance    Home Living Family/patient expects to be discharged to:: Private residence Living Arrangements: Children Available Help at Discharge: Family Type of Home: House Home Access: Stairs to enter   Home Layout: One level Home Equipment: Environmental consultant - 4 wheels Additional Comments: pt reports she is getting a hospital bed    Prior Function Level of Independence: Independent with assistive device(s)      Comments: pt reports she doesn't get into shower   PT Goals (current goals can now be found in the care plan section) Acute Rehab PT Goals Patient Stated Goal: to get back home and get stronger PT Goal Formulation: With patient Time For Goal Achievement: 08/03/19 Potential to Achieve Goals: Fair    Frequency    Min 3X/week      PT Plan      Co-evaluation              AM-PAC PT "6 Clicks" Mobility   Outcome Measure  Help needed turning from your back to your side while in a flat bed without using bedrails?: None Help needed moving from lying on your back to sitting on the side of a flat bed without using bedrails?: None Help needed moving to and from a bed to a chair (including a wheelchair)?: A Little Help needed standing up from a chair using your arms (e.g., wheelchair or bedside chair)?: A Little Help needed to walk in hospital room?: A Little Help needed climbing 3-5 steps with a railing? : A Lot 6 Click Score: 19    End of Session Equipment Utilized During Treatment: Gait belt Activity Tolerance: Patient limited by fatigue;Patient limited by pain Patient left: in chair;with chair alarm set;with call  bell/phone within reach Nurse Communication: Other (comment)(o2 sats 95-98 on room air with activity, pain in abdomen) PT Visit Diagnosis: Unsteadiness on feet (R26.81);Repeated falls (R29.6);Muscle weakness (generalized) (M62.81);Difficulty in walking, not elsewhere classified (R26.2)     Time: 0355-9741 PT Time Calculation (min) (ACUTE ONLY): 24 min  Charges:  $Therapeutic Exercise: 8-22 mins $Therapeutic Activity: 8-22 mins                        Jerene Pitch, DPT Physical Therapy with [facility]  [phone] office 07/21/2019, 12:21 PM

## 2019-07-21 NOTE — Progress Notes (Addendum)
Progress Note  Patient Name: [name] Date of Encounter: 07/21/2019  Primary Cardiologist: Minus Breeding, MD   Subjective   Says she did not sleep well overnight. Felt her heart racing this morning. Denies any symptoms at this time. Ambulated in the hallway with PT yesterday.  Inpatient Medications    Scheduled Meds: . aspirin  81 mg Oral Q breakfast  . atorvastatin  20 mg Oral QHS  . clopidogrel  75 mg Oral Daily  . enoxaparin (LOVENOX) injection  30 mg Subcutaneous Q24H  . ferrous sulfate  324 mg Oral Daily  . furosemide  40 mg Intravenous Q12H  . hydrALAZINE  50 mg Oral TID  . insulin aspart  0-5 Units Subcutaneous QHS  . insulin aspart  0-9 Units Subcutaneous TID WC  . insulin detemir  24 Units Subcutaneous q morning - 10a  . metoprolol tartrate  25 mg Oral BID  . pantoprazole  40 mg Oral Daily  . PARoxetine  20 mg Oral q morning - 10a  . polyethylene glycol  17 g Oral Daily  . sodium chloride flush  3 mL Intravenous Q12H   Continuous Infusions: . sodium chloride    . cefTRIAXone (ROCEPHIN)  IV 1 g (07/21/19 0835)   PRN Meds: sodium chloride, acetaminophen **OR** acetaminophen, albuterol, hydrALAZINE, ondansetron **OR** ondansetron (ZOFRAN) IV, polyethylene glycol, sodium chloride flush, traZODone   Vital Signs    Vitals:   07/20/19 1442 07/20/19 2119 07/21/19 0458 07/21/19 0500  BP: (!) 114/59 102/72 102/74   Pulse: 60 (!) 110 (!) 108   Resp: 19 19 17    Temp: 98.3 F (36.8 C) 98.4 F (36.9 C) 97.6 F (36.4 C)   TempSrc: Oral Oral Oral   SpO2: 97% 97% 99%   Weight:    67.9 kg  Height:        Intake/Output Summary (Last 24 hours) at 07/21/2019 0857 Last data filed at 07/21/2019 0646 Gross per 24 hour  Intake 720 ml  Output 1300 ml  Net -580 ml    Last 3 Weights 07/21/2019 07/20/2019 07/19/2019  Weight (lbs) 149 lb 11.1 oz 155 lb 10.3 oz 151 lb 3.8 oz  Weight (kg) 67.9 kg 70.6 kg 68.6 kg      Telemetry    Atrial fibrillation, HR in 90's to  110's. Occasional PVC's. Pauses up to 1.5 seconds.  - Personally Reviewed  ECG    No new tracings.   Physical Exam   General: Well developed, elderly Caucasian female appearing in no acute distress. Head: Normocephalic, atraumatic.  Neck: Supple without bruits, JVD not elevated. Lungs:  Resp regular and unlabored, mild rales along bases. Heart: Irregularly irregular, S1, S2, no S3, S4, 2/6 SEM along RUSB.  Abdomen: Soft, non-tender, non-distended with normoactive bowel sounds. No hepatomegaly. No rebound/guarding. No obvious abdominal masses. Extremities: No clubbing, cyanosis, or lower extremity edema. Distal pedal pulses are 2+ bilaterally. Neuro: Alert and oriented X 3. Moves all extremities spontaneously. Psych: Normal affect.  Labs    Chemistry Recent Labs  Lab 07/18/19 1004 07/19/19 0434 07/20/19 0812  NA 137 138 137  K 5.2* 4.8 4.5  CL 103 103 101  CO2 24 28 27   GLUCOSE 171* 132* 84  BUN 37* 38* 42*  CREATININE 1.75* 1.64* 1.63*  CALCIUM 9.3 9.3 9.6  PROT 6.7  --   --   ALBUMIN 3.5  --   --   AST 17  --   --   ALT 17  --   --  ALKPHOS 41  --   --   BILITOT 0.7  --   --   GFRNONAA 26* 28* 28*  GFRAA 30* 33* 33*  ANIONGAP 10 7 9      Hematology Recent Labs  Lab 07/18/19 1004 07/19/19 0434  WBC 6.4 5.1  RBC 3.18* 3.08*  HGB 9.2* 9.1*  HCT 30.2* 29.6*  MCV 95.0 96.1  MCH 28.9 29.5  MCHC 30.5 30.7  RDW 14.3 14.1  PLT 217 200    Cardiac EnzymesNo results for input(s): TROPONINI in the last 168 hours. No results for input(s): TROPIPOC in the last 168 hours.   BNP Recent Labs  Lab 07/18/19 1004  BNP 283.0*     DDimer No results for input(s): DDIMER in the last 168 hours.   Radiology    No results found.  Cardiac Studies   Echocardiogram: 06/29/2019 IMPRESSIONS   1. The left ventricle has normal systolic function with an ejection fraction of 60-65%. The cavity size was normal. There is moderate concentric left ventricular hypertrophy.  Left ventricular diastolic Doppler parameters are consistent with  pseudonormalization. Elevated mean left atrial pressure.  2. The right ventricle has normal systolic function. The cavity was normal. There is no increase in right ventricular wall thickness. Right ventricular systolic pressure is mildly elevated with an estimated pressure of 36.6 mmHg.  3. Left atrial size was severely dilated.  4. The mitral valve is degenerative. Mild thickening of the mitral valve leaflet. Mild calcification of the mitral valve leaflet. There is severe mitral annular calcification present. Mitral valve regurgitation is mild to moderate by color flow Doppler.  Moderate mitral valve stenosis.  5. The aortic valve is tricuspid. Moderate thickening of the aortic valve. Moderate calcification of the aortic valve. Moderate stenosis of the aortic valve.  6. The aorta is normal in size and structure.  7. The inferior vena cava was dilated in size with >50% respiratory variability.  8. When compared to the prior study: 10/08/2018, there is very slight worsening of the aortic stenosis. Mitral valve gradients appear lower due to bradycardia.   Patient Profile     85 y.o. female w/ PMH of CAD (s/p CABG in 2006), carotid artery stenosis, aortic stenosis, HTN, HLD, and Type 2 DM, Stage 3 CKD, and prior CVA who is currently admitted for a CHF exacerbation and new-onset atrial fibrillation.   Assessment & Plan    1. New-Onset Atrial Fibrillation - this is a new diagnosis for the patient and she presented with worsening dyspnea. No specific chest pain or palpitations. Was admitted last month for junctional bradycardia and PTA Lopressor 50mg  BID was discontinued.  - rates have remained elevated and Lopressor was titrated to 25mg  BID yesterday. She is still tachycardiac with HR in the 90's to 110's with minimal activity. Hesitant to further titrate BB therapy for is she goes back into sinus rhythm, she had a high recurrence of  significant pauses or recurrent bradycardia as demonstrated last admission. Will discuss with Dr. Lambert Mody in regards to continuing her current regimen vs. transfer to [facility] for EP evaluation for tachy-brady syndrome and consideration of PPM placement. Giver she was symptomatic with her elevated rates this AM, suspect EP referral would be preferred.   -This patients CHA2DS2-VASc Score and unadjusted Ischemic Stroke Rate (% per year) is equal to10.8 % stroke rate/year from a score of 8(HTN, DM, Vascular, Female, Age (2), CVA (2)). Not a good candidate for NOAC's given Valvular Heart Disease and unsure how she would  do on Coumadin given her hematochezia earlier this year along with known internal hemorrhoids and diverticulosis. The patient is unsure if she would want to be on anticoagulation and has deferred this decision to her daughter. I have tried calling her daughter multiple times over the past few days to discuss anticoagulation but have been unable to reach her. Will try again today and update on treatment plan.   2. Acute on Chronic Diastolic CHF - likely secondary to atrial fibrillation with RVR.  - she has been receiving IV Lasix 40mg  BID with a recorded net output of -2.9 L thus far. Weight has declined, at 149 lbs this AM which is lower than her prior baseline of 155 lbs during her recent admission. Received IV Lasix 40mg  this AM. Volume status appears close to baseline and weight is below previous baseline. Will stop IV Lasix and switch to PO Lasix 40mg  daily starting tomorrow.   3. CAD - s/p CABG in 2006with recent NST last admission showingno significant reversible ischemia and was overall a low-risk study. - continue PTA ASA, Plavix, and statin therapy. Presumably on Plavix at this time from a Neurology perspective given multiple CVA's as she has not required any recent PCI.   4. Valvular Heart Disease - recent echo last month showed moderate mitral stenosis,mild to  moderatemitral regurgitation andmoderate AS as outlined above. Will continue to follow on an outpatient basis.   5. HTN - BP has been low-normal at 102/59 - 114/74 within the past 24 hours.  - will reduce Hydralazine from 50mg  TID to 25mg  TID. PTA Amlodipine held to allow for further titration of AV nodal blocking agents.   6. Stage 3 CKD - baseline creatinine 1.3 - 1.4. At 1.75 on admission and stable at 1.63 when checked on 07/20/2019.  7. UTI - Urine Culture positive for E. Coli. Remains on Rocephin. WBC improved to 5.1 on most recent check.    Signed, Erma Heritage , PA-C 8:57 AM 07/21/2019 Pager: [phone]  The patient was seen and examined, and I agree with the history, physical exam, assessment and plan as documented above.  Heart rates remain elevated at rest in spite of increase of Lopressor to 25 mg twice daily.  She previously experienced junctional bradycardia and long pauses with 50 mg twice daily which is why it was discontinued during her last hospitalization.  I am not inclined to titrate Lopressor to 37.5 mg twice daily over the weekend at [facility] without cardiology involvement.  We will transfer to [facility] so that she can be evaluated by EP for tachycardia-bradycardia syndrome so as to assess her pacemaker candidacy.  She appears euvolemic.  IV Lasix will be discontinued and oral Lasix will be started on 07/22/2019.  Kate Sable, MD, [facility]  07/21/2019 10:02 AM

## 2019-07-21 NOTE — Progress Notes (Signed)
     Called the patient's daughter and talked with her for over 25 minutes about the patient's current hospitalization and plan of care. She was in agreement for the patient to go to [facility] for EP evaluation and consideration of PPM placement. She asks to be updated with any changes in plan of care and I have made the EP APP aware of this. Contact information has been updated in the chart and she prefers to be reached at [phone].   We discussed risks and benefits of anticoagulation and she is unable to give a definitive answer at this time. Reviewed her increased CVA/TE risk but also concerns regarding her chronic anemia, recent hematochezia, and internal hemorrhoids. I encouraged her to further address with her mom and sisters. At this time, she does not feel like she would want her on Coumadin but says she would mostly value Dr. Rosezella [place] opinion as he has followed her mom for several years, however he is not in the hospital this week. Will route to make him aware. I also informed her this will again be addressed prior to discharge and to make [place] aware if she reaches a definitive decision.   Signed, Erma Heritage, PA-C 07/21/2019, 10:54 AM Pager: [phone]

## 2019-07-21 NOTE — Progress Notes (Signed)
Patient Demographics:    [name], is a 85 y.o. female, DOB - August 02, 1931, NWG:[id number]  Admit date - 07/18/2019   Admitting Physician Dyanna Seiter Denton Brick, MD  Outpatient Primary MD for the patient is [name], MD  LOS - 2   Chief Complaint  Patient presents with  . Abdominal Pain        Subjective:    [name] today has no fevers, no emesis,  No chest pain,  --Had episodes of palpitations and dizziness overnight patient states she had a near syncopal event   Assessment  & Plan :    Principal Problem:   New onset a-fib ([place]) Active Problems:   (HFpEF) heart failure with preserved ejection fraction (HCC)   Type 2 diabetes mellitus with insulin therapy (HCC)   HTN (hypertension)   Anemia of chronic disease   CAD (coronary artery disease)  Brief Summary:- 85 y.o. female with PMHx relevant for  CAD s/p CABG (2006), carotic artery stenosis, HTN with hx of hypertensive urgency, CKD stage III, DM, dyslipidemia, GERD, and chronic diastolic heart failure, prior CVA with residual slurred speech,  aortic stenosis and Mitral stenosis, CKD 3, asthma, hypertension and diabetes recent history of bradycardia with junctional bradycardia readmitted on 07/18/2019 with tachyarrhythmia/new onset A. fib shortness of breath---  -Transfer to [facility] on 07/21/19 for EP evaluation for possible tachybradycardia syndrome and possible pacemaker placement  A/p 1)Tachyarrhythmia/New onset A. Fib----recent episode of prolonged symptomatic bradycardia with heart rate in the 40s(junctional/ectopic atrial rhythm) -This is all in setting of Moderate  aortic stenosis and Moderate Mitral stenosis -Metoprolol was discontinued on 06/28/2019 - cardiology input appreciated,  - started on Metoprolol 12.5 mg bid on 07/19/2019,  -Tachycardia persist so metoprolol being increased to 25 mg twice daily on 07/20/2019 ,  symptomatic tachycardia persist --Dr. Bronson Ing does not feel comfortable titrating metoprolol beyond 25 mg twice daily given recent history of significant/symptomatic bradycardia/pauses-- -Transfer to [facility] on 07/21/19 for EP evaluation for possible tachybradycardia syndrome and possible pacemaker placement -  CHA2DS2- VASc score   is = 7(CHF, age x2, CVA x 2, HTN, Female),   Which is  equal to =  11.2 % annual risk of stroke  This patients CHA2DS2-VASc Score and unadjusted Ischemic Stroke Rate (% per year) is equal to 11.2 % stroke rate/year from a score of 7  However patient has had recent hematochezia in March 2020 presumably from bleeding internal hemorrhoids, please see full colonoscopy report -History of recurrent falls due to bradycardia/arrhythmias -Also patient is currently on aspirin and Plavix for history of CAD and CVA --High risk of bleeding --Defer decision on full anticoagulation/Coumadin therapy to cardiology team and pt's daughter -Not a candidate for NOAC due to moderate mitral stenosis (will have to be on Coumadin) -Discussed with patient's daughter (ms [name]) risk Versus benefits of anticoagulation and she would like to talk to cardiology (Dr Alba Cory) before making a final decision  2) HFpEF/ Chronic diastolic heart failure---  acute on chronic diastolic CHF exacerbation in the setting of tachyarrhythmia and moderate aortic stenosis and moderate mitral stenosis---  -Hold Lasix as patient will be n.p.o. for possible EP evaluation C/n daily weights, input and output charting, -Troponins not elevated -EF is 60 to 65% per  echo from 06/26/2019 2020 -Fluid balance is negative -Weight is down to 149 pounds from 163 pounds on 07/18/2019  3)CAD--- status post prior coronary angioplasty with 5stentsprior to 2005,CABG 2006,follows with Dr. Michell Heinrich is not elevated, no ACS type symptoms, continue aspirin, continue Lipitor, continue Plavix,  -Cardiology input  appreciated metoprolol being titrated as above in #1  4)AKI----acute kidney injury on CKD stage - III--suspect worsening cardiac failure due to poor renal perfusion in the setting of CHF exacerbation and tachyarrhythmia    creatinine on admission= 1.75 ,   baseline creatinine = 1.3-1.4 ,  Creatinine is now 1.63, renally adjust medications, avoid nephrotoxic agents/dehydration/hypotension --Watch renal function closely with aggressive diuresis for CHF -Hold metformin -Lasix on hold today due to n.p.o. status  5)Social/Ethics--- plan of care discussed with patient and daughter ([name]), patient is a full code  6)Diabetes Mellitus- A1c 8.5 weeks ago weeks ago reflecting uncontrolled diabetes - -Hold Metformin due to kidney concerns -Decrease Levemir to 10 units from 24 units daily due to NPO status. Use Novolog/Humalog Sliding scale insulin with Accu-Cheks / Fingersticks as ordered   7)CVA-with residual speech deficit- patient mostly independent of her ADLs.  Continue aspirin and Lipitor  8)Chronic anemia-hemoglobin 9.1, which is close to patient's baseline -No evidence of active bleeding at this time  9)Moderate Aortic Stenosis and Moderate Mitral Stenosis--- ??  Echo from 06/29/2019 noted,  cardiology consult appreciated  10)Presumed UTI--- completed IV Rocephin, recent urine culture with pansensitive E. coli  Disposition/Need for in-Hospital Stay- patient unable to be discharged at this time due to some dyspnea on exertion, needs to monitor heart rate with initiation of beta-blocker-given recent severe bradycardia while on beta-blocker---  will need EP evaluation for tachy-brady syndrome and consideration of PPM placement.    Code Status : Full   Family Communication:    (patient is alert, awake and coherent) Daughter [name]   Disposition Plan  : Transfer to [facility] on 07/21/19 for EP evaluation for possible tachybradycardia syndrome and possible pacemaker placement   Consults  :  cardiology  DVT Prophylaxis  :  Lovenox   SCDs  -  Lab Results  Component Value Date   PLT 200 07/19/2019    Inpatient Medications  Scheduled Meds: . aspirin  81 mg Oral Q breakfast  . atorvastatin  20 mg Oral QHS  . clopidogrel  75 mg Oral Daily  . enoxaparin (LOVENOX) injection  30 mg Subcutaneous Q24H  . ferrous sulfate  324 mg Oral Daily  . [START ON 07/22/2019] furosemide  40 mg Oral Daily  . hydrALAZINE  25 mg Oral TID  . insulin aspart  0-5 Units Subcutaneous QHS  . insulin aspart  0-9 Units Subcutaneous TID WC  . [START ON 07/22/2019] insulin detemir  10 Units Subcutaneous q morning - 10a  . metoprolol tartrate  25 mg Oral BID  . pantoprazole  40 mg Oral Daily  . PARoxetine  20 mg Oral q morning - 10a  . polyethylene glycol  17 g Oral Daily  . sodium chloride flush  3 mL Intravenous Q12H   Continuous Infusions: . sodium chloride    . dextrose 5 % and 0.45% NaCl     PRN Meds:.sodium chloride, acetaminophen **OR** acetaminophen, albuterol, hydrALAZINE, ondansetron **OR** ondansetron (ZOFRAN) IV, polyethylene glycol, sodium chloride flush, traZODone   Anti-infectives (From admission, onward)   Start     Dose/Rate Route Frequency Ordered Stop   07/19/19 1000  cefTRIAXone (ROCEPHIN) 1 g in sodium chloride 0.9 %  100 mL IVPB     1 g 200 mL/hr over 30 Minutes Intravenous Every 24 hours 07/19/19 0946 07/21/19 0905        Objective:   Vitals:   07/20/19 2119 07/21/19 0458 07/21/19 0500 07/21/19 1207  BP: 102/72 102/74    Pulse: (!) 110 (!) 108  68  Resp: 19 17    Temp: 98.4 F (36.9 C) 97.6 F (36.4 C)    TempSrc: Oral Oral    SpO2: 97% 99%  97%  Weight:   67.9 kg   Height:        Wt Readings from Last 3 Encounters:  07/21/19 67.9 kg  06/30/19 91.2 kg  02/15/19 67.1 kg     Intake/Output Summary (Last 24 hours) at 07/21/2019 1238 Last data filed at 07/21/2019 0646 Gross per 24 hour  Intake 240 ml  Output 1300 ml  Net -1060 ml     Physical Exam  Gen:- Awake Alert, no shortness of breath at rest, dyspnea on exertion HEENT:- [place].AT, No sclera icterus Nose [place] 2 L/min (O2 sats was 88 to 90% on room air)  Neck-Supple Neck,No JVD,.  Lungs-improving air movement, faint bibasilar rales  CV- S1, S2 normal, irregularly irregular, 2/6 SM, prior sternotomy scar Abd-  +ve B.Sounds, Abd Soft, No tenderness,    Extremity/Skin:- trace  edema, pedal pulses present  Psych-affect is appropriate, oriented x3 Neuro-speech deficit which is not new, no new focal deficits, no tremors   Data Review:   Micro Results Recent Results (from the past 240 hour(s))  SARS Coronavirus 2 [facility] order, Performed in [facility] hospital lab) Nasopharyngeal Nasopharyngeal Swab     Status: None   Collection Time: 07/18/19 12:26 PM   Specimen: Nasopharyngeal Swab  Result Value Ref Range Status   SARS Coronavirus 2 NEGATIVE NEGATIVE Final    Comment: (NOTE) If result is NEGATIVE SARS-CoV-2 target nucleic acids are NOT DETECTED. The SARS-CoV-2 RNA is generally detectable in upper and lower  respiratory specimens during the acute phase of infection. The lowest  concentration of SARS-CoV-2 viral copies this assay can detect is 250  copies / mL. A negative result does not preclude SARS-CoV-2 infection  and should not be used as the sole basis for treatment or other  patient management decisions.  A negative result may occur with  improper specimen collection / handling, submission of specimen other  than nasopharyngeal swab, presence of viral mutation(s) within the  areas targeted by this assay, and inadequate number of viral copies  (<250 copies / mL). A negative result must be combined with clinical  observations, patient history, and epidemiological information. If result is POSITIVE SARS-CoV-2 target nucleic acids are DETECTED. The SARS-CoV-2 RNA is generally detectable in upper and lower  respiratory specimens dur ing the acute phase of  infection.  Positive  results are indicative of active infection with SARS-CoV-2.  Clinical  correlation with patient history and other diagnostic information is  necessary to determine patient infection status.  Positive results do  not rule out bacterial infection or co-infection with other viruses. If result is PRESUMPTIVE POSTIVE SARS-CoV-2 nucleic acids MAY BE PRESENT.   A presumptive positive result was obtained on the submitted specimen  and confirmed on repeat testing.  While 2019 novel coronavirus  (SARS-CoV-2) nucleic acids may be present in the submitted sample  additional confirmatory testing may be necessary for epidemiological  and / or clinical management purposes  to differentiate between  SARS-CoV-2 and other Sarbecovirus currently known to  infect humans.  If clinically indicated additional testing with an alternate test  methodology [phone]) is advised. The SARS-CoV-2 RNA is generally  detectable in upper and lower respiratory sp ecimens during the acute  phase of infection. The expected result is Negative. Fact Sheet for Patients:  StrictlyIdeas.no Fact Sheet for Healthcare Providers: BankingDealers.co.za This test is not yet approved or cleared by the [place] FDA and has been authorized for detection and/or diagnosis of SARS-CoV-2 by FDA under an Emergency Use Authorization (EUA).  This EUA will remain in effect (meaning this test can be used) for the duration of the COVID-19 declaration under Section 564(b)(1) of the Act, 21 U.S.C. section 360bbb-3(b)(1), unless the authorization is terminated or revoked sooner. Performed at [facility], [address] [place], [place] [address]     Radiology Reports Dg Chest 2 View  Result Date: 07/18/2019 CLINICAL DATA:  Short of breath EXAM: CHEST - 2 VIEW COMPARISON:  06/30/2019 FINDINGS: Postop CABG. Interval development of diffuse bilateral airspace disease with the  appearance of edema. Interval development of small pleural effusion. Mild atelectasis in the bases. Severe lumbar compression fracture of L1 appears chronic and unchanged from prior studies. IMPRESSION: Diffuse bilateral airspace disease with small effusion consistent with congestive heart failure and edema. Electronically Signed   By: Franchot Gallo M.D.   On: 07/18/2019 11:15   Dg Chest 2 View  Result Date: 06/30/2019 CLINICAL DATA:  Chest pain. EXAM: CHEST - 2 VIEW COMPARISON:  Radiographs of June 28, 2019. FINDINGS: Stable cardiomegaly. Status post coronary bypass graft. Atherosclerosis of thoracic aorta is noted. No pneumothorax is noted. Left lung is clear. Minimal right basilar subsegmental atelectasis is noted with minimal right pleural effusion. The visualized skeletal structures are unremarkable. IMPRESSION: Minimal right basilar subsegmental atelectasis with minimal right pleural effusion. Aortic Atherosclerosis (ICD10-I70.0). Electronically Signed   By: Marijo Conception M.D.   On: 06/30/2019 13:48   Dg Chest 2 View  Result Date: 06/28/2019 CLINICAL DATA:  Weakness.  Left lower rib pain.  Fall last week. EXAM: CHEST - 2 VIEW COMPARISON:  Chest x-ray dated January 20, 2019. FINDINGS: Stable cardiomediastinal silhouette status post CABG. Atherosclerotic calcification of the aortic arch. Normal pulmonary vascularity. No focal consolidation, pleural effusion, or pneumothorax. No acute osseous abnormality. Chronic severe L1 compression deformity. IMPRESSION: No active cardiopulmonary disease. Electronically Signed   By: Titus Dubin M.D.   On: 06/28/2019 17:30   Ct Head Wo Contrast  Result Date: 06/29/2019 CLINICAL DATA:  85 year old female with multiple falls. EXAM: CT HEAD WITHOUT CONTRAST TECHNIQUE: Contiguous axial images were obtained from the base of the skull through the vertex without intravenous contrast. COMPARISON:  Head CT dated 01/31/2019 FINDINGS: Brain: There is moderate  age-related atrophy and chronic microvascular ischemic changes. 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 There is no acute intracranial hemorrhage. No mass effect or midline shift. No extra-axial fluid collection. Vascular: No hyperdense vessel or unexpected calcification. Skull: Normal. Negative for fracture or focal lesion. Sinuses/Orbits: Diffuse mucoperiosteal thickening of paranasal sinuses. No air-fluid level. The mastoid air cells are clear. Other: None IMPRESSION: 1. No acute intracranial hemorrhage. 2. Age-related atrophy and chronic microvascular ischemic changes. 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 Electronically Signed   By: Anner Crete M.D.   On: 06/29/2019 01:24   Nm Myocar Multi W/spect W/wall Motion / Ef  Result Date: 06/30/2019  There was no ST segment deviation noted during stress.  Defect 1: There is a  small defect of mild severity.  This is a low risk study.  Nuclear stress EF: 57%.  No significant reversible ischemia. LVEF 57% with normal wall motion. This is a low risk study.   Dg Hips Bilat W Or Wo Pelvis 2 Views  Result Date: 06/28/2019 CLINICAL DATA:  Fall 1 week ago with persistent left-sided hip pain, initial encounter EXAM: DG HIP (WITH OR WITHOUT PELVIS) 4V BILAT COMPARISON:  10/26/2016 FINDINGS: The pelvic ring is intact. Diffuse vascular calcifications are seen. No acute fracture or dislocation is noted. No soft tissue abnormality is seen. IMPRESSION: No acute abnormality noted. Electronically Signed   By: Inez Catalina M.D.   On: 06/28/2019 18:03     CBC Recent Labs  Lab 07/18/19 1004 07/19/19 0434  WBC 6.4 5.1  HGB 9.2* 9.1*  HCT 30.2* 29.6*  PLT 217 200  MCV 95.0 96.1  MCH 28.9 29.5  MCHC 30.5 30.7  RDW 14.3 14.1  LYMPHSABS 0.6*  --   MONOABS 0.3  --   EOSABS 0.1  --   BASOSABS 0.1  --     Chemistries  Recent Labs  Lab 07/18/19 1004 07/19/19 0434 07/20/19 0812  NA 137 138  137  K 5.2* 4.8 4.5  CL 103 103 101  CO2 24 28 27   GLUCOSE 171* 132* 84  BUN 37* 38* 42*  CREATININE 1.75* 1.64* 1.63*  CALCIUM 9.3 9.3 9.6  MG  --   --  2.4  AST 17  --   --   ALT 17  --   --   ALKPHOS 41  --   --   BILITOT 0.7  --   --    ------------------------------------------------------------------------------------------------------------------ No results for input(s): CHOL, HDL, LDLCALC, TRIG, CHOLHDL, LDLDIRECT in the last 72 hours.  Lab Results  Component Value Date   HGBA1C 8.5 (H) 06/29/2019   ------------------------------------------------------------------------------------------------------------------ No results for input(s): TSH, T4TOTAL, T3FREE, THYROIDAB in the last 72 hours.  Invalid input(s): FREET3 ------------------------------------------------------------------------------------------------------------------ No results for input(s): VITAMINB12, FOLATE, FERRITIN, TIBC, IRON, RETICCTPCT in the last 72 hours.  Coagulation profile No results for input(s): INR, PROTIME in the last 168 hours.  No results for input(s): DDIMER in the last 72 hours.  Cardiac Enzymes No results for input(s): CKMB, TROPONINI, MYOGLOBIN in the last 168 hours.  Invalid input(s): CK ------------------------------------------------------------------------------------------------------------------    Component Value Date/Time   BNP 283.0 (H) 07/18/2019 1004   Roxan Hockey M.D on 07/21/2019 at 12:38 PM  Go to [email or web address] - for contact info  [facility] - Office  [phone]

## 2019-07-22 LAB — GLUCOSE, CAPILLARY
Glucose-Capillary: 115 mg/dL — ABNORMAL HIGH (ref 70–99)
Glucose-Capillary: 141 mg/dL — ABNORMAL HIGH (ref 70–99)
Glucose-Capillary: 347 mg/dL — ABNORMAL HIGH (ref 70–99)
Glucose-Capillary: 210 mg/dL — ABNORMAL HIGH (ref 70–99)

## 2019-07-22 NOTE — Progress Notes (Signed)
Patient Demographics:    [name], is a 85 y.o. female, DOB - 03-Apr-1931, GPQ:[id number]  Admit date - 07/18/2019   Admitting Physician Courage Denton Brick, MD  Outpatient Primary MD for the patient is [name], MD  LOS - 3   Chief Complaint  Patient presents with  . Abdominal Pain      Brief Summary:- 85 y.o. female with PMHx relevant forCAD s/p CABG (2006), carotic artery stenosis, HTN with hx of hypertensive urgency, CKD stage III, DM, dyslipidemia, GERD, and chronic diastolic heart failure, prior CVA with residual slurred speech,  aortic stenosis and Mitral stenosis, CKD 3, asthma, hypertension and diabetes recent history of bradycardia with junctional bradycardia readmitted on 07/18/2019 with tachy-arrhythmia vs new onset A. Fib - symptomatic with ongoing shortness of breath. Transfer to [facility] on 07/21/19 for EP evaluation for possible tachybradycardia syndrome and possible pacemaker placement.   Subjective:   No acute issues or events overnight, patient declines chest pain, shortness of breath, nausea, vomiting, diarrhea, constipation, headache, fevers, chills.   Assessment  & Plan :    Principal Problem:   New onset a-fib ([place]) Active Problems:   Type 2 diabetes mellitus with insulin therapy ([place])   HTN (hypertension)   Anemia of chronic disease   CAD (coronary artery disease)   (HFpEF) heart failure with preserved ejection fraction ([place])   New onset symptomatic A. Fib, POA -Recent episode of prolonged symptomatic bradycardia with heart rate in the 40s(junctional/ectopic atrial rhythm) -Concurrent moderate aortic stenosis and moderate mitral stenosis -Cardiology following - appreciate insight/recs -Continue metoprolol 25mg  bid  -EP evaluation for possible tachybradycardia syndrome and possible pacemaker placement - tentatively planned Monday 07/24/19 -CHA2DS2- VASc score   is =  7(CHF, age x2, CVA x 2, HTN, Female),   Which is  equal to =  11.2 % annual risk of stroke. This patients CHA2DS2-VASc Score and unadjusted Ischemic Stroke Rate (% per year) is equal to 11.2 % stroke rate/year from a score of 7 -However patient has had recent hematochezia in March 2020 presumably from bleeding internal hemorrhoids, please see full colonoscopy report -History of recurrent falls due to bradycardia/arrhythmias -Also patient is currently on aspirin and Plavix for history of CAD and CVA --High risk of bleeding --Defer decision on full anticoagulation/Coumadin therapy to cardiology team and pt's daughter -Not a candidate for NOAC due to moderate mitral stenosis (will have to be on Coumadin) -Discussed with patient's daughter (ms [name]) risk Versus benefits of anticoagulation and she would like to talk to cardiology (Dr Alba Cory) before making a final decision  HFpEF/ Chronic diastolic heart failure; not in acute exacerbation -Hold Lasix as patient will be n.p.o. for possible EP evaluation -Continue daily weights, strict I's and O's -EF is 60 to 65% per echo from 06/26/2019 2020 -Fluid balance is negative -Weight is down to 149 pounds from 163 pounds on 07/18/2019  CAD hx -  -Status post prior coronary angioplasty with 5stentsprior to 2005,CABG 2006,follows with Dr. Percival Spanish -Troponin is not elevated, no ACS type symptoms -Continue aspirin, continue Lipitor, continue Plavix, metoprolol  AKI----acute kidney injury on CKD stage - III -Suspect worsening cardiac failure due to poor renal perfusion in the setting of CHF and tachyarrhythmia   -Creatinine baseline 1.3-1.4 Currently  1.63 -Hold metformin -Lasix on hold today due to n.p.o. status  IDDM2 - Diabetes Mellitus-  -A1c 8.5 weeks ago weeks ago reflecting uncontrolled diabetes - -Hold Metformin due to kidney concerns -Decrease Levemir to 10 units from 24 units daily due to NPO status. -Use Novolog/Humalog Sliding scale  insulin with ACHS POC glucose   CVA-with residual speech deficit- patient mostly independent of her ADLs.  Continue aspirin and Lipitor  Chronic anemia likely of CKD/chronic disease Hemoglobin 9.1, which is close to patient's baseline -No evidence of active bleeding at this time  Moderate Aortic Stenosis and Moderate Mitral Stenosis Echo from 06/29/2019 noted,  cardiology consult appreciated  Presumed UTI, POA, resolved--- completed IV Rocephin, recent urine culture with pansensitive E. coli  Disposition/Need for in-Hospital Stay- patient unable to be discharged at this time due to some dyspnea on exertion, needs to monitor heart rate with initiation of beta-blocker-given recent severe bradycardia while on beta-blocker---  will need EP evaluation for tachy-brady syndrome and consideration of PPM placement.    Code Status : Full  Disposition Plan  : Transfer to [facility] on 07/21/19 for EP evaluation for possible tachybradycardia syndrome and possible pacemaker placement Consults  :  cardiology DVT Prophylaxis  :  Lovenox   SCDs   Lab Results  Component Value Date   PLT 200 07/19/2019    Inpatient Medications  Scheduled Meds: . aspirin  81 mg Oral Q breakfast  . atorvastatin  20 mg Oral QHS  . clopidogrel  75 mg Oral Daily  . enoxaparin (LOVENOX) injection  30 mg Subcutaneous Q24H  . ferrous sulfate  324 mg Oral Daily  . furosemide  40 mg Oral Daily  . hydrALAZINE  25 mg Oral TID  . insulin aspart  0-5 Units Subcutaneous QHS  . insulin aspart  0-9 Units Subcutaneous TID WC  . insulin detemir  10 Units Subcutaneous q morning - 10a  . metoprolol tartrate  25 mg Oral BID  . pantoprazole  40 mg Oral Daily  . PARoxetine  20 mg Oral q morning - 10a  . polyethylene glycol  17 g Oral Daily  . sodium chloride flush  3 mL Intravenous Q12H   Continuous Infusions: . sodium chloride     PRN Meds:.sodium chloride, acetaminophen **OR** acetaminophen, albuterol, hydrALAZINE,  ondansetron **OR** ondansetron (ZOFRAN) IV, polyethylene glycol, sodium chloride flush, traZODone   Anti-infectives (From admission, onward)   Start     Dose/Rate Route Frequency Ordered Stop   07/19/19 1000  cefTRIAXone (ROCEPHIN) 1 g in sodium chloride 0.9 % 100 mL IVPB     1 g 200 mL/hr over 30 Minutes Intravenous Every 24 hours 07/19/19 0946 07/21/19 0905        Objective:   Vitals:   07/21/19 2139 07/21/19 2312 07/21/19 2327 07/22/19 0409  BP: 131/65  112/75 (!) 144/91  Pulse: 99  (!) 123 60  Resp: 16  16 16   Temp: 97.8 F (36.6 C)  97.7 F (36.5 C) 97.7 F (36.5 C)  TempSrc: Oral  Oral Oral  SpO2: 97%  97% 98%  Weight:  65 kg  64.4 kg  Height:  5\' 6"  (1.676 m)      Wt Readings from Last 3 Encounters:  07/22/19 64.4 kg  06/30/19 91.2 kg  02/15/19 67.1 kg     Intake/Output Summary (Last 24 hours) at 07/22/2019 0734 Last data filed at 07/21/2019 1900 Gross per 24 hour  Intake 466.29 ml  Output -  Net 466.29 ml  Physical Exam  Gen:- Awake Alert, no shortness of breath at rest,  HEENT:- [place].AT, No sclera icterus Neck-Supple Neck,No JVD,.  Lungs CTAB, without overt wheeze, rales, rhonchi  CV- S1, S2 normal, irregularly irregular, 2/6 SM, prior sternotomy scar Abd-  +ve B.Sounds, Abd Soft, No tenderness,    Extremity/Skin:- trace  edema, pedal pulses present  Psych-affect is appropriate, oriented x3 Neuro-speech deficit which is not new, no new focal deficits, no tremors   Data Review:   Micro Results Recent Results (from the past 240 hour(s))  SARS Coronavirus 2 [facility] order, Performed in [facility] hospital lab) Nasopharyngeal Nasopharyngeal Swab     Status: None   Collection Time: 07/18/19 12:26 PM   Specimen: Nasopharyngeal Swab  Result Value Ref Range Status   SARS Coronavirus 2 NEGATIVE NEGATIVE Final    Comment: (NOTE) If result is NEGATIVE SARS-CoV-2 target nucleic acids are NOT DETECTED. The SARS-CoV-2 RNA is generally detectable in upper  and lower  respiratory specimens during the acute phase of infection. The lowest  concentration of SARS-CoV-2 viral copies this assay can detect is 250  copies / mL. A negative result does not preclude SARS-CoV-2 infection  and should not be used as the sole basis for treatment or other  patient management decisions.  A negative result may occur with  improper specimen collection / handling, submission of specimen other  than nasopharyngeal swab, presence of viral mutation(s) within the  areas targeted by this assay, and inadequate number of viral copies  (<250 copies / mL). A negative result must be combined with clinical  observations, patient history, and epidemiological information. If result is POSITIVE SARS-CoV-2 target nucleic acids are DETECTED. The SARS-CoV-2 RNA is generally detectable in upper and lower  respiratory specimens dur ing the acute phase of infection.  Positive  results are indicative of active infection with SARS-CoV-2.  Clinical  correlation with patient history and other diagnostic information is  necessary to determine patient infection status.  Positive results do  not rule out bacterial infection or co-infection with other viruses. If result is PRESUMPTIVE POSTIVE SARS-CoV-2 nucleic acids MAY BE PRESENT.   A presumptive positive result was obtained on the submitted specimen  and confirmed on repeat testing.  While 2019 novel coronavirus  (SARS-CoV-2) nucleic acids may be present in the submitted sample  additional confirmatory testing may be necessary for epidemiological  and / or clinical management purposes  to differentiate between  SARS-CoV-2 and other Sarbecovirus currently known to infect humans.  If clinically indicated additional testing with an alternate test  methodology [phone]) is advised. The SARS-CoV-2 RNA is generally  detectable in upper and lower respiratory sp ecimens during the acute  phase of infection. The expected result is  Negative. Fact Sheet for Patients:  StrictlyIdeas.no Fact Sheet for Healthcare Providers: BankingDealers.co.za This test is not yet approved or cleared by the [place] FDA and has been authorized for detection and/or diagnosis of SARS-CoV-2 by FDA under an Emergency Use Authorization (EUA).  This EUA will remain in effect (meaning this test can be used) for the duration of the COVID-19 declaration under Section 564(b)(1) of the Act, 21 U.S.C. section 360bbb-3(b)(1), unless the authorization is terminated or revoked sooner. Performed at [facility], [address], [place], [place] [address]     Radiology Reports Dg Chest 2 View  Result Date: 07/18/2019 CLINICAL DATA:  Short of breath EXAM: CHEST - 2 VIEW COMPARISON:  06/30/2019 FINDINGS: Postop CABG. Interval development of diffuse bilateral airspace disease  with the appearance of edema. Interval development of small pleural effusion. Mild atelectasis in the bases. Severe lumbar compression fracture of L1 appears chronic and unchanged from prior studies. IMPRESSION: Diffuse bilateral airspace disease with small effusion consistent with congestive heart failure and edema. Electronically Signed   By: Franchot Gallo M.D.   On: 07/18/2019 11:15   Dg Chest 2 View  Result Date: 06/30/2019 CLINICAL DATA:  Chest pain. EXAM: CHEST - 2 VIEW COMPARISON:  Radiographs of June 28, 2019. FINDINGS: Stable cardiomegaly. Status post coronary bypass graft. Atherosclerosis of thoracic aorta is noted. No pneumothorax is noted. Left lung is clear. Minimal right basilar subsegmental atelectasis is noted with minimal right pleural effusion. The visualized skeletal structures are unremarkable. IMPRESSION: Minimal right basilar subsegmental atelectasis with minimal right pleural effusion. Aortic Atherosclerosis (ICD10-I70.0). Electronically Signed   By: Marijo Conception M.D.   On: 06/30/2019 13:48   Dg Chest 2 View   Result Date: 06/28/2019 CLINICAL DATA:  Weakness.  Left lower rib pain.  Fall last week. EXAM: CHEST - 2 VIEW COMPARISON:  Chest x-ray dated January 20, 2019. FINDINGS: Stable cardiomediastinal silhouette status post CABG. Atherosclerotic calcification of the aortic arch. Normal pulmonary vascularity. No focal consolidation, pleural effusion, or pneumothorax. No acute osseous abnormality. Chronic severe L1 compression deformity. IMPRESSION: No active cardiopulmonary disease. Electronically Signed   By: Titus Dubin M.D.   On: 06/28/2019 17:30   Ct Head Wo Contrast  Result Date: 06/29/2019 CLINICAL DATA:  85 year old female with multiple falls. EXAM: CT HEAD WITHOUT CONTRAST TECHNIQUE: Contiguous axial images were obtained from the base of the skull through the vertex without intravenous contrast. COMPARISON:  Head CT dated 01/31/2019 FINDINGS: Brain: There is moderate age-related atrophy and chronic microvascular ischemic changes. 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 There is no acute intracranial hemorrhage. No mass effect or midline shift. No extra-axial fluid collection. Vascular: No hyperdense vessel or unexpected calcification. Skull: Normal. Negative for fracture or focal lesion. Sinuses/Orbits: Diffuse mucoperiosteal thickening of paranasal sinuses. No air-fluid level. The mastoid air cells are clear. Other: None IMPRESSION: 1. No acute intracranial hemorrhage. 2. Age-related atrophy and chronic microvascular ischemic changes. 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 Electronically Signed   By: Anner Crete M.D.   On: 06/29/2019 01:24   Nm Myocar Multi W/spect W/wall Motion / Ef  Result Date: 06/30/2019  There was no ST segment deviation noted during stress.  Defect 1: There is a small defect of mild severity.  This is a low risk study.  Nuclear stress EF: 57%.  No significant reversible ischemia. LVEF 57% with normal wall  motion. This is a low risk study.   Dg Hips Bilat W Or Wo Pelvis 2 Views  Result Date: 06/28/2019 CLINICAL DATA:  Fall 1 week ago with persistent left-sided hip pain, initial encounter EXAM: DG HIP (WITH OR WITHOUT PELVIS) 4V BILAT COMPARISON:  10/26/2016 FINDINGS: The pelvic ring is intact. Diffuse vascular calcifications are seen. No acute fracture or dislocation is noted. No soft tissue abnormality is seen. IMPRESSION: No acute abnormality noted. Electronically Signed   By: Inez Catalina M.D.   On: 06/28/2019 18:03     CBC Recent Labs  Lab 07/18/19 1004 07/19/19 0434  WBC 6.4 5.1  HGB 9.2* 9.1*  HCT 30.2* 29.6*  PLT 217 200  MCV 95.0 96.1  MCH 28.9 29.5  MCHC 30.5 30.7  RDW 14.3 14.1  LYMPHSABS 0.6*  --   MONOABS 0.3  --  EOSABS 0.1  --   BASOSABS 0.1  --     Chemistries  Recent Labs  Lab 07/18/19 1004 07/19/19 0434 07/20/19 0812  NA 137 138 137  K 5.2* 4.8 4.5  CL 103 103 101  CO2 24 28 27   GLUCOSE 171* 132* 84  BUN 37* 38* 42*  CREATININE 1.75* 1.64* 1.63*  CALCIUM 9.3 9.3 9.6  MG  --   --  2.4  AST 17  --   --   ALT 17  --   --   ALKPHOS 41  --   --   BILITOT 0.7  --   --    ------------------------------------------------------------------------------------------------------------------ No results for input(s): CHOL, HDL, LDLCALC, TRIG, CHOLHDL, LDLDIRECT in the last 72 hours.  Lab Results  Component Value Date   HGBA1C 8.5 (H) 06/29/2019   ------------------------------------------------------------------------------------------------------------------ No results for input(s): TSH, T4TOTAL, T3FREE, THYROIDAB in the last 72 hours.  Invalid input(s): FREET3 ------------------------------------------------------------------------------------------------------------------ No results for input(s): VITAMINB12, FOLATE, FERRITIN, TIBC, IRON, RETICCTPCT in the last 72 hours.  Coagulation profile No results for input(s): INR, PROTIME in the last 168 hours.   No results for input(s): DDIMER in the last 72 hours.  Cardiac Enzymes No results for input(s): CKMB, TROPONINI, MYOGLOBIN in the last 168 hours.  Invalid input(s): CK ------------------------------------------------------------------------------------------------------------------    Component Value Date/Time   BNP 283.0 (H) 07/18/2019 1004   Little Ishikawa DO on 07/22/2019 at 7:34 AM  Go to [email or web address] - for contact info  [facility] - Office  [phone]

## 2019-07-22 NOTE — Consult Note (Addendum)
Cardiology Consultation:   [name] MRN: [id number]; DOB: 07/21/1931  Admit date: 07/18/2019 Date of Consult: 07/22/2019  Primary Care Provider: [name], MD Primary Cardiologist: Minus Breeding, MD  Primary Electrophysiologist:  None   Patient Profile:   [name] is a 85 y.o. female with a hx of CAD, s/p multiple stents and CABG 2006,  who is being seen today for the evaluation of new onset atrial fibrillation and possible tachybradycardia syndrome at the request of Dr. Avon Gully.  History of Present Illness:   [name]  is a 85 y.o. female with past medical history of CAD (s/p CABG in 2006), carotid artery stenosis, aortic stenosis HTN, HLD, Type 2 DM, Stage 3 CKD, and prior CVA who was seen at [facility] for the evaluation of atrial fibrillation with RVR  by Dr. Bronson Ing.  Heart rates remained elevated in spite of increase in Lopressor.  The patient has been known to have prior experience of junctional bradycardia and long pauses with increasing Lopressor during prior hospitalization.  The patient was transferred to [facility] to be evaluated by EP for tachy-bradycardia syndrome and possible candidacy for pacemaker.  [name] was most recently admitted to [facility] from 7/22 - 07/02/2019 for evaluation of worsening weakness and falls, found to be bradycardiac with HR in the 30's to 40's upon arrival and most consistent with junctional bradycardia. Initial and delta HS Troponin values were elevated at 736 and 657 with EKG showing a new RBBB. She had been on Lopressor 50mg  BID and this was held at the time of admission. Echocardiogram showed a preserved EF of 60-65% with no regional WMA. LA was severely dilated and she had moderate mitral stenosis, mild to moderate MR along with moderate AS. Given her elevated cardiac enzymes, a NST was obtained and showed a no significant reversible ischemia and was overall a low-risk study. By review of  notes, she maintained sinus rhythm during admission with rates improving into the 60's and 70's. Was discharged home on 07/02/2019 with Imdur, Lopressor, and Aldactone being discontinued. Was continued on ASA, Plavix, Atorvastatin, Lasix 20mg  daily, Hydralazine 50mg  TID, and Amlodipine 10mg  daily,   She presented back to [facility] ED on 07/18/2019 for evaluation of dyspnea and abdominal discomfort.  It seemed that dyspnea was her primary complaint.  Initial EKG showed atrial fibrillation, 115 bpm, with known RBBB.  It was noted that atrial fibrillation was new for her.  Heart rates remained elevated despite addition of Lopressor 25 mg twice daily.  This patients CHA2DS2-VASc Score and unadjusted Ischemic Stroke Rate (% per year) is equal to10.8 % stroke rate/year from a score of 8(HTN, DM, Vascular, Female, Age (2), CVA (2)). Not a good candidate for NOAC's given Valvular Heart Disease and unsure how she would do on Coumadin given her hematochezia earlier this year along with known internal hemorrhoids and diverticulosis. The patient was unsure if she would want to be on anticoagulation and has deferred this decision to her daughter who also was unsure and wanted to defer to Dr. Percival Spanish.   The patient was treated for acute on chronic diastolic CHF likely secondary to tachyarrhythmia.  She was given Lasix 40 mg IV twice daily with improvement in volume status.  Lasix was switched to oral dosing.  Echocardiogram on 06/29/2019 showed EF 60-65% with moderate concentric LVH and elevated mean left atrial pressure.  She also has severe mitral annular calcification with mild-moderate mitral regurgitation and moderate mitral valve  stenosis.  Today the patient is resting comfortably in bed.  She does admit to some dizziness when she is up moving around and occasional mild chest pressure.  She denies any difficulty breathing.  She lives with her daughter.  About 1-2 weeks ago she developed generalized weakness and  weakness in her legs and back pain.  Usually she is able to get around her home using a walker but she has not been getting around well since the weakness started.  She bathes herself at a sink or using a pen.  She is unable to get in the bathtub.  Her daughter does the housework and prepares meals.  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 Amedeo Plenty: Diverticulosis   COLONOSCOPY WITH PROPOFOL N/A 02/14/2019   Procedure: COLONOSCOPY WITH PROPOFOL;  Surgeon: Danie Binder, MD;  Location: [facility];  Service: Endoscopy;  Laterality: N/A;   CORONARY ANGIOPLASTY     prior to 2006 5 stents   CORONARY ARTERY BYPASS GRAFT  2006   [facility] [place]  April 2013   KNEE SURGERY Right    POLYPECTOMY  02/14/2019   Procedure: POLYPECTOMY;  Surgeon: Danie Binder, MD;  Location: [facility];  Service: Endoscopy;;  cecal polyp   TOTAL ABDOMINAL HYSTERECTOMY  ~85 years old   complete, with tumor removal   TOTAL KNEE ARTHROPLASTY Right 08/13/2015    Procedure: RIGHT  TOTAL KNEE ARTHROPLASTY;  Surgeon: Paralee Cancel, MD;  Location: [facility];  Service: Orthopedics;  Laterality: Right;     Home Medications:  Prior to Admission medications   Medication Sig Start Date End Date Taking? Authorizing Provider  albuterol (PROVENTIL HFA;VENTOLIN HFA) 108 (90 Base) MCG/ACT inhaler Inhale 2 puffs into the lungs every 6 (six) hours as needed for wheezing or shortness of breath. 01/20/19  Yes Stacks, Cletus Gash, MD  amLODipine (NORVASC) 10 MG tablet TAKE 1 TABLET ONCE DAILY. 07/06/19  Yes [name], MD  aspirin 81 MG chewable tablet Chew 81 mg by mouth at bedtime. 08/04/10  Yes [provider]  atorvastatin (LIPITOR) 20 MG tablet Take 1 tablet (20 mg total) by mouth at bedtime. 10/18/18  Yes Stacks, Cletus Gash, MD  Carboxymethylcellul-Glycerin (CLEAR EYES FOR DRY EYES) 1-0.25 % SOLN Place 1 drop into  both eyes daily as needed.   Yes [provider]  clopidogrel (PLAVIX) 75 MG tablet TAKE 1 TABLET DAILY. Patient taking differently: Take 75 mg by mouth daily.  02/03/19  Yes Stacks, Cletus Gash, MD  esomeprazole (NEXIUM) 40 MG capsule TAKE 1 CAPSULE DAILY. 07/06/19  Yes [name], MD  ferrous sulfate 324 (65 Fe) MG TBEC Take 324 mg by mouth daily.    Yes [provider]  furosemide (LASIX) 20 MG tablet Take 1 tablet (20 mg total) by mouth every morning. 07/02/19  Yes Domenic Polite, MD  hydrALAZINE (APRESOLINE) 50 MG tablet TAKE 1 TABLET BY MOUTH 3 TIMES DAILY. 07/06/19  Yes Stacks, Cletus Gash, MD  insulin lispro (HUMALOG KWIKPEN) 100 UNIT/ML KiwkPen Inject 0.04-0.1 mLs (4-10 Units total) into the skin daily as needed (high blood sugar). Patient taking differently: Inject 5-15 Units into the skin daily as needed (for high blood sugar levels over 250).  07/12/18  Yes Stacks, Cletus Gash, MD  LEVEMIR FLEXTOUCH 100 UNIT/ML Pen INJECT [place] MORNING Patient taking differently: Inject 24 Units into the skin every morning.  06/19/19  Yes Stacks, Cletus Gash,  MD  lisinopril (ZESTRIL) 20 MG tablet Take 20 mg by mouth daily. 07/05/19  Yes [provider]  magnesium hydroxide (MILK OF MAGNESIA) 400 MG/5ML suspension Take 30 mLs by mouth daily as needed for mild constipation.   Yes [provider]  metFORMIN (GLUCOPHAGE-XR) 500 MG 24 hr tablet TAKE 1 TABLET 2 TIMES A DAY WITH BREAKFAST & DINNER. Patient taking differently: Take 500 mg by mouth 2 (two) times daily.  07/06/19  Yes Stacks, Cletus Gash, MD  Multiple Vitamins-Iron (DAILY VITAMINS/IRON/BETA CAROT PO) Take 1 tablet by mouth at bedtime.   Yes [provider]  nitroGLYCERIN (NITROSTAT) 0.4 MG SL tablet PLACE (1) TABLET UNDER TONGUE EVERY 5 MINUTES UP TO (3) DOSES. IF NO RELIEF CALL 911. 07/06/19  Yes Stacks, Cletus Gash, MD  PARoxetine (PAXIL) 20 MG tablet TAKE 1 TABLET BY MOUTH EVERY MORNING. 07/06/19  Yes Stacks, Cletus Gash, MD  polyethylene glycol [facility]) packet Take 17 g by mouth daily. Patient taking differently: Take 17 g by mouth daily as needed for mild constipation or moderate constipation.  07/25/18  Yes Maudie Flakes, MD    Inpatient Medications: Scheduled Meds:  aspirin  81 mg Oral Q breakfast   atorvastatin  20 mg Oral QHS   clopidogrel  75 mg Oral Daily   enoxaparin (LOVENOX) injection  30 mg Subcutaneous Q24H   ferrous sulfate  324 mg Oral Daily   furosemide  40 mg Oral Daily   hydrALAZINE  25 mg Oral TID   insulin aspart  0-5 Units Subcutaneous QHS   insulin aspart  0-9 Units Subcutaneous TID WC   insulin detemir  10 Units Subcutaneous q morning - 10a   metoprolol tartrate  25 mg Oral BID   pantoprazole  40 mg Oral Daily   PARoxetine  20 mg Oral q morning - 10a   polyethylene glycol  17 g Oral Daily   sodium chloride flush  3 mL Intravenous Q12H   Continuous Infusions:  sodium chloride     PRN Meds: sodium chloride, acetaminophen **OR** acetaminophen, albuterol, hydrALAZINE, ondansetron **OR** ondansetron (ZOFRAN) IV, polyethylene glycol,  sodium chloride flush, traZODone  Allergies:    Allergies  Allergen Reactions   Advil [Ibuprofen] Swelling   Heparin Other (See Comments)    Confusion, hallucinations.    Propoxyphene N-Acetaminophen Other (See Comments)    Numbness all over. "floating" sensation.  Social History:   Social History   Socioeconomic History   Marital status: Widowed    Spouse name: Not on file   Number of children: 4   Years of education: 3   Highest education level: 3rd grade  Occupational History   Occupation: retired  Scientist, product/process development strain: Not very hard   Food insecurity    Worry: Never true    Inability: Never true   Transportation needs    Medical: No    Non-medical: No  Tobacco Use   Smoking status: Never Smoker   Smokeless tobacco: Never Used  Substance and Sexual Activity   Alcohol use: No   Drug use: No   Sexual activity: Not Currently  Lifestyle   Physical activity    Days per week: 0 days    Minutes per session: 0 min   Stress: To some extent  Relationships   Social connections    Talks on phone: More than three times a week    Gets together: More than three times a week    Attends religious service: More than 4 times per year    Active member of club or organization: No    Attends meetings of clubs or organizations: Never    Relationship status: Widowed   Intimate partner violence    Fear of current or ex partner: No    Emotionally abused: No    Physically abused: No    Forced sexual activity: No  Other Topics Concern   Not on file  Social History Narrative   Not on file    Family History:    Family History  Problem Relation Age of Onset   Cancer Brother        porstate   Early death Sister    Heart disease Brother    Heart disease Brother    Heart attack Brother    Heart disease Brother    Heart attack Brother    Diabetes Sister    Heart attack Sister    Diabetes Sister    Osteoporosis Sister      Hypertension Sister    Heart attack Son    Early death Son    Pancreatitis Son    Heart attack Daughter 41     ROS:  Please see the history of present illness.   All other ROS reviewed and negative.     Physical Exam/Data:   Vitals:   07/21/19 2139 07/21/19 2312 07/21/19 2327 07/22/19 0409  BP: 131/65  112/75 (!) 144/91  Pulse: 99  (!) 123 60  Resp: 16  16 16   Temp: 97.8 F (36.6 C)  97.7 F (36.5 C) 97.7 F (36.5 C)  TempSrc: Oral  Oral Oral  SpO2: 97%  97% 98%  Weight:  65 kg  64.4 kg  Height:  5\' 6"  (1.676 m)      Intake/Output Summary (Last 24 hours) at 07/22/2019 0847 Last data filed at 07/21/2019 1900 Gross per 24 hour  Intake 466.29 ml  Output --  Net 466.29 ml   Last 3 Weights 07/22/2019 07/21/2019 07/21/2019  Weight (lbs) 142 lb 143 lb 4.8 oz 149 lb 11.1 oz  Weight (kg) 64.411 kg 65 kg 67.9 kg     Body mass index is 22.92 kg/m.  General: Frail elderly female, in no acute distress HEENT: Edentulous with poor fitting dentures Lymph: no adenopathy Neck: no JVD Endocrine:  No thryomegaly Vascular: No carotid bruits; FA pulses 2+ bilaterally without bruits  Cardiac: Irregularly irregular rhythm, 3/6 harsh systolic murmur RUSB, LUSB and apical Lungs:  clear to auscultation bilaterally, no wheezing, rhonchi or rales  Abd: soft, nontender, no hepatomegaly  Ext: no edema Musculoskeletal:  No deformities, BUE and BLE strength normal and equal Skin: warm and dry  Neuro:  CNs 2-12 intact, no focal abnormalities noted Psych:  Normal affect   EKG:  The EKG was personally reviewed and demonstrates:   EKG from 07/18/2019: Atrial fibrillation, 115 bpm, PVC, RBBB Telemetry:  Telemetry was personally reviewed and demonstrates: Atrial fibrillation in the 90s with no significant pauses  Relevant CV Studies:   Echocardiogram: 06/29/2019 IMPRESSIONS  1. The left ventricle has normal systolic function with an ejection fraction of 60-65%. The cavity size was  normal. There is moderate concentric left ventricular hypertrophy. Left ventricular diastolic Doppler parameters are consistent with  pseudonormalization. Elevated mean left atrial pressure. 2. The right ventricle has normal systolic function. The cavity was normal. There is no increase in right ventricular wall thickness. Right ventricular systolic pressure is mildly elevated with an estimated pressure of 36.6 mmHg. 3. Left atrial size was severely dilated. 4. The mitral valve is degenerative. Mild thickening of the mitral valve leaflet. Mild calcification of the mitral valve leaflet. There is severe mitral annular calcification present. Mitral valve regurgitation is mild to moderate by color flow Doppler. Moderate mitral valve stenosis. 5. The aortic valve is tricuspid. Moderate thickening of the aortic valve. Moderate calcification of the aortic valve. Moderate stenosis of the aortic valve. 6. The aorta is normal in size and structure. 7. The inferior vena cava was dilated in size with >50% respiratory variability. 8. When compared to the prior study: 10/08/2018, there is very slight worsening of the aortic stenosis. Mitral valve gradients appear lower due to bradycardia.   Laboratory Data:  High Sensitivity Troponin:   Recent Labs  Lab 06/28/19 1634 06/28/19 1809  TROPONINIHS 736* 657*     Cardiac EnzymesNo results for input(s): TROPONINI in the last 168 hours. No results for input(s): TROPIPOC in the last 168 hours.  Chemistry Recent Labs  Lab 07/18/19 1004 07/19/19 0434 07/20/19 0812  NA 137 138 137  K 5.2* 4.8 4.5  CL 103 103 101  CO2 24 28 27   GLUCOSE 171* 132* 84  BUN 37* 38* 42*  CREATININE 1.75* 1.64* 1.63*  CALCIUM 9.3 9.3 9.6  GFRNONAA 26* 28* 28*  GFRAA 30* 33* 33*  ANIONGAP 10 7 9     Recent Labs  Lab 07/18/19 1004  PROT 6.7  ALBUMIN 3.5  AST 17  ALT 17  ALKPHOS 41  BILITOT 0.7   Hematology Recent Labs  Lab 07/18/19 1004 07/19/19 0434  WBC  6.4 5.1  RBC 3.18* 3.08*  HGB 9.2* 9.1*  HCT 30.2* 29.6*  MCV 95.0 96.1  MCH 28.9 29.5  MCHC 30.5 30.7  RDW 14.3 14.1  PLT 217 200   BNP Recent Labs  Lab 07/18/19 1004  BNP 283.0*    DDimer No results for input(s): DDIMER in the last 168 hours.   Radiology/Studies:  Dg Chest 2 View  Result Date: 07/18/2019 CLINICAL DATA:  Short of breath EXAM: CHEST - 2 VIEW COMPARISON:  06/30/2019 FINDINGS: Postop CABG. Interval development of diffuse bilateral airspace disease with the appearance of edema. Interval development of small pleural effusion. Mild atelectasis in the bases. Severe lumbar compression fracture of L1 appears chronic and unchanged from prior studies. IMPRESSION: Diffuse bilateral airspace disease with small effusion consistent with congestive heart  failure and edema. Electronically Signed   By: Franchot Gallo M.D.   On: 07/18/2019 11:15    Assessment and Plan:   New onset atrial fibrillation -Patient presented with worsening dyspnea.  She also had an admission to the hospital last month with junctional bradycardia on prior to arrival Lopressor 50 mg twice daily which was then discontinued. -During her hospitalization at [facility] she remained in atrial fibrillation with elevated rates.  Lopressor 25 mg twice daily was added.  Rates continued in the 90s-110s with minimal activity.  Dr. Bronson Ing was reluctant to increase beta-blocker due to prior bradycardia and significant pauses.  Patient was transferred to [facility] for EP evaluation for possible tachybradycardia syndrome and consideration of PPM placement. -Per prior notes: This patients CHA2DS2-VASc Score and unadjusted Ischemic Stroke Rate (% per year) is equal to10.8 % stroke rate/year from a score of 8(HTN, DM, Vascular, Female, Age (2), CVA (2)). Not a good candidate for NOAC's given Valvular Heart Disease and unsure how she would do on Coumadin given her hematochezia earlier this year along with known  internal hemorrhoids and diverticulosis. The patient was unsure if she would want to be on anticoagulation and has deferred this decision to her daughter who also was unsure and wanted to defer to Dr. Percival Spanish.   Tachycardia-bradycardia syndrome -Plan for pacemaker insertion on Monday.  Dr. Lovena Le will call and speak with the patient's daughter, likely tomorrow.  Acute on chronic diastolic CHF -Felt to likely be related to tachyarrhythmia -Patient was diuresed with IV Lasix 40 mg twice daily with improvement and is now switched to Lasix 40 mg orally daily  CAD -S/P CABG in 2006 with recent NST last admission showing no significant reversible ischemia and was overall a low risk study -Patient continues on aspirin, Plavix and statin therapy.  Presumably on Plavix at this time from a neurology perspective given multiple CVA's as she has not required any recent PCI.  Would stop aspirin if she were to initiate anticoagulation for A. Fib.  Valvular heart disease -Recent echocardiogram last month showed moderate mitral stenosis, mild-moderate mitral regurgitation and moderate AS as per echo above. -Plan to continue following as an outpatient.  Hypertension -Notes indicate a soft blood pressure in the last 24 hours.  Hydralazine was decreased from 50 mg to 25 mg 3 times daily.  Prior to arrival amlodipine is being held to allow for further titration of AV nodal blocking agents. -This morning blood pressure is actually up to 144/91, was 112/75 last night -Continue to monitor and adjust medications as indicated  Stage III CKD -Notes indicate baseline creatinine 1.3-1.4.  Creatinine was 1.75 on admission.  1.63 on 05/28/2978 -Monitor metabolic panel  UTI -Urine culture positive for E. coli.  Treatment with Rocephin.  WBCs improved. -Management per primary team.   For questions or updates, please contact [place] Please consult [email or web address] for contact info under    Signed, Daune Perch, NP  07/22/2019 8:47 AM  EP Attending  Patient seen and examined. Agree with the findings as noted above. The patient is doing well today. She was transferred for atrial fib with a RVR in the setting of prior sinus node dysfunction. She has not had syncope. She has minimal palpitations. Her options for treatment of her atrial fib are limited. I would suggest back up PPM insertion and uptitration of her AV nodal blocking drugs as tolerated. If her fib could still not be controlled then AV node ablation would be reasonable.  Alternatively, amiodarone would be an option for pursuit of rhythm control.  Mikle Bosworth.D.

## 2019-07-23 LAB — GLUCOSE, CAPILLARY
Glucose-Capillary: 83 mg/dL (ref 70–99)
Glucose-Capillary: 225 mg/dL — ABNORMAL HIGH (ref 70–99)
Glucose-Capillary: 282 mg/dL — ABNORMAL HIGH (ref 70–99)
Glucose-Capillary: 155 mg/dL — ABNORMAL HIGH (ref 70–99)

## 2019-07-23 LAB — CBC
HCT: 31.5 % — ABNORMAL LOW (ref 36.0–46.0)
Hemoglobin: 9.9 g/dL — ABNORMAL LOW (ref 12.0–15.0)
MCH: 29.5 pg (ref 26.0–34.0)
MCHC: 31.4 g/dL (ref 30.0–36.0)
MCV: 93.8 fL (ref 80.0–100.0)
Platelets: 227 10*3/uL (ref 150–400)
RBC: 3.36 MIL/uL — ABNORMAL LOW (ref 3.87–5.11)
RDW: 13.7 % (ref 11.5–15.5)
WBC: 4.8 10*3/uL (ref 4.0–10.5)
nRBC: 0 % (ref 0.0–0.2)

## 2019-07-23 LAB — BASIC METABOLIC PANEL
Anion gap: 10 (ref 5–15)
BUN: 49 mg/dL — ABNORMAL HIGH (ref 8–23)
CO2: 27 mmol/L (ref 22–32)
Calcium: 9.4 mg/dL (ref 8.9–10.3)
Chloride: 101 mmol/L (ref 98–111)
Creatinine, Ser: 2.01 mg/dL — ABNORMAL HIGH (ref 0.44–1.00)
GFR calc Af Amer: 26 mL/min — ABNORMAL LOW (ref >60)
GFR calc non Af Amer: 22 mL/min — ABNORMAL LOW (ref >60)
Glucose, Bld: 66 mg/dL — ABNORMAL LOW (ref 70–99)
Potassium: 4.9 mmol/L (ref 3.5–5.1)
Sodium: 138 mmol/L (ref 135–145)

## 2019-07-23 LAB — SURGICAL PCR SCREEN
MRSA, PCR: NEGATIVE
Staphylococcus aureus: NEGATIVE

## 2019-07-23 MED ORDER — SODIUM CHLORIDE 0.9 % IV SOLN
INTRAVENOUS | Status: DC
  Administered 2019-07-24: 07:00:00 via INTRAVENOUS

## 2019-07-23 MED ORDER — SODIUM CHLORIDE 0.9 % IV SOLN
80.0000 mg | INTRAVENOUS | Status: DC
  Filled 2019-07-23: qty 2

## 2019-07-23 MED ORDER — FUROSEMIDE 20 MG PO TABS: 20.0000 mg | ORAL_TABLET | Freq: Every morning | ORAL | Status: DC

## 2019-07-23 MED ORDER — CEFAZOLIN SODIUM-DEXTROSE 2-4 GM/100ML-% IV SOLN: 2.0000 g | INTRAVENOUS | Status: DC

## 2019-07-23 MED ORDER — SODIUM CHLORIDE 0.45 % IV SOLN
INTRAVENOUS | Status: DC
  Administered 2019-07-24: 07:00:00 via INTRAVENOUS

## 2019-07-23 MED ORDER — FUROSEMIDE 20 MG PO TABS
20.0000 mg | ORAL_TABLET | Freq: Every morning | ORAL | Status: DC
  Administered 2019-07-25: 20 mg via ORAL
  Filled 2019-07-23: qty 1

## 2019-07-23 MED ORDER — SODIUM CHLORIDE 0.9 % IV SOLN
INTRAVENOUS | Status: AC
  Administered 2019-07-23 (×2): via INTRAVENOUS

## 2019-07-23 NOTE — Progress Notes (Signed)
Progress Note  Patient Name: [name] Date of Encounter: 07/23/2019  Primary Cardiologist: Minus Breeding, MD   Subjective   No chest pain, sob, or palpitations.   Inpatient Medications    Scheduled Meds: . aspirin  81 mg Oral Q breakfast  . atorvastatin  20 mg Oral QHS  . clopidogrel  75 mg Oral Daily  . enoxaparin (LOVENOX) injection  30 mg Subcutaneous Q24H  . ferrous sulfate  324 mg Oral Daily  . furosemide  40 mg Oral Daily  . hydrALAZINE  25 mg Oral TID  . insulin aspart  0-5 Units Subcutaneous QHS  . insulin aspart  0-9 Units Subcutaneous TID WC  . insulin detemir  10 Units Subcutaneous q morning - 10a  . metoprolol tartrate  25 mg Oral BID  . pantoprazole  40 mg Oral Daily  . PARoxetine  20 mg Oral q morning - 10a  . polyethylene glycol  17 g Oral Daily  . sodium chloride flush  3 mL Intravenous Q12H   Continuous Infusions: . sodium chloride    . sodium chloride 75 mL/hr at 07/23/19 1009   PRN Meds: sodium chloride, acetaminophen **OR** acetaminophen, albuterol, hydrALAZINE, ondansetron **OR** ondansetron (ZOFRAN) IV, polyethylene glycol, sodium chloride flush, traZODone   Vital Signs    Vitals:   07/22/19 1726 07/22/19 2226 07/23/19 0500 07/23/19 0800  BP: 127/67 107/72 (!) 155/75 (!) 157/84  Pulse:  88 100   Resp:  15 16   Temp:  98.3 F (36.8 C) 98 F (36.7 C)   TempSrc:  Oral Oral   SpO2:  95% 97%   Weight:   65.9 kg   Height:        Intake/Output Summary (Last 24 hours) at 07/23/2019 1020 Last data filed at 07/23/2019 0600 Gross per 24 hour  Intake 240 ml  Output 1250 ml  Net -1010 ml   Filed Weights   07/21/19 2312 07/22/19 0409 07/23/19 0500  Weight: 65 kg 64.4 kg 65.9 kg    Telemetry    Atrial fib with a CVR/RVR - Personally Reviewed  ECG    none - Personally Reviewed  Physical Exam   GEN: No acute distress.   Neck: No JVD Cardiac: RRR, no murmurs, rubs, or gallops.  Respiratory: Clear to auscultation bilaterally.  GI: Soft, nontender, non-distended  MS: No edema; No deformity. Neuro:  Nonfocal  Psych: Normal affect   Labs    Chemistry Recent Labs  Lab 07/18/19 1004 07/19/19 0434 07/20/19 0812 07/23/19 0348  NA 137 138 137 138  K 5.2* 4.8 4.5 4.9  CL 103 103 101 101  CO2 24 28 27 27   GLUCOSE 171* 132* 84 66*  BUN 37* 38* 42* 49*  CREATININE 1.75* 1.64* 1.63* 2.01*  CALCIUM 9.3 9.3 9.6 9.4  PROT 6.7  --   --   --   ALBUMIN 3.5  --   --   --   AST 17  --   --   --   ALT 17  --   --   --   ALKPHOS 41  --   --   --   BILITOT 0.7  --   --   --   GFRNONAA 26* 28* 28* 22*  GFRAA 30* 33* 33* 26*  ANIONGAP 10 7 9 10      Hematology Recent Labs  Lab 07/18/19 1004 07/19/19 0434 07/23/19 0348  WBC 6.4 5.1 4.8  RBC 3.18* 3.08* 3.36*  HGB 9.2* 9.1* 9.9*  HCT 30.2* 29.6* 31.5*  MCV 95.0 96.1 93.8  MCH 28.9 29.5 29.5  MCHC 30.5 30.7 31.4  RDW 14.3 14.1 13.7  PLT 217 200 227    Cardiac EnzymesNo results for input(s): TROPONINI in the last 168 hours. No results for input(s): TROPIPOC in the last 168 hours.   BNP Recent Labs  Lab 07/18/19 1004  BNP 283.0*     DDimer No results for input(s): DDIMER in the last 168 hours.   Radiology    No results found.  Cardiac Studies   none  Patient Profile     85 y.o. female admitted with uncontrolled atrial fib/h/o tachybrady  Assessment & Plan    1. Atrial fib - her VR is better today. She will undergo PPM tomorrow for symptomatic tachy-brady syndrome. I have discussed the indications/risks/benefits/goals/expectations of PPM insertion with the patient and her daughter and she wishes to proceed.  2. Symptomatic bradycardia - she has sinus node dysfunction. She will undergo PPM.     For questions or updates, please contact [place] Please consult [email or web address] for contact info under Cardiology/STEMI.      Signed, Cristopher Peru, MD  07/23/2019, 10:20 AM  [name], female   DOB: 10-25-1931, 85 y.o.   MRN:  [id number]

## 2019-07-23 NOTE — Progress Notes (Signed)
Patient Demographics:    [name], is a 85 y.o. female, DOB - 19-Nov-1931, NID:[id number]  Admit date - 07/18/2019   Admitting Physician Courage Denton Brick, MD  Outpatient Primary MD for the patient is [name], MD  LOS - 4   Chief Complaint  Patient presents with   Abdominal Pain      Brief Summary:- 85 y.o. female with PMHx relevant forCAD s/p CABG (2006), carotic artery stenosis, HTN with hx of hypertensive urgency, CKD stage III, DM, dyslipidemia, GERD, and chronic diastolic heart failure, prior CVA with residual slurred speech,  aortic stenosis and Mitral stenosis, CKD 3, asthma, hypertension and diabetes recent history of bradycardia with junctional bradycardia readmitted on 07/18/2019 with tachy-arrhythmia vs new onset A. Fib - symptomatic with ongoing shortness of breath. Transfer to [facility] on 07/21/19 for EP evaluation for possible tachybradycardia syndrome and possible pacemaker placement.   Subjective:   No acute issues or events overnight, patient declines chest pain, shortness of breath, nausea, vomiting, diarrhea, constipation, headache, fevers, chills.   Assessment  & Plan :    Principal Problem:   New onset a-fib ([place]) Active Problems:   Type 2 diabetes mellitus with insulin therapy ([place])   HTN (hypertension)   Anemia of chronic disease   CAD (coronary artery disease)   (HFpEF) heart failure with preserved ejection fraction ([place])    New onset symptomatic A. Fib, POA; concern for tachy/brady syndrome. Rate currently well controlled -Recent episode of prolonged symptomatic bradycardia with heart rate in the 40s (junctional/ectopic atrial rhythm) -Concurrent moderate aortic stenosis and moderate mitral stenosis -Cardiology following - appreciate insight/recs -Continue metoprolol 25mg  bid  -EP evaluation for possible tachybradycardia syndrome and possible pacemaker  placement - tentatively planned Monday 07/24/19 -CHA2DS2- VASc score   is = 7(CHF, age x2, CVA x 2, HTN, Female),   Which is  equal to =  11.2 % annual risk of stroke. This patients CHA2DS2-VASc Score and unadjusted Ischemic Stroke Rate (% per year) is equal to 11.2 % stroke rate/year from a score of 7 -However patient has had recent hematochezia in March 2020 presumably from bleeding internal hemorrhoids, please see full colonoscopy report -History of recurrent falls due to bradycardia/arrhythmias -Also patient is currently on aspirin and Plavix for history of CAD and CVA --Defer decision on full anticoagulation/Coumadin therapy to cardiology team and pt's daughter -Not a candidate for NOAC due to moderate mitral stenosis (will have to be on Coumadin) -Discussed with patient's daughter (ms [name]) risk Versus benefits of anticoagulation and she would like to talk to primary cardiologist (Dr Alba Cory) before making a final decision  HFpEF/ Chronic diastolic heart failure; not in acute exacerbation -Hold Lasix as patient will be n.p.o. for possible EP evaluation - restart Tuesday 18th -Continue daily weights, strict I's and O's -EF is 60 to 65% per echo from 06/26/2019 -Fluid balance remains net negative -Weight is down from 74kg on 07/18/2019 - continue to follow  CAD hx -  -Status post prior coronary angioplasty with 5stentsprior to 2005,CABG 2006,follows with Dr. Percival Spanish -Troponin is not elevated, no ACS type symptoms -Continue aspirin, continue Lipitor, continue Plavix, metoprolol  Acute kidney injury on CKD stage - III -Suspect worsening cardiac failure due to poor renal perfusion in the  setting of CHF and tachyarrhythmia   -Creatinine baseline 1.3-1.4 Currently 2.01 -Hold metformin -Lasix on hold - continue gentle fluids @75cc  - continue until surgery given NPO status overnight.  IDDM2 - Diabetes Mellitus-  -A1c 8.5 weeks ago weeks ago reflecting uncontrolled diabetes - -Hold  Metformin due to kidney concerns -Decrease Levemir to 10 units from 24 units daily due to NPO status. -Use Novolog/Humalog Sliding scale insulin with ACHS POC glucose   CVA-with residual speech deficit- patient mostly independent of her ADLs.  Continue aspirin and Lipitor  Chronic anemia likely of CKD/chronic disease Hemoglobin 9.1, which is close to patient's baseline -No evidence of active bleeding at this time  Moderate Aortic Stenosis and Moderate Mitral Stenosis Echo from 06/29/2019 noted,  cardiology consult appreciated  Presumed UTI, POA, resolved Completed IV Rocephin, recent urine culture with pansensitive E. coli  Disposition/Need for in-Hospital Stay- patient unable to be discharged at this time due to some dyspnea on exertion, needs to monitor heart rate with initiation of beta-blocker-given recent severe bradycardia while on beta-blocker---  will need EP evaluation for tachy-brady syndrome and consideration of PPM placement.    Code Status : Full  Disposition Plan  : EP evaluation for possible tachybradycardia syndrome and possible pacemaker placement 07/24/19 - pending disposition home pending clinical status/improvement Consults  :  cardiology DVT Prophylaxis  :  Lovenox   SCDs   Lab Results  Component Value Date   PLT 227 07/23/2019    Inpatient Medications  Scheduled Meds:  aspirin  81 mg Oral Q breakfast   atorvastatin  20 mg Oral QHS   clopidogrel  75 mg Oral Daily   enoxaparin (LOVENOX) injection  30 mg Subcutaneous Q24H   ferrous sulfate  324 mg Oral Daily   furosemide  40 mg Oral Daily   hydrALAZINE  25 mg Oral TID   insulin aspart  0-5 Units Subcutaneous QHS   insulin aspart  0-9 Units Subcutaneous TID WC   insulin detemir  10 Units Subcutaneous q morning - 10a   metoprolol tartrate  25 mg Oral BID   pantoprazole  40 mg Oral Daily   PARoxetine  20 mg Oral q morning - 10a   polyethylene glycol  17 g Oral Daily   sodium chloride  flush  3 mL Intravenous Q12H   Continuous Infusions:  sodium chloride     PRN Meds:.sodium chloride, acetaminophen **OR** acetaminophen, albuterol, hydrALAZINE, ondansetron **OR** ondansetron (ZOFRAN) IV, polyethylene glycol, sodium chloride flush, traZODone   Anti-infectives (From admission, onward)   Start     Dose/Rate Route Frequency Ordered Stop   07/19/19 1000  cefTRIAXone (ROCEPHIN) 1 g in sodium chloride 0.9 % 100 mL IVPB     1 g 200 mL/hr over 30 Minutes Intravenous Every 24 hours 07/19/19 0946 07/21/19 0905        Objective:   Vitals:   07/22/19 1254 07/22/19 1726 07/22/19 2226 07/23/19 0500  BP: 111/69 127/67 107/72 (!) 155/75  Pulse: 86  88 100  Resp: 15  15 16   Temp: 98.4 F (36.9 C)  98.3 F (36.8 C) 98 F (36.7 C)  TempSrc: Oral  Oral Oral  SpO2: 98%  95% 97%  Weight:    65.9 kg  Height:        Wt Readings from Last 3 Encounters:  07/23/19 65.9 kg  06/30/19 91.2 kg  02/15/19 67.1 kg     Intake/Output Summary (Last 24 hours) at 07/23/2019 0825 Last data filed at 07/23/2019 0600 Gross  per 24 hour  Intake 240 ml  Output 1250 ml  Net -1010 ml    Physical Exam  Gen:- Awake Alert, oriented only to person; non toxic, well appearing,  HEENT:- [place].AT, No sclera icterus Neck-Supple Neck,No JVD,.  Lungs CTAB, without overt wheeze, rales, rhonchi  CV- S1, S2 normal, irregularly irregular, 2/6 SM, prior sternotomy scar Abd-  +ve B.Sounds, Abd Soft, No tenderness,    Extremity/Skin:- trace  edema, pedal pulses present  Psych-affect is appropriate, oriented x3 Neuro-speech deficit which is not new, no new focal deficits, no tremors   Data Review:   Micro Results Recent Results (from the past 240 hour(s))  SARS Coronavirus 2 [facility] order, Performed in [facility] hospital lab) Nasopharyngeal Nasopharyngeal Swab     Status: None   Collection Time: 07/18/19 12:26 PM   Specimen: Nasopharyngeal Swab  Result Value Ref Range Status   SARS Coronavirus 2  NEGATIVE NEGATIVE Final    Comment: (NOTE) If result is NEGATIVE SARS-CoV-2 target nucleic acids are NOT DETECTED. The SARS-CoV-2 RNA is generally detectable in upper and lower  respiratory specimens during the acute phase of infection. The lowest  concentration of SARS-CoV-2 viral copies this assay can detect is 250  copies / mL. A negative result does not preclude SARS-CoV-2 infection  and should not be used as the sole basis for treatment or other  patient management decisions.  A negative result may occur with  improper specimen collection / handling, submission of specimen other  than nasopharyngeal swab, presence of viral mutation(s) within the  areas targeted by this assay, and inadequate number of viral copies  (<250 copies / mL). A negative result must be combined with clinical  observations, patient history, and epidemiological information. If result is POSITIVE SARS-CoV-2 target nucleic acids are DETECTED. The SARS-CoV-2 RNA is generally detectable in upper and lower  respiratory specimens dur ing the acute phase of infection.  Positive  results are indicative of active infection with SARS-CoV-2.  Clinical  correlation with patient history and other diagnostic information is  necessary to determine patient infection status.  Positive results do  not rule out bacterial infection or co-infection with other viruses. If result is PRESUMPTIVE POSTIVE SARS-CoV-2 nucleic acids MAY BE PRESENT.   A presumptive positive result was obtained on the submitted specimen  and confirmed on repeat testing.  While 2019 novel coronavirus  (SARS-CoV-2) nucleic acids may be present in the submitted sample  additional confirmatory testing may be necessary for epidemiological  and / or clinical management purposes  to differentiate between  SARS-CoV-2 and other Sarbecovirus currently known to infect humans.  If clinically indicated additional testing with an alternate test  methodology [phone])  is advised. The SARS-CoV-2 RNA is generally  detectable in upper and lower respiratory sp ecimens during the acute  phase of infection. The expected result is Negative. Fact Sheet for Patients:  StrictlyIdeas.no Fact Sheet for Healthcare Providers: BankingDealers.co.za This test is not yet approved or cleared by the [place] FDA and has been authorized for detection and/or diagnosis of SARS-CoV-2 by FDA under an Emergency Use Authorization (EUA).  This EUA will remain in effect (meaning this test can be used) for the duration of the COVID-19 declaration under Section 564(b)(1) of the Act, 21 U.S.C. section 360bbb-3(b)(1), unless the authorization is terminated or revoked sooner. Performed at [facility], [address] [place], [place] [address]     Radiology Reports Dg Chest 2 View  Result Date: 07/18/2019 CLINICAL DATA:  Short  of breath EXAM: CHEST - 2 VIEW COMPARISON:  06/30/2019 FINDINGS: Postop CABG. Interval development of diffuse bilateral airspace disease with the appearance of edema. Interval development of small pleural effusion. Mild atelectasis in the bases. Severe lumbar compression fracture of L1 appears chronic and unchanged from prior studies. IMPRESSION: Diffuse bilateral airspace disease with small effusion consistent with congestive heart failure and edema. Electronically Signed   By: Franchot Gallo M.D.   On: 07/18/2019 11:15   Dg Chest 2 View  Result Date: 06/30/2019 CLINICAL DATA:  Chest pain. EXAM: CHEST - 2 VIEW COMPARISON:  Radiographs of June 28, 2019. FINDINGS: Stable cardiomegaly. Status post coronary bypass graft. Atherosclerosis of thoracic aorta is noted. No pneumothorax is noted. Left lung is clear. Minimal right basilar subsegmental atelectasis is noted with minimal right pleural effusion. The visualized skeletal structures are unremarkable. IMPRESSION: Minimal right basilar subsegmental atelectasis with  minimal right pleural effusion. Aortic Atherosclerosis (ICD10-I70.0). Electronically Signed   By: Marijo Conception M.D.   On: 06/30/2019 13:48   Dg Chest 2 View  Result Date: 06/28/2019 CLINICAL DATA:  Weakness.  Left lower rib pain.  Fall last week. EXAM: CHEST - 2 VIEW COMPARISON:  Chest x-ray dated January 20, 2019. FINDINGS: Stable cardiomediastinal silhouette status post CABG. Atherosclerotic calcification of the aortic arch. Normal pulmonary vascularity. No focal consolidation, pleural effusion, or pneumothorax. No acute osseous abnormality. Chronic severe L1 compression deformity. IMPRESSION: No active cardiopulmonary disease. Electronically Signed   By: Titus Dubin M.D.   On: 06/28/2019 17:30   Ct Head Wo Contrast  Result Date: 06/29/2019 CLINICAL DATA:  85 year old female with multiple falls. EXAM: CT HEAD WITHOUT CONTRAST TECHNIQUE: Contiguous axial images were obtained from the base of the skull through the vertex without intravenous contrast. COMPARISON:  Head CT dated 01/31/2019 FINDINGS: Brain: There is moderate age-related atrophy and chronic microvascular ischemic changes. 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 There is no acute intracranial hemorrhage. No mass effect or midline shift. No extra-axial fluid collection. Vascular: No hyperdense vessel or unexpected calcification. Skull: Normal. Negative for fracture or focal lesion. Sinuses/Orbits: Diffuse mucoperiosteal thickening of paranasal sinuses. No air-fluid level. The mastoid air cells are clear. Other: None IMPRESSION: 1. No acute intracranial hemorrhage. 2. Age-related atrophy and chronic microvascular ischemic changes. 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 Electronically Signed   By: Anner Crete M.D.   On: 06/29/2019 01:24   Nm Myocar Multi W/spect W/wall Motion / Ef  Result Date: 06/30/2019  There was no ST segment deviation noted during stress.  Defect  1: There is a small defect of mild severity.  This is a low risk study.  Nuclear stress EF: 57%.  No significant reversible ischemia. LVEF 57% with normal wall motion. This is a low risk study.   Dg Hips Bilat W Or Wo Pelvis 2 Views  Result Date: 06/28/2019 CLINICAL DATA:  Fall 1 week ago with persistent left-sided hip pain, initial encounter EXAM: DG HIP (WITH OR WITHOUT PELVIS) 4V BILAT COMPARISON:  10/26/2016 FINDINGS: The pelvic ring is intact. Diffuse vascular calcifications are seen. No acute fracture or dislocation is noted. No soft tissue abnormality is seen. IMPRESSION: No acute abnormality noted. Electronically Signed   By: Inez Catalina M.D.   On: 06/28/2019 18:03     CBC Recent Labs  Lab 07/18/19 1004 07/19/19 0434 07/23/19 0348  WBC 6.4 5.1 4.8  HGB 9.2* 9.1* 9.9*  HCT 30.2* 29.6* 31.5*  PLT 217 200 227  MCV 95.0 96.1 93.8  MCH 28.9 29.5 29.5  MCHC 30.5 30.7 31.4  RDW 14.3 14.1 13.7  LYMPHSABS 0.6*  --   --   MONOABS 0.3  --   --   EOSABS 0.1  --   --   BASOSABS 0.1  --   --     Chemistries  Recent Labs  Lab 07/18/19 1004 07/19/19 0434 07/20/19 0812 07/23/19 0348  NA 137 138 137 138  K 5.2* 4.8 4.5 4.9  CL 103 103 101 101  CO2 24 28 27 27   GLUCOSE 171* 132* 84 66*  BUN 37* 38* 42* 49*  CREATININE 1.75* 1.64* 1.63* 2.01*  CALCIUM 9.3 9.3 9.6 9.4  MG  --   --  2.4  --   AST 17  --   --   --   ALT 17  --   --   --   ALKPHOS 41  --   --   --   BILITOT 0.7  --   --   --    ------------------------------------------------------------------------------------------------------------------ No results for input(s): CHOL, HDL, LDLCALC, TRIG, CHOLHDL, LDLDIRECT in the last 72 hours.  Lab Results  Component Value Date   HGBA1C 8.5 (H) 06/29/2019   ------------------------------------------------------------------------------------------------------------------ No results for input(s): TSH, T4TOTAL, T3FREE, THYROIDAB in the last 72 hours.  Invalid input(s):  FREET3 ------------------------------------------------------------------------------------------------------------------ No results for input(s): VITAMINB12, FOLATE, FERRITIN, TIBC, IRON, RETICCTPCT in the last 72 hours.  Coagulation profile No results for input(s): INR, PROTIME in the last 168 hours.  No results for input(s): DDIMER in the last 72 hours.  Cardiac Enzymes No results for input(s): CKMB, TROPONINI, MYOGLOBIN in the last 168 hours.  Invalid input(s): CK ------------------------------------------------------------------------------------------------------------------    Component Value Date/Time   BNP 283.0 (H) 07/18/2019 1004   Little Ishikawa DO on 07/23/2019 at 8:25 AM  Go to [email or web address] - for contact info  [facility] - Office  [phone]

## 2019-07-24 ENCOUNTER — Ambulatory Visit: Payer: Medicare Other | Admitting: Nurse Practitioner

## 2019-07-24 ENCOUNTER — Ambulatory Visit: Payer: Self-pay | Admitting: Family Medicine

## 2019-07-24 ENCOUNTER — Inpatient Hospital Stay (HOSPITAL_COMMUNITY)
Admission: EM | Disposition: A | Discharge status: Home / Self Care | Payer: Self-pay | Source: Home / Self Care | Attending: Family Medicine

## 2019-07-24 ENCOUNTER — Encounter (HOSPITAL_COMMUNITY): Payer: Self-pay | Admitting: Internal Medicine

## 2019-07-24 DIAGNOSIS — I495 Sick sinus syndrome: Secondary | ICD-10-CM

## 2019-07-24 HISTORY — PX: PACEMAKER IMPLANT: EP1218

## 2019-07-24 LAB — CBC
HCT: 28.5 % — ABNORMAL LOW (ref 36.0–46.0)
Hemoglobin: 9 g/dL — ABNORMAL LOW (ref 12.0–15.0)
MCH: 29.6 pg (ref 26.0–34.0)
MCHC: 31.6 g/dL (ref 30.0–36.0)
MCV: 93.8 fL (ref 80.0–100.0)
Platelets: 190 10*3/uL (ref 150–400)
RBC: 3.04 MIL/uL — ABNORMAL LOW (ref 3.87–5.11)
RDW: 13.5 % (ref 11.5–15.5)
WBC: 4.2 10*3/uL (ref 4.0–10.5)
nRBC: 0 % (ref 0.0–0.2)

## 2019-07-24 LAB — BASIC METABOLIC PANEL
Anion gap: 9 (ref 5–15)
BUN: 55 mg/dL — ABNORMAL HIGH (ref 8–23)
CO2: 26 mmol/L (ref 22–32)
Calcium: 9.1 mg/dL (ref 8.9–10.3)
Chloride: 102 mmol/L (ref 98–111)
Creatinine, Ser: 1.97 mg/dL — ABNORMAL HIGH (ref 0.44–1.00)
GFR calc Af Amer: 26 mL/min — ABNORMAL LOW (ref >60)
GFR calc non Af Amer: 23 mL/min — ABNORMAL LOW (ref >60)
Glucose, Bld: 112 mg/dL — ABNORMAL HIGH (ref 70–99)
Potassium: 4.5 mmol/L (ref 3.5–5.1)
Sodium: 137 mmol/L (ref 135–145)

## 2019-07-24 LAB — GLUCOSE, CAPILLARY
Glucose-Capillary: 111 mg/dL — ABNORMAL HIGH (ref 70–99)
Glucose-Capillary: 135 mg/dL — ABNORMAL HIGH (ref 70–99)
Glucose-Capillary: 222 mg/dL — ABNORMAL HIGH (ref 70–99)
Glucose-Capillary: 191 mg/dL — ABNORMAL HIGH (ref 70–99)

## 2019-07-24 SURGERY — PACEMAKER IMPLANT
Anesthesia: LOCAL

## 2019-07-24 MED ORDER — SODIUM CHLORIDE 0.9 % IV SOLN
INTRAVENOUS | Status: AC
  Filled 2019-07-24: qty 2

## 2019-07-24 MED ORDER — LIDOCAINE HCL (PF) 1 % IJ SOLN
INTRAMUSCULAR | Status: DC | PRN
  Administered 2019-07-24: 30 mL

## 2019-07-24 MED ORDER — HEPARIN (PORCINE) IN NACL 1000-0.9 UT/500ML-% IV SOLN
INTRAVENOUS | Status: AC
  Filled 2019-07-24: qty 500

## 2019-07-24 MED ORDER — MIDAZOLAM HCL 5 MG/5ML IJ SOLN
INTRAMUSCULAR | Status: AC
  Filled 2019-07-24: qty 5

## 2019-07-24 MED ORDER — HEPARIN (PORCINE) IN NACL 1000-0.9 UT/500ML-% IV SOLN
INTRAVENOUS | Status: DC | PRN
  Administered 2019-07-24: 500 mL

## 2019-07-24 MED ORDER — CEFAZOLIN SODIUM-DEXTROSE 2-3 GM-%(50ML) IV SOLR: INTRAVENOUS | Status: DC | PRN

## 2019-07-24 MED ORDER — CEFAZOLIN SODIUM-DEXTROSE 2-3 GM-%(50ML) IV SOLR
INTRAVENOUS | Status: AC | PRN
  Administered 2019-07-24: 2 g via INTRAVENOUS

## 2019-07-24 MED ORDER — CEFAZOLIN SODIUM-DEXTROSE 2-4 GM/100ML-% IV SOLN
INTRAVENOUS | Status: AC
  Filled 2019-07-24: qty 100

## 2019-07-24 MED ORDER — LIDOCAINE HCL (PF) 1 % IJ SOLN
INTRAMUSCULAR | Status: AC
  Filled 2019-07-24: qty 30

## 2019-07-24 MED ORDER — FENTANYL CITRATE (PF) 100 MCG/2ML IJ SOLN
INTRAMUSCULAR | Status: DC | PRN
  Administered 2019-07-24: 12.5 ug via INTRAVENOUS

## 2019-07-24 MED ORDER — MIDAZOLAM HCL 5 MG/5ML IJ SOLN
INTRAMUSCULAR | Status: DC | PRN
  Administered 2019-07-24: 1 mg via INTRAVENOUS

## 2019-07-24 MED ORDER — FENTANYL CITRATE (PF) 100 MCG/2ML IJ SOLN
INTRAMUSCULAR | Status: AC
  Filled 2019-07-24: qty 2

## 2019-07-24 SURGICAL SUPPLY — 8 items
CABLE SURGICAL S-101-97-12 (CABLE) ×2 IMPLANT
IPG PACE AZUR XT DR MRI W1DR01 (Pacemaker) IMPLANT
LEAD CAPSURE NOVUS 45CM (Lead) ×1 IMPLANT
LEAD CAPSURE NOVUS 5076-52CM (Lead) ×1 IMPLANT
PACE AZURE XT DR MRI W1DR01 (Pacemaker) ×2 IMPLANT
PAD PRO RADIOLUCENT 2001M-C (PAD) ×2 IMPLANT
SHEATH CLASSIC 7F (SHEATH) ×2 IMPLANT
TRAY PACEMAKER INSERTION (PACKS) ×2 IMPLANT

## 2019-07-24 NOTE — Progress Notes (Addendum)
Progress Note  Patient Name: [name] Date of Encounter: 07/24/2019  Primary Cardiologist: Minus Breeding, MD   Subjective   No chest pain, sob, or palpitations.   Inpatient Medications    Scheduled Meds: . aspirin  81 mg Oral Q breakfast  . atorvastatin  20 mg Oral QHS  . clopidogrel  75 mg Oral Daily  . enoxaparin (LOVENOX) injection  30 mg Subcutaneous Q24H  . ferrous sulfate  324 mg Oral Daily  . [START ON 07/25/2019] furosemide  20 mg Oral q morning - 10a  . gentamicin irrigation  80 mg Irrigation On Call  . hydrALAZINE  25 mg Oral TID  . insulin aspart  0-5 Units Subcutaneous QHS  . insulin aspart  0-9 Units Subcutaneous TID WC  . insulin detemir  10 Units Subcutaneous q morning - 10a  . metoprolol tartrate  25 mg Oral BID  . pantoprazole  40 mg Oral Daily  . PARoxetine  20 mg Oral q morning - 10a  . polyethylene glycol  17 g Oral Daily  . sodium chloride flush  3 mL Intravenous Q12H   Continuous Infusions: . sodium chloride 10 mL/hr at 07/24/19 0700  . sodium chloride    . sodium chloride 75 mL/hr at 07/23/19 2130  . sodium chloride 50 mL/hr at 07/24/19 0654  .  ceFAZolin (ANCEF) IV     PRN Meds: sodium chloride, acetaminophen **OR** acetaminophen, albuterol, hydrALAZINE, ondansetron **OR** ondansetron (ZOFRAN) IV, polyethylene glycol, sodium chloride flush, traZODone   Vital Signs    Vitals:   07/23/19 1737 07/23/19 2045 07/23/19 2125 07/24/19 0636  BP: (!) 142/68  (!) 127/57 (!) 137/98  Pulse:  93  (!) 105  Resp:      Temp:  98.3 F (36.8 C)  97.7 F (36.5 C)  TempSrc:  Oral  Oral  SpO2:  96%  96%  Weight:    68 kg  Height:        Intake/Output Summary (Last 24 hours) at 07/24/2019 0817 Last data filed at 07/24/2019 0724 Gross per 24 hour  Intake 1878.3 ml  Output 1500 ml  Net 378.3 ml   Filed Weights   07/22/19 0409 07/23/19 0500 07/24/19 0636  Weight: 64.4 kg 65.9 kg 68 kg    Telemetry    Atrial fib with a CVR - Personally  Reviewed  ECG    No new EKSs - Personally Reviewed  Physical Exam   GEN: No acute distress.   Neck: No JVD Cardiac: irreg-irreg, 1-2/6 SM, rubs, or gallops.  Respiratory: CTA b/l GI: Soft, nontender, non-distended  MS: No edema; No deformity. Neuro:  Nonfocal  Psych: Normal affect   Labs    Chemistry Recent Labs  Lab 07/18/19 1004  07/20/19 0812 07/23/19 0348 07/24/19 0513  NA 137   < > 137 138 137  K 5.2*   < > 4.5 4.9 4.5  CL 103   < > 101 101 102  CO2 24   < > 27 27 26   GLUCOSE 171*   < > 84 66* 112*  BUN 37*   < > 42* 49* 55*  CREATININE 1.75*   < > 1.63* 2.01* 1.97*  CALCIUM 9.3   < > 9.6 9.4 9.1  PROT 6.7  --   --   --   --   ALBUMIN 3.5  --   --   --   --   AST 17  --   --   --   --  ALT 17  --   --   --   --   ALKPHOS 41  --   --   --   --   BILITOT 0.7  --   --   --   --   GFRNONAA 26*   < > 28* 22* 23*  GFRAA 30*   < > 33* 26* 26*  ANIONGAP 10   < > 9 10 9    < > = values in this interval not displayed.     Hematology Recent Labs  Lab 07/19/19 0434 07/23/19 0348 07/24/19 0513  WBC 5.1 4.8 4.2  RBC 3.08* 3.36* 3.04*  HGB 9.1* 9.9* 9.0*  HCT 29.6* 31.5* 28.5*  MCV 96.1 93.8 93.8  MCH 29.5 29.5 29.6  MCHC 30.7 31.4 31.6  RDW 14.1 13.7 13.5  PLT 200 227 190    Cardiac EnzymesNo results for input(s): TROPONINI in the last 168 hours. No results for input(s): TROPIPOC in the last 168 hours.   BNP Recent Labs  Lab 07/18/19 1004  BNP 283.0*     DDimer No results for input(s): DDIMER in the last 168 hours.   Radiology    No results found.  Cardiac Studies   Echocardiogram: 06/29/2019 IMPRESSIONS 1. The left ventricle has normal systolic function with an ejection fraction of 60-65%. The cavity size was normal. There is moderate concentric left ventricular hypertrophy. Left ventricular diastolic Doppler parameters are consistent with  pseudonormalization. Elevated mean left atrial pressure. 2. The right ventricle has normal systolic  function. The cavity was normal. There is no increase in right ventricular wall thickness. Right ventricular systolic pressure is mildly elevated with an estimated pressure of 36.6 mmHg. 3. Left atrial size was severely dilated. 4. The mitral valve is degenerative. Mild thickening of the mitral valve leaflet. Mild calcification of the mitral valve leaflet. There is severe mitral annular calcification present. Mitral valve regurgitation is mild to moderate by color flow Doppler. Moderate mitral valve stenosis. 5. The aortic valve is tricuspid. Moderate thickening of the aortic valve. Moderate calcification of the aortic valve. Moderate stenosis of the aortic valve. 6. The aorta is normal in size and structure. 7. The inferior vena cava was dilated in size with >50% respiratory variability. 8. When compared to the prior study: 10/08/2018, there is very slight worsening of the aortic stenosis. Mitral valve gradients appear lower due to bradycardia.  Patient Profile     85 y.o. female admitted with uncontrolled atrial fib/h/o tachybrady  Assessment & Plan    1. Atrial fib w/RVR (new)     Rates generally with fair control 90's, no bradycardia last 24 hours     CHA2DS2Vasc is 8      She has h/o hematochezia earlier this year and known internal hemorrhoids, patient in d/w cardiology team at [facility] was not inclined to be on a/c deferred to her daughter,  2. Symptomatic bradycardia     Required discontinuation of meds at her last admission 3. Tachy-brady     Planned for PPM today with Dr. Lovena Le  I answered some leftover questions for the patient this AM, she remains agreeable to proceed  4. UTI (ecoli)     On abx, afebrile, wbc is wnl     C/w IM  5. Acute/chronic CHF (diastolic)      Cumulative neg -[id number]     Creat similar to yesterday, lasix stopped     follow  6. VHD     Mod AS/MS/MR  7. HTN     No changes today  8. CKD (III)     Creat 1.97 today, baseline looks towards  1.3  For questions or updates, please contact [place] Please consult [email or web address] for contact info under Cardiology/STEMI.      Signed, Baldwin Jamaica, PA-C  07/24/2019, 8:17 AM  [name], female   DOB: 1931/05/10, 85 y.o.   MRN: [id number]  EP Attending  Patient seen and examined. Agree with the findings as noted above. The patient is stable for PPM insertion due to symptomatic tachy brady syndrome.   Mikle Bosworth.D.

## 2019-07-24 NOTE — H&P (View-Only) (Signed)
Progress Note  Patient Name: [name] Date of Encounter: 07/24/2019  Primary Cardiologist: Minus Breeding, MD   Subjective   No chest pain, sob, or palpitations.   Inpatient Medications    Scheduled Meds: . aspirin  81 mg Oral Q breakfast  . atorvastatin  20 mg Oral QHS  . clopidogrel  75 mg Oral Daily  . enoxaparin (LOVENOX) injection  30 mg Subcutaneous Q24H  . ferrous sulfate  324 mg Oral Daily  . [START ON 07/25/2019] furosemide  20 mg Oral q morning - 10a  . gentamicin irrigation  80 mg Irrigation On Call  . hydrALAZINE  25 mg Oral TID  . insulin aspart  0-5 Units Subcutaneous QHS  . insulin aspart  0-9 Units Subcutaneous TID WC  . insulin detemir  10 Units Subcutaneous q morning - 10a  . metoprolol tartrate  25 mg Oral BID  . pantoprazole  40 mg Oral Daily  . PARoxetine  20 mg Oral q morning - 10a  . polyethylene glycol  17 g Oral Daily  . sodium chloride flush  3 mL Intravenous Q12H   Continuous Infusions: . sodium chloride 10 mL/hr at 07/24/19 0700  . sodium chloride    . sodium chloride 75 mL/hr at 07/23/19 2130  . sodium chloride 50 mL/hr at 07/24/19 0654  .  ceFAZolin (ANCEF) IV     PRN Meds: sodium chloride, acetaminophen **OR** acetaminophen, albuterol, hydrALAZINE, ondansetron **OR** ondansetron (ZOFRAN) IV, polyethylene glycol, sodium chloride flush, traZODone   Vital Signs    Vitals:   07/23/19 1737 07/23/19 2045 07/23/19 2125 07/24/19 0636  BP: (!) 142/68  (!) 127/57 (!) 137/98  Pulse:  93  (!) 105  Resp:      Temp:  98.3 F (36.8 C)  97.7 F (36.5 C)  TempSrc:  Oral  Oral  SpO2:  96%  96%  Weight:    68 kg  Height:        Intake/Output Summary (Last 24 hours) at 07/24/2019 0817 Last data filed at 07/24/2019 0724 Gross per 24 hour  Intake 1878.3 ml  Output 1500 ml  Net 378.3 ml   Filed Weights   07/22/19 0409 07/23/19 0500 07/24/19 0636  Weight: 64.4 kg 65.9 kg 68 kg    Telemetry    Atrial fib with a CVR - Personally  Reviewed  ECG    No new EKSs - Personally Reviewed  Physical Exam   GEN: No acute distress.   Neck: No JVD Cardiac: irreg-irreg, 1-2/6 SM, rubs, or gallops.  Respiratory: CTA b/l GI: Soft, nontender, non-distended  MS: No edema; No deformity. Neuro:  Nonfocal  Psych: Normal affect   Labs    Chemistry Recent Labs  Lab 07/18/19 1004  07/20/19 0812 07/23/19 0348 07/24/19 0513  NA 137   < > 137 138 137  K 5.2*   < > 4.5 4.9 4.5  CL 103   < > 101 101 102  CO2 24   < > 27 27 26   GLUCOSE 171*   < > 84 66* 112*  BUN 37*   < > 42* 49* 55*  CREATININE 1.75*   < > 1.63* 2.01* 1.97*  CALCIUM 9.3   < > 9.6 9.4 9.1  PROT 6.7  --   --   --   --   ALBUMIN 3.5  --   --   --   --   AST 17  --   --   --   --  ALT 17  --   --   --   --   ALKPHOS 41  --   --   --   --   BILITOT 0.7  --   --   --   --   GFRNONAA 26*   < > 28* 22* 23*  GFRAA 30*   < > 33* 26* 26*  ANIONGAP 10   < > 9 10 9    < > = values in this interval not displayed.     Hematology Recent Labs  Lab 07/19/19 0434 07/23/19 0348 07/24/19 0513  WBC 5.1 4.8 4.2  RBC 3.08* 3.36* 3.04*  HGB 9.1* 9.9* 9.0*  HCT 29.6* 31.5* 28.5*  MCV 96.1 93.8 93.8  MCH 29.5 29.5 29.6  MCHC 30.7 31.4 31.6  RDW 14.1 13.7 13.5  PLT 200 227 190    Cardiac EnzymesNo results for input(s): TROPONINI in the last 168 hours. No results for input(s): TROPIPOC in the last 168 hours.   BNP Recent Labs  Lab 07/18/19 1004  BNP 283.0*     DDimer No results for input(s): DDIMER in the last 168 hours.   Radiology    No results found.  Cardiac Studies   Echocardiogram: 06/29/2019 IMPRESSIONS 1. The left ventricle has normal systolic function with an ejection fraction of 60-65%. The cavity size was normal. There is moderate concentric left ventricular hypertrophy. Left ventricular diastolic Doppler parameters are consistent with  pseudonormalization. Elevated mean left atrial pressure. 2. The right ventricle has normal systolic  function. The cavity was normal. There is no increase in right ventricular wall thickness. Right ventricular systolic pressure is mildly elevated with an estimated pressure of 36.6 mmHg. 3. Left atrial size was severely dilated. 4. The mitral valve is degenerative. Mild thickening of the mitral valve leaflet. Mild calcification of the mitral valve leaflet. There is severe mitral annular calcification present. Mitral valve regurgitation is mild to moderate by color flow Doppler. Moderate mitral valve stenosis. 5. The aortic valve is tricuspid. Moderate thickening of the aortic valve. Moderate calcification of the aortic valve. Moderate stenosis of the aortic valve. 6. The aorta is normal in size and structure. 7. The inferior vena cava was dilated in size with >50% respiratory variability. 8. When compared to the prior study: 10/08/2018, there is very slight worsening of the aortic stenosis. Mitral valve gradients appear lower due to bradycardia.  Patient Profile     85 y.o. female admitted with uncontrolled atrial fib/h/o tachybrady  Assessment & Plan    1. Atrial fib w/RVR (new)     Rates generally with fair control 90's, no bradycardia last 24 hours     CHA2DS2Vasc is 8      She has h/o hematochezia earlier this year and known internal hemorrhoids, patient in d/w cardiology team at [facility] was not inclined to be on a/c deferred to her daughter,  2. Symptomatic bradycardia     Required discontinuation of meds at her last admission 3. Tachy-brady     Planned for PPM today with Dr. Lovena Le  I answered some leftover questions for the patient this AM, she remains agreeable to proceed  4. UTI (ecoli)     On abx, afebrile, wbc is wnl     C/w IM  5. Acute/chronic CHF (diastolic)      Cumulative neg -2953     Creat similar to yesterday, lasix stopped     follow  6. VHD     Mod AS/MS/MR  7. HTN     No changes today  8. CKD (III)     Creat 1.97 today, baseline looks towards  1.3  For questions or updates, please contact [place] Please consult [email or web address] for contact info under Cardiology/STEMI.      Signed, Baldwin [place], PA-C  07/24/2019, 8:17 AM  [name], female   DOB: 12/01/1931, 85 y.o.   MRN: [id number]  EP Attending  Patient seen and examined. Agree with the findings as noted above. The patient is stable for PPM insertion due to symptomatic tachy brady syndrome.   Mikle Bosworth.D.

## 2019-07-24 NOTE — Progress Notes (Signed)
Patient Demographics:    [name], is a 85 y.o. female, DOB - 01-Apr-1931, AST:[id number]  Admit date - 07/18/2019   Admitting Physician Courage Denton Brick, MD  Outpatient Primary MD for the patient is [name], MD  LOS - 5   Chief Complaint  Patient presents with  . Abdominal Pain      Brief Summary:- 85 y.o. female with PMHx relevant forCAD s/p CABG (2006), carotic artery stenosis, HTN with hx of hypertensive urgency, CKD stage III, DM, dyslipidemia, GERD, and chronic diastolic heart failure, prior CVA with residual slurred speech,  aortic stenosis and Mitral stenosis, CKD 3, asthma, hypertension and diabetes recent history of bradycardia with junctional bradycardia readmitted on 07/18/2019 with tachy-arrhythmia vs new onset A. Fib - symptomatic with ongoing shortness of breath. Transfer to [facility] on 07/21/19 for EP evaluation for possible tachybradycardia syndrome and possible pacemaker placement.   Subjective:   No acute issues or events overnight, patient declines chest pain, shortness of breath, nausea, vomiting, diarrhea, constipation, headache, fevers, chills.   Assessment  & Plan :    Principal Problem:   New onset a-fib ([place]) Active Problems:   Type 2 diabetes mellitus with insulin therapy ([place])   HTN (hypertension)   Anemia of chronic disease   CAD (coronary artery disease)   (HFpEF) heart failure with preserved ejection fraction ([place])   New onset symptomatic A. Fib, POA; concern for tachy/brady syndrome. Rate currently well controlled -Recent episode of prolonged symptomatic bradycardia with heart rate in the 40s (junctional/ectopic atrial rhythm) -Concurrent moderate aortic stenosis and moderate mitral stenosis -Cardiology following - appreciate insight/recs -Continue metoprolol 25mg  bid  -Pacemaker placement tentatively planned this afternoon 07/24/19 @1730  -CHA2DS2- VASc  score   is = 7(CHF, age x2, CVA x 2, HTN, Female),   Which is  equal to =  11.2 % annual risk of stroke. This patients CHA2DS2-VASc Score and unadjusted Ischemic Stroke Rate (% per year) is equal to 11.2 % stroke rate/year from a score of 7 -However patient has had recent hematochezia in March 2020 presumably from bleeding internal hemorrhoids, please see full colonoscopy report -History of recurrent falls due to bradycardia/arrhythmias -Also patient is currently on aspirin and Plavix for history of CAD and CVA --Defer decision on full anticoagulation/Coumadin therapy to cardiology team and pt's daughter -Not a candidate for NOAC due to moderate mitral stenosis (will have to be on Coumadin) -Discussed with patient's daughter (ms [name]) risk Versus benefits of anticoagulation and she would like to talk to primary cardiologist (Dr Alba Cory) before making a final decision  HFpEF/ Chronic diastolic heart failure; not in acute exacerbation -Hold Lasix as patient will be n.p.o. for possible EP evaluation - restart Tuesday 18th -Continue daily weights, strict I's and O's -EF is 60 to 65% per echo from 06/26/2019 -Fluid balance remains net negative -Weight is down from 74kg on 07/18/2019 - continue to follow  CAD hx -  -Status post prior coronary angioplasty with 5stentsprior to 2005,CABG 2006,follows with Dr. Percival Spanish -Troponin is not elevated, no ACS type symptoms -Continue aspirin, lipitor, plavix, metoprolol  Acute kidney injury on CKD stage - III -Suspect worsening cardiac failure due to poor renal perfusion in the setting of CHF and tachyarrhythmia   -Creatinine baseline 1.3-1.4  Currently 1.97 -Hold metformin -Lasix on hold - continue gentle fluids @75cc  - continue until surgery given NPO status overnight/today.  IDDM2 - Diabetes Mellitus-  -A1c 8.5 weeks ago reflecting uncontrolled diabetes - -Hold Metformin due to AKI -Decrease Levemir to 10 units from 24 units daily due to NPO  status. -Use Novolog/Humalog Sliding scale insulin with ACHS POC glucose   CVA-with residual speech deficit- patient mostly independent of her ADLs.  Continue aspirin and Lipitor  Chronic anemia likely of CKD/chronic disease Hemoglobin 9.1, which is close to patient's baseline -No evidence of active bleeding at this time  Moderate Aortic Stenosis and Moderate Mitral Stenosis Echo from 06/29/2019 noted,  cardiology consult appreciated  Presumed UTI, POA, resolved Completed IV Rocephin, recent urine culture with pansensitive E. coli  Disposition/Need for in-Hospital Stay- patient unable to be discharged at this time due to some dyspnea on exertion, needs to monitor heart rate with initiation of beta-blocker-given recent severe bradycardia while on beta-blocker---  will need EP evaluation for tachy-brady syndrome and consideration of PPM placement.    Code Status : Full  Disposition Plan  : EP evaluation for possible tachybradycardia syndrome and possible pacemaker placement 07/24/19 - pending disposition home pending clinical status/improvement Consults  :  cardiology DVT Prophylaxis  :  Lovenox   SCDs   Lab Results  Component Value Date   PLT 190 07/24/2019    Inpatient Medications  Scheduled Meds: . aspirin  81 mg Oral Q breakfast  . atorvastatin  20 mg Oral QHS  . clopidogrel  75 mg Oral Daily  . enoxaparin (LOVENOX) injection  30 mg Subcutaneous Q24H  . ferrous sulfate  324 mg Oral Daily  . [START ON 07/25/2019] furosemide  20 mg Oral q morning - 10a  . gentamicin irrigation  80 mg Irrigation On Call  . hydrALAZINE  25 mg Oral TID  . insulin aspart  0-5 Units Subcutaneous QHS  . insulin aspart  0-9 Units Subcutaneous TID WC  . insulin detemir  10 Units Subcutaneous q morning - 10a  . metoprolol tartrate  25 mg Oral BID  . pantoprazole  40 mg Oral Daily  . PARoxetine  20 mg Oral q morning - 10a  . polyethylene glycol  17 g Oral Daily  . sodium chloride flush  3 mL  Intravenous Q12H   Continuous Infusions: . sodium chloride 10 mL/hr at 07/24/19 0700  . sodium chloride    . sodium chloride 75 mL/hr at 07/23/19 2130  . sodium chloride 50 mL/hr at 07/24/19 0654  .  ceFAZolin (ANCEF) IV     PRN Meds:.sodium chloride, acetaminophen **OR** acetaminophen, albuterol, hydrALAZINE, ondansetron **OR** ondansetron (ZOFRAN) IV, polyethylene glycol, sodium chloride flush, traZODone   Anti-infectives (From admission, onward)   Start     Dose/Rate Route Frequency Ordered Stop   07/23/19 1415  gentamicin (GARAMYCIN) 80 mg in sodium chloride 0.9 % 500 mL irrigation     80 mg Irrigation On call 07/23/19 1330 07/24/19 1415   07/23/19 1345  ceFAZolin (ANCEF) IVPB 2g/100 mL premix     2 g 200 mL/hr over 30 Minutes Intravenous On call 07/23/19 1330 07/24/19 1345   07/19/19 1000  cefTRIAXone (ROCEPHIN) 1 g in sodium chloride 0.9 % 100 mL IVPB     1 g 200 mL/hr over 30 Minutes Intravenous Every 24 hours 07/19/19 0946 07/21/19 0905        Objective:   Vitals:   07/23/19 1737 07/23/19 2045 07/23/19 2125 07/24/19 0636  BP: Marland Kitchen)  142/68  (!) 127/57 (!) 137/98  Pulse:  93  (!) 105  Resp:      Temp:  98.3 F (36.8 C)  97.7 F (36.5 C)  TempSrc:  Oral  Oral  SpO2:  96%  96%  Weight:    68 kg  Height:        Wt Readings from Last 3 Encounters:  07/24/19 68 kg  06/30/19 91.2 kg  02/15/19 67.1 kg     Intake/Output Summary (Last 24 hours) at 07/24/2019 0809 Last data filed at 07/24/2019 0724 Gross per 24 hour  Intake 1878.3 ml  Output 1500 ml  Net 378.3 ml    Physical Exam  Gen:- Awake Alert, oriented only to person; non toxic, well appearing,  HEENT:- [place].AT, No sclera icterus Neck-Supple Neck,No JVD,.  Lungs CTAB, without overt wheeze, rales, rhonchi  CV- S1, S2 normal, irregularly irregular, 2/6 SM, prior sternotomy scar Abd-  +ve B.Sounds, Abd Soft, No tenderness,    Extremity/Skin:- trace  edema, pedal pulses present  Neuro-speech deficit which is  not new, no new focal deficits, no tremors   Data Review:   Micro Results Recent Results (from the past 240 hour(s))  SARS Coronavirus 2 [facility] order, Performed in [facility] hospital lab) Nasopharyngeal Nasopharyngeal Swab     Status: None   Collection Time: 07/18/19 12:26 PM   Specimen: Nasopharyngeal Swab  Result Value Ref Range Status   SARS Coronavirus 2 NEGATIVE NEGATIVE Final    Comment: (NOTE) If result is NEGATIVE SARS-CoV-2 target nucleic acids are NOT DETECTED. The SARS-CoV-2 RNA is generally detectable in upper and lower  respiratory specimens during the acute phase of infection. The lowest  concentration of SARS-CoV-2 viral copies this assay can detect is 250  copies / mL. A negative result does not preclude SARS-CoV-2 infection  and should not be used as the sole basis for treatment or other  patient management decisions.  A negative result may occur with  improper specimen collection / handling, submission of specimen other  than nasopharyngeal swab, presence of viral mutation(s) within the  areas targeted by this assay, and inadequate number of viral copies  (<250 copies / mL). A negative result must be combined with clinical  observations, patient history, and epidemiological information. If result is POSITIVE SARS-CoV-2 target nucleic acids are DETECTED. The SARS-CoV-2 RNA is generally detectable in upper and lower  respiratory specimens dur ing the acute phase of infection.  Positive  results are indicative of active infection with SARS-CoV-2.  Clinical  correlation with patient history and other diagnostic information is  necessary to determine patient infection status.  Positive results do  not rule out bacterial infection or co-infection with other viruses. If result is PRESUMPTIVE POSTIVE SARS-CoV-2 nucleic acids MAY BE PRESENT.   A presumptive positive result was obtained on the submitted specimen  and confirmed on repeat testing.  While 2019 novel  coronavirus  (SARS-CoV-2) nucleic acids may be present in the submitted sample  additional confirmatory testing may be necessary for epidemiological  and / or clinical management purposes  to differentiate between  SARS-CoV-2 and other Sarbecovirus currently known to infect humans.  If clinically indicated additional testing with an alternate test  methodology [phone]) is advised. The SARS-CoV-2 RNA is generally  detectable in upper and lower respiratory sp ecimens during the acute  phase of infection. The expected result is Negative. Fact Sheet for Patients:  StrictlyIdeas.no Fact Sheet for Healthcare Providers: BankingDealers.co.za This test is not yet approved or  cleared by the [place] and has been authorized for detection and/or diagnosis of SARS-CoV-2 by FDA under an Emergency Use Authorization (EUA).  This EUA will remain in effect (meaning this test can be used) for the duration of the COVID-19 declaration under Section 564(b)(1) of the Act, 21 U.S.C. section 360bbb-3(b)(1), unless the authorization is terminated or revoked sooner. Performed at [facility], [address], [place],  [address]   Surgical PCR screen     Status: None   Collection Time: 07/23/19  1:31 PM   Specimen: Nasal Mucosa; Nasal Swab  Result Value Ref Range Status   MRSA, PCR NEGATIVE NEGATIVE Final   Staphylococcus aureus NEGATIVE NEGATIVE Final    Comment: (NOTE) The Xpert SA Assay (FDA approved for NASAL specimens in patients 82 years of age and older), is one component of a comprehensive surveillance program. It is not intended to diagnose infection nor to guide or monitor treatment. Performed at [facility], [place] [address] [place],  [address]     Radiology Reports Dg Chest 2 View  Result Date: 07/18/2019 CLINICAL DATA:  Short of breath EXAM: CHEST - 2 VIEW COMPARISON:  06/30/2019 FINDINGS: Postop CABG. Interval  development of diffuse bilateral airspace disease with the appearance of edema. Interval development of small pleural effusion. Mild atelectasis in the bases. Severe lumbar compression fracture of L1 appears chronic and unchanged from prior studies. IMPRESSION: Diffuse bilateral airspace disease with small effusion consistent with congestive heart failure and edema. Electronically Signed   By: Franchot Gallo M.D.   On: 07/18/2019 11:15   Dg Chest 2 View  Result Date: 06/30/2019 CLINICAL DATA:  Chest pain. EXAM: CHEST - 2 VIEW COMPARISON:  Radiographs of June 28, 2019. FINDINGS: Stable cardiomegaly. Status post coronary bypass graft. Atherosclerosis of thoracic aorta is noted. No pneumothorax is noted. Left lung is clear. Minimal right basilar subsegmental atelectasis is noted with minimal right pleural effusion. The visualized skeletal structures are unremarkable. IMPRESSION: Minimal right basilar subsegmental atelectasis with minimal right pleural effusion. Aortic Atherosclerosis (ICD10-I70.0). Electronically Signed   By: Marijo Conception M.D.   On: 06/30/2019 13:48   Dg Chest 2 View  Result Date: 06/28/2019 CLINICAL DATA:  Weakness.  Left lower rib pain.  Fall last week. EXAM: CHEST - 2 VIEW COMPARISON:  Chest x-ray dated January 20, 2019. FINDINGS: Stable cardiomediastinal silhouette status post CABG. Atherosclerotic calcification of the aortic arch. Normal pulmonary vascularity. No focal consolidation, pleural effusion, or pneumothorax. No acute osseous abnormality. Chronic severe L1 compression deformity. IMPRESSION: No active cardiopulmonary disease. Electronically Signed   By: Titus Dubin M.D.   On: 06/28/2019 17:30   Ct Head Wo Contrast  Result Date: 06/29/2019 CLINICAL DATA:  85 year old female with multiple falls. EXAM: CT HEAD WITHOUT CONTRAST TECHNIQUE: Contiguous axial images were obtained from the base of the skull through the vertex without intravenous contrast. COMPARISON:  Head CT  dated 01/31/2019 FINDINGS: Brain: There is moderate age-related atrophy and chronic microvascular ischemic changes. 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 There is no acute intracranial hemorrhage. No mass effect or midline shift. No extra-axial fluid collection. Vascular: No hyperdense vessel or unexpected calcification. Skull: Normal. Negative for fracture or focal lesion. Sinuses/Orbits: Diffuse mucoperiosteal thickening of paranasal sinuses. No air-fluid level. The mastoid air cells are clear. Other: None IMPRESSION: 1. No acute intracranial hemorrhage. 2. Age-related atrophy and chronic microvascular ischemic changes. 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 Electronically Signed  By: Anner Crete M.D.   On: 06/29/2019 01:24   Nm Myocar Multi W/spect W/wall Motion / Ef  Result Date: 06/30/2019  There was no ST segment deviation noted during stress.  Defect 1: There is a small defect of mild severity.  This is a low risk study.  Nuclear stress EF: 57%.  No significant reversible ischemia. LVEF 57% with normal wall motion. This is a low risk study.   Dg Hips Bilat W Or Wo Pelvis 2 Views  Result Date: 06/28/2019 CLINICAL DATA:  Fall 1 week ago with persistent left-sided hip pain, initial encounter EXAM: DG HIP (WITH OR WITHOUT PELVIS) 4V BILAT COMPARISON:  10/26/2016 FINDINGS: The pelvic ring is intact. Diffuse vascular calcifications are seen. No acute fracture or dislocation is noted. No soft tissue abnormality is seen. IMPRESSION: No acute abnormality noted. Electronically Signed   By: Inez Catalina M.D.   On: 06/28/2019 18:03     CBC Recent Labs  Lab 07/18/19 1004 07/19/19 0434 07/23/19 0348 07/24/19 0513  WBC 6.4 5.1 4.8 4.2  HGB 9.2* 9.1* 9.9* 9.0*  HCT 30.2* 29.6* 31.5* 28.5*  PLT 217 200 227 190  MCV 95.0 96.1 93.8 93.8  MCH 28.9 29.5 29.5 29.6  MCHC 30.5 30.7 31.4 31.6  RDW 14.3 14.1 13.7 13.5  LYMPHSABS 0.6*   --   --   --   MONOABS 0.3  --   --   --   EOSABS 0.1  --   --   --   BASOSABS 0.1  --   --   --     Chemistries  Recent Labs  Lab 07/18/19 1004 07/19/19 0434 07/20/19 0812 07/23/19 0348 07/24/19 0513  NA 137 138 137 138 137  K 5.2* 4.8 4.5 4.9 4.5  CL 103 103 101 101 102  CO2 24 28 27 27 26   GLUCOSE 171* 132* 84 66* 112*  BUN 37* 38* 42* 49* 55*  CREATININE 1.75* 1.64* 1.63* 2.01* 1.97*  CALCIUM 9.3 9.3 9.6 9.4 9.1  MG  --   --  2.4  --   --   AST 17  --   --   --   --   ALT 17  --   --   --   --   ALKPHOS 41  --   --   --   --   BILITOT 0.7  --   --   --   --    Lab Results  Component Value Date   HGBA1C 8.5 (H) 06/29/2019       Component Value Date/Time   BNP 283.0 (H) 07/18/2019 1004   [place] DO on 07/24/2019 at 8:09 AM  Go to [email or web address] - for contact info  [facility] - Office  [phone]

## 2019-07-24 NOTE — Care Management Important Message (Signed)
Important Message  Patient Details  Name: [name] MRN: [id number] Date of Birth: February 15, 1931   Medicare Important Message Given:  Yes     Shelda Altes 07/24/2019, 1:53 PM

## 2019-07-24 NOTE — Interval H&P Note (Signed)
History and Physical Interval Note:  07/24/2019 9:59 AM  [name]  has presented today for surgery, with the diagnosis of tachybrady syndrome.  The various methods of treatment have been discussed with the patient and family. After consideration of risks, benefits and other options for treatment, the patient has consented to  Procedure(s): PACEMAKER IMPLANT (N/A) as a surgical intervention.  The patient's history has been reviewed, patient examined, no change in status, stable for surgery.  I have reviewed the patient's chart and labs.  Questions were answered to the patient's satisfaction.     Cristopher Peru

## 2019-07-24 NOTE — Progress Notes (Signed)
Physical Therapy Treatment Patient Details Name: [name] MRN: [id number] DOB: Mar 10, 1931 Today's Date: 07/24/2019    History of Present Illness 85 y.o female with an extensive PMH including CKD,CHF, Asthma presents with a chief complaint of abdominal and shortness of breath since last night.  History was mainly provided by daughter at the bedside due to patient's mental status.  She reports patient this morning appear to be short of breath, holding onto her stomach, bending over on her chair.  Patient does report she feels a left-sided not which is painful to touch along the left lower quadrant.  According to daughter patient had been constipated but had a large bowel movement yesterday after 2 doses of milk of magnesia.  Daughter reports patient was recently hospitalized and had changes in medications, she was taken off for medications, she is unsure which ones these are but knows that 1 of them was methenamine 1 g which was prescribed by Dr. Cletus Gash and states this medication was really helping patient.  Patient does report a sensation of urgency, reports after urination she does not feel like her bladder has fully empty.  Denies any fever, vomiting, dizziness, chest pain.    PT Comments    Pt s/p pacemaker placement today, but is willing to work with therapy this afternoon. Pt is limited in safe mobility by decreased ROM of L UE, causing decreased coordination and balance with mobility, in presence of decreased strength. Pt also limited by incontinence with standing causing slip and fall risk. Pt requires min A for bed mobility, modA for sit>stand and min A for pivot transfer. Per prior notes pt with increased use of UE for power up and with inability to pushup through L UE mobility is decreased. Pt will need 24 hour physical assist until pacemaker precautions are lifted if not available PT recommends SNF level rehab. PT will follow back tomorrow to see if mobility has improved.     Follow Up  Recommendations  Home health PT;Supervision/Assistance - 24 hour     Equipment Recommendations  None recommended by PT    Recommendations for Other Services       Precautions / Restrictions Precautions Precautions: Fall Restrictions Weight Bearing Restrictions: No Other Position/Activity Restrictions: pt incontinent of urine with activity    Mobility  Bed Mobility Overal bed mobility: Needs Assistance Bed Mobility: Supine to Sit     Supine to sit: Min assist     General bed mobility comments: minA for pad scoot of hips to EoB, increased difficulty in mobility due to L UE sling to protect pacemaker  Transfers Overall transfer level: Needs assistance   Transfers: Sit to/from Stand;Stand Pivot Transfers Sit to Stand: Mod assist(verbal cues) Stand pivot transfers: Min assist(verbal cues)       General transfer comment: pt with flexed forward posture and feet sliding forward requiring blocking, pt with increased difficulty coordinating movement due to inability to use L LE, requires modA to power up, pt incontinent of urine on standing and bed saturated, minA for pivoting to recliner      Balance Overall balance assessment: Needs assistance;History of Falls Sitting-balance support: Feet supported;No upper extremity supported Sitting balance-Leahy Scale: Fair     Standing balance support: Single extremity supported Standing balance-Leahy Scale: Poor Standing balance comment: forward/flexed posture, requires outside suport                            Cognition Arousal/Alertness: Awake/alert Behavior During  Therapy: WFL for tasks assessed/performed Overall Cognitive Status: Within Functional Limits for tasks assessed                                           General Comments General comments (skin integrity, edema, etc.): Pt with L arm in sling which requires adjustment in seated as not in correct location,      Pertinent Vitals/Pain  Pain Assessment: Faces Faces Pain Scale: Hurts even more Pain Location: L shoulder pacemaker site Pain Descriptors / Indicators: Aching Pain Intervention(s): Limited activity within patient's tolerance;Monitored during session;Repositioned           PT Goals (current goals can now be found in the care plan section) Acute Rehab PT Goals Patient Stated Goal: to get back home and get stronger PT Goal Formulation: With patient Time For Goal Achievement: 08/03/19 Potential to Achieve Goals: Fair Progress towards PT goals: Not progressing toward goals - comment(pt with difficulty coordinating movement due to arm in sling)    Frequency    Min 3X/week      PT Plan Current plan remains appropriate       AM-PAC PT "6 Clicks" Mobility   Outcome Measure  Help needed turning from your back to your side while in a flat bed without using bedrails?: None Help needed moving from lying on your back to sitting on the side of a flat bed without using bedrails?: None Help needed moving to and from a bed to a chair (including a wheelchair)?: A Little Help needed standing up from a chair using your arms (e.g., wheelchair or bedside chair)?: A Little Help needed to walk in hospital room?: A Little Help needed climbing 3-5 steps with a railing? : A Lot 6 Click Score: 19    End of Session Equipment Utilized During Treatment: Gait belt Activity Tolerance: Patient tolerated treatment well;Other (comment)(pt limited in mobility by increased reliance on UE support) Patient left: in chair;with chair alarm set;with call bell/phone within reach;with nursing/sitter in room(NT giving pt bath) Nurse Communication: Other (comment)(o2 sats 95-98 on room air with activity, pain in abdomen) PT Visit Diagnosis: Unsteadiness on feet (R26.81);Repeated falls (R29.6);Muscle weakness (generalized) (M62.81);Difficulty in walking, not elsewhere classified (R26.2)     Time: 8676-1950 PT Time Calculation (min)  (ACUTE ONLY): 18 min  Charges:  $Gait Training: 8-22 mins                     Kathrynne Kulinski B. Migdalia Dk PT, DPT Acute Rehabilitation Services Pager [phone] Office [phone]    [place] 07/24/2019, 4:52 PM

## 2019-07-24 NOTE — Progress Notes (Signed)
PT Cancellation Note  Patient Details Name: [name] MRN: [id number] DOB: 1931-06-11   Cancelled Treatment:    Reason Eval/Treat Not Completed: (P) Patient at procedure or test/unavailable Pt is off floor for pacemaker placement. PT will follow back as able.  Dyllen Menning B. Migdalia Dk PT, DPT Acute Rehabilitation Services Pager [phone] Office [phone]    [place] 07/24/2019, 11:04 AM

## 2019-07-25 ENCOUNTER — Inpatient Hospital Stay (HOSPITAL_COMMUNITY): Payer: Medicare Other

## 2019-07-25 LAB — CBC
HCT: 27.8 % — ABNORMAL LOW (ref 36.0–46.0)
Hemoglobin: 8.9 g/dL — ABNORMAL LOW (ref 12.0–15.0)
MCH: 29.8 pg (ref 26.0–34.0)
MCHC: 32 g/dL (ref 30.0–36.0)
MCV: 93 fL (ref 80.0–100.0)
Platelets: 181 10*3/uL (ref 150–400)
RBC: 2.99 MIL/uL — ABNORMAL LOW (ref 3.87–5.11)
RDW: 13.3 % (ref 11.5–15.5)
WBC: 5.3 10*3/uL (ref 4.0–10.5)
nRBC: 0 % (ref 0.0–0.2)

## 2019-07-25 LAB — BASIC METABOLIC PANEL
Anion gap: 9 (ref 5–15)
BUN: 54 mg/dL — ABNORMAL HIGH (ref 8–23)
CO2: 24 mmol/L (ref 22–32)
Calcium: 9.3 mg/dL (ref 8.9–10.3)
Chloride: 103 mmol/L (ref 98–111)
Creatinine, Ser: 1.68 mg/dL — ABNORMAL HIGH (ref 0.44–1.00)
GFR calc Af Amer: 32 mL/min — ABNORMAL LOW (ref >60)
GFR calc non Af Amer: 27 mL/min — ABNORMAL LOW (ref >60)
Glucose, Bld: 123 mg/dL — ABNORMAL HIGH (ref 70–99)
Potassium: 4.6 mmol/L (ref 3.5–5.1)
Sodium: 136 mmol/L (ref 135–145)

## 2019-07-25 LAB — GLUCOSE, CAPILLARY
Glucose-Capillary: 118 mg/dL — ABNORMAL HIGH (ref 70–99)
Glucose-Capillary: 171 mg/dL — ABNORMAL HIGH (ref 70–99)

## 2019-07-25 MED ORDER — METOPROLOL TARTRATE 50 MG PO TABS: 50.0000 mg | ORAL_TABLET | Freq: Two times a day (BID) | ORAL | 0 refills | Status: DC

## 2019-07-25 MED ORDER — CEFAZOLIN SODIUM-DEXTROSE 1-4 GM/50ML-% IV SOLN
1.0000 g | Freq: Once | INTRAVENOUS | Status: AC
  Administered 2019-07-25: 1 g via INTRAVENOUS
  Filled 2019-07-25: qty 50

## 2019-07-25 MED ORDER — HYDRALAZINE HCL 25 MG PO TABS: 25.0000 mg | ORAL_TABLET | Freq: Three times a day (TID) | ORAL | 0 refills | Status: DC

## 2019-07-25 MED ORDER — METOPROLOL TARTRATE 50 MG PO TABS: 50.0000 mg | ORAL_TABLET | Freq: Two times a day (BID) | ORAL | Status: DC

## 2019-07-25 MED ORDER — YOU HAVE A PACEMAKER BOOK
Freq: Once | Status: AC
  Administered 2019-07-25: 05:00:00
  Filled 2019-07-25: qty 1

## 2019-07-25 MED FILL — Cefazolin Sodium-Dextrose IV Solution 2 GM/100ML-4%: INTRAVENOUS | Qty: 100 | Status: AC

## 2019-07-25 MED FILL — Gentamicin Sulfate Inj 40 MG/ML: INTRAMUSCULAR | Qty: 80 | Status: AC

## 2019-07-25 NOTE — Discharge Instructions (Signed)
° ° °  Supplemental Discharge Instructions for  Pacemaker/Defibrillator Patients  Activity No heavy lifting or vigorous activity with your left/right arm for 6 to 8 weeks.  Do not raise your left/right arm above your head for one week.  Gradually raise your affected arm as drawn below.              07/28/2019                 07/29/2019               07/30/2019               07/31/2019 __  NO DRIVING (pt reports she does not drive)  WOUND CARE - Keep the wound area clean and dry.  Do not get this area wet for one week. No showers for one week; you may shower on 07/31/2019  . - The tape/steri-strips on your wound will fall off; do not pull them off.  No bandage is needed on the site.  DO  NOT apply any creams, oils, or ointments to the wound area. - If you notice any drainage or discharge from the wound, any swelling or bruising at the site, or you develop a fever > 101? F after you are discharged home, call the office at once.  Special Instructions - You are still able to use cellular telephones; use the ear opposite the side where you have your pacemaker/defibrillator.  Avoid carrying your cellular phone near your device. - When traveling through airports, show security personnel your identification card to avoid being screened in the metal detectors.  Ask the security personnel to use the hand wand. - Avoid arc welding equipment, MRI testing (magnetic resonance imaging), TENS units (transcutaneous nerve stimulators).  Call the office for questions about other devices. - Avoid electrical appliances that are in poor condition or are not properly grounded. - Microwave ovens are safe to be near or to operate.

## 2019-07-25 NOTE — Progress Notes (Addendum)
Progress Note  Patient Name: [name] Date of Encounter: 07/25/2019  Primary Cardiologist: Minus Breeding, MD   Subjective   No chest pain, sob, or palpitations. Hopes to go home today  Inpatient Medications    Scheduled Meds: . aspirin  81 mg Oral Q breakfast  . atorvastatin  20 mg Oral QHS  . clopidogrel  75 mg Oral Daily  . enoxaparin (LOVENOX) injection  30 mg Subcutaneous Q24H  . ferrous sulfate  324 mg Oral Daily  . furosemide  20 mg Oral q morning - 10a  . hydrALAZINE  25 mg Oral TID  . insulin aspart  0-5 Units Subcutaneous QHS  . insulin aspart  0-9 Units Subcutaneous TID WC  . insulin detemir  10 Units Subcutaneous q morning - 10a  . metoprolol tartrate  25 mg Oral BID  . pantoprazole  40 mg Oral Daily  . PARoxetine  20 mg Oral q morning - 10a  . polyethylene glycol  17 g Oral Daily  . sodium chloride flush  3 mL Intravenous Q12H   Continuous Infusions: . sodium chloride     PRN Meds: sodium chloride, acetaminophen **OR** acetaminophen, albuterol, hydrALAZINE, ondansetron **OR** ondansetron (ZOFRAN) IV, polyethylene glycol, sodium chloride flush, traZODone   Vital Signs    Vitals:   07/24/19 2310 07/24/19 2314 07/24/19 2344 07/25/19 0507  BP: (!) 150/126 (!) 141/80 (!) 156/86   Pulse:    96  Resp:      Temp:    97.7 F (36.5 C)  TempSrc:    Oral  SpO2:    96%  Weight:      Height:        Intake/Output Summary (Last 24 hours) at 07/25/2019 0718 Last data filed at 07/24/2019 0820 Gross per 24 hour  Intake 0 ml  Output 700 ml  Net -700 ml   Filed Weights   07/22/19 0409 07/23/19 0500 07/24/19 0636  Weight: 64.4 kg 65.9 kg 68 kg    Telemetry    Atrial fib with a CVR, intermittent paced beats - Personally Reviewed  ECG     AFib, RBBB- Personally Reviewed  Physical Exam   GEN: No acute distress.   Neck: No JVD Cardiac: irreg-irreg, 1-2/6 SM, rubs, or gallops.  Respiratory: CTA b/l GI: Soft, nontender, non-distended  MS: No  edema; No deformity. Neuro:  Nonfocal  Psych: Normal affect   Labs    Chemistry Recent Labs  Lab 07/18/19 1004  07/23/19 0348 07/24/19 0513 07/25/19 0345  NA 137   < > 138 137 136  K 5.2*   < > 4.9 4.5 4.6  CL 103   < > 101 102 103  CO2 24   < > 27 26 24   GLUCOSE 171*   < > 66* 112* 123*  BUN 37*   < > 49* 55* 54*  CREATININE 1.75*   < > 2.01* 1.97* 1.68*  CALCIUM 9.3   < > 9.4 9.1 9.3  PROT 6.7  --   --   --   --   ALBUMIN 3.5  --   --   --   --   AST 17  --   --   --   --   ALT 17  --   --   --   --   ALKPHOS 41  --   --   --   --   BILITOT 0.7  --   --   --   --  GFRNONAA 26*   < > 22* 23* 27*  GFRAA 30*   < > 26* 26* 32*  ANIONGAP 10   < > 10 9 9    < > = values in this interval not displayed.     Hematology Recent Labs  Lab 07/23/19 0348 07/24/19 0513 07/25/19 0345  WBC 4.8 4.2 5.3  RBC 3.36* 3.04* 2.99*  HGB 9.9* 9.0* 8.9*  HCT 31.5* 28.5* 27.8*  MCV 93.8 93.8 93.0  MCH 29.5 29.6 29.8  MCHC 31.4 31.6 32.0  RDW 13.7 13.5 13.3  PLT 227 190 181    Cardiac EnzymesNo results for input(s): TROPONINI in the last 168 hours. No results for input(s): TROPIPOC in the last 168 hours.   BNP Recent Labs  Lab 07/18/19 1004  BNP 283.0*     DDimer No results for input(s): DDIMER in the last 168 hours.   Radiology    No results found.  Cardiac Studies   Echocardiogram: 06/29/2019 IMPRESSIONS 1. The left ventricle has normal systolic function with an ejection fraction of 60-65%. The cavity size was normal. There is moderate concentric left ventricular hypertrophy. Left ventricular diastolic Doppler parameters are consistent with  pseudonormalization. Elevated mean left atrial pressure. 2. The right ventricle has normal systolic function. The cavity was normal. There is no increase in right ventricular wall thickness. Right ventricular systolic pressure is mildly elevated with an estimated pressure of 36.6 mmHg. 3. Left atrial size was severely dilated.  4. The mitral valve is degenerative. Mild thickening of the mitral valve leaflet. Mild calcification of the mitral valve leaflet. There is severe mitral annular calcification present. Mitral valve regurgitation is mild to moderate by color flow Doppler. Moderate mitral valve stenosis. 5. The aortic valve is tricuspid. Moderate thickening of the aortic valve. Moderate calcification of the aortic valve. Moderate stenosis of the aortic valve. 6. The aorta is normal in size and structure. 7. The inferior vena cava was dilated in size with >50% respiratory variability. 8. When compared to the prior study: 10/08/2018, there is very slight worsening of the aortic stenosis. Mitral valve gradients appear lower due to bradycardia.  Patient Profile     85 y.o. female admitted with uncontrolled atrial fib/h/o tachybrady  Assessment & Plan    1. Atrial fib w/RVR (new)     Rates generally with fair control 90's, no bradycardia last 24 hours     CHA2DS2Vasc is 8      She has h/o hematochezia earlier this year and known internal hemorrhoids, patient in d/w cardiology team at [facility] was not inclined to be on a/c deferred to her daughter who wanted Dr. Rosezella [place] in put prior to decision  Dr. Lovena Le has communicated with Dr. Percival Spanish, agrees with anticoagulation Calc Cr. Cl using actual weight and today's creat is 27 (using her baseline about 1.3 would be 35)  This keeps her on a 15mg  dose of xarelto   2. Symptomatic bradycardia     Required discontinuation of meds at her last admission 3. Tachy-brady       Now s/p PPM implant yesterday with Dr. Lovena Le CXR this AM with stable lead position, no ptx Device check this AM with intact function Routine post implant EP follow up is in place teaching is completed with the patient and provided in her AVS   4. UTI (ecoli)     On abx, afebrile, wbc is wnl     C/w IM   5. Acute/chronic CHF (diastolic)      Cumulative  neg -1475     Creat down some off  lasix       6. VHD     Mod AS/MS/MR      7. HTN     No changes today  8. CKD (III)     Creat 1.68 today, baseline looks towards 1.3   EP service will sign off Recommend from our standpoint Lopressor resume prior home dose of 50mg  BID  Thursday, 07/27/2019, Start xarelto 15mg  QD  Stop plavix  I have spoken to [name], discussed wound care, activity instructions, and plans to stop Plavix, start xarelto.      For questions or updates, please contact [facility] Please consult [email or web address] for contact info under Cardiology/STEMI.      Signed, Baldwin Jamaica, PA-C  07/25/2019, 7:18 AM  Patient ID: [name], female   DOB: 1931/03/20, 85 y.o.   MRN: [id number]  EP Attending  Patient seen and examined. CXR reviewed. PM interrogation under my direction demonstrates normal DDD PM function. She will be discharged home with usual follow up.   Mikle Bosworth.D.

## 2019-07-25 NOTE — Progress Notes (Signed)
Physical Therapy Treatment Patient Details Name: [name] MRN: [id number] DOB: 1931-02-23 Today's Date: 07/25/2019    History of Present Illness 85 y.o female with an extensive PMH including CKD,CHF, Asthma presents with a chief complaint of abdominal and shortness of breath since last night.  History was mainly provided by daughter at the bedside due to patient's mental status.  She reports patient this morning appear to be short of breath, holding onto her stomach, bending over on her chair.  Patient does report she feels a left-sided not which is painful to touch along the left lower quadrant.  According to daughter patient had been constipated but had a large bowel movement yesterday after 2 doses of milk of magnesia.  Daughter reports patient was recently hospitalized and had changes in medications, she was taken off for medications, she is unsure which ones these are but knows that 1 of them was methenamine 1 g which was prescribed by Dr. Cletus Gash and states this medication was really helping patient.  Patient does report a sensation of urgency, reports after urination she does not feel like her bladder has fully empty.  Denies any fever, vomiting, dizziness, chest pain.    PT Comments    Pt continues to be limited in safe mobility by incontinence with upright posture, pacemaker precautions, as well as decreased strength and endurance. Pt requires mod A for bed mobility, transfers and very short distance ambulation. Pt reports that she lives with 2 daughters who provide 24 hr assist and have been helping her with mainly transfers to wheelchair for mobility. Pt will need at least that level of assistance at discharge. Pt hopeful for discharge home today.   Follow Up Recommendations  Home health PT;Supervision/Assistance - 24 hour(pt reports living with 2 daughters who assist with transfers)     Equipment Recommendations  None recommended by PT       Precautions / Restrictions  Precautions Precautions: Fall Restrictions Weight Bearing Restrictions: No Other Position/Activity Restrictions: pt incontinent of urine with activity    Mobility  Bed Mobility Overal bed mobility: Needs Assistance Bed Mobility: Supine to Sit     Supine to sit: Min assist     General bed mobility comments: minA for pad scoot of hips to EoB, vc for not using L UE to push off for scooting EoB  Transfers Overall transfer level: Needs assistance   Transfers: Sit to/from Stand;Stand Pivot Transfers Sit to Stand: Min assist(verbal cues) Stand pivot transfers: Mod assist(verbal cues)       General transfer comment: modA for pivot to [facility], pt with incontinence with standing vc for standing all the way up to be able to clear the armrest of the BSC, pt started to sit prematurely, vc for not pushing off with L UE, min A for sit>stand from [facility]   Ambulation/Gait Ambulation/Gait assistance: Mod assist Gait Distance (Feet): 3 Feet Assistive device: 1 person hand held assist Gait Pattern/deviations: Trunk flexed;Decreased step length - right;Decreased step length - left;Shuffle Gait velocity: slowed Gait velocity interpretation: <1.8 ft/sec, indicate of risk for recurrent falls General Gait Details: unable to use RW due to pacemaker precautions, pt requires modA for steadying, after 3 feet ambulation pt with increased forward flexured and requires recliner be pulled underneath her       Balance Overall balance assessment: Needs assistance;History of Falls Sitting-balance support: Feet supported;No upper extremity supported Sitting balance-Leahy Scale: Fair     Standing balance support: Single extremity supported Standing balance-Leahy Scale: Poor Standing balance  comment: forward/flexed posture, requires outside suport                            Cognition Arousal/Alertness: Awake/alert Behavior During Therapy: WFL for tasks assessed/performed Overall Cognitive Status:  Within Functional Limits for tasks assessed                                           General Comments General comments (skin integrity, edema, etc.): Pt continues to have incontinence with all upright movement, limiting her mobility and safety       Pertinent Vitals/Pain Pain Assessment: Faces Faces Pain Scale: Hurts little more Pain Location: L shoulder pacemaker site Pain Descriptors / Indicators: Aching Pain Intervention(s): Limited activity within patient's tolerance;Monitored during session;Repositioned           PT Goals (current goals can now be found in the care plan section) Acute Rehab PT Goals Patient Stated Goal: to get back home and get stronger PT Goal Formulation: With patient Time For Goal Achievement: 08/03/19 Potential to Achieve Goals: Fair Progress towards PT goals: Progressing toward goals    Frequency    Min 3X/week      PT Plan Current plan remains appropriate       AM-PAC PT "6 Clicks" Mobility   Outcome Measure  Help needed turning from your back to your side while in a flat bed without using bedrails?: None Help needed moving from lying on your back to sitting on the side of a flat bed without using bedrails?: None Help needed moving to and from a bed to a chair (including a wheelchair)?: A Little Help needed standing up from a chair using your arms (e.g., wheelchair or bedside chair)?: A Little Help needed to walk in hospital room?: A Little Help needed climbing 3-5 steps with a railing? : A Lot 6 Click Score: 19    End of Session Equipment Utilized During Treatment: Gait belt Activity Tolerance: Patient tolerated treatment well;Other (comment)(pt limited in mobility by increased reliance on UE support) Patient left: in chair;with chair alarm set;with call bell/phone within reach Nurse Communication: Mobility status PT Visit Diagnosis: Unsteadiness on feet (R26.81);Repeated falls (R29.6);Muscle weakness (generalized)  (M62.81);Difficulty in walking, not elsewhere classified (R26.2)     Time: 6599-3570 PT Time Calculation (min) (ACUTE ONLY): 31 min  Charges:  $Gait Training: 8-22 mins $Therapeutic Activity: 8-22 mins                     Karron Alvizo B. Migdalia Dk PT, DPT Acute Rehabilitation Services Pager [phone] Office [phone]    [place] 07/25/2019, 1:42 PM

## 2019-07-25 NOTE — Discharge Summary (Signed)
Physician Discharge Summary  [name] HER:[id number] DOB: 12/04/1931 DOA: 07/18/2019  PCP: [name], MD  Admit date: 07/18/2019 Discharge date: 07/25/2019  Admitted From: Home Disposition: Home with home health  Recommendations for Outpatient Follow-up:  1. Follow up with PCP in 1-2 weeks; cardiology as scheduled 2. Please obtain BMP/CBC in one week  Home Health: Yes Equipment/Devices: None  Discharge Condition: Stable CODE STATUS: Full Diet recommendation: Cardiac prudent diabetic diet  Brief/Interim Summary: 85 y.o.femalewith PMHxrelevant forCAD s/p CABG (2006), carotic artery stenosis, HTN with hx of hypertensive urgency, CKD stage III, DM, dyslipidemia, GERD, and chronic diastolic heart failure, prior CVA withresidual slurred speech,aortic stenosis and Mitral stenosis, CKD 3, asthma, hypertension and diabetes recent history of bradycardia with junctional bradycardia readmitted on 07/18/2019 with tachy-arrhythmia vs new onset A. Fib - symptomatic with ongoing shortness of breath. [facility] to [facility] on 07[facility]19 for EP evaluation for possible tachybradycardia syndrome and possible pacemaker placement  Patient admitted as above with tachybradycardia syndrome, now status post pacemaker placed 07/24/2019.  Cardiology recommending Lopressor 50 twice daily, Xarelto 15 daily, stop Plavix. Patient also had multiple medication changes during hospital stay, discontinuation of lisinopril, decrease hydralazine from 50 to 25 TID. Patient otherwise stable from medical standpoint for discharge, post procedure x-ray no acute pneumothorax, patient tolerating p.o. well, ambulating without difficulty, no bradycardia noted overnight.  Patient will close follow-up with PCP and cardiology as scheduled to ensure ongoing close management of patient's morbid conditions as below.  Discharge Diagnoses:  Principal Problem:   New onset a-fib [facility]) Active Problems:   Type 2 diabetes mellitus with  insulin therapy ([place])   HTN (hypertension)   Anemia of chronic disease   CAD (coronary artery disease)   (HFpEF) heart failure with preserved ejection fraction [facility])  Discharge Instructions   Allergies as of 07/25/2019      Reactions   Advil [ibuprofen] Swelling   Heparin Other (See Comments)   Confusion, hallucinations.    Propoxyphene N-acetaminophen Other (See Comments)   Numbness all over. "floating" sensation.      Medication List    STOP taking these medications   amLODipine 10 MG tablet Commonly known as: NORVASC   aspirin 81 MG chewable tablet   clopidogrel 75 MG tablet Commonly known as: PLAVIX   lisinopril 20 MG tablet Commonly known as: ZESTRIL     TAKE these medications   albuterol 108 (90 Base) MCG/ACT inhaler Commonly known as: VENTOLIN HFA Inhale 2 puffs into the lungs every 6 (six) hours as needed for wheezing or shortness of breath.   atorvastatin 20 MG tablet Commonly known as: LIPITOR Take 1 tablet (20 mg total) by mouth at bedtime.   Clear Eyes for Dry Eyes 1-0.25 % Soln Generic drug: Carboxymethylcellul-Glycerin Place 1 drop into both eyes daily as needed.   DAILY VITAMINS/IRON/BETA CAROT PO Take 1 tablet by mouth at bedtime.   esomeprazole 40 MG capsule Commonly known as: NEXIUM TAKE 1 CAPSULE DAILY.   ferrous sulfate 324 (65 Fe) MG Tbec Take 324 mg by mouth daily.   furosemide 20 MG tablet Commonly known as: LASIX Take 1 tablet (20 mg total) by mouth every morning.   hydrALAZINE 25 MG tablet Commonly known as: APRESOLINE Take 1 tablet (25 mg total) by mouth 3 (three) times daily. What changed:   medication strength  how much to take   insulin lispro 100 UNIT/ML KiwkPen Commonly known as: HumaLOG KwikPen Inject 0.04-0.1 mLs (4-10 Units total) into the skin daily as needed (  high blood sugar). What changed:   how much to take  reasons to take this   Levemir FlexTouch 100 UNIT/ML Pen Generic drug: Insulin Detemir INJECT  24 UNITS EACH MORNING What changed: See the new instructions.   magnesium hydroxide 400 MG/5ML suspension Commonly known as: MILK OF MAGNESIA Take 30 mLs by mouth daily as needed for mild constipation.   metFORMIN 500 MG 24 hr tablet Commonly known as: GLUCOPHAGE-XR TAKE 1 TABLET 2 TIMES A DAY WITH BREAKFAST & DINNER. What changed: See the new instructions.   metoprolol tartrate 50 MG tablet Commonly known as: LOPRESSOR Take 1 tablet (50 mg total) by mouth 2 (two) times daily.   nitroGLYCERIN 0.4 MG SL tablet Commonly known as: NITROSTAT PLACE (1) TABLET UNDER TONGUE EVERY 5 MINUTES UP TO (3) DOSES. IF NO RELIEF CALL 911.   PARoxetine 20 MG tablet Commonly known as: PAXIL TAKE 1 TABLET BY MOUTH EVERY MORNING.   polyethylene glycol 17 g packet Commonly known as: MiraLax Take 17 g by mouth daily. What changed:   when to take this  reasons to take this      [facility] Follow up.   Specialty: Cardiology Why: 08/10/2019 @ 4:00PM, wound check visit Contact information: [address], [address] [place]       Evans Lance, MD Follow up.   Specialty: Cardiology Why: 11/07/2019 @ 8:30AM Contact information: [place] [address] [phone]        Minus Breeding, MD Follow up.   Specialty: Cardiology Why: 08/30/2019 @ 10:00AM Contact information: [place] [address] [phone]          Allergies  Allergen Reactions  . Advil [Ibuprofen] Swelling  . Heparin Other (See Comments)    Confusion, hallucinations.   . Propoxyphene N-Acetaminophen Other (See Comments)    Numbness all over. "floating" sensation.    Consultations:  Cardiology (EP)   Procedures/Studies: Dg Chest 2 View  Result Date: 07/25/2019 CLINICAL DATA:  Follow-up congestive heart failure. Status post pacemaker placement. EXAM: CHEST - 2 VIEW COMPARISON:   07/18/2019 FINDINGS: New dual lead transvenous pacemaker seen with leads overlying the right atrium and right ventricle. Prior CABG again noted. Aortic atherosclerosis. Stable mild cardiomegaly. There has been interval improvement in diffuse interstitial infiltrates, consistent with decreased pulmonary edema. No evidence of focal consolidation. Small bilateral pleural effusions again noted. IMPRESSION: 1. New transvenous pacemaker in appropriate position. No evidence of pneumothorax. 2. Decreased pulmonary interstitial edema. 3. Persistent small bilateral pleural effusions. Electronically Signed   By: Marlaine Hind M.D.   On: 07/25/2019 08:12   Dg Chest 2 View  Result Date: 07/18/2019 CLINICAL DATA:  Short of breath EXAM: CHEST - 2 VIEW COMPARISON:  06/30/2019 FINDINGS: Postop CABG. Interval development of diffuse bilateral airspace disease with the appearance of edema. Interval development of small pleural effusion. Mild atelectasis in the bases. Severe lumbar compression fracture of L1 appears chronic and unchanged from prior studies. IMPRESSION: Diffuse bilateral airspace disease with small effusion consistent with congestive heart failure and edema. Electronically Signed   By: Franchot Gallo M.D.   On: 07/18/2019 11:15   Dg Chest 2 View  Result Date: 06/30/2019 CLINICAL DATA:  Chest pain. EXAM: CHEST - 2 VIEW COMPARISON:  Radiographs of June 28, 2019. FINDINGS: Stable cardiomegaly. Status post coronary bypass graft. Atherosclerosis of thoracic aorta is noted. No pneumothorax is noted. Left lung  is clear. Minimal right basilar subsegmental atelectasis is noted with minimal right pleural effusion. The visualized skeletal structures are unremarkable. IMPRESSION: Minimal right basilar subsegmental atelectasis with minimal right pleural effusion. Aortic Atherosclerosis (ICD10-I70.0). Electronically Signed   By: Marijo Conception M.D.   On: 06/30/2019 13:48   Dg Chest 2 View  Result Date: 06/28/2019 CLINICAL  DATA:  Weakness.  Left lower rib pain.  Fall last week. EXAM: CHEST - 2 VIEW COMPARISON:  Chest x-ray dated January 20, 2019. FINDINGS: Stable cardiomediastinal silhouette status post CABG. Atherosclerotic calcification of the aortic arch. Normal pulmonary vascularity. No focal consolidation, pleural effusion, or pneumothorax. No acute osseous abnormality. Chronic severe L1 compression deformity. IMPRESSION: No active cardiopulmonary disease. Electronically Signed   By: Titus Dubin M.D.   On: 06/28/2019 17:30   Ct Head Wo Contrast  Result Date: 06/29/2019 CLINICAL DATA:  85 year old female with multiple falls. EXAM: CT HEAD WITHOUT CONTRAST TECHNIQUE: Contiguous axial images were obtained from the base of the skull through the vertex without intravenous contrast. COMPARISON:  Head CT dated 01/31/2019 FINDINGS: Brain: There is moderate age-related atrophy and chronic microvascular ischemic changes. 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 There is no acute intracranial hemorrhage. No mass effect or midline shift. No extra-axial fluid collection. Vascular: No hyperdense vessel or unexpected calcification. Skull: Normal. Negative for fracture or focal lesion. Sinuses/Orbits: Diffuse mucoperiosteal thickening of paranasal sinuses. No air-fluid level. The mastoid air cells are clear. Other: None IMPRESSION: 1. No acute intracranial hemorrhage. 2. Age-related atrophy and chronic microvascular ischemic changes. 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 Electronically Signed   By: Anner Crete M.D.   On: 06/29/2019 01:24   Nm Myocar Multi W/spect W/wall Motion / Ef  Result Date: 06/30/2019  There was no ST segment deviation noted during stress.  Defect 1: There is a small defect of mild severity.  This is a low risk study.  Nuclear stress EF: 57%.  No significant reversible ischemia. LVEF 57% with normal wall motion. This is a low risk study.    Dg Hips Bilat W Or Wo Pelvis 2 Views  Result Date: 06/28/2019 CLINICAL DATA:  Fall 1 week ago with persistent left-sided hip pain, initial encounter EXAM: DG HIP (WITH OR WITHOUT PELVIS) 4V BILAT COMPARISON:  10/26/2016 FINDINGS: The pelvic ring is intact. Diffuse vascular calcifications are seen. No acute fracture or dislocation is noted. No soft tissue abnormality is seen. IMPRESSION: No acute abnormality noted. Electronically Signed   By: Inez Catalina M.D.   On: 06/28/2019 18:03    Subjective: No acute issues or events overnight. Denies chest pain, shortness of breath, nausea, vomiting, diarrhea, constipation, headache, fevers, or chills.   Discharge Exam: Vitals:   07/25/19 0507 07/25/19 0942  BP:  (!) 149/86  Pulse: 96   Resp:    Temp: 97.7 F (36.5 C)   SpO2: 96%    Vitals:   07/24/19 2314 07/24/19 2344 07/25/19 0507 07/25/19 0942  BP: (!) 141/80 (!) 156/86  (!) 149/86  Pulse:   96   Resp:      Temp:   97.7 F (36.5 C)   TempSrc:   Oral   SpO2:   96%   Weight:   69.6 kg   Height:        General:  Pleasantly resting in bed, No acute distress. HEENT:  Normocephalic atraumatic.  Sclerae nonicteric, noninjected.  Extraocular movements intact bilaterally. Neck:  Without mass or deformity.  Trachea  is midline. Lungs:  Clear to auscultate bilaterally without rhonchi, wheeze, or rales. Heart/Chest: Left upper chest pacer site bandage clean dry intact. Regular rate and rhythm.  Without murmurs, rubs, or gallops. Abdomen:  Soft, nontender, nondistended.  Without guarding or rebound. Extremities: Without cyanosis, clubbing, edema, or obvious deformity. Vascular:  Dorsalis pedis and posterior tibial pulses palpable bilaterally. Skin:  Warm and dry, no erythema, no ulcerations.   The results of significant diagnostics from this hospitalization (including imaging, microbiology, ancillary and laboratory) are listed below for reference.    Microbiology: Recent Results (from the  past 240 hour(s))  SARS Coronavirus 2 [place] order, Performed in [facility] hospital lab) Nasopharyngeal Nasopharyngeal Swab     Status: None   Collection Time: 07/18/19 12:26 PM   Specimen: Nasopharyngeal Swab  Result Value Ref Range Status   SARS Coronavirus 2 NEGATIVE NEGATIVE Final    Comment: (NOTE) If result is NEGATIVE SARS-CoV-2 target nucleic acids are NOT DETECTED. The SARS-CoV-2 RNA is generally detectable in upper and lower  respiratory specimens during the acute phase of infection. The lowest  concentration of SARS-CoV-2 viral copies this assay can detect is 250  copies / mL. A negative result does not preclude SARS-CoV-2 infection  and should not be used as the sole basis for treatment or other  patient management decisions.  A negative result may occur with  improper specimen collection / handling, submission of specimen other  than nasopharyngeal swab, presence of viral mutation(s) within the  areas targeted by this assay, and inadequate number of viral copies  (<250 copies / mL). A negative result must be combined with clinical  observations, patient history, and epidemiological information. If result is POSITIVE SARS-CoV-2 target nucleic acids are DETECTED. The SARS-CoV-2 RNA is generally detectable in upper and lower  respiratory specimens dur ing the acute phase of infection.  Positive  results are indicative of active infection with SARS-CoV-2.  Clinical  correlation with patient history and other diagnostic information is  necessary to determine patient infection status.  Positive results do  not rule out bacterial infection or co-infection with other viruses. If result is PRESUMPTIVE POSTIVE SARS-CoV-2 nucleic acids MAY BE PRESENT.   A presumptive positive result was obtained on the submitted specimen  and confirmed on repeat testing.  While 2019 novel coronavirus  (SARS-CoV-2) nucleic acids may be present in the submitted sample  additional confirmatory  testing may be necessary for epidemiological  and / or clinical management purposes  to differentiate between  SARS-CoV-2 and other Sarbecovirus currently known to infect humans.  If clinically indicated additional testing with an alternate test  methodology [phone]) is advised. The SARS-CoV-2 RNA is generally  detectable in upper and lower respiratory sp ecimens during the acute  phase of infection. The expected result is Negative. Fact Sheet for Patients:  StrictlyIdeas.no Fact Sheet for Healthcare Providers: BankingDealers.co.za This test is not yet approved or cleared by the [place] FDA and has been authorized for detection and/or diagnosis of SARS-CoV-2 by FDA under an Emergency Use Authorization (EUA).  This EUA will remain in effect (meaning this test can be used) for the duration of the COVID-19 declaration under Section 564(b)(1) of the Act, 21 U.S.C. section 360bbb-3(b)(1), unless the authorization is terminated or revoked sooner. Performed at [facility], [address], [place], [place] [address]   Surgical PCR screen     Status: None   Collection Time: 07/23/19  1:31 PM   Specimen: Nasal Mucosa; Nasal Swab  Result  Value Ref Range Status   MRSA, PCR NEGATIVE NEGATIVE Final   Staphylococcus aureus NEGATIVE NEGATIVE Final    Comment: (NOTE) The Xpert SA Assay (FDA approved for NASAL specimens in patients 23 years of age and older), is one component of a comprehensive surveillance program. It is not intended to diagnose infection nor to guide or monitor treatment. Performed at [facility], [place] [address], [place], [place] [address]      Labs: BNP (last 3 results) Recent Labs    01/03/19 1713 06/28/19 1651 07/18/19 1004  BNP 184.8* 487.0* 338.2*   Basic Metabolic Panel: Recent Labs  Lab 07/19/19 0434 07/20/19 0812 07/23/19 0348 07/24/19 0513 07/25/19 0345  NA 138 137 138 137 136  K 4.8 4.5 4.9  4.5 4.6  CL 103 101 101 102 103  CO2 28 27 27 26 24   GLUCOSE 132* 84 66* 112* 123*  BUN 38* 42* 49* 55* 54*  CREATININE 1.64* 1.63* 2.01* 1.97* 1.68*  CALCIUM 9.3 9.6 9.4 9.1 9.3  MG  --  2.4  --   --   --    CBC: Recent Labs  Lab 07/19/19 0434 07/23/19 0348 07/24/19 0513 07/25/19 0345  WBC 5.1 4.8 4.2 5.3  HGB 9.1* 9.9* 9.0* 8.9*  HCT 29.6* 31.5* 28.5* 27.8*  MCV 96.1 93.8 93.8 93.0  PLT 200 227 190 181    CBG: Recent Labs  Lab 07/24/19 0758 07/24/19 1220 07/24/19 1715 07/24/19 2134 07/25/19 0741  GLUCAP 111* 135* 222* 191* 118*    Urinalysis    Component Value Date/Time   COLORURINE YELLOW 07/18/2019 1020   APPEARANCEUR HAZY (A) 07/18/2019 1020   APPEARANCEUR Clear 09/27/2017 1056   LABSPEC 1.013 07/18/2019 1020   PHURINE 5.0 07/18/2019 1020   GLUCOSEU NEGATIVE 07/18/2019 1020   HGBUR NEGATIVE 07/18/2019 1020   BILIRUBINUR NEGATIVE 07/18/2019 1020   BILIRUBINUR Negative 09/27/2017 1056   KETONESUR NEGATIVE 07/18/2019 1020   PROTEINUR 30 (A) 07/18/2019 1020   UROBILINOGEN negative 02/03/2016 1516   UROBILINOGEN 0.2 10/17/2015 1915   NITRITE POSITIVE (A) 07/18/2019 1020   LEUKOCYTESUR TRACE (A) 07/18/2019 1020    Microbiology Recent Results (from the past 240 hour(s))  SARS Coronavirus 2 [facility] order, Performed in [facility] hospital lab) Nasopharyngeal Nasopharyngeal Swab     Status: None   Collection Time: 07/18/19 12:26 PM   Specimen: Nasopharyngeal Swab  Result Value Ref Range Status   SARS Coronavirus 2 NEGATIVE NEGATIVE Final    Comment: (NOTE) If result is NEGATIVE SARS-CoV-2 target nucleic acids are NOT DETECTED. The SARS-CoV-2 RNA is generally detectable in upper and lower  respiratory specimens during the acute phase of infection. The lowest  concentration of SARS-CoV-2 viral copies this assay can detect is 250  copies / mL. A negative result does not preclude SARS-CoV-2 infection  and should not be used as the sole basis for treatment  or other  patient management decisions.  A negative result may occur with  improper specimen collection / handling, submission of specimen other  than nasopharyngeal swab, presence of viral mutation(s) within the  areas targeted by this assay, and inadequate number of viral copies  (<250 copies / mL). A negative result must be combined with clinical  observations, patient history, and epidemiological information. If result is POSITIVE SARS-CoV-2 target nucleic acids are DETECTED. The SARS-CoV-2 RNA is generally detectable in upper and lower  respiratory specimens dur ing the acute phase of infection.  Positive  results are indicative of active  infection with SARS-CoV-2.  Clinical  correlation with patient history and other diagnostic information is  necessary to determine patient infection status.  Positive results do  not rule out bacterial infection or co-infection with other viruses. If result is PRESUMPTIVE POSTIVE SARS-CoV-2 nucleic acids MAY BE PRESENT.   A presumptive positive result was obtained on the submitted specimen  and confirmed on repeat testing.  While 2019 novel coronavirus  (SARS-CoV-2) nucleic acids may be present in the submitted sample  additional confirmatory testing may be necessary for epidemiological  and / or clinical management purposes  to differentiate between  SARS-CoV-2 and other Sarbecovirus currently known to infect humans.  If clinically indicated additional testing with an alternate test  methodology [phone]) is advised. The SARS-CoV-2 RNA is generally  detectable in upper and lower respiratory sp ecimens during the acute  phase of infection. The expected result is Negative. Fact Sheet for Patients:  StrictlyIdeas.no Fact Sheet for Healthcare Providers: BankingDealers.co.za This test is not yet approved or cleared by the [place] FDA and has been authorized for detection and/or diagnosis of  SARS-CoV-2 by FDA under an Emergency Use Authorization (EUA).  This EUA will remain in effect (meaning this test can be used) for the duration of the COVID-19 declaration under Section 564(b)(1) of the Act, 21 U.S.C. section 360bbb-3(b)(1), unless the authorization is terminated or revoked sooner. Performed at [facility], [address], [place], [place] [address]   Surgical PCR screen     Status: None   Collection Time: 07/23/19  1:31 PM   Specimen: Nasal Mucosa; Nasal Swab  Result Value Ref Range Status   MRSA, PCR NEGATIVE NEGATIVE Final   Staphylococcus aureus NEGATIVE NEGATIVE Final    Comment: (NOTE) The Xpert SA Assay (FDA approved for NASAL specimens in patients 5 years of age and older), is one component of a comprehensive surveillance program. It is not intended to diagnose infection nor to guide or monitor treatment. Performed at [facility], [place] [address], [place], [place] [address]      Time coordinating discharge: Over 30 minutes  SIGNED:  Little Ishikawa, DO [facility] 07/25/2019, 11:03 AM Pager   If 7PM-7AM, please contact night-coverage [email or web address] Password [email or web address]

## 2019-07-31 DIAGNOSIS — I083 Combined rheumatic disorders of mitral, aortic and tricuspid valves: Secondary | ICD-10-CM | POA: Diagnosis not present

## 2019-07-31 DIAGNOSIS — I6529 Occlusion and stenosis of unspecified carotid artery: Secondary | ICD-10-CM | POA: Diagnosis not present

## 2019-07-31 DIAGNOSIS — N183 Chronic kidney disease, stage 3 (moderate): Secondary | ICD-10-CM | POA: Diagnosis not present

## 2019-07-31 DIAGNOSIS — I2510 Atherosclerotic heart disease of native coronary artery without angina pectoris: Secondary | ICD-10-CM | POA: Diagnosis not present

## 2019-07-31 DIAGNOSIS — M479 Spondylosis, unspecified: Secondary | ICD-10-CM | POA: Diagnosis not present

## 2019-07-31 DIAGNOSIS — D631 Anemia in chronic kidney disease: Secondary | ICD-10-CM | POA: Diagnosis not present

## 2019-07-31 DIAGNOSIS — I5032 Chronic diastolic (congestive) heart failure: Secondary | ICD-10-CM | POA: Diagnosis not present

## 2019-07-31 DIAGNOSIS — E785 Hyperlipidemia, unspecified: Secondary | ICD-10-CM | POA: Diagnosis not present

## 2019-07-31 DIAGNOSIS — M1712 Unilateral primary osteoarthritis, left knee: Secondary | ICD-10-CM | POA: Diagnosis not present

## 2019-07-31 DIAGNOSIS — Z8673 Personal history of transient ischemic attack (TIA), and cerebral infarction without residual deficits: Secondary | ICD-10-CM | POA: Diagnosis not present

## 2019-07-31 DIAGNOSIS — M419 Scoliosis, unspecified: Secondary | ICD-10-CM | POA: Diagnosis not present

## 2019-07-31 DIAGNOSIS — M160 Bilateral primary osteoarthritis of hip: Secondary | ICD-10-CM | POA: Diagnosis not present

## 2019-07-31 DIAGNOSIS — I252 Old myocardial infarction: Secondary | ICD-10-CM | POA: Diagnosis not present

## 2019-07-31 DIAGNOSIS — K219 Gastro-esophageal reflux disease without esophagitis: Secondary | ICD-10-CM | POA: Diagnosis not present

## 2019-07-31 DIAGNOSIS — Z794 Long term (current) use of insulin: Secondary | ICD-10-CM | POA: Diagnosis not present

## 2019-07-31 DIAGNOSIS — K635 Polyp of colon: Secondary | ICD-10-CM | POA: Diagnosis not present

## 2019-07-31 DIAGNOSIS — E1122 Type 2 diabetes mellitus with diabetic chronic kidney disease: Secondary | ICD-10-CM | POA: Diagnosis not present

## 2019-07-31 DIAGNOSIS — Z9181 History of falling: Secondary | ICD-10-CM | POA: Diagnosis not present

## 2019-07-31 DIAGNOSIS — M8580 Other specified disorders of bone density and structure, unspecified site: Secondary | ICD-10-CM | POA: Diagnosis not present

## 2019-07-31 DIAGNOSIS — I130 Hypertensive heart and chronic kidney disease with heart failure and stage 1 through stage 4 chronic kidney disease, or unspecified chronic kidney disease: Secondary | ICD-10-CM | POA: Diagnosis not present

## 2019-08-02 ENCOUNTER — Telehealth: Payer: Self-pay

## 2019-08-02 DIAGNOSIS — M160 Bilateral primary osteoarthritis of hip: Secondary | ICD-10-CM | POA: Diagnosis not present

## 2019-08-02 DIAGNOSIS — M1712 Unilateral primary osteoarthritis, left knee: Secondary | ICD-10-CM | POA: Diagnosis not present

## 2019-08-02 DIAGNOSIS — M419 Scoliosis, unspecified: Secondary | ICD-10-CM | POA: Diagnosis not present

## 2019-08-02 DIAGNOSIS — M8580 Other specified disorders of bone density and structure, unspecified site: Secondary | ICD-10-CM | POA: Diagnosis not present

## 2019-08-02 DIAGNOSIS — Z8673 Personal history of transient ischemic attack (TIA), and cerebral infarction without residual deficits: Secondary | ICD-10-CM | POA: Diagnosis not present

## 2019-08-02 DIAGNOSIS — I5032 Chronic diastolic (congestive) heart failure: Secondary | ICD-10-CM | POA: Diagnosis not present

## 2019-08-02 DIAGNOSIS — K219 Gastro-esophageal reflux disease without esophagitis: Secondary | ICD-10-CM | POA: Diagnosis not present

## 2019-08-02 DIAGNOSIS — N183 Chronic kidney disease, stage 3 (moderate): Secondary | ICD-10-CM | POA: Diagnosis not present

## 2019-08-02 DIAGNOSIS — D631 Anemia in chronic kidney disease: Secondary | ICD-10-CM | POA: Diagnosis not present

## 2019-08-02 DIAGNOSIS — I252 Old myocardial infarction: Secondary | ICD-10-CM | POA: Diagnosis not present

## 2019-08-02 DIAGNOSIS — Z794 Long term (current) use of insulin: Secondary | ICD-10-CM | POA: Diagnosis not present

## 2019-08-02 DIAGNOSIS — K635 Polyp of colon: Secondary | ICD-10-CM | POA: Diagnosis not present

## 2019-08-02 DIAGNOSIS — E785 Hyperlipidemia, unspecified: Secondary | ICD-10-CM | POA: Diagnosis not present

## 2019-08-02 DIAGNOSIS — M479 Spondylosis, unspecified: Secondary | ICD-10-CM | POA: Diagnosis not present

## 2019-08-02 DIAGNOSIS — I2510 Atherosclerotic heart disease of native coronary artery without angina pectoris: Secondary | ICD-10-CM | POA: Diagnosis not present

## 2019-08-02 DIAGNOSIS — Z9181 History of falling: Secondary | ICD-10-CM | POA: Diagnosis not present

## 2019-08-02 DIAGNOSIS — E1122 Type 2 diabetes mellitus with diabetic chronic kidney disease: Secondary | ICD-10-CM | POA: Diagnosis not present

## 2019-08-02 DIAGNOSIS — I130 Hypertensive heart and chronic kidney disease with heart failure and stage 1 through stage 4 chronic kidney disease, or unspecified chronic kidney disease: Secondary | ICD-10-CM | POA: Diagnosis not present

## 2019-08-02 DIAGNOSIS — I083 Combined rheumatic disorders of mitral, aortic and tricuspid valves: Secondary | ICD-10-CM | POA: Diagnosis not present

## 2019-08-02 DIAGNOSIS — I6529 Occlusion and stenosis of unspecified carotid artery: Secondary | ICD-10-CM | POA: Diagnosis not present

## 2019-08-02 NOTE — Telephone Encounter (Signed)
Alert received for AF episode, AF in progress. Burden is 100%. No OAC noted. Spoke to pt daughter, daughter states the pt is "doing fine." States pt has always been weak and SOB. Daughter requests to accompany pt at wound check appt on 08/10/19, states the pt has dementia and will need a wheelchair to come upstairs to appt. Will route to Dr. Lovena Le.

## 2019-08-02 NOTE — Telephone Encounter (Signed)
She will need to be started on a blood thinner. Lets discuss when I am back in the office. GT

## 2019-08-03 ENCOUNTER — Telehealth: Payer: Self-pay

## 2019-08-03 DIAGNOSIS — K635 Polyp of colon: Secondary | ICD-10-CM | POA: Diagnosis not present

## 2019-08-03 DIAGNOSIS — Z9181 History of falling: Secondary | ICD-10-CM | POA: Diagnosis not present

## 2019-08-03 DIAGNOSIS — I252 Old myocardial infarction: Secondary | ICD-10-CM | POA: Diagnosis not present

## 2019-08-03 DIAGNOSIS — E785 Hyperlipidemia, unspecified: Secondary | ICD-10-CM | POA: Diagnosis not present

## 2019-08-03 DIAGNOSIS — M160 Bilateral primary osteoarthritis of hip: Secondary | ICD-10-CM | POA: Diagnosis not present

## 2019-08-03 DIAGNOSIS — Z8673 Personal history of transient ischemic attack (TIA), and cerebral infarction without residual deficits: Secondary | ICD-10-CM | POA: Diagnosis not present

## 2019-08-03 DIAGNOSIS — M8580 Other specified disorders of bone density and structure, unspecified site: Secondary | ICD-10-CM | POA: Diagnosis not present

## 2019-08-03 DIAGNOSIS — M1712 Unilateral primary osteoarthritis, left knee: Secondary | ICD-10-CM | POA: Diagnosis not present

## 2019-08-03 DIAGNOSIS — N183 Chronic kidney disease, stage 3 (moderate): Secondary | ICD-10-CM | POA: Diagnosis not present

## 2019-08-03 DIAGNOSIS — I130 Hypertensive heart and chronic kidney disease with heart failure and stage 1 through stage 4 chronic kidney disease, or unspecified chronic kidney disease: Secondary | ICD-10-CM | POA: Diagnosis not present

## 2019-08-03 DIAGNOSIS — M479 Spondylosis, unspecified: Secondary | ICD-10-CM | POA: Diagnosis not present

## 2019-08-03 DIAGNOSIS — I2510 Atherosclerotic heart disease of native coronary artery without angina pectoris: Secondary | ICD-10-CM | POA: Diagnosis not present

## 2019-08-03 DIAGNOSIS — I083 Combined rheumatic disorders of mitral, aortic and tricuspid valves: Secondary | ICD-10-CM | POA: Diagnosis not present

## 2019-08-03 DIAGNOSIS — E1122 Type 2 diabetes mellitus with diabetic chronic kidney disease: Secondary | ICD-10-CM | POA: Diagnosis not present

## 2019-08-03 DIAGNOSIS — I6529 Occlusion and stenosis of unspecified carotid artery: Secondary | ICD-10-CM | POA: Diagnosis not present

## 2019-08-03 DIAGNOSIS — I5032 Chronic diastolic (congestive) heart failure: Secondary | ICD-10-CM | POA: Diagnosis not present

## 2019-08-03 DIAGNOSIS — Z794 Long term (current) use of insulin: Secondary | ICD-10-CM | POA: Diagnosis not present

## 2019-08-03 DIAGNOSIS — M419 Scoliosis, unspecified: Secondary | ICD-10-CM | POA: Diagnosis not present

## 2019-08-03 DIAGNOSIS — D631 Anemia in chronic kidney disease: Secondary | ICD-10-CM | POA: Diagnosis not present

## 2019-08-03 DIAGNOSIS — K219 Gastro-esophageal reflux disease without esophagitis: Secondary | ICD-10-CM | POA: Diagnosis not present

## 2019-08-03 NOTE — Telephone Encounter (Signed)
Per review of Pt chart and Dr. Janus Molder wants Dr. Rosezella [place] opinion on anticoagulation.

## 2019-08-03 NOTE — Telephone Encounter (Signed)
error 

## 2019-08-04 NOTE — Telephone Encounter (Signed)
I would agree with anticoagulation per Dr. Lovena Le

## 2019-08-04 NOTE — Telephone Encounter (Signed)
Left message to call back  

## 2019-08-06 ENCOUNTER — Encounter (HOSPITAL_COMMUNITY): Payer: Self-pay | Admitting: Emergency Medicine

## 2019-08-06 ENCOUNTER — Other Ambulatory Visit: Payer: Self-pay | Admitting: Family Medicine

## 2019-08-06 ENCOUNTER — Emergency Department (HOSPITAL_COMMUNITY)
Admission: EM | Admit: 2019-08-06 | Discharge: 2019-08-06 | Disposition: A | Discharge status: Home / Self Care | Payer: Medicare Other | Attending: Emergency Medicine | Admitting: Emergency Medicine

## 2019-08-06 ENCOUNTER — Other Ambulatory Visit: Payer: Self-pay

## 2019-08-06 DIAGNOSIS — E1165 Type 2 diabetes mellitus with hyperglycemia: Secondary | ICD-10-CM | POA: Diagnosis not present

## 2019-08-06 DIAGNOSIS — Z794 Long term (current) use of insulin: Secondary | ICD-10-CM | POA: Insufficient documentation

## 2019-08-06 DIAGNOSIS — Z950 Presence of cardiac pacemaker: Secondary | ICD-10-CM | POA: Diagnosis not present

## 2019-08-06 DIAGNOSIS — F0390 Unspecified dementia without behavioral disturbance: Secondary | ICD-10-CM | POA: Diagnosis not present

## 2019-08-06 DIAGNOSIS — K219 Gastro-esophageal reflux disease without esophagitis: Secondary | ICD-10-CM

## 2019-08-06 DIAGNOSIS — Z79899 Other long term (current) drug therapy: Secondary | ICD-10-CM | POA: Diagnosis not present

## 2019-08-06 DIAGNOSIS — R42 Dizziness and giddiness: Secondary | ICD-10-CM | POA: Diagnosis not present

## 2019-08-06 DIAGNOSIS — I2510 Atherosclerotic heart disease of native coronary artery without angina pectoris: Secondary | ICD-10-CM | POA: Diagnosis not present

## 2019-08-06 DIAGNOSIS — N183 Chronic kidney disease, stage 3 (moderate): Secondary | ICD-10-CM | POA: Insufficient documentation

## 2019-08-06 DIAGNOSIS — Z96651 Presence of right artificial knee joint: Secondary | ICD-10-CM | POA: Insufficient documentation

## 2019-08-06 DIAGNOSIS — I130 Hypertensive heart and chronic kidney disease with heart failure and stage 1 through stage 4 chronic kidney disease, or unspecified chronic kidney disease: Secondary | ICD-10-CM | POA: Insufficient documentation

## 2019-08-06 DIAGNOSIS — Z8673 Personal history of transient ischemic attack (TIA), and cerebral infarction without residual deficits: Secondary | ICD-10-CM | POA: Insufficient documentation

## 2019-08-06 DIAGNOSIS — R739 Hyperglycemia, unspecified: Secondary | ICD-10-CM

## 2019-08-06 DIAGNOSIS — E1122 Type 2 diabetes mellitus with diabetic chronic kidney disease: Secondary | ICD-10-CM | POA: Insufficient documentation

## 2019-08-06 DIAGNOSIS — I5032 Chronic diastolic (congestive) heart failure: Secondary | ICD-10-CM | POA: Insufficient documentation

## 2019-08-06 LAB — COMPREHENSIVE METABOLIC PANEL
ALT: 45 U/L — ABNORMAL HIGH (ref 0–44)
AST: 33 U/L (ref 15–41)
Albumin: 4 g/dL (ref 3.5–5.0)
Alkaline Phosphatase: 48 U/L (ref 38–126)
Anion gap: 8 (ref 5–15)
BUN: 48 mg/dL — ABNORMAL HIGH (ref 8–23)
CO2: 23 mmol/L (ref 22–32)
Calcium: 9.6 mg/dL (ref 8.9–10.3)
Chloride: 107 mmol/L (ref 98–111)
Creatinine, Ser: 1.72 mg/dL — ABNORMAL HIGH (ref 0.44–1.00)
GFR calc Af Amer: 31 mL/min — ABNORMAL LOW (ref >60)
GFR calc non Af Amer: 27 mL/min — ABNORMAL LOW (ref >60)
Glucose, Bld: 65 mg/dL — ABNORMAL LOW (ref 70–99)
Potassium: 3.8 mmol/L (ref 3.5–5.1)
Sodium: 138 mmol/L (ref 135–145)
Total Bilirubin: 0.4 mg/dL (ref 0.3–1.2)
Total Protein: 7 g/dL (ref 6.5–8.1)

## 2019-08-06 LAB — CBC WITH DIFFERENTIAL/PLATELET
Abs Immature Granulocytes: 0.03 10*3/uL (ref 0.00–0.07)
Basophils Absolute: 0.1 10*3/uL (ref 0.0–0.1)
Basophils Relative: 1 %
Eosinophils Absolute: 0.2 10*3/uL (ref 0.0–0.5)
Eosinophils Relative: 2 %
HCT: 33.6 % — ABNORMAL LOW (ref 36.0–46.0)
Hemoglobin: 10.1 g/dL — ABNORMAL LOW (ref 12.0–15.0)
Immature Granulocytes: 0 %
Lymphocytes Relative: 7 %
Lymphs Abs: 0.6 10*3/uL — ABNORMAL LOW (ref 0.7–4.0)
MCH: 29.2 pg (ref 26.0–34.0)
MCHC: 30.1 g/dL (ref 30.0–36.0)
MCV: 97.1 fL (ref 80.0–100.0)
Monocytes Absolute: 0.4 10*3/uL (ref 0.1–1.0)
Monocytes Relative: 4 %
Neutro Abs: 7.7 10*3/uL (ref 1.7–7.7)
Neutrophils Relative %: 86 %
Platelets: 261 10*3/uL (ref 150–400)
RBC: 3.46 MIL/uL — ABNORMAL LOW (ref 3.87–5.11)
RDW: 14.4 % (ref 11.5–15.5)
WBC: 8.9 10*3/uL (ref 4.0–10.5)
nRBC: 0 % (ref 0.0–0.2)

## 2019-08-06 LAB — CBG MONITORING, ED
Glucose-Capillary: 57 mg/dL — ABNORMAL LOW (ref 70–99)
Glucose-Capillary: 50 mg/dL — ABNORMAL LOW (ref 70–99)
Glucose-Capillary: 74 mg/dL (ref 70–99)

## 2019-08-06 LAB — LIPASE, BLOOD: Lipase: 21 U/L (ref 11–51)

## 2019-08-06 MED ORDER — SODIUM CHLORIDE 0.9 % IV BOLUS
500.0000 mL | Freq: Once | INTRAVENOUS | Status: AC
  Administered 2019-08-06: 19:00:00 500 mL via INTRAVENOUS

## 2019-08-06 NOTE — Discharge Instructions (Signed)
Your sugar has come down to normal and actually a little bit below normal.  Eat a snack before bedtime.  Recommend getting a new glucose machine.  Follow-up with your primary care doctor.

## 2019-08-06 NOTE — ED Triage Notes (Signed)
Pt reports having intermittent dizziness for 3 days. Speech seems slurred, but daughter states this is usual for her.

## 2019-08-07 DIAGNOSIS — E119 Type 2 diabetes mellitus without complications: Secondary | ICD-10-CM | POA: Diagnosis not present

## 2019-08-08 DIAGNOSIS — I2510 Atherosclerotic heart disease of native coronary artery without angina pectoris: Secondary | ICD-10-CM | POA: Diagnosis not present

## 2019-08-08 DIAGNOSIS — E1122 Type 2 diabetes mellitus with diabetic chronic kidney disease: Secondary | ICD-10-CM | POA: Diagnosis not present

## 2019-08-08 DIAGNOSIS — Z794 Long term (current) use of insulin: Secondary | ICD-10-CM | POA: Diagnosis not present

## 2019-08-08 DIAGNOSIS — D631 Anemia in chronic kidney disease: Secondary | ICD-10-CM | POA: Diagnosis not present

## 2019-08-08 DIAGNOSIS — M79676 Pain in unspecified toe(s): Secondary | ICD-10-CM | POA: Diagnosis not present

## 2019-08-08 DIAGNOSIS — M1712 Unilateral primary osteoarthritis, left knee: Secondary | ICD-10-CM | POA: Diagnosis not present

## 2019-08-08 DIAGNOSIS — L84 Corns and callosities: Secondary | ICD-10-CM | POA: Diagnosis not present

## 2019-08-08 DIAGNOSIS — B351 Tinea unguium: Secondary | ICD-10-CM | POA: Diagnosis not present

## 2019-08-08 DIAGNOSIS — Z9181 History of falling: Secondary | ICD-10-CM | POA: Diagnosis not present

## 2019-08-08 DIAGNOSIS — E1142 Type 2 diabetes mellitus with diabetic polyneuropathy: Secondary | ICD-10-CM | POA: Diagnosis not present

## 2019-08-08 DIAGNOSIS — I5032 Chronic diastolic (congestive) heart failure: Secondary | ICD-10-CM | POA: Diagnosis not present

## 2019-08-08 DIAGNOSIS — I083 Combined rheumatic disorders of mitral, aortic and tricuspid valves: Secondary | ICD-10-CM | POA: Diagnosis not present

## 2019-08-08 DIAGNOSIS — K635 Polyp of colon: Secondary | ICD-10-CM | POA: Diagnosis not present

## 2019-08-08 DIAGNOSIS — K219 Gastro-esophageal reflux disease without esophagitis: Secondary | ICD-10-CM | POA: Diagnosis not present

## 2019-08-08 DIAGNOSIS — M419 Scoliosis, unspecified: Secondary | ICD-10-CM | POA: Diagnosis not present

## 2019-08-08 DIAGNOSIS — E785 Hyperlipidemia, unspecified: Secondary | ICD-10-CM | POA: Diagnosis not present

## 2019-08-08 DIAGNOSIS — I130 Hypertensive heart and chronic kidney disease with heart failure and stage 1 through stage 4 chronic kidney disease, or unspecified chronic kidney disease: Secondary | ICD-10-CM | POA: Diagnosis not present

## 2019-08-08 DIAGNOSIS — Z8673 Personal history of transient ischemic attack (TIA), and cerebral infarction without residual deficits: Secondary | ICD-10-CM | POA: Diagnosis not present

## 2019-08-08 DIAGNOSIS — I6529 Occlusion and stenosis of unspecified carotid artery: Secondary | ICD-10-CM | POA: Diagnosis not present

## 2019-08-08 DIAGNOSIS — M479 Spondylosis, unspecified: Secondary | ICD-10-CM | POA: Diagnosis not present

## 2019-08-08 DIAGNOSIS — N183 Chronic kidney disease, stage 3 (moderate): Secondary | ICD-10-CM | POA: Diagnosis not present

## 2019-08-08 DIAGNOSIS — M8580 Other specified disorders of bone density and structure, unspecified site: Secondary | ICD-10-CM | POA: Diagnosis not present

## 2019-08-08 DIAGNOSIS — M160 Bilateral primary osteoarthritis of hip: Secondary | ICD-10-CM | POA: Diagnosis not present

## 2019-08-08 DIAGNOSIS — I252 Old myocardial infarction: Secondary | ICD-10-CM | POA: Diagnosis not present

## 2019-08-09 ENCOUNTER — Telehealth: Payer: Self-pay

## 2019-08-09 NOTE — ED Provider Notes (Signed)
[facility] EMERGENCY DEPARTMENT Provider Note   CSN: [id number] Arrival date & time: 08/06/19  1831     History   Chief Complaint Chief Complaint  Patient presents with  . Dizziness    HPI [name] is a 85 y.o. female.     Level 5 caveat for dementia.  Patient has been having "dizziness" and nausea for 3 days.  She is status post pacemaker placement on 07/24/2019 by Dr. Crissie Sickles for a tachybradycardia syndrome.  She was ambulatory in the home.  Daughter reports normal behavior.  No stiff neck, obvious new neurological deficits, fever, chills, dysuria, cough, chest pain.     Past Medical History:  Diagnosis Date  . Anemia   . Anxiety   . Aortic stenosis   . Arthritis    back, knees, and hips  . Asthma    with allergies  . Balance problems   . CAD (coronary artery disease)   . Carotid stenosis   . CHF (congestive heart failure) ([place])    EF preserved Echo 2012  . CKD (chronic kidney disease)   . 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 Dementia ([place])    "a little"  . Depression   . Diabetes mellitus    type 2  . GERD (gastroesophageal reflux disease)   . Hypertension   . Iron deficiency anemia 05/27/2011  . Myocardial infarction ([place])   . Renal insufficiency   . Scoliosis   . Vertigo    "when sugar gets low"  . Vision loss    left eye-"half blind"    Patient Active Problem List   Diagnosis Date Noted  . New onset a-fib ([place]) 07/18/2019  . Nocturnal enuresis 07/03/2019  . Mixed stress and urge urinary incontinence 07/03/2019  . Generalized weakness 06/28/2019  . Symptomatic bradycardia 06/28/2019  . Apnea 02/15/2019  . Snoring 02/15/2019  . Polyp of colon   . Accelerated hypertension 01/20/2019  . Rectal bleeding 11/08/2018  . Rectal pain 11/08/2018  . Abnormal CT scan, colon 11/08/2018  . (HFpEF) heart failure with preserved ejection fraction ([place])  10/09/2018  . Acute on chronic diastolic CHF (congestive heart failure) ([place]) 10/09/2018  . Aortic stenosis, moderate 10/09/2018  . Mitral valve stenosis 10/09/2018  . Acute exacerbation of CHF (congestive heart failure) ([place]) 10/07/2018  . Acute hypoxemic respiratory failure ([place]) 10/07/2018  . Chronic anemia 10/07/2018  . Asthma 10/07/2018  . CVA (cerebral vascular accident) ([place]) 10/07/2018  . CAD (coronary artery disease) 10/07/2018  . Constipation 10/07/2018  . Physical deconditioning 10/07/2018  . History of recurrent UTIs 10/07/2018  . CKD (chronic kidney disease) stage 3, GFR 30-59 ml/min (HCC) 12/22/2017  . Dyslipidemia 12/22/2017  . Weakness 11/23/2017  . Overweight (BMI 25.0-29.9) 05/08/2016  . UTI (lower urinary tract infection) 11/12/2015  . DM hyperosmolarity type II, uncontrolled ([place]) 11/12/2015  . Dementia ([place])   . Encephalopathy, metabolic 47/08/6282  . S/P right TKA 08/13/2015  . S/P knee replacement 08/13/2015  . Elevated troponin 03/06/2015  . Fever   . History of diabetes mellitus   . Left buttock pain   . Mixed hyperlipidemia 01/11/2015  . GAD (generalized anxiety disorder) 01/11/2015  . Depression 01/11/2015  . GERD (gastroesophageal reflux disease) 01/11/2015  . Osteopenia 07/13/2014  . Obesity (BMI 30-39.9) 06/19/2014  . Carotid stenosis   . Anemia of chronic disease 12/25/2011  . Iron deficiency anemia 05/27/2011  .  Diastolic CHF, chronic ([place]) 04/02/2010  . Type 2 diabetes mellitus with insulin therapy ([place]) 10/08/2009  . HLD (hyperlipidemia) 10/08/2009  . HTN (hypertension) 10/08/2009  . Coronary atherosclerosis 10/08/2009  . CAROTID STENOSIS 10/08/2009    Past Surgical History:  Procedure Laterality Date  . APPENDECTOMY     with hysterectomy  . CATARACT EXTRACTION Bilateral 5 years ago  . CHOLECYSTECTOMY    . COLONOSCOPY  2005   Dr. Amedeo Plenty: Diverticulosis  . COLONOSCOPY WITH PROPOFOL N/A 02/14/2019   Procedure: COLONOSCOPY WITH  PROPOFOL;  Surgeon: Danie Binder, MD;  [facility];  Service: Endoscopy;  Laterality: N/A;  . CORONARY ANGIOPLASTY     prior to 2006 5 stents  . CORONARY ARTERY BYPASS GRAFT  2006  . I & D of [place]  April 2013  . KNEE SURGERY Right   . PACEMAKER IMPLANT N/A 07/24/2019   Procedure: PACEMAKER IMPLANT;  Surgeon: Evans Lance, MD;  Location: [place] CV LAB;  Service: Cardiovascular;  Laterality: N/A;  . POLYPECTOMY  02/14/2019   Procedure: POLYPECTOMY;  Surgeon: Danie Binder, MD;  Location: [facility] ENDO SUITE;  Service: Endoscopy;;  cecal polyp  . TOTAL ABDOMINAL HYSTERECTOMY  ~85 years old   complete, with tumor removal  . TOTAL KNEE ARTHROPLASTY Right 08/13/2015   Procedure: RIGHT  TOTAL KNEE ARTHROPLASTY;  Surgeon: Paralee Cancel, MD;  Location: [facility] ORS;  Service: Orthopedics;  Laterality: Right;     OB History    Gravida  4   Para  4   Term  4   Preterm      AB      Living  3     SAB      TAB      Ectopic      Multiple      Live Births               Home Medications    Prior to Admission medications   Medication Sig Start Date End Date Taking? Authorizing Provider  albuterol (PROVENTIL HFA;VENTOLIN HFA) 108 (90 Base) MCG/ACT inhaler Inhale 2 puffs into the lungs every 6 (six) hours as needed for wheezing or shortness of breath. 01/20/19   [name], MD  atorvastatin (LIPITOR) 20 MG tablet Take 1 tablet (20 mg total) by mouth at bedtime. 10/18/18   [name], MD  Carboxymethylcellul-Glycerin (CLEAR EYES FOR DRY EYES) 1-0.25 % SOLN Place 1 drop into both eyes daily as needed.    [provider]  esomeprazole (NEXIUM) 40 MG capsule Take 1 capsule (40 mg total) by mouth daily. (Needs to be seen before next refill) 08/07/19   [name], MD  ferrous sulfate 324 (65 Fe) MG TBEC Take 324 mg by mouth daily.     [provider]  furosemide (LASIX) 20 MG tablet Take 1 tablet (20 mg total) by mouth every morning. 07/02/19    Domenic Polite, MD  hydrALAZINE (APRESOLINE) 25 MG tablet Take 1 tablet (25 mg total) by mouth 3 (three) times daily. 07/25/19   Little Ishikawa, MD  insulin lispro (HUMALOG KWIKPEN) 100 UNIT/ML KiwkPen Inject 0.04-0.1 mLs (4-10 Units total) into the skin daily as needed (high blood sugar). Patient taking differently: Inject 5-15 Units into the skin daily as needed (for high blood sugar levels over 250).  07/12/18   [name], MD  LEVEMIR FLEXTOUCH 100 UNIT/ML Pen INJECT [place] MORNING Patient taking differently: Inject 24 Units into the skin every morning.  06/19/19  [name], MD  magnesium hydroxide (MILK OF MAGNESIA) 400 MG/5ML suspension Take 30 mLs by mouth daily as needed for mild constipation.    [provider]  metFORMIN (GLUCOPHAGE-XR) 500 MG 24 hr tablet TAKE 1 TABLET 2 TIMES A DAY WITH BREAKFAST & DINNER. Patient taking differently: Take 500 mg by mouth 2 (two) times daily.  07/06/19   [name], MD  methenamine (HIPREX) 1 g tablet  07/20/19   [provider]  metoprolol tartrate (LOPRESSOR) 50 MG tablet Take 1 tablet (50 mg total) by mouth 2 (two) times daily. 07/25/19   Little Ishikawa, MD  Multiple Vitamins-Iron (DAILY VITAMINS/IRON/BETA CAROT PO) Take 1 tablet by mouth at bedtime.    [provider]  nitroGLYCERIN (NITROSTAT) 0.4 MG SL tablet PLACE (1) TABLET UNDER TONGUE EVERY 5 MINUTES UP TO (3) DOSES. IF NO RELIEF CALL 911. 07/06/19   [name], MD  PARoxetine (PAXIL) 20 MG tablet TAKE 1 TABLET BY MOUTH EVERY MORNING. 07/06/19   [name], MD  polyethylene glycol [facility]) packet Take 17 g by mouth daily. Patient taking differently: Take 17 g by mouth daily as needed for mild constipation or moderate constipation.  07/25/18   Maudie Flakes, MD    Family History Family History  Problem Relation Age of Onset  . Cancer Brother        porstate  . Early death Sister   . Heart disease Brother   . Heart disease  Brother   . Heart attack Brother   . Heart disease Brother   . Heart attack Brother   . Diabetes Sister   . Heart attack Sister   . Diabetes Sister   . Osteoporosis Sister   . Hypertension Sister   . Heart attack Son   . Early death Son   . Pancreatitis Son   . Heart attack Daughter 22    Social History Social History   Tobacco Use  . Smoking status: Never Smoker  . Smokeless tobacco: Never Used  Substance Use Topics  . Alcohol use: No  . Drug use: No     Allergies   Advil [ibuprofen], Heparin, and Propoxyphene n-acetaminophen   Review of Systems Review of Systems  Unable to perform ROS: Dementia     Physical Exam Updated Vital Signs BP (!) 143/107   Pulse (!) 111   Temp (!) 97.5 F (36.4 C) (Oral)   Resp 20   Ht 5\' 6"  (1.676 m)   Wt 66.7 kg   SpO2 97%   BMI 23.73 kg/m   Physical Exam Vitals signs and nursing note reviewed.  Constitutional:      Appearance: She is well-developed.     Comments: Pleasant, demented  HENT:     Head: Normocephalic and atraumatic.  Eyes:     Conjunctiva/sclera: Conjunctivae normal.  Neck:     Musculoskeletal: Neck supple.  Cardiovascular:     Rate and Rhythm: Normal rate and regular rhythm.  Pulmonary:     Effort: Pulmonary effort is normal.     Breath sounds: Normal breath sounds.  Abdominal:     General: Bowel sounds are normal.     Palpations: Abdomen is soft.  Musculoskeletal: Normal range of motion.  Skin:    General: Skin is warm and dry.  Neurological:     Mental Status: She is alert.     Comments: Moves all 4 extremities.  Psychiatric:        Behavior: Behavior normal.  ED Treatments / Results  Labs (all labs ordered are listed, but only abnormal results are displayed) Labs Reviewed  CBC WITH DIFFERENTIAL/PLATELET - Abnormal; Notable for the following components:      Result Value   RBC 3.46 (*)    Hemoglobin 10.1 (*)    HCT 33.6 (*)    Lymphs Abs 0.6 (*)    All other components within  normal limits  COMPREHENSIVE METABOLIC PANEL - Abnormal; Notable for the following components:   Glucose, Bld 65 (*)    BUN 48 (*)    Creatinine, Ser 1.72 (*)    ALT 45 (*)    GFR calc non Af Amer 27 (*)    GFR calc Af Amer 31 (*)    All other components within normal limits  CBG MONITORING, ED - Abnormal; Notable for the following components:   Glucose-Capillary 57 (*)    All other components within normal limits  CBG MONITORING, ED - Abnormal; Notable for the following components:   Glucose-Capillary 50 (*)    All other components within normal limits  LIPASE, BLOOD  CBG MONITORING, ED    EKG EKG Interpretation  Date/Time:  Sunday August 06 2019 18:55:03 EDT Ventricular Rate:  109 PR Interval:    QRS Duration: 149 QT Interval:  397 QTC Calculation: 535 R Axis:   76 Text Interpretation:  Atrial fibrillation Right bundle branch block Confirmed by Nat Christen [phone]) on 08/06/2019 10:03:12 PM   Radiology No results found.  Procedures Procedures (including critical care time)  Medications Ordered in ED Medications  sodium chloride 0.9 % bolus 500 mL (0 mLs Intravenous Stopped 08/06/19 2030)     Initial Impression / Assessment and Plan / ED Course  I have reviewed the triage vital signs and the nursing notes.  Pertinent labs & imaging results that were available during my care of the patient were reviewed by me and considered in my medical decision making (see chart for details).        Patient glucose was suboptimal.  I suspect this is the etiology of her symptoms.  Daughter reports she is at baseline.  Glucose replacement in the ED.  Patient does not meet inpatient criteria.  Final Clinical Impressions(s) / ED Diagnoses   Final diagnoses:  Dizziness  Hyperglycemia    ED Discharge Orders    None       Nat Christen, MD 08/09/19 1436

## 2019-08-09 NOTE — Telephone Encounter (Signed)
    COVID-19 Pre-Screening Questions:  . In the past 7 to 10 days have you had a cough,  shortness of breath, headache, congestion, fever (100 or greater) body aches, chills, sore throat, or sudden loss of taste or sense of smell? No . Have you been around anyone with known Covid 19. No . Have you been around anyone who is awaiting Covid 19 test results in the past 7 to 10 days? No . Have you been around anyone who has been exposed to Covid 19, or has mentioned symptoms of Covid 19 within the past 7 to 10 days? No  If you have any concerns/questions about symptoms patients report during screening (either on the phone or at threshold). Contact the provider seeing the patient or DOD for further guidance.  If neither are available contact a member of the leadership team.         The pt daughter answered no to all covid-19 prescreening questions. I told the pt daughter to make sure both her and the pt have a mask on. She asked can she come with the pt because the pt is in a wheelchair and have dementia. I told her she can come with the pt and make sure she wear a mask. The pt daughter verbalized understanding.

## 2019-08-10 ENCOUNTER — Ambulatory Visit (INDEPENDENT_AMBULATORY_CARE_PROVIDER_SITE_OTHER): Payer: Medicare Other | Admitting: *Deleted

## 2019-08-10 ENCOUNTER — Other Ambulatory Visit: Payer: Self-pay

## 2019-08-10 DIAGNOSIS — Z9181 History of falling: Secondary | ICD-10-CM | POA: Diagnosis not present

## 2019-08-10 DIAGNOSIS — R001 Bradycardia, unspecified: Secondary | ICD-10-CM

## 2019-08-10 DIAGNOSIS — Z794 Long term (current) use of insulin: Secondary | ICD-10-CM | POA: Diagnosis not present

## 2019-08-10 DIAGNOSIS — I130 Hypertensive heart and chronic kidney disease with heart failure and stage 1 through stage 4 chronic kidney disease, or unspecified chronic kidney disease: Secondary | ICD-10-CM | POA: Diagnosis not present

## 2019-08-10 DIAGNOSIS — K635 Polyp of colon: Secondary | ICD-10-CM | POA: Diagnosis not present

## 2019-08-10 DIAGNOSIS — M8580 Other specified disorders of bone density and structure, unspecified site: Secondary | ICD-10-CM | POA: Diagnosis not present

## 2019-08-10 DIAGNOSIS — I2510 Atherosclerotic heart disease of native coronary artery without angina pectoris: Secondary | ICD-10-CM | POA: Diagnosis not present

## 2019-08-10 DIAGNOSIS — M1712 Unilateral primary osteoarthritis, left knee: Secondary | ICD-10-CM | POA: Diagnosis not present

## 2019-08-10 DIAGNOSIS — M479 Spondylosis, unspecified: Secondary | ICD-10-CM | POA: Diagnosis not present

## 2019-08-10 DIAGNOSIS — E785 Hyperlipidemia, unspecified: Secondary | ICD-10-CM | POA: Diagnosis not present

## 2019-08-10 DIAGNOSIS — K219 Gastro-esophageal reflux disease without esophagitis: Secondary | ICD-10-CM | POA: Diagnosis not present

## 2019-08-10 DIAGNOSIS — M160 Bilateral primary osteoarthritis of hip: Secondary | ICD-10-CM | POA: Diagnosis not present

## 2019-08-10 DIAGNOSIS — N183 Chronic kidney disease, stage 3 (moderate): Secondary | ICD-10-CM | POA: Diagnosis not present

## 2019-08-10 DIAGNOSIS — I5032 Chronic diastolic (congestive) heart failure: Secondary | ICD-10-CM | POA: Diagnosis not present

## 2019-08-10 DIAGNOSIS — I083 Combined rheumatic disorders of mitral, aortic and tricuspid valves: Secondary | ICD-10-CM | POA: Diagnosis not present

## 2019-08-10 DIAGNOSIS — I252 Old myocardial infarction: Secondary | ICD-10-CM | POA: Diagnosis not present

## 2019-08-10 DIAGNOSIS — Z8673 Personal history of transient ischemic attack (TIA), and cerebral infarction without residual deficits: Secondary | ICD-10-CM | POA: Diagnosis not present

## 2019-08-10 DIAGNOSIS — E1122 Type 2 diabetes mellitus with diabetic chronic kidney disease: Secondary | ICD-10-CM | POA: Diagnosis not present

## 2019-08-10 DIAGNOSIS — M419 Scoliosis, unspecified: Secondary | ICD-10-CM | POA: Diagnosis not present

## 2019-08-10 DIAGNOSIS — D631 Anemia in chronic kidney disease: Secondary | ICD-10-CM | POA: Diagnosis not present

## 2019-08-10 DIAGNOSIS — I6529 Occlusion and stenosis of unspecified carotid artery: Secondary | ICD-10-CM | POA: Diagnosis not present

## 2019-08-10 LAB — CUP PACEART INCLINIC DEVICE CHECK
Battery Remaining Longevity: 182 mo
Battery Voltage: 3.21 V
Brady Statistic AP VP Percent: 0 %
Brady Statistic AP VS Percent: 0 %
Brady Statistic AS VP Percent: 0 %
Brady Statistic AS VS Percent: 0 %
Brady Statistic RA Percent Paced: 0.12 %
Brady Statistic RV Percent Paced: 3.43 %
Date Time Interrogation Session: 20200903175210
Implantable Lead Implant Date: 20200817
Implantable Lead Implant Date: 20200817
Implantable Lead Location: 753859
Implantable Lead Location: 753860
Implantable Lead Model: 5076
Implantable Lead Model: 5076
Implantable Pulse Generator Implant Date: 20200817
Lead Channel Impedance Value: 266 Ohm
Lead Channel Impedance Value: 342 Ohm
Lead Channel Impedance Value: 437 Ohm
Lead Channel Impedance Value: 494 Ohm
Lead Channel Pacing Threshold Amplitude: 0.5 V
Lead Channel Pacing Threshold Pulse Width: 0.4 ms
Lead Channel Sensing Intrinsic Amplitude: 1.125 mV
Lead Channel Sensing Intrinsic Amplitude: 13.25 mV
Lead Channel Setting Pacing Amplitude: 3.5 V
Lead Channel Setting Pacing Amplitude: 3.5 V
Lead Channel Setting Pacing Pulse Width: 0.4 ms
Lead Channel Setting Sensing Sensitivity: 1.2 mV

## 2019-08-10 NOTE — Patient Instructions (Signed)
Call if patient has increased swelling at incision site. Ensure all doses of medications are taken.Call office if patient has increased shortness of breath, edema, or chest pain. Call 911 and seek treatment in ED if condition worsens after office hours.

## 2019-08-10 NOTE — Progress Notes (Signed)
Wound check appointment. Steri-strips removed. Wound without redness. Diffuse edema at site of pacemaker with brusing present. No OAS. Incision edges approximated, wound well healed. Normal device function. Thresholds, sensing, and impedances consistent with implant measurements. Device programmed at 3.5V/auto capture programmed on for extra safety margin until 3 month visit. Histogram distribution appropriate for patient and level of activity. 1 AT/AF episode that is ongoing. AT/AF burden 100%.  No high ventricular rates noted in alerts. Presenting AF/VS 70-130's today, family reports inconsistent metoprolol  doses. Dr Lovena Le made aware of patient presentation and inconsistent med regimen per family, aware no OAS, hx of rectal bleeding.  Patient educated about wound care, arm mobility, lifting restrictions and consistent medication administration for rate control.. Remote monitor education completed , next remote 11/07/2019. F/u with Dr Lovena Le 11/07/2019. F/U in DC 08/15/19 for wound check due to edema.

## 2019-08-11 DIAGNOSIS — I252 Old myocardial infarction: Secondary | ICD-10-CM | POA: Diagnosis not present

## 2019-08-11 DIAGNOSIS — I5032 Chronic diastolic (congestive) heart failure: Secondary | ICD-10-CM | POA: Diagnosis not present

## 2019-08-11 DIAGNOSIS — D631 Anemia in chronic kidney disease: Secondary | ICD-10-CM | POA: Diagnosis not present

## 2019-08-11 DIAGNOSIS — I6529 Occlusion and stenosis of unspecified carotid artery: Secondary | ICD-10-CM | POA: Diagnosis not present

## 2019-08-11 DIAGNOSIS — E1122 Type 2 diabetes mellitus with diabetic chronic kidney disease: Secondary | ICD-10-CM | POA: Diagnosis not present

## 2019-08-11 DIAGNOSIS — M419 Scoliosis, unspecified: Secondary | ICD-10-CM | POA: Diagnosis not present

## 2019-08-11 DIAGNOSIS — I083 Combined rheumatic disorders of mitral, aortic and tricuspid valves: Secondary | ICD-10-CM | POA: Diagnosis not present

## 2019-08-11 DIAGNOSIS — M479 Spondylosis, unspecified: Secondary | ICD-10-CM | POA: Diagnosis not present

## 2019-08-11 DIAGNOSIS — M1712 Unilateral primary osteoarthritis, left knee: Secondary | ICD-10-CM | POA: Diagnosis not present

## 2019-08-11 DIAGNOSIS — K635 Polyp of colon: Secondary | ICD-10-CM | POA: Diagnosis not present

## 2019-08-11 DIAGNOSIS — Z794 Long term (current) use of insulin: Secondary | ICD-10-CM | POA: Diagnosis not present

## 2019-08-11 DIAGNOSIS — M8580 Other specified disorders of bone density and structure, unspecified site: Secondary | ICD-10-CM | POA: Diagnosis not present

## 2019-08-11 DIAGNOSIS — E785 Hyperlipidemia, unspecified: Secondary | ICD-10-CM | POA: Diagnosis not present

## 2019-08-11 DIAGNOSIS — Z8673 Personal history of transient ischemic attack (TIA), and cerebral infarction without residual deficits: Secondary | ICD-10-CM | POA: Diagnosis not present

## 2019-08-11 DIAGNOSIS — I130 Hypertensive heart and chronic kidney disease with heart failure and stage 1 through stage 4 chronic kidney disease, or unspecified chronic kidney disease: Secondary | ICD-10-CM | POA: Diagnosis not present

## 2019-08-11 DIAGNOSIS — Z9181 History of falling: Secondary | ICD-10-CM | POA: Diagnosis not present

## 2019-08-11 DIAGNOSIS — I2510 Atherosclerotic heart disease of native coronary artery without angina pectoris: Secondary | ICD-10-CM | POA: Diagnosis not present

## 2019-08-11 DIAGNOSIS — M160 Bilateral primary osteoarthritis of hip: Secondary | ICD-10-CM | POA: Diagnosis not present

## 2019-08-11 DIAGNOSIS — K219 Gastro-esophageal reflux disease without esophagitis: Secondary | ICD-10-CM | POA: Diagnosis not present

## 2019-08-11 DIAGNOSIS — N183 Chronic kidney disease, stage 3 (moderate): Secondary | ICD-10-CM | POA: Diagnosis not present

## 2019-08-15 ENCOUNTER — Other Ambulatory Visit: Payer: Self-pay

## 2019-08-15 ENCOUNTER — Ambulatory Visit: Payer: Medicare Other | Admitting: *Deleted

## 2019-08-15 DIAGNOSIS — I2510 Atherosclerotic heart disease of native coronary artery without angina pectoris: Secondary | ICD-10-CM | POA: Diagnosis not present

## 2019-08-15 DIAGNOSIS — K635 Polyp of colon: Secondary | ICD-10-CM | POA: Diagnosis not present

## 2019-08-15 DIAGNOSIS — M8580 Other specified disorders of bone density and structure, unspecified site: Secondary | ICD-10-CM | POA: Diagnosis not present

## 2019-08-15 DIAGNOSIS — I6529 Occlusion and stenosis of unspecified carotid artery: Secondary | ICD-10-CM | POA: Diagnosis not present

## 2019-08-15 DIAGNOSIS — Z794 Long term (current) use of insulin: Secondary | ICD-10-CM | POA: Diagnosis not present

## 2019-08-15 DIAGNOSIS — E1122 Type 2 diabetes mellitus with diabetic chronic kidney disease: Secondary | ICD-10-CM | POA: Diagnosis not present

## 2019-08-15 DIAGNOSIS — I130 Hypertensive heart and chronic kidney disease with heart failure and stage 1 through stage 4 chronic kidney disease, or unspecified chronic kidney disease: Secondary | ICD-10-CM | POA: Diagnosis not present

## 2019-08-15 DIAGNOSIS — I5032 Chronic diastolic (congestive) heart failure: Secondary | ICD-10-CM | POA: Diagnosis not present

## 2019-08-15 DIAGNOSIS — I252 Old myocardial infarction: Secondary | ICD-10-CM | POA: Diagnosis not present

## 2019-08-15 DIAGNOSIS — M160 Bilateral primary osteoarthritis of hip: Secondary | ICD-10-CM | POA: Diagnosis not present

## 2019-08-15 DIAGNOSIS — M479 Spondylosis, unspecified: Secondary | ICD-10-CM | POA: Diagnosis not present

## 2019-08-15 DIAGNOSIS — Z9181 History of falling: Secondary | ICD-10-CM | POA: Diagnosis not present

## 2019-08-15 DIAGNOSIS — R001 Bradycardia, unspecified: Secondary | ICD-10-CM

## 2019-08-15 DIAGNOSIS — N183 Chronic kidney disease, stage 3 (moderate): Secondary | ICD-10-CM | POA: Diagnosis not present

## 2019-08-15 DIAGNOSIS — I083 Combined rheumatic disorders of mitral, aortic and tricuspid valves: Secondary | ICD-10-CM | POA: Diagnosis not present

## 2019-08-15 DIAGNOSIS — Z8673 Personal history of transient ischemic attack (TIA), and cerebral infarction without residual deficits: Secondary | ICD-10-CM | POA: Diagnosis not present

## 2019-08-15 DIAGNOSIS — E785 Hyperlipidemia, unspecified: Secondary | ICD-10-CM | POA: Diagnosis not present

## 2019-08-15 DIAGNOSIS — D631 Anemia in chronic kidney disease: Secondary | ICD-10-CM | POA: Diagnosis not present

## 2019-08-15 DIAGNOSIS — K219 Gastro-esophageal reflux disease without esophagitis: Secondary | ICD-10-CM | POA: Diagnosis not present

## 2019-08-15 DIAGNOSIS — M419 Scoliosis, unspecified: Secondary | ICD-10-CM | POA: Diagnosis not present

## 2019-08-15 DIAGNOSIS — M1712 Unilateral primary osteoarthritis, left knee: Secondary | ICD-10-CM | POA: Diagnosis not present

## 2019-08-15 NOTE — Progress Notes (Signed)
Wound site with diffuse edema, soft to touch, decreased edema from previous visit. Pulse 88 bpm and regular. Encouraged family to continue to monitor her wound site and ensure medications taken regularly.

## 2019-08-15 NOTE — Patient Instructions (Signed)
Call office if swelling increases or drainage from site occurs.

## 2019-08-17 DIAGNOSIS — K635 Polyp of colon: Secondary | ICD-10-CM | POA: Diagnosis not present

## 2019-08-17 DIAGNOSIS — I5032 Chronic diastolic (congestive) heart failure: Secondary | ICD-10-CM | POA: Diagnosis not present

## 2019-08-17 DIAGNOSIS — I6529 Occlusion and stenosis of unspecified carotid artery: Secondary | ICD-10-CM | POA: Diagnosis not present

## 2019-08-17 DIAGNOSIS — I130 Hypertensive heart and chronic kidney disease with heart failure and stage 1 through stage 4 chronic kidney disease, or unspecified chronic kidney disease: Secondary | ICD-10-CM | POA: Diagnosis not present

## 2019-08-17 DIAGNOSIS — E1122 Type 2 diabetes mellitus with diabetic chronic kidney disease: Secondary | ICD-10-CM | POA: Diagnosis not present

## 2019-08-17 DIAGNOSIS — M1712 Unilateral primary osteoarthritis, left knee: Secondary | ICD-10-CM | POA: Diagnosis not present

## 2019-08-17 DIAGNOSIS — N183 Chronic kidney disease, stage 3 (moderate): Secondary | ICD-10-CM | POA: Diagnosis not present

## 2019-08-17 DIAGNOSIS — M160 Bilateral primary osteoarthritis of hip: Secondary | ICD-10-CM | POA: Diagnosis not present

## 2019-08-17 DIAGNOSIS — M479 Spondylosis, unspecified: Secondary | ICD-10-CM | POA: Diagnosis not present

## 2019-08-17 DIAGNOSIS — I2510 Atherosclerotic heart disease of native coronary artery without angina pectoris: Secondary | ICD-10-CM | POA: Diagnosis not present

## 2019-08-17 DIAGNOSIS — Z8673 Personal history of transient ischemic attack (TIA), and cerebral infarction without residual deficits: Secondary | ICD-10-CM | POA: Diagnosis not present

## 2019-08-17 DIAGNOSIS — Z794 Long term (current) use of insulin: Secondary | ICD-10-CM | POA: Diagnosis not present

## 2019-08-17 DIAGNOSIS — I252 Old myocardial infarction: Secondary | ICD-10-CM | POA: Diagnosis not present

## 2019-08-17 DIAGNOSIS — M8580 Other specified disorders of bone density and structure, unspecified site: Secondary | ICD-10-CM | POA: Diagnosis not present

## 2019-08-17 DIAGNOSIS — K219 Gastro-esophageal reflux disease without esophagitis: Secondary | ICD-10-CM | POA: Diagnosis not present

## 2019-08-17 DIAGNOSIS — I083 Combined rheumatic disorders of mitral, aortic and tricuspid valves: Secondary | ICD-10-CM | POA: Diagnosis not present

## 2019-08-17 DIAGNOSIS — E785 Hyperlipidemia, unspecified: Secondary | ICD-10-CM | POA: Diagnosis not present

## 2019-08-17 DIAGNOSIS — D631 Anemia in chronic kidney disease: Secondary | ICD-10-CM | POA: Diagnosis not present

## 2019-08-17 DIAGNOSIS — Z9181 History of falling: Secondary | ICD-10-CM | POA: Diagnosis not present

## 2019-08-17 DIAGNOSIS — M419 Scoliosis, unspecified: Secondary | ICD-10-CM | POA: Diagnosis not present

## 2019-08-21 DIAGNOSIS — M419 Scoliosis, unspecified: Secondary | ICD-10-CM | POA: Diagnosis not present

## 2019-08-21 DIAGNOSIS — M8580 Other specified disorders of bone density and structure, unspecified site: Secondary | ICD-10-CM | POA: Diagnosis not present

## 2019-08-21 DIAGNOSIS — I252 Old myocardial infarction: Secondary | ICD-10-CM | POA: Diagnosis not present

## 2019-08-21 DIAGNOSIS — I2510 Atherosclerotic heart disease of native coronary artery without angina pectoris: Secondary | ICD-10-CM | POA: Diagnosis not present

## 2019-08-21 DIAGNOSIS — I130 Hypertensive heart and chronic kidney disease with heart failure and stage 1 through stage 4 chronic kidney disease, or unspecified chronic kidney disease: Secondary | ICD-10-CM | POA: Diagnosis not present

## 2019-08-21 DIAGNOSIS — M479 Spondylosis, unspecified: Secondary | ICD-10-CM | POA: Diagnosis not present

## 2019-08-21 DIAGNOSIS — Z794 Long term (current) use of insulin: Secondary | ICD-10-CM | POA: Diagnosis not present

## 2019-08-21 DIAGNOSIS — M160 Bilateral primary osteoarthritis of hip: Secondary | ICD-10-CM | POA: Diagnosis not present

## 2019-08-21 DIAGNOSIS — E785 Hyperlipidemia, unspecified: Secondary | ICD-10-CM | POA: Diagnosis not present

## 2019-08-21 DIAGNOSIS — Z8673 Personal history of transient ischemic attack (TIA), and cerebral infarction without residual deficits: Secondary | ICD-10-CM | POA: Diagnosis not present

## 2019-08-21 DIAGNOSIS — I6529 Occlusion and stenosis of unspecified carotid artery: Secondary | ICD-10-CM | POA: Diagnosis not present

## 2019-08-21 DIAGNOSIS — M1712 Unilateral primary osteoarthritis, left knee: Secondary | ICD-10-CM | POA: Diagnosis not present

## 2019-08-21 DIAGNOSIS — E1122 Type 2 diabetes mellitus with diabetic chronic kidney disease: Secondary | ICD-10-CM | POA: Diagnosis not present

## 2019-08-21 DIAGNOSIS — N183 Chronic kidney disease, stage 3 (moderate): Secondary | ICD-10-CM | POA: Diagnosis not present

## 2019-08-21 DIAGNOSIS — I5032 Chronic diastolic (congestive) heart failure: Secondary | ICD-10-CM | POA: Diagnosis not present

## 2019-08-21 DIAGNOSIS — K635 Polyp of colon: Secondary | ICD-10-CM | POA: Diagnosis not present

## 2019-08-21 DIAGNOSIS — Z9181 History of falling: Secondary | ICD-10-CM | POA: Diagnosis not present

## 2019-08-21 DIAGNOSIS — D631 Anemia in chronic kidney disease: Secondary | ICD-10-CM | POA: Diagnosis not present

## 2019-08-21 DIAGNOSIS — I083 Combined rheumatic disorders of mitral, aortic and tricuspid valves: Secondary | ICD-10-CM | POA: Diagnosis not present

## 2019-08-21 DIAGNOSIS — K219 Gastro-esophageal reflux disease without esophagitis: Secondary | ICD-10-CM | POA: Diagnosis not present

## 2019-08-22 ENCOUNTER — Other Ambulatory Visit: Payer: Self-pay | Admitting: Cardiology

## 2019-08-22 DIAGNOSIS — N183 Chronic kidney disease, stage 3 (moderate): Secondary | ICD-10-CM | POA: Diagnosis not present

## 2019-08-22 DIAGNOSIS — I252 Old myocardial infarction: Secondary | ICD-10-CM | POA: Diagnosis not present

## 2019-08-22 DIAGNOSIS — E1122 Type 2 diabetes mellitus with diabetic chronic kidney disease: Secondary | ICD-10-CM | POA: Diagnosis not present

## 2019-08-22 DIAGNOSIS — I130 Hypertensive heart and chronic kidney disease with heart failure and stage 1 through stage 4 chronic kidney disease, or unspecified chronic kidney disease: Secondary | ICD-10-CM | POA: Diagnosis not present

## 2019-08-22 DIAGNOSIS — K219 Gastro-esophageal reflux disease without esophagitis: Secondary | ICD-10-CM | POA: Diagnosis not present

## 2019-08-22 DIAGNOSIS — I2510 Atherosclerotic heart disease of native coronary artery without angina pectoris: Secondary | ICD-10-CM | POA: Diagnosis not present

## 2019-08-22 DIAGNOSIS — I5032 Chronic diastolic (congestive) heart failure: Secondary | ICD-10-CM | POA: Diagnosis not present

## 2019-08-22 DIAGNOSIS — M8580 Other specified disorders of bone density and structure, unspecified site: Secondary | ICD-10-CM | POA: Diagnosis not present

## 2019-08-22 DIAGNOSIS — M160 Bilateral primary osteoarthritis of hip: Secondary | ICD-10-CM | POA: Diagnosis not present

## 2019-08-22 DIAGNOSIS — Z794 Long term (current) use of insulin: Secondary | ICD-10-CM | POA: Diagnosis not present

## 2019-08-22 DIAGNOSIS — M1712 Unilateral primary osteoarthritis, left knee: Secondary | ICD-10-CM | POA: Diagnosis not present

## 2019-08-22 DIAGNOSIS — M479 Spondylosis, unspecified: Secondary | ICD-10-CM | POA: Diagnosis not present

## 2019-08-22 DIAGNOSIS — K635 Polyp of colon: Secondary | ICD-10-CM | POA: Diagnosis not present

## 2019-08-22 DIAGNOSIS — Z9181 History of falling: Secondary | ICD-10-CM | POA: Diagnosis not present

## 2019-08-22 DIAGNOSIS — Z8673 Personal history of transient ischemic attack (TIA), and cerebral infarction without residual deficits: Secondary | ICD-10-CM | POA: Diagnosis not present

## 2019-08-22 DIAGNOSIS — D631 Anemia in chronic kidney disease: Secondary | ICD-10-CM | POA: Diagnosis not present

## 2019-08-22 DIAGNOSIS — I6529 Occlusion and stenosis of unspecified carotid artery: Secondary | ICD-10-CM | POA: Diagnosis not present

## 2019-08-22 DIAGNOSIS — I083 Combined rheumatic disorders of mitral, aortic and tricuspid valves: Secondary | ICD-10-CM | POA: Diagnosis not present

## 2019-08-22 DIAGNOSIS — M419 Scoliosis, unspecified: Secondary | ICD-10-CM | POA: Diagnosis not present

## 2019-08-22 DIAGNOSIS — E785 Hyperlipidemia, unspecified: Secondary | ICD-10-CM | POA: Diagnosis not present

## 2019-08-22 NOTE — Progress Notes (Signed)
HPI The patient presents for followup of CAD, atrial fib and bradycardia.   She was admitted in late August with tachybrady and she had a pacemaker placed.  She did have atrial fib on device interrogation recently.   She wanted to have a follow up before starting anticoagulation.    She has been doing relatively well since getting out of the hospital.  She walks with a walker and has worked with physical therapy.  She does feel her heart rate going fast at times.  She has not had any presyncope or syncope.  She has no new shortness of breath, PND or orthopnea.  She has no chest pressure, neck or arm discomfort.  She had some mild lower extremity swelling    Allergies  Allergen Reactions  . Advil [Ibuprofen] Swelling  . Heparin Other (See Comments)    Confusion, hallucinations.   . Propoxyphene N-Acetaminophen Other (See Comments)    Numbness all over. "floating" sensation.    Current Outpatient Medications  Medication Sig Dispense Refill  . albuterol (PROVENTIL HFA;VENTOLIN HFA) 108 (90 Base) MCG/ACT inhaler Inhale 2 puffs into the lungs every 6 (six) hours as needed for wheezing or shortness of breath. 1 Inhaler 5  . atorvastatin (LIPITOR) 20 MG tablet Take 1 tablet (20 mg total) by mouth at bedtime. 90 tablet 3  . Carboxymethylcellul-Glycerin (CLEAR EYES FOR DRY EYES) 1-0.25 % SOLN Place 1 drop into both eyes daily as needed.    Marland Kitchen esomeprazole (NEXIUM) 40 MG capsule Take 1 capsule (40 mg total) by mouth daily. (Needs to be seen before next refill) 30 capsule 0  . ferrous sulfate 324 (65 Fe) MG TBEC Take 324 mg by mouth daily.     . furosemide (LASIX) 20 MG tablet Take 1 tablet (20 mg total) by mouth every morning. 30 tablet   . hydrALAZINE (APRESOLINE) 25 MG tablet Take 1 tablet (25 mg total) by mouth 3 (three) times daily. 90 tablet 0  . insulin lispro (HUMALOG KWIKPEN) 100 UNIT/ML KiwkPen Inject 0.04-0.1 mLs (4-10 Units total) into the skin daily as needed (high blood sugar).  (Patient taking differently: Inject 5-15 Units into the skin daily as needed (for high blood sugar levels over 250). ) 15 mL 5  . LEVEMIR FLEXTOUCH 100 UNIT/ML Pen INJECT 24 UNITS EACH MORNING (Patient taking differently: Inject 24 Units into the skin every morning. ) 6 mL 0  . magnesium hydroxide (MILK OF MAGNESIA) 400 MG/5ML suspension Take 30 mLs by mouth daily as needed for mild constipation.    . metFORMIN (GLUCOPHAGE-XR) 500 MG 24 hr tablet TAKE 1 TABLET 2 TIMES A DAY WITH BREAKFAST & DINNER. (Patient taking differently: Take 500 mg by mouth 2 (two) times daily. ) 60 tablet 0  . methenamine (HIPREX) 1 g tablet Take 1 g by mouth 2 (two) times daily with a meal.     . metoprolol tartrate (LOPRESSOR) 50 MG tablet Take 1.5 tablets (75 mg total) by mouth 2 (two) times daily. 90 tablet 11  . Multiple Vitamins-Iron (DAILY VITAMINS/IRON/BETA CAROT PO) Take 1 tablet by mouth at bedtime.    . nitroGLYCERIN (NITROSTAT) 0.4 MG SL tablet PLACE (1) TABLET UNDER TONGUE EVERY 5 MINUTES UP TO (3) DOSES. IF NO RELIEF CALL 911. 25 tablet 0  . PARoxetine (PAXIL) 20 MG tablet TAKE 1 TABLET BY MOUTH EVERY MORNING. 30 tablet 0  . polyethylene glycol (MIRALAX) packet Take 17 g by mouth daily. (Patient taking differently: Take 17 g by mouth  daily as needed for mild constipation or moderate constipation. ) 30 each 0  . apixaban (ELIQUIS) 2.5 MG TABS tablet Take 1 tablet (2.5 mg total) by mouth 2 (two) times daily. 60 tablet 11   No current facility-administered medications for this visit.     Past Medical History:  Diagnosis Date  . Anemia   . Anxiety   . Aortic stenosis   . Arthritis    back, knees, and hips  . Asthma    with allergies  . Balance problems   . CAD (coronary artery disease)   . Carotid stenosis   . CHF (congestive heart failure) ([place])    EF preserved Echo 2012  . CKD (chronic kidney disease)   . 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 Dementia ([place])    "a little"  . Depression   . Diabetes mellitus    type 2  . GERD (gastroesophageal reflux disease)   . Hypertension   . Iron deficiency anemia 05/27/2011  . Myocardial infarction ([place])   . Renal insufficiency   . Scoliosis   . Vertigo    "when sugar gets low"  . Vision loss    left eye-"half blind"    Past Surgical History:  Procedure Laterality Date  . APPENDECTOMY     with hysterectomy  . CATARACT EXTRACTION Bilateral 5 years ago  . CHOLECYSTECTOMY    . COLONOSCOPY  2005   Dr. Amedeo Plenty: Diverticulosis  . COLONOSCOPY WITH PROPOFOL N/A 02/14/2019   Procedure: COLONOSCOPY WITH PROPOFOL;  Surgeon: Danie Binder, MD;  [facility];  Service: Endoscopy;  Laterality: N/A;  . CORONARY ANGIOPLASTY     prior to 2006 5 stents  . CORONARY ARTERY BYPASS GRAFT  2006  . I & D of [place]  April 2013  . KNEE SURGERY Right   . PACEMAKER IMPLANT N/A 07/24/2019   Procedure: PACEMAKER IMPLANT;  Surgeon: Evans Lance, MD;  Location: [place] CV LAB;  Service: Cardiovascular;  Laterality: N/A;  . POLYPECTOMY  02/14/2019   Procedure: POLYPECTOMY;  Surgeon: Danie Binder, MD;  [facility];  Service: Endoscopy;;  cecal polyp  . TOTAL ABDOMINAL HYSTERECTOMY  ~85 years old   complete, with tumor removal  . TOTAL KNEE ARTHROPLASTY Right 08/13/2015   Procedure: RIGHT  TOTAL KNEE ARTHROPLASTY;  Surgeon: Paralee Cancel, MD;  Location: [facility];  Service: Orthopedics;  Laterality: Right;    ROS: Positive for none.  Otherwise as stated in the HPI and negative for all other systems.  PHYSICAL EXAM BP (!) 153/91   Pulse 91   Ht 5\' 6"  (1.676 m)   Wt 150 lb (68 kg)   BMI 24.21 kg/m   GENERAL:  Well appearing NECK:  No jugular venous distention, waveform within normal limits, carotid upstroke brisk and symmetric, no bruits, no thyromegaly LUNGS:  Clear to auscultation bilaterally CHEST:   Pacemaker pocket with mild slight fluctuance  underneath but no erythema or bruising or drainage HEART:  PMI not displaced or sustained,S1 and S2 within normal limits, no S3,  no clicks, no rubs, 3 out of 6 apical systolic murmur radiating slightly at the scapular tract murmurs, irregular ABD:  Flat, positive bowel sounds normal in frequency in pitch, no bruits, no rebound, no guarding, no midline pulsatile mass, no hepatomegaly, no splenomegaly EXT:  2 plus pulses upper and decreased DP/PT bilateral, mild edema, no  cyanosis no clubbing    EKG: NA  Lab Results  Component Value Date   CHOL 150 10/18/2018   TRIG 130 10/18/2018   HDL 82 10/18/2018   LDLCALC 42 10/18/2018   Lab Results  Component Value Date   CREATININE 1.72 (H) 08/06/2019    ASSESSMENT AND PLAN  ATRIAL FIB:  Ms. [name] has a CHA2DS2 - VASc score of 8.  I talked to her and her daughter today about risk benefits and she needs to be on anticoagulation as she has no absolute contraindication.  She does have anemia and takes iron but will need to keep a close eye on her hemoglobin.  She is not having any active GI bleeding symptoms.  I will start Eliquis 2.5 twice daily.  Also her heart rate is elevated not going to increase her metoprolol to 75 mg twice daily.    CAD:   The patient has no new sypmtoms.  No further cardiovascular testing is indicated.  We will continue with aggressive risk reduction and meds as listed.  CAROTID STENOSIS:  This was mild in 2017.  I will follow this with repeat carotid Dopplers in the future.   AS: This was moderate last year on echo.  No change in therapy.   HTN: Her blood pressure is elevated I will manage this in the context of treating her heart rate as well.   DM:  Her A1C is up to 8.5 from 6.8.  I will defer management to  [name], MD  DYSLIPIDEMIA:  LDL was controlled as above.    CKD:  Creat was elevated at 1.72 which is her baseline.  No change in therapy.  SNORING:   She has some snoring and possibly  restless legs but I think a sleep study might be difficult.  I talked to her about this in the future.

## 2019-08-22 NOTE — Telephone Encounter (Signed)
Patient scheduled to see Dr Percival Spanish 9/16 in [place]

## 2019-08-23 ENCOUNTER — Ambulatory Visit (INDEPENDENT_AMBULATORY_CARE_PROVIDER_SITE_OTHER): Payer: Medicare Other | Admitting: Cardiology

## 2019-08-23 ENCOUNTER — Encounter: Payer: Self-pay | Admitting: Cardiology

## 2019-08-23 VITALS — BP 153/91 | HR 91 | Ht 66.0 in | Wt 150.0 lb

## 2019-08-23 DIAGNOSIS — I480 Paroxysmal atrial fibrillation: Secondary | ICD-10-CM | POA: Diagnosis not present

## 2019-08-23 DIAGNOSIS — I25709 Atherosclerosis of coronary artery bypass graft(s), unspecified, with unspecified angina pectoris: Secondary | ICD-10-CM | POA: Diagnosis not present

## 2019-08-23 DIAGNOSIS — I10 Essential (primary) hypertension: Secondary | ICD-10-CM | POA: Diagnosis not present

## 2019-08-23 DIAGNOSIS — I5032 Chronic diastolic (congestive) heart failure: Secondary | ICD-10-CM | POA: Diagnosis not present

## 2019-08-23 DIAGNOSIS — R001 Bradycardia, unspecified: Secondary | ICD-10-CM

## 2019-08-23 MED ORDER — APIXABAN 2.5 MG PO TABS: 2.5000 mg | ORAL_TABLET | Freq: Two times a day (BID) | ORAL | 11 refills | Status: AC

## 2019-08-23 MED ORDER — METOPROLOL TARTRATE 50 MG PO TABS: 75.0000 mg | ORAL_TABLET | Freq: Two times a day (BID) | ORAL | 11 refills | Status: DC

## 2019-08-23 NOTE — Patient Instructions (Addendum)
Medication Instructions:  Please increase your Lopressor (Metoprolol tartrate) to 50 mg - take 1 and 1/2 tablets twice a day - For a total of 75 mg twice a day. Start Eliquis 2.5 mg one tablet twice a day. Continue all other medications as listed.  If you need a refill on your cardiac medications before your next appointment, please call your pharmacy.   Follow-Up: Follow up with Dr Percival Spanish in 1 month.  Thank you for choosing [facility]!!

## 2019-08-24 ENCOUNTER — Telehealth: Payer: Self-pay | Admitting: Cardiology

## 2019-08-24 NOTE — Telephone Encounter (Signed)
Returned call to [facility]. Explained LUE restrictions in place until 6 weeks post-implant. Reviewed restrictions. Malorie verbalizes understanding, no further questions at this time.

## 2019-08-24 NOTE — Telephone Encounter (Signed)
[name], from [facility] called wanting to know if they can start an U/E excerise anprogram at home or is patient on any restrictions.

## 2019-08-24 NOTE — Telephone Encounter (Signed)
[name] from [facility] is calling to see if okay for pt to start some upper extremity resistance training pt had PM implant done about 4 weeks ago Will forward to device pool to address./cy

## 2019-08-25 ENCOUNTER — Ambulatory Visit (INDEPENDENT_AMBULATORY_CARE_PROVIDER_SITE_OTHER): Payer: Medicare Other | Admitting: Family Medicine

## 2019-08-25 ENCOUNTER — Encounter: Payer: Self-pay | Admitting: Family Medicine

## 2019-08-25 VITALS — BP 156/105 | HR 84

## 2019-08-25 DIAGNOSIS — K5900 Constipation, unspecified: Secondary | ICD-10-CM | POA: Diagnosis not present

## 2019-08-25 DIAGNOSIS — I10 Essential (primary) hypertension: Secondary | ICD-10-CM

## 2019-08-25 NOTE — Progress Notes (Signed)
Virtual Visit via Telephone Note  I connected with [name] on 08/25/19 at 10:07 AM by telephone and verified that I am speaking with the correct person using two identifiers. [name] is currently located at home and her daughter is currently with her during this visit, which she is okay with. The provider, [name], FNP is located in their home at time of visit.  I discussed the limitations, risks, security and privacy concerns of performing an evaluation and management service by telephone and the availability of in person appointments. I also discussed with the patient that there may be a patient responsible charge related to this service. The patient expressed understanding and agreed to proceed.  Subjective: PCP: [name], MD  Chief Complaint  Patient presents with  . Constipation  . Hypertension   Patient reports an elevated blood pressure this morning. The daughter reports a systolic BP of 235. While on the phone she re-checks the blood pressure and reports a reading of 156/105. Patient just took morning blood pressure medication 20 minutes ago. They do have an upper arm cuff. O2 saturation 93%. HR 84.   Patient is also concerned about constipation. She did not have a bowel movement yesterday and this morning she felt nauseated. She took some milk of magnesia which was effective. After having a couple of bowel movements the nausea resolved. She does report straining with bowel movements. She has not been taking the Miralax that is on her medication list.    ROS: Per HPI  Current Outpatient Medications:  .  albuterol (PROVENTIL HFA;VENTOLIN HFA) 108 (90 Base) MCG/ACT inhaler, Inhale 2 puffs into the lungs every 6 (six) hours as needed for wheezing or shortness of breath., Disp: 1 Inhaler, Rfl: 5 .  apixaban (ELIQUIS) 2.5 MG TABS tablet, Take 1 tablet (2.5 mg total) by mouth 2 (two) times daily., Disp: 60 tablet, Rfl: 11 .  atorvastatin (LIPITOR) 20 MG  tablet, Take 1 tablet (20 mg total) by mouth at bedtime., Disp: 90 tablet, Rfl: 3 .  Carboxymethylcellul-Glycerin (CLEAR EYES FOR DRY EYES) 1-0.25 % SOLN, Place 1 drop into both eyes daily as needed., Disp: , Rfl:  .  esomeprazole (NEXIUM) 40 MG capsule, Take 1 capsule (40 mg total) by mouth daily. (Needs to be seen before next refill), Disp: 30 capsule, Rfl: 0 .  ferrous sulfate 324 (65 Fe) MG TBEC, Take 324 mg by mouth daily. , Disp: , Rfl:  .  furosemide (LASIX) 20 MG tablet, Take 1 tablet (20 mg total) by mouth every morning., Disp: 30 tablet, Rfl:  .  hydrALAZINE (APRESOLINE) 25 MG tablet, Take 1 tablet (25 mg total) by mouth 3 (three) times daily., Disp: 90 tablet, Rfl: 0 .  insulin lispro (HUMALOG KWIKPEN) 100 UNIT/ML KiwkPen, Inject 0.04-0.1 mLs (4-10 Units total) into the skin daily as needed (high blood sugar). (Patient taking differently: Inject 5-15 Units into the skin daily as needed (for high blood sugar levels over 250). ), Disp: 15 mL, Rfl: 5 .  LEVEMIR FLEXTOUCH 100 UNIT/ML Pen, INJECT 24 UNITS EACH MORNING (Patient taking differently: Inject 24 Units into the skin every morning. ), Disp: 6 mL, Rfl: 0 .  magnesium hydroxide (MILK OF MAGNESIA) 400 MG/5ML suspension, Take 30 mLs by mouth daily as needed for mild constipation., Disp: , Rfl:  .  metFORMIN (GLUCOPHAGE-XR) 500 MG 24 hr tablet, TAKE 1 TABLET 2 TIMES A DAY WITH BREAKFAST & DINNER. (Patient taking differently: Take 500 mg by mouth  2 (two) times daily. ), Disp: 60 tablet, Rfl: 0 .  methenamine (HIPREX) 1 g tablet, Take 1 g by mouth 2 (two) times daily with a meal. , Disp: , Rfl:  .  metoprolol tartrate (LOPRESSOR) 50 MG tablet, Take 1.5 tablets (75 mg total) by mouth 2 (two) times daily., Disp: 90 tablet, Rfl: 11 .  Multiple Vitamins-Iron (DAILY VITAMINS/IRON/BETA CAROT PO), Take 1 tablet by mouth at bedtime., Disp: , Rfl:  .  nitroGLYCERIN (NITROSTAT) 0.4 MG SL tablet, PLACE (1) TABLET UNDER TONGUE EVERY 5 MINUTES UP TO (3)  DOSES. IF NO RELIEF CALL 911., Disp: 25 tablet, Rfl: 0 .  PARoxetine (PAXIL) 20 MG tablet, TAKE 1 TABLET BY MOUTH EVERY MORNING., Disp: 30 tablet, Rfl: 0 .  polyethylene glycol (MIRALAX) packet, Take 17 g by mouth daily. (Patient taking differently: Take 17 g by mouth daily as needed for mild constipation or moderate constipation. ), Disp: 30 each, Rfl: 0  Allergies  Allergen Reactions  . Advil [Ibuprofen] Swelling  . Heparin Other (See Comments)    Confusion, hallucinations.   . Propoxyphene N-Acetaminophen Other (See Comments)    Numbness all over. "floating" sensation.   Past Medical History:  Diagnosis Date  . Anemia   . Anxiety   . Aortic stenosis   . Arthritis    back, knees, and hips  . Asthma    with allergies  . Balance problems   . CAD (coronary artery disease)   . Carotid stenosis   . CHF (congestive heart failure) ([place])    EF preserved Echo 2012  . CKD (chronic kidney disease)   . 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 Dementia ([place])    "a little"  . Depression   . Diabetes mellitus    type 2  . GERD (gastroesophageal reflux disease)   . Hypertension   . Iron deficiency anemia 05/27/2011  . Myocardial infarction ([place])   . Renal insufficiency   . Scoliosis   . Vertigo    "when sugar gets low"  . Vision loss    left eye-"half blind"    Observations/Objective: A&O  No respiratory distress or wheezing audible over the phone Mood, judgement, and thought processes all WNL  Assessment and Plan: 1. Essential hypertension - Advised to take BP 30 minutes to 1 hour after taking medication when calm and sitting. Patient to keep a log and bring to her appointments with PCP and cardiologist. Educated that we expect BP to be elevated if taken before medications.   2. Constipation, unspecified constipation type - Encouraged to use Miralax 1 capful in 6-8 oz beverage of choice once  daily as needed for constipation. Education provided on constipation.    Follow Up Instructions:  I discussed the assessment and treatment plan with the patient. The patient was provided an opportunity to ask questions and all were answered. The patient agreed with the plan and demonstrated an understanding of the instructions.   The patient was advised to call back or seek an in-person evaluation if the symptoms worsen or if the condition fails to improve as anticipated.  The above assessment and management plan was discussed with the patient. The patient verbalized understanding of and has agreed to the management plan. Patient is aware to call the clinic if symptoms persist or worsen. Patient is aware when to return to the clinic for a follow-up visit. Patient educated on when it  is appropriate to go to the emergency department.   Time call ended: 10:20 AM  I provided 16 minutes of non-face-to-face time during this encounter.  Hendricks Limes, MSN, APRN, FNP-C [facility] 08/25/19

## 2019-08-25 NOTE — Patient Instructions (Signed)

## 2019-08-28 ENCOUNTER — Other Ambulatory Visit: Payer: Self-pay | Admitting: *Deleted

## 2019-08-28 ENCOUNTER — Other Ambulatory Visit: Payer: Self-pay

## 2019-08-28 DIAGNOSIS — K635 Polyp of colon: Secondary | ICD-10-CM | POA: Diagnosis not present

## 2019-08-28 DIAGNOSIS — I5032 Chronic diastolic (congestive) heart failure: Secondary | ICD-10-CM | POA: Diagnosis not present

## 2019-08-28 DIAGNOSIS — Z794 Long term (current) use of insulin: Secondary | ICD-10-CM | POA: Diagnosis not present

## 2019-08-28 DIAGNOSIS — I130 Hypertensive heart and chronic kidney disease with heart failure and stage 1 through stage 4 chronic kidney disease, or unspecified chronic kidney disease: Secondary | ICD-10-CM | POA: Diagnosis not present

## 2019-08-28 DIAGNOSIS — I6529 Occlusion and stenosis of unspecified carotid artery: Secondary | ICD-10-CM | POA: Diagnosis not present

## 2019-08-28 DIAGNOSIS — M160 Bilateral primary osteoarthritis of hip: Secondary | ICD-10-CM | POA: Diagnosis not present

## 2019-08-28 DIAGNOSIS — E785 Hyperlipidemia, unspecified: Secondary | ICD-10-CM | POA: Diagnosis not present

## 2019-08-28 DIAGNOSIS — N183 Chronic kidney disease, stage 3 (moderate): Secondary | ICD-10-CM | POA: Diagnosis not present

## 2019-08-28 DIAGNOSIS — M479 Spondylosis, unspecified: Secondary | ICD-10-CM | POA: Diagnosis not present

## 2019-08-28 DIAGNOSIS — I2510 Atherosclerotic heart disease of native coronary artery without angina pectoris: Secondary | ICD-10-CM | POA: Diagnosis not present

## 2019-08-28 DIAGNOSIS — Z9181 History of falling: Secondary | ICD-10-CM | POA: Diagnosis not present

## 2019-08-28 DIAGNOSIS — D631 Anemia in chronic kidney disease: Secondary | ICD-10-CM | POA: Diagnosis not present

## 2019-08-28 DIAGNOSIS — I252 Old myocardial infarction: Secondary | ICD-10-CM | POA: Diagnosis not present

## 2019-08-28 DIAGNOSIS — Z8673 Personal history of transient ischemic attack (TIA), and cerebral infarction without residual deficits: Secondary | ICD-10-CM | POA: Diagnosis not present

## 2019-08-28 DIAGNOSIS — E1122 Type 2 diabetes mellitus with diabetic chronic kidney disease: Secondary | ICD-10-CM | POA: Diagnosis not present

## 2019-08-28 DIAGNOSIS — I083 Combined rheumatic disorders of mitral, aortic and tricuspid valves: Secondary | ICD-10-CM | POA: Diagnosis not present

## 2019-08-28 DIAGNOSIS — M8580 Other specified disorders of bone density and structure, unspecified site: Secondary | ICD-10-CM | POA: Diagnosis not present

## 2019-08-28 DIAGNOSIS — M419 Scoliosis, unspecified: Secondary | ICD-10-CM | POA: Diagnosis not present

## 2019-08-28 DIAGNOSIS — K219 Gastro-esophageal reflux disease without esophagitis: Secondary | ICD-10-CM | POA: Diagnosis not present

## 2019-08-28 DIAGNOSIS — M1712 Unilateral primary osteoarthritis, left knee: Secondary | ICD-10-CM | POA: Diagnosis not present

## 2019-08-28 MED ORDER — HYDRALAZINE HCL 25 MG PO TABS: 25.0000 mg | ORAL_TABLET | Freq: Three times a day (TID) | ORAL | 1 refills | Status: DC

## 2019-08-28 NOTE — Telephone Encounter (Signed)
Refill

## 2019-08-28 NOTE — Telephone Encounter (Signed)
Refilled as requested by [facility]

## 2019-08-29 ENCOUNTER — Other Ambulatory Visit: Payer: Self-pay | Admitting: Family Medicine

## 2019-08-29 DIAGNOSIS — I2510 Atherosclerotic heart disease of native coronary artery without angina pectoris: Secondary | ICD-10-CM | POA: Diagnosis not present

## 2019-08-29 DIAGNOSIS — M419 Scoliosis, unspecified: Secondary | ICD-10-CM | POA: Diagnosis not present

## 2019-08-29 DIAGNOSIS — K219 Gastro-esophageal reflux disease without esophagitis: Secondary | ICD-10-CM | POA: Diagnosis not present

## 2019-08-29 DIAGNOSIS — M160 Bilateral primary osteoarthritis of hip: Secondary | ICD-10-CM | POA: Diagnosis not present

## 2019-08-29 DIAGNOSIS — N3946 Mixed incontinence: Secondary | ICD-10-CM

## 2019-08-29 DIAGNOSIS — K635 Polyp of colon: Secondary | ICD-10-CM | POA: Diagnosis not present

## 2019-08-29 DIAGNOSIS — I083 Combined rheumatic disorders of mitral, aortic and tricuspid valves: Secondary | ICD-10-CM | POA: Diagnosis not present

## 2019-08-29 DIAGNOSIS — I5032 Chronic diastolic (congestive) heart failure: Secondary | ICD-10-CM | POA: Diagnosis not present

## 2019-08-29 DIAGNOSIS — M479 Spondylosis, unspecified: Secondary | ICD-10-CM | POA: Diagnosis not present

## 2019-08-29 DIAGNOSIS — Z8673 Personal history of transient ischemic attack (TIA), and cerebral infarction without residual deficits: Secondary | ICD-10-CM | POA: Diagnosis not present

## 2019-08-29 DIAGNOSIS — D631 Anemia in chronic kidney disease: Secondary | ICD-10-CM | POA: Diagnosis not present

## 2019-08-29 DIAGNOSIS — E785 Hyperlipidemia, unspecified: Secondary | ICD-10-CM | POA: Diagnosis not present

## 2019-08-29 DIAGNOSIS — M1712 Unilateral primary osteoarthritis, left knee: Secondary | ICD-10-CM | POA: Diagnosis not present

## 2019-08-29 DIAGNOSIS — N183 Chronic kidney disease, stage 3 (moderate): Secondary | ICD-10-CM | POA: Diagnosis not present

## 2019-08-29 DIAGNOSIS — M8580 Other specified disorders of bone density and structure, unspecified site: Secondary | ICD-10-CM | POA: Diagnosis not present

## 2019-08-29 DIAGNOSIS — Z9181 History of falling: Secondary | ICD-10-CM | POA: Diagnosis not present

## 2019-08-29 DIAGNOSIS — Z794 Long term (current) use of insulin: Secondary | ICD-10-CM | POA: Diagnosis not present

## 2019-08-29 DIAGNOSIS — E1122 Type 2 diabetes mellitus with diabetic chronic kidney disease: Secondary | ICD-10-CM | POA: Diagnosis not present

## 2019-08-29 DIAGNOSIS — I130 Hypertensive heart and chronic kidney disease with heart failure and stage 1 through stage 4 chronic kidney disease, or unspecified chronic kidney disease: Secondary | ICD-10-CM | POA: Diagnosis not present

## 2019-08-29 DIAGNOSIS — I252 Old myocardial infarction: Secondary | ICD-10-CM | POA: Diagnosis not present

## 2019-08-29 DIAGNOSIS — I6529 Occlusion and stenosis of unspecified carotid artery: Secondary | ICD-10-CM | POA: Diagnosis not present

## 2019-08-30 ENCOUNTER — Ambulatory Visit: Payer: Medicare Other | Admitting: Cardiology

## 2019-09-03 ENCOUNTER — Other Ambulatory Visit: Payer: Self-pay | Admitting: Family Medicine

## 2019-09-03 DIAGNOSIS — K219 Gastro-esophageal reflux disease without esophagitis: Secondary | ICD-10-CM

## 2019-09-05 ENCOUNTER — Other Ambulatory Visit: Payer: Self-pay

## 2019-09-05 ENCOUNTER — Emergency Department (HOSPITAL_COMMUNITY): Payer: Medicare Other

## 2019-09-05 ENCOUNTER — Encounter (HOSPITAL_COMMUNITY): Payer: Self-pay

## 2019-09-05 ENCOUNTER — Emergency Department (HOSPITAL_COMMUNITY)
Admission: EM | Admit: 2019-09-05 | Discharge: 2019-09-06 | Disposition: A | Discharge status: Home / Self Care | Payer: Medicare Other | Attending: Emergency Medicine | Admitting: Emergency Medicine

## 2019-09-05 DIAGNOSIS — I482 Chronic atrial fibrillation, unspecified: Secondary | ICD-10-CM | POA: Diagnosis not present

## 2019-09-05 DIAGNOSIS — W07XXXA Fall from chair, initial encounter: Secondary | ICD-10-CM | POA: Diagnosis not present

## 2019-09-05 DIAGNOSIS — R5381 Other malaise: Secondary | ICD-10-CM | POA: Diagnosis not present

## 2019-09-05 DIAGNOSIS — I110 Hypertensive heart disease with heart failure: Secondary | ICD-10-CM | POA: Diagnosis not present

## 2019-09-05 DIAGNOSIS — R4182 Altered mental status, unspecified: Secondary | ICD-10-CM | POA: Diagnosis present

## 2019-09-05 DIAGNOSIS — F0390 Unspecified dementia without behavioral disturbance: Secondary | ICD-10-CM | POA: Diagnosis not present

## 2019-09-05 DIAGNOSIS — W19XXXA Unspecified fall, initial encounter: Secondary | ICD-10-CM

## 2019-09-05 DIAGNOSIS — E1122 Type 2 diabetes mellitus with diabetic chronic kidney disease: Secondary | ICD-10-CM | POA: Insufficient documentation

## 2019-09-05 DIAGNOSIS — Y999 Unspecified external cause status: Secondary | ICD-10-CM | POA: Insufficient documentation

## 2019-09-05 DIAGNOSIS — Y9389 Activity, other specified: Secondary | ICD-10-CM | POA: Insufficient documentation

## 2019-09-05 DIAGNOSIS — I259 Chronic ischemic heart disease, unspecified: Secondary | ICD-10-CM | POA: Insufficient documentation

## 2019-09-05 DIAGNOSIS — Z951 Presence of aortocoronary bypass graft: Secondary | ICD-10-CM | POA: Insufficient documentation

## 2019-09-05 DIAGNOSIS — S299XXA Unspecified injury of thorax, initial encounter: Secondary | ICD-10-CM | POA: Diagnosis not present

## 2019-09-05 DIAGNOSIS — I252 Old myocardial infarction: Secondary | ICD-10-CM | POA: Diagnosis not present

## 2019-09-05 DIAGNOSIS — Z96651 Presence of right artificial knee joint: Secondary | ICD-10-CM | POA: Insufficient documentation

## 2019-09-05 DIAGNOSIS — N390 Urinary tract infection, site not specified: Secondary | ICD-10-CM | POA: Diagnosis not present

## 2019-09-05 DIAGNOSIS — J45909 Unspecified asthma, uncomplicated: Secondary | ICD-10-CM | POA: Insufficient documentation

## 2019-09-05 DIAGNOSIS — Z950 Presence of cardiac pacemaker: Secondary | ICD-10-CM | POA: Insufficient documentation

## 2019-09-05 DIAGNOSIS — I509 Heart failure, unspecified: Secondary | ICD-10-CM | POA: Diagnosis not present

## 2019-09-05 DIAGNOSIS — N189 Chronic kidney disease, unspecified: Secondary | ICD-10-CM | POA: Insufficient documentation

## 2019-09-05 DIAGNOSIS — S0990XA Unspecified injury of head, initial encounter: Secondary | ICD-10-CM | POA: Diagnosis not present

## 2019-09-05 DIAGNOSIS — Y929 Unspecified place or not applicable: Secondary | ICD-10-CM | POA: Diagnosis not present

## 2019-09-05 DIAGNOSIS — R410 Disorientation, unspecified: Secondary | ICD-10-CM | POA: Diagnosis not present

## 2019-09-05 LAB — BASIC METABOLIC PANEL
Anion gap: 9 (ref 5–15)
BUN: 49 mg/dL — ABNORMAL HIGH (ref 8–23)
CO2: 24 mmol/L (ref 22–32)
Calcium: 9 mg/dL (ref 8.9–10.3)
Chloride: 105 mmol/L (ref 98–111)
Creatinine, Ser: 1.89 mg/dL — ABNORMAL HIGH (ref 0.44–1.00)
GFR calc Af Amer: 28 mL/min — ABNORMAL LOW (ref >60)
GFR calc non Af Amer: 24 mL/min — ABNORMAL LOW (ref >60)
Glucose, Bld: 162 mg/dL — ABNORMAL HIGH (ref 70–99)
Potassium: 4 mmol/L (ref 3.5–5.1)
Sodium: 138 mmol/L (ref 135–145)

## 2019-09-05 LAB — CBC
HCT: 34.2 % — ABNORMAL LOW (ref 36.0–46.0)
Hemoglobin: 10.1 g/dL — ABNORMAL LOW (ref 12.0–15.0)
MCH: 28.2 pg (ref 26.0–34.0)
MCHC: 29.5 g/dL — ABNORMAL LOW (ref 30.0–36.0)
MCV: 95.5 fL (ref 80.0–100.0)
Platelets: 198 10*3/uL (ref 150–400)
RBC: 3.58 MIL/uL — ABNORMAL LOW (ref 3.87–5.11)
RDW: 15.3 % (ref 11.5–15.5)
WBC: 6.5 10*3/uL (ref 4.0–10.5)
nRBC: 0 % (ref 0.0–0.2)

## 2019-09-05 LAB — TROPONIN I (HIGH SENSITIVITY)
Troponin I (High Sensitivity): 13 ng/L (ref <18)
Troponin I (High Sensitivity): 15 ng/L (ref <18)

## 2019-09-05 LAB — BRAIN NATRIURETIC PEPTIDE: B Natriuretic Peptide: 1201 pg/mL — ABNORMAL HIGH (ref 0.0–100.0)

## 2019-09-05 NOTE — ED Notes (Addendum)
PT HAS [employer] DUAL CHAMBER PACEMAKER PLACED  07/24/2019

## 2019-09-05 NOTE — ED Triage Notes (Signed)
Pt has been acting very sluggish and lethargic for the last 2 days. Had a pacemaker placed 6 weeks ago. Pt was getting ready to eat dinner today and fell and hit her head on the chair.

## 2019-09-05 NOTE — Patient Outreach (Signed)
[facility]) Care Management  09/05/2019  [name] July 18, 1931 [id number]   Medication Adherence call to [name] [place] Identifiers Verify spoke with patient daughter patient is past due on Lisinopril 20 mg daughter explain she has stop the medication because she thinks it has many side effects patient has and upcoming appointment and will go over it with her doctor. [name] is showing past due under [place].   [name] Management Direct Dial [phone]  Fax [phone] [name]

## 2019-09-05 NOTE — ED Notes (Signed)
Pacemaker and they said she has been in an atrial arrythmia since 8/17.

## 2019-09-05 NOTE — ED Provider Notes (Signed)
[facility] Emergency Department Provider Note MRN:  [id number]  Arrival date & time: 09/05/19     Chief Complaint   Altered Mental Status   History of Present Illness   NATASH BERMAN is a 85 y.o. year-old female with a history of CAD status post CABG, tachybradycardia syndrome status post pacemaker, stroke, diabetes presenting to the ED with chief complaint of altered mental status.  Patient has been experiencing weakness and fatigue for 2 days.  Had a mechanical fall today while trying to get out of a chair.  [address] and hit her head.  Patient takes anticoagulation.  Acting a bit confused, but family unsure if the confusion started before or after the fall.  Denies neck pain, no chest pain, no shortness of breath, no abdominal pain, family has noted some increased swelling to the legs for the past few days.  Review of Systems  A complete 10 system review of systems was obtained and all systems are negative except as noted in the HPI and PMH.   Patient's Health History    Past Medical History:  Diagnosis Date  . Anemia   . Anxiety   . Aortic stenosis   . Arthritis    back, knees, and hips  . Asthma    with allergies  . Balance problems   . CAD (coronary artery disease)   . Carotid stenosis   . CHF (congestive heart failure) ([place])    EF preserved Echo 2012  . CKD (chronic kidney disease)   . 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 Dementia ([place])    "a little"  . Depression   . Diabetes mellitus    type 2  . GERD (gastroesophageal reflux disease)   . Hypertension   . Iron deficiency anemia 05/27/2011  . Myocardial infarction ([place])   . Renal insufficiency   . Scoliosis   . Vertigo    "when sugar gets low"  . Vision loss    left eye-"half blind"    Past Surgical History:  Procedure Laterality Date  . APPENDECTOMY     with hysterectomy  . CATARACT EXTRACTION  Bilateral 5 years ago  . CHOLECYSTECTOMY    . COLONOSCOPY  2005   Dr. Amedeo Plenty: Diverticulosis  . COLONOSCOPY WITH PROPOFOL N/A 02/14/2019   Procedure: COLONOSCOPY WITH PROPOFOL;  Surgeon: Danie Binder, MD;  Location: [facility];  Service: Endoscopy;  Laterality: N/A;  . CORONARY ANGIOPLASTY     prior to 2006 5 stents  . CORONARY ARTERY BYPASS GRAFT  2006  . I & D of [place]  April 2013  . KNEE SURGERY Right   . PACEMAKER IMPLANT N/A 07/24/2019   Procedure: PACEMAKER IMPLANT;  Surgeon: Evans Lance, MD;  Location: [facility] LAB;  Service: Cardiovascular;  Laterality: N/A;  . PACEMAKER INSERTION    . POLYPECTOMY  02/14/2019   Procedure: POLYPECTOMY;  Surgeon: Danie Binder, MD;  Location: [facility];  Service: Endoscopy;;  cecal polyp  . TOTAL ABDOMINAL HYSTERECTOMY  ~85 years old   complete, with tumor removal  . TOTAL KNEE ARTHROPLASTY Right 08/13/2015   Procedure: RIGHT  TOTAL KNEE ARTHROPLASTY;  Surgeon: Paralee Cancel, MD;  Location: [facility];  Service: Orthopedics;  Laterality: Right;    Family History  Problem Relation Age of Onset  . Cancer Brother        porstate  . Early death  Sister   . Heart disease Brother   . Heart disease Brother   . Heart attack Brother   . Heart disease Brother   . Heart attack Brother   . Diabetes Sister   . Heart attack Sister   . Diabetes Sister   . Osteoporosis Sister   . Hypertension Sister   . Heart attack Son   . Early death Son   . Pancreatitis Son   . Heart attack Daughter 71    Social History   Socioeconomic History  . Marital status: Widowed    Spouse name: Not on file  . Number of children: 4  . Years of education: 3  . Highest education level: 3rd grade  Occupational History  . Occupation: retired  Scientific laboratory technician  . Financial resource strain: Not very hard  . Food insecurity    Worry: Never true    Inability: Never true  . Transportation needs    Medical: No    Non-medical: No  Tobacco Use  . Smoking  status: Never Smoker  . Smokeless tobacco: Never Used  Substance and Sexual Activity  . Alcohol use: No  . Drug use: No  . Sexual activity: Not Currently  Lifestyle  . Physical activity    Days per week: 0 days    Minutes per session: 0 min  . Stress: To some extent  Relationships  . Social connections    Talks on phone: More than three times a week    Gets together: More than three times a week    Attends religious service: More than 4 times per year    Active member of club or organization: No    Attends meetings of clubs or organizations: Never    Relationship status: Widowed  . Intimate partner violence    Fear of current or ex partner: No    Emotionally abused: No    Physically abused: No    Forced sexual activity: No  Other Topics Concern  . Not on file  Social History Narrative  . Not on file     Physical Exam  Vital Signs and Nursing Notes reviewed Vitals:   09/05/19 1744 09/05/19 2206  BP: (!) 156/121 (!) 149/99  Pulse: (!) 102 (!) 133  Resp:  18  Temp: 98.5 F (36.9 C) 97.8 F (36.6 C)  SpO2: 98% 100%    CONSTITUTIONAL: Chronically ill-appearing, NAD NEURO: Awake, alert, oriented to name only, no focal neurological deficits EYES:  eyes equal and reactive ENT/NECK:  no LAD, no JVD CARDIO: Tachycardic rate, well-perfused, normal S1 and S2 PULM:  CTAB no wheezing or rhonchi GI/GU:  normal bowel sounds, non-distended, non-tender MSK/SPINE:  No gross deformities, 2+ pitting edema to bilateral lower extremities SKIN:  no rash, atraumatic PSYCH:  Appropriate speech and behavior  Diagnostic and Interventional Summary    EKG Interpretation  Date/Time:  Tuesday September 05 2019 22:07:11 EDT Ventricular Rate:  95 PR Interval:    QRS Duration: 138 QT Interval:  394 QTC Calculation: 495 R Axis:   131 Text Interpretation:  Atrial fibrillation Right bundle branch block Left posterior fascicular block Abnormal ECG Confirmed by Gerlene Fee [phone]) on  09/05/2019 10:56:02 PM      Labs Reviewed  CBC - Abnormal; Notable for the following components:      Result Value   RBC 3.58 (*)    Hemoglobin 10.1 (*)    HCT 34.2 (*)    MCHC 29.5 (*)    All other components within  normal limits  BASIC METABOLIC PANEL - Abnormal; Notable for the following components:   Glucose, Bld 162 (*)    BUN 49 (*)    Creatinine, Ser 1.89 (*)    GFR calc non Af Amer 24 (*)    GFR calc Af Amer 28 (*)    All other components within normal limits  BRAIN NATRIURETIC PEPTIDE  URINALYSIS, ROUTINE W REFLEX MICROSCOPIC  TROPONIN I (HIGH SENSITIVITY)  TROPONIN I (HIGH SENSITIVITY)    CT HEAD WO CONTRAST  Final Result    DG Chest Port 1 View    (Results Pending)    Medications - No data to display   Procedures Critical Care  ED Course and Medical Decision Making  I have reviewed the triage vital signs and the nursing notes.  Pertinent labs & imaging results that were available during my care of the patient were reviewed by me and considered in my medical decision making (see below for details).  Considering the intracranial hemorrhage from trauma, however CT head is reassuring.  Considering metabolic disarray, UTI.  Also considering CHF exacerbation.  Patient is exhibiting some tachycardia here in the emergency department, could be symptomatic again from tachyarrhythmia, EKG is pending.  We will also interrogate the pacemaker.  Will evaluate patient's oxygenation when ambulating.  Work-up is pending, signed out to oncoming provider at shift change.  Barth Kirks. Sedonia Small, [place] [email or web address]  Final Clinical Impressions(s) / ED Diagnoses     ICD-10-CM   1. Malaise  R53.81   2. Fall  W19.Merril Abbe DG Chest Port 1 View    DG Chest Port 1 View  3. Confusion  R41.0     ED Discharge Orders    None      Discharge Instructions Discussed with and Provided to Patient: Discharge Instructions   None        Maudie Flakes, MD 09/05/19 2256

## 2019-09-06 ENCOUNTER — Ambulatory Visit (INDEPENDENT_AMBULATORY_CARE_PROVIDER_SITE_OTHER): Payer: Medicare Other | Admitting: Family Medicine

## 2019-09-06 ENCOUNTER — Encounter: Payer: Self-pay | Admitting: Family Medicine

## 2019-09-06 ENCOUNTER — Telehealth: Payer: Self-pay | Admitting: Family Medicine

## 2019-09-06 DIAGNOSIS — I5033 Acute on chronic diastolic (congestive) heart failure: Secondary | ICD-10-CM

## 2019-09-06 DIAGNOSIS — R0602 Shortness of breath: Secondary | ICD-10-CM

## 2019-09-06 DIAGNOSIS — R399 Unspecified symptoms and signs involving the genitourinary system: Secondary | ICD-10-CM | POA: Diagnosis not present

## 2019-09-06 DIAGNOSIS — R5381 Other malaise: Secondary | ICD-10-CM | POA: Diagnosis not present

## 2019-09-06 LAB — URINALYSIS, ROUTINE W REFLEX MICROSCOPIC
Bilirubin Urine: NEGATIVE
Glucose, UA: NEGATIVE mg/dL
Ketones, ur: NEGATIVE mg/dL
Nitrite: NEGATIVE
Protein, ur: 30 mg/dL — AB
Specific Gravity, Urine: 1.014 (ref 1.005–1.030)
pH: 5 (ref 5.0–8.0)

## 2019-09-06 MED ORDER — RAMIPRIL 5 MG PO CAPS: 5.0000 mg | ORAL_CAPSULE | Freq: Every day | ORAL | 3 refills | Status: AC

## 2019-09-06 MED ORDER — CEPHALEXIN 500 MG PO CAPS: 500.0000 mg | ORAL_CAPSULE | Freq: Three times a day (TID) | ORAL | 0 refills | Status: DC

## 2019-09-06 MED ORDER — FUROSEMIDE 20 MG PO TABS: 40.0000 mg | ORAL_TABLET | Freq: Every morning | ORAL | Status: DC

## 2019-09-06 MED ORDER — CEPHALEXIN 500 MG PO CAPS
500.0000 mg | ORAL_CAPSULE | Freq: Once | ORAL | Status: AC
  Administered 2019-09-06: 03:00:00 500 mg via ORAL
  Filled 2019-09-06: qty 1

## 2019-09-06 MED ORDER — SODIUM CHLORIDE 0.9 % IV SOLN
1.0000 g | Freq: Once | INTRAVENOUS | Status: DC
  Filled 2019-09-06: qty 10

## 2019-09-06 NOTE — Discharge Instructions (Addendum)
For the next 3-4 mornings double her Lasix pill in the morning so she takes 40 mg daily.  Give her the antibiotic until gone.  Return to the emergency department if you feel like you can no longer take care of her at home or if she seems worse such as high fever, vomiting, or struggling to breathe.

## 2019-09-06 NOTE — Progress Notes (Signed)
Subjective:    [name], female    DOB: 1931/11/27, 85 y.o.   MRN: [id number]   HPI: [name] is a 85 y.o. female presenting for falling yesterday. Checked in E.D. PAcemaker not damaged. No fractures. She has a little kidney infection. Given an antibiotic, keflex. CXR showed CHF. Told to double up on fluid pill for a couple of days. Dyspnea is at baseline. Had some edema in her legs after eating some chicken soup a few days ago.   Depression screen [facility] 2/9 07/03/2019 04/28/2019 02/03/2019 01/20/2019 01/03/2019  Decreased Interest 0 0 1 1 1   Down, Depressed, Hopeless 0 1 1 1 1   PHQ - 2 Score 0 1 2 2 2   Altered sleeping - - 1 1 1   Tired, decreased energy - - 1 1 1   Change in appetite - - 0 0 0  Feeling bad or failure about yourself  - - 0 0 0  Trouble concentrating - - 1 1 0  Moving slowly or fidgety/restless - - 1 1 0  Suicidal thoughts - - 0 0 0  PHQ-9 Score - - 6 6 4   Difficult doing work/chores - - - - -  Some recent data might be hidden     Relevant past medical, surgical, family and social history reviewed and updated as indicated.  Interim medical history since our last visit reviewed. Allergies and medications reviewed and updated.  ROS:  Review of Systems  Constitutional: Negative.   HENT: Negative for congestion.   Eyes: Negative for visual disturbance.  Respiratory: Positive for shortness of breath.   Cardiovascular: Positive for leg swelling. Negative for chest pain.  Gastrointestinal: Negative for abdominal pain, constipation, diarrhea, nausea and vomiting.  Endocrine: Positive for polyphagia.  Genitourinary: Positive for dysuria and flank pain. Negative for difficulty urinating.  Musculoskeletal: Negative for arthralgias and myalgias.  Neurological: Negative for headaches.  Psychiatric/Behavioral: Negative for sleep disturbance.     Social History   Tobacco Use  Smoking Status Never Smoker  Smokeless Tobacco Never Used        Objective:     Wt Readings from Last 3 Encounters:  09/05/19 150 lb (68 kg)  08/23/19 150 lb (68 kg)  08/06/19 147 lb (66.7 kg)     Exam deferred. Pt. Harboring due to COVID 19. Phone visit performed.   Assessment & Plan:   1. Acute on chronic diastolic CHF (congestive heart failure) ([place])   2. Physical deconditioning   3. UTI symptoms   4. SOB (shortness of breath)     Meds ordered this encounter  Medications  . furosemide (LASIX) 20 MG tablet    Sig: Take 2 tablets (40 mg total) by mouth every morning.    Dispense:  30 tablet  . ramipril (ALTACE) 5 MG capsule    Sig: Take 1 capsule (5 mg total) by mouth daily.    Dispense:  90 capsule    Refill:  3    No orders of the defined types were placed in this encounter.     Diagnoses and all orders for this visit:  Acute on chronic diastolic CHF (congestive heart failure) ([facility])  Physical deconditioning  UTI symptoms  SOB (shortness of breath)  Other orders -     furosemide (LASIX) 20 MG tablet; Take 2 tablets (40 mg total) by mouth every morning. -     ramipril (ALTACE) 5 MG capsule; Take 1 capsule (5 mg total) by mouth daily.  Virtual Visit via telephone Note  I discussed the limitations, risks, security and privacy concerns of performing an evaluation and management service by telephone and the availability of in person appointments. The patient was identified with two identifiers. Pt.expressed understanding and agreed to proceed. Pt. Is at home. Dr. Livia Snellen is in his office.  Follow Up Instructions:   I discussed the assessment and treatment plan with the patient. The patient was provided an opportunity to ask questions and all were answered. The patient agreed with the plan and demonstrated an understanding of the instructions.   The patient was advised to call back or seek an in-person evaluation if the symptoms worsen or if the condition fails to improve as anticipated.   Total minutes including chart  review and phone contact time: 18   Follow up plan: Return in about 1 week (around 09/13/2019) for needs to be in peson in office.[name], MD [place]

## 2019-09-06 NOTE — Telephone Encounter (Signed)
I have an acute day on Friday Oct. 9. Put her in for that day. Thanks, WS

## 2019-09-06 NOTE — ED Notes (Signed)
edp to room  

## 2019-09-06 NOTE — ED Notes (Signed)
Ambulate pt with oximeter to BR. Pt o2 did not drop below 94%. Pt stated out of breath

## 2019-09-06 NOTE — ED Provider Notes (Signed)
Patient was left at change of shift to check on her urine and her pacemaker interrogation.  The [employer] pacemaker report was that she had been in atrial fibrillation since it was inserted.  There were no paced terminated episodes.  Her average ventricular rate was described as being over 100 bpm for over 6 hours a day for the last 21 days.  Her night heart rate was over 85 bpm for 7 nights.  Patient's heart rate when I look at the monitor has been anywhere from 100 up to 105.  When I go in the room she does not appear to be in respiratory distress, she is laying flat in the stretcher.  Her daughter is at bedside and states that she lives with the patient.  She states this evening the patient was able to walk up and down the steps to her house with assistance which is usual.  She uses a walker.  She feels comfortable taking her home.  She has noted some increased swelling of her legs and her BNP and chest x-ray do look like she has some failure.  We discussed increasing her fluid pills for a few days.  She states her primary care doctor is going to be calling them at 1030 this morning for an virtual appointment.  Nursing staff has ambulated patient and her pulse ox did not drop below 94%.  A urine culture was sent and she was started on oral Keflex.  She had a urine culture done on July 31 which showed more than 100,000 colonies E. coli that was sensitive to all antibiotics tested.  Results for orders placed or performed during the hospital encounter of 09/05/19  CBC  Result Value Ref Range   WBC 6.5 4.0 - 10.5 K/uL   RBC 3.58 (L) 3.87 - 5.11 MIL/uL   Hemoglobin 10.1 (L) 12.0 - 15.0 g/dL   HCT 34.2 (L) 36.0 - 46.0 %   MCV 95.5 80.0 - 100.0 fL   MCH 28.2 26.0 - 34.0 pg   MCHC 29.5 (L) 30.0 - 36.0 g/dL   RDW 15.3 11.5 - 15.5 %   Platelets 198 150 - 400 K/uL   nRBC 0.0 0.0 - 0.2 %  Basic metabolic panel  Result Value Ref Range   Sodium 138 135 - 145 mmol/L   Potassium 4.0 3.5 - 5.1 mmol/L   Chloride 105 98 - 111 mmol/L   CO2 24 22 - 32 mmol/L   Glucose, Bld 162 (H) 70 - 99 mg/dL   BUN 49 (H) 8 - 23 mg/dL   Creatinine, Ser 1.89 (H) 0.44 - 1.00 mg/dL   Calcium 9.0 8.9 - 10.3 mg/dL   GFR calc non Af Amer 24 (L) >60 mL/min   GFR calc Af Amer 28 (L) >60 mL/min   Anion gap 9 5 - 15  Brain natriuretic peptide  Result Value Ref Range   B Natriuretic Peptide 1,201.0 (H) 0.0 - 100.0 pg/mL  Urinalysis, Routine w reflex microscopic  Result Value Ref Range   Color, Urine YELLOW YELLOW   APPearance HAZY (A) CLEAR   Specific Gravity, Urine 1.014 1.005 - 1.030   pH 5.0 5.0 - 8.0   Glucose, UA NEGATIVE NEGATIVE mg/dL   Hgb urine dipstick SMALL (A) NEGATIVE   Bilirubin Urine NEGATIVE NEGATIVE   Ketones, ur NEGATIVE NEGATIVE mg/dL   Protein, ur 30 (A) NEGATIVE mg/dL   Nitrite NEGATIVE NEGATIVE   Leukocytes,Ua SMALL (A) NEGATIVE   RBC / HPF 0-5 0 - 5  RBC/hpf   WBC, UA 6-10 0 - 5 WBC/hpf   Bacteria, UA RARE (A) NONE SEEN   Squamous Epithelial / LPF 0-5 0 - 5   WBC Clumps PRESENT    Hyaline Casts, UA PRESENT   Troponin I (High Sensitivity)  Result Value Ref Range   Troponin I (High Sensitivity) 13 <18 ng/L  Troponin I (High Sensitivity)  Result Value Ref Range   Troponin I (High Sensitivity) 15 <18 ng/L   Ct Head Wo Contrast  Result Date: 09/05/2019 CLINICAL DATA:  Fall on anticoagulation EXAM: CT HEAD WITHOUT CONTRAST TECHNIQUE: Contiguous axial images were obtained from the base of the skull through the vertex without intravenous contrast. COMPARISON:  CT head 06/29/2019 FINDINGS: Brain: Stable regions of gliosis in the right occipital lobe and bilateral cerebellar hemispheres compatible with remote infarcts. No evidence of acute infarction, hemorrhage, hydrocephalus, extra-axial collection or mass lesion/mass effect. Symmetric prominence of the ventricles, cisterns and sulci compatible with parenchymal volume loss. Patchy areas of white matter hypoattenuation are most compatible  with chronic microvascular angiopathy. Vascular: Atherosclerotic calcification of the carotid siphons and intradural vertebral arteries. No hyperdense vessel. Skull: No calvarial fracture or suspicious osseous lesion. No scalp swelling or hematoma. Sinuses/Orbits: Mural sinus disease noted in the left maxillary and ethmoid sinuses. Orbital structures are unremarkable aside from prior lens extractions. Other: None IMPRESSION: 1. No acute intracranial abnormality. 2. 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 3. Chronic microvascular changes and moderate parenchymal volume loss, similar to prior. Electronically Signed   By: Lovena Le M.D.   On: 09/05/2019 19:18   Dg Chest Port 1 View  Result Date: 09/05/2019 CLINICAL DATA:  Fall.  Lethargy.  Pacemaker placed 6 weeks ago. EXAM: PORTABLE CHEST 1 VIEW COMPARISON:  Radiograph 07/25/2019 FINDINGS: Left-sided pacemaker in place. Post median sternotomy. Unchanged cardiomegaly. Unchanged aortic tortuosity and atherosclerosis. Progressive pulmonary edema from prior exam. Increased pleural effusions, right greater than left. No pneumothorax. No acute osseous abnormalities are seen. Chronic change about the left shoulder. IMPRESSION: 1. Progressive pulmonary edema and increasing pleural effusions, right greater than left. Findings consistent with CHF. 2. Stable cardiomegaly. 3. Aortic tortuosity and atherosclerosis. Aortic Atherosclerosis (ICD10-I70.0). Electronically Signed   By: Keith Rake M.D.   On: 09/05/2019 23:36       Diagnoses that have been ruled out:  None  Diagnoses that are still under consideration:  None  Final diagnoses:  Fall  Malaise  Confusion  Urinary tract infection without hematuria, site unspecified  Congestive heart failure, unspecified HF chronicity, unspecified heart failure type [facility])  Chronic atrial fibrillation [facility])   ED Discharge Orders         Ordered    cephALEXin (KEFLEX) 500 MG  capsule  3 times daily     09/06/19 0250           Plan discharge  Rolland Porter, MD, Barbette Or, MD 09/06/19 [phone]

## 2019-09-06 NOTE — Telephone Encounter (Signed)
Appt made

## 2019-09-08 LAB — URINE CULTURE
Culture: >=100000 — AB
Special Requests: NORMAL

## 2019-09-09 ENCOUNTER — Telehealth: Payer: Self-pay | Admitting: Emergency Medicine

## 2019-09-09 NOTE — Telephone Encounter (Signed)
Post ED Visit - Positive Culture Follow-up  Culture report reviewed by antimicrobial stewardship pharmacist: [place] Team []  Elenor Quinones, Pharm.D. []  Heide Guile, Pharm.D., BCPS AQ-ID []  Parks Neptune, Pharm.D., BCPS []  Alycia Rossetti, Pharm.D., BCPS []  [place], Pharm.D., BCPS, AAHIVP []  Legrand Como, Pharm.D., BCPS, AAHIVP []  Salome Arnt, PharmD, BCPS []  Johnnette Gourd, PharmD, BCPS [x]  Hughes Better, PharmD, BCPS []  Leeroy Cha, PharmD []  Laqueta Linden, PharmD, BCPS []  Albertina Parr, PharmD  [place] Team []  Leodis Sias, PharmD []  Lindell Spar, PharmD []  Royetta Asal, PharmD []  Graylin Shiver, Rph []  Rema Fendt) Glennon Mac, PharmD []  Arlyn Dunning, PharmD []  Netta Cedars, PharmD []  Dia Sitter, PharmD []  Leone Haven, PharmD []  Gretta Arab, PharmD []  Theodis Shove, PharmD []  Peggyann Juba, PharmD []  Reuel Boom, PharmD   Positive urine culture Treated with Cephalexin, organism sensitive to the same and no further patient follow-up is required at this time.  Sandi Raveling Amabel Stmarie 09/09/2019, 5:28 PM

## 2019-09-14 ENCOUNTER — Other Ambulatory Visit: Payer: Self-pay

## 2019-09-15 ENCOUNTER — Ambulatory Visit (HOSPITAL_COMMUNITY)
Admission: RE | Admit: 2019-09-15 | Discharge: 2019-09-15 | Disposition: A | Discharge status: Home / Self Care | Payer: Medicare Other | Source: Ambulatory Visit | Attending: Family Medicine | Admitting: Family Medicine

## 2019-09-15 ENCOUNTER — Ambulatory Visit (INDEPENDENT_AMBULATORY_CARE_PROVIDER_SITE_OTHER): Payer: Medicare Other

## 2019-09-15 ENCOUNTER — Encounter: Payer: Self-pay | Admitting: Family Medicine

## 2019-09-15 ENCOUNTER — Ambulatory Visit (INDEPENDENT_AMBULATORY_CARE_PROVIDER_SITE_OTHER): Payer: Medicare Other | Admitting: Family Medicine

## 2019-09-15 ENCOUNTER — Other Ambulatory Visit: Payer: Self-pay

## 2019-09-15 ENCOUNTER — Other Ambulatory Visit: Payer: Self-pay | Admitting: Family Medicine

## 2019-09-15 VITALS — BP 133/90 | HR 112 | Temp 96.6°F | Resp 16 | Ht 66.0 in | Wt 156.0 lb

## 2019-09-15 DIAGNOSIS — R0602 Shortness of breath: Secondary | ICD-10-CM | POA: Diagnosis not present

## 2019-09-15 DIAGNOSIS — R110 Nausea: Secondary | ICD-10-CM

## 2019-09-15 DIAGNOSIS — J90 Pleural effusion, not elsewhere classified: Secondary | ICD-10-CM

## 2019-09-15 DIAGNOSIS — I509 Heart failure, unspecified: Secondary | ICD-10-CM | POA: Diagnosis not present

## 2019-09-15 DIAGNOSIS — N1832 Chronic kidney disease, stage 3b: Secondary | ICD-10-CM | POA: Diagnosis not present

## 2019-09-15 DIAGNOSIS — R601 Generalized edema: Secondary | ICD-10-CM | POA: Diagnosis not present

## 2019-09-15 DIAGNOSIS — R918 Other nonspecific abnormal finding of lung field: Secondary | ICD-10-CM

## 2019-09-15 MED ORDER — METOPROLOL SUCCINATE ER 100 MG PO TB24: 100.0000 mg | ORAL_TABLET | Freq: Every day | ORAL | 1 refills | Status: AC

## 2019-09-15 MED ORDER — LISINOPRIL 10 MG PO TABS: 10.0000 mg | ORAL_TABLET | Freq: Every day | ORAL | 3 refills | Status: DC

## 2019-09-15 NOTE — Patient Instructions (Signed)
Taper off of hydralazine by decreasing to one morning and evening for three days. Then take one each evening for three days then discontinue the medication. Let me know if the nausea continues.

## 2019-09-15 NOTE — Progress Notes (Signed)
Subjective:  [name], female    DOB: [birth year]  Age: 85 y.o. MRN: [id number]  CC: 1 week recheck   HPI Terryl G Kierstead presents for severe nausea with each dose of hydralazine. Awake all the time moaning. More swelling as well.  Review of recent ech shows preservation of L&R ventricular function.   Depression screen [facility] 2/9 09/15/2019 07/03/2019 04/28/2019  Decreased Interest 0 0 0  Down, Depressed, Hopeless 0 0 1  PHQ - 2 Score 0 0 1  Altered sleeping - - -  Tired, decreased energy - - -  Change in appetite - - -  Feeling bad or failure about yourself  - - -  Trouble concentrating - - -  Moving slowly or fidgety/restless - - -  Suicidal thoughts - - -  PHQ-9 Score - - -  Difficult doing work/chores - - -  Some recent data might be hidden    History Lorrie has a past medical history of Anemia, Anxiety, Aortic stenosis, Arthritis, Asthma, Balance problems, CAD (coronary artery disease), Carotid stenosis, CHF (congestive heart failure) ([place]), CKD (chronic kidney disease), CVA (cerebral infarction), Dementia ([place]), Depression, Diabetes mellitus, GERD (gastroesophageal reflux disease), Hypertension, Iron deficiency anemia (05/27/2011), Myocardial infarction [facility]), Renal insufficiency, Scoliosis, Vertigo, and Vision loss.   She has a past surgical history that includes I & D of [place] (April 2013); Coronary artery bypass graft (2006); Coronary angioplasty; Total abdominal hysterectomy (~85 years old); Cholecystectomy; Appendectomy; Cataract extraction (Bilateral, 5 years ago); Total knee arthroplasty (Right, 08/13/2015); Knee surgery (Right); Colonoscopy (2005); Colonoscopy with propofol (N/A, 02/14/2019); polypectomy (02/14/2019); PACEMAKER IMPLANT (N/A, 07/24/2019); and Pacemaker insertion.   Her family history includes Cancer in her brother; Diabetes in her sister and sister; Early death in her sister and son; Heart attack in her brother, brother, sister, and son; Heart  attack (age of onset: 4) in her daughter; Heart disease in her brother, brother, and brother; Hypertension in her sister; Osteoporosis in her sister; Pancreatitis in her son.She reports that she has never smoked. She has never used smokeless tobacco. She reports that she does not drink alcohol or use drugs.    ROS Review of Systems  Objective:  BP 133/90   Pulse (!) 112   Temp (!) 96.6 F (35.9 C) (Temporal)   Resp 16   Ht 5' 6"  (1.676 m)   Wt 156 lb (70.8 kg)   SpO2 98%   BMI 25.18 kg/m   BP Readings from Last 3 Encounters:  09/15/19 133/90  09/06/19 134/79  08/25/19 (!) 156/105    Wt Readings from Last 3 Encounters:  09/15/19 156 lb (70.8 kg)  09/05/19 150 lb (68 kg)  08/23/19 150 lb (68 kg)     Physical Exam Constitutional:      General: She is not in acute distress.    Appearance: She is well-developed.  HENT:     Head: Normocephalic and atraumatic.  Eyes:     Conjunctiva/sclera: Conjunctivae normal.     Pupils: Pupils are equal, round, and reactive to light.  Neck:     Musculoskeletal: Normal range of motion and neck supple.     Thyroid: No thyromegaly.  Cardiovascular:     Rate and Rhythm: Normal rate and regular rhythm.     Heart sounds: Normal heart sounds. No murmur.  Pulmonary:     Effort: Pulmonary effort is normal. No respiratory distress.     Breath sounds: No wheezing or rales.     Comments:  Sounds decreased at right base  Abdominal:     General: Bowel sounds are normal. There is no distension.     Palpations: Abdomen is soft.     Tenderness: There is no abdominal tenderness.  Musculoskeletal: Normal range of motion.  Lymphadenopathy:     Cervical: No cervical adenopathy.  Skin:    General: Skin is warm and dry.  Neurological:     Mental Status: She is alert and oriented to person, place, and time.  Psychiatric:        Behavior: Behavior normal.        Thought Content: Thought content normal.        Judgment: Judgment normal.      chest X-ray Increase of R pleural effusion Preliminary reading done by Randell Loop   Assessment & Plan:   Zanaiya was seen today for 1 week recheck.  Diagnoses and all orders for this visit:  Generalized edema -     Brain natriuretic peptide -     CMP14+EGFR -     CBC with Differential/Platelet -     DG Chest 2 View; Future  Acute on chronic congestive heart failure, unspecified heart failure type ([place]) -     Brain natriuretic peptide -     CMP14+EGFR -     CBC with Differential/Platelet -     DG Chest 2 View; Future  Stage 3b chronic kidney disease -     Brain natriuretic peptide -     CMP14+EGFR -     CBC with Differential/Platelet -     DG Chest 2 View; Future  Nausea  Pleural effusion, not elsewhere classified -     CT Chest Wo Contrast; Future  Shortness of breath -     CT Chest Wo Contrast; Future  Other orders -     metoprolol succinate (TOPROL-XL) 100 MG 24 hr tablet; Take 1 tablet (100 mg total) by mouth daily. For blood pressure control -     lisinopril (ZESTRIL) 10 MG tablet; Take 1 tablet (10 mg total) by mouth daily.       I have discontinued Luverne G. Kassim's cephALEXin. I am also having her start on metoprolol succinate and lisinopril. Additionally, I am having her maintain her Multiple Vitamins-Iron (DAILY VITAMINS/IRON/BETA CAROT PO), insulin lispro, polyethylene glycol, atorvastatin, ferrous sulfate, albuterol, Levemir FlexTouch, PARoxetine, metFORMIN, Clear Eyes for Dry Eyes, magnesium hydroxide, methenamine, metoprolol tartrate, apixaban, hydrALAZINE, nitroGLYCERIN, esomeprazole, furosemide, and ramipril.  Allergies as of 09/15/2019      Reactions   Advil [ibuprofen] Swelling   Heparin Other (See Comments)   Confusion, hallucinations.    Propoxyphene N-acetaminophen Other (See Comments)   Numbness all over. "floating" sensation.      Medication List       Accurate as of September 15, 2019  9:15 AM. If you have any questions, ask your  nurse or doctor.        STOP taking these medications   cephALEXin 500 MG capsule Commonly known as: KEFLEX Stopped by: [name], MD     TAKE these medications   albuterol 108 (90 Base) MCG/ACT inhaler Commonly known as: VENTOLIN HFA Inhale 2 puffs into the lungs every 6 (six) hours as needed for wheezing or shortness of breath.   apixaban 2.5 MG Tabs tablet Commonly known as: ELIQUIS Take 1 tablet (2.5 mg total) by mouth 2 (two) times daily.   atorvastatin 20 MG tablet Commonly known as: LIPITOR Take 1 tablet (20 mg total) by mouth  at bedtime.   Clear Eyes for Dry Eyes 1-0.25 % Soln Generic drug: Carboxymethylcellul-Glycerin Place 1 drop into both eyes daily as needed.   DAILY VITAMINS/IRON/BETA CAROT PO Take 1 tablet by mouth at bedtime.   esomeprazole 40 MG capsule Commonly known as: NEXIUM TAKE 1 CAPSULE BY MOUTH ONCE DAILY. What changed: when to take this   ferrous sulfate 324 (65 Fe) MG Tbec Take 324 mg by mouth every morning.   furosemide 20 MG tablet Commonly known as: LASIX Take 2 tablets (40 mg total) by mouth every morning.   hydrALAZINE 25 MG tablet Commonly known as: APRESOLINE Take 1 tablet (25 mg total) by mouth 3 (three) times daily.   insulin lispro 100 UNIT/ML KiwkPen Commonly known as: HumaLOG KwikPen Inject 0.04-0.1 mLs (4-10 Units total) into the skin daily as needed (high blood sugar). What changed:   how much to take  reasons to take this   Levemir FlexTouch 100 UNIT/ML Pen Generic drug: Insulin Detemir INJECT 24 UNITS EACH MORNING What changed: See the new instructions.   lisinopril 10 MG tablet Commonly known as: ZESTRIL Take 1 tablet (10 mg total) by mouth daily. Started by: [name], MD   magnesium hydroxide 400 MG/5ML suspension Commonly known as: MILK OF MAGNESIA Take 30 mLs by mouth daily as needed for mild constipation.   metFORMIN 500 MG 24 hr tablet Commonly known as: GLUCOPHAGE-XR TAKE 1 TABLET 2 TIMES  A DAY WITH BREAKFAST & DINNER. What changed: See the new instructions.   methenamine 1 g tablet Commonly known as: HIPREX Take 1 g by mouth 2 (two) times daily with a meal.   metoprolol succinate 100 MG 24 hr tablet Commonly known as: TOPROL-XL Take 1 tablet (100 mg total) by mouth daily. For blood pressure control Started by: [name], MD   metoprolol tartrate 50 MG tablet Commonly known as: LOPRESSOR Take 1.5 tablets (75 mg total) by mouth 2 (two) times daily.   nitroGLYCERIN 0.4 MG SL tablet Commonly known as: NITROSTAT PLACE 1 TAB UNDER TONGUE EVERY 5 MIN UP TO 3 TIMES AS NEEDED FOR CHEST PAIN, IF NO RELIEF CALL 911. What changed: See the new instructions.   PARoxetine 20 MG tablet Commonly known as: PAXIL TAKE 1 TABLET BY MOUTH EVERY MORNING.   polyethylene glycol 17 g packet Commonly known as: MiraLax Take 17 g by mouth daily. What changed:   when to take this  reasons to take this   ramipril 5 MG capsule Commonly known as: ALTACE Take 1 capsule (5 mg total) by mouth daily.        Follow-up: Return in about 1 week (around 09/22/2019).  [name], M.D.

## 2019-09-16 LAB — CBC WITH DIFFERENTIAL/PLATELET
Basophils Absolute: 0.1 10*3/uL (ref 0.0–0.2)
Basos: 1 %
EOS (ABSOLUTE): 0.1 10*3/uL (ref 0.0–0.4)
Eos: 1 %
Hematocrit: 35.1 % (ref 34.0–46.6)
Hemoglobin: 10.5 g/dL — ABNORMAL LOW (ref 11.1–15.9)
Immature Grans (Abs): 0 10*3/uL (ref 0.0–0.1)
Immature Granulocytes: 0 %
Lymphocytes Absolute: 0.7 10*3/uL (ref 0.7–3.1)
Lymphs: 12 %
MCH: 27.9 pg (ref 26.6–33.0)
MCHC: 29.9 g/dL — ABNORMAL LOW (ref 31.5–35.7)
MCV: 93 fL (ref 79–97)
Monocytes Absolute: 0.4 10*3/uL (ref 0.1–0.9)
Monocytes: 6 %
Neutrophils Absolute: 4.9 10*3/uL (ref 1.4–7.0)
Neutrophils: 80 %
Platelets: 162 10*3/uL (ref 150–450)
RBC: 3.77 x10E6/uL (ref 3.77–5.28)
RDW: 13.4 % (ref 11.7–15.4)
WBC: 6.2 10*3/uL (ref 3.4–10.8)

## 2019-09-16 LAB — CMP14+EGFR
ALT: 19 IU/L (ref 0–32)
AST: 18 IU/L (ref 0–40)
Albumin/Globulin Ratio: 1.5 (ref 1.2–2.2)
Albumin: 3.3 g/dL — ABNORMAL LOW (ref 3.6–4.6)
Alkaline Phosphatase: 48 IU/L (ref 39–117)
BUN/Creatinine Ratio: 30 — ABNORMAL HIGH (ref 12–28)
BUN: 50 mg/dL — ABNORMAL HIGH (ref 8–27)
Bilirubin Total: 0.3 mg/dL (ref 0.0–1.2)
CO2: 24 mmol/L (ref 20–29)
Calcium: 9.1 mg/dL (ref 8.7–10.3)
Chloride: 104 mmol/L (ref 96–106)
Creatinine, Ser: 1.67 mg/dL — ABNORMAL HIGH (ref 0.57–1.00)
GFR calc Af Amer: 32 mL/min/{1.73_m2} — ABNORMAL LOW (ref >59)
GFR calc non Af Amer: 28 mL/min/{1.73_m2} — ABNORMAL LOW (ref >59)
Globulin, Total: 2.2 g/dL (ref 1.5–4.5)
Glucose: 132 mg/dL — ABNORMAL HIGH (ref 65–99)
Potassium: 4.8 mmol/L (ref 3.5–5.2)
Sodium: 141 mmol/L (ref 134–144)
Total Protein: 5.5 g/dL — ABNORMAL LOW (ref 6.0–8.5)

## 2019-09-16 LAB — BRAIN NATRIURETIC PEPTIDE: BNP: 1151 pg/mL — ABNORMAL HIGH (ref 0.0–100.0)

## 2019-09-18 ENCOUNTER — Encounter: Payer: Self-pay | Admitting: Family Medicine

## 2019-09-18 ENCOUNTER — Ambulatory Visit (INDEPENDENT_AMBULATORY_CARE_PROVIDER_SITE_OTHER): Payer: Medicare Other | Admitting: Family Medicine

## 2019-09-18 ENCOUNTER — Other Ambulatory Visit: Payer: Self-pay

## 2019-09-18 DIAGNOSIS — N898 Other specified noninflammatory disorders of vagina: Secondary | ICD-10-CM | POA: Diagnosis not present

## 2019-09-18 MED ORDER — FLUCONAZOLE 150 MG PO TABS: 150.0000 mg | ORAL_TABLET | Freq: Once | ORAL | 0 refills | Status: AC

## 2019-09-18 MED ORDER — CLOTRIMAZOLE-BETAMETHASONE 1-0.05 % EX CREA: 1.0000 "application " | TOPICAL_CREAM | Freq: Two times a day (BID) | CUTANEOUS | 0 refills | Status: DC

## 2019-09-18 NOTE — Progress Notes (Signed)
Virtual Visit via telephone Note Due to COVID-19 pandemic this visit was conducted virtually. This visit type was conducted due to national recommendations for restrictions regarding the COVID-19 Pandemic (e.g. social distancing, sheltering in place) in an effort to limit this patient's exposure and mitigate transmission in our community. All issues noted in this document were discussed and addressed.  A physical exam was not performed with this format.   I connected with [name] and her daughter on 09/18/19 at 1205 by telephone and verified that I am speaking with the correct person using two identifiers. Vincent Ehrler Bulluck is currently located at home and family is currently with them during visit. The provider, Monia Pouch, FNP is located in their office at time of visit.  I discussed the limitations, risks, security and privacy concerns of performing an evaluation and management service by telephone and the availability of in person appointments. I also discussed with the patient that there may be a patient responsible charge related to this service. The patient expressed understanding and agreed to proceed.  Subjective:  [name], female    DOB: [birth year], 85 y.o.   MRN: [id number]  Chief Complaint:  Vaginal Itching   HPI: [name] is a 85 y.o. female presenting on 09/18/2019 for Vaginal Itching   Pt and daughter report vaginal irritation, pruritis, and swelling. States this started a few days ago and is getting worse. Pt denies abdominal pain, vaginal discharge or bleeding, dysuria, flank pain, pelvic pain, fever, chills, or confusion. Daughter states the pt has recently been treated with antibiotics for an urinary tract infection. States she started complaining with the symptoms after her bath. States her labia is swollen, very red, and irritated looking. Daughter does not report any visible discharge.     Relevant past medical, surgical, family, and  social history reviewed and updated as indicated.  Allergies and medications reviewed and updated.   Past Medical History:  Diagnosis Date  . Anemia   . Anxiety   . Aortic stenosis   . Arthritis    back, knees, and hips  . Asthma    with allergies  . Balance problems   . CAD (coronary artery disease)   . Carotid stenosis   . CHF (congestive heart failure) ([place])    EF preserved Echo 2012  . CKD (chronic kidney disease)   . 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 Dementia ([place])    "a little"  . Depression   . Diabetes mellitus    type 2  . GERD (gastroesophageal reflux disease)   . Hypertension   . Iron deficiency anemia 05/27/2011  . Myocardial infarction ([place])   . Renal insufficiency   . Scoliosis   . Vertigo    "when sugar gets low"  . Vision loss    left eye-"half blind"    Past Surgical History:  Procedure Laterality Date  . APPENDECTOMY     with hysterectomy  . CATARACT EXTRACTION Bilateral 5 years ago  . CHOLECYSTECTOMY    . COLONOSCOPY  2005   Dr. Amedeo Plenty: Diverticulosis  . COLONOSCOPY WITH PROPOFOL N/A 02/14/2019   Procedure: COLONOSCOPY WITH PROPOFOL;  Surgeon: Danie Binder, MD;  [facility];  Service: Endoscopy;  Laterality: N/A;  . CORONARY ANGIOPLASTY     prior to 2006 5 stents  . CORONARY ARTERY BYPASS GRAFT  2006  . I &  D of [place]  April 2013  . KNEE SURGERY Right   . PACEMAKER IMPLANT N/A 07/24/2019   Procedure: PACEMAKER IMPLANT;  Surgeon: Evans Lance, MD;  Location: [facility];  Service: Cardiovascular;  Laterality: N/A;  . PACEMAKER INSERTION    . POLYPECTOMY  02/14/2019   Procedure: POLYPECTOMY;  Surgeon: Danie Binder, MD;  [facility];  Service: Endoscopy;;  cecal polyp  . TOTAL ABDOMINAL HYSTERECTOMY  ~85 years old   complete, with tumor removal  . TOTAL KNEE ARTHROPLASTY Right 08/13/2015   Procedure: RIGHT  TOTAL KNEE  ARTHROPLASTY;  Surgeon: Paralee Cancel, MD;  Location: [facility];  Service: Orthopedics;  Laterality: Right;    Social History   Socioeconomic History  . Marital status: Widowed    Spouse name: Not on file  . Number of children: 4  . Years of education: 3  . Highest education level: 3rd grade  Occupational History  . Occupation: retired  Scientific laboratory technician  . Financial resource strain: Not very hard  . Food insecurity    Worry: Never true    Inability: Never true  . Transportation needs    Medical: No    Non-medical: No  Tobacco Use  . Smoking status: Never Smoker  . Smokeless tobacco: Never Used  Substance and Sexual Activity  . Alcohol use: No  . Drug use: No  . Sexual activity: Not Currently  Lifestyle  . Physical activity    Days per week: 0 days    Minutes per session: 0 min  . Stress: To some extent  Relationships  . Social connections    Talks on phone: More than three times a week    Gets together: More than three times a week    Attends religious service: More than 4 times per year    Active member of club or organization: No    Attends meetings of clubs or organizations: Never    Relationship status: Widowed  . Intimate partner violence    Fear of current or ex partner: No    Emotionally abused: No    Physically abused: No    Forced sexual activity: No  Other Topics Concern  . Not on file  Social History Narrative  . Not on file    Outpatient Encounter Medications as of 09/18/2019  Medication Sig  . albuterol (PROVENTIL HFA;VENTOLIN HFA) 108 (90 Base) MCG/ACT inhaler Inhale 2 puffs into the lungs every 6 (six) hours as needed for wheezing or shortness of breath.  Marland Kitchen apixaban (ELIQUIS) 2.5 MG TABS tablet Take 1 tablet (2.5 mg total) by mouth 2 (two) times daily.  Marland Kitchen atorvastatin (LIPITOR) 20 MG tablet Take 1 tablet (20 mg total) by mouth at bedtime.  . Carboxymethylcellul-Glycerin (CLEAR EYES FOR DRY EYES) 1-0.25 % SOLN Place 1 drop into both eyes daily as needed.   . clotrimazole-betamethasone (LOTRISONE) cream Apply 1 application topically 2 (two) times daily.  Marland Kitchen esomeprazole (NEXIUM) 40 MG capsule TAKE 1 CAPSULE BY MOUTH ONCE DAILY. (Patient taking differently: Take 40 mg by mouth every morning. )  . ferrous sulfate 324 (65 Fe) MG TBEC Take 324 mg by mouth every morning.   . fluconazole (DIFLUCAN) 150 MG tablet Take 1 tablet (150 mg total) by mouth once for 1 dose.  . furosemide (LASIX) 20 MG tablet Take 2 tablets (40 mg total) by mouth every morning.  . hydrALAZINE (APRESOLINE) 25 MG tablet Take 1 tablet (25 mg total) by mouth 3 (three) times daily.  Marland Kitchen  insulin lispro (HUMALOG KWIKPEN) 100 UNIT/ML KiwkPen Inject 0.04-0.1 mLs (4-10 Units total) into the skin daily as needed (high blood sugar). (Patient taking differently: Inject 5-15 Units into the skin daily as needed (for high blood sugar levels over 250). )  . LEVEMIR FLEXTOUCH 100 UNIT/ML Pen INJECT 24 UNITS EACH MORNING (Patient taking differently: Inject 24 Units into the skin every morning. )  . lisinopril (ZESTRIL) 10 MG tablet Take 1 tablet (10 mg total) by mouth daily.  . magnesium hydroxide (MILK OF MAGNESIA) 400 MG/5ML suspension Take 30 mLs by mouth daily as needed for mild constipation.  . metFORMIN (GLUCOPHAGE-XR) 500 MG 24 hr tablet TAKE 1 TABLET 2 TIMES A DAY WITH BREAKFAST & DINNER. (Patient taking differently: Take 500 mg by mouth 2 (two) times daily. )  . methenamine (HIPREX) 1 g tablet Take 1 g by mouth 2 (two) times daily with a meal.   . metoprolol succinate (TOPROL-XL) 100 MG 24 hr tablet Take 1 tablet (100 mg total) by mouth daily. For blood pressure control  . metoprolol tartrate (LOPRESSOR) 50 MG tablet Take 1.5 tablets (75 mg total) by mouth 2 (two) times daily.  . Multiple Vitamins-Iron (DAILY VITAMINS/IRON/BETA CAROT PO) Take 1 tablet by mouth at bedtime.  . nitroGLYCERIN (NITROSTAT) 0.4 MG SL tablet PLACE 1 TAB UNDER TONGUE EVERY 5 MIN UP TO 3 TIMES AS NEEDED FOR CHEST PAIN, IF  NO RELIEF CALL 911. (Patient taking differently: Place 0.4 mg under the tongue every 5 (five) minutes as needed for chest pain. )  . PARoxetine (PAXIL) 20 MG tablet TAKE 1 TABLET BY MOUTH EVERY MORNING. (Patient taking differently: Take 20 mg by mouth every morning. )  . polyethylene glycol (MIRALAX) packet Take 17 g by mouth daily. (Patient taking differently: Take 17 g by mouth daily as needed for mild constipation or moderate constipation. )  . ramipril (ALTACE) 5 MG capsule Take 1 capsule (5 mg total) by mouth daily.   No facility-administered encounter medications on file as of 09/18/2019.     Allergies  Allergen Reactions  . Advil [Ibuprofen] Swelling  . Heparin Other (See Comments)    Confusion, hallucinations.   . Propoxyphene N-Acetaminophen Other (See Comments)    Numbness all over. "floating" sensation.    Review of Systems  Constitutional: Negative for chills, fatigue and fever.  Respiratory: Negative for cough and shortness of breath.   Cardiovascular: Negative for chest pain, palpitations and leg swelling.  Gastrointestinal: Negative for abdominal distention, abdominal pain, anal bleeding, blood in stool, constipation, diarrhea, nausea, rectal pain and vomiting.  Genitourinary: Positive for vaginal pain. Negative for difficulty urinating, dysuria, flank pain, pelvic pain, urgency, vaginal bleeding and vaginal discharge.  Neurological: Negative for weakness.  Psychiatric/Behavioral: Negative for confusion.  All other systems reviewed and are negative.        Observations/Objective: No vital signs or physical exam, this was a telephone or virtual health encounter.  Pt alert and oriented, answers all questions appropriately, and able to speak in full sentences.    Assessment and Plan: Cinde was seen today for vaginal itching.  Diagnoses and all orders for this visit:  Vaginal irritation Reported symptoms consistent with vaginal candidal infection. Pt has vaginal  irritation, swelling, pruritis, and a recent history of antibiotic use. Will treat with below. Pt aware to report any new, worsening, or persistent symptoms.  -     fluconazole (DIFLUCAN) 150 MG tablet; Take 1 tablet (150 mg total) by mouth once for 1 dose. -  clotrimazole-betamethasone (LOTRISONE) cream; Apply 1 application topically 2 (two) times daily.     Follow Up Instructions: Return if symptoms worsen or fail to improve.    I discussed the assessment and treatment plan with the patient. The patient was provided an opportunity to ask questions and all were answered. The patient agreed with the plan and demonstrated an understanding of the instructions.   The patient was advised to call back or seek an in-person evaluation if the symptoms worsen or if the condition fails to improve as anticipated.  The above assessment and management plan was discussed with the patient. The patient verbalized understanding of and has agreed to the management plan. Patient is aware to call the clinic if they develop any new symptoms or if symptoms persist or worsen. Patient is aware when to return to the clinic for a follow-up visit. Patient educated on when it is appropriate to go to the emergency department.    I provided 15 minutes of non-face-to-face time during this encounter. The call started at 1205. The call ended at 1220. The other time was used for coordination of care.    Monia Pouch, FNP-C [facility] [address] [place], [place] [address] [phone] 09/18/19

## 2019-09-19 NOTE — Progress Notes (Signed)
HPI The patient presents for followup of CAD, atrial fib and bradycardia.   She was admitted in late August with tachybrady and she had a pacemaker placed.  She did have atrial fib on device interrogation recently.  I saw her and we started anticoagulation.   At the last visit I increased her metoprolol for rate control and started anticoagulation.    She has been seen recently.  She is having lots of problems with hemorrhoids and constipation and significant pain.  She apparently is self-medicating with different things that she inserts rectally per her daughter.  She sits up at night moaning.  She is also noted to have nausea.  This was thought to be related to hydralazine which is been weaned.  I note that she has had recent BMPs.  Was 1201.  When found to 1151.  She has been treated with increased dose of diuretic.  She had a CT earlier this month.  She was noted to have a large right pleural effusion with a questionable mass in the lungs.  Her Lasix dose has been increased.  Is very difficult to understand Mrs. Salsgiver today as her usually mumbled speech is more garbled.  She is got no focal motor abnormalities and her daughter who pays a quite a bit of attention has not noticed any neurologic deficits.  She says she is not breathing poorly.  She says her oxygen levels are excellent.  Her lower extremity swelling is less than it was.  Her weights have been stable.  She is not describing PND or orthopnea.  She is had no cough fevers or chills.    Allergies  Allergen Reactions  . Advil [Ibuprofen] Swelling  . Heparin Other (See Comments)    Confusion, hallucinations.   . Propoxyphene N-Acetaminophen Other (See Comments)    Numbness all over. "floating" sensation.    Current Outpatient Medications  Medication Sig Dispense Refill  . albuterol (PROVENTIL HFA;VENTOLIN HFA) 108 (90 Base) MCG/ACT inhaler Inhale 2 puffs into the lungs every 6 (six) hours as needed for wheezing or shortness of  breath. 1 Inhaler 5  . apixaban (ELIQUIS) 2.5 MG TABS tablet Take 1 tablet (2.5 mg total) by mouth 2 (two) times daily. 60 tablet 11  . atorvastatin (LIPITOR) 20 MG tablet Take 1 tablet (20 mg total) by mouth at bedtime. 90 tablet 3  . Carboxymethylcellul-Glycerin (CLEAR EYES FOR DRY EYES) 1-0.25 % SOLN Place 1 drop into both eyes daily as needed.    . clotrimazole-betamethasone (LOTRISONE) cream Apply 1 application topically 2 (two) times daily. 30 g 0  . esomeprazole (NEXIUM) 40 MG capsule TAKE 1 CAPSULE BY MOUTH ONCE DAILY. (Patient taking differently: Take 40 mg by mouth every morning. ) 30 capsule 0  . ferrous sulfate 324 (65 Fe) MG TBEC Take 324 mg by mouth every morning.     . hydrALAZINE (APRESOLINE) 25 MG tablet Take 1 tablet (25 mg total) by mouth 3 (three) times daily. 270 tablet 1  . insulin lispro (HUMALOG KWIKPEN) 100 UNIT/ML KiwkPen Inject 0.04-0.1 mLs (4-10 Units total) into the skin daily as needed (high blood sugar). (Patient taking differently: Inject 5-15 Units into the skin daily as needed (for high blood sugar levels over 250). ) 15 mL 5  . LEVEMIR FLEXTOUCH 100 UNIT/ML Pen INJECT 24 UNITS EACH MORNING (Patient taking differently: Inject 24 Units into the skin every morning. ) 6 mL 0  . lisinopril (ZESTRIL) 10 MG tablet Take 1 tablet (10 mg  total) by mouth daily. 90 tablet 3  . magnesium hydroxide (MILK OF MAGNESIA) 400 MG/5ML suspension Take 30 mLs by mouth daily as needed for mild constipation.    . metFORMIN (GLUCOPHAGE-XR) 500 MG 24 hr tablet TAKE 1 TABLET 2 TIMES A DAY WITH BREAKFAST & DINNER. (Patient taking differently: Take 500 mg by mouth 2 (two) times daily. ) 60 tablet 0  . methenamine (HIPREX) 1 g tablet Take 1 g by mouth 2 (two) times daily with a meal.     . metoprolol tartrate (LOPRESSOR) 50 MG tablet Take 1.5 tablets (75 mg total) by mouth 2 (two) times daily. 90 tablet 11  . Multiple Vitamins-Iron (DAILY VITAMINS/IRON/BETA CAROT PO) Take 1 tablet by mouth at  bedtime.    . nitroGLYCERIN (NITROSTAT) 0.4 MG SL tablet PLACE 1 TAB UNDER TONGUE EVERY 5 MIN UP TO 3 TIMES AS NEEDED FOR CHEST PAIN, IF NO RELIEF CALL 911. (Patient taking differently: Place 0.4 mg under the tongue every 5 (five) minutes as needed for chest pain. ) 25 tablet 0  . PARoxetine (PAXIL) 20 MG tablet TAKE 1 TABLET BY MOUTH EVERY MORNING. (Patient taking differently: Take 20 mg by mouth every morning. ) 30 tablet 0  . polyethylene glycol (MIRALAX) packet Take 17 g by mouth daily. (Patient taking differently: Take 17 g by mouth daily as needed for mild constipation or moderate constipation. ) 30 each 0  . ramipril (ALTACE) 5 MG capsule Take 1 capsule (5 mg total) by mouth daily. 90 capsule 3  . furosemide (LASIX) 40 MG tablet Take 2 tablets (80 mg total) by mouth daily. 60 tablet 6  . metoprolol succinate (TOPROL-XL) 100 MG 24 hr tablet Take 1 tablet (100 mg total) by mouth daily. For blood pressure control (Patient not taking: Reported on 09/20/2019) 30 tablet 1   No current facility-administered medications for this visit.     Past Medical History:  Diagnosis Date  . Anemia   . Anxiety   . Aortic stenosis   . Arthritis    back, knees, and hips  . Asthma    with allergies  . Balance problems   . CAD (coronary artery disease)   . Carotid stenosis   . CHF (congestive heart failure) ([place])    EF preserved Echo 2012  . CKD (chronic kidney disease)   . 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 Dementia ([place])    "a little"  . Depression   . Diabetes mellitus    type 2  . GERD (gastroesophageal reflux disease)   . Hypertension   . Iron deficiency anemia 05/27/2011  . Myocardial infarction ([place])   . Renal insufficiency   . Scoliosis   . Vertigo    "when sugar gets low"  . Vision loss    left eye-"half blind"    Past Surgical History:  Procedure Laterality Date  . APPENDECTOMY     with  hysterectomy  . CATARACT EXTRACTION Bilateral 5 years ago  . CHOLECYSTECTOMY    . COLONOSCOPY  2005   Dr. Amedeo Plenty: Diverticulosis  . COLONOSCOPY WITH PROPOFOL N/A 02/14/2019   Procedure: COLONOSCOPY WITH PROPOFOL;  Surgeon: Danie Binder, MD;  [facility];  Service: Endoscopy;  Laterality: N/A;  . CORONARY ANGIOPLASTY     prior to 2006 5 stents  . CORONARY ARTERY BYPASS GRAFT  2006  . I & D of [place]  April 2013  .  KNEE SURGERY Right   . PACEMAKER IMPLANT N/A 07/24/2019   Procedure: PACEMAKER IMPLANT;  Surgeon: Evans Lance, MD;  Location: [facility];  Service: Cardiovascular;  Laterality: N/A;  . PACEMAKER INSERTION    . POLYPECTOMY  02/14/2019   Procedure: POLYPECTOMY;  Surgeon: Danie Binder, MD;  [facility];  Service: Endoscopy;;  cecal polyp  . TOTAL ABDOMINAL HYSTERECTOMY  ~85 years old   complete, with tumor removal  . TOTAL KNEE ARTHROPLASTY Right 08/13/2015   Procedure: RIGHT  TOTAL KNEE ARTHROPLASTY;  Surgeon: Paralee Cancel, MD;  [facility];  Service: Orthopedics;  Laterality: Right;    ROS: Positive for none.  Otherwise as stated in the HPI and negative for all other systems.  PHYSICAL EXAM BP (!) 166/109   Pulse (!) 114   Ht 5\' 6"  (1.676 m)   Wt 156 lb (70.8 kg)   BMI 25.18 kg/m   GEN: Chronically ill-appearing, no distress NECK:  No jugular venous distention at 90 degrees, waveform within normal limits, carotid upstroke brisk and symmetric, no bruits, no thyromegaly LYMPHATICS:  No cervical adenopathy LUNGS: Decreased breath sounds most of the way at the right lung BACK:  No CVA tenderness CHEST:  Unremarkable HEART:  S1 and S2 within normal limits, no S3,  no clicks, no rubs, 3 out of 6 apical systolic murmur, irregular radiating at the aortic outflow tract, no diastolic murmurs ABD:  Positive bowel sounds normal in frequency in pitch, no bruits, no rebound, no guarding, unable to assess midline mass or bruit with the  patient seated. EXT:  2 plus pulses throughout, moderate edema, no cyanosis no clubbing SKIN:  No rashes no nodules NEURO:  Cranial nerves II through XII grossly intact, motor grossly intact throughout PSYCH:  Cognitively intact, oriented to person place and time   EKG:   NA  Lab Results  Component Value Date   CHOL 150 10/18/2018   TRIG 130 10/18/2018   HDL 82 10/18/2018   LDLCALC 42 10/18/2018   Lab Results  Component Value Date   CREATININE 1.67 (H) 09/15/2019    ASSESSMENT AND PLAN  ATRIAL FIB:  Ms. JEANNEMARIE SAWAYA has a CHA2DS2 - VASc score of 8.   She is tolerating anticoagulation at this point.  Despite her high risk of stroke I think she be okay to come off of this if we need to do any invasive evaluation.  For now she will continue with rate control and anticoagulation.   CAD: Patient's not having any new angina.  No change in therapy.  CAROTID STENOSIS:  This was mild in 2017.  She is tolerating anticoagulation I will follow this with repeat carotid Dopplers in the future.   AS: This was moderate last July on echo.  No change in therapy.  No further imaging.  I reviewed the echo again today.  She has a large left atrium.  She has mild to moderate mitral regurgitation.  There is some mitral stenosis that is only mild.  She has a well-preserved ejection fraction.  HTN: Her blood pressure is elevated today but she is in significant pain.  No change in therapy.  DM:  Her A1C is up to 8.5 from 6.8.  I will defer to [name], MD  DYSLIPIDEMIA:  LDL was controlled no change in therapy.   CKD:  Creat was improved 5 days ago at 1.67.  No change in therapy.  I did increase her Lasix however given her  decreased breath sounds and obvious continued pleural effusion.   SNORING:   She has some snoring and possibly restless legs but I think a sleep study might be difficult.  I am not can order this testing at this point.  HEMORRHOIDS: I will defer to Dr. Livia Snellen.  She might  need referral to a surgeon though she be somewhat high risk.  PLEURAL EFFUSION: She has decreased breath sounds and I am sure still continue large left pleural effusion.  She is going to see Dr. Livia Snellen tomorrow and might get further imaging.  I would suggest that she will probably need a thoracentesis for both diagnostic and therapeutic purposes although she is not currently oxygenating poorly.

## 2019-09-20 ENCOUNTER — Encounter: Payer: Self-pay | Admitting: Cardiology

## 2019-09-20 ENCOUNTER — Other Ambulatory Visit: Payer: Self-pay

## 2019-09-20 ENCOUNTER — Ambulatory Visit (INDEPENDENT_AMBULATORY_CARE_PROVIDER_SITE_OTHER): Payer: Medicare Other | Admitting: Cardiology

## 2019-09-20 VITALS — BP 166/109 | HR 114 | Ht 66.0 in | Wt 156.0 lb

## 2019-09-20 DIAGNOSIS — J90 Pleural effusion, not elsewhere classified: Secondary | ICD-10-CM | POA: Diagnosis not present

## 2019-09-20 DIAGNOSIS — I480 Paroxysmal atrial fibrillation: Secondary | ICD-10-CM | POA: Diagnosis not present

## 2019-09-20 DIAGNOSIS — I10 Essential (primary) hypertension: Secondary | ICD-10-CM

## 2019-09-20 DIAGNOSIS — I5032 Chronic diastolic (congestive) heart failure: Secondary | ICD-10-CM

## 2019-09-20 DIAGNOSIS — K649 Unspecified hemorrhoids: Secondary | ICD-10-CM | POA: Diagnosis not present

## 2019-09-20 MED ORDER — FUROSEMIDE 40 MG PO TABS: 80.0000 mg | ORAL_TABLET | Freq: Every day | ORAL | 6 refills | Status: DC

## 2019-09-20 NOTE — Patient Instructions (Signed)
Medication Instructions:  Please increase Furosemide 40 mg take (2) tablets every morning. Continue all other medications as listed.  If you need a refill on your cardiac medications before your next appointment, please call your pharmacy.   Follow-Up: Follow up in 3 months with Dr Percival Spanish.  Thank you for choosing [facility]!!

## 2019-09-21 ENCOUNTER — Other Ambulatory Visit: Payer: Self-pay

## 2019-09-21 ENCOUNTER — Ambulatory Visit (INDEPENDENT_AMBULATORY_CARE_PROVIDER_SITE_OTHER): Payer: Medicare Other | Admitting: Family Medicine

## 2019-09-21 ENCOUNTER — Encounter: Payer: Self-pay | Admitting: Family Medicine

## 2019-09-21 VITALS — BP 150/93 | HR 108 | Temp 96.4°F | Resp 20 | Ht 66.0 in | Wt 156.0 lb

## 2019-09-21 DIAGNOSIS — R1030 Lower abdominal pain, unspecified: Secondary | ICD-10-CM

## 2019-09-21 MED ORDER — HYDROXYZINE HCL 50 MG PO TABS: 50.0000 mg | ORAL_TABLET | Freq: Three times a day (TID) | ORAL | 0 refills | Status: DC | PRN

## 2019-09-21 NOTE — Progress Notes (Signed)
Subjective:  [name], female    DOB: 26-Feb-1931  Age: 85 y.o. MRN: [id number]  CC: 1 week recheck and discuss CT results   HPI [name] presents for review of BP after DC of her lisinopril and hydralazine. She also was noted to have a lung mass on CT. She also has moderate right sided effusion.   The need to follow with pulmonary was also reinforced. Pt. States nausea has resolved since DC of hydralazine.She denies dyspnea. Pt. Continues to have lower abdominal pain as well.   Depression screen [facility] 2/9 09/21/2019 09/15/2019 07/03/2019  Decreased Interest 0 0 0  Down, Depressed, Hopeless 0 0 0  PHQ - 2 Score 0 0 0  Altered sleeping - - -  Tired, decreased energy - - -  Change in appetite - - -  Feeling bad or failure about yourself  - - -  Trouble concentrating - - -  Moving slowly or fidgety/restless - - -  Suicidal thoughts - - -  PHQ-9 Score - - -  Difficult doing work/chores - - -  Some recent data might be hidden    History [name] has a past medical history of Anemia, Anxiety, Aortic stenosis, Arthritis, Asthma, Balance problems, CAD (coronary artery disease), Carotid stenosis, CHF (congestive heart failure) ([place]), CKD (chronic kidney disease), CVA (cerebral infarction), Dementia ([place]), Depression, Diabetes mellitus, GERD (gastroesophageal reflux disease), Hypertension, Iron deficiency anemia (05/27/2011), Myocardial infarction [facility]), Renal insufficiency, Scoliosis, Vertigo, and Vision loss.   She has a past surgical history that includes I & D of [place] (April 2013); Coronary artery bypass graft (2006); Coronary angioplasty; Total abdominal hysterectomy (~85 years old); Cholecystectomy; Appendectomy; Cataract extraction (Bilateral, 5 years ago); Total knee arthroplasty (Right, 08/13/2015); Knee surgery (Right); Colonoscopy (2005); Colonoscopy with propofol (N/A, 02/14/2019); polypectomy (02/14/2019); PACEMAKER IMPLANT (N/A, 07/24/2019); and Pacemaker insertion.    Her family history includes Cancer in her brother; Diabetes in her sister and sister; Early death in her sister and son; Heart attack in her brother, brother, sister, and son; Heart attack (age of onset: 64) in her daughter; Heart disease in her brother, brother, and brother; Hypertension in her sister; Osteoporosis in her sister; Pancreatitis in her son.She reports that she has never smoked. She has never used smokeless tobacco. She reports that she does not drink alcohol or use drugs.    ROS Review of Systems  Constitutional: Negative.   HENT: Negative for congestion.   Eyes: Negative for visual disturbance.  Respiratory: Negative for shortness of breath.   Cardiovascular: Negative for chest pain.  Gastrointestinal: Positive for abdominal pain (BLQ). Negative for constipation, diarrhea and vomiting.  Genitourinary: Negative for difficulty urinating.  Musculoskeletal: Negative for arthralgias and myalgias.  Neurological: Negative for headaches.  Psychiatric/Behavioral: Negative for sleep disturbance.    Objective:  BP (!) 150/93    Pulse (!) 108    Temp (!) 96.4 F (35.8 C) (Temporal)    Resp 20    Ht 5\' 6"  (1.676 m)    Wt 156 lb (70.8 kg)    SpO2 96%    BMI 25.18 kg/m   BP Readings from Last 3 Encounters:  09/25/19 124/65  09/21/19 (!) 150/93  09/20/19 (!) 166/109    Wt Readings from Last 3 Encounters:  09/25/19 152 lb 8.9 oz (69.2 kg)  09/21/19 156 lb (70.8 kg)  09/20/19 156 lb (70.8 kg)     Physical Exam Constitutional:      General: She is not in  acute distress.    Appearance: She is well-developed.  HENT:     Head: Normocephalic and atraumatic.     Right Ear: External ear normal.     Left Ear: External ear normal.     Nose: Nose normal.  Eyes:     Conjunctiva/sclera: Conjunctivae normal.     Pupils: Pupils are equal, round, and reactive to light.  Neck:     Musculoskeletal: Normal range of motion and neck supple.     Thyroid: No thyromegaly.    Cardiovascular:     Rate and Rhythm: Normal rate and regular rhythm.     Heart sounds: Normal heart sounds. No murmur.  Pulmonary:     Effort: Pulmonary effort is normal. No respiratory distress.     Breath sounds: Rales (right base) present. No wheezing or rhonchi.  Abdominal:     General: Bowel sounds are normal. There is no distension.     Palpations: Abdomen is soft. There is no mass.     Tenderness: There is no abdominal tenderness.  Lymphadenopathy:     Cervical: No cervical adenopathy.  Skin:    General: Skin is warm and dry.  Neurological:     Mental Status: She is alert and oriented to person, place, and time.     Deep Tendon Reflexes: Reflexes are normal and symmetric.  Psychiatric:        Behavior: Behavior normal.        Thought Content: Thought content normal.        Judgment: Judgment normal.       Assessment & Plan:   Rodney was seen today for 1 week recheck and discuss ct results.  Diagnoses and all orders for this visit:  Lower abdominal pain -     CT ABDOMEN PELVIS W CONTRAST; Future  Other orders -     hydrOXYzine (ATARAX/VISTARIL) 50 MG tablet; Take 1 tablet (50 mg total) by mouth 3 (three) times daily as needed for nausea (or pain). (Patient not taking: Reported on 09/22/2019)  Although her nausea has resolved there is still significant abd pain. With concern for lung mass, wll do CT abd, pelvis to determine if abd pain is due to mass in abd that could be the primary for the lung mass.      I have discontinued [name]'s hydrALAZINE. I am also having her start on hydrOXYzine. Additionally, I am having her maintain her Multiple Vitamins-Iron (DAILY VITAMINS/IRON/BETA CAROT PO), insulin lispro, polyethylene glycol, atorvastatin, ferrous sulfate, albuterol, Levemir FlexTouch, PARoxetine, metFORMIN, Clear Eyes for Dry Eyes, magnesium hydroxide, methenamine, metoprolol tartrate, apixaban, nitroGLYCERIN, esomeprazole, ramipril, metoprolol succinate,  lisinopril, clotrimazole-betamethasone, and furosemide.  Allergies as of 09/21/2019      Reactions   Advil [ibuprofen] Swelling   Heparin Other (See Comments)   Confusion, hallucinations.    Propoxyphene N-acetaminophen Other (See Comments)   Numbness all over. "floating" sensation.      Medication List       Accurate as of September 21, 2019 11:59 PM. If you have any questions, ask your nurse or doctor.        STOP taking these medications   hydrALAZINE 25 MG tablet Commonly known as: APRESOLINE Stopped by: [name], MD     TAKE these medications   albuterol 108 (90 Base) MCG/ACT inhaler Commonly known as: VENTOLIN HFA Inhale 2 puffs into the lungs every 6 (six) hours as needed for wheezing or shortness of breath.   apixaban 2.5 MG Tabs tablet Commonly known  asArne [place] Take 1 tablet (2.5 mg total) by mouth 2 (two) times daily.   atorvastatin 20 MG tablet Commonly known as: LIPITOR Take 1 tablet (20 mg total) by mouth at bedtime.   Clear Eyes for Dry Eyes 1-0.25 % Soln Generic drug: Carboxymethylcellul-Glycerin Place 1 drop into both eyes daily as needed (irritation).   clotrimazole-betamethasone cream Commonly known as: LOTRISONE Apply 1 application topically 2 (two) times daily.   DAILY VITAMINS/IRON/BETA CAROT PO Take 1 tablet by mouth at bedtime.   esomeprazole 40 MG capsule Commonly known as: NEXIUM TAKE 1 CAPSULE BY MOUTH ONCE DAILY. What changed: when to take this   ferrous sulfate 324 (65 Fe) MG Tbec Take 324 mg by mouth every morning.   furosemide 40 MG tablet Commonly known as: LASIX Take 2 tablets (80 mg total) by mouth daily.   hydrOXYzine 50 MG tablet Commonly known as: ATARAX/VISTARIL Take 1 tablet (50 mg total) by mouth 3 (three) times daily as needed for nausea (or pain). Started by: [name], MD   insulin lispro 100 UNIT/ML KiwkPen Commonly known as: HumaLOG KwikPen Inject 0.04-0.1 mLs (4-10 Units total) into the skin daily  as needed (high blood sugar). What changed:   how much to take  reasons to take this   Levemir FlexTouch 100 UNIT/ML Pen Generic drug: Insulin Detemir INJECT 24 UNITS EACH MORNING What changed: See the new instructions.   lisinopril 10 MG tablet Commonly known as: ZESTRIL Take 1 tablet (10 mg total) by mouth daily.   magnesium hydroxide 400 MG/5ML suspension Commonly known as: MILK OF MAGNESIA Take 30 mLs by mouth daily as needed for mild constipation.   metFORMIN 500 MG 24 hr tablet Commonly known as: GLUCOPHAGE-XR TAKE 1 TABLET 2 TIMES A DAY WITH BREAKFAST & DINNER. What changed: See the new instructions.   methenamine 1 g tablet Commonly known as: HIPREX Take 1 g by mouth 2 (two) times daily with a meal.   metoprolol succinate 100 MG 24 hr tablet Commonly known as: TOPROL-XL Take 1 tablet (100 mg total) by mouth daily. For blood pressure control   metoprolol tartrate 50 MG tablet Commonly known as: LOPRESSOR Take 1.5 tablets (75 mg total) by mouth 2 (two) times daily.   nitroGLYCERIN 0.4 MG SL tablet Commonly known as: NITROSTAT PLACE 1 TAB UNDER TONGUE EVERY 5 MIN UP TO 3 TIMES AS NEEDED FOR CHEST PAIN, IF NO RELIEF CALL 911. What changed: See the new instructions.   PARoxetine 20 MG tablet Commonly known as: PAXIL TAKE 1 TABLET BY MOUTH EVERY MORNING.   polyethylene glycol 17 g packet Commonly known as: MiraLax Take 17 g by mouth daily. What changed:   when to take this  reasons to take this   ramipril 5 MG capsule Commonly known as: ALTACE Take 1 capsule (5 mg total) by mouth daily.     DC lisinopril. Was intended last week, but continued through misunderstanding. Further BP tx needed. Pt. Daughter states that BP not as high at home. Will monitor this week. Increase med next week. Call if it continues to rise.    Follow-up: Return in about 1 week (around 09/28/2019) for pleural effusion - with CXR.  [name], M.D.

## 2019-09-22 ENCOUNTER — Inpatient Hospital Stay (HOSPITAL_COMMUNITY)
Admission: EM | Admit: 2019-09-22 | Discharge: 2019-09-26 | DRG: 291 | Disposition: A | Discharge status: Home Health | Payer: Medicare Other | Attending: Internal Medicine | Admitting: Internal Medicine

## 2019-09-22 ENCOUNTER — Encounter (HOSPITAL_COMMUNITY): Payer: Self-pay | Admitting: Emergency Medicine

## 2019-09-22 ENCOUNTER — Other Ambulatory Visit: Payer: Self-pay

## 2019-09-22 ENCOUNTER — Emergency Department (HOSPITAL_COMMUNITY): Payer: Medicare Other

## 2019-09-22 ENCOUNTER — Telehealth: Payer: Self-pay

## 2019-09-22 DIAGNOSIS — Z794 Long term (current) use of insulin: Secondary | ICD-10-CM

## 2019-09-22 DIAGNOSIS — N184 Chronic kidney disease, stage 4 (severe): Secondary | ICD-10-CM | POA: Diagnosis present

## 2019-09-22 DIAGNOSIS — R1310 Dysphagia, unspecified: Secondary | ICD-10-CM | POA: Diagnosis present

## 2019-09-22 DIAGNOSIS — Z7901 Long term (current) use of anticoagulants: Secondary | ICD-10-CM

## 2019-09-22 DIAGNOSIS — I4891 Unspecified atrial fibrillation: Secondary | ICD-10-CM | POA: Diagnosis not present

## 2019-09-22 DIAGNOSIS — Z7951 Long term (current) use of inhaled steroids: Secondary | ICD-10-CM | POA: Diagnosis not present

## 2019-09-22 DIAGNOSIS — I509 Heart failure, unspecified: Secondary | ICD-10-CM | POA: Diagnosis not present

## 2019-09-22 DIAGNOSIS — N1832 Chronic kidney disease, stage 3b: Secondary | ICD-10-CM | POA: Diagnosis not present

## 2019-09-22 DIAGNOSIS — K219 Gastro-esophageal reflux disease without esophagitis: Secondary | ICD-10-CM | POA: Diagnosis not present

## 2019-09-22 DIAGNOSIS — R0602 Shortness of breath: Secondary | ICD-10-CM | POA: Diagnosis not present

## 2019-09-22 DIAGNOSIS — Z951 Presence of aortocoronary bypass graft: Secondary | ICD-10-CM

## 2019-09-22 DIAGNOSIS — E785 Hyperlipidemia, unspecified: Secondary | ICD-10-CM | POA: Diagnosis present

## 2019-09-22 DIAGNOSIS — Z96651 Presence of right artificial knee joint: Secondary | ICD-10-CM | POA: Diagnosis present

## 2019-09-22 DIAGNOSIS — J45909 Unspecified asthma, uncomplicated: Secondary | ICD-10-CM | POA: Diagnosis present

## 2019-09-22 DIAGNOSIS — N179 Acute kidney failure, unspecified: Secondary | ICD-10-CM | POA: Diagnosis not present

## 2019-09-22 DIAGNOSIS — I5033 Acute on chronic diastolic (congestive) heart failure: Secondary | ICD-10-CM | POA: Diagnosis not present

## 2019-09-22 DIAGNOSIS — Z79899 Other long term (current) drug therapy: Secondary | ICD-10-CM

## 2019-09-22 DIAGNOSIS — I10 Essential (primary) hypertension: Secondary | ICD-10-CM | POA: Diagnosis not present

## 2019-09-22 DIAGNOSIS — E1122 Type 2 diabetes mellitus with diabetic chronic kidney disease: Secondary | ICD-10-CM

## 2019-09-22 DIAGNOSIS — I2510 Atherosclerotic heart disease of native coronary artery without angina pectoris: Secondary | ICD-10-CM | POA: Diagnosis present

## 2019-09-22 DIAGNOSIS — Z833 Family history of diabetes mellitus: Secondary | ICD-10-CM

## 2019-09-22 DIAGNOSIS — F419 Anxiety disorder, unspecified: Secondary | ICD-10-CM | POA: Diagnosis present

## 2019-09-22 DIAGNOSIS — Z950 Presence of cardiac pacemaker: Secondary | ICD-10-CM | POA: Diagnosis not present

## 2019-09-22 DIAGNOSIS — I252 Old myocardial infarction: Secondary | ICD-10-CM

## 2019-09-22 DIAGNOSIS — I110 Hypertensive heart disease with heart failure: Secondary | ICD-10-CM | POA: Diagnosis not present

## 2019-09-22 DIAGNOSIS — Z90710 Acquired absence of both cervix and uterus: Secondary | ICD-10-CM

## 2019-09-22 DIAGNOSIS — Z8673 Personal history of transient ischemic attack (TIA), and cerebral infarction without residual deficits: Secondary | ICD-10-CM

## 2019-09-22 DIAGNOSIS — F329 Major depressive disorder, single episode, unspecified: Secondary | ICD-10-CM | POA: Diagnosis present

## 2019-09-22 DIAGNOSIS — K5900 Constipation, unspecified: Secondary | ICD-10-CM | POA: Diagnosis not present

## 2019-09-22 DIAGNOSIS — N183 Chronic kidney disease, stage 3 unspecified: Secondary | ICD-10-CM | POA: Diagnosis not present

## 2019-09-22 DIAGNOSIS — K5904 Chronic idiopathic constipation: Secondary | ICD-10-CM | POA: Diagnosis not present

## 2019-09-22 DIAGNOSIS — I130 Hypertensive heart and chronic kidney disease with heart failure and stage 1 through stage 4 chronic kidney disease, or unspecified chronic kidney disease: Principal | ICD-10-CM | POA: Diagnosis present

## 2019-09-22 DIAGNOSIS — E1121 Type 2 diabetes mellitus with diabetic nephropathy: Secondary | ICD-10-CM | POA: Diagnosis not present

## 2019-09-22 DIAGNOSIS — E1165 Type 2 diabetes mellitus with hyperglycemia: Secondary | ICD-10-CM | POA: Diagnosis not present

## 2019-09-22 DIAGNOSIS — F0390 Unspecified dementia without behavioral disturbance: Secondary | ICD-10-CM | POA: Diagnosis present

## 2019-09-22 DIAGNOSIS — Z20828 Contact with and (suspected) exposure to other viral communicable diseases: Secondary | ICD-10-CM | POA: Diagnosis present

## 2019-09-22 LAB — COMPREHENSIVE METABOLIC PANEL
ALT: 22 U/L (ref 0–44)
AST: 17 U/L (ref 15–41)
Albumin: 3.3 g/dL — ABNORMAL LOW (ref 3.5–5.0)
Alkaline Phosphatase: 47 U/L (ref 38–126)
Anion gap: 9 (ref 5–15)
BUN: 62 mg/dL — ABNORMAL HIGH (ref 8–23)
CO2: 25 mmol/L (ref 22–32)
Calcium: 8.7 mg/dL — ABNORMAL LOW (ref 8.9–10.3)
Chloride: 101 mmol/L (ref 98–111)
Creatinine, Ser: 2.12 mg/dL — ABNORMAL HIGH (ref 0.44–1.00)
GFR calc Af Amer: 24 mL/min — ABNORMAL LOW (ref >60)
GFR calc non Af Amer: 21 mL/min — ABNORMAL LOW (ref >60)
Glucose, Bld: 301 mg/dL — ABNORMAL HIGH (ref 70–99)
Potassium: 4.3 mmol/L (ref 3.5–5.1)
Sodium: 135 mmol/L (ref 135–145)
Total Bilirubin: 0.6 mg/dL (ref 0.3–1.2)
Total Protein: 6.4 g/dL — ABNORMAL LOW (ref 6.5–8.1)

## 2019-09-22 LAB — URINALYSIS, ROUTINE W REFLEX MICROSCOPIC
Bacteria, UA: NONE SEEN
Bilirubin Urine: NEGATIVE
Glucose, UA: NEGATIVE mg/dL
Ketones, ur: NEGATIVE mg/dL
Leukocytes,Ua: NEGATIVE
Nitrite: NEGATIVE
Protein, ur: NEGATIVE mg/dL
Specific Gravity, Urine: 1.01 (ref 1.005–1.030)
pH: 5 (ref 5.0–8.0)

## 2019-09-22 LAB — CBC WITH DIFFERENTIAL/PLATELET
Abs Immature Granulocytes: 0.01 10*3/uL (ref 0.00–0.07)
Basophils Absolute: 0 10*3/uL (ref 0.0–0.1)
Basophils Relative: 1 %
Eosinophils Absolute: 0.1 10*3/uL (ref 0.0–0.5)
Eosinophils Relative: 2 %
HCT: 37.6 % (ref 36.0–46.0)
Hemoglobin: 11.2 g/dL — ABNORMAL LOW (ref 12.0–15.0)
Immature Granulocytes: 0 %
Lymphocytes Relative: 13 %
Lymphs Abs: 0.6 10*3/uL — ABNORMAL LOW (ref 0.7–4.0)
MCH: 28 pg (ref 26.0–34.0)
MCHC: 29.8 g/dL — ABNORMAL LOW (ref 30.0–36.0)
MCV: 94 fL (ref 80.0–100.0)
Monocytes Absolute: 0.2 10*3/uL (ref 0.1–1.0)
Monocytes Relative: 5 %
Neutro Abs: 3.4 10*3/uL (ref 1.7–7.7)
Neutrophils Relative %: 79 %
Platelets: 158 10*3/uL (ref 150–400)
RBC: 4 MIL/uL (ref 3.87–5.11)
RDW: 15 % (ref 11.5–15.5)
WBC: 4.3 10*3/uL (ref 4.0–10.5)
nRBC: 0 % (ref 0.0–0.2)

## 2019-09-22 LAB — BRAIN NATRIURETIC PEPTIDE: B Natriuretic Peptide: 1671 pg/mL — ABNORMAL HIGH (ref 0.0–100.0)

## 2019-09-22 MED ORDER — NITROGLYCERIN 2 % TD OINT
1.0000 [in_us] | TOPICAL_OINTMENT | Freq: Once | TRANSDERMAL | Status: AC
  Administered 2019-09-22: 1 [in_us] via TOPICAL
  Filled 2019-09-22: qty 1

## 2019-09-22 MED ORDER — FUROSEMIDE 10 MG/ML IJ SOLN
60.0000 mg | Freq: Once | INTRAMUSCULAR | Status: AC
  Administered 2019-09-23: 60 mg via INTRAVENOUS
  Filled 2019-09-22: qty 6

## 2019-09-22 NOTE — Telephone Encounter (Signed)
Patient's daughter called and is concerned with her mother's blood pressure.  She has taken it twice today.  The first reading was 161/112 and second reading was 145/111.  She saw Dr. Livia Snellen yesterday and the only med change he made was to discontinue Hydralazine.  She is taking Metoprolol, Lisinopril and Ramipril.  They would like to know if they should make any changes at this time.

## 2019-09-22 NOTE — ED Notes (Signed)
[employer] Pacemaker Interrogated 

## 2019-09-22 NOTE — Telephone Encounter (Signed)
Patient's daughter aware to discontinue Ramipril and to increase Lisinopril to 20 mg per day.  Ms. Emami already has a follow up appointment scheduled on 09/27/2019.

## 2019-09-22 NOTE — Telephone Encounter (Signed)
I am uncertain as to why her hydralazine was discontinued when her blood pressure was uncontrolled yesterday.  It is unclear as well as to why she is on 2 different ACE inhibitors.  Would recommend discontinuing the ramipril and increasing her lisinopril 10 mg daily.  Have her start taking 2 of the 10 mg tablets to equal 20 mg daily and I would like her to follow-up in 1 week with her PCP for repeat BMP and blood pressure check in the setting of increase of her ACE inhibitor.

## 2019-09-22 NOTE — ED Triage Notes (Signed)
Daughter that lives with pt reports pt has had generalized weakness with increased bilateral lower edema, sob on exertion and high blood pressure over the last few weeks. PT was on antibiotics last week for UTI but has finished them but now complaining of urinary symptoms again and lower back pain.

## 2019-09-23 ENCOUNTER — Other Ambulatory Visit: Payer: Self-pay

## 2019-09-23 ENCOUNTER — Encounter (HOSPITAL_COMMUNITY): Payer: Self-pay

## 2019-09-23 DIAGNOSIS — I509 Heart failure, unspecified: Secondary | ICD-10-CM

## 2019-09-23 DIAGNOSIS — I5033 Acute on chronic diastolic (congestive) heart failure: Secondary | ICD-10-CM

## 2019-09-23 DIAGNOSIS — I4891 Unspecified atrial fibrillation: Secondary | ICD-10-CM

## 2019-09-23 LAB — HEMOGLOBIN A1C
Hgb A1c MFr Bld: 7.5 % — ABNORMAL HIGH (ref 4.8–5.6)
Mean Plasma Glucose: 168.55 mg/dL

## 2019-09-23 LAB — COMPREHENSIVE METABOLIC PANEL
ALT: 18 U/L (ref 0–44)
AST: 17 U/L (ref 15–41)
Albumin: 3 g/dL — ABNORMAL LOW (ref 3.5–5.0)
Alkaline Phosphatase: 44 U/L (ref 38–126)
Anion gap: 12 (ref 5–15)
BUN: 62 mg/dL — ABNORMAL HIGH (ref 8–23)
CO2: 22 mmol/L (ref 22–32)
Calcium: 8.8 mg/dL — ABNORMAL LOW (ref 8.9–10.3)
Chloride: 104 mmol/L (ref 98–111)
Creatinine, Ser: 1.93 mg/dL — ABNORMAL HIGH (ref 0.44–1.00)
GFR calc Af Amer: 27 mL/min — ABNORMAL LOW (ref >60)
GFR calc non Af Amer: 23 mL/min — ABNORMAL LOW (ref >60)
Glucose, Bld: 289 mg/dL — ABNORMAL HIGH (ref 70–99)
Potassium: 4.5 mmol/L (ref 3.5–5.1)
Sodium: 138 mmol/L (ref 135–145)
Total Bilirubin: 0.6 mg/dL (ref 0.3–1.2)
Total Protein: 5.9 g/dL — ABNORMAL LOW (ref 6.5–8.1)

## 2019-09-23 LAB — GLUCOSE, CAPILLARY
Glucose-Capillary: 285 mg/dL — ABNORMAL HIGH (ref 70–99)
Glucose-Capillary: 264 mg/dL — ABNORMAL HIGH (ref 70–99)
Glucose-Capillary: 254 mg/dL — ABNORMAL HIGH (ref 70–99)
Glucose-Capillary: 202 mg/dL — ABNORMAL HIGH (ref 70–99)
Glucose-Capillary: 109 mg/dL — ABNORMAL HIGH (ref 70–99)

## 2019-09-23 LAB — TROPONIN I (HIGH SENSITIVITY)
Troponin I (High Sensitivity): 16 ng/L (ref <18)
Troponin I (High Sensitivity): 17 ng/L (ref <18)

## 2019-09-23 LAB — CBC
HCT: 34.8 % — ABNORMAL LOW (ref 36.0–46.0)
Hemoglobin: 10.5 g/dL — ABNORMAL LOW (ref 12.0–15.0)
MCH: 27.8 pg (ref 26.0–34.0)
MCHC: 30.2 g/dL (ref 30.0–36.0)
MCV: 92.1 fL (ref 80.0–100.0)
Platelets: 155 10*3/uL (ref 150–400)
RBC: 3.78 MIL/uL — ABNORMAL LOW (ref 3.87–5.11)
RDW: 15 % (ref 11.5–15.5)
WBC: 4.3 10*3/uL (ref 4.0–10.5)
nRBC: 0 % (ref 0.0–0.2)

## 2019-09-23 LAB — SARS CORONAVIRUS 2 (TAT 6-24 HRS): SARS Coronavirus 2: NEGATIVE

## 2019-09-23 LAB — MAGNESIUM: Magnesium: 1.7 mg/dL (ref 1.7–2.4)

## 2019-09-23 MED ORDER — ONDANSETRON HCL 4 MG/2ML IJ SOLN: 4.0000 mg | Freq: Four times a day (QID) | INTRAMUSCULAR | Status: DC | PRN

## 2019-09-23 MED ORDER — CARBOXYMETHYLCELLUL-GLYCERIN 0.5-0.9 % OP SOLN
1.0000 [drp] | Freq: Every day | OPHTHALMIC | Status: DC | PRN
  Filled 2019-09-23: qty 15

## 2019-09-23 MED ORDER — ENSURE ENLIVE PO LIQD
237.0000 mL | Freq: Two times a day (BID) | ORAL | Status: DC
  Administered 2019-09-23 – 2019-09-26 (×5): 237 mL via ORAL

## 2019-09-23 MED ORDER — NYSTATIN 100000 UNIT/GM EX POWD
Freq: Two times a day (BID) | CUTANEOUS | Status: DC
  Administered 2019-09-23 – 2019-09-26 (×7): via TOPICAL
  Filled 2019-09-23: qty 15

## 2019-09-23 MED ORDER — HYDRALAZINE HCL 25 MG PO TABS
25.0000 mg | ORAL_TABLET | Freq: Four times a day (QID) | ORAL | Status: DC | PRN
  Administered 2019-09-23: 25 mg via ORAL
  Filled 2019-09-23: qty 1

## 2019-09-23 MED ORDER — POLYETHYLENE GLYCOL 3350 17 G PO PACK
17.0000 g | PACK | Freq: Every day | ORAL | Status: DC | PRN
  Filled 2019-09-23: qty 1

## 2019-09-23 MED ORDER — FERROUS SULFATE 325 (65 FE) MG PO TABS
325.0000 mg | ORAL_TABLET | Freq: Every morning | ORAL | Status: DC
  Administered 2019-09-23 – 2019-09-26 (×4): 325 mg via ORAL
  Filled 2019-09-23 (×4): qty 1

## 2019-09-23 MED ORDER — SODIUM CHLORIDE 0.9 % IV SOLN: 250.0000 mL | INTRAVENOUS | Status: DC | PRN

## 2019-09-23 MED ORDER — ATORVASTATIN CALCIUM 20 MG PO TABS
20.0000 mg | ORAL_TABLET | Freq: Every day | ORAL | Status: DC
  Administered 2019-09-23 – 2019-09-25 (×3): 20 mg via ORAL
  Filled 2019-09-23 (×3): qty 1

## 2019-09-23 MED ORDER — INSULIN DETEMIR 100 UNIT/ML ~~LOC~~ SOLN
24.0000 [IU] | Freq: Every morning | SUBCUTANEOUS | Status: DC
  Administered 2019-09-23 – 2019-09-26 (×4): 24 [IU] via SUBCUTANEOUS
  Filled 2019-09-23 (×5): qty 0.24

## 2019-09-23 MED ORDER — APIXABAN 2.5 MG PO TABS
2.5000 mg | ORAL_TABLET | Freq: Two times a day (BID) | ORAL | Status: DC
  Administered 2019-09-23 – 2019-09-26 (×7): 2.5 mg via ORAL
  Filled 2019-09-23 (×7): qty 1

## 2019-09-23 MED ORDER — SODIUM CHLORIDE 0.9% FLUSH
3.0000 mL | INTRAVENOUS | Status: DC | PRN
  Administered 2019-09-23: 3 mL via INTRAVENOUS
  Filled 2019-09-23: qty 3

## 2019-09-23 MED ORDER — GLUCERNA SHAKE PO LIQD
237.0000 mL | Freq: Three times a day (TID) | ORAL | Status: DC
  Administered 2019-09-23 – 2019-09-26 (×7): 237 mL via ORAL

## 2019-09-23 MED ORDER — METOPROLOL SUCCINATE ER 50 MG PO TB24
100.0000 mg | ORAL_TABLET | Freq: Every day | ORAL | Status: DC
  Administered 2019-09-23 – 2019-09-26 (×4): 100 mg via ORAL
  Filled 2019-09-23 (×4): qty 2

## 2019-09-23 MED ORDER — INSULIN ASPART 100 UNIT/ML ~~LOC~~ SOLN
0.0000 [IU] | Freq: Three times a day (TID) | SUBCUTANEOUS | Status: DC
  Administered 2019-09-23: 5 [IU] via SUBCUTANEOUS
  Administered 2019-09-23: 3 [IU] via SUBCUTANEOUS
  Administered 2019-09-23: 5 [IU] via SUBCUTANEOUS
  Administered 2019-09-24 – 2019-09-25 (×3): 1 [IU] via SUBCUTANEOUS
  Administered 2019-09-26: 2 [IU] via SUBCUTANEOUS

## 2019-09-23 MED ORDER — SODIUM CHLORIDE 0.9% FLUSH
3.0000 mL | Freq: Two times a day (BID) | INTRAVENOUS | Status: DC
  Administered 2019-09-23 – 2019-09-26 (×5): 3 mL via INTRAVENOUS

## 2019-09-23 MED ORDER — FUROSEMIDE 10 MG/ML IJ SOLN
40.0000 mg | Freq: Two times a day (BID) | INTRAMUSCULAR | Status: DC
  Administered 2019-09-23 – 2019-09-24 (×2): 40 mg via INTRAVENOUS
  Filled 2019-09-23 (×2): qty 4

## 2019-09-23 MED ORDER — PAROXETINE HCL 20 MG PO TABS
20.0000 mg | ORAL_TABLET | Freq: Every morning | ORAL | Status: DC
  Administered 2019-09-23 – 2019-09-26 (×4): 20 mg via ORAL
  Filled 2019-09-23 (×4): qty 1

## 2019-09-23 MED ORDER — ORAL CARE MOUTH RINSE
15.0000 mL | Freq: Two times a day (BID) | OROMUCOSAL | Status: DC
  Administered 2019-09-23 – 2019-09-26 (×6): 15 mL via OROMUCOSAL

## 2019-09-23 MED ORDER — PANTOPRAZOLE SODIUM 40 MG PO TBEC
40.0000 mg | DELAYED_RELEASE_TABLET | Freq: Every day | ORAL | Status: DC
  Administered 2019-09-23 – 2019-09-26 (×4): 40 mg via ORAL
  Filled 2019-09-23 (×4): qty 1

## 2019-09-23 MED ORDER — ONDANSETRON HCL 4 MG PO TABS: 4.0000 mg | ORAL_TABLET | Freq: Four times a day (QID) | ORAL | Status: DC | PRN

## 2019-09-23 NOTE — Progress Notes (Signed)
Patient seen and examined.  Admitted after midnight secondary to generalized weakness, increased lower extremity swelling and findings on work-up demonstrating acute on chronic diastolic heart failure.  She is currently hemodynamically stable, tolerating well IV diuresis and with slight improvement on her renal function.  Please refer to H&P written by Dr. Darrick Meigs on 09/23/2019 for further info/details on admission.  Plan: -Follow low-sodium diet, daily weight and strict I's and O's -Continue IV Lasix -Close follow-up of renal function and electrolytes -Follow clinical response.  Barton Dubois MD [phone]

## 2019-09-23 NOTE — ED Notes (Signed)
Hospitalist at bedside 

## 2019-09-23 NOTE — ED Provider Notes (Signed)
[facility] EMERGENCY DEPARTMENT Provider Note   CSN: [id number] Arrival date & time: 09/22/19  1710   Time seen 11:30 PM  History   Chief Complaint Chief Complaint  Patient presents with  . Weakness   Level 5 caveat for age  HPI [name] is a 85 y.o. female.     HPI daughter who lives with the patient states patient has been getting increasing fluid in her legs over the past 2 weeks.  She told me that her fluid pills were increased from 20 mg twice a day to 40 mg twice a day yesterday however when I review her doctors notes her hydralazine was discontinued and they stopped her ramipril and they increased her lisinopril from 10 mg a day to 20 mg a day.  I do not see any discussion about changing her fluid pills.  She states the patient was admitted a year ago for something similar and they had to take "the fluid off".  She has been short of breath especially in the mornings for the past 6 months.  She denies chest pain.  She also complains of low back pain and abdominal pain for months.  She also states she had a hemorrhoid that her doctor was aware of yesterday.  She has a mild cough and daughter notes she chokes when she drinks fluids.  Daughter denies any fever.  She has been having trouble walking for about 2 days and needs help to ambulate.  Daughter states she is constipated.  She has nausea with dry heaves but no vomiting or diarrhea.  Daughter states they have been eating a lot of canned beans and patient has been eating a lot of chicken noodle soup out of a can.  Daughter states her blood pressure is "jumping too high and too low".  When I asked her what kind a number she states the high number was 175/112 and the low number was 147/112.  Patient recently had a Medtronic pacemaker inserted August 17.  PCP [name], MD Cardiology Dr Alba Cory  Past Medical History:  Diagnosis Date  . Anemia   . Anxiety   . Aortic stenosis   . Arthritis    back, knees, and hips  .  Asthma    with allergies  . Balance problems   . CAD (coronary artery disease)   . Carotid stenosis   . CHF (congestive heart failure) ([place])    EF preserved Echo 2012  . CKD (chronic kidney disease)   . 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 Dementia ([place])    "a little"  . Depression   . Diabetes mellitus    type 2  . GERD (gastroesophageal reflux disease)   . Hypertension   . Iron deficiency anemia 05/27/2011  . Myocardial infarction ([place])   . Renal insufficiency   . Scoliosis   . Vertigo    "when sugar gets low"  . Vision loss    left eye-"half blind"    Patient Active Problem List   Diagnosis Date Noted  . New onset a-fib ([place]) 07/18/2019  . Nocturnal enuresis 07/03/2019  . Mixed stress and urge urinary incontinence 07/03/2019  . Generalized weakness 06/28/2019  . Symptomatic bradycardia 06/28/2019  . Apnea 02/15/2019  . Snoring 02/15/2019  . Polyp of colon   . Accelerated hypertension 01/20/2019  . Rectal bleeding 11/08/2018  . Rectal pain 11/08/2018  . Abnormal CT  scan, colon 11/08/2018  . (HFpEF) heart failure with preserved ejection fraction ([place]) 10/09/2018  . Acute on chronic diastolic CHF (congestive heart failure) ([place]) 10/09/2018  . Aortic stenosis, moderate 10/09/2018  . Mitral valve stenosis 10/09/2018  . Acute exacerbation of CHF (congestive heart failure) ([place]) 10/07/2018  . Acute hypoxemic respiratory failure ([place]) 10/07/2018  . Chronic anemia 10/07/2018  . Asthma 10/07/2018  . CVA (cerebral vascular accident) ([place]) 10/07/2018  . CAD (coronary artery disease) 10/07/2018  . Constipation 10/07/2018  . Physical deconditioning 10/07/2018  . History of recurrent UTIs 10/07/2018  . CKD (chronic kidney disease) stage 3, GFR 30-59 ml/min 12/22/2017  . Dyslipidemia 12/22/2017  . Weakness 11/23/2017  . Overweight (BMI 25.0-29.9) 05/08/2016  . UTI (lower urinary tract  infection) 11/12/2015  . DM hyperosmolarity type II, uncontrolled ([place]) 11/12/2015  . Dementia ([place])   . Encephalopathy, metabolic 63/14/9702  . S/P right TKA 08/13/2015  . S/P knee replacement 08/13/2015  . Elevated troponin 03/06/2015  . Fever   . History of diabetes mellitus   . Left buttock pain   . Mixed hyperlipidemia 01/11/2015  . GAD (generalized anxiety disorder) 01/11/2015  . Depression 01/11/2015  . GERD (gastroesophageal reflux disease) 01/11/2015  . Osteopenia 07/13/2014  . Obesity (BMI 30-39.9) 06/19/2014  . Carotid stenosis   . Anemia of chronic disease 12/25/2011  . Iron deficiency anemia 05/27/2011  . Diastolic CHF, chronic ([place]) 04/02/2010  . Type 2 diabetes mellitus with insulin therapy ([place]) 10/08/2009  . HLD (hyperlipidemia) 10/08/2009  . HTN (hypertension) 10/08/2009  . Coronary atherosclerosis 10/08/2009  . CAROTID STENOSIS 10/08/2009    Past Surgical History:  Procedure Laterality Date  . APPENDECTOMY     with hysterectomy  . CATARACT EXTRACTION Bilateral 5 years ago  . CHOLECYSTECTOMY    . COLONOSCOPY  2005   Dr. Amedeo Plenty: Diverticulosis  . COLONOSCOPY WITH PROPOFOL N/A 02/14/2019   Procedure: COLONOSCOPY WITH PROPOFOL;  Surgeon: Danie Binder, MD;  Location: [facility];  Service: Endoscopy;  Laterality: N/A;  . CORONARY ANGIOPLASTY     prior to 2006 5 stents  . CORONARY ARTERY BYPASS GRAFT  2006  . I & D of [place]  April 2013  . KNEE SURGERY Right   . PACEMAKER IMPLANT N/A 07/24/2019   Procedure: PACEMAKER IMPLANT;  Surgeon: Evans Lance, MD;  Location: [place] [facility];  Service: Cardiovascular;  Laterality: N/A;  . PACEMAKER INSERTION    . POLYPECTOMY  02/14/2019   Procedure: POLYPECTOMY;  Surgeon: Danie Binder, MD;  Location: [facility];  Service: Endoscopy;;  cecal polyp  . TOTAL ABDOMINAL HYSTERECTOMY  ~85 years old   complete, with tumor removal  . TOTAL KNEE ARTHROPLASTY Right 08/13/2015   Procedure: RIGHT  TOTAL KNEE  ARTHROPLASTY;  Surgeon: Paralee Cancel, MD;  Location: [facility];  Service: Orthopedics;  Laterality: Right;     OB History    Gravida  4   Para  4   Term  4   Preterm      AB      Living  3     SAB      TAB      Ectopic      Multiple      Live Births               Home Medications    Prior to Admission medications   Medication Sig Start Date End Date Taking? Authorizing Provider  albuterol (PROVENTIL HFA;VENTOLIN HFA) 108 (90  Base) MCG/ACT inhaler Inhale 2 puffs into the lungs every 6 (six) hours as needed for wheezing or shortness of breath. 01/20/19  Yes Stacks, Cletus Gash, MD  apixaban (ELIQUIS) 2.5 MG TABS tablet Take 1 tablet (2.5 mg total) by mouth 2 (two) times daily. 08/23/19  Yes Minus Breeding, MD  atorvastatin (LIPITOR) 20 MG tablet Take 1 tablet (20 mg total) by mouth at bedtime. 10/18/18  Yes Stacks, Cletus Gash, MD  Carboxymethylcellul-Glycerin (CLEAR EYES FOR DRY EYES) 1-0.25 % SOLN Place 1 drop into both eyes daily as needed (irritation).    Yes [provider]  clotrimazole-betamethasone (LOTRISONE) cream Apply 1 application topically 2 (two) times daily. 09/18/19  Yes Rakes, Connye Burkitt, FNP  esomeprazole (NEXIUM) 40 MG capsule TAKE 1 CAPSULE BY MOUTH ONCE DAILY. Patient taking differently: Take 40 mg by mouth every morning.  09/04/19  Yes [name], MD  ferrous sulfate 324 (65 Fe) MG TBEC Take 324 mg by mouth every morning.    Yes [provider]  furosemide (LASIX) 40 MG tablet Take 2 tablets (80 mg total) by mouth daily. Patient taking differently: Take 40 mg by mouth 2 (two) times daily.  09/20/19  Yes Minus Breeding, MD  insulin lispro (HUMALOG KWIKPEN) 100 UNIT/ML KiwkPen Inject 0.04-0.1 mLs (4-10 Units total) into the skin daily as needed (high blood sugar). Patient taking differently: Inject 5-15 Units into the skin daily as needed (for high blood sugar levels over 250).  07/12/18  Yes Stacks, Cletus Gash, MD  LEVEMIR FLEXTOUCH 100 UNIT/ML Pen  INJECT [place] MORNING Patient taking differently: Inject 24 Units into the skin every morning.  06/19/19  Yes Stacks, Cletus Gash, MD  lisinopril (ZESTRIL) 10 MG tablet Take 1 tablet (10 mg total) by mouth daily. Patient taking differently: Take 10 mg by mouth 2 (two) times daily.  09/15/19  Yes [name], MD  magnesium hydroxide (MILK OF MAGNESIA) 400 MG/5ML suspension Take 30 mLs by mouth daily as needed for mild constipation.   Yes [provider]  metFORMIN (GLUCOPHAGE-XR) 500 MG 24 hr tablet TAKE 1 TABLET 2 TIMES A DAY WITH BREAKFAST & DINNER. Patient taking differently: Take 500 mg by mouth 2 (two) times daily.  07/06/19  Yes Stacks, Cletus Gash, MD  methenamine (HIPREX) 1 g tablet Take 1 g by mouth 2 (two) times daily with a meal.  07/20/19  Yes [provider]  metoprolol succinate (TOPROL-XL) 100 MG 24 hr tablet Take 1 tablet (100 mg total) by mouth daily. For blood pressure control 09/15/19  Yes Stacks, Cletus Gash, MD  Multiple Vitamins-Iron (DAILY VITAMINS/IRON/BETA CAROT PO) Take 1 tablet by mouth at bedtime.   Yes [provider]  nitroGLYCERIN (NITROSTAT) 0.4 MG SL tablet PLACE 1 TAB UNDER TONGUE EVERY 5 MIN UP TO 3 TIMES AS NEEDED FOR CHEST PAIN, IF NO RELIEF CALL 911. Patient taking differently: Place 0.4 mg under the tongue every 5 (five) minutes as needed for chest pain.  09/04/19  Yes Stacks, Cletus Gash, MD  PARoxetine (PAXIL) 20 MG tablet TAKE 1 TABLET BY MOUTH EVERY MORNING. Patient taking differently: Take 20 mg by mouth every morning.  07/06/19  Yes [name], MD  ramipril (ALTACE) 5 MG capsule Take 1 capsule (5 mg total) by mouth daily. 09/06/19  Yes [name], MD  hydrOXYzine (ATARAX/VISTARIL) 50 MG tablet Take 1 tablet (50 mg total) by mouth 3 (three) times daily as needed for nausea (or pain). Patient not taking: Reported on 09/22/2019 09/21/19   [name], MD  metoprolol tartrate (  LOPRESSOR) 50 MG tablet Take 1.5 tablets (75 mg total) by mouth  2 (two) times daily. Patient not taking: Reported on 09/22/2019 08/23/19   Minus Breeding, MD  polyethylene glycol [facility]) packet Take 17 g by mouth daily. Patient not taking: Reported on 09/22/2019 07/25/18   Maudie Flakes, MD    Family History Family History  Problem Relation Age of Onset  . Cancer Brother        porstate  . Early death Sister   . Heart disease Brother   . Heart disease Brother   . Heart attack Brother   . Heart disease Brother   . Heart attack Brother   . Diabetes Sister   . Heart attack Sister   . Diabetes Sister   . Osteoporosis Sister   . Hypertension Sister   . Heart attack Son   . Early death Son   . Pancreatitis Son   . Heart attack Daughter 61    Social History Social History   Tobacco Use  . Smoking status: Never Smoker  . Smokeless tobacco: Never Used  Substance Use Topics  . Alcohol use: No  . Drug use: No  Lives at home Lives with daughter   Allergies   Advil [ibuprofen], Heparin, and Propoxyphene n-acetaminophen   Review of Systems Review of Systems  All other systems reviewed and are negative.    Physical Exam Updated Vital Signs BP (!) 143/107   Pulse 72   Temp 97.8 F (36.6 C) (Oral)   Resp (!) 23   Ht 6' (1.829 m)   Wt 67.1 kg   SpO2 95%   BMI 20.07 kg/m   Physical Exam Vitals signs and nursing note reviewed.  Constitutional:      Appearance: Normal appearance.  HENT:     Head: Normocephalic and atraumatic.     Right Ear: External ear normal.     Left Ear: External ear normal.  Eyes:     Extraocular Movements: Extraocular movements intact.     Conjunctiva/sclera: Conjunctivae normal.     Pupils: Pupils are equal, round, and reactive to light.  Neck:     Musculoskeletal: Normal range of motion.  Cardiovascular:     Rate and Rhythm: Normal rate. Rhythm irregular.     Pulses: Normal pulses.     Heart sounds: Normal heart sounds.  Pulmonary:     Comments: I do not hear any breath sounds in the right  base, there are some breath sounds in the left base.  I do not hear any wheezing, rhonchi, or rales. Abdominal:     General: There is distension.     Palpations: Abdomen is soft.     Tenderness: There is no abdominal tenderness. There is no guarding or rebound.  Musculoskeletal:        General: Swelling present.     Comments: Patient has 2-3+ pitting edema of the dorsum of her feet, she has pitting edema up to her knees bilaterally, the right is slightly worse than the left.  She has had a right total knee replacement done.  Skin:    General: Skin is warm and dry.     Coloration: Skin is pale.  Neurological:     General: No focal deficit present.     Mental Status: She is alert. Mental status is at baseline.     Cranial Nerves: No cranial nerve deficit.  Psychiatric:        Mood and Affect: Mood normal.  Behavior: Behavior normal.        Thought Content: Thought content normal.      ED Treatments / Results  Labs (all labs ordered are listed, but only abnormal results are displayed) Results for orders placed or performed during the hospital encounter of 09/22/19  Urinalysis, Routine w reflex microscopic  Result Value Ref Range   Color, Urine YELLOW YELLOW   APPearance CLEAR CLEAR   Specific Gravity, Urine 1.010 1.005 - 1.030   pH 5.0 5.0 - 8.0   Glucose, UA NEGATIVE NEGATIVE mg/dL   Hgb urine dipstick SMALL (A) NEGATIVE   Bilirubin Urine NEGATIVE NEGATIVE   Ketones, ur NEGATIVE NEGATIVE mg/dL   Protein, ur NEGATIVE NEGATIVE mg/dL   Nitrite NEGATIVE NEGATIVE   Leukocytes,Ua NEGATIVE NEGATIVE   RBC / HPF 0-5 0 - 5 RBC/hpf   WBC, UA 0-5 0 - 5 WBC/hpf   Bacteria, UA NONE SEEN NONE SEEN   Squamous Epithelial / LPF 0-5 0 - 5  CBC with Differential  Result Value Ref Range   WBC 4.3 4.0 - 10.5 K/uL   RBC 4.00 3.87 - 5.11 MIL/uL   Hemoglobin 11.2 (L) 12.0 - 15.0 g/dL   HCT 37.6 36.0 - 46.0 %   MCV 94.0 80.0 - 100.0 fL   MCH 28.0 26.0 - 34.0 pg   MCHC 29.8 (L) 30.0 -  36.0 g/dL   RDW 15.0 11.5 - 15.5 %   Platelets 158 150 - 400 K/uL   nRBC 0.0 0.0 - 0.2 %   Neutrophils Relative % 79 %   Neutro Abs 3.4 1.7 - 7.7 K/uL   Lymphocytes Relative 13 %   Lymphs Abs 0.6 (L) 0.7 - 4.0 K/uL   Monocytes Relative 5 %   Monocytes Absolute 0.2 0.1 - 1.0 K/uL   Eosinophils Relative 2 %   Eosinophils Absolute 0.1 0.0 - 0.5 K/uL   Basophils Relative 1 %   Basophils Absolute 0.0 0.0 - 0.1 K/uL   Immature Granulocytes 0 %   Abs Immature Granulocytes 0.01 0.00 - 0.07 K/uL  Comprehensive metabolic panel  Result Value Ref Range   Sodium 135 135 - 145 mmol/L   Potassium 4.3 3.5 - 5.1 mmol/L   Chloride 101 98 - 111 mmol/L   CO2 25 22 - 32 mmol/L   Glucose, Bld 301 (H) 70 - 99 mg/dL   BUN 62 (H) 8 - 23 mg/dL   Creatinine, Ser 2.12 (H) 0.44 - 1.00 mg/dL   Calcium 8.7 (L) 8.9 - 10.3 mg/dL   Total Protein 6.4 (L) 6.5 - 8.1 g/dL   Albumin 3.3 (L) 3.5 - 5.0 g/dL   AST 17 15 - 41 U/L   ALT 22 0 - 44 U/L   Alkaline Phosphatase 47 38 - 126 U/L   Total Bilirubin 0.6 0.3 - 1.2 mg/dL   GFR calc non Af Amer 21 (L) >60 mL/min   GFR calc Af Amer 24 (L) >60 mL/min   Anion gap 9 5 - 15  Brain natriuretic peptide  Result Value Ref Range   B Natriuretic Peptide 1,671.0 (H) 0.0 - 100.0 pg/mL    Laboratory interpretation all normal except hyperglycemia, mild worsening of prior renal insufficiency (creatinine was 1.67 eight days ago, 1.89 two weeks ago), malnutrition, increasing elevation of her BNP (2 months ago her BNP was 283, 2 weeks ago was 1200)   EKG EKG Interpretation  Date/Time:  Friday September 22 2019 23:40:54 EDT Ventricular Rate:  122 PR Interval:  QRS Duration: 143 QT Interval:  360 QTC Calculation: 513 R Axis:   -176 Text Interpretation:  Atrial fibrillation with rapid ventricular response Nonspecific intraventricular conduction delay Minimal ST depression, diffuse leads Baseline wander No significant change since last tracing 05 Sep 2019 Confirmed by Rolland Porter [phone]) on 09/23/2019 12:21:25 AM   Radiology Dg Chest Port 1 View  Result Date: 09/22/2019 CLINICAL DATA:  85 year old female with shortness of breath. EXAM: PORTABLE CHEST 1 VIEW COMPARISON:  Chest radiograph and CT dated 09/15/2019 FINDINGS: Moderate right pleural effusion similar to prior radiograph and CT. Right lung base density may represent atelectasis or infiltrate. Clinical correlation is recommended. There is no pneumothorax. There is mild cardiomegaly with vascular congestion, new since the prior radiograph. Median sternotomy wires and left pectoral pacemaker device. No acute osseous pathology. Osteopenia. IMPRESSION: 1. Stable moderate right pleural effusion and associated right lung base atelectasis or infiltrate. 2. Cardiomegaly. Mild vascular congestion, new since the prior radiograph. Electronically Signed   By: Anner Crete M.D.   On: 09/22/2019 23:28    Procedures Procedures (including critical care time)        Medications Ordered in ED Medications  furosemide (LASIX) injection 60 mg (60 mg Intravenous Given 09/23/19 0041)  nitroGLYCERIN (NITROGLYN) 2 % ointment 1 inch (1 inch Topical Given 09/22/19 2353)     Initial Impression / Assessment and Plan / ED Course  I have reviewed the triage vital signs and the nursing notes.  Pertinent labs & imaging results that were available during my care of the patient were reviewed by me and considered in my medical decision making (see chart for details).       Patient was given Lasix IV, nitroglycerin paste was placed on her skin.  11:53 PM [employer] tech called about her interrogation of her pacemaker.  She states the pacemaker is functioning normally.  She has had 1 atrial arrhythmia continuously since August 17.  She has had episodes of high ventricular rate.    12:37 AM Dr. Darrick Meigs, hospitalist will admit  Final Clinical Impressions(s) / ED Diagnoses   Final diagnoses:  Acute on chronic congestive heart  failure, unspecified heart failure type [facility])  Atrial fibrillation with rapid ventricular response [facility])    Plan admission  Rolland Porter, MD, Barbette Or, MD 09/23/19 [phone]

## 2019-09-23 NOTE — H&P (Signed)
TRH H&P    Patient Demographics:    [name], is a 85 y.o. female  MRN: [id number]  DOB - 1931-03-25  Admit Date - 09/22/2019  Referring MD/NP/PA: Dr. Tomi Bamberger  Outpatient Primary MD for the patient is [name], MD  Patient coming from: Home  Chief complaint-leg swelling   HPI:    [name]  is a 85 y.o. female, with history of CAD, atrial fibrillation on anticoagulation, aortic stenosis, diastolic heart failure, chronic right pleural effusion, bradycardia, status post pacemaker placement who was brought to the hospital with complaints of worsening leg swelling and generalized weakness.  She was recently seen by cardiology on 09/20/2019 at that time hydralazine was discontinued and Lasix dose was increased.  Patient is a poor historian, daughter provides most of the history at bedside.  As per daughter patient gets out of breath in the morning.  Though she is not requiring oxygen.  She was recently diagnosed with right-sided pleural effusion with possible mass seen on the chest CT.  Patient is supposed to follow-up with for this. In the ED patient was found to be in A. fib with RVR initially, heart rate is controlled at this time.  She also received 60 mg IV Lasix for bilateral lower extremity swelling. Her labs showed creatinine of 2.12, BUN 62.  BNP 1671.  Previous BNP as of 10 and 20 was 1151. Denies chest pain Denies nausea vomiting or diarrhea Complains of constipation Denies abdominal pain   Review of systems:    In addition to the HPI above,    All other systems reviewed and are negative.    Past History of the following :    Past Medical History:  Diagnosis Date  . Anemia   . Anxiety   . Aortic stenosis   . Arthritis    back, knees, and hips  . Asthma    with allergies  . Balance problems   . CAD (coronary artery disease)   . Carotid stenosis   . CHF (congestive heart  failure) ([place])    EF preserved Echo 2012  . CKD (chronic kidney disease)   . 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 Dementia ([place])    "a little"  . Depression   . Diabetes mellitus    type 2  . GERD (gastroesophageal reflux disease)   . Hypertension   . Iron deficiency anemia 05/27/2011  . Myocardial infarction ([place])   . Renal insufficiency   . Scoliosis   . Vertigo    "when sugar gets low"  . Vision loss    left eye-"half blind"      Past Surgical History:  Procedure Laterality Date  . APPENDECTOMY     with hysterectomy  . CATARACT EXTRACTION Bilateral 5 years ago  . CHOLECYSTECTOMY    . COLONOSCOPY  2005   Dr. Amedeo Plenty: Diverticulosis  . COLONOSCOPY WITH PROPOFOL N/A 02/14/2019   Procedure: COLONOSCOPY WITH PROPOFOL;  Surgeon: Danie Binder, MD;  Location: [facility];  Service: Endoscopy;  Laterality: N/A;  . CORONARY ANGIOPLASTY     prior to 2006 5 stents  . CORONARY ARTERY BYPASS GRAFT  2006  . I & D of [place]  April 2013  . KNEE SURGERY Right   . PACEMAKER IMPLANT N/A 07/24/2019   Procedure: PACEMAKER IMPLANT;  Surgeon: Evans Lance, MD;  Location: [facility];  Service: Cardiovascular;  Laterality: N/A;  . PACEMAKER INSERTION    . POLYPECTOMY  02/14/2019   Procedure: POLYPECTOMY;  Surgeon: Danie Binder, MD;  Location: [facility];  Service: Endoscopy;;  cecal polyp  . TOTAL ABDOMINAL HYSTERECTOMY  ~85 years old   complete, with tumor removal  . TOTAL KNEE ARTHROPLASTY Right 08/13/2015   Procedure: RIGHT  TOTAL KNEE ARTHROPLASTY;  Surgeon: Paralee Cancel, MD;  Location: [facility];  Service: Orthopedics;  Laterality: Right;      Social History:      Social History   Tobacco Use  . Smoking status: Never Smoker  . Smokeless tobacco: Never Used  Substance Use Topics  . Alcohol use: No       Family History :     Family History  Problem Relation Age of Onset   . Cancer Brother        porstate  . Early death Sister   . Heart disease Brother   . Heart disease Brother   . Heart attack Brother   . Heart disease Brother   . Heart attack Brother   . Diabetes Sister   . Heart attack Sister   . Diabetes Sister   . Osteoporosis Sister   . Hypertension Sister   . Heart attack Son   . Early death Son   . Pancreatitis Son   . Heart attack Daughter 94      Home Medications:   Prior to Admission medications   Medication Sig Start Date End Date Taking? Authorizing Provider  albuterol (PROVENTIL HFA;VENTOLIN HFA) 108 (90 Base) MCG/ACT inhaler Inhale 2 puffs into the lungs every 6 (six) hours as needed for wheezing or shortness of breath. 01/20/19  Yes Stacks, Cletus Gash, MD  apixaban (ELIQUIS) 2.5 MG TABS tablet Take 1 tablet (2.5 mg total) by mouth 2 (two) times daily. 08/23/19  Yes Minus Breeding, MD  atorvastatin (LIPITOR) 20 MG tablet Take 1 tablet (20 mg total) by mouth at bedtime. 10/18/18  Yes Stacks, Cletus Gash, MD  Carboxymethylcellul-Glycerin (CLEAR EYES FOR DRY EYES) 1-0.25 % SOLN Place 1 drop into both eyes daily as needed (irritation).    Yes [provider]  clotrimazole-betamethasone (LOTRISONE) cream Apply 1 application topically 2 (two) times daily. 09/18/19  Yes Rakes, Connye Burkitt, FNP  esomeprazole (NEXIUM) 40 MG capsule TAKE 1 CAPSULE BY MOUTH ONCE DAILY. Patient taking differently: Take 40 mg by mouth every morning.  09/04/19  Yes [name], MD  ferrous sulfate 324 (65 Fe) MG TBEC Take 324 mg by mouth every morning.    Yes [provider]  furosemide (LASIX) 40 MG tablet Take 2 tablets (80 mg total) by mouth daily. Patient taking differently: Take 40 mg by mouth 2 (two) times daily.  09/20/19  Yes Minus Breeding, MD  insulin lispro (HUMALOG KWIKPEN) 100 UNIT/ML KiwkPen Inject 0.04-0.1 mLs (4-10 Units total) into the skin daily as needed (high blood sugar). Patient taking differently: Inject 5-15 Units into the skin daily  as needed (for high blood sugar levels over 250).  07/12/18  Yes [name], MD  [facility]  FLEXTOUCH 100 UNIT/ML Pen INJECT 24 UNITS EACH MORNING Patient taking differently: Inject 24 Units into the skin every morning.  06/19/19  Yes Stacks, Cletus Gash, MD  lisinopril (ZESTRIL) 10 MG tablet Take 1 tablet (10 mg total) by mouth daily. Patient taking differently: Take 10 mg by mouth 2 (two) times daily.  09/15/19  Yes [name], MD  magnesium hydroxide (MILK OF MAGNESIA) 400 MG/5ML suspension Take 30 mLs by mouth daily as needed for mild constipation.   Yes [provider]  metFORMIN (GLUCOPHAGE-XR) 500 MG 24 hr tablet TAKE 1 TABLET 2 TIMES A DAY WITH BREAKFAST & DINNER. Patient taking differently: Take 500 mg by mouth 2 (two) times daily.  07/06/19  Yes Stacks, Cletus Gash, MD  methenamine (HIPREX) 1 g tablet Take 1 g by mouth 2 (two) times daily with a meal.  07/20/19  Yes [provider]  metoprolol succinate (TOPROL-XL) 100 MG 24 hr tablet Take 1 tablet (100 mg total) by mouth daily. For blood pressure control 09/15/19  Yes Stacks, Cletus Gash, MD  Multiple Vitamins-Iron (DAILY VITAMINS/IRON/BETA CAROT PO) Take 1 tablet by mouth at bedtime.   Yes [provider]  nitroGLYCERIN (NITROSTAT) 0.4 MG SL tablet PLACE 1 TAB UNDER TONGUE EVERY 5 MIN UP TO 3 TIMES AS NEEDED FOR CHEST PAIN, IF NO RELIEF CALL 911. Patient taking differently: Place 0.4 mg under the tongue every 5 (five) minutes as needed for chest pain.  09/04/19  Yes Stacks, Cletus Gash, MD  PARoxetine (PAXIL) 20 MG tablet TAKE 1 TABLET BY MOUTH EVERY MORNING. Patient taking differently: Take 20 mg by mouth every morning.  07/06/19  Yes [name], MD  ramipril (ALTACE) 5 MG capsule Take 1 capsule (5 mg total) by mouth daily. 09/06/19  Yes [name], MD  hydrOXYzine (ATARAX/VISTARIL) 50 MG tablet Take 1 tablet (50 mg total) by mouth 3 (three) times daily as needed for nausea (or pain). Patient not taking: Reported on 09/22/2019  09/21/19   [name], MD  metoprolol tartrate (LOPRESSOR) 50 MG tablet Take 1.5 tablets (75 mg total) by mouth 2 (two) times daily. Patient not taking: Reported on 09/22/2019 08/23/19   Minus Breeding, MD  polyethylene glycol [facility]) packet Take 17 g by mouth daily. Patient not taking: Reported on 09/22/2019 07/25/18   Maudie Flakes, MD     Allergies:     Allergies  Allergen Reactions  . Advil [Ibuprofen] Swelling  . Heparin Other (See Comments)    Confusion, hallucinations.   . Propoxyphene N-Acetaminophen Other (See Comments)    Numbness all over. "floating" sensation.     Physical Exam:   Vitals  Blood pressure (!) 143/107, pulse 72, temperature 97.8 F (36.6 C), temperature source Oral, resp. rate (!) 23, height 6' (1.829 m), weight 67.1 kg, SpO2 95 %.  1.  General: Appears in no acute distress  2. Psychiatric: Alert, oriented x3, intact insight and judgment  3. Neurologic: Cranial nerves II through grossly intact, no focal deficit noted  4. HEENMT:  Atraumatic normocephalic, extraocular muscles are intact  5. Respiratory : Decreased breath sounds in right lung fields  6. Cardiovascular : S1-S2, regular  7. Gastrointestinal:  Abdomen is soft, nontender, no organomegaly      Data Review:    CBC Recent Labs  Lab 09/22/19 1848  WBC 4.3  HGB 11.2*  HCT 37.6  PLT 158  MCV 94.0  MCH 28.0  MCHC 29.8*  RDW 15.0  LYMPHSABS 0.6*  MONOABS 0.2  EOSABS 0.1  BASOSABS 0.0   ------------------------------------------------------------------------------------------------------------------  Results for orders placed or performed during the hospital encounter of 09/22/19 (from the past 48 hour(s))  CBC with Differential     Status: Abnormal   Collection Time: 09/22/19  6:48 PM  Result Value Ref Range   WBC 4.3 4.0 - 10.5 K/uL   RBC 4.00 3.87 - 5.11 MIL/uL   Hemoglobin 11.2 (L) 12.0 - 15.0 g/dL   HCT 37.6 36.0 - 46.0 %   MCV 94.0 80.0 - 100.0 fL    MCH 28.0 26.0 - 34.0 pg   MCHC 29.8 (L) 30.0 - 36.0 g/dL   RDW 15.0 11.5 - 15.5 %   Platelets 158 150 - 400 K/uL   nRBC 0.0 0.0 - 0.2 %   Neutrophils Relative % 79 %   Neutro Abs 3.4 1.7 - 7.7 K/uL   Lymphocytes Relative 13 %   Lymphs Abs 0.6 (L) 0.7 - 4.0 K/uL   Monocytes Relative 5 %   Monocytes Absolute 0.2 0.1 - 1.0 K/uL   Eosinophils Relative 2 %   Eosinophils Absolute 0.1 0.0 - 0.5 K/uL   Basophils Relative 1 %   Basophils Absolute 0.0 0.0 - 0.1 K/uL   Immature Granulocytes 0 %   Abs Immature Granulocytes 0.01 0.00 - 0.07 K/uL    Comment: Performed at [facility], [address] [place], [place] [address]  Comprehensive metabolic panel     Status: Abnormal   Collection Time: 09/22/19  6:48 PM  Result Value Ref Range   Sodium 135 135 - 145 mmol/L   Potassium 4.3 3.5 - 5.1 mmol/L   Chloride 101 98 - 111 mmol/L   CO2 25 22 - 32 mmol/L   Glucose, Bld 301 (H) 70 - 99 mg/dL   BUN 62 (H) 8 - 23 mg/dL   Creatinine, Ser 2.12 (H) 0.44 - 1.00 mg/dL   Calcium 8.7 (L) 8.9 - 10.3 mg/dL   Total Protein 6.4 (L) 6.5 - 8.1 g/dL   Albumin 3.3 (L) 3.5 - 5.0 g/dL   AST 17 15 - 41 U/L   ALT 22 0 - 44 U/L   Alkaline Phosphatase 47 38 - 126 U/L   Total Bilirubin 0.6 0.3 - 1.2 mg/dL   GFR calc non Af Amer 21 (L) >60 mL/min   GFR calc Af Amer 24 (L) >60 mL/min   Anion gap 9 5 - 15    Comment: Performed at [facility], [address], [place], [place] [address]  Brain natriuretic peptide     Status: Abnormal   Collection Time: 09/22/19  6:48 PM  Result Value Ref Range   B Natriuretic Peptide 1,671.0 (H) 0.0 - 100.0 pg/mL    Comment: Performed at [facility], [address], [place], [place] [address]  Urinalysis, Routine w reflex microscopic     Status: Abnormal   Collection Time: 09/22/19  8:54 PM  Result Value Ref Range   Color, Urine YELLOW YELLOW   APPearance CLEAR CLEAR   Specific Gravity, Urine 1.010 1.005 - 1.030   pH 5.0 5.0 - 8.0   Glucose, UA NEGATIVE NEGATIVE mg/dL   Hgb  urine dipstick SMALL (A) NEGATIVE   Bilirubin Urine NEGATIVE NEGATIVE   Ketones, ur NEGATIVE NEGATIVE mg/dL   Protein, ur NEGATIVE NEGATIVE mg/dL   Nitrite NEGATIVE NEGATIVE   Leukocytes,Ua NEGATIVE NEGATIVE   RBC / HPF 0-5 0 - 5 RBC/hpf   WBC, UA 0-5 0 - 5 WBC/hpf   Bacteria, UA NONE SEEN NONE SEEN   Squamous Epithelial /  LPF 0-5 0 - 5    Comment: Performed at [facility], [address], [place], [place] [address]    Chemistries  Recent Labs  Lab 09/22/19 1848  NA 135  K 4.3  CL 101  CO2 25  GLUCOSE 301*  BUN 62*  CREATININE 2.12*  CALCIUM 8.7*  AST 17  ALT 22  ALKPHOS 47  BILITOT 0.6   ------------------------------------------------------------------------------------------------------------------  ------------------------------------------------------------------------------------------------------------------ GFR: Estimated Creatinine Clearance: 20.6 mL/min (A) (by C-G formula based on SCr of 2.12 mg/dL (H)). Liver Function Tests: Recent Labs  Lab 09/22/19 1848  AST 17  ALT 22  ALKPHOS 47  BILITOT 0.6  PROT 6.4*  ALBUMIN 3.3*    --------------------------------------------------------------------------------------------------------------- Urine analysis:    Component Value Date/Time   COLORURINE YELLOW 09/22/2019 2054   APPEARANCEUR CLEAR 09/22/2019 2054   APPEARANCEUR Clear 09/27/2017 1056   LABSPEC 1.010 09/22/2019 2054   PHURINE 5.0 09/22/2019 2054   GLUCOSEU NEGATIVE 09/22/2019 2054   HGBUR SMALL (A) 09/22/2019 2054   BILIRUBINUR NEGATIVE 09/22/2019 2054   BILIRUBINUR Negative 09/27/2017 [place] 09/22/2019 2054   PROTEINUR NEGATIVE 09/22/2019 2054   UROBILINOGEN negative 02/03/2016 1516   UROBILINOGEN 0.2 10/17/2015 1915   NITRITE NEGATIVE 09/22/2019 2054   LEUKOCYTESUR NEGATIVE 09/22/2019 2054      Imaging Results:    Dg Chest Port 1 View  Result Date: 09/22/2019 CLINICAL DATA:  85 year old female with shortness of  breath. EXAM: PORTABLE CHEST 1 VIEW COMPARISON:  Chest radiograph and CT dated 09/15/2019 FINDINGS: Moderate right pleural effusion similar to prior radiograph and CT. Right lung base density may represent atelectasis or infiltrate. Clinical correlation is recommended. There is no pneumothorax. There is mild cardiomegaly with vascular congestion, new since the prior radiograph. Median sternotomy wires and left pectoral pacemaker device. No acute osseous pathology. Osteopenia. IMPRESSION: 1. Stable moderate right pleural effusion and associated right lung base atelectasis or infiltrate. 2. Cardiomegaly. Mild vascular congestion, new since the prior radiograph. Electronically Signed   By: Anner Crete M.D.   On: 09/22/2019 23:28    My personal review of EKG: Rhythm -atrial fibrillation   Assessment & Plan:    Active Problems:   Acute exacerbation of CHF (congestive heart failure) ([place])   1. Bilateral lower extremity swelling/chronic diastolic CHF-patient is not short of breath at this time, not requiring supplemental oxygen.  She has been on p.o. Lasix at home.  Patient received Lasix 60 mg IV in the ED.  She has elevated creatinine 2.12 which is greater than at baseline around 1.9.  Will follow renal function in a.m.  If creatinine improves, would continue with IV Lasix.  2. Atrial fibrillation-heart rate is controlled, continue metoprolol for rate control.  Patient has CHA2DS2VASc score of 8.  Currently on apixaban.  We'll continue with anticoagulation with apixaban.  3. Right pleural effusion with questionable mass-patient has chronic right pleural effusion with questionable mass.  She earlier had CT chest on 09/15/2019 which showed right pleural effusion with possible mass.  She is currently not requiring oxygen.  Continue with Lasix.  She will need thoracentesis at some point for both diagnostic and therapeutic purposes.  4. Diabetes mellitus-continue with Levemir, will start sliding scale  insulin with NovoLog.  5. Acute kidney injury on CKD stage III/IV-patient baseline creatinine is around 1.6 presented with creatinine of 2.12.  Started on IV Lasix as above.  Follow renal function in a.m.    DVT Prophylaxis-  Apixaban  AM Labs Ordered, also please  review Full Orders  Family Communication: Admission, patients condition and plan of care including tests being ordered have been discussed with the patient who indicate understanding and agree with the plan and Code Status.  Code Status: Full code  Admission status: Observation-  Based on patients clinical presentation and evaluation of above clinical data, I have made determination that patient meets Inpatient criteria at this time.  Time spent in minutes : 60 minutes   Oswald Hillock M.D on 09/23/2019 at 3:06 AM

## 2019-09-24 DIAGNOSIS — I2510 Atherosclerotic heart disease of native coronary artery without angina pectoris: Secondary | ICD-10-CM | POA: Diagnosis present

## 2019-09-24 DIAGNOSIS — Z833 Family history of diabetes mellitus: Secondary | ICD-10-CM | POA: Diagnosis not present

## 2019-09-24 DIAGNOSIS — K219 Gastro-esophageal reflux disease without esophagitis: Secondary | ICD-10-CM

## 2019-09-24 DIAGNOSIS — I4891 Unspecified atrial fibrillation: Secondary | ICD-10-CM | POA: Diagnosis not present

## 2019-09-24 DIAGNOSIS — E1165 Type 2 diabetes mellitus with hyperglycemia: Secondary | ICD-10-CM | POA: Diagnosis present

## 2019-09-24 DIAGNOSIS — I130 Hypertensive heart and chronic kidney disease with heart failure and stage 1 through stage 4 chronic kidney disease, or unspecified chronic kidney disease: Secondary | ICD-10-CM | POA: Diagnosis present

## 2019-09-24 DIAGNOSIS — Z794 Long term (current) use of insulin: Secondary | ICD-10-CM | POA: Diagnosis not present

## 2019-09-24 DIAGNOSIS — N179 Acute kidney failure, unspecified: Secondary | ICD-10-CM

## 2019-09-24 DIAGNOSIS — I10 Essential (primary) hypertension: Secondary | ICD-10-CM | POA: Diagnosis not present

## 2019-09-24 DIAGNOSIS — I5033 Acute on chronic diastolic (congestive) heart failure: Secondary | ICD-10-CM | POA: Diagnosis not present

## 2019-09-24 DIAGNOSIS — N184 Chronic kidney disease, stage 4 (severe): Secondary | ICD-10-CM

## 2019-09-24 DIAGNOSIS — E1121 Type 2 diabetes mellitus with diabetic nephropathy: Secondary | ICD-10-CM

## 2019-09-24 DIAGNOSIS — E1122 Type 2 diabetes mellitus with diabetic chronic kidney disease: Secondary | ICD-10-CM | POA: Diagnosis not present

## 2019-09-24 DIAGNOSIS — I252 Old myocardial infarction: Secondary | ICD-10-CM | POA: Diagnosis not present

## 2019-09-24 DIAGNOSIS — F0390 Unspecified dementia without behavioral disturbance: Secondary | ICD-10-CM | POA: Diagnosis present

## 2019-09-24 DIAGNOSIS — K5904 Chronic idiopathic constipation: Secondary | ICD-10-CM | POA: Diagnosis not present

## 2019-09-24 DIAGNOSIS — Z7951 Long term (current) use of inhaled steroids: Secondary | ICD-10-CM | POA: Diagnosis not present

## 2019-09-24 DIAGNOSIS — Z8673 Personal history of transient ischemic attack (TIA), and cerebral infarction without residual deficits: Secondary | ICD-10-CM | POA: Diagnosis not present

## 2019-09-24 DIAGNOSIS — Z951 Presence of aortocoronary bypass graft: Secondary | ICD-10-CM | POA: Diagnosis not present

## 2019-09-24 DIAGNOSIS — Z96651 Presence of right artificial knee joint: Secondary | ICD-10-CM | POA: Diagnosis present

## 2019-09-24 DIAGNOSIS — Z20828 Contact with and (suspected) exposure to other viral communicable diseases: Secondary | ICD-10-CM | POA: Diagnosis present

## 2019-09-24 DIAGNOSIS — E785 Hyperlipidemia, unspecified: Secondary | ICD-10-CM | POA: Diagnosis not present

## 2019-09-24 DIAGNOSIS — Z7901 Long term (current) use of anticoagulants: Secondary | ICD-10-CM | POA: Diagnosis not present

## 2019-09-24 DIAGNOSIS — F419 Anxiety disorder, unspecified: Secondary | ICD-10-CM | POA: Diagnosis present

## 2019-09-24 DIAGNOSIS — Z90710 Acquired absence of both cervix and uterus: Secondary | ICD-10-CM | POA: Diagnosis not present

## 2019-09-24 DIAGNOSIS — I509 Heart failure, unspecified: Secondary | ICD-10-CM

## 2019-09-24 DIAGNOSIS — Z79899 Other long term (current) drug therapy: Secondary | ICD-10-CM | POA: Diagnosis not present

## 2019-09-24 DIAGNOSIS — Z950 Presence of cardiac pacemaker: Secondary | ICD-10-CM | POA: Diagnosis not present

## 2019-09-24 DIAGNOSIS — R0602 Shortness of breath: Secondary | ICD-10-CM | POA: Diagnosis not present

## 2019-09-24 LAB — BASIC METABOLIC PANEL
Anion gap: 11 (ref 5–15)
BUN: 63 mg/dL — ABNORMAL HIGH (ref 8–23)
CO2: 27 mmol/L (ref 22–32)
Calcium: 9.2 mg/dL (ref 8.9–10.3)
Chloride: 104 mmol/L (ref 98–111)
Creatinine, Ser: 1.77 mg/dL — ABNORMAL HIGH (ref 0.44–1.00)
GFR calc Af Amer: 30 mL/min — ABNORMAL LOW (ref >60)
GFR calc non Af Amer: 26 mL/min — ABNORMAL LOW (ref >60)
Glucose, Bld: 69 mg/dL — ABNORMAL LOW (ref 70–99)
Potassium: 3.8 mmol/L (ref 3.5–5.1)
Sodium: 142 mmol/L (ref 135–145)

## 2019-09-24 LAB — GLUCOSE, CAPILLARY
Glucose-Capillary: 70 mg/dL (ref 70–99)
Glucose-Capillary: 149 mg/dL — ABNORMAL HIGH (ref 70–99)
Glucose-Capillary: 143 mg/dL — ABNORMAL HIGH (ref 70–99)
Glucose-Capillary: 111 mg/dL — ABNORMAL HIGH (ref 70–99)

## 2019-09-24 MED ORDER — DILTIAZEM HCL 30 MG PO TABS
30.0000 mg | ORAL_TABLET | Freq: Three times a day (TID) | ORAL | Status: DC
  Administered 2019-09-24 – 2019-09-26 (×8): 30 mg via ORAL
  Filled 2019-09-24 (×8): qty 1

## 2019-09-24 MED ORDER — FUROSEMIDE 10 MG/ML IJ SOLN
40.0000 mg | Freq: Every day | INTRAMUSCULAR | Status: DC
  Administered 2019-09-25: 40 mg via INTRAVENOUS
  Filled 2019-09-24: qty 4

## 2019-09-24 NOTE — Progress Notes (Signed)
Patients heart rate 120-140's.  On-call MD notified via text page.  Orders for Cardizem received and carried out.  Will continue to monitor.

## 2019-09-24 NOTE — Progress Notes (Signed)
PROGRESS NOTE    [name]  AUQ:[id number] DOB: 1931-10-31 DOA: 09/22/2019 PCP: [name], MD     Brief Narrative:  85 y.o. female, with history of CAD, atrial fibrillation on anticoagulation, aortic stenosis, diastolic heart failure, chronic right pleural effusion, bradycardia, status post pacemaker placement who was brought to the hospital with complaints of worsening leg swelling and generalized weakness.  She was recently seen by cardiology on 09/20/2019 at that time hydralazine was discontinued and Lasix dose was increased.  Patient is a poor historian, daughter provides most of the history at bedside.  As per daughter patient gets out of breath in the morning.  Though she is not requiring oxygen.  She was recently diagnosed with right-sided pleural effusion with possible mass seen on the chest CT.  Patient is supposed to follow-up with for this. In the ED patient was found to be in A. fib with RVR initially, heart rate is controlled at this time.  She also received 60 mg IV Lasix for bilateral lower extremity swelling. Her labs showed creatinine of 2.12, BUN 62.  BNP 1671.  Previous BNP as of 10 and 20 was 1151. Denies chest pain Denies nausea vomiting or diarrhea Complains of constipation Denies abdominal pain   Assessment & Plan: 1-acute on chronic diastolic heart failure: -Patient presented with acute shortness of breath, increased lower extremity swelling and generalized weakness. -Good response so IV diuresis -Patient breathing easier and having improvement in lower extremity swelling on physical exam. -No requiring oxygen supplementation -Continue daily weights, strict I's and O's and low-sodium diet. -Follow renal function and electrolytes -Hopefully home in the next 24-48 hours. -Recent echocardiogram from July 2020 demonstrated preserved ejection fraction.  2-atrial fibrillation -Overnight patient with sustained heart rate in the 140s; required the use of  oral Cardizem -Good response and currently with a stable heart rate -Continue Cardizem, continue metoprolol at current dose continue Eliquis for secondary prevention.  3-type 2 diabetes mellitus -Continue Levemir and sliding scale insulin -A1c 7.5 -Modified carbohydrate diet recommended.  4-right pleural effusion with questionable mass -No requiring oxygen -Questionable equal presentation secondary to interstitial edema and vascular congestion -Continue IV Lasix -Repeat x-rays in the morning and discuss about the need of thoracentesis.  5-physical deconditioning -Physical therapy has been ordered to evaluate and provide recommendations.  6-acute kidney injury and chronic kidney disease is stage IV at baseline -Creatinine improving with diuresis -Continue follow renal function trend and electrolytes -Continue current diuresis; worsening renal function most likely in the setting of poor perfusion with CHF exacerbation.  7-depression/anxiety -Mood is a stable at this time -No suicidal ideation or hallucination -Continue the use of Paxil.  8-GERD -Continue PPI  9-hyperlipidemia -Continue statins.   DVT prophylaxis: Eliquis Code Status: Full code. Family Communication: No family at bedside. Disposition Plan: Remains inpatient, continue IV diuresis, follow renal function and electrolytes.  Hopefully discharge home in the next 24 to 48 hours.  As physical therapy for evaluation and recommendations.  Consultants:   None  Procedures:   See below for x-ray reports.  Antimicrobials:  Anti-infectives (From admission, onward)   None       Subjective: No chest pain, no fever, no nausea, no vomiting.  Reports breathing is improving.  Still short of breath on exertion and overnight with elevated heart rate in the 120s to 140s sustained; patient required to be started on oral Cardizem.  Patient reports improved urine output.  Objective: Vitals:   09/23/19 2146 09/23/19 2147  09/24/19 0400  09/24/19 0501  BP: (!) 167/147 (!) 148/94  (!) 124/91  Pulse: (!) 108 64  93  Resp:  19  19  Temp: (!) 97.5 F (36.4 C)   97.7 F (36.5 C)  TempSrc: Oral   Oral  SpO2: 96% 97%  97%  Weight:   70.6 kg   Height:        Intake/Output Summary (Last 24 hours) at 09/24/2019 1340 Last data filed at 09/24/2019 0903 Gross per 24 hour  Intake 480 ml  Output 1600 ml  Net -1120 ml   Filed Weights   09/22/19 1809 09/23/19 0425 09/24/19 0400  Weight: 67.1 kg 70.2 kg 70.6 kg    Examination: General exam: Alert, awake, oriented x 2; denies chest pain or palpitations.  Reports breathing getting better.  Still mild short of breath with activity and having lower extremity swelling.  Patient expressed feeling weak and tired. Respiratory system: No wheezing, good oxygen saturation on room air; fine crackles at the bases.  Positive rhonchi. Cardiovascular system: Rate controlled, no rubs, no gallops, no JVD on exam.  Positive systolic ejection murmur. Gastrointestinal system: Abdomen is nondistended, soft and nontender. No organomegaly or masses felt. Normal bowel sounds heard. Central nervous system: Alert and oriented x2 as mentioned above. No focal neurological deficits.  Moving 4 limbs spontaneously. Extremities: No cyanosis or clubbing; 1+ edema lower extremity bilateral.  TED hoses in place. Skin: No rashes, no petechiae. Psychiatry: Mood & affect appropriate.     Data Reviewed: I have personally reviewed following labs and imaging studies  CBC: Recent Labs  Lab 09/22/19 1848 09/23/19 0729  WBC 4.3 4.3  NEUTROABS 3.4  --   HGB 11.2* 10.5*  HCT 37.6 34.8*  MCV 94.0 92.1  PLT 158 765   Basic Metabolic Panel: Recent Labs  Lab 09/22/19 1848 09/23/19 0635 09/23/19 0729 09/24/19 0658  NA 135 138  --  142  K 4.3 4.5  --  3.8  CL 101 104  --  104  CO2 25 22  --  27  GLUCOSE 301* 289*  --  69*  BUN 62* 62*  --  63*  CREATININE 2.12* 1.93*  --  1.77*  CALCIUM 8.7*  8.8*  --  9.2  MG  --   --  1.7  --    GFR: Estimated Creatinine Clearance: 21.8 mL/min (A) (by C-G formula based on SCr of 1.77 mg/dL (H)).   Liver Function Tests: Recent Labs  Lab 09/22/19 1848 09/23/19 0635  AST 17 17  ALT 22 18  ALKPHOS 47 44  BILITOT 0.6 0.6  PROT 6.4* 5.9*  ALBUMIN 3.3* 3.0*   HbA1C: Recent Labs    09/23/19 0635  HGBA1C 7.5*   CBG: Recent Labs  Lab 09/23/19 1120 09/23/19 1613 09/23/19 2144 09/24/19 0725 09/24/19 1114  GLUCAP 254* 202* 109* 70 149*   Urine analysis:    Component Value Date/Time   COLORURINE YELLOW 09/22/2019 2054   APPEARANCEUR CLEAR 09/22/2019 2054   APPEARANCEUR Clear 09/27/2017 1056   LABSPEC 1.010 09/22/2019 2054   PHURINE 5.0 09/22/2019 2054   GLUCOSEU NEGATIVE 09/22/2019 2054   HGBUR SMALL (A) 09/22/2019 2054   BILIRUBINUR NEGATIVE 09/22/2019 2054   BILIRUBINUR Negative 09/27/2017 [place] 09/22/2019 2054   PROTEINUR NEGATIVE 09/22/2019 2054   UROBILINOGEN negative 02/03/2016 1516   UROBILINOGEN 0.2 10/17/2015 1915   NITRITE NEGATIVE 09/22/2019 2054   LEUKOCYTESUR NEGATIVE 09/22/2019 2054    Recent Results (from the past 240  hour(s))  SARS CORONAVIRUS 2 (TAT 6-24 HRS) Nasopharyngeal Nasopharyngeal Swab     Status: None   Collection Time: 09/23/19  3:40 AM   Specimen: Nasopharyngeal Swab  Result Value Ref Range Status   SARS Coronavirus 2 NEGATIVE NEGATIVE Final    Comment: (NOTE) SARS-CoV-2 target nucleic acids are NOT DETECTED. The SARS-CoV-2 RNA is generally detectable in upper and lower respiratory specimens during the acute phase of infection. Negative results do not preclude SARS-CoV-2 infection, do not rule out co-infections with other pathogens, and should not be used as the sole basis for treatment or other patient management decisions. Negative results must be combined with clinical observations, patient history, and epidemiological information. The expected result is Negative.  Fact Sheet for Patients: SugarRoll.be Fact Sheet for Healthcare Providers: https://www.woods-mathews.com/ This test is not yet approved or cleared by the [place] FDA and  has been authorized for detection and/or diagnosis of SARS-CoV-2 by FDA under an Emergency Use Authorization (EUA). This EUA will remain  in effect (meaning this test can be used) for the duration of the COVID-19 declaration under Section 56 4(b)(1) of the Act, 21 U.S.C. section 360bbb-3(b)(1), unless the authorization is terminated or revoked sooner. Performed at [facility], [place] [address] [place], [place] [address]      Radiology Studies: Dg Chest Port 1 View  Result Date: 09/22/2019 CLINICAL DATA:  85 year old female with shortness of breath. EXAM: PORTABLE CHEST 1 VIEW COMPARISON:  Chest radiograph and CT dated 09/15/2019 FINDINGS: Moderate right pleural effusion similar to prior radiograph and CT. Right lung base density may represent atelectasis or infiltrate. Clinical correlation is recommended. There is no pneumothorax. There is mild cardiomegaly with vascular congestion, new since the prior radiograph. Median sternotomy wires and left pectoral pacemaker device. No acute osseous pathology. Osteopenia. IMPRESSION: 1. Stable moderate right pleural effusion and associated right lung base atelectasis or infiltrate. 2. Cardiomegaly. Mild vascular congestion, new since the prior radiograph. Electronically Signed   By: Anner Crete M.D.   On: 09/22/2019 23:28    Scheduled Meds: . apixaban  2.5 mg Oral BID  . atorvastatin  20 mg Oral QHS  . diltiazem  30 mg Oral Q8H  . feeding supplement (ENSURE ENLIVE)  237 mL Oral BID BM  . feeding supplement (GLUCERNA SHAKE)  237 mL Oral TID BM  . ferrous sulfate  325 mg Oral q morning - 10a  . furosemide  40 mg Intravenous Q12H  . insulin aspart  0-9 Units Subcutaneous TID WC  . insulin detemir  24 Units Subcutaneous q  morning - 10a  . mouth rinse  15 mL Mouth Rinse BID  . metoprolol succinate  100 mg Oral Daily  . nystatin   Topical BID  . pantoprazole  40 mg Oral Daily  . PARoxetine  20 mg Oral q morning - 10a  . sodium chloride flush  3 mL Intravenous Q12H   Continuous Infusions: . sodium chloride       LOS: 0 days    Time spent: 30 minutes.   Barton Dubois, MD [facility] Pager [phone]   09/24/2019, 1:40 PM

## 2019-09-25 DIAGNOSIS — K5904 Chronic idiopathic constipation: Secondary | ICD-10-CM

## 2019-09-25 DIAGNOSIS — R0602 Shortness of breath: Secondary | ICD-10-CM

## 2019-09-25 LAB — BASIC METABOLIC PANEL
Anion gap: 9 (ref 5–15)
BUN: 56 mg/dL — ABNORMAL HIGH (ref 8–23)
CO2: 29 mmol/L (ref 22–32)
Calcium: 9.2 mg/dL (ref 8.9–10.3)
Chloride: 105 mmol/L (ref 98–111)
Creatinine, Ser: 1.6 mg/dL — ABNORMAL HIGH (ref 0.44–1.00)
GFR calc Af Amer: 34 mL/min — ABNORMAL LOW (ref >60)
GFR calc non Af Amer: 29 mL/min — ABNORMAL LOW (ref >60)
Glucose, Bld: 74 mg/dL (ref 70–99)
Potassium: 3.9 mmol/L (ref 3.5–5.1)
Sodium: 143 mmol/L (ref 135–145)

## 2019-09-25 LAB — GLUCOSE, CAPILLARY
Glucose-Capillary: 103 mg/dL — ABNORMAL HIGH (ref 70–99)
Glucose-Capillary: 90 mg/dL (ref 70–99)
Glucose-Capillary: 148 mg/dL — ABNORMAL HIGH (ref 70–99)
Glucose-Capillary: 136 mg/dL — ABNORMAL HIGH (ref 70–99)

## 2019-09-25 MED ORDER — FUROSEMIDE 40 MG PO TABS
40.0000 mg | ORAL_TABLET | Freq: Two times a day (BID) | ORAL | Status: DC
  Administered 2019-09-26: 40 mg via ORAL
  Filled 2019-09-25 (×2): qty 1

## 2019-09-25 MED ORDER — ACETAMINOPHEN 325 MG PO TABS: 650.0000 mg | ORAL_TABLET | Freq: Four times a day (QID) | ORAL | Status: DC | PRN

## 2019-09-25 MED ORDER — OXYCODONE HCL 5 MG PO TABS: 5.0000 mg | ORAL_TABLET | Freq: Four times a day (QID) | ORAL | Status: DC | PRN

## 2019-09-25 MED ORDER — POLYETHYLENE GLYCOL 3350 17 G PO PACK
17.0000 g | PACK | Freq: Two times a day (BID) | ORAL | Status: DC
  Administered 2019-09-26: 17 g via ORAL
  Filled 2019-09-25: qty 1

## 2019-09-25 MED ORDER — BISACODYL 10 MG RE SUPP: 10.0000 mg | Freq: Once | RECTAL | Status: DC

## 2019-09-25 MED ORDER — DOCUSATE SODIUM 100 MG PO CAPS
100.0000 mg | ORAL_CAPSULE | Freq: Two times a day (BID) | ORAL | Status: DC
  Administered 2019-09-25 – 2019-09-26 (×2): 100 mg via ORAL
  Filled 2019-09-25 (×2): qty 1

## 2019-09-25 NOTE — TOC Progression Note (Signed)
Viewing 1 - 6 of 6 results  Medicare.gov - the Conservation officer, historic buildings for [address] health agencies that serve [place], [place].  Choose up to 3 home health agencies to compare.  So far you have none selected.  Compare now Viewing 1 - 6 of 6 results FirstPreviousNextLast Results list table [place] InformationSorted ascending, Select to sort descending  Quality of Patient Care RatingSelect to sort ascending or descending Quality of Patient Care Rating Contextual Help  Patient Survey Summary RatingSelect to sort ascending or descending QPatient Survey Summary Rating Contextual Help  [facility] [phone]  [place] my Favorites Quality of Patient Care Rating 4 out of 5 stars Patient Survey Summary Rating 4 out of [place] [phone]) [phone]  [place] my Favorites Quality of Patient Care Rating 4  out of 5 stars Patient Survey Summary Rating 3 out of 5 stars [place] [phone]  [place], [employer] my Favorites Quality of Patient Care Rating 4 out of 5 stars Patient Survey Summary Rating 4 out of 5 stars [place] [phone]  [place] my Favorites Quality of Patient Care Rating 4 out of 5 stars Patient Survey Summary Rating 4 out of 5 stars [facility] [place] [phone]  Add [facility] [place] my Favorites Quality of Patient Care Rating 3  out of 5 stars Patient Survey Summary Rating 4 out of 5 stars [place] [phone]  [place] my Favorites Quality of Patient Care Rating 3 out of 5 stars Patient Survey Summary Rating 4 out of 5 stars Viewing 1 - 6 of 6 results FirstPreviousNextLast  Choose up to 3 home health agencies to compare.  So far you have none  selected.  Compare now Modify your search To change any of the search criteria in this panel and see the results, you must select the update results button after making your changes Location Home health agencies that serve:  ZIP code or [place], State Location [place], [place] a [place] name Agency name Full or partial name Land by: Clear all filters Quality of patient care rating Learn more about these ratings - Opens in a new window  5 stars (0)  4 ? 4.5 stars (4)  3 ? 3.5 stars (2)  2 ? 2.5 stars (0)  1 ? 1.5 stars (0) Patient survey summary ratings Learn more about these ratings - Opens in a new window       Rating: 5 out of 5 (0)        Rating: 4 out of 5 (5)        Rating: 3 out of 5 (1)        Rating: 2 out of 5 (0)        Rating: 1 out of 5 (0)

## 2019-09-25 NOTE — Telephone Encounter (Signed)
Pt. Subsequently admitted to hospital for dyspnea, edema. Will hold off on therapeutic decisions until DC.

## 2019-09-25 NOTE — TOC Initial Note (Signed)
Transition of Care [facility]) - Initial/Assessment Note    Patient Details  Name: [name] MRN: [id number] Date of Birth: Jul 07, 1931  Transition of Care [facility]) CM/SW Contact:    Shade Flood, LCSW Phone Number: 09/25/2019, 12:29 PM  Clinical Narrative:                  Pt admitted from home. She is high risk for readmission. Met with pt and her daughter this AM to assess. Pt and dtr live together in their home in [place]. Dtr assists with ADLs as necessary. Pt has walker but daughter is asking about a "stand up walker with higher arms". Pt also has a BSC. Dtr working on trying to get her a PureWick.   PT recommending HH PT at dc. Discussed recommendation and CMS choices for [facility] with pt/dtr and they request [facility] as pt has had them three times in the past. [facility] who states that they will accept.  Per dtr, pt is able to get to appointments and obtain medications without difficulty. Pt has an appointment with her PCP already established on 09/27/19. Anticipating dc today or tomorrow.  Updated MD. Will follow and assist with any TOC needs.  Expected Discharge Plan: [place] Barriers to Discharge: No Barriers Identified   Patient Goals and CMS Choice   CMS Medicare.gov Compare Post Acute Care list provided to:: Patient Represenative (must comment) Choice offered to / list presented to : Adult Children  Expected Discharge Plan and Services Expected Discharge Plan: [place] In-house Referral: Clinical Social Work   Post Acute Care Choice: Museum/gallery conservator, Home Health Living arrangements for the past 2 months: [place]: PT [place]: [facility] [employer]) Date [place]: 09/25/19 Time [place]: 45 Representative spoke with at [place]: [place] Arrangements/Services Living arrangements  for the past 2 months: [place] Lives with:: Adult Children Patient language and need for interpreter reviewed:: Yes Do you feel safe going back to the place where you live?: Yes      Need for Family Participation in Patient Care: Yes (Comment) Care giver support system in place?: Yes (comment) Current home services: DME Criminal Activity/Legal Involvement Pertinent to Current Situation/Hospitalization: No - Comment as needed  Activities of Daily Living Home Assistive Devices/Equipment: Dentures (specify type), Eyeglasses, Blood pressure cuff, Bedside commode/3-in-1, Walker (specify type), CBG Meter, Oxygen(upper & lower dentures) ADL Screening (condition at time of admission) Patient's cognitive ability adequate to safely complete daily activities?: Yes Is the patient deaf or have difficulty hearing?: Yes Does the patient have difficulty seeing, even when wearing glasses/contacts?: Yes Does the patient have difficulty concentrating, remembering, or making decisions?: Yes Patient able to express need for assistance with ADLs?: Yes Does the patient have difficulty dressing or bathing?: Yes Independently performs ADLs?: No Communication: Independent Dressing (OT): Needs assistance Is this a change from baseline?: Change from baseline, expected to last <3days Grooming: Needs assistance Is this a change from baseline?: Change from baseline, expected to last <3 days Feeding: Independent Bathing: Needs assistance Is this a change from baseline?: Pre-admission baseline Toileting: Needs assistance Is this a change from baseline?: Pre-admission baseline In/Out Bed: Needs assistance Is this a change from baseline?: Pre-admission  baseline Walks in Home: Independent with device (comment)(walker) Is this a change from baseline?: Pre-admission baseline Does the patient have difficulty walking or climbing stairs?: Yes Weakness of Legs: Both Weakness of Arms/Hands: None  Permission  Sought/Granted Permission sought to share information with : Investment banker, corporate granted to share info w AGENCY: [facility]        Emotional Assessment Appearance:: Appears stated age Attitude/Demeanor/Rapport: Engaged Affect (typically observed): Pleasant Orientation: : Oriented to Self, Oriented to Place, Oriented to  Time, Oriented to Situation Alcohol / Substance Use: Not Applicable Psych Involvement: No (comment)  Admission diagnosis:  Atrial fibrillation with rapid ventricular response (HCC) [I48.91] Acute on chronic congestive heart failure, unspecified heart failure type [facility]) [I50.9] Patient Active Problem List   Diagnosis Date Noted  . CHF (congestive heart failure) ([place]) 09/24/2019  . New onset a-fib ([place]) 07/18/2019  . Nocturnal enuresis 07/03/2019  . Mixed stress and urge urinary incontinence 07/03/2019  . Generalized weakness 06/28/2019  . Symptomatic bradycardia 06/28/2019  . Apnea 02/15/2019  . Snoring 02/15/2019  . Polyp of colon   . Accelerated hypertension 01/20/2019  . Rectal bleeding 11/08/2018  . Rectal pain 11/08/2018  . Abnormal CT scan, colon 11/08/2018  . (HFpEF) heart failure with preserved ejection fraction ([place]) 10/09/2018  . Acute on chronic diastolic CHF (congestive heart failure) ([place]) 10/09/2018  . Aortic stenosis, moderate 10/09/2018  . Mitral valve stenosis 10/09/2018  . Acute exacerbation of CHF (congestive heart failure) ([place]) 10/07/2018  . Acute hypoxemic respiratory failure ([place]) 10/07/2018  . Chronic anemia 10/07/2018  . Asthma 10/07/2018  . CVA (cerebral vascular accident) ([place]) 10/07/2018  . CAD (coronary artery disease) 10/07/2018  . Constipation 10/07/2018  . Physical deconditioning 10/07/2018  . History of recurrent UTIs 10/07/2018  . CKD (chronic kidney disease) stage 3, GFR 30-59 ml/min 12/22/2017  . Dyslipidemia 12/22/2017  . Weakness 11/23/2017  . Overweight (BMI 25.0-29.9)  05/08/2016  . UTI (lower urinary tract infection) 11/12/2015  . DM hyperosmolarity type II, uncontrolled ([place]) 11/12/2015  . Dementia ([place])   . Encephalopathy, metabolic 81/12/7508  . S/P right TKA 08/13/2015  . S/P knee replacement 08/13/2015  . Elevated troponin 03/06/2015  . Fever   . History of diabetes mellitus   . Left buttock pain   . Mixed hyperlipidemia 01/11/2015  . GAD (generalized anxiety disorder) 01/11/2015  . Depression 01/11/2015  . GERD (gastroesophageal reflux disease) 01/11/2015  . Osteopenia 07/13/2014  . Obesity (BMI 30-39.9) 06/19/2014  . Carotid stenosis   . Anemia of chronic disease 12/25/2011  . Iron deficiency anemia 05/27/2011  . Diastolic CHF, chronic ([place]) 04/02/2010  . Type 2 diabetes mellitus with insulin therapy ([place]) 10/08/2009  . HLD (hyperlipidemia) 10/08/2009  . HTN (hypertension) 10/08/2009  . Coronary atherosclerosis 10/08/2009  . CAROTID STENOSIS 10/08/2009   PCP:  [name], MD Pharmacy:   [facility], [place] [address] [place] [address] Phone: [phone] Fax: [place], [place] [address] Phone: [phone] Fax: [phone]     Social Determinants of Health (SDOH) Interventions    Readmission Risk Interventions Readmission Risk Prevention Plan 09/25/2019  Transportation Screening Complete  Medication Review ([place]) Complete  PCP or Specialist appointment within 3-5 days of discharge Complete  HRI or Home Care Consult Complete  SW Recovery Care/Counseling Consult Complete  Palliative Care Screening Not Applicable  Skilled Nursing Facility Not Applicable  Some recent data might be hidden

## 2019-09-25 NOTE — Evaluation (Signed)
Physical Therapy Evaluation Patient Details Name: [name] MRN: [id number] DOB: 1931-11-26 Today's Date: 09/25/2019   History of Present Illness  [name]  is a 85 y.o. female, with history of CAD, atrial fibrillation on anticoagulation, aortic stenosis, diastolic heart failure, chronic right pleural effusion, bradycardia, status post pacemaker placement who was brought to the hospital with complaints of worsening leg swelling and generalized weakness.  She was recently seen by cardiology on 09/20/2019 at that time hydralazine was discontinued and Lasix dose was increased.  Patient is a poor historian, daughter provides most of the history at bedside.  As per daughter patient gets out of breath in the morning.  Though she is not requiring oxygen.  She was recently diagnosed with right-sided pleural effusion with possible mass seen on the chest CT.  Patient is supposed to follow-up with for this.    Clinical Impression  Patient limited for functional mobility as stated below secondary to BLE weakness, fatigue and poor standing balance. Patient requires frequent verbal cueing for sequencing of bed mobility and transfers and requires mod assist with transfer to chair. Patient's daughter arrived during session and stated she would be available to assist patient 24/7 at home. Patient left in chair with daughter present - RN aware.  Patient will benefit from continued physical therapy in hospital and recommended venue below to increase strength, balance, endurance for safe ADLs and gait.     Follow Up Recommendations Home health PT;Supervision/Assistance - 24 hour;Supervision for mobility/OOB    Equipment Recommendations  Rolling walker with 5" wheels    Recommendations for Other Services       Precautions / Restrictions Precautions Precautions: Fall Restrictions Weight Bearing Restrictions: No      Mobility  Bed Mobility Overal bed mobility: Modified Independent              General bed mobility comments: requires moderate verbal cueing to transition to seated, slow, labored  Transfers Overall transfer level: Needs assistance Equipment used: Rolling walker (2 wheeled) Transfers: Sit to/from [facility] Sit to Stand: Mod assist Stand pivot transfers: Mod assist       General transfer comment: using RW, requires mod assist and frequent verbal cueing for sequencing, slow, labored  Ambulation/Gait                Stairs            Wheelchair Mobility    Modified Rankin (Stroke Patients Only)       Balance Overall balance assessment: Needs assistance Sitting-balance support: Feet supported;Bilateral upper extremity supported Sitting balance-Leahy Scale: Fair Sitting balance - Comments: at bedside     Standing balance-Leahy Scale: Poor Standing balance comment: requires mod assit and RW to maintain standing                             Pertinent Vitals/Pain Pain Assessment: Faces Faces Pain Scale: Hurts even more Pain Location: stomach and low back Pain Descriptors / Indicators: Aching Pain Intervention(s): Monitored during session    Home Living Family/patient expects to be discharged to:: Private residence Living Arrangements: Children Available Help at Discharge: Family Type of Home: House Home Access: Stairs to enter   Technical brewer of Steps: 2 - son is building her a ramp Home Layout: One level Home Equipment: Environmental consultant - 2 wheels;Bedside commode;Hospital bed Additional Comments: patient reports she has a RW but appears to be poor historian    Prior Function  Level of Independence: Needs assistance   Gait / Transfers Assistance Needed: limited household ambulator with walker with assit  ADL's / Homemaking Assistance Needed: daughter assists        Hand Dominance        Extremity/Trunk Assessment   Upper Extremity Assessment Upper Extremity Assessment: Generalized  weakness    Lower Extremity Assessment Lower Extremity Assessment: Generalized weakness       Communication   Communication: Expressive difficulties  Cognition Arousal/Alertness: Awake/alert Behavior During Therapy: WFL for tasks assessed/performed Overall Cognitive Status: Within Functional Limits for tasks assessed                                        General Comments      Exercises     Assessment/Plan    PT Assessment Patient needs continued PT services  PT Problem List Decreased strength;Decreased activity tolerance;Decreased balance;Decreased mobility       PT Treatment Interventions DME instruction;Gait training;Functional mobility training;Therapeutic activities;Therapeutic exercise;Balance training;Patient/family education    PT Goals (Current goals can be found in the Care Plan section)  Acute Rehab PT Goals Patient Stated Goal: Go home with daughter PT Goal Formulation: With patient/family Time For Goal Achievement: 10/09/19 Potential to Achieve Goals: Good    Frequency Min 3X/week   Barriers to discharge        Co-evaluation               AM-PAC PT "6 Clicks" Mobility  Outcome Measure Help needed turning from your back to your side while in a flat bed without using bedrails?: A Little Help needed moving from lying on your back to sitting on the side of a flat bed without using bedrails?: A Little Help needed moving to and from a bed to a chair (including a wheelchair)?: A Lot Help needed standing up from a chair using your arms (e.g., wheelchair or bedside chair)?: A Lot Help needed to walk in hospital room?: A Lot Help needed climbing 3-5 steps with a railing? : Total 6 Click Score: 13    End of Session   Activity Tolerance: Patient tolerated treatment well;Patient limited by fatigue Patient left: in chair;with nursing/sitter in room;with call bell/phone within reach;with family/visitor present Nurse Communication:  Mobility status PT Visit Diagnosis: Unsteadiness on feet (R26.81);Muscle weakness (generalized) (M62.81);Other abnormalities of gait and mobility (R26.89)    Time: 1020-1101 PT Time Calculation (min) (ACUTE ONLY): 41 min   Charges:   PT Evaluation $PT Eval Moderate Complexity: 1 Mod PT Treatments $Therapeutic Activity: 23-37 mins        12:13 PM, 09/25/19 Mearl Latin PT, DPT Physical Therapist at [facility]

## 2019-09-25 NOTE — Plan of Care (Signed)
  Problem: Acute Rehab PT Goals(only PT should resolve) Goal: Patient Will Transfer Sit To/From Stand 09/25/2019 1216 by Mearl Latin, PT Outcome: Progressing 09/25/2019 1215 by Mearl Latin, PT Outcome: Progressing Flowsheets (Taken 09/25/2019 1215) Patient will transfer sit to/from stand:  with modified independence  with minimal assist Goal: Pt Will Transfer Bed To Chair/Chair To Bed 09/25/2019 1216 by Mearl Latin, PT Outcome: Progressing 09/25/2019 1215 by Mearl Latin, PT Outcome: Progressing Flowsheets (Taken 09/25/2019 1215) Pt will Transfer Bed to Chair/Chair to Bed:  with modified independence  with min assist Goal: Pt Will Ambulate 09/25/2019 1216 by Mearl Latin, PT Outcome: Progressing 09/25/2019 1215 by Mearl Latin, PT Flowsheets (Taken 09/25/2019 1215) Pt will Ambulate:  25 feet  with modified independence  with minimal assist  with rolling walker  12:17 PM, 09/25/19 Mearl Latin PT, DPT Physical Therapist at [facility]

## 2019-09-25 NOTE — Progress Notes (Signed)
PROGRESS NOTE    [name]  QIO:[id number] DOB: October 01, 1931 DOA: 09/22/2019 PCP: [name], MD     Brief Narrative:  85 y.o. female, with history of CAD, atrial fibrillation on anticoagulation, aortic stenosis, diastolic heart failure, chronic right pleural effusion, bradycardia, status post pacemaker placement who was brought to the hospital with complaints of worsening leg swelling and generalized weakness.  She was recently seen by cardiology on 09/20/2019 at that time hydralazine was discontinued and Lasix dose was increased.  Patient is a poor historian, daughter provides most of the history at bedside.  As per daughter patient gets out of breath in the morning.  Though she is not requiring oxygen.  She was recently diagnosed with right-sided pleural effusion with possible mass seen on the chest CT.  Patient is supposed to follow-up with for this. In the ED patient was found to be in A. fib with RVR initially, heart rate is controlled at this time.  She also received 60 mg IV Lasix for bilateral lower extremity swelling. Her labs showed creatinine of 2.12, BUN 62.  BNP 1671.  Previous BNP as of 10 and 20 was 1151. Denies chest pain Denies nausea vomiting or diarrhea Complains of constipation Denies abdominal pain   Assessment & Plan: 1-acute on chronic diastolic heart failure: -Patient presented with acute shortness of breath, increased lower extremity swelling and generalized weakness. -Good response so IV diuresis -Patient breathing easier and having improvement in lower extremity swelling on physical exam. -No requiring oxygen supplementation -Continue daily weights, strict I's and O's and low-sodium diet. -Follow renal function and electrolytes -Hopefully home in the next 24 hours. -Recent echocardiogram from July 2020 demonstrated preserved ejection fraction.  2-atrial fibrillation -Overnight patient with sustained heart rate in the 140s on 09/24/2019; required the  use of oral Cardizem -Good response and currently with a stable heart rate -Continue Cardizem, continue metoprolol at current dose continue Eliquis for secondary prevention.  3-type 2 diabetes mellitus -Continue Levemir and sliding scale insulin -A1c 7.5 -Modified carbohydrate diet recommended; dysphagia 3 diet  4-right pleural effusion with questionable mass -No requiring oxygen -Questionable equal presentation secondary to interstitial edema and vascular congestion -Switch IV Lasix to PO today and assess urine output.  -Repeat x-rays in the morning   5-physical deconditioning -Physical therapy has been ordered to evaluate and provide recommendations. -Will follow PT recommendations  6-acute kidney injury and chronic kidney disease is stage IV at baseline -Creatinine improving with diuresis -Continue follow renal function trend and electrolytes -Continue current diuresis; worsening renal function most likely in the setting of poor perfusion with CHF exacerbation.  7-depression/anxiety -Mood is stable at this time -No suicidal ideation or hallucinations -Continue use of Paxil.   8-GERD -Continue PPI  9-hyperlipidemia -Continue statins.  10-constipation -patient reports that her las bowel movement was about 6 days ago. -Will start Miralax and Colace  -Will give Dulcolax suppository X 1 and follow response.  DVT prophylaxis: Eliquis Code Status: Full code. Family Communication: Daughter at bedside; all questions were answered accordingly. Disposition Plan: Remains inpatient. Will change Lasix to PO today, follow renal function and electrolytes. Hopefully discharge home tomorrow. Will follow physical therapy recommendations.  Consultants:   None  Procedures:   See below for x-ray reports.  Antimicrobials:  Anti-infectives (From admission, onward)   None       Subjective: No chest pain, no fever, no nausea, no vomiting. Reports feeling better and breathing  improving. Still short of breath on exertion. No chest  pain, no fever, no nausea, no vomiting.  Reports breathing is improving.  Still short of breath on exertion. Continue oral Cardizem. Patient reports her last bowel movement was around 6 days ago; will give Miralax, Colace and Dulcolax suppository.  Objective: Vitals:   09/24/19 2142 09/25/19 0500 09/25/19 0523 09/25/19 1335  BP: (!) 153/89  (!) 127/99 124/65  Pulse: 65  74 (!) 127  Resp:   19 19  Temp:   98 F (36.7 C) 98 F (36.7 C)  TempSrc:    Oral  SpO2:   98% 97%  Weight:  69.2 kg    Height:        Intake/Output Summary (Last 24 hours) at 09/25/2019 1444 Last data filed at 09/25/2019 1400 Gross per 24 hour  Intake 720 ml  Output 1350 ml  Net -630 ml   Filed Weights   09/23/19 0425 09/24/19 0400 09/25/19 0500  Weight: 70.2 kg 70.6 kg 69.2 kg    Examination: General exam: Alert, awake, oriented x 2; denies chest pain or palpitations. Reports some improvement on her breathing. Still mild shortness of breath on exertion. Patient was in better spirits today and more active.  Respiratory system: No wheezing, good O2 saturation on room air. Fine crackles at bases. Positive rhonchi.  Cardiovascular system: Rate controlled. No rubs or gallops, no JVD. Positive systolic ejection murmur.  Gastrointestinal system: Abdomen is nondistended, soft and nontender. No organomegaly or masses felt. Normal bowel sounds heard. Central nervous system: Alert and oriented x 2 as mentioned above. No focal neurological deficits. Extremities: No cyanosis or clubbing. 1+ edema in bilateral lower extremities. TED hoses in place. Skin: No rashes, lesions or ulcers Psychiatry: Mood & affect appropriate. No suicidal ideation.   Data Reviewed: I have personally reviewed following labs and imaging studies  CBC: Recent Labs  Lab 09/22/19 1848 09/23/19 0729  WBC 4.3 4.3  NEUTROABS 3.4  --   HGB 11.2* 10.5*  HCT 37.6 34.8*  MCV 94.0 92.1  PLT  158 956   Basic Metabolic Panel: Recent Labs  Lab 09/22/19 1848 09/23/19 0635 09/23/19 0729 09/24/19 0658 09/25/19 0512  NA 135 138  --  142 143  K 4.3 4.5  --  3.8 3.9  CL 101 104  --  104 105  CO2 25 22  --  27 29  GLUCOSE 301* 289*  --  69* 74  BUN 62* 62*  --  63* 56*  CREATININE 2.12* 1.93*  --  1.77* 1.60*  CALCIUM 8.7* 8.8*  --  9.2 9.2  MG  --   --  1.7  --   --    GFR: Estimated Creatinine Clearance: 24.1 mL/min (A) (by C-G formula based on SCr of 1.6 mg/dL (H)).   Liver Function Tests: Recent Labs  Lab 09/22/19 1848 09/23/19 0635  AST 17 17  ALT 22 18  ALKPHOS 47 44  BILITOT 0.6 0.6  PROT 6.4* 5.9*  ALBUMIN 3.3* 3.0*   HbA1C: Recent Labs    09/23/19 0635  HGBA1C 7.5*   CBG: Recent Labs  Lab 09/24/19 1114 09/24/19 1612 09/24/19 2140 09/25/19 0739 09/25/19 1101  GLUCAP 149* 143* 111* 103* 90   Urine analysis:    Component Value Date/Time   COLORURINE YELLOW 09/22/2019 2054   APPEARANCEUR CLEAR 09/22/2019 2054   APPEARANCEUR Clear 09/27/2017 1056   LABSPEC 1.010 09/22/2019 2054   PHURINE 5.0 09/22/2019 2054   GLUCOSEU NEGATIVE 09/22/2019 2054   HGBUR SMALL (A) 09/22/2019 2054  BILIRUBINUR NEGATIVE 09/22/2019 2054   BILIRUBINUR Negative 09/27/2017 1056   [place] 09/22/2019 2054   PROTEINUR NEGATIVE 09/22/2019 2054   UROBILINOGEN negative 02/03/2016 1516   UROBILINOGEN 0.2 10/17/2015 1915   NITRITE NEGATIVE 09/22/2019 2054   LEUKOCYTESUR NEGATIVE 09/22/2019 2054    Recent Results (from the past 240 hour(s))  SARS CORONAVIRUS 2 (TAT 6-24 HRS) Nasopharyngeal Nasopharyngeal Swab     Status: None   Collection Time: 09/23/19  3:40 AM   Specimen: Nasopharyngeal Swab  Result Value Ref Range Status   SARS Coronavirus 2 NEGATIVE NEGATIVE Final    Comment: (NOTE) SARS-CoV-2 target nucleic acids are NOT DETECTED. The SARS-CoV-2 RNA is generally detectable in upper and lower respiratory specimens during the acute phase of infection.  Negative results do not preclude SARS-CoV-2 infection, do not rule out co-infections with other pathogens, and should not be used as the sole basis for treatment or other patient management decisions. Negative results must be combined with clinical observations, patient history, and epidemiological information. The expected result is Negative. Fact Sheet for Patients: SugarRoll.be Fact Sheet for Healthcare Providers: https://www.woods-mathews.com/ This test is not yet approved or cleared by the [place] FDA and  has been authorized for detection and/or diagnosis of SARS-CoV-2 by FDA under an Emergency Use Authorization (EUA). This EUA will remain  in effect (meaning this test can be used) for the duration of the COVID-19 declaration under Section 56 4(b)(1) of the Act, 21 U.S.C. section 360bbb-3(b)(1), unless the authorization is terminated or revoked sooner. Performed at [facility], [place] [address], [place], [place] [address]      Radiology Studies: No results found.  Scheduled Meds:  apixaban  2.5 mg Oral BID   atorvastatin  20 mg Oral QHS   diltiazem  30 mg Oral Q8H   feeding supplement (ENSURE ENLIVE)  237 mL Oral BID BM   feeding supplement (GLUCERNA SHAKE)  237 mL Oral TID BM   ferrous sulfate  325 mg Oral q morning - 10a   furosemide  40 mg Intravenous Daily   insulin aspart  0-9 Units Subcutaneous TID WC   insulin detemir  24 Units Subcutaneous q morning - 10a   mouth rinse  15 mL Mouth Rinse BID   metoprolol succinate  100 mg Oral Daily   nystatin   Topical BID   pantoprazole  40 mg Oral Daily   PARoxetine  20 mg Oral q morning - 10a   sodium chloride flush  3 mL Intravenous Q12H   Continuous Infusions:  sodium chloride       LOS: 1 day    Time spent: 30 minutes.   Barton Dubois, MD [facility] Pager [phone]   09/25/2019, 2:44 PM

## 2019-09-25 NOTE — Evaluation (Signed)
Clinical/Bedside Swallow Evaluation Patient Details  Name: [name] MRN: [id number] Date of Birth: [birth year]  Today's Date: 09/25/2019 Time: SLP Start Time (ACUTE ONLY): 1300 SLP Stop Time (ACUTE ONLY): 1326 SLP Time Calculation (min) (ACUTE ONLY): 26 min  Past Medical History:  Past Medical History:  Diagnosis Date  . Anemia   . Anxiety   . Aortic stenosis   . Arthritis    back, knees, and hips  . Asthma    with allergies  . Balance problems   . CAD (coronary artery disease)   . Carotid stenosis   . CHF (congestive heart failure) ([place])    EF preserved Echo 2012  . CKD (chronic kidney disease)   . 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 Dementia ([place])    "a little"  . Depression   . Diabetes mellitus    type 2  . GERD (gastroesophageal reflux disease)   . Hypertension   . Iron deficiency anemia 05/27/2011  . Myocardial infarction ([place])   . Renal insufficiency   . Scoliosis   . Vertigo    "when sugar gets low"  . Vision loss    left eye-"half blind"   Past Surgical History:  Past Surgical History:  Procedure Laterality Date  . APPENDECTOMY     with hysterectomy  . CATARACT EXTRACTION Bilateral 5 years ago  . CHOLECYSTECTOMY    . COLONOSCOPY  2005   Dr. Amedeo Plenty: Diverticulosis  . COLONOSCOPY WITH PROPOFOL N/A 02/14/2019   Procedure: COLONOSCOPY WITH PROPOFOL;  Surgeon: Danie Binder, MD;  Location: [facility];  Service: Endoscopy;  Laterality: N/A;  . CORONARY ANGIOPLASTY     prior to 2006 5 stents  . CORONARY ARTERY BYPASS GRAFT  2006  . I & D of [place]  April 2013  . KNEE SURGERY Right   . PACEMAKER IMPLANT N/A 07/24/2019   Procedure: PACEMAKER IMPLANT;  Surgeon: Evans Lance, MD;  Location: [facility];  Service: Cardiovascular;  Laterality: N/A;  . PACEMAKER INSERTION    . POLYPECTOMY  02/14/2019   Procedure: POLYPECTOMY;  Surgeon: Danie Binder, MD;   Location: [facility];  Service: Endoscopy;;  cecal polyp  . TOTAL ABDOMINAL HYSTERECTOMY  ~85 years old   complete, with tumor removal  . TOTAL KNEE ARTHROPLASTY Right 08/13/2015   Procedure: RIGHT  TOTAL KNEE ARTHROPLASTY;  Surgeon: Paralee Cancel, MD;  Location: [facility];  Service: Orthopedics;  Laterality: Right;   HPI:  [name]  is a 85 y.o. female, with history of CAD, atrial fibrillation on anticoagulation, aortic stenosis, diastolic heart failure, chronic right pleural effusion, bradycardia, status post pacemaker placement who was brought to the hospital with complaints of worsening leg swelling and generalized weakness.  She was recently seen by cardiology on 09/20/2019 at that time hydralazine was discontinued and Lasix dose was increased.  Patient is a poor historian, daughter provides most of the history at bedside.  As per daughter patient gets out of breath in the morning.  Though she is not requiring oxygen.  She was recently diagnosed with right-sided pleural effusion with possible mass seen on the chest CT.  Patient is supposed to follow-up with for this. BSE requested.   Assessment / Plan / Recommendation Clinical Impression  Clinical swallow evaluation completed in room while Pt seated up in chair. She had just completed her lunch meal, but was agreeable to additional  trials. Her daughter reports that Pt has occasionally coughed when drinking liquids since her stroke over 10 years ago. She denies PNA. Current chest xray shows Stable moderate right pleural effusion and associated right lung base atelectasis or infiltrate. Pt wears upper dentures and is able to self feed. Pt with suspected delay in pharyngeal swallow vs premature spillage with liquids which resulted in audible swallow and cough x1 with large, sequential cup sips. She went on to self present cup and straw sips via small sips without signs of reduced airway protection. She presented with prolonged oral transit with bites  of meatloaf and need for secondary swallows to clear. Recommend D3/mech soft (her daughter reports this is similar to her baseline at home) and thin liquids via cup/straw, po medications whole with water. If chest xray is of concern for aspiration, we can complete MBSS while in acute stay as needed. SLP will follow up tomorrow.   SLP Visit Diagnosis: Dysphagia, unspecified (R13.10)    Aspiration Risk  Mild aspiration risk    Diet Recommendation Dysphagia 3 (Mech soft);Thin liquid   Liquid Administration via: Cup;Straw Medication Administration: Whole meds with liquid Supervision: Patient able to self feed;Intermittent supervision to cue for compensatory strategies Compensations: Slow rate;Small sips/bites Postural Changes: Seated upright at 90 degrees;Remain upright for at least 30 minutes after po intake    Other  Recommendations Oral Care Recommendations: Oral care BID;Staff/trained caregiver to provide oral care Other Recommendations: Clarify dietary restrictions   Follow up Recommendations None;24 hour supervision/assistance      Frequency and Duration min 2x/week  1 week       Prognosis Prognosis for Safe Diet Advancement: Good      Swallow Study   General Date of Onset: 09/22/19 HPI: [name]  is a 84 y.o. female, with history of CAD, atrial fibrillation on anticoagulation, aortic stenosis, diastolic heart failure, chronic right pleural effusion, bradycardia, status post pacemaker placement who was brought to the hospital with complaints of worsening leg swelling and generalized weakness.  She was recently seen by cardiology on 09/20/2019 at that time hydralazine was discontinued and Lasix dose was increased.  Patient is a poor historian, daughter provides most of the history at bedside.  As per daughter patient gets out of breath in the morning.  Though she is not requiring oxygen.  She was recently diagnosed with right-sided pleural effusion with possible mass seen on the  chest CT.  Patient is supposed to follow-up with for this. BSE requested. Type of Study: Bedside Swallow Evaluation Previous Swallow Assessment: Pt had BSE in 02/2015 with recommendation for D3 and thin Diet Prior to this Study: Regular;Thin liquids Temperature Spikes Noted: No Respiratory Status: Room air History of Recent Intubation: No Behavior/Cognition: Alert;Cooperative;Pleasant mood Oral Cavity Assessment: Within Functional Limits Oral Care Completed by SLP: Recent completion by staff Oral Cavity - Dentition: Dentures, top Vision: Functional for self-feeding Self-Feeding Abilities: Able to feed self;Needs set up Patient Positioning: Upright in chair Baseline Vocal Quality: Normal Volitional Cough: Strong Volitional Swallow: Able to elicit    Oral/Motor/Sensory Function Overall Oral Motor/Sensory Function: Within functional limits   Ice Chips Ice chips: Within functional limits Presentation: Spoon   Thin Liquid Thin Liquid: Impaired Presentation: Cup;Self Fed;Straw Pharyngeal  Phase Impairments: Suspected delayed Swallow;Cough - Immediate(immediate to delayed cough x1)    Nectar Thick Nectar Thick Liquid: Within functional limits Presentation: Cup   Honey Thick Honey Thick Liquid: Not tested   Puree Puree: Within functional limits Presentation: Spoon   Solid  Solid: Impaired Presentation: Spoon Oral Phase Impairments: Impaired mastication Oral Phase Functional Implications: Prolonged oral transit;Impaired mastication     Thank you,  [name], [place]  [name] 09/25/2019,1:51 PM

## 2019-09-26 ENCOUNTER — Inpatient Hospital Stay (HOSPITAL_COMMUNITY): Payer: Medicare Other

## 2019-09-26 DIAGNOSIS — E1122 Type 2 diabetes mellitus with diabetic chronic kidney disease: Secondary | ICD-10-CM

## 2019-09-26 DIAGNOSIS — Z794 Long term (current) use of insulin: Secondary | ICD-10-CM

## 2019-09-26 DIAGNOSIS — N183 Chronic kidney disease, stage 3 unspecified: Secondary | ICD-10-CM

## 2019-09-26 DIAGNOSIS — N1832 Chronic kidney disease, stage 3b: Secondary | ICD-10-CM

## 2019-09-26 DIAGNOSIS — K5900 Constipation, unspecified: Secondary | ICD-10-CM

## 2019-09-26 LAB — GLUCOSE, CAPILLARY
Glucose-Capillary: 48 mg/dL — ABNORMAL LOW (ref 70–99)
Glucose-Capillary: 78 mg/dL (ref 70–99)
Glucose-Capillary: 174 mg/dL — ABNORMAL HIGH (ref 70–99)

## 2019-09-26 LAB — BASIC METABOLIC PANEL
Anion gap: 8 (ref 5–15)
BUN: 52 mg/dL — ABNORMAL HIGH (ref 8–23)
CO2: 29 mmol/L (ref 22–32)
Calcium: 9.2 mg/dL (ref 8.9–10.3)
Chloride: 106 mmol/L (ref 98–111)
Creatinine, Ser: 1.43 mg/dL — ABNORMAL HIGH (ref 0.44–1.00)
GFR calc Af Amer: 39 mL/min — ABNORMAL LOW (ref >60)
GFR calc non Af Amer: 33 mL/min — ABNORMAL LOW (ref >60)
Glucose, Bld: 54 mg/dL — ABNORMAL LOW (ref 70–99)
Potassium: 4 mmol/L (ref 3.5–5.1)
Sodium: 143 mmol/L (ref 135–145)

## 2019-09-26 MED ORDER — INSULIN LISPRO 100 UNIT/ML (KWIKPEN): 5.0000 [IU] | PEN_INJECTOR | Freq: Every day | SUBCUTANEOUS | Status: DC | PRN

## 2019-09-26 MED ORDER — POLYETHYLENE GLYCOL 3350 17 G PO PACK: 17.0000 g | PACK | Freq: Two times a day (BID) | ORAL | 1 refills | Status: AC

## 2019-09-26 MED ORDER — MAGNESIUM HYDROXIDE 400 MG/5ML PO SUSP: 30.0000 mL | Freq: Every day | ORAL | Status: AC | PRN

## 2019-09-26 MED ORDER — DOCUSATE SODIUM 100 MG PO CAPS: 100.0000 mg | ORAL_CAPSULE | Freq: Two times a day (BID) | ORAL | 0 refills | Status: AC

## 2019-09-26 MED ORDER — DILTIAZEM HCL 30 MG PO TABS: 30.0000 mg | ORAL_TABLET | Freq: Three times a day (TID) | ORAL | 1 refills | Status: DC

## 2019-09-26 NOTE — Plan of Care (Signed)
Problem: Education: Goal: Knowledge of General Education information will improve Description: Including pain rating scale, medication(s)/side effects and non-pharmacologic comfort measures 09/26/2019 1415 by Angelyn Punt, RN Outcome: Adequate for Discharge 09/26/2019 0748 by Angelyn Punt, RN Outcome: Progressing   Problem: Health Behavior/Discharge Planning: Goal: Ability to manage health-related needs will improve 09/26/2019 1415 by Angelyn Punt, RN Outcome: Adequate for Discharge 09/26/2019 0748 by Angelyn Punt, RN Outcome: Progressing   Problem: Clinical Measurements: Goal: Ability to maintain clinical measurements within normal limits will improve 09/26/2019 1415 by Angelyn Punt, RN Outcome: Adequate for Discharge 09/26/2019 0748 by Angelyn Punt, RN Outcome: Progressing Goal: Will remain free from infection 09/26/2019 1415 by Angelyn Punt, RN Outcome: Adequate for Discharge 09/26/2019 0748 by Angelyn Punt, RN Outcome: Progressing Goal: Diagnostic test results will improve 09/26/2019 1415 by Angelyn Punt, RN Outcome: Adequate for Discharge 09/26/2019 0748 by Angelyn Punt, RN Outcome: Progressing Goal: Respiratory complications will improve 09/26/2019 1415 by Angelyn Punt, RN Outcome: Adequate for Discharge 09/26/2019 0748 by Angelyn Punt, RN Outcome: Progressing Goal: Cardiovascular complication will be avoided 09/26/2019 1415 by Angelyn Punt, RN Outcome: Adequate for Discharge 09/26/2019 0748 by Angelyn Punt, RN Outcome: Progressing   Problem: Activity: Goal: Risk for activity intolerance will decrease 09/26/2019 1415 by Angelyn Punt, RN Outcome: Adequate for Discharge 09/26/2019 [phone] by Angelyn Punt, RN Outcome: Progressing   Problem: Nutrition: Goal: Adequate nutrition will be maintained 09/26/2019 1415 by Angelyn Punt, RN Outcome:  Adequate for Discharge 09/26/2019 0748 by Angelyn Punt, RN Outcome: Progressing   Problem: Coping: Goal: Level of anxiety will decrease 09/26/2019 1415 by Angelyn Punt, RN Outcome: Adequate for Discharge 09/26/2019 0748 by Angelyn Punt, RN Outcome: Progressing   Problem: Elimination: Goal: Will not experience complications related to bowel motility 09/26/2019 1415 by Angelyn Punt, RN Outcome: Adequate for Discharge 09/26/2019 0748 by Angelyn Punt, RN Outcome: Progressing Goal: Will not experience complications related to urinary retention 09/26/2019 1415 by Angelyn Punt, RN Outcome: Adequate for Discharge 09/26/2019 0748 by Angelyn Punt, RN Outcome: Progressing   Problem: Pain Managment: Goal: General experience of comfort will improve 09/26/2019 1415 by Angelyn Punt, RN Outcome: Adequate for Discharge 09/26/2019 0748 by Angelyn Punt, RN Outcome: Progressing   Problem: Safety: Goal: Ability to remain free from injury will improve 09/26/2019 1415 by Angelyn Punt, RN Outcome: Adequate for Discharge 09/26/2019 0748 by Angelyn Punt, RN Outcome: Progressing   Problem: Skin Integrity: Goal: Risk for impaired skin integrity will decrease 09/26/2019 1415 by Angelyn Punt, RN Outcome: Adequate for Discharge 09/26/2019 0748 by Angelyn Punt, RN Outcome: Progressing   Problem: Education: Goal: Ability to demonstrate management of disease process will improve 09/26/2019 1415 by Angelyn Punt, RN Outcome: Adequate for Discharge 09/26/2019 0748 by Angelyn Punt, RN Outcome: Progressing Goal: Ability to verbalize understanding of medication therapies will improve 09/26/2019 1415 by Angelyn Punt, RN Outcome: Adequate for Discharge 09/26/2019 0748 by Angelyn Punt, RN Outcome: Progressing Goal: Individualized Educational Video(s) 09/26/2019 1415 by Angelyn Punt, RN Outcome: Adequate for Discharge 09/26/2019 0748 by Angelyn Punt, RN Outcome: Progressing   Problem: Activity: Goal: Capacity to carry out activities will improve 09/26/2019 1415 by Angelyn Punt, RN Outcome: Adequate for Discharge 09/26/2019 [phone] by Angelyn Punt, RN Outcome: Progressing   Problem: Cardiac: Goal: Ability to achieve and maintain adequate cardiopulmonary perfusion  will improve 09/26/2019 1415 by Angelyn Punt, RN Outcome: Adequate for Discharge 09/26/2019 [phone] by Angelyn Punt, RN Outcome: Progressing   Problem: Education: Goal: Knowledge of disease or condition will improve 09/26/2019 1415 by Angelyn Punt, RN Outcome: Adequate for Discharge 09/26/2019 0748 by Angelyn Punt, RN Outcome: Progressing Goal: Understanding of medication regimen will improve 09/26/2019 1415 by Angelyn Punt, RN Outcome: Adequate for Discharge 09/26/2019 0748 by Angelyn Punt, RN Outcome: Progressing Goal: Individualized Educational Video(s) 09/26/2019 1415 by Angelyn Punt, RN Outcome: Adequate for Discharge 09/26/2019 0748 by Angelyn Punt, RN Outcome: Progressing   Problem: Activity: Goal: Ability to tolerate increased activity will improve 09/26/2019 1415 by Angelyn Punt, RN Outcome: Adequate for Discharge 09/26/2019 [phone] by Angelyn Punt, RN Outcome: Progressing   Problem: Cardiac: Goal: Ability to achieve and maintain adequate cardiopulmonary perfusion will improve 09/26/2019 1415 by Angelyn Punt, RN Outcome: Adequate for Discharge 09/26/2019 0748 by Angelyn Punt, RN Outcome: Progressing   Problem: Health Behavior/Discharge Planning: Goal: Ability to safely manage health-related needs after discharge will improve 09/26/2019 1415 by Angelyn Punt, RN Outcome: Adequate for Discharge 09/26/2019 0748 by Angelyn Punt, RN Outcome: Progressing

## 2019-09-26 NOTE — Progress Notes (Signed)
Inpatient Diabetes Program Recommendations  AACE/ADA: New Consensus Statement on Inpatient Glycemic Control (2015)  Target Ranges:  Prepandial:   less than 140 mg/dL      Peak postprandial:   less than 180 mg/dL (1-2 hours)      Critically ill patients:  140 - 180 mg/dL   Lab Results  Component Value Date   GLUCAP 78 09/26/2019   HGBA1C 7.5 (H) 09/23/2019    Review of Glycemic Control Results for [name], [name] (MRN [id number]) as of 09/26/2019 09:29  Ref. Range 09/25/2019 07:39 09/25/2019 11:01 09/25/2019 15:56 09/25/2019 21:20 09/26/2019 07:30 09/26/2019 07:52  Glucose-Capillary Latest Ref Range: 70 - 99 mg/dL 103 (H) 90 148 (H) 136 (H) 48 (L) 78   Diabetes history: DM 2 Outpatient Diabetes medications: Levemir 24 units Daily, Humalog 4-15 units Daily, Metformin 500 mg bid Current orders for Inpatient glycemic control:  Levemir 24 units Novolog 0-9 units tid  Inpatient Diabetes Program Recommendations:    Hypoglycemia 48 this am. Consider reducing Levemir to 16-18 units.  Thanks,  Tama Headings RN, MSN, BC-ADM Inpatient Diabetes Coordinator Team Pager [phone] (8a-5p)

## 2019-09-26 NOTE — Discharge Summary (Signed)
Physician Discharge Summary  PAUL TORPEY PXT:[id number] DOB: 1931-05-27 DOA: 09/22/2019  PCP: [name], MD  Admit date: 09/22/2019 Discharge date: 09/26/2019  Time spent: 35 minutes  Recommendations for Outpatient Follow-up:  1. Repeat basic metabolic panel to evaluate lites renal function 2. Reassess blood sugar and further adjust hypoglycemic regimen as needed.   Discharge Diagnoses:  Acute on chronic diastolic heart failure Type 2 diabetes mellitus with stage 3b chronic kidney disease, with long-term current use of insulin ([place]) CKD stage 3 due to type 2 diabetes mellitus (HCC) Atrial fibrillation with rapid ventricular response (HCC) Unspecified constipation Gastroesophageal disease Hyperlipidemia Physical deconditioning Depression/anxiety  Discharge Condition: Stable and improved.  Discharged home with instruction to follow-up with PCP and cardiology service as an outpatient.  Home health services for PT has been arranged at time of discharge.  Diet recommendation: Heart healthy/low-sodium modified carbohydrate diet.  Filed Weights   09/24/19 0400 09/25/19 0500 09/26/19 0500  Weight: 70.6 kg 69.2 kg 68 kg    History of present illness:  As per H&P written by Dr. Darrick Meigs on 09/22/2019 85 y.o.female,with history of CAD, atrial fibrillation on anticoagulation, aortic stenosis, diastolic heart failure, chronic right pleural effusion,bradycardia,status post pacemaker placement who was brought to the hospital with complaints of worsening leg swelling and generalized weakness. She was recently seen by cardiology on 09/20/2019 at that time hydralazine was discontinued and Lasix dose was increased. Patient is a poor historian, daughter provides most of the history at bedside. As per daughter patient gets out of breath in the morning. Though she is not requiring oxygen. She was recently diagnosed with right-sided pleural effusion with possible mass seen on the chest CT.  Patient is supposed to follow-up with for this. In the ED patient was found to be in A. fib with RVR initially, heart rate is controlled at this time. She also received 60 mg IV Lasix for bilateral lower extremity swelling. Her labs showed creatinine of 2.12, BUN 62. BNP 1671. Previous BNP was 1151. Denies chest pain Denies nausea vomiting or diarrhea Complains of constipation Denies abdominal pain  Hospital Course:  1-acute on chronic diastolic heart failure: -Patient presented with acute shortness of breath, increased lower extremity swelling and generalized weakness. -Good response to IV diuresis -Patient breathing a lot easier and having improvement in lower extremity swelling on physical exam. -No requiring oxygen supplementation at discharge. -Continue daily weights and low-sodium diet. -Follow renal function and electrolytes at up coming visit. -Recent echocardiogram from July 2020 demonstrated preserved ejection fraction. -contninue outpatient follow up with cardiology service. -resume home dose of lasix 40mg  BID.  2-atrial fibrillation -good rate control with cardizem and lopressor. -Continue Eliquis for secondary prevention.  3-type 2 diabetes mellitus -Continue Long acting and SSI -metformin discontinued.  -A1c 7.5 -Modified carbohydrate diet recommended  4-right pleural effusion with questionable mass -No requiring oxygen -Questionable equal presentation secondary to interstitial edema and vascular congestion -significant improvement after diuresis -no masses appreciated on repeat x-ray; clearing of vascular congestion appreciated.  5-physical deconditioning -Physical therapy evaluated patient and recommendations for HHPT provided; arranged by SW prior to discharge.   6-acute kidney injury and chronic kidney disease is stage IV at baseline -Creatinine improved with diuresis -Continue follow renal function trend and electrolytes -Cr at discharge  1.4 -patient advised to maintain adequate hydration.  7-depression/anxiety -Mood is stable at this time -No suicidal ideation or hallucinations -Continue use of Paxil.   8-GERD -Continue PPI  9-hyperlipidemia -Continue statins.  10-constipation -patient reports that  her las bowel movement was about 6 days ago. -excellent response to colace, miralax and dulcolax suppository  -Will discharge on Miralax and Colace  -PRN Milk of magnesia for moderate constipation.  11-dysphagia -seen by SPL -recommendation for dysphagia 3 with thin liquids provided.   Procedures:  See below for x-reports.   Consultations:  None   Discharge Exam: Vitals:   09/26/19 0520 09/26/19 1134  BP: (!) 144/90 (!) 133/105  Pulse: (!) 117 (!) 101  Resp: 16   Temp: 98.6 F (37 C)   SpO2: 96% 95%   General exam: Alert, awake, oriented x 2; denies chest pain or palpitations. Reports significant  improvement in her breathing. Patient was in better spirits today and more active. had BM last night.  Respiratory system: No wheezing, good O2 saturation on room air. No Frank  crackles on exam. Positive rhonchi.  Cardiovascular system: Rate controlled. No rubs or gallops, no JVD. Positive systolic ejection murmur appreciated.  Gastrointestinal system: Abdomen is nondistended, soft and nontender. No organomegaly or masses felt. Normal bowel sounds heard. Central nervous system: Alert and oriented x 2 as mentioned above. No focal neurological deficits. Extremities: No cyanosis or clubbing. Trace edema bilateral lower extremities. TED hoses in place. Skin: No rashes, lesions or ulcers Psychiatry: Mood & affect appropriate. No suicidal ideation.     Discharge Instructions   Discharge Instructions    (HEART FAILURE PATIENTS) Call MD:  Anytime you have any of the following symptoms: 1) 3 pound weight gain in 24 hours or 5 pounds in 1 week 2) shortness of breath, with or without a dry hacking cough 3)  swelling in the hands, feet or stomach 4) if you have to sleep on extra pillows at night in order to breathe.   Complete by: As directed    Diet - low sodium heart healthy   Complete by: As directed    Discharge instructions   Complete by: As directed    Take medications as prescribed Follow modified carbohydrates and low-sodium diet (less than 2.5 g of sodium daily) Maintain adequate hydration Arrange follow-up with PCP in 10 days Follow-up with cardiology as previously scheduled Check wait on daily basis     Allergies as of 09/26/2019      Reactions   Advil [ibuprofen] Swelling   Heparin Other (See Comments)   Confusion, hallucinations.    Propoxyphene N-acetaminophen Other (See Comments)   Numbness all over. "floating" sensation.      Medication List    STOP taking these medications   hydrOXYzine 50 MG tablet Commonly known as: ATARAX/VISTARIL   lisinopril 10 MG tablet Commonly known as: ZESTRIL   metFORMIN 500 MG 24 hr tablet Commonly known as: GLUCOPHAGE-XR   methenamine 1 g tablet Commonly known as: HIPREX   metoprolol tartrate 50 MG tablet Commonly known as: LOPRESSOR     TAKE these medications   albuterol 108 (90 Base) MCG/ACT inhaler Commonly known as: VENTOLIN HFA Inhale 2 puffs into the lungs every 6 (six) hours as needed for wheezing or shortness of breath.   apixaban 2.5 MG Tabs tablet Commonly known as: ELIQUIS Take 1 tablet (2.5 mg total) by mouth 2 (two) times daily.   atorvastatin 20 MG tablet Commonly known as: LIPITOR Take 1 tablet (20 mg total) by mouth at bedtime.   Clear Eyes for Dry Eyes 1-0.25 % Soln Generic drug: Carboxymethylcellul-Glycerin Place 1 drop into both eyes daily as needed (irritation).   clotrimazole-betamethasone cream Commonly known as: LOTRISONE Apply 1  application topically 2 (two) times daily.   DAILY VITAMINS/IRON/BETA CAROT PO Take 1 tablet by mouth at bedtime.   diltiazem 30 MG tablet Commonly known as:  CARDIZEM Take 1 tablet (30 mg total) by mouth every 8 (eight) hours.   docusate sodium 100 MG capsule Commonly known as: COLACE Take 1 capsule (100 mg total) by mouth 2 (two) times daily.   esomeprazole 40 MG capsule Commonly known as: NEXIUM TAKE 1 CAPSULE BY MOUTH ONCE DAILY. What changed: when to take this   ferrous sulfate 324 (65 Fe) MG Tbec Take 324 mg by mouth every morning.   furosemide 40 MG tablet Commonly known as: LASIX Take 2 tablets (80 mg total) by mouth daily. What changed:   how much to take  when to take this   insulin lispro 100 UNIT/ML KiwkPen Commonly known as: HUMALOG Inject 0.05-0.15 mLs (5-15 Units total) into the skin daily as needed (for high blood sugar levels over 250).   Levemir FlexTouch 100 UNIT/ML Pen Generic drug: Insulin Detemir INJECT 24 UNITS EACH MORNING What changed: See the new instructions.   magnesium hydroxide 400 MG/5ML suspension Commonly known as: MILK OF MAGNESIA Take 30 mLs by mouth daily as needed for moderate constipation. What changed: reasons to take this   metoprolol succinate 100 MG 24 hr tablet Commonly known as: TOPROL-XL Take 1 tablet (100 mg total) by mouth daily. For blood pressure control   nitroGLYCERIN 0.4 MG SL tablet Commonly known as: NITROSTAT PLACE 1 TAB UNDER TONGUE EVERY 5 MIN UP TO 3 TIMES AS NEEDED FOR CHEST PAIN, IF NO RELIEF CALL 911. What changed: See the new instructions.   PARoxetine 20 MG tablet Commonly known as: PAXIL TAKE 1 TABLET BY MOUTH EVERY MORNING.   polyethylene glycol 17 g packet Commonly known as: MiraLax Take 17 g by mouth 2 (two) times daily. What changed: when to take this   ramipril 5 MG capsule Commonly known as: ALTACE Take 1 capsule (5 mg total) by mouth daily.      Allergies  Allergen Reactions  . Advil [Ibuprofen] Swelling  . Heparin Other (See Comments)    Confusion, hallucinations.   . Propoxyphene N-Acetaminophen Other (See Comments)    Numbness all  over. "floating" sensation.   Follow-up Information    Stacks, Cletus Gash, MD. Schedule an appointment as soon as possible for a visit in 10 day(s).   Specialty: Family Medicine Contact information: [place] [address] [phone]        Minus Breeding, MD .   Specialty: Cardiology Contact information: [place] [address] [phone]            The results of significant diagnostics from this hospitalization (including imaging, microbiology, ancillary and laboratory) are listed below for reference.    Significant Diagnostic Studies: Dg Chest 2 View  Result Date: 09/26/2019 CLINICAL DATA:  Shortness of breath EXAM: CHEST - 2 VIEW COMPARISON:  09/22/2019 FINDINGS: Left pacer remains in place, unchanged. Prior CABG. Heart is normal size. Small right pleural effusion with right lower lobe atelectasis or infiltrate. Left lung clear. No acute bony abnormality. IMPRESSION: Right lower lobe atelectasis or infiltrate with small right effusion. Findings similar to prior study. Electronically Signed   By: Rolm Baptise M.D.   On: 09/26/2019 09:48   Dg Chest 2 View  Result Date: 09/15/2019 CLINICAL DATA:  Congestive heart failure, shortness of breath EXAM: CHEST - 2 VIEW COMPARISON:  09/05/2019 FINDINGS: Post CABG changes.  Left-sided implanted cardiac device. Heart size is stable. Calcific aortic knob. Moderate right pleural effusion and small left pleural effusion. Patchy right basilar opacity. Decrease in the degree of pulmonary vascular congestion compared to prior. No pneumothorax. Severe compression fracture of the L1 vertebral body, unchanged. IMPRESSION: Moderate right and small left pleural effusions with patchy right basilar opacity. Findings better characterized on same-day CT. Electronically Signed   By: Davina Poke M.D.   On: 09/15/2019 14:27   Ct Head Wo Contrast  Result Date: 09/05/2019 CLINICAL DATA:  Fall on anticoagulation EXAM: CT HEAD  WITHOUT CONTRAST TECHNIQUE: Contiguous axial images were obtained from the base of the skull through the vertex without intravenous contrast. COMPARISON:  CT head 06/29/2019 FINDINGS: Brain: Stable regions of gliosis in the right occipital lobe and bilateral cerebellar hemispheres compatible with remote infarcts. No evidence of acute infarction, hemorrhage, hydrocephalus, extra-axial collection or mass lesion/mass effect. Symmetric prominence of the ventricles, cisterns and sulci compatible with parenchymal volume loss. Patchy areas of white matter hypoattenuation are most compatible with chronic microvascular angiopathy. Vascular: Atherosclerotic calcification of the carotid siphons and intradural vertebral arteries. No hyperdense vessel. Skull: No calvarial fracture or suspicious osseous lesion. No scalp swelling or hematoma. Sinuses/Orbits: Mural sinus disease noted in the left maxillary and ethmoid sinuses. Orbital structures are unremarkable aside from prior lens extractions. Other: None IMPRESSION: 1. No acute intracranial abnormality. 2. 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 3. Chronic microvascular changes and moderate parenchymal volume loss, similar to prior. Electronically Signed   By: Lovena Le M.D.   On: 09/05/2019 19:18   Ct Chest Wo Contrast  Result Date: 09/15/2019 CLINICAL DATA:  Shortness of breath, pleural effusion EXAM: CT CHEST WITHOUT CONTRAST TECHNIQUE: Multidetector CT imaging of the chest was performed following the standard protocol without IV contrast. COMPARISON:  Same day chest radiograph FINDINGS: Car Cardiovascular: Aortic atherosclerosis. Normal heart size. Extensive 3 vessel coronary artery calcifications and/or stents status post median sternotomy and CABG. Aortic valve calcifications. Dense mitral annulus calcifications. No pericardial effusion. Left chest multi lead pacer. Mediastinum/Nodes: Prominent mediastinal lymph nodes without  pathologic enlargement. Thyroid gland, trachea, and esophagus demonstrate no significant findings. Lungs/Pleura: Moderate to large right pleural effusion with associated atelectasis or consolidation. Small left pleural effusion. There is a dense, rounded consolidation of the medial, infrahilar right lower lobe measuring 3.7 x 3.3 cm, suspicious for mass although difficult to distinguish from adjacent pleural fluid (series 4, image 108). Upper Abdomen:  Trace perihepatic ascites in the upper abdomen. Musculoskeletal: Disc degenerative disease and ankylosis of the thoracic spine. High-grade wedge deformity of L1. IMPRESSION: 1. Moderate to large right pleural effusion with associated atelectasis or consolidation. Small left pleural effusion. 2. There is a dense, rounded consolidation of the medial, infrahilar right lower lobe measuring 3.7 x 3.3 cm, suspicious for mass although difficult to distinguish from adjacent pleural fluid (series 4, image 108). Recommend contrast enhanced CT to more clearly evaluate. 3.  Trace perihepatic ascites in the upper abdomen. 4.  Coronary artery disease.  Aortic Atherosclerosis (ICD10-I70.0). Electronically Signed   By: Eddie Candle M.D.   On: 09/15/2019 12:09   Dg Chest Port 1 View  Result Date: 09/22/2019 CLINICAL DATA:  85 year old female with shortness of breath. EXAM: PORTABLE CHEST 1 VIEW COMPARISON:  Chest radiograph and CT dated 09/15/2019 FINDINGS: Moderate right pleural effusion similar to prior radiograph and CT. Right lung base density may represent atelectasis or infiltrate. Clinical correlation is recommended. There is  no pneumothorax. There is mild cardiomegaly with vascular congestion, new since the prior radiograph. Median sternotomy wires and left pectoral pacemaker device. No acute osseous pathology. Osteopenia. IMPRESSION: 1. Stable moderate right pleural effusion and associated right lung base atelectasis or infiltrate. 2. Cardiomegaly. Mild vascular  congestion, new since the prior radiograph. Electronically Signed   By: Anner Crete M.D.   On: 09/22/2019 23:28   Dg Chest Port 1 View  Result Date: 09/05/2019 CLINICAL DATA:  Fall.  Lethargy.  Pacemaker placed 6 weeks ago. EXAM: PORTABLE CHEST 1 VIEW COMPARISON:  Radiograph 07/25/2019 FINDINGS: Left-sided pacemaker in place. Post median sternotomy. Unchanged cardiomegaly. Unchanged aortic tortuosity and atherosclerosis. Progressive pulmonary edema from prior exam. Increased pleural effusions, right greater than left. No pneumothorax. No acute osseous abnormalities are seen. Chronic change about the left shoulder. IMPRESSION: 1. Progressive pulmonary edema and increasing pleural effusions, right greater than left. Findings consistent with CHF. 2. Stable cardiomegaly. 3. Aortic tortuosity and atherosclerosis. Aortic Atherosclerosis (ICD10-I70.0). Electronically Signed   By: Keith Rake M.D.   On: 09/05/2019 23:36    Microbiology: Recent Results (from the past 240 hour(s))  SARS CORONAVIRUS 2 (TAT 6-24 HRS) Nasopharyngeal Nasopharyngeal Swab     Status: None   Collection Time: 09/23/19  3:40 AM   Specimen: Nasopharyngeal Swab  Result Value Ref Range Status   SARS Coronavirus 2 NEGATIVE NEGATIVE Final    Comment: (NOTE) SARS-CoV-2 target nucleic acids are NOT DETECTED. The SARS-CoV-2 RNA is generally detectable in upper and lower respiratory specimens during the acute phase of infection. Negative results do not preclude SARS-CoV-2 infection, do not rule out co-infections with other pathogens, and should not be used as the sole basis for treatment or other patient management decisions. Negative results must be combined with clinical observations, patient history, and epidemiological information. The expected result is Negative. Fact Sheet for Patients: SugarRoll.be Fact Sheet for Healthcare Providers: https://www.woods-mathews.com/ This test  is not yet approved or cleared by the [place] FDA and  has been authorized for detection and/or diagnosis of SARS-CoV-2 by FDA under an Emergency Use Authorization (EUA). This EUA will remain  in effect (meaning this test can be used) for the duration of the COVID-19 declaration under Section 56 4(b)(1) of the Act, 21 U.S.C. section 360bbb-3(b)(1), unless the authorization is terminated or revoked sooner. Performed at [facility], [place] [address], [place], [place] [address]      Labs: Basic Metabolic Panel: Recent Labs  Lab 09/22/19 1848 09/23/19 0300 09/23/19 0729 09/24/19 0658 09/25/19 0512 09/26/19 0430  NA 135 138  --  142 143 143  K 4.3 4.5  --  3.8 3.9 4.0  CL 101 104  --  104 105 106  CO2 25 22  --  27 29 29   GLUCOSE 301* 289*  --  69* 74 54*  BUN 62* 62*  --  63* 56* 52*  CREATININE 2.12* 1.93*  --  1.77* 1.60* 1.43*  CALCIUM 8.7* 8.8*  --  9.2 9.2 9.2  MG  --   --  1.7  --   --   --    Liver Function Tests: Recent Labs  Lab 09/22/19 1848 09/23/19 0635  AST 17 17  ALT 22 18  ALKPHOS 47 44  BILITOT 0.6 0.6  PROT 6.4* 5.9*  ALBUMIN 3.3* 3.0*   CBC: Recent Labs  Lab 09/22/19 1848 09/23/19 0729  WBC 4.3 4.3  NEUTROABS 3.4  --   HGB 11.2* 10.5*  HCT 37.6  34.8*  MCV 94.0 92.1  PLT 158 155    BNP (last 3 results) Recent Labs    09/05/19 2001 09/15/19 0932 09/22/19 1848  BNP 1,201.0* 1,151.0* 1,671.0*    CBG: Recent Labs  Lab 09/25/19 1556 09/25/19 2120 09/26/19 0730 09/26/19 0752 09/26/19 1118  GLUCAP 148* 136* 48* 78 174*    Signed:  Barton Dubois MD.  [facility] 09/26/2019, 12:17 PM

## 2019-09-26 NOTE — Progress Notes (Signed)
Hypoglycemic Event  CBG: 48  Treatment: 4oz orange juice and 4 PB crackers  Symptoms: None noted. Alert, oriented to person and situation. Skin warm and dry.  Follow-up CBG: Time:0753 CBG Result:78  Possible Reasons for Event: pre- breakfast hypoglycemia  Comments/MD notified:[place]

## 2019-09-26 NOTE — TOC Transition Note (Signed)
Transition of Care [facility]) - CM/SW Discharge Note   Patient Details  Name: [name] MRN: [id number] Date of Birth: 03/21/1931  [facility] CM/SW Contact:  Shade Flood, LCSW Phone Number: 09/26/2019, 12:18 PM   Clinical Narrative:     Pt stable for dc home today per MD. Pt will have [facility] PT. Updated Tim at [place] [facility] [employer]. Pt's daughter will be taking pt home. There are no other TOC needs for dc.  Final next level of care: [place] Barriers to Discharge: Barriers Resolved   Patient Goals and CMS Choice Patient states their goals for this hospitalization and ongoing recovery are:: Return home CMS Medicare.gov Compare Post Acute Care list provided to:: Patient Represenative (must comment) Choice offered to / list presented to : Adult Children  Discharge Placement                       Discharge Plan and Services In-house Referral: Clinical Social Work   Post Acute Care Choice: Museum/gallery conservator, Home Health                    HH Arranged: PT [place] Agency: [facility] [employer] Date [place]: 09/25/19 Time [place]: 71 Representative spoke with at [place]: [place] ([place]) Interventions     Readmission Risk Interventions Readmission Risk Prevention Plan 09/25/2019  Transportation Screening Complete  Medication Review Press photographer) Complete  PCP or Specialist appointment within 3-5 days of discharge Complete  HRI or [place] Complete  SW Recovery Care/Counseling Consult Complete  [place] Not Applicable  Some recent data might be hidden

## 2019-09-26 NOTE — Plan of Care (Signed)
  Problem: Education: Goal: Knowledge of General Education information will improve Description: Including pain rating scale, medication(s)/side effects and non-pharmacologic comfort measures Outcome: Progressing   Problem: Health Behavior/Discharge Planning: Goal: Ability to manage health-related needs will improve Outcome: Progressing   Problem: Clinical Measurements: Goal: Ability to maintain clinical measurements within normal limits will improve Outcome: Progressing Goal: Will remain free from infection Outcome: Progressing Goal: Diagnostic test results will improve Outcome: Progressing Goal: Respiratory complications will improve Outcome: Progressing Goal: Cardiovascular complication will be avoided Outcome: Progressing   Problem: Activity: Goal: Risk for activity intolerance will decrease Outcome: Progressing   Problem: Nutrition: Goal: Adequate nutrition will be maintained Outcome: Progressing   Problem: Coping: Goal: Level of anxiety will decrease Outcome: Progressing   Problem: Elimination: Goal: Will not experience complications related to bowel motility Outcome: Progressing Goal: Will not experience complications related to urinary retention Outcome: Progressing   Problem: Pain Managment: Goal: General experience of comfort will improve Outcome: Progressing   Problem: Safety: Goal: Ability to remain free from injury will improve Outcome: Progressing   Problem: Skin Integrity: Goal: Risk for impaired skin integrity will decrease Outcome: Progressing   Problem: Education: Goal: Ability to demonstrate management of disease process will improve Outcome: Progressing Goal: Ability to verbalize understanding of medication therapies will improve Outcome: Progressing Goal: Individualized Educational Video(s) Outcome: Progressing   Problem: Activity: Goal: Capacity to carry out activities will improve Outcome: Progressing   Problem: Cardiac: Goal:  Ability to achieve and maintain adequate cardiopulmonary perfusion will improve Outcome: Progressing   Problem: Education: Goal: Knowledge of disease or condition will improve Outcome: Progressing Goal: Understanding of medication regimen will improve Outcome: Progressing Goal: Individualized Educational Video(s) Outcome: Progressing   Problem: Activity: Goal: Ability to tolerate increased activity will improve Outcome: Progressing   Problem: Cardiac: Goal: Ability to achieve and maintain adequate cardiopulmonary perfusion will improve Outcome: Progressing   Problem: Health Behavior/Discharge Planning: Goal: Ability to safely manage health-related needs after discharge will improve Outcome: Progressing   

## 2019-09-27 ENCOUNTER — Other Ambulatory Visit: Payer: Self-pay

## 2019-09-27 ENCOUNTER — Ambulatory Visit (INDEPENDENT_AMBULATORY_CARE_PROVIDER_SITE_OTHER): Payer: Medicare Other

## 2019-09-27 ENCOUNTER — Ambulatory Visit (INDEPENDENT_AMBULATORY_CARE_PROVIDER_SITE_OTHER): Payer: Medicare Other | Admitting: Family Medicine

## 2019-09-27 ENCOUNTER — Encounter: Payer: Self-pay | Admitting: Family Medicine

## 2019-09-27 VITALS — BP 135/65 | HR 101 | Temp 97.8°F | Resp 20

## 2019-09-27 DIAGNOSIS — K5904 Chronic idiopathic constipation: Secondary | ICD-10-CM | POA: Diagnosis not present

## 2019-09-27 DIAGNOSIS — J90 Pleural effusion, not elsewhere classified: Secondary | ICD-10-CM | POA: Diagnosis not present

## 2019-09-27 DIAGNOSIS — I509 Heart failure, unspecified: Secondary | ICD-10-CM | POA: Diagnosis not present

## 2019-09-27 MED ORDER — INSULIN LISPRO 100 UNIT/ML ~~LOC~~ SOLN: SUBCUTANEOUS | 11 refills | Status: AC

## 2019-09-27 MED ORDER — LEVEMIR FLEXTOUCH 100 UNIT/ML ~~LOC~~ SOPN: PEN_INJECTOR | SUBCUTANEOUS | 2 refills | Status: DC

## 2019-09-27 NOTE — Progress Notes (Signed)
Subjective:  [name], female    DOB: 06/27/1931  Age: 85 y.o. MRN: [id number]  CC: Hospitalization Follow-up   HPI [name] presents for recheck of blood pressure and CHF. Became dyspneic after visit last week. Admitted to hospital. Treated for CHF. No eva done of pulmonary nodule. Still awaiting referral completion o pulmonology. She is no more comfortable, breathing easier. No chest pain. Had a large bowel movement in the hospital and abd pain improved considerably. Therefore she and her daughter would like to cancel that CT. Her medications were adjusted due to misunderstanding about lisinopril and side effects of hydralazine. Ramipril and cardizem were added. She is no longer nauseated or dyspneic.  Depression screen [facility] 2/9 09/27/2019 09/21/2019 09/15/2019  Decreased Interest 0 0 0  Down, Depressed, Hopeless 0 0 0  PHQ - 2 Score 0 0 0  Altered sleeping - - -  Tired, decreased energy - - -  Change in appetite - - -  Feeling bad or failure about yourself  - - -  Trouble concentrating - - -  Moving slowly or fidgety/restless - - -  Suicidal thoughts - - -  PHQ-9 Score - - -  Difficult doing work/chores - - -  Some recent data might be hidden    History [name] has a past medical history of Anemia, Anxiety, Aortic stenosis, Arthritis, Asthma, Balance problems, CAD (coronary artery disease), Carotid stenosis, CHF (congestive heart failure) ([place]), CKD (chronic kidney disease), CVA (cerebral infarction), Dementia ([place] [facility]), Depression, Diabetes mellitus, GERD (gastroesophageal reflux disease), Hypertension, Iron deficiency anemia (05/27/2011), Myocardial infarction [facility]), Renal insufficiency, Scoliosis, Vertigo, and Vision loss.   She has a past surgical history that includes I & D of [place] (April 2013); Coronary artery bypass graft (2006); Coronary angioplasty; Total abdominal hysterectomy (~85 years old); Cholecystectomy; Appendectomy; Cataract extraction  (Bilateral, 5 years ago); Total knee arthroplasty (Right, 08/13/2015); Knee surgery (Right); Colonoscopy (2005); Colonoscopy with propofol (N/A, 02/14/2019); polypectomy (02/14/2019); PACEMAKER IMPLANT (N/A, 07/24/2019); and Pacemaker insertion.   Her family history includes Cancer in her brother; Diabetes in her sister and sister; Early death in her sister and son; Heart attack in her brother, brother, sister, and son; Heart attack (age of onset: 47) in her daughter; Heart disease in her brother, brother, and brother; Hypertension in her sister; Osteoporosis in her sister; Pancreatitis in her son.She reports that she has never smoked. She has never used smokeless tobacco. She reports that she does not drink alcohol or use drugs.    ROS Review of Systems  Constitutional: Negative.   HENT: Negative for congestion.   Eyes: Negative for visual disturbance.  Respiratory: Negative for shortness of breath.   Cardiovascular: Negative for chest pain.  Gastrointestinal: Negative for abdominal pain, constipation, diarrhea, nausea and vomiting.  Genitourinary: Negative for difficulty urinating.  Musculoskeletal: Negative for arthralgias and myalgias.  Neurological: Negative for headaches.  Psychiatric/Behavioral: Negative for sleep disturbance.    Objective:  BP 135/65   Pulse (!) 101   Temp 97.8 F (36.6 C) (Temporal)   Resp 20   SpO2 98%   BP Readings from Last 3 Encounters:  09/27/19 135/65  09/26/19 (!) 151/88  09/21/19 (!) 150/93    Wt Readings from Last 3 Encounters:  09/26/19 149 lb 14.6 oz (68 kg)  09/21/19 156 lb (70.8 kg)  09/20/19 156 lb (70.8 kg)     Physical Exam Constitutional:      General: She is not in acute distress.  Appearance: She is well-developed.  Cardiovascular:     Rate and Rhythm: Normal rate and regular rhythm.  Pulmonary:     Breath sounds: Normal breath sounds.  Skin:    General: Skin is warm and dry.  Neurological:     Mental Status: She is alert  and oriented to person, place, and time.     Comments: Speech garbled, frequently unintelligible.        Assessment & Plan:   [name] was seen today for hospitalization follow-up.  Diagnoses and all orders for this visit:  Pleural effusion, not elsewhere classified -     DG Chest 2 View; Future -     CMP14+EGFR -     CBC with Differential/Platelet -     Brain natriuretic peptide  Acute on chronic congestive heart failure, unspecified heart failure type (HCC) -     CMP14+EGFR -     CBC with Differential/Platelet -     Brain natriuretic peptide  Other orders -     Insulin Detemir (LEVEMIR FLEXTOUCH) 100 UNIT/ML Pen; INJECT 24 UNITS EACH MORNING -     insulin lispro (HUMALOG) 100 UNIT/ML injection; Check glucose before each meal (TID). If over 200 take 5 units. If over 300 take 8 units. If over 400 take 10 units.       I have discontinued [name]'s insulin lispro. I have also changed her Levemir FlexTouch. Additionally, I am having her start on insulin lispro. Lastly, I am having her maintain her Multiple Vitamins-Iron (DAILY VITAMINS/IRON/BETA CAROT PO), atorvastatin, ferrous sulfate, albuterol, PARoxetine, Clear Eyes for Dry Eyes, apixaban, nitroGLYCERIN, esomeprazole, ramipril, metoprolol succinate, clotrimazole-betamethasone, furosemide, diltiazem, docusate sodium, magnesium hydroxide, and polyethylene glycol.  Allergies as of 09/27/2019      Reactions   Advil [ibuprofen] Swelling   Heparin Other (See Comments)   Confusion, hallucinations.    Propoxyphene N-acetaminophen Other (See Comments)   Numbness all over. "floating" sensation.      Medication List       Accurate as of September 27, 2019 10:48 AM. If you have any questions, ask your nurse or doctor.        STOP taking these medications   insulin lispro 100 UNIT/ML KiwkPen Commonly known as: HUMALOG Replaced by: insulin lispro 100 UNIT/ML injection Stopped by: [name], MD     TAKE these  medications   albuterol 108 (90 Base) MCG/ACT inhaler Commonly known as: VENTOLIN HFA Inhale 2 puffs into the lungs every 6 (six) hours as needed for wheezing or shortness of breath.   apixaban 2.5 MG Tabs tablet Commonly known as: ELIQUIS Take 1 tablet (2.5 mg total) by mouth 2 (two) times daily.   atorvastatin 20 MG tablet Commonly known as: LIPITOR Take 1 tablet (20 mg total) by mouth at bedtime.   Clear Eyes for Dry Eyes 1-0.25 % Soln Generic drug: Carboxymethylcellul-Glycerin Place 1 drop into both eyes daily as needed (irritation).   clotrimazole-betamethasone cream Commonly known as: LOTRISONE Apply 1 application topically 2 (two) times daily.   DAILY VITAMINS/IRON/BETA CAROT PO Take 1 tablet by mouth at bedtime.   diltiazem 30 MG tablet Commonly known as: CARDIZEM Take 1 tablet (30 mg total) by mouth every 8 (eight) hours.   docusate sodium 100 MG capsule Commonly known as: COLACE Take 1 capsule (100 mg total) by mouth 2 (two) times daily.   esomeprazole 40 MG capsule Commonly known as: NEXIUM TAKE 1 CAPSULE BY MOUTH ONCE DAILY. What changed: when to take this  ferrous sulfate 324 (65 Fe) MG Tbec Take 324 mg by mouth every morning.   furosemide 40 MG tablet Commonly known as: LASIX Take 2 tablets (80 mg total) by mouth daily. What changed:   how much to take  when to take this   insulin lispro 100 UNIT/ML injection Commonly known as: HUMALOG Check glucose before each meal (TID). If over 200 take 5 units. If over 300 take 8 units. If over 400 take 10 units. Replaces: insulin lispro 100 UNIT/ML KiwkPen Started by: [name], MD   Levemir FlexTouch 100 UNIT/ML Pen Generic drug: Insulin Detemir INJECT 24 UNITS EACH MORNING What changed: See the new instructions.   magnesium hydroxide 400 MG/5ML suspension Commonly known as: MILK OF MAGNESIA Take 30 mLs by mouth daily as needed for moderate constipation.   metoprolol succinate 100 MG 24 hr  tablet Commonly known as: TOPROL-XL Take 1 tablet (100 mg total) by mouth daily. For blood pressure control   nitroGLYCERIN 0.4 MG SL tablet Commonly known as: NITROSTAT PLACE 1 TAB UNDER TONGUE EVERY 5 MIN UP TO 3 TIMES AS NEEDED FOR CHEST PAIN, IF NO RELIEF CALL 911. What changed: See the new instructions.   PARoxetine 20 MG tablet Commonly known as: PAXIL TAKE 1 TABLET BY MOUTH EVERY MORNING.   polyethylene glycol 17 g packet Commonly known as: MiraLax Take 17 g by mouth 2 (two) times daily.   ramipril 5 MG capsule Commonly known as: ALTACE Take 1 capsule (5 mg total) by mouth daily.        Follow-up: Return in about 1 week (around 10/04/2019).  [name], M.D.

## 2019-09-28 LAB — CMP14+EGFR
ALT: 12 IU/L (ref 0–32)
AST: 20 IU/L (ref 0–40)
Albumin/Globulin Ratio: 1.2 (ref 1.2–2.2)
Albumin: 3.2 g/dL — ABNORMAL LOW (ref 3.6–4.6)
Alkaline Phosphatase: 51 IU/L (ref 39–117)
BUN/Creatinine Ratio: 28 (ref 12–28)
BUN: 48 mg/dL — ABNORMAL HIGH (ref 8–27)
Bilirubin Total: 0.3 mg/dL (ref 0.0–1.2)
CO2: 24 mmol/L (ref 20–29)
Calcium: 9.4 mg/dL (ref 8.7–10.3)
Chloride: 105 mmol/L (ref 96–106)
Creatinine, Ser: 1.71 mg/dL — ABNORMAL HIGH (ref 0.57–1.00)
GFR calc Af Amer: 31 mL/min/{1.73_m2} — ABNORMAL LOW (ref >59)
GFR calc non Af Amer: 27 mL/min/{1.73_m2} — ABNORMAL LOW (ref >59)
Globulin, Total: 2.6 g/dL (ref 1.5–4.5)
Glucose: 62 mg/dL — ABNORMAL LOW (ref 65–99)
Potassium: 4.8 mmol/L (ref 3.5–5.2)
Sodium: 147 mmol/L — ABNORMAL HIGH (ref 134–144)
Total Protein: 5.8 g/dL — ABNORMAL LOW (ref 6.0–8.5)

## 2019-09-28 LAB — CBC WITH DIFFERENTIAL/PLATELET
Basophils Absolute: 0 10*3/uL (ref 0.0–0.2)
Basos: 1 %
EOS (ABSOLUTE): 0.1 10*3/uL (ref 0.0–0.4)
Eos: 2 %
Hematocrit: 34.1 % (ref 34.0–46.6)
Hemoglobin: 10.6 g/dL — ABNORMAL LOW (ref 11.1–15.9)
Immature Grans (Abs): 0 10*3/uL (ref 0.0–0.1)
Immature Granulocytes: 0 %
Lymphocytes Absolute: 0.8 10*3/uL (ref 0.7–3.1)
Lymphs: 17 %
MCH: 27.9 pg (ref 26.6–33.0)
MCHC: 31.1 g/dL — ABNORMAL LOW (ref 31.5–35.7)
MCV: 90 fL (ref 79–97)
Monocytes Absolute: 0.3 10*3/uL (ref 0.1–0.9)
Monocytes: 6 %
Neutrophils Absolute: 3.6 10*3/uL (ref 1.4–7.0)
Neutrophils: 74 %
Platelets: 149 10*3/uL — ABNORMAL LOW (ref 150–450)
RBC: 3.8 x10E6/uL (ref 3.77–5.28)
RDW: 13.5 % (ref 11.7–15.4)
WBC: 4.8 10*3/uL (ref 3.4–10.8)

## 2019-09-28 LAB — BRAIN NATRIURETIC PEPTIDE: BNP: 1304.5 pg/mL — ABNORMAL HIGH (ref 0.0–100.0)

## 2019-09-30 ENCOUNTER — Other Ambulatory Visit: Payer: Self-pay | Admitting: Family Medicine

## 2019-09-30 DIAGNOSIS — K219 Gastro-esophageal reflux disease without esophagitis: Secondary | ICD-10-CM

## 2019-09-30 DIAGNOSIS — F411 Generalized anxiety disorder: Secondary | ICD-10-CM

## 2019-10-03 ENCOUNTER — Observation Stay (HOSPITAL_COMMUNITY): Payer: Medicare Other

## 2019-10-03 ENCOUNTER — Inpatient Hospital Stay (HOSPITAL_COMMUNITY)
Admission: EM | Admit: 2019-10-03 | Discharge: 2019-10-07 | DRG: 308 | Disposition: A | Discharge status: Home Health | Payer: Medicare Other | Attending: Family Medicine | Admitting: Family Medicine

## 2019-10-03 ENCOUNTER — Encounter (HOSPITAL_COMMUNITY): Payer: Self-pay

## 2019-10-03 ENCOUNTER — Other Ambulatory Visit: Payer: Self-pay

## 2019-10-03 DIAGNOSIS — N390 Urinary tract infection, site not specified: Secondary | ICD-10-CM | POA: Diagnosis present

## 2019-10-03 DIAGNOSIS — H5462 Unqualified visual loss, left eye, normal vision right eye: Secondary | ICD-10-CM | POA: Diagnosis present

## 2019-10-03 DIAGNOSIS — F419 Anxiety disorder, unspecified: Secondary | ICD-10-CM | POA: Diagnosis present

## 2019-10-03 DIAGNOSIS — J811 Chronic pulmonary edema: Secondary | ICD-10-CM | POA: Diagnosis not present

## 2019-10-03 DIAGNOSIS — Z9049 Acquired absence of other specified parts of digestive tract: Secondary | ICD-10-CM

## 2019-10-03 DIAGNOSIS — H9190 Unspecified hearing loss, unspecified ear: Secondary | ICD-10-CM | POA: Diagnosis present

## 2019-10-03 DIAGNOSIS — Z8249 Family history of ischemic heart disease and other diseases of the circulatory system: Secondary | ICD-10-CM

## 2019-10-03 DIAGNOSIS — I4510 Unspecified right bundle-branch block: Secondary | ICD-10-CM | POA: Diagnosis present

## 2019-10-03 DIAGNOSIS — G9341 Metabolic encephalopathy: Secondary | ICD-10-CM | POA: Diagnosis not present

## 2019-10-03 DIAGNOSIS — K219 Gastro-esophageal reflux disease without esophagitis: Secondary | ICD-10-CM | POA: Diagnosis present

## 2019-10-03 DIAGNOSIS — M8580 Other specified disorders of bone density and structure, unspecified site: Secondary | ICD-10-CM | POA: Diagnosis present

## 2019-10-03 DIAGNOSIS — I69328 Other speech and language deficits following cerebral infarction: Secondary | ICD-10-CM

## 2019-10-03 DIAGNOSIS — N183 Chronic kidney disease, stage 3 unspecified: Secondary | ICD-10-CM | POA: Diagnosis present

## 2019-10-03 DIAGNOSIS — Z03818 Encounter for observation for suspected exposure to other biological agents ruled out: Secondary | ICD-10-CM | POA: Diagnosis not present

## 2019-10-03 DIAGNOSIS — I130 Hypertensive heart and chronic kidney disease with heart failure and stage 1 through stage 4 chronic kidney disease, or unspecified chronic kidney disease: Secondary | ICD-10-CM | POA: Diagnosis present

## 2019-10-03 DIAGNOSIS — E860 Dehydration: Secondary | ICD-10-CM | POA: Diagnosis not present

## 2019-10-03 DIAGNOSIS — Z20828 Contact with and (suspected) exposure to other viral communicable diseases: Secondary | ICD-10-CM | POA: Diagnosis present

## 2019-10-03 DIAGNOSIS — Z794 Long term (current) use of insulin: Secondary | ICD-10-CM

## 2019-10-03 DIAGNOSIS — Z96651 Presence of right artificial knee joint: Secondary | ICD-10-CM | POA: Diagnosis present

## 2019-10-03 DIAGNOSIS — F411 Generalized anxiety disorder: Secondary | ICD-10-CM | POA: Diagnosis present

## 2019-10-03 DIAGNOSIS — Z951 Presence of aortocoronary bypass graft: Secondary | ICD-10-CM

## 2019-10-03 DIAGNOSIS — J90 Pleural effusion, not elsewhere classified: Secondary | ICD-10-CM | POA: Diagnosis not present

## 2019-10-03 DIAGNOSIS — Z79899 Other long term (current) drug therapy: Secondary | ICD-10-CM

## 2019-10-03 DIAGNOSIS — R4182 Altered mental status, unspecified: Secondary | ICD-10-CM | POA: Diagnosis not present

## 2019-10-03 DIAGNOSIS — I350 Nonrheumatic aortic (valve) stenosis: Secondary | ICD-10-CM | POA: Diagnosis not present

## 2019-10-03 DIAGNOSIS — I6529 Occlusion and stenosis of unspecified carotid artery: Secondary | ICD-10-CM | POA: Diagnosis present

## 2019-10-03 DIAGNOSIS — F0390 Unspecified dementia without behavioral disturbance: Secondary | ICD-10-CM | POA: Diagnosis present

## 2019-10-03 DIAGNOSIS — R918 Other nonspecific abnormal finding of lung field: Secondary | ICD-10-CM | POA: Diagnosis not present

## 2019-10-03 DIAGNOSIS — I5033 Acute on chronic diastolic (congestive) heart failure: Secondary | ICD-10-CM | POA: Diagnosis not present

## 2019-10-03 DIAGNOSIS — I5032 Chronic diastolic (congestive) heart failure: Secondary | ICD-10-CM | POA: Diagnosis present

## 2019-10-03 DIAGNOSIS — Z950 Presence of cardiac pacemaker: Secondary | ICD-10-CM | POA: Diagnosis not present

## 2019-10-03 DIAGNOSIS — I4891 Unspecified atrial fibrillation: Secondary | ICD-10-CM | POA: Diagnosis present

## 2019-10-03 DIAGNOSIS — E782 Mixed hyperlipidemia: Secondary | ICD-10-CM | POA: Diagnosis present

## 2019-10-03 DIAGNOSIS — I2510 Atherosclerotic heart disease of native coronary artery without angina pectoris: Secondary | ICD-10-CM | POA: Diagnosis not present

## 2019-10-03 DIAGNOSIS — Z833 Family history of diabetes mellitus: Secondary | ICD-10-CM

## 2019-10-03 DIAGNOSIS — Z66 Do not resuscitate: Secondary | ICD-10-CM | POA: Diagnosis present

## 2019-10-03 DIAGNOSIS — Z955 Presence of coronary angioplasty implant and graft: Secondary | ICD-10-CM

## 2019-10-03 DIAGNOSIS — K5901 Slow transit constipation: Secondary | ICD-10-CM | POA: Diagnosis not present

## 2019-10-03 DIAGNOSIS — E785 Hyperlipidemia, unspecified: Secondary | ICD-10-CM | POA: Diagnosis present

## 2019-10-03 DIAGNOSIS — E1122 Type 2 diabetes mellitus with diabetic chronic kidney disease: Secondary | ICD-10-CM | POA: Diagnosis not present

## 2019-10-03 DIAGNOSIS — Z86010 Personal history of colonic polyps: Secondary | ICD-10-CM

## 2019-10-03 DIAGNOSIS — I4819 Other persistent atrial fibrillation: Principal | ICD-10-CM | POA: Diagnosis present

## 2019-10-03 DIAGNOSIS — I6930 Unspecified sequelae of cerebral infarction: Secondary | ICD-10-CM | POA: Diagnosis not present

## 2019-10-03 DIAGNOSIS — Z7901 Long term (current) use of anticoagulants: Secondary | ICD-10-CM

## 2019-10-03 DIAGNOSIS — K649 Unspecified hemorrhoids: Secondary | ICD-10-CM

## 2019-10-03 DIAGNOSIS — E11649 Type 2 diabetes mellitus with hypoglycemia without coma: Secondary | ICD-10-CM | POA: Diagnosis not present

## 2019-10-03 DIAGNOSIS — I10 Essential (primary) hypertension: Secondary | ICD-10-CM | POA: Diagnosis not present

## 2019-10-03 DIAGNOSIS — Z8042 Family history of malignant neoplasm of prostate: Secondary | ICD-10-CM

## 2019-10-03 DIAGNOSIS — Z8262 Family history of osteoporosis: Secondary | ICD-10-CM

## 2019-10-03 DIAGNOSIS — Z515 Encounter for palliative care: Secondary | ICD-10-CM | POA: Diagnosis not present

## 2019-10-03 DIAGNOSIS — Z888 Allergy status to other drugs, medicaments and biological substances status: Secondary | ICD-10-CM | POA: Diagnosis not present

## 2019-10-03 DIAGNOSIS — J45909 Unspecified asthma, uncomplicated: Secondary | ICD-10-CM | POA: Diagnosis present

## 2019-10-03 DIAGNOSIS — N1832 Chronic kidney disease, stage 3b: Secondary | ICD-10-CM | POA: Diagnosis present

## 2019-10-03 DIAGNOSIS — A419 Sepsis, unspecified organism: Secondary | ICD-10-CM

## 2019-10-03 DIAGNOSIS — I25708 Atherosclerosis of coronary artery bypass graft(s), unspecified, with other forms of angina pectoris: Secondary | ICD-10-CM | POA: Diagnosis not present

## 2019-10-03 DIAGNOSIS — I252 Old myocardial infarction: Secondary | ICD-10-CM | POA: Diagnosis not present

## 2019-10-03 DIAGNOSIS — R4781 Slurred speech: Secondary | ICD-10-CM | POA: Diagnosis not present

## 2019-10-03 DIAGNOSIS — Z7189 Other specified counseling: Secondary | ICD-10-CM | POA: Diagnosis not present

## 2019-10-03 DIAGNOSIS — E1165 Type 2 diabetes mellitus with hyperglycemia: Secondary | ICD-10-CM | POA: Diagnosis present

## 2019-10-03 DIAGNOSIS — K648 Other hemorrhoids: Secondary | ICD-10-CM | POA: Diagnosis present

## 2019-10-03 DIAGNOSIS — I495 Sick sinus syndrome: Secondary | ICD-10-CM | POA: Diagnosis not present

## 2019-10-03 DIAGNOSIS — R410 Disorientation, unspecified: Secondary | ICD-10-CM | POA: Diagnosis not present

## 2019-10-03 DIAGNOSIS — R0602 Shortness of breath: Secondary | ICD-10-CM | POA: Diagnosis not present

## 2019-10-03 DIAGNOSIS — Z87440 Personal history of urinary (tract) infections: Secondary | ICD-10-CM

## 2019-10-03 DIAGNOSIS — I358 Other nonrheumatic aortic valve disorders: Secondary | ICD-10-CM | POA: Diagnosis present

## 2019-10-03 DIAGNOSIS — Z90710 Acquired absence of both cervix and uterus: Secondary | ICD-10-CM

## 2019-10-03 DIAGNOSIS — K5900 Constipation, unspecified: Secondary | ICD-10-CM | POA: Diagnosis present

## 2019-10-03 HISTORY — DX: Unspecified atrial fibrillation: I48.91

## 2019-10-03 LAB — COMPREHENSIVE METABOLIC PANEL
ALT: 18 U/L (ref 0–44)
AST: 15 U/L (ref 15–41)
Albumin: 3.5 g/dL (ref 3.5–5.0)
Alkaline Phosphatase: 52 U/L (ref 38–126)
Anion gap: 12 (ref 5–15)
BUN: 42 mg/dL — ABNORMAL HIGH (ref 8–23)
CO2: 27 mmol/L (ref 22–32)
Calcium: 9.5 mg/dL (ref 8.9–10.3)
Chloride: 97 mmol/L — ABNORMAL LOW (ref 98–111)
Creatinine, Ser: 1.65 mg/dL — ABNORMAL HIGH (ref 0.44–1.00)
GFR calc Af Amer: 32 mL/min — ABNORMAL LOW (ref >60)
GFR calc non Af Amer: 28 mL/min — ABNORMAL LOW (ref >60)
Glucose, Bld: 483 mg/dL — ABNORMAL HIGH (ref 70–99)
Potassium: 4.8 mmol/L (ref 3.5–5.1)
Sodium: 136 mmol/L (ref 135–145)
Total Bilirubin: 1.1 mg/dL (ref 0.3–1.2)
Total Protein: 6.5 g/dL (ref 6.5–8.1)

## 2019-10-03 LAB — URINALYSIS, ROUTINE W REFLEX MICROSCOPIC
Bilirubin Urine: NEGATIVE
Glucose, UA: >=500 mg/dL — AB
Ketones, ur: NEGATIVE mg/dL
Nitrite: NEGATIVE
Protein, ur: 100 mg/dL — AB
Specific Gravity, Urine: 1.011 (ref 1.005–1.030)
pH: 5 (ref 5.0–8.0)

## 2019-10-03 LAB — GLUCOSE, CAPILLARY: Glucose-Capillary: 325 mg/dL — ABNORMAL HIGH (ref 70–99)

## 2019-10-03 LAB — CBC
HCT: 38.9 % (ref 36.0–46.0)
Hemoglobin: 11.5 g/dL — ABNORMAL LOW (ref 12.0–15.0)
MCH: 27.4 pg (ref 26.0–34.0)
MCHC: 29.6 g/dL — ABNORMAL LOW (ref 30.0–36.0)
MCV: 92.8 fL (ref 80.0–100.0)
Platelets: 122 10*3/uL — ABNORMAL LOW (ref 150–400)
RBC: 4.19 MIL/uL (ref 3.87–5.11)
RDW: 15.7 % — ABNORMAL HIGH (ref 11.5–15.5)
WBC: 6.2 10*3/uL (ref 4.0–10.5)
nRBC: 0 % (ref 0.0–0.2)

## 2019-10-03 LAB — CBG MONITORING, ED
Glucose-Capillary: 449 mg/dL — ABNORMAL HIGH (ref 70–99)
Glucose-Capillary: 361 mg/dL — ABNORMAL HIGH (ref 70–99)

## 2019-10-03 LAB — LACTIC ACID, PLASMA
Lactic Acid, Venous: 2.4 mmol/L (ref 0.5–1.9)
Lactic Acid, Venous: 1.7 mmol/L (ref 0.5–1.9)

## 2019-10-03 LAB — SARS CORONAVIRUS 2 (TAT 6-24 HRS): SARS Coronavirus 2: NEGATIVE

## 2019-10-03 MED ORDER — POLYETHYLENE GLYCOL 3350 17 G PO PACK
17.0000 g | PACK | Freq: Every day | ORAL | Status: DC
  Administered 2019-10-04 – 2019-10-06 (×3): 17 g via ORAL
  Filled 2019-10-03 (×4): qty 1

## 2019-10-03 MED ORDER — PAROXETINE HCL 20 MG PO TABS
20.0000 mg | ORAL_TABLET | Freq: Every morning | ORAL | Status: DC
  Administered 2019-10-03 – 2019-10-06 (×4): 20 mg via ORAL
  Filled 2019-10-03 (×6): qty 1

## 2019-10-03 MED ORDER — SODIUM CHLORIDE 0.9 % IV BOLUS
500.0000 mL | Freq: Once | INTRAVENOUS | Status: AC
  Administered 2019-10-03: 500 mL via INTRAVENOUS

## 2019-10-03 MED ORDER — DILTIAZEM LOAD VIA INFUSION
10.0000 mg | Freq: Once | INTRAVENOUS | Status: AC
  Administered 2019-10-03: 10 mg via INTRAVENOUS
  Filled 2019-10-03: qty 10

## 2019-10-03 MED ORDER — METOPROLOL SUCCINATE ER 50 MG PO TB24
100.0000 mg | ORAL_TABLET | Freq: Every day | ORAL | Status: DC
  Administered 2019-10-03 – 2019-10-07 (×5): 100 mg via ORAL
  Filled 2019-10-03: qty 2
  Filled 2019-10-03: qty 4
  Filled 2019-10-03 (×4): qty 2

## 2019-10-03 MED ORDER — ACETAMINOPHEN 325 MG PO TABS
650.0000 mg | ORAL_TABLET | Freq: Four times a day (QID) | ORAL | Status: DC | PRN
  Filled 2019-10-03: qty 2

## 2019-10-03 MED ORDER — ATORVASTATIN CALCIUM 20 MG PO TABS
20.0000 mg | ORAL_TABLET | Freq: Every day | ORAL | Status: DC
  Administered 2019-10-03 – 2019-10-06 (×4): 20 mg via ORAL
  Filled 2019-10-03 (×4): qty 1

## 2019-10-03 MED ORDER — SODIUM CHLORIDE 0.9 % IV SOLN
INTRAVENOUS | Status: AC
  Administered 2019-10-03 – 2019-10-04 (×2): via INTRAVENOUS

## 2019-10-03 MED ORDER — INSULIN ASPART 100 UNIT/ML ~~LOC~~ SOLN
0.0000 [IU] | SUBCUTANEOUS | Status: DC
  Administered 2019-10-03: 11 [IU] via SUBCUTANEOUS
  Administered 2019-10-04: 5 [IU] via SUBCUTANEOUS

## 2019-10-03 MED ORDER — SODIUM CHLORIDE 0.9% FLUSH
3.0000 mL | Freq: Once | INTRAVENOUS | Status: AC
  Administered 2019-10-03: 3 mL via INTRAVENOUS

## 2019-10-03 MED ORDER — INSULIN ASPART 100 UNIT/ML ~~LOC~~ SOLN
5.0000 [IU] | Freq: Once | SUBCUTANEOUS | Status: AC
  Administered 2019-10-03: 5 [IU] via SUBCUTANEOUS
  Filled 2019-10-03: qty 1

## 2019-10-03 MED ORDER — ACETAMINOPHEN 650 MG RE SUPP: 650.0000 mg | Freq: Four times a day (QID) | RECTAL | Status: DC | PRN

## 2019-10-03 MED ORDER — RAMIPRIL 5 MG PO CAPS
5.0000 mg | ORAL_CAPSULE | Freq: Every day | ORAL | Status: DC
  Administered 2019-10-03 – 2019-10-07 (×5): 5 mg via ORAL
  Filled 2019-10-03 (×6): qty 1

## 2019-10-03 MED ORDER — PANTOPRAZOLE SODIUM 40 MG PO TBEC
40.0000 mg | DELAYED_RELEASE_TABLET | Freq: Every day | ORAL | Status: DC
  Administered 2019-10-03 – 2019-10-07 (×5): 40 mg via ORAL
  Filled 2019-10-03 (×6): qty 1

## 2019-10-03 MED ORDER — APIXABAN 2.5 MG PO TABS
2.5000 mg | ORAL_TABLET | Freq: Two times a day (BID) | ORAL | Status: DC
  Administered 2019-10-03 – 2019-10-07 (×8): 2.5 mg via ORAL
  Filled 2019-10-03 (×9): qty 1

## 2019-10-03 MED ORDER — DILTIAZEM HCL-DEXTROSE 125-5 MG/125ML-% IV SOLN (PREMIX)
5.0000 mg/h | INTRAVENOUS | Status: DC
  Administered 2019-10-03: 5 mg/h via INTRAVENOUS
  Administered 2019-10-04: 15 mg/h via INTRAVENOUS
  Administered 2019-10-04: 10 mg/h via INTRAVENOUS
  Administered 2019-10-04: 15 mg/h via INTRAVENOUS
  Filled 2019-10-03 (×3): qty 125

## 2019-10-03 MED ORDER — INSULIN DETEMIR 100 UNIT/ML ~~LOC~~ SOLN
24.0000 [IU] | Freq: Every day | SUBCUTANEOUS | Status: DC
  Administered 2019-10-03: 22:00:00 24 [IU] via SUBCUTANEOUS
  Filled 2019-10-03: qty 0.24

## 2019-10-03 MED ORDER — METOPROLOL TARTRATE 5 MG/5ML IV SOLN
5.0000 mg | INTRAVENOUS | Status: AC
  Administered 2019-10-03: 17:00:00 5 mg via INTRAVENOUS
  Filled 2019-10-03: qty 5

## 2019-10-03 MED ORDER — ONDANSETRON HCL 4 MG PO TABS: 4.0000 mg | ORAL_TABLET | Freq: Four times a day (QID) | ORAL | Status: DC | PRN

## 2019-10-03 MED ORDER — ONDANSETRON HCL 4 MG/2ML IJ SOLN: 4.0000 mg | Freq: Four times a day (QID) | INTRAMUSCULAR | Status: DC | PRN

## 2019-10-03 MED ORDER — DILTIAZEM HCL 30 MG PO TABS
30.0000 mg | ORAL_TABLET | Freq: Three times a day (TID) | ORAL | Status: DC
  Administered 2019-10-03: 30 mg via ORAL
  Filled 2019-10-03: qty 1

## 2019-10-03 MED ORDER — SODIUM CHLORIDE 0.9 % IV SOLN
1.0000 g | INTRAVENOUS | Status: DC
  Administered 2019-10-04 – 2019-10-06 (×3): 1 g via INTRAVENOUS
  Filled 2019-10-03 (×3): qty 10

## 2019-10-03 MED ORDER — SODIUM CHLORIDE 0.9 % IV SOLN
1.0000 g | Freq: Once | INTRAVENOUS | Status: AC
  Administered 2019-10-03: 1 g via INTRAVENOUS
  Filled 2019-10-03: qty 10

## 2019-10-03 NOTE — ED Notes (Signed)
hospitalist notified about pt elevated BP

## 2019-10-03 NOTE — ED Provider Notes (Signed)
[facility] EMERGENCY DEPARTMENT Provider Note   CSN: [id number] Arrival date & time: 10/03/19  7829     History   Chief Complaint Chief Complaint  Patient presents with  . Altered Mental Status    HPI [name] is a 85 y.o. female.     Level 5 caveat for altered mental status for 2 days.  Daughter reports this frequently happens when patient has a urinary tract infection.  She is normally ambulatory with a walker.  No obvious fever or chills.  Multiple medical problems well documented in the PMH.     Past Medical History:  Diagnosis Date  . Anemia   . Anxiety   . Aortic stenosis   . Arthritis    back, knees, and hips  . Asthma    with allergies  . Atrial fibrillation ([place])   . Balance problems   . CAD (coronary artery disease)   . Carotid stenosis   . CHF (congestive heart failure) ([place])    EF preserved Echo 2012  . CKD (chronic kidney disease)   . 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 Dementia ([place])    "a little"  . Depression   . Diabetes mellitus    type 2  . GERD (gastroesophageal reflux disease)   . Hypertension   . Iron deficiency anemia 05/27/2011  . Myocardial infarction ([place])   . Renal insufficiency   . Scoliosis   . Vertigo    "when sugar gets low"  . Vision loss    left eye-"half blind"    Patient Active Problem List   Diagnosis Date Noted  . CHF (congestive heart failure) ([place]) 09/24/2019  . Atrial fibrillation with rapid ventricular response ([place]) 07/18/2019  . Nocturnal enuresis 07/03/2019  . Mixed stress and urge urinary incontinence 07/03/2019  . Generalized weakness 06/28/2019  . Symptomatic bradycardia 06/28/2019  . Apnea 02/15/2019  . Snoring 02/15/2019  . Polyp of colon   . Accelerated hypertension 01/20/2019  . Rectal bleeding 11/08/2018  . Rectal pain 11/08/2018  . Abnormal CT scan, colon 11/08/2018  . (HFpEF) heart failure with  preserved ejection fraction ([place]) 10/09/2018  . Acute on chronic diastolic CHF (congestive heart failure) ([place]) 10/09/2018  . Aortic stenosis, moderate 10/09/2018  . Mitral valve stenosis 10/09/2018  . Acute exacerbation of CHF (congestive heart failure) ([place]) 10/07/2018  . Acute hypoxemic respiratory failure ([place]) 10/07/2018  . Chronic anemia 10/07/2018  . Asthma 10/07/2018  . CVA (cerebral vascular accident) ([place]) 10/07/2018  . CAD (coronary artery disease) 10/07/2018  . Constipation 10/07/2018  . Physical deconditioning 10/07/2018  . History of recurrent UTIs 10/07/2018  . CKD stage 3 due to type 2 diabetes mellitus ([place]) 12/22/2017  . Dyslipidemia 12/22/2017  . Weakness 11/23/2017  . Overweight (BMI 25.0-29.9) 05/08/2016  . UTI (lower urinary tract infection) 11/12/2015  . Type 2 diabetes mellitus with stage 3b chronic kidney disease, with long-term current use of insulin ([place]) 11/12/2015  . Dementia ([place])   . Encephalopathy, metabolic 56/21/3086  . S/P right TKA 08/13/2015  . S/P knee replacement 08/13/2015  . Elevated troponin 03/06/2015  . Fever   . History of diabetes mellitus   . Left buttock pain   . Mixed hyperlipidemia 01/11/2015  . GAD (generalized anxiety disorder) 01/11/2015  . Depression 01/11/2015  . GERD (gastroesophageal reflux disease) 01/11/2015  . Osteopenia 07/13/2014  . Obesity (BMI 30-39.9) 06/19/2014  . Carotid  stenosis   . Anemia of chronic disease 12/25/2011  . Iron deficiency anemia 05/27/2011  . Diastolic CHF, chronic ([place]) 04/02/2010  . Type 2 diabetes mellitus with insulin therapy ([place]) 10/08/2009  . HLD (hyperlipidemia) 10/08/2009  . HTN (hypertension) 10/08/2009  . Coronary atherosclerosis 10/08/2009  . CAROTID STENOSIS 10/08/2009    Past Surgical History:  Procedure Laterality Date  . APPENDECTOMY     with hysterectomy  . CATARACT EXTRACTION Bilateral 5 years ago  . CHOLECYSTECTOMY    . COLONOSCOPY  2005   Dr. Amedeo Plenty: Diverticulosis   . COLONOSCOPY WITH PROPOFOL N/A 02/14/2019   Procedure: COLONOSCOPY WITH PROPOFOL;  Surgeon: Danie Binder, MD;  Location: [facility] ENDO SU[facility];  Service: Endoscopy;  Laterality: N/A;  . CORONARY ANGIOPLASTY     prior to 2006 5 stents  . CORONARY ARTERY BYPASS GRAFT  2006  . I & D of [place]  April 2013  . KNEE SURGERY Right   . PACEMAKER IMPLANT N/A 07/24/2019   Procedure: PACEMAKER IMPLANT;  Surgeon: Evans Lance, MD;  Location: [place] [facility];  Service: Cardiovascular;  Laterality: N/A;  . PACEMAKER INSERTION    . POLYPECTOMY  02/14/2019   Procedure: POLYPECTOMY;  Surgeon: Danie Binder, MD;  Location: [facility] END[facility] SU[facility];  Service: Endoscopy;;  cecal polyp  . TOTAL ABDOMINAL HYSTERECTOMY  ~85 years old   complete, with tumor removal  . TOTAL KNEE ARTHROPLASTY Right 08/13/2015   Procedure: RIGHT  TOTAL KNEE ARTHROPLASTY;  Surgeon: Paralee Cancel, MD;  Location: [facility] ORS;  Service: Orthopedics;  Laterality: Right;     OB History    Gravida  4   Para  4   Term  4   Preterm      AB      Living  3     SAB      TAB      Ectopic      Multiple      Live Births               Home Medications    Prior to Admission medications   Medication Sig Start Date End Date Taking? Authorizing Provider  albuterol (PROVENTIL HFA;VENTOLIN HFA) 108 (90 Base) MCG/ACT inhaler Inhale 2 puffs into the lungs every 6 (six) hours as needed for wheezing or shortness of breath. 01/20/19  Yes Stacks, Cletus Gash, MD  apixaban (ELIQUIS) 2.5 MG TABS tablet Take 1 tablet (2.5 mg total) by mouth 2 (two) times daily. 08/23/19  Yes Minus Breeding, MD  atorvastatin (LIPITOR) 20 MG tablet Take 1 tablet (20 mg total) by mouth at bedtime. 10/18/18  Yes Stacks, Cletus Gash, MD  Carboxymethylcellul-Glycerin (CLEAR EYES FOR DRY EYES) 1-0.25 % SOLN Place 1 drop into both eyes daily as needed (irritation).    Yes [provider]  clotrimazole-betamethasone (LOTRISONE) cream Apply 1 application topically 2 (two)  times daily. 09/18/19  Yes Rakes, Connye Burkitt, FNP  diltiazem (CARDIZEM) 30 MG tablet Take 1 tablet (30 mg total) by mouth every 8 (eight) hours. 09/26/19  Yes Barton Dubois, MD  docusate sodium (COLACE) 100 MG capsule Take 1 capsule (100 mg total) by mouth 2 (two) times daily. 09/26/19  Yes Barton Dubois, MD  esomeprazole (NEXIUM) 40 MG capsule TAKE 1 CAPSULE BY MOUTH ONCE DAILY. 10/02/19  Yes [name], MD  ferrous sulfate 324 (65 Fe) MG TBEC Take 324 mg by mouth every morning.    Yes [provider]  furosemide (LASIX) 40 MG tablet Take 2 tablets (80  mg total) by mouth daily. Patient taking differently: Take 40 mg by mouth 2 (two) times daily.  09/20/19  Yes Minus Breeding, MD  Insulin Detemir (LEVEMIR FLEXTOUCH) 100 UNIT/ML Pen INJECT 24 UNITS EACH MORNING 09/27/19  Yes Stacks, Cletus Gash, MD  insulin lispro (HUMALOG) 100 UNIT/ML injection Check glucose before each meal (TID). If over 200 take 5 units. If over 300 take 8 units. If over 400 take 10 units. 09/27/19  Yes [name], MD  magnesium hydroxide (MILK OF MAGNESIA) 400 MG/5ML suspension Take 30 mLs by mouth daily as needed for moderate constipation. 09/26/19  Yes Barton Dubois, MD  metoprolol succinate (TOPROL-XL) 100 MG 24 hr tablet Take 1 tablet (100 mg total) by mouth daily. For blood pressure control 09/15/19  Yes Stacks, Cletus Gash, MD  Multiple Vitamins-Iron (DAILY VITAMINS/IRON/BETA CAROT PO) Take 1 tablet by mouth at bedtime.   Yes [provider]  nitroGLYCERIN (NITROSTAT) 0.4 MG SL tablet PLACE 1 TAB UNDER TONGUE EVERY 3 MINUTES AS DIRECTED UP TO 3 TIMES ASNEEDED FOR CHEST PAIN. 10/02/19  Yes Stacks, Cletus Gash, MD  PARoxetine (PAXIL) 20 MG tablet TAKE 1 TABLET BY MOUTH EVERY MORNING. 10/02/19  Yes Stacks, Cletus Gash, MD  polyethylene glycol (MIRALAX) 17 g packet Take 17 g by mouth 2 (two) times daily. 09/26/19  Yes Barton Dubois, MD  ramipril (ALTACE) 5 MG capsule Take 1 capsule (5 mg total) by mouth daily. 09/06/19   Yes [name], MD    Family History Family History  Problem Relation Age of Onset  . Cancer Brother        porstate  . Early death Sister   . Heart disease Brother   . Heart disease Brother   . Heart attack Brother   . Heart disease Brother   . Heart attack Brother   . Diabetes Sister   . Heart attack Sister   . Diabetes Sister   . Osteoporosis Sister   . Hypertension Sister   . Heart attack Son   . Early death Son   . Pancreatitis Son   . Heart attack Daughter 70    Social History Social History   Tobacco Use  . Smoking status: Never Smoker  . Smokeless tobacco: Never Used  Substance Use Topics  . Alcohol use: No  . Drug use: No     Allergies   Advil [ibuprofen], Heparin, and Propoxyphene n-acetaminophen   Review of Systems Review of Systems  Unable to perform ROS: Mental status change     Physical Exam Updated Vital Signs BP (!) 170/114   Pulse 96   Temp (!) 97.5 F (36.4 C) (Oral)   Resp (!) 24   Ht 5\' 6"  (1.676 m)   Wt 66.2 kg   SpO2 96%   BMI 23.57 kg/m   Physical Exam Vitals signs and nursing note reviewed.  Constitutional:      Appearance: She is well-developed.     Comments: Conversant, confused  HENT:     Head: Normocephalic and atraumatic.  Eyes:     Conjunctiva/sclera: Conjunctivae normal.  Neck:     Musculoskeletal: Neck supple.  Cardiovascular:     Rate and Rhythm: Tachycardia present.     Comments: Irregularly irregular Pulmonary:     Effort: Pulmonary effort is normal.     Breath sounds: Normal breath sounds.  Abdominal:     General: Bowel sounds are normal.     Palpations: Abdomen is soft.  Musculoskeletal: Normal range of motion.  Skin:    General: Skin  is warm and dry.  Neurological:     Comments: Moves arms and legs  Psychiatric:     Comments: More confused than normal.      ED Treatments / Results  Labs (all labs ordered are listed, but only abnormal results are displayed) Labs Reviewed   COMPREHENSIVE METABOLIC PANEL - Abnormal; Notable for the following components:      Result Value   Chloride 97 (*)    Glucose, Bld 483 (*)    BUN 42 (*)    Creatinine, Ser 1.65 (*)    GFR calc non Af Amer 28 (*)    GFR calc Af Amer 32 (*)    All other components within normal limits  CBC - Abnormal; Notable for the following components:   Hemoglobin 11.5 (*)    MCHC 29.6 (*)    RDW 15.7 (*)    Platelets 122 (*)    All other components within normal limits  CBG MONITORING, ED - Abnormal; Notable for the following components:   Glucose-Capillary 449 (*)    All other components within normal limits  URINE CULTURE  SARS CORONAVIRUS 2 (TAT 6-24 HRS)  URINALYSIS, ROUTINE W REFLEX MICROSCOPIC    EKG EKG Interpretation  Date/Time:  Tuesday October 03 2019 10:55:29 EDT Ventricular Rate:  117 PR Interval:    QRS Duration: 136 QT Interval:  340 QTC Calculation: 474 R Axis:   -24 Text Interpretation: Atrial fibrillation with rapid ventricular response Right bundle branch block Abnormal ECG Confirmed by Nat Christen [phone]) on 10/03/2019 12:09:46 PM   Radiology No results found.  Procedures Procedures (including critical care time)  Medications Ordered in ED Medications  sodium chloride flush (NS) 0.9 % injection 3 mL (3 mLs Intravenous Given 10/03/19 1204)  sodium chloride 0.9 % bolus 500 mL (500 mLs Intravenous New Bag/Given 10/03/19 1315)  cefTRIAXone (ROCEPHIN) 1 g in sodium chloride 0.9 % 100 mL IVPB (0 g Intravenous Stopped 10/03/19 1359)  insulin aspart (novoLOG) injection 5 Units (5 Units Subcutaneous Given 10/03/19 1317)     Initial Impression / Assessment and Plan / ED Course  I have reviewed the triage vital signs and the nursing notes.  Pertinent labs & imaging results that were available during my care of the patient were reviewed by me and considered in my medical decision making (see chart for details).        Chief complaint altered mental status.   Suspect urinary tract infection.  Fluids, IV Rocephin, urine culture.  1500:  Disc c Dr Stark Jock  Final Clinical Impressions(s) / ED Diagnoses   Final diagnoses:  Altered mental status, unspecified altered mental status type    ED Discharge Orders    None       Nat Christen, MD 10/03/19 1442

## 2019-10-03 NOTE — ED Notes (Signed)
Per Lacinda Axon, MD pt not to have blood cultures or lactic acid drawn at this time and to admin Rocephin

## 2019-10-03 NOTE — ED Notes (Signed)
This RN spoke with Delo, MD re: pt HR remaining elevated in A fib post a 500 mL NS bolus, verbal order given to admin another 500 mL NS bolus and reassess

## 2019-10-03 NOTE — ED Triage Notes (Signed)
Pt brought to ED by daughter who reports pt has had AMS x 2 days. Daughter reports pt has been confused about things, denies fever. Pt alert and oriented name and birthdate in triage.

## 2019-10-03 NOTE — ED Notes (Signed)
Pt restless in bed 

## 2019-10-03 NOTE — ED Notes (Signed)
Pts daughter Ermal Brzozowski requests to be updated on plan care and was updated via phone about pt to be admitted to a step down unit, per the pts daughter she does not have a POA from complete, but she lives with her and takes care of her

## 2019-10-03 NOTE — H&P (Addendum)
History and Physical    [name] ASN:[id number] DOB: 1931/11/20 DOA: 10/03/2019  PCP: [name], MD  Patient coming from: Home  I have personally briefly reviewed patient's old medical records in [place]  Chief Complaint: Altered mental status  HPI: [name] is a 85 y.o. female with medical history significant for diabetes mellitus, hypertension, depression, diastolic CHF, atrial fibrillation, coronary artery disease, CVA.  History is limited as patient has baseline slurred speech, and acute confusion.  She answers a few questions appropriately on review of systems, unable to give me details of the events of today, and keeps calling her daughter's name [name]. Most of the history is obtained from chart review and from daughter- [name] on the phone.  Daughter reports patient has been confused for the past 2 days, like patient standing in front of the bathroom and not knowing what the pattern is.  No fevers.  Patient denies difficulty breathing or cough.  She denies vomiting no abdominal pain. No COVID contacts.  Daughter reports chronic lower extremity swelling that has somewhat improved.  No vomiting, no loose stools.  She has been compliant with Lasix and home insulins.  She reports good oral intake.  Recent hospitalization 10/16-10/20 for decompensated CHF.  ED Course: On my evaluation patient is tachycardic to 140s.  Blood pressure systolic up to 193X- 902I.  O2 sats greater 93% on room air.  WBC 6.2.  Creatinine at baseline 1.65.  UA few bacteria moderate leukocytes 21-50 WBC.  Glucose 483.  EKG shows atrial fibrillation.  Patient was given 1 L bolus in ED, IV ceftriaxone.  Review of Systems: As per HPI all other systems reviewed and negative.  Past Medical History:  Diagnosis Date  . Anemia   . Anxiety   . Aortic stenosis   . Arthritis    back, knees, and hips  . Asthma    with allergies  . Atrial fibrillation ([place])   . Balance problems   . CAD  (coronary artery disease)   . Carotid stenosis   . CHF (congestive heart failure) ([place])    EF preserved Echo 2012  . CKD (chronic kidney disease)   . 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 Dementia ([place])    "a little"  . Depression   . Diabetes mellitus    type 2  . GERD (gastroesophageal reflux disease)   . Hypertension   . Iron deficiency anemia 05/27/2011  . Myocardial infarction ([place])   . Renal insufficiency   . Scoliosis   . Vertigo    "when sugar gets low"  . Vision loss    left eye-"half blind"    Past Surgical History:  Procedure Laterality Date  . APPENDECTOMY     with hysterectomy  . CATARACT EXTRACTION Bilateral 5 years ago  . CHOLECYSTECTOMY    . COLONOSCOPY  2005   Dr. Amedeo Plenty: Diverticulosis  . COLONOSCOPY WITH PROPOFOL N/A 02/14/2019   Procedure: COLONOSCOPY WITH PROPOFOL;  Surgeon: Danie Binder, MD;  [facility];  Service: Endoscopy;  Laterality: N/A;  . CORONARY ANGIOPLASTY     prior to 2006 5 stents  . CORONARY ARTERY BYPASS GRAFT  2006  . I & D of [place]  April 2013  . KNEE SURGERY Right   . PACEMAKER IMPLANT N/A 07/24/2019   Procedure: PACEMAKER IMPLANT;  Surgeon: Evans Lance, MD;  Location: [facility] INVASIVE CV  LAB;  Service: Cardiovascular;  Laterality: N/A;  . PACEMAKER INSERTION    . POLYPECTOMY  02/14/2019   Procedure: POLYPECTOMY;  Surgeon: Danie Binder, MD;  Location: [facility];  Service: Endoscopy;;  cecal polyp  . TOTAL ABDOMINAL HYSTERECTOMY  ~85 years old   complete, with tumor removal  . TOTAL KNEE ARTHROPLASTY Right 08/13/2015   Procedure: RIGHT  TOTAL KNEE ARTHROPLASTY;  Surgeon: Paralee Cancel, MD;  Location: [facility];  Service: Orthopedics;  Laterality: Right;     reports that she has never smoked. She has never used smokeless tobacco. She reports that she does not drink alcohol or use drugs.  Allergies  Allergen Reactions  . Advil  [Ibuprofen] Swelling  . Heparin Other (See Comments)    Confusion, hallucinations.   . Propoxyphene N-Acetaminophen Other (See Comments)    Numbness all over. "floating" sensation.    Family History  Problem Relation Age of Onset  . Cancer Brother        porstate  . Early death Sister   . Heart disease Brother   . Heart disease Brother   . Heart attack Brother   . Heart disease Brother   . Heart attack Brother   . Diabetes Sister   . Heart attack Sister   . Diabetes Sister   . Osteoporosis Sister   . Hypertension Sister   . Heart attack Son   . Early death Son   . Pancreatitis Son   . Heart attack Daughter 37    Prior to Admission medications   Medication Sig Start Date End Date Taking? Authorizing Provider  albuterol (PROVENTIL HFA;VENTOLIN HFA) 108 (90 Base) MCG/ACT inhaler Inhale 2 puffs into the lungs every 6 (six) hours as needed for wheezing or shortness of breath. 01/20/19  Yes Stacks, Cletus Gash, MD  apixaban (ELIQUIS) 2.5 MG TABS tablet Take 1 tablet (2.5 mg total) by mouth 2 (two) times daily. 08/23/19  Yes Minus Breeding, MD  atorvastatin (LIPITOR) 20 MG tablet Take 1 tablet (20 mg total) by mouth at bedtime. 10/18/18  Yes Stacks, Cletus Gash, MD  Carboxymethylcellul-Glycerin (CLEAR EYES FOR DRY EYES) 1-0.25 % SOLN Place 1 drop into both eyes daily as needed (irritation).    Yes [provider]  clotrimazole-betamethasone (LOTRISONE) cream Apply 1 application topically 2 (two) times daily. 09/18/19  Yes Rakes, Connye Burkitt, FNP  diltiazem (CARDIZEM) 30 MG tablet Take 1 tablet (30 mg total) by mouth every 8 (eight) hours. 09/26/19  Yes Barton Dubois, MD  docusate sodium (COLACE) 100 MG capsule Take 1 capsule (100 mg total) by mouth 2 (two) times daily. 09/26/19  Yes Barton Dubois, MD  esomeprazole (NEXIUM) 40 MG capsule TAKE 1 CAPSULE BY MOUTH ONCE DAILY. 10/02/19  Yes [name], MD  ferrous sulfate 324 (65 Fe) MG TBEC Take 324 mg by mouth every morning.    Yes  [provider]  furosemide (LASIX) 40 MG tablet Take 2 tablets (80 mg total) by mouth daily. Patient taking differently: Take 40 mg by mouth 2 (two) times daily.  09/20/19  Yes Minus Breeding, MD  Insulin Detemir (LEVEMIR FLEXTOUCH) 100 UNIT/ML Pen INJECT 24 UNITS EACH MORNING 09/27/19  Yes Stacks, Cletus Gash, MD  insulin lispro (HUMALOG) 100 UNIT/ML injection Check glucose before each meal (TID). If over 200 take 5 units. If over 300 take 8 units. If over 400 take 10 units. 09/27/19  Yes Stacks, Cletus Gash, MD  magnesium hydroxide (MILK OF MAGNESIA) 400 MG/5ML suspension Take 30 mLs by mouth daily as needed  for moderate constipation. 09/26/19  Yes Barton Dubois, MD  metoprolol succinate (TOPROL-XL) 100 MG 24 hr tablet Take 1 tablet (100 mg total) by mouth daily. For blood pressure control 09/15/19  Yes Stacks, Cletus Gash, MD  Multiple Vitamins-Iron (DAILY VITAMINS/IRON/BETA CAROT PO) Take 1 tablet by mouth at bedtime.   Yes [provider]  nitroGLYCERIN (NITROSTAT) 0.4 MG SL tablet PLACE 1 TAB UNDER TONGUE EVERY 3 MINUTES AS DIRECTED UP TO 3 TIMES ASNEEDED FOR CHEST PAIN. 10/02/19  Yes Stacks, Cletus Gash, MD  PARoxetine (PAXIL) 20 MG tablet TAKE 1 TABLET BY MOUTH EVERY MORNING. 10/02/19  Yes Stacks, Cletus Gash, MD  polyethylene glycol (MIRALAX) 17 g packet Take 17 g by mouth 2 (two) times daily. 09/26/19  Yes Barton Dubois, MD  ramipril (ALTACE) 5 MG capsule Take 1 capsule (5 mg total) by mouth daily. 09/06/19  Barnabas Lister, MD    Physical Exam: Vitals:   10/03/19 1145 10/03/19 1200 10/03/19 1230 10/03/19 1400  BP:  (!) 194/112 (!) 194/116 (!) 170/114  Pulse: (!) 118 (!) 103 (!) 110 96  Resp: (!) 25 (!) 26 (!) 23 (!) 24  Temp:      TempSrc:      SpO2: 93% 94% 95% 96%  Weight:      Height:        Constitutional: Calm, comfortable, confused. Vitals:   10/03/19 1145 10/03/19 1200 10/03/19 1230 10/03/19 1400  BP:  (!) 194/112 (!) 194/116 (!) 170/114  Pulse: (!) 118 (!) 103 (!) 110  96  Resp: (!) 25 (!) 26 (!) 23 (!) 24  Temp:      TempSrc:      SpO2: 93% 94% 95% 96%  Weight:      Height:       Eyes: PERRL, lids and conjunctivae normal ENMT: Mucous membranes are very dry.  Posterior pharynx clear of any exudate or lesions. Neck: normal, supple, no masses, no thyromegaly Respiratory: clear to auscultation bilaterally, no wheezing, no crackles. Normal respiratory effort. No accessory muscle use.  Pacemaker pocket left upper chest.,  Well-healed surgical scar, no tenderness or erythema. Cardiovascular: irregular rate and rhythm, tachycardic, no murmurs / rubs / gallops.  Trace right lower extremity edema without erythema pain,.  Bilateral lower extremities are slightly cool to touch. Abdomen: no tenderness, no masses palpated. No hepatosplenomegaly. Bowel sounds positive.  Musculoskeletal: no clubbing / cyanosis. No joint deformity upper and lower extremities. Good ROM, no contractures. Normal muscle tone.  Skin: no rashes, lesions, ulcers. No induration Neurologic: Baseline slurred speech., a bit challenging to understand.  Otherwise cranial nerves grossly intact.  Strength 5/5 in all 4.  Psychiatric: Alert and oriented to person only unable to tell me where she is.. Normal mood.   Labs on Admission: I have personally reviewed following labs and imaging studies  CBC: Recent Labs  Lab 09/27/19 1052 10/03/19 1159  WBC 4.8 6.2  NEUTROABS 3.6  --   HGB 10.6* 11.5*  HCT 34.1 38.9  MCV 90 92.8  PLT 149* 163*   Basic Metabolic Panel: Recent Labs  Lab 09/27/19 1052 10/03/19 1159  NA 147* 136  K 4.8 4.8  CL 105 97*  CO2 24 27  GLUCOSE 62* 483*  BUN 48* 42*  CREATININE 1.71* 1.65*  CALCIUM 9.4 9.5    Liver Function Tests: Recent Labs  Lab 09/27/19 1052 10/03/19 1159  AST 20 15  ALT 12 18  ALKPHOS 51 52  BILITOT 0.3 1.1  PROT 5.8* 6.5  ALBUMIN 3.2*  3.5   CBG: Recent Labs  Lab 10/03/19 1055  GLUCAP 449*   Urine analysis:    Component Value  Date/Time   COLORURINE YELLOW 10/03/2019 1205   APPEARANCEUR HAZY (A) 10/03/2019 1205   APPEARANCEUR Clear 09/27/2017 1056   LABSPEC 1.011 10/03/2019 1205   PHURINE 5.0 10/03/2019 1205   GLUCOSEU >=500 (A) 10/03/2019 1205   HGBUR MODERATE (A) 10/03/2019 1205   BILIRUBINUR NEGATIVE 10/03/2019 1205   BILIRUBINUR Negative 09/27/2017 Day 10/03/2019 1205   PROTEINUR 100 (A) 10/03/2019 1205   UROBILINOGEN negative 02/03/2016 1516   UROBILINOGEN 0.2 10/17/2015 1915   NITRITE NEGATIVE 10/03/2019 1205   LEUKOCYTESUR MODERATE (A) 10/03/2019 1205    Radiological Exams on Admission: No results found.  EKG: Independently reviewed.  Atrial fibrillation rate 117.  Old RBBB.  Assessment/Plan Active Problems:   Altered mental status   Metabolic encephalopathy- confusion.  Likely secondary to UTI and dehydration from hypoglycemia and diuretics.  UA suggestive of infection.  WBC 6.2.  Afebrile.  But with tachycardia and tachypnea.  Meeting SIRS criteria., but tachycardia may be from dehydration as she appears very dehydrated. -Continue IV ceftriaxone - 1L bolus N/s given, cont N/s 100cc/hr x 15hrs -Follow-up urine cultures, obtain blood cultures,  -Check lactic acid -Portable chest x-ray -Head CT -SARS-CoV-2 test pending -BMP, CBC a.m.  Atrial fibrillation with RVR, -rates now persistently in the 140s tachycardia likely driven by dehydration and possibly UTI.  On metoprolol, Cardizem and anticoagulated with Eliquis.  No chest pain. - Resume home metoprolol -Resume home Eliquis -No response to 5 mg IV of metoprolol, will start Cardizem gtt, home p.o. Cardizem held for now  Hyperglycemia in diabetic- glucose 483. Anion gap 483, bicarb 27.  10/17 Hgba1c 7.5.  - Hydrate - Resume home levemir - SSI  Diastolic CHF- Stable and compensated. Needs IVF.  Last echo 06/2019 EF 60 to 66%, LV diastolic parameters consistent with pseudonormalization. -Hold home Lasix  CKD  3-creatinine 1.6 about baseline. -Hydrate, hold home Lasix 80 mg daily  Hypertension-elevated. -Resume home metoprolol Cardizem, ramipril  CVA with residual slurred speech. -Resume home Eliquis, statins.  History of tachycardia-bradycardia syndrome- s/p pacemaker 07/24/2019.  DVT prophylaxis: Eliquis Code Status: Full code -confirmed with daughter and on the phone. Family Communication: Daughter Insurance underwriter on the phone. Disposition Plan: Per rounding team Consults called: None Admission status: Inpatient, stepdown.   Bethena Roys MD [facility]  10/03/2019, 5:12 PM

## 2019-10-04 ENCOUNTER — Ambulatory Visit: Payer: Medicare Other | Admitting: Family Medicine

## 2019-10-04 DIAGNOSIS — A419 Sepsis, unspecified organism: Secondary | ICD-10-CM

## 2019-10-04 DIAGNOSIS — Z950 Presence of cardiac pacemaker: Secondary | ICD-10-CM | POA: Diagnosis not present

## 2019-10-04 DIAGNOSIS — N390 Urinary tract infection, site not specified: Secondary | ICD-10-CM

## 2019-10-04 DIAGNOSIS — I4891 Unspecified atrial fibrillation: Secondary | ICD-10-CM | POA: Diagnosis not present

## 2019-10-04 DIAGNOSIS — G9341 Metabolic encephalopathy: Secondary | ICD-10-CM

## 2019-10-04 DIAGNOSIS — Z7189 Other specified counseling: Secondary | ICD-10-CM

## 2019-10-04 DIAGNOSIS — R4182 Altered mental status, unspecified: Secondary | ICD-10-CM | POA: Diagnosis not present

## 2019-10-04 DIAGNOSIS — I350 Nonrheumatic aortic (valve) stenosis: Secondary | ICD-10-CM

## 2019-10-04 DIAGNOSIS — I25708 Atherosclerosis of coronary artery bypass graft(s), unspecified, with other forms of angina pectoris: Secondary | ICD-10-CM

## 2019-10-04 DIAGNOSIS — N183 Chronic kidney disease, stage 3 unspecified: Secondary | ICD-10-CM

## 2019-10-04 DIAGNOSIS — I5032 Chronic diastolic (congestive) heart failure: Secondary | ICD-10-CM

## 2019-10-04 LAB — BASIC METABOLIC PANEL
Anion gap: 9 (ref 5–15)
BUN: 36 mg/dL — ABNORMAL HIGH (ref 8–23)
CO2: 28 mmol/L (ref 22–32)
Calcium: 9.2 mg/dL (ref 8.9–10.3)
Chloride: 104 mmol/L (ref 98–111)
Creatinine, Ser: 1.26 mg/dL — ABNORMAL HIGH (ref 0.44–1.00)
GFR calc Af Amer: 45 mL/min — ABNORMAL LOW (ref >60)
GFR calc non Af Amer: 39 mL/min — ABNORMAL LOW (ref >60)
Glucose, Bld: 125 mg/dL — ABNORMAL HIGH (ref 70–99)
Potassium: 3.6 mmol/L (ref 3.5–5.1)
Sodium: 141 mmol/L (ref 135–145)

## 2019-10-04 LAB — GLUCOSE, CAPILLARY
Glucose-Capillary: 212 mg/dL — ABNORMAL HIGH (ref 70–99)
Glucose-Capillary: 105 mg/dL — ABNORMAL HIGH (ref 70–99)
Glucose-Capillary: 68 mg/dL — ABNORMAL LOW (ref 70–99)
Glucose-Capillary: 117 mg/dL — ABNORMAL HIGH (ref 70–99)
Glucose-Capillary: 141 mg/dL — ABNORMAL HIGH (ref 70–99)
Glucose-Capillary: 77 mg/dL (ref 70–99)

## 2019-10-04 LAB — CBC
HCT: 35 % — ABNORMAL LOW (ref 36.0–46.0)
Hemoglobin: 10.5 g/dL — ABNORMAL LOW (ref 12.0–15.0)
MCH: 27.6 pg (ref 26.0–34.0)
MCHC: 30 g/dL (ref 30.0–36.0)
MCV: 91.9 fL (ref 80.0–100.0)
Platelets: 111 10*3/uL — ABNORMAL LOW (ref 150–400)
RBC: 3.81 MIL/uL — ABNORMAL LOW (ref 3.87–5.11)
RDW: 15.7 % — ABNORMAL HIGH (ref 11.5–15.5)
WBC: 5.8 10*3/uL (ref 4.0–10.5)
nRBC: 0 % (ref 0.0–0.2)

## 2019-10-04 LAB — MRSA PCR SCREENING: MRSA by PCR: NEGATIVE

## 2019-10-04 MED ORDER — SODIUM CHLORIDE 0.9 % IV SOLN: INTRAVENOUS | Status: AC

## 2019-10-04 MED ORDER — FUROSEMIDE 40 MG PO TABS
40.0000 mg | ORAL_TABLET | Freq: Two times a day (BID) | ORAL | Status: DC
  Administered 2019-10-04 – 2019-10-06 (×4): 40 mg via ORAL
  Filled 2019-10-04 (×4): qty 1

## 2019-10-04 MED ORDER — INSULIN ASPART 100 UNIT/ML ~~LOC~~ SOLN
0.0000 [IU] | Freq: Three times a day (TID) | SUBCUTANEOUS | Status: DC
  Administered 2019-10-04: 1 [IU] via SUBCUTANEOUS
  Administered 2019-10-05: 12:00:00 5 [IU] via SUBCUTANEOUS
  Administered 2019-10-05: 09:00:00 1 [IU] via SUBCUTANEOUS
  Administered 2019-10-05: 18:00:00 5 [IU] via SUBCUTANEOUS
  Administered 2019-10-06 – 2019-10-07 (×2): 1 [IU] via SUBCUTANEOUS

## 2019-10-04 MED ORDER — CHLORHEXIDINE GLUCONATE CLOTH 2 % EX PADS
6.0000 | MEDICATED_PAD | Freq: Every day | CUTANEOUS | Status: DC
  Administered 2019-10-04 – 2019-10-06 (×3): 6 via TOPICAL

## 2019-10-04 MED ORDER — INSULIN ASPART 100 UNIT/ML ~~LOC~~ SOLN
2.0000 [IU] | Freq: Three times a day (TID) | SUBCUTANEOUS | Status: DC
  Administered 2019-10-04 – 2019-10-05 (×3): 2 [IU] via SUBCUTANEOUS

## 2019-10-04 MED ORDER — LORAZEPAM 0.5 MG PO TABS
0.2500 mg | ORAL_TABLET | Freq: Four times a day (QID) | ORAL | Status: DC | PRN
  Administered 2019-10-06: 0.25 mg via ORAL
  Filled 2019-10-04 (×2): qty 1

## 2019-10-04 MED ORDER — INSULIN DETEMIR 100 UNIT/ML ~~LOC~~ SOLN
16.0000 [IU] | Freq: Every day | SUBCUTANEOUS | Status: DC
  Administered 2019-10-05 – 2019-10-06 (×2): 16 [IU] via SUBCUTANEOUS
  Filled 2019-10-04 (×5): qty 0.16

## 2019-10-04 NOTE — Progress Notes (Addendum)
PROGRESS NOTE    [name]  NWG:[id number]  DOB: 06/19/1931  DOA: 10/03/2019 PCP: [name], MD   Brief Admission Hx: 85 year old female with diastolic CHF, hypertension, diabetes mellitus, chronic atrial fibrillation, CAD, CVA presented with acute confusion.  Patient was in A. fib RVR.  Patient was noted to have a UTI and was dehydrated.  She had been recently hospitalized 10/16 through 10/20 for decompensated CHF.  MDM/Assessment & Plan:   Acute metabolic encephalopathyslowly improving with treatments.  Likely secondary to UTI and dehydration.  Follow clinically. UTIcontinue ceftriaxone IV daily pending urine cultures. Mild dehydrationpatient has been hydrated with IV fluids which will be discontinued now. A. fib with RVRpatient remains with elevated heart rate in the 140s, patient is on IV diltiazem at 15 mg/h.  She has been resumed on her home metoprolol 100 mg daily.  Cardiology is seen her today and we will transition her to oral Cardizem when appropriate. Diastolic CHFchronic pulmonary edema - currently compensated will DC IV fluids as she is clinically improving.  Temporarily held home Lasix but likely can resume 10/05/2019. Essential hypertensionshe has been resumed on home metoprolol.  She remains on IV Cardizem infusion. History of tachybradycardia syndromeshe is status post pacemaker 07/24/2019. HypoglycemiaI have reduced her basal insulin doses.  Monitor CBG closely.  She is eating this morning which should improve her blood sugars.  DVT prophylaxis: Apixaban Code Status: Full Family Communication: Daughter Disposition Plan: Continue stepdown ICU care   Consultants: N/A  Procedures: N/A  Antimicrobials: Ceftriaxone 10/03/2019  Subjective: Patient less confused today and eating.  No specific complaints.  Objective: Vitals:   10/04/19 0600 10/04/19 0729 10/04/19 0800 10/04/19 0900  BP: (!) 116/93  129/89   Pulse: (!) 117 (!) 141 (!) 128   Resp:  (!) 27 (!) 25 20 17   Temp:  (!) 97.5 F (36.4 C)    TempSrc:  Oral    SpO2: 95% 96% 97% 98%  Weight:      Height:        Intake/Output Summary (Last 24 hours) at 10/04/2019 1037 Last data filed at 10/04/2019 0500 Gross per 24 hour  Intake 751.33 ml  Output 150 ml  Net 601.33 ml   Filed Weights   10/03/19 1050 10/03/19 2041 10/04/19 0500  Weight: 66.2 kg 68.9 kg 68.9 kg     REVIEW OF SYSTEMS  As per history otherwise all reviewed and reported negative  Exam:  General exam: Elderly female awake and alert in no distress cooperative. Respiratory system: Clear. No increased work of breathing. Cardiovascular system: Irregularly irregular S1 & S2 heard.  Tachycardic rate.  No JVD, murmurs, gallops, clicks or pedal edema. Gastrointestinal system: Abdomen is nondistended, soft and nontender. Normal bowel sounds heard. Central nervous system: Alert and oriented. No focal neurological deficits. Extremities: 1+ edema bilateral lower extremities.  Data Reviewed: Basic Metabolic Panel: Recent Labs  Lab 09/27/19 1052 10/03/19 1159 10/04/19 0431  NA 147* 136 141  K 4.8 4.8 3.6  CL 105 97* 104  CO2 24 27 28   GLUCOSE 62* 483* 125*  BUN 48* 42* 36*  CREATININE 1.71* 1.65* 1.26*  CALCIUM 9.4 9.5 9.2   Liver Function Tests: Recent Labs  Lab 09/27/19 1052 10/03/19 1159  AST 20 15  ALT 12 18  ALKPHOS 51 52  BILITOT 0.3 1.1  PROT 5.8* 6.5  ALBUMIN 3.2* 3.5   No results for input(s): LIPASE, AMYLASE in the last 168 hours. No results for input(s): AMMONIA in the last  168 hours. CBC: Recent Labs  Lab 09/27/19 1052 10/03/19 1159 10/04/19 0431  WBC 4.8 6.2 5.8  NEUTROABS 3.6  --   --   HGB 10.6* 11.5* 10.5*  HCT 34.1 38.9 35.0*  MCV 90 92.8 91.9  PLT 149* 122* 111*   Cardiac Enzymes: No results for input(s): CKTOTAL, CKMB, CKMBINDEX, TROPONINI in the last 168 hours. CBG (last 3)  Recent Labs    10/04/19 0110 10/04/19 0503 10/04/19 0731  GLUCAP 212* 105* 68*    Recent Results (from the past 240 hour(s))  SARS CORONAVIRUS 2 (TAT 6-24 HRS) Nasopharyngeal Nasopharyngeal Swab     Status: None   Collection Time: 10/03/19  1:59 PM   Specimen: Nasopharyngeal Swab  Result Value Ref Range Status   SARS Coronavirus 2 NEGATIVE NEGATIVE Final    Comment: (NOTE) SARS-CoV-2 target nucleic acids are NOT DETECTED. The SARS-CoV-2 RNA is generally detectable in upper and lower respiratory specimens during the acute phase of infection. Negative results do not preclude SARS-CoV-2 infection, do not rule out co-infections with other pathogens, and should not be used as the sole basis for treatment or other patient management decisions. Negative results must be combined with clinical observations, patient history, and epidemiological information. The expected result is Negative. Fact Sheet for Patients: SugarRoll.be Fact Sheet for Healthcare Providers: https://www.woods-mathews.com/ This test is not yet approved or cleared by the [place] FDA and  has been authorized for detection and/or diagnosis of SARS-CoV-2 by FDA under an Emergency Use Authorization (EUA). This EUA will remain  in effect (meaning this test can be used) for the duration of the COVID-19 declaration under Section 56 4(b)(1) of the Act, 21 U.S.C. section 360bbb-3(b)(1), unless the authorization is terminated or revoked sooner. Performed at [facility], [place] [address] [place], [place] [address]   Culture, blood (routine x 2)     Status: None (Preliminary result)   Collection Time: 10/03/19  5:13 PM   Specimen: Right Antecubital; Blood  Result Value Ref Range Status   Specimen Description RIGHT ANTECUBITAL  Final   Special Requests   Final    BOTTLES DRAWN AEROBIC AND ANAEROBIC Blood Culture adequate volume   Culture   Final    NO GROWTH < 24 HOURS Performed at [facility], [address], [place], [place] [address]    Report Status  PENDING  Incomplete  Culture, blood (routine x 2)     Status: None (Preliminary result)   Collection Time: 10/03/19  5:13 PM   Specimen: BLOOD RIGHT HAND  Result Value Ref Range Status   Specimen Description BLOOD RIGHT HAND  Final   Special Requests   Final    BOTTLES DRAWN AEROBIC AND ANAEROBIC Blood Culture adequate volume   Culture   Final    NO GROWTH < 24 HOURS Performed at [facility], [address], [place], [place] [address]    Report Status PENDING  Incomplete  MRSA PCR Screening     Status: None   Collection Time: 10/03/19  8:30 PM   Specimen: Nasal Mucosa; Nasopharyngeal  Result Value Ref Range Status   MRSA by PCR NEGATIVE NEGATIVE Final    Comment:        The GeneXpert MRSA Assay (FDA approved for NASAL specimens only), is one component of a comprehensive MRSA colonization surveillance program. It is not intended to diagnose MRSA infection nor to guide or monitor treatment for MRSA infections. Performed at [facility], [address], [place], [place] [address]  Studies: Ct Head Wo Contrast  Result Date: 10/03/2019 CLINICAL DATA:  Slurred speech and confusion. EXAM: CT HEAD WITHOUT CONTRAST TECHNIQUE: Contiguous axial images were obtained from the base of the skull through the vertex without intravenous contrast. COMPARISON:  09/05/2019 FINDINGS: Brain: No discernible change since then. Generalized brain atrophy. Old cerebellar infarctions on the right. Old right occipital infarction. Extensive chronic small-vessel ischemic changes throughout the brain. No sign of acute or subacute infarction, mass lesion, hemorrhage, hydrocephalus or extra-axial collection. Vascular: There is atherosclerotic calcification of the major vessels at the base of the brain. Skull: Negative Sinuses/Orbits: Mucosal inflammatory changes of the maxillary sinuses. Previous functional endoscopic sinus surgery. Orbits negative. Other: None IMPRESSION: No acute finding or change since the  study of 09/05/2019. Atrophy and extensive chronic small-vessel ischemic changes. 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 Electronically Signed   By: Nelson Chimes M.D.   On: 10/03/2019 19:06   Dg Chest Port 1 View  Result Date: 10/03/2019 CLINICAL DATA:  Altered mental status for 2 days EXAM: PORTABLE CHEST 1 VIEW COMPARISON:  September 27, 2019 FINDINGS: The mediastinal contour and cardiac silhouette are stable. Cardiac pacemaker is unchanged. There is pulmonary edema. Patchy consolidation with right pleural effusion is identified in the right lung base. The bony structures are stable. IMPRESSION: Mild pulmonary edema. Right pleural effusion. Patchy consolidation of right lung base, suspicious for pneumonia. Electronically Signed   By: Abelardo Diesel M.D.   On: 10/03/2019 16:47     Scheduled Meds:  apixaban  2.5 mg Oral BID   atorvastatin  20 mg Oral QHS   Chlorhexidine Gluconate Cloth  6 each Topical Daily   insulin aspart  0-9 Units Subcutaneous TID WC   insulin aspart  2 Units Subcutaneous TID WC   insulin detemir  16 Units Subcutaneous Q2200   metoprolol succinate  100 mg Oral Daily   pantoprazole  40 mg Oral Daily   PARoxetine  20 mg Oral q morning - 10a   polyethylene glycol  17 g Oral Daily   ramipril  5 mg Oral Daily   Continuous Infusions:  sodium chloride     cefTRIAXone (ROCEPHIN)  IV     diltiazem (CARDIZEM) infusion 15 mg/hr (10/04/19 0645)    Active Problems:   Altered mental status   Atrial fibrillation with RVR ([place])   Critical care time spent: 35 minutes  Irwin Brakeman, MD [facility] 10/04/2019, 10:37 AM    LOS: 1 day  How to contact the [facility] Attending or Consulting provider [place] or covering provider during after hours [place], for this patient?  Check the care team in [facility] and look for a) attending/consulting [facility] provider listed and b) the [facility] team listed Log into [email or web address] and use [place]'s universal password to access.  If you do not have the password, please contact the hospital operator. Locate the [facility] provider you are looking for under [facility] and page to a number that you can be directly reached. If you still have difficulty reaching the provider, please page the [facility] (Director on Call) for the Hospitalists listed on amion for assistance.

## 2019-10-04 NOTE — Progress Notes (Signed)
Inpatient Diabetes Program Recommendations  AACE/ADA: New Consensus Statement on Inpatient Glycemic Control (2015)  Target Ranges:  Prepandial:   less than 140 mg/dL      Peak postprandial:   less than 180 mg/dL (1-2 hours)      Critically ill patients:  140 - 180 mg/dL   Lab Results  Component Value Date   GLUCAP 68 (L) 10/04/2019   HGBA1C 7.5 (H) 09/23/2019    Review of Glycemic Control Results for DEANA, KROCK (MRN [id number]) as of 10/04/2019 10:27  Ref. Range 10/03/2019 21:28 10/04/2019 01:10 10/04/2019 05:03 10/04/2019 07:31  Glucose-Capillary Latest Ref Range: 70 - 99 mg/dL 325 (H) 212 (H) 105 (H) 68 (L)   Diabetes history: Type 2 DM Outpatient Diabetes medications: Levemir 24 units QHS, Humalog TID (>200 mg/dL-5 units, >300 mg/dL- 8 units, >400+ -10 units) Current orders for Inpatient glycemic control: Levemir 16 units QHS, Novolog 0-9 units TID, Novolog 2 units TID  Inpatient Diabetes Program Recommendations:    Noted mild hypoglycemia this AM of 68 mg/dL and subsequent insulin changes. In agreement, will follow.   Thanks, Bronson Curb, MSN, RNC-OB Diabetes Coordinator [phone] (8a-5p)

## 2019-10-04 NOTE — Progress Notes (Signed)
Spoke with midlevel regarding patient's CBG and scheduled dose of levemir. Orders to hold levemir tonight.

## 2019-10-04 NOTE — Consult Note (Addendum)
Cardiology Consult    Patient ID: MONE COMMISSO; [id number]; 11-05-1931   Admit date: 10/03/2019 Date of Consult: 10/04/2019  Primary Care Provider: [name], MD Primary Cardiologist: Minus Breeding, MD  Primary Electrophysiologist: Dr. Lovena Le  Patient Profile    [name] is a 85 y.o. female with past medical history of CAD (s/p CABG in 2006), carotid artery stenosis, aortic stenosis, persistent atrial fibrillation, tachy-brady syndrome (s/p Medtronic PPM placement in 07/2019), HTN, HLD, and Type 2 DM, Stage 3 CKD, and prior CVA who is being seen today for the evaluation of atrial fibrillation with RVR at the request of Dr. Wynetta Emery.   History of Present Illness    [name] most followed up with Dr. Percival Spanish in 08/2019 following her recent hospitalization for new onset atrial fibrillation with RVR and tachybradycardia syndrome. She was overall doing well at the time of her visit and reported improvement in her energy level. She had refused to start anticoagulation until this was discussed with Dr. Percival Spanish and she was started on Eliquis 2.5 mg twice daily along with Lopressor being titrated to 75 mg twice daily. She followed up with Dr. Percival Spanish again on 09/20/2019 and reported a variety of symptoms including constipation and worsening hemorrhoids. Was advised to follow-up with her PCP in regards to further evaluation.  She was admitted to [facility] from 09/22/2019 to 09/26/2019 for an acute on chronic diastolic CHF exacerbation. She responded well to IV Lasix during admission and was resumed on Lasix 40 mg twice daily upon discharge. A discharge weight was not recorded but she was at 152 lbs on 09/25/2019 with creatinine improved to 1.43 (had peaked at 2.12 during admission). By review of her discharge summary, she was switched to Toprol-XL 100mg  daily and short-acting Cardizem 30mg  Q8H.   She presented back to [facility] ED on 10/03/2019 for evaluation of AMS for the  past 2 days. She is unable to recall the specific events leading to admission but had been experiencing worsening slurred speech and confused about doing her routine activities per her daughter's report. At the time of this encounter, the patient denies any specific symptoms. Denies any chest pain or palpitations. Says her abdominal pain has improved. Does report having worsening edema for the past few weeks. No specific orthopnea, PND or worsening dyspnea on exertion.   Initial labs showed WBC 6.2, Hgb 11.5, platelets 122, Na+ 136, K+ 4.8 and creatinine 1.65. UA concerning for UTI with culture results pending. COVID negative. Lactic Acid 2.4. CXR showed mild pulmonary edema and right pleural effusion with patchy consolidation along right lung base concerning for PNA. Head CT showed no acute findings but did show atrophy and extensive chronic small-vessel ischemic changes with old infarctions of the right cerebellum and right occipital lobe. EKG shows atrial fibrillation with RVR, HR 117 with known RBBB.   It was thought her metabolic encephalopathy was secondary to UTI and dehydration. Was given a 1L fluid bolus while in the ED and on IVF at 100 mL/hr but reduced to 25mg /hr this morning by the admitting team. She has been started on Rocephin for her UTI. In regards to her atrial fibrillation, she was continued on Toprol-XL 100mg  daily and started on IV Cardizem. Rates overnight have been in the 110's to 130's and she is currently on IV Cardizem at 15 mg/hr. She does have occasional V-pacing on telemetry.      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 Amedeo Plenty: Diverticulosis   COLONOSCOPY WITH PROPOFOL N/A 02/14/2019   Procedure: COLONOSCOPY WITH PROPOFOL;  Surgeon: Danie Binder, MD;  Location: [facility];  Service: Endoscopy;  Laterality: N/A;   CORONARY ANGIOPLASTY     prior to 2006 5 stents   CORONARY ARTERY BYPASS GRAFT  2006   [facility] [place]  April 2013   KNEE SURGERY Right    PACEMAKER IMPLANT N/A 07/24/2019   Procedure: PACEMAKER IMPLANT;  Surgeon: Evans Lance, MD;  Location: [facility] CV LAB;  Service: Cardiovascular;  Laterality: N/A;   PACEMAKER INSERTION     POLYPECTOMY  02/14/2019   Procedure: POLYPECTOMY;  Surgeon: Danie Binder, MD;  Location: [facility];  Service: Endoscopy;;  cecal polyp   TOTAL ABDOMINAL HYSTERECTOMY  ~85 years old   complete, with tumor removal   TOTAL KNEE ARTHROPLASTY Right 08/13/2015   Procedure: RIGHT  TOTAL KNEE ARTHROPLASTY;  Surgeon: Paralee Cancel, MD;  Location: [facility] ORS;  Service: Orthopedics;  Laterality: Right;     Home Medications:  Prior to Admission medications   Medication Sig Start Date End Date Taking? Authorizing Provider  albuterol (PROVENTIL HFA;VENTOLIN HFA) 108 (90 Base) MCG/ACT inhaler Inhale 2 puffs into the lungs every 6 (six) hours as needed for wheezing or shortness of breath. 01/20/19  Yes Stacks, Cletus Gash, MD  apixaban (ELIQUIS) 2.5 MG TABS tablet Take 1  tablet (2.5 mg total) by mouth 2 (two) times daily. 08/23/19  Yes Minus Breeding, MD  atorvastatin (LIPITOR) 20 MG tablet Take 1 tablet (20 mg total) by mouth at bedtime. 10/18/18  Yes Stacks, Cletus Gash, MD  Carboxymethylcellul-Glycerin (CLEAR EYES FOR DRY EYES) 1-0.25 % SOLN Place 1 drop into both eyes daily as needed (irritation).    Yes [provider]  clotrimazole-betamethasone (LOTRISONE) cream Apply 1 application topically 2 (two) times daily. 09/18/19  Yes Rakes, Connye Burkitt, FNP  diltiazem (CARDIZEM) 30 MG tablet Take 1 tablet (30 mg total) by mouth every 8 (eight) hours. 09/26/19  Yes Barton Dubois, MD  docusate sodium (COLACE) 100 MG capsule Take 1 capsule (100 mg total) by mouth 2 (two) times daily. 09/26/19  Yes Barton Dubois, MD  esomeprazole (NEXIUM) 40 MG capsule TAKE 1 CAPSULE BY MOUTH ONCE DAILY. 10/02/19  Yes [name], MD  ferrous sulfate 324 (65  Fe) MG TBEC Take 324 mg by mouth every morning.    Yes [provider]  furosemide (LASIX) 40 MG tablet Take 2 tablets (80 mg total) by mouth daily. Patient taking differently: Take 40 mg by mouth 2 (two) times daily.  09/20/19  Yes Minus Breeding, MD  Insulin Detemir (LEVEMIR FLEXTOUCH) 100 UNIT/ML Pen INJECT 24 UNITS EACH MORNING 09/27/19  Yes Stacks, Cletus Gash, MD  insulin lispro (HUMALOG) 100 UNIT/ML injection Check glucose before each meal (TID). If over 200 take 5 units. If over 300 take 8 units. If over 400 take 10 units. 09/27/19  Yes [name], MD  magnesium hydroxide (MILK OF MAGNESIA) 400 MG/5ML suspension Take 30 mLs by mouth daily as needed for moderate constipation. 09/26/19  Yes Barton Dubois, MD  metoprolol succinate (TOPROL-XL) 100 MG 24 hr tablet Take 1 tablet (100 mg total) by mouth daily. For blood pressure control 09/15/19  Yes Stacks, Cletus Gash, MD  Multiple Vitamins-Iron (DAILY VITAMINS/IRON/BETA CAROT PO) Take 1 tablet by mouth at bedtime.   Yes [provider]  nitroGLYCERIN (NITROSTAT)  0.4 MG SL tablet PLACE 1 TAB UNDER TONGUE EVERY 3 MINUTES AS DIRECTED UP TO 3 TIMES ASNEEDED FOR CHEST PAIN. 10/02/19  Yes Stacks, Cletus Gash, MD  PARoxetine (PAXIL) 20 MG tablet TAKE 1 TABLET BY MOUTH EVERY MORNING. 10/02/19  Yes Stacks, Cletus Gash, MD  polyethylene glycol (MIRALAX) 17 g packet Take 17 g by mouth 2 (two) times daily. 09/26/19  Yes Barton Dubois, MD  ramipril (ALTACE) 5 MG capsule Take 1 capsule (5 mg total) by mouth daily. 09/06/19  Yes [name], MD    Inpatient Medications: Scheduled Meds:  apixaban  2.5 mg Oral BID   atorvastatin  20 mg Oral QHS   Chlorhexidine Gluconate Cloth  6 each Topical Daily   insulin aspart  0-9 Units Subcutaneous TID WC   insulin aspart  2 Units Subcutaneous TID WC   insulin detemir  16 Units Subcutaneous Q2200   metoprolol succinate  100 mg Oral Daily   pantoprazole  40 mg Oral Daily   PARoxetine  20 mg Oral q morning - 10a   polyethylene glycol  17 g Oral Daily   ramipril  5 mg Oral Daily   Continuous Infusions:  sodium chloride     cefTRIAXone (ROCEPHIN)  IV     diltiazem (CARDIZEM) infusion 15 mg/hr (10/04/19 0645)   PRN Meds: acetaminophen **OR** acetaminophen, ondansetron **OR** ondansetron (ZOFRAN) IV  Allergies:    Allergies  Allergen Reactions   Advil [Ibuprofen] Swelling   Heparin Other (See Comments)    Confusion, hallucinations.    Propoxyphene N-Acetaminophen Other (See Comments)    Numbness all over. "floating" sensation.    Social History:   Social History   Socioeconomic History   Marital status: Widowed    Spouse name: Not on file   Number of children: 4   Years of education: 3   Highest education level: 3rd grade  Occupational History   Occupation: retired  Scientist, product/process development strain: Not very hard   Food insecurity    Worry: Never true    Inability: Never true   Transportation needs    Medical: No    Non-medical: No  Tobacco Use   Smoking status: Never  Smoker   Smokeless tobacco: Never Used  Substance and Sexual Activity   Alcohol use: No   Drug use: No   Sexual activity: Not Currently  Lifestyle   Physical activity    Days  per week: 0 days    Minutes per session: 0 min   Stress: To some extent  Relationships   Social connections    Talks on phone: More than three times a week    Gets together: More than three times a week    Attends religious service: More than 4 times per year    Active member of club or organization: No    Attends meetings of clubs or organizations: Never    Relationship status: Widowed   Intimate partner violence    Fear of current or ex partner: No    Emotionally abused: No    Physically abused: No    Forced sexual activity: No  Other Topics Concern   Not on file  Social History Narrative   Not on file     Family History:    Family History  Problem Relation Age of Onset   Cancer Brother        porstate   Early death Sister    Heart disease Brother    Heart disease Brother    Heart attack Brother    Heart disease Brother    Heart attack Brother    Diabetes Sister    Heart attack Sister    Diabetes Sister    Osteoporosis Sister    Hypertension Sister    Heart attack Son    Early death Son    Pancreatitis Son    Heart attack Daughter 74      Review of Systems    General:  No chills, fever, night sweats or weight changes.  Cardiovascular:  No chest pain, dyspnea on exertion, orthopnea, palpitations, paroxysmal nocturnal dyspnea. Positive for edema.  Dermatological: No rash, lesions/masses Respiratory: No cough, dyspnea Urologic: No hematuria, dysuria Abdominal:   No nausea, vomiting, diarrhea, bright red blood per rectum, melena, or hematemesis Neurologic:  No visual changes. Positive for changes in mental status.  All other systems reviewed and are otherwise negative except as noted above.  Physical Exam/Data    Vitals:   10/04/19 0515 10/04/19 0530  10/04/19 0600 10/04/19 0729  BP: 119/83 137/86 (!) 116/93   Pulse: (!) 133 (!) 52 (!) 117 (!) 141  Resp: (!) 24 (!) 27 (!) 27 (!) 25  Temp:    (!) 97.5 F (36.4 C)  TempSrc:    Oral  SpO2: 96% 96% 95% 96%  Weight:      Height:        Intake/Output Summary (Last 24 hours) at 10/04/2019 0902 Last data filed at 10/04/2019 0500 Gross per 24 hour  Intake 751.33 ml  Output 150 ml  Net 601.33 ml   Filed Weights   10/03/19 1050 10/03/19 2041 10/04/19 0500  Weight: 66.2 kg 68.9 kg 68.9 kg   Body mass index is 24.52 kg/m.   General: Pleasant elderly female appearing in NAD Psych: Normal affect. Neuro: Alert and oriented X 3. Moves all extremities spontaneously. HEENT: Normal  Neck: Supple without bruits or JVD. Lungs:  Resp regular and unlabored, rhonchi throughout with mild rales along bases bilaterally. Heart: Irregularly irregular. No s3, s4. 2/6 SEM along RUSB.  Abdomen: Soft, non-tender, non-distended, BS + x 4.  Extremities: No clubbing or cyanosis. Trace lower extremity edema bilaterally. DP/PT/Radials 2+ and equal bilaterally.   EKG:  The EKG was personally reviewed and demonstrates:  Atrial fibrillation with RVR, HR 117 with known RBBB.  Telemetry:  Telemetry was personally reviewed and demonstrates: As above.    Labs/Studies  Relevant CV Studies:  Echocardiogram: 06/29/2019 IMPRESSIONS    1. The left ventricle has normal systolic function with an ejection fraction of 60-65%. The cavity size was normal. There is moderate concentric left ventricular hypertrophy. Left ventricular diastolic Doppler parameters are consistent with  pseudonormalization. Elevated mean left atrial pressure.  2. The right ventricle has normal systolic function. The cavity was normal. There is no increase in right ventricular wall thickness. Right ventricular systolic pressure is mildly elevated with an estimated pressure of 36.6 mmHg.  3. Left atrial size was severely dilated.  4. The  mitral valve is degenerative. Mild thickening of the mitral valve leaflet. Mild calcification of the mitral valve leaflet. There is severe mitral annular calcification present. Mitral valve regurgitation is mild to moderate by color flow Doppler.  Moderate mitral valve stenosis.  5. The aortic valve is tricuspid. Moderate thickening of the aortic valve. Moderate calcification of the aortic valve. Moderate stenosis of the aortic valve.  6. The aorta is normal in size and structure.  7. The inferior vena cava was dilated in size with >50% respiratory variability.  8. When compared to the prior study: 10/08/2018, there is very slight worsening of the aortic stenosis. Mitral valve gradients appear lower due to bradycardia.   NST: 06/2019  There was no ST segment deviation noted during stress.  Defect 1: There is a small defect of mild severity.  This is a low risk study.  Nuclear stress EF: 57%.   No significant reversible ischemia. LVEF 57% with normal wall motion. This is a low risk study.   Laboratory Data:  Chemistry Recent Labs  Lab 09/27/19 1052 10/03/19 1159 10/04/19 0431  NA 147* 136 141  K 4.8 4.8 3.6  CL 105 97* 104  CO2 24 27 28   GLUCOSE 62* 483* 125*  BUN 48* 42* 36*  CREATININE 1.71* 1.65* 1.26*  CALCIUM 9.4 9.5 9.2  GFRNONAA 27* 28* 39*  GFRAA 31* 32* 45*  ANIONGAP  --  12 9    Recent Labs  Lab 09/27/19 1052 10/03/19 1159  PROT 5.8* 6.5  ALBUMIN 3.2* 3.5  AST 20 15  ALT 12 18  ALKPHOS 51 52  BILITOT 0.3 1.1   Hematology Recent Labs  Lab 09/27/19 1052 10/03/19 1159 10/04/19 0431  WBC 4.8 6.2 5.8  RBC 3.80 4.19 3.81*  HGB 10.6* 11.5* 10.5*  HCT 34.1 38.9 35.0*  MCV 90 92.8 91.9  MCH 27.9 27.4 27.6  MCHC 31.1* 29.6* 30.0  RDW 13.5 15.7* 15.7*  PLT 149* 122* 111*   Cardiac EnzymesNo results for input(s): TROPONINI in the last 168 hours. No results for input(s): TROPIPOC in the last 168 hours.  BNP Recent Labs  Lab 09/27/19 1052  BNP  1,304.5*    DDimer No results for input(s): DDIMER in the last 168 hours.  Radiology/Studies:  Ct Head Wo Contrast  Result Date: 10/03/2019 CLINICAL DATA:  Slurred speech and confusion. EXAM: CT HEAD WITHOUT CONTRAST TECHNIQUE: Contiguous axial images were obtained from the base of the skull through the vertex without intravenous contrast. COMPARISON:  09/05/2019 FINDINGS: Brain: No discernible change since then. Generalized brain atrophy. Old cerebellar infarctions on the right. Old right occipital infarction. Extensive chronic small-vessel ischemic changes throughout the brain. No sign of acute or subacute infarction, mass lesion, hemorrhage, hydrocephalus or extra-axial collection. Vascular: There is atherosclerotic calcification of the major vessels at the base of the brain. Skull: Negative Sinuses/Orbits: Mucosal inflammatory changes of the maxillary sinuses. Previous functional endoscopic sinus  surgery. Orbits negative. Other: None IMPRESSION: No acute finding or change since the study of 09/05/2019. Atrophy and extensive chronic small-vessel ischemic changes. 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 Electronically Signed   By: Nelson Chimes M.D.   On: 10/03/2019 19:06   Dg Chest Port 1 View  Result Date: 10/03/2019 CLINICAL DATA:  Altered mental status for 2 days EXAM: PORTABLE CHEST 1 VIEW COMPARISON:  September 27, 2019 FINDINGS: The mediastinal contour and cardiac silhouette are stable. Cardiac pacemaker is unchanged. There is pulmonary edema. Patchy consolidation with right pleural effusion is identified in the right lung base. The bony structures are stable. IMPRESSION: Mild pulmonary edema. Right pleural effusion. Patchy consolidation of right lung base, suspicious for pneumonia. Electronically Signed   By: Abelardo Diesel M.D.   On: 10/03/2019 16:47     Assessment & Plan    1. Atrial Fibrillation with RVR - initially diagnosed in 16/1096 and complicated by tachy-brady  syndrome with her requiring PPM placement that admission. Presents with AMS and found to have a UTI and meet SIRS criteria.  - rates remain elevated in the 110's to 130's but the patient is asymptomatic. She just received her Toprol-XL 100mg  and is on IV Cardizem at 15mg /hr. Suspect her elevated rates are triggered by her acute illness. Would follow rates following recent dose of Toprol-XL and can hopefully transition to short-acting PO Cardizem this afternoon or tomorrow. Would transition to Cardizem CD prior to discharge for improved compliance. Not a candidate for DCCV due to compliance issues with Eliquis.  - remains on Eliquis 2.5mg  BID given age greater than 27 and baseline creatinine typically greater than 1.5 (at 1.26 this AM so will need to follow closely).  2. Tachy-brady Syndrome - s/p Medtronic PPM placement in 07/2019. Interrogation in 08/2019 showed episodes of AT/AF with rates into the 130's and intermittent compliance with Lopressor was reported. Plan for dose adjustment of AV nodal blocking agents as outlined above.  - followed by Dr. Lovena Le as an outpatient.   3. CAD - s/p CABG in 2006. NST in 06/2019 was low-risk showed no significant reversible ischemia. She denies any recent chest pain. Continue BB and statin therapy. Not on ASA given the need for anticoagulation.  4. Chronic Diastolic CHF - CXR on admission showed  mild pulmonary edema and right pleural effusion. Received 1L fluid bolus while in the ED due to SIRS criteria and elevated lactic acid. Agree with reducing IVF dosing and hopefully stopping today. Plan to restart PTA Lasix prior to discharge.   5. Aortic Stenosis - moderate stenosis by echo in 06/2019. Continue to follow as an outpatient.   6. Stage 3 CKD - peaked at 2.12 last admission, improved to 1.71 at discharge which is close to her baseline. Stable at 1.65 on admission.   7. UTI - UA concerning for UTI and culture pending. She has been started on Rocephin  by the admitting team.     For questions or updates, please contact [place] Please consult [email or web address] for contact info under Cardiology/STEMI.  Signed, Erma Heritage, PA-C 10/04/2019, 9:02 AM Pager: [phone]  The patient was seen and examined, and I agree with the history, physical exam, assessment and plan as documented above, with modifications as noted below. I have also personally reviewed all relevant documentation, old records, labs, and both radiographic and cardiovascular studies. I have also independently interpreted old and new ECG's.  The patient is an 85 year old woman whom I last saw while she  was hospitalized in mid August for new onset rapid atrial fibrillation and acute on chronic diastolic heart failure.  She had a UTI at that time as well.  She subsequently underwent pacemaker placement.  She was again hospitalized earlier this month for acute on chronic diastolic heart failure.  She presented to the hospital yesterday for altered mental status and was found to have urosepsis.  She was also found to be in rapid atrial fibrillation and started on IV Cardizem.  She is pleasantly confused but answers questions appropriately for the most part.  She is still tachycardic but just received Toprol-XL 100 mg daily.  She remains on IV Cardizem 15 mg/h.  Heart rates are elevated due to urosepsis.  She is noncompliant with medical therapy and is not a candidate for cardioversion.  We will plan to eventually transition to short acting diltiazem and eventually long-acting diltiazem prior to discharge.  I would also recommend stopping IV fluids later today as she has mild pulmonary edema and a right pleural effusion on chest x-ray.   Kate Sable, MD, [facility]  10/04/2019 9:35 AM

## 2019-10-04 NOTE — Consult Note (Signed)
Consultation Note Date: 10/04/2019   Patient Name: [name]  DOB: [birth year]  MRN: [id number]  Age / Sex: 85 y.o., female  PCP: [name], MD Referring Physician: Murlean Iba, MD  Reason for Consultation: Establishing goals of care  HPI/Patient Profile: 85 y.o. female  with past medical history of CHF, HTN, DM, atrial fib lung mass on CT (pt to followup with pulmonologist but has not yet) admitted on 10/03/2019 with confusion. Workup revealed urosepsis. Palliative medicine consulted for [place].   Clinical Assessment and Goals of Care: Met with patient and [name] at bedside.   I introduced Palliative Medicine as specialized medical care for people living with serious illness. It focuses on providing relief from the symptoms and stress of a serious illness. The goal is to improve quality of life for both the patient and the family.  We discussed a brief life review of the patient.   As far as functional and nutritional status - prior to admission- she is able to ambulate in her home with a walker. She is eating well- but they do note she has begun to lose weight. She tells me that she is often working to breath and this makes it hard for her to eat.   We discussed her current illness and what it means in the larger context of her on-going co-morbidities.  Natural disease trajectory and expectations at EOL were discussed. Patient states she wold prefer to spend her 'dying days' at home if possible. But she does not feel she in her dying days.   I attempted to elicit values and goals of care important to the patient.  Being with her dog and her family are most important.  The difference between aggressive medical intervention and comfort care was considered in light of the patient's goals of care.   Advanced directives, concepts specific to code status, artifical feeding and  hydration, and rehospitalization were considered and discussed. Patient elects DNR status, and otherwise continued aggressive medical care.   Hospice and Palliative Care services outpatient were explained and offered. They are receptive to community Palliative visits at home.   Questions and concerns were addressed.  Hard Choices booklet left for review. The family was encouraged to call with questions or concerns.  Primary Decision Maker PATIENT    SUMMARY OF RECOMMENDATIONS -DNR -Family requests consultation from Pulmonology re: lung mass on CT scan while patient is inpatient if possible -GOC- stabilize and return home -Care manager referral for Palliative at discharge -Daughter notes patient may get anxious at night without her there- will order .27m lorazepam q6hr for anxiety or agitation    Code Status/Advance Care Planning:  DNR  Prognosis:    Unable to determine  Discharge Planning: Home with Palliative Services  Primary Diagnoses: Present on Admission: . Altered mental status . Atrial fibrillation with RVR ([place]   I have reviewed the medical record, interviewed the patient and family, and examined the patient. The following aspects are pertinent.  Past Medical History:  Diagnosis Date  .  Anemia   . Anxiety   . Aortic stenosis   . Arthritis    back, knees, and hips  . Asthma    with allergies  . Atrial fibrillation ([place])   . Balance problems   . CAD (coronary artery disease)   . Carotid stenosis   . CHF (congestive heart failure) ([place])    EF preserved Echo 2012  . CKD (chronic kidney disease)   . 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 Dementia ([place])    "a little"  . Depression   . Diabetes mellitus    type 2  . GERD (gastroesophageal reflux disease)   . Hypertension   . Iron deficiency anemia 05/27/2011  . Myocardial infarction ([place])   . Renal insufficiency   .  Scoliosis   . Vertigo    "when sugar gets low"  . Vision loss    left eye-"half blind"   Social History   Socioeconomic History  . Marital status: Widowed    Spouse name: Not on file  . Number of children: 4  . Years of education: 3  . Highest education level: 3rd grade  Occupational History  . Occupation: retired  Scientific laboratory technician  . Financial resource strain: Not very hard  . Food insecurity    Worry: Never true    Inability: Never true  . Transportation needs    Medical: No    Non-medical: No  Tobacco Use  . Smoking status: Never Smoker  . Smokeless tobacco: Never Used  Substance and Sexual Activity  . Alcohol use: No  . Drug use: No  . Sexual activity: Not Currently  Lifestyle  . Physical activity    Days per week: 0 days    Minutes per session: 0 min  . Stress: To some extent  Relationships  . Social connections    Talks on phone: More than three times a week    Gets together: More than three times a week    Attends religious service: More than 4 times per year    Active member of club or organization: No    Attends meetings of clubs or organizations: Never    Relationship status: Widowed  Other Topics Concern  . Not on file  Social History Narrative  . Not on file   Family History  Problem Relation Age of Onset  . Cancer Brother        porstate  . Early death Sister   . Heart disease Brother   . Heart disease Brother   . Heart attack Brother   . Heart disease Brother   . Heart attack Brother   . Diabetes Sister   . Heart attack Sister   . Diabetes Sister   . Osteoporosis Sister   . Hypertension Sister   . Heart attack Son   . Early death Son   . Pancreatitis Son   . Heart attack Daughter 55   Scheduled Meds: . apixaban  2.5 mg Oral BID  . atorvastatin  20 mg Oral QHS  . Chlorhexidine Gluconate Cloth  6 each Topical Daily  . furosemide  40 mg Oral BID  . insulin aspart  0-9 Units Subcutaneous TID WC  . insulin aspart  2 Units Subcutaneous  TID WC  . insulin detemir  16 Units Subcutaneous Q2200  . metoprolol succinate  100 mg Oral Daily  . pantoprazole  40 mg Oral Daily  .  PARoxetine  20 mg Oral q morning - 10a  . polyethylene glycol  17 g Oral Daily  . ramipril  5 mg Oral Daily   Continuous Infusions: . cefTRIAXone (ROCEPHIN)  IV 1 g (10/04/19 1129)  . diltiazem (CARDIZEM) infusion 15 mg/hr (10/04/19 1300)   PRN Meds:.acetaminophen **OR** acetaminophen, LORazepam, ondansetron **OR** ondansetron (ZOFRAN) IV Medications Prior to Admission:  Prior to Admission medications   Medication Sig Start Date End Date Taking? Authorizing Provider  albuterol (PROVENTIL HFA;VENTOLIN HFA) 108 (90 Base) MCG/ACT inhaler Inhale 2 puffs into the lungs every 6 (six) hours as needed for wheezing or shortness of breath. 01/20/19  Yes Stacks, Cletus Gash, MD  apixaban (ELIQUIS) 2.5 MG TABS tablet Take 1 tablet (2.5 mg total) by mouth 2 (two) times daily. 08/23/19  Yes Minus Breeding, MD  atorvastatin (LIPITOR) 20 MG tablet Take 1 tablet (20 mg total) by mouth at bedtime. 10/18/18  Yes Stacks, Cletus Gash, MD  Carboxymethylcellul-Glycerin (CLEAR EYES FOR DRY EYES) 1-0.25 % SOLN Place 1 drop into both eyes daily as needed (irritation).    Yes [provider]  clotrimazole-betamethasone (LOTRISONE) cream Apply 1 application topically 2 (two) times daily. 09/18/19  Yes Rakes, Connye Burkitt, FNP  diltiazem (CARDIZEM) 30 MG tablet Take 1 tablet (30 mg total) by mouth every 8 (eight) hours. 09/26/19  Yes Barton Dubois, MD  docusate sodium (COLACE) 100 MG capsule Take 1 capsule (100 mg total) by mouth 2 (two) times daily. 09/26/19  Yes Barton Dubois, MD  esomeprazole (NEXIUM) 40 MG capsule TAKE 1 CAPSULE BY MOUTH ONCE DAILY. 10/02/19  Yes [name], MD  ferrous sulfate 324 (65 Fe) MG TBEC Take 324 mg by mouth every morning.    Yes [provider]  furosemide (LASIX) 40 MG tablet Take 2 tablets (80 mg total) by mouth daily. Patient taking  differently: Take 40 mg by mouth 2 (two) times daily.  09/20/19  Yes Minus Breeding, MD  Insulin Detemir (LEVEMIR FLEXTOUCH) 100 UNIT/ML Pen INJECT 24 UNITS EACH MORNING 09/27/19  Yes Stacks, Cletus Gash, MD  insulin lispro (HUMALOG) 100 UNIT/ML injection Check glucose before each meal (TID). If over 200 take 5 units. If over 300 take 8 units. If over 400 take 10 units. 09/27/19  Yes [name], MD  magnesium hydroxide (MILK OF MAGNESIA) 400 MG/5ML suspension Take 30 mLs by mouth daily as needed for moderate constipation. 09/26/19  Yes Barton Dubois, MD  metoprolol succinate (TOPROL-XL) 100 MG 24 hr tablet Take 1 tablet (100 mg total) by mouth daily. For blood pressure control 09/15/19  Yes Stacks, Cletus Gash, MD  Multiple Vitamins-Iron (DAILY VITAMINS/IRON/BETA CAROT PO) Take 1 tablet by mouth at bedtime.   Yes [provider]  nitroGLYCERIN (NITROSTAT) 0.4 MG SL tablet PLACE 1 TAB UNDER TONGUE EVERY 3 MINUTES AS DIRECTED UP TO 3 TIMES ASNEEDED FOR CHEST PAIN. 10/02/19  Yes Stacks, Cletus Gash, MD  PARoxetine (PAXIL) 20 MG tablet TAKE 1 TABLET BY MOUTH EVERY MORNING. 10/02/19  Yes Stacks, Cletus Gash, MD  polyethylene glycol (MIRALAX) 17 g packet Take 17 g by mouth 2 (two) times daily. 09/26/19  Yes Barton Dubois, MD  ramipril (ALTACE) 5 MG capsule Take 1 capsule (5 mg total) by mouth daily. 09/06/19  Yes [name], MD   Allergies  Allergen Reactions  . Advil [Ibuprofen] Swelling  . Heparin Other (See Comments)    Confusion, hallucinations.   . Propoxyphene N-Acetaminophen Other (See Comments)    Numbness all over. "floating" sensation.   Review of Systems  Physical  Exam  Vital Signs: BP 129/89   Pulse (!) 128   Temp (!) 97.5 F (36.4 C) (Oral)   Resp 17   Ht 5' 6"  (1.676 m)   Wt 68.9 kg   SpO2 98%   BMI 24.52 kg/m  Pain Scale: PAINAD   Pain Score: Asleep   SpO2: SpO2: 98 % O2 Device:SpO2: 98 % O2 Flow Rate: .   IO: Intake/output summary:   Intake/Output Summary (Last 24  hours) at 10/04/2019 1508 Last data filed at 10/04/2019 0500 Gross per 24 hour  Intake 651.33 ml  Output 150 ml  Net 501.33 ml    LBM:   Baseline Weight: Weight: 66.2 kg Most recent weight: Weight: 68.9 kg     Palliative Assessment/Data:     Thank you for this consult. Palliative medicine will continue to follow and assist as needed.   Time In: 1230 Time Out: 1345 Time Total: 75 minutes Greater than 50%  of this time was spent counseling and coordinating care related to the above assessment and plan.  Signed by: Mariana Kaufman, AGNP-C Palliative Medicine    Please contact Palliative Medicine Team phone at [phone] for questions and concerns.  For individual provider: See Shea Evans

## 2019-10-05 DIAGNOSIS — K649 Unspecified hemorrhoids: Secondary | ICD-10-CM | POA: Diagnosis not present

## 2019-10-05 DIAGNOSIS — Z515 Encounter for palliative care: Secondary | ICD-10-CM

## 2019-10-05 DIAGNOSIS — K5901 Slow transit constipation: Secondary | ICD-10-CM | POA: Diagnosis not present

## 2019-10-05 DIAGNOSIS — G9341 Metabolic encephalopathy: Secondary | ICD-10-CM | POA: Diagnosis not present

## 2019-10-05 DIAGNOSIS — Z950 Presence of cardiac pacemaker: Secondary | ICD-10-CM | POA: Diagnosis not present

## 2019-10-05 DIAGNOSIS — A419 Sepsis, unspecified organism: Secondary | ICD-10-CM | POA: Diagnosis not present

## 2019-10-05 DIAGNOSIS — R4182 Altered mental status, unspecified: Secondary | ICD-10-CM | POA: Diagnosis not present

## 2019-10-05 DIAGNOSIS — I4891 Unspecified atrial fibrillation: Secondary | ICD-10-CM | POA: Diagnosis not present

## 2019-10-05 DIAGNOSIS — Z7189 Other specified counseling: Secondary | ICD-10-CM | POA: Diagnosis not present

## 2019-10-05 LAB — GLUCOSE, CAPILLARY
Glucose-Capillary: 131 mg/dL — ABNORMAL HIGH (ref 70–99)
Glucose-Capillary: 142 mg/dL — ABNORMAL HIGH (ref 70–99)
Glucose-Capillary: 292 mg/dL — ABNORMAL HIGH (ref 70–99)
Glucose-Capillary: 287 mg/dL — ABNORMAL HIGH (ref 70–99)
Glucose-Capillary: 272 mg/dL — ABNORMAL HIGH (ref 70–99)

## 2019-10-05 LAB — BASIC METABOLIC PANEL
Anion gap: 11 (ref 5–15)
BUN: 37 mg/dL — ABNORMAL HIGH (ref 8–23)
CO2: 26 mmol/L (ref 22–32)
Calcium: 9.2 mg/dL (ref 8.9–10.3)
Chloride: 102 mmol/L (ref 98–111)
Creatinine, Ser: 1.54 mg/dL — ABNORMAL HIGH (ref 0.44–1.00)
GFR calc Af Amer: 35 mL/min — ABNORMAL LOW (ref >60)
GFR calc non Af Amer: 30 mL/min — ABNORMAL LOW (ref >60)
Glucose, Bld: 301 mg/dL — ABNORMAL HIGH (ref 70–99)
Potassium: 4.4 mmol/L (ref 3.5–5.1)
Sodium: 139 mmol/L (ref 135–145)

## 2019-10-05 LAB — URINE CULTURE: Culture: >=100000 — AB

## 2019-10-05 MED ORDER — INSULIN ASPART 100 UNIT/ML ~~LOC~~ SOLN
4.0000 [IU] | Freq: Three times a day (TID) | SUBCUTANEOUS | Status: DC
  Administered 2019-10-05: 18:00:00 4 [IU] via SUBCUTANEOUS

## 2019-10-05 MED ORDER — SENNA 8.6 MG PO TABS
1.0000 | ORAL_TABLET | Freq: Two times a day (BID) | ORAL | Status: DC
  Administered 2019-10-05 – 2019-10-06 (×4): 8.6 mg via ORAL
  Filled 2019-10-05 (×5): qty 1

## 2019-10-05 MED ORDER — TRAMADOL HCL 50 MG PO TABS
50.0000 mg | ORAL_TABLET | Freq: Two times a day (BID) | ORAL | Status: DC | PRN
  Administered 2019-10-05 – 2019-10-07 (×3): 50 mg via ORAL
  Filled 2019-10-05 (×4): qty 1

## 2019-10-05 MED ORDER — POLYETHYLENE GLYCOL 3350 17 G PO PACK: 17.0000 g | PACK | Freq: Once | ORAL | Status: DC

## 2019-10-05 MED ORDER — HYDROCORTISONE ACETATE 25 MG RE SUPP
25.0000 mg | Freq: Two times a day (BID) | RECTAL | Status: DC
  Administered 2019-10-05 – 2019-10-06 (×4): 25 mg via RECTAL
  Filled 2019-10-05 (×4): qty 1

## 2019-10-05 MED ORDER — DILTIAZEM HCL 30 MG PO TABS
30.0000 mg | ORAL_TABLET | Freq: Four times a day (QID) | ORAL | Status: DC
  Administered 2019-10-05 – 2019-10-06 (×4): 30 mg via ORAL
  Filled 2019-10-05 (×4): qty 1

## 2019-10-05 NOTE — Evaluation (Signed)
Physical Therapy Evaluation Patient Details Name: [name] MRN: [id number] DOB: 1931/12/04 Today's Date: 10/05/2019   History of Present Illness  [name] is a 85 y.o. female with medical history significant for diabetes mellitus, hypertension, depression, diastolic CHF, atrial fibrillation, coronary artery disease, CVA.  History is limited as patient has baseline slurred speech, and acute confusion.  She answers a few questions appropriately on review of systems, unable to give me details of the events of today, and keeps calling her daughter's name Margaretha Sheffield.Most of the history is obtained from chart review and from daughter- [name] on the phone.  Daughter reports patient has been confused for the past 2 days, like patient standing in front of the bathroom and not knowing what the pattern is.  No fevers.  Patient denies difficulty breathing or cough.  She denies vomiting no abdominal pain. No COVID contacts.  Daughter reports chronic lower extremity swelling that has somewhat improved.  No vomiting, no loose stools.  She has been compliant with Lasix and home insulins.  She reports good oral intake.    Clinical Impression  Patient presents with generalized weakness and decreased tolerance to functional activity due to fatigue. Patient required Mod A for supine to sit, and for sit to stand, with frequent cueing for hand placement and appropriate weight shifting to assist with transfer. Patient was able to take about 5-6 small steps from bed to chair using RW, and CG, but requires cueing for hand placement and posturing while ambulating. Patient became fatigued very quickly and was placed in chair seat upright. Patient left in chair with daughter present. Patient and daughter briefed on therapy POC and were agreeable. Patient will benefit from continued physical therapy in hospital and recommended venue below to increase strength, balance, endurance for safe ADLs and gait.      Follow Up  Recommendations SNF;Supervision for mobility/OOB;Supervision/Assistance - 24 hour    Equipment Recommendations  Other (comment)(rails for hospital bed to assist with bed mobility)    Recommendations for Other Services       Precautions / Restrictions Precautions Precautions: Fall Restrictions Weight Bearing Restrictions: No      Mobility  Bed Mobility Overal bed mobility: Needs Assistance Bed Mobility: Supine to Sit     Supine to sit: Mod assist     General bed mobility comments: Slow, labored movement with verbal cueing needed for weight shifting and hand placement  Transfers Overall transfer level: Needs assistance Equipment used: Rolling walker (2 wheeled);1 person hand held assist Transfers: Sit to/from Stand Sit to Stand: Mod assist         General transfer comment: Slow labored movement, with frequent cueing needed for hand placement and sequencing  Ambulation/Gait Ambulation/Gait assistance: Mod assist Gait Distance (Feet): 4 Feet(from bed to chair) Assistive device: 1 person hand held assist;Rolling walker (2 wheeled) Gait Pattern/deviations: Decreased step length - left;Decreased stance time - right;Shuffle;Decreased stride length;Trunk flexed Gait velocity: Decreased   General Gait Details: Slow labored cadence, with forward flexed trunk, possibly due to hx of scoliosis. Patient stated "I can't stand any taller"  Stairs            Wheelchair Mobility    Modified Rankin (Stroke Patients Only)       Balance Overall balance assessment: Needs assistance Sitting-balance support: Bilateral upper extremity supported;Feet supported Sitting balance-Leahy Scale: Poor     Standing balance support: Bilateral upper extremity supported Standing balance-Leahy Scale: Poor Standing balance comment: requires mod assit and RW to maintain  standing                             Pertinent Vitals/Pain Pain Assessment: No/denies pain    Home  Living Family/patient expects to be discharged to:: Private residence Living Arrangements: Children Available Help at Discharge: Family Type of Home: House Home Access: Stairs to enter   Technical brewer of Steps: 1 Home Layout: One level Home Equipment: Tub bench;Bedside commode;Hospital bed;Wheelchair - manual;Toilet riser Additional Comments: Confirmed by daughter who was present at evaluation    Prior Function Level of Independence: Needs assistance   Gait / Transfers Assistance Needed: limited household ambulator with walker with assist  ADL's / Homemaking Assistance Needed: Lives with daughter who ias able to assist        Hand Dominance        Extremity/Trunk Assessment   Upper Extremity Assessment Upper Extremity Assessment: Generalized weakness    Lower Extremity Assessment Lower Extremity Assessment: Generalized weakness    Cervical / Trunk Assessment Cervical / Trunk Assessment: Kyphotic  Communication   Communication: Expressive difficulties  Cognition Arousal/Alertness: Awake/alert Behavior During Therapy: WFL for tasks assessed/performed Overall Cognitive Status: Within Functional Limits for tasks assessed                                 General Comments: Patient WFL for cognition with the exception of occasional difficulty following 2 step commands, such as hand placement or sequencing for sit to stand transfer      General Comments      Exercises     Assessment/Plan    PT Assessment Patient needs continued PT services  PT Problem List Decreased strength;Decreased activity tolerance;Decreased balance;Decreased mobility       PT Treatment Interventions DME instruction;Gait training;Functional mobility training;Patient/family education;Therapeutic activities;Therapeutic exercise    PT Goals (Current goals can be found in the Care Plan section)  Acute Rehab PT Goals Patient Stated Goal: Go home with daughter PT Goal  Formulation: With patient/family Time For Goal Achievement: 10/19/19 Potential to Achieve Goals: Good    Frequency Min 3X/week   Barriers to discharge        Co-evaluation               AM-PAC PT "6 Clicks" Mobility  Outcome Measure Help needed turning from your back to your side while in a flat bed without using bedrails?: A Lot Help needed moving from lying on your back to sitting on the side of a flat bed without using bedrails?: A Lot Help needed moving to and from a bed to a chair (including a wheelchair)?: A Lot Help needed standing up from a chair using your arms (e.g., wheelchair or bedside chair)?: A Lot Help needed to walk in hospital room?: A Lot Help needed climbing 3-5 steps with a railing? : Total 6 Click Score: 11    End of Session Equipment Utilized During Treatment: Gait belt Activity Tolerance: Patient limited by fatigue;Patient tolerated treatment well Patient left: in chair;with call bell/phone within reach;with chair alarm set;with family/visitor present Nurse Communication: Mobility status PT Visit Diagnosis: Unsteadiness on feet (R26.81);Other abnormalities of gait and mobility (R26.89);Muscle weakness (generalized) (M62.81);Difficulty in walking, not elsewhere classified (R26.2)    Time: 1530-1605 PT Time Calculation (min) (ACUTE ONLY): 35 min   Charges:   PT Evaluation $PT Eval Moderate Complexity: 1 Mod PT Treatments $Therapeutic Activity: 8-22 mins  4:36 PM, 10/05/19 Josue Hector PT DPT  Physical Therapist with [facility]  [phone]

## 2019-10-05 NOTE — Progress Notes (Signed)
Progress Note  Patient Name: [name] Date of Encounter: 10/05/2019  Primary Cardiologist: Minus Breeding, MD   Subjective   No complaints. Eating breakfast.  Inpatient Medications    Scheduled Meds: . apixaban  2.5 mg Oral BID  . atorvastatin  20 mg Oral QHS  . Chlorhexidine Gluconate Cloth  6 each Topical Daily  . furosemide  40 mg Oral BID  . insulin aspart  0-9 Units Subcutaneous TID WC  . insulin aspart  2 Units Subcutaneous TID WC  . insulin detemir  16 Units Subcutaneous Q2200  . metoprolol succinate  100 mg Oral Daily  . pantoprazole  40 mg Oral Daily  . PARoxetine  20 mg Oral q morning - 10a  . polyethylene glycol  17 g Oral Daily  . ramipril  5 mg Oral Daily   Continuous Infusions: . cefTRIAXone (ROCEPHIN)  IV Stopped (10/04/19 1159)  . diltiazem (CARDIZEM) infusion 5 mg/hr (10/05/19 0557)   PRN Meds: acetaminophen **OR** acetaminophen, LORazepam, ondansetron **OR** ondansetron (ZOFRAN) IV   Vital Signs    Vitals:   10/05/19 0500 10/05/19 0530 10/05/19 0600 10/05/19 0801  BP: 121/89 (!) 128/109 (!) 149/129   Pulse: (!) 50 (!) 119 98   Resp: (!) 38 (!) 37 (!) 34   Temp:    97.7 F (36.5 C)  TempSrc:    Oral  SpO2: 94% 93% 94%   Weight:      Height:        Intake/Output Summary (Last 24 hours) at 10/05/2019 0839 Last data filed at 10/05/2019 0500 Gross per 24 hour  Intake 463.73 ml  Output 200 ml  Net 263.73 ml   Filed Weights   10/03/19 1050 10/03/19 2041 10/04/19 0500  Weight: 66.2 kg 68.9 kg 68.9 kg    Telemetry    Atrial fibrillation, mostly 90-low 100 bpm range, occasionally up to 110-115 - Personally Reviewed  Physical Exam   GEN: No acute distress.   Neck: No JVD Cardiac: Irregular, 3/6 ESM at RUSB Respiratory: Faint rales bilaterally. GI: Soft, nontender, non-distended  MS: No edema; No deformity. Neuro:  Nonfocal  Psych: Normal affect   Labs    Chemistry Recent Labs  Lab 10/03/19 1159 10/04/19 0431  NA 136  141  K 4.8 3.6  CL 97* 104  CO2 27 28  GLUCOSE 483* 125*  BUN 42* 36*  CREATININE 1.65* 1.26*  CALCIUM 9.5 9.2  PROT 6.5  --   ALBUMIN 3.5  --   AST 15  --   ALT 18  --   ALKPHOS 52  --   BILITOT 1.1  --   GFRNONAA 28* 39*  GFRAA 32* 45*  ANIONGAP 12 9     Hematology Recent Labs  Lab 10/03/19 1159 10/04/19 0431  WBC 6.2 5.8  RBC 4.19 3.81*  HGB 11.5* 10.5*  HCT 38.9 35.0*  MCV 92.8 91.9  MCH 27.4 27.6  MCHC 29.6* 30.0  RDW 15.7* 15.7*  PLT 122* 111*    Cardiac EnzymesNo results for input(s): TROPONINI in the last 168 hours. No results for input(s): TROPIPOC in the last 168 hours.   BNPNo results for input(s): BNP, PROBNP in the last 168 hours.   DDimer No results for input(s): DDIMER in the last 168 hours.   Radiology    Ct Head Wo Contrast  Result Date: 10/03/2019 CLINICAL DATA:  Slurred speech and confusion. EXAM: CT HEAD WITHOUT CONTRAST TECHNIQUE: Contiguous axial images were obtained from the base of the  skull through the vertex without intravenous contrast. COMPARISON:  09/05/2019 FINDINGS: Brain: No discernible change since then. Generalized brain atrophy. Old cerebellar infarctions on the right. Old right occipital infarction. Extensive chronic small-vessel ischemic changes throughout the brain. No sign of acute or subacute infarction, mass lesion, hemorrhage, hydrocephalus or extra-axial collection. Vascular: There is atherosclerotic calcification of the major vessels at the base of the brain. Skull: Negative Sinuses/Orbits: Mucosal inflammatory changes of the maxillary sinuses. Previous functional endoscopic sinus surgery. Orbits negative. Other: None IMPRESSION: No acute finding or change since the study of 09/05/2019. Atrophy and extensive chronic small-vessel ischemic changes. 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 Electronically Signed   By: Nelson Chimes M.D.   On: 10/03/2019 19:06   Dg Chest Port 1 View  Result Date: 10/03/2019  CLINICAL DATA:  Altered mental status for 2 days EXAM: PORTABLE CHEST 1 VIEW COMPARISON:  September 27, 2019 FINDINGS: The mediastinal contour and cardiac silhouette are stable. Cardiac pacemaker is unchanged. There is pulmonary edema. Patchy consolidation with right pleural effusion is identified in the right lung base. The bony structures are stable. IMPRESSION: Mild pulmonary edema. Right pleural effusion. Patchy consolidation of right lung base, suspicious for pneumonia. Electronically Signed   By: Abelardo Diesel M.D.   On: 10/03/2019 16:47    Cardiac Studies   Relevant CV Studies:  Echocardiogram: 06/29/2019 IMPRESSIONS   1. The left ventricle has normal systolic function with an ejection fraction of 60-65%. The cavity size was normal. There is moderate concentric left ventricular hypertrophy. Left ventricular diastolic Doppler parameters are consistent with  pseudonormalization. Elevated mean left atrial pressure. 2. The right ventricle has normal systolic function. The cavity was normal. There is no increase in right ventricular wall thickness. Right ventricular systolic pressure is mildly elevated with an estimated pressure of 36.6 mmHg. 3. Left atrial size was severely dilated. 4. The mitral valve is degenerative. Mild thickening of the mitral valve leaflet. Mild calcification of the mitral valve leaflet. There is severe mitral annular calcification present. Mitral valve regurgitation is mild to moderate by color flow Doppler. Moderate mitral valve stenosis. 5. The aortic valve is tricuspid. Moderate thickening of the aortic valve. Moderate calcification of the aortic valve. Moderate stenosis of the aortic valve. 6. The aorta is normal in size and structure. 7. The inferior vena cava was dilated in size with >50% respiratory variability. 8. When compared to the prior study: 10/08/2018, there is very slight worsening of the aortic stenosis. Mitral valve gradients appear lower  due to bradycardia.   NST: 06/2019  There was no ST segment deviation noted during stress.  Defect 1: There is a small defect of mild severity.  This is a low risk study.  Nuclear stress EF: 57%.  No significant reversible ischemia. LVEF 57% with normal wall motion. This is a low risk study.  Patient Profile     86 y.o. female with past medical history of CAD (s/p CABG in 2006), carotid artery stenosis, aortic stenosis, persistent atrial fibrillation, tachy-brady syndrome (s/p [employer] PPM placement in 07/2019), HTN, HLD, and Type 2 DM, Stage 3 CKD, and prior CVA who is being seen today for the evaluation of atrial fibrillation with RVR at the request of Dr. Wynetta Emery.   Assessment & Plan    1. Atrial Fibrillation with RVR - initially diagnosed in 29/5621 and complicated by tachy-brady syndrome with her requiring PPM placement that admission. Presented with AMS and found to have a UTI and meet SIRS criteria.  -  rates now in 90-low 100 bpm range for the most part and the patient is asymptomatic. She is on Toprol-XL 100mg  and is on IV Cardizem at 5 mg/hr. Suspect her elevated rates are triggered by her acute illness. Will transition to short-acting PO Cardizem and ultimately to Cardizem CD prior to discharge for improved compliance. Not a candidate for DCCV due to compliance issues with Eliquis.  - remains on Eliquis 2.5mg  BID given age greater than 54 and baseline creatinine typically greater than 1.5 (at 1.26 on 10/28 so will need to follow closely). I will order a BMET.  2. Tachy-brady Syndrome - s/p Med[employer] PPM placement in 07/2019. Interrogation in 08/2019 showed episodes of AT/AF with rates into the 130's and intermittent compliance with Lopressor was reported. Plan for dose adjustment of AV nodal blocking agents as outlined above.  - followed by Dr. Lovena Le as an outpatient.   3. CAD - s/p CABG in 2006. NST in 06/2019 was low-risk showed no significant reversible ischemia.  She denies any recent chest pain. Continue BB and statin therapy. Not on ASA given the need for anticoagulation.  4. Chronic Diastolic CHF - CXR on admission showed  mild pulmonary edema and right pleural effusion. Received 1L fluid bolus while in the ED due to SIRS criteria and elevated lactic acid. Internal Medicine has resumed oral Lasix 40 mg bid.  5. Aortic Stenosis - moderate stenosis by echo in 06/2019. Continue to follow as an outpatient.   6. Stage 3 CKD - peaked at 2.12 last admission, improved to 1.71 at discharge which is close to her baseline. Stable at 1.65 on admission and 1.26 on 10/28. I will order a BMET.   7. UTI - UA concerning for UTI and culture showed >100 k GNR. She has been started on Rocephin by the admitting team.      For questions or updates, please contact [facility] Please consult [email or web address] for contact info under Cardiology/STEMI.      Signed, Kate Sable, MD  10/05/2019, 8:39 AM

## 2019-10-05 NOTE — Consult Note (Signed)
Consult requested by: [facility] hospitalist, Dr. Wynetta Emery Consult requested for: Abnormal chest CT  HPI: Is an 85 year old who has past medical history of congestive heart failure hypertension diabetes atrial fib possible lung mass.  She was admitted to the hospital earlier this month with acute on chronic diastolic heart failure exacerbation.  She was also found to have what appeared to be a mass versus fluid consolidation on CT.  A contrasted CT was recommended but her renal function did not allow that.  She came back to the emergency department on the 27th of this month with altered mental status for 2 days.  She had worsening slurred speech and had been confused.  Since she has been in the hospital she has had cardiology consultation palliative care consultation and her family requested to go ahead with pulmonary consultation since she was in the hospital.  Chest x-ray done this admission which I have personally reviewed looks more like she has pleural effusion which was identified on the CT some pulmonary edema and what seems to be some patchy consolidation in the right lung base.  She has history of asthma.  She has had a previous stroke and has some visual defect and slurred speech.  This morning her speech is very difficult to understand so most of the information is from the medical record.  She is a lifelong non-smoker.  She is not complaining of chest pain.  She is not coughing much.  She is not coughing up any sputum. Past Medical History:  Diagnosis Date  . Anemia   . Anxiety   . Aortic stenosis   . Arthritis    back, knees, and hips  . Asthma    with allergies  . Atrial fibrillation ([place])   . Balance problems   . CAD (coronary artery disease)   . Carotid stenosis   . CHF (congestive heart failure) ([place])    EF preserved Echo 2012  . CKD (chronic kidney disease)   . 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 Dementia ([place])    "a little"  . Depression   . Diabetes mellitus    type 2  . GERD (gastroesophageal reflux disease)   . Hypertension   . Iron deficiency anemia 05/27/2011  . Myocardial infarction ([place])   . Renal insufficiency   . Scoliosis   . Vertigo    "when sugar gets low"  . Vision loss    left eye-"half blind"     Family History  Problem Relation Age of Onset  . Cancer Brother        porstate  . Early death Sister   . Heart disease Brother   . Heart disease Brother   . Heart attack Brother   . Heart disease Brother   . Heart attack Brother   . Diabetes Sister   . Heart attack Sister   . Diabetes Sister   . Osteoporosis Sister   . Hypertension Sister   . Heart attack Son   . Early death Son   . Pancreatitis Son   . Heart attack Daughter 44     Social History   Socioeconomic History  . Marital status: Widowed    Spouse name: Not on file  . Number of children: 4  . Years of education: 3  . Highest education level: 3rd grade  Occupational History  . Occupation: retired  Scientific laboratory technician  . Financial resource strain:  Not very hard  . Food insecurity    Worry: Never true    Inability: Never true  . Transportation needs    Medical: No    Non-medical: No  Tobacco Use  . Smoking status: Never Smoker  . Smokeless tobacco: Never Used  Substance and Sexual Activity  . Alcohol use: No  . Drug use: No  . Sexual activity: Not Currently  Lifestyle  . Physical activity    Days per week: 0 days    Minutes per session: 0 min  . Stress: To some extent  Relationships  . Social connections    Talks on phone: More than three times a week    Gets together: More than three times a week    Attends religious service: More than 4 times per year    Active member of club or organization: No    Attends meetings of clubs or organizations: Never    Relationship status: Widowed  Other Topics Concern  . Not on file  Social History Narrative  . Not on file     ROS:  Unable to obtain except as above    Objective: Vital signs in last 24 hours: Temp:  [97.5 F (36.4 C)-98.3 F (36.8 C)] 98.3 F (36.8 C) (10/28 1636) Pulse Rate:  [46-141] 98 (10/29 0600) Resp:  [17-38] 34 (10/29 0600) BP: (100-149)/(64-129) 149/129 (10/29 0600) SpO2:  [86 %-98 %] 94 % (10/29 0600) Weight change:     Intake/Output from previous day: 10/28 0701 - 10/29 0700 In: 463.7 [I.V.:363.7; IV Piggyback:100] Out: 200 [Urine:200]  PHYSICAL EXAM Constitutional: She is awake and alert complaining of abdominal discomfort.  Eyes: Pupils react EOMI.  Ears nose mouth and throat: Her mucous membranes are moist.  Throat is clear.  Cardiovascular: She is in atrial fib with no gallop.  Respiratory: Respiratory effort is normal.  She has some bilateral rales mostly in the bases and mildly decreased breath sounds on the right.  Gastrointestinal: She is complaining of abdominal pain but she does not really have any tenderness.  Musculoskeletal: Grossly normal.  Neurological: Her speech is very slurred and difficult to understand psychiatric: Normal mood and affect  Lab Results: Basic Metabolic Panel: Recent Labs    10/03/19 1159 10/04/19 0431  NA 136 141  K 4.8 3.6  CL 97* 104  CO2 27 28  GLUCOSE 483* 125*  BUN 42* 36*  CREATININE 1.65* 1.26*  CALCIUM 9.5 9.2   Liver Function Tests: Recent Labs    10/03/19 1159  AST 15  ALT 18  ALKPHOS 52  BILITOT 1.1  PROT 6.5  ALBUMIN 3.5   No results for input(s): LIPASE, AMYLASE in the last 72 hours. No results for input(s): AMMONIA in the last 72 hours. CBC: Recent Labs    10/03/19 1159 10/04/19 0431  WBC 6.2 5.8  HGB 11.5* 10.5*  HCT 38.9 35.0*  MCV 92.8 91.9  PLT 122* 111*   Cardiac Enzymes: No results for input(s): CKTOTAL, CKMB, CKMBINDEX, TROPONINI in the last 72 hours. BNP: No results for input(s): PROBNP in the last 72 hours. D-Dimer: No results for input(s): DDIMER in the last 72 hours. CBG: Recent Labs     10/04/19 0503 10/04/19 0731 10/04/19 1126 10/04/19 1638 10/04/19 2120 10/05/19 0339  GLUCAP 105* 68* 117* 141* 77 131*   Hemoglobin A1C: No results for input(s): HGBA1C in the last 72 hours. Fasting Lipid Panel: No results for input(s): CHOL, HDL, LDLCALC, TRIG, CHOLHDL, LDLDIRECT in the last 72 hours.  Thyroid Function Tests: No results for input(s): TSH, T4TOTAL, FREET4, T3FREE, THYROIDAB in the last 72 hours. Anemia Panel: No results for input(s): VITAMINB12, FOLATE, FERRITIN, TIBC, IRON, RETICCTPCT in the last 72 hours. Coagulation: No results for input(s): LABPROT, INR in the last 72 hours. Urine Drug Screen: Drugs of Abuse  No results found for: LABOPIA, COCAINSCRNUR, LABBENZ, AMPHETMU, THCU, LABBARB  Alcohol Level: No results for input(s): ETH in the last 72 hours. Urinalysis: Recent Labs    10/03/19 1205  COLORURINE YELLOW  LABSPEC 1.011  PHURINE 5.0  GLUCOSEU >=500*  HGBUR MODERATE*  BILIRUBINUR NEGATIVE  KETONESUR NEGATIVE  PROTEINUR 100*  NITRITE NEGATIVE  LEUKOCYTESUR MODERATE*   Misc. Labs:   ABGS: No results for input(s): PHART, PO2ART, TCO2, HCO3 in the last 72 hours.  Invalid input(s): PCO2   MICROBIOLOGY: Recent Results (from the past 240 hour(s))  Urine culture     Status: Abnormal (Preliminary result)   Collection Time: 10/03/19 12:05 PM   Specimen: Urine, Catheterized  Result Value Ref Range Status   Specimen Description   Final    URINE, CATHETERIZED Performed at [facility], [address] [place], [place] [address]    Special Requests   Final    NONE Performed at [facility], [address], [place], [place] [address]    Culture (A)  Final    >=100,000 COLONIES/mL GRAM NEGATIVE RODS IDENTIFICATION AND SUSCEPTIBILITIES TO FOLLOW Performed at [facility] [address] [place], [place] [address]    Report Status PENDING  Incomplete  SARS CORONAVIRUS 2 (TAT 6-24 HRS) Nasopharyngeal Nasopharyngeal Swab     Status: None    Collection Time: 10/03/19  1:59 PM   Specimen: Nasopharyngeal Swab  Result Value Ref Range Status   SARS Coronavirus 2 NEGATIVE NEGATIVE Final    Comment: (NOTE) SARS-CoV-2 target nucleic acids are NOT DETECTED. The SARS-CoV-2 RNA is generally detectable in upper and lower respiratory specimens during the acute phase of infection. Negative results do not preclude SARS-CoV-2 infection, do not rule out co-infections with other pathogens, and should not be used as the sole basis for treatment or other patient management decisions. Negative results must be combined with clinical observations, patient history, and epidemiological information. The expected result is Negative. Fact Sheet for Patients: SugarRoll.be Fact Sheet for Healthcare Providers: https://www.woods-mathews.com/ This test is not yet approved or cleared by the [place] FDA and  has been authorized for detection and/or diagnosis of SARS-CoV-2 by FDA under an Emergency Use Authorization (EUA). This EUA will remain  in effect (meaning this test can be used) for the duration of the COVID-19 declaration under Section 56 4(b)(1) of the Act, 21 U.S.C. section 360bbb-3(b)(1), unless the authorization is terminated or revoked sooner. Performed at [facility], [place] [address] [place], [place] [address]   Culture, blood (routine x 2)     Status: None (Preliminary result)   Collection Time: 10/03/19  5:13 PM   Specimen: Right Antecubital; Blood  Result Value Ref Range Status   Specimen Description RIGHT ANTECUBITAL  Final   Special Requests   Final    BOTTLES DRAWN AEROBIC AND ANAEROBIC Blood Culture adequate volume   Culture   Final    NO GROWTH < 24 HOURS Performed at [facility] [address], [place],  [address]    Report Status PENDING  Incomplete  Culture, blood (routine x 2)     Status: None (Preliminary result)   Collection Time: 10/03/19  5:13 PM  Specimen: BLOOD RIGHT HAND  Result Value Ref Range Status   Specimen Description BLOOD RIGHT HAND  Final   Special Requests   Final    BOTTLES DRAWN AEROBIC AND ANAEROBIC Blood Culture adequate volume   Culture   Final    NO GROWTH < 24 HOURS Performed at [facility], [address], [place], [place] [address]    Report Status PENDING  Incomplete  MRSA PCR Screening     Status: None   Collection Time: 10/03/19  8:30 PM   Specimen: Nasal Mucosa; Nasopharyngeal  Result Value Ref Range Status   MRSA by PCR NEGATIVE NEGATIVE Final    Comment:        The GeneXpert MRSA Assay (FDA approved for NASAL specimens only), is one component of a comprehensive MRSA colonization surveillance program. It is not intended to diagnose MRSA infection nor to guide or monitor treatment for MRSA infections. Performed at [facility], [address] [place], [place] [address]     Studies/Results: Ct Head Wo Contrast  Result Date: 10/03/2019 CLINICAL DATA:  Slurred speech and confusion. EXAM: CT HEAD WITHOUT CONTRAST TECHNIQUE: Contiguous axial images were obtained from the base of the skull through the vertex without intravenous contrast. COMPARISON:  09/05/2019 FINDINGS: Brain: No discernible change since then. Generalized brain atrophy. Old cerebellar infarctions on the right. Old right occipital infarction. Extensive chronic small-vessel ischemic changes throughout the brain. No sign of acute or subacute infarction, mass lesion, hemorrhage, hydrocephalus or extra-axial collection. Vascular: There is atherosclerotic calcification of the major vessels at the base of the brain. Skull: Negative Sinuses/Orbits: Mucosal inflammatory changes of the maxillary sinuses. Previous functional endoscopic sinus surgery. Orbits negative. Other: None IMPRESSION: No acute finding or change since the study of 09/05/2019. Atrophy and extensive chronic small-vessel ischemic changes. Old infarctions right cerebellum and right  occipital lobe. Electronically Signed   By: Nelson Chimes M.D.   On: 10/03/2019 19:06   Dg Chest Port 1 View  Result Date: 10/03/2019 CLINICAL DATA:  Altered mental status for 2 days EXAM: PORTABLE CHEST 1 VIEW COMPARISON:  September 27, 2019 FINDINGS: The mediastinal contour and cardiac silhouette are stable. Cardiac pacemaker is unchanged. There is pulmonary edema. Patchy consolidation with right pleural effusion is identified in the right lung base. The bony structures are stable. IMPRESSION: Mild pulmonary edema. Right pleural effusion. Patchy consolidation of right lung base, suspicious for pneumonia. Electronically Signed   By: Abelardo Diesel M.D.   On: 10/03/2019 16:47    Medications:  Prior to Admission:  Medications Prior to Admission  Medication Sig Dispense Refill Last Dose  . albuterol (PROVENTIL HFA;VENTOLIN HFA) 108 (90 Base) MCG/ACT inhaler Inhale 2 puffs into the lungs every 6 (six) hours as needed for wheezing or shortness of breath. 1 Inhaler 5   . apixaban (ELIQUIS) 2.5 MG TABS tablet Take 1 tablet (2.5 mg total) by mouth 2 (two) times daily. 60 tablet 11 10/02/2019 at 2100  . atorvastatin (LIPITOR) 20 MG tablet Take 1 tablet (20 mg total) by mouth at bedtime. 90 tablet 3 10/02/2019 at Unknown time  . Carboxymethylcellul-Glycerin (CLEAR EYES FOR DRY EYES) 1-0.25 % SOLN Place 1 drop into both eyes daily as needed (irritation).      . clotrimazole-betamethasone (LOTRISONE) cream Apply 1 application topically 2 (two) times daily. 30 g 0   . diltiazem (CARDIZEM) 30 MG tablet Take 1 tablet (30 mg total) by mouth every 8 (eight) hours. 90 tablet 1 10/02/2019 at Unknown time  . docusate  sodium (COLACE) 100 MG capsule Take 1 capsule (100 mg total) by mouth 2 (two) times daily. 60 capsule 0 10/02/2019 at Unknown time  . esomeprazole (NEXIUM) 40 MG capsule TAKE 1 CAPSULE BY MOUTH ONCE DAILY. 30 capsule 0 10/02/2019 at Unknown time  . ferrous sulfate 324 (65 Fe) MG TBEC Take 324 mg by mouth  every morning.    10/02/2019 at Unknown time  . furosemide (LASIX) 40 MG tablet Take 2 tablets (80 mg total) by mouth daily. (Patient taking differently: Take 40 mg by mouth 2 (two) times daily. ) 60 tablet 6 10/02/2019 at Unknown time  . Insulin Detemir (LEVEMIR FLEXTOUCH) 100 UNIT/ML Pen INJECT 24 UNITS EACH MORNING 9 mL 2 10/02/2019 at Unknown time  . insulin lispro (HUMALOG) 100 UNIT/ML injection Check glucose before each meal (TID). If over 200 take 5 units. If over 300 take 8 units. If over 400 take 10 units. 10 mL 11 10/02/2019 at Unknown time  . magnesium hydroxide (MILK OF MAGNESIA) 400 MG/5ML suspension Take 30 mLs by mouth daily as needed for moderate constipation.     . metoprolol succinate (TOPROL-XL) 100 MG 24 hr tablet Take 1 tablet (100 mg total) by mouth daily. For blood pressure control 30 tablet 1 10/02/2019 at 900  . Multiple Vitamins-Iron (DAILY VITAMINS/IRON/BETA CAROT PO) Take 1 tablet by mouth at bedtime.   10/02/2019 at Unknown time  . nitroGLYCERIN (NITROSTAT) 0.4 MG SL tablet PLACE 1 TAB UNDER TONGUE EVERY 3 MINUTES AS DIRECTED UP TO 3 TIMES ASNEEDED FOR CHEST PAIN. 25 tablet 0   . PARoxetine (PAXIL) 20 MG tablet TAKE 1 TABLET BY MOUTH EVERY MORNING. 30 tablet 0 10/02/2019 at Unknown time  . polyethylene glycol (MIRALAX) 17 g packet Take 17 g by mouth 2 (two) times daily. 100 each 1 10/02/2019 at Unknown time  . ramipril (ALTACE) 5 MG capsule Take 1 capsule (5 mg total) by mouth daily. 90 capsule 3 10/02/2019 at Unknown time   Scheduled: . apixaban  2.5 mg Oral BID  . atorvastatin  20 mg Oral QHS  . Chlorhexidine Gluconate Cloth  6 each Topical Daily  . furosemide  40 mg Oral BID  . insulin aspart  0-9 Units Subcutaneous TID WC  . insulin aspart  2 Units Subcutaneous TID WC  . insulin detemir  16 Units Subcutaneous Q2200  . metoprolol succinate  100 mg Oral Daily  . pantoprazole  40 mg Oral Daily  . PARoxetine  20 mg Oral q morning - 10a  . polyethylene glycol  17 g  Oral Daily  . ramipril  5 mg Oral Daily   Continuous: . cefTRIAXone (ROCEPHIN)  IV Stopped (10/04/19 1159)  . diltiazem (CARDIZEM) infusion 5 mg/hr (10/05/19 0557)   LMB:[id number] **OR** acetaminophen, LORazepam, ondansetron **OR** ondansetron (ZOFRAN) IV  Assesment: She has abnormal noncontrasted chest CT.  Her renal function has improved and she is probably a candidate for a contrasted chest CT.  Discussed with Dr. Wynetta Emery hospitalist attending.  Since she has had trouble with renal failure we both agree that it is probably best to put off the CT until tomorrow to see if she has even better improvement in her renal function particularly with recent sepsis. Active Problems:   Metabolic encephalopathy   Altered mental status   Atrial fibrillation with RVR (HCC)   Sepsis ([place])   Advanced care planning/counseling discussion   Goals of care, counseling/discussion    Plan: CT with contrast tomorrow    LOS: 2 days  [name] 10/05/2019, 7:27 AM

## 2019-10-05 NOTE — Progress Notes (Signed)
PROGRESS NOTE  [name]  YOV:[id number]  DOB: Sep 16, 1931  DOA: 10/03/2019 PCP: [name], MD  Brief Admission Hx: 85 year old female with diastolic CHF, hypertension, diabetes mellitus, chronic atrial fibrillation, CAD, CVA presented with acute confusion.  Patient was in A. fib RVR.  Patient was noted to have a UTI and was dehydrated.  She had been recently hospitalized 10/16 through 10/20 for decompensated CHF.  MDM/Assessment & Plan:   1. Acute metabolic encephalopathy-slowly improving with treatments.  Likely secondary to UTI and dehydration.  Follow clinically. 2. UTI-continue ceftriaxone IV daily pending urine cultures. 3. Mild dehydration-patient has been hydrated with IV fluids which will be discontinued now. 4. A. fib with RVR-HR slowly improving, now on oral cardizem per cardiology, continued metoprolol ER 741 mg.   5. Diastolic CHF-chronic pulmonary edema - currently compensated will DC IV fluids as she is clinically improving.  Resumed home Lasix. Monitor I/Os.  6. Essential hypertension-she has been resumed on home metoprolol.  She remains on IV Cardizem infusion. 7. History of tachybradycardia syndrome-she is status post pacemaker 07/24/2019. 8. Hypoglycemia-I have reduced her basal insulin doses.  Monitor CBG closely.  She is eating this morning which should improve her blood sugars.  DVT prophylaxis: Apixaban Code Status: Full Family Communication: Daughter Disposition Plan: Continue stepdown ICU care  Consultants:  N/A  Procedures:  N/A  Antimicrobials:  Ceftriaxone 10/03/2019-  Subjective: Patient reports occasional palpitations.  Pt denies chest pain.  No specific complaints.  Objective: Vitals:   10/05/19 0801 10/05/19 0900 10/05/19 1000 10/05/19 1141  BP:  139/90 123/74   Pulse:  (!) 125 (!) 51   Resp:  (!) 27 (!) 23   Temp: 97.7 F (36.5 C)   98.1 F (36.7 C)  TempSrc: Oral   Oral  SpO2:  95% 91%   Weight:      Height:         Intake/Output Summary (Last 24 hours) at 10/05/2019 1321 Last data filed at 10/05/2019 0500 Gross per 24 hour  Intake 463.73 ml  Output 200 ml  Net 263.73 ml   Filed Weights   10/03/19 1050 10/03/19 2041 10/04/19 0500  Weight: 66.2 kg 68.9 kg 68.9 kg   REVIEW OF SYSTEMS  As per history otherwise all reviewed and reported negative  Exam:  General exam: Elderly female awake and alert in no distress cooperative. Respiratory system: Clear. No increased work of breathing. Cardiovascular system: Irregularly irregular S1 & S2 heard.  Tachycardic rate.  No JVD, murmurs, gallops, clicks or pedal edema. Gastrointestinal system: Abdomen is nondistended, soft and nontender. Normal bowel sounds heard. Central nervous system: Alert and oriented. No focal neurological deficits. Extremities: 1+ edema bilateral lower extremities.  Data Reviewed: Basic Metabolic Panel: Recent Labs  Lab 10/03/19 1159 10/04/19 0431 10/05/19 1015  NA 136 141 139  K 4.8 3.6 4.4  CL 97* 104 102  CO2 27 28 26   GLUCOSE 483* 125* 301*  BUN 42* 36* 37*  CREATININE 1.65* 1.26* 1.54*  CALCIUM 9.5 9.2 9.2   Liver Function Tests: Recent Labs  Lab 10/03/19 1159  AST 15  ALT 18  ALKPHOS 52  BILITOT 1.1  PROT 6.5  ALBUMIN 3.5   No results for input(s): LIPASE, AMYLASE in the last 168 hours. No results for input(s): AMMONIA in the last 168 hours. CBC: Recent Labs  Lab 10/03/19 1159 10/04/19 0431  WBC 6.2 5.8  HGB 11.5* 10.5*  HCT 38.9 35.0*  MCV 92.8 91.9  PLT 122* 111*  Cardiac Enzymes: No results for input(s): CKTOTAL, CKMB, CKMBINDEX, TROPONINI in the last 168 hours. CBG (last 3)  Recent Labs    10/05/19 0339 10/05/19 0815 10/05/19 1142  GLUCAP 131* 142* 292*   Recent Results (from the past 240 hour(s))  Urine culture     Status: Abnormal (Preliminary result)   Collection Time: 10/03/19 12:05 PM   Specimen: Urine, Catheterized  Result Value Ref Range Status   Specimen Description    Final    URINE, CATHETERIZED Performed at [facility], [address], [place], [place] [address]    Special Requests   Final    NONE Performed at [facility], [address] [place], [place] [address]    Culture (A)  Final    >=100,000 COLONIES/mL GRAM NEGATIVE RODS IDENTIFICATION AND SUSCEPTIBILITIES TO FOLLOW Performed at [facility], [place] [address], [place], [place] [address]    Report Status PENDING  Incomplete  SARS CORONAVIRUS 2 (TAT 6-24 HRS) Nasopharyngeal Nasopharyngeal Swab     Status: None   Collection Time: 10/03/19  1:59 PM   Specimen: Nasopharyngeal Swab  Result Value Ref Range Status   SARS Coronavirus 2 NEGATIVE NEGATIVE Final    Comment: (NOTE) SARS-CoV-2 target nucleic acids are NOT DETECTED. The SARS-CoV-2 RNA is generally detectable in upper and lower respiratory specimens during the acute phase of infection. Negative results do not preclude SARS-CoV-2 infection, do not rule out co-infections with other pathogens, and should not be used as the sole basis for treatment or other patient management decisions. Negative results must be combined with clinical observations, patient history, and epidemiological information. The expected result is Negative. Fact Sheet for Patients: SugarRoll.be Fact Sheet for Healthcare Providers: https://www.woods-mathews.com/ This test is not yet approved or cleared by the [place] FDA and  has been authorized for detection and/or diagnosis of SARS-CoV-2 by FDA under an Emergency Use Authorization (EUA). This EUA will remain  in effect (meaning this test can be used) for the duration of the COVID-19 declaration under Section 56 4(b)(1) of the Act, 21 U.S.C. section 360bbb-3(b)(1), unless the authorization is terminated or revoked sooner. Performed at [facility], [place] [address], [place], [place] [address]   Culture, blood (routine x 2)     Status: None (Preliminary  result)   Collection Time: 10/03/19  5:13 PM   Specimen: Right Antecubital; Blood  Result Value Ref Range Status   Specimen Description RIGHT ANTECUBITAL  Final   Special Requests   Final    BOTTLES DRAWN AEROBIC AND ANAEROBIC Blood Culture adequate volume   Culture   Final    NO GROWTH 2 DAYS Performed at [facility], [address], [place], [place] [address]    Report Status PENDING  Incomplete  Culture, blood (routine x 2)     Status: None (Preliminary result)   Collection Time: 10/03/19  5:13 PM   Specimen: BLOOD RIGHT HAND  Result Value Ref Range Status   Specimen Description BLOOD RIGHT HAND  Final   Special Requests   Final    BOTTLES DRAWN AEROBIC AND ANAEROBIC Blood Culture adequate volume   Culture   Final    NO GROWTH 2 DAYS Performed at [facility], [address] [place],  [address]    Report Status PENDING  Incomplete  MRSA PCR Screening     Status: None   Collection Time: 10/03/19  8:30 PM   Specimen: Nasal Mucosa; Nasopharyngeal  Result Value Ref Range Status   MRSA  by PCR NEGATIVE NEGATIVE Final    Comment:        The GeneXpert MRSA Assay (FDA approved for NASAL specimens only), is one component of a comprehensive MRSA colonization surveillance program. It is not intended to diagnose MRSA infection nor to guide or monitor treatment for MRSA infections. Performed at [facility], [address], [place], [place] [address]      Studies: Ct Head Wo Contrast  Result Date: 10/03/2019 CLINICAL DATA:  Slurred speech and confusion. EXAM: CT HEAD WITHOUT CONTRAST TECHNIQUE: Contiguous axial images were obtained from the base of the skull through the vertex without intravenous contrast. COMPARISON:  09/05/2019 FINDINGS: Brain: No discernible change since then. Generalized brain atrophy. Old cerebellar infarctions on the right. Old right occipital infarction. Extensive chronic small-vessel ischemic changes throughout the brain. No sign of acute or subacute  infarction, mass lesion, hemorrhage, hydrocephalus or extra-axial collection. Vascular: There is atherosclerotic calcification of the major vessels at the base of the brain. Skull: Negative Sinuses/Orbits: Mucosal inflammatory changes of the maxillary sinuses. Previous functional endoscopic sinus surgery. Orbits negative. Other: None IMPRESSION: No acute finding or change since the study of 09/05/2019. Atrophy and extensive chronic small-vessel ischemic changes. 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 Electronically Signed   By: Nelson Chimes M.D.   On: 10/03/2019 19:06   Dg Chest Port 1 View  Result Date: 10/03/2019 CLINICAL DATA:  Altered mental status for 2 days EXAM: PORTABLE CHEST 1 VIEW COMPARISON:  September 27, 2019 FINDINGS: The mediastinal contour and cardiac silhouette are stable. Cardiac pacemaker is unchanged. There is pulmonary edema. Patchy consolidation with right pleural effusion is identified in the right lung base. The bony structures are stable. IMPRESSION: Mild pulmonary edema. Right pleural effusion. Patchy consolidation of right lung base, suspicious for pneumonia. Electronically Signed   By: Abelardo Diesel M.D.   On: 10/03/2019 16:47   Scheduled Meds: . apixaban  2.5 mg Oral BID  . atorvastatin  20 mg Oral QHS  . Chlorhexidine Gluconate Cloth  6 each Topical Daily  . diltiazem  30 mg Oral Q6H  . furosemide  40 mg Oral BID  . hydrocortisone  25 mg Rectal BID  . insulin aspart  0-9 Units Subcutaneous TID WC  . insulin aspart  2 Units Subcutaneous TID WC  . insulin detemir  16 Units Subcutaneous Q2200  . metoprolol succinate  100 mg Oral Daily  . pantoprazole  40 mg Oral Daily  . PARoxetine  20 mg Oral q morning - 10a  . polyethylene glycol  17 g Oral Daily  . ramipril  5 mg Oral Daily  . senna  1 tablet Oral BID   Continuous Infusions: . cefTRIAXone (ROCEPHIN)  IV 1 g (10/05/19 0935)   Active Problems:   Metabolic encephalopathy   Altered mental  status   Atrial fibrillation with RVR ([place])   Sepsis ([place])   Advanced care planning/counseling discussion   Goals of care, counseling/discussion   Palliative care by specialist   Hemorrhoids  Critical care time spent: 31 minutes  Irwin Brakeman, MD [facility] 10/05/2019, 1:21 PM    LOS: 2 days  How to contact the [facility] Attending or Consulting provider [place] or covering provider during after hours [place], for this patient?  1. Check the care team in [facility] and look for a) attending/consulting TRH provider listed and b) the [facility] team listed 2. Log into [email or web address] and use [place]'s universal password to access. If you do  not have the password, please contact the hospital operator. 3. Locate the [facility] provider you are looking for under [facility] and page to a number that you can be directly reached. 4. If you still have difficulty reaching the provider, please page the [facility] (Director on Call) for the Hospitalists listed on amion for assistance.

## 2019-10-05 NOTE — Plan of Care (Signed)
  Problem: Acute Rehab PT Goals(only PT should resolve) Goal: Pt Will Go Supine/Side To Sit Flowsheets (Taken 10/05/2019 1643) Pt will go Supine/Side to Sit: with minimal assist Goal: Patient Will Transfer Sit To/From Stand Flowsheets (Taken 10/05/2019 1643) Patient will transfer sit to/from stand: with minimal assist Goal: Pt Will Transfer Bed To Chair/Chair To Bed Flowsheets (Taken 10/05/2019 1643) Pt will Transfer Bed to Chair/Chair to Bed: with min assist Goal: Pt Will Ambulate Flowsheets (Taken 10/05/2019 1643) Pt will Ambulate:  25 feet  with minimal assist  with rolling walker   4:44 PM, 10/05/19 Josue Hector PT DPT  Physical Therapist with [facility]  [phone]

## 2019-10-05 NOTE — TOC Initial Note (Addendum)
Transition of Care [facility]) - Initial/Assessment Note    Patient Details  Name: [name] MRN: [id number] Date of Birth: 1931-02-23  Transition of Care [facility]) CM/SW Contact:    Nichollas Perusse, Chauncey [place], RN Phone Number: 10/05/2019, 2:00 PM  Clinical Narrative:    AMS. Recent admission, previous assessment as follows:   "  Pt admitted from home. She is high risk for readmission.  Pt and dtr live together in their home in [place]. Dtr assists with ADLs as necessary. Pt has walker but daughter is asking about a "stand up walker with higher arms". Pt also has a BSC. Dtr working on trying to get her a PureWick."    Patient high risk admission again. Setup with home health last admission with [facility] on 10/20, spoke with [place], she said patient was scheduled to be seen today 10/29.   PT eval pending today. Palliative has been consulted and having ongoing conversations.    CT of chest scheduled for tomorrow to evaluate mass versus fluid consolidation on previous CT.   TOC to follow recommendations and assist as needed.    ADDENDUM: CM consulted for OP Palliative services at home. Discussed with [name], patient's daughter, referral made to [place].   Expected Discharge Plan: [place]    Expected Discharge Plan and Services Expected Discharge Plan: [place]   Discharge Planning Services: CM Consult Post Acute Care Choice: Durable Medical Equipment, Home Health Living arrangements for the past 2 months: Single Family Home                     Prior Living Arrangements/Services Living arrangements for the past 2 months: Single Family Home Lives with:: Adult Children          Need for Family Participation in Patient Care: Yes (Comment) Care giver support system in place?: Yes (comment) Current home services: DME    Activities of Daily Living Home Assistive Devices/Equipment: Dentures (specify type), Eyeglasses,  Blood pressure cuff, Bedside commode/3-in-1, Walker (specify type), CBG Meter, Oxygen           Admission diagnosis:  Metabolic encephalopathy [O13.08] Altered mental status, unspecified altered mental status type [R41.82] Atrial fibrillation with RVR ([place]) [I48.91] Patient Active Problem List   Diagnosis Date Noted  . Palliative care by specialist   . Hemorrhoids   . Sepsis ([place])   . Advanced care planning/counseling discussion   . Goals of care, counseling/discussion   . Altered mental status 10/03/2019  . Atrial fibrillation with RVR ([place]) 10/03/2019  . CHF (congestive heart failure) ([place]) 09/24/2019  . Atrial fibrillation with rapid ventricular response ([place]) 07/18/2019  . Nocturnal enuresis 07/03/2019  . Mixed stress and urge urinary incontinence 07/03/2019  . Generalized weakness 06/28/2019  . Symptomatic bradycardia 06/28/2019  . Apnea 02/15/2019  . Snoring 02/15/2019  . Polyp of colon   . Accelerated hypertension 01/20/2019  . Rectal bleeding 11/08/2018  . Rectal pain 11/08/2018  . Abnormal CT scan, colon 11/08/2018  . (HFpEF) heart failure with preserved ejection fraction ([place]) 10/09/2018  . Acute on chronic diastolic CHF (congestive heart failure) ([place]) 10/09/2018  . Aortic stenosis, moderate 10/09/2018  . Mitral valve stenosis 10/09/2018  . Acute exacerbation of CHF (congestive heart failure) ([place]) 10/07/2018  . Acute hypoxemic respiratory failure ([place]) 10/07/2018  . Chronic anemia 10/07/2018  . Asthma 10/07/2018  . CVA (cerebral vascular accident) ([place]) 10/07/2018  . CAD (coronary artery disease) 10/07/2018  .  Constipation 10/07/2018  . Physical deconditioning 10/07/2018  . History of recurrent UTIs 10/07/2018  . CKD stage 3 due to type 2 diabetes mellitus ([place]) 12/22/2017  . Dyslipidemia 12/22/2017  . Weakness 11/23/2017  . Overweight (BMI 25.0-29.9) 05/08/2016  . UTI (lower urinary tract infection) 11/12/2015  . Type 2 diabetes mellitus with stage 3b  chronic kidney disease, with long-term current use of insulin ([place]) 11/12/2015  . Dementia ([place])   . Metabolic encephalopathy 09/62/8366  . S/P right TKA 08/13/2015  . S/P knee replacement 08/13/2015  . Elevated troponin 03/06/2015  . Fever   . History of diabetes mellitus   . Left buttock pain   . Mixed hyperlipidemia 01/11/2015  . GAD (generalized anxiety disorder) 01/11/2015  . Depression 01/11/2015  . GERD (gastroesophageal reflux disease) 01/11/2015  . Osteopenia 07/13/2014  . Obesity (BMI 30-39.9) 06/19/2014  . Carotid stenosis   . Anemia of chronic disease 12/25/2011  . Iron deficiency anemia 05/27/2011  . Diastolic CHF, chronic ([place]) 04/02/2010  . Type 2 diabetes mellitus with insulin therapy ([place]) 10/08/2009  . HLD (hyperlipidemia) 10/08/2009  . HTN (hypertension) 10/08/2009  . Coronary atherosclerosis 10/08/2009  . CAROTID STENOSIS 10/08/2009   PCP:  [name], MD Pharmacy:   [facility], [place] [address] [place] [address] Phone: [phone] Fax: [facility], [place] [address] Phone: [phone] Fax: [phone]     Social Determinants of Health (SDOH) Interventions    Readmission Risk Interventions Readmission Risk Prevention Plan 09/25/2019  Transportation Screening Complete  Medication Review ([place]) Complete  PCP or Specialist appointment within 3-5 days of discharge Complete  HRI or [place] Complete  SW Recovery Care/Counseling Consult Complete  [place] Not Applicable  Some recent data might be hidden

## 2019-10-05 NOTE — Progress Notes (Signed)
Daily Progress Note   Patient Name: [name]       Date: 10/05/2019 DOB: [birth year]  Age: 85 y.o. MRN#: [id number] Attending Physician: Murlean Iba, MD Primary Care Physician: [name], MD Admit Date: 10/03/2019  Reason for Consultation/Follow-up: Establishing goals of care  Subjective: Patient in bed, daughter [name] at bedside. Patient complaining of lower abdominal pain. [name] notes she has not had BM since admission. She also has internal hemorrhoids. No vomiting. Ate breakfast.  [name] expressed much grief over thinking of her mother's EOL. The discussion yesterday regarding advanced directives brought back memories and grief from her sudden Father's death which she witnessed. Emotional support offered.  Updated [name] regarding plan for contrast CT tomorrow if patient's kidney function is improving.   Review of Systems  Unable to perform ROS: Mental status change    Length of Stay: 2  Current Medications: Scheduled Meds:  . apixaban  2.5 mg Oral BID  . atorvastatin  20 mg Oral QHS  . Chlorhexidine Gluconate Cloth  6 each Topical Daily  . diltiazem  30 mg Oral Q6H  . furosemide  40 mg Oral BID  . hydrocortisone  25 mg Rectal BID  . insulin aspart  0-9 Units Subcutaneous TID WC  . insulin aspart  2 Units Subcutaneous TID WC  . insulin detemir  16 Units Subcutaneous Q2200  . metoprolol succinate  100 mg Oral Daily  . pantoprazole  40 mg Oral Daily  . PARoxetine  20 mg Oral q morning - 10a  . polyethylene glycol  17 g Oral Daily  . ramipril  5 mg Oral Daily  . senna  1 tablet Oral BID    Continuous Infusions: . cefTRIAXone (ROCEPHIN)  IV 1 g (10/05/19 0935)    PRN Meds: acetaminophen **OR** acetaminophen, LORazepam, ondansetron **OR** ondansetron  (ZOFRAN) IV  Physical Exam Vitals signs and nursing note reviewed.  Abdominal:     General: There is no distension.     Palpations: Abdomen is soft. There is no mass.  Neurological:     Comments: Pleasantly confused             Vital Signs: BP 123/74   Pulse (!) 51   Temp 98.1 F (36.7 C) (Oral)   Resp (!) 23   Ht 5\' 6"  (1.676 m)  Wt 68.9 kg   SpO2 91%   BMI 24.52 kg/m  SpO2: SpO2: 91 % O2 Device: O2 Device: Room Air O2 Flow Rate:    Intake/output summary:   Intake/Output Summary (Last 24 hours) at 10/05/2019 1237 Last data filed at 10/05/2019 0500 Gross per 24 hour  Intake 463.73 ml  Output 200 ml  Net 263.73 ml   LBM:   Baseline Weight: Weight: 66.2 kg Most recent weight: Weight: 68.9 kg       Palliative Assessment/Data: PPS: 10%      Patient Active Problem List   Diagnosis Date Noted  . Sepsis ([place])   . Advanced care planning/counseling discussion   . Goals of care, counseling/discussion   . Altered mental status 10/03/2019  . Atrial fibrillation with RVR ([place]) 10/03/2019  . CHF (congestive heart failure) ([place]) 09/24/2019  . Atrial fibrillation with rapid ventricular response ([place]) 07/18/2019  . Nocturnal enuresis 07/03/2019  . Mixed stress and urge urinary incontinence 07/03/2019  . Generalized weakness 06/28/2019  . Symptomatic bradycardia 06/28/2019  . Apnea 02/15/2019  . Snoring 02/15/2019  . Polyp of colon   . Accelerated hypertension 01/20/2019  . Rectal bleeding 11/08/2018  . Rectal pain 11/08/2018  . Abnormal CT scan, colon 11/08/2018  . (HFpEF) heart failure with preserved ejection fraction ([place]) 10/09/2018  . Acute on chronic diastolic CHF (congestive heart failure) ([place]) 10/09/2018  . Aortic stenosis, moderate 10/09/2018  . Mitral valve stenosis 10/09/2018  . Acute exacerbation of CHF (congestive heart failure) ([place]) 10/07/2018  . Acute hypoxemic respiratory failure ([place]) 10/07/2018  . Chronic anemia 10/07/2018  . Asthma  10/07/2018  . CVA (cerebral vascular accident) ([place]) 10/07/2018  . CAD (coronary artery disease) 10/07/2018  . Constipation 10/07/2018  . Physical deconditioning 10/07/2018  . History of recurrent UTIs 10/07/2018  . CKD stage 3 due to type 2 diabetes mellitus ([place]) 12/22/2017  . Dyslipidemia 12/22/2017  . Weakness 11/23/2017  . Overweight (BMI 25.0-29.9) 05/08/2016  . UTI (lower urinary tract infection) 11/12/2015  . Type 2 diabetes mellitus with stage 3b chronic kidney disease, with long-term current use of insulin ([place]) 11/12/2015  . Dementia ([place])   . Metabolic encephalopathy 18/56/3149  . S/P right TKA 08/13/2015  . S/P knee replacement 08/13/2015  . Elevated troponin 03/06/2015  . Fever   . History of diabetes mellitus   . Left buttock pain   . Mixed hyperlipidemia 01/11/2015  . GAD (generalized anxiety disorder) 01/11/2015  . Depression 01/11/2015  . GERD (gastroesophageal reflux disease) 01/11/2015  . Osteopenia 07/13/2014  . Obesity (BMI 30-39.9) 06/19/2014  . Carotid stenosis   . Anemia of chronic disease 12/25/2011  . Iron deficiency anemia 05/27/2011  . Diastolic CHF, chronic ([place]) 04/02/2010  . Type 2 diabetes mellitus with insulin therapy ([place]) 10/08/2009  . HLD (hyperlipidemia) 10/08/2009  . HTN (hypertension) 10/08/2009  . Coronary atherosclerosis 10/08/2009  . CAROTID STENOSIS 10/08/2009    Palliative Care Assessment & Plan   Patient Profile:  85 y.o. female  with past medical history of CHF, HTN, DM, atrial fib lung mass on CT (pt to followup with pulmonologist but has not yet) admitted on 10/03/2019 with confusion. Workup revealed urosepsis. Palliative medicine consulted for [place].   Assessment/Recommendations/Plan   Continue current scope  Anusol suppository BID for hemorrhoids  Constipation- Miralax 17g now, senna 1 po bid qd  Spiritual care visit offered- daughter declined  Palliative outpatient recommended  Goals of Care and Additional  Recommendations:  Limitations on Scope of Treatment: Full  Scope Treatment  Code Status:  DNR  Prognosis:   Unable to determine  Discharge Planning:  To Be Determined   Care plan was discussed with patient's daughter, [name]  Thank you for allowing the Palliative Medicine Team to assist in the care of this patient.   Time In: 1055 Time Out: 1130 Total Time 35 mins Prolonged Time Billed no      Greater than 50%  of this time was spent counseling and coordinating care related to the above assessment and plan.  Mariana Kaufman, AGNP-C Palliative Medicine   Please contact Palliative Medicine Team phone at [phone] for questions and concerns.

## 2019-10-06 ENCOUNTER — Inpatient Hospital Stay (HOSPITAL_COMMUNITY): Payer: Medicare Other

## 2019-10-06 ENCOUNTER — Ambulatory Visit (HOSPITAL_COMMUNITY): Payer: Medicare Other

## 2019-10-06 DIAGNOSIS — G9341 Metabolic encephalopathy: Secondary | ICD-10-CM | POA: Diagnosis not present

## 2019-10-06 DIAGNOSIS — R4182 Altered mental status, unspecified: Secondary | ICD-10-CM | POA: Diagnosis not present

## 2019-10-06 DIAGNOSIS — I4891 Unspecified atrial fibrillation: Secondary | ICD-10-CM | POA: Diagnosis not present

## 2019-10-06 DIAGNOSIS — Z950 Presence of cardiac pacemaker: Secondary | ICD-10-CM | POA: Diagnosis not present

## 2019-10-06 DIAGNOSIS — A419 Sepsis, unspecified organism: Secondary | ICD-10-CM | POA: Diagnosis not present

## 2019-10-06 DIAGNOSIS — Z7189 Other specified counseling: Secondary | ICD-10-CM | POA: Diagnosis not present

## 2019-10-06 LAB — GLUCOSE, CAPILLARY
Glucose-Capillary: 180 mg/dL — ABNORMAL HIGH (ref 70–99)
Glucose-Capillary: 80 mg/dL (ref 70–99)
Glucose-Capillary: 88 mg/dL (ref 70–99)
Glucose-Capillary: 123 mg/dL — ABNORMAL HIGH (ref 70–99)
Glucose-Capillary: 129 mg/dL — ABNORMAL HIGH (ref 70–99)

## 2019-10-06 LAB — BASIC METABOLIC PANEL
Anion gap: 9 (ref 5–15)
BUN: 38 mg/dL — ABNORMAL HIGH (ref 8–23)
CO2: 27 mmol/L (ref 22–32)
Calcium: 9.2 mg/dL (ref 8.9–10.3)
Chloride: 103 mmol/L (ref 98–111)
Creatinine, Ser: 1.57 mg/dL — ABNORMAL HIGH (ref 0.44–1.00)
GFR calc Af Amer: 34 mL/min — ABNORMAL LOW (ref >60)
GFR calc non Af Amer: 30 mL/min — ABNORMAL LOW (ref >60)
Glucose, Bld: 142 mg/dL — ABNORMAL HIGH (ref 70–99)
Potassium: 4.1 mmol/L (ref 3.5–5.1)
Sodium: 139 mmol/L (ref 135–145)

## 2019-10-06 LAB — CBC
HCT: 37.7 % (ref 36.0–46.0)
Hemoglobin: 11.5 g/dL — ABNORMAL LOW (ref 12.0–15.0)
MCH: 28.1 pg (ref 26.0–34.0)
MCHC: 30.5 g/dL (ref 30.0–36.0)
MCV: 92.2 fL (ref 80.0–100.0)
Platelets: 127 10*3/uL — ABNORMAL LOW (ref 150–400)
RBC: 4.09 MIL/uL (ref 3.87–5.11)
RDW: 16 % — ABNORMAL HIGH (ref 11.5–15.5)
WBC: 4.9 10*3/uL (ref 4.0–10.5)
nRBC: 0 % (ref 0.0–0.2)

## 2019-10-06 MED ORDER — METOPROLOL TARTRATE 5 MG/5ML IV SOLN
5.0000 mg | Freq: Once | INTRAVENOUS | Status: AC
  Administered 2019-10-06: 02:00:00 5 mg via INTRAVENOUS
  Filled 2019-10-06: qty 5

## 2019-10-06 MED ORDER — DILTIAZEM HCL ER COATED BEADS 120 MG PO CP24
120.0000 mg | ORAL_CAPSULE | Freq: Every day | ORAL | Status: DC
  Administered 2019-10-06 – 2019-10-07 (×2): 120 mg via ORAL
  Filled 2019-10-06 (×3): qty 1

## 2019-10-06 MED ORDER — FUROSEMIDE 10 MG/ML IJ SOLN
40.0000 mg | Freq: Two times a day (BID) | INTRAMUSCULAR | Status: DC
  Administered 2019-10-06 – 2019-10-07 (×2): 40 mg via INTRAVENOUS
  Filled 2019-10-06 (×2): qty 4

## 2019-10-06 NOTE — TOC Progression Note (Signed)
Transition of Care [facility]) - Progression Note    Patient Details  Name: [name] MRN: [id number] Date of Birth: 1931/07/23  Transition of Care [facility]) CM/SW Contact  Shade Flood, LCSW Phone Number: 10/06/2019, 10:41 AM  Clinical Narrative:     TOC following. Pt status discussed with MD in Progression today. PT recommending SNF rehab at dc. Spoke with pt and her daughter to discuss this recommendation. Pt states that she does not want to be referred to a SNF. She wants to go home at dc with continued [facility] PT from [place]. Yesterday, TOC also made a referral to Outpatient Palliative Care for follow up at home.   Updated MD. TOC will follow through DC.  Expected Discharge Plan: [place] Barriers to Discharge: Continued Medical Work up  Expected Discharge Plan and Services Expected Discharge Plan: [place]   Discharge Planning Services: CM Consult Post Acute Care Choice: Durable Medical Equipment, Home Health Living arrangements for the past 2 months: Single Family Home                                       Social Determinants of Health (SDOH) Interventions    Readmission Risk Interventions Readmission Risk Prevention Plan 09/25/2019  Transportation Screening Complete  Medication Review Press photographer) Complete  PCP or Specialist appointment within 3-5 days of discharge Complete  HRI or [place] Complete  SW Recovery Care/Counseling Consult Complete  [place] Not Applicable  Some recent data might be hidden

## 2019-10-06 NOTE — Progress Notes (Signed)
PROGRESS NOTE  [name]  OVF:[id number]  DOB: [birth year]  DOA: 10/03/2019 PCP: [name], MD  Brief Admission Hx: 85 year old female with diastolic CHF, hypertension, diabetes mellitus, chronic atrial fibrillation, CAD, CVA presented with acute confusion.  Patient was in A. fib RVR.  Patient was noted to have a UTI and was dehydrated.  She had been recently hospitalized 10/16 through 10/20 for decompensated CHF.  MDM/Assessment & Plan:   1. Acute metabolic encephalopathy-slowly improving with treatments.  Likely secondary to UTI and dehydration.  Pt seems to be baseline. 2. UTI-continue ceftriaxone IV daily pending urine cultures.  Plan to DC after 3 doses.  3. Mild dehydration-patient was hydrated with IV fluids which will be discontinued now. She is eating and drinking better now.  4. A. fib with RVR-HR slowly improving, now on oral cardizem per cardiology, continued metoprolol ER 841 mg.   5. Diastolic CHF-chronic pulmonary edema - currently compensated will DC IV fluids as she is clinically improving.  Resumed home Lasix. Monitor I/Os.  6. Essential hypertension-she has been resumed on home metoprolol.  She remains on IV Cardizem infusion. 7. History of tachybradycardia syndrome-she is status post pacemaker 07/24/2019. 8. Hypoglycemia-I have reduced her basal insulin doses.  Monitor CBG closely.  She is eating this morning which should improve her blood sugars.  DVT prophylaxis: Apixaban Code Status: Full Family Communication: Daughter Disposition Plan: transfer to telemetry  Consultants:  N/A  Procedures:  N/A  Antimicrobials:  Ceftriaxone 10/03/2019-  Subjective: Patient denies chest pain and SOB.    Objective: Vitals:   10/06/19 0738 10/06/19 0904 10/06/19 0940 10/06/19 1132  BP:  (!) 145/80    Pulse:  (!) 125 76   Resp:  (!) 34 (!) 29   Temp: 97.6 F (36.4 C)   97.7 F (36.5 C)  TempSrc: Oral   Oral  SpO2:  95% 93%   Weight:      Height:         Intake/Output Summary (Last 24 hours) at 10/06/2019 1146 Last data filed at 10/06/2019 0500 Gross per 24 hour  Intake 127.48 ml  Output 700 ml  Net -572.52 ml   Filed Weights   10/03/19 1050 10/03/19 2041 10/04/19 0500  Weight: 66.2 kg 68.9 kg 68.9 kg   REVIEW OF SYSTEMS  As per history otherwise all reviewed and reported negative  Exam:  General exam: Elderly female awake and alert in no distress cooperative. Respiratory system: Clear. No increased work of breathing. Cardiovascular system: Irregularly irregular S1 & S2 heard.  Tachycardic rate.  No JVD, murmurs, gallops, clicks or pedal edema. Gastrointestinal system: Abdomen is nondistended, soft and nontender. Normal bowel sounds heard. Central nervous system: Alert and oriented. No focal neurological deficits. Extremities: 1+ edema bilateral lower extremities.  Data Reviewed: Basic Metabolic Panel: Recent Labs  Lab 10/03/19 1159 10/04/19 0431 10/05/19 1015 10/06/19 0443  NA 136 141 139 139  K 4.8 3.6 4.4 4.1  CL 97* 104 102 103  CO2 27 28 26 27   GLUCOSE 483* 125* 301* 142*  BUN 42* 36* 37* 38*  CREATININE 1.65* 1.26* 1.54* 1.57*  CALCIUM 9.5 9.2 9.2 9.2   Liver Function Tests: Recent Labs  Lab 10/03/19 1159  AST 15  ALT 18  ALKPHOS 52  BILITOT 1.1  PROT 6.5  ALBUMIN 3.5   No results for input(s): LIPASE, AMYLASE in the last 168 hours. No results for input(s): AMMONIA in the last 168 hours. CBC: Recent Labs  Lab 10/03/19 1159 10/04/19  0431 10/06/19 0443  WBC 6.2 5.8 4.9  HGB 11.5* 10.5* 11.5*  HCT 38.9 35.0* 37.7  MCV 92.8 91.9 92.2  PLT 122* 111* 127*   Cardiac Enzymes: No results for input(s): CKTOTAL, CKMB, CKMBINDEX, TROPONINI in the last 168 hours. CBG (last 3)  Recent Labs    10/06/19 0230 10/06/19 0739 10/06/19 1134  GLUCAP 180* 80 88   Recent Results (from the past 240 hour(s))  Urine culture     Status: Abnormal   Collection Time: 10/03/19 12:05 PM   Specimen: Urine,  Catheterized  Result Value Ref Range Status   Specimen Description   Final    URINE, CATHETERIZED Performed at [facility], [address], [place], [place] [address]    Special Requests   Final    NONE Performed at [facility], [address], [place], [place] [address]    Culture >=100,000 COLONIES/mL ESCHERICHIA COLI (A)  Final   Report Status 10/05/2019 FINAL  Final   Organism ID, Bacteria ESCHERICHIA COLI (A)  Final      Susceptibility   Escherichia coli - MIC*    AMPICILLIN <=2 SENSITIVE Sensitive     CEFAZOLIN <=4 SENSITIVE Sensitive     CEFTRIAXONE <=1 SENSITIVE Sensitive     CIPROFLOXACIN <=0.25 SENSITIVE Sensitive     GENTAMICIN <=1 SENSITIVE Sensitive     IMIPENEM <=0.25 SENSITIVE Sensitive     NITROFURANTOIN <=16 SENSITIVE Sensitive     TRIMETH/SULFA <=20 SENSITIVE Sensitive     AMPICILLIN/SULBACTAM <=2 SENSITIVE Sensitive     PIP/TAZO <=4 SENSITIVE Sensitive     Extended ESBL NEGATIVE Sensitive     * >=100,000 COLONIES/mL ESCHERICHIA COLI  SARS CORONAVIRUS 2 (TAT 6-24 HRS) Nasopharyngeal Nasopharyngeal Swab     Status: None   Collection Time: 10/03/19  1:59 PM   Specimen: Nasopharyngeal Swab  Result Value Ref Range Status   SARS Coronavirus 2 NEGATIVE NEGATIVE Final    Comment: (NOTE) SARS-CoV-2 target nucleic acids are NOT DETECTED. The SARS-CoV-2 RNA is generally detectable in upper and lower respiratory specimens during the acute phase of infection. Negative results do not preclude SARS-CoV-2 infection, do not rule out co-infections with other pathogens, and should not be used as the sole basis for treatment or other patient management decisions. Negative results must be combined with clinical observations, patient history, and epidemiological information. The expected result is Negative. Fact Sheet for Patients: SugarRoll.be Fact Sheet for Healthcare Providers: https://www.woods-mathews.com/ This test is not yet  approved or cleared by the [place] FDA and  has been authorized for detection and/or diagnosis of SARS-CoV-2 by FDA under an Emergency Use Authorization (EUA). This EUA will remain  in effect (meaning this test can be used) for the duration of the COVID-19 declaration under Section 56 4(b)(1) of the Act, 21 U.S.C. section 360bbb-3(b)(1), unless the authorization is terminated or revoked sooner. Performed at [facility], [place] [address], [place], [place] [address]   Culture, blood (routine x 2)     Status: None (Preliminary result)   Collection Time: 10/03/19  5:13 PM   Specimen: Right Antecubital; Blood  Result Value Ref Range Status   Specimen Description RIGHT ANTECUBITAL  Final   Special Requests   Final    BOTTLES DRAWN AEROBIC AND ANAEROBIC Blood Culture adequate volume   Culture   Final    NO GROWTH 3 DAYS Performed at [facility], [address] [place], [place] [address]    Report Status PENDING  Incomplete  Culture, blood (  routine x 2)     Status: None (Preliminary result)   Collection Time: 10/03/19  5:13 PM   Specimen: BLOOD RIGHT HAND  Result Value Ref Range Status   Specimen Description BLOOD RIGHT HAND  Final   Special Requests   Final    BOTTLES DRAWN AEROBIC AND ANAEROBIC Blood Culture adequate volume   Culture   Final    NO GROWTH 3 DAYS Performed at [facility], [address] [place], [place] [address]    Report Status PENDING  Incomplete  MRSA PCR Screening     Status: None   Collection Time: 10/03/19  8:30 PM   Specimen: Nasal Mucosa; Nasopharyngeal  Result Value Ref Range Status   MRSA by PCR NEGATIVE NEGATIVE Final    Comment:        The GeneXpert MRSA Assay (FDA approved for NASAL specimens only), is one component of a comprehensive MRSA colonization surveillance program. It is not intended to diagnose MRSA infection nor to guide or monitor treatment for MRSA infections. Performed at [facility], [address] [place],  [place] [address]      Studies: No results found. Scheduled Meds: . apixaban  2.5 mg Oral BID  . atorvastatin  20 mg Oral QHS  . Chlorhexidine Gluconate Cloth  6 each Topical Daily  . diltiazem  120 mg Oral Daily  . furosemide  40 mg Oral BID  . hydrocortisone  25 mg Rectal BID  . insulin aspart  0-9 Units Subcutaneous TID WC  . insulin aspart  4 Units Subcutaneous TID WC  . insulin detemir  16 Units Subcutaneous Q2200  . metoprolol succinate  100 mg Oral Daily  . pantoprazole  40 mg Oral Daily  . PARoxetine  20 mg Oral q morning - 10a  . polyethylene glycol  17 g Oral Daily  . ramipril  5 mg Oral Daily  . senna  1 tablet Oral BID   Continuous Infusions: . cefTRIAXone (ROCEPHIN)  IV 1 g (10/06/19 1032)   Active Problems:   Metabolic encephalopathy   Altered mental status   Atrial fibrillation with RVR ([place])   Sepsis ([place])   Advanced care planning/counseling discussion   Goals of care, counseling/discussion   Palliative care by specialist   Hemorrhoids  Critical Care Procedure Note Authorized and Performed by:  Murvin Natal MD  Total Critical Care time:  31 minutes  Due to a high probability of clinically significant, life threatening deterioration, the patient required my highest level of preparedness to intervene emergently and I personally spent this critical care time directly and personally managing the patient.  This critical care time included obtaining a history; examining the patient, pulse oximetry; ordering and review of studies; arranging urgent treatment with development of a management plan; evaluation of patient's response of treatment; frequent reassessment; and discussions with other providers.  This critical care time was performed to assess and manage the high probability of imminent and life threatening deterioration that could result in multi-organ failure.  It was exclusive of separately billable procedures and treating other patients and teaching time.    Irwin Brakeman, MD [facility] 10/06/2019, 11:46 AM    LOS: 3 days  How to contact the [facility] Attending or Consulting provider [place] or covering provider during after hours [place], for this patient?  1. Check the care team in [facility] and look for a) attending/consulting TR[facility] provider listed and b) the [facility] team listed 2. Log into [email or web address] and use [facility]'s universal password to access. If you do not have the password, please contact the hospital operator. 3. Locate the [facility] provider you are looking for under [facility] and page to a number that you can be directly reached. 4. If you still have difficulty reaching the provider, please page the [facility] (Director on Call) for the Hospitalists listed on amion for assistance.

## 2019-10-06 NOTE — Progress Notes (Signed)
Rechecked patients BP manually: 152/90. Placed automatic cuff on patient's ankle: 148/104.

## 2019-10-06 NOTE — Progress Notes (Signed)
Progress Note  Patient Name: [name] Date of Encounter: 10/06/2019  Primary Cardiologist: Minus Breeding, MD   Subjective   Resting comfortably.  She received last dose of oral Cardizem at 5:09 AM.  Spoke with her nurse.  Inpatient Medications    Scheduled Meds: . apixaban  2.5 mg Oral BID  . atorvastatin  20 mg Oral QHS  . Chlorhexidine Gluconate Cloth  6 each Topical Daily  . diltiazem  30 mg Oral Q6H  . furosemide  40 mg Oral BID  . hydrocortisone  25 mg Rectal BID  . insulin aspart  0-9 Units Subcutaneous TID WC  . insulin aspart  4 Units Subcutaneous TID WC  . insulin detemir  16 Units Subcutaneous Q2200  . metoprolol succinate  100 mg Oral Daily  . pantoprazole  40 mg Oral Daily  . PARoxetine  20 mg Oral q morning - 10a  . polyethylene glycol  17 g Oral Daily  . ramipril  5 mg Oral Daily  . senna  1 tablet Oral BID   Continuous Infusions: . cefTRIAXone (ROCEPHIN)  IV Stopped (10/05/19 1005)   PRN Meds: LORazepam, ondansetron **OR** ondansetron (ZOFRAN) IV, traMADol   Vital Signs    Vitals:   10/06/19 0400 10/06/19 0500 10/06/19 0509 10/06/19 0738  BP: (!) 147/124 (!) 152/116 (!) 152/116   Pulse: (!) 25 (!) 105    Resp: (!) 21 (!) 21    Temp: (!) 97.5 F (36.4 C)   97.6 F (36.4 C)  TempSrc:    Oral  SpO2: 93% 94%    Weight:      Height:        Intake/Output Summary (Last 24 hours) at 10/06/2019 0928 Last data filed at 10/06/2019 0500 Gross per 24 hour  Intake 127.48 ml  Output 700 ml  Net -572.52 ml   Filed Weights   10/03/19 1050 10/03/19 2041 10/04/19 0500  Weight: 66.2 kg 68.9 kg 68.9 kg    Telemetry    Atrial fibrillation, 90-low 100 bpm range- Personally Reviewed   Physical Exam   GEN: No acute distress.   Neck: No JVD Cardiac: Irregular, 3/6 ESM at RUSB  Respiratory: Clear to auscultation bilaterally. GI: Soft, nontender, non-distended  MS: No edema; No deformity. Neuro:  Nonfocal  Psych: Normal affect   Labs     Chemistry Recent Labs  Lab 10/03/19 1159 10/04/19 0431 10/05/19 1015 10/06/19 0443  NA 136 141 139 139  K 4.8 3.6 4.4 4.1  CL 97* 104 102 103  CO2 27 28 26 27   GLUCOSE 483* 125* 301* 142*  BUN 42* 36* 37* 38*  CREATININE 1.65* 1.26* 1.54* 1.57*  CALCIUM 9.5 9.2 9.2 9.2  PROT 6.5  --   --   --   ALBUMIN 3.5  --   --   --   AST 15  --   --   --   ALT 18  --   --   --   ALKPHOS 52  --   --   --   BILITOT 1.1  --   --   --   GFRNONAA 28* 39* 30* 30*  GFRAA 32* 45* 35* 34*  ANIONGAP 12 9 11 9      Hematology Recent Labs  Lab 10/03/19 1159 10/04/19 0431 10/06/19 0443  WBC 6.2 5.8 4.9  RBC 4.19 3.81* 4.09  HGB 11.5* 10.5* 11.5*  HCT 38.9 35.0* 37.7  MCV 92.8 91.9 92.2  MCH 27.4 27.6 28.1  MCHC 29.6* 30.0 30.5  RDW 15.7* 15.7* 16.0*  PLT 122* 111* 127*    Cardiac EnzymesNo results for input(s): TROPONINI in the last 168 hours. No results for input(s): TROPIPOC in the last 168 hours.   BNPNo results for input(s): BNP, PROBNP in the last 168 hours.   DDimer No results for input(s): DDIMER in the last 168 hours.   Radiology    No results found.  Cardiac Studies   Relevant CV Studies:  Echocardiogram: 06/29/2019 IMPRESSIONS   1. The left ventricle has normal systolic function with an ejection fraction of 60-65%. The cavity size was normal. There is moderate concentric left ventricular hypertrophy. Left ventricular diastolic Doppler parameters are consistent with  pseudonormalization. Elevated mean left atrial pressure. 2. The right ventricle has normal systolic function. The cavity was normal. There is no increase in right ventricular wall thickness. Right ventricular systolic pressure is mildly elevated with an estimated pressure of 36.6 mmHg. 3. Left atrial size was severely dilated. 4. The mitral valve is degenerative. Mild thickening of the mitral valve leaflet. Mild calcification of the mitral valve leaflet. There is severe mitral annular calcification  present. Mitral valve regurgitation is mild to moderate by color flow Doppler. Moderate mitral valve stenosis. 5. The aortic valve is tricuspid. Moderate thickening of the aortic valve. Moderate calcification of the aortic valve. Moderate stenosis of the aortic valve. 6. The aorta is normal in size and structure. 7. The inferior vena cava was dilated in size with >50% respiratory variability. 8. When compared to the prior study: 10/08/2018, there is very slight worsening of the aortic stenosis. Mitral valve gradients appear lower due to bradycardia.   NST: 06/2019  There was no ST segment deviation noted during stress.  Defect 1: There is a small defect of mild severity.  This is a low risk study.  Nuclear stress EF: 57%.  No significant reversible ischemia. LVEF 57% with normal wall motion. This is a low risk study.  Patient Profile     85 y.o. female with past medical history of CAD (s/p CABG in 2006), carotid artery stenosis, aortic stenosis,persistent atrial fibrillation, tachy-brady syndrome (s/p Medtronic PPM placement in 07/2019),HTN, HLD, and Type 2 DM, Stage 3 CKD, and prior CVAwho is being seen today for the evaluation ofatrial fibrillation with RVRat the request ofDr. Wynetta Emery.   Assessment & Plan    1. Atrial Fibrillation with RVR - initially diagnosed in 38/4665 and complicated by tachy-brady syndrome with her requiring PPM placement that admission. Presented with AMS and found to have a UTI and meet SIRS criteria.  - rates now in 90-low 100 bpm range for the most part and the patient is asymptomatic. She is on Toprol-XL 100mg  and oral Cardizem 30 mg every 6 hours.  Suspect her elevated rates are triggered by her acute illness.  Will transition to Cardizem CD 120 mg daily. Not a candidate for DCCV due to compliance issues with Eliquis.  - remains on Eliquis 2.5mg  BID given age greater than 62 and baseline creatinine typically greater than 1.5 (at 1.57 on 10/29  so will need to follow closely).  2. Tachy-brady Syndrome -s/p Medtronic PPM placement in 07/2019. Interrogation in 08/2019 showed episodes of AT/AF with rates into the 130's and intermittent compliance with Lopressor was reported. Plan for dose adjustment of AV nodal blocking agents as outlined above.  - followed by Dr. Lovena Le as an outpatient.   3. CAD - s/p CABG in 2006. NST in 06/2019 was low-risk showed no  significant reversible ischemia. She denies any recent chest pain. Continue BB and statin therapy. Not on ASA given the need for anticoagulation.  4. Chronic Diastolic CHF - CXR on admission showed mild pulmonary edema and right pleural effusion. Received 1L fluid bolus while in the ED due to SIRS criteria and elevated lactic acid. Internal Medicine has resumed oral Lasix 40 mg bid.  5. Aortic Stenosis - moderate stenosis by echo in 06/2019. Continue to follow as an outpatient.   6. Stage 3 CKD - peaked at 2.12 last admission, improved to 1.71 at discharge which is close to her baseline. Stable at 1.65 on admission and 1.57 on 10/29.    7. UTI - UA concerning for UTI and culture showed >100 k GNR. She has been started on Rocephin by the admitting team.     For questions or updates, please contact [place] Please consult [email or web address] for contact info under Cardiology/STEMI.      Signed, Kate Sable, MD  10/06/2019, 9:28 AM

## 2019-10-06 NOTE — Progress Notes (Signed)
Subjective: She says she feels okay.  Her speech is still difficult to understand.  She is scheduled for CT chest with contrast today but her renal function may not allow that.  Her creatinine did come down to 1.26 with GFR of 39 on the 28th but it is now 1.57 with GFR of 30   Objective: Vital signs in last 24 hours: Temp:  [97.5 F (36.4 C)-98.3 F (36.8 C)] 97.5 F (36.4 C) (10/30 0400) Pulse Rate:  [25-125] 105 (10/30 0500) Resp:  [20-27] 21 (10/30 0500) BP: (119-169)/(74-135) 152/116 (10/30 0509) SpO2:  [89 %-98 %] 94 % (10/30 0500) Weight change:     Intake/Output from previous day: 10/29 0701 - 10/30 0700 In: 127.5 [I.V.:27.5; IV Piggyback:100] Out: 1100 [Urine:1100]  PHYSICAL EXAM General appearance: alert, cooperative and no distress Resp: rhonchi bilaterally Cardio: regular rate and rhythm, S1, S2 normal, no murmur, click, rub or gallop GI: soft, non-tender; bowel sounds normal; no masses,  no organomegaly Extremities: extremities normal, atraumatic, no cyanosis or edema  Lab Results:  Results for orders placed or performed during the hospital encounter of 10/03/19 (from the past 48 hour(s))  Glucose, capillary     Status: Abnormal   Collection Time: 10/04/19  7:31 AM  Result Value Ref Range   Glucose-Capillary 68 (L) 70 - 99 mg/dL   Comment 1 Notify RN   Glucose, capillary     Status: Abnormal   Collection Time: 10/04/19 11:26 AM  Result Value Ref Range   Glucose-Capillary 117 (H) 70 - 99 mg/dL  Glucose, capillary     Status: Abnormal   Collection Time: 10/04/19  4:38 PM  Result Value Ref Range   Glucose-Capillary 141 (H) 70 - 99 mg/dL  Glucose, capillary     Status: None   Collection Time: 10/04/19  9:20 PM  Result Value Ref Range   Glucose-Capillary 77 70 - 99 mg/dL  Glucose, capillary     Status: Abnormal   Collection Time: 10/05/19  3:39 AM  Result Value Ref Range   Glucose-Capillary 131 (H) 70 - 99 mg/dL  Glucose, capillary     Status: Abnormal   Collection Time: 10/05/19  8:15 AM  Result Value Ref Range   Glucose-Capillary 142 (H) 70 - 99 mg/dL  Basic metabolic panel     Status: Abnormal   Collection Time: 10/05/19 10:15 AM  Result Value Ref Range   Sodium 139 135 - 145 mmol/L   Potassium 4.4 3.5 - 5.1 mmol/L    Comment: DELTA CHECK NOTED   Chloride 102 98 - 111 mmol/L   CO2 26 22 - 32 mmol/L   Glucose, Bld 301 (H) 70 - 99 mg/dL   BUN 37 (H) 8 - 23 mg/dL   Creatinine, Ser 1.54 (H) 0.44 - 1.00 mg/dL   Calcium 9.2 8.9 - 10.3 mg/dL   GFR calc non Af Amer 30 (L) >60 mL/min   GFR calc Af Amer 35 (L) >60 mL/min   Anion gap 11 5 - 15    Comment: Performed at [facility], [address] [place], [place] [address]  Glucose, capillary     Status: Abnormal   Collection Time: 10/05/19 11:42 AM  Result Value Ref Range   Glucose-Capillary 292 (H) 70 - 99 mg/dL  Glucose, capillary     Status: Abnormal   Collection Time: 10/05/19  4:53 PM  Result Value Ref Range   Glucose-Capillary 287 (H) 70 - 99 mg/dL  Glucose, capillary     Status: Abnormal  Collection Time: 10/05/19  9:12 PM  Result Value Ref Range   Glucose-Capillary 272 (H) 70 - 99 mg/dL   Comment 1 Notify RN    Comment 2 Document in Chart   Glucose, capillary     Status: Abnormal   Collection Time: 10/06/19  2:30 AM  Result Value Ref Range   Glucose-Capillary 180 (H) 70 - 99 mg/dL   Comment 1 Notify RN    Comment 2 Document in Chart   Basic metabolic panel     Status: Abnormal   Collection Time: 10/06/19  4:43 AM  Result Value Ref Range   Sodium 139 135 - 145 mmol/L   Potassium 4.1 3.5 - 5.1 mmol/L   Chloride 103 98 - 111 mmol/L   CO2 27 22 - 32 mmol/L   Glucose, Bld 142 (H) 70 - 99 mg/dL   BUN 38 (H) 8 - 23 mg/dL   Creatinine, Ser 1.57 (H) 0.44 - 1.00 mg/dL   Calcium 9.2 8.9 - 10.3 mg/dL   GFR calc non Af Amer 30 (L) >60 mL/min   GFR calc Af Amer 34 (L) >60 mL/min   Anion gap 9 5 - 15    Comment: Performed at [facility], [address], [place], [place]  [address]  CBC     Status: Abnormal   Collection Time: 10/06/19  4:43 AM  Result Value Ref Range   WBC 4.9 4.0 - 10.5 K/uL   RBC 4.09 3.87 - 5.11 MIL/uL   Hemoglobin 11.5 (L) 12.0 - 15.0 g/dL   HCT 37.7 36.0 - 46.0 %   MCV 92.2 80.0 - 100.0 fL   MCH 28.1 26.0 - 34.0 pg   MCHC 30.5 30.0 - 36.0 g/dL   RDW 16.0 (H) 11.5 - 15.5 %   Platelets 127 (L) 150 - 400 K/uL   nRBC 0.0 0.0 - 0.2 %    Comment: Performed at [facility], [address] [place], [place] [address]    ABGS No results for input(s): PHART, PO2ART, TCO2, HCO3 in the last 72 hours.  Invalid input(s): PCO2 CULTURES Recent Results (from the past 240 hour(s))  Urine culture     Status: Abnormal   Collection Time: 10/03/19 12:05 PM   Specimen: Urine, Catheterized  Result Value Ref Range Status   Specimen Description   Final    URINE, CATHETERIZED Performed at [facility], [address], [place], [place] [address]    Special Requests   Final    NONE Performed at [facility], [address], [place], [place] [address]    Culture >=100,000 COLONIES/mL ESCHERICHIA COLI (A)  Final   Report Status 10/05/2019 FINAL  Final   Organism ID, Bacteria ESCHERICHIA COLI (A)  Final      Susceptibility   Escherichia coli - MIC*    AMPICILLIN <=2 SENSITIVE Sensitive     CEFAZOLIN <=4 SENSITIVE Sensitive     CEFTRIAXONE <=1 SENSITIVE Sensitive     CIPROFLOXACIN <=0.25 SENSITIVE Sensitive     GENTAMICIN <=1 SENSITIVE Sensitive     IMIPENEM <=0.25 SENSITIVE Sensitive     NITROFURANTOIN <=16 SENSITIVE Sensitive     TRIMETH/SULFA <=20 SENSITIVE Sensitive     AMPICILLIN/SULBACTAM <=2 SENSITIVE Sensitive     PIP/TAZO <=4 SENSITIVE Sensitive     Extended ESBL NEGATIVE Sensitive     * >=100,000 COLONIES/mL ESCHERICHIA COLI  SARS CORONAVIRUS 2 (TAT 6-24 HRS) Nasopharyngeal Nasopharyngeal Swab     Status: None   Collection Time: 10/03/19  1:59 PM  Specimen: Nasopharyngeal Swab  Result Value Ref Range Status   SARS Coronavirus 2  NEGATIVE NEGATIVE Final    Comment: (NOTE) SARS-CoV-2 target nucleic acids are NOT DETECTED. The SARS-CoV-2 RNA is generally detectable in upper and lower respiratory specimens during the acute phase of infection. Negative results do not preclude SARS-CoV-2 infection, do not rule out co-infections with other pathogens, and should not be used as the sole basis for treatment or other patient management decisions. Negative results must be combined with clinical observations, patient history, and epidemiological information. The expected result is Negative. Fact Sheet for Patients: SugarRoll.be Fact Sheet for Healthcare Providers: https://www.woods-mathews.com/ This test is not yet approved or cleared by the [place] FDA and  has been authorized for detection and/or diagnosis of SARS-CoV-2 by FDA under an Emergency Use Authorization (EUA). This EUA will remain  in effect (meaning this test can be used) for the duration of the COVID-19 declaration under Section 56 4(b)(1) of the Act, 21 U.S.C. section 360bbb-3(b)(1), unless the authorization is terminated or revoked sooner. Performed at [facility], [place] [address] [place], [place] [address]   Culture, blood (routine x 2)     Status: None (Preliminary result)   Collection Time: 10/03/19  5:13 PM   Specimen: Right Antecubital; Blood  Result Value Ref Range Status   Specimen Description RIGHT ANTECUBITAL  Final   Special Requests   Final    BOTTLES DRAWN AEROBIC AND ANAEROBIC Blood Culture adequate volume   Culture   Final    NO GROWTH 3 DAYS Performed at [facility], [address], [place], [place] [address]    Report Status PENDING  Incomplete  Culture, blood (routine x 2)     Status: None (Preliminary result)   Collection Time: 10/03/19  5:13 PM   Specimen: BLOOD RIGHT HAND  Result Value Ref Range Status   Specimen Description BLOOD RIGHT HAND  Final   Special Requests    Final    BOTTLES DRAWN AEROBIC AND ANAEROBIC Blood Culture adequate volume   Culture   Final    NO GROWTH 3 DAYS Performed at [facility], [address], [place], [place] [address]    Report Status PENDING  Incomplete  MRSA PCR Screening     Status: None   Collection Time: 10/03/19  8:30 PM   Specimen: Nasal Mucosa; Nasopharyngeal  Result Value Ref Range Status   MRSA by PCR NEGATIVE NEGATIVE Final    Comment:        The GeneXpert MRSA Assay (FDA approved for NASAL specimens only), is one component of a comprehensive MRSA colonization surveillance program. It is not intended to diagnose MRSA infection nor to guide or monitor treatment for MRSA infections. Performed at [facility], [address], [place], [place] [address]    Studies/Results: No results found.  Medications:  Prior to Admission:  Medications Prior to Admission  Medication Sig Dispense Refill Last Dose  . albuterol (PROVENTIL HFA;VENTOLIN HFA) 108 (90 Base) MCG/ACT inhaler Inhale 2 puffs into the lungs every 6 (six) hours as needed for wheezing or shortness of breath. 1 Inhaler 5   . apixaban (ELIQUIS) 2.5 MG TABS tablet Take 1 tablet (2.5 mg total) by mouth 2 (two) times daily. 60 tablet 11 10/02/2019 at 2100  . atorvastatin (LIPITOR) 20 MG tablet Take 1 tablet (20 mg total) by mouth at bedtime. 90 tablet 3 10/02/2019 at Unknown time  . Carboxymethylcellul-Glycerin (CLEAR EYES FOR DRY EYES) 1-0.25 % SOLN Place 1 drop  into both eyes daily as needed (irritation).      . clotrimazole-betamethasone (LOTRISONE) cream Apply 1 application topically 2 (two) times daily. 30 g 0   . diltiazem (CARDIZEM) 30 MG tablet Take 1 tablet (30 mg total) by mouth every 8 (eight) hours. 90 tablet 1 10/02/2019 at Unknown time  . docusate sodium (COLACE) 100 MG capsule Take 1 capsule (100 mg total) by mouth 2 (two) times daily. 60 capsule 0 10/02/2019 at Unknown time  . esomeprazole (NEXIUM) 40 MG capsule TAKE 1 CAPSULE BY MOUTH ONCE  DAILY. 30 capsule 0 10/02/2019 at Unknown time  . ferrous sulfate 324 (65 Fe) MG TBEC Take 324 mg by mouth every morning.    10/02/2019 at Unknown time  . furosemide (LASIX) 40 MG tablet Take 2 tablets (80 mg total) by mouth daily. (Patient taking differently: Take 40 mg by mouth 2 (two) times daily. ) 60 tablet 6 10/02/2019 at Unknown time  . Insulin Detemir (LEVEMIR FLEXTOUCH) 100 UNIT/ML Pen INJECT 24 UNITS EACH MORNING 9 mL 2 10/02/2019 at Unknown time  . insulin lispro (HUMALOG) 100 UNIT/ML injection Check glucose before each meal (TID). If over 200 take 5 units. If over 300 take 8 units. If over 400 take 10 units. 10 mL 11 10/02/2019 at Unknown time  . magnesium hydroxide (MILK OF MAGNESIA) 400 MG/5ML suspension Take 30 mLs by mouth daily as needed for moderate constipation.     . metoprolol succinate (TOPROL-XL) 100 MG 24 hr tablet Take 1 tablet (100 mg total) by mouth daily. For blood pressure control 30 tablet 1 10/02/2019 at 900  . Multiple Vitamins-Iron (DAILY VITAMINS/IRON/BETA CAROT PO) Take 1 tablet by mouth at bedtime.   10/02/2019 at Unknown time  . nitroGLYCERIN (NITROSTAT) 0.4 MG SL tablet PLACE 1 TAB UNDER TONGUE EVERY 3 MINUTES AS DIRECTED UP TO 3 TIMES ASNEEDED FOR CHEST PAIN. 25 tablet 0   . PARoxetine (PAXIL) 20 MG tablet TAKE 1 TABLET BY MOUTH EVERY MORNING. 30 tablet 0 10/02/2019 at Unknown time  . polyethylene glycol (MIRALAX) 17 g packet Take 17 g by mouth 2 (two) times daily. 100 each 1 10/02/2019 at Unknown time  . ramipril (ALTACE) 5 MG capsule Take 1 capsule (5 mg total) by mouth daily. 90 capsule 3 10/02/2019 at Unknown time   Scheduled: . apixaban  2.5 mg Oral BID  . atorvastatin  20 mg Oral QHS  . Chlorhexidine Gluconate Cloth  6 each Topical Daily  . diltiazem  30 mg Oral Q6H  . furosemide  40 mg Oral BID  . hydrocortisone  25 mg Rectal BID  . insulin aspart  0-9 Units Subcutaneous TID WC  . insulin aspart  4 Units Subcutaneous TID WC  . insulin detemir  16  Units Subcutaneous Q2200  . metoprolol succinate  100 mg Oral Daily  . pantoprazole  40 mg Oral Daily  . PARoxetine  20 mg Oral q morning - 10a  . polyethylene glycol  17 g Oral Daily  . ramipril  5 mg Oral Daily  . senna  1 tablet Oral BID   Continuous: . cefTRIAXone (ROCEPHIN)  IV Stopped (10/05/19 1005)   NWG:[id number], ondansetron **OR** ondansetron (ZOFRAN) IV, traMADol  Assesment: She was admitted with metabolic encephalopathy and sepsis.  She is resolved sepsis pathophysiology.  She has abnormality on chest x-ray and on CT previously it was unclear whether she had a mass or whether she had pneumonia.  He was planned to have CT with contrast today but  I do not think her renal function is going to allow that Active Problems:   Metabolic encephalopathy   Altered mental status   Atrial fibrillation with RVR (HCC)   Sepsis ([place])   Advanced care planning/counseling discussion   Goals of care, counseling/discussion   Palliative care by specialist   Hemorrhoids    Plan: will discuss with Dr. Wynetta Emery.  Possibilities would be to do noncontrasted CT or see if we can hydrate her for 24 hours look at her creatinine level and if it is adequate then do CT with contrast    LOS: 3 days   [name] 10/06/2019, 7:04 AM

## 2019-10-06 NOTE — Care Management Important Message (Signed)
Important Message  Patient Details  Name: [name] MRN: [id number] Date of Birth: May 04, 1931   Medicare Important Message Given:  Yes     Tommy Medal 10/06/2019, 4:24 PM

## 2019-10-06 NOTE — Progress Notes (Signed)
Pt continuing to have elevated BP. Last dose of cardizem was at 2348. Had one DBP under 100 since po dose. Looking at trends, pt BP has been elevated for a while. Paged MD to inquire about IV push to get BP down. HR remains  90-100 bpm.  Verbal order for Metoprolol 5 mg IV push x 1 dose

## 2019-10-07 DIAGNOSIS — R4182 Altered mental status, unspecified: Secondary | ICD-10-CM | POA: Diagnosis not present

## 2019-10-07 DIAGNOSIS — G9341 Metabolic encephalopathy: Secondary | ICD-10-CM | POA: Diagnosis not present

## 2019-10-07 DIAGNOSIS — I4891 Unspecified atrial fibrillation: Secondary | ICD-10-CM | POA: Diagnosis not present

## 2019-10-07 DIAGNOSIS — K649 Unspecified hemorrhoids: Secondary | ICD-10-CM | POA: Diagnosis not present

## 2019-10-07 LAB — BASIC METABOLIC PANEL
Anion gap: 8 (ref 5–15)
BUN: 35 mg/dL — ABNORMAL HIGH (ref 8–23)
CO2: 31 mmol/L (ref 22–32)
Calcium: 9 mg/dL (ref 8.9–10.3)
Chloride: 100 mmol/L (ref 98–111)
Creatinine, Ser: 1.41 mg/dL — ABNORMAL HIGH (ref 0.44–1.00)
GFR calc Af Amer: 39 mL/min — ABNORMAL LOW (ref >60)
GFR calc non Af Amer: 34 mL/min — ABNORMAL LOW (ref >60)
Glucose, Bld: 143 mg/dL — ABNORMAL HIGH (ref 70–99)
Potassium: 3.9 mmol/L (ref 3.5–5.1)
Sodium: 139 mmol/L (ref 135–145)

## 2019-10-07 LAB — GLUCOSE, CAPILLARY
Glucose-Capillary: 98 mg/dL (ref 70–99)
Glucose-Capillary: 130 mg/dL — ABNORMAL HIGH (ref 70–99)
Glucose-Capillary: 105 mg/dL — ABNORMAL HIGH (ref 70–99)
Glucose-Capillary: 87 mg/dL (ref 70–99)

## 2019-10-07 MED ORDER — DILTIAZEM HCL ER COATED BEADS 120 MG PO CP24: 120.0000 mg | ORAL_CAPSULE | Freq: Every day | ORAL | 1 refills | Status: DC

## 2019-10-07 MED ORDER — LABETALOL HCL 5 MG/ML IV SOLN
10.0000 mg | INTRAVENOUS | Status: DC | PRN
  Administered 2019-10-07: 10 mg via INTRAVENOUS
  Filled 2019-10-07: qty 4

## 2019-10-07 MED ORDER — LORAZEPAM 2 MG/ML IJ SOLN
INTRAMUSCULAR | Status: AC
  Administered 2019-10-07: 0.5 mg via INTRAVENOUS
  Filled 2019-10-07: qty 1

## 2019-10-07 MED ORDER — LORAZEPAM 2 MG/ML IJ SOLN
0.5000 mg | Freq: Once | INTRAMUSCULAR | Status: AC | PRN
  Administered 2019-10-07: 10:00:00 0.5 mg via INTRAVENOUS

## 2019-10-07 MED ORDER — LEVEMIR FLEXTOUCH 100 UNIT/ML ~~LOC~~ SOPN: PEN_INJECTOR | SUBCUTANEOUS | 2 refills | Status: DC

## 2019-10-07 NOTE — Progress Notes (Signed)
Pt's IV and monitoring removed. Discussed medication changes, d/c instructions, and follow up care. Pt's daughter denied any questions or concerns with pt or care at this time. Stated her sister and brother would help care for pt at home. Pt assisted into WC to lobby for d/c and assisted into vehicle.

## 2019-10-07 NOTE — Discharge Summary (Signed)
Physician Discharge Summary  CECILLIA MENEES IHW:[id number] DOB: 1931-01-25 DOA: 10/03/2019  PCP: [name], MD Cardiologist: Hochrein   Admit date: 10/03/2019 Discharge date: 10/07/2019  Admitted From: Home Disposition: Home with [facility] (declined SNF)  Recommendations for Outpatient Follow-up:  1. Follow up with PCP in 1 weeks 2. Please establish care with [place] pulmonology to follow up lung nodule 3. Consider outpatient PET scan and / or CT chest with contrast if renal function permits 4. Please follow up with cardiology in 2 weeks   Home Health: PT, RN, SW  Discharge Condition: STABLE   CODE STATUS: DNR    Brief Hospitalization Summary: Please see all hospital notes, images, labs for full details of the hospitalization. Dr. Talmadge Coventry HPI: HPI: [name] is a 85 y.o. female with medical history significant for diabetes mellitus, hypertension, depression, diastolic CHF, atrial fibrillation, coronary artery disease, CVA.  History is limited as patient has baseline slurred speech, and acute confusion.  She answers a few questions appropriately on review of systems, unable to give me details of the events of today, and keeps calling her daughter's name Margaretha Sheffield. Most of the history is obtained from chart review and from daughter- [name] on the phone.  Daughter reports patient has been confused for the past 2 days, like patient standing in front of the bathroom and not knowing what the pattern is.  No fevers.  Patient denies difficulty breathing or cough.  She denies vomiting no abdominal pain. No COVID contacts.  Daughter reports chronic lower extremity swelling that has somewhat improved.  No vomiting, no loose stools.  She has been compliant with Lasix and home insulins.  She reports good oral intake.  Recent hospitalization 10/16-10/20 for decompensated CHF.  ED Course: On my evaluation patient is tachycardic to 140s.  Blood pressure systolic up to 491P- 915A.  O2 sats greater  93% on room air.  WBC 6.2.  Creatinine at baseline 1.65.  UA few bacteria moderate leukocytes 21-50 WBC.  Glucose 483.  EKG shows atrial fibrillation.  Patient was given 1 L bolus in ED, IV ceftriaxone.   Brief Admission Hx: 85 year old female with diastolic CHF, hypertension, diabetes mellitus, chronic atrial fibrillation, CAD, CVA presented with acute confusion.  Patient was in A. fib RVR.  Patient was noted to have a UTI and was dehydrated.  She had been recently hospitalized 10/16 through 10/20 for decompensated CHF.  MDM/Assessment & Plan:   1. Acute metabolic encephalopathy-resolve with treatments although she has underlying dementia and easily agitated.   secondary to UTI and dehydration.  Pt seems to be at baseline now and stable to DC.  Daughter refuses SNF.  Home with [facility] services.  2. UTI-Treated with ceftriaxone IV daily and completed course.  3. Mild dehydration-patient was hydrated with IV fluids which will be discontinued now. She is eating and drinking better now.  4. A. fib with RVR-Pt was seen by inpatient cardiology service and now on cardizem 120 mg daily,  continued metoprolol ER 100 mg.  HR controlled now.    5. Diastolic CHF-chronic pulmonary edema -  Resumed home Lasix. She is net negative 3.5L and euvolemic now.  Feeling better.   6. Essential hypertension-she has been resumed on home metoprolol.  BP much better controlled.  7. History of tachybradycardia syndrome-she is status post pacemaker 07/24/2019. 8. Hypoglycemia-I have reduced her basal insulin doses.  Monitor CBG closely at home.   DVT prophylaxis: Apixaban Code Status: Full Family Communication: Daughter Disposition Plan: Home with [facility]  services  Consultants:  N/A  Procedures:  N/A  Antimicrobials:  Ceftriaxone 10/03/2019-10/30  Discharge Diagnoses:  Active Problems:   Metabolic encephalopathy   Altered mental status   Atrial fibrillation with RVR ([place])   Sepsis ([place])   Advanced care  planning/counseling discussion   Goals of care, counseling/discussion   Palliative care by specialist   Hemorrhoids  Discharge Instructions:  Allergies as of 10/07/2019      Reactions   Advil [ibuprofen] Swelling   Heparin Other (See Comments)   Confusion, hallucinations.    Propoxyphene N-acetaminophen Other (See Comments)   Numbness all over. "floating" sensation.   Acetaminophen Rash      Medication List    STOP taking these medications   diltiazem 30 MG tablet Commonly known as: CARDIZEM     TAKE these medications   albuterol 108 (90 Base) MCG/ACT inhaler Commonly known as: VENTOLIN HFA Inhale 2 puffs into the lungs every 6 (six) hours as needed for wheezing or shortness of breath.   apixaban 2.5 MG Tabs tablet Commonly known as: ELIQUIS Take 1 tablet (2.5 mg total) by mouth 2 (two) times daily.   atorvastatin 20 MG tablet Commonly known as: LIPITOR Take 1 tablet (20 mg total) by mouth at bedtime.   Clear Eyes for Dry Eyes 1-0.25 % Soln Generic drug: Carboxymethylcellul-Glycerin Place 1 drop into both eyes daily as needed (irritation).   clotrimazole-betamethasone cream Commonly known as: LOTRISONE Apply 1 application topically 2 (two) times daily.   DAILY VITAMINS/IRON/BETA CAROT PO Take 1 tablet by mouth at bedtime.   diltiazem 120 MG 24 hr capsule Commonly known as: CARDIZEM CD Take 1 capsule (120 mg total) by mouth daily.   docusate sodium 100 MG capsule Commonly known as: COLACE Take 1 capsule (100 mg total) by mouth 2 (two) times daily.   esomeprazole 40 MG capsule Commonly known as: NEXIUM TAKE 1 CAPSULE BY MOUTH ONCE DAILY.   ferrous sulfate 324 (65 Fe) MG Tbec Take 324 mg by mouth every morning.   furosemide 40 MG tablet Commonly known as: LASIX Take 2 tablets (80 mg total) by mouth daily. What changed:   how much to take  when to take this   insulin lispro 100 UNIT/ML injection Commonly known as: HUMALOG Check glucose before each  meal (TID). If over 200 take 5 units. If over 300 take 8 units. If over 400 take 10 units.   Levemir FlexTouch 100 UNIT/ML Pen Generic drug: Insulin Detemir INJECT 16 UNITS daily What changed: additional instructions   magnesium hydroxide 400 MG/5ML suspension Commonly known as: MILK OF MAGNESIA Take 30 mLs by mouth daily as needed for moderate constipation.   metoprolol succinate 100 MG 24 hr tablet Commonly known as: TOPROL-XL Take 1 tablet (100 mg total) by mouth daily. For blood pressure control   nitroGLYCERIN 0.4 MG SL tablet Commonly known as: NITROSTAT PLACE 1 TAB UNDER TONGUE EVERY 3 MINUTES AS DIRECTED UP TO 3 TIMES ASNEEDED FOR CHEST PAIN.   PARoxetine 20 MG tablet Commonly known as: PAXIL TAKE 1 TABLET BY MOUTH EVERY MORNING.   polyethylene glycol 17 g packet Commonly known as: MiraLax Take 17 g by mouth 2 (two) times daily.   ramipril 5 MG capsule Commonly known as: ALTACE Take 1 capsule (5 mg total) by mouth daily.      Follow-up Information    Stacks, Cletus Gash, MD. Schedule an appointment as soon as possible for a visit in 1 week(s).   Specialty: Family Medicine Contact information:  [place] [address] [phone]        Minus Breeding, MD. Schedule an appointment as soon as possible for a visit in 2 week(s).   Specialty: Cardiology Why: Hospital Follow Up for Afib RVR Contact information: [place] [address] [phone]        [facility]. Schedule an appointment as soon as possible for a visit in 1 month(s).   Specialty: Pulmonology Why: Establish Care regarding lung nodule follow up  Contact information: [place]  [phone] [phone]         Allergies  Allergen Reactions  . Advil [Ibuprofen] Swelling  . Heparin Other (See Comments)    Confusion, hallucinations.   . Propoxyphene N-Acetaminophen Other (See Comments)    Numbness all over.  "floating" sensation.  . Acetaminophen Rash   Allergies as of 10/07/2019      Reactions   Advil [ibuprofen] Swelling   Heparin Other (See Comments)   Confusion, hallucinations.    Propoxyphene N-acetaminophen Other (See Comments)   Numbness all over. "floating" sensation.   Acetaminophen Rash      Medication List    STOP taking these medications   diltiazem 30 MG tablet Commonly known as: CARDIZEM     TAKE these medications   albuterol 108 (90 Base) MCG/ACT inhaler Commonly known as: VENTOLIN HFA Inhale 2 puffs into the lungs every 6 (six) hours as needed for wheezing or shortness of breath.   apixaban 2.5 MG Tabs tablet Commonly known as: ELIQUIS Take 1 tablet (2.5 mg total) by mouth 2 (two) times daily.   atorvastatin 20 MG tablet Commonly known as: LIPITOR Take 1 tablet (20 mg total) by mouth at bedtime.   Clear Eyes for Dry Eyes 1-0.25 % Soln Generic drug: Carboxymethylcellul-Glycerin Place 1 drop into both eyes daily as needed (irritation).   clotrimazole-betamethasone cream Commonly known as: LOTRISONE Apply 1 application topically 2 (two) times daily.   DAILY VITAMINS/IRON/BETA CAROT PO Take 1 tablet by mouth at bedtime.   diltiazem 120 MG 24 hr capsule Commonly known as: CARDIZEM CD Take 1 capsule (120 mg total) by mouth daily.   docusate sodium 100 MG capsule Commonly known as: COLACE Take 1 capsule (100 mg total) by mouth 2 (two) times daily.   esomeprazole 40 MG capsule Commonly known as: NEXIUM TAKE 1 CAPSULE BY MOUTH ONCE DAILY.   ferrous sulfate 324 (65 Fe) MG Tbec Take 324 mg by mouth every morning.   furosemide 40 MG tablet Commonly known as: LASIX Take 2 tablets (80 mg total) by mouth daily. What changed:   how much to take  when to take this   insulin lispro 100 UNIT/ML injection Commonly known as: HUMALOG Check glucose before each meal (TID). If over 200 take 5 units. If over 300 take 8 units. If over 400 take 10 units.    Levemir FlexTouch 100 UNIT/ML Pen Generic drug: Insulin Detemir INJECT 16 UNITS daily What changed: additional instructions   magnesium hydroxide 400 MG/5ML suspension Commonly known as: MILK OF MAGNESIA Take 30 mLs by mouth daily as needed for moderate constipation.   metoprolol succinate 100 MG 24 hr tablet Commonly known as: TOPROL-XL Take 1 tablet (100 mg total) by mouth daily. For blood pressure control   nitroGLYCERIN 0.4 MG SL tablet Commonly known as: NITROSTAT PLACE 1 TAB UNDER TONGUE EVERY 3 MINUTES AS DIRECTED UP TO 3 TIMES ASNEEDED FOR CHEST PAIN.   PARoxetine  20 MG tablet Commonly known as: PAXIL TAKE 1 TABLET BY MOUTH EVERY MORNING.   polyethylene glycol 17 g packet Commonly known as: MiraLax Take 17 g by mouth 2 (two) times daily.   ramipril 5 MG capsule Commonly known as: ALTACE Take 1 capsule (5 mg total) by mouth daily.       Procedures/Studies: Dg Chest 2 View  Result Date: 09/27/2019 CLINICAL DATA:  85 year old female with history of pleural effusion. EXAM: CHEST - 2 VIEW COMPARISON:  Chest x-ray 09/26/2019. FINDINGS: Lung volumes are low. Moderate right pleural effusion. Atelectasis and/or consolidation in the base of the right lung. Left lung is clear. No left pleural effusion. No evidence of pulmonary edema. Heart size is normal. Upper mediastinal contours are within normal limits. Aortic atherosclerosis. Status post median sternotomy for CABG. Left-sided pacemaker device in place with lead tips projecting over the expected location of the right atrium and right ventricle. IMPRESSION: 1. Moderate right pleural effusion with atelectasis and/or consolidation. 2. Aortic atherosclerosis. Electronically Signed   By: Vinnie Langton M.D.   On: 09/27/2019 12:55   Dg Chest 2 View  Result Date: 09/26/2019 CLINICAL DATA:  Shortness of breath EXAM: CHEST - 2 VIEW COMPARISON:  09/22/2019 FINDINGS: Left pacer remains in place, unchanged. Prior CABG. Heart is  normal size. Small right pleural effusion with right lower lobe atelectasis or infiltrate. Left lung clear. No acute bony abnormality. IMPRESSION: Right lower lobe atelectasis or infiltrate with small right effusion. Findings similar to prior study. Electronically Signed   By: Rolm Baptise M.D.   On: 09/26/2019 09:48   Dg Chest 2 View  Result Date: 09/15/2019 CLINICAL DATA:  Congestive heart failure, shortness of breath EXAM: CHEST - 2 VIEW COMPARISON:  09/05/2019 FINDINGS: Post CABG changes. Left-sided implanted cardiac device. Heart size is stable. Calcific aortic knob. Moderate right pleural effusion and small left pleural effusion. Patchy right basilar opacity. Decrease in the degree of pulmonary vascular congestion compared to prior. No pneumothorax. Severe compression fracture of the L1 vertebral body, unchanged. IMPRESSION: Moderate right and small left pleural effusions with patchy right basilar opacity. Findings better characterized on same-day CT. Electronically Signed   By: Davina Poke M.D.   On: 09/15/2019 14:27   Ct Head Wo Contrast  Result Date: 10/03/2019 CLINICAL DATA:  Slurred speech and confusion. EXAM: CT HEAD WITHOUT CONTRAST TECHNIQUE: Contiguous axial images were obtained from the base of the skull through the vertex without intravenous contrast. COMPARISON:  09/05/2019 FINDINGS: Brain: No discernible change since then. Generalized brain atrophy. Old cerebellar infarctions on the right. Old right occipital infarction. Extensive chronic small-vessel ischemic changes throughout the brain. No sign of acute or subacute infarction, mass lesion, hemorrhage, hydrocephalus or extra-axial collection. Vascular: There is atherosclerotic calcification of the major vessels at the base of the brain. Skull: Negative Sinuses/Orbits: Mucosal inflammatory changes of the maxillary sinuses. Previous functional endoscopic sinus surgery. Orbits negative. Other: None IMPRESSION: No acute finding or  change since the study of 09/05/2019. Atrophy and extensive chronic small-vessel ischemic changes. 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 Electronically Signed   By: Nelson Chimes M.D.   On: 10/03/2019 19:06   Ct Chest Wo Contrast  Result Date: 10/06/2019 CLINICAL DATA:  Pneumonia, shortness of breath, chest pain EXAM: CT CHEST WITHOUT CONTRAST TECHNIQUE: Multidetector CT imaging of the chest was performed following the standard protocol without IV contrast. COMPARISON:  09/15/2019 FINDINGS: Cardiovascular: Calcific atherosclerosis of the aorta and major branch vessels. Limited without IV contrast.  No large aneurysm. Native coronary atherosclerosis noted. Previous median sternotomy present. Heart is enlarged. No pericardial effusion. Pacer wires noted in the right heart. Mediastinum/Nodes: Inferior thyroid dystrophic calcifications noted thyroid normal in size. Trachea and central airways are patent. Esophagus is nondilated. No large hiatal hernia. No abnormal lymph nodes are bulky adenopathy. Lungs/Pleura: Enlarging pleural effusions bilaterally, worse on the right. Worsening bilateral lower lobe collapse/consolidation. Difficult to exclude underlying obscured lower lobe mass or other abnormality. Slight increased lingula and right middle lobe atelectasis as well. No pneumothorax. Upper Abdomen: Calcified hepatic granuloma noted in the left lobe. Artifact from the overlying arms. Spleen is normal in size. Abdominal atherosclerosis noted. Left kidney demonstrates no hydronephrosis. No definite acute finding in the upper abdomen. Musculoskeletal: Degenerative changes of the spine. Increased body anasarca of the lower chest. Chronic L2 compression fracture. IMPRESSION: Enlarging pleural effusions with worsening bibasilar collapse/consolidation, more pronounced on the right. Aortic Atherosclerosis (ICD10-I70.0). Electronically Signed   By: Jerilynn Mages.  Shick M.D.   On: 10/06/2019 14:34   Ct Chest Wo  Contrast  Result Date: 09/15/2019 CLINICAL DATA:  Shortness of breath, pleural effusion EXAM: CT CHEST WITHOUT CONTRAST TECHNIQUE: Multidetector CT imaging of the chest was performed following the standard protocol without IV contrast. COMPARISON:  Same day chest radiograph FINDINGS: Car Cardiovascular: Aortic atherosclerosis. Normal heart size. Extensive 3 vessel coronary artery calcifications and/or stents status post median sternotomy and CABG. Aortic valve calcifications. Dense mitral annulus calcifications. No pericardial effusion. Left chest multi lead pacer. Mediastinum/Nodes: Prominent mediastinal lymph nodes without pathologic enlargement. Thyroid gland, trachea, and esophagus demonstrate no significant findings. Lungs/Pleura: Moderate to large right pleural effusion with associated atelectasis or consolidation. Small left pleural effusion. There is a dense, rounded consolidation of the medial, infrahilar right lower lobe measuring 3.7 x 3.3 cm, suspicious for mass although difficult to distinguish from adjacent pleural fluid (series 4, image 108). Upper Abdomen:  Trace perihepatic ascites in the upper abdomen. Musculoskeletal: Disc degenerative disease and ankylosis of the thoracic spine. High-grade wedge deformity of L1. IMPRESSION: 1. Moderate to large right pleural effusion with associated atelectasis or consolidation. Small left pleural effusion. 2. There is a dense, rounded consolidation of the medial, infrahilar right lower lobe measuring 3.7 x 3.3 cm, suspicious for mass although difficult to distinguish from adjacent pleural fluid (series 4, image 108). Recommend contrast enhanced CT to more clearly evaluate. 3.  Trace perihepatic ascites in the upper abdomen. 4.  Coronary artery disease.  Aortic Atherosclerosis (ICD10-I70.0). Electronically Signed   By: Eddie Candle M.D.   On: 09/15/2019 12:09   Dg Chest Port 1 View  Result Date: 10/03/2019 CLINICAL DATA:  Altered mental status for 2 days  EXAM: PORTABLE CHEST 1 VIEW COMPARISON:  September 27, 2019 FINDINGS: The mediastinal contour and cardiac silhouette are stable. Cardiac pacemaker is unchanged. There is pulmonary edema. Patchy consolidation with right pleural effusion is identified in the right lung base. The bony structures are stable. IMPRESSION: Mild pulmonary edema. Right pleural effusion. Patchy consolidation of right lung base, suspicious for pneumonia. Electronically Signed   By: Abelardo Diesel M.D.   On: 10/03/2019 16:47   Dg Chest Port 1 View  Result Date: 09/22/2019 CLINICAL DATA:  85 year old female with shortness of breath. EXAM: PORTABLE CHEST 1 VIEW COMPARISON:  Chest radiograph and CT dated 09/15/2019 FINDINGS: Moderate right pleural effusion similar to prior radiograph and CT. Right lung base density may represent atelectasis or infiltrate. Clinical correlation is recommended. There is no pneumothorax. There is mild cardiomegaly  with vascular congestion, new since the prior radiograph. Median sternotomy wires and left pectoral pacemaker device. No acute osseous pathology. Osteopenia. IMPRESSION: 1. Stable moderate right pleural effusion and associated right lung base atelectasis or infiltrate. 2. Cardiomegaly. Mild vascular congestion, new since the prior radiograph. Electronically Signed   By: Anner Crete M.D.   On: 09/22/2019 23:28     Subjective: Pt is eating and drinking well, she has no complaints.  No chest pain and no sOB.  No palpitations  Discharge Exam: Vitals:   10/07/19 0821 10/07/19 0845  BP:    Pulse:  (!) 131  Resp:  (!) 22  Temp:    SpO2: 100% 92%   Vitals:   10/07/19 0758 10/07/19 0815 10/07/19 0821 10/07/19 0845  BP:  (!) 150/78    Pulse:  70  (!) 131  Resp:  20  (!) 22  Temp:      TempSrc:      SpO2: 100% 100% 100% 92%  Weight:      Height:       General exam: Elderly female awake and alert in no distress cooperative. Respiratory system: Clear. No increased work of  breathing. Cardiovascular system: Irregularly irregular S1 & S2 heard. No JVD, murmurs, gallops, clicks or pedal edema. Gastrointestinal system: Abdomen is nondistended, soft and nontender. Normal bowel sounds heard. Central nervous system: Alert and oriented. No focal neurological deficits. Extremities: no pretibial edema.   The results of significant diagnostics from this hospitalization (including imaging, microbiology, ancillary and laboratory) are listed below for reference.     Microbiology: Recent Results (from the past 240 hour(s))  Urine culture     Status: Abnormal   Collection Time: 10/03/19 12:05 PM   Specimen: Urine, Catheterized  Result Value Ref Range Status   Specimen Description   Final    URINE, CATHETERIZED Performed at [facility], [address], [place], [place] [address]    Special Requests   Final    NONE Performed at [facility], [address], [place], [place] [address]    Culture >=100,000 COLONIES/mL ESCHERICHIA COLI (A)  Final   Report Status 10/05/2019 FINAL  Final   Organism ID, Bacteria ESCHERICHIA COLI (A)  Final      Susceptibility   Escherichia coli - MIC*    AMPICILLIN <=2 SENSITIVE Sensitive     CEFAZOLIN <=4 SENSITIVE Sensitive     CEFTRIAXONE <=1 SENSITIVE Sensitive     CIPROFLOXACIN <=0.25 SENSITIVE Sensitive     GENTAMICIN <=1 SENSITIVE Sensitive     IMIPENEM <=0.25 SENSITIVE Sensitive     NITROFURANTOIN <=16 SENSITIVE Sensitive     TRIMETH/SULFA <=20 SENSITIVE Sensitive     AMPICILLIN/SULBACTAM <=2 SENSITIVE Sensitive     PIP/TAZO <=4 SENSITIVE Sensitive     Extended ESBL NEGATIVE Sensitive     * >=100,000 COLONIES/mL ESCHERICHIA COLI  SARS CORONAVIRUS 2 (TAT 6-24 HRS) Nasopharyngeal Nasopharyngeal Swab     Status: None   Collection Time: 10/03/19  1:59 PM   Specimen: Nasopharyngeal Swab  Result Value Ref Range Status   SARS Coronavirus 2 NEGATIVE NEGATIVE Final    Comment: (NOTE) SARS-CoV-2 target nucleic acids are NOT  DETECTED. The SARS-CoV-2 RNA is generally detectable in upper and lower respiratory specimens during the acute phase of infection. Negative results do not preclude SARS-CoV-2 infection, do not rule out co-infections with other pathogens, and should not be used as the sole basis for treatment or other patient management decisions. Negative results must be combined with clinical  observations, patient history, and epidemiological information. The expected result is Negative. Fact Sheet for Patients: SugarRoll.be Fact Sheet for Healthcare Providers: https://www.woods-mathews.com/ This test is not yet approved or cleared by the [place] FDA and  has been authorized for detection and/or diagnosis of SARS-CoV-2 by FDA under an Emergency Use Authorization (EUA). This EUA will remain  in effect (meaning this test can be used) for the duration of the COVID-19 declaration under Section 56 4(b)(1) of the Act, 21 U.S.C. section 360bbb-3(b)(1), unless the authorization is terminated or revoked sooner. Performed at [name], [place] [address] [place], [place] [address]   Culture, blood (routine x 2)     Status: None (Preliminary result)   Collection Time: 10/03/19  5:13 PM   Specimen: Right Antecubital; Blood  Result Value Ref Range Status   Specimen Description RIGHT ANTECUBITAL  Final   Special Requests   Final    BOTTLES DRAWN AEROBIC AND ANAEROBIC Blood Culture adequate volume   Culture   Final    NO GROWTH 4 DAYS Performed at [facility], [address] [place], [place] [address]    Report Status PENDING  Incomplete  Culture, blood (routine x 2)     Status: None (Preliminary result)   Collection Time: 10/03/19  5:13 PM   Specimen: BLOOD RIGHT HAND  Result Value Ref Range Status   Specimen Description BLOOD RIGHT HAND  Final   Special Requests   Final    BOTTLES DRAWN AEROBIC AND ANAEROBIC Blood Culture adequate volume   Culture    Final    NO GROWTH 4 DAYS Performed at [facility], [address] [place], [place] [address]    Report Status PENDING  Incomplete  MRSA PCR Screening     Status: None   Collection Time: 10/03/19  8:30 PM   Specimen: Nasal Mucosa; Nasopharyngeal  Result Value Ref Range Status   MRSA by PCR NEGATIVE NEGATIVE Final    Comment:        The GeneXpert MRSA Assay (FDA approved for NASAL specimens only), is one component of a comprehensive MRSA colonization surveillance program. It is not intended to diagnose MRSA infection nor to guide or monitor treatment for MRSA infections. Performed at [facility], [address], [place], [place] [address]      Labs: BNP (last 3 results) Recent Labs    09/15/19 0932 09/22/19 1848 09/27/19 1052  BNP 1,151.0* 1,671.0* 1,194.1*   Basic Metabolic Panel: Recent Labs  Lab 10/03/19 1159 10/04/19 0431 10/05/19 1015 10/06/19 0443 10/07/19 0918  NA 136 141 139 139 139  K 4.8 3.6 4.4 4.1 3.9  CL 97* 104 102 103 100  CO2 27 28 26 27 31   GLUCOSE 483* 125* 301* 142* 143*  BUN 42* 36* 37* 38* 35*  CREATININE 1.65* 1.26* 1.54* 1.57* 1.41*  CALCIUM 9.5 9.2 9.2 9.2 9.0   Liver Function Tests: Recent Labs  Lab 10/03/19 1159  AST 15  ALT 18  ALKPHOS 52  BILITOT 1.1  PROT 6.5  ALBUMIN 3.5   No results for input(s): LIPASE, AMYLASE in the last 168 hours. No results for input(s): AMMONIA in the last 168 hours. CBC: Recent Labs  Lab 10/03/19 1159 10/04/19 0431 10/06/19 0443  WBC 6.2 5.8 4.9  HGB 11.5* 10.5* 11.5*  HCT 38.9 35.0* 37.7  MCV 92.8 91.9 92.2  PLT 122* 111* 127*   Cardiac Enzymes: No results for input(s): CKTOTAL, CKMB, CKMBINDEX, TROPONINI in the last 168 hours. BNP: Invalid  input(s): POCBNP CBG: Recent Labs  Lab 10/06/19 1134 10/06/19 1641 10/06/19 2125 10/07/19 0310 10/07/19 0738  GLUCAP 88 123* 129* 98 130*   D-Dimer No results for input(s): DDIMER in the last 72 hours. Hgb A1c No results for input(s):  HGBA1C in the last 72 hours. Lipid Profile No results for input(s): CHOL, HDL, LDLCALC, TRIG, CHOLHDL, LDLDIRECT in the last 72 hours. Thyroid function studies No results for input(s): TSH, T4TOTAL, T3FREE, THYROIDAB in the last 72 hours.  Invalid input(s): FREET3 Anemia work up No results for input(s): VITAMINB12, FOLATE, FERRITIN, TIBC, IRON, RETICCTPCT in the last 72 hours. Urinalysis    Component Value Date/Time   COLORURINE YELLOW 10/03/2019 1205   APPEARANCEUR HAZY (A) 10/03/2019 1205   APPEARANCEUR Clear 09/27/2017 1056   LABSPEC 1.011 10/03/2019 1205   PHURINE 5.0 10/03/2019 1205   GLUCOSEU >=500 (A) 10/03/2019 1205   HGBUR MODERATE (A) 10/03/2019 1205   BILIRUBINUR NEGATIVE 10/03/2019 1205   BILIRUBINUR Negative 09/27/2017 [place] 10/03/2019 1205   PROTEINUR 100 (A) 10/03/2019 1205   UROBILINOGEN negative 02/03/2016 1516   UROBILINOGEN 0.2 10/17/2015 1915   NITRITE NEGATIVE 10/03/2019 1205   LEUKOCYTESUR MODERATE (A) 10/03/2019 1205   Sepsis Labs Invalid input(s): PROCALCITONIN,  WBC,  LACTICIDVEN Microbiology Recent Results (from the past 240 hour(s))  Urine culture     Status: Abnormal   Collection Time: 10/03/19 12:05 PM   Specimen: Urine, Catheterized  Result Value Ref Range Status   Specimen Description   Final    URINE, CATHETERIZED Performed at [facility], [address], [place], [place] [address]    Special Requests   Final    NONE Performed at [facility], [address] [place],  [address]    Culture >=100,000 COLONIES/mL ESCHERICHIA COLI (A)  Final   Report Status 10/05/2019 FINAL  Final   Organism ID, Bacteria ESCHERICHIA COLI (A)  Final      Susceptibility   Escherichia coli - MIC*    AMPICILLIN <=2 SENSITIVE Sensitive     CEFAZOLIN <=4 SENSITIVE Sensitive     CEFTRIAXONE <=1 SENSITIVE Sensitive     CIPROFLOXACIN <=0.25 SENSITIVE Sensitive     GENTAMICIN <=1 SENSITIVE Sensitive     IMIPENEM <=0.25 SENSITIVE  Sensitive     NITROFURANTOIN <=16 SENSITIVE Sensitive     TRIMETH/SULFA <=20 SENSITIVE Sensitive     AMPICILLIN/SULBACTAM <=2 SENSITIVE Sensitive     PIP/TAZO <=4 SENSITIVE Sensitive     Extended ESBL NEGATIVE Sensitive     * >=100,000 COLONIES/mL ESCHERICHIA COLI  SARS CORONAVIRUS 2 (TAT 6-24 HRS) Nasopharyngeal Nasopharyngeal Swab     Status: None   Collection Time: 10/03/19  1:59 PM   Specimen: Nasopharyngeal Swab  Result Value Ref Range Status   SARS Coronavirus 2 NEGATIVE NEGATIVE Final    Comment: (NOTE) SARS-CoV-2 target nucleic acids are NOT DETECTED. The SARS-CoV-2 RNA is generally detectable in upper and lower respiratory specimens during the acute phase of infection. Negative results do not preclude SARS-CoV-2 infection, do not rule out co-infections with other pathogens, and should not be used as the sole basis for treatment or other patient management decisions. Negative results must be combined with clinical observations, patient history, and epidemiological information. The expected result is Negative. Fact Sheet for Patients: SugarRoll.be Fact Sheet for Healthcare Providers: https://www.woods-mathews.com/ This test is not yet approved or cleared by the [place] FDA and  has been authorized for detection and/or diagnosis of SARS-CoV-2 by FDA under an Emergency Use  Authorization (EUA). This EUA will remain  in effect (meaning this test can be used) for the duration of the COVID-19 declaration under Section 56 4(b)(1) of the Act, 21 U.S.C. section 360bbb-3(b)(1), unless the authorization is terminated or revoked sooner. Performed at [facility], [place] [address] [place], [place] [address]   Culture, blood (routine x 2)     Status: None (Preliminary result)   Collection Time: 10/03/19  5:13 PM   Specimen: Right Antecubital; Blood  Result Value Ref Range Status   Specimen Description RIGHT ANTECUBITAL  Final    Special Requests   Final    BOTTLES DRAWN AEROBIC AND ANAEROBIC Blood Culture adequate volume   Culture   Final    NO GROWTH 4 DAYS Performed at [facility], [address], [place], [place] [address]    Report Status PENDING  Incomplete  Culture, blood (routine x 2)     Status: None (Preliminary result)   Collection Time: 10/03/19  5:13 PM   Specimen: BLOOD RIGHT HAND  Result Value Ref Range Status   Specimen Description BLOOD RIGHT HAND  Final   Special Requests   Final    BOTTLES DRAWN AEROBIC AND ANAEROBIC Blood Culture adequate volume   Culture   Final    NO GROWTH 4 DAYS Performed at [facility], [address] [place], [place] [address]    Report Status PENDING  Incomplete  MRSA PCR Screening     Status: None   Collection Time: 10/03/19  8:30 PM   Specimen: Nasal Mucosa; Nasopharyngeal  Result Value Ref Range Status   MRSA by PCR NEGATIVE NEGATIVE Final    Comment:        The GeneXpert MRSA Assay (FDA approved for NASAL specimens only), is one component of a comprehensive MRSA colonization surveillance program. It is not intended to diagnose MRSA infection nor to guide or monitor treatment for MRSA infections. Performed at [facility], [address], [place], [place] [address]    Time coordinating discharge: 33 minutes   SIGNED:  Irwin Brakeman, MD  [facility] 10/07/2019, 10:19 AM How to contact the [facility] Attending or Consulting provider [place] or covering provider during after hours [place], for this patient?  1. Check the care team in [facility] and look for a) attending/consulting [facility] provider listed and b) the [facility] team listed 2. Log into [email or web address] and use [place]'s universal password to access. If you do not have the password, please contact the hospital operator. 3. Locate the [facility] provider you are looking for under [facility] and page to a number that you can be directly reached. 4. If you still have difficulty reaching the provider,  please page the [facility] (Director on Call) for the Hospitalists listed on amion for assistance.

## 2019-10-07 NOTE — Progress Notes (Signed)
Subjective: She is sleeping soundly this morning.  I have reviewed the CT.  Objective: Vital signs in last 24 hours: Temp:  [97.6 F (36.4 C)-97.8 F (36.6 C)] 97.8 F (36.6 C) (10/31 0745) Pulse Rate:  [29-129] 56 (10/31 0745) Resp:  [10-34] 21 (10/31 0745) BP: (138-190)/(80-103) 190/90 (10/31 0400) SpO2:  [90 %-100 %] 100 % (10/31 0758) Weight:  [67.6 kg] 67.6 kg (10/31 0557) Weight change:     Intake/Output from previous day: 10/30 0701 - 10/31 0700 In: -  Out: 2950 [Urine:2950]  PHYSICAL EXAM General appearance: Sleepy but arousable speech is  slurred Resp: rhonchi bilaterally Cardio: irregularly irregular rhythm GI: soft, non-tender; bowel sounds normal; no masses,  no organomegaly Extremities: extremities normal, atraumatic, no cyanosis or edema  Lab Results:  Results for orders placed or performed during the hospital encounter of 10/03/19 (from the past 48 hour(s))  Glucose, capillary     Status: Abnormal   Collection Time: 10/05/19  8:15 AM  Result Value Ref Range   Glucose-Capillary 142 (H) 70 - 99 mg/dL  Basic metabolic panel     Status: Abnormal   Collection Time: 10/05/19 10:15 AM  Result Value Ref Range   Sodium 139 135 - 145 mmol/L   Potassium 4.4 3.5 - 5.1 mmol/L    Comment: DELTA CHECK NOTED   Chloride 102 98 - 111 mmol/L   CO2 26 22 - 32 mmol/L   Glucose, Bld 301 (H) 70 - 99 mg/dL   BUN 37 (H) 8 - 23 mg/dL   Creatinine, Ser 1.54 (H) 0.44 - 1.00 mg/dL   Calcium 9.2 8.9 - 10.3 mg/dL   GFR calc non Af Amer 30 (L) >60 mL/min   GFR calc Af Amer 35 (L) >60 mL/min   Anion gap 11 5 - 15    Comment: Performed at [facility], [address], [place], [place] [address]  Glucose, capillary     Status: Abnormal   Collection Time: 10/05/19 11:42 AM  Result Value Ref Range   Glucose-Capillary 292 (H) 70 - 99 mg/dL  Glucose, capillary     Status: Abnormal   Collection Time: 10/05/19  4:53 PM  Result Value Ref Range   Glucose-Capillary 287 (H) 70 - 99 mg/dL   Glucose, capillary     Status: Abnormal   Collection Time: 10/05/19  9:12 PM  Result Value Ref Range   Glucose-Capillary 272 (H) 70 - 99 mg/dL   Comment 1 Notify RN    Comment 2 Document in Chart   Glucose, capillary     Status: Abnormal   Collection Time: 10/06/19  2:30 AM  Result Value Ref Range   Glucose-Capillary 180 (H) 70 - 99 mg/dL   Comment 1 Notify RN    Comment 2 Document in Chart   Basic metabolic panel     Status: Abnormal   Collection Time: 10/06/19  4:43 AM  Result Value Ref Range   Sodium 139 135 - 145 mmol/L   Potassium 4.1 3.5 - 5.1 mmol/L   Chloride 103 98 - 111 mmol/L   CO2 27 22 - 32 mmol/L   Glucose, Bld 142 (H) 70 - 99 mg/dL   BUN 38 (H) 8 - 23 mg/dL   Creatinine, Ser 1.57 (H) 0.44 - 1.00 mg/dL   Calcium 9.2 8.9 - 10.3 mg/dL   GFR calc non Af Amer 30 (L) >60 mL/min   GFR calc Af Amer 34 (L) >60 mL/min   Anion gap 9 5 - 15  Comment: Performed at [facility], [address] [place], [place] [address]  CBC     Status: Abnormal   Collection Time: 10/06/19  4:43 AM  Result Value Ref Range   WBC 4.9 4.0 - 10.5 K/uL   RBC 4.09 3.87 - 5.11 MIL/uL   Hemoglobin 11.5 (L) 12.0 - 15.0 g/dL   HCT 37.7 36.0 - 46.0 %   MCV 92.2 80.0 - 100.0 fL   MCH 28.1 26.0 - 34.0 pg   MCHC 30.5 30.0 - 36.0 g/dL   RDW 16.0 (H) 11.5 - 15.5 %   Platelets 127 (L) 150 - 400 K/uL   nRBC 0.0 0.0 - 0.2 %    Comment: Performed at [facility], [address], [place], [place] [address]  Glucose, capillary     Status: None   Collection Time: 10/06/19  7:39 AM  Result Value Ref Range   Glucose-Capillary 80 70 - 99 mg/dL  Glucose, capillary     Status: None   Collection Time: 10/06/19 11:34 AM  Result Value Ref Range   Glucose-Capillary 88 70 - 99 mg/dL  Glucose, capillary     Status: Abnormal   Collection Time: 10/06/19  4:41 PM  Result Value Ref Range   Glucose-Capillary 123 (H) 70 - 99 mg/dL  Glucose, capillary     Status: Abnormal   Collection Time: 10/06/19  9:25 PM   Result Value Ref Range   Glucose-Capillary 129 (H) 70 - 99 mg/dL   Comment 1 Notify RN    Comment 2 Document in Chart   Glucose, capillary     Status: None   Collection Time: 10/07/19  3:10 AM  Result Value Ref Range   Glucose-Capillary 98 70 - 99 mg/dL  Glucose, capillary     Status: Abnormal   Collection Time: 10/07/19  7:38 AM  Result Value Ref Range   Glucose-Capillary 130 (H) 70 - 99 mg/dL    ABGS No results for input(s): PHART, PO2ART, TCO2, HCO3 in the last 72 hours.  Invalid input(s): PCO2 CULTURES Recent Results (from the past 240 hour(s))  Urine culture     Status: Abnormal   Collection Time: 10/03/19 12:05 PM   Specimen: Urine, Catheterized  Result Value Ref Range Status   Specimen Description   Final    URINE, CATHETERIZED Performed at [facility], [address] [place], [place] [address]    Special Requests   Final    NONE Performed at [facility], [address], [place], [place] [address]    Culture >=100,000 COLONIES/mL ESCHERICHIA COLI (A)  Final   Report Status 10/05/2019 FINAL  Final   Organism ID, Bacteria ESCHERICHIA COLI (A)  Final      Susceptibility   Escherichia coli - MIC*    AMPICILLIN <=2 SENSITIVE Sensitive     CEFAZOLIN <=4 SENSITIVE Sensitive     CEFTRIAXONE <=1 SENSITIVE Sensitive     CIPROFLOXACIN <=0.25 SENSITIVE Sensitive     GENTAMICIN <=1 SENSITIVE Sensitive     IMIPENEM <=0.25 SENSITIVE Sensitive     NITROFURANTOIN <=16 SENSITIVE Sensitive     TRIMETH/SULFA <=20 SENSITIVE Sensitive     AMPICILLIN/SULBACTAM <=2 SENSITIVE Sensitive     PIP/TAZO <=4 SENSITIVE Sensitive     Extended ESBL NEGATIVE Sensitive     * >=100,000 COLONIES/mL ESCHERICHIA COLI  SARS CORONAVIRUS 2 (TAT 6-24 HRS) Nasopharyngeal Nasopharyngeal Swab     Status: None   Collection Time: 10/03/19  1:59 PM   Specimen: Nasopharyngeal Swab  Result Value  Ref Range Status   SARS Coronavirus 2 NEGATIVE NEGATIVE Final    Comment: (NOTE) SARS-CoV-2 target nucleic  acids are NOT DETECTED. The SARS-CoV-2 RNA is generally detectable in upper and lower respiratory specimens during the acute phase of infection. Negative results do not preclude SARS-CoV-2 infection, do not rule out co-infections with other pathogens, and should not be used as the sole basis for treatment or other patient management decisions. Negative results must be combined with clinical observations, patient history, and epidemiological information. The expected result is Negative. Fact Sheet for Patients: SugarRoll.be Fact Sheet for Healthcare Providers: https://www.woods-mathews.com/ This test is not yet approved or cleared by the [place] FDA and  has been authorized for detection and/or diagnosis of SARS-CoV-2 by FDA under an Emergency Use Authorization (EUA). This EUA will remain  in effect (meaning this test can be used) for the duration of the COVID-19 declaration under Section 56 4(b)(1) of the Act, 21 U.S.C. section 360bbb-3(b)(1), unless the authorization is terminated or revoked sooner. Performed at [facility], [place] [address], [place], [place] [address]   Culture, blood (routine x 2)     Status: None (Preliminary result)   Collection Time: 10/03/19  5:13 PM   Specimen: Right Antecubital; Blood  Result Value Ref Range Status   Specimen Description RIGHT ANTECUBITAL  Final   Special Requests   Final    BOTTLES DRAWN AEROBIC AND ANAEROBIC Blood Culture adequate volume   Culture   Final    NO GROWTH 4 DAYS Performed at [facility], [address], [place], [place] [address]    Report Status PENDING  Incomplete  Culture, blood (routine x 2)     Status: None (Preliminary result)   Collection Time: 10/03/19  5:13 PM   Specimen: BLOOD RIGHT HAND  Result Value Ref Range Status   Specimen Description BLOOD RIGHT HAND  Final   Special Requests   Final    BOTTLES DRAWN AEROBIC AND ANAEROBIC Blood Culture adequate volume    Culture   Final    NO GROWTH 4 DAYS Performed at [facility], [address], [place], [place] [address]    Report Status PENDING  Incomplete  MRSA PCR Screening     Status: None   Collection Time: 10/03/19  8:30 PM   Specimen: Nasal Mucosa; Nasopharyngeal  Result Value Ref Range Status   MRSA by PCR NEGATIVE NEGATIVE Final    Comment:        The GeneXpert MRSA Assay (FDA approved for NASAL specimens only), is one component of a comprehensive MRSA colonization surveillance program. It is not intended to diagnose MRSA infection nor to guide or monitor treatment for MRSA infections. Performed at [facility], [address] [place], [place] [address]    Studies/Results: Ct Chest Wo Contrast  Result Date: 10/06/2019 CLINICAL DATA:  Pneumonia, shortness of breath, chest pain EXAM: CT CHEST WITHOUT CONTRAST TECHNIQUE: Multidetector CT imaging of the chest was performed following the standard protocol without IV contrast. COMPARISON:  09/15/2019 FINDINGS: Cardiovascular: Calcific atherosclerosis of the aorta and major branch vessels. Limited without IV contrast. No large aneurysm. Native coronary atherosclerosis noted. Previous median sternotomy present. Heart is enlarged. No pericardial effusion. Pacer wires noted in the right heart. Mediastinum/Nodes: Inferior thyroid dystrophic calcifications noted thyroid normal in size. Trachea and central airways are patent. Esophagus is nondilated. No large hiatal hernia. No abnormal lymph nodes are bulky adenopathy. Lungs/Pleura: Enlarging pleural effusions bilaterally, worse on the right. Worsening bilateral lower lobe collapse/consolidation.  Difficult to exclude underlying obscured lower lobe mass or other abnormality. Slight increased lingula and right middle lobe atelectasis as well. No pneumothorax. Upper Abdomen: Calcified hepatic granuloma noted in the left lobe. Artifact from the overlying arms. Spleen is normal in size. Abdominal  atherosclerosis noted. Left kidney demonstrates no hydronephrosis. No definite acute finding in the upper abdomen. Musculoskeletal: Degenerative changes of the spine. Increased body anasarca of the lower chest. Chronic L2 compression fracture. IMPRESSION: Enlarging pleural effusions with worsening bibasilar collapse/consolidation, more pronounced on the right. Aortic Atherosclerosis (ICD10-I70.0). Electronically Signed   By: Jerilynn Mages.  Shick M.D.   On: 10/06/2019 14:34    Medications:  Prior to Admission:  Medications Prior to Admission  Medication Sig Dispense Refill Last Dose  . albuterol (PROVENTIL HFA;VENTOLIN HFA) 108 (90 Base) MCG/ACT inhaler Inhale 2 puffs into the lungs every 6 (six) hours as needed for wheezing or shortness of breath. 1 Inhaler 5   . apixaban (ELIQUIS) 2.5 MG TABS tablet Take 1 tablet (2.5 mg total) by mouth 2 (two) times daily. 60 tablet 11 10/02/2019 at 2100  . atorvastatin (LIPITOR) 20 MG tablet Take 1 tablet (20 mg total) by mouth at bedtime. 90 tablet 3 10/02/2019 at Unknown time  . Carboxymethylcellul-Glycerin (CLEAR EYES FOR DRY EYES) 1-0.25 % SOLN Place 1 drop into both eyes daily as needed (irritation).      . clotrimazole-betamethasone (LOTRISONE) cream Apply 1 application topically 2 (two) times daily. 30 g 0   . diltiazem (CARDIZEM) 30 MG tablet Take 1 tablet (30 mg total) by mouth every 8 (eight) hours. 90 tablet 1 10/02/2019 at Unknown time  . docusate sodium (COLACE) 100 MG capsule Take 1 capsule (100 mg total) by mouth 2 (two) times daily. 60 capsule 0 10/02/2019 at Unknown time  . esomeprazole (NEXIUM) 40 MG capsule TAKE 1 CAPSULE BY MOUTH ONCE DAILY. 30 capsule 0 10/02/2019 at Unknown time  . ferrous sulfate 324 (65 Fe) MG TBEC Take 324 mg by mouth every morning.    10/02/2019 at Unknown time  . furosemide (LASIX) 40 MG tablet Take 2 tablets (80 mg total) by mouth daily. (Patient taking differently: Take 40 mg by mouth 2 (two) times daily. ) 60 tablet 6 10/02/2019  at Unknown time  . Insulin Detemir (LEVEMIR FLEXTOUCH) 100 UNIT/ML Pen INJECT 24 UNITS EACH MORNING 9 mL 2 10/02/2019 at Unknown time  . insulin lispro (HUMALOG) 100 UNIT/ML injection Check glucose before each meal (TID). If over 200 take 5 units. If over 300 take 8 units. If over 400 take 10 units. 10 mL 11 10/02/2019 at Unknown time  . magnesium hydroxide (MILK OF MAGNESIA) 400 MG/5ML suspension Take 30 mLs by mouth daily as needed for moderate constipation.     . metoprolol succinate (TOPROL-XL) 100 MG 24 hr tablet Take 1 tablet (100 mg total) by mouth daily. For blood pressure control 30 tablet 1 10/02/2019 at 900  . Multiple Vitamins-Iron (DAILY VITAMINS/IRON/BETA CAROT PO) Take 1 tablet by mouth at bedtime.   10/02/2019 at Unknown time  . nitroGLYCERIN (NITROSTAT) 0.4 MG SL tablet PLACE 1 TAB UNDER TONGUE EVERY 3 MINUTES AS DIRECTED UP TO 3 TIMES ASNEEDED FOR CHEST PAIN. 25 tablet 0   . PARoxetine (PAXIL) 20 MG tablet TAKE 1 TABLET BY MOUTH EVERY MORNING. 30 tablet 0 10/02/2019 at Unknown time  . polyethylene glycol (MIRALAX) 17 g packet Take 17 g by mouth 2 (two) times daily. 100 each 1 10/02/2019 at Unknown time  . ramipril (ALTACE) 5  MG capsule Take 1 capsule (5 mg total) by mouth daily. 90 capsule 3 10/02/2019 at Unknown time   Scheduled: . apixaban  2.5 mg Oral BID  . atorvastatin  20 mg Oral QHS  . Chlorhexidine Gluconate Cloth  6 each Topical Daily  . diltiazem  120 mg Oral Daily  . furosemide  40 mg Intravenous Q12H  . hydrocortisone  25 mg Rectal BID  . insulin aspart  0-9 Units Subcutaneous TID WC  . insulin aspart  4 Units Subcutaneous TID WC  . insulin detemir  16 Units Subcutaneous Q2200  . metoprolol succinate  100 mg Oral Daily  . pantoprazole  40 mg Oral Daily  . PARoxetine  20 mg Oral q morning - 10a  . polyethylene glycol  17 g Oral Daily  . ramipril  5 mg Oral Daily  . senna  1 tablet Oral BID   Continuous:  VJK:[id number], ondansetron **OR** ondansetron (ZOFRAN)  IV, traMADol  Assesment: She was admitted with metabolic encephalopathy and altered mental status which is improving.  She has abnormal chest x-ray and chest CT and there was concern that she had a mass.  Noncontrasted CT was done yesterday and I do not think we can tell for sure if this is a mass or compressive atelectasis which is what I favor.  If her renal function improves enough at some point she should have a contrasted CT.  It looks like she has worsened pleural effusions from about 10 days ago.  She was dehydrated on admission which has resolved and she is now back on her home medications for chronic heart failure.  She has atrial fib with RVR and her heart rate is better controlled Active Problems:   Metabolic encephalopathy   Altered mental status   Atrial fibrillation with RVR ([place])   Sepsis ([place])   Advanced care planning/counseling discussion   Goals of care, counseling/discussion   Palliative care by specialist   Hemorrhoids    Plan: Continue treatments.  I do not think she needs to have any further investigation of the potential lung mass in the hospital.  If her renal function improves as an outpatient she certainly would be a possibility to have a CT with contrast which would be a better test.  Alternatively we could see about doing a PET scan as an outpatient and if the area in her lung is metabolically active at that point further evaluation may be indicated.  I will plan to follow more peripherally.  Thanks for allowing me to see her with you    LOS: 4 days   Alonza Bogus 10/07/2019, 8:08 AM

## 2019-10-07 NOTE — Discharge Instructions (Signed)
Please follow up lung nodule with your primary care physician and make appointment with pulmonologist to discuss    IMPORTANT INFORMATION: PAY CLOSE ATTENTION   PHYSICIAN DISCHARGE INSTRUCTIONS  Follow with Primary care provider  [name], MD  and other consultants as instructed by your Hospitalist Physician  [place] IF SYMPTOMS COME BACK, WORSEN OR NEW PROBLEM DEVELOPS   Please note: You were cared for by a hospitalist during your hospital stay. Every effort will be made to forward records to your primary care provider.  You can request that your primary care provider send for your hospital records if they have not received them.  Once you are discharged, your primary care physician will handle any further medical issues. Please note that NO REFILLS for any discharge medications will be authorized once you are discharged, as it is imperative that you return to your primary care physician (or establish a relationship with a primary care physician if you do not have one) for your post hospital discharge needs so that they can reassess your need for medications and monitor your lab values.  Please get a complete blood count and chemistry panel checked by your Primary MD at your next visit, and again as instructed by your Primary MD.  Get Medicines reviewed and adjusted: Please take all your medications with you for your next visit with your Primary MD  Laboratory/radiological data: Please request your Primary MD to go over all hospital tests and procedure/radiological results at the follow up, please ask your primary care provider to get all Hospital records sent to his/her office.  In some cases, they will be blood work, cultures and biopsy results pending at the time of your discharge. Please request that your primary care provider follow up on these results.  If you are diabetic, please bring your blood sugar readings with you to your follow up  appointment with primary care.    Please call and make your follow up appointments as soon as possible.    Also Note the following: If you experience worsening of your admission symptoms, develop shortness of breath, life threatening emergency, suicidal or homicidal thoughts you must seek medical attention immediately by calling 911 or calling your MD immediately  if symptoms less severe.  You must read complete instructions/literature along with all the possible adverse reactions/side effects for all the Medicines you take and that have been prescribed to you. Take any new Medicines after you have completely understood and accpet all the possible adverse reactions/side effects.   Do not drive when taking Pain medications or sleeping medications (Benzodiazepines)  Do not take more than prescribed Pain, Sleep and Anxiety Medications. It is not advisable to combine anxiety,sleep and pain medications without talking with your primary care practitioner  Special Instructions: If you have smoked or chewed Tobacco  in the last 2 yrs please stop smoking, stop any regular Alcohol  and or any Recreational drug use.  Wear Seat belts while driving.  Do not drive if taking any narcotic, mind altering or controlled substances or recreational drugs or alcohol.

## 2019-10-08 LAB — CULTURE, BLOOD (ROUTINE X 2)
Culture: NO GROWTH
Culture: NO GROWTH
Special Requests: ADEQUATE
Special Requests: ADEQUATE

## 2019-10-09 ENCOUNTER — Telehealth: Payer: Self-pay | Admitting: Family Medicine

## 2019-10-10 NOTE — Telephone Encounter (Signed)
Patient has a follow up appointment scheduled. 

## 2019-10-11 ENCOUNTER — Other Ambulatory Visit: Payer: Self-pay | Admitting: *Deleted

## 2019-10-11 DIAGNOSIS — N898 Other specified noninflammatory disorders of vagina: Secondary | ICD-10-CM

## 2019-10-11 MED ORDER — CLOTRIMAZOLE-BETAMETHASONE 1-0.05 % EX CREA: 1.0000 "application " | TOPICAL_CREAM | Freq: Two times a day (BID) | CUTANEOUS | 0 refills | Status: AC

## 2019-10-12 ENCOUNTER — Ambulatory Visit: Payer: Self-pay | Admitting: *Deleted

## 2019-10-12 DIAGNOSIS — I5032 Chronic diastolic (congestive) heart failure: Secondary | ICD-10-CM

## 2019-10-12 DIAGNOSIS — I10 Essential (primary) hypertension: Secondary | ICD-10-CM

## 2019-10-12 DIAGNOSIS — E119 Type 2 diabetes mellitus without complications: Secondary | ICD-10-CM

## 2019-10-12 NOTE — Patient Instructions (Signed)
  Follow up plan:  CCM team will outreach to the patient by telephone over the next 5 days to review and offer CCM services   Chong Sicilian, BSN, RN-BC [place] / [place] Management Direct Dial: [phone]

## 2019-10-12 NOTE — Chronic Care Management (AMB) (Signed)
  Chronic Care Management   RN Note  10/12/2019 Name: [name] MRN: [id number] DOB: 11-Sep-1931  [name] is an 85 year-old female, primary care patient of [name], MD who was referred to the embedded CCM team to follow-up on questions/concerns s/p hospital discharge.  She was admitted on 10/03/2019 for altered mental status in the presence of acute metabolic encephalopathy, UTI, dehydration, hypoglycemia, Fib, and diastolic CHF (chronic pulmonary edema). She was discharged home on 10/07/2019 after declining SNF. She has support from her daughters and one of them lives in the home with her.     Recommendations for Outpatient Follow-up (discharge instructions):  1. Follow up with PCP in 1 weeks 2. Please establish care with [facility] to follow up lung nodule 3. Consider outpatient PET scan and / or CT chest with contrast if renal function permits 4. Please follow up with cardiology in 2 weeks 5. Home Health: PT, RN, SW     Chart review indicates that daughter called into PCP office on 10/09/19 and a hosp f/u is scheduled for 10/16/2019.  Follow up plan:  CCM team will outreach to the patient by telephone over the next 5 days to review and offer CCM services   Chong Sicilian, BSN, RN-BC [place] / [place] [facility] Direct Dial: [phone]

## 2019-10-13 ENCOUNTER — Other Ambulatory Visit: Payer: Self-pay

## 2019-10-13 ENCOUNTER — Ambulatory Visit: Payer: Medicare Other | Admitting: *Deleted

## 2019-10-13 DIAGNOSIS — E119 Type 2 diabetes mellitus without complications: Secondary | ICD-10-CM

## 2019-10-13 DIAGNOSIS — I5032 Chronic diastolic (congestive) heart failure: Secondary | ICD-10-CM

## 2019-10-13 DIAGNOSIS — I10 Essential (primary) hypertension: Secondary | ICD-10-CM

## 2019-10-13 NOTE — Patient Instructions (Signed)
  Diabetes  Check and record blood sugar morning, night, and before meals. Also record insulin adminstration dose and time and any symptoms [name] has. Bring this to her appointment with Dr Livia Snellen on 10/16/2019.   Call [phone] with any blood sugar readings outside of the recommended range. Advised that someone is available by phone even after hours and on the weekend.   Verbal education provided on normal CBG range, management of subjective hypoglycemia symptoms, and management of real hypoglycemia.   Recurrent UTI  Talk with Dr Livia Snellen about restarting prophylaxis  Verbal education provided on kidney failure and it's effect on medications  Increase water intake  Constipation & Hemorrhoids  Increase water intake  Use Miralax and MOM as advised  Continue Preparation H if it helps  Report any new or worsening symptoms   RN Care Manager will f/u with patient/family over the next 7 days.  Patient will reach out to PCP office as needed.   Patient will seek emergency medical attention as needed  [name] was given information about Chronic Care Management services today including:  1. CCM service includes personalized support from designated clinical staff supervised by her physician, including individualized plan of care and coordination with other care providers 2. 24/7 contact phone numbers for assistance for urgent and routine care needs. 3. Service will only be billed when office clinical staff spend 20 minutes or more in a month to coordinate care. 4. Only one practitioner may furnish and bill the service in a calendar month. 5. The patient may stop CCM services at any time (effective at the end of the month) by phone call to the office staff. 6. The patient will be responsible for cost sharing (co-pay) of up to 20% of the service fee (after annual deductible is met).  Patient did not agree to enrollment in care management services and does not wish to consider at this  time.    Chong Sicilian, BSN, RN-BC Embedded Chronic Care Manager [facility] / [place] [facility] Direct Dial: [phone]

## 2019-10-13 NOTE — Chronic Care Management (AMB) (Signed)
Care Management   Initial Visit Note  10/13/2019 Name: [name] MRN: [id number] DOB: Mar 04, 1931  Referred by: [name], MD Reason for referral : Chronic Care Management (RN CCM outreach)   [name] is a 85 y.o. year old female who is a primary care patient of Stacks, Cletus Gash, MD. The CCM team was consulted for assistance with chronic disease management and care coordination needs related to Atrial Fibrillation, CHF, CAD, HTN, DMII and Dementia  Review of patient status, including review of consultants reports, relevant laboratory and other test results, and collaboration with appropriate care team members and the patient's provider was performed as part of comprehensive patient evaluation and provision of chronic care management services.    SDOH (Social Determinants of Health) screening performed today: Stress Physical Activity. See Care Plan for related entries.   Subjective: I spoke with daughter, [name], by telephone today. She was referred to the [facility] team after hospital discharge. She does not have any problems with access to food, medication, transportation or housing.  [name] was recently discharged from the hospital to home after refusing SNF. [name] reports that [name]'s blood sugar "dropped too low" this morning. The reading was 66 and [name] felt shaky and symptomatic for hypoglycemia. [name] provided peanut crackers but it didn't come up quickly so she gave her apple juice. [name] felt better and the CBG was not rechecked. Medication list has Levemir 16 units once daily but [name] states that they were told to administer it twice a day. Metformin was discontinued. She is using Altace daily and humalog sliding scale as needed.   [name] is concerned that Methenamine was discontinued and would like to talk with Dr Livia Snellen about restarting that. She uses this to prevent UTIs and it works well for her.   She is having some problems with hemorrhoids and constipation. Using  Miralax regularly and MOM as needed. Last BM was last night after haven gone 2 days without one.   Objective: Outpatient Encounter Medications as of 10/13/2019  Medication Sig Note  . albuterol (PROVENTIL HFA;VENTOLIN HFA) 108 (90 Base) MCG/ACT inhaler Inhale 2 puffs into the lungs every 6 (six) hours as needed for wheezing or shortness of breath.   Marland Kitchen apixaban (ELIQUIS) 2.5 MG TABS tablet Take 1 tablet (2.5 mg total) by mouth 2 (two) times daily.   Marland Kitchen atorvastatin (LIPITOR) 20 MG tablet Take 1 tablet (20 mg total) by mouth at bedtime.   . Carboxymethylcellul-Glycerin (CLEAR EYES FOR DRY EYES) 1-0.25 % SOLN Place 1 drop into both eyes daily as needed (irritation).    . clotrimazole-betamethasone (LOTRISONE) cream Apply 1 application topically 2 (two) times daily.   Marland Kitchen diltiazem (CARDIZEM CD) 120 MG 24 hr capsule Take 1 capsule (120 mg total) by mouth daily.   Marland Kitchen docusate sodium (COLACE) 100 MG capsule Take 1 capsule (100 mg total) by mouth 2 (two) times daily.   Marland Kitchen esomeprazole (NEXIUM) 40 MG capsule TAKE 1 CAPSULE BY MOUTH ONCE DAILY.   . ferrous sulfate 324 (65 Fe) MG TBEC Take 324 mg by mouth every morning.    . furosemide (LASIX) 40 MG tablet Take 2 tablets (80 mg total) by mouth daily. (Patient taking differently: Take 40 mg by mouth 2 (two) times daily. )   . Insulin Detemir (LEVEMIR FLEXTOUCH) 100 UNIT/ML Pen INJECT 16 UNITS daily 10/13/2019: 10/13/19 Reports that they were told to administer 16 units BID  . insulin lispro (HUMALOG) 100 UNIT/ML injection Check glucose before each meal (TID). If over  200 take 5 units. If over 300 take 8 units. If over 400 take 10 units.   . magnesium hydroxide (MILK OF MAGNESIA) 400 MG/5ML suspension Take 30 mLs by mouth daily as needed for moderate constipation. 10/13/2019: Using as needed  . metoprolol succinate (TOPROL-XL) 100 MG 24 hr tablet Take 1 tablet (100 mg total) by mouth daily. For blood pressure control   . Multiple Vitamins-Iron (DAILY  VITAMINS/IRON/BETA CAROT PO) Take 1 tablet by mouth at bedtime.   . nitroGLYCERIN (NITROSTAT) 0.4 MG SL tablet PLACE 1 TAB UNDER TONGUE EVERY 3 MINUTES AS DIRECTED UP TO 3 TIMES ASNEEDED FOR CHEST PAIN.   Marland Kitchen PARoxetine (PAXIL) 20 MG tablet TAKE 1 TABLET BY MOUTH EVERY MORNING.   Marland Kitchen polyethylene glycol (MIRALAX) 17 g packet Take 17 g by mouth 2 (two) times daily.   . ramipril (ALTACE) 5 MG capsule Take 1 capsule (5 mg total) by mouth daily.    No facility-administered encounter medications on file as of 10/13/2019.    Medications reviewed and reconciled. Instructed to review with PCP at visit on 10/16/2019 to determine accuracy of instructions.   Lab Results  Component Value Date   HGBA1C 7.5 (H) 09/23/2019   HGBA1C 8.5 (H) 06/29/2019   HGBA1C 6.8 01/20/2019   Lab Results  Component Value Date   MICROALBUR 50 06/15/2014   LDLCALC 42 10/18/2018   CREATININE 1.41 (H) 10/07/2019     Assessment and Care Plan  Diabetes  Check and record blood sugar morning, night, and before meals. Also record insulin adminstration dose and time and any symptoms [name] has. Bring this to her appointment with Dr Livia Snellen on 10/16/2019.   Call [phone] with any blood sugar readings outside of the recommended range. Advised that someone is available by phone even after hours and on the weekend.   Verbal education provided on normal CBG range, management of subjective hypoglycemia symptoms, and management of real hypoglycemia.   Recurrent UTI  Talk with Dr Livia Snellen about restarting prophylaxis  Verbal education provided on kidney failure and it's effect on medications  Increase water intake  Constipation & Hemorrhoids  Increase water intake  Use Miralax and MOM as advised  Continue Preparation H if it helps  Report any new or worsening symptoms   RN Care Manager will f/u with patient/family over the next 7 days.  Patient will reach out to PCP office as needed.   Patient will seek emergency  medical attention as needed  [name] was given information about Chronic Care Management services today including:  1. CCM service includes personalized support from designated clinical staff supervised by her physician, including individualized plan of care and coordination with other care providers 2. 24/7 contact phone numbers for assistance for urgent and routine care needs. 3. Service will only be billed when office clinical staff spend 20 minutes or more in a month to coordinate care. 4. Only one practitioner may furnish and bill the service in a calendar month. 5. The patient may stop CCM services at any time (effective at the end of the month) by phone call to the office staff. 6. The patient will be responsible for cost sharing (co-pay) of up to 20% of the service fee (after annual deductible is met).  Patient did not agree to enrollment in care management services and does not wish to consider at this time.    Chong Sicilian, BSN, RN-BC Embedded Chronic Care Manager [facility] / [place] Management Direct Dial: [phone]

## 2019-10-14 DIAGNOSIS — D509 Iron deficiency anemia, unspecified: Secondary | ICD-10-CM | POA: Diagnosis not present

## 2019-10-14 DIAGNOSIS — M519 Unspecified thoracic, thoracolumbar and lumbosacral intervertebral disc disorder: Secondary | ICD-10-CM | POA: Diagnosis not present

## 2019-10-14 DIAGNOSIS — K219 Gastro-esophageal reflux disease without esophagitis: Secondary | ICD-10-CM | POA: Diagnosis not present

## 2019-10-14 DIAGNOSIS — I252 Old myocardial infarction: Secondary | ICD-10-CM | POA: Diagnosis not present

## 2019-10-14 DIAGNOSIS — I110 Hypertensive heart disease with heart failure: Secondary | ICD-10-CM | POA: Diagnosis not present

## 2019-10-14 DIAGNOSIS — I69322 Dysarthria following cerebral infarction: Secondary | ICD-10-CM | POA: Diagnosis not present

## 2019-10-14 DIAGNOSIS — M419 Scoliosis, unspecified: Secondary | ICD-10-CM | POA: Diagnosis not present

## 2019-10-14 DIAGNOSIS — N390 Urinary tract infection, site not specified: Secondary | ICD-10-CM | POA: Diagnosis not present

## 2019-10-14 DIAGNOSIS — N1832 Chronic kidney disease, stage 3b: Secondary | ICD-10-CM | POA: Diagnosis not present

## 2019-10-14 DIAGNOSIS — J45909 Unspecified asthma, uncomplicated: Secondary | ICD-10-CM | POA: Diagnosis not present

## 2019-10-14 DIAGNOSIS — I2510 Atherosclerotic heart disease of native coronary artery without angina pectoris: Secondary | ICD-10-CM | POA: Diagnosis not present

## 2019-10-14 DIAGNOSIS — M1712 Unilateral primary osteoarthritis, left knee: Secondary | ICD-10-CM | POA: Diagnosis not present

## 2019-10-14 DIAGNOSIS — I5032 Chronic diastolic (congestive) heart failure: Secondary | ICD-10-CM | POA: Diagnosis not present

## 2019-10-14 DIAGNOSIS — E1122 Type 2 diabetes mellitus with diabetic chronic kidney disease: Secondary | ICD-10-CM | POA: Diagnosis not present

## 2019-10-14 DIAGNOSIS — I69398 Other sequelae of cerebral infarction: Secondary | ICD-10-CM | POA: Diagnosis not present

## 2019-10-14 DIAGNOSIS — M160 Bilateral primary osteoarthritis of hip: Secondary | ICD-10-CM | POA: Diagnosis not present

## 2019-10-14 DIAGNOSIS — I080 Rheumatic disorders of both mitral and aortic valves: Secondary | ICD-10-CM | POA: Diagnosis not present

## 2019-10-14 DIAGNOSIS — H5462 Unqualified visual loss, left eye, normal vision right eye: Secondary | ICD-10-CM | POA: Diagnosis not present

## 2019-10-14 DIAGNOSIS — I4891 Unspecified atrial fibrillation: Secondary | ICD-10-CM | POA: Diagnosis not present

## 2019-10-14 DIAGNOSIS — H9190 Unspecified hearing loss, unspecified ear: Secondary | ICD-10-CM | POA: Diagnosis not present

## 2019-10-14 DIAGNOSIS — S51812D Laceration without foreign body of left forearm, subsequent encounter: Secondary | ICD-10-CM | POA: Diagnosis not present

## 2019-10-16 ENCOUNTER — Other Ambulatory Visit: Payer: Self-pay

## 2019-10-16 ENCOUNTER — Encounter: Payer: Self-pay | Admitting: Family Medicine

## 2019-10-16 ENCOUNTER — Ambulatory Visit (INDEPENDENT_AMBULATORY_CARE_PROVIDER_SITE_OTHER): Payer: Medicare Other | Admitting: Family Medicine

## 2019-10-16 ENCOUNTER — Other Ambulatory Visit: Payer: Self-pay | Admitting: Family Medicine

## 2019-10-16 VITALS — BP 159/98 | HR 108 | Temp 97.5°F

## 2019-10-16 DIAGNOSIS — I5032 Chronic diastolic (congestive) heart failure: Secondary | ICD-10-CM

## 2019-10-16 DIAGNOSIS — R399 Unspecified symptoms and signs involving the genitourinary system: Secondary | ICD-10-CM | POA: Diagnosis not present

## 2019-10-16 DIAGNOSIS — R601 Generalized edema: Secondary | ICD-10-CM | POA: Diagnosis not present

## 2019-10-16 DIAGNOSIS — R300 Dysuria: Secondary | ICD-10-CM | POA: Diagnosis not present

## 2019-10-16 DIAGNOSIS — I4891 Unspecified atrial fibrillation: Secondary | ICD-10-CM | POA: Diagnosis not present

## 2019-10-16 LAB — URINALYSIS, COMPLETE
Bilirubin, UA: NEGATIVE
Glucose, UA: NEGATIVE
Ketones, UA: NEGATIVE
Nitrite, UA: POSITIVE — AB
Specific Gravity, UA: 1.015 (ref 1.005–1.030)
Urobilinogen, Ur: 0.2 mg/dL (ref 0.2–1.0)
pH, UA: 6 (ref 5.0–7.5)

## 2019-10-16 LAB — MICROSCOPIC EXAMINATION: WBC, UA: >30 /hpf — AB (ref 0–5)

## 2019-10-16 MED ORDER — LEVOFLOXACIN 250 MG PO TABS: 250.0000 mg | ORAL_TABLET | Freq: Every day | ORAL | 0 refills | Status: DC

## 2019-10-16 MED ORDER — METHENAMINE MANDELATE 1 G PO TABS: 1000.0000 mg | ORAL_TABLET | Freq: Two times a day (BID) | ORAL | 1 refills | Status: AC

## 2019-10-16 MED ORDER — DILTIAZEM HCL ER COATED BEADS 180 MG PO CP24: 180.0000 mg | ORAL_CAPSULE | Freq: Every day | ORAL | 2 refills | Status: AC

## 2019-10-16 NOTE — Progress Notes (Signed)
Subjective:  [name], female    DOB: [birth year]  Age: 85 y.o. MRN: [id number]  CC: Hospitalization Follow-up (10/21 AP- altered mental status)   HPI [name] presents for follow up of hospital stay 2 weeks ago. She was admitted 10/27  And DCed on 10/31. She was diagnosed with metabolic encephalopathy. She was hydrated, then diuresed. She had a UTI. She also has atrial fib with RVR that was treated by cardiology with the addition of diltiazem.  Today she is swelling, but not having dyspnea or chest pain. She has mild nausea, but it is much better since discontinuing hydralazine.  Depression screen [facility] 2/9 10/16/2019 10/13/2019 10/13/2019  Decreased Interest 0 0 0  Down, Depressed, Hopeless 0 0 0  PHQ - 2 Score 0 0 0  Altered sleeping - - -  Tired, decreased energy - - -  Change in appetite - - -  Feeling bad or failure about yourself  - - -  Trouble concentrating - - -  Moving slowly or fidgety/restless - - -  Suicidal thoughts - - -  PHQ-9 Score - - -  Difficult doing work/chores - - -  Some recent data might be hidden    History Lysandra has a past medical history of Anemia, Anxiety, Aortic stenosis, Arthritis, Asthma, Atrial fibrillation ([place]), Balance problems, CAD (coronary artery disease), Carotid stenosis, CHF (congestive heart failure) ([place]), CKD (chronic kidney disease), CVA (cerebral infarction), Dementia ([place]), Depression, Diabetes mellitus, GERD (gastroesophageal reflux disease), Hypertension, Iron deficiency anemia (05/27/2011), Myocardial infarction [facility]), Renal insufficiency, Scoliosis, Vertigo, and Vision loss.   She has a past surgical history that includes I & D of [place] (April 2013); Coronary artery bypass graft (2006); Coronary angioplasty; Total abdominal hysterectomy (~85 years old); Cholecystectomy; Appendectomy; Cataract extraction (Bilateral, 5 years ago); Total knee arthroplasty (Right, 08/13/2015); Knee surgery (Right); Colonoscopy  (2005); Colonoscopy with propofol (N/A, 02/14/2019); polypectomy (02/14/2019); PACEMAKER IMPLANT (N/A, 07/24/2019); and Pacemaker insertion.   Her family history includes Cancer in her brother; Diabetes in her sister and sister; Early death in her sister and son; Heart attack in her brother, brother, sister, and son; Heart attack (age of onset: 43) in her daughter; Heart disease in her brother, brother, and brother; Hypertension in her sister; Osteoporosis in her sister; Pancreatitis in her son.She reports that she has never smoked. She has never used smokeless tobacco. She reports that she does not drink alcohol or use drugs.    ROS Review of Systems  Constitutional: Positive for activity change and fatigue. Negative for fever and unexpected weight change.  HENT: Negative for congestion.   Eyes: Negative for visual disturbance.  Respiratory: Negative for shortness of breath.   Cardiovascular: Positive for leg swelling. Negative for chest pain.  Gastrointestinal: Negative for abdominal pain, constipation (two normal BMs in the last 3 days.), diarrhea, nausea and vomiting.  Genitourinary: Negative for difficulty urinating.  Musculoskeletal: Negative for arthralgias and myalgias.  Neurological: Negative for headaches.  Psychiatric/Behavioral: Negative for sleep disturbance.    Objective:  BP (!) 159/98    Pulse (!) 108    Temp (!) 97.5 F (36.4 C) (Temporal)    SpO2 93%   BP Readings from Last 3 Encounters:  10/16/19 (!) 159/98  10/07/19 138/72  09/27/19 135/65    Wt Readings from Last 3 Encounters:  10/07/19 149 lb 0.5 oz (67.6 kg)  09/26/19 149 lb 14.6 oz (68 kg)  09/21/19 156 lb (70.8 kg)     Physical Exam  Constitutional:      General: She is not in acute distress.    Appearance: She is well-developed.  HENT:     Head: Normocephalic and atraumatic.     Right Ear: External ear normal.     Left Ear: Tympanic membrane and external ear normal.     Nose: Nose normal.  Eyes:      Conjunctiva/sclera: Conjunctivae normal.     Pupils: Pupils are equal, round, and reactive to light.  Neck:     Musculoskeletal: Normal range of motion and neck supple.     Thyroid: No thyromegaly.  Cardiovascular:     Rate and Rhythm: Tachycardia present. Rhythm irregularly irregular.     Pulses:          Popliteal pulses are 3+ on the left side.     Heart sounds: No murmur. No gallop.   Pulmonary:     Effort: Pulmonary effort is normal. No respiratory distress.     Breath sounds: Normal breath sounds. No rhonchi or rales.  Abdominal:     General: Bowel sounds are normal. There is no distension.     Palpations: Abdomen is soft.     Tenderness: There is no abdominal tenderness.  Musculoskeletal:     Comments: Wheelchair bound.  Lymphadenopathy:     Cervical: No cervical adenopathy.  Skin:    General: Skin is warm and dry.  Neurological:     Mental Status: She is alert and oriented to person, place, and time.     Deep Tendon Reflexes: Reflexes are normal and symmetric.  Psychiatric:        Behavior: Behavior normal.        Thought Content: Thought content normal.        Judgment: Judgment normal.       Assessment & Plan:   [place] was seen today for hospitalization follow-up.  Diagnoses and all orders for this visit:  Diastolic CHF, chronic (HCC) -     CBC -     CMP -     Brain natriuretic peptide  Generalized edema -     CBC -     CMP -     Brain natriuretic peptide  UTI symptoms -     CBC -     CMP  Atrial fibrillation with rapid ventricular response (HCC) -     CBC -     CMP -     Brain natriuretic peptide -     urinalysis- dip and micro -     Urine culture  Other orders -     diltiazem (CARDIZEM CD) 180 MG 24 hr capsule; Take 1 capsule (180 mg total) by mouth daily. -     methenamine (MANDELAMINE) 1 g tablet; Take 1 tablet (1,000 mg total) by mouth 2 (two) times daily.       I have changed [name]'s diltiazem. I am also having her  start on methenamine. Additionally, I am having her maintain her Multiple Vitamins-Iron (DAILY VITAMINS/IRON/BETA CAROT PO), atorvastatin, ferrous sulfate, albuterol, Clear Eyes for Dry Eyes, apixaban, ramipril, metoprolol succinate, furosemide, docusate sodium, magnesium hydroxide, polyethylene glycol, insulin lispro, PARoxetine, nitroGLYCERIN, esomeprazole, Levemir FlexTouch, and clotrimazole-betamethasone.  Allergies as of 10/16/2019      Reactions   Advil [ibuprofen] Swelling   Heparin Other (See Comments)   Confusion, hallucinations.    Propoxyphene N-acetaminophen Other (See Comments)   Numbness all over. "floating" sensation.   Acetaminophen Rash      Medication List  Accurate as of October 16, 2019 12:58 PM. If you have any questions, ask your nurse or doctor.        albuterol 108 (90 Base) MCG/ACT inhaler Commonly known as: VENTOLIN HFA Inhale 2 puffs into the lungs every 6 (six) hours as needed for wheezing or shortness of breath.   apixaban 2.5 MG Tabs tablet Commonly known as: ELIQUIS Take 1 tablet (2.5 mg total) by mouth 2 (two) times daily.   atorvastatin 20 MG tablet Commonly known as: LIPITOR Take 1 tablet (20 mg total) by mouth at bedtime.   Clear Eyes for Dry Eyes 1-0.25 % Soln Generic drug: Carboxymethylcellul-Glycerin Place 1 drop into both eyes daily as needed (irritation).   clotrimazole-betamethasone cream Commonly known as: LOTRISONE Apply 1 application topically 2 (two) times daily.   DAILY VITAMINS/IRON/BETA CAROT PO Take 1 tablet by mouth at bedtime.   diltiazem 180 MG 24 hr capsule Commonly known as: CARDIZEM CD Take 1 capsule (180 mg total) by mouth daily. What changed:   medication strength  how much to take Changed by: [name], MD   docusate sodium 100 MG capsule Commonly known as: COLACE Take 1 capsule (100 mg total) by mouth 2 (two) times daily.   esomeprazole 40 MG capsule Commonly known as: NEXIUM TAKE 1 CAPSULE BY  MOUTH ONCE DAILY.   ferrous sulfate 324 (65 Fe) MG Tbec Take 324 mg by mouth every morning.   furosemide 40 MG tablet Commonly known as: LASIX Take 2 tablets (80 mg total) by mouth daily. What changed:   how much to take  when to take this   insulin lispro 100 UNIT/ML injection Commonly known as: HUMALOG Check glucose before each meal (TID). If over 200 take 5 units. If over 300 take 8 units. If over 400 take 10 units.   Levemir FlexTouch 100 UNIT/ML Pen Generic drug: Insulin Detemir INJECT 16 UNITS daily   magnesium hydroxide 400 MG/5ML suspension Commonly known as: MILK OF MAGNESIA Take 30 mLs by mouth daily as needed for moderate constipation.   methenamine 1 g tablet Commonly known as: MANDELAMINE Take 1 tablet (1,000 mg total) by mouth 2 (two) times daily. Started by: [name], MD   metoprolol succinate 100 MG 24 hr tablet Commonly known as: TOPROL-XL Take 1 tablet (100 mg total) by mouth daily. For blood pressure control   nitroGLYCERIN 0.4 MG SL tablet Commonly known as: NITROSTAT PLACE 1 TAB UNDER TONGUE EVERY 3 MINUTES AS DIRECTED UP TO 3 TIMES ASNEEDED FOR CHEST PAIN.   PARoxetine 20 MG tablet Commonly known as: PAXIL TAKE 1 TABLET BY MOUTH EVERY MORNING.   polyethylene glycol 17 g packet Commonly known as: MiraLax Take 17 g by mouth 2 (two) times daily.   ramipril 5 MG capsule Commonly known as: ALTACE Take 1 capsule (5 mg total) by mouth daily.        Follow-up: Return in about 1 week (around 10/23/2019) for hypertension, A fib, edema.  [name], M.D.

## 2019-10-17 DIAGNOSIS — L84 Corns and callosities: Secondary | ICD-10-CM | POA: Diagnosis not present

## 2019-10-17 DIAGNOSIS — B351 Tinea unguium: Secondary | ICD-10-CM | POA: Diagnosis not present

## 2019-10-17 DIAGNOSIS — E1142 Type 2 diabetes mellitus with diabetic polyneuropathy: Secondary | ICD-10-CM | POA: Diagnosis not present

## 2019-10-17 DIAGNOSIS — M79676 Pain in unspecified toe(s): Secondary | ICD-10-CM | POA: Diagnosis not present

## 2019-10-17 LAB — CBC WITH DIFFERENTIAL/PLATELET
Basophils Absolute: 0 10*3/uL (ref 0.0–0.2)
Basos: 1 %
EOS (ABSOLUTE): 0 10*3/uL (ref 0.0–0.4)
Eos: 1 %
Hematocrit: 38.2 % (ref 34.0–46.6)
Hemoglobin: 12 g/dL (ref 11.1–15.9)
Immature Grans (Abs): 0 10*3/uL (ref 0.0–0.1)
Immature Granulocytes: 0 %
Lymphocytes Absolute: 0.7 10*3/uL (ref 0.7–3.1)
Lymphs: 12 %
MCH: 27.1 pg (ref 26.6–33.0)
MCHC: 31.4 g/dL — ABNORMAL LOW (ref 31.5–35.7)
MCV: 86 fL (ref 79–97)
Monocytes Absolute: 0.3 10*3/uL (ref 0.1–0.9)
Monocytes: 5 %
Neutrophils Absolute: 4.7 10*3/uL (ref 1.4–7.0)
Neutrophils: 81 %
Platelets: 142 10*3/uL — ABNORMAL LOW (ref 150–450)
RBC: 4.42 x10E6/uL (ref 3.77–5.28)
RDW: 14.2 % (ref 11.7–15.4)
WBC: 5.7 10*3/uL (ref 3.4–10.8)

## 2019-10-17 LAB — BRAIN NATRIURETIC PEPTIDE: BNP: 2016.5 pg/mL — ABNORMAL HIGH (ref 0.0–100.0)

## 2019-10-17 LAB — CMP14+EGFR
ALT: 15 IU/L (ref 0–32)
AST: 19 IU/L (ref 0–40)
Albumin/Globulin Ratio: 1.3 (ref 1.2–2.2)
Albumin: 3.6 g/dL (ref 3.6–4.6)
Alkaline Phosphatase: 61 IU/L (ref 39–117)
BUN/Creatinine Ratio: 22 (ref 12–28)
BUN: 37 mg/dL — ABNORMAL HIGH (ref 8–27)
Bilirubin Total: 0.3 mg/dL (ref 0.0–1.2)
CO2: 30 mmol/L — ABNORMAL HIGH (ref 20–29)
Calcium: 9.5 mg/dL (ref 8.7–10.3)
Chloride: 97 mmol/L (ref 96–106)
Creatinine, Ser: 1.72 mg/dL — ABNORMAL HIGH (ref 0.57–1.00)
GFR calc Af Amer: 31 mL/min/{1.73_m2} — ABNORMAL LOW (ref >59)
GFR calc non Af Amer: 27 mL/min/{1.73_m2} — ABNORMAL LOW (ref >59)
Globulin, Total: 2.8 g/dL (ref 1.5–4.5)
Glucose: 318 mg/dL — ABNORMAL HIGH (ref 65–99)
Potassium: 5.1 mmol/L (ref 3.5–5.2)
Sodium: 140 mmol/L (ref 134–144)
Total Protein: 6.4 g/dL (ref 6.0–8.5)

## 2019-10-18 DIAGNOSIS — K219 Gastro-esophageal reflux disease without esophagitis: Secondary | ICD-10-CM | POA: Diagnosis not present

## 2019-10-18 DIAGNOSIS — M519 Unspecified thoracic, thoracolumbar and lumbosacral intervertebral disc disorder: Secondary | ICD-10-CM | POA: Diagnosis not present

## 2019-10-18 DIAGNOSIS — I4891 Unspecified atrial fibrillation: Secondary | ICD-10-CM | POA: Diagnosis not present

## 2019-10-18 DIAGNOSIS — I110 Hypertensive heart disease with heart failure: Secondary | ICD-10-CM | POA: Diagnosis not present

## 2019-10-18 DIAGNOSIS — M1712 Unilateral primary osteoarthritis, left knee: Secondary | ICD-10-CM | POA: Diagnosis not present

## 2019-10-18 DIAGNOSIS — I2510 Atherosclerotic heart disease of native coronary artery without angina pectoris: Secondary | ICD-10-CM | POA: Diagnosis not present

## 2019-10-18 DIAGNOSIS — J45909 Unspecified asthma, uncomplicated: Secondary | ICD-10-CM | POA: Diagnosis not present

## 2019-10-18 DIAGNOSIS — E1122 Type 2 diabetes mellitus with diabetic chronic kidney disease: Secondary | ICD-10-CM | POA: Diagnosis not present

## 2019-10-18 DIAGNOSIS — I69398 Other sequelae of cerebral infarction: Secondary | ICD-10-CM | POA: Diagnosis not present

## 2019-10-18 DIAGNOSIS — M419 Scoliosis, unspecified: Secondary | ICD-10-CM | POA: Diagnosis not present

## 2019-10-18 DIAGNOSIS — D509 Iron deficiency anemia, unspecified: Secondary | ICD-10-CM | POA: Diagnosis not present

## 2019-10-18 DIAGNOSIS — I69322 Dysarthria following cerebral infarction: Secondary | ICD-10-CM | POA: Diagnosis not present

## 2019-10-18 DIAGNOSIS — N390 Urinary tract infection, site not specified: Secondary | ICD-10-CM | POA: Diagnosis not present

## 2019-10-18 DIAGNOSIS — I252 Old myocardial infarction: Secondary | ICD-10-CM | POA: Diagnosis not present

## 2019-10-18 DIAGNOSIS — N1832 Chronic kidney disease, stage 3b: Secondary | ICD-10-CM | POA: Diagnosis not present

## 2019-10-18 DIAGNOSIS — H5462 Unqualified visual loss, left eye, normal vision right eye: Secondary | ICD-10-CM | POA: Diagnosis not present

## 2019-10-18 DIAGNOSIS — I5032 Chronic diastolic (congestive) heart failure: Secondary | ICD-10-CM | POA: Diagnosis not present

## 2019-10-18 DIAGNOSIS — M160 Bilateral primary osteoarthritis of hip: Secondary | ICD-10-CM | POA: Diagnosis not present

## 2019-10-18 DIAGNOSIS — I080 Rheumatic disorders of both mitral and aortic valves: Secondary | ICD-10-CM | POA: Diagnosis not present

## 2019-10-18 DIAGNOSIS — S51812D Laceration without foreign body of left forearm, subsequent encounter: Secondary | ICD-10-CM | POA: Diagnosis not present

## 2019-10-18 DIAGNOSIS — H9190 Unspecified hearing loss, unspecified ear: Secondary | ICD-10-CM | POA: Diagnosis not present

## 2019-10-19 DIAGNOSIS — E1122 Type 2 diabetes mellitus with diabetic chronic kidney disease: Secondary | ICD-10-CM | POA: Diagnosis not present

## 2019-10-19 DIAGNOSIS — S51812D Laceration without foreign body of left forearm, subsequent encounter: Secondary | ICD-10-CM | POA: Diagnosis not present

## 2019-10-19 DIAGNOSIS — I5032 Chronic diastolic (congestive) heart failure: Secondary | ICD-10-CM | POA: Diagnosis not present

## 2019-10-19 DIAGNOSIS — I110 Hypertensive heart disease with heart failure: Secondary | ICD-10-CM | POA: Diagnosis not present

## 2019-10-19 DIAGNOSIS — D509 Iron deficiency anemia, unspecified: Secondary | ICD-10-CM | POA: Diagnosis not present

## 2019-10-19 DIAGNOSIS — I2510 Atherosclerotic heart disease of native coronary artery without angina pectoris: Secondary | ICD-10-CM | POA: Diagnosis not present

## 2019-10-19 DIAGNOSIS — J45909 Unspecified asthma, uncomplicated: Secondary | ICD-10-CM | POA: Diagnosis not present

## 2019-10-19 DIAGNOSIS — N1832 Chronic kidney disease, stage 3b: Secondary | ICD-10-CM | POA: Diagnosis not present

## 2019-10-19 DIAGNOSIS — I69322 Dysarthria following cerebral infarction: Secondary | ICD-10-CM | POA: Diagnosis not present

## 2019-10-19 DIAGNOSIS — N390 Urinary tract infection, site not specified: Secondary | ICD-10-CM | POA: Diagnosis not present

## 2019-10-19 DIAGNOSIS — M419 Scoliosis, unspecified: Secondary | ICD-10-CM | POA: Diagnosis not present

## 2019-10-19 DIAGNOSIS — M519 Unspecified thoracic, thoracolumbar and lumbosacral intervertebral disc disorder: Secondary | ICD-10-CM | POA: Diagnosis not present

## 2019-10-19 DIAGNOSIS — I080 Rheumatic disorders of both mitral and aortic valves: Secondary | ICD-10-CM | POA: Diagnosis not present

## 2019-10-19 DIAGNOSIS — M1712 Unilateral primary osteoarthritis, left knee: Secondary | ICD-10-CM | POA: Diagnosis not present

## 2019-10-19 DIAGNOSIS — M160 Bilateral primary osteoarthritis of hip: Secondary | ICD-10-CM | POA: Diagnosis not present

## 2019-10-19 DIAGNOSIS — I69398 Other sequelae of cerebral infarction: Secondary | ICD-10-CM | POA: Diagnosis not present

## 2019-10-19 DIAGNOSIS — H9190 Unspecified hearing loss, unspecified ear: Secondary | ICD-10-CM | POA: Diagnosis not present

## 2019-10-19 DIAGNOSIS — I252 Old myocardial infarction: Secondary | ICD-10-CM | POA: Diagnosis not present

## 2019-10-19 DIAGNOSIS — I4891 Unspecified atrial fibrillation: Secondary | ICD-10-CM | POA: Diagnosis not present

## 2019-10-19 DIAGNOSIS — K219 Gastro-esophageal reflux disease without esophagitis: Secondary | ICD-10-CM | POA: Diagnosis not present

## 2019-10-19 DIAGNOSIS — H5462 Unqualified visual loss, left eye, normal vision right eye: Secondary | ICD-10-CM | POA: Diagnosis not present

## 2019-10-19 LAB — URINE CULTURE

## 2019-10-20 DIAGNOSIS — I080 Rheumatic disorders of both mitral and aortic valves: Secondary | ICD-10-CM | POA: Diagnosis not present

## 2019-10-20 DIAGNOSIS — E1122 Type 2 diabetes mellitus with diabetic chronic kidney disease: Secondary | ICD-10-CM | POA: Diagnosis not present

## 2019-10-20 DIAGNOSIS — I69398 Other sequelae of cerebral infarction: Secondary | ICD-10-CM | POA: Diagnosis not present

## 2019-10-20 DIAGNOSIS — I110 Hypertensive heart disease with heart failure: Secondary | ICD-10-CM | POA: Diagnosis not present

## 2019-10-20 DIAGNOSIS — M519 Unspecified thoracic, thoracolumbar and lumbosacral intervertebral disc disorder: Secondary | ICD-10-CM | POA: Diagnosis not present

## 2019-10-20 DIAGNOSIS — D509 Iron deficiency anemia, unspecified: Secondary | ICD-10-CM | POA: Diagnosis not present

## 2019-10-20 DIAGNOSIS — H5462 Unqualified visual loss, left eye, normal vision right eye: Secondary | ICD-10-CM | POA: Diagnosis not present

## 2019-10-20 DIAGNOSIS — N1832 Chronic kidney disease, stage 3b: Secondary | ICD-10-CM | POA: Diagnosis not present

## 2019-10-20 DIAGNOSIS — N390 Urinary tract infection, site not specified: Secondary | ICD-10-CM | POA: Diagnosis not present

## 2019-10-20 DIAGNOSIS — I2510 Atherosclerotic heart disease of native coronary artery without angina pectoris: Secondary | ICD-10-CM | POA: Diagnosis not present

## 2019-10-20 DIAGNOSIS — I5032 Chronic diastolic (congestive) heart failure: Secondary | ICD-10-CM | POA: Diagnosis not present

## 2019-10-20 DIAGNOSIS — H9190 Unspecified hearing loss, unspecified ear: Secondary | ICD-10-CM | POA: Diagnosis not present

## 2019-10-20 DIAGNOSIS — I69322 Dysarthria following cerebral infarction: Secondary | ICD-10-CM | POA: Diagnosis not present

## 2019-10-20 DIAGNOSIS — J45909 Unspecified asthma, uncomplicated: Secondary | ICD-10-CM | POA: Diagnosis not present

## 2019-10-20 DIAGNOSIS — M1712 Unilateral primary osteoarthritis, left knee: Secondary | ICD-10-CM | POA: Diagnosis not present

## 2019-10-20 DIAGNOSIS — I252 Old myocardial infarction: Secondary | ICD-10-CM | POA: Diagnosis not present

## 2019-10-20 DIAGNOSIS — M160 Bilateral primary osteoarthritis of hip: Secondary | ICD-10-CM | POA: Diagnosis not present

## 2019-10-20 DIAGNOSIS — I4891 Unspecified atrial fibrillation: Secondary | ICD-10-CM | POA: Diagnosis not present

## 2019-10-20 DIAGNOSIS — S51812D Laceration without foreign body of left forearm, subsequent encounter: Secondary | ICD-10-CM | POA: Diagnosis not present

## 2019-10-20 DIAGNOSIS — K219 Gastro-esophageal reflux disease without esophagitis: Secondary | ICD-10-CM | POA: Diagnosis not present

## 2019-10-20 DIAGNOSIS — M419 Scoliosis, unspecified: Secondary | ICD-10-CM | POA: Diagnosis not present

## 2019-10-21 DIAGNOSIS — E1122 Type 2 diabetes mellitus with diabetic chronic kidney disease: Secondary | ICD-10-CM | POA: Diagnosis not present

## 2019-10-21 DIAGNOSIS — I110 Hypertensive heart disease with heart failure: Secondary | ICD-10-CM | POA: Diagnosis not present

## 2019-10-21 DIAGNOSIS — M1712 Unilateral primary osteoarthritis, left knee: Secondary | ICD-10-CM | POA: Diagnosis not present

## 2019-10-21 DIAGNOSIS — M419 Scoliosis, unspecified: Secondary | ICD-10-CM | POA: Diagnosis not present

## 2019-10-21 DIAGNOSIS — M160 Bilateral primary osteoarthritis of hip: Secondary | ICD-10-CM | POA: Diagnosis not present

## 2019-10-21 DIAGNOSIS — J45909 Unspecified asthma, uncomplicated: Secondary | ICD-10-CM | POA: Diagnosis not present

## 2019-10-21 DIAGNOSIS — I080 Rheumatic disorders of both mitral and aortic valves: Secondary | ICD-10-CM | POA: Diagnosis not present

## 2019-10-21 DIAGNOSIS — I69398 Other sequelae of cerebral infarction: Secondary | ICD-10-CM | POA: Diagnosis not present

## 2019-10-21 DIAGNOSIS — I5032 Chronic diastolic (congestive) heart failure: Secondary | ICD-10-CM | POA: Diagnosis not present

## 2019-10-21 DIAGNOSIS — H5462 Unqualified visual loss, left eye, normal vision right eye: Secondary | ICD-10-CM | POA: Diagnosis not present

## 2019-10-21 DIAGNOSIS — I252 Old myocardial infarction: Secondary | ICD-10-CM | POA: Diagnosis not present

## 2019-10-21 DIAGNOSIS — D509 Iron deficiency anemia, unspecified: Secondary | ICD-10-CM | POA: Diagnosis not present

## 2019-10-21 DIAGNOSIS — I2510 Atherosclerotic heart disease of native coronary artery without angina pectoris: Secondary | ICD-10-CM | POA: Diagnosis not present

## 2019-10-21 DIAGNOSIS — N1832 Chronic kidney disease, stage 3b: Secondary | ICD-10-CM | POA: Diagnosis not present

## 2019-10-21 DIAGNOSIS — M519 Unspecified thoracic, thoracolumbar and lumbosacral intervertebral disc disorder: Secondary | ICD-10-CM | POA: Diagnosis not present

## 2019-10-21 DIAGNOSIS — K219 Gastro-esophageal reflux disease without esophagitis: Secondary | ICD-10-CM | POA: Diagnosis not present

## 2019-10-21 DIAGNOSIS — S51812D Laceration without foreign body of left forearm, subsequent encounter: Secondary | ICD-10-CM | POA: Diagnosis not present

## 2019-10-21 DIAGNOSIS — I69322 Dysarthria following cerebral infarction: Secondary | ICD-10-CM | POA: Diagnosis not present

## 2019-10-21 DIAGNOSIS — H9190 Unspecified hearing loss, unspecified ear: Secondary | ICD-10-CM | POA: Diagnosis not present

## 2019-10-21 DIAGNOSIS — N390 Urinary tract infection, site not specified: Secondary | ICD-10-CM | POA: Diagnosis not present

## 2019-10-21 DIAGNOSIS — I4891 Unspecified atrial fibrillation: Secondary | ICD-10-CM | POA: Diagnosis not present

## 2019-10-22 DIAGNOSIS — I080 Rheumatic disorders of both mitral and aortic valves: Secondary | ICD-10-CM | POA: Diagnosis not present

## 2019-10-22 DIAGNOSIS — E1122 Type 2 diabetes mellitus with diabetic chronic kidney disease: Secondary | ICD-10-CM | POA: Diagnosis not present

## 2019-10-22 DIAGNOSIS — I69398 Other sequelae of cerebral infarction: Secondary | ICD-10-CM | POA: Diagnosis not present

## 2019-10-22 DIAGNOSIS — I2510 Atherosclerotic heart disease of native coronary artery without angina pectoris: Secondary | ICD-10-CM | POA: Diagnosis not present

## 2019-10-22 DIAGNOSIS — M419 Scoliosis, unspecified: Secondary | ICD-10-CM | POA: Diagnosis not present

## 2019-10-22 DIAGNOSIS — D509 Iron deficiency anemia, unspecified: Secondary | ICD-10-CM | POA: Diagnosis not present

## 2019-10-22 DIAGNOSIS — H9190 Unspecified hearing loss, unspecified ear: Secondary | ICD-10-CM | POA: Diagnosis not present

## 2019-10-22 DIAGNOSIS — H5462 Unqualified visual loss, left eye, normal vision right eye: Secondary | ICD-10-CM | POA: Diagnosis not present

## 2019-10-22 DIAGNOSIS — J90 Pleural effusion, not elsewhere classified: Secondary | ICD-10-CM | POA: Insufficient documentation

## 2019-10-22 DIAGNOSIS — I110 Hypertensive heart disease with heart failure: Secondary | ICD-10-CM | POA: Diagnosis not present

## 2019-10-22 DIAGNOSIS — K219 Gastro-esophageal reflux disease without esophagitis: Secondary | ICD-10-CM | POA: Diagnosis not present

## 2019-10-22 DIAGNOSIS — S51812D Laceration without foreign body of left forearm, subsequent encounter: Secondary | ICD-10-CM | POA: Diagnosis not present

## 2019-10-22 DIAGNOSIS — M160 Bilateral primary osteoarthritis of hip: Secondary | ICD-10-CM | POA: Diagnosis not present

## 2019-10-22 DIAGNOSIS — M1712 Unilateral primary osteoarthritis, left knee: Secondary | ICD-10-CM | POA: Diagnosis not present

## 2019-10-22 DIAGNOSIS — J45909 Unspecified asthma, uncomplicated: Secondary | ICD-10-CM | POA: Diagnosis not present

## 2019-10-22 DIAGNOSIS — I4891 Unspecified atrial fibrillation: Secondary | ICD-10-CM | POA: Diagnosis not present

## 2019-10-22 DIAGNOSIS — M519 Unspecified thoracic, thoracolumbar and lumbosacral intervertebral disc disorder: Secondary | ICD-10-CM | POA: Diagnosis not present

## 2019-10-22 DIAGNOSIS — I252 Old myocardial infarction: Secondary | ICD-10-CM | POA: Diagnosis not present

## 2019-10-22 DIAGNOSIS — N1832 Chronic kidney disease, stage 3b: Secondary | ICD-10-CM | POA: Diagnosis not present

## 2019-10-22 DIAGNOSIS — N390 Urinary tract infection, site not specified: Secondary | ICD-10-CM | POA: Diagnosis not present

## 2019-10-22 DIAGNOSIS — I5032 Chronic diastolic (congestive) heart failure: Secondary | ICD-10-CM | POA: Diagnosis not present

## 2019-10-22 DIAGNOSIS — I69322 Dysarthria following cerebral infarction: Secondary | ICD-10-CM | POA: Diagnosis not present

## 2019-10-22 NOTE — Progress Notes (Signed)
HPI The patient presents for followup of CAD, atrial fib and bradycardia.   She was admitted in late August with tachybrady and she had a pacemaker placed.  She did have atrial fib on device interrogation recently.  I saw her and we started anticoagulation.   At the last visit she had severe pain from hemorrhoids.  She was admitted in late October with AMS.  I reviewed these records for this visit.    She had dehydration.   She had a UTI.  She was treated for atrial fib with RVR.  She also had a pulmonary nodule.    Today she looks better than she did when I last saw her.  She has less abdominal pain although she still complaining of constipation.  Her daughter says she has a little bit of confusion since the hospital but this is slightly improved.  Her breathing is slightly improved.  She is not having any new PND or orthopnea.  She is not having any new palpitations, presyncope or syncope.  She has had left lower extremity swelling.   Allergies  Allergen Reactions  . Advil [Ibuprofen] Swelling  . Heparin Other (See Comments)    Confusion, hallucinations.   . Propoxyphene N-Acetaminophen Other (See Comments)    Numbness all over. "floating" sensation.  . Acetaminophen Rash    Current Outpatient Medications  Medication Sig Dispense Refill  . albuterol (PROVENTIL HFA;VENTOLIN HFA) 108 (90 Base) MCG/ACT inhaler Inhale 2 puffs into the lungs every 6 (six) hours as needed for wheezing or shortness of breath. 1 Inhaler 5  . apixaban (ELIQUIS) 2.5 MG TABS tablet Take 1 tablet (2.5 mg total) by mouth 2 (two) times daily. 60 tablet 11  . atorvastatin (LIPITOR) 20 MG tablet Take 1 tablet (20 mg total) by mouth at bedtime. 90 tablet 3  . Carboxymethylcellul-Glycerin (CLEAR EYES FOR DRY EYES) 1-0.25 % SOLN Place 1 drop into both eyes daily as needed (irritation).     . clotrimazole-betamethasone (LOTRISONE) cream Apply 1 application topically 2 (two) times daily. 30 g 0  . diltiazem (CARDIZEM CD)  180 MG 24 hr capsule Take 1 capsule (180 mg total) by mouth daily. 30 capsule 2  . docusate sodium (COLACE) 100 MG capsule Take 1 capsule (100 mg total) by mouth 2 (two) times daily. 60 capsule 0  . esomeprazole (NEXIUM) 40 MG capsule TAKE 1 CAPSULE BY MOUTH ONCE DAILY. 30 capsule 0  . ferrous sulfate 324 (65 Fe) MG TBEC Take 324 mg by mouth every morning.     . furosemide (LASIX) 20 MG tablet Take 20 mg by mouth 3 (three) times daily.    . Insulin Detemir (LEVEMIR FLEXTOUCH) 100 UNIT/ML Pen INJECT 16 UNITS daily 9 mL 2  . insulin lispro (HUMALOG) 100 UNIT/ML injection Check glucose before each meal (TID). If over 200 take 5 units. If over 300 take 8 units. If over 400 take 10 units. 10 mL 11  . levofloxacin (LEVAQUIN) 250 MG tablet Take 1 tablet (250 mg total) by mouth daily. 7 tablet 0  . magnesium hydroxide (MILK OF MAGNESIA) 400 MG/5ML suspension Take 30 mLs by mouth daily as needed for moderate constipation.    . methenamine (MANDELAMINE) 1 g tablet Take 1 tablet (1,000 mg total) by mouth 2 (two) times daily. 180 tablet 1  . metoprolol succinate (TOPROL-XL) 100 MG 24 hr tablet Take 1 tablet (100 mg total) by mouth daily. For blood pressure control 30 tablet 1  . Multiple Vitamins-Iron (  DAILY VITAMINS/IRON/BETA CAROT PO) Take 1 tablet by mouth at bedtime.    . nitroGLYCERIN (NITROSTAT) 0.4 MG SL tablet PLACE 1 TAB UNDER TONGUE EVERY 3 MINUTES AS DIRECTED UP TO 3 TIMES ASNEEDED FOR CHEST PAIN. 25 tablet 0  . PARoxetine (PAXIL) 20 MG tablet TAKE 1 TABLET BY MOUTH EVERY MORNING. 30 tablet 0  . polyethylene glycol (MIRALAX) 17 g packet Take 17 g by mouth 2 (two) times daily. 100 each 1  . ramipril (ALTACE) 5 MG capsule Take 1 capsule (5 mg total) by mouth daily. 90 capsule 3   No current facility-administered medications for this visit.     Past Medical History:  Diagnosis Date  . Anemia   . Anxiety   . Aortic stenosis   . Arthritis    back, knees, and hips  . Asthma    with allergies  .  Atrial fibrillation ([place])   . Balance problems   . CAD (coronary artery disease)   . Carotid stenosis   . CHF (congestive heart failure) ([place])    EF preserved Echo 2012  . CKD (chronic kidney disease)   . 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 Dementia ([place])    "a little"  . Depression   . Diabetes mellitus    type 2  . GERD (gastroesophageal reflux disease)   . Hypertension   . Iron deficiency anemia 05/27/2011  . Myocardial infarction ([place])   . Renal insufficiency   . Scoliosis   . Vertigo    "when sugar gets low"  . Vision loss    left eye-"half blind"    Past Surgical History:  Procedure Laterality Date  . APPENDECTOMY     with hysterectomy  . CATARACT EXTRACTION Bilateral 5 years ago  . CHOLECYSTECTOMY    . COLONOSCOPY  2005   Dr. Amedeo Plenty: Diverticulosis  . COLONOSCOPY WITH PROPOFOL N/A 02/14/2019   Procedure: COLONOSCOPY WITH PROPOFOL;  Surgeon: Danie Binder, MD;  Location: [facility];  Service: Endoscopy;  Laterality: N/A;  . CORONARY ANGIOPLASTY     prior to 2006 5 stents  . CORONARY ARTERY BYPASS GRAFT  2006  . I & D of [place]  April 2013  . KNEE SURGERY Right   . PACEMAKER IMPLANT N/A 07/24/2019   Procedure: PACEMAKER IMPLANT;  Surgeon: Evans Lance, MD;  Location: [facility];  Service: Cardiovascular;  Laterality: N/A;  . PACEMAKER INSERTION    . POLYPECTOMY  02/14/2019   Procedure: POLYPECTOMY;  Surgeon: Danie Binder, MD;  Location: [facility];  Service: Endoscopy;;  cecal polyp  . TOTAL ABDOMINAL HYSTERECTOMY  ~85 years old   complete, with tumor removal  . TOTAL KNEE ARTHROPLASTY Right 08/13/2015   Procedure: RIGHT  TOTAL KNEE ARTHROPLASTY;  Surgeon: Paralee Cancel, MD;  Location: [facility];  Service: Orthopedics;  Laterality: Right;    ROS: Positive for mild confusion.  Otherwise as stated in the HPI and negative for all other systems.  PHYSICAL EXAM BP  (!) 145/89   Pulse 97   Ht 5\' 6"  (1.676 m)   Wt 149 lb (67.6 kg)   SpO2 95%   BMI 24.05 kg/m   GEN:  No distress, she is slightly frail appearing NECK:  No jugular venous distention at 90 degrees, waveform within normal limits, carotid upstroke brisk and symmetric, no bruits, no thyromegaly LYMPHATICS:  No cervical adenopathy LUNGS:  Clear  to auscultation bilaterally BACK:  No CVA tenderness CHEST:  Unremarkable HEART:  S1 and S2 within normal limits, no S3, no clicks, no rubs, no obvious murmurs, distant heart sounds, irregular ABD:  Positive bowel sounds normal in frequency in pitch, no bruits, no rebound, no guarding, unable to assess midline mass or bruit with the patient seated. EXT:  2 plus pulses throughout, mild  edema, no cyanosis no clubbing SKIN:  No rashes no nodules NEURO:  Cranial nerves II through XII grossly intact, motor grossly intact throughout PSYCH:  Cognitively intact, oriented to person place and time   EKG:   NA  Lab Results  Component Value Date   CHOL 150 10/18/2018   TRIG 130 10/18/2018   HDL 82 10/18/2018   LDLCALC 42 10/18/2018   Lab Results  Component Value Date   CREATININE 1.72 (H) 10/16/2019    ASSESSMENT AND PLAN  ATRIAL FIB:  Ms. [name] has a CHA2DS2 - VASc score of 8.   Tolerates anticoagulation.  No change in therapy.  CAD: She is having no angina.  No change in therapy.    AS: This was moderate last July on echo.  No change in therapy.  No further imaging at this point.  HTN: Her blood pressure is slightly elevated today but this is unusual.  She will continue the meds as listed.  DM:  Her A1C is up to 8.5 from 6.8.  I will defer to [name], MD  DYSLIPIDEMIA:  LDL was 42.  She will continue the meds as listed.   CKD:  Creat was 1.72.  This is stable and can be followed.    HEMORRHOIDS: I will defer to Dr. Livia Snellen.  She might need referral to a surgeon though she be somewhat high risk.  I do think the risk would be  worthwhile since her hemorrhoids are still bothersome and are going to leave multiple other problems.  I had this conversation via text today with Dr. Livia Snellen.  PLEURAL EFFUSION:    There is evidence of increased right greater than left effusion and a lung mass could not be exlcuded.  She will probably need further imaging and possibly pulmonary referral.  We discussed this and she is going to see Dr. Livia Snellen after Thanksgiving.  I will defer to his management.

## 2019-10-23 ENCOUNTER — Other Ambulatory Visit: Payer: Self-pay

## 2019-10-23 ENCOUNTER — Encounter: Payer: Self-pay | Admitting: Cardiology

## 2019-10-23 ENCOUNTER — Other Ambulatory Visit: Payer: Self-pay | Admitting: Family Medicine

## 2019-10-23 ENCOUNTER — Ambulatory Visit (INDEPENDENT_AMBULATORY_CARE_PROVIDER_SITE_OTHER): Payer: Medicare Other | Admitting: Cardiology

## 2019-10-23 VITALS — BP 145/89 | HR 97 | Ht 66.0 in | Wt 149.0 lb

## 2019-10-23 DIAGNOSIS — I10 Essential (primary) hypertension: Secondary | ICD-10-CM

## 2019-10-23 DIAGNOSIS — J90 Pleural effusion, not elsewhere classified: Secondary | ICD-10-CM

## 2019-10-23 DIAGNOSIS — K642 Third degree hemorrhoids: Secondary | ICD-10-CM

## 2019-10-23 DIAGNOSIS — E785 Hyperlipidemia, unspecified: Secondary | ICD-10-CM | POA: Diagnosis not present

## 2019-10-23 DIAGNOSIS — I4891 Unspecified atrial fibrillation: Secondary | ICD-10-CM | POA: Diagnosis not present

## 2019-10-23 DIAGNOSIS — N1832 Chronic kidney disease, stage 3b: Secondary | ICD-10-CM

## 2019-10-23 NOTE — Patient Instructions (Addendum)
Medication Instructions:  Your physician recommends that you continue on your current medications as directed. Please refer to the Current Medication list given to you today.  If you need a refill on your cardiac medications before your next appointment, please call your pharmacy.   Lab work: NONE  Testing/Procedures: NONE  Follow-Up: At [facility], you and your health needs are our priority.  As part of our continuing mission to provide you with exceptional heart care, we have created designated Provider Care Teams.  These Care Teams include your primary Cardiologist (physician) and Advanced Practice Providers (APPs -  Physician Assistants and Nurse Practitioners) who all work together to provide you with the care you need, when you need it. You may see Minus Breeding, MD or one of the following Advanced Practice Providers on your designated Care Team:    Rosaria Ferries, PA-C  Jory Sims, DNP, ANP  Cadence Kathlen Mody, NP   Your physician wants you to follow-up in: 4 months at the [facility] office.

## 2019-10-24 ENCOUNTER — Ambulatory Visit (INDEPENDENT_AMBULATORY_CARE_PROVIDER_SITE_OTHER): Payer: Medicare Other

## 2019-10-24 DIAGNOSIS — Z6830 Body mass index (BMI) 30.0-30.9, adult: Secondary | ICD-10-CM

## 2019-10-24 DIAGNOSIS — I252 Old myocardial infarction: Secondary | ICD-10-CM

## 2019-10-24 DIAGNOSIS — F411 Generalized anxiety disorder: Secondary | ICD-10-CM

## 2019-10-24 DIAGNOSIS — I5032 Chronic diastolic (congestive) heart failure: Secondary | ICD-10-CM | POA: Diagnosis not present

## 2019-10-24 DIAGNOSIS — E782 Mixed hyperlipidemia: Secondary | ICD-10-CM

## 2019-10-24 DIAGNOSIS — E1122 Type 2 diabetes mellitus with diabetic chronic kidney disease: Secondary | ICD-10-CM | POA: Diagnosis not present

## 2019-10-24 DIAGNOSIS — S51812D Laceration without foreign body of left forearm, subsequent encounter: Secondary | ICD-10-CM | POA: Diagnosis not present

## 2019-10-24 DIAGNOSIS — M419 Scoliosis, unspecified: Secondary | ICD-10-CM

## 2019-10-24 DIAGNOSIS — H5462 Unqualified visual loss, left eye, normal vision right eye: Secondary | ICD-10-CM

## 2019-10-24 DIAGNOSIS — N1832 Chronic kidney disease, stage 3b: Secondary | ICD-10-CM | POA: Diagnosis not present

## 2019-10-24 DIAGNOSIS — N390 Urinary tract infection, site not specified: Secondary | ICD-10-CM

## 2019-10-24 DIAGNOSIS — F329 Major depressive disorder, single episode, unspecified: Secondary | ICD-10-CM

## 2019-10-24 DIAGNOSIS — I110 Hypertensive heart disease with heart failure: Secondary | ICD-10-CM

## 2019-10-24 DIAGNOSIS — Z96651 Presence of right artificial knee joint: Secondary | ICD-10-CM

## 2019-10-24 DIAGNOSIS — F0150 Vascular dementia without behavioral disturbance: Secondary | ICD-10-CM

## 2019-10-24 DIAGNOSIS — K219 Gastro-esophageal reflux disease without esophagitis: Secondary | ICD-10-CM

## 2019-10-24 DIAGNOSIS — M1712 Unilateral primary osteoarthritis, left knee: Secondary | ICD-10-CM

## 2019-10-24 DIAGNOSIS — M160 Bilateral primary osteoarthritis of hip: Secondary | ICD-10-CM

## 2019-10-24 DIAGNOSIS — I69398 Other sequelae of cerebral infarction: Secondary | ICD-10-CM

## 2019-10-24 DIAGNOSIS — D509 Iron deficiency anemia, unspecified: Secondary | ICD-10-CM

## 2019-10-24 DIAGNOSIS — I69322 Dysarthria following cerebral infarction: Secondary | ICD-10-CM

## 2019-10-24 DIAGNOSIS — Z951 Presence of aortocoronary bypass graft: Secondary | ICD-10-CM

## 2019-10-24 DIAGNOSIS — M519 Unspecified thoracic, thoracolumbar and lumbosacral intervertebral disc disorder: Secondary | ICD-10-CM

## 2019-10-24 DIAGNOSIS — Z7901 Long term (current) use of anticoagulants: Secondary | ICD-10-CM

## 2019-10-24 DIAGNOSIS — Z950 Presence of cardiac pacemaker: Secondary | ICD-10-CM

## 2019-10-24 DIAGNOSIS — J45909 Unspecified asthma, uncomplicated: Secondary | ICD-10-CM

## 2019-10-24 DIAGNOSIS — Z86010 Personal history of colonic polyps: Secondary | ICD-10-CM

## 2019-10-24 DIAGNOSIS — Z9181 History of falling: Secondary | ICD-10-CM

## 2019-10-24 DIAGNOSIS — I080 Rheumatic disorders of both mitral and aortic valves: Secondary | ICD-10-CM

## 2019-10-24 DIAGNOSIS — I2510 Atherosclerotic heart disease of native coronary artery without angina pectoris: Secondary | ICD-10-CM

## 2019-10-24 DIAGNOSIS — K5900 Constipation, unspecified: Secondary | ICD-10-CM

## 2019-10-24 DIAGNOSIS — Z794 Long term (current) use of insulin: Secondary | ICD-10-CM

## 2019-10-24 DIAGNOSIS — E669 Obesity, unspecified: Secondary | ICD-10-CM

## 2019-10-24 DIAGNOSIS — H9190 Unspecified hearing loss, unspecified ear: Secondary | ICD-10-CM

## 2019-10-24 DIAGNOSIS — I4891 Unspecified atrial fibrillation: Secondary | ICD-10-CM

## 2019-10-26 ENCOUNTER — Emergency Department (HOSPITAL_COMMUNITY): Payer: Medicare Other

## 2019-10-26 ENCOUNTER — Encounter (HOSPITAL_COMMUNITY): Payer: Self-pay

## 2019-10-26 ENCOUNTER — Other Ambulatory Visit: Payer: Self-pay

## 2019-10-26 ENCOUNTER — Emergency Department (HOSPITAL_COMMUNITY)
Admission: EM | Admit: 2019-10-26 | Discharge: 2019-10-26 | Disposition: A | Discharge status: Home / Self Care | Payer: Medicare Other | Attending: Emergency Medicine | Admitting: Emergency Medicine

## 2019-10-26 DIAGNOSIS — I2510 Atherosclerotic heart disease of native coronary artery without angina pectoris: Secondary | ICD-10-CM | POA: Diagnosis not present

## 2019-10-26 DIAGNOSIS — Z96651 Presence of right artificial knee joint: Secondary | ICD-10-CM | POA: Diagnosis not present

## 2019-10-26 DIAGNOSIS — Z7901 Long term (current) use of anticoagulants: Secondary | ICD-10-CM | POA: Insufficient documentation

## 2019-10-26 DIAGNOSIS — I130 Hypertensive heart and chronic kidney disease with heart failure and stage 1 through stage 4 chronic kidney disease, or unspecified chronic kidney disease: Secondary | ICD-10-CM | POA: Insufficient documentation

## 2019-10-26 DIAGNOSIS — Y929 Unspecified place or not applicable: Secondary | ICD-10-CM | POA: Diagnosis not present

## 2019-10-26 DIAGNOSIS — N189 Chronic kidney disease, unspecified: Secondary | ICD-10-CM | POA: Diagnosis not present

## 2019-10-26 DIAGNOSIS — R4182 Altered mental status, unspecified: Secondary | ICD-10-CM | POA: Insufficient documentation

## 2019-10-26 DIAGNOSIS — Z794 Long term (current) use of insulin: Secondary | ICD-10-CM | POA: Insufficient documentation

## 2019-10-26 DIAGNOSIS — W19XXXA Unspecified fall, initial encounter: Secondary | ICD-10-CM | POA: Insufficient documentation

## 2019-10-26 DIAGNOSIS — E1122 Type 2 diabetes mellitus with diabetic chronic kidney disease: Secondary | ICD-10-CM | POA: Diagnosis not present

## 2019-10-26 DIAGNOSIS — Z79899 Other long term (current) drug therapy: Secondary | ICD-10-CM | POA: Diagnosis not present

## 2019-10-26 DIAGNOSIS — S299XXA Unspecified injury of thorax, initial encounter: Secondary | ICD-10-CM | POA: Diagnosis not present

## 2019-10-26 DIAGNOSIS — M25551 Pain in right hip: Secondary | ICD-10-CM | POA: Diagnosis not present

## 2019-10-26 DIAGNOSIS — Y939 Activity, unspecified: Secondary | ICD-10-CM | POA: Diagnosis not present

## 2019-10-26 DIAGNOSIS — I5032 Chronic diastolic (congestive) heart failure: Secondary | ICD-10-CM | POA: Diagnosis not present

## 2019-10-26 DIAGNOSIS — R079 Chest pain, unspecified: Secondary | ICD-10-CM | POA: Diagnosis not present

## 2019-10-26 DIAGNOSIS — J45909 Unspecified asthma, uncomplicated: Secondary | ICD-10-CM | POA: Diagnosis not present

## 2019-10-26 DIAGNOSIS — S199XXA Unspecified injury of neck, initial encounter: Secondary | ICD-10-CM | POA: Diagnosis not present

## 2019-10-26 DIAGNOSIS — S0990XA Unspecified injury of head, initial encounter: Secondary | ICD-10-CM | POA: Diagnosis not present

## 2019-10-26 DIAGNOSIS — Z950 Presence of cardiac pacemaker: Secondary | ICD-10-CM | POA: Diagnosis not present

## 2019-10-26 DIAGNOSIS — Y999 Unspecified external cause status: Secondary | ICD-10-CM | POA: Diagnosis not present

## 2019-10-26 DIAGNOSIS — R0789 Other chest pain: Secondary | ICD-10-CM | POA: Insufficient documentation

## 2019-10-26 DIAGNOSIS — S79911A Unspecified injury of right hip, initial encounter: Secondary | ICD-10-CM | POA: Diagnosis not present

## 2019-10-26 LAB — COMPREHENSIVE METABOLIC PANEL
ALT: 21 U/L (ref 0–44)
AST: 19 U/L (ref 15–41)
Albumin: 3.1 g/dL — ABNORMAL LOW (ref 3.5–5.0)
Alkaline Phosphatase: 47 U/L (ref 38–126)
Anion gap: 12 (ref 5–15)
BUN: 41 mg/dL — ABNORMAL HIGH (ref 8–23)
CO2: 28 mmol/L (ref 22–32)
Calcium: 8.9 mg/dL (ref 8.9–10.3)
Chloride: 98 mmol/L (ref 98–111)
Creatinine, Ser: 1.39 mg/dL — ABNORMAL HIGH (ref 0.44–1.00)
GFR calc Af Amer: 40 mL/min — ABNORMAL LOW (ref >60)
GFR calc non Af Amer: 34 mL/min — ABNORMAL LOW (ref >60)
Glucose, Bld: 311 mg/dL — ABNORMAL HIGH (ref 70–99)
Potassium: 4 mmol/L (ref 3.5–5.1)
Sodium: 138 mmol/L (ref 135–145)
Total Bilirubin: 0.9 mg/dL (ref 0.3–1.2)
Total Protein: 6.2 g/dL — ABNORMAL LOW (ref 6.5–8.1)

## 2019-10-26 LAB — CBC WITH DIFFERENTIAL/PLATELET
Abs Immature Granulocytes: 0.03 10*3/uL (ref 0.00–0.07)
Basophils Absolute: 0 10*3/uL (ref 0.0–0.1)
Basophils Relative: 0 %
Eosinophils Absolute: 0 10*3/uL (ref 0.0–0.5)
Eosinophils Relative: 0 %
HCT: 37.1 % (ref 36.0–46.0)
Hemoglobin: 11.2 g/dL — ABNORMAL LOW (ref 12.0–15.0)
Immature Granulocytes: 1 %
Lymphocytes Relative: 6 %
Lymphs Abs: 0.4 10*3/uL — ABNORMAL LOW (ref 0.7–4.0)
MCH: 27.6 pg (ref 26.0–34.0)
MCHC: 30.2 g/dL (ref 30.0–36.0)
MCV: 91.4 fL (ref 80.0–100.0)
Monocytes Absolute: 0.2 10*3/uL (ref 0.1–1.0)
Monocytes Relative: 3 %
Neutro Abs: 5.9 10*3/uL (ref 1.7–7.7)
Neutrophils Relative %: 90 %
Platelets: 132 10*3/uL — ABNORMAL LOW (ref 150–400)
RBC: 4.06 MIL/uL (ref 3.87–5.11)
RDW: 17.2 % — ABNORMAL HIGH (ref 11.5–15.5)
WBC: 6.5 10*3/uL (ref 4.0–10.5)
nRBC: 0 % (ref 0.0–0.2)

## 2019-10-26 LAB — URINALYSIS, ROUTINE W REFLEX MICROSCOPIC
Bilirubin Urine: NEGATIVE
Glucose, UA: NEGATIVE mg/dL
Ketones, ur: NEGATIVE mg/dL
Leukocytes,Ua: NEGATIVE
Nitrite: NEGATIVE
Protein, ur: 100 mg/dL — AB
RBC / HPF: >50 RBC/hpf — ABNORMAL HIGH (ref 0–5)
Specific Gravity, Urine: 1.016 (ref 1.005–1.030)
pH: 5 (ref 5.0–8.0)

## 2019-10-26 LAB — TROPONIN I (HIGH SENSITIVITY): Troponin I (High Sensitivity): 19 ng/L — ABNORMAL HIGH (ref <18)

## 2019-10-26 MED ORDER — CIPROFLOXACIN HCL 500 MG PO TABS: 500.0000 mg | ORAL_TABLET | Freq: Two times a day (BID) | ORAL | 0 refills | Status: AC

## 2019-10-26 NOTE — Discharge Instructions (Addendum)
Follow-up with your doctor next week for recheck.  your doctor can follow-up on the urine culture

## 2019-10-26 NOTE — ED Notes (Signed)
Provider made aware that when I&O cath was done a mod amount of bright red blood noted in depends. Bleeding appears to be vaginal. Pt's daughter denies any recent history of vaginal bleeding.

## 2019-10-26 NOTE — ED Triage Notes (Signed)
Daughter reports that she was outside and heard thump. Pt found laying on back. Complaining of right hip pain

## 2019-10-26 NOTE — ED Provider Notes (Signed)
[facility] EMERGENCY DEPARTMENT Provider Note   CSN: [id number] Arrival date & time: 10/26/19  1003     History   Chief Complaint Chief Complaint  Patient presents with   Fall   Hip Pain    HPI [name] is a 85 y.o. female.     Patient's daughter states that she hurt her mother fall.  Patient complains mildly of right side pain.  The history is provided by the patient. No language interpreter was used.  Fall This is a new problem. The current episode started 3 to 5 hours ago. The problem occurs rarely. The problem has been resolved. Nothing aggravates the symptoms. Nothing relieves the symptoms. She has tried nothing for the symptoms. The treatment provided no relief.    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, speech issues, balance issues, hearing loss, swallowing issues   Dementia ([place])    "a little"   Depression    Diabetes mellitus    type 2   GERD (gastroesophageal reflux disease)    Hypertension    Iron deficiency anemia 05/27/2011   Myocardial infarction [facility])    Renal insufficiency    Scoliosis    Vertigo    "when sugar gets low"   Vision loss    left eye-"half blind"    Patient Active Problem List   Diagnosis Date Noted   Stage 3b chronic kidney disease 10/22/2019   Pleural effusion 10/22/2019   Palliative care by specialist    Hemorrhoids    Sepsis [facility])    Advanced care planning/counseling discussion    Goals of care, counseling/discussion    Altered mental status 10/03/2019   Atrial fibrillation with RVR ([place]) 10/03/2019   CHF  (congestive heart failure) ([place]) 09/24/2019   Atrial fibrillation with rapid ventricular response ([place]) 07/18/2019   Nocturnal enuresis 07/03/2019   Mixed stress and urge urinary incontinence 07/03/2019   Generalized weakness 06/28/2019   Symptomatic bradycardia 06/28/2019   Apnea 02/15/2019   Snoring 02/15/2019   Polyp of colon    Accelerated hypertension 01/20/2019   Rectal bleeding 11/08/2018   Rectal pain 11/08/2018   Abnormal CT scan, colon 11/08/2018   (HFpEF) heart failure with preserved ejection fraction ([place]) 10/09/2018   Acute on chronic diastolic CHF (congestive heart failure) ([place]) 10/09/2018   Aortic stenosis, moderate 10/09/2018   Mitral valve stenosis 10/09/2018   Acute exacerbation of CHF (congestive heart failure) ([place]) 10/07/2018   Acute hypoxemic respiratory failure ([place]) 10/07/2018   Chronic anemia 10/07/2018   Asthma 10/07/2018   CVA (cerebral vascular accident) ([place]) 10/07/2018   CAD (coronary artery disease) 10/07/2018   Constipation 10/07/2018   Physical deconditioning 10/07/2018   History of recurrent UTIs 10/07/2018   CKD stage 3 due to type 2 diabetes mellitus ([place]) 12/22/2017   Dyslipidemia 12/22/2017   Weakness 11/23/2017   Overweight (BMI 25.0-29.9) 05/08/2016   UTI (lower urinary tract infection) 11/12/2015   Type 2 diabetes mellitus with stage 3b chronic kidney disease, with  long-term current use of insulin ([place]) 11/12/2015   Dementia ([place])    Metabolic encephalopathy 14/78/2956   S/P right TKA 08/13/2015   S/P knee replacement 08/13/2015   Elevated troponin 03/06/2015   Fever    History of diabetes mellitus    Left buttock pain    Mixed hyperlipidemia 01/11/2015   GAD (generalized anxiety disorder) 01/11/2015   Depression 01/11/2015   GERD (gastroesophageal reflux disease) 01/11/2015   Osteopenia 07/13/2014   Obesity (BMI 30-39.9) 06/19/2014   Carotid stenosis    Anemia of chronic disease  12/25/2011   Iron deficiency anemia 21/30/8657   Diastolic CHF, chronic ([place]) 04/02/2010   Type 2 diabetes mellitus with insulin therapy ([place]) 10/08/2009   HLD (hyperlipidemia) 10/08/2009   HTN (hypertension) 10/08/2009   Coronary atherosclerosis 10/08/2009   CAROTID STENOSIS 10/08/2009    Past Surgical History:  Procedure Laterality Date   APPENDECTOMY     with hysterectomy   CATARACT EXTRACTION Bilateral 5 years ago   CHOLECYSTECTOMY     COLONOSCOPY  2005   Dr. Amedeo Plenty: Diverticulosis   COLONOSCOPY WITH PROPOFOL N/A 02/14/2019   Procedure: COLONOSCOPY WITH PROPOFOL;  Surgeon: Danie Binder, MD;  Location: [facility];  Service: Endoscopy;  Laterality: N/A;   CORONARY ANGIOPLASTY     prior to 2006 5 stents   CORONARY ARTERY BYPASS GRAFT  2006   [facility] [place]  April 2013   KNEE SURGERY Right    PACEMAKER IMPLANT N/A 07/24/2019   Procedure: PACEMAKER IMPLANT;  Surgeon: Evans Lance, MD;  Location: [place] [facility];  Service: Cardiovascular;  Laterality: N/A;   PACEMAKER INSERTION     POLYPECTOMY  02/14/2019   Procedure: POLYPECTOMY;  Surgeon: Danie Binder, MD;  Location: [facility];  Service: Endoscopy;;  cecal polyp   TOTAL ABDOMINAL HYSTERECTOMY  ~85 years old   complete, with tumor removal   TOTAL KNEE ARTHROPLASTY Right 08/13/2015   Procedure: RIGHT  TOTAL KNEE ARTHROPLASTY;  Surgeon: Paralee Cancel, MD;  Location: [facility];  Service: Orthopedics;  Laterality: Right;     OB History    Gravida  4   Para  4   Term  4   Preterm      AB      Living  3     SAB      TAB      Ectopic      Multiple      Live Births               Home Medications    Prior to Admission medications   Medication Sig Start Date End Date Taking? Authorizing Provider  albuterol (PROVENTIL HFA;VENTOLIN HFA) 108 (90 Base) MCG/ACT inhaler Inhale 2 puffs into the lungs every 6 (six) hours as needed for wheezing or shortness of breath. 01/20/19  Yes  Stacks, Cletus Gash, MD  apixaban (ELIQUIS) 2.5 MG TABS tablet Take 1 tablet (2.5 mg total) by mouth 2 (two) times daily. 08/23/19  Yes Minus Breeding, MD  atorvastatin (LIPITOR) 20 MG tablet Take 1 tablet (20 mg total) by mouth at bedtime. 10/18/18  Yes Stacks, Cletus Gash, MD  Carboxymethylcellul-Glycerin (CLEAR EYES FOR DRY EYES) 1-0.25 % SOLN Place 1 drop into both eyes daily as needed (irritation).    Yes [provider]  clotrimazole-betamethasone (LOTRISONE) cream Apply 1 application topically 2 (two) times daily. Patient taking differently: Apply 1 application topically 2 (two) times daily as needed.  10/11/19  Yes [name], MD  diltiazem (CARDIZEM CD) 180 MG 24 hr capsule Take 1 capsule (180 mg total) by mouth daily. 10/16/19  Yes [name], MD  docusate sodium (COLACE) 100 MG capsule Take 1 capsule (100 mg total) by mouth 2 (two) times daily. 09/26/19  Yes Barton Dubois, MD  esomeprazole (NEXIUM) 40 MG capsule TAKE 1 CAPSULE BY MOUTH ONCE DAILY. Patient taking differently: Take 40 mg by mouth daily.  10/02/19  Yes [name], MD  ferrous sulfate 324 (65 Fe) MG TBEC Take 324 mg by mouth every morning.    Yes [provider]  furosemide (LASIX) 20 MG tablet Take 40 mg by mouth daily. Patient may take an additional 20-40mg  a day if needed for swelling   Yes [provider]  Insulin Detemir (LEVEMIR FLEXTOUCH) 100 UNIT/ML Pen INJECT 16 UNITS daily Patient taking differently: Inject 16 Units into the skin 2 (two) times daily. INJECT 16 UNITS daily 10/07/19  Yes Johnson, Clanford L, MD  insulin lispro (HUMALOG) 100 UNIT/ML injection Check glucose before each meal (TID). If over 200 take 5 units. If over 300 take 8 units. If over 400 take 10 units. 09/27/19  Yes [name], MD  magnesium hydroxide (MILK OF MAGNESIA) 400 MG/5ML suspension Take 30 mLs by mouth daily as needed for moderate constipation. 09/26/19  Yes Barton Dubois, MD  methenamine (MANDELAMINE) 1 g  tablet Take 1 tablet (1,000 mg total) by mouth 2 (two) times daily. 10/16/19  Yes [name], MD  metoprolol succinate (TOPROL-XL) 100 MG 24 hr tablet Take 1 tablet (100 mg total) by mouth daily. For blood pressure control 09/15/19  Yes Stacks, Cletus Gash, MD  Multiple Vitamins-Iron (DAILY VITAMINS/IRON/BETA CAROT PO) Take 1 tablet by mouth at bedtime.   Yes [provider]  naproxen sodium (ALEVE) 220 MG tablet Take 440 mg by mouth daily as needed.   Yes [provider]  nitroGLYCERIN (NITROSTAT) 0.4 MG SL tablet PLACE 1 TAB UNDER TONGUE EVERY 3 MINUTES AS DIRECTED UP TO 3 TIMES ASNEEDED FOR CHEST PAIN. Patient taking differently: Place 0.4 mg under the tongue every 5 (five) minutes as needed.  10/02/19  Yes Stacks, Cletus Gash, MD  PARoxetine (PAXIL) 20 MG tablet TAKE 1 TABLET BY MOUTH EVERY MORNING. Patient taking differently: Take 20 mg by mouth daily.  10/02/19  Yes Stacks, Cletus Gash, MD  polyethylene glycol (MIRALAX) 17 g packet Take 17 g by mouth 2 (two) times daily. Patient taking differently: Take 17 g by mouth 2 (two) times daily as needed.  09/26/19  Yes Barton Dubois, MD  ramipril (ALTACE) 5 MG capsule Take 1 capsule (5 mg total) by mouth daily. 09/06/19  Yes [name], MD  ciprofloxacin (CIPRO) 500 MG tablet Take 1 tablet (500 mg total) by mouth 2 (two) times daily. One po bid x 7 days 10/26/19   [name], MD  levofloxacin (LEVAQUIN) 250 MG tablet Take 1 tablet (250 mg total) by mouth daily. Patient not taking: Reported on 10/26/2019 10/16/19   [name], MD    Family History Family History  Problem Relation Age of Onset   Cancer Brother        porstate   Early death Sister    Heart disease Brother    Heart disease Brother    Heart attack Brother    Heart disease Brother    Heart attack Brother    Diabetes Sister    Heart attack Sister    Diabetes Sister    Osteoporosis Sister    Hypertension Sister  Heart attack Son    Early death  Son    Pancreatitis Son    Heart attack Daughter 41    Social History Social History   Tobacco Use   Smoking status: Never Smoker   Smokeless tobacco: Never Used  Substance Use Topics   Alcohol use: No   Drug use: No     Allergies   Advil [ibuprofen], Heparin, Propoxyphene n-acetaminophen, and Acetaminophen   Review of Systems Review of Systems  Unable to perform ROS: Mental status change     Physical Exam Updated Vital Signs BP (!) 162/72    Pulse 73    Temp (!) 97.5 F (36.4 C) (Oral)    Resp (!) 22    Ht 5\' 5"  (1.651 m)    Wt 66.2 kg    SpO2 (!) 87%    BMI 24.30 kg/m   Physical Exam Vitals signs and nursing note reviewed.  Constitutional:      Appearance: She is well-developed.  HENT:     Head: Normocephalic.     Nose: Nose normal.  Eyes:     General: No scleral icterus.    Conjunctiva/sclera: Conjunctivae normal.  Neck:     Musculoskeletal: Neck supple.     Thyroid: No thyromegaly.  Cardiovascular:     Rate and Rhythm: Normal rate and regular rhythm.     Heart sounds: No murmur. No friction rub. No gallop.   Pulmonary:     Breath sounds: No stridor. No wheezing or rales.     Comments: Minimal right-sided chest pain Chest:     Chest wall: No tenderness.  Abdominal:     General: There is no distension.     Tenderness: There is no abdominal tenderness. There is no rebound.  Musculoskeletal: Normal range of motion.  Lymphadenopathy:     Cervical: No cervical adenopathy.  Skin:    Findings: No erythema or rash.  Neurological:     Mental Status: She is alert and oriented to person, place, and time.     Motor: No abnormal muscle tone.     Coordination: Coordination normal.  Psychiatric:        Behavior: Behavior normal.      ED Treatments / Results  Labs (all labs ordered are listed, but only abnormal results are displayed) Labs Reviewed  CBC WITH DIFFERENTIAL/PLATELET - Abnormal; Notable for the following components:      Result Value     Hemoglobin 11.2 (*)    RDW 17.2 (*)    Platelets 132 (*)    Lymphs Abs 0.4 (*)    All other components within normal limits  COMPREHENSIVE METABOLIC PANEL - Abnormal; Notable for the following components:   Glucose, Bld 311 (*)    BUN 41 (*)    Creatinine, Ser 1.39 (*)    Total Protein 6.2 (*)    Albumin 3.1 (*)    GFR calc non Af Amer 34 (*)    GFR calc Af Amer 40 (*)    All other components within normal limits  URINALYSIS, ROUTINE W REFLEX MICROSCOPIC - Abnormal; Notable for the following components:   Color, Urine AMBER (*)    APPearance HAZY (*)    Hgb urine dipstick MODERATE (*)    Protein, ur 100 (*)    RBC / HPF >50 (*)    Bacteria, UA RARE (*)    All other components within normal limits  TROPONIN I (HIGH SENSITIVITY) - Abnormal; Notable for the following components:   Troponin  I (High Sensitivity) 19 (*)    All other components within normal limits  URINE CULTURE  TROPONIN I (HIGH SENSITIVITY)    EKG EKG Interpretation  Date/Time:  Thursday October 26 2019 11:33:36 EST Ventricular Rate:  81 PR Interval:    QRS Duration: 156 QT Interval:  428 QTC Calculation: 497 R Axis:   141 Text Interpretation: Atrial fibrillation RBBB and LPFB Confirmed by [name] [phone]) on 10/26/2019 2:34:51 PM   Radiology Dg Chest 2 View  Result Date: 10/26/2019 CLINICAL DATA:  Fall EXAM: CHEST - 2 VIEW COMPARISON:  October 06, 2019 FINDINGS: The heart size and mediastinal contours unchanged with mild cardiomegaly and aortic knob calcifications. There is small bilateral pleural effusions, right greater than left with hazy adjacent airspace opacity at both lung bases. This slight interval worsening in the interstitial opacities in the left lower lung. No acute osseous abnormality. Overlying median stroma sternotomy wires and left-sided pacemaker again noted. IMPRESSION: 1. Small bilateral pleural effusions with adjacent hazy airspace opacity which could be atelectasis and/or  layering effusion which was seen on prior CT. 2. Interval slight worsening in the interstitial opacity at the left lung base which could be due to atelectasis and/or early infectious etiology. 3. Mild cardiomegaly. Electronically Signed   By: Prudencio Pair M.D.   On: 10/26/2019 13:26   Ct Head Wo Contrast  Result Date: 10/26/2019 CLINICAL DATA:  Fall EXAM: CT HEAD WITHOUT CONTRAST TECHNIQUE: Contiguous axial images were obtained from the base of the skull through the vertex without intravenous contrast. COMPARISON:  October 03, 2019 FINDINGS: Brain: There is no acute intracranial hemorrhage, mass-effect, or edema. Gray-white differentiation is preserved. There is no extra-axial fluid collection. Prominence of the ventricles and sulci reflects stable parenchymal volume loss. Chronic right occipital and cerebellar infarctions again seen. Confluent areas of hypoattenuation in the supratentorial white matter likely reflects stable chronic microvascular ischemic changes. Vascular: There is atherosclerotic calcification at the skull base. Skull: Calvarium is unremarkable. Sinuses/Orbits: Stable partially imaged inflammatory changes of the maxillary sinuses. No acute orbital abnormality. Other: Mastoid air cells are clear. IMPRESSION: No evidence of acute intracranial injury. Stable chronic findings detailed above. Electronically Signed   By: Macy Mis M.D.   On: 10/26/2019 13:53   Ct Cervical Spine Wo Contrast  Result Date: 10/26/2019 CLINICAL DATA:  Fall EXAM: CT CERVICAL SPINE WITHOUT CONTRAST TECHNIQUE: Multidetector CT imaging of the cervical spine was performed without intravenous contrast. Multiplanar CT image reconstructions were also generated. COMPARISON:  None. FINDINGS: Alignment: Straightening of the cervical lordosis. Anteroposterior alignment is maintained Skull base and vertebrae: No acute fracture. Vertebral body heights are maintained. Soft tissues and spinal canal: No prevertebral fluid or  swelling. No visible canal hematoma. Disc levels: Multilevel degenerative changes are present including disc height loss, disc protrusions, and facet and uncovertebral hypertrophy. There is no high-grade osseous encroachment on the spinal canal. Upper chest: Partially imaged moderate right pleural effusion. Other: Calcified plaque at the common carotid bifurcations. IMPRESSION: No acute cervical spine fracture. Partially imaged moderate right pleural effusion also present on October 06, 2019 chest CT. Electronically Signed   By: Macy Mis M.D.   On: 10/26/2019 13:48   Dg Hip Unilat With Pelvis 2-3 Views Right  Result Date: 10/26/2019 CLINICAL DATA:  Unwitnessed fall EXAM: DG HIP (WITH OR WITHOUT PELVIS) 2-3V RIGHT COMPARISON:  June 28, 2019 FINDINGS: There is no evidence of hip fracture or dislocation. There is diffuse osteopenia which somewhat limits evaluation. Dense vascular calcifications are  noted. Degenerative changes in the lower lumbar spine. There is no evidence of arthropathy or other focal bone abnormality. IMPRESSION: No definite acute osseous abnormality, however somewhat limited due to diffuse osteopenia. Electronically Signed   By: Prudencio Pair M.D.   On: 10/26/2019 13:24    Procedures Procedures (including critical care time)  Medications Ordered in ED Medications - No data to display   Initial Impression / Assessment and Plan / ED Course  I have reviewed the triage vital signs and the nursing notes.  Pertinent labs & imaging results that were available during my care of the patient were reviewed by me and considered in my medical decision making (see chart for details).        X-rays show no fractures or head injuries.  Urine suggest possible UTI.  Patient will get a urine culture done and be started on Cipro and follow-up with her PCP  Final Clinical Impressions(s) / ED Diagnoses   Final diagnoses:  Fall, initial encounter    ED Discharge Orders         Ordered     ciprofloxacin (CIPRO) 500 MG tablet  2 times daily     10/26/19 1441           [name], MD 10/26/19 1446

## 2019-10-27 DIAGNOSIS — M160 Bilateral primary osteoarthritis of hip: Secondary | ICD-10-CM | POA: Diagnosis not present

## 2019-10-27 DIAGNOSIS — K219 Gastro-esophageal reflux disease without esophagitis: Secondary | ICD-10-CM | POA: Diagnosis not present

## 2019-10-27 DIAGNOSIS — M519 Unspecified thoracic, thoracolumbar and lumbosacral intervertebral disc disorder: Secondary | ICD-10-CM | POA: Diagnosis not present

## 2019-10-27 DIAGNOSIS — I69398 Other sequelae of cerebral infarction: Secondary | ICD-10-CM | POA: Diagnosis not present

## 2019-10-27 DIAGNOSIS — N390 Urinary tract infection, site not specified: Secondary | ICD-10-CM | POA: Diagnosis not present

## 2019-10-27 DIAGNOSIS — I2510 Atherosclerotic heart disease of native coronary artery without angina pectoris: Secondary | ICD-10-CM | POA: Diagnosis not present

## 2019-10-27 DIAGNOSIS — D509 Iron deficiency anemia, unspecified: Secondary | ICD-10-CM | POA: Diagnosis not present

## 2019-10-27 DIAGNOSIS — H5462 Unqualified visual loss, left eye, normal vision right eye: Secondary | ICD-10-CM | POA: Diagnosis not present

## 2019-10-27 DIAGNOSIS — I69322 Dysarthria following cerebral infarction: Secondary | ICD-10-CM | POA: Diagnosis not present

## 2019-10-27 DIAGNOSIS — N1832 Chronic kidney disease, stage 3b: Secondary | ICD-10-CM | POA: Diagnosis not present

## 2019-10-27 DIAGNOSIS — J45909 Unspecified asthma, uncomplicated: Secondary | ICD-10-CM | POA: Diagnosis not present

## 2019-10-27 DIAGNOSIS — H9190 Unspecified hearing loss, unspecified ear: Secondary | ICD-10-CM | POA: Diagnosis not present

## 2019-10-27 DIAGNOSIS — I4891 Unspecified atrial fibrillation: Secondary | ICD-10-CM | POA: Diagnosis not present

## 2019-10-27 DIAGNOSIS — I080 Rheumatic disorders of both mitral and aortic valves: Secondary | ICD-10-CM | POA: Diagnosis not present

## 2019-10-27 DIAGNOSIS — I5032 Chronic diastolic (congestive) heart failure: Secondary | ICD-10-CM | POA: Diagnosis not present

## 2019-10-27 DIAGNOSIS — M1712 Unilateral primary osteoarthritis, left knee: Secondary | ICD-10-CM | POA: Diagnosis not present

## 2019-10-27 DIAGNOSIS — I252 Old myocardial infarction: Secondary | ICD-10-CM | POA: Diagnosis not present

## 2019-10-27 DIAGNOSIS — I110 Hypertensive heart disease with heart failure: Secondary | ICD-10-CM | POA: Diagnosis not present

## 2019-10-27 DIAGNOSIS — E1122 Type 2 diabetes mellitus with diabetic chronic kidney disease: Secondary | ICD-10-CM | POA: Diagnosis not present

## 2019-10-27 DIAGNOSIS — S51812D Laceration without foreign body of left forearm, subsequent encounter: Secondary | ICD-10-CM | POA: Diagnosis not present

## 2019-10-27 DIAGNOSIS — M419 Scoliosis, unspecified: Secondary | ICD-10-CM | POA: Diagnosis not present

## 2019-10-27 LAB — URINE CULTURE: Culture: NO GROWTH

## 2019-10-29 ENCOUNTER — Other Ambulatory Visit: Payer: Self-pay | Admitting: Family Medicine

## 2019-10-29 DIAGNOSIS — F411 Generalized anxiety disorder: Secondary | ICD-10-CM

## 2019-10-29 DIAGNOSIS — K219 Gastro-esophageal reflux disease without esophagitis: Secondary | ICD-10-CM

## 2019-10-29 DIAGNOSIS — E785 Hyperlipidemia, unspecified: Secondary | ICD-10-CM

## 2019-10-30 DIAGNOSIS — M160 Bilateral primary osteoarthritis of hip: Secondary | ICD-10-CM | POA: Diagnosis not present

## 2019-10-30 DIAGNOSIS — E1122 Type 2 diabetes mellitus with diabetic chronic kidney disease: Secondary | ICD-10-CM | POA: Diagnosis not present

## 2019-10-30 DIAGNOSIS — I69322 Dysarthria following cerebral infarction: Secondary | ICD-10-CM | POA: Diagnosis not present

## 2019-10-30 DIAGNOSIS — I2510 Atherosclerotic heart disease of native coronary artery without angina pectoris: Secondary | ICD-10-CM | POA: Diagnosis not present

## 2019-10-30 DIAGNOSIS — J45909 Unspecified asthma, uncomplicated: Secondary | ICD-10-CM | POA: Diagnosis not present

## 2019-10-30 DIAGNOSIS — I69398 Other sequelae of cerebral infarction: Secondary | ICD-10-CM | POA: Diagnosis not present

## 2019-10-30 DIAGNOSIS — M1712 Unilateral primary osteoarthritis, left knee: Secondary | ICD-10-CM | POA: Diagnosis not present

## 2019-10-30 DIAGNOSIS — I5032 Chronic diastolic (congestive) heart failure: Secondary | ICD-10-CM | POA: Diagnosis not present

## 2019-10-30 DIAGNOSIS — H9190 Unspecified hearing loss, unspecified ear: Secondary | ICD-10-CM | POA: Diagnosis not present

## 2019-10-30 DIAGNOSIS — N1832 Chronic kidney disease, stage 3b: Secondary | ICD-10-CM | POA: Diagnosis not present

## 2019-10-30 DIAGNOSIS — H5462 Unqualified visual loss, left eye, normal vision right eye: Secondary | ICD-10-CM | POA: Diagnosis not present

## 2019-10-30 DIAGNOSIS — I4891 Unspecified atrial fibrillation: Secondary | ICD-10-CM | POA: Diagnosis not present

## 2019-10-30 DIAGNOSIS — D509 Iron deficiency anemia, unspecified: Secondary | ICD-10-CM | POA: Diagnosis not present

## 2019-10-30 DIAGNOSIS — I080 Rheumatic disorders of both mitral and aortic valves: Secondary | ICD-10-CM | POA: Diagnosis not present

## 2019-10-30 DIAGNOSIS — M419 Scoliosis, unspecified: Secondary | ICD-10-CM | POA: Diagnosis not present

## 2019-10-30 DIAGNOSIS — M519 Unspecified thoracic, thoracolumbar and lumbosacral intervertebral disc disorder: Secondary | ICD-10-CM | POA: Diagnosis not present

## 2019-10-30 DIAGNOSIS — S51812D Laceration without foreign body of left forearm, subsequent encounter: Secondary | ICD-10-CM | POA: Diagnosis not present

## 2019-10-30 DIAGNOSIS — I252 Old myocardial infarction: Secondary | ICD-10-CM | POA: Diagnosis not present

## 2019-10-30 DIAGNOSIS — I110 Hypertensive heart disease with heart failure: Secondary | ICD-10-CM | POA: Diagnosis not present

## 2019-10-30 DIAGNOSIS — K219 Gastro-esophageal reflux disease without esophagitis: Secondary | ICD-10-CM | POA: Diagnosis not present

## 2019-10-30 DIAGNOSIS — N390 Urinary tract infection, site not specified: Secondary | ICD-10-CM | POA: Diagnosis not present

## 2019-10-31 DIAGNOSIS — M160 Bilateral primary osteoarthritis of hip: Secondary | ICD-10-CM | POA: Diagnosis not present

## 2019-10-31 DIAGNOSIS — M1712 Unilateral primary osteoarthritis, left knee: Secondary | ICD-10-CM | POA: Diagnosis not present

## 2019-10-31 DIAGNOSIS — D509 Iron deficiency anemia, unspecified: Secondary | ICD-10-CM | POA: Diagnosis not present

## 2019-10-31 DIAGNOSIS — I2510 Atherosclerotic heart disease of native coronary artery without angina pectoris: Secondary | ICD-10-CM | POA: Diagnosis not present

## 2019-10-31 DIAGNOSIS — M419 Scoliosis, unspecified: Secondary | ICD-10-CM | POA: Diagnosis not present

## 2019-10-31 DIAGNOSIS — M519 Unspecified thoracic, thoracolumbar and lumbosacral intervertebral disc disorder: Secondary | ICD-10-CM | POA: Diagnosis not present

## 2019-10-31 DIAGNOSIS — H9190 Unspecified hearing loss, unspecified ear: Secondary | ICD-10-CM | POA: Diagnosis not present

## 2019-10-31 DIAGNOSIS — J45909 Unspecified asthma, uncomplicated: Secondary | ICD-10-CM | POA: Diagnosis not present

## 2019-10-31 DIAGNOSIS — I5032 Chronic diastolic (congestive) heart failure: Secondary | ICD-10-CM | POA: Diagnosis not present

## 2019-10-31 DIAGNOSIS — S51812D Laceration without foreign body of left forearm, subsequent encounter: Secondary | ICD-10-CM | POA: Diagnosis not present

## 2019-10-31 DIAGNOSIS — H5462 Unqualified visual loss, left eye, normal vision right eye: Secondary | ICD-10-CM | POA: Diagnosis not present

## 2019-10-31 DIAGNOSIS — N1832 Chronic kidney disease, stage 3b: Secondary | ICD-10-CM | POA: Diagnosis not present

## 2019-10-31 DIAGNOSIS — I110 Hypertensive heart disease with heart failure: Secondary | ICD-10-CM | POA: Diagnosis not present

## 2019-10-31 DIAGNOSIS — I69322 Dysarthria following cerebral infarction: Secondary | ICD-10-CM | POA: Diagnosis not present

## 2019-10-31 DIAGNOSIS — E1122 Type 2 diabetes mellitus with diabetic chronic kidney disease: Secondary | ICD-10-CM | POA: Diagnosis not present

## 2019-10-31 DIAGNOSIS — I4891 Unspecified atrial fibrillation: Secondary | ICD-10-CM | POA: Diagnosis not present

## 2019-10-31 DIAGNOSIS — I252 Old myocardial infarction: Secondary | ICD-10-CM | POA: Diagnosis not present

## 2019-10-31 DIAGNOSIS — I080 Rheumatic disorders of both mitral and aortic valves: Secondary | ICD-10-CM | POA: Diagnosis not present

## 2019-10-31 DIAGNOSIS — N390 Urinary tract infection, site not specified: Secondary | ICD-10-CM | POA: Diagnosis not present

## 2019-10-31 DIAGNOSIS — I69398 Other sequelae of cerebral infarction: Secondary | ICD-10-CM | POA: Diagnosis not present

## 2019-10-31 DIAGNOSIS — K219 Gastro-esophageal reflux disease without esophagitis: Secondary | ICD-10-CM | POA: Diagnosis not present

## 2019-11-02 ENCOUNTER — Emergency Department (HOSPITAL_COMMUNITY)
Admission: EM | Admit: 2019-11-02 | Discharge: 2019-11-02 | Disposition: A | Discharge status: Home / Self Care | Payer: Medicare Other | Source: Home / Self Care | Attending: Emergency Medicine | Admitting: Emergency Medicine

## 2019-11-02 ENCOUNTER — Other Ambulatory Visit: Payer: Self-pay

## 2019-11-02 ENCOUNTER — Emergency Department (HOSPITAL_COMMUNITY): Payer: Medicare Other

## 2019-11-02 ENCOUNTER — Encounter (HOSPITAL_COMMUNITY): Payer: Self-pay | Admitting: Emergency Medicine

## 2019-11-02 DIAGNOSIS — N390 Urinary tract infection, site not specified: Secondary | ICD-10-CM

## 2019-11-02 DIAGNOSIS — A4151 Sepsis due to Escherichia coli [E. coli]: Secondary | ICD-10-CM | POA: Diagnosis not present

## 2019-11-02 DIAGNOSIS — E860 Dehydration: Secondary | ICD-10-CM | POA: Diagnosis not present

## 2019-11-02 DIAGNOSIS — N179 Acute kidney failure, unspecified: Secondary | ICD-10-CM

## 2019-11-02 DIAGNOSIS — N189 Chronic kidney disease, unspecified: Secondary | ICD-10-CM

## 2019-11-02 DIAGNOSIS — R4020 Unspecified coma: Secondary | ICD-10-CM | POA: Diagnosis not present

## 2019-11-02 LAB — BASIC METABOLIC PANEL
Anion gap: 13 (ref 5–15)
BUN: 68 mg/dL — ABNORMAL HIGH (ref 8–23)
CO2: 25 mmol/L (ref 22–32)
Calcium: 8.7 mg/dL — ABNORMAL LOW (ref 8.9–10.3)
Chloride: 100 mmol/L (ref 98–111)
Creatinine, Ser: 2.4 mg/dL — ABNORMAL HIGH (ref 0.44–1.00)
GFR calc Af Amer: 20 mL/min — ABNORMAL LOW (ref >60)
GFR calc non Af Amer: 18 mL/min — ABNORMAL LOW (ref >60)
Glucose, Bld: 229 mg/dL — ABNORMAL HIGH (ref 70–99)
Potassium: 3.4 mmol/L — ABNORMAL LOW (ref 3.5–5.1)
Sodium: 138 mmol/L (ref 135–145)

## 2019-11-02 LAB — URINALYSIS, ROUTINE W REFLEX MICROSCOPIC
Bilirubin Urine: NEGATIVE
Glucose, UA: NEGATIVE mg/dL
Ketones, ur: NEGATIVE mg/dL
Nitrite: NEGATIVE
Protein, ur: 30 mg/dL — AB
Specific Gravity, Urine: 1.012 (ref 1.005–1.030)
WBC, UA: >50 WBC/hpf — ABNORMAL HIGH (ref 0–5)
pH: 5 (ref 5.0–8.0)

## 2019-11-02 LAB — CBC WITH DIFFERENTIAL/PLATELET
Abs Immature Granulocytes: 0.02 10*3/uL (ref 0.00–0.07)
Basophils Absolute: 0 10*3/uL (ref 0.0–0.1)
Basophils Relative: 0 %
Eosinophils Absolute: 0 10*3/uL (ref 0.0–0.5)
Eosinophils Relative: 1 %
HCT: 38.4 % (ref 36.0–46.0)
Hemoglobin: 11.7 g/dL — ABNORMAL LOW (ref 12.0–15.0)
Immature Granulocytes: 0 %
Lymphocytes Relative: 12 %
Lymphs Abs: 0.6 10*3/uL — ABNORMAL LOW (ref 0.7–4.0)
MCH: 27.5 pg (ref 26.0–34.0)
MCHC: 30.5 g/dL (ref 30.0–36.0)
MCV: 90.4 fL (ref 80.0–100.0)
Monocytes Absolute: 0.4 10*3/uL (ref 0.1–1.0)
Monocytes Relative: 8 %
Neutro Abs: 4 10*3/uL (ref 1.7–7.7)
Neutrophils Relative %: 79 %
Platelets: 133 10*3/uL — ABNORMAL LOW (ref 150–400)
RBC: 4.25 MIL/uL (ref 3.87–5.11)
RDW: 18.8 % — ABNORMAL HIGH (ref 11.5–15.5)
WBC: 5.1 10*3/uL (ref 4.0–10.5)
nRBC: 0 % (ref 0.0–0.2)

## 2019-11-02 LAB — CBG MONITORING, ED: Glucose-Capillary: 220 mg/dL — ABNORMAL HIGH (ref 70–99)

## 2019-11-02 MED ORDER — SODIUM CHLORIDE 0.9 % IV SOLN
1.0000 g | Freq: Once | INTRAVENOUS | Status: AC
  Administered 2019-11-02: 1 g via INTRAVENOUS
  Filled 2019-11-02: qty 10

## 2019-11-02 MED ORDER — CEPHALEXIN 500 MG PO CAPS: 500.0000 mg | ORAL_CAPSULE | Freq: Two times a day (BID) | ORAL | 0 refills | Status: AC

## 2019-11-02 NOTE — Discharge Instructions (Addendum)
You kidney function has worsened since recent labs. Stop taking furosemide for the the next two days. Keep legs elevated. Wear compression hose. I want you to follow-up with your PCP and have repeat blood work next week.   You still have a urinary tract infection. I am placing you on a new antibiotic.

## 2019-11-02 NOTE — ED Notes (Signed)
Pt was released from the hospital last week   Has appt with PCP tomorrow, heart doctor   Daughter brought today because "my sister cam e and her blood sugar was high, she's confused and swelling"  Pt has hx of dementia, CHF

## 2019-11-02 NOTE — ED Triage Notes (Signed)
Daughter checked bs 1230 and was over 500. Daughter gave reg 10units humalog, 5 units more 30 min later, then another 5 units 30 min after that. Daughter states pt is confused and thinks may be UTI. Pt alert, gen weakness noted. ble and bue swelling noted. Daughter states swelling was not this bad this morning.

## 2019-11-03 ENCOUNTER — Inpatient Hospital Stay (HOSPITAL_COMMUNITY): Payer: Medicare Other

## 2019-11-03 ENCOUNTER — Inpatient Hospital Stay (HOSPITAL_COMMUNITY)
Admission: EM | Admit: 2019-11-03 | Discharge: 2019-11-07 | DRG: 871 | Disposition: E | Payer: Medicare Other | Attending: Internal Medicine | Admitting: Internal Medicine

## 2019-11-03 ENCOUNTER — Emergency Department (HOSPITAL_COMMUNITY): Payer: Medicare Other

## 2019-11-03 DIAGNOSIS — Z87440 Personal history of urinary (tract) infections: Secondary | ICD-10-CM | POA: Diagnosis present

## 2019-11-03 DIAGNOSIS — D696 Thrombocytopenia, unspecified: Secondary | ICD-10-CM | POA: Diagnosis present

## 2019-11-03 DIAGNOSIS — G9341 Metabolic encephalopathy: Secondary | ICD-10-CM | POA: Diagnosis not present

## 2019-11-03 DIAGNOSIS — J9601 Acute respiratory failure with hypoxia: Secondary | ICD-10-CM | POA: Diagnosis not present

## 2019-11-03 DIAGNOSIS — E875 Hyperkalemia: Secondary | ICD-10-CM | POA: Diagnosis present

## 2019-11-03 DIAGNOSIS — M419 Scoliosis, unspecified: Secondary | ICD-10-CM | POA: Diagnosis present

## 2019-11-03 DIAGNOSIS — E860 Dehydration: Secondary | ICD-10-CM

## 2019-11-03 DIAGNOSIS — N390 Urinary tract infection, site not specified: Secondary | ICD-10-CM

## 2019-11-03 DIAGNOSIS — Z8249 Family history of ischemic heart disease and other diseases of the circulatory system: Secondary | ICD-10-CM

## 2019-11-03 DIAGNOSIS — M159 Polyosteoarthritis, unspecified: Secondary | ICD-10-CM | POA: Diagnosis not present

## 2019-11-03 DIAGNOSIS — R410 Disorientation, unspecified: Secondary | ICD-10-CM

## 2019-11-03 DIAGNOSIS — I69393 Ataxia following cerebral infarction: Secondary | ICD-10-CM

## 2019-11-03 DIAGNOSIS — I130 Hypertensive heart and chronic kidney disease with heart failure and stage 1 through stage 4 chronic kidney disease, or unspecified chronic kidney disease: Secondary | ICD-10-CM | POA: Diagnosis present

## 2019-11-03 DIAGNOSIS — R0902 Hypoxemia: Secondary | ICD-10-CM | POA: Diagnosis not present

## 2019-11-03 DIAGNOSIS — N1832 Chronic kidney disease, stage 3b: Secondary | ICD-10-CM | POA: Diagnosis present

## 2019-11-03 DIAGNOSIS — H547 Unspecified visual loss: Secondary | ICD-10-CM | POA: Diagnosis not present

## 2019-11-03 DIAGNOSIS — I5032 Chronic diastolic (congestive) heart failure: Secondary | ICD-10-CM | POA: Diagnosis present

## 2019-11-03 DIAGNOSIS — I350 Nonrheumatic aortic (valve) stenosis: Secondary | ICD-10-CM | POA: Diagnosis not present

## 2019-11-03 DIAGNOSIS — R531 Weakness: Secondary | ICD-10-CM

## 2019-11-03 DIAGNOSIS — I69398 Other sequelae of cerebral infarction: Secondary | ICD-10-CM

## 2019-11-03 DIAGNOSIS — Z66 Do not resuscitate: Secondary | ICD-10-CM | POA: Diagnosis not present

## 2019-11-03 DIAGNOSIS — J181 Lobar pneumonia, unspecified organism: Secondary | ICD-10-CM | POA: Diagnosis not present

## 2019-11-03 DIAGNOSIS — I69322 Dysarthria following cerebral infarction: Secondary | ICD-10-CM

## 2019-11-03 DIAGNOSIS — Z452 Encounter for adjustment and management of vascular access device: Secondary | ICD-10-CM | POA: Diagnosis not present

## 2019-11-03 DIAGNOSIS — I2510 Atherosclerotic heart disease of native coronary artery without angina pectoris: Secondary | ICD-10-CM | POA: Diagnosis not present

## 2019-11-03 DIAGNOSIS — E782 Mixed hyperlipidemia: Secondary | ICD-10-CM | POA: Diagnosis present

## 2019-11-03 DIAGNOSIS — R000 Tachycardia, unspecified: Secondary | ICD-10-CM | POA: Diagnosis present

## 2019-11-03 DIAGNOSIS — E1122 Type 2 diabetes mellitus with diabetic chronic kidney disease: Secondary | ICD-10-CM | POA: Diagnosis present

## 2019-11-03 DIAGNOSIS — Z515 Encounter for palliative care: Secondary | ICD-10-CM

## 2019-11-03 DIAGNOSIS — F0390 Unspecified dementia without behavioral disturbance: Secondary | ICD-10-CM | POA: Diagnosis present

## 2019-11-03 DIAGNOSIS — I4891 Unspecified atrial fibrillation: Secondary | ICD-10-CM | POA: Diagnosis not present

## 2019-11-03 DIAGNOSIS — E872 Acidosis, unspecified: Secondary | ICD-10-CM | POA: Diagnosis present

## 2019-11-03 DIAGNOSIS — Z951 Presence of aortocoronary bypass graft: Secondary | ICD-10-CM

## 2019-11-03 DIAGNOSIS — Z7982 Long term (current) use of aspirin: Secondary | ICD-10-CM

## 2019-11-03 DIAGNOSIS — I69391 Dysphagia following cerebral infarction: Secondary | ICD-10-CM | POA: Diagnosis not present

## 2019-11-03 DIAGNOSIS — R5381 Other malaise: Secondary | ICD-10-CM | POA: Diagnosis not present

## 2019-11-03 DIAGNOSIS — K7200 Acute and subacute hepatic failure without coma: Secondary | ICD-10-CM | POA: Diagnosis present

## 2019-11-03 DIAGNOSIS — N179 Acute kidney failure, unspecified: Secondary | ICD-10-CM

## 2019-11-03 DIAGNOSIS — Z888 Allergy status to other drugs, medicaments and biological substances status: Secondary | ICD-10-CM

## 2019-11-03 DIAGNOSIS — R6521 Severe sepsis with septic shock: Secondary | ICD-10-CM | POA: Diagnosis present

## 2019-11-03 DIAGNOSIS — I252 Old myocardial infarction: Secondary | ICD-10-CM

## 2019-11-03 DIAGNOSIS — K5790 Diverticulosis of intestine, part unspecified, without perforation or abscess without bleeding: Secondary | ICD-10-CM | POA: Diagnosis present

## 2019-11-03 DIAGNOSIS — Z7189 Other specified counseling: Secondary | ICD-10-CM

## 2019-11-03 DIAGNOSIS — T68XXXA Hypothermia, initial encounter: Secondary | ICD-10-CM | POA: Diagnosis present

## 2019-11-03 DIAGNOSIS — Y95 Nosocomial condition: Secondary | ICD-10-CM | POA: Diagnosis present

## 2019-11-03 DIAGNOSIS — Z20828 Contact with and (suspected) exposure to other viral communicable diseases: Secondary | ICD-10-CM | POA: Diagnosis present

## 2019-11-03 DIAGNOSIS — J189 Pneumonia, unspecified organism: Secondary | ICD-10-CM | POA: Diagnosis not present

## 2019-11-03 DIAGNOSIS — J90 Pleural effusion, not elsewhere classified: Secondary | ICD-10-CM | POA: Diagnosis not present

## 2019-11-03 DIAGNOSIS — A4151 Sepsis due to Escherichia coli [E. coli]: Secondary | ICD-10-CM | POA: Diagnosis not present

## 2019-11-03 DIAGNOSIS — J45909 Unspecified asthma, uncomplicated: Secondary | ICD-10-CM | POA: Diagnosis present

## 2019-11-03 DIAGNOSIS — I469 Cardiac arrest, cause unspecified: Secondary | ICD-10-CM

## 2019-11-03 DIAGNOSIS — Z833 Family history of diabetes mellitus: Secondary | ICD-10-CM

## 2019-11-03 DIAGNOSIS — Z886 Allergy status to analgesic agent status: Secondary | ICD-10-CM

## 2019-11-03 DIAGNOSIS — K219 Gastro-esophageal reflux disease without esophagitis: Secondary | ICD-10-CM | POA: Diagnosis present

## 2019-11-03 DIAGNOSIS — Z950 Presence of cardiac pacemaker: Secondary | ICD-10-CM

## 2019-11-03 DIAGNOSIS — R1310 Dysphagia, unspecified: Secondary | ICD-10-CM | POA: Diagnosis present

## 2019-11-03 DIAGNOSIS — Z9911 Dependence on respirator [ventilator] status: Secondary | ICD-10-CM

## 2019-11-03 DIAGNOSIS — J9811 Atelectasis: Secondary | ICD-10-CM | POA: Diagnosis not present

## 2019-11-03 DIAGNOSIS — D509 Iron deficiency anemia, unspecified: Secondary | ICD-10-CM | POA: Diagnosis present

## 2019-11-03 DIAGNOSIS — Z794 Long term (current) use of insulin: Secondary | ICD-10-CM

## 2019-11-03 DIAGNOSIS — Z96651 Presence of right artificial knee joint: Secondary | ICD-10-CM | POA: Diagnosis present

## 2019-11-03 DIAGNOSIS — Z7901 Long term (current) use of anticoagulants: Secondary | ICD-10-CM

## 2019-11-03 DIAGNOSIS — I5030 Unspecified diastolic (congestive) heart failure: Secondary | ICD-10-CM | POA: Diagnosis present

## 2019-11-03 DIAGNOSIS — A419 Sepsis, unspecified organism: Secondary | ICD-10-CM | POA: Diagnosis present

## 2019-11-03 DIAGNOSIS — R680 Hypothermia, not associated with low environmental temperature: Secondary | ICD-10-CM | POA: Diagnosis present

## 2019-11-03 DIAGNOSIS — D649 Anemia, unspecified: Secondary | ICD-10-CM | POA: Diagnosis present

## 2019-11-03 LAB — CBC WITH DIFFERENTIAL/PLATELET
Abs Immature Granulocytes: 0.04 10*3/uL (ref 0.00–0.07)
Basophils Absolute: 0 10*3/uL (ref 0.0–0.1)
Basophils Relative: 0 %
Eosinophils Absolute: 0 10*3/uL (ref 0.0–0.5)
Eosinophils Relative: 1 %
HCT: 42.7 % (ref 36.0–46.0)
Hemoglobin: 12.9 g/dL (ref 12.0–15.0)
Immature Granulocytes: 1 %
Lymphocytes Relative: 10 %
Lymphs Abs: 0.5 10*3/uL — ABNORMAL LOW (ref 0.7–4.0)
MCH: 27.6 pg (ref 26.0–34.0)
MCHC: 30.2 g/dL (ref 30.0–36.0)
MCV: 91.2 fL (ref 80.0–100.0)
Monocytes Absolute: 0.3 10*3/uL (ref 0.1–1.0)
Monocytes Relative: 6 %
Neutro Abs: 4 10*3/uL (ref 1.7–7.7)
Neutrophils Relative %: 82 %
Platelets: 127 10*3/uL — ABNORMAL LOW (ref 150–400)
RBC: 4.68 MIL/uL (ref 3.87–5.11)
RDW: 19.3 % — ABNORMAL HIGH (ref 11.5–15.5)
WBC: 4.8 10*3/uL (ref 4.0–10.5)
nRBC: 0 % (ref 0.0–0.2)

## 2019-11-03 LAB — URINALYSIS, ROUTINE W REFLEX MICROSCOPIC
Bilirubin Urine: NEGATIVE
Glucose, UA: NEGATIVE mg/dL
Ketones, ur: NEGATIVE mg/dL
Nitrite: NEGATIVE
Protein, ur: 100 mg/dL — AB
RBC / HPF: >50 RBC/hpf — ABNORMAL HIGH (ref 0–5)
Specific Gravity, Urine: 1.017 (ref 1.005–1.030)
WBC, UA: >50 WBC/hpf — ABNORMAL HIGH (ref 0–5)
pH: 5 (ref 5.0–8.0)

## 2019-11-03 LAB — COMPREHENSIVE METABOLIC PANEL
ALT: 23 U/L (ref 0–44)
AST: 23 U/L (ref 15–41)
Albumin: 3.3 g/dL — ABNORMAL LOW (ref 3.5–5.0)
Alkaline Phosphatase: 51 U/L (ref 38–126)
Anion gap: 14 (ref 5–15)
BUN: 64 mg/dL — ABNORMAL HIGH (ref 8–23)
CO2: 26 mmol/L (ref 22–32)
Calcium: 9 mg/dL (ref 8.9–10.3)
Chloride: 98 mmol/L (ref 98–111)
Creatinine, Ser: 2.16 mg/dL — ABNORMAL HIGH (ref 0.44–1.00)
GFR calc Af Amer: 23 mL/min — ABNORMAL LOW (ref >60)
GFR calc non Af Amer: 20 mL/min — ABNORMAL LOW (ref >60)
Glucose, Bld: 203 mg/dL — ABNORMAL HIGH (ref 70–99)
Potassium: 3.9 mmol/L (ref 3.5–5.1)
Sodium: 138 mmol/L (ref 135–145)
Total Bilirubin: 0.8 mg/dL (ref 0.3–1.2)
Total Protein: 6.3 g/dL — ABNORMAL LOW (ref 6.5–8.1)

## 2019-11-03 LAB — GLUCOSE, CAPILLARY: Glucose-Capillary: 154 mg/dL — ABNORMAL HIGH (ref 70–99)

## 2019-11-03 LAB — SARS CORONAVIRUS 2 BY RT PCR (HOSPITAL ORDER, PERFORMED IN ~~LOC~~ HOSPITAL LAB): SARS Coronavirus 2: NEGATIVE

## 2019-11-03 LAB — MAGNESIUM: Magnesium: 1.9 mg/dL (ref 1.7–2.4)

## 2019-11-03 LAB — LACTIC ACID, PLASMA: Lactic Acid, Venous: 4.5 mmol/L (ref 0.5–1.9)

## 2019-11-03 MED ORDER — ATORVASTATIN CALCIUM 20 MG PO TABS
20.0000 mg | ORAL_TABLET | Freq: Every day | ORAL | Status: DC
  Administered 2019-11-03: 20 mg via ORAL
  Filled 2019-11-03: qty 1

## 2019-11-03 MED ORDER — VANCOMYCIN HCL IN DEXTROSE 1-5 GM/200ML-% IV SOLN: 1000.0000 mg | INTRAVENOUS | Status: DC

## 2019-11-03 MED ORDER — VANCOMYCIN HCL IN DEXTROSE 1-5 GM/200ML-% IV SOLN
1000.0000 mg | Freq: Once | INTRAVENOUS | Status: AC
  Administered 2019-11-03: 1000 mg via INTRAVENOUS
  Filled 2019-11-03: qty 200

## 2019-11-03 MED ORDER — METOPROLOL SUCCINATE ER 50 MG PO TB24: 100.0000 mg | ORAL_TABLET | Freq: Every day | ORAL | Status: DC

## 2019-11-03 MED ORDER — LORAZEPAM 2 MG/ML IJ SOLN: 0.5000 mg | Freq: Four times a day (QID) | INTRAMUSCULAR | Status: DC | PRN

## 2019-11-03 MED ORDER — ACETAMINOPHEN 650 MG RE SUPP: 650.0000 mg | Freq: Four times a day (QID) | RECTAL | Status: DC | PRN

## 2019-11-03 MED ORDER — SODIUM CHLORIDE 0.9 % IV SOLN: INTRAVENOUS | Status: DC

## 2019-11-03 MED ORDER — POLYETHYLENE GLYCOL 3350 17 G PO PACK: 17.0000 g | PACK | Freq: Every day | ORAL | Status: DC | PRN

## 2019-11-03 MED ORDER — DILTIAZEM HCL ER COATED BEADS 180 MG PO CP24: 180.0000 mg | ORAL_CAPSULE | Freq: Every day | ORAL | Status: DC

## 2019-11-03 MED ORDER — INSULIN ASPART 100 UNIT/ML ~~LOC~~ SOLN: 0.0000 [IU] | Freq: Three times a day (TID) | SUBCUTANEOUS | Status: DC

## 2019-11-03 MED ORDER — SODIUM CHLORIDE 0.9 % IV SOLN: 2.0000 g | INTRAVENOUS | Status: DC

## 2019-11-03 MED ORDER — SODIUM CHLORIDE 0.9 % IV BOLUS
1000.0000 mL | Freq: Once | INTRAVENOUS | Status: AC
  Administered 2019-11-03: 1000 mL via INTRAVENOUS

## 2019-11-03 MED ORDER — SODIUM CHLORIDE 0.9 % IV SOLN
1.0000 g | Freq: Once | INTRAVENOUS | Status: AC
  Administered 2019-11-03: 17:00:00 1 g via INTRAVENOUS
  Filled 2019-11-03: qty 10

## 2019-11-03 MED ORDER — APIXABAN 2.5 MG PO TABS
2.5000 mg | ORAL_TABLET | Freq: Two times a day (BID) | ORAL | Status: DC
  Administered 2019-11-03: 2.5 mg via ORAL
  Filled 2019-11-03: qty 1

## 2019-11-03 MED ORDER — SODIUM CHLORIDE 0.9 % IV SOLN: 250.0000 mL | INTRAVENOUS | Status: DC

## 2019-11-03 MED ORDER — ACETAMINOPHEN 325 MG PO TABS: 650.0000 mg | ORAL_TABLET | Freq: Four times a day (QID) | ORAL | Status: DC | PRN

## 2019-11-03 MED ORDER — PANTOPRAZOLE SODIUM 40 MG PO TBEC
40.0000 mg | DELAYED_RELEASE_TABLET | Freq: Every day | ORAL | Status: DC
  Administered 2019-11-03: 40 mg via ORAL
  Filled 2019-11-03: qty 1

## 2019-11-03 MED ORDER — ONDANSETRON HCL 4 MG/2ML IJ SOLN: 4.0000 mg | Freq: Four times a day (QID) | INTRAMUSCULAR | Status: DC | PRN

## 2019-11-03 MED ORDER — SODIUM CHLORIDE 0.9 % IV BOLUS
500.0000 mL | Freq: Once | INTRAVENOUS | Status: AC
  Administered 2019-11-03: 500 mL via INTRAVENOUS

## 2019-11-03 MED ORDER — ASPIRIN EC 325 MG PO TBEC: 325.0000 mg | DELAYED_RELEASE_TABLET | Freq: Every day | ORAL | Status: DC

## 2019-11-03 MED ORDER — SODIUM CHLORIDE 0.9 % IV SOLN
500.0000 mg | Freq: Once | INTRAVENOUS | Status: AC
  Administered 2019-11-03: 500 mg via INTRAVENOUS
  Filled 2019-11-03: qty 500

## 2019-11-03 MED ORDER — NOREPINEPHRINE 4 MG/250ML-% IV SOLN
0.0000 ug/min | INTRAVENOUS | Status: DC
  Administered 2019-11-04 (×2): 40 ug/min via INTRAVENOUS
  Administered 2019-11-04: 30 ug/min via INTRAVENOUS
  Filled 2019-11-03: qty 500
  Filled 2019-11-03: qty 250

## 2019-11-03 MED ORDER — PAROXETINE HCL 20 MG PO TABS: 20.0000 mg | ORAL_TABLET | Freq: Every morning | ORAL | Status: DC

## 2019-11-03 MED ORDER — SODIUM CHLORIDE 0.9 % IV SOLN
2.0000 g | Freq: Once | INTRAVENOUS | Status: AC
  Administered 2019-11-03: 2 g via INTRAVENOUS
  Filled 2019-11-03: qty 2

## 2019-11-03 MED ORDER — INSULIN DETEMIR 100 UNIT/ML ~~LOC~~ SOLN
16.0000 [IU] | Freq: Every day | SUBCUTANEOUS | Status: DC
  Filled 2019-11-03 (×3): qty 0.16

## 2019-11-03 MED ORDER — PHENYLEPHRINE HCL-NACL 10-0.9 MG/250ML-% IV SOLN
25.0000 ug/min | INTRAVENOUS | Status: DC
  Administered 2019-11-03: 25 ug/min via INTRAVENOUS
  Administered 2019-11-04: 100 ug/min via INTRAVENOUS
  Administered 2019-11-04: 150 ug/min via INTRAVENOUS
  Filled 2019-11-03: qty 500
  Filled 2019-11-03 (×4): qty 250

## 2019-11-03 MED ORDER — SODIUM CHLORIDE 0.9 % IV SOLN
INTRAVENOUS | Status: DC
  Administered 2019-11-03: 16:00:00 via INTRAVENOUS

## 2019-11-03 MED ORDER — ONDANSETRON HCL 4 MG PO TABS: 4.0000 mg | ORAL_TABLET | Freq: Four times a day (QID) | ORAL | Status: DC | PRN

## 2019-11-03 NOTE — Progress Notes (Signed)
[facility] night shift.  The patient was seen due to agonal breathing and was intubated by Dr. Stark Jock.  Her intubation was uneventful.  However, about 20 minutes post intubation she went into asystole requiring alcohol 7-8 minutes of CPR, 3-1/2 amps of adrenaline, also received amp of sodium bicarb and we were able to restore the pulse.  However, the patient was unresponsive after this.  Her family was notified.  They decided to make her DNR/full care.  Arrangements were made to allow the family members to come to the hospital and see her.  She went into asystole at 530. Her family was at bedside.  Tennis Must, MD

## 2019-11-03 NOTE — ED Provider Notes (Signed)
[facility] EMERGENCY DEPARTMENT Provider Note   CSN: [id number] Arrival date & time: 10/24/2019  1232     History   Chief Complaint Chief Complaint  Patient presents with   Weakness    HPI [name] is a 85 y.o. female.     HPI   She presents for evaluation of altered mental status, and weakness, greater than usual.  She was in the ED yesterday and discharged with treatment for UTI, Keflex.  At that time was noted that her creatinine was higher than baseline and she was instructed to stop taking her Lasix.  Patient is unable to give any history.  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, speech issues, balance issues, hearing loss, swallowing issues   Dementia ([name])    "a little"   Depression    Diabetes mellitus    type 2   GERD (gastroesophageal reflux disease)    Hypertension    Iron deficiency anemia 05/27/2011   Myocardial infarction [facility])    Renal insufficiency    Scoliosis    Vertigo    "when sugar gets low"   Vision loss    left eye-"half blind"    Patient Active Problem List   Diagnosis Date Noted   HCAP (healthcare-associated pneumonia) 10/10/2019   Stage 3b chronic kidney disease 10/22/2019   Pleural effusion 10/22/2019   Palliative care by specialist    Hemorrhoids    Sepsis ([place])    Advanced care planning/counseling discussion    Goals of care, counseling/discussion    Altered mental status 10/03/2019   Atrial fibrillation with RVR ([place]) 10/03/2019   CHF (congestive heart failure) ([place]) 09/24/2019   Atrial  fibrillation with rapid ventricular response ([place]) 07/18/2019   Nocturnal enuresis 07/03/2019   Mixed stress and urge urinary incontinence 07/03/2019   Generalized weakness 06/28/2019   Symptomatic bradycardia 06/28/2019   Apnea 02/15/2019   Snoring 02/15/2019   Polyp of colon    Accelerated hypertension 01/20/2019   Rectal bleeding 11/08/2018   Rectal pain 11/08/2018   Abnormal CT scan, colon 11/08/2018   (HFpEF) heart failure with preserved ejection fraction ([place]) 10/09/2018   Acute on chronic diastolic CHF (congestive heart failure) ([place]) 10/09/2018   Aortic stenosis, moderate 10/09/2018   Mitral valve stenosis 10/09/2018   Acute exacerbation of CHF (congestive heart failure) ([place]) 10/07/2018   Acute hypoxemic respiratory failure ([place]) 10/07/2018   Chronic anemia 10/07/2018   Asthma 10/07/2018   CVA (cerebral vascular accident) ([place]) 10/07/2018   CAD (coronary artery disease) 10/07/2018   Constipation 10/07/2018   Physical deconditioning 10/07/2018   History of recurrent UTIs 10/07/2018   CKD stage 3 due to type 2 diabetes mellitus ([place]) 12/22/2017   Dyslipidemia 12/22/2017   Weakness 11/23/2017   Overweight (BMI 25.0-29.9) 05/08/2016   UTI (lower urinary tract infection) 11/12/2015   Type 2 diabetes mellitus with stage 3b chronic kidney disease, with long-term current use of insulin ([place]) 11/12/2015   Dementia (  [place])    Metabolic encephalopathy 36/64/4034   S/P right TKA 08/13/2015   S/P knee replacement 08/13/2015   Elevated troponin 03/06/2015   Fever    History of diabetes mellitus    Left buttock pain    Mixed hyperlipidemia 01/11/2015   GAD (generalized anxiety disorder) 01/11/2015   Depression 01/11/2015   GERD (gastroesophageal reflux disease) 01/11/2015   Osteopenia 07/13/2014   Obesity (BMI 30-39.9) 06/19/2014   Carotid stenosis    Anemia of chronic disease 12/25/2011   Iron deficiency anemia 74/25/9563    Diastolic CHF, chronic ([place]) 04/02/2010   Type 2 diabetes mellitus with insulin therapy ([place]) 10/08/2009   HLD (hyperlipidemia) 10/08/2009   HTN (hypertension) 10/08/2009   Coronary atherosclerosis 10/08/2009   CAROTID STENOSIS 10/08/2009    Past Surgical History:  Procedure Laterality Date   APPENDECTOMY     with hysterectomy   CATARACT EXTRACTION Bilateral 5 years ago   CHOLECYSTECTOMY     COLONOSCOPY  2005   Dr. Amedeo Plenty: Diverticulosis   COLONOSCOPY WITH PROPOFOL N/A 02/14/2019   Procedure: COLONOSCOPY WITH PROPOFOL;  Surgeon: Danie Binder, MD;  Location: [facility] ENDO SUITE;  Service: Endoscopy;  Laterality: N/A;   CORONARY ANGIOPLASTY     prior to 2006 5 stents   CORONARY ARTERY BYPASS GRAFT  2006   [facility] [place]  April 2013   KNEE SURGERY Right    PACEMAKER IMPLANT N/A 07/24/2019   Procedure: PACEMAKER IMPLANT;  Surgeon: Evans Lance, MD;  Location: [facility] LAB;  Service: Cardiovascular;  Laterality: N/A;   PACEMAKER INSERTION     POLYPECTOMY  02/14/2019   Procedure: POLYPECTOMY;  Surgeon: Danie Binder, MD;  Location: [facility] ENDO SUITE;  Service: Endoscopy;;  cecal polyp   TOTAL ABDOMINAL HYSTERECTOMY  ~85 years old   complete, with tumor removal   TOTAL KNEE ARTHROPLASTY Right 08/13/2015   Procedure: RIGHT  TOTAL KNEE ARTHROPLASTY;  Surgeon: Paralee Cancel, MD;  Location: [facility] ORS;  Service: Orthopedics;  Laterality: Right;     OB History    Gravida  4   Para  4   Term  4   Preterm      AB      Living  3     SAB      TAB      Ectopic      Multiple      Live Births               Home Medications    Prior to Admission medications   Medication Sig Start Date End Date Taking? Authorizing Provider  albuterol (PROVENTIL HFA;VENTOLIN HFA) 108 (90 Base) MCG/ACT inhaler Inhale 2 puffs into the lungs every 6 (six) hours as needed for wheezing or shortness of breath. 01/20/19  Yes Stacks, Cletus Gash, MD  apixaban (ELIQUIS) 2.5 MG TABS  tablet Take 1 tablet (2.5 mg total) by mouth 2 (two) times daily. 08/23/19  Yes Minus Breeding, MD  aspirin EC 325 MG tablet Take 325 mg by mouth daily.   Yes [provider]  atorvastatin (LIPITOR) 20 MG tablet TAKE 1 TABLET BY MOUTH AT BEDTIME. Patient taking differently: Take 20 mg by mouth at bedtime.  10/30/19  Yes Stacks, Cletus Gash, MD  Carboxymethylcellul-Glycerin (CLEAR EYES FOR DRY EYES) 1-0.25 % SOLN Place 1 drop into both eyes daily as needed (irritation).    Yes [provider]  clotrimazole-betamethasone (LOTRISONE) cream Apply 1 application topically 2 (two) times daily. Patient taking differently: Apply  1 application topically 2 (two) times daily as needed.  10/11/19  Yes [name], MD  diltiazem (CARDIZEM CD) 180 MG 24 hr capsule Take 1 capsule (180 mg total) by mouth daily. 10/16/19  Yes [name], MD  docusate sodium (COLACE) 100 MG capsule Take 1 capsule (100 mg total) by mouth 2 (two) times daily. Patient taking differently: Take 100 mg by mouth 2 (two) times daily as needed.  09/26/19  Yes Barton Dubois, MD  esomeprazole (NEXIUM) 40 MG capsule TAKE 1 CAPSULE DAILY. Patient taking differently: Take 40 mg by mouth daily.  10/30/19  Yes [name], MD  ferrous sulfate 324 (65 Fe) MG TBEC Take 324 mg by mouth every morning.    Yes [provider]  Insulin Detemir (LEVEMIR FLEXTOUCH) 100 UNIT/ML Pen INJECT [place] MORNING Patient taking differently: Inject 16 Units into the skin daily. INJECT 24 UNITS EACH MORNING 10/30/19  Yes Stacks, Cletus Gash, MD  insulin lispro (HUMALOG) 100 UNIT/ML injection Check glucose before each meal (TID). If over 200 take 5 units. If over 300 take 8 units. If over 400 take 10 units. 09/27/19  Yes [name], MD  magnesium hydroxide (MILK OF MAGNESIA) 400 MG/5ML suspension Take 30 mLs by mouth daily as needed for moderate constipation. 09/26/19  Yes Barton Dubois, MD  methenamine (MANDELAMINE) 1 g tablet Take 1  tablet (1,000 mg total) by mouth 2 (two) times daily. 10/16/19  Yes [name], MD  metoprolol succinate (TOPROL-XL) 100 MG 24 hr tablet Take 1 tablet (100 mg total) by mouth daily. For blood pressure control 09/15/19  Yes Stacks, Cletus Gash, MD  Multiple Vitamins-Iron (DAILY VITAMINS/IRON/BETA CAROT PO) Take 1 tablet by mouth at bedtime.   Yes [provider]  naproxen sodium (ALEVE) 220 MG tablet Take 440 mg by mouth daily as needed.   Yes [provider]  nitroGLYCERIN (NITROSTAT) 0.4 MG SL tablet PLACE 1 TAB UNDER TONGUE EVERY 3 MINUTES AS DIRECTED UP TO 3 TIMES ASNEEDED FOR CHEST PAIN. Patient taking differently: Place 0.4 mg under the tongue every 5 (five) minutes as needed.  10/02/19  Yes Stacks, Cletus Gash, MD  PARoxetine (PAXIL) 20 MG tablet TAKE 1 TABLET BY MOUTH EVERY MORNING. Patient taking differently: Take 20 mg by mouth daily.  10/30/19  Yes Stacks, Cletus Gash, MD  polyethylene glycol (MIRALAX) 17 g packet Take 17 g by mouth 2 (two) times daily. Patient taking differently: Take 17 g by mouth 2 (two) times daily as needed.  09/26/19  Yes Barton Dubois, MD  ramipril (ALTACE) 5 MG capsule Take 1 capsule (5 mg total) by mouth daily. 09/06/19  Yes Stacks, Cletus Gash, MD  cephALEXin (KEFLEX) 500 MG capsule Take 1 capsule (500 mg total) by mouth 2 (two) times daily. Patient not taking: Reported on 10/27/2019 11/02/19   Virgel Manifold, MD  ciprofloxacin (CIPRO) 500 MG tablet Take 1 tablet (500 mg total) by mouth 2 (two) times daily. One po bid x 7 days Patient not taking: Reported on 10/14/2019 10/26/19   [name], MD  furosemide (LASIX) 20 MG tablet Take 40 mg by mouth daily. Patient may take an additional 20-40mg  a day if needed for swelling    [provider]    Family History Family History  Problem Relation Age of Onset   Cancer Brother        porstate   Early death Sister    Heart disease Brother    Heart disease Brother    Heart attack Brother  Heart disease Brother    Heart attack Brother    Diabetes Sister    Heart attack Sister    Diabetes Sister    Osteoporosis Sister    Hypertension Sister    Heart attack Son    Early death Son    Pancreatitis Son    Heart attack Daughter 58    Social History Social History   Tobacco Use   Smoking status: Never Smoker   Smokeless tobacco: Never Used  Substance Use Topics   Alcohol use: No   Drug use: No     Allergies   Advil [ibuprofen], Heparin, Propoxyphene n-acetaminophen, and Acetaminophen   Review of Systems Review of Systems  All other systems reviewed and are negative.    Physical Exam Updated Vital Signs BP (!) 125/96 (BP Location: Left Arm)    Pulse (!) 106    Temp 97.8 F (36.6 C) (Oral)    Resp 16    Ht 5\' 5"  (1.651 m)    Wt 67.6 kg    SpO2 92%    BMI 24.79 kg/m   Physical Exam Vitals signs and nursing note reviewed.  Constitutional:      General: She is not in acute distress.    Appearance: She is well-developed. She is ill-appearing. She is not toxic-appearing or diaphoretic.  HENT:     Head: Normocephalic and atraumatic.     Right Ear: External ear normal.     Left Ear: External ear normal.     Nose: No congestion or rhinorrhea.  Eyes:     Conjunctiva/sclera: Conjunctivae normal.     Pupils: Pupils are equal, round, and reactive to light.  Neck:     Musculoskeletal: Normal range of motion and neck supple.     Trachea: Phonation normal.  Cardiovascular:     Rate and Rhythm: Normal rate and regular rhythm.     Heart sounds: Normal heart sounds. No murmur.  Pulmonary:     Effort: Pulmonary effort is normal. No respiratory distress.     Breath sounds: Normal breath sounds. No stridor.  Abdominal:     General: There is no distension.     Palpations: Abdomen is soft.     Tenderness: There is no abdominal tenderness.  Musculoskeletal: Normal range of motion.     Right lower leg: Edema present.  Skin:    General: Skin is warm and  dry.  Neurological:     Mental Status: She is alert.     Cranial Nerves: No cranial nerve deficit.     Motor: No abnormal muscle tone.     Coordination: Coordination normal.     Comments: Left facial asymmetry/weakness.  Normal grip strength bilaterally.  She follows commands.  She is confused.  Possible dysarthria present.  Psychiatric:        Mood and Affect: Mood normal.        Behavior: Behavior normal.      ED Treatments / Results  Labs (all labs ordered are listed, but only abnormal results are displayed) Labs Reviewed  COMPREHENSIVE METABOLIC PANEL - Abnormal; Notable for the following components:      Result Value   Glucose, Bld 203 (*)    BUN 64 (*)    Creatinine, Ser 2.16 (*)    Total Protein 6.3 (*)    Albumin 3.3 (*)    GFR calc non Af Amer 20 (*)    GFR calc Af Amer 23 (*)    All other components within normal limits  CBC WITH DIFFERENTIAL/PLATELET - Abnormal; Notable for the following components:   RDW 19.3 (*)    Platelets 127 (*)    Lymphs Abs 0.5 (*)    All other components within normal limits  SARS CORONAVIRUS 2 (TAT 6-24 HRS)  URINALYSIS, ROUTINE W REFLEX MICROSCOPIC    EKG EKG Interpretation  Date/Time:  Friday November 03 2019 13:38:14 EST Ventricular Rate:  116 PR Interval:    QRS Duration: 149 QT Interval:  375 QTC Calculation: 521 R Axis:   -166 Text Interpretation: Atrial fibrillation Right bundle branch block since last tracing no significant change Confirmed by Daleen Bo [id number]) on 10/12/2019 2:25:54 PM   Radiology Ct Head Wo Contrast  Result Date: 11/02/2019 CLINICAL DATA:  Altered level of consciousness (LOC), unexplained. Additional history provided by patient's daughter: New onset leaning to the right in today with extremity edema. EXAM: CT HEAD WITHOUT CONTRAST TECHNIQUE: Contiguous axial images were obtained from the base of the skull through the vertex without intravenous contrast. COMPARISON:  Head CT 10/26/2019 FINDINGS:  Brain: No evidence of acute intracranial hemorrhage. Redemonstrated chronic cortically based infarct within the right occipital lobe and chronic right cerebellar infarct. No new demarcated infarct is identified. No evidence of intracranial mass. No midline shift or extra-axial fluid collection. Advanced patchy and confluent hypodensity within cerebral white matter is nonspecific, but consistent with chronic small vessel ischemic disease. Moderate generalized parenchymal atrophy Vascular: No hyperdense vessel.  Atherosclerotic calcifications. Skull: Normal. Negative for fracture or focal lesion. Sinuses/Orbits: Visualized orbits demonstrate no acute abnormality. Mucosal thickening within the partially imaged maxillary sinuses. No significant mastoid effusion. IMPRESSION: 1. No evidence of acute intracranial abnormality. 2. Redemonstrated chronic right occipital lobe and right cerebellar infarcts. 3. Background of generalized parenchymal atrophy with advanced chronic small vessel ischemic disease. Electronically Signed   By: Kellie Simmering DO   On: 11/02/2019 18:49   Dg Chest Port 1 View  Result Date: 10/17/2019 CLINICAL DATA:  Weakness EXAM: PORTABLE CHEST 1 VIEW COMPARISON:  October 26, 2019 FINDINGS: There is a right pleural effusion with consolidation in portions of the right middle and lower lobes. There is scattered atelectatic change elsewhere bilaterally. Heart is upper normal in size with mild pulmonary venous hypertension. Patient is status post coronary artery bypass grafting. Pacemaker lead tips are attached to the right atrium and right ventricle. There is aortic atherosclerosis. No adenopathy. There is degenerative change in each shoulder. IMPRESSION: 1. Right pleural effusion with consolidation in portions of the right middle and lower lobes. Suspect right base region pneumonia. 2. Scattered atelectatic change bilaterally. 3. Mild pulmonary venous hypertension. 4. Status post coronary artery bypass  grafting. Pacemaker lead tips attached to right atrium and right ventricle. 5.  Aortic Atherosclerosis (ICD10-I70.0). Electronically Signed   By: Lowella Grip III M.D.   On: 10/23/2019 14:03    Procedures .Critical Care Performed by: Daleen Bo, MD Authorized by: Daleen Bo, MD   Critical care provider statement:    Critical care time (minutes):  35   Critical care start time:  10/25/2019 1:45 PM   Critical care end time:  10/11/2019 3:48 PM   Critical care time was exclusive of:  Separately billable procedures and treating other patients   Critical care was necessary to treat or prevent imminent or life-threatening deterioration of the following conditions:  Respiratory failure and sepsis   Critical care was time spent personally by me on the following activities:  Blood draw for specimens, development of treatment plan with patient or  surrogate, discussions with consultants, evaluation of patient's response to treatment, examination of patient, obtaining history from patient or surrogate, ordering and performing treatments and interventions, ordering and review of laboratory studies, pulse oximetry, re-evaluation of patient's condition, review of old charts and ordering and review of radiographic studies   (including critical care time)  Medications Ordered in ED Medications  sodium chloride 0.9 % bolus 500 mL (has no administration in time range)  0.9 %  sodium chloride infusion (has no administration in time range)  cefTRIAXone (ROCEPHIN) 1 g in sodium chloride 0.9 % 100 mL IVPB (has no administration in time range)  azithromycin (ZITHROMAX) 500 mg in sodium chloride 0.9 % 250 mL IVPB (has no administration in time range)  sodium chloride 0.9 % bolus 1,000 mL (1,000 mLs Intravenous New Bag/Given 10/18/2019 1439)     Initial Impression / Assessment and Plan / ED Course  I have reviewed the triage vital signs and the nursing notes.  Pertinent labs & imaging results that  were available during my care of the patient were reviewed by me and considered in my medical decision making (see chart for details).  Clinical Course as of Nov 02 1548  Fri Nov 03, 2019  1504 I discussed the situation with patient's daughter [name], who is now in the room.  She is concerned because early this morning the patient was "acting funny, sleeping more than usual, confused, not following commands, groaning and seemed weak.  Therefore she called EMS to transfer her here for evaluation.   [EW]  1513 Normal  CBC with Differential(!) [EW]  1533 Normal except elevated glucose, BUN, and creatinine.  Also low protein, albumin, and GFR.  Comprehensive metabolic panel(!) [EW]  4128 Normal except platelets low  CBC with Differential(!) [EW]  1538 Right lung pneumonia versus effusion.  Image interpreted by me.  DG Chest Port 1 View [EW]    Clinical Course User Index [EW] Daleen Bo, MD        Patient Vitals for the past 24 hrs:  BP Temp Temp src Pulse Resp SpO2 Height Weight  10/28/2019 1344 (!) 125/96 -- -- (!) 106 16 92 % -- --  10/22/2019 1258 -- -- -- -- -- -- 5\' 5"  (1.651 m) 67.6 kg  10/15/2019 1257 (!) 152/88 97.8 F (36.6 C) Oral (!) 103 12 98 % -- --    3:41 PM Reevaluation with update and discussion. After initial assessment and treatment, an updated evaluation reveals no change in clinical status, patient's daughter updated on findings and plan. Daleen Bo   Medical Decision Making: Patient with current treatment for UTI with Keflex, presenting for worsening weakness, confusion, and inability to help her self at home.  Renal function somewhat improved since yesterday.  Possible new pneumonia, consider aspiration as a source.  Patient not thriving with current treatment and will require admission for further treatment and observation.  Ellena Kamen Flippo was evaluated in Emergency Department on 10/08/2019 for the symptoms described in the history of present illness. She was  evaluated in the context of the global COVID-19 pandemic, which necessitated consideration that the patient might be at risk for infection with the SARS-CoV-2 virus that causes COVID-19. Institutional protocols and algorithms that pertain to the evaluation of patients at risk for COVID-19 are in a state of rapid change based on information released by regulatory bodies including the CDC and federal and state organizations. These policies and algorithms were followed during the patient's care in the ED.   CRITICAL  CARE-yes Performed by: Daleen Bo    Final Clinical Impressions(s) / ED Diagnoses   Final diagnoses:  Urinary tract infection without hematuria, site unspecified  Dehydration  Confusion  Weakness    ED Discharge Orders    None       Daleen Bo, MD 10/13/2019 1550

## 2019-11-03 NOTE — ED Triage Notes (Signed)
Pt arrived via EMS from home. Per pt's daughter she has been acting more altered and weak than normal. Pt was just discharged from ED yesterday November 13, 2019 for hyperglycemia. CBG reported by EMS was 186.

## 2019-11-03 NOTE — H&P (Addendum)
History and Physical    [name] NWG:[id number] DOB: December 09, 1930 DOA: 10/14/2019  PCP: [name], MD   Patient coming from: Home  I have personally briefly reviewed patient's old medical records in [place]  Chief Complaint: Altered mental status  HPI: [name] is a 85 y.o. female with medical history significant for atrial fibrillation, diastolic CHF, CKD 3, hypertension, diabetes mellitus, coronary artery disease, depression.  Patient was brought to the ED via EMS with reports of altered mental status, and increasing weakness.  History is obtained from patient's daughter as patient is confused at this time.  Patient's daughter reports that over the past 1 to 2 days patient has been confused, unable to follow directions, but to stand up this morning, and kept referring to her daughter has her mother.  She denies any difficulty breathing or cough.  Lower extremity swelling has improved over the past 2 days.  Patient also reported some burning with urination.  No fever no chills.  Appetite is at baseline, no vomiting no loose stools.  Patient was in the ED yesterday for hyperglycemia, patient had mild bump in creatinine also.  Also reported generalized weakness, confusion and swelling of extremities.  Blood glucose in the ED was 229.  She was told to hold her Lasix for now.  2 recent hospitalizations in October. 10/27 - 57/84-ONG acute metabolic encephalopathy thought secondary to UTI and dehydration.  Treated with IV fluids and IV Rocephin.  Also with atrial fibrillation with RVR.  10/16 - 10/20-for acute on chronic diastolic CHF, responded well to IV diuresis, discharged home on Lasix.  ED Course: Blood pressure systolic initially 295M to 150s, gradually dropped to 86/71.  O2 sats greater than 90% on room air.  Intermittent tachypnea to 28.  1.5 L bolus given in ED.  WBC 4.8.  Showed right pleural effusion with consolidation in portions of the right middle and lower  lobes.  Suspect right base region pneumonia.  Head CT showed chronic infarcts in the right cerebellar hemisphere and right occipital lobe, negative for acute abnormality.  Covid test pending patient started on IV ceftriaxone and azithromycin.  Hospitalist to admit for pneumonia, altered mental status.  Review of Systems: Unable to assess due to patient's altered mental status.  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 Amedeo Plenty: Diverticulosis   COLONOSCOPY WITH PROPOFOL N/A 02/14/2019   Procedure: COLONOSCOPY WITH PROPOFOL;  Surgeon: Danie Binder, MD;  Location: [facility];  Service: Endoscopy;  Laterality: N/A;   CORONARY ANGIOPLASTY     prior to 2006 5 stents   CORONARY ARTERY BYPASS GRAFT  2006   [facility] [place]  April 2013   KNEE SURGERY Right    PACEMAKER IMPLANT N/A 07/24/2019   Procedure: PACEMAKER IMPLANT;  Surgeon: Evans Lance, MD;  Location: [place] CV LAB;  Service: Cardiovascular;  Laterality: N/A;   PACEMAKER  INSERTION     POLYPECTOMY  02/14/2019   Procedure: POLYPECTOMY;  Surgeon: Danie Binder, MD;  Location: [facility];  Service: Endoscopy;;  cecal polyp   TOTAL ABDOMINAL HYSTERECTOMY  ~85 years old   complete, with tumor removal   TOTAL KNEE ARTHROPLASTY Right 08/13/2015   Procedure: RIGHT  TOTAL KNEE ARTHROPLASTY;  Surgeon: Paralee Cancel, MD;  Location: [facility] ORS;  Service: Orthopedics;  Laterality: Right;     reports that she has never smoked. She has never used smokeless tobacco. She reports that she does not drink alcohol or use drugs.  Allergies  Allergen Reactions   Advil [Ibuprofen] Swelling   Heparin Other (See Comments)    Confusion, hallucinations.    Propoxyphene N-Acetaminophen Other (See Comments)    Numbness all over. "floating" sensation.   Acetaminophen Rash    Family History  Problem Relation Age of Onset   Cancer Brother        porstate   Early death Sister    Heart disease Brother    Heart disease Brother    Heart attack Brother    Heart disease Brother    Heart attack Brother    Diabetes Sister    Heart attack Sister    Diabetes Sister    Osteoporosis Sister    Hypertension Sister    Heart attack Son    Early death Son    Pancreatitis Son    Heart attack Daughter 1     Prior to Admission medications   Medication Sig Start Date End Date Taking? Authorizing Provider  albuterol (PROVENTIL HFA;VENTOLIN HFA) 108 (90 Base) MCG/ACT inhaler Inhale 2 puffs into the lungs every 6 (six) hours as needed for wheezing or shortness of breath. 01/20/19  Yes Stacks, Cletus Gash, MD  apixaban (ELIQUIS) 2.5 MG TABS tablet Take 1 tablet (2.5 mg total) by mouth 2 (two) times daily. 08/23/19  Yes Minus Breeding, MD  aspirin EC 325 MG tablet Take 325 mg by mouth daily.   Yes [provider]  atorvastatin (LIPITOR) 20 MG tablet TAKE 1 TABLET BY MOUTH AT BEDTIME. Patient taking differently: Take 20 mg by mouth at bedtime.  10/30/19  Yes Stacks,  Cletus Gash, MD  Carboxymethylcellul-Glycerin (CLEAR EYES FOR DRY EYES) 1-0.25 % SOLN Place 1 drop into both eyes daily as needed (irritation).    Yes [provider]  clotrimazole-betamethasone (LOTRISONE) cream Apply 1 application topically 2 (two) times daily. Patient taking differently: Apply 1 application topically 2 (two) times daily as needed.  10/11/19  Yes [name], MD  diltiazem (CARDIZEM CD) 180 MG 24 hr capsule Take 1 capsule (180 mg total) by mouth daily. 10/16/19  Yes [name], MD  docusate sodium (COLACE) 100 MG capsule Take 1 capsule (100 mg total) by mouth 2 (two) times daily. Patient taking differently: Take 100 mg by mouth 2 (two) times daily as  needed.  09/26/19  Yes Barton Dubois, MD  esomeprazole (NEXIUM) 40 MG capsule TAKE 1 CAPSULE DAILY. Patient taking differently: Take 40 mg by mouth daily.  10/30/19  Yes [name], MD  ferrous sulfate 324 (65 Fe) MG TBEC Take 324 mg by mouth every morning.    Yes [provider]  Insulin Detemir (LEVEMIR FLEXTOUCH) 100 UNIT/ML Pen INJECT [place] MORNING Patient taking differently: Inject 16 Units into the skin daily. INJECT 24 UNITS EACH MORNING 10/30/19  Yes Stacks, Cletus Gash, MD  insulin lispro (HUMALOG) 100 UNIT/ML injection Check glucose before each meal (TID). If over 200 take 5 units. If over 300 take 8 units. If over 400 take 10 units. 09/27/19  Yes [name], MD  magnesium hydroxide (MILK OF MAGNESIA) 400 MG/5ML suspension Take 30 mLs by mouth daily as needed for moderate constipation. 09/26/19  Yes Barton Dubois, MD  methenamine (MANDELAMINE) 1 g tablet Take 1 tablet (1,000 mg total) by mouth 2 (two) times daily. 10/16/19  Yes [name], MD  metoprolol succinate (TOPROL-XL) 100 MG 24 hr tablet Take 1 tablet (100 mg total) by mouth daily. For blood pressure control 09/15/19  Yes Stacks, Cletus Gash, MD  Multiple Vitamins-Iron (DAILY VITAMINS/IRON/BETA CAROT PO) Take 1 tablet by mouth at bedtime.    Yes [provider]  naproxen sodium (ALEVE) 220 MG tablet Take 440 mg by mouth daily as needed.   Yes [provider]  nitroGLYCERIN (NITROSTAT) 0.4 MG SL tablet PLACE 1 TAB UNDER TONGUE EVERY 3 MINUTES AS DIRECTED UP TO 3 TIMES ASNEEDED FOR CHEST PAIN. Patient taking differently: Place 0.4 mg under the tongue every 5 (five) minutes as needed.  10/02/19  Yes Stacks, Cletus Gash, MD  PARoxetine (PAXIL) 20 MG tablet TAKE 1 TABLET BY MOUTH EVERY MORNING. Patient taking differently: Take 20 mg by mouth daily.  10/30/19  Yes Stacks, Cletus Gash, MD  polyethylene glycol (MIRALAX) 17 g packet Take 17 g by mouth 2 (two) times daily. Patient taking differently: Take 17 g by mouth 2 (two) times daily as needed.  09/26/19  Yes Barton Dubois, MD  ramipril (ALTACE) 5 MG capsule Take 1 capsule (5 mg total) by mouth daily. 09/06/19  Yes Stacks, Cletus Gash, MD  cephALEXin (KEFLEX) 500 MG capsule Take 1 capsule (500 mg total) by mouth 2 (two) times daily. Patient not taking: Reported on 10/24/2019 11/02/19   Virgel Manifold, MD  ciprofloxacin (CIPRO) 500 MG tablet Take 1 tablet (500 mg total) by mouth 2 (two) times daily. One po bid x 7 days Patient not taking: Reported on 10/23/2019 10/26/19   [name], MD  furosemide (LASIX) 20 MG tablet Take 40 mg by mouth daily. Patient may take an additional 20-40mg  a day if needed for swelling    [provider]    Physical Exam: Exam limited by patient's altered mental status Vitals:   11/02/2019 1655 10/22/2019 1700 11/02/2019 1730 10/14/2019 1800  BP: 104/86  (!) 87/54   Pulse: (!) 30 77  (!) 38  Resp: 20 20  (!) 28  Temp:      TempSrc:      SpO2: 90% 92%  91%  Weight:      Height:        Constitutional: Awake, speech is confused. Vitals:   10/12/2019 1655 10/31/2019 1700 10/21/2019 1730 10/08/2019 1800  BP: 104/86  (!) 87/54   Pulse: (!) 30 77  (!) 38  Resp: 20 20  (!) 28  Temp:  TempSrc:      SpO2: 90% 92%  91%  Weight:      Height:        Eyes: PERRL, lids and conjunctivae normal ENMT: Mucous membranes are dry. Posterior pharynx clear of any exudate or lesions.  Neck: normal, supple, no masses, no thyromegaly Respiratory: clear to auscultation bilaterally, no wheezing, no crackles. Normal respiratory effort. No accessory muscle use.  Cardiovascular: Regular rate and rhythm, no murmurs / rubs / gallops.  + 1 Bilateral pitting pedal edema, R > L ,    2+ pedal pulses.  Abdomen: no tenderness, no masses palpated. No hepatosplenomegaly. Bowel sounds positive.  Musculoskeletal: no clubbing / cyanosis. No joint deformity upper and lower extremities. Good ROM, no contractures. Normal muscle tone.  Skin: Senile purpuric areas to bilateral forearms, no rashes, lesions, ulcers. Neurologic: Exam limited by patient's mental status, no apparent cranial nerve abnormality, moving extremities spontaneously unable to assess strength. Psychiatric: Awake and alert, unable to tell me her name or where she is.  She is talking but her speech appears confused.  Labs on Admission: I have personally reviewed following labs and imaging studies  CBC: Recent Labs  Lab 11/02/19 1716 10/27/2019 1403  WBC 5.1 4.8  NEUTROABS 4.0 4.0  HGB 11.7* 12.9  HCT 38.4 42.7  MCV 90.4 91.2  PLT 133* 573*   Basic Metabolic Panel: Recent Labs  Lab 11/02/19 1716 10/11/2019 1403  NA 138 138  K 3.4* 3.9  CL 100 98  CO2 25 26  GLUCOSE 229* 203*  BUN 68* 64*  CREATININE 2.40* 2.16*  CALCIUM 8.7* 9.0   Liver Function Tests: Recent Labs  Lab 10/10/2019 1403  AST 23  ALT 23  ALKPHOS 51  BILITOT 0.8  PROT 6.3*  ALBUMIN 3.3*   CBG: Recent Labs  Lab 11/02/19 1650  GLUCAP 220*   Urine analysis:    Component Value Date/Time   COLORURINE YELLOW 10/15/2019 1348   APPEARANCEUR CLOUDY (A) 10/29/2019 1348   APPEARANCEUR Clear 10/16/2019 1154   LABSPEC 1.017 10/19/2019 1348   PHURINE 5.0 10/10/2019 1348   GLUCOSEU NEGATIVE 10/26/2019 1348   HGBUR LARGE  (A) 10/29/2019 [place] 11/05/2019 1348   BILIRUBINUR Negative 10/16/2019 1154   KETONESUR NEGATIVE 10/14/2019 1348   PROTEINUR 100 (A) 10/17/2019 1348   UROBILINOGEN negative 02/03/2016 1516   UROBILINOGEN 0.2 10/17/2015 1915   NITRITE NEGATIVE 10/11/2019 1348   LEUKOCYTESUR MODERATE (A) 10/28/2019 1348    Radiological Exams on Admission: Ct Head Wo Contrast  Result Date: 11/01/2019 CLINICAL DATA:  Altered mental status.  Weakness. EXAM: CT HEAD WITHOUT CONTRAST TECHNIQUE: Contiguous axial images were obtained from the base of the skull through the vertex without intravenous contrast. COMPARISON:  November 02, 2019 FINDINGS: Brain: No subdural, epidural, or subarachnoid hemorrhage are identified. Ventricles and sulci are stable. Chronic infarct is again seen in the right cerebellar hemisphere and the right occipital lobe. White matter changes are scattered and stable. No acute cortical ischemia or infarct. No mass effect or midline shift. Basal cisterns are stable. Vascular: Calcified atherosclerosis is seen in the intracranial carotids. Skull: Normal. Negative for fracture or focal lesion. Sinuses/Orbits: Mucosal thickening is seen in the left maxillary sinus. Paranasal sinuses, mastoid air cells, and middle ears are otherwise well aerated. Other: None. IMPRESSION: 1. Chronic infarcts in the right cerebellar hemisphere and the right occipital lobe. No acute intracranial abnormalities identified. 2. Mucosal thickening in the left maxillary sinus, stable. Electronically Signed   By:  Dorise Bullion III M.D   On: 10/27/2019 15:38   Ct Head Wo Contrast  Result Date: 11/02/2019 CLINICAL DATA:  Altered level of consciousness (LOC), unexplained. Additional history provided by patient's daughter: New onset leaning to the right in today with extremity edema. EXAM: CT HEAD WITHOUT CONTRAST TECHNIQUE: Contiguous axial images were obtained from the base of the skull through the vertex  without intravenous contrast. COMPARISON:  Head CT 10/26/2019 FINDINGS: Brain: No evidence of acute intracranial hemorrhage. Redemonstrated chronic cortically based infarct within the right occipital lobe and chronic right cerebellar infarct. No new demarcated infarct is identified. No evidence of intracranial mass. No midline shift or extra-axial fluid collection. Advanced patchy and confluent hypodensity within cerebral white matter is nonspecific, but consistent with chronic small vessel ischemic disease. Moderate generalized parenchymal atrophy Vascular: No hyperdense vessel.  Atherosclerotic calcifications. Skull: Normal. Negative for fracture or focal lesion. Sinuses/Orbits: Visualized orbits demonstrate no acute abnormality. Mucosal thickening within the partially imaged maxillary sinuses. No significant mastoid effusion. IMPRESSION: 1. No evidence of acute intracranial abnormality. 2. Redemonstrated chronic right occipital lobe and right cerebellar infarcts. 3. Background of generalized parenchymal atrophy with advanced chronic small vessel ischemic disease. Electronically Signed   By: Kellie Simmering DO   On: 11/02/2019 18:49   Dg Chest Port 1 View  Result Date: 10/14/2019 CLINICAL DATA:  Weakness EXAM: PORTABLE CHEST 1 VIEW COMPARISON:  October 26, 2019 FINDINGS: There is a right pleural effusion with consolidation in portions of the right middle and lower lobes. There is scattered atelectatic change elsewhere bilaterally. Heart is upper normal in size with mild pulmonary venous hypertension. Patient is status post coronary artery bypass grafting. Pacemaker lead tips are attached to the right atrium and right ventricle. There is aortic atherosclerosis. No adenopathy. There is degenerative change in each shoulder. IMPRESSION: 1. Right pleural effusion with consolidation in portions of the right middle and lower lobes. Suspect right base region pneumonia. 2. Scattered atelectatic change bilaterally. 3.  Mild pulmonary venous hypertension. 4. Status post coronary artery bypass grafting. Pacemaker lead tips attached to right atrium and right ventricle. 5.  Aortic Atherosclerosis (ICD10-I70.0). Electronically Signed   By: Lowella Grip III M.D.   On: 10/24/2019 14:03    EKG: Independently reviewed.  Atrial fibrillation, rate 116, QTc 521.  Assessment/Plan Active Problems:   HCAP (healthcare-associated pneumonia)   Healthcare associated pneumonia-chest x-ray shows right middle lower lobe consolidation also a right pleural effusion.  Daughter denies respiratory complaints.  Afebrile, later became hypothermic, 95.9, WBC 4.8. Sats > 90% on room air.  Now hypotensive down to 69/47.  Patient rules in for sepsis. 1.5 L bolus normal saline given. IV ceftriaxone and azithromycin given in ED. -Will start IV vancomycin and cefepime, pharm to dose -Give 1 L bolus, totaling 2.5 L bolus, improved for now, Goal MAP > 65, if further hypotension, will need to initiate pressors.  - Cont N/s 100cc/hr x 12 hrs -Obtain blood cultures, and urine cultures -Obtain lactic acid- elevated at 4.5 -BMP, CBC a.m. -Addendum- patient re-evaluated, patient awake, but with persistent hypotension, despite IVF, will initiate phenylephrine via peripheral line for now. Talked to [place] provider, they will be up to put in a central line. Plans to initiate Levofed via Central line.  Metabolic encephalopathy-confusion.  Head CT shows chronic infarcts.  Likely secondary to HCAP.  UA also suggest UTI, with leukocytes, greater than 50 WBC rare bacteria, daughter reports dysuria. -Continue IV Vanco and cefepime -Recent urine cultures 10/03/2019 grew >  100 000 pansensitive E. coli.  Atrial fibrillation-rate controlled, and anticoagulated. -Hold home Cardizem and metoprolol with hypotension -Resume home Eliquis   Diastolic CHF- Stable and compensated.Septic, hypotensive,  Needs IVF.  Last echo 06/2019 EF 60 to 59%, LV diastolic  parameters consistent with pseudonormalization. -Hold home Lasix, hydrate, requiring pressors.  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 HgbA1c 7.5 -Resume home Levemir - SSI  Hypertension-currently hypotensive -Hold home metoprolol, Cardizem, ramipril  Prolonged QTC-521.  Potassium 4.8.  Home medications include paroxetine. -Check magnesium  AKI on CKD- Cr 2.1,baseline 1.4-1.7, likely pre-renal in the setting of sepsis. - Hold home lasix for now - Hydrate - BMP a.m  DVT prophylaxis: Eliquis Code Status: Full code.  Previous documentation stated DNR, talked to daughter on the phone she wants patient to be full code, wants central line placed if needed.  Family Communication: Daughter Chief Strategy Officer on the phone- severity of illness and plan of care explained, all questions answered. Disposition Plan: > 2 days Consults called: None. Admission status: Inpatient, stepdown I certify that at the point of admission it is my clinical judgment that the patient will require inpatient hospital care spanning beyond 2 midnights from the point of admission due to high intensity of service, high risk for further deterioration and high frequency of surveillance required. The following factors support the patient status of inpatient: HCAP requiring IV antibiotics now with hypotension requiring IV fluids and close monitoring.   Bethena Roys MD [facility]  11/06/2019, 9:26 PM

## 2019-11-03 NOTE — Progress Notes (Addendum)
Attempted to call [name], patient's daughter, to obtain permission to start a central line in order to administer medicines to stabilize patient's blood pressure. Spoke with next contact, Lanny Hurst and discussed placing a central line, also discussed that patient is a full code and do they wish [place] to make all attempts for patient, should her health decline. Lanny Hurst advised that he would call me back after speaking with his wife.   Lanny Hurst called back and gave verbal permission over the phone with Jeanice Lim as a witness.   Pt is still on warming blanket. Last rectal temp is 95.8. No urine output

## 2019-11-03 NOTE — Progress Notes (Signed)
Pharmacy Antibiotic Note  [name] is a 85 y.o. female admitted on 10/31/2019 with pneumonia.  Pharmacy has been consulted for vanc/cefepime dosing.  Pt presented with AMS. She has a hx of CKD. She had a recent admission for encephalopapthy and UTI. Daughter reports dysuria. Vanc/cefepime ordered empirically. Her recent urine cx showed pseudomonas (I) gent. She has had quite a few urine cultures in the past.   Scr 2.16 CrCl~17  Plan: Vanc 1g IV x1 then q48>>AUC 514, scr 2.16 Cefepime 2g IV q24 F/u with levels if needed Repeat MRSA PCR  Height: 5\' 6"  (167.6 cm) Weight: 153 lb (69.4 kg) IBW/kg (Calculated) : 59.3  Temp (24hrs), Avg:97.8 F (36.6 C), Min:97.8 F (36.6 C), Max:97.8 F (36.6 C)  Recent Labs  Lab 11/02/19 1716 10/16/2019 1403 10/25/2019 1808  WBC 5.1 4.8  --   CREATININE 2.40* 2.16*  --   LATICACIDVEN  --   --  4.5*    Estimated Creatinine Clearance: 17.5 mL/min (A) (by C-G formula based on SCr of 2.16 mg/dL (H)).    Allergies  Allergen Reactions  . Advil [Ibuprofen] Swelling  . Heparin Other (See Comments)    Confusion, hallucinations.   . Propoxyphene N-Acetaminophen Other (See Comments)    Numbness all over. "floating" sensation.  . Acetaminophen Rash    Antimicrobials this admission: 11/27 vanc>> 11/27 cefepime>> Dose adjustments this admission:   Microbiology results: 11/27 blood>> 11/26 urine>> 11/27 MRSA PCR>>  Onnie Boer, PharmD, [place], AAHIVP, CPP Infectious Disease Pharmacist 11/05/2019 7:44 PM

## 2019-11-03 NOTE — Progress Notes (Signed)
Patient's lactic 4.5. Paged midlevel to make aware. 500 ml bolus started per orders.

## 2019-11-04 LAB — BLOOD GAS, ARTERIAL
Acid-base deficit: 19.8 mmol/L — ABNORMAL HIGH (ref 0.0–2.0)
Bicarbonate: 9.5 mmol/L — ABNORMAL LOW (ref 20.0–28.0)
FIO2: 100
O2 Saturation: 98 %
Patient temperature: 36
pCO2 arterial: 29.7 mmHg — ABNORMAL LOW (ref 32.0–48.0)
pH, Arterial: 7.073 — CL (ref 7.350–7.450)
pO2, Arterial: 199 mmHg — ABNORMAL HIGH (ref 83.0–108.0)

## 2019-11-04 LAB — CBC
HCT: 32.4 % — ABNORMAL LOW (ref 36.0–46.0)
Hemoglobin: 9 g/dL — ABNORMAL LOW (ref 12.0–15.0)
MCH: 27.6 pg (ref 26.0–34.0)
MCHC: 27.8 g/dL — ABNORMAL LOW (ref 30.0–36.0)
MCV: 99.4 fL (ref 80.0–100.0)
Platelets: 47 10*3/uL — ABNORMAL LOW (ref 150–400)
RBC: 3.26 MIL/uL — ABNORMAL LOW (ref 3.87–5.11)
RDW: 19.5 % — ABNORMAL HIGH (ref 11.5–15.5)
WBC: 10.5 10*3/uL (ref 4.0–10.5)
nRBC: 0.7 % — ABNORMAL HIGH (ref 0.0–0.2)

## 2019-11-04 LAB — COMPREHENSIVE METABOLIC PANEL
ALT: 2114 U/L — ABNORMAL HIGH (ref 0–44)
AST: 3432 U/L — ABNORMAL HIGH (ref 15–41)
Albumin: 2 g/dL — ABNORMAL LOW (ref 3.5–5.0)
Alkaline Phosphatase: 91 U/L (ref 38–126)
Anion gap: 25 — ABNORMAL HIGH (ref 5–15)
BUN: 61 mg/dL — ABNORMAL HIGH (ref 8–23)
CO2: 10 mmol/L — ABNORMAL LOW (ref 22–32)
Calcium: 6.9 mg/dL — ABNORMAL LOW (ref 8.9–10.3)
Chloride: 109 mmol/L (ref 98–111)
Creatinine, Ser: 2.38 mg/dL — ABNORMAL HIGH (ref 0.44–1.00)
GFR calc Af Amer: 21 mL/min — ABNORMAL LOW (ref >60)
GFR calc non Af Amer: 18 mL/min — ABNORMAL LOW (ref >60)
Glucose, Bld: 79 mg/dL (ref 70–99)
Potassium: 5.4 mmol/L — ABNORMAL HIGH (ref 3.5–5.1)
Sodium: 144 mmol/L (ref 135–145)
Total Bilirubin: 1.8 mg/dL — ABNORMAL HIGH (ref 0.3–1.2)
Total Protein: 4.1 g/dL — ABNORMAL LOW (ref 6.5–8.1)

## 2019-11-04 LAB — URINE CULTURE: Culture: NO GROWTH

## 2019-11-04 LAB — LACTIC ACID, PLASMA
Lactic Acid, Venous: >11 mmol/L (ref 0.5–1.9)
Lactic Acid, Venous: >11 mmol/L (ref 0.5–1.9)

## 2019-11-04 LAB — BRAIN NATRIURETIC PEPTIDE: B Natriuretic Peptide: 900 pg/mL — ABNORMAL HIGH (ref 0.0–100.0)

## 2019-11-04 MED ORDER — SODIUM BICARBONATE 8.4 % IV SOLN
INTRAVENOUS | Status: AC
  Filled 2019-11-04: qty 100

## 2019-11-04 MED ORDER — STERILE WATER FOR INJECTION IV SOLN
INTRAVENOUS | Status: DC
  Administered 2019-11-04: 02:00:00 via INTRAVENOUS
  Filled 2019-11-04 (×8): qty 850

## 2019-11-04 MED ORDER — LACTATED RINGERS IV BOLUS
1000.0000 mL | Freq: Once | INTRAVENOUS | Status: AC
  Administered 2019-11-04: 1000 mL via INTRAVENOUS

## 2019-11-04 MED ORDER — NOREPINEPHRINE 4 MG/250ML-% IV SOLN
INTRAVENOUS | Status: AC
  Filled 2019-11-04: qty 250

## 2019-11-04 MED ORDER — ORAL CARE MOUTH RINSE: 15.0000 mL | OROMUCOSAL | Status: DC

## 2019-11-04 MED ORDER — CHLORHEXIDINE GLUCONATE CLOTH 2 % EX PADS: 6.0000 | MEDICATED_PAD | Freq: Every day | CUTANEOUS | Status: DC

## 2019-11-04 MED ORDER — CHLORHEXIDINE GLUCONATE 0.12% ORAL RINSE (MEDLINE KIT): 15.0000 mL | Freq: Two times a day (BID) | OROMUCOSAL | Status: DC

## 2019-11-04 MED ORDER — SODIUM BICARBONATE 8.4 % IV SOLN
100.0000 meq | Freq: Once | INTRAVENOUS | Status: AC
  Administered 2019-11-04: 100 meq via INTRAVENOUS

## 2019-11-04 MED ORDER — SODIUM BICARBONATE 8.4 % IV SOLN
INTRAVENOUS | Status: AC
  Filled 2019-11-04: qty 150

## 2019-11-05 LAB — URINE CULTURE: Culture: <10000 — AB

## 2019-11-05 NOTE — ED Provider Notes (Signed)
This note refers to encounter at ~2300 10/11/2019.  I was asked by admitting service to place central line.  Patient requiring pressor support.  Right internal jugular line was placed.    CENTRAL LINE Performed by: Virgel Manifold Consent: The procedure was performed in an emergent situation. Required items: required blood products, implants, devices, and special equipment available Patient identity confirmed: arm band and provided demographic data Time out: Immediately prior to procedure a "time out" was called to verify the correct patient, procedure, equipment, support staff and site/side marked as required. Indications: vascular access Anesthesia: local infiltration Local anesthetic: lidocaine 1% with epinephrine Anesthetic total: 3 ml Patient sedated: no Preparation: skin prepped with 2% chlorhexidine Skin prep agent dried: skin prep agent completely dried prior to procedure Sterile barriers: all five maximum sterile barriers used - cap, mask, sterile gown, sterile gloves, and large sterile sheet Hand hygiene: hand hygiene performed prior to central venous catheter insertion  Location details: I IJ  Catheter type: triple lumen Catheter size: 8 Fr Pre-procedure: landmarks identified Ultrasound guidance:  Yes Successful placement: yes Post-procedure: line sutured and dressing applied Assessment: blood return through all parts, free fluid flow, placement verified by x-ray and no pneumothorax on x-ray Patient tolerance: Patient tolerated the procedure well with no immediate complications.    Virgel Manifold, MD 11/05/19 [phone]

## 2019-11-06 ENCOUNTER — Ambulatory Visit: Payer: Medicare Other | Admitting: Family Medicine

## 2019-11-07 ENCOUNTER — Ambulatory Visit: Payer: Medicare Other | Admitting: General Surgery

## 2019-11-07 ENCOUNTER — Encounter: Payer: Medicare Other | Admitting: Internal Medicine

## 2019-11-07 DIAGNOSIS — Z515 Encounter for palliative care: Secondary | ICD-10-CM

## 2019-11-07 DIAGNOSIS — R6521 Severe sepsis with septic shock: Secondary | ICD-10-CM | POA: Diagnosis present

## 2019-11-07 DIAGNOSIS — E872 Acidosis, unspecified: Secondary | ICD-10-CM | POA: Diagnosis present

## 2019-11-07 DIAGNOSIS — I469 Cardiac arrest, cause unspecified: Secondary | ICD-10-CM

## 2019-11-07 DIAGNOSIS — D696 Thrombocytopenia, unspecified: Secondary | ICD-10-CM | POA: Diagnosis present

## 2019-11-07 DIAGNOSIS — E875 Hyperkalemia: Secondary | ICD-10-CM | POA: Diagnosis present

## 2019-11-07 DIAGNOSIS — A419 Sepsis, unspecified organism: Secondary | ICD-10-CM | POA: Diagnosis present

## 2019-11-07 DIAGNOSIS — N179 Acute kidney failure, unspecified: Secondary | ICD-10-CM

## 2019-11-07 DIAGNOSIS — R000 Tachycardia, unspecified: Secondary | ICD-10-CM | POA: Diagnosis present

## 2019-11-07 DIAGNOSIS — D649 Anemia, unspecified: Secondary | ICD-10-CM | POA: Diagnosis present

## 2019-11-07 DIAGNOSIS — K7200 Acute and subacute hepatic failure without coma: Secondary | ICD-10-CM | POA: Diagnosis present

## 2019-11-07 DIAGNOSIS — T68XXXA Hypothermia, initial encounter: Secondary | ICD-10-CM | POA: Diagnosis present

## 2019-11-07 DIAGNOSIS — Z9911 Dependence on respirator [ventilator] status: Secondary | ICD-10-CM

## 2019-11-07 MED FILL — Medication: Qty: 1 | Status: AC

## 2019-11-07 NOTE — Progress Notes (Signed)
Pt went asystole at 0528 with her family at the bedside. Time of death was called at 72.

## 2019-11-07 NOTE — Progress Notes (Addendum)
After MD had central line placed, patient became unresponsive with agonal breathing. Called Dr. Olevia Bowens to have him evaluate the patient. Confirmed that patient did not have a pulse, the EKG showed the patient was dual paced but no QRS. A code blue was called at 2359 and began chest compressions as Dr. Olevia Bowens was entering the room. Epi 1 mg was administered at 2359; chest compressions continued; EKG showed pacer spikes only. Bicarb 1 amp  IV given at 0002; EKG showed pacer spikes only. Another Epi 1 mg IV was given at 0003; EKG showed pacer spikes only. At 0008 EKG showed dual paced with QRS and patient had a pulse. Dr. Olevia Bowens gave verbal order to initiate levophed at 7.35mL/hr. Dr Olevia Bowens spoke with the patients son in law, who gave verbal consent to make patient a DNR after discussing with her daughters. Pt family on the way to be at the bedside.

## 2019-11-07 NOTE — Progress Notes (Signed)
CRITICAL VALUE ALERT  Critical Value:  [place] 7.073  Date & Time Notied:  11/28 0157  Provider Notified: Olevia Bowens  Orders Received/Actions taken: see orders

## 2019-11-07 NOTE — ED Provider Notes (Signed)
ED ATTENDING NOTE: I was called to the intensive care unit to evaluate and possibly intubate this patient as she apparently is developing worsening respiratory distress and becoming obtunded.  When I arrived to the room, patient was being bagged with bag valve mask and was showing little, if any response.  RSI was performed using 20 mg of etomidate, 150 mg of succinylcholine, and a #3 glide scope blade.  The cords were easily visualized and a 7.5 endotracheal tube was easily placed.  Tube placement was confirmed with direct visualization, end-tidal CO2, and auscultation over the stomach and lungs.  Post intubation film ordered.  I then excused myself from the room to return to urgent patients in the emergency department.     Veryl Speak, MD 11-29-2019 [phone]

## 2019-11-07 NOTE — Progress Notes (Addendum)
Pulled tube back 2cm per MD order.  Patient now at 92 @ the lip.  Will continue to monitor.

## 2019-11-07 NOTE — Procedures (Signed)
Extubation Procedure Note  Patient Details:   Name: [name] DOB: [birth year] MRN: [id number]   Airway Documentation:    Vent end date: 2019-11-22 Vent end time: 0540(Patient passed away)   Evaluation  O2 sats: transiently fell during during procedure Complications: Complications of patient expiring Patient did not tolerate procedure well. Bilateral Breath Sounds: Clear, Diminished  No Breath Sounds; Patient passed No  Kienna Moncada, [place] P 11-22-19, 5:41 AM

## 2019-11-07 NOTE — Progress Notes (Signed)
CRITICAL VALUE ALERT  Critical Value:  Lactic Acid >11  Date & Time Notied:  11/28 0130  Provider Notified: Olevia Bowens  Orders Received/Actions taken: awaiting any new orders

## 2019-11-07 NOTE — Progress Notes (Signed)
Received a call from the lab regarding patient's lab values.  Hgb was 12.9 now 9 Platelets were 127 now 47 Lactic is still greater than 11  Notified Dr. Olevia Bowens. Pt family still at bedside. She isn't alert and does not respond to pain. Will not track or follow commands. Still on bear hugger. Continuing to monitor patient.

## 2019-11-07 DEATH — deceased

## 2019-11-08 LAB — CULTURE, BLOOD (ROUTINE X 2)
Culture: NO GROWTH
Culture: NO GROWTH
Special Requests: ADEQUATE
Special Requests: ADEQUATE

## 2019-11-29 ENCOUNTER — Ambulatory Visit: Payer: Medicare Other | Admitting: Family Medicine

## 2019-12-08 NOTE — Death Summary Note (Addendum)
DEATH SUMMARY   Patient Details  Name: [name] MRN: [id number] DOB: July 10, 1931  Admission/Discharge Information   Admit Date:  2019-12-03  Date of Death: Date of Death: 2019-12-04  Time of Death: Time of Death: 0530  Length of Stay: 1  Referring Physician: [name], MD   Reason(s) for Hospitalization   Patient presented with AMS and weakness in the setting of recent hospitalizations for treatment of metabolic encephalopathy secondary to UTI and an exacerbation of acute on chronic diastolic CHF.  Diagnoses  Preliminary cause of death:  Secondary Diagnoses (including complications and co-morbidities):  Principal Problem:   Severe sepsis with septic shock (HCC) Active Problems:   Diastolic CHF, chronic (HCC)   Metabolic encephalopathy   Type 2 diabetes mellitus with stage 3b chronic kidney disease, with long-term current use of insulin (HCC)   Acute hypoxemic respiratory failure (HCC)   CAD (coronary artery disease)   History of recurrent UTIs   (HFpEF) heart failure with preserved ejection fraction (HCC)   Goals of care, counseling/discussion   HCAP (healthcare-associated pneumonia)   Metabolic acidosis   Asystole (HCC)   Hypothermia   Tachycardia with heart rate 121-140 beats per minute   Lactic acidosis   On mechanically assisted ventilation (HCC)   Thrombocytopenia (HCC)   Hyperkalemia   AKI (acute kidney injury) ([place])   Hypocalcemia   Liver failure, acute   Normocytic anemia   Comfort measures only status   Brief Hospital Course (including significant findings, care, treatment, and services provided and events leading to death)  [name] is a 86 y.o. year old female with a past medical history significant for controlled atrial fibrillation s/p PM, chronic diastolic CHF, CKD stage 3b, hypertension, poorly controlled diabetes mellitus (hemoglobin A1c 7.5%), coronary artery disease, and depression who was brought to the ED via EMS with reports of  altered mental status, and increasing weakness. In the ED, she became hypotensive, hypothermic and tachypneic, with CXR showing RML and RLL consolidation, consistent with HCAP given her 2 recent hospitalizations in October. She was initially given Rocephin and Azithromycin in the ED, then subsequently switched to IV Vancomycin and Cefepime to cover empirically for HCAP. Patient rapidly deteriorated despite aggressive IVF to address her persistent hypotension/developing shock. Lactic acid and creatinine were elevated at 4.5 and 2.16 respectively, and the patient had acute encephalopathy, consistent with end-organ damage. Despite aggressive IVF and IV boluses, the patient required initiation of pressor support for persistent hypotension/shock. A family discussion was held, with family wishing aggressive life support measures/placement of a central line. Despite maximal medical support, the patient's condition continued to decline, and she developed acute respiratory failure with agonal breathing requiring intubation/mechanical ventilation.  20 minutes post-intubation, the patient went into asystole and underwent CPR x 7-8 minutes. Pulse was restored after 3-1/2 amps of adrenaline and 1 amp of sodium bicarbonate. Family was updated on her deterioration/poor prognosis and they opted to pursue a DNR status with full comfort care.  She went back into asystole at 5:30 am with family at the bedside.   Pertinent Labs and Studies  Significant Diagnostic Studies Dg Chest 1 View  Result Date: 04-Dec-2019 CLINICAL DATA:  Central line placement, intubated EXAM: CHEST  1 VIEW COMPARISON:  2019/12/03 FINDINGS: Endotracheal tube is just into the right mainstem bronchus. Recommend retracting approximately 2-3 cm. Left central line tip is likely in the left innominate vein. No pneumothorax. Small right pleural effusion with right lower lobe atelectasis or infiltrate. No confluent opacity on the  left. Left pacer is unchanged.  Prior CABG. IMPRESSION: Endotracheal tube just into the right mainstem bronchus. Recommend retracting 2-3 cm. Left central line tip in the left innominate vein.  No pneumothorax. Continued right pleural effusion with right lower lobe atelectasis or infiltrate. These results will be called to the ordering clinician or representative by the Radiologist Assistant, and communication documented in the PACS or zVision Dashboard. Electronically Signed   By: Rolm Baptise M.D.   On: 2019-11-14 00:28   Dg Chest 2 View  Result Date: 10/26/2019 CLINICAL DATA:  Fall EXAM: CHEST - 2 VIEW COMPARISON:  October 06, 2019 FINDINGS: The heart size and mediastinal contours unchanged with mild cardiomegaly and aortic knob calcifications. There is small bilateral pleural effusions, right greater than left with hazy adjacent airspace opacity at both lung bases. This slight interval worsening in the interstitial opacities in the left lower lung. No acute osseous abnormality. Overlying median stroma sternotomy wires and left-sided pacemaker again noted. IMPRESSION: 1. Small bilateral pleural effusions with adjacent hazy airspace opacity which could be atelectasis and/or layering effusion which was seen on prior CT. 2. Interval slight worsening in the interstitial opacity at the left lung base which could be due to atelectasis and/or early infectious etiology. 3. Mild cardiomegaly. Electronically Signed   By: Prudencio Pair M.D.   On: 10/26/2019 13:26   Ct Head Wo Contrast  Result Date: 10/08/2019 CLINICAL DATA:  Altered mental status.  Weakness. EXAM: CT HEAD WITHOUT CONTRAST TECHNIQUE: Contiguous axial images were obtained from the base of the skull through the vertex without intravenous contrast. COMPARISON:  November 02, 2019 FINDINGS: Brain: No subdural, epidural, or subarachnoid hemorrhage are identified. Ventricles and sulci are stable. Chronic infarct is again seen in the right cerebellar hemisphere and the right occipital  lobe. White matter changes are scattered and stable. No acute cortical ischemia or infarct. No mass effect or midline shift. Basal cisterns are stable. Vascular: Calcified atherosclerosis is seen in the intracranial carotids. Skull: Normal. Negative for fracture or focal lesion. Sinuses/Orbits: Mucosal thickening is seen in the left maxillary sinus. Paranasal sinuses, mastoid air cells, and middle ears are otherwise well aerated. Other: None. IMPRESSION: 1. Chronic infarcts in the right cerebellar hemisphere and the right occipital lobe. No acute intracranial abnormalities identified. 2. Mucosal thickening in the left maxillary sinus, stable. Electronically Signed   By: Dorise Bullion III M.D   On: 10/19/2019 15:38   Ct Head Wo Contrast  Result Date: 11/02/2019 CLINICAL DATA:  Altered level of consciousness (LOC), unexplained. Additional history provided by patient's daughter: New onset leaning to the right in today with extremity edema. EXAM: CT HEAD WITHOUT CONTRAST TECHNIQUE: Contiguous axial images were obtained from the base of the skull through the vertex without intravenous contrast. COMPARISON:  Head CT 10/26/2019 FINDINGS: Brain: No evidence of acute intracranial hemorrhage. Redemonstrated chronic cortically based infarct within the right occipital lobe and chronic right cerebellar infarct. No new demarcated infarct is identified. No evidence of intracranial mass. No midline shift or extra-axial fluid collection. Advanced patchy and confluent hypodensity within cerebral white matter is nonspecific, but consistent with chronic small vessel ischemic disease. Moderate generalized parenchymal atrophy Vascular: No hyperdense vessel.  Atherosclerotic calcifications. Skull: Normal. Negative for fracture or focal lesion. Sinuses/Orbits: Visualized orbits demonstrate no acute abnormality. Mucosal thickening within the partially imaged maxillary sinuses. No significant mastoid effusion. IMPRESSION: 1. No  evidence of acute intracranial abnormality. 2. Redemonstrated chronic right occipital lobe and right cerebellar infarcts. 3. Background of generalized  parenchymal atrophy with advanced chronic small vessel ischemic disease. Electronically Signed   By: Kellie Simmering DO   On: 11/02/2019 18:49   Ct Head Wo Contrast  Result Date: 10/26/2019 CLINICAL DATA:  Fall EXAM: CT HEAD WITHOUT CONTRAST TECHNIQUE: Contiguous axial images were obtained from the base of the skull through the vertex without intravenous contrast. COMPARISON:  October 03, 2019 FINDINGS: Brain: There is no acute intracranial hemorrhage, mass-effect, or edema. Gray-white differentiation is preserved. There is no extra-axial fluid collection. Prominence of the ventricles and sulci reflects stable parenchymal volume loss. Chronic right occipital and cerebellar infarctions again seen. Confluent areas of hypoattenuation in the supratentorial white matter likely reflects stable chronic microvascular ischemic changes. Vascular: There is atherosclerotic calcification at the skull base. Skull: Calvarium is unremarkable. Sinuses/Orbits: Stable partially imaged inflammatory changes of the maxillary sinuses. No acute orbital abnormality. Other: Mastoid air cells are clear. IMPRESSION: No evidence of acute intracranial injury. Stable chronic findings detailed above. Electronically Signed   By: Macy Mis M.D.   On: 10/26/2019 13:53   Ct Cervical Spine Wo Contrast  Result Date: 10/26/2019 CLINICAL DATA:  Fall EXAM: CT CERVICAL SPINE WITHOUT CONTRAST TECHNIQUE: Multidetector CT imaging of the cervical spine was performed without intravenous contrast. Multiplanar CT image reconstructions were also generated. COMPARISON:  None. FINDINGS: Alignment: Straightening of the cervical lordosis. Anteroposterior alignment is maintained Skull base and vertebrae: No acute fracture. Vertebral body heights are maintained. Soft tissues and spinal canal: No prevertebral  fluid or swelling. No visible canal hematoma. Disc levels: Multilevel degenerative changes are present including disc height loss, disc protrusions, and facet and uncovertebral hypertrophy. There is no high-grade osseous encroachment on the spinal canal. Upper chest: Partially imaged moderate right pleural effusion. Other: Calcified plaque at the common carotid bifurcations. IMPRESSION: No acute cervical spine fracture. Partially imaged moderate right pleural effusion also present on October 06, 2019 chest CT. Electronically Signed   By: Macy Mis M.D.   On: 10/26/2019 13:48   Dg Chest Port 1 View  Result Date: 10/12/2019 CLINICAL DATA:  Weakness EXAM: PORTABLE CHEST 1 VIEW COMPARISON:  October 26, 2019 FINDINGS: There is a right pleural effusion with consolidation in portions of the right middle and lower lobes. There is scattered atelectatic change elsewhere bilaterally. Heart is upper normal in size with mild pulmonary venous hypertension. Patient is status post coronary artery bypass grafting. Pacemaker lead tips are attached to the right atrium and right ventricle. There is aortic atherosclerosis. No adenopathy. There is degenerative change in each shoulder. IMPRESSION: 1. Right pleural effusion with consolidation in portions of the right middle and lower lobes. Suspect right base region pneumonia. 2. Scattered atelectatic change bilaterally. 3. Mild pulmonary venous hypertension. 4. Status post coronary artery bypass grafting. Pacemaker lead tips attached to right atrium and right ventricle. 5.  Aortic Atherosclerosis (ICD10-I70.0). Electronically Signed   By: Lowella Grip III M.D.   On: 11/05/2019 14:03   Dg Hip Unilat With Pelvis 2-3 Views Right  Result Date: 10/26/2019 CLINICAL DATA:  Unwitnessed fall EXAM: DG HIP (WITH OR WITHOUT PELVIS) 2-3V RIGHT COMPARISON:  June 28, 2019 FINDINGS: There is no evidence of hip fracture or dislocation. There is diffuse osteopenia which somewhat limits  evaluation. Dense vascular calcifications are noted. Degenerative changes in the lower lumbar spine. There is no evidence of arthropathy or other focal bone abnormality. IMPRESSION: No definite acute osseous abnormality, however somewhat limited due to diffuse osteopenia. Electronically Signed   By: Ebony Cargo.D.  On: 10/26/2019 13:24    Microbiology Recent Results (from the past 240 hour(s))  Urine culture     Status: None   Collection Time: 11/02/19  6:50 PM   Specimen: Urine, Catheterized  Result Value Ref Range Status   Specimen Description   Final    URINE, CATHETERIZED Performed at [facility], [address], [place], [place] [address]    Special Requests   Final    NONE Performed at [facility], [address], [place], [place] [address]    Culture   Final    NO GROWTH Performed at [facility], [place] [address], [place], [place] [address]    Report Status November 11, 2019 FINAL  Final  Culture, Urine     Status: Abnormal   Collection Time: 10/13/2019  1:48 PM   Specimen: Urine, Clean Catch  Result Value Ref Range Status   Specimen Description   Final    URINE, CLEAN CATCH Performed at [facility], [address], [place], [place] [address]    Special Requests   Final    NONE Performed at [facility], [address], [place], [place] [address]    Culture (A)  Final    <10,000 COLONIES/mL INSIGNIFICANT GROWTH Performed at [place] [address] [place], [place] [address]    Report Status 11/05/2019 FINAL  Final  SARS Coronavirus 2 by RT PCR (hospital order, performed in [facility] hospital lab) Nasopharyngeal Nasopharyngeal Swab     Status: None   Collection Time: 10/23/2019  3:00 PM   Specimen: Nasopharyngeal Swab  Result Value Ref Range Status   SARS Coronavirus 2 NEGATIVE NEGATIVE Final    Comment: (NOTE) SARS-CoV-2 target nucleic acids are NOT DETECTED. The SARS-CoV-2 RNA is generally detectable in upper and lower respiratory specimens during the  acute phase of infection. The lowest concentration of SARS-CoV-2 viral copies this assay can detect is 250 copies / mL. A negative result does not preclude SARS-CoV-2 infection and should not be used as the sole basis for treatment or other patient management decisions.  A negative result may occur with improper specimen collection / handling, submission of specimen other than nasopharyngeal swab, presence of viral mutation(s) within the areas targeted by this assay, and inadequate number of viral copies (<250 copies / mL). A negative result must be combined with clinical observations, patient history, and epidemiological information. Fact Sheet for Patients:   StrictlyIdeas.no Fact Sheet for Healthcare Providers: BankingDealers.co.za This test is not yet approved or cleared  by the [place] FDA and has been authorized for detection and/or diagnosis of SARS-CoV-2 by FDA under an Emergency Use Authorization (EUA).  This EUA will remain in effect (meaning this test can be used) for the duration of the COVID-19 declaration under Section 564(b)(1) of the Act, 21 U.S.C. section 360bbb-3(b)(1), unless the authorization is terminated or revoked sooner. Performed at [facility], [address], [place], [place] [address]   Culture, blood (routine x 2)     Status: None (Preliminary result)   Collection Time: 10/11/2019  6:08 PM   Specimen: Left Antecubital; Blood  Result Value Ref Range Status   Specimen Description LEFT ANTECUBITAL  Final   Special Requests   Final    BOTTLES DRAWN AEROBIC AND ANAEROBIC Blood Culture adequate volume   Culture   Final    NO GROWTH 4 DAYS Performed at [facility], [address], [place], [place] [address]    Report Status PENDING  Incomplete  Culture,  blood (routine x 2)     Status: None (Preliminary result)   Collection Time: 10/24/2019  6:16 PM   Specimen: BLOOD LEFT HAND  Result Value Ref Range Status    Specimen Description BLOOD LEFT HAND  Final   Special Requests   Final    BOTTLES DRAWN AEROBIC ONLY Blood Culture adequate volume   Culture   Final    NO GROWTH 4 DAYS Performed at [facility], [address] [place], [place] [address]    Report Status PENDING  Incomplete    Lab Basic Metabolic Panel: Recent Labs  Lab 11/02/19 1716 11/02/2019 1403 10/29/2019 1808 05-Nov-2019 0341  NA 138 138  --  144  K 3.4* 3.9  --  5.4*  CL 100 98  --  109  CO2 25 26  --  10*  GLUCOSE 229* 203*  --  79  BUN 68* 64*  --  61*  CREATININE 2.40* 2.16*  --  2.38*  CALCIUM 8.7* 9.0  --  6.9*  MG  --   --  1.9  --    Liver Function Tests: Recent Labs  Lab 10/17/2019 1403 11-05-19 0341  AST 23 3,432*  ALT 23 2,114*  ALKPHOS 51 91  BILITOT 0.8 1.8*  PROT 6.3* 4.1*  ALBUMIN 3.3* 2.0*   No results for input(s): LIPASE, AMYLASE in the last 168 hours. No results for input(s): AMMONIA in the last 168 hours. CBC: Recent Labs  Lab 11/02/19 1716 10/30/2019 1403 11/05/2019 0341  WBC 5.1 4.8 10.5  NEUTROABS 4.0 4.0  --   HGB 11.7* 12.9 9.0*  HCT 38.4 42.7 32.4*  MCV 90.4 91.2 99.4  PLT 133* 127* 47*   Cardiac Enzymes: No results for input(s): CKTOTAL, CKMB, CKMBINDEX, TROPONINI in the last 168 hours. Sepsis Labs: Recent Labs  Lab 11/02/19 1716 10/28/2019 1403 11/03/19 1808 2019-11-05 0055 2019/11/05 0341  WBC 5.1 4.8  --   --  10.5  LATICACIDVEN  --   --  4.5* >11.0* >11.0*     Kwabena Strutz E Jayzen Paver 11/07/2019, 1:40 PM

## 2019-12-27 ENCOUNTER — Ambulatory Visit: Payer: Medicare Other | Admitting: Cardiology

## 2020-02-08 IMAGING — CT CT HEAD W/O CM
3 series · 16 of 47 positions shown, 19 images · non-contrast
Comparison: November 02, 2019

CLINICAL DATA: Altered mental status.  Weakness.

EXAM:
CT HEAD WITHOUT CONTRAST
TECHNIQUE: Contiguous axial images were obtained from the base of the skull
through the vertex without intravenous contrast.

[Series 2: head w o · axial · 0.46mm/px · z∈[+9,+134]mm · 10 of 31 slices shown, 13 images]
[im 3/31  brain]
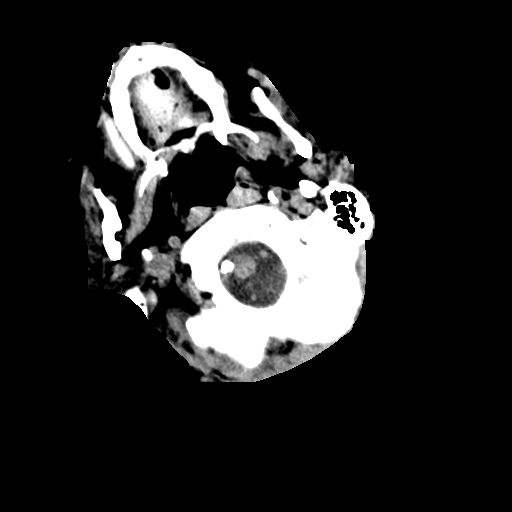
[im 3/31  bone]
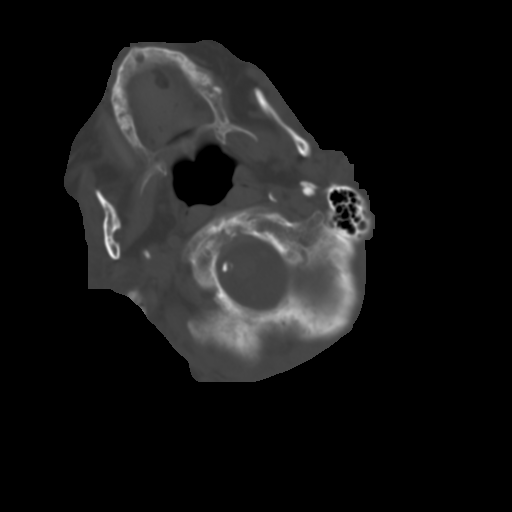
[im 6/31  brain]
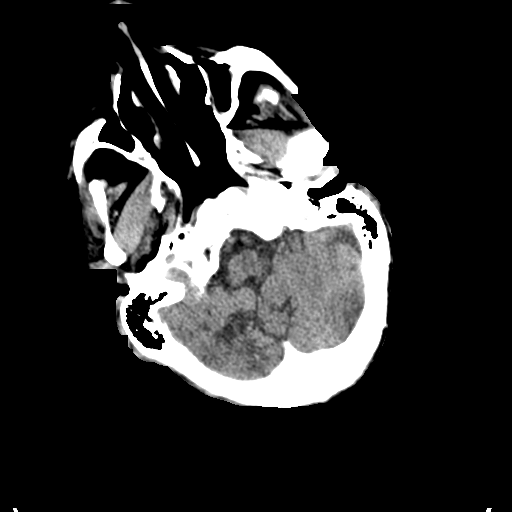
[im 9/31  brain]
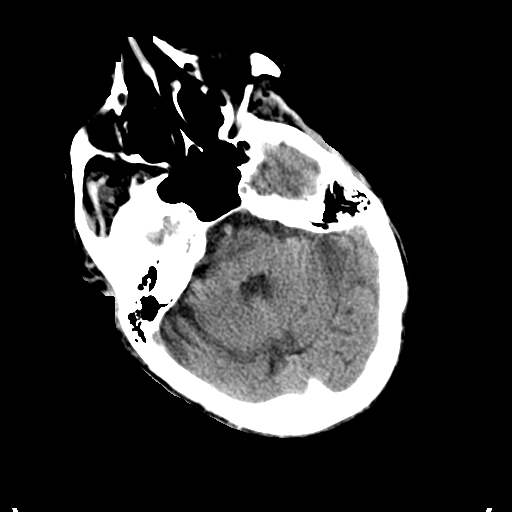
[im 11/31  brain]
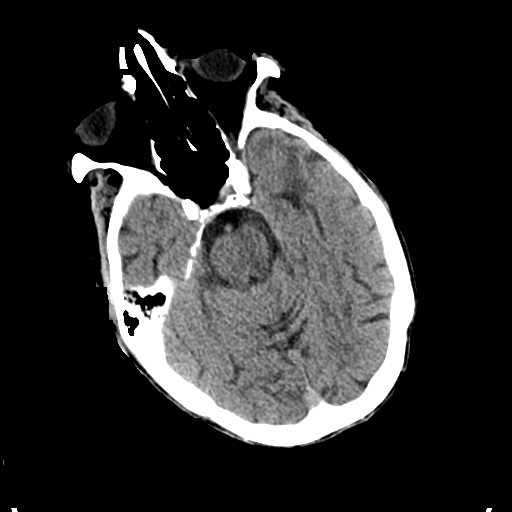
[im 14/31  brain]
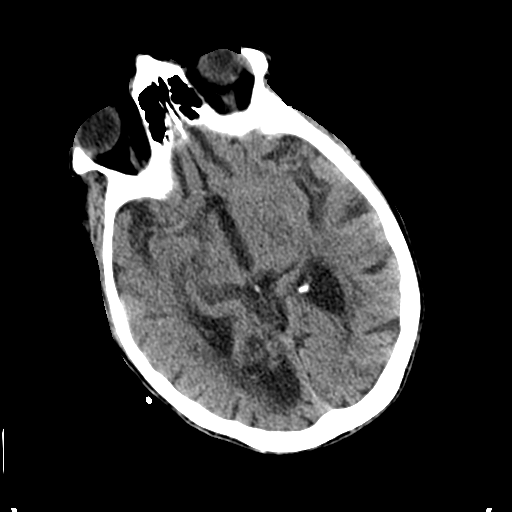
[im 14/31  bone]
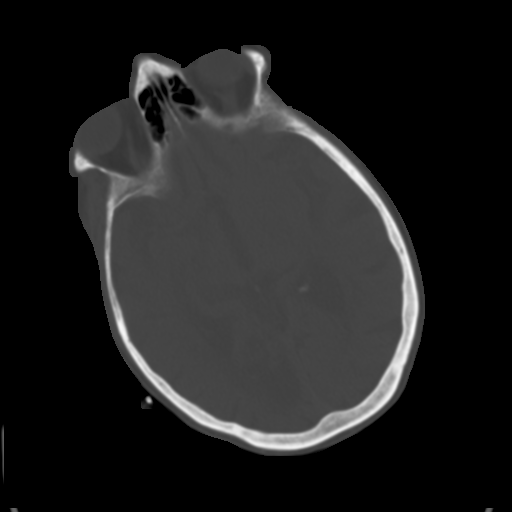
[im 17/31  brain]
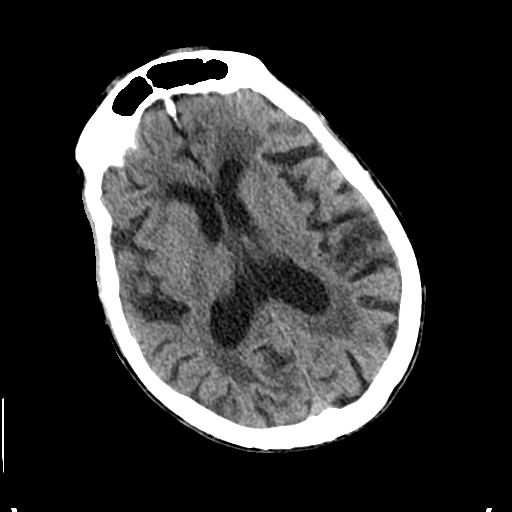
[im 20/31  brain]
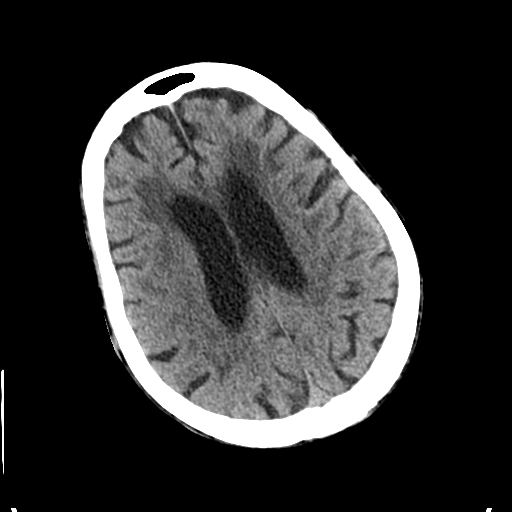
[im 23/31  brain]
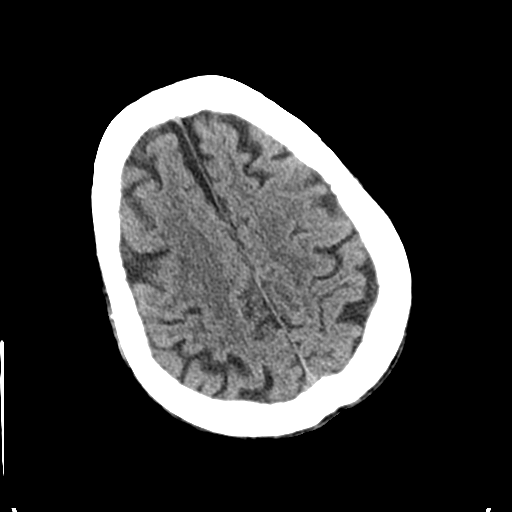
[im 25/31  brain]
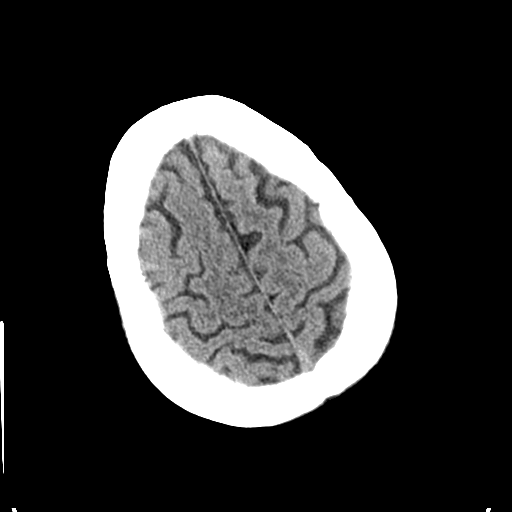
[im 25/31  bone]
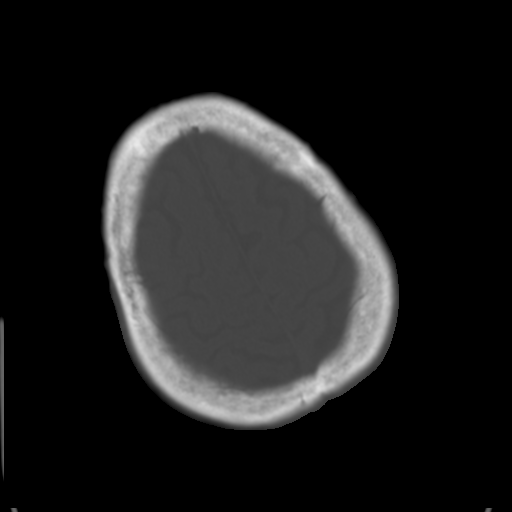
[im 28/31  brain]
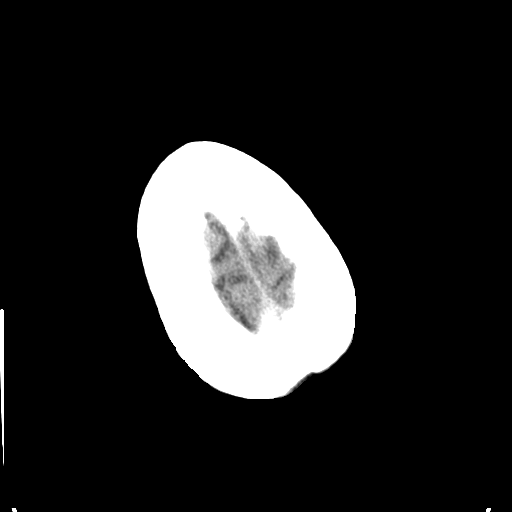

[Series 4: coronal soft · coronal · 0.33mm/px · 3 of 74 slices shown]
[im 25/74  brain]
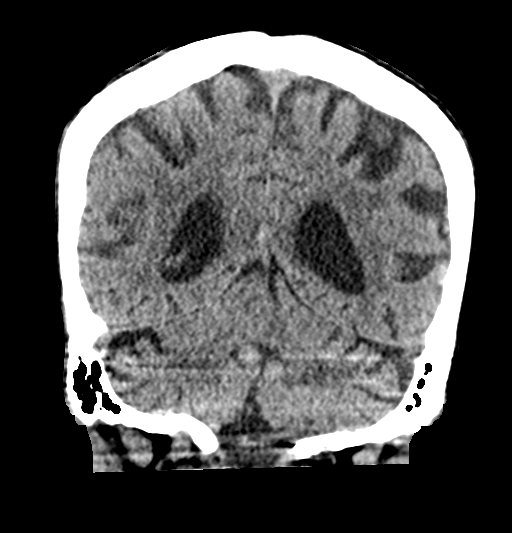
[im 33/74  brain]
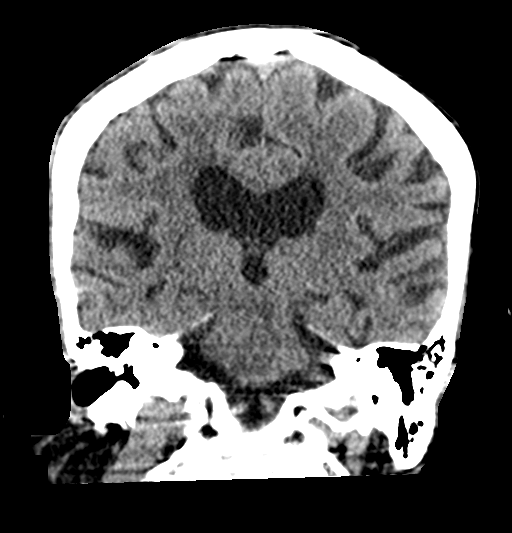
[im 41/74  brain]
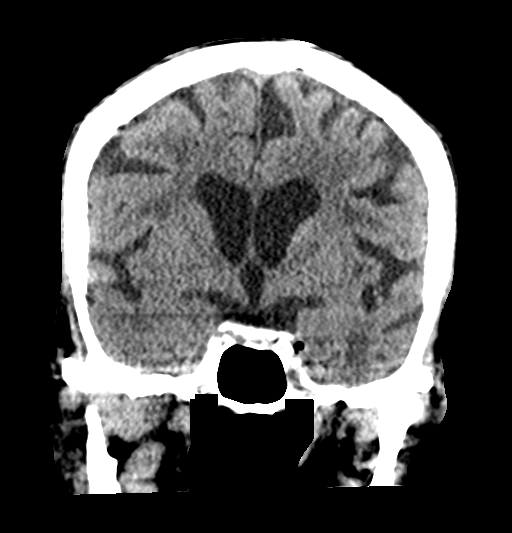

[Series 5: sagittal soft · sagittal · 0.36mm/px · 3 of 55 slices shown]
[im 19/55  brain]
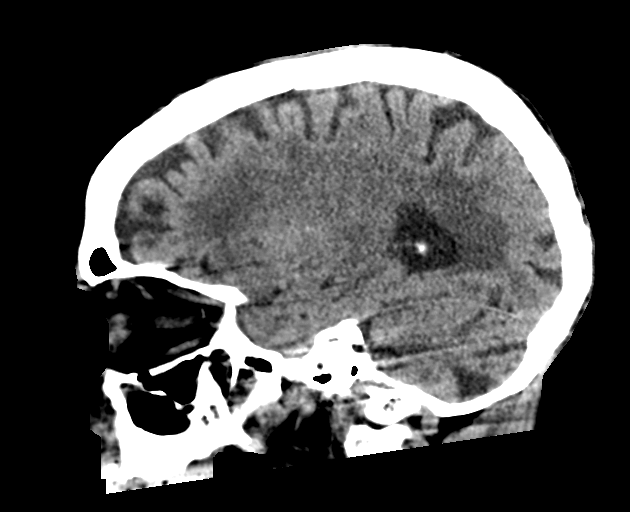
[im 28/55  brain]
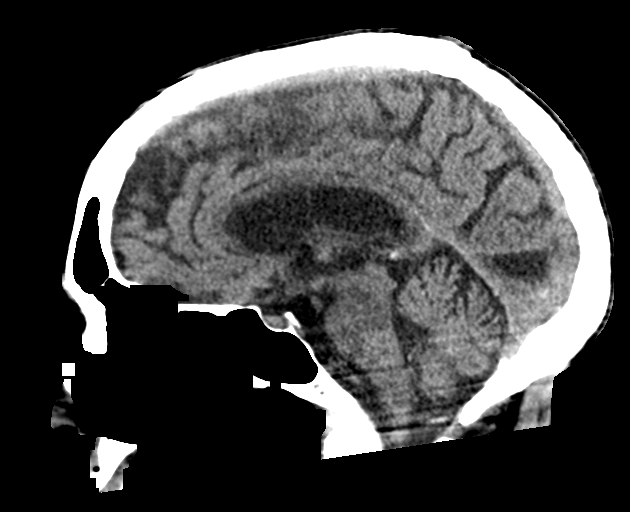
[im 37/55  brain]
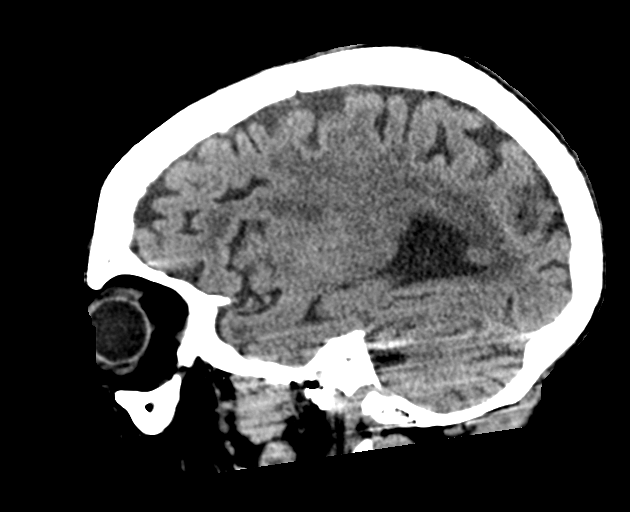

[16 of 47 positions shown; findings below may reference images not displayed]

FINDINGS: Brain: No subdural, epidural, or subarachnoid hemorrhage are
identified. Ventricles and sulci are stable. Chronic infarct is
again seen in the right cerebellar hemisphere and the right
occipital lobe. White matter changes are scattered and stable. No
acute cortical ischemia or infarct. No mass effect or midline shift.
Basal cisterns are stable.

Vascular: Calcified atherosclerosis is seen in the intracranial
carotids.

Skull: Normal. Negative for fracture or focal lesion.

Sinuses/Orbits: Mucosal thickening is seen in the left maxillary
sinus. Paranasal sinuses, mastoid air cells, and middle ears are
otherwise well aerated.

Other: None.
IMPRESSION: 1. Chronic infarcts in the right cerebellar hemisphere and the right
occipital lobe. No acute intracranial abnormalities identified.
2. Mucosal thickening in the left maxillary sinus, stable.

## 2020-02-19 ENCOUNTER — Ambulatory Visit: Payer: Medicare Other | Admitting: Cardiology

## 2020-02-21 ENCOUNTER — Ambulatory Visit: Payer: Medicare Other | Admitting: Cardiology
